# Patient Record
Sex: Male | Born: 1945
Health system: Southern US, Community
[De-identification: ages and names within clinical notes are randomized; demographics above are authoritative.]

## PROBLEM LIST (undated history)

## (undated) DIAGNOSIS — R0609 Other forms of dyspnea: Secondary | ICD-10-CM

## (undated) DIAGNOSIS — R011 Cardiac murmur, unspecified: Secondary | ICD-10-CM

## (undated) DIAGNOSIS — Z87442 Personal history of urinary calculi: Secondary | ICD-10-CM

## (undated) DIAGNOSIS — M1990 Unspecified osteoarthritis, unspecified site: Secondary | ICD-10-CM

## (undated) DIAGNOSIS — Z974 Presence of external hearing-aid: Secondary | ICD-10-CM

## (undated) DIAGNOSIS — R350 Frequency of micturition: Secondary | ICD-10-CM

## (undated) DIAGNOSIS — J849 Interstitial pulmonary disease, unspecified: Secondary | ICD-10-CM

## (undated) DIAGNOSIS — K5730 Diverticulosis of large intestine without perforation or abscess without bleeding: Secondary | ICD-10-CM

## (undated) DIAGNOSIS — Z9861 Coronary angioplasty status: Secondary | ICD-10-CM

## (undated) DIAGNOSIS — R3915 Urgency of urination: Secondary | ICD-10-CM

## (undated) DIAGNOSIS — K219 Gastro-esophageal reflux disease without esophagitis: Secondary | ICD-10-CM

## (undated) DIAGNOSIS — J984 Other disorders of lung: Secondary | ICD-10-CM

## (undated) DIAGNOSIS — I480 Paroxysmal atrial fibrillation: Secondary | ICD-10-CM

## (undated) DIAGNOSIS — D509 Iron deficiency anemia, unspecified: Secondary | ICD-10-CM

## (undated) DIAGNOSIS — Z9889 Other specified postprocedural states: Secondary | ICD-10-CM

## (undated) DIAGNOSIS — N201 Calculus of ureter: Secondary | ICD-10-CM

## (undated) DIAGNOSIS — G4733 Obstructive sleep apnea (adult) (pediatric): Secondary | ICD-10-CM

## (undated) DIAGNOSIS — Z8679 Personal history of other diseases of the circulatory system: Secondary | ICD-10-CM

## (undated) DIAGNOSIS — R112 Nausea with vomiting, unspecified: Secondary | ICD-10-CM

## (undated) DIAGNOSIS — Z86010 Personal history of colonic polyps: Secondary | ICD-10-CM

## (undated) DIAGNOSIS — I2510 Atherosclerotic heart disease of native coronary artery without angina pectoris: Secondary | ICD-10-CM

## (undated) DIAGNOSIS — E785 Hyperlipidemia, unspecified: Secondary | ICD-10-CM

## (undated) DIAGNOSIS — R0600 Dyspnea, unspecified: Secondary | ICD-10-CM

## (undated) DIAGNOSIS — Z955 Presence of coronary angioplasty implant and graft: Secondary | ICD-10-CM

## (undated) DIAGNOSIS — Z860101 Personal history of adenomatous and serrated colon polyps: Secondary | ICD-10-CM

## (undated) DIAGNOSIS — J302 Other seasonal allergic rhinitis: Secondary | ICD-10-CM

## (undated) DIAGNOSIS — I10 Essential (primary) hypertension: Secondary | ICD-10-CM

## (undated) DIAGNOSIS — Z87820 Personal history of traumatic brain injury: Secondary | ICD-10-CM

## (undated) DIAGNOSIS — L409 Psoriasis, unspecified: Secondary | ICD-10-CM

## (undated) DIAGNOSIS — K449 Diaphragmatic hernia without obstruction or gangrene: Secondary | ICD-10-CM

## (undated) DIAGNOSIS — H35379 Puckering of macula, unspecified eye: Secondary | ICD-10-CM

## (undated) DIAGNOSIS — H04123 Dry eye syndrome of bilateral lacrimal glands: Secondary | ICD-10-CM

## (undated) DIAGNOSIS — N200 Calculus of kidney: Secondary | ICD-10-CM

## (undated) DIAGNOSIS — I5032 Chronic diastolic (congestive) heart failure: Secondary | ICD-10-CM

## (undated) DIAGNOSIS — Z85828 Personal history of other malignant neoplasm of skin: Secondary | ICD-10-CM

## (undated) DIAGNOSIS — R413 Other amnesia: Secondary | ICD-10-CM

## (undated) DIAGNOSIS — E119 Type 2 diabetes mellitus without complications: Secondary | ICD-10-CM

## (undated) HISTORY — DX: Paroxysmal atrial fibrillation: I48.0

## (undated) HISTORY — DX: Coronary angioplasty status: Z98.61

## (undated) HISTORY — PX: CARPAL TUNNEL RELEASE: SHX101

## (undated) HISTORY — DX: Unspecified osteoarthritis, unspecified site: M19.90

## (undated) HISTORY — DX: Essential (primary) hypertension: I10

## (undated) HISTORY — PX: CARDIAC CATHETERIZATION: SHX172

## (undated) HISTORY — DX: Hyperlipidemia, unspecified: E78.5

## (undated) HISTORY — PX: NASAL SINUS SURGERY: SHX719

## (undated) HISTORY — DX: Atherosclerotic heart disease of native coronary artery without angina pectoris: I25.10

## (undated) HISTORY — DX: Psoriasis, unspecified: L40.9

## (undated) HISTORY — PX: CATARACT EXTRACTION W/ INTRAOCULAR LENS  IMPLANT, BILATERAL: SHX1307

## (undated) HISTORY — DX: Diaphragmatic hernia without obstruction or gangrene: K44.9

## (undated) HISTORY — PX: ROTATOR CUFF REPAIR: SHX139

## (undated) HISTORY — DX: Gastro-esophageal reflux disease without esophagitis: K21.9

## (undated) HISTORY — DX: Dyspnea, unspecified: R06.00

## (undated) HISTORY — PX: CARDIOVASCULAR STRESS TEST: SHX262

## (undated) HISTORY — DX: Cardiac murmur, unspecified: R01.1

## (undated) HISTORY — PX: OTHER SURGICAL HISTORY: SHX169

## (undated) HISTORY — PX: TONSILLECTOMY: SUR1361

## (undated) HISTORY — PX: PROXIMAL INTERPHALANGEAL FUSION (PIP): SHX6043

---
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
## 1967-04-09 DIAGNOSIS — L400 Psoriasis vulgaris: Secondary | ICD-10-CM

## 1967-04-09 HISTORY — DX: Psoriasis vulgaris: L40.0

## 1994-04-08 HISTORY — PX: APPENDECTOMY: SHX54

## 1997-04-08 DIAGNOSIS — K449 Diaphragmatic hernia without obstruction or gangrene: Secondary | ICD-10-CM

## 1997-04-08 HISTORY — DX: Diaphragmatic hernia without obstruction or gangrene: K44.9

## 2001-01-05 ENCOUNTER — Ambulatory Visit (HOSPITAL_BASED_OUTPATIENT_CLINIC_OR_DEPARTMENT_OTHER): Admission: RE | Admit: 2001-01-05 | Discharge: 2001-01-05 | Payer: Self-pay | Admitting: Orthopedic Surgery

## 2001-02-26 ENCOUNTER — Ambulatory Visit (HOSPITAL_COMMUNITY): Admission: RE | Admit: 2001-02-26 | Discharge: 2001-02-26 | Payer: Self-pay | Admitting: Orthopedic Surgery

## 2001-02-27 ENCOUNTER — Emergency Department (HOSPITAL_COMMUNITY): Admission: EM | Admit: 2001-02-27 | Discharge: 2001-02-27 | Payer: Self-pay | Admitting: Emergency Medicine

## 2001-02-27 ENCOUNTER — Encounter: Payer: Self-pay | Admitting: Emergency Medicine

## 2003-06-30 ENCOUNTER — Ambulatory Visit (HOSPITAL_COMMUNITY): Admission: RE | Admit: 2003-06-30 | Discharge: 2003-06-30 | Payer: Self-pay | Admitting: Internal Medicine

## 2003-06-30 HISTORY — PX: LEFT HEART CATH AND CORONARY ANGIOGRAPHY: CATH118249

## 2003-11-07 ENCOUNTER — Inpatient Hospital Stay (HOSPITAL_COMMUNITY): Admission: AD | Admit: 2003-11-07 | Discharge: 2003-11-10 | Payer: Self-pay | Admitting: Cardiology

## 2004-02-09 ENCOUNTER — Ambulatory Visit: Payer: Self-pay | Admitting: Cardiology

## 2004-02-15 ENCOUNTER — Ambulatory Visit: Payer: Self-pay | Admitting: Family Medicine

## 2004-02-24 ENCOUNTER — Ambulatory Visit: Payer: Self-pay | Admitting: Cardiology

## 2004-03-26 ENCOUNTER — Ambulatory Visit: Payer: Self-pay | Admitting: Cardiology

## 2004-04-06 ENCOUNTER — Ambulatory Visit: Payer: Self-pay | Admitting: Family Medicine

## 2004-04-23 ENCOUNTER — Ambulatory Visit: Payer: Self-pay | Admitting: *Deleted

## 2004-06-29 ENCOUNTER — Ambulatory Visit: Payer: Self-pay | Admitting: Cardiology

## 2004-07-27 ENCOUNTER — Ambulatory Visit: Payer: Self-pay | Admitting: Cardiology

## 2004-08-02 ENCOUNTER — Ambulatory Visit: Payer: Self-pay | Admitting: Cardiology

## 2004-08-06 HISTORY — PX: ATRIAL FIBRILLATION ABLATION: EP1191

## 2004-08-10 ENCOUNTER — Ambulatory Visit: Payer: Self-pay | Admitting: Cardiovascular Disease

## 2004-08-17 ENCOUNTER — Ambulatory Visit: Payer: Self-pay | Admitting: Cardiology

## 2004-08-20 ENCOUNTER — Ambulatory Visit: Payer: Self-pay | Admitting: Cardiology

## 2004-08-22 ENCOUNTER — Ambulatory Visit: Payer: Self-pay | Admitting: Cardiology

## 2004-08-29 ENCOUNTER — Ambulatory Visit: Payer: Self-pay

## 2004-08-29 ENCOUNTER — Ambulatory Visit: Payer: Self-pay | Admitting: *Deleted

## 2004-09-19 ENCOUNTER — Ambulatory Visit: Payer: Self-pay | Admitting: *Deleted

## 2004-10-17 ENCOUNTER — Ambulatory Visit: Payer: Self-pay | Admitting: Cardiology

## 2004-11-07 ENCOUNTER — Ambulatory Visit: Payer: Self-pay | Admitting: Cardiovascular Disease

## 2004-11-21 ENCOUNTER — Ambulatory Visit: Payer: Self-pay | Admitting: Cardiology

## 2004-11-26 ENCOUNTER — Ambulatory Visit: Payer: Self-pay | Admitting: Internal Medicine

## 2004-12-17 ENCOUNTER — Ambulatory Visit: Payer: Self-pay | Admitting: Internal Medicine

## 2005-03-04 ENCOUNTER — Ambulatory Visit: Payer: Self-pay | Admitting: Gastroenterology

## 2005-03-19 ENCOUNTER — Encounter (INDEPENDENT_AMBULATORY_CARE_PROVIDER_SITE_OTHER): Payer: Self-pay | Admitting: *Deleted

## 2005-03-19 ENCOUNTER — Ambulatory Visit: Payer: Self-pay | Admitting: Gastroenterology

## 2006-03-18 ENCOUNTER — Ambulatory Visit: Payer: Self-pay | Admitting: Internal Medicine

## 2006-04-22 ENCOUNTER — Encounter: Admission: RE | Admit: 2006-04-22 | Discharge: 2006-04-22 | Payer: Self-pay | Admitting: Internal Medicine

## 2006-04-22 ENCOUNTER — Ambulatory Visit: Payer: Self-pay | Admitting: Internal Medicine

## 2006-04-22 LAB — CONVERTED CEMR LAB
AST: 22 units/L (ref 0–37)
BUN: 14 mg/dL (ref 6–23)
Basophils Absolute: 0.1 10*3/uL (ref 0.0–0.1)
CO2: 27 meq/L (ref 19–32)
Calcium: 9.6 mg/dL (ref 8.4–10.5)
Chloride: 101 meq/L (ref 96–112)
Creatinine, Ser: 0.9 mg/dL (ref 0.4–1.5)
Eosinophils Relative: 3.3 % (ref 0.0–5.0)
HCT: 42 % (ref 39.0–52.0)
Hemoglobin: 14 g/dL (ref 13.0–17.0)
Hgb A1c MFr Bld: 5.8 % (ref 4.6–6.0)
Lymphocytes Relative: 19.7 % (ref 12.0–46.0)
MCHC: 33.4 g/dL (ref 30.0–36.0)
MCV: 83.9 fL (ref 78.0–100.0)
Monocytes Relative: 6.9 % (ref 3.0–11.0)
Neutro Abs: 4 10*3/uL (ref 1.4–7.7)
Neutrophils Relative %: 69.1 % (ref 43.0–77.0)
RDW: 13 % (ref 11.5–14.6)
TSH: 1.3 microintl units/mL (ref 0.35–5.50)

## 2006-11-25 ENCOUNTER — Ambulatory Visit: Payer: Self-pay | Admitting: Cardiology

## 2006-12-03 ENCOUNTER — Ambulatory Visit: Payer: Self-pay

## 2006-12-03 ENCOUNTER — Encounter: Payer: Self-pay | Admitting: Internal Medicine

## 2007-06-29 ENCOUNTER — Ambulatory Visit: Payer: Self-pay | Admitting: Family Medicine

## 2007-06-29 DIAGNOSIS — J1100 Influenza due to unidentified influenza virus with unspecified type of pneumonia: Secondary | ICD-10-CM

## 2007-06-29 LAB — CONVERTED CEMR LAB: Inflenza A Ag: POSITIVE

## 2007-06-30 ENCOUNTER — Ambulatory Visit: Payer: Self-pay | Admitting: Family Medicine

## 2007-07-06 ENCOUNTER — Ambulatory Visit: Payer: Self-pay | Admitting: Family Medicine

## 2007-07-06 DIAGNOSIS — R0989 Other specified symptoms and signs involving the circulatory and respiratory systems: Secondary | ICD-10-CM

## 2007-07-06 DIAGNOSIS — I480 Paroxysmal atrial fibrillation: Secondary | ICD-10-CM | POA: Insufficient documentation

## 2007-07-06 DIAGNOSIS — I4891 Unspecified atrial fibrillation: Secondary | ICD-10-CM

## 2007-07-06 DIAGNOSIS — R0609 Other forms of dyspnea: Secondary | ICD-10-CM

## 2007-07-10 ENCOUNTER — Encounter (INDEPENDENT_AMBULATORY_CARE_PROVIDER_SITE_OTHER): Payer: Self-pay | Admitting: *Deleted

## 2007-07-15 ENCOUNTER — Ambulatory Visit: Payer: Self-pay | Admitting: Internal Medicine

## 2007-07-15 DIAGNOSIS — J45909 Unspecified asthma, uncomplicated: Secondary | ICD-10-CM | POA: Insufficient documentation

## 2007-07-23 DIAGNOSIS — R0602 Shortness of breath: Secondary | ICD-10-CM | POA: Insufficient documentation

## 2007-07-24 ENCOUNTER — Ambulatory Visit: Payer: Self-pay | Admitting: Pulmonary Disease

## 2007-07-24 DIAGNOSIS — J309 Allergic rhinitis, unspecified: Secondary | ICD-10-CM

## 2007-07-27 LAB — CONVERTED CEMR LAB
Basophils Relative: 0.5 % (ref 0.0–1.0)
Eosinophils Relative: 1.8 % (ref 0.0–5.0)
HCT: 40.2 % (ref 39.0–52.0)
Monocytes Absolute: 0.5 10*3/uL (ref 0.1–1.0)
Monocytes Relative: 8.2 % (ref 3.0–12.0)
Neutrophils Relative %: 64.5 % (ref 43.0–77.0)
Platelets: 179 10*3/uL (ref 150–400)
RBC: 4.74 M/uL (ref 4.22–5.81)
WBC: 6.6 10*3/uL (ref 4.5–10.5)

## 2007-08-13 ENCOUNTER — Ambulatory Visit: Payer: Self-pay | Admitting: Pulmonary Disease

## 2007-08-21 ENCOUNTER — Telehealth (INDEPENDENT_AMBULATORY_CARE_PROVIDER_SITE_OTHER): Payer: Self-pay | Admitting: *Deleted

## 2007-08-24 ENCOUNTER — Telehealth (INDEPENDENT_AMBULATORY_CARE_PROVIDER_SITE_OTHER): Payer: Self-pay | Admitting: *Deleted

## 2007-09-03 ENCOUNTER — Ambulatory Visit: Payer: Self-pay | Admitting: Pulmonary Disease

## 2007-09-10 ENCOUNTER — Ambulatory Visit: Payer: Self-pay | Admitting: Internal Medicine

## 2007-09-10 ENCOUNTER — Emergency Department (HOSPITAL_COMMUNITY): Admission: EM | Admit: 2007-09-10 | Discharge: 2007-09-10 | Payer: Self-pay | Admitting: Emergency Medicine

## 2007-09-10 ENCOUNTER — Ambulatory Visit (HOSPITAL_COMMUNITY): Admission: RE | Admit: 2007-09-10 | Discharge: 2007-09-11 | Payer: Self-pay | Admitting: Urology

## 2007-09-14 ENCOUNTER — Encounter: Payer: Self-pay | Admitting: Internal Medicine

## 2007-10-02 ENCOUNTER — Ambulatory Visit: Payer: Self-pay | Admitting: Pulmonary Disease

## 2007-10-02 DIAGNOSIS — J8409 Other alveolar and parieto-alveolar conditions: Secondary | ICD-10-CM | POA: Insufficient documentation

## 2007-10-07 ENCOUNTER — Encounter: Payer: Self-pay | Admitting: Pulmonary Disease

## 2007-10-07 ENCOUNTER — Ambulatory Visit: Payer: Self-pay | Admitting: Cardiology

## 2007-10-07 DIAGNOSIS — G4733 Obstructive sleep apnea (adult) (pediatric): Secondary | ICD-10-CM

## 2007-10-19 ENCOUNTER — Encounter: Payer: Self-pay | Admitting: Internal Medicine

## 2007-11-24 ENCOUNTER — Ambulatory Visit (HOSPITAL_BASED_OUTPATIENT_CLINIC_OR_DEPARTMENT_OTHER): Admission: RE | Admit: 2007-11-24 | Discharge: 2007-11-24 | Payer: Self-pay | Admitting: Pulmonary Disease

## 2007-11-24 ENCOUNTER — Encounter: Payer: Self-pay | Admitting: Pulmonary Disease

## 2007-11-27 ENCOUNTER — Ambulatory Visit: Payer: Self-pay | Admitting: Pulmonary Disease

## 2007-12-01 ENCOUNTER — Encounter: Payer: Self-pay | Admitting: Pulmonary Disease

## 2007-12-16 ENCOUNTER — Ambulatory Visit: Payer: Self-pay | Admitting: Pulmonary Disease

## 2007-12-16 ENCOUNTER — Telehealth (INDEPENDENT_AMBULATORY_CARE_PROVIDER_SITE_OTHER): Payer: Self-pay | Admitting: *Deleted

## 2007-12-16 DIAGNOSIS — G4700 Insomnia, unspecified: Secondary | ICD-10-CM | POA: Insufficient documentation

## 2008-01-21 ENCOUNTER — Telehealth (INDEPENDENT_AMBULATORY_CARE_PROVIDER_SITE_OTHER): Payer: Self-pay | Admitting: *Deleted

## 2008-02-02 ENCOUNTER — Ambulatory Visit: Payer: Self-pay | Admitting: Internal Medicine

## 2008-02-02 DIAGNOSIS — Z87442 Personal history of urinary calculi: Secondary | ICD-10-CM

## 2008-02-03 ENCOUNTER — Telehealth (INDEPENDENT_AMBULATORY_CARE_PROVIDER_SITE_OTHER): Payer: Self-pay | Admitting: *Deleted

## 2008-02-03 ENCOUNTER — Ambulatory Visit: Payer: Self-pay | Admitting: Internal Medicine

## 2008-02-24 ENCOUNTER — Ambulatory Visit: Payer: Self-pay | Admitting: Internal Medicine

## 2008-03-18 ENCOUNTER — Ambulatory Visit: Payer: Self-pay | Admitting: Pulmonary Disease

## 2008-04-08 HISTORY — PX: CYSTOSCOPY W/ STONE MANIPULATION: SHX1427

## 2008-06-23 ENCOUNTER — Encounter: Payer: Self-pay | Admitting: Internal Medicine

## 2008-08-17 ENCOUNTER — Encounter: Payer: Self-pay | Admitting: Internal Medicine

## 2008-08-18 ENCOUNTER — Ambulatory Visit (HOSPITAL_COMMUNITY): Admission: RE | Admit: 2008-08-18 | Discharge: 2008-08-18 | Payer: Self-pay | Admitting: Urology

## 2008-08-18 ENCOUNTER — Encounter: Payer: Self-pay | Admitting: Urology

## 2009-01-12 ENCOUNTER — Ambulatory Visit: Payer: Self-pay | Admitting: Family Medicine

## 2009-02-21 ENCOUNTER — Ambulatory Visit: Payer: Self-pay | Admitting: Family

## 2009-03-28 ENCOUNTER — Ambulatory Visit: Payer: Self-pay | Admitting: Internal Medicine

## 2009-03-28 DIAGNOSIS — R079 Chest pain, unspecified: Secondary | ICD-10-CM

## 2009-03-28 DIAGNOSIS — N6019 Diffuse cystic mastopathy of unspecified breast: Secondary | ICD-10-CM

## 2009-05-02 ENCOUNTER — Ambulatory Visit: Payer: Self-pay | Admitting: Family

## 2009-05-02 DIAGNOSIS — J329 Chronic sinusitis, unspecified: Secondary | ICD-10-CM | POA: Insufficient documentation

## 2009-08-17 ENCOUNTER — Ambulatory Visit: Payer: Self-pay | Admitting: Internal Medicine

## 2009-08-17 ENCOUNTER — Inpatient Hospital Stay (HOSPITAL_COMMUNITY): Admission: EM | Admit: 2009-08-17 | Discharge: 2009-08-17 | Payer: Self-pay | Admitting: Emergency Medicine

## 2009-08-17 ENCOUNTER — Encounter (INDEPENDENT_AMBULATORY_CARE_PROVIDER_SITE_OTHER): Payer: Self-pay | Admitting: Internal Medicine

## 2009-08-17 ENCOUNTER — Encounter: Payer: Self-pay | Admitting: Internal Medicine

## 2009-08-24 ENCOUNTER — Ambulatory Visit: Payer: Self-pay | Admitting: Internal Medicine

## 2009-11-16 ENCOUNTER — Ambulatory Visit: Payer: Self-pay | Admitting: Internal Medicine

## 2009-11-16 DIAGNOSIS — J45901 Unspecified asthma with (acute) exacerbation: Secondary | ICD-10-CM

## 2010-03-20 ENCOUNTER — Encounter: Payer: Self-pay | Admitting: Gastroenterology

## 2010-03-21 ENCOUNTER — Ambulatory Visit: Payer: Self-pay | Admitting: Internal Medicine

## 2010-05-08 NOTE — Assessment & Plan Note (Signed)
Summary: ? Sinus- jr   Vital Signs:  Patient profile:   65 year old male Weight:      262 pounds Temp:     97.8 degrees F oral BP sitting:   160 / 90  Vitals Entered By: Kandice Hams (May 02, 2009 3:01 PM) CC: c/o sinus drainage,cough sore throat   Primary Care Provider:  Alwyn [name]  CC:  c/o sinus drainage and cough sore throat.  History of Present Illness: Kenneth [name] is a 65 year old male who presents with c/o sinus drainage which started 2 days ago.  Got progressively worse last night.  Now developing a sore throat.  Took echinacea and Nyquil gel tabslast night without improvement.  Notes that he has an upcoming trip planned to go to [place] on Thursday morning if he is feeling better.    Allergies: No Known Drug Allergies  Review of Systems       Denies fever,  nasal drainage is clear, chest congestion is brown- which he say is normal for him.  + cough.  + hoarseness  Physical Exam  General:  Well-developed,well-nourished,in no acute distress; alert,appropriate and cooperative throughout examination Head:  Normocephalic and atraumatic without obvious abnormalities. No apparent alopecia or balding. Eyes:  PERRLA Ears:  External ear exam shows no significant lesions or deformities.  Otoscopic examination reveals clear canals, tympanic membranes are intact bilaterally without bulging, retraction, inflammation or discharge. Hearing is grossly normal bilaterally. Mouth:  mild pharyngeal erythema.  No exudate Lungs:  Normal respiratory effort, chest expands symmetrically. Lungs are clear to auscultation, no crackles or wheezes. Heart:  Normal rate and regular rhythm. S1 and S2 normal without gallop, murmur, click, rub or other extra sounds.   Impression & Recommendations:  Problem # 1:  SINUSITIS (ICD-473.9) Will treat with amoxicillin. Pt instructed  to call if he develops fever over 101, increasing sinus pressure, pain with eye movement, increased facial tenderness of  swelling, or if you develop visual changes.  Complete Medication List: 1)  Prevacid 30 Mg Cpdr (Lansoprazole) .... Take one capsule daily as needed 2)  Tylenol  .... Take by mouth as needed 3)  Trazodone Hcl 50 Mg Tabs (Trazodone hcl) .Marland Kitchen.. 1-2 at bedtime 4)  Zyrtec Allergy 10 Mg Tabs (Cetirizine hcl) .Marland Kitchen.. 1 by mouth once daily 5)  Symbicort 160-4.5 Mcg/act Aero (Budesonide-formoterol fumarate) .... 2 puffs every 12 hrs 6)  Proventil Hfa 108 (90 Base) Mcg/act Aers (Albuterol sulfate) .Marland Kitchen.. 1-2 puffs q 4hrs prn 7)  Fluticasone Propionate 50 Mcg/act Susp (Fluticasone propionate) .... 2 sprays each nostril once daily 8)  Zyrtec Allergy 10 Mg Tabs (Cetirizine hcl) .Marland Kitchen.. 1 tab per day po 9)  Hydrocodone-acetaminophen 5-500 Mg Tabs (Hydrocodone-acetaminophen) .Marland Kitchen.. 1tab by mouth 3 times daily 10)  Acyclovir 800 Mg Tabs (Acyclovir) .Marland Kitchen.. 1 pill 5 x/day x 7 days 11)  Gabapentin 100 Mg Caps (Gabapentin) .Marland Kitchen.. 1 q 8 hrs as needed burning pain 12)  Amoxicillin 500 Mg Cap (Amoxicillin) .... Take 1 capsule by mouth three times a day x 10 days  Patient Instructions: 1)  Call if you develop fever over 101, increasing sinus pressure, pain with eye movement, increased facial tenderness of swelling, or if you develop visual changes. 2)  Take 650-1000mg  of Tylenol every 4-6 hours as needed for relief of pain or comfort of fever AVOID taking more than 4000mg   in a 24 hour period (can cause liver damage in higher doses). 3)  Continue to use a Neti pot as your are.  Prescriptions: AMOXICILLIN 500 MG CAP (AMOXICILLIN) Take 1 capsule by mouth three times a day X 10 days  #30 x 0   Entered and Authorized by:   Lemont Fillers FNP   Signed by:   Lemont Fillers FNP on 05/02/2009   Method used:   Electronically to        [facility]* (retail)       [address] [place], [place]  [address]       Ph: [phone]       Fax: [phone]   RxID:    [id number]   Prevention & Chronic Care Immunizations   Influenza vaccine: Fluvax 3+  (01/12/2009)    Tetanus booster: Not documented    Pneumococcal vaccine: Not documented    H. zoster vaccine: Not documented  Colorectal Screening   Hemoccult: Not documented    Colonoscopy: Not documented  Other Screening   PSA: 1.18  (04/22/2006)   Smoking status: quit  (07/24/2007)  Lipids   Total Cholesterol: Not documented   LDL: Not documented   LDL Direct: Not documented   HDL: Not documented   Triglycerides: Not documented  Hypertension   Last Blood Pressure: 160 / 90  (05/02/2009)   Serum creatinine: 0.9  (04/22/2006)   Serum potassium 4.3  (04/22/2006)  Self-Management Support :    Hypertension self-management support: Not documented

## 2010-05-08 NOTE — Assessment & Plan Note (Signed)
Summary: eph/jml   Referring Provider:  Loreen Freud Primary Provider:  Alwyn Ren  CC:  eph/afib.  Pt states he is feeling better.  Kenneth [name]  History of Present Illness: [name] is seen after a hiatus of many years because of recurrence of atrial fibrillation. He underwent ablation at [facility] 2006 and has had no recurrences until May when he presented to the emergency room in atrial fibrillation. He spontaneously converted to sinus rhythm and was discharged. His aspirin dose was increased as he is a CHADS VASC score of one.  intercurrently he has had an echo which demonstrated Left ventricle: Systolic function was normal. The estimated     ejection fraction was in the range of 60% to 65%. Left ventricular     diastolic function parameters were normal.      Nonobstructive per cardiac catheterization, 2005.    Current Medications (verified): 1)  Prevacid 30 Mg  Cpdr (Lansoprazole) .... Take One Capsule Daily As Needed 2)  Tylenol .... Take By Mouth As Needed 3)  Zyrtec Allergy 10 Mg Tabs (Cetirizine Hcl) .Kenneth [name].. 1 By Mouth Once Daily 4)  Bayer Aspirin 325 Mg Tabs (Aspirin) .... Once Daily 5)  Ambien 10 Mg Tabs (Zolpidem Tartrate) .... Use As Needed For Sleep 6)  Diltiazem Hcl Er Beads 180 Mg Xr24h-Cap (Diltiazem Hcl Er Beads) .... Take One Tablet Once Daily  Allergies (verified): No Known Drug Allergies  Past History:  Past Surgical History: Last updated: 08/23/2009 Rotator cuff repair Nov. 5, 2010 Atrial fibrillation ablation in [place] by Dr. Ronn Melena cardiac catheterization performed in March of 2005  Family History: Last updated: 08/24/2009  PGF-CVA,CAD m-CAD, CA breast and colon MG-MI  Social History: Last updated: 07/24/2007 Patient states former smoker.  pt is married. pt works in Air[employer].   Past Medical History:  A-Fib-427.31 HBP-401.1 hx rheumatodid fever 390.0 psoriasis 696.1 AR-472.9 Dyspepsia 536.8 Deafness Kidney stone stress card  08-23-2000 hypertriglyceridemia macular degeneration-L  Family History:  PGF-CVA,CAD m-CAD, CA breast and colon MG-MI  Review of Systems       full review of systems was negative apart from a history of present illness and past medical history.   Vital Signs:  Patient profile:   65 year old male Height:      75 inches Weight:      264 pounds BMI:     33.12 Pulse rate:   76 / minute Pulse rhythm:   regular BP sitting:   150 / 82  (left arm) Cuff size:   large  Vitals Entered By: Judithe Modest CMA (Aug 24, 2009 1:10 PM)  Physical Exam  General:  Well-developed,well-nourished older Caucasian male appearing his stated age ,in no acute distress; alert,appropriate and cooperative throughout examination Head:  HEENT normal Neck:  double without thyromegaly Chest Wall:  without CVA tenderness Lungs:  clear Heart:  regular S1 and S2 positive S4 no murmur Abdomen:  soft nontender without hepatomegaly;  active bowel sounds Msk:  Back normal, normal gait. Muscle strength and tone normal. Pulses:  intact distalpulses Extremities:   without clubbing cyanosis or edema Neurologic:  alert and oriented grossly normal motor and sensory function Skin:  without rashes Cervical Nodes:  no adenopathy Psych:  normal affect   EKG  Procedure date:  08/24/2009  Findings:      sinus rhythm at 76 Intervals 0.16/2009/0.40 Axis XLVIII otherwise his  Impression & Recommendations:  Problem # 1:  ATRIAL FIBRILLATION (ICD-427.31) the patient has paroxysmal atrial fibrillation now 5 years post ablation.  The likelihood of recurrence is high the frequency of that recurrence however is not clear. Currently he has a CHADS VASC score of one (and possibly 2 because of nonobstructive coronary disease) I think is reasonable for now to treat with aspirin.  Given the unspecified frequency of events, will plan to stop his diltiazem and he will use it on an as-needed basis. He will get back up in touch  with [place] if he has recurrent episodes of atrial fibrillation at which time we would reconsider repeat ablation His updated medication list for this problem includes:    Bayer Aspirin 325 Mg Tabs (Aspirin) ..... Once daily  Problem # 2:  OBSTRUCTIVE SLEEP APNEA (ICD-327.23) fairly history that it was of insufficient severity to justify therapy  Problem # 3:  HYPERTENSION (ICD-401.9) hypertension is is borderline;;  I would be inclined to add an ACE inhibitor for blood pressure control but rather I think we'll continue his diltiazem as it may help as accomplished both. His updated medication list for this problem includes:    Bayer Aspirin 325 Mg Tabs (Aspirin) ..... Once daily    Diltiazem Hcl Er Beads 180 Mg Xr24h-cap (Diltiazem hcl er beads) .Kenneth [name]... Take one tablet once daily  Other Orders: EKG w/ Interpretation (93000)

## 2010-05-08 NOTE — Assessment & Plan Note (Signed)
Summary: ASTHMA/KN   Vital Signs:  Patient profile:   65 year old male Weight:      270.6 pounds O2 Sat:      98 % Temp:     98.0 degrees F oral Pulse rate:   88 / minute Resp:     18 per minute BP sitting:   140 / 70  (left arm) Cuff size:   large  Vitals Entered By: Shonna Chock CMA (November 16, 2009 4:13 PM) CC: Asthma, Cough   Primary Care Provider:  Alwyn Ren  CC:  Asthma and Cough.  History of Present Illness: Cough      This is a 65 year old man who presents with cough as of last  night.  The patient reports  mainly non-productive cough except @ night & early am when he produces some  brown  plugs. He also has shortness of breath, wheezing, and malaise, but denies pleuritic chest pain, fever, and hemoptysis.  Associated symtpoms include cold/URI symptoms and nasal congestion.  The patient denies the following symptoms: sore throat and acid reflux symptoms.  The cough is worse with exercise and lying down. Partially effective prior treatments have included other asthma medication, Symbicort.  Risk factors include history of asthma and history of reflux.    Allergies (verified): No Known Drug Allergies  Review of Systems General:  Denies chills, fever, and sweats. ENT:  Frontal headache , facial pain & some purulence. CV:  Complains of difficulty breathing at night, difficulty breathing while lying down, and swelling of hands; denies bluish discoloration of lips or nails, chest pain or discomfort, and swelling of feet. Allergy:  Complains of sneezing; denies itching eyes.  Physical Exam  General:  well-nourished,in no acute distress; alert,appropriate and cooperative throughout examination Ears:  TMs scarred; aids bilarerally Nose:  External nasal examination shows no deformity or inflammation. Nasal mucosa are pink and moist without lesions or exudates. Septal deviation Mouth:  Oral mucosa and oropharynx without lesions or exudates.  Teeth in good repair. Lungs:  Normal  respiratory effort, chest expands symmetrically. Lungs : mild crackles  w/o wheezes. Paroxysmal cough Heart:  normal rate, regular rhythm, no murmur, no gallop, no rub, no JVD, and no HJR.  S4 Abdomen:  Bowel sounds positive,abdomen soft and non-tender without masses, organomegaly or hernias noted. Pulses:  R and L carotid,radial,dorsalis pedis and posterior tibial pulses are full and equal bilaterally Extremities:  No clubbing, cyanosis, edema. Neurologic:  alert & oriented X3.   Skin:  Scattered Psoriatic lesions Cervical Nodes:  No lymphadenopathy noted Axillary Nodes:  No palpable lymphadenopathy Psych:  memory intact for recent and remote, normally interactive, and good eye contact.     Impression & Recommendations:  Problem # 1:  ASTHMA NOS W/ACUTE EXACERBATION (ICD-493.92)  His updated medication list for this problem includes:    Prednisone 20 Mg Tabs (Prednisone) .Marland Kitchen... 1 two times a day with a meal    Symbicort 80-4.5 Mcg/act Aero (Budesonide-formoterol fumarate) .Marland Kitchen... 1-2 puffs every 12 hrs ; gargle & spit after use    Ventolin Hfa 108 (90 Base) Mcg/act Aers (Albuterol sulfate) .Marland Kitchen... 1-2 puffs every 4 hrs as needed for cough or shortness of breath    Singulair 10 Mg Tabs (Montelukast sodium) .Marland Kitchen... 1 once daily until cogh gone  Problem # 2:  BRONCHITIS-ACUTE (ICD-466.0)  His updated medication list for this problem includes:    Amoxicillin 500 Mg Caps (Amoxicillin) .Marland Kitchen... 1 three times a day    Symbicort 80-4.5 Mcg/act  Aero (Budesonide-formoterol fumarate) .Marland Kitchen... 1-2 puffs every 12 hrs ; gargle & spit after use    Ventolin Hfa 108 (90 Base) Mcg/act Aers (Albuterol sulfate) .Marland Kitchen... 1-2 puffs every 4 hrs as needed for cough or shortness of breath    Singulair 10 Mg Tabs (Montelukast sodium) .Marland Kitchen... 1 once daily until cogh gone  Problem # 3:  URI (ICD-465.9)  His updated medication list for this problem includes:    Zyrtec Allergy 10 Mg Tabs (Cetirizine hcl) .Marland Kitchen... 1 by mouth once  daily    Bayer Aspirin 325 Mg Tabs (Aspirin) ..... Once daily  Complete Medication List: 1)  Prevacid 30 Mg Cpdr (Lansoprazole) .... Take one capsule daily as needed 2)  Tylenol  .... Take by mouth as needed 3)  Zyrtec Allergy 10 Mg Tabs (Cetirizine hcl) .Marland Kitchen.. 1 by mouth once daily 4)  Bayer Aspirin 325 Mg Tabs (Aspirin) .... Once daily 5)  Amoxicillin 500 Mg Caps (Amoxicillin) .Marland Kitchen.. 1 three times a day 6)  Prednisone 20 Mg Tabs (Prednisone) .Marland Kitchen.. 1 two times a day with a meal 7)  Symbicort 80-4.5 Mcg/act Aero (Budesonide-formoterol fumarate) .Marland Kitchen.. 1-2 puffs every 12 hrs ; gargle & spit after use 8)  Ventolin Hfa 108 (90 Base) Mcg/act Aers (Albuterol sulfate) .Marland Kitchen.. 1-2 puffs every 4 hrs as needed for cough or shortness of breath 9)  Singulair 10 Mg Tabs (Montelukast sodium) .Marland Kitchen.. 1 once daily until cogh gone  Patient Instructions: 1)  Drink as much fluid as you can tolerate for the next few days. Meds as directed Prescriptions: SINGULAIR 10 MG TABS (MONTELUKAST SODIUM) 1 once daily until cogh gone  #14 x 0   Entered and Authorized by:   Marga Melnick MD   Signed by:   Marga Melnick MD on 11/18/2009   Method used:   Samples Given   RxID:   [id number] VENTOLIN HFA 108 (90 BASE) MCG/ACT AERS (ALBUTEROL SULFATE) 1-2 puffs every 4 hrs as needed for cough or shortness of breath  #1 x 2   Entered and Authorized by:   Marga Melnick MD   Signed by:   Marga Melnick MD on 11/16/2009   Method used:   Faxed to ...       [facility]* (retail)       [address] [place], [place]  [address]       Ph: [phone]       Fax: [phone]   RxID:   [id number] SYMBICORT 80-4.5 MCG/ACT AERO (BUDESONIDE-FORMOTEROL FUMARATE) 1-2 puffs every 12 hrs ; gargle & spit after use  #1 x 11   Entered and Authorized by:   Marga Melnick MD   Signed by:   Marga Melnick MD on 11/16/2009   Method used:   Faxed to ...       [facility]* (retail)       [address] [place], [place]  [address]       Ph: [phone]       Fax: [phone]   RxID:   [id number] PREDNISONE 20 MG TABS (PREDNISONE) 1 two times a day with a meal  #10 x 0   Entered and Authorized by:   Marga Melnick MD   Signed by:   Marga Melnick MD on 11/16/2009   Method used:   Faxed to .Marland KitchenMarland Kitchen  [facility]* (retail)       [address] [place], [place]  [address]       Ph: [phone]       Fax: [phone]   RxID:   [id number] AMOXICILLIN 500 MG CAPS (AMOXICILLIN) 1 three times a day  #30 x 0   Entered and Authorized by:   Marga Melnick MD   Signed by:   Marga Melnick MD on 11/16/2009   Method used:   Faxed to ...       [facility]* (retail)       [address] [place], [place]  [address]       Ph: [phone]       Fax: [phone]   RxID:   [id number]

## 2010-05-10 NOTE — Letter (Signed)
Summary: Colonoscopy Letter  [facility]  [address]   [place], [place] [address]   Phone: [phone]  Fax: [phone]      March 20, 2010 MRN: [id number]   [facility] [address] [place], [place]  [address]   Dear Mr. Budhu,   According to your medical record, it is time for you to schedule a Colonoscopy. The American Cancer Society recommends this procedure as a method to detect early colon cancer. Patients with a family history of colon cancer, or a personal history of colon polyps or inflammatory bowel disease are at increased risk.  This letter has been generated based on the recommendations made at the time of your procedure. If you feel that in your particular situation this may no longer apply, please contact our office.  Please call our office at [phone] to schedule this appointment or to update your records at your earliest convenience.  Thank you for cooperating with [place] to provide you with the very best care possible.   Sincerely,   Claudette Head, M.D.  [facility] [phone]

## 2010-05-10 NOTE — Assessment & Plan Note (Signed)
Summary: congested.cough/cbs   Vital Signs:  Patient profile:   65 year old male Weight:      268.2 pounds BMI:     33.64 Temp:     98.5 degrees F oral Pulse rate:   84 / minute Resp:     14 per minute BP sitting:   140 / 70  (left arm) Cuff size:   large  Vitals Entered By: Shonna Chock CMA (March 21, 2010 1:18 PM) CC: Cough and congestion x 4-5 days (discolored drainage), URI symptoms   Primary Care Provider:  Alwyn Ren  CC:  Cough and congestion x 4-5 days (discolored drainage) and URI symptoms.  History of Present Illness:      This is a 65 year old man who presents with RTI  symptoms X 4 days, onset as rhinitis.  The patient now reports nasal congestion, purulent nasal discharge, sore throat, productive cough, and earache.  Associated symptoms include dyspnea.  The patient denies fever and wheezing.  The patient also reports headache & bilateral facial pain and tooth pain.  The patient denies the following risk factors for Strep sinusitis: tender adenopathy.  Rx: Neti pot , Tylenol, Nyquil.  Current Medications (verified): 1)  Prevacid 30 Mg  Cpdr (Lansoprazole) .... Take One Capsule Daily As Needed 2)  Tylenol .... Take By Mouth As Needed 3)  Zyrtec Allergy 10 Mg Tabs (Cetirizine Hcl) .Marland Kitchen.. 1 By Mouth Once Daily 4)  Bayer Aspirin 325 Mg Tabs (Aspirin) .... Once Daily 5)  Symbicort 80-4.5 Mcg/act Aero (Budesonide-Formoterol Fumarate) .Marland Kitchen.. 1-2 Puffs Every 12 Hrs ; Gargle & Spit After Use  Allergies (verified): No Known Drug Allergies  Physical Exam  General:  well-nourished,in no acute distress; alert,appropriate and cooperative throughout examination Ears:  External ear exam shows no significant lesions or deformities.  Otoscopic examination reveals clear canals, tympanic membranes are intact bilaterally without bulging, retraction, inflammation or discharge. Hearing  aids  bilaterally.TMs dull Nose:  External nasal examination shows no deformity or inflammation. Nasal  mucosa are pink and moist without lesions or exudates. Mouth:  Oral mucosa and oropharynx without lesions or exudates.  Teeth in good repair. Very hoarse Lungs:  Normal respiratory effort, chest expands symmetrically. Lungs are essentially  clear to auscultation,  minimal  crackles  w/o  wheezes. Heart:  Normal rate and regular rhythm. S1 and S2 normal without gallop, murmur, click, rub or other extra sounds. Cervical Nodes:  No lymphadenopathy noted Axillary Nodes:  No palpable lymphadenopathy   Impression & Recommendations:  Problem # 1:  BRONCHITIS-ACUTE (ICD-466.0)  The following medications were removed from the medication list:    Amoxicillin 500 Mg Caps (Amoxicillin) .Marland Kitchen... 1 three times a day    Ventolin Hfa 108 (90 Base) Mcg/act Aers (Albuterol sulfate) .Marland Kitchen... 1-2 puffs every 4 hrs as needed for cough or shortness of breath    Singulair 10 Mg Tabs (Montelukast sodium) .Marland Kitchen... 1 once daily until cogh gone His updated medication list for this problem includes:    Symbicort 80-4.5 Mcg/act Aero (Budesonide-formoterol fumarate) .Marland Kitchen... 1-2 puffs every 12 hrs ; gargle & spit after use    Amoxicillin-pot Clavulanate 875-125 Mg Tabs (Amoxicillin-pot clavulanate) .Marland Kitchen... 1 every 12 hrs with a meal  Problem # 2:  SINUSITIS- ACUTE-NOS (ICD-461.9)  The following medications were removed from the medication list:    Amoxicillin 500 Mg Caps (Amoxicillin) .Marland Kitchen... 1 three times a day His updated medication list for this problem includes:    Amoxicillin-pot Clavulanate 875-125 Mg Tabs (Amoxicillin-pot clavulanate) .Marland KitchenMarland KitchenMarland KitchenMarland Kitchen  1 every 12 hrs with a meal  Complete Medication List: 1)  Prevacid 30 Mg Cpdr (Lansoprazole) .... Take one capsule daily as needed 2)  Tylenol  .... Take by mouth as needed 3)  Zyrtec Allergy 10 Mg Tabs (Cetirizine hcl) .Marland Kitchen.. 1 by mouth once daily 4)  Bayer Aspirin 325 Mg Tabs (Aspirin) .... Once daily 5)  Symbicort 80-4.5 Mcg/act Aero (Budesonide-formoterol fumarate) .Marland Kitchen.. 1-2 puffs  every 12 hrs ; gargle & spit after use 6)  Amoxicillin-pot Clavulanate 875-125 Mg Tabs (Amoxicillin-pot clavulanate) .Marland Kitchen.. 1 every 12 hrs with a meal  Patient Instructions: 1)  Restart Symbicort two times a day until well. 2)  Drink as much NON dairy  fluid as you can tolerate for the next few days. Prescriptions: AMOXICILLIN-POT CLAVULANATE 875-125 MG TABS (AMOXICILLIN-POT CLAVULANATE) 1 every 12 hrs WITH a meal  #20 x 0   Entered and Authorized by:   Marga Melnick MD   Signed by:   Marga Melnick MD on 03/21/2010   Method used:   Faxed to ...       [facility]* (retail)       [address] [place], [place]  [address]       Ph: [phone]       Fax: [phone]   RxID:   [id number]    Orders Added: 1)  Est. Patient Level III [[address]]

## 2010-05-31 ENCOUNTER — Encounter: Payer: Self-pay | Admitting: Internal Medicine

## 2010-05-31 ENCOUNTER — Ambulatory Visit (INDEPENDENT_AMBULATORY_CARE_PROVIDER_SITE_OTHER): Payer: 59 | Admitting: Internal Medicine

## 2010-05-31 DIAGNOSIS — J209 Acute bronchitis, unspecified: Secondary | ICD-10-CM

## 2010-05-31 DIAGNOSIS — J45909 Unspecified asthma, uncomplicated: Secondary | ICD-10-CM

## 2010-05-31 DIAGNOSIS — J328 Other chronic sinusitis: Secondary | ICD-10-CM

## 2010-06-05 NOTE — Assessment & Plan Note (Signed)
Summary: Sinuses   Vital Signs:  Patient profile:   65 year old male Weight:      274.8 pounds BMI:     34.47 Temp:     97.8 degrees F oral Pulse rate:   76 / minute Resp:     14 per minute BP sitting:   128 / 80  (left arm) Cuff size:   large  Vitals Entered By: Shonna Chock CMA (May 31, 2010 8:18 AM) CC: Sinuses, URI symptoms, COPD follow-up   Primary Care Provider:  Alwyn Ren  CC:  Sinuses, URI symptoms, and COPD follow-up.  History of Present Illness:    His symptoms never resolved after  generic Augmentin X 10 days; he now  reports purulent yellow  nasal discharge and productive cough with brown sputum  , but denies sore throat and earache.  Associated symptoms include dyspnea.  The patient denies fever and wheezing.  The patient also reports frontal  headache & bilateral facial pain.  The patient denies the following risk factors for Strep sinusitis: tooth pain and tender adenopathy.  He  reports heat intolerance, but denies chest tightness and nocturnal awakening.  Medication use includes quick relief med very  rarely  ("I don't want to become dependent")  and controller med intermittently, Symbicort < 2 X /week.He is on MTX 2 days /week.  CBC was checked 02/21 & was "OK" as per Dr Karlyn Agee.  Current Medications (verified): 1)  Prevacid 30 Mg  Cpdr (Lansoprazole) .... Take One Capsule Daily As Needed 2)  Zyrtec Allergy 10 Mg Tabs (Cetirizine Hcl) .Marland Kitchen.. 1 By Mouth Once Daily 3)  Symbicort 80-4.5 Mcg/act Aero (Budesonide-Formoterol Fumarate) .Marland Kitchen.. 1-2 Puffs Every 12 Hrs ; Gargle & Spit After Use  Allergies (verified): No Known Drug Allergies  Physical Exam  General:  well-nourished,in no acute distress; alert,appropriate and cooperative throughout examination Ears:  External ear exam shows no significant lesions or deformities.  Otoscopic examination reveals clear canals, tympanic membranes are intact bilaterally without bulging, retraction, inflammation or discharge.TMs  dull . Hearing  aids  bilaterally. Nose:  External nasal examination shows no deformity or inflammation. Nasal mucosa are pink and moist without lesions or exudates. Hyponasal speech. Mouth:  Oral mucosa and oropharynx without lesions or exudates.  Teeth in good repair. Hoarse Lungs:  Normal respiratory effort, chest expands symmetrically. Lungs : mild  rhonchi on L, no crackles or wheezes. Heart:  Normal rate and regular rhythm. S1 and S2 normal without gallop, murmur, click, rub . S4 with slurring Cervical Nodes:  No lymphadenopathy noted Axillary Nodes:  No palpable lymphadenopathy Psych:  memory intact for recent and remote, normally interactive, and good eye contact.     Impression & Recommendations:  Problem # 1:  ACUTE BRONCHITIS (ICD-466.0)  RAD coponent The following medications were removed from the medication list:    Amoxicillin-pot Clavulanate 875-125 Mg Tabs (Amoxicillin-pot clavulanate) .Marland Kitchen... 1 every 12 hrs with a meal His updated medication list for this problem includes:    Avelox 400 Mg Tabs (Moxifloxacin hcl) .Marland Kitchen... 1 once daily    Ventolin Hfa 108 (90 Base) Mcg/act Aers (Albuterol sulfate) .Marland Kitchen... 1 -2 puffs every 4 hrs as needed  Orders: T-2 View CXR (71020TC)  Problem # 2:  OTHER CHRONIC SINUSITIS (ICD-473.8)  The following medications were removed from the medication list:    Amoxicillin-pot Clavulanate 875-125 Mg Tabs (Amoxicillin-pot clavulanate) .Marland Kitchen... 1 every 12 hrs with a meal His updated medication list for this problem includes:    Avelox  400 Mg Tabs (Moxifloxacin hcl) .Marland Kitchen... 1 once daily  Orders: T-Sinuses Complete (70220TC)  Problem # 3:  ASTHMA (ICD-493.90) Pathophysiology of asthma; controller med concept discussed His updated medication list for this problem includes:    Ventolin Hfa 108 (90 Base) Mcg/act Aers (Albuterol sulfate) .Marland Kitchen... 1 -2 puffs every 4 hrs as needed  Complete Medication List: 1)  Prevacid 30 Mg Cpdr (Lansoprazole) .... Take one  capsule daily as needed 2)  Zyrtec Allergy 10 Mg Tabs (Cetirizine hcl) .Marland Kitchen.. 1 by mouth once daily 3)  Symbicort 160 -4.5 Mcg/act Aero  .Marland Kitchen.. 1-2 puffs every 12 hrs ; gargle & spit after use 4)  Avelox 400 Mg Tabs (Moxifloxacin hcl) .Marland Kitchen.. 1 once daily 5)  Ventolin Hfa 108 (90 Base) Mcg/act Aers (Albuterol sulfate) .Marland Kitchen.. 1 -2 puffs every 4 hrs as needed  Patient Instructions: 1)  Neti pot once daily two times a day as needed for head congestion. 2)  Drink as much NON dairy  fluid as you can tolerate for the next few days. Prescriptions: SYMBICORT 160 -4.5 MCG/ACT AERO 1-2 puffs every 12 hrs ; gargle & spit after use  #1 x 11   Entered and Authorized by:   Marga Melnick MD   Signed by:   Marga Melnick MD on 05/31/2010   Method used:   Print then Give to Patient   RxID:   [id number] VENTOLIN HFA 108 (90 BASE) MCG/ACT AERS (ALBUTEROL SULFATE) 1 -2 puffs every 4 hrs as needed  #1 x 2   Entered and Authorized by:   Marga Melnick MD   Signed by:   Marga Melnick MD on 05/31/2010   Method used:   Print then Give to Patient   RxID:   [id number] AVELOX 400 MG TABS (MOXIFLOXACIN HCL) 1 once daily  #7 x 0   Entered and Authorized by:   Marga Melnick MD   Signed by:   Marga Melnick MD on 05/31/2010   Method used:   Print then Give to Patient   RxID:   [id number]    Orders Added: 1)  Est. Patient Level III [53664] 2)  T-2 View CXR [71020TC] 3)  T-Sinuses Complete [70220TC]

## 2010-06-26 LAB — CBC
HCT: 42.1 % (ref 39.0–52.0)
MCHC: 35.1 g/dL (ref 30.0–36.0)
MCV: 83.7 fL (ref 78.0–100.0)
RBC: 5.03 MIL/uL (ref 4.22–5.81)
WBC: 7.5 10*3/uL (ref 4.0–10.5)

## 2010-06-26 LAB — POCT I-STAT, CHEM 8
Calcium, Ion: 1.11 mmol/L — ABNORMAL LOW (ref 1.12–1.32)
Creatinine, Ser: 0.9 mg/dL (ref 0.4–1.5)
Glucose, Bld: 112 mg/dL — ABNORMAL HIGH (ref 70–99)
Hemoglobin: 15 g/dL (ref 13.0–17.0)
TCO2: 27 mmol/L (ref 0–100)

## 2010-06-26 LAB — LIPID PANEL
Cholesterol: 246 mg/dL — ABNORMAL HIGH (ref 0–200)
LDL Cholesterol: UNDETERMINED mg/dL (ref 0–99)
Triglycerides: 866 mg/dL — ABNORMAL HIGH (ref <150)

## 2010-06-26 LAB — DIFFERENTIAL
Eosinophils Absolute: 0.4 10*3/uL (ref 0.0–0.7)
Eosinophils Relative: 5 % (ref 0–5)
Lymphs Abs: 2 10*3/uL (ref 0.7–4.0)
Monocytes Relative: 8 % (ref 3–12)

## 2010-06-26 LAB — TSH: TSH: 3.423 u[IU]/mL (ref 0.350–4.500)

## 2010-06-26 LAB — PROTIME-INR: INR: 0.94 (ref 0.00–1.49)

## 2010-06-26 LAB — HEPARIN LEVEL (UNFRACTIONATED): Heparin Unfractionated: 0.5 IU/mL (ref 0.30–0.70)

## 2010-08-21 NOTE — Op Note (Signed)
NAME:  Kenneth [name], Kenneth [name]               ACCOUNT NO.:  [id number]   MEDICAL RECORD NO.:  [id number]          PATIENT TYPE:  AMB   LOCATION:  DAY                          FACILITY:  [facility]   PHYSICIAN:  Excell Seltzer. Annabell Howells, M.D.    DATE OF BIRTH:  May 05, 1945   DATE OF PROCEDURE:  09/10/2007  DATE OF DISCHARGE:                               OPERATIVE REPORT   PROCEDURE:  1. Cystoscopy, bilateral retrograde pyelograms with interpretation.  2. Right ureteroscopic stone extraction.  3. Left ureteroscopic stone extraction with holmium laser tripsy.  4. Insertion of bilateral double-J stents.   PREOPERATIVE DIAGNOSIS:  Right distal and left midureteral stones.   POSTOPERATIVE DIAGNOSIS:  Right distal and left midureteral stones.   SURGEON:  Excell Seltzer. Annabell Howells, M.D.   ANESTHESIA:  General.   SPECIMEN:  Stone fragments.   DRAINS:  Bilateral 6-French x 26-cm double-J stents.   COMPLICATIONS:  None.   INDICATIONS:  Kenneth [name] is a 65 year old white male with bilateral ureteral  stents who has had progressive symptoms.  He is to undergo ureteroscopy  today.   FINDINGS AND PROCEDURE:  The patient was given Cipro.  He was taken to  the operating room where general anesthetic was induced.  He was placed  in lithotomy position.  His perineum and genitalia were prepped with  Betadine solution.  He was draped in the usual sterile fashion.   Cystoscopy was performed using a 22-French scope and 12-degree lens.  Examination revealed a normal urethra.  The external sphincter was  intact.  The prostatic urethra had mild obstruction with trilobar  hyperplasia with a small middle lobe.  Examination of the bladder  revealed a mild trabeculation.  No tumors or stones were noted; however,  the right ureteral orifice had a fair amount of bullous edema.  The left  ureteral orifice was unremarkable.   A 5-French open-end catheter was placed through the right ureteral  orifice, and contrast was instilled in retrograde  fashion.  There was a  filling defect in the distal ureter consistent with a stone with mild  hydronephrosis proximal.   A guidewire was then passed through the open-end catheter to the kidney.  The cystoscope and open-end catheter were removed, leaving the wire in  place.  A 12-French dilator was passed without difficulty by the stone.   The 6-French short ureteroscope was then passed alongside the wire.  The  stone was visualized and was felt to be small enough to retrieve with a  basket.  A nitinol basket was then used to remove the stone without  difficulty.  At this point, the ureteroscope and wire were removed.   The left ureteral orifice was then cannulated with 5-French open-end  catheter.  Contrast was instilled in retrograde fashion.  This revealed  an impacted stone at the mid- ureter at approximately the level of the  iliac vessels, consistent with the finding on CT that was done  preoperatively.   After completion of retrograde pyelogram, a Sensor guidewire was passed  by the stone.  It was quite tight.  I then passed a short 6  ureteroscope  alongside the wire but was unable to negotiate it to the level of the  stone because of angulation at the iliac vessels.  I did pass a standard  Teflon-coated guidewire through the ureteroscope by the stone.  The  ureteroscope was then removed, and a 6-French flexible ureteroscope was  then placed over the Teflon-coated guidewire to the level of the stone.  The stone was tightly impacted at the level of the iliac vessels, but I  was able to engage it with the 260 micron fiber at 0.5 joules and 5 Hz.  The stone fragmented.  I did increase power to 0.8 watts and was able to  more finely pulverize the stone.  Eventually, I was able to advance the  scope by the stone, and only small residual fragments remained. An  attempt was made to basket these fragments with the nitinol basket, but  they were too small to easily retrieve.  The  ureteroscope was then  removed.   The cystoscope was then reinserted over the Sensor guidewire, and a 6-  [place] 26-cm double-J stent with string was inserted to the left kidney  without difficulty under fluoroscopic guidance.  The wire was removed,  leaving good coil in the kidney, a good coil in the bladder.  A  guidewire was then passed back up to the right kidney, and a 6-French 26-  cm stent was placed without difficulty under fluoroscopic guidance.  The  wire was removed, leaving good coil in the kidney, a good coil in the  bladder.  The bladder was drained.  The cystoscope was removed, leaving  both stent strings exiting the urethra.  The position of both stents was  confirmed after removal of the cystoscope, and the strings were secured  to the patient's penis.  The stone fragments from the right stone were  placed in a specimen container and given to the patient's family.   The patient was taken down from lithotomy position.  His anesthetic was  reversed.  He was moved to the recovery room in stable condition.  There  were no complications.      Excell Seltzer. Annabell Howells, M.D.  Electronically Signed     JJW/MEDQ  D:  09/10/2007  T:  09/10/2007  Job:  [id number]

## 2010-08-21 NOTE — Consult Note (Signed)
NAME:  Kenneth [name], Kenneth [name] NO.:  [id number]   MEDICAL RECORD NO.:  [id number]          PATIENT TYPE:  OIB   LOCATION:  0098                         FACILITY:  [facility]   PHYSICIAN:  Hettie Holstein, D.O.    DATE OF BIRTH:  1946-02-24   DATE OF CONSULTATION:  09/10/2007  DATE OF DISCHARGE:                                 CONSULTATION   PRIMARY CARE PHYSICIAN:  Dr. Alwyn Ren.   PRIMARY PULMONOLOGIST:  Barbaraann Share, MD, FCCP.   REQUESTING PHYSICIAN:  Excell Seltzer. Annabell Howells, M.D.   REASON FOR CONSULTATION:  Pneumonia, question aspiration.   HISTORY OF PRESENT ILLNESS:  Kenneth [name] is a very pleasant 65 year old  male who had been diagnosed with bilateral ureteral stones, was  scheduled for elective cystoscopy and stone extraction for September 10, 2007;  however, his pain became intractable and he presented to the emergency  department, where he arrived this past evening.  In any event, he  remained in the emergency department until ureteroscopy could be  performed today.  We were called because he had some difficulties with  maintaining adequate saturations and increased sputum production in the  PACU.  In any event, his saturations were low, requiring placement of a  Venti mask that would slow response.  Upon further discussion with Mr.  [name], it was discerned that he also had a prior history of some  chronic lung issues that are non-oxygen-dependent.  Details are not  available at this time.  He has seen Dr. Marcelyn Bruins, where he has  undergone pulmonary function tests, which we will further request during  this hospitalization.  In any event, he underwent a postoperative chest  radiograph, which did reveal a left infrahilar opacity suspicious for  aspiration.  Upon further questioning, Kenneth [name] states that he has  been having productive sputum recently.  In any event, he is being  admitted for initiation of IV antibiotics and the continuation of his  medications as ordered by  Dr. Annabell Howells.   PAST MEDICAL HISTORY:  As noted above, history of atrial fibrillation.  He has had ablation and has seen Dr. Antoine Poche in the past as well.  He  is currently in sinus rhythm with a controlled rate.  History of  rheumatic heart disease as a child as well as prior history of  gastroesophageal reflux disease.  He denies prior history of abdominal  surgeries.  He retains his gallbladder, appendix.  He reports  colonoscopies that he states that have been normal in the past  additionally, and he has had cardiac catheterization with normal  coronaries and preserved LV function.   MEDICATIONS:  Dosages are not provided at this time.  He is on  oxycodone, Flomax, Tylenol, and just started on Pyridium postoperatively  200 mg p.o. t.i.d., Cipro 400 IV b.i.d., and Percocet 5/325 q.6h. p.r.n.   ALLERGIES:  He denies known drug allergies.   SOCIAL HISTORY:  Tobacco:  He did smoke 4 years ago but quit.  Drinks  only very seldom alcohol.  He states that he has been drinking recently  due to the  pain with reference to his painful renal stones.   PHYSICAL EXAMINATION:  VITAL SIGNS:  In the PACU, his vital signs were  stable and he was on a Venti mask, 98%.  Respirations were nonlabored.  He was afebrile.  HEENT:  Revealed his head to be normocephalic, atraumatic.  Extraocular  muscles were intact.  NECK:  Supple, nontender.  CARDIOVASCULAR:  Exam revealed normal S1 and S2.  LUNGS:  Clear.  His respirations were nonlabored.  There was no dullness  to percussion.  ABDOMEN:  Soft, nontender.  No rebound or guarding.  EXTREMITIES:  Lower extremities revealed no edema.  NEUROLOGIC:  Revealed him to be alert and oriented, in no acute  distress.   LABORATORY DATA:  Sodium 140, potassium 4, BUN 7, creatinine 1.0, and  glucose of 94.  Hemoglobin was 13.5.  Chest x-ray as described above.   ASSESSMENT:  1. Bilateral ureteral stones, status post bilateral extraction and      double J  stents.  2. Pneumonia, suspect community acquired.  3. Chronic lung disease, perhaps underlying OHS or OSA.  4. History of atrial fibrillation, status post ablation.  5. Obesity.  6. Deconditioned.  7. History of alcohol use.   PLAN:  We will recommend community-acquired coverage.  We will add  Rocephin.  He is already on ciprofloxacin.  We will obtain PFTs from Dr.  Shelle Iron and follow with you clinically.   Thank you for this consultation.      Hettie Holstein, D.O.  Electronically Signed     ESS/MEDQ  D:  09/10/2007  T:  09/10/2007  Job:  [id number]   cc:   Dr. Tanna Savoy, MD,FCCP  [address]  [place]  [place] [address]

## 2010-08-21 NOTE — Procedures (Signed)
NAME:  Kenneth [name], Kenneth [name] NO.:  [id number]   MEDICAL RECORD NO.:  [id number]          PATIENT TYPE:  OUT   LOCATION:  SLEEP CENTER                 FACILITY:  [facility]   PHYSICIAN:  Barbaraann Share, MD,FCCPDATE OF BIRTH:  1945-11-14   DATE OF STUDY:  11/24/2007                            NOCTURNAL POLYSOMNOGRAM   REFERRING PHYSICIAN:   LOCATION:  Sleep lab.   REFERRING PHYSICIAN:  Barbaraann Share, MD,FCCP.   INDICATION FOR STUDY:  Hypersomnia with sleep apnea.   EPWORTH SLEEPINESS SCORE:  1.   SLEEP ARCHITECTURE:  Patient had a total sleep time of 245 minutes with  no slow wave sleep and only small quantities of REM.  Sleep onset  latency was mildly prolonged at 40 minutes, and REM onset was prolonged  as well at 169 minutes.  Sleep efficiency was poor at 63%.   RESPIRATORY DATA:  The patient was found to have 17 obstructive  hypopneas given him and AHI of 4 events per hour.  He was also found to  have 16 respiratory-effort-related arousals, giving him an RDI of 8  events per hour.  The events were not positional, but they were  increased during REM.  There was mild snoring.   OXYGEN DATA:  Patient had O2 desaturation as low as 89% with the  patient's obstructive events.   CARDIAC DATA:  Occasional PACs but no clinically significant arrhythmias  noted.   MOVEMENT-PARASOMNIA:  The patient was found to have 28 leg jerks with no  arousals or awakenings.   IMPRESSIONS-RECOMMENDATIONS:  1. Mild obstructive sleep apnea/hypopnea syndrome with an AHI of 4      events per hour, and a Respiratory Disturbance Index of 8 events      per hour.  There was oxygen desaturation as low as 89%.  Treatment      for this degree of sleep apnea can include weight loss alone, if      applicable, upper airway surgery, oral appliance, and      also CPAP.  2. Occasional premature atrial contractions but no significant      arrhythmias were noted.      Barbaraann Share, MD,FCCP  Diplomate, American Board of Sleep  Medicine  Electronically Signed     KMC/MEDQ  D:  11/27/2007 18:35:43  T:  11/27/2007 20:55:48  Job:  [id number]

## 2010-08-21 NOTE — Consult Note (Signed)
Kenneth [name], Kenneth [name]               ACCOUNT NO.:  [id number]   MEDICAL RECORD NO.:  [id number]          PATIENT TYPE:   LOCATION:                                 FACILITY:   PHYSICIAN:  Kenneth [name]. Kenneth Sires, MD, FCCP   DATE OF BIRTH:   DATE OF CONSULTATION:  DATE OF DISCHARGE:                                 CONSULTATION   REASON FOR CONSULTATION:  Postop respiratory failure.   HISTORY:  This an exceptionally challenging 61-year white male, who  gives a variable history of dyspnea (it has either been going on 2 or 3  years, or 10-12 years depending on how the question is asked), who has a  history of rapid atrial fibrillation and previous respiratory distress  for this, but underwent ablation in 2005 and saw Dr. Shelle [name] with a 2-  year history of progressive dyspnea to the point where he was getting  short of breath just going from the car to the house with groceries.  Associated with minimal dry cough, but also this has been present for  years.  This had led to a cardiac evaluation by Dr. Jens [name], which  did not reveal a cardiac mechanism for dyspnea, with a BNP of only 15,  and a  negative Cardiolite performed August of 2008.  The patient had 1  episode where he worsened acutely, related to what he calls the flu,  treated as an outpatient, but says he returned to baseline prior to the  consult that was performed by Dr. Shelle [name] on April 17, which revealed  PFTs that showed borderline restrictive changes, but a disproportionate  reduction in expiratory reserve volume, typical of the effects of  obesity.  He did have somewhat of a sawtooth inspiratory pattern,  consistent with possible vocal cord dysfunction, but otherwise an  unremarkable workup.  He has not undergone any physiologic studies,  exercise studies to date.  The recommendation that he work on weight  loss and consider a high resolution CT scan of the chest to look for  occult interstitial lung disease.   He comes in now  acutely worse with abdominal and pelvic pain and was  felt to have bilateral ureteral stones and underwent stent placement.  This was uneventful, but in the PACU he desaturated, and chest x-ray  suggested possible pneumonia and we were asked to see him.   Note the patient denies any change in chronic dyspnea or cough, even  during the time he had abdominal pain and has not had symptoms to  suggest significant infection/sepsis perioperatively.   Specifically denies any pleuritic pain now, and has had no history of  any difficulty swallowing, although he is having slight difficulty with  upper airway irritation, and general anesthesia today.  He denies any  increasing cough, pleuritic or exertional chest pain, orthopnea, PND or  leg swelling.   PAST MEDICAL HISTORY:  1. Allergic rhinitis.  2. Hypertension.  3. Atrial fibrillation status post ablation therapy in 2005, per      patient.  4. Influenza with pneumonia, clinical diagnosis only.  5. Status post appendectomy.  6. Polypectomy.  7. Ablation therapy for atrial fib in May of 2006 in [place].   FAMILY HISTORY:  Negative for respiratory diseases or rheumatologic  disorders, asthma or atopy.   SOCIAL HISTORY:  He is a remote smoker.  He previously worked in [employer].  He quit smoking in 1999.  He has no asbestos exposure.   REVIEW OF SYSTEMS:  Taken in as much detail as possible postoperatively,  essentially negative except as outlined above.  He does have mild  psoriatic rash, but no flare-up of arthritis.   PHYSICAL EXAMINATION:  This is a somber, white male who initially  appears alert, but gives inconsistent history.  His wife says it is due  to his anesthesia.  (Note that the inconsistent history extends also to  Dr. Teddy [name] daytime evaluation, however).  He is afebrile, normal vital  signs.  HEENT:  Unremarkable.  Pharynx clear.  LUNG:  Fields reveal minimal crackles in the bases only.  He does not  cough on  inspiration.  HEART:  There is regular rhythm without murmur, gallop, or rub.  ABDOMEN:  Soft, obese, but benign.  EXTREMITIES:  Without calf tenderness, cyanosis, clubbing.   Hemoglobin saturation is adequate on 2 L nasal prongs.   Chest x-ray was portable, very poor inspiration with low lung volumes  and question of consolidation at the left base.   Most recent lab data in our records includes a CBC does not show  eosinophilia from July 23, 2005, and a normal TSH the same day.   IMPRESSION:  Acute hypoxemic respiratory failure postoperative, is  probably just the effects of anesthesia recumbency, the use of narcotics  for pain, superimposed on very mild interstitial lung disease with micro  and macro atelectasis.  To overcome this, it is going to be recommended  incentive spirometry, minimization of narcotics and ambulation in the  morning.  If he desaturates and/or has any acute changes on a PA and  lateral chest x-ray, I would consider a high resolution CT angiogram  with high resolution cuts as well, but would prefer to do this an  outpatient setting once the micro atelectasis-related surgery is totally  resolved and we can get an idea whether, in fact, he does have any  interstitial lung disease.   I agree with Dr. Shelle [name] that the majority of this problem is related to  obesity with deconditioning.  I wonder also about the component of upper  airways obstruction suggested by his incentive spirometer, as his  inspiratory flow  volume curve that suggests the possibility, which is typical of a  patient with sleep apnea, or mild vocal cord dysfunction, but this can  be worked up as an outpatient as long as he is back to baseline in the  morning.   I discussed this all with his wife in detail.  Also I discussed it with  Dr. Annabell [name].      Kenneth [name]. Kenneth Sires, MD, [facility]  Electronically Signed     MBW/MEDQ  D:  09/10/2007  T:  09/10/2007  Job:  [id number]   cc:   Kenneth [name]. Kenneth [name],  M.D.  Fax: [phone]   Kenneth Share, MD,FCCP  [address]  [place]  [place] [address]

## 2010-08-21 NOTE — Assessment & Plan Note (Signed)
HEALTHCARE                            CARDIOLOGY OFFICE NOTE   DAVYD, PODGORSKI                      MRN:          [id number]  DATE:11/25/2006                            DOB:          March 13, 1946    [name] is a pleasant 65 year old gentleman whom I have seen in the  past for atrial fibrillation.  He has had a previous atrial fibrillation  ablation in [place] by Dr. Ronn Melena.  Note, he has also had a  previous cardiac catheterization performed in March of 2005.  At that  time, he was found to have preserved LV function and no significant  coronary artery disease.  A previous echocardiogram in June of 2003  showed normal LV function.  He presents today as an add on.  He is  complaining of progressive dyspnea on exertion over the past 2 years.  This does not occur with routine activities around the house, but it  does with more moderate exertion.  There is no orthopnea, PND, pedal  edema, palpitations, pre-syncope, syncope, or exertional chest pain.  Note, he does travel a significant amount.  He is on no medications at  present.   PHYSICAL EXAM:  Blood pressure 154/86, pulse 85.  He weighs 255 pounds.  HEENT:  Normal.  NECK:  Supple with no bruits.  There is no jugular venous distension.  CHEST:  Clear to auscultation with normal expansion.  CARDIOVASCULAR:  Regular rate and rhythm with normal S1, S2.  No  murmurs, rubs, or gallops.  ABDOMEN:  Nontender, nondistended.  Positive bowel sounds.  No  hepatosplenomegaly.  No mass appreciated.  There is no abdominal bruit.  EXTREMITIES:  No edema and I can palpate no cords.   His electrocardiogram shows a sinus rhythm at a rate of 82.  There are  no significant ST changes.  I do have a chest x-ray from April 22, 2006 that showed a question of bronchitis but there was no pneumonia.   DIAGNOSES:  1. Dyspnea - the etiology of this is not clear to me.  We will plan to      proceed with  an adenosine Myoview both to quantify his left      ventricular function and also to exclude coronary artery disease.      I will also check a BNP as well as a D-dimer (the patient does have      a significant travel history, although I think pulmonary embolus is      less likely).  If these are unremarkable, then we may need to refer      him back to Dr. Alwyn Ren for further pulmonary evaluation, including      possible pulmonary function test.  2. History of atrial fibrillation - he is status post ablation and he      remains in sinus rhythm.  We do not need to pursue any other      therapy in this regard.  3. History of hypertension - his blood pressure is mildly elevated and      this will need to be  tracked.  We can add a medication in the      future if indicated.   We will Kenneth [name] him back in 6 weeks.     Madolyn Frieze Jens Som, MD, [facility]  Electronically Signed    BSC/MedQ  DD: 11/25/2006  DT: 11/25/2006  Job #: [id number]

## 2010-08-24 NOTE — Op Note (Signed)
[place]  Patient:    JAMAI, DOLCE Visit Number: [id number] MRN: [id number]          Service Type: DSU Location: [facility] Attending Physician:  Milly Jakob Proc. Date: 01/05/01 Admit Date:  01/05/2001                             Operative Report  NOTE:  He is a 65 year old gentleman for orthopedic surgery.  PREOPERATIVE DIAGNOSIS:  Nonhealing plantar wound with severe clawtoe dislocated metatarsophalangeal joint.  POSTOPERATIVE DIAGNOSIS:  Nonhealing plantar wound with severe clawtoe dislocated metatarsophalangeal joint.  OPERATIONS PERFORMED: 1. Partial exostectomy of the fifth metatarsal plantar surface. 2. Partial exostectomy of the proximal phalanx, plantar surface. 3. Proximal interphalangeal joint fusion of the toe with a pin technique. 4. Extensor tendon lengthening. 5. Revision of plantar wound with primary closure.  SURGEON:  Harvie Junior, M.D.  ASSISTANT:  Currie Paris. Thedore Mins.  ANESTHESIA:  General.  BRIEF HISTORY:  Mr. Sissel is a 65 year old gentleman with a three-year history of having some significant problems with his plantar warts and wound problems.  He ultimately has had multiple techniques including laser technique, freezing technique and other techniques ultimately that left him with a nonhealing plantar wound.  Because of continued problems with a nonhealing plantar wound the patient ultimately was evaluated and felt to have a significant clawtoe, which was pulling the fat pad distally exposing the prominent metatarsal head to an area of nonability to handle the stress, and this was what was causing him to drain.  We talked about treatment options.  We ultimately felt that he needed a PIP fusion and an extensor tendon lengthening to help get the fat pad back in place.  We simultaneously felt that excising some of the metatarsal head on the plantar surface and excising some of the proximal phalanx on the  plantar surface would certainly help this situation; and, he was brought to the operating room for these procedures.  We also had talked about the possibility that this was a process that was not completely understood and that the surgery could fail.  He was well aware of this prior to the surgery.  PROCEDURE:   Patient was brought to the operating room and after adequate anesthesia was obtained with general anesthetic the patient was place supine upon the table the left leg was then prepped and draped in the usual sterile fashion.  Following this an Esmarch exsanguination was started and a blood pressure tourniquet was inflated to 200 mmHg.  Following this a linear incision was made, curved initially over the metatarsophalangeal joint and then straight distally over the proximal interphalangeal joint.  The extensor brevis tendon was identified and divided over about a centimeter length.  The longus tendon was then identified and this was Z lengthened approximately 2 cm.  Attention was then turned to the phalanx where it was tried to be held in a reduced position; we could not at that point.  A dorsal capsulotomy was undertaken and the metatarsal head was exposed.  The significant undersurface prominence of the metatarsal head was resected at this time.  Attention was then turned distally where through the straight incision the proximal interphalangeal joint was identified and the saw was used to resect the cartilaginous portion of the proximal phalanx head and the base of the middle phalanx.  Once this was done a pin was advanced in a retrograde fashion out the  tip of the toe and then in a retrograde fashion back through the interphalangeal joint, and this was left in place.  Attention was then turned to the plantar wound.  The plantar wound was excised completely.  This was about a 2 cm width of wound centrally and then this would was copiously irrigated and then closed with  interrupted mattress sutures.  Palpating the wound prior to wound closure showed that there was still some prominence underneath the wound and this was mostly from a prominence of the proximal phalanx articular surface at the metatarsophalangeal joint.  This was then excised with a saw thereby removing the the prominence from the plantar surface.  Once this was undertaken the pin was advanced across the metatarsophalangeal joint to help hold this in place in a straight position and attention was then turned to the plantar wound, where it was closed under minimum tension.  Attention was then turned dorsally where irrigation was undertaken.  The Z lengthened tendon was repair end-to-end.  The skin was then closed with interrupted 4-0 nylon sutures.  A sterile and compressive dressing were applied and patient was taken to recovery room where she was noted to be in satisfactory condition.  Estimated blood loss for the procedure was nonsignificant. Attending Physician:  Milly Jakob DD:  01/05/01 TD:  01/05/01 Job: [id number]

## 2010-08-24 NOTE — Cardiovascular Report (Signed)
Kenneth [name], Kenneth [name] NO.:  [id number]   MEDICAL RECORD NO.:  [id number]                   PATIENT TYPE:  OIB   LOCATION:  2899                                 FACILITY:  [facility]   PHYSICIAN:  Arturo Morton. Riley Kill, M.D. [facility]         DATE OF BIRTH:  11-22-1945   DATE OF PROCEDURE:  06/30/2003  DATE OF DISCHARGE:  06/30/2003                              CARDIAC CATHETERIZATION   INDICATIONS:  [name] is a very pleasant gentleman who presents with  recurrent episodes of atrial fibrillation.  He has seen Dr. Graciela Husbands.  He has  had some atypical chest pain.  The current study was done to assess coronary  anatomy.  No right heart catheterization was ordered.   PROCEDURES:  1. Left heart catheterization.  2. Selective coronary arteriography.  3. Selective left ventriculography.   DESCRIPTION OF PROCEDURE:  The procedure was performed from the right  femoral artery using 6 French catheters.  He tolerated the procedure well  and there were no complications.  We were ultimately able to get a JL-4 into  the left coronary ostium and get better opacification of the left coronary  system.  The 4 French catheters were subsequently removed and hemostasis  achieved by direct manual compression.   HEMODYNAMIC DATA:  1. Central aorta:  119/69, mean 91.  2. Left ventricle:  115/10.  3. No gradient on pullback across the aortic valve.   ANGIOGRAPHIC DATA:  1. Ventriculography was done in the RAO projection.  There was a low     contrast injection.  Overall, systolic function appeared to be well     preserved.  We could not calculate an ejection fraction related to     contrast volume.  However, it did appear to be normal.  2. The right coronary artery is a fairly smooth appearing vessel with a very     large posterior descending branch that extends out towards the apex.     There is minor luminal irregularity in the mid PDA, but no high grade     areas of focal  stenosis.  3. The left main coronary artery on initial shots looked tapered, but with     full injection appeared to reflux nicely with full opacification. The     left main did not appear to have critical narrowing.  4. The left anterior descending artery coursed to the apex.  The LAD tapered     distally.  There was a major diagonal branch.  Other than minimal luminal     irregularity, no significant focal areas of stenosis were identified.  5. The circumflex was a fairly large vessel.  There was a small first     marginal branch, then a moderate second marginal branch.  These were free     of significant disease.  The AV circumflex and posterior lateral system     also appeared to be free of critical  disease.   CONCLUSION:  1. Well-preserved left ventricular function.  2. No critical coronary obstruction.  3. Hypercholesterolemia.  4. Recurrent atrial fibrillation.   DISPOSITION:  Followup with Dr. Sherryl Manges to decide on further  evaluation.  It should be noted that the echocardiogram suggested mild  pulmonary hypertension although the RV systolic pressure estimate was only  31.                                               Arturo Morton. Riley Kill, M.D. [facility]    TDS/MEDQ  D:  06/30/2003  T:  07/01/2003  Job:  [id number]

## 2010-08-24 NOTE — H&P (Signed)
NAMEGARDY, MONTANARI NO.:  [id number]   MEDICAL RECORD NO.:  [id number]                   PATIENT TYPE:  OIB   LOCATION:  2899                                 FACILITY:  [facility]   PHYSICIAN:  Rollene Rotunda, M.D.                DATE OF BIRTH:  May 08, 1945   DATE OF ADMISSION:  06/30/2003  DATE OF DISCHARGE:  06/30/2003                                HISTORY & PHYSICAL   PRIMARY CARE PHYSICIAN:  Angelena Sole, M.D.   CARDIOLOGIST:  Olga Millers, M.D.   REASON FOR ADMISSION:  Symptomatic atrial fibrillation.   HISTORY OF PRESENT ILLNESS:  The patient is a pleasant 65 year old gentleman  with a history of paroxysmal atrial fibrillation.  He has been quite  symptomatic in the past when he has gotten this.  He was on flecainide but  had breakthroughs on this.  He would often cardiovert himself by grabbing  hold of his electric fence.  However, the fence is not working.  He went  into this rhythm at 1 o'clock this morning.  He is quite short of breath  with it and feels fatigued.  He feels the palpitations.  He has not had any  presyncope or syncope.  He has not any chest pain.  He did see Dr. Graciela Husbands in  March.  He was taken off his flecainide at that point in time.  He was sent  for cardiac catheterization with the results described below.  Essentially  it demonstrated nonobstructive coronary disease.  The plan was to consider  Tikosyn treatment if he had recurrent symptoms.  He says he has been having  these infrequently, with the last episode happening a couple of days ago.   He has not been having any chest pressure, neck discomfort, arm discomfort,  activity-induced nausea or vomiting, or excessive diaphoresis.   PAST MEDICAL HISTORY:  1. Paroxysmal atrial fibrillation.  2. Nonobstructive coronary artery disease (LAD minimal luminal     irregularities, circumflex free of significant disease, right coronary     artery minor luminal irregularities).  3. Anxiety.  4. Psoriasis.  5. Recent steroid treatment for possible nasal polyps with drainage.  6. Questionable sleep apnea (historically has symptoms consistent with this     but has never had a study).  7. Hypertension.   ALLERGIES:  None.   MEDICATIONS:  1. Cardizem 180 mg daily.  2. Coumadin.  3. Cozaar 50 mg daily.   SOCIAL HISTORY:  The patient is married.  He has children and grandchildren.  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  He  does not smoke cigarettes but does smoke a cigar a day.  He rarely drinks  alcohol.   FAMILY HISTORY:  Noncontributory.   REVIEW OF SYSTEMS:  As stated in the HPI and otherwise negative for other  systems.   PHYSICAL EXAMINATION:  GENERAL:  The patient is in no distress.  VITAL SIGNS:  Blood pressure 132/82, heart rate 108 and irregular.  HEENT:  Eyelids unremarkable, pupils equal, round, and reactive to light.  Fundi within normal limits.  Oral mucosa unremarkable.  NECK:  No jugular venous distention.  Wave form within normal limits.  Carotid upstroke brisk and symmetric, no bruits, thyromegaly.  LYMPHATIC:  No cervical, axillary, or inguinal adenopathy.  CHEST:  Lungs clear to auscultation bilaterally.  Chest unremarkable.  BACK:  No costovertebral angle tenderness.  CARDIAC:  PMI not displaced or sustained.  S1 and S2 within normal limits.  No S3, S4, no murmurs.  ABDOMEN:  Mildly obese, positive bowel sounds, normal in frequency and  pitch.  No bruits, rebound, guarding, or midline pulsatile mass.  No mass,  hepatomegaly, splenomegaly.  SKIN:  Skin rash, __________.  EXTREMITIES:  2+ pulse  throughout, no edema, no cyanosis, no clubbing.  NEUROLOGIC:  Oriented to person, place, and time.  Cranial nerves II-XII  grossly intact.  Motor grossly intact.   EKG:  Atrial fibrillation, axis within normal limits, intervals within  normal limits, no acute ST-T wave changes.   ASSESSMENT AND PLAN:  1.  Atrial fibrillation.  The patient has paroxysmal atrial fibrillation that     is quite symptomatic.  He has failed one C treatment.  At this point the     plan is treatment with dofetilide.  He will be hospitalized for this.  We     will get the appropriate labs.  The risks and benefits of this approach     have been extensively described to the patient including proarrhythmia,     and he understands and agrees to proceed.  He has been adequately     anticoagulated but will have his INR checked again today.  2. Sleep apnea.  The patient will need a sleep study in the future.  3. Depression.  The patient was started on Celexa by Dr. Graciela Husbands but did not     get this filled.  He is now followed by Dr. Ruthine Dose for this.                                                Rollene Rotunda, M.D.    JH/MEDQ  D:  11/07/2003  T:  11/07/2003  Job:  [id number]   cc:   Angelena Sole, M.D. [facility]

## 2010-08-24 NOTE — Discharge Summary (Signed)
Kenneth [name], OGAN NO.:  [id number]   MEDICAL RECORD NO.:  [id number]                   PATIENT TYPE:  INP   LOCATION:  2003                                 FACILITY:  [facility]   PHYSICIAN:  Rollene Rotunda, M.D.                DATE OF BIRTH:  11/18/1945   DATE OF ADMISSION:  11/07/2003  DATE OF DISCHARGE:  11/10/2003                           DISCHARGE SUMMARY - REFERRING   PROCEDURE:  None.   REASON FOR ADMISSION:  Please refer to dictated admission note.   LABORATORY DATA:  INR 2.6 at discharge.  Potassium 4.6 at discharge.  Normal  CBC, renal function, and liver enzymes, TSH 1.6.   HOSPITAL COURSE:  The patient was admitted for management of symptomatic  atrial fibrillation with known history of paroxysmal atrial fibrillation on  chronic Coumadin, and followed by Dr. Sherryl Manges.  The therapeutic option  of treating with Tikosyn have been broached by Dr. Sherryl Manges earlier this  year and the patient now agreed to proceed with hospitalization and  treatment.  The patient was initially started on 500 mcg q.12h. which was  subsequently decreased to 250 q.12h the day prior to discharge.  Serial EKGs  showed stable QT interval with a discharge QTC of April 21.  The patient was  initially started on 500 mcg q.12h. but this was subsequently decreased to  250 q.12h. after review of QTC by Dr. Sherryl Manges revealing greater than  500 millisecond interval.  EKG on discharge revealed a final QTC level of  420 milliseconds.  The patient chemically cardioverted to normal sinus  rhythm on hospital day two where he remained at the time of discharge.  The  patient was discharged home in hemodynamically stable condition on hospital  day three, maintaining NSR with a discharge INR level of 2.6.   DISCHARGE MEDICATIONS:  Tikosyn 0.25 mg q.12h. (new), Cardizem 180 mg daily,  Cozaar 15 mg daily, Coumadin as directed.   DISCHARGE INSTRUCTIONS:  Follow up with Dr.  Sherryl Manges on September 1 at  2:45 p.m.  Follow up with Dr. Olga Millers as previously scheduled.   DISCHARGE DIAGNOSIS:  1. Symptomatic paroxysmal atrial fibrillation.     A. Admitted for Tikosyn load with subsequent conversion to normal sinus        rhythm.  2. Chronic Coumadin.  3. Nonobstructive coronary artery disease.      Gene Serpe, P.A. [facility]                      Rollene Rotunda, M.D.    GS/MEDQ  D:  11/10/2003  T:  11/10/2003  Job:  [id number]   cc:   Angelena Sole, M.D. [facility]

## 2011-01-03 LAB — BASIC METABOLIC PANEL
BUN: 7
Chloride: 106
GFR calc non Af Amer: 57 — ABNORMAL LOW
Glucose, Bld: 136 — ABNORMAL HIGH
Glucose, Bld: 94
Potassium: 3.8
Potassium: 4
Sodium: 139

## 2011-01-03 LAB — CBC
HCT: 35.8 — ABNORMAL LOW
Hemoglobin: 12.2 — ABNORMAL LOW
RDW: 13.8
WBC: 7.1

## 2011-01-03 LAB — HEMOGLOBIN AND HEMATOCRIT, BLOOD: Hemoglobin: 13.5

## 2011-02-05 ENCOUNTER — Telehealth: Payer: Self-pay | Admitting: *Deleted

## 2011-02-05 DIAGNOSIS — I4891 Unspecified atrial fibrillation: Secondary | ICD-10-CM

## 2011-02-05 MED ORDER — DILTIAZEM HCL ER COATED BEADS 180 MG PO CP24: 180.0000 mg | ORAL_CAPSULE | Freq: Every day | ORAL | Status: DC

## 2011-02-05 NOTE — Telephone Encounter (Signed)
Pt called stating that he felt as though he is back in At Fib from time to time.  He was told he could take Cardizem 180 mg as needed to control it but needs a new RX sent into [facility]  Rx sent.  Note sent to scheduler to schedule appt to f/u with Dr Jens Som.  Deliah Goody, RN aware as well.

## 2011-02-20 ENCOUNTER — Encounter: Payer: Self-pay | Admitting: Internal Medicine

## 2011-02-20 ENCOUNTER — Telehealth: Payer: Self-pay | Admitting: Internal Medicine

## 2011-02-20 NOTE — Telephone Encounter (Signed)
Co-signed, Jamesetta So came back and spoke with me and I informed her to offer patient to see another Dr. With openings this morning (after reviewing patient's need to be seen-states patient is sick), for Dr.Hopper was booked. Patient refused to see another doctor and stated he is ok with coming back at his scheduled time (8am tomorrow). Patient thought he was told his appointment was today.

## 2011-02-21 ENCOUNTER — Encounter: Payer: Self-pay | Admitting: Internal Medicine

## 2011-02-21 ENCOUNTER — Ambulatory Visit (HOSPITAL_BASED_OUTPATIENT_CLINIC_OR_DEPARTMENT_OTHER)
Admission: RE | Admit: 2011-02-21 | Discharge: 2011-02-21 | Disposition: A | Discharge status: Home / Self Care | Payer: Medicare Other | Source: Ambulatory Visit | Attending: Internal Medicine | Admitting: Internal Medicine

## 2011-02-21 ENCOUNTER — Ambulatory Visit (INDEPENDENT_AMBULATORY_CARE_PROVIDER_SITE_OTHER): Payer: Medicare Other | Admitting: Internal Medicine

## 2011-02-21 DIAGNOSIS — Z0000 Encounter for general adult medical examination without abnormal findings: Secondary | ICD-10-CM

## 2011-02-21 DIAGNOSIS — J40 Bronchitis, not specified as acute or chronic: Secondary | ICD-10-CM | POA: Insufficient documentation

## 2011-02-21 DIAGNOSIS — R059 Cough, unspecified: Secondary | ICD-10-CM | POA: Insufficient documentation

## 2011-02-21 DIAGNOSIS — R05 Cough: Secondary | ICD-10-CM | POA: Insufficient documentation

## 2011-02-21 DIAGNOSIS — J209 Acute bronchitis, unspecified: Secondary | ICD-10-CM

## 2011-02-21 DIAGNOSIS — I4891 Unspecified atrial fibrillation: Secondary | ICD-10-CM

## 2011-02-21 DIAGNOSIS — R079 Chest pain, unspecified: Secondary | ICD-10-CM

## 2011-02-21 DIAGNOSIS — R55 Syncope and collapse: Secondary | ICD-10-CM

## 2011-02-21 LAB — BASIC METABOLIC PANEL
BUN: 11 mg/dL (ref 6–23)
CO2: 27 mEq/L (ref 19–32)
Chloride: 104 mEq/L (ref 96–112)
Creatinine, Ser: 1 mg/dL (ref 0.4–1.5)
Glucose, Bld: 106 mg/dL — ABNORMAL HIGH (ref 70–99)

## 2011-02-21 LAB — CK TOTAL AND CKMB (NOT AT ARMC): Total CK: 244 U/L — ABNORMAL HIGH (ref 7–232)

## 2011-02-21 MED ORDER — OMEPRAZOLE 20 MG PO CPDR: 20.0000 mg | DELAYED_RELEASE_CAPSULE | Freq: Every day | ORAL | Status: DC

## 2011-02-21 MED ORDER — HYDROCODONE-HOMATROPINE 5-1.5 MG/5ML PO SYRP: 5.0000 mL | ORAL_SOLUTION | Freq: Four times a day (QID) | ORAL | Status: AC | PRN

## 2011-02-21 MED ORDER — CEFUROXIME AXETIL 500 MG PO TABS: 500.0000 mg | ORAL_TABLET | Freq: Two times a day (BID) | ORAL | Status: AC

## 2011-02-21 MED ORDER — BUDESONIDE-FORMOTEROL FUMARATE 160-4.5 MCG/ACT IN AERO: 2.0000 | INHALATION_SPRAY | Freq: Two times a day (BID) | RESPIRATORY_TRACT | Status: DC

## 2011-02-21 NOTE — Patient Instructions (Signed)
Plain Mucinex for thick secretions ;force NON dairy fluids. Use a Neti pot daily as needed for sinus congestion  

## 2011-02-21 NOTE — Progress Notes (Signed)
Subjective:    Patient ID: Kenneth [name], male    DOB: 08-13-1945, 65 y.o.   MRN: [id number]  HPI Respiratory tract infection Onset/symptoms:11/9 as dry cough Exposures (illness/environmental/extrinsic):no Progression of symptoms:head & chest congestion Treatments/response:Nyquil, Dayquil Present symptoms: Fever/chills/sweats:feels hot @ night Frontal headache:chronic frontal pressure Facial pain:no Nasal purulence:no Dental pain:no Lymphadenopathy:no Wheezing/shortness of breath:no excacerbation Cough/sputum/hemoptysis:purulence in am, yellow to green/ brown Pleuritic pain:no  CHEST PAIN: Location: epigastric  Quality: dull  Duration: "constant " for >  4 days  Onset : 1 week ago Radiation:intermittently to back  Better with: no treatment  Worse with: coughing Symptoms History of Trauma/lifting: no  Nausea/vomiting: only nausea Diaphoresis: no  Shortness of breath: no  Edema: no  PND: no  Dizziness: no  Palpitations: no  Syncope: yes, he experienced syncope after coughing in context  turning &  standing 11/13. He was checked by EMS did not go the hospital because of stable vital signs. He had been in a meeting but lost consciousness in the next room. Loss of consciousness was brief. He sustained sustained injury to the left eyebrow area with slight bleeding. He denies any seizure stigmata Indigestion: yes, on PPI with control   Red Flags Worse with exertion: no   The history is recorded as he presented. His major concern was a restaurant tract infection, not the chest pain or recent episode of syncope.             Review of Systems he has been taking methotrexate for psoriasis from his dermatologist. He is aware that this could compromise his immune status     Objective:   Physical Exam Gen.: Healthy and well-nourished in appearance. Alert, appropriate and cooperative throughout exam. Head: Normocephalic without obvious abnormalities  Eyes: No corneal or  conjunctival inflammation noted. Pupils equal round reactive to light and accommodation.  Extraocular motion intact.  Ears: External  ear exam reveals no significant lesions or deformities. Canals clear .TMs dull. Hearing aids bilaterally. Nose: External nasal exam reveals no deformity or inflammation. Nasal mucosa are dry. No lesions or exudates noted.  Mouth: Oral mucosa and oropharynx reveal no lesions or exudates. Teeth in good repair. Neck: No deformities, masses, or tenderness noted. Range of motion & . Thyroid normal. Lungs: Normal respiratory effort; chest expands symmetrically. Lungs are clear to auscultation without rales, wheezes, or increased work of breathing. Dry cough Heart: Normal rate and rhythm. Normal S1 and S2. No gallop, click, or rub. Grade 1 /6 systolic  murmur. Abdomen: Bowel sounds normal; abdomen soft and nontender. No masses, organomegaly or hernias noted.                                                                                 Musculoskeletal/extremities: No deformity or scoliosis noted of  the thoracic or lumbar spine. No clubbing, cyanosis, edema, or deformity noted. Tone & strength  normal.Joints normal. Nail health  good. Vascular: Carotid, radial artery, dorsalis pedis and  posterior tibial pulses are full and equal. No bruits present. Homan's negative Neurologic: Alert and oriented x3. Deep tendon reflexes symmetrical and normal. Gait, Romberg testing arenormal       Skin: Scattered psoriatic lesions particularly of  the thorax. Minor closed laceration left eyebrow area. No sign of active cellulitis Lymph: No cervical, axillary  lymphadenopathy present. Psych: Mood and affect are normal. Normally interactive                                                                                         Assessment & Plan:  #1 bronchitis with purulent secretions and subjective bronchospasm. No significant proximal spasm is present clinically at this time. No  definite rhinosinusitis.  #2 chest pain, epigastric, atypical. This has been present for several days. It may be related to the severe , paroxysmal  coughing.  #3 syncope most likely a vagal reaction with the standing and cough. He has a past history of atrophic fibrillation. EKG reveals normal sinus rhythm with occasional premature atrial contraction  #4 psoriasis with methotrexate therapy; this could impact his immune status  Plan: See orders and recommendations

## 2011-02-22 LAB — CBC WITH DIFFERENTIAL/PLATELET
Basophils Absolute: 0 10*3/uL (ref 0.0–0.1)
Basophils Relative: 0.4 % (ref 0.0–3.0)
Eosinophils Absolute: 0.2 10*3/uL (ref 0.0–0.7)
MCHC: 33.4 g/dL (ref 30.0–36.0)
MCV: 86.4 fl (ref 78.0–100.0)
Monocytes Absolute: 0.4 10*3/uL (ref 0.1–1.0)
Neutrophils Relative %: 71.8 % (ref 43.0–77.0)
Platelets: 184 10*3/uL (ref 150.0–400.0)
RDW: 17.8 % — ABNORMAL HIGH (ref 11.5–14.6)

## 2011-03-27 ENCOUNTER — Ambulatory Visit (INDEPENDENT_AMBULATORY_CARE_PROVIDER_SITE_OTHER): Payer: Medicare Other

## 2011-03-27 DIAGNOSIS — Z23 Encounter for immunization: Secondary | ICD-10-CM

## 2011-09-05 ENCOUNTER — Telehealth: Payer: Self-pay | Admitting: Cardiology

## 2011-09-05 NOTE — Telephone Encounter (Signed)
Left message for pt call back:  

## 2011-09-05 NOTE — Telephone Encounter (Signed)
Pt having BP low, dizzy, lightheadedness, pls call [phone]

## 2011-09-05 NOTE — Telephone Encounter (Signed)
Spoke with pt who reports yesterday he was lightheaded and dizzy off and on for most of the day.  Last evening when he got home he still felt poorly - took his HR and is was 160.  It was not irregular.  His BP was 116/56.  He took Cardizem which took several hours to work.  Today his HR is running about 88 bpm.  He denies dizziness, lightheadedness or irregular HB.  He states that he is just worn out.  He has had this to occur several times but randomly.  He would like to know how to proceed.  Will forward information to Dr Jens Som for review and orders.

## 2011-09-05 NOTE — Telephone Encounter (Signed)
Schedule fuov Woodson Macha  

## 2011-09-17 NOTE — Telephone Encounter (Signed)
Spoke with pt, follow up appt with dr Jens Som scheduled

## 2011-09-30 ENCOUNTER — Encounter: Payer: Self-pay | Admitting: Cardiology

## 2011-09-30 ENCOUNTER — Ambulatory Visit (INDEPENDENT_AMBULATORY_CARE_PROVIDER_SITE_OTHER): Payer: Medicare Other | Admitting: Cardiology

## 2011-09-30 VITALS — BP 120/80 | HR 72 | Ht 75.0 in | Wt 261.0 lb

## 2011-09-30 DIAGNOSIS — I4891 Unspecified atrial fibrillation: Secondary | ICD-10-CM

## 2011-09-30 DIAGNOSIS — R0602 Shortness of breath: Secondary | ICD-10-CM

## 2011-09-30 LAB — TSH: TSH: 1.74 u[IU]/mL (ref 0.35–5.50)

## 2011-09-30 MED ORDER — DILTIAZEM HCL 30 MG PO TABS: ORAL_TABLET | ORAL | Status: DC

## 2011-09-30 MED ORDER — ASPIRIN EC 81 MG PO TBEC: 81.0000 mg | DELAYED_RELEASE_TABLET | Freq: Every day | ORAL | Status: DC

## 2011-09-30 NOTE — Progress Notes (Signed)
   HPI: Pleasant male for fu of atrial fibrillation. He had a cardiac catheterization in March of 2005 that showed normal LV function and no significant coronary artery disease. Patient had atrial fibrillation ablation at [facility] 2006. Last Myoview in August of 2008 showed an ejection fraction of 58% and no ischemia. Last echocardiogram in may of 2011 showed normal LV function and trace aortic insufficiency. He has not been seen by cardiology since May of 2011. Over the past several months the patient has noted recurrent atrial fibrillation. He describes sudden onset of palpitations described as his heart racing. There is mild chest tightness but no dyspnea or presyncope. He typically goes to bed and when he awakes the atrial fibrillation is gone. He has mild dyspnea on exertion which is chronic. No orthopnea, PND, pedal edema or exertional chest pain.    Current Outpatient Prescriptions  Medication Sig Dispense Refill  . budesonide-formoterol (SYMBICORT) 160-4.5 MCG/ACT inhaler Inhale 2 puffs into the lungs 2 (two) times daily. Gargle & spit after use  1 Inhaler  11  . cetirizine (ZYRTEC) 10 MG tablet Take 10 mg by mouth daily.        . folic acid (FOLVITE) 400 MCG tablet Take 400 mcg by mouth daily.        . methotrexate (RHEUMATREX) 5 MG tablet Take 5 mg by mouth. 6 tab on Tuesday, 2 tab on Wed       . omeprazole (PRILOSEC) 20 MG capsule Take 40 mg by mouth daily.      Marland Kitchen DISCONTD: omeprazole (PRILOSEC) 20 MG capsule Take 1 capsule (20 mg total) by mouth daily.  90 capsule  1     Past Medical History  Diagnosis Date  . Atrial fib/flutter, transient   . HBP (high blood pressure)   . Psoriasis   . Dyspepsia   . Deafness   . Kidney stones   . Hyperlipidemia   . Macular degeneration   . Pneumonia 2009    in context of Flu    Past Surgical History  Procedure Date  . Rotator cuff repair   . A fib ablation     History   Social History  . Marital Status: Married    Spouse Name: N/A   Number of Children: N/A  . Years of Education: N/A   Occupational History  . Not on file.   Social History Main Topics  . Smoking status: Former Smoker    Quit date: 09/29/2001  . Smokeless tobacco: Not on file  . Alcohol Use: Not on file  . Drug Use: Not on file  . Sexually Active: Not on file   Other Topics Concern  . Not on file   Social History Narrative  . No narrative on file    ROS: no fevers or chills, productive cough, hemoptysis, dysphasia, odynophagia, melena, hematochezia, dysuria, hematuria, rash, seizure activity, orthopnea, PND, pedal edema, claudication. Remaining systems are negative.  Physical Exam: Well-developed well-nourished in no acute distress.  Skin is warm and dry.  HEENT is normal.  Neck is supple.  Chest is clear to auscultation with normal expansion.  Cardiovascular exam is regular rate and rhythm.  Abdominal exam nontender or distended. No masses palpated. Extremities show no edema. neuro grossly intact  ECG sinus rhythm at a rate of 72. Occasional PACs and sinus arrhythmia. No ST changes.

## 2011-09-30 NOTE — Patient Instructions (Addendum)
Your physician wants you to follow-up in: 6 MONTHS WITH DR Jens Som You will receive a reminder letter in the mail two months in advance. If you don't receive a letter, please call our office to schedule the follow-up appointment.   START ASPIRIN 81 MG ONCE DAILY WITH FOOD  TAKE CARDIZEM 30 MG AS NEEDED FOR ELEVATED HEART RATE, MAY REPEAT IN TWO HOURS IF HEART RATE STILL ELEVATED  Your physician has requested that you have an echocardiogram. Echocardiography is a painless test that uses sound waves to create images of your heart. It provides your doctor with information about the size and shape of your heart and how well your heart's chambers and valves are working. This procedure takes approximately one hour. There are no restrictions for this procedure.   Your physician recommends that you HAVE LAB WORK TODAY

## 2011-09-30 NOTE — Assessment & Plan Note (Signed)
Chronic and unchanged. No further evaluation.

## 2011-09-30 NOTE — Assessment & Plan Note (Addendum)
Patient is having recurrent atrial fibrillation. Long discussion today concerning anti-coagulation and therapy. His only embolic risk factor is age greater than 66. He denies history of diabetes, hypertension, prior CVA. No history of CHF. I recommended aspirin 81 mg daily. He reluctantly agreed. We will add Cardizem 30 mg by mouth when necessary bouts of atrial fibrillation. He may repeat this 2 hours later if his symptoms have not resolved. If they become more frequent then we may consider an antiarrhythmic versus ablation. At present he does not want to pursue these as his symptoms are relatively infrequent. Plan repeat echocardiogram and check TSH.

## 2011-10-02 ENCOUNTER — Ambulatory Visit (HOSPITAL_COMMUNITY): Payer: Medicare Other | Attending: Internal Medicine | Admitting: Radiology

## 2011-10-02 DIAGNOSIS — I517 Cardiomegaly: Secondary | ICD-10-CM | POA: Insufficient documentation

## 2011-10-02 DIAGNOSIS — R0609 Other forms of dyspnea: Secondary | ICD-10-CM | POA: Insufficient documentation

## 2011-10-02 DIAGNOSIS — I4891 Unspecified atrial fibrillation: Secondary | ICD-10-CM | POA: Insufficient documentation

## 2011-10-02 DIAGNOSIS — E669 Obesity, unspecified: Secondary | ICD-10-CM | POA: Insufficient documentation

## 2011-10-02 DIAGNOSIS — R0989 Other specified symptoms and signs involving the circulatory and respiratory systems: Secondary | ICD-10-CM | POA: Insufficient documentation

## 2011-10-02 NOTE — Progress Notes (Signed)
Echocardiogram performed.  

## 2011-10-22 ENCOUNTER — Inpatient Hospital Stay (HOSPITAL_COMMUNITY)
Admission: EM | Admit: 2011-10-22 | Discharge: 2011-10-23 | DRG: 310 | Disposition: A | Discharge status: Home / Self Care | Payer: Medicare Other | Attending: Cardiovascular Disease | Admitting: Cardiovascular Disease

## 2011-10-22 ENCOUNTER — Emergency Department (HOSPITAL_COMMUNITY): Payer: Medicare Other

## 2011-10-22 ENCOUNTER — Telehealth: Payer: Self-pay | Admitting: Cardiology

## 2011-10-22 ENCOUNTER — Encounter (HOSPITAL_COMMUNITY): Payer: Self-pay | Admitting: *Deleted

## 2011-10-22 DIAGNOSIS — Z7982 Long term (current) use of aspirin: Secondary | ICD-10-CM

## 2011-10-22 DIAGNOSIS — I4891 Unspecified atrial fibrillation: Secondary | ICD-10-CM

## 2011-10-22 DIAGNOSIS — I4892 Unspecified atrial flutter: Secondary | ICD-10-CM | POA: Diagnosis present

## 2011-10-22 DIAGNOSIS — I10 Essential (primary) hypertension: Secondary | ICD-10-CM | POA: Diagnosis present

## 2011-10-22 DIAGNOSIS — I480 Paroxysmal atrial fibrillation: Secondary | ICD-10-CM | POA: Diagnosis present

## 2011-10-22 DIAGNOSIS — Z8249 Family history of ischemic heart disease and other diseases of the circulatory system: Secondary | ICD-10-CM

## 2011-10-22 DIAGNOSIS — Z87891 Personal history of nicotine dependence: Secondary | ICD-10-CM

## 2011-10-22 LAB — CBC WITH DIFFERENTIAL/PLATELET
Basophils Absolute: 0.1 10*3/uL (ref 0.0–0.1)
Eosinophils Absolute: 0.2 10*3/uL (ref 0.0–0.7)
Eosinophils Relative: 3 % (ref 0–5)
MCH: 28 pg (ref 26.0–34.0)
MCHC: 33.7 g/dL (ref 30.0–36.0)
MCV: 83.1 fL (ref 78.0–100.0)
Platelets: 191 10*3/uL (ref 150–400)
RDW: 16 % — ABNORMAL HIGH (ref 11.5–15.5)
WBC: 5.5 10*3/uL (ref 4.0–10.5)

## 2011-10-22 LAB — URINALYSIS, ROUTINE W REFLEX MICROSCOPIC
Bilirubin Urine: NEGATIVE
Glucose, UA: NEGATIVE mg/dL
Ketones, ur: NEGATIVE mg/dL
Leukocytes, UA: NEGATIVE
Protein, ur: NEGATIVE mg/dL

## 2011-10-22 LAB — COMPREHENSIVE METABOLIC PANEL
ALT: 27 U/L (ref 0–53)
AST: 33 U/L (ref 0–37)
Calcium: 9.1 mg/dL (ref 8.4–10.5)
GFR calc Af Amer: >90 mL/min (ref >90)
Sodium: 140 mEq/L (ref 135–145)
Total Protein: 7.3 g/dL (ref 6.0–8.3)

## 2011-10-22 LAB — CARDIAC PANEL(CRET KIN+CKTOT+MB+TROPI)
CK, MB: 3.7 ng/mL (ref 0.3–4.0)
Relative Index: 2.3 (ref 0.0–2.5)
Troponin I: <0.3 ng/mL (ref <0.30)
Troponin I: <0.3 ng/mL (ref <0.30)

## 2011-10-22 LAB — PROTIME-INR: INR: 0.98 (ref 0.00–1.49)

## 2011-10-22 LAB — MRSA PCR SCREENING: MRSA by PCR: NEGATIVE

## 2011-10-22 MED ORDER — SODIUM CHLORIDE 0.9 % IV SOLN
Freq: Once | INTRAVENOUS | Status: AC
  Administered 2011-10-22: 11:00:00 via INTRAVENOUS

## 2011-10-22 MED ORDER — ASPIRIN EC 81 MG PO TBEC
81.0000 mg | DELAYED_RELEASE_TABLET | Freq: Every day | ORAL | Status: DC
  Administered 2011-10-23: 81 mg via ORAL
  Filled 2011-10-22: qty 1

## 2011-10-22 MED ORDER — PANTOPRAZOLE SODIUM 40 MG PO TBEC
40.0000 mg | DELAYED_RELEASE_TABLET | Freq: Every day | ORAL | Status: DC
  Administered 2011-10-23: 40 mg via ORAL
  Filled 2011-10-22: qty 1

## 2011-10-22 MED ORDER — FLECAINIDE ACETATE 100 MG PO TABS
300.0000 mg | ORAL_TABLET | Freq: Once | ORAL | Status: AC
  Administered 2011-10-22: 300 mg via ORAL
  Filled 2011-10-22: qty 3

## 2011-10-22 MED ORDER — ASPIRIN EC 325 MG PO TBEC: 325.0000 mg | DELAYED_RELEASE_TABLET | Freq: Every day | ORAL | Status: DC

## 2011-10-22 MED ORDER — METHOTREXATE 2.5 MG PO TABS
7.5000 mg | ORAL_TABLET | ORAL | Status: DC
  Administered 2011-10-22: 7.5 mg via ORAL
  Filled 2011-10-22: qty 3

## 2011-10-22 MED ORDER — ASPIRIN EC 81 MG PO TBEC: 81.0000 mg | DELAYED_RELEASE_TABLET | Freq: Every day | ORAL | Status: DC

## 2011-10-22 MED ORDER — FOLIC ACID 1 MG PO TABS
1.0000 mg | ORAL_TABLET | Freq: Every day | ORAL | Status: DC
  Administered 2011-10-23: 1 mg via ORAL
  Filled 2011-10-22: qty 1

## 2011-10-22 MED ORDER — SODIUM CHLORIDE 0.9 % IV SOLN: 250.0000 mL | INTRAVENOUS | Status: DC | PRN

## 2011-10-22 MED ORDER — METHOTREXATE 2.5 MG PO TABS
7.5000 mg | ORAL_TABLET | ORAL | Status: DC
  Administered 2011-10-23: 7.5 mg via ORAL
  Filled 2011-10-22: qty 3

## 2011-10-22 MED ORDER — METHOTREXATE 2.5 MG PO TABS
7.5000 mg | ORAL_TABLET | Freq: Two times a day (BID) | ORAL | Status: DC
  Filled 2011-10-22 (×2): qty 3

## 2011-10-22 MED ORDER — LORATADINE 10 MG PO TABS
10.0000 mg | ORAL_TABLET | Freq: Every day | ORAL | Status: DC
  Administered 2011-10-23: 10 mg via ORAL
  Filled 2011-10-22: qty 1

## 2011-10-22 MED ORDER — ASPIRIN EC 81 MG PO TBEC
81.0000 mg | DELAYED_RELEASE_TABLET | ORAL | Status: DC
  Filled 2011-10-22: qty 1

## 2011-10-22 MED ORDER — DILTIAZEM HCL 25 MG/5ML IV SOLN
20.0000 mg | Freq: Once | INTRAVENOUS | Status: AC
  Administered 2011-10-22: 20 mg via INTRAVENOUS

## 2011-10-22 MED ORDER — ONDANSETRON HCL 4 MG/2ML IJ SOLN: 4.0000 mg | Freq: Four times a day (QID) | INTRAMUSCULAR | Status: DC | PRN

## 2011-10-22 MED ORDER — DILTIAZEM HCL 100 MG IV SOLR
5.0000 mg/h | INTRAVENOUS | Status: DC
  Administered 2011-10-22: 15 mg/h via INTRAVENOUS
  Administered 2011-10-22: 10 mg/h via INTRAVENOUS
  Administered 2011-10-22: 5 mg/h via INTRAVENOUS
  Administered 2011-10-23: 10 mg/h via INTRAVENOUS

## 2011-10-22 MED ORDER — HEPARIN (PORCINE) IN NACL 100-0.45 UNIT/ML-% IJ SOLN
1500.0000 [IU]/h | INTRAMUSCULAR | Status: DC
  Administered 2011-10-22 – 2011-10-23 (×2): 1500 [IU]/h via INTRAVENOUS
  Filled 2011-10-22 (×4): qty 250

## 2011-10-22 MED ORDER — HEPARIN BOLUS VIA INFUSION: 4000.0000 [IU] | Freq: Once | INTRAVENOUS | Status: DC

## 2011-10-22 MED ORDER — ACETAMINOPHEN 325 MG PO TABS: 650.0000 mg | ORAL_TABLET | ORAL | Status: DC | PRN

## 2011-10-22 MED ORDER — HEPARIN BOLUS VIA INFUSION
5000.0000 [IU] | Freq: Once | INTRAVENOUS | Status: AC
  Administered 2011-10-22: 5000 [IU] via INTRAVENOUS

## 2011-10-22 MED ORDER — ZOLPIDEM TARTRATE 5 MG PO TABS: 5.0000 mg | ORAL_TABLET | Freq: Every evening | ORAL | Status: DC | PRN

## 2011-10-22 MED ORDER — FLECAINIDE ACETATE 100 MG PO TABS: 100.0000 mg | ORAL_TABLET | Freq: Two times a day (BID) | ORAL | Status: DC

## 2011-10-22 MED ORDER — ALPRAZOLAM 0.25 MG PO TABS: 0.2500 mg | ORAL_TABLET | Freq: Two times a day (BID) | ORAL | Status: DC | PRN

## 2011-10-22 MED ORDER — SODIUM CHLORIDE 0.9 % IJ SOLN: 3.0000 mL | Freq: Two times a day (BID) | INTRAMUSCULAR | Status: DC

## 2011-10-22 MED ORDER — FLUTICASONE PROPIONATE 50 MCG/ACT NA SUSP: 1.0000 | Freq: Every day | NASAL | Status: DC | PRN

## 2011-10-22 MED ORDER — NITROGLYCERIN 0.4 MG SL SUBL: 0.4000 mg | SUBLINGUAL_TABLET | SUBLINGUAL | Status: DC | PRN

## 2011-10-22 MED ORDER — SODIUM CHLORIDE 0.9 % IJ SOLN: 3.0000 mL | INTRAMUSCULAR | Status: DC | PRN

## 2011-10-22 NOTE — ED Notes (Signed)
Pt states can feel fluttering in chest has hx of afib and usually goes away this time  It has stayed and he has had some cp and some sob

## 2011-10-22 NOTE — Progress Notes (Signed)
ANTICOAGULATION CONSULT NOTE - Follow Up Consult  Pharmacy Consult for Heparin Indication: atrial fibrillation  No Known Allergies  Patient Measurements: Height: 6\' 3"  (190.5 cm) Weight: 250 lb (113.399 kg) IBW/kg (Calculated) : 84.5  Heparin Dosing Weight: 108 kg  Vital Signs: Temp: 97.4 F (36.3 C) (07/16 1807) Temp src: Oral (07/16 1807) BP: 119/76 mmHg (07/16 1807) Pulse Rate: 81  (07/16 1731)  Labs:  Basename 10/22/11 1702 10/22/11 1042 10/22/11 1041  HGB -- -- 13.4  HCT -- -- 39.8  PLT -- -- 191  APTT -- -- --  LABPROT -- -- 13.2  INR -- -- 0.98  HEPARINUNFRC 0.34 -- --  CREATININE -- -- 0.86  CKTOTAL -- 202 --  CKMB -- 4.7* --  TROPONINI -- <0.30 --    Estimated Creatinine Clearance: 114.8 ml/min (by C-G formula based on Cr of 0.86).  Assessment:   Heparin level is low therapeutic on 1500 units/hr, but may drop some by am, as bolus effect diminishes.  Goal of Therapy:  Heparin level 0.3-0.7 units/ml Monitor platelets by anticoagulation protocol: Yes   Plan:    Increase heparin to 1650 units/hr.   Next heparin level and CBC in am.  Dennie Fetters, [place] Pager: [phone] 10/22/2011,7:43 PM

## 2011-10-22 NOTE — ED Notes (Signed)
Dr cooper here to see pt states if 1 hour after flecanide and pt converts pt may go home

## 2011-10-22 NOTE — ED Notes (Signed)
Per dr Manus Gunning pt may take own metheltrexate, new IV started for Heparin drip left forearm pt aaox4 states feels well family at bedside

## 2011-10-22 NOTE — ED Notes (Signed)
Sitting up on stretcher with feet dangling;  ccm showing ST rate 128 without any ectopy; when pt raised feet back onto bed for an EKG to be done - pt then went back into Atrial Fib rate b/w 80-100

## 2011-10-22 NOTE — ED Notes (Signed)
Pt attempting at this time to provide an urine specimen; 2 sprite zeros and a cup of ice given to wife to drink

## 2011-10-22 NOTE — ED Notes (Signed)
Resting quietly on stretcher - no complaints at this time; ccm remains atrial fib rate 100 - 115; Cardizem drip increased to 10 mg/hr; discussed with EDP prior

## 2011-10-22 NOTE — ED Notes (Signed)
Attempted to call report - nurse unable to take at this time - will call back in 10 min

## 2011-10-22 NOTE — ED Notes (Signed)
Heart Healthy Diet ordered spoke to [facility]

## 2011-10-22 NOTE — H&P (Signed)
Patient ID:  Kenneth [name]  MRN: [id number]  DOB/AGE: 66-Jun-1947 66 y.o.  Admit date: 10/22/2011  Primary Physician Kenneth Melnick, MD  Primary Cardiologist [facility]  Reason for Consultation Palpitations  [name] is a 66 y.o. male with a history of atrial arrhythmias but no CAD. He has been having more frequent episodes of atrial fib lately. They happen during the day and can last several hours. He feels they will resolve if he goes to sleep. He feels a little tired and has some DOE but denies chest pain, SOB at rest, presyncope or syncope. He occasionally takes his PO cardizem but has not done that in over a month.  Last pm, he had sudden onset of tachypalpitations at about 10:30 pm. His heart rate was elevated but he does not know how much. He was a little dizzy with this and really felt his heart pounding. He developed some chest pain - soreness - after the palpitations had been going on a while. He took 30 mg cardizem at 11 PM and took another dose at approximately 2 AM. He did not sleep very much. This a.m. when his symptoms had not improved, he took his blood pressure and heart rate. His blood pressure was approximately 100 systolic and his heart rate was in the 150s. He contacted our office and came to the emergency room as instructed. In the emergency room, he received IV Cardizem with a bolus and drip. He also was started on IV heparin. His heart rate control has improved and his heart rate is generally in the 90s to 110s on Cardizem at 5 mg per hour. His chest pain has resolved.  Past Medical History   Diagnosis  Date   .  Atrial fib/flutter, transient    .  HBP (high blood pressure)    .  Psoriasis    .  Dyspepsia    .  Deafness    .  Kidney stones    .  Hyperlipidemia    .  Macular degeneration    .  Pneumonia  2009     in context of Flu    Past Surgical History   Procedure  Date   .  Rotator cuff repair    .  A fib ablation     No Known Allergies  I have reviewed the  patient's current medications   .  sodium chloride   Intravenous  Once   .  diltiazem  20 mg  Intravenous  Once   .  heparin  5,000 Units  Intravenous  Once   .  DISCONTD: heparin  4,000 Units  Intravenous  Once     .  diltiazem (CARDIZEM) infusion  5 mg/hr (10/22/11 1046)   .  heparin      Medication  Sig   aspirin EC 81 MG tablet  Take 1 tablet (81 mg total) by mouth daily.   cetirizine (ZYRTEC) 10 MG tablet  Take 10 mg by mouth daily.   diltiazem (CARDIZEM) 30 MG tablet  Take 30 mg by mouth daily as needed. Take for elevated heart rate may repeat in 2 hour if needed   fluticasone (FLONASE) 50 MCG/ACT nasal spray  Place 1 spray into the nose daily as needed. For congestion   folic acid (FOLVITE) 1 MG tablet  Take 1 mg by mouth daily.   methotrexate (RHEUMATREX) 2.5 MG tablet  Take 7.5 mg by mouth. Caution:Chemotherapy. Protect from light. Take 3 tablets twice daily on Tuesday, 3  tablets on Wed morning   omeprazole 40 MG capsule  Take 40 mg by mouth daily.    History    Social History   .  Marital Status:  Married     Spouse Name:  N/A     Number of Children:  N/A   .  Years of Education:  N/A    Occupational History   .  Not on file.    Social History Main Topics   .  Smoking status:  Former Smoker     Quit date:  09/29/2001   .  Smokeless tobacco:  Not on file   .  Alcohol Use:  No   .  Drug Use:  No   .  Sexually Active:  Not on file    Family History   Problem  Relation  Age of Onset   .  Coronary artery disease  Mother    .  Cancer  Mother       BREAST,COLON    .  Heart disease  Maternal Grandmother       MI    .  Stroke  Paternal Grandfather       CVA    .  Coronary artery disease  Paternal Grandfather     ROS: He has not tolerated a beta blocker in the past because of the fatigue he gave him. He feels that he is getting atrial fibrillation for at least a couple of hours every day over the past month. However, when he feels the palpitations he will  check his pulse and doesn't feel like it's going fast. During these episodes, he denies any symptoms but seems to feel a little tired and not be able to do quite as much according to his wife. He denies melena or any GI symptoms. He has no recent illnesses, fever or chills. Full 14 point review of systems complete and found to be negative unless listed above.  Physical Exam:  Blood pressure 125/62, pulse 151, temperature 97.6 F (36.4 C), temperature source Oral, resp. rate 20, height 6\' 3"  (1.905 m), weight 250 lb (113.399 kg), SpO2 100.00%.  General: Well developed, well nourished, male in no acute distress  Head: Eyes PERRLA, No xanthomas. Normocephalic and atraumatic, oropharynx without edema or exudate. Dentition - good  Lungs: Clear bilaterally  Heart: Heart irregular rate and rhythm with S1, S2 murmur. pulses are 2+ all 4 extrem.  Neck: No carotid bruits. No lymphadenopathy. JVD not elevated.  Abdomen: Bowel sounds present, abdomen soft and non-tender without masses or hernias noted.  Msk: No spine or cva tenderness. No weakness, no joint deformities or effusions.  Extremities: No clubbing or cyanosis. No edema.  Neuro: Alert and oriented X 3. No focal deficits noted.  Psych: Good affect, responds appropriately  Skin: No rashes noted. He has multiple psoriasis plaques but no open areas.  Labs:  Lab Results   Component  Value  Date    WBC  5.5  10/22/2011    HGB  13.4  10/22/2011    HCT  39.8  10/22/2011    MCV  83.1  10/22/2011    PLT  191  10/22/2011     Basename  10/22/11 1041   INR  0.98    Lab  10/22/11 1041   NA  140   K  4.1   CL  104   CO2  24   BUN  16   CREATININE  0.86   CALCIUM  9.1  PROT  7.3   BILITOT  0.3   ALKPHOS  85   ALT  27   AST  33   GLUCOSE  115*     Basename  10/22/11 1042   CKTOTAL  202   CKMB  4.7*   TROPONINI  <0.30    Pro B Natriuretic peptide (BNP)   Date/Time  Value  Range  Status   09/11/2007 4:40 AM  50.3   Final   11/25/2006 9:43 AM   15.0  0.0-100.0 pg/mL  Final    TSH   Date/Time  Value  Range  Status   09/30/2011 8:50 AM  1.74  0.35 - 5.50 uIU/mL  Final    Echo: 10/02/2011  Study Conclusions - Left ventricle: The cavity size was at the upper limits of normal. Wall thickness was increased in a pattern of mild LVH. Systolic function was normal. The estimated ejection fraction was in the range of 60% to 65%. Wall motion was normal; there were no regional wall motion abnormalities. Features are consistent with a pseudonormal left ventricular filling pattern, with concomitant abnormal relaxation and increased filling pressure (grade 2 diastolic dysfunction). - Aortic valve: Trivial regurgitation. - Left atrium: The atrium was mildly to moderately dilated. - Atrial septum: No defect or patent foramen ovale was identified. - Pulmonary arteries: PA peak pressure: 39mm Hg (S).  ECG: 22-Oct-2011 10:00:57  Sinus tachycardia  Otherwise normal ECG  Changed from 08-17-09 when ECG showed Atrial fibrillation  52mm/s 64mm/mV 100Hz  8.0.1 12SL 241 HD CID: [id number]  Referred by: Confirmed By: Emeline General MD  Vent. rate 155 BPM  PR interval 136 ms  QRS duration 94 ms  QT/QTc 274/440 ms  P-R-T axes * 60 49  Radiology: No results found.  ASSESSMENT AND PLAN: The patient was seen today by Dr. Excell Seltzer, the patient evaluated and the data reviewed.  Principal Problem:  *Atrial fibrillation - Mr. Landgrebe has been having episodes of atrial fibrillation daily for at least a month. He does not know how fast his heart rate is going but feels that he can tell when he is in it. He is very sure that the current episode started at 10:30 last night. Generally he is able to rest and sleep to get rid of his symptoms. Occasionally he needs a when necessary Cardizem but has not taken in at least a month. The when necessary Cardizem was no help last night. His condition is improved on the IV Cardizem he has received in the emergency room. Discussed the  situation with the patient and his family. Advised them he would be to be admitted to the hospital. Consideration can be given to med changes with possible antiarrhythmics and a change in his anticoagulation. He has had side effects in the past with beta blockers, including bradycardia and significant fatigue so we will try to avoid these. His CHADS score is 1, continue heparin for now, M.D. advise on full-strength aspirin versus other anticoagulation.  Active Problems:  HYPERTENSION - good control on current medication  Signed:  Theodore Demark  10/22/2011, 11:34 AM  Co-Sign MD  Patient seen, examined. Available data reviewed. Agree with findings, assessment, and plan as outlined by Theodore Demark, PA-C. I have independently examined and interviewed the patient. I have reviewed his outpatient cardiology visits and noninvasive data. He has normal LV function and a history of nonobstructive CAD at cath. He remains in atrial fibrillation, now rate-controlled on IV diltiazem. He is also on IV heparin. I think a  reasonable approach is to give him flecainide 300 mg once to try to chemically convert him to sinus, then to start flecainide 100 mg BID. Will have him follow-up with Dr Johney Frame, either in the hospital tomorrow morning if he remains in atrial fibrillation, or as an outpatient if he converts to NSR.  Tonny Bollman, M.D.  10/22/2011  2:14 PM

## 2011-10-22 NOTE — Consult Note (Signed)
CARDIOLOGY CONSULT NOTE   Patient ID: Kenneth [name] MRN: [id number] DOB/AGE: 66-Jan-1947 66 y.o.  Admit date: 10/22/2011  Primary Physician   Marga Melnick, MD Primary Cardiologist   [facility] Reason for Consultation   Palpitations  WUJ:WJXBJ Kenneth [name] is a 66 y.o. male with a history of atrial arrhythmias but no CAD.  He has been having more frequent episodes of atrial fib lately. They happen during the day and can last several hours. He feels they will resolve if he goes to sleep. He feels a little tired and has some DOE but denies chest pain, SOB at rest, presyncope or syncope. He occasionally takes his PO cardizem but has not done that in over a month.  Last pm, he had sudden onset of tachypalpitations at about 10:30 pm. His heart rate was elevated but he does not know how much. He was a little dizzy with this and really felt his heart pounding. He developed some chest pain - soreness - after the palpitations had been going on a while. He took 30 mg cardizem at 11 PM and took another dose at approximately 2 AM. He did not sleep very much. This a.m. when his symptoms had not improved, he took his blood pressure and heart rate. His blood pressure was approximately 100 systolic and his heart rate was in the 150s. He contacted our office and came to the emergency room as instructed. In the emergency room, he received IV Cardizem with a bolus and drip. He also was started on IV heparin. His heart rate control has improved and his heart rate is generally in the 90s to 110s on Cardizem at 5 mg per hour. His chest pain has resolved.   Past Medical History  Diagnosis Date  . Atrial fib/flutter, transient   . HBP (high blood pressure)   . Psoriasis   . Dyspepsia   . Deafness   . Kidney stones   . Hyperlipidemia   . Macular degeneration   . Pneumonia 2009    in context of Flu     Past Surgical History  Procedure Date  . Rotator cuff repair   . A fib ablation     No Known Allergies  I  have reviewed the patient's current medications    . sodium chloride   Intravenous Once  . diltiazem  20 mg Intravenous Once  . heparin  5,000 Units Intravenous Once  . DISCONTD: heparin  4,000 Units Intravenous Once      . diltiazem (CARDIZEM) infusion 5 mg/hr (10/22/11 1046)  . heparin      Medication Sig  aspirin EC 81 MG tablet Take 1 tablet (81 mg total) by mouth daily.  cetirizine (ZYRTEC) 10 MG tablet Take 10 mg by mouth daily.    diltiazem (CARDIZEM) 30 MG tablet Take 30 mg by mouth daily as needed. Take for elevated heart rate may repeat in 2 hour if needed  fluticasone (FLONASE) 50 MCG/ACT nasal spray Place 1 spray into the nose daily as needed. For congestion  folic acid (FOLVITE) 1 MG tablet Take 1 mg by mouth daily.  methotrexate (RHEUMATREX) 2.5 MG tablet Take 7.5 mg by mouth. Caution:Chemotherapy. Protect from light. Take 3 tablets twice daily on Tuesday, 3 tablets on Wed morning  omeprazole  40 MG capsule Take 40 mg by mouth daily.     History   Social History  . Marital Status: Married    Spouse Name: N/A    Number of Children: N/A  .  Years of Education: N/A   Occupational History  . Not on file.   Social History Main Topics  . Smoking status: Former Smoker    Quit date: 09/29/2001  . Smokeless tobacco: Not on file  . Alcohol Use: No  . Drug Use: No  . Sexually Active: Not on file   Family History  Problem Relation Age of Onset  . Coronary artery disease Mother   . Cancer Mother     BREAST,COLON  . Heart disease Maternal Grandmother     MI  . Stroke Paternal Grandfather     CVA  . Coronary artery disease Paternal Grandfather      ROS: He has not tolerated a beta blocker in the past because of the fatigue he gave him. He feels that he is getting atrial fibrillation for at least a couple of hours every day over the past month. However, when he feels the palpitations he will check his pulse and doesn't feel like it's going fast. During these  episodes, he denies any symptoms but seems to feel a little tired and not be able to do quite as much according to his wife. He denies melena or any GI symptoms. He has no recent illnesses, fever or chills.  Full 14 point review of systems complete and found to be negative unless listed above.  Physical Exam: Blood pressure 125/62, pulse 151, temperature 97.6 F (36.4 C), temperature source Oral, resp. rate 20, height 6\' 3"  (1.905 m), weight 250 lb (113.399 kg), SpO2 100.00%.  General: Well developed, well nourished, male in no acute distress Head: Eyes PERRLA, No xanthomas.   Normocephalic and atraumatic, oropharynx without edema or exudate. Dentition - good Lungs: Clear bilaterally  Heart: Heart irregular rate and rhythm with S1, S2  murmur. pulses are 2+ all 4 extrem.   Neck: No carotid bruits. No lymphadenopathy.  JVD not elevated. Abdomen: Bowel sounds present, abdomen soft and non-tender without masses or hernias noted. Msk:  No spine or cva tenderness. No weakness, no joint deformities or effusions. Extremities: No clubbing or cyanosis. No edema.  Neuro: Alert and oriented X 3. No focal deficits noted. Psych:  Good affect, responds appropriately Skin: No rashes noted. He has multiple psoriasis plaques but no open areas.  Labs:   Lab Results  Component Value Date   WBC 5.5 10/22/2011   HGB 13.4 10/22/2011   HCT 39.8 10/22/2011   MCV 83.1 10/22/2011   PLT 191 10/22/2011    Basename 10/22/11 1041  INR 0.98    Lab 10/22/11 1041  NA 140  K 4.1  CL 104  CO2 24  BUN 16  CREATININE 0.86  CALCIUM 9.1  PROT 7.3  BILITOT 0.3  ALKPHOS 85  ALT 27  AST 33  GLUCOSE 115*    Basename 10/22/11 1042  CKTOTAL 202  CKMB 4.7*  TROPONINI <0.30    Pro B Natriuretic peptide (BNP)  Date/Time Value Range Status  09/11/2007  4:40 AM 50.3   Final  11/25/2006  9:43 AM 15.0  0.0-100.0 pg/mL Final     TSH  Date/Time Value Range Status  09/30/2011  8:50 AM 1.74  0.35 - 5.50 uIU/mL Final    Echo: 10/02/2011 Study Conclusions - Left ventricle: The cavity size was at the upper limits of normal. Wall thickness was increased in a pattern of mild LVH. Systolic function was normal. The estimated ejection fraction was in the range of 60% to 65%. Wall motion was normal; there were no regional wall  motion abnormalities. Features are consistent with a pseudonormal left ventricular filling pattern, with concomitant abnormal relaxation and increased filling pressure (grade 2 diastolic dysfunction). - Aortic valve: Trivial regurgitation. - Left atrium: The atrium was mildly to moderately dilated. - Atrial septum: No defect or patent foramen ovale was identified. - Pulmonary arteries: PA peak pressure: 39mm Hg (S).  ECG: 22-Oct-2011 10:00:57  Sinus tachycardia Otherwise normal ECG Changed from 08-17-09 when ECG showed Atrial fibrillation 22mm/s 66mm/mV 100Hz  8.0.1 12SL 241 HD CID: 4 Referred by: Confirmed By: Emeline General MD Vent. rate 155 BPM PR interval 136 ms QRS duration 94 ms QT/QTc 274/440 ms P-Kenneth-T axes * 60 49  Radiology:  No results found.  ASSESSMENT AND PLAN:   The patient was seen today by Dr. Excell Seltzer, the patient evaluated and the data reviewed.   Principal Problem:  *Atrial fibrillation - Kenneth [name] has been having episodes of atrial fibrillation daily for at least a month. He does not know how fast his heart rate is going but feels that he can tell when he is in it. He is very sure that the current episode started at 10:30 last night. Generally he is able to rest and sleep to get rid of his symptoms. Occasionally he needs a when necessary Cardizem but has not taken in at least a month. The when necessary Cardizem was no help last night. His condition is improved on the IV Cardizem he has received in the emergency room. Discussed the situation with the patient and his family. Advised them he would be to be admitted to the hospital. Consideration can be given to med  changes with possible antiarrhythmics and a change in his anticoagulation. He has had side effects in the past with beta blockers, including bradycardia and significant fatigue so we will try to avoid these. His CHADS score is 1, continue heparin for now, M.D. advise on full-strength aspirin versus other anticoagulation.   Active Problems:  HYPERTENSION - good control on current medication   Signed: Theodore Demark 10/22/2011, 11:34 AM Co-Sign MD  Patient seen, examined. Available data reviewed. Agree with findings, assessment, and plan as outlined by Theodore Demark, PA-C. I have independently examined and interviewed the patient. I have reviewed his outpatient cardiology visits and noninvasive data. He has normal LV function and a history of nonobstructive CAD at cath. He remains in atrial fibrillation, now rate-controlled on IV diltiazem. He is also on IV heparin. I think a reasonable approach is to give him flecainide 300 mg once to try to chemically convert him to sinus, then to start flecainide 100 mg BID. Will have him follow-up with Dr Johney Frame, either in the hospital tomorrow morning if he remains in atrial fibrillation, or as an outpatient if he converts to NSR.  Tonny Bollman, M.D. 10/22/2011 2:14 PM

## 2011-10-22 NOTE — ED Notes (Signed)
[place] cards into see pt

## 2011-10-22 NOTE — ED Notes (Signed)
Hr in 150's and steadily rising, hx of atrial ablation,

## 2011-10-22 NOTE — ED Notes (Signed)
Spoke with Dr.Cooper regarding continued atrial fib despite Tambocor and Cardizem drip; states to proceed with admission - pt made aware of same

## 2011-10-22 NOTE — ED Provider Notes (Signed)
History     CSN: [id number]  Arrival date & time 10/22/11  4098   First MD Initiated Contact with Patient 10/22/11 1010      Chief Complaint  Patient presents with  . Irregular Heart Beat    (Consider location/radiation/quality/duration/timing/severity/associated sxs/prior treatment) HPI Comments: Patient has history of atrial fibrillation for which he takes Cardizem intermittently as needed. For presents with palpitations and racing heartbeat for the past 12 hours. He took Cardizem twice without relief. He is intermittent chest soreness. Denies any shortness of breath, nausea, vomiting or fever. He is on aspirin but not Coumadin.  The history is provided by the patient.    Past Medical History  Diagnosis Date  . Atrial fib/flutter, transient   . HBP (high blood pressure)   . Psoriasis   . Dyspepsia   . Deafness   . Kidney stones   . Hyperlipidemia   . Macular degeneration   . Pneumonia 2009    in context of Flu    Past Surgical History  Procedure Date  . Rotator cuff repair   . A fib ablation     Family History  Problem Relation Age of Onset  . Coronary artery disease Mother   . Cancer Mother     BREAST,COLON  . Heart disease Maternal Grandmother     MI  . Stroke Paternal Grandfather     CVA  . Coronary artery disease Paternal Grandfather     History  Substance Use Topics  . Smoking status: Former Smoker    Quit date: 09/29/2001  . Smokeless tobacco: Not on file  . Alcohol Use: No      Review of Systems  Constitutional: Negative for fever, activity change and appetite change.  HENT: Negative for congestion, rhinorrhea and trouble swallowing.   Eyes: Negative for visual disturbance.  Respiratory: Positive for chest tightness. Negative for cough and shortness of breath.   Cardiovascular: Positive for palpitations. Negative for chest pain.  Gastrointestinal: Negative for nausea, vomiting and abdominal pain.  Genitourinary: Negative for dysuria.    Musculoskeletal: Negative for back pain.  Skin: Negative for rash.  Neurological: Negative for dizziness and syncope.    Allergies  Review of patient's allergies indicates no known allergies.  Home Medications   Current Outpatient Rx  Name Route Sig Dispense Refill  . CETIRIZINE HCL 10 MG PO TABS Oral Take 10 mg by mouth daily.      Marland Kitchen DILTIAZEM HCL 30 MG PO TABS Oral Take 30 mg by mouth daily as needed. Take for elevated heart rate may repeat in 2 hour if needed    . FLUTICASONE PROPIONATE 50 MCG/ACT NA SUSP Nasal Place 1 spray into the nose daily as needed. For congestion    . FOLIC ACID 1 MG PO TABS Oral Take 1 mg by mouth daily.    Marland Kitchen METHOTREXATE 2.5 MG PO TABS Oral Take 7.5 mg by mouth See admin instructions. Caution:Chemotherapy. Protect from light. Take 3 tablets twice daily on Tuesday, 3 tablets on Wed morning    . OMEPRAZOLE 40 MG PO CPDR Oral Take 40 mg by mouth daily.    . ASPIRIN EC 325 MG PO TBEC Oral Take 1 tablet (325 mg total) by mouth daily. 90 tablet 3  . FLECAINIDE ACETATE 100 MG PO TABS Oral Take 1 tablet (100 mg total) by mouth 2 (two) times daily. 60 tablet 11    BP 115/61  Pulse 102  Temp 98.6 F (37 C) (Oral)  Resp 18  Ht 6\' 3"  (1.905 m)  Wt 250 lb (113.399 kg)  BMI 31.25 kg/m2  SpO2 99%  Physical Exam  Constitutional: He is oriented to person, place, and time. He appears well-developed and well-nourished. No distress.  HENT:  Head: Normocephalic.  Eyes: Conjunctivae and EOM are normal. Pupils are equal, round, and reactive to light.  Neck: Normal range of motion. Neck supple.  Cardiovascular: Normal rate and normal heart sounds.   No murmur heard.      Tachycardic irregular rhythm  Pulmonary/Chest: Effort normal and breath sounds normal. No respiratory distress.  Abdominal: Soft. There is no tenderness. There is no rebound and no guarding.  Musculoskeletal: Normal range of motion. He exhibits no edema and no tenderness.  Neurological: He is alert  and oriented to person, place, and time. No cranial nerve deficit.  Skin: Skin is warm.    ED Course  Procedures (including critical care time)  Labs Reviewed  CBC WITH DIFFERENTIAL - Abnormal; Notable for the following:    RDW 16.0 (*)     All other components within normal limits  COMPREHENSIVE METABOLIC PANEL - Abnormal; Notable for the following:    Glucose, Bld 115 (*)     GFR calc non Af Amer 88 (*)     All other components within normal limits  CARDIAC PANEL(CRET KIN+CKTOT+MB+TROPI) - Abnormal; Notable for the following:    CK, MB 4.7 (*)     All other components within normal limits  PROTIME-INR  URINALYSIS, ROUTINE W REFLEX MICROSCOPIC  HEPARIN LEVEL (UNFRACTIONATED)   Dg Chest Portable 1 View  10/22/2011  *RADIOLOGY REPORT*  Clinical Data: Arrhythmia.  Tachycardia.  PORTABLE CHEST - 1 VIEW  Comparison: 02/21/2011  Findings: Heart size and pulmonary vascularity are normal and the lungs are clear.  No acute osseous abnormality. There appears to have been previous resection of the distal left clavicle.  IMPRESSION: No acute abnormalities.  Original Report Authenticated By: Gwynn Burly, M.D.     No diagnosis found.    MDM  Palpitations with history of atrial fibrillation.  Chest tightness without pain.    IV cardizem for rate control, labs, CXR, d/w cardiology.  Atrial fibrillation and rapid ventricular response, stable BP. Patient given Cardizem bolus and drip. Heart rate decreased to 70-80 still in atrial fibrillation. Cardiology at bedside. They plan to give flecainide. They will monitor for conversion to sinus. If patient does not convert he will need admission for ablation.   Date: 10/22/2011  Rate: 155  Rhythm: atrial fibrillation  QRS Axis: normal  Intervals: normal  ST/T Wave abnormalities: normal  Conduction Disutrbances:none  Narrative Interpretation:   Old EKG Reviewed: changes noted  CRITICAL CARE Performed by: Glynn Octave   Total  critical care time: 30  Critical care time was exclusive of separately billable procedures and treating other patients.  Critical care was necessary to treat or prevent imminent or life-threatening deterioration.  Critical care was time spent personally by me on the following activities: development of treatment plan with patient and/or surrogate as well as nursing, discussions with consultants, evaluation of patient's response to treatment, examination of patient, obtaining history from patient or surrogate, ordering and performing treatments and interventions, ordering and review of laboratory studies, ordering and review of radiographic studies, pulse oximetry and re-evaluation of patient's condition.            Glynn Octave, MD 10/22/11 1536

## 2011-10-22 NOTE — Progress Notes (Signed)
ANTICOAGULATION CONSULT NOTE - Initial Consult  Pharmacy Consult for Heparin Indication: atrial fibrillation  No Known Allergies  Patient Measurements:   TBW 113.4 kg (stated) IBW 84.5 kg Heparin Dosing Weight: 108 kg  Vital Signs: Temp: 97.6 F (36.4 C) (07/16 0958) Temp src: Oral (07/16 0958) BP: 125/62 mmHg (07/16 0958) Pulse Rate: 151  (07/16 0958)  Labs: No results found for this basename: HGB:2,HCT:3,PLT:3,APTT:3,LABPROT:3,INR:3,HEPARINUNFRC:3,CREATININE:3,CKTOTAL:3,CKMB:3,TROPONINI:3 in the last 72 hours  The CrCl is unknown because both a height and weight (above a minimum accepted value) are required for this calculation.   Medical History: Past Medical History  Diagnosis Date  . Atrial fib/flutter, transient   . HBP (high blood pressure)   . Psoriasis   . Dyspepsia   . Deafness   . Kidney stones   . Hyperlipidemia   . Macular degeneration   . Pneumonia 2009    in context of Flu     Assessment: 66 yo male with rapid AFib in 150's to start on heparin infusion. Pt states that he is not on any blood thinners, only takes ASA 81 mg daily. Also on diltiazem 30 mg PO PRN at home. CBC ok.   Goal of Therapy:  Heparin level 0.3-0.7 units/ml Monitor platelets by anticoagulation protocol: Yes   Plan:  Give 5000 units bolus x 1 Start heparin infusion at 1500 units/hr Heparin level in 6h at 1730 Daily heparin level and CBC  Maudry Mayhew N 10/22/2011,10:47 AM

## 2011-10-22 NOTE — ED Notes (Signed)
Pt made aware of need of urine specimen, urinal handed to pt to use at his convenience; family at bedside

## 2011-10-22 NOTE — Telephone Encounter (Signed)
Patient's wife called stated patient's heart out of rhythm and racing.Stated last night at 11:00 pm heart racing he took cardizem 30 mg.Took another cardizem 30 mg at 2:00 am.Patient took another cardizem 30 mg at 8:30 am and heart rate 153 beats/min.States patient is weak and no energy.Advised to take patient to [facility] ER.Rosann Auerbach was called and told.

## 2011-10-22 NOTE — ED Notes (Addendum)
resting quietly on stretcher with family at bedside; ccm remains atrial fib rate b/w 80 - 90; denies any c/o pain or other discomfort; skin pink, warm, dry; pt aware of 2 hr hold to attempt to see if Tambocor converts his rhythm

## 2011-10-22 NOTE — ED Notes (Signed)
Pt resting no discomfort at this time

## 2011-10-22 NOTE — Telephone Encounter (Signed)
New msg Pt's wife said his heart is racing, his pulse rate is 153. He has taken cardizem. Please call

## 2011-10-23 ENCOUNTER — Encounter (HOSPITAL_COMMUNITY): Payer: Self-pay | Admitting: Internal Medicine

## 2011-10-23 ENCOUNTER — Encounter (HOSPITAL_COMMUNITY)
Admission: EM | Disposition: A | Discharge status: Home / Self Care | Payer: Self-pay | Source: Home / Self Care | Attending: Cardiovascular Disease

## 2011-10-23 DIAGNOSIS — I4892 Unspecified atrial flutter: Secondary | ICD-10-CM

## 2011-10-23 HISTORY — PX: CARDIOVERSION: SHX1299

## 2011-10-23 LAB — CARDIAC PANEL(CRET KIN+CKTOT+MB+TROPI)
CK, MB: 3.5 ng/mL (ref 0.3–4.0)
CK, MB: 3.7 ng/mL (ref 0.3–4.0)
Troponin I: <0.3 ng/mL (ref <0.30)

## 2011-10-23 LAB — HEPARIN LEVEL (UNFRACTIONATED): Heparin Unfractionated: 0.35 IU/mL (ref 0.30–0.70)

## 2011-10-23 LAB — CBC
HCT: 38.5 % — ABNORMAL LOW (ref 39.0–52.0)
Platelets: 211 10*3/uL (ref 150–400)
RDW: 16.4 % — ABNORMAL HIGH (ref 11.5–15.5)
WBC: 6.9 10*3/uL (ref 4.0–10.5)

## 2011-10-23 SURGERY — CARDIOVERSION
Anesthesia: LOCAL | Wound class: Clean

## 2011-10-23 MED ORDER — MIDAZOLAM HCL 5 MG/5ML IJ SOLN
INTRAMUSCULAR | Status: AC
  Filled 2011-10-23: qty 5

## 2011-10-23 MED ORDER — DILTIAZEM HCL 120 MG PO TABS: 120.0000 mg | ORAL_TABLET | Freq: Every day | ORAL | Status: DC

## 2011-10-23 MED ORDER — FENTANYL CITRATE 0.05 MG/ML IJ SOLN
INTRAMUSCULAR | Status: AC
  Filled 2011-10-23: qty 2

## 2011-10-23 MED ORDER — APIXABAN 5 MG PO TABS
5.0000 mg | ORAL_TABLET | Freq: Two times a day (BID) | ORAL | Status: DC
  Administered 2011-10-23: 5 mg via ORAL
  Filled 2011-10-23: qty 1

## 2011-10-23 MED ORDER — DEXTROSE-NACL 5-0.45 % IV SOLN
INTRAVENOUS | Status: DC
  Administered 2011-10-23: 14:00:00 via INTRAVENOUS

## 2011-10-23 MED ORDER — APIXABAN 5 MG PO TABS: 5.0000 mg | ORAL_TABLET | Freq: Two times a day (BID) | ORAL | Status: DC

## 2011-10-23 MED ORDER — FLECAINIDE ACETATE 100 MG PO TABS: 100.0000 mg | ORAL_TABLET | Freq: Two times a day (BID) | ORAL | Status: DC

## 2011-10-23 NOTE — Interval H&P Note (Signed)
History and Physical Interval Note:  10/23/2011 1:46 PM  [name]  has presented today for surgery, with the diagnosis of afib  The various methods of treatment have been discussed with the patient and family. After consideration of risks, benefits and other options for treatment, the patient has consented to  Procedure(s) (LRB): CARDIOVERSION (N/A) as a surgical intervention .  The patient's history has been reviewed, patient examined, no change in status, stable for surgery.  I have reviewed the patients' chart and labs.  Questions were answered to the patient's satisfaction.     Sherryl Manges

## 2011-10-23 NOTE — Progress Notes (Addendum)
ANTICOAGULATION CONSULT NOTE - Follow Up Consult  Pharmacy Consult for Heparin Indication: atrial fibrillation  No Known Allergies  Patient Measurements: Height: 6\' 3"  (190.5 cm) Weight: 260 lb 12.9 oz (118.3 kg) IBW/kg (Calculated) : 84.5  Heparin Dosing Weight: 108 kg  Vital Signs: Temp: 97.8 F (36.6 C) (07/17 0830) Temp src: Oral (07/17 0830) BP: 146/73 mmHg (07/17 0700) Pulse Rate: 86  (07/17 0700)  Labs:  Basename 10/23/11 0300 10/23/11 0238 10/22/11 2026 10/22/11 1702 10/22/11 1042 10/22/11 1041  HGB 12.9* -- -- -- -- 13.4  HCT 38.5* -- -- -- -- 39.8  PLT 211 -- -- -- -- 191  APTT -- -- -- -- -- --  LABPROT -- -- -- -- -- 13.2  INR -- -- -- -- -- 0.98  HEPARINUNFRC 0.35 -- -- 0.34 -- --  CREATININE -- -- -- -- -- 0.86  CKTOTAL -- 125 136 -- 202 --  CKMB -- 3.5 3.7 -- 4.7* --  TROPONINI -- <0.30 <0.30 -- <0.30 --    Estimated Creatinine Clearance: 117.1 ml/min (by C-G formula based on Cr of 0.86).  Assessment: 66 yo male on heparin for afib and heparin level at goal (HL=0.35).  CHADS score noted as 1.  Goal of Therapy:  Heparin level 0.3-0.7 units/ml Monitor platelets by anticoagulation protocol: Yes   Plan:   -Continue heparin to 1500 units/hr.  -Next heparin level and CBC in am. -Will follow patient course  Earnest Bailey D 10/23/2011 8:34 AM

## 2011-10-23 NOTE — Progress Notes (Addendum)
Have reviewed with pt the issues of anticoagulation with CHADSVASc score of2>>was on ASA and willswitch to apixoban. Also he has been given Rx for flecanide and will have him start 100 bid.  He is interested in for now deferring consideration of repeat catheter ablation but he has appt with JA Aug 22 for eval for afib.  I am glad to follow him for now if he doesn't want ablation   Will need av nodal blocking agent >>cardiazem 120 daily

## 2011-10-23 NOTE — Consult Note (Signed)
  Reason for Consult:Persistent atrial flutter  Referring Physician: Nakoa Ganus is an 66 y.o. male.   HPI: The patient is a pleasant 66 yo man with a h/o atrial fibrillation who underwent catheter ablation in 2006. He had done well until 6 months ago when he began to experience episodes of palpitations which would stop spontaneously. He was in his usual state of health until yesterday when he developed sob and presented with atrial flutter with an RVR. He was treated with 300 mg of flecainide and IV cardizem and his rate is now improved but he remains in atrial flutter, now with a controlled ventricular rate. He has not been on any systemic anticoagulation at home. He denies peripheral edema and he has remained active.  PMH: Past Medical History  Diagnosis Date  . Atrial fib/flutter, transient   . HBP (high blood pressure)   . Psoriasis   . Dyspepsia   . Deafness   . Kidney stones   . Hyperlipidemia   . Macular degeneration   . Pneumonia 2009    in context of Flu    PSHX: Past Surgical History  Procedure Date  . Rotator cuff repair   . A fib ablation     FAMHX: Family History  Problem Relation Age of Onset  . Coronary artery disease Mother   . Cancer Mother     BREAST,COLON  . Heart disease Maternal Grandmother     MI  . Stroke Paternal Grandfather     CVA  . Coronary artery disease Paternal Grandfather     Social History:  reports that he quit smoking about 10 years ago. He does not have any smokeless tobacco history on file. He reports that he does not drink alcohol or use illicit drugs.  Allergies: No Known Allergies  Medications: reviewed  Dg Chest Portable 1 View  10/22/2011  *RADIOLOGY REPORT*  Clinical Data: Arrhythmia.  Tachycardia.  PORTABLE CHEST - 1 VIEW  Comparison: 02/21/2011  Findings: Heart size and pulmonary vascularity are normal and the lungs are clear.  No acute osseous abnormality. There appears to have been previous resection of the  distal left clavicle.  IMPRESSION: No acute abnormalities.  Original Report Authenticated By: Gwynn Burly, M.D.    ROS  As stated in the HPI and negative for all other systems.  Physical Exam  Vitals:Blood pressure 146/73, pulse 86, temperature 97.8 F (36.6 C), temperature source Oral, resp. rate 20, height 6\' 3"  (1.905 m), weight 260 lb 12.9 oz (118.3 kg), SpO2 94.00%.  Well appearing middle aged man, NAD HEENT: Unremarkable Neck:  No JVD, no thyromegally Lungs:  Clear with no wheezes HEART:  IRegular rate rhythm, no murmurs, no rubs, no clicks Abd:  obese, positive bowel sounds, no organomegally, no rebound, no guarding Ext:  2 plus pulses, no edema, no cyanosis, no clubbing Skin:  No rashes no nodules Neuro:  CN II through XII intact, motor grossly intact  ECG - atrial flutter with a controlled VR  Assessment/Plan: 1. Persistent left atrial flutter 2. H/o atrial fib 3. HTN Rec: at this point I discussed the treatment options with the patient and his family. He has had breakfast at 0800. I have recommended proceeding with DCCV later today. He may require initiation of anti-arrhythmic drugs to prevent the recurrent atrial arrhythmias. His [place] score is one.   Sharlot Gowda TaylorMD 10/23/2011, 9:23 AM

## 2011-10-23 NOTE — Care Management Note (Signed)
    Page 1 of 1   10/23/2011     9:48:16 AM   CARE MANAGEMENT NOTE 10/23/2011  Patient:  Kenneth [name], Kenneth [name]   Account Number:  [id number]  Date Initiated:  10/23/2011  Documentation initiated by:  Junius Creamer  Subjective/Objective Assessment:   adm w at fib     Action/Plan:   lives w wife, pcp dr Chrissie Noa hopper   Anticipated DC Date:     Anticipated DC Plan:        DC Planning Services  CM consult      Choice offered to / List presented to:             Status of service:   Medicare Important Message given?   (If response is "NO", the following Medicare IM given date fields will be blank) Date Medicare IM given:   Date Additional Medicare IM given:    Discharge Disposition:    Per UR Regulation:  Reviewed for med. necessity/level of care/duration of stay  If discussed at Long Length of Stay Meetings, dates discussed:    Comments:  7/17 9:47a debbie Delrick Dehart rn,bsn [phone]

## 2011-10-23 NOTE — CV Procedure (Signed)
Preop Dx flutter Post op DX  NSR  Procedure  DC Cardioversion   Pt was sedatedwith versed 7mg  and fentanyl 125l  A synchronized shock 125 joules restored sinus Rhythm   Pt tolerated without difficulty

## 2011-10-23 NOTE — Progress Notes (Signed)
Patient being discharged with medication and discharge instructions. Teach Back Method used when teaching patient.

## 2011-10-23 NOTE — Discharge Summary (Signed)
Discharge Summary   Patient ID: Kenneth [name],  MRN: [id number], DOB/AGE: [birth year] 66 y.o.  Admit date: 10/22/2011 Discharge date: 10/23/2011  Primary Physician: Marga Melnick, MD Primary Cardiologist: Olga Millers, MD  Discharge Diagnoses Principal Problem:  *Atrial flutter Active Problems:  HYPERTENSION  Atrial fibrillation   Allergies No Known Allergies  Diagnostic Studies/Procedures  PORTABLE CHEST X-RAY- 10/22/11  Comparison: 02/21/2011  Findings: Heart size and pulmonary vascularity are normal and the  lungs are clear. No acute osseous abnormality. There appears to  have been previous resection of the distal left clavicle.  IMPRESSION:  No acute abnormalities.  DCCV- 10/23/11  Preop Dx flutter  Post op DX NSR   Pt was sedatedwith versed 7mg  and fentanyl 125l  A synchronized shock 125 joules restored sinus Rhythm   History of Present Illness  Kenneth [name] is a 66yo male with PMHx significant for the above problem list who was admitted to [facility] on 10/22/11 with paroxysmal atrial fibrillation/flutter + RVR.   The patient can typically tell when he is in atrial fibrillation by experiencing tachy-palpitations. He has a history of prior RFA in 2006. He had done well up until 6 months prior to admission when he began to experience intermittent, self-limiting episodes of tachy-palpitations. The night prior to admission, he experienced an episode of these with associated shortness of breath and took Cardizem PO PRN once and again several hours later without relief. He slept little, and symptoms continued into the morning. BP was found to be in the 100s, and HR 150s at home. He contacted the [employer] office and was instructed to report home. In the ED, he was found to be in atrial fibrillation with RVR. CXR as above revealed no acute abnormalities. Cardizem IV and gtt was started with rates improving to 90-110s. Heparin IV was started. The  patient was admitted to telemetry.   Hospital Course   He was started on a trial of flecainide to attempt a chemical cardioversion. Atrial fibrillation/flutter persisted, and EP was consulted the following morning. Dr. Ladona Ridgel saw him in consultation and options for short- and long-term management were discussed. He recommended DCCV later that day and the patient agreed to proceed. Cardio biomarkers had been cycled from the day prior, and returned WNL x 4.   The patient underwent DCCV performed by Dr. Graciela Husbands with successful conversion to NSR later that day. Anticoagulation was discussed with patient. Oral anticoagulation was deemed appropriate given the patient's CHADSVASc score of 2. Recommendations were made to prescribe apixaban for anticoagulation, continue flecainide and start on daily diltiazem PO. Additionally, the patient wished to discuss RFA options on follow-up. The patient tolerated the procedure well without complications. He was assessed by Dr. Graciela Husbands and found to be stable for discharge post-procedure. He will be discharged on the medications below. He will follow-up with Dr. Johney Frame in clinic in 4-6 weeks as previously scheduled and noted below. This information, including supplemental material for atrial fibrillation, has been clearly outlined in the discharge AVS.   Discharge Vitals:  Blood pressure 120/73, pulse 69, temperature 97.7 F (36.5 C), temperature source Oral, resp. rate 16, height 6\' 3"  (1.905 m), weight 118.3 kg (260 lb 12.9 oz), SpO2 99.00%.   Labs: Recent Labs  Basename 10/23/11 0300 10/22/11 1041   WBC 6.9 5.5   HGB 12.9* 13.4   HCT 38.5* 39.8   MCV 83.7 83.1   PLT 211 191   Lab 10/22/11 1041  NA 140  K 4.1  CL 104  CO2 24  BUN 16  CREATININE 0.86  CALCIUM 9.1  PROT 7.3  BILITOT 0.3  ALKPHOS 85  ALT 27  AST 33  AMYLASE --  LIPASE --  GLUCOSE 115*   Recent Labs  Basename 10/23/11 0854 10/23/11 0238 10/22/11 2026   CKTOTAL 141 125 136   CKMB 3.7  3.5 3.7   CKMBINDEX -- -- --   TROPONINI <0.30 <0.30 <0.30   Disposition:  Discharge Orders    Future Appointments: Provider: Department: Dept Phone: Center:   11/28/2011 2:30 PM Hillis Range, MD [facility] [phone] [facility]     Follow-up Information    Follow up with Hillis Range, MD. (August 22nd at 2:30 pm)    Contact information:   [address] [place], [address] [place] [address] [phone]         Discharge Medications:  Medication List  As of 10/23/2011  4:20 PM   START taking these medications         apixaban 5 MG Tabs tablet   Commonly known as: ELIQUIS   Take 1 tablet (5 mg total) by mouth 2 (two) times daily.      flecainide 100 MG tablet   Commonly known as: TAMBOCOR   Take 1 tablet (100 mg total) by mouth 2 (two) times daily.         CHANGE how you take these medications         diltiazem 120 MG tablet   Commonly known as: CARDIZEM   Take 1 tablet (120 mg total) by mouth daily.   What changed: - medication strength - dose - how often to take the med - reasons to take the med - doctor's instructions         CONTINUE taking these medications         cetirizine 10 MG tablet   Commonly known as: ZYRTEC      fluticasone 50 MCG/ACT nasal spray   Commonly known as: FLONASE      folic acid 1 MG tablet   Commonly known as: FOLVITE      methotrexate 2.5 MG tablet   Commonly known as: RHEUMATREX      omeprazole 40 MG capsule   Commonly known as: PRILOSEC         STOP taking these medications         aspirin EC 81 MG tablet          Where to get your medications    These are the prescriptions that you need to pick up. We sent them to a specific pharmacy, so you will need to go there to get them.   [facility] - [place], [place] - [address]    [address] [place] [address]    Phone: [phone]        apixaban 5 MG Tabs tablet   diltiazem 120 MG tablet   flecainide 100 MG tablet            Outstanding Labs/Studies: None  Duration of Discharge Encounter: Greater than 30 minutes including physician time.  Signed, R. Hurman Horn, PA-C 10/23/2011, 4:20 PM

## 2011-11-01 ENCOUNTER — Telehealth: Payer: Self-pay | Admitting: Cardiology

## 2011-11-01 NOTE — Telephone Encounter (Signed)
Pt on flecanide and having dizziness, can he get med changed? pls call [phone] , [facility]

## 2011-11-01 NOTE — Telephone Encounter (Signed)
F/u from previous call.  Patient returning call back to nurse  

## 2011-11-01 NOTE — Telephone Encounter (Signed)
Stay off flecanide and fu with Dr Johney Frame for discussion of other antiarrhythmics vs ablation Kenneth [name]

## 2011-11-01 NOTE — Telephone Encounter (Signed)
Spoke with pt wife, Aware of dr Ludwig Clarks recommendations. They are aware of appt with dr allred 11-28-11.

## 2011-11-01 NOTE — Telephone Encounter (Signed)
Spoke with pt, he is having dizziness since he was started on the flecainide. He is dizzy pretty much all the time. It does seem to get worse with lying down and turning his head. Pt reports he has had vertigo before and states this is different. He wants to change to a different antiarrhythmic. Okay given for pt to stop the flecainide, he understands he may go out of rhythm. Will forward for dr Jens Som review

## 2011-11-28 ENCOUNTER — Ambulatory Visit (INDEPENDENT_AMBULATORY_CARE_PROVIDER_SITE_OTHER): Payer: Medicare Other | Admitting: Internal Medicine

## 2011-11-28 ENCOUNTER — Encounter: Payer: Self-pay | Admitting: Internal Medicine

## 2011-11-28 VITALS — BP 136/80 | HR 71 | Resp 18 | Ht 75.0 in | Wt 259.1 lb

## 2011-11-28 DIAGNOSIS — I4891 Unspecified atrial fibrillation: Secondary | ICD-10-CM

## 2011-11-28 NOTE — Progress Notes (Signed)
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  He reports initially being diagnosed with atrial fibrillation more than 7 years ago.  He does not recall which antiarrhythmic drugs he tried at that time.  He states that he did not require cardioversion but interestingly found that when he was shocked [place] that he would convert to sinus rhythm.  He found himself "self cardioverting" with the electric fence intentionally on a frequent basis.  He was referred to Dr Delena Serve and underwent afib ablation at [facility] in 2006.  He had a very nice response to ablation and did well, without symptomatic recurrence until March/April of this year.  He reports that in late March, he went on vacation in [place].  He drank a significant amount of herbal tea.  He then began having recurrence of afib.  He is quite certain that this recurrence was due to the tea.  He was evaluated recently by Dr Graciela Husbands and underwent cardioversion.  He was placed on flecainide and apixaban.  He has maintained sinus rhythm since that time but reports symptoms of dizziness with flecainide.  This dizziness is most noticeable when turning his head and is the typical reaction described with flecainide.   Today, he denies symptoms of palpitations, chest pain, shortness of breath, orthopnea, PND, lower extremity edema,  presyncope, syncope, or neurologic sequela. The patient is tolerating medications without difficulties and is otherwise without complaint today.   Past Medical History  Diagnosis Date  . Paroxysmal atrial fibrillation   . Psoriasis   . Dyspepsia   . Deafness   . Kidney stones   . Hyperlipidemia   . Macular degeneration     pt reports that he actually has a "retina problem' not MD  . Pneumonia 2009    in context of Flu   Past Surgical  History  Procedure Date  . Rotator cuff repair   . Atrial fibrillation ablation 2006    Dr Delena Serve at [facility]    Current Outpatient Prescriptions  Medication Sig Dispense Refill  . cetirizine (ZYRTEC) 10 MG tablet Take 10 mg by mouth daily.        . fluticasone (FLONASE) 50 MCG/ACT nasal spray Place 1 spray into the nose daily as needed. For congestion      . folic acid (FOLVITE) 1 MG tablet Take 1 mg by mouth daily.      . methotrexate (RHEUMATREX) 2.5 MG tablet Take 7.5 mg by mouth See admin instructions. Caution:Chemotherapy. Protect from light. Take 3 tablets twice daily on Tuesday, 3 tablets on Wed morning      . omeprazole (PRILOSEC) 40 MG capsule Take 40 mg by mouth daily.        No Known Allergies  History   Social History  . Marital Status: Married    Spouse Name: N/A    Number of Children: N/A  . Years of Education: N/A   Occupational History  . Not on file.   Social History Main Topics  . Smoking status: Former Smoker    Quit date: 09/29/2001  . Smokeless tobacco: Not on file  . Alcohol Use: No  . Drug Use: No  . Sexually Active: Not on file   Other Topics Concern  . Not on file   Social History Narrative   Pt  lives in [place] with spouse.  Retired.  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    Family History  Problem Relation Age of Onset  . Coronary artery disease Mother   . Cancer Mother     BREAST,COLON  . Heart disease Maternal Grandmother     MI  . Stroke Paternal Grandfather     CVA  . Coronary artery disease Paternal Grandfather     ROS- All systems are reviewed and negative except as per the HPI above  Physical Exam: Filed Vitals:   11/28/11 1448  BP: 136/80  Pulse: 71  Resp: 18  Height: 6\' 3"  (1.905 m)  Weight: 259 lb 1.9 oz (117.536 kg)  SpO2: 97%    GEN- The patient is well appearing, alert and oriented x 3 today.   Head- normocephalic, atraumatic Eyes-  Sclera clear, conjunctiva pink Ears- hearing intact Oropharynx-  clear Neck- supple, no JVP Lymph- no cervical lymphadenopathy Lungs- Clear to ausculation bilaterally, normal work of breathing Heart- Regular rate and rhythm, no murmurs, rubs or gallops, PMI not laterally displaced GI- soft, NT, ND, + BS Extremities- no clubbing, cyanosis, or edema MS- no significant deformity or atrophy Skin- no rash or lesion Psych- euthymic mood, full affect Neuro- strength and sensation are intact  EKG today reveals sinus rhythm 71 bpm, PR 170, QRS 86, Qtc 449, otherwise normal ekg Echo 10/02/11- EF 60-65%, trivial MR, LA size 48mm  Assessment and Plan:

## 2011-11-28 NOTE — Patient Instructions (Signed)
Your physician recommends that you schedule a follow-up appointment in: 3 months with Dr Johney Frame   Your physician has recommended you make the following change in your medication:  1) Stop Flecainide  2) In 2 weeks stop Diltiazem 3) In 4 weeks stop Eliquis

## 2011-11-29 ENCOUNTER — Encounter: Payer: Medicare Other | Admitting: Cardiology

## 2011-11-30 ENCOUNTER — Encounter: Payer: Self-pay | Admitting: Internal Medicine

## 2011-11-30 NOTE — Assessment & Plan Note (Signed)
The patient has symptomatic atrial fibrillation.  He is s/p ablation by Dr Delena Serve in 2006 and has had an excellent response.  He presents with recurrent afib over the past few months and has recently required cardioversion.  He is convinced that his recurrence was precipitated by herbal tea.  Though he is maintaining sinus with flecainide, he reports dizziness with this medicine.  He is quite clear that he would like to stop flecainide.  Therapeutic strategies for afib including medicine and ablation were discussed in detail with the patient today. Risk, benefits, and alternatives to EP study and radiofrequency ablation for afib were also discussed in detail today.  At this time, he would like to stop flecainide and see if his afib returns.  (He thinks that as he no longer drinks Chinese tea that his afib may not return).  If he has further afib, then he would like to proceed with repeat ablation.  I have therefore instructed him to stop flecainide today.  In two weeks if he remains in sinus then he will stop diltiazem.  In 4 weeks if he remains in sinus then he will stop apixaban (CHADS2 score is 0, CHADSVASC score is 1).  If we decide to proceed with ablation then I would start pradaxa or xarelto as there if limited experience with apixaban and afib ablation.  He will return in 3 months for further dicussion.

## 2011-12-26 ENCOUNTER — Encounter: Payer: Self-pay | Admitting: Gastroenterology

## 2012-03-02 ENCOUNTER — Ambulatory Visit (INDEPENDENT_AMBULATORY_CARE_PROVIDER_SITE_OTHER): Payer: Medicare Other | Admitting: Internal Medicine

## 2012-03-02 ENCOUNTER — Encounter: Payer: Self-pay | Admitting: Internal Medicine

## 2012-03-02 VITALS — BP 132/70 | HR 80 | Ht 75.0 in | Wt 262.0 lb

## 2012-03-02 DIAGNOSIS — I4891 Unspecified atrial fibrillation: Secondary | ICD-10-CM

## 2012-03-02 NOTE — Assessment & Plan Note (Signed)
The patient has symptomatic atrial fibrillation.  He is s/p ablation by Dr Delena Serve in 2006 and has had an excellent response. He remains convinced that his recent afib recurrence was precipitated by herbal tea.  Upon stopping this, he has had no further symptoms of afib. He is clear that  He would like to avoid ablation at this time.  He will follow-up with Drs Jens Som and Graciela Husbands and I will see him as needed going forward.

## 2012-03-02 NOTE — Progress Notes (Signed)
PCP: Marga Melnick, MD Primary Cardiologist:  Jens Som Primary EP:  Kenneth [name] is a 66 y.o. male who presents today for routine electrophysiology followup.  Since last being seen in our clinic, the patient reports doing very well.  He is unaware of any further afib.  Today, he denies symptoms of palpitations, chest pain, shortness of breath,  lower extremity edema, dizziness, presyncope, or syncope.  The patient is otherwise without complaint today.   Past Medical History  Diagnosis Date  . Paroxysmal atrial fibrillation   . Psoriasis   . Dyspepsia   . Deafness   . Kidney stones   . Hyperlipidemia   . Macular degeneration     pt reports that he actually has a "retina problem' not MD  . Pneumonia 2009    in context of Flu   Past Surgical History  Procedure Date  . Rotator cuff repair   . Atrial fibrillation ablation 2006    Dr Delena Serve at [facility]    Current Outpatient Prescriptions  Medication Sig Dispense Refill  . cetirizine (ZYRTEC) 10 MG tablet Take 10 mg by mouth daily.        Marland Kitchen etanercept (ENBREL) 50 MG/ML injection Inject 50 mg into the skin 2 (two) times a week.      . fluticasone (FLONASE) 50 MCG/ACT nasal spray Place 1 spray into the nose daily as needed. For congestion      . folic acid (FOLVITE) 1 MG tablet Take 1 mg by mouth daily.      Marland Kitchen omeprazole (PRILOSEC) 40 MG capsule Take 40 mg by mouth daily.      Marland Kitchen zolpidem (AMBIEN) 10 MG tablet Take 10 mg by mouth at bedtime as needed.        Physical Exam: Filed Vitals:   03/02/12 0910  BP: 132/70  Pulse: 80  Height: 6\' 3"  (1.905 m)  Weight: 262 lb (118.842 kg)  SpO2: 97%    GEN- The patient is well appearing, alert and oriented x 3 today.   Head- normocephalic, atraumatic Eyes-  Sclera clear, conjunctiva pink Ears- hearing intact Oropharynx- clear Lungs- Clear to ausculation bilaterally, normal work of breathing Heart- Regular rate and rhythm, no murmurs, rubs or gallops, PMI not laterally  displaced GI- soft, NT, ND, + BS Extremities- no clubbing, cyanosis, or edema  ekg- declines ekg today (states he recently had one at the [place])  Assessment and Plan:

## 2012-03-02 NOTE — Patient Instructions (Signed)
Your physician recommends that you schedule a follow-up appointment as needed  

## 2012-04-28 ENCOUNTER — Emergency Department (HOSPITAL_COMMUNITY)
Admission: EM | Admit: 2012-04-28 | Discharge: 2012-04-28 | Disposition: A | Discharge status: Home / Self Care | Payer: Medicare Other | Attending: Cardiology | Admitting: Cardiology

## 2012-04-28 ENCOUNTER — Emergency Department (HOSPITAL_COMMUNITY): Payer: Medicare Other

## 2012-04-28 ENCOUNTER — Encounter (HOSPITAL_COMMUNITY): Payer: Self-pay | Admitting: Emergency Medicine

## 2012-04-28 ENCOUNTER — Telehealth: Payer: Self-pay | Admitting: Internal Medicine

## 2012-04-28 DIAGNOSIS — Z87442 Personal history of urinary calculi: Secondary | ICD-10-CM | POA: Insufficient documentation

## 2012-04-28 DIAGNOSIS — J159 Unspecified bacterial pneumonia: Secondary | ICD-10-CM | POA: Insufficient documentation

## 2012-04-28 DIAGNOSIS — I498 Other specified cardiac arrhythmias: Secondary | ICD-10-CM | POA: Insufficient documentation

## 2012-04-28 DIAGNOSIS — L408 Other psoriasis: Secondary | ICD-10-CM | POA: Insufficient documentation

## 2012-04-28 DIAGNOSIS — Z87891 Personal history of nicotine dependence: Secondary | ICD-10-CM | POA: Insufficient documentation

## 2012-04-28 DIAGNOSIS — I495 Sick sinus syndrome: Secondary | ICD-10-CM | POA: Insufficient documentation

## 2012-04-28 DIAGNOSIS — I2510 Atherosclerotic heart disease of native coronary artery without angina pectoris: Secondary | ICD-10-CM | POA: Insufficient documentation

## 2012-04-28 DIAGNOSIS — Z79899 Other long term (current) drug therapy: Secondary | ICD-10-CM | POA: Insufficient documentation

## 2012-04-28 DIAGNOSIS — E785 Hyperlipidemia, unspecified: Secondary | ICD-10-CM | POA: Insufficient documentation

## 2012-04-28 DIAGNOSIS — H9190 Unspecified hearing loss, unspecified ear: Secondary | ICD-10-CM | POA: Insufficient documentation

## 2012-04-28 DIAGNOSIS — I4891 Unspecified atrial fibrillation: Secondary | ICD-10-CM

## 2012-04-28 DIAGNOSIS — H3530 Unspecified macular degeneration: Secondary | ICD-10-CM | POA: Insufficient documentation

## 2012-04-28 DIAGNOSIS — I471 Supraventricular tachycardia: Secondary | ICD-10-CM

## 2012-04-28 DIAGNOSIS — K3189 Other diseases of stomach and duodenum: Secondary | ICD-10-CM | POA: Insufficient documentation

## 2012-04-28 LAB — BASIC METABOLIC PANEL
BUN: 17 mg/dL (ref 6–23)
Creatinine, Ser: 0.88 mg/dL (ref 0.50–1.35)
GFR calc Af Amer: >90 mL/min (ref >90)
GFR calc non Af Amer: 88 mL/min — ABNORMAL LOW (ref >90)
Glucose, Bld: 137 mg/dL — ABNORMAL HIGH (ref 70–99)

## 2012-04-28 LAB — CBC WITH DIFFERENTIAL/PLATELET
Basophils Relative: 1 % (ref 0–1)
Eosinophils Absolute: 0.4 10*3/uL (ref 0.0–0.7)
Eosinophils Relative: 6 % — ABNORMAL HIGH (ref 0–5)
HCT: 41.4 % (ref 39.0–52.0)
Hemoglobin: 14.4 g/dL (ref 13.0–17.0)
Lymphs Abs: 1.8 10*3/uL (ref 0.7–4.0)
MCH: 28.8 pg (ref 26.0–34.0)
MCHC: 34.8 g/dL (ref 30.0–36.0)
MCV: 82.8 fL (ref 78.0–100.0)
Monocytes Absolute: 0.6 10*3/uL (ref 0.1–1.0)
Monocytes Relative: 9 % (ref 3–12)
RBC: 5 MIL/uL (ref 4.22–5.81)

## 2012-04-28 MED ORDER — DILTIAZEM HCL ER COATED BEADS 120 MG PO CP24
120.0000 mg | ORAL_CAPSULE | Freq: Every day | ORAL | Status: DC
  Administered 2012-04-28: 120 mg via ORAL
  Filled 2012-04-28: qty 1

## 2012-04-28 MED ORDER — DILTIAZEM HCL 50 MG/10ML IV SOLN
10.0000 mg | Freq: Once | INTRAVENOUS | Status: AC
  Administered 2012-04-28: 10 mg via INTRAVENOUS

## 2012-04-28 MED ORDER — DILTIAZEM HCL ER COATED BEADS 120 MG PO CP24: 120.0000 mg | ORAL_CAPSULE | Freq: Every day | ORAL | Status: DC

## 2012-04-28 MED ORDER — DILTIAZEM HCL 100 MG IV SOLR
10.0000 mg/h | INTRAVENOUS | Status: DC
  Administered 2012-04-28: 10 mg/h via INTRAVENOUS
  Filled 2012-04-28: qty 100

## 2012-04-28 MED ORDER — METOPROLOL TARTRATE 25 MG PO TABS: 12.5000 mg | ORAL_TABLET | Freq: Two times a day (BID) | ORAL | Status: DC

## 2012-04-28 MED ORDER — METOPROLOL TARTRATE 1 MG/ML IV SOLN
5.0000 mg | Freq: Once | INTRAVENOUS | Status: DC
  Filled 2012-04-28: qty 5

## 2012-04-28 MED ORDER — RIVAROXABAN 20 MG PO TABS: 20.0000 mg | ORAL_TABLET | Freq: Every day | ORAL | Status: DC

## 2012-04-28 MED ORDER — RIVAROXABAN 20 MG PO TABS
20.0000 mg | ORAL_TABLET | Freq: Every day | ORAL | Status: DC
  Administered 2012-04-28: 20 mg via ORAL
  Filled 2012-04-28: qty 1

## 2012-04-28 MED ORDER — DILTIAZEM HCL 25 MG/5ML IV SOLN
10.0000 mg | Freq: Once | INTRAVENOUS | Status: AC
  Administered 2012-04-28: 10 mg via INTRAVENOUS
  Filled 2012-04-28: qty 5

## 2012-04-28 NOTE — Consult Note (Signed)
Patient ID: Kenneth [name] MRN: [id number], DOB/AGE: 1946/04/03   Admit date: 04/28/2012   Primary Physician: Marga Melnick, MD Primary Cardiologist: B. Crenshaw, MD/S. Graciela Husbands, MD  Pt. Profile:  67 y/o male with h/o PAF who presents with a 1 day h/o recurrent palpitations.  Problem List  Past Medical History  Diagnosis Date  . Paroxysmal atrial fibrillation     a. 11/2003 Tikosyn initiated - subsequently d/c'd;  b. 2006 RFCA for Afib @ [facility];  c. 09/2011 Echo: EF 60-65%, Gr 2 DD;  d. 10/2011 DCCV  . Psoriasis   . Dyspepsia   . Deafness   . Kidney stones   . Hyperlipidemia   . Macular degeneration     pt reports that he actually has a "retina problem' not MD  . Pneumonia 2009    in context of Flu  . Atypical chest pain     a. 06/2003 Cath:  nl cors;  b. 11/2006 MV: no ischemia, nl LV    Past Surgical History  Procedure Date  . Rotator cuff repair   . Atrial fibrillation ablation 2006    Dr Delena Serve at [facility]     Allergies  No Known Allergies  HPI  67 y/o male with the above problem list.  He is s/p RFCA of afib in 2006 and did well until the summer of 2013 when he had recurrent afib - identified by palpitations only - he is otherwise asymptomatic.  He underwent DCCV in 10/2011 and was placed on eliquis, dilt, and flecainide following.  When he saw Dr. Johney Frame back in August, he c/o dizziness on flecainide and this was d/c'd.  He was maintaining sinus rhythm and diltiazem and then eliquis were d/c'd in succession.  The option of repeat RFCA was discussed however pt wanted to see if he could maintain sinus first.  He did well until last night at approx 11 PM when he noted recurrent tachypalpitations.  He took a flecainide tablet w/o change in rhythm, but he was able to fall off to sleep.  When he awoke this AM, he noted persistent palpitations and took a second flecainide (100mg ).  Palpitations persisted.  He had a routine f/u appt with his PCP @ the [facility] and there, he was  noted to be tachycardic and ECG showed afib/flutter.  He was advised to present to [facility] ED but pt told his doctor that he would come back to [facility] and f/u with our office.  When he called our office he was told to go to the ED.  Here, he was placed on dilt gtt with reasonable rate control in the low 100's.  He is asymptomatic with the exception of knowing that his HR is up.  He wishes to go home and set up for outpt f/u.  Home Medications  Prior to Admission medications   Medication Sig Start Date End Date Taking? Authorizing Provider  aspirin EC 81 MG tablet Take 81 mg by mouth daily.   Yes Historical Provider, MD  cetirizine (ZYRTEC) 10 MG tablet Take 10 mg by mouth daily.     Yes Historical Provider, MD  folic acid (FOLVITE) 1 MG tablet Take 1 mg by mouth daily.   Yes Historical Provider, MD  Multiple Vitamin (MULTIVITAMIN) tablet Take 1 tablet by mouth daily.   Yes Historical Provider, MD  omeprazole (PRILOSEC) 40 MG capsule Take 40 mg by mouth daily.   Yes Historical Provider, MD  Pseudoeph-Doxylamine-DM-APAP (NYQUIL PO) Take 2 capsules by mouth  once.   Yes Historical Provider, MD  Pseudoephedrine-APAP-DM (DAYQUIL PO) Take 2 capsules by mouth once.   Yes Historical Provider, MD  etanercept (ENBREL) 50 MG/ML injection Inject 50 mg into the skin 2 (two) times a week.    Historical Provider, MD  fluticasone (FLONASE) 50 MCG/ACT nasal spray Place 1 spray into the nose daily as needed. For congestion    Historical Provider, MD  zolpidem (AMBIEN) 10 MG tablet Take 10 mg by mouth at bedtime as needed.    Historical Provider, MD   Family History  Family History  Problem Relation Age of Onset  . Coronary artery disease Mother   . Cancer Mother     BREAST,COLON  . Heart disease Maternal Grandmother     MI  . Stroke Paternal Grandfather     CVA  . Coronary artery disease Paternal Grandfather    Social History  History   Social History  . Marital Status: Married    Spouse Name: N/A     Number of Children: N/A  . Years of Education: N/A   Occupational History  . Not on file.   Social History Main Topics  . Smoking status: Former Smoker    Quit date: 09/29/2001  . Smokeless tobacco: Not on file  . Alcohol Use: No  . Drug Use: No  . Sexually Active: Not on file   Other Topics Concern  . Not on file   Social History Narrative   Pt lives in [place] with spouse.  Retired.  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    Review of Systems General:  No chills, fever, night sweats or weight changes.  Cardiovascular:  +++ palpitations.  No chest pain, dyspnea on exertion, edema, orthopnea, palpitations, paroxysmal nocturnal dyspnea. Dermatological: No rash, lesions/masses Respiratory: No cough, dyspnea Urologic: No hematuria, dysuria Abdominal:   No nausea, vomiting, diarrhea, bright red blood per rectum, melena, or hematemesis Neurologic:  No visual changes, wkns, changes in mental status. All other systems reviewed and are otherwise negative except as noted above.  Physical Exam  Blood pressure 137/79, pulse 119, temperature 97.8 F (36.6 C), resp. rate 24, SpO2 96.00%.  General: Pleasant, NAD Psych: Normal affect. Neuro: Alert and oriented X 3. Moves all extremities spontaneously. HEENT: Normal  Neck: Supple without bruits or JVD. Lungs:  Resp regular and unlabored, CTA. Heart: IR, IR, no s3, s4, or murmurs. Abdomen: Soft, non-tender, non-distended, BS + x 4.  Extremities: No clubbing, cyanosis or edema. DP/PT/Radials 2+ and equal bilaterally.  Labs   Basename 04/28/12 1208  CKTOTAL --  CKMB --  TROPONINI <0.30   Lab Results  Component Value Date   WBC 6.3 04/28/2012   HGB 14.4 04/28/2012   HCT 41.4 04/28/2012   MCV 82.8 04/28/2012   PLT 174 04/28/2012     Lab 04/28/12 1208  NA 139  K 4.0  CL 102  CO2 26  BUN 17  CREATININE 0.88  CALCIUM 9.6  PROT --  BILITOT --  ALKPHOS --  ALT --  AST --  GLUCOSE 137*   Radiology/Studies  Dg Chest  Port 1 View  04/28/2012  *RADIOLOGY REPORT*  Clinical Data: Tachycardia.  PORTABLE CHEST - 1 VIEW  IMPRESSION:  1.  Borderline cardiomegaly. 2.  Thoracic spondylosis.   Original Report Authenticated By: Gaylyn Rong, M.D.    ECG  Aflutter 145, st dep III.  ASSESSMENT AND PLAN  1.  PAF/Flutter:  Pt presents with recurrent afib/flutter.  Other than noting that his  rate his elevated, he is asymptomatic w/o chest pain or dyspnea.  He wishes to go home from the ED tonight.  His rate has been reasonably controlled on Dilt gtt @ 5mg /hr here.  He has diltiazem CD 120 mg at home along with eliquis 5mg .  After a lengthy discussion with the patient and his wife, we have advised to resume anticoagulation and oral diltiazem tonight.  I have reviewed Dr. Jenel Lucks last note and he would prefer pradaxa or xarelto if at some point ablation is to be undertaken.  Pt however still has eliquis at home and wishes to take it, however is willing to switch to xarelto.  Therfore, we will provide a dose of xarelto and dilt 120 now and d/c from ED.  We will arrange for early f/u in EP clinic this week so that we can move forward with rhythm mgmt.  Signed, Nicolasa Ducking, NP  04/28/2012, 7:08 PM  Attending Note:   The patient was seen and examined.  Agree with assessment and plan as noted above.  I had a long discussion with patient and wife.  He is upset because he has been here all day.    He has recurrent Atrial fibrillation - has had RF ablation many years ago.  He is currently stable on a low dose diltiazem drip . He definitely wants to go home.  We have called in script for xarelto 20 mg.  (Dr. Johney Frame prefers Xarelto instead of Eliquis prior to RF ablation).  We will start him on Diltiazem 120 mg CD ( which he has at home, and we refilled at [facility] on [address]) and Metoprolol 12.5 mg BID.    This should provide anticoagulation and adequate rate control.  He is to see Nehemiah Settle this week and she can arrange apt.  With EP.  Vesta Mixer, Montez Hageman., MD, [facility] 04/28/2012, 7:56 PM

## 2012-04-28 NOTE — ED Notes (Signed)
Pt denies pain. Pt denies dizziness/lightheadedness. Pt denies nausea. Pt mentating appropriately.

## 2012-04-28 NOTE — ED Notes (Signed)
Pt denies pain. Pt denies nausea. Pt denies lightheadedness/dizziness. Pt alert and mentating appropriately.

## 2012-04-28 NOTE — ED Notes (Signed)
[place] at bedside

## 2012-04-28 NOTE — ED Notes (Signed)
Spoke with pharmacy and verified medications would be sent to [facility] once verified

## 2012-04-28 NOTE — ED Notes (Signed)
Dr Cook at bedside

## 2012-04-28 NOTE — ED Notes (Signed)
Dr. Adriana Simas verified pt can eat.

## 2012-04-28 NOTE — ED Notes (Signed)
Laneta Simmers, RN at bedside discussing care with pt

## 2012-04-28 NOTE — ED Notes (Signed)
X-ray at bedside

## 2012-04-28 NOTE — ED Notes (Signed)
Pt states "I feel like I could go to work." Pt alert and mentating appropriately.

## 2012-04-28 NOTE — ED Notes (Signed)
Verified d/c with Ward Givens, PA from [facility].

## 2012-04-28 NOTE — ED Notes (Signed)
Notified Dr Adriana Simas of pts heart rate. Dr. Adriana Simas verified that diltiazem should remain at 10mg /h.

## 2012-04-28 NOTE — ED Notes (Addendum)
Verified discharge with Ward Givens, PA at [facility]. Pt given d/c instructions that Adolph Pollack complied for pt. Pt educated on follow up care with cardiology and verbalizes understanding of d/c instructions. Pt has no further questions upon d/c. Pt does not appear to be in acute distress upon d/c.

## 2012-04-28 NOTE — ED Provider Notes (Signed)
History     CSN: [id number]  Arrival date & time 04/28/12  1047   First MD Initiated Contact with Patient 04/28/12 1131      Chief Complaint  Patient presents with  . Tachycardia    (Consider location/radiation/quality/duration/timing/severity/associated sxs/prior treatment) HPI... level V caveat for urgent need for intervention.   Patient has a sense of rapid heart rate since about midnight last night. Originally he was seen by his [place] doctor in [place]. He then came to in the [facility] [place].  history of atrial fibrillation resulting in an ablation in 2006 by a physician in [place]. He takes Flecainide when necessary for episodic atrial fib. No chest pain, shortness of breath, nausea, vomiting, diaphoresis. Nothing makes symptoms better or worse   Past Medical History  Diagnosis Date  . Paroxysmal atrial fibrillation   . Psoriasis   . Dyspepsia   . Deafness   . Kidney stones   . Hyperlipidemia   . Macular degeneration     pt reports that he actually has a "retina problem' not MD  . Pneumonia 2009    in context of Flu  . Coronary artery disease     Past Surgical History  Procedure Date  . Rotator cuff repair   . Atrial fibrillation ablation 2006    Dr Delena Serve at [facility] History  Problem Relation Age of Onset  . Coronary artery disease Mother   . Cancer Mother     BREAST,COLON  . Heart disease Maternal Grandmother     MI  . Stroke Paternal Grandfather     CVA  . Coronary artery disease Paternal Grandfather     History  Substance Use Topics  . Smoking status: Former Smoker    Quit date: 09/29/2001  . Smokeless tobacco: Not on file  . Alcohol Use: No      Review of Systems  All other systems reviewed and are negative.    Allergies  Review of patient's allergies indicates no known allergies.  Home Medications   Current Outpatient Rx  Name  Route  Sig  Dispense  Refill  . ASPIRIN EC 81 MG PO TBEC   Oral   Take 81 mg by  mouth daily.         Marland Kitchen CETIRIZINE HCL 10 MG PO TABS   Oral   Take 10 mg by mouth daily.           Marland Kitchen FOLIC ACID 1 MG PO TABS   Oral   Take 1 mg by mouth daily.         Marland Kitchen ONE-DAILY MULTI VITAMINS PO TABS   Oral   Take 1 tablet by mouth daily.         Marland Kitchen OMEPRAZOLE 40 MG PO CPDR   Oral   Take 40 mg by mouth daily.         . NYQUIL PO   Oral   Take 2 capsules by mouth once.         . DAYQUIL PO   Oral   Take 2 capsules by mouth once.         Marland Kitchen ETANERCEPT 50 MG/ML [place] SOLN   Subcutaneous   Inject 50 mg into the skin 2 (two) times a week.         Marland Kitchen FLUTICASONE PROPIONATE 50 MCG/ACT NA SUSP   Nasal   Place 1 spray into the nose daily as needed. For congestion         .  ZOLPIDEM TARTRATE 10 MG PO TABS   Oral   Take 10 mg by mouth at bedtime as needed.           BP 130/70  Pulse 85  Temp 97.8 F (36.6 C)  Resp 15  SpO2 96%  Physical Exam  Nursing note and vitals reviewed. Constitutional: He is oriented to person, place, and time. He appears well-developed and well-nourished.  HENT:  Head: Normocephalic and atraumatic.  Eyes: Conjunctivae normal and EOM are normal. Pupils are equal, round, and reactive to light.  Neck: Normal range of motion. Neck supple.  Cardiovascular: Regular rhythm and normal heart sounds.        Tachycardic  Pulmonary/Chest: Effort normal and breath sounds normal.  Abdominal: Soft. Bowel sounds are normal.  Musculoskeletal: Normal range of motion.  Neurological: He is alert and oriented to person, place, and time.  Skin: Skin is warm and dry.  Psychiatric: He has a normal mood and affect.    ED Course  Procedures (including critical care time)  Labs Reviewed  CBC WITH DIFFERENTIAL - Abnormal; Notable for the following:    Eosinophils Relative 6 (*)     All other components within normal limits  BASIC METABOLIC PANEL - Abnormal; Notable for the following:    Glucose, Bld 137 (*)     GFR calc non Af Amer 88 (*)      All other components within normal limits  TROPONIN I   Dg Chest Port 1 View  04/28/2012  *RADIOLOGY REPORT*  Clinical Data: Tachycardia.  PORTABLE CHEST - 1 VIEW  Comparison: 10/22/2011  Findings: The patient is rotated to the right on today's exam, resulting in reduced diagnostic sensitivity and specificity. Thoracic spondylosis noted.  Borderline cardiomegaly noted.  The lungs appear clear.  No pleural effusion.  IMPRESSION:  1.  Borderline cardiomegaly. 2.  Thoracic spondylosis.   Original Report Authenticated By: Gaylyn Rong, M.D.     Date: 04/28/2012  Rate: 150  Rhythm: supraventricular tachycardia (SVT)  QRS Axis: normal  Intervals: normal  ST/T Wave abnormalities: normal  Conduction Disutrbances:none  Narrative Interpretation:   Old EKG Reviewed: changes noted   No diagnosis found.  CRITICAL CARE Performed by: Donnetta Hutching  ?  Total critical care time: 30  Critical care time was exclusive of separately billable procedures and treating other patients.  Critical care was necessary to treat or prevent imminent or life-threatening deterioration.  Critical care was time spent personally by me on the following activities: development of treatment plan with patient and/or surrogate as well as nursing, discussions with consultants, evaluation of patient's response to treatment, examination of patient, obtaining history from patient or surrogate, ordering and performing treatments and interventions, ordering and review of laboratory studies, ordering and review of radiographic studies, pulse oximetry and re-evaluation of patient's condition.  MDM  Patient still in SVT vs a fib despite Cardizem. Will add IV beta blocker. Consult cardiology. Admit.        Donnetta Hutching, MD 04/28/12 [phone]

## 2012-04-28 NOTE — ED Notes (Signed)
[facility] cardiology for d/c paperwork for pt

## 2012-04-28 NOTE — ED Notes (Signed)
Dr. Adriana Simas notified about pts status

## 2012-04-28 NOTE — ED Notes (Signed)
States felt his heart rate up last night [facility] so he went  And was  Found to have A Fib has hx of same took a flecanide x 2

## 2012-04-28 NOTE — Telephone Encounter (Signed)
Pt heart rate is very fast and he is in a-fib the [place] was did a EKG and told him to go to ED asap ans he is on the way to [facility] ED from [place] now

## 2012-04-28 NOTE — ED Notes (Signed)
Cardiology at bedside.

## 2012-04-28 NOTE — ED Notes (Signed)
Pt eating at bedside with wife. Pt denies pain. Pt denies nausea. Pt mentating appropriately. Pt does not appear to be in distress.

## 2012-04-28 NOTE — ED Notes (Signed)
Pt denies pain currently. Pt denies shortness of breath. Pt denies nausea/vomiting. Pt denies dizziness/lightheadedness. Pt mentating appropriately

## 2012-04-28 NOTE — ED Notes (Signed)
Spoke with [facility] and states they will be around to see pt in about 20-30 minutes

## 2012-04-28 NOTE — ED Notes (Signed)
Pt back in a.fib. Pt mentating appropriately. Pt denies pain.

## 2012-04-29 ENCOUNTER — Telehealth: Payer: Self-pay | Admitting: Internal Medicine

## 2012-04-29 NOTE — Telephone Encounter (Signed)
I spoke with the patient's wife. She states that the patient take his cardizem 120 mg and metoprolol 25 mg (he mistakenly took a whole tablet instead of a half) at 11:40 am. His BP/ HR at 12:00 pm was 122/73 & 137 bpm. At 1227 he was 116/69 & 135 bpm. I explained to the patient's wife that I had spoken with Dr. Jens Som. I advised if his rates stay elevated or he becomes symptomatic, he should report to the ER. Otherwise, if his rates start to slow, even if he doesn't convert, he is ok to keep his appointment with Dr. Graciela Husbands tomorrow afternoon. They are aware to d/c flecainide. I have advised that they recheck his bp/ hr in about 2 hours to give the medication more time. They will call back with further concerns.

## 2012-04-29 NOTE — Telephone Encounter (Signed)
(   initially spoke with the patient around 9:30 am) I spoke with the patient and his wife. The patient was in the ER last night for irregular heart beat. He reports flucuating from SVT to a-fib. He was instructed to follow up with Dr. Graciela Husbands this week. He is scheduled to see Dr. Graciela Husbands tomorrow at 4:45 pm. The patient is concerned this morning as his heart rate is around 149 bpm and irregular and his bp is 115/68. He has not taken any meds this morning as he was not quite sure what order to take these in. The patient's wife states he was started on diltiazem 120 mg daily, metoprolol tartate 12.5 mg BID, and flecainide. She states they were told not to take flecainide and diltiazem at the same time, but to wait 2 hours. I did not see flecainide in the patient's chart, but per the patient's wife, they have 100 mg at home. I initially instructed them to go ahead and take flecainide, then take diltiazem. The patient does not have his lopressor yet. They will call me back around 12 pm with the patient's heart rate. Sherri Rad, RN, BSN  I reviewed the patient's situation with Dr. Jens Som. He recommends that the patient d/c flecainide and stay on diltiazem and metoprolol. If he calls back and his rates are still elevated or he is symptomatic, he should report back to the ER. If his rates are slowing down and he feels ok, he is ok to wait until tomorrow to follow up with Dr. Larey Brick, RN, BSN

## 2012-04-29 NOTE — Telephone Encounter (Signed)
New problem:   Seen in er on yesterday at [facility]. Was advise to call the office this am.  appt is made for tomorrow  1/23 @ 4:45 pm.    C/O blood pressure  115/79 take this am. Heart rate 146. Heart is out of rhythm.

## 2012-04-29 NOTE — Telephone Encounter (Signed)
Pt rtn call to heather, said was to call at noon, pls call on cell at [phone]

## 2012-04-30 ENCOUNTER — Encounter: Payer: Self-pay | Admitting: Internal Medicine

## 2012-04-30 ENCOUNTER — Other Ambulatory Visit: Payer: Self-pay | Admitting: Internal Medicine

## 2012-04-30 ENCOUNTER — Ambulatory Visit (INDEPENDENT_AMBULATORY_CARE_PROVIDER_SITE_OTHER): Payer: Medicare Other | Admitting: Internal Medicine

## 2012-04-30 ENCOUNTER — Encounter: Payer: Self-pay | Admitting: *Deleted

## 2012-04-30 VITALS — BP 133/55 | HR 155 | Ht 75.0 in | Wt 256.6 lb

## 2012-04-30 DIAGNOSIS — I509 Heart failure, unspecified: Secondary | ICD-10-CM

## 2012-04-30 DIAGNOSIS — I5031 Acute diastolic (congestive) heart failure: Secondary | ICD-10-CM | POA: Insufficient documentation

## 2012-04-30 DIAGNOSIS — I4891 Unspecified atrial fibrillation: Secondary | ICD-10-CM

## 2012-04-30 NOTE — Progress Notes (Signed)
kf Patient Care Team: William F Hopper, MD as PCP - General   HPI  Kenneth [name] is a 67 y.o. male Seen in followup for atrial fibrillation. He is status post ablation with Dr. Wharton 2006. A single episode of recurrent atrial fibrillation was associated he thinks with herbal tea. He underwent cardioversion at that time. He took flecainide for a while but no longer; was stopped secondary to dizziness He has also stopped anticoagulants. CHADS-VASc score is 1 He was seen in the emergency room earlier this month with atrial fibrillation.  He was discharged from the emergency room on Rivaroxaban and diltiazem for rate control. He was asymptomatic  he comes in today there is some shortness of breath. His heart rate is quite high. There has been no edema. .  Past Medical History  Diagnosis Date  . Paroxysmal atrial fibrillation     a. 11/2003 Tikosyn initiated - subsequently d/c'd;  b. 2006 RFCA for Afib @ [facility];  c. 09/2011 Echo: EF 60-65%, Gr 2 DD;  d. 10/2011 DCCV  . Psoriasis   . Dyspepsia   . Deafness   . Kidney stones   . Hyperlipidemia   . Macular degeneration     pt reports that he actually has a "retina problem' not MD  . Pneumonia 2009    in context of Flu  . Atypical chest pain     a. 06/2003 Cath:  nl cors;  b. 11/2006 MV: no ischemia, nl LV    Past Surgical History  Procedure Date  . Rotator cuff repair   . Atrial fibrillation ablation 2006    Dr Wharton at [facility]    Current Outpatient Prescriptions  Medication Sig Dispense Refill  . cetirizine (ZYRTEC) 10 MG tablet Take 10 mg by mouth daily.        . diltiazem (CARDIZEM CD) 120 MG 24 hr capsule Take 1 capsule (120 mg total) by mouth daily.  30 capsule  6  . etanercept (ENBREL) 50 MG/ML injection Inject 50 mg into the skin 2 (two) times a week.      . fluticasone (FLONASE) 50 MCG/ACT nasal spray Place 1 spray into the nose daily as needed. For congestion      . folic acid (FOLVITE) 1 MG tablet Take 1 mg by mouth daily.        . metoprolol tartrate (LOPRESSOR) 25 MG tablet Take 0.5 tablets (12.5 mg total) by mouth 2 (two) times daily.  30 tablet  3  . Multiple Vitamin (MULTIVITAMIN) tablet Take 1 tablet by mouth daily.      . omeprazole (PRILOSEC) 40 MG capsule Take 40 mg by mouth daily.      . Rivaroxaban (XARELTO) 20 MG TABS Take 1 tablet (20 mg total) by mouth daily.  30 tablet  6  . zolpidem (AMBIEN) 10 MG tablet Take 10 mg by mouth at bedtime as needed.        No Known Allergies  Review of Systems negative except from HPI and PMH  Physical Exam BP 133/55  Pulse 155  Ht 6' 3" (1.905 m)  Wt 256 lb 9.6 oz (116.393 kg)  BMI 32.07 kg/m2 Well developed and well nourished in no acute distress HENT normal E scleral and icterus clear Neck Supple JVP a-9 tids brisk and full Clear to ausculation Irregularly irregular rate and rhythm, no murmurs gallops or rub Soft with active bowel sounds No clubbing cyanosis  trace Edema Alert and oriented, grossly normal motor and sensory function Skin   Warm and Dry   his demonstrates atrial fibrillation at 152   Assessment and  Plan  

## 2012-04-30 NOTE — Assessment & Plan Note (Signed)
The patient has recurrent atrial fibrillation that i and next Lopressor.s we will arrange quite symptomatic. We'll increase his rate control overnight by having him take an extra Cardizem We will arrange cardioversion tomorrow as he has been on anticoagulants since shortly after the tachycardia began. We will then refer him back to Dr. Johney Frame for consideration of pulmonary vein isolation repeat procedure.

## 2012-04-30 NOTE — Assessment & Plan Note (Signed)
We will try to get rate down for right now.

## 2012-05-01 ENCOUNTER — Ambulatory Visit (HOSPITAL_COMMUNITY)
Admission: RE | Admit: 2012-05-01 | Discharge: 2012-05-01 | Disposition: A | Discharge status: Home / Self Care | Payer: Medicare Other | Source: Ambulatory Visit | Attending: Internal Medicine | Admitting: Internal Medicine

## 2012-05-01 ENCOUNTER — Encounter (HOSPITAL_COMMUNITY)
Admission: RE | Disposition: A | Discharge status: Home / Self Care | Payer: Self-pay | Source: Ambulatory Visit | Attending: Internal Medicine

## 2012-05-01 DIAGNOSIS — I4891 Unspecified atrial fibrillation: Secondary | ICD-10-CM | POA: Insufficient documentation

## 2012-05-01 HISTORY — PX: CARDIOVERSION: SHX1299

## 2012-05-01 SURGERY — CARDIOVERSION
Anesthesia: LOCAL | Wound class: Clean

## 2012-05-01 MED ORDER — MIDAZOLAM HCL 2 MG/2ML IJ SOLN
INTRAMUSCULAR | Status: AC
  Filled 2012-05-01: qty 4

## 2012-05-01 MED ORDER — SODIUM CHLORIDE 0.9 % IV SOLN
INTRAVENOUS | Status: DC
  Administered 2012-05-01: 09:00:00 via INTRAVENOUS

## 2012-05-01 MED ORDER — FENTANYL CITRATE 0.05 MG/ML IJ SOLN
INTRAMUSCULAR | Status: AC
  Filled 2012-05-01: qty 2

## 2012-05-01 NOTE — Interval H&P Note (Signed)
History and Physical Interval Note:  05/01/2012 8:59 AM  Kenneth [name]  has presented today for surgery, with the diagnosis of Afib  The various methods of treatment have been discussed with the patient and family. After consideration of risks, benefits and other options for treatment, the patient has consented to  Procedure(s) (LRB) with comments: CARDIOVERSION (N/A) as a surgical intervention .  The patient's history has been reviewed, patient examined, no change in status, stable for surgery.  I have reviewed the patient's chart and labs.  Questions were answered to the patient's satisfaction.     Sherryl Manges

## 2012-05-01 NOTE — H&P (View-Only) (Signed)
kf Patient Care Team: Pecola Lawless, MD as PCP - General   HPI  Kenneth [name] is a 67 y.o. male Seen in followup for atrial fibrillation. He is status post ablation with Dr. Delena Serve 2006. A single episode of recurrent atrial fibrillation was associated he thinks with herbal tea. He underwent cardioversion at that time. He took flecainide for a while but no longer; was stopped secondary to dizziness He has also stopped anticoagulants. CHADS-VASc score is 1 He was seen in the emergency room earlier this month with atrial fibrillation.  He was discharged from the emergency room on Rivaroxaban and diltiazem for rate control. He was asymptomatic  he comes in today there is some shortness of breath. His heart rate is quite high. There has been no edema. .  Past Medical History  Diagnosis Date  . Paroxysmal atrial fibrillation     a. 11/2003 Tikosyn initiated - subsequently d/c'd;  b. 2006 RFCA for Afib @ [facility];  c. 09/2011 Echo: EF 60-65%, Gr 2 DD;  d. 10/2011 DCCV  . Psoriasis   . Dyspepsia   . Deafness   . Kidney stones   . Hyperlipidemia   . Macular degeneration     pt reports that he actually has a "retina problem' not MD  . Pneumonia 2009    in context of Flu  . Atypical chest pain     a. 06/2003 Cath:  nl cors;  b. 11/2006 MV: no ischemia, nl LV    Past Surgical History  Procedure Date  . Rotator cuff repair   . Atrial fibrillation ablation 2006    Dr Delena Serve at [facility]    Current Outpatient Prescriptions  Medication Sig Dispense Refill  . cetirizine (ZYRTEC) 10 MG tablet Take 10 mg by mouth daily.        Marland Kitchen diltiazem (CARDIZEM CD) 120 MG 24 hr capsule Take 1 capsule (120 mg total) by mouth daily.  30 capsule  6  . etanercept (ENBREL) 50 MG/ML injection Inject 50 mg into the skin 2 (two) times a week.      . fluticasone (FLONASE) 50 MCG/ACT nasal spray Place 1 spray into the nose daily as needed. For congestion      . folic acid (FOLVITE) 1 MG tablet Take 1 mg by mouth daily.        . metoprolol tartrate (LOPRESSOR) 25 MG tablet Take 0.5 tablets (12.5 mg total) by mouth 2 (two) times daily.  30 tablet  3  . Multiple Vitamin (MULTIVITAMIN) tablet Take 1 tablet by mouth daily.      Marland Kitchen omeprazole (PRILOSEC) 40 MG capsule Take 40 mg by mouth daily.      . Rivaroxaban (XARELTO) 20 MG TABS Take 1 tablet (20 mg total) by mouth daily.  30 tablet  6  . zolpidem (AMBIEN) 10 MG tablet Take 10 mg by mouth at bedtime as needed.        No Known Allergies  Review of Systems negative except from HPI and PMH  Physical Exam BP 133/55  Pulse 155  Ht 6\' 3"  (1.905 m)  Wt 256 lb 9.6 oz (116.393 kg)  BMI 32.07 kg/m2 Well developed and well nourished in no acute distress HENT normal E scleral and icterus clear Neck Supple JVP a-9 tids brisk and full Clear to ausculation Irregularly irregular rate and rhythm, no murmurs gallops or rub Soft with active bowel sounds No clubbing cyanosis  trace Edema Alert and oriented, grossly normal motor and sensory function Skin  Warm and Dry   his demonstrates atrial fibrillation at 152   Assessment and  Plan

## 2012-05-01 NOTE — CV Procedure (Signed)
Preop Dx afib Post op DX  NSR   Procedure  DC Cardioversion   Pt was sedated by anesthesia receiving 3 mg versed 50 mg fentalnyl   A synchronized shock 100 joules restored sinus Rhythm   Pt tolerated without difficulty

## 2012-05-20 ENCOUNTER — Telehealth: Payer: Self-pay | Admitting: Internal Medicine

## 2012-05-20 NOTE — Telephone Encounter (Signed)
New Problem    Pt medication is making him very tired, no energy to do anything, but is control his heart rate. Pt would like to know if an alternative is available. Would like to speak to nurse about seeing Dr. Johney Frame sooner.

## 2012-05-21 ENCOUNTER — Encounter: Payer: Medicare Other | Admitting: Internal Medicine

## 2012-06-10 ENCOUNTER — Encounter: Payer: Self-pay | Admitting: Internal Medicine

## 2012-06-10 ENCOUNTER — Ambulatory Visit (INDEPENDENT_AMBULATORY_CARE_PROVIDER_SITE_OTHER): Payer: Medicare Other | Admitting: Internal Medicine

## 2012-06-10 VITALS — BP 119/82 | HR 83 | Ht 75.0 in | Wt 259.0 lb

## 2012-06-10 NOTE — Patient Instructions (Addendum)
Your physician has recommended that you have an ablation. Catheter ablation is a medical procedure used to treat some cardiac arrhythmias (irregular heartbeats). During catheter ablation, a long, thin, flexible tube is put into a blood vessel in your groin (upper thigh), or neck. This tube is called an ablation catheter. It is then guided to your heart through the blood vessel. Radio frequency waves destroy small areas of heart tissue where abnormal heartbeats may cause an arrhythmia to start. Please see the instruction sheet given to you today.    Patient has many scheduling conflicts and is going to call me and let me know what dates work best for him  Patient stopped [employer]

## 2012-06-11 NOTE — Assessment & Plan Note (Signed)
The patient has symptomatic persistent atrial fibrillation.  He is appropriately anticoagulated with xarelto.  He is s/p ablation by Dr Delena Serve in 2006 and has had an excellent response.  As his afib continues to recur, he is now more willing to consider ablation.  Therapeutic strategies for afib including medicine and ablation were discussed in detail with the patient today. Risk, benefits, and alternatives to EP study and radiofrequency ablation for afib were also discussed in detail today. These risks include but are not limited to stroke, bleeding, vascular damage, tamponade, perforation, damage to the esophagus, lungs, and other structures, pulmonary vein stenosis, worsening renal function, and death. The patient understands these risk and wishes to proceed.  Due to travels and conflicts in his schedule, he thinks that it may be late April or early May before he is ready to proceed. Given prior ablation, we will order a cardiac CT prior to his ablation to assess his pulmonary vein anatomy.

## 2012-06-11 NOTE — Progress Notes (Signed)
PCP: Marga Melnick, MD Primary Cardiologist:  Jens Som Primary EP:  Leandro Berkowitz is a 67 y.o. male who presents today for routine electrophysiology followup.  Since last being seen in our clinic, the patient has developed recurrent afib.  He was recently cardioverted by Dr Graciela Husbands.  He has been able to maintain sinus rhythm since that time.  Today, he denies symptoms of palpitations, chest pain, shortness of breath,  lower extremity edema, dizziness, presyncope, or syncope.  The patient is otherwise without complaint today.   Past Medical History  Diagnosis Date  . Paroxysmal atrial fibrillation     a. 11/2003 Tikosyn initiated - subsequently d/c'd;  b. 2006 RFCA for Afib @ [facility];  c. 09/2011 Echo: EF 60-65%, Gr 2 DD;  d. 10/2011 DCCV  . Psoriasis   . Dyspepsia   . Deafness   . Kidney stones   . Hyperlipidemia   . Macular degeneration     pt reports that he actually has a "retina problem' not MD  . Pneumonia 2009    in context of Flu  . Atypical chest pain     a. 06/2003 Cath:  nl cors;  b. 11/2006 MV: no ischemia, nl LV   Past Surgical History  Procedure Laterality Date  . Rotator cuff repair    . Atrial fibrillation ablation  2006    Dr Delena Serve at [facility]    Current Outpatient Prescriptions  Medication Sig Dispense Refill  . cetirizine (ZYRTEC) 10 MG tablet Take 10 mg by mouth daily.        Marland Kitchen diltiazem (CARDIZEM CD) 120 MG 24 hr capsule Take 1 capsule (120 mg total) by mouth daily.  30 capsule  6  . ferrous sulfate 325 (65 FE) MG tablet Take 325 mg by mouth daily with breakfast.      . folic acid (FOLVITE) 1 MG tablet Take 1 mg by mouth daily.      . methotrexate (RHEUMATREX) 2.5 MG tablet       . metoprolol tartrate (LOPRESSOR) 25 MG tablet Take 0.5 tablets (12.5 mg total) by mouth 2 (two) times daily.  30 tablet  3  . Multiple Vitamin (MULTIVITAMIN) tablet Take 1 tablet by mouth daily.      Marland Kitchen omeprazole (PRILOSEC) 40 MG capsule Take 40 mg by mouth daily.      . Rivaroxaban  (XARELTO) 20 MG TABS Take 1 tablet (20 mg total) by mouth daily.  30 tablet  6  . zolpidem (AMBIEN) 10 MG tablet Take 10 mg by mouth at bedtime as needed.       No current facility-administered medications for this visit.    Physical Exam: Filed Vitals:   06/10/12 1526  BP: 119/82  Pulse: 83  Height: 6\' 3"  (1.905 m)  Weight: 259 lb (117.482 kg)    GEN- The patient is well appearing, alert and oriented x 3 today.   Head- normocephalic, atraumatic Eyes-  Sclera clear, conjunctiva pink Ears- hearing intact Oropharynx- clear Lungs- Clear to ausculation bilaterally, normal work of breathing Heart- Regular rate and rhythm, no murmurs, rubs or gallops, PMI not laterally displaced GI- soft, NT, ND, + BS Extremities- no clubbing, cyanosis, or edema  ekg today reveals sinus rhythm with PACs, nonspecific ST/T changes  Assessment and Plan:

## 2012-07-01 ENCOUNTER — Encounter: Payer: Self-pay | Admitting: Gastroenterology

## 2012-08-04 ENCOUNTER — Encounter: Payer: Self-pay | Admitting: *Deleted

## 2012-08-04 NOTE — Telephone Encounter (Signed)
This encounter was created in error - please disregard.

## 2013-03-25 ENCOUNTER — Encounter (HOSPITAL_COMMUNITY): Admission: EM | Disposition: A | Discharge status: Home / Self Care | Payer: Self-pay | Source: Home / Self Care

## 2013-03-25 ENCOUNTER — Emergency Department (HOSPITAL_COMMUNITY): Payer: Medicare Other | Admitting: Certified Registered Nurse Anesthetist

## 2013-03-25 ENCOUNTER — Emergency Department (HOSPITAL_COMMUNITY)
Admission: EM | Admit: 2013-03-25 | Discharge: 2013-03-25 | Disposition: A | Discharge status: Home / Self Care | Payer: Medicare Other | Attending: Emergency Medicine | Admitting: Emergency Medicine

## 2013-03-25 ENCOUNTER — Encounter (HOSPITAL_COMMUNITY): Payer: Medicare Other | Admitting: Certified Registered Nurse Anesthetist

## 2013-03-25 ENCOUNTER — Encounter (HOSPITAL_COMMUNITY): Payer: Self-pay | Admitting: Emergency Medicine

## 2013-03-25 DIAGNOSIS — I4891 Unspecified atrial fibrillation: Secondary | ICD-10-CM

## 2013-03-25 DIAGNOSIS — Z872 Personal history of diseases of the skin and subcutaneous tissue: Secondary | ICD-10-CM | POA: Insufficient documentation

## 2013-03-25 DIAGNOSIS — I5031 Acute diastolic (congestive) heart failure: Secondary | ICD-10-CM

## 2013-03-25 DIAGNOSIS — Z8701 Personal history of pneumonia (recurrent): Secondary | ICD-10-CM | POA: Insufficient documentation

## 2013-03-25 DIAGNOSIS — R0789 Other chest pain: Secondary | ICD-10-CM | POA: Insufficient documentation

## 2013-03-25 DIAGNOSIS — Z8669 Personal history of other diseases of the nervous system and sense organs: Secondary | ICD-10-CM | POA: Insufficient documentation

## 2013-03-25 DIAGNOSIS — Z87891 Personal history of nicotine dependence: Secondary | ICD-10-CM | POA: Insufficient documentation

## 2013-03-25 DIAGNOSIS — Z862 Personal history of diseases of the blood and blood-forming organs and certain disorders involving the immune mechanism: Secondary | ICD-10-CM | POA: Insufficient documentation

## 2013-03-25 DIAGNOSIS — R072 Precordial pain: Secondary | ICD-10-CM | POA: Diagnosis present

## 2013-03-25 DIAGNOSIS — R002 Palpitations: Secondary | ICD-10-CM | POA: Insufficient documentation

## 2013-03-25 DIAGNOSIS — Z79899 Other long term (current) drug therapy: Secondary | ICD-10-CM | POA: Insufficient documentation

## 2013-03-25 DIAGNOSIS — Z8719 Personal history of other diseases of the digestive system: Secondary | ICD-10-CM | POA: Insufficient documentation

## 2013-03-25 DIAGNOSIS — Z7901 Long term (current) use of anticoagulants: Secondary | ICD-10-CM

## 2013-03-25 DIAGNOSIS — I4892 Unspecified atrial flutter: Secondary | ICD-10-CM | POA: Insufficient documentation

## 2013-03-25 DIAGNOSIS — Z87442 Personal history of urinary calculi: Secondary | ICD-10-CM | POA: Insufficient documentation

## 2013-03-25 DIAGNOSIS — Z8639 Personal history of other endocrine, nutritional and metabolic disease: Secondary | ICD-10-CM | POA: Insufficient documentation

## 2013-03-25 LAB — CBC WITH DIFFERENTIAL/PLATELET
Hemoglobin: 15.8 g/dL (ref 13.0–17.0)
Lymphocytes Relative: 23 % (ref 12–46)
Lymphs Abs: 1.4 10*3/uL (ref 0.7–4.0)
MCHC: 35.3 g/dL (ref 30.0–36.0)
Monocytes Relative: 7 % (ref 3–12)
Neutro Abs: 4.3 10*3/uL (ref 1.7–7.7)
Neutrophils Relative %: 69 % (ref 43–77)
Platelets: 198 10*3/uL (ref 150–400)
RBC: 5.05 MIL/uL (ref 4.22–5.81)
WBC: 6.3 10*3/uL (ref 4.0–10.5)

## 2013-03-25 LAB — BASIC METABOLIC PANEL
BUN: 12 mg/dL (ref 6–23)
Calcium: 9.4 mg/dL (ref 8.4–10.5)
Creatinine, Ser: 0.86 mg/dL (ref 0.50–1.35)
GFR calc Af Amer: >90 mL/min (ref >90)
GFR calc non Af Amer: 88 mL/min — ABNORMAL LOW (ref >90)

## 2013-03-25 LAB — POCT I-STAT TROPONIN I: Troponin i, poc: 0 ng/mL (ref 0.00–0.08)

## 2013-03-25 SURGERY — CARDIOVERSION
Anesthesia: Monitor Anesthesia Care

## 2013-03-25 MED ORDER — DILTIAZEM HCL 25 MG/5ML IV SOLN
10.0000 mg | Freq: Once | INTRAVENOUS | Status: AC
  Administered 2013-03-25: 10 mg via INTRAVENOUS
  Filled 2013-03-25: qty 5

## 2013-03-25 MED ORDER — PROPOFOL 10 MG/ML IV BOLUS
INTRAVENOUS | Status: AC | PRN
  Administered 2013-03-25: 90 mg via INTRAVENOUS

## 2013-03-25 MED ORDER — FLECAINIDE ACETATE 100 MG PO TABS
300.0000 mg | ORAL_TABLET | Freq: Once | ORAL | Status: AC
  Administered 2013-03-25: 300 mg via ORAL
  Filled 2013-03-25: qty 3

## 2013-03-25 MED ORDER — DILTIAZEM HCL 100 MG IV SOLR
5.0000 mg/h | INTRAVENOUS | Status: DC
  Administered 2013-03-25 (×2): 5 mg/h via INTRAVENOUS
  Administered 2013-03-25: 10 mg/h via INTRAVENOUS
  Filled 2013-03-25: qty 100

## 2013-03-25 MED ORDER — FLECAINIDE ACETATE 100 MG PO TABS: 300.0000 mg | ORAL_TABLET | Freq: Once | ORAL | Status: DC | PRN

## 2013-03-25 MED ORDER — LIDOCAINE HCL (CARDIAC) 20 MG/ML IV SOLN
INTRAVENOUS | Status: AC | PRN
  Administered 2013-03-25: 60 mg via INTRAVENOUS

## 2013-03-25 NOTE — Consult Note (Signed)
    CARDIOLOGY CONSULT NOTE   Patient ID: Kenneth [name] MRN: [id number], DOB/AGE: 04/16/1945 67 y.o. Date of Encounter: 03/25/2013  Primary Physician: William Hopper, MD Primary Cardiologist: SK/JA  Chief Complaint:  Palpitations  HPI: Kenneth [name] is a 67 y.o. male with a history of atrial fibrillation, on Xarelto. He was ablated in 2006 but has had persistent, intermittent, symptomatic atrial fibrillation. He was seen in March 2014 and ablation was recommended. However, he was maintaining SR and did not want to have any other procedures. He has done well since then, until yesterday.  Yesterday, he had sudden onset of SSCP at about 3 pm. He was aware his heart was racing. He gets fatigue from that and will have DOE is not SOB now. The chest pain resolved. He took a single Flecainide tablet (possibly 100 mg), but did not convert.   Today, his heart rate was > 150 at times, so he came to the ER. In the ER, his heart rate is elevated, despite Cardizem gtt. He is not having chest pain or SOB.   Past Medical History  Diagnosis Date  . Paroxysmal atrial fibrillation     a. 11/2003 Tikosyn initiated - subsequently d/c'd;  b. 2006 RFCA for Afib @ [facility];  c. 09/2011 Echo: EF 60-65%, Gr 2 DD;  d. 10/2011 DCCV  . Psoriasis   . Dyspepsia   . Deafness   . Kidney stones   . Hyperlipidemia   . Macular degeneration     pt reports that he actually has a "retina problem' not MD  . Pneumonia 2009    in context of Flu  . Atypical chest pain     a. 06/2003 Cath:  nl cors;  b. 11/2006 MV: no ischemia, nl LV    Surgical History:  Past Surgical History  Procedure Laterality Date  . Rotator cuff repair    . Atrial fibrillation ablation  2006    Dr Wharton at [facility]     I have reviewed the patient's current medications. Prior to Admission medications   Medication Sig Start Date End Date Taking? Authorizing Provider  albuterol (PROVENTIL HFA;VENTOLIN HFA) 108 (90 BASE) MCG/ACT inhaler Inhale  into the lungs every 6 (six) hours as needed for wheezing or shortness of breath.   Yes Historical Provider, MD  budesonide-formoterol (SYMBICORT) 160-4.5 MCG/ACT inhaler Inhale 2 puffs into the lungs 2 (two) times daily as needed (for shortness of breath or wheezing).   Yes Historical Provider, MD  cetirizine (ZYRTEC) 10 MG tablet Take 10 mg by mouth daily.     Yes Historical Provider, MD  diltiazem (CARDIZEM CD) 120 MG 24 hr capsule Take 1 capsule (120 mg total) by mouth daily. 04/28/12  Yes Christopher R Berge, NP  etanercept (ENBREL) 25 MG injection Inject 25 mg into the skin every Saturday.   Yes Historical Provider, MD  ferrous sulfate 325 (65 FE) MG tablet Take 325 mg by mouth daily with breakfast.   Yes Historical Provider, MD  folic acid (FOLVITE) 1 MG tablet Take 1 mg by mouth daily.   Yes Historical Provider, MD  methotrexate (RHEUMATREX) 2.5 MG tablet Take 12.5 mg by mouth every Tuesday. Caution:Chemotherapy. Protect from light.   Yes Historical Provider, MD  metoprolol tartrate (LOPRESSOR) 25 MG tablet Take 0.5 tablets (12.5 mg total) by mouth 2 (two) times daily. 04/28/12  Yes Christopher R Berge, NP  Multiple Vitamin (MULTIVITAMIN) tablet Take 1 tablet by mouth daily.   Yes Historical Provider, MD    omeprazole (PRILOSEC) 40 MG capsule Take 40 mg by mouth daily.   Yes Historical Provider, MD  Rivaroxaban (XARELTO) 20 MG TABS tablet Take 20 mg by mouth daily. 04/28/12  Yes Christopher R Berge, NP  zolpidem (AMBIEN) 10 MG tablet Take 10 mg by mouth at bedtime as needed.   Yes Historical Provider, MD   Scheduled Meds: . flecainide  300 mg Oral Once   Continuous Infusions: . diltiazem (CARDIZEM) infusion 5 mg/hr (03/25/13 1057)   PRN Meds:.  Allergies: No Known Allergies  History   Social History  . Marital Status: Married    Spouse Name: N/A    Number of Children: N/A  . Years of Education: N/A   Occupational History  . Retired business owner    Social History Main Topics    . Smoking status: Former Smoker    Quit date: 09/29/2001  . Smokeless tobacco: Not on file  . Alcohol Use: No  . Drug Use: No  . Sexual Activity: Not on file   Other Topics Concern  . Not on file   Social History Narrative   Pt lives in [place] with spouse.     Retired.  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    Family History  Problem Relation Age of Onset  . Coronary artery disease Mother   . Cancer Mother     BREAST,COLON  . Heart disease Maternal Grandmother     MI  . Stroke Paternal Grandfather     CVA  . Coronary artery disease Paternal Grandfather    Family Status  Relation Status Death Age  . Mother Deceased     Review of Systems:   Full 14-point review of systems otherwise negative except as noted above.  Physical Exam: Blood pressure 105/79, pulse 71, temperature 97.5 F (36.4 C), temperature source Oral, resp. rate 23, SpO2 96.00%. General: Well developed, well nourished,male in no acute distress. Head: Normocephalic, atraumatic, sclera non-icteric, no xanthomas, nares are without discharge. Dentition: good Neck: No carotid bruits. JVD not elevated. No thyromegally Lungs: Good expansion bilaterally. without wheezes or rhonchi.  Heart: IRRegular rate and rhythm with S1 S2.  No S3 or S4.  No murmur, no rubs, or gallops appreciated. Abdomen: Soft, non-tender, non-distended with normoactive bowel sounds. No hepatomegaly. No rebound/guarding. No obvious abdominal masses. Msk:  Strength and tone appear normal for age. No joint deformities or effusions, no spine or costo-vertebral angle tenderness. Extremities: No clubbing or cyanosis. No edema.  Distal pedal pulses are 2+ in 4 extrem Neuro: Alert and oriented X 3. Moves all extremities spontaneously. No focal deficits noted. Psych:  Responds to questions appropriately with a normal affect. Skin: No rashes or lesions noted  Labs:   Lab Results  Component Value Date   WBC 6.3 03/25/2013   HGB 15.8  03/25/2013   HCT 44.7 03/25/2013   MCV 88.5 03/25/2013   PLT 198 03/25/2013     Recent Labs Lab 03/25/13 0915  NA 137  K 3.7  CL 103  CO2 24  BUN 12  CREATININE 0.86  CALCIUM 9.4  GLUCOSE 164*    Recent Labs  03/25/13 0929  TROPIPOC 0.00   Radiology/Studies: No results found.   Echo: 10/02/2011 Conclusions - Left ventricle: The cavity size was at the upper limits of normal. Wall thickness was increased in a pattern of mild LVH. Systolic function was normal. The estimated ejection fraction was in the range of 60% to 65%. Wall motion was normal; there were no regional   wall motion abnormalities. Features are consistent with a pseudonormal left ventricular filling pattern, with concomitant abnormal relaxation and increased filling pressure (grade 2 diastolic dysfunction).  - Left atrium: The atrium was mildly to moderately dilated.  ECG: 03/25/2013 Atrial flutter 101 bpm.    ASSESSMENT AND PLAN:  Principal Problem:   Atrial fibrillation with rapid ventricular response - duration < 24  Hours, no change with Flecainide yesterday, but dose probably sub-optimal and it may have been old. Will try with 300 mg x 1 now, f/u on response. If converts with this, may not have to be admitted.  Active Problems:   Substernal chest pain - recheck enzymes in a few hours.   Chronic anticoagulation - continue Xarelto   Signed, Rhonda Barrett, PA-C 03/25/2013 11:45 AM Beeper [phone]  Patient seen and examined.  Agree with findings as noted above by R Barrett.   I have amended reprot to reflect my findings.  First episode of afib since earlier this year.   Will give an additional dose of Flecanide and see if converts.   Check enzymes.    Paula Ross      

## 2013-03-25 NOTE — ED Notes (Signed)
Pt reports his heart racing starting last night.  Pt has had this occur in the past and has been told by his cardiologist to come to the ED when it starts.  Pt in NAD, A&O.

## 2013-03-25 NOTE — Transfer of Care (Signed)
Immediate Anesthesia Transfer of Care Note  Patient: Kenneth [name]  Procedure(s) Performed: Procedure(s): CARDIOVERSION (N/A)  Patient Location: PACU and Nursing Unit  Anesthesia Type:MAC  Level of Consciousness: awake, alert  and oriented  Airway & Oxygen Therapy: Patient Spontanous Breathing  Post-op Assessment: Report given to PACU RN, Post -op Vital signs reviewed and stable and Patient moving all extremities X 4  Post vital signs: Reviewed and stable  Complications: No apparent anesthesia complications

## 2013-03-25 NOTE — ED Notes (Addendum)
Shock delivered to pt.  NSR noted after shock.

## 2013-03-25 NOTE — ED Provider Notes (Signed)
CSN: [id number]     Arrival date & time 03/25/13  0844 History   First MD Initiated Contact with Patient 03/25/13 [phone]     Chief Complaint  Patient presents with  . Tachycardia    Patient is a 67 y.o. male presenting with palpitations. The history is provided by the patient and the spouse.  Palpitations Palpitations quality:  Fast Onset quality:  Sudden Duration:  18 hours Timing:  Constant Progression:  Worsening Chronicity:  Recurrent Relieved by:  Nothing Worsened by:  Nothing tried Associated symptoms: chest pain   Associated symptoms: no cough, no dizziness, no lower extremity edema, no shortness of breath, no syncope and no vomiting   pt reports he had onset of palpitations yesterday It has been constant He tried one dose of flecainide yesterday but this did not improve symptoms He reports taking his home meds this morning (cardizem/metoprolol) But no change since taking morning meds He reports mild CP that is associated with his palpitations No fever/cough/vomiting/diarrhea He had otherwise been well He has been taking xarelto  Past Medical History  Diagnosis Date  . Paroxysmal atrial fibrillation     a. 11/2003 Tikosyn initiated - subsequently d/c'd;  b. 2006 RFCA for Afib @ [facility];  c. 09/2011 Echo: EF 60-65%, Gr 2 DD;  d. 10/2011 DCCV  . Psoriasis   . Dyspepsia   . Deafness   . Kidney stones   . Hyperlipidemia   . Macular degeneration     pt reports that he actually has a "retina problem' not MD  . Pneumonia 2009    in context of Flu  . Atypical chest pain     a. 06/2003 Cath:  nl cors;  b. 11/2006 MV: no ischemia, nl LV   Past Surgical History  Procedure Laterality Date  . Rotator cuff repair    . Atrial fibrillation ablation  2006    Dr Delena Serve at [facility] History  Problem Relation Age of Onset  . Coronary artery disease Mother   . Cancer Mother     BREAST,COLON  . Heart disease Maternal Grandmother     MI  . Stroke Paternal Grandfather     CVA   . Coronary artery disease Paternal Grandfather    History  Substance Use Topics  . Smoking status: Former Smoker    Quit date: 09/29/2001  . Smokeless tobacco: Not on file  . Alcohol Use: No    Review of Systems  Constitutional: Negative for fever.  Respiratory: Negative for cough and shortness of breath.   Cardiovascular: Positive for chest pain and palpitations. Negative for syncope.  Gastrointestinal: Negative for vomiting and diarrhea.  Neurological: Negative for dizziness and syncope.  All other systems reviewed and are negative.    Allergies  Review of patient's allergies indicates no known allergies.  Home Medications   Current Outpatient Rx  Name  Route  Sig  Dispense  Refill  . cetirizine (ZYRTEC) 10 MG tablet   Oral   Take 10 mg by mouth daily.           Marland Kitchen diltiazem (CARDIZEM CD) 120 MG 24 hr capsule   Oral   Take 1 capsule (120 mg total) by mouth daily.   30 capsule   6   . ferrous sulfate 325 (65 FE) MG tablet   Oral   Take 325 mg by mouth daily with breakfast.         . folic acid (FOLVITE) 1 MG tablet   Oral  Take 1 mg by mouth daily.         . methotrexate (RHEUMATREX) 2.5 MG tablet               . metoprolol tartrate (LOPRESSOR) 25 MG tablet   Oral   Take 0.5 tablets (12.5 mg total) by mouth 2 (two) times daily.   30 tablet   3   . Multiple Vitamin (MULTIVITAMIN) tablet   Oral   Take 1 tablet by mouth daily.         Marland Kitchen omeprazole (PRILOSEC) 40 MG capsule   Oral   Take 40 mg by mouth daily.         . Rivaroxaban (XARELTO) 20 MG TABS   Oral   Take 1 tablet (20 mg total) by mouth daily.   30 tablet   6   . zolpidem (AMBIEN) 10 MG tablet   Oral   Take 10 mg by mouth at bedtime as needed.         BP 121/86  Pulse 149  Temp(Src) 97.5 F (36.4 C) (Oral)  Resp 25  SpO2 95% BP 105/73  Pulse 147  Temp(Src) 97.5 F (36.4 C) (Oral)  Resp 23  SpO2 95%  Physical Exam CONSTITUTIONAL: Well developed/well  nourished HEAD: Normocephalic/atraumatic EYES: EOMI/PERRL ENMT: Mucous membranes moist NECK: supple no meningeal signs SPINE:entire spine nontender CV: tachycardic, no murmurs/rubs/gallops noted LUNGS: Lungs are clear to auscultation bilaterally, no apparent distress ABDOMEN: soft, nontender, no rebound or guarding GU:no cva tenderness NEURO: Pt is awake/alert, moves all extremitiesx4 EXTREMITIES: pulses normal, full ROM, no LE edema noted SKIN: warm, color normal PSYCH: no abnormalities of mood noted  ED Course  Procedures  CRITICAL CARE Performed by: Joya Gaskins Total critical care time: 33 Critical care time was exclusive of separately billable procedures and treating other patients. Critical care was necessary to treat or prevent imminent or life-threatening deterioration. Critical care was time spent personally by me on the following activities: development of treatment plan with patient and/or surrogate as well as nursing, discussions with consultants, evaluation of patient's response to treatment, examination of patient, obtaining history from patient or surrogate, ordering and performing treatments and interventions, ordering and review of laboratory studies, ordering and review of radiographic studies, pulse oximetry and re-evaluation of patient's condition.  9:06 AM Pt with h/o afib, he has had ablation before and also has been cardioverted previously.  I suspect he is back in afib/flutter (HR consistently around 150) I have consulted cardiology  9:10 AM D/w dr Tenny Craw cardiology She requests I start cardizem 10mg  bolus and drip to see if rate control works if aflutter is suspect They will evaluate patient 10:41 AM Pt had some response to cardizem, but now tachtycardic, awaiting cardiology evaluation 11:14 AM Pt now on IV gtt of cardizem due to persistent tachycardia Awaiting cardiology evaluation Pt currently stable, but hr > 140 earlier  Labs Review Labs Reviewed   BASIC METABOLIC PANEL  CBC WITH DIFFERENTIAL   Imaging Review No results found.  EKG Interpretation    Date/Time:  Thursday March 25 2013 08:44:38 EST Ventricular Rate:  151 PR Interval:  144 QRS Duration: 110 QT Interval:  252 QTC Calculation: 399 R Axis:   83 Text Interpretation:  indeterminate, suspect atrial flutter Nonspecific T wave abnormality Abnormal ECG Confirmed by Bebe Shaggy  MD, Siegfried Vieth [phone]) on 03/25/2013 8:52:21 AM            EKG Interpretation    Date/Time:  Thursday March 25 2013 10:25:49  EST Ventricular Rate:  101 PR Interval:  144 QRS Duration: 76 QT Interval:  328 QTC Calculation: 425 R Axis:   65 Text Interpretation:  Atrial flutter with varied AV block, Confirmed by Bebe Shaggy  MD, Daulton Harbaugh [phone]) on 03/25/2013 10:40:50 AM             MDM  No diagnosis found. Nursing notes including past medical history and social history reviewed and considered in documentation Previous records reviewed and considered Labs/vital reviewed and considered     Joya Gaskins, MD 03/25/13 1116

## 2013-03-25 NOTE — CV Procedure (Signed)
     Cardioversion Report  Kenneth [name] [id number] 12/18/20144:21 PM Marga Melnick, MD  Procedure Performed:  1. DCCV  Operator: Verne Carrow, MD  Indication:    67 yo male with history of atrial fibrillation. Presented to ED with recurrent atrial fib.                             Procedure Details: The risks, benefits, complications, treatment options, and expected outcomes were discussed with the patient. The patient and/or family concurred with the proposed plan, giving informed consent. Anesthesia was present to provide sedation. Pads were placed on the anterior chest wall and on his upper back. Propofol used for sedation. 200J synchronized energy delivered x 1. Rhythm converted to sinus. There were no immediate complications.  Recs: D/C home today. Follow up in EP clinic.

## 2013-03-25 NOTE — ED Provider Notes (Signed)
Pt given flecainide by cardiology If no improvement he may need cardioversion Pt stable at this time but still tachycardic  Joya Gaskins, MD 03/25/13 1311

## 2013-03-25 NOTE — Progress Notes (Signed)
Spoke with PR. Will add PRN Flecainide 300 mg x 1 for PAF. No other med changes. F/u with Dr. Johney Frame. OK with cards to d/c once awake and safe to leave. Wife updated on plan.

## 2013-03-25 NOTE — ED Notes (Signed)
Pt alert and oriented x4, eating crackers and drinking ginger ale per MD request.

## 2013-03-25 NOTE — ED Provider Notes (Signed)
4:46 PM Patient converted by cardiology. They have deemed him stable for discharge since he is back in sinus rhythm and waking up. They have arranged f/u and will give Rx for flecanide.   Audree Camel, MD 03/25/13 [phone]

## 2013-03-25 NOTE — ED Provider Notes (Signed)
D/w cardiology they are now planning to cardiovert patient and they are arranging for anesthesia to perform sedation They plan to d/c home if cardioversion successful  Joya Gaskins, MD 03/25/13 [phone]

## 2013-03-25 NOTE — H&P (View-Only) (Signed)
CARDIOLOGY CONSULT NOTE   Patient ID: Kenneth [name] MRN: [id number], DOB/AGE: 1945-04-20 67 y.o. Date of Encounter: 03/25/2013  Primary Physician: Marga Melnick, MD Primary Cardiologist: SK/JA  Chief Complaint:  Palpitations  HPI: Kenneth [name] is a 67 y.o. male with a history of atrial fibrillation, on Xarelto. He was ablated in 2006 but has had persistent, intermittent, symptomatic atrial fibrillation. He was seen in March 2014 and ablation was recommended. However, he was maintaining SR and did not want to have any other procedures. He has done well since then, until yesterday.  Yesterday, he had sudden onset of SSCP at about 3 pm. He was aware his heart was racing. He gets fatigue from that and will have DOE is not SOB now. The chest pain resolved. He took a single Flecainide tablet (possibly 100 mg), but did not convert.   Today, his heart rate was > 150 at times, so he came to the ER. In the ER, his heart rate is elevated, despite Cardizem gtt. He is not having chest pain or SOB.   Past Medical History  Diagnosis Date  . Paroxysmal atrial fibrillation     a. 11/2003 Tikosyn initiated - subsequently d/c'd;  b. 2006 RFCA for Afib @ [facility];  c. 09/2011 Echo: EF 60-65%, Gr 2 DD;  d. 10/2011 DCCV  . Psoriasis   . Dyspepsia   . Deafness   . Kidney stones   . Hyperlipidemia   . Macular degeneration     pt reports that he actually has a "retina problem' not MD  . Pneumonia 2009    in context of Flu  . Atypical chest pain     a. 06/2003 Cath:  nl cors;  b. 11/2006 MV: no ischemia, nl LV    Surgical History:  Past Surgical History  Procedure Laterality Date  . Rotator cuff repair    . Atrial fibrillation ablation  2006    Dr Delena Serve at [facility]     I have reviewed the patient's current medications. Prior to Admission medications   Medication Sig Start Date End Date Taking? Authorizing Provider  albuterol (PROVENTIL HFA;VENTOLIN HFA) 108 (90 BASE) MCG/ACT inhaler Inhale  into the lungs every 6 (six) hours as needed for wheezing or shortness of breath.   Yes Historical Provider, MD  budesonide-formoterol (SYMBICORT) 160-4.5 MCG/ACT inhaler Inhale 2 puffs into the lungs 2 (two) times daily as needed (for shortness of breath or wheezing).   Yes Historical Provider, MD  cetirizine (ZYRTEC) 10 MG tablet Take 10 mg by mouth daily.     Yes Historical Provider, MD  diltiazem (CARDIZEM CD) 120 MG 24 hr capsule Take 1 capsule (120 mg total) by mouth daily. 04/28/12  Yes Ok Anis, NP  etanercept (ENBREL) 25 MG injection Inject 25 mg into the skin every Saturday.   Yes Historical Provider, MD  ferrous sulfate 325 (65 FE) MG tablet Take 325 mg by mouth daily with breakfast.   Yes Historical Provider, MD  folic acid (FOLVITE) 1 MG tablet Take 1 mg by mouth daily.   Yes Historical Provider, MD  methotrexate (RHEUMATREX) 2.5 MG tablet Take 12.5 mg by mouth every Tuesday. Caution:Chemotherapy. Protect from light.   Yes Historical Provider, MD  metoprolol tartrate (LOPRESSOR) 25 MG tablet Take 0.5 tablets (12.5 mg total) by mouth 2 (two) times daily. 04/28/12  Yes Ok Anis, NP  Multiple Vitamin (MULTIVITAMIN) tablet Take 1 tablet by mouth daily.   Yes Historical Provider, MD  omeprazole (PRILOSEC) 40 MG capsule Take 40 mg by mouth daily.   Yes Historical Provider, MD  Rivaroxaban (XARELTO) 20 MG TABS tablet Take 20 mg by mouth daily. 04/28/12  Yes Ok Anis, NP  zolpidem (AMBIEN) 10 MG tablet Take 10 mg by mouth at bedtime as needed.   Yes Historical Provider, MD   Scheduled Meds: . flecainide  300 mg Oral Once   Continuous Infusions: . diltiazem (CARDIZEM) infusion 5 mg/hr (03/25/13 1057)   PRN Meds:.  Allergies: No Known Allergies  History   Social History  . Marital Status: Married    Spouse Name: N/A    Number of Children: N/A  . Years of Education: N/A   Occupational History  . Retired Psychologist, sport and exercise    Social History Main Topics    . Smoking status: Former Smoker    Quit date: 09/29/2001  . Smokeless tobacco: Not on file  . Alcohol Use: No  . Drug Use: No  . Sexual Activity: Not on file   Other Topics Concern  . Not on file   Social History Narrative   Pt lives in [place] with spouse.     Retired.  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    Family History  Problem Relation Age of Onset  . Coronary artery disease Mother   . Cancer Mother     BREAST,COLON  . Heart disease Maternal Grandmother     MI  . Stroke Paternal Grandfather     CVA  . Coronary artery disease Paternal Grandfather    Family Status  Relation Status Death Age  . Mother Deceased     Review of Systems:   Full 14-point review of systems otherwise negative except as noted above.  Physical Exam: Blood pressure 105/79, pulse 71, temperature 97.5 F (36.4 C), temperature source Oral, resp. rate 23, SpO2 96.00%. General: Well developed, well nourished,male in no acute distress. Head: Normocephalic, atraumatic, sclera non-icteric, no xanthomas, nares are without discharge. Dentition: good Neck: No carotid bruits. JVD not elevated. No thyromegally Lungs: Good expansion bilaterally. without wheezes or rhonchi.  Heart: IRRegular rate and rhythm with S1 S2.  No S3 or S4.  No murmur, no rubs, or gallops appreciated. Abdomen: Soft, non-tender, non-distended with normoactive bowel sounds. No hepatomegaly. No rebound/guarding. No obvious abdominal masses. Msk:  Strength and tone appear normal for age. No joint deformities or effusions, no spine or costo-vertebral angle tenderness. Extremities: No clubbing or cyanosis. No edema.  Distal pedal pulses are 2+ in 4 extrem Neuro: Alert and oriented X 3. Moves all extremities spontaneously. No focal deficits noted. Psych:  Responds to questions appropriately with a normal affect. Skin: No rashes or lesions noted  Labs:   Lab Results  Component Value Date   WBC 6.3 03/25/2013   HGB 15.8  03/25/2013   HCT 44.7 03/25/2013   MCV 88.5 03/25/2013   PLT 198 03/25/2013     Recent Labs Lab 03/25/13 0915  NA 137  K 3.7  CL 103  CO2 24  BUN 12  CREATININE 0.86  CALCIUM 9.4  GLUCOSE 164*    Recent Labs  03/25/13 0929  TROPIPOC 0.00   Radiology/Studies: No results found.   Echo: 10/02/2011 Conclusions - Left ventricle: The cavity size was at the upper limits of normal. Wall thickness was increased in a pattern of mild LVH. Systolic function was normal. The estimated ejection fraction was in the range of 60% to 65%. Wall motion was normal; there were no regional  wall motion abnormalities. Features are consistent with a pseudonormal left ventricular filling pattern, with concomitant abnormal relaxation and increased filling pressure (grade 2 diastolic dysfunction).  - Left atrium: The atrium was mildly to moderately dilated.  ECG: 03/25/2013 Atrial flutter 101 bpm.    ASSESSMENT AND PLAN:  Principal Problem:   Atrial fibrillation with rapid ventricular response - duration < 24  Hours, no change with Flecainide yesterday, but dose probably sub-optimal and it may have been old. Will try with 300 mg x 1 now, f/u on response. If converts with this, may not have to be admitted.  Active Problems:   Substernal chest pain - recheck enzymes in a few hours.   Chronic anticoagulation - continue Xarelto   Melida Quitter, PA-C 03/25/2013 11:45 AM Beeper [phone]  Patient seen and examined.  Agree with findings as noted above by R Barrett.   I have amended reprot to reflect my findings.  First episode of afib since earlier this year.   Will give an additional dose of Flecanide and see if converts.   Check enzymes.    Dietrich Pates

## 2013-03-25 NOTE — Preoperative (Signed)
Beta Blockers   Reason not to administer Beta Blockers:metoprolo 12-18

## 2013-03-25 NOTE — Interval H&P Note (Signed)
History and Physical Interval Note:  03/25/2013 4:07 PM  Kenneth [name]  has presented today for surgery, with the diagnosis of aflutter  The various methods of treatment have been discussed with the patient and family. After consideration of risks, benefits and other options for treatment, the patient has consented to  Procedure(s): CARDIOVERSION (N/A) as a surgical intervention .  The patient's history has been reviewed, patient examined, no change in status, stable for surgery.  I have reviewed the patient's chart and labs.  Questions were answered to the patient's satisfaction.     Dietrich Pates

## 2013-03-25 NOTE — ED Notes (Addendum)
Anaesthesiologist, Dr Randa Evens, and cardiologist, Dr Clifton James at beside to perform procedure.

## 2013-04-07 ENCOUNTER — Encounter: Payer: Self-pay | Admitting: Internal Medicine

## 2013-04-08 DIAGNOSIS — I480 Paroxysmal atrial fibrillation: Secondary | ICD-10-CM

## 2013-04-08 HISTORY — DX: Paroxysmal atrial fibrillation: I48.0

## 2013-04-14 ENCOUNTER — Other Ambulatory Visit: Payer: Self-pay

## 2013-04-14 MED ORDER — RIVAROXABAN 20 MG PO TABS: 20.0000 mg | ORAL_TABLET | Freq: Every day | ORAL | Status: DC

## 2013-05-03 ENCOUNTER — Ambulatory Visit (INDEPENDENT_AMBULATORY_CARE_PROVIDER_SITE_OTHER): Payer: Medicare Other | Admitting: Internal Medicine

## 2013-05-03 ENCOUNTER — Encounter: Payer: Self-pay | Admitting: Internal Medicine

## 2013-05-03 VITALS — BP 132/84 | HR 75 | Ht 75.0 in | Wt 268.0 lb

## 2013-05-03 DIAGNOSIS — I4891 Unspecified atrial fibrillation: Secondary | ICD-10-CM

## 2013-05-03 DIAGNOSIS — I4892 Unspecified atrial flutter: Secondary | ICD-10-CM

## 2013-05-03 MED ORDER — BUDESONIDE-FORMOTEROL FUMARATE 160-4.5 MCG/ACT IN AERO: 2.0000 | INHALATION_SPRAY | RESPIRATORY_TRACT | Status: DC

## 2013-05-03 NOTE — Patient Instructions (Signed)
Your physician wants you to follow-up in: 6 months with Dr Rayann Heman with an Echo You will receive a reminder letter in the mail two months in advance. If you don't receive a letter, please call our office to schedule the follow-up appointment.    Your physician has requested that you have an echocardiogram. Echocardiography is a painless test that uses sound waves to create images of your heart. It provides your doctor with information about the size and shape of your heart and how well your heart's chambers and valves are working. This procedure takes approximately one hour. There are no restrictions for this procedure.---in 6 months prior to appointment

## 2013-05-05 ENCOUNTER — Encounter: Payer: Medicare Other | Admitting: Internal Medicine

## 2013-05-08 NOTE — Progress Notes (Signed)
PCP: Unice Cobble, MD Primary Cardiologist:  Stanford Breed Primary EP:  Jerold Yoss is a 68 y.o. male who presents today for routine electrophysiology followup.  Since last being seen in our clinic, the patient has developed recurrent atypical atrial flutter.  He was recently cardioverted by Dr Angelena Form.  He has been able to maintain sinus rhythm since that time.  Today, he denies symptoms of palpitations, chest pain, shortness of breath,  lower extremity edema, dizziness, presyncope, or syncope.  The patient is otherwise without complaint today.   Past Medical History  Diagnosis Date  . Paroxysmal atrial fibrillation     a. 11/2003 Tikosyn initiated - subsequently d/c'd;  b. 2006 RFCA for Afib @ [facility];  c. 09/2011 Echo: EF 60-65%, Gr 2 DD;  d. 10/2011 DCCV  . Psoriasis   . Dyspepsia   . Deafness   . Kidney stones   . Hyperlipidemia   . Macular degeneration     pt reports that he actually has a "retina problem' not MD  . Pneumonia 2009    in context of Flu  . Atypical chest pain     a. 06/2003 Cath:  nl cors;  b. 11/2006 MV: no ischemia, nl LV   Past Surgical History  Procedure Laterality Date  . Rotator cuff repair    . Atrial fibrillation ablation  2006    Dr Rolland Porter at [facility]    Current Outpatient Prescriptions  Medication Sig Dispense Refill  . albuterol (PROVENTIL HFA;VENTOLIN HFA) 108 (90 BASE) MCG/ACT inhaler Inhale into the lungs every 6 (six) hours as needed for wheezing or shortness of breath.      Marland Kitchen b complex vitamins capsule Take 1 capsule by mouth daily.      . cetirizine (ZYRTEC) 10 MG tablet Take 10 mg by mouth daily.        Marland Kitchen diltiazem (CARDIZEM CD) 120 MG 24 hr capsule Take 1 capsule (120 mg total) by mouth daily.  30 capsule  6  . etanercept (ENBREL) 25 MG injection Inject 50 mg into the skin every Saturday.       . ferrous sulfate 325 (65 FE) MG tablet Take 325 mg by mouth daily with breakfast.      . folic acid (FOLVITE) 1 MG tablet Take 1 mg by mouth daily.       . Magnesium 400 MG CAPS Take 450 mg twice a day      . methotrexate (RHEUMATREX) 2.5 MG tablet Take 5 mg by mouth every Tuesday. Caution:Chemotherapy. Protect from light.      . metoprolol tartrate (LOPRESSOR) 25 MG tablet Take 0.5 tablets (12.5 mg total) by mouth 2 (two) times daily.  30 tablet  3  . Multiple Vitamin (MULTIVITAMIN) tablet Take 1 tablet by mouth daily.      Marland Kitchen omeprazole (PRILOSEC) 20 MG capsule Take 20 mg by mouth 2 (two) times daily before a meal.      . Rivaroxaban (XARELTO) 20 MG TABS tablet Take 1 tablet (20 mg total) by mouth daily.  90 tablet  2  . zolpidem (AMBIEN) 10 MG tablet Take 10 mg by mouth at bedtime as needed.      . budesonide-formoterol (SYMBICORT) 160-4.5 MCG/ACT inhaler Inhale 2 puffs into the lungs as directed.  1 Inhaler  0   No current facility-administered medications for this visit.   Facility-Administered Medications Ordered in Other Visits  Medication Dose Route Frequency Provider Last Rate Last Dose  . lidocaine (cardiac) 100 mg/14ml (XYLOCAINE)  20 MG/ML injection 2%    Anesthesia Intra-op Durwin Glaze Flowers, CRNA   60 mg at 03/25/13 1617  . propofol (DIPRIVAN) 10 mg/mL bolus/IV push    Anesthesia Intra-op Durwin Glaze Flowers, CRNA   90 mg at 03/25/13 1617    Physical Exam: Filed Vitals:   05/03/13 1009  BP: 132/84  Pulse: 75  Height: 6\' 3"  (1.905 m)  Weight: 268 lb (121.564 kg)    GEN- The patient is well appearing, alert and oriented x 3 today.   Head- normocephalic, atraumatic Eyes-  Sclera clear, conjunctiva pink Ears- hearing intact Oropharynx- clear Lungs- Clear to ausculation bilaterally, normal work of breathing Heart- Regular rate and rhythm, no murmurs, rubs or gallops, PMI not laterally displaced GI- soft, NT, ND, + BS Extremities- no clubbing, cyanosis, or edema  ekg today reveals sinus rhythm with PACs, nonspecific ST/T changes  Assessment and Plan:  1. Afib/ atypical atrial flutter Maintaining sinus since his recent  cardioversion As episodes of afib/ atypical atrial flutter remain infrequent, he would prefer to continue his present strategy and defer ablation.  If his episodes increase in frequency then he may be more willing to consider ablation at that time. His CHADS2VASC score is 1.  He would prefer to continue xarelto at this time.  No changes are made today Return in 6 months, with an echo at that time.

## 2013-05-21 ENCOUNTER — Inpatient Hospital Stay (HOSPITAL_COMMUNITY)
Admission: EM | Admit: 2013-05-21 | Discharge: 2013-05-22 | DRG: 309 | Disposition: A | Discharge status: Home / Self Care | Payer: Medicare Other | Attending: Internal Medicine | Admitting: Internal Medicine

## 2013-05-21 ENCOUNTER — Emergency Department (HOSPITAL_COMMUNITY): Payer: Medicare Other

## 2013-05-21 ENCOUNTER — Encounter (HOSPITAL_COMMUNITY): Payer: Self-pay | Admitting: Emergency Medicine

## 2013-05-21 DIAGNOSIS — J4489 Other specified chronic obstructive pulmonary disease: Secondary | ICD-10-CM | POA: Diagnosis present

## 2013-05-21 DIAGNOSIS — G4733 Obstructive sleep apnea (adult) (pediatric): Secondary | ICD-10-CM

## 2013-05-21 DIAGNOSIS — J328 Other chronic sinusitis: Secondary | ICD-10-CM

## 2013-05-21 DIAGNOSIS — E785 Hyperlipidemia, unspecified: Secondary | ICD-10-CM | POA: Diagnosis present

## 2013-05-21 DIAGNOSIS — Z8249 Family history of ischemic heart disease and other diseases of the circulatory system: Secondary | ICD-10-CM

## 2013-05-21 DIAGNOSIS — Z87442 Personal history of urinary calculi: Secondary | ICD-10-CM

## 2013-05-21 DIAGNOSIS — J309 Allergic rhinitis, unspecified: Secondary | ICD-10-CM

## 2013-05-21 DIAGNOSIS — J8409 Other alveolar and parieto-alveolar conditions: Secondary | ICD-10-CM

## 2013-05-21 DIAGNOSIS — I4892 Unspecified atrial flutter: Principal | ICD-10-CM

## 2013-05-21 DIAGNOSIS — I5031 Acute diastolic (congestive) heart failure: Secondary | ICD-10-CM

## 2013-05-21 DIAGNOSIS — J449 Chronic obstructive pulmonary disease, unspecified: Secondary | ICD-10-CM | POA: Diagnosis present

## 2013-05-21 DIAGNOSIS — J45901 Unspecified asthma with (acute) exacerbation: Secondary | ICD-10-CM

## 2013-05-21 DIAGNOSIS — I509 Heart failure, unspecified: Secondary | ICD-10-CM | POA: Diagnosis present

## 2013-05-21 DIAGNOSIS — Z87891 Personal history of nicotine dependence: Secondary | ICD-10-CM

## 2013-05-21 DIAGNOSIS — R0602 Shortness of breath: Secondary | ICD-10-CM

## 2013-05-21 DIAGNOSIS — A088 Other specified intestinal infections: Secondary | ICD-10-CM | POA: Diagnosis present

## 2013-05-21 DIAGNOSIS — K529 Noninfective gastroenteritis and colitis, unspecified: Secondary | ICD-10-CM

## 2013-05-21 DIAGNOSIS — Z823 Family history of stroke: Secondary | ICD-10-CM

## 2013-05-21 DIAGNOSIS — E872 Acidosis, unspecified: Secondary | ICD-10-CM | POA: Diagnosis present

## 2013-05-21 DIAGNOSIS — I5032 Chronic diastolic (congestive) heart failure: Secondary | ICD-10-CM

## 2013-05-21 DIAGNOSIS — H9190 Unspecified hearing loss, unspecified ear: Secondary | ICD-10-CM | POA: Diagnosis present

## 2013-05-21 DIAGNOSIS — E860 Dehydration: Secondary | ICD-10-CM

## 2013-05-21 DIAGNOSIS — G4700 Insomnia, unspecified: Secondary | ICD-10-CM

## 2013-05-21 DIAGNOSIS — R197 Diarrhea, unspecified: Secondary | ICD-10-CM

## 2013-05-21 DIAGNOSIS — R072 Precordial pain: Secondary | ICD-10-CM

## 2013-05-21 DIAGNOSIS — I4891 Unspecified atrial fibrillation: Secondary | ICD-10-CM

## 2013-05-21 DIAGNOSIS — Z7901 Long term (current) use of anticoagulants: Secondary | ICD-10-CM

## 2013-05-21 LAB — COMPREHENSIVE METABOLIC PANEL
ALK PHOS: 84 U/L (ref 39–117)
ALT: 45 U/L (ref 0–53)
AST: 49 U/L — ABNORMAL HIGH (ref 0–37)
Albumin: 4.1 g/dL (ref 3.5–5.2)
BILIRUBIN TOTAL: 0.8 mg/dL (ref 0.3–1.2)
BUN: 14 mg/dL (ref 6–23)
CHLORIDE: 98 meq/L (ref 96–112)
CO2: 14 mEq/L — ABNORMAL LOW (ref 19–32)
Calcium: 9.2 mg/dL (ref 8.4–10.5)
Creatinine, Ser: 1.04 mg/dL (ref 0.50–1.35)
GFR, EST AFRICAN AMERICAN: 84 mL/min — AB (ref >90)
GFR, EST NON AFRICAN AMERICAN: 72 mL/min — AB (ref >90)
GLUCOSE: 149 mg/dL — AB (ref 70–99)
POTASSIUM: 3.6 meq/L — AB (ref 3.7–5.3)
Sodium: 133 mEq/L — ABNORMAL LOW (ref 137–147)
Total Protein: 8.1 g/dL (ref 6.0–8.3)

## 2013-05-21 LAB — CG4 I-STAT (LACTIC ACID): Lactic Acid, Venous: 3.03 mmol/L — ABNORMAL HIGH (ref 0.5–2.2)

## 2013-05-21 LAB — CBC
HEMATOCRIT: 47.2 % (ref 39.0–52.0)
HEMOGLOBIN: 17.1 g/dL — AB (ref 13.0–17.0)
MCH: 30.6 pg (ref 26.0–34.0)
MCHC: 36.2 g/dL — ABNORMAL HIGH (ref 30.0–36.0)
MCV: 84.4 fL (ref 78.0–100.0)
Platelets: 224 10*3/uL (ref 150–400)
RBC: 5.59 MIL/uL (ref 4.22–5.81)
RDW: 13.7 % (ref 11.5–15.5)
WBC: 8 10*3/uL (ref 4.0–10.5)

## 2013-05-21 LAB — POCT I-STAT TROPONIN I: TROPONIN I, POC: 0.03 ng/mL (ref 0.00–0.08)

## 2013-05-21 MED ORDER — DILTIAZEM HCL 100 MG IV SOLR
5.0000 mg/h | INTRAVENOUS | Status: DC
  Administered 2013-05-22: 5 mg/h via INTRAVENOUS

## 2013-05-21 MED ORDER — DILTIAZEM HCL 25 MG/5ML IV SOLN
20.0000 mg | Freq: Once | INTRAVENOUS | Status: AC
  Administered 2013-05-22: 20 mg via INTRAVENOUS
  Filled 2013-05-21: qty 5

## 2013-05-21 MED ORDER — DILTIAZEM HCL 25 MG/5ML IV SOLN
20.0000 mg | Freq: Once | INTRAVENOUS | Status: AC
  Administered 2013-05-21: 20 mg via INTRAVENOUS
  Filled 2013-05-21: qty 5

## 2013-05-21 MED ORDER — SODIUM CHLORIDE 0.9 % IV BOLUS (SEPSIS)
1000.0000 mL | Freq: Once | INTRAVENOUS | Status: AC
  Administered 2013-05-21: 1000 mL via INTRAVENOUS

## 2013-05-21 NOTE — ED Notes (Signed)
Portable DG at bedside.

## 2013-05-21 NOTE — ED Notes (Signed)
Notified MD of elevated Lactic Acid.

## 2013-05-21 NOTE — ED Notes (Signed)
Pt wheeled from triage, pt wife at bedside pt placed on cardiac, pulse ox and BP monitor. EKG given to dr. Maryan Rued. Dr. Maryan Rued at bedside at 2005. Pt c/o palpitations, diaphoresis and lightheadedness.

## 2013-05-21 NOTE — ED Notes (Signed)
Pt has had "food poisoning" x 3 days with nausea and multiple episodes of vomiting and diarrhea. Pt received nausea medicine and fluids and wife's PCP, today pt developed dull chest pain at appx 12:30 pm, pt attributed to reflux. Pain did not dissipate, pt begin feeling clammy and lightheaded in the past few hours.

## 2013-05-21 NOTE — ED Notes (Addendum)
Patient with history of nausea, vomiting and diarrhea since Tuesday.  Patient continues with nausea and diarrhea.  Patient is having RVR at 158.  Patient is having chest pain and shortness of breath.

## 2013-05-21 NOTE — ED Notes (Signed)
Pt sts feels like he needs to have BM- RN attempted to use bedpan with pt, pt sts uncomfortable and unable to have BM. Bedside commode placed in room and pt assisted to bedside commode and given call bell.

## 2013-05-21 NOTE — ED Provider Notes (Addendum)
CSN: [id number]     Arrival date & time 05/21/13  1953 History   First MD Initiated Contact with Patient 05/21/13 2004     Chief Complaint  Patient presents with  . Tachycardia  . Diarrhea     (Consider location/radiation/quality/duration/timing/severity/associated sxs/prior Treatment) Patient is a 68 y.o. male presenting with diarrhea and palpitations. The history is provided by the patient and the spouse.  Diarrhea Quality:  Watery (green and smelly) Severity:  Severe Onset quality:  Sudden Number of episodes:  Numerous >10 per day Duration:  4 days Timing:  Constant Progression:  Unchanged Relieved by:  Nothing Exacerbated by: foods. Ineffective treatments:  Anti-motility medications Associated symptoms: vomiting   Associated symptoms: no abdominal pain   Associated symptoms comment:  On tues started with nausea and vomiting but vomiting has subsided but nausea persists Risk factors: no recent antibiotic use, no suspicious food intake and no travel to endemic areas   Palpitations Palpitations quality:  Fast Onset quality:  Sudden Duration:  2 hours Timing:  Constant Progression:  Unchanged Chronicity:  Recurrent Context comment:  Noticed at lunch some SOB and then around 7pm noticed palpitations and at that time found himself to be in a.fib rvr Relieved by:  Nothing Worsened by:  Nothing tried Ineffective treatments: took flecanide about 1 hour pta. Associated symptoms: chest pressure, nausea, shortness of breath and vomiting   Associated symptoms: no back pain   Risk factors: hx of atrial fibrillation     Past Medical History  Diagnosis Date  . Paroxysmal atrial fibrillation     a. 11/2003 Tikosyn initiated - subsequently d/c'd;  b. 2006 RFCA for Afib @ [facility];  c. 09/2011 Echo: EF 60-65%, Gr 2 DD;  d. 10/2011 DCCV  . Psoriasis   . Dyspepsia   . Deafness   . Kidney stones   . Hyperlipidemia   . Macular degeneration     pt reports that he actually has a "retina  problem' not MD  . Pneumonia 2009    in context of Flu  . Atypical chest pain     a. 06/2003 Cath:  nl cors;  b. 11/2006 MV: no ischemia, nl LV   Past Surgical History  Procedure Laterality Date  . Rotator cuff repair    . Atrial fibrillation ablation  2006    Dr Rolland Porter at [facility] History  Problem Relation Age of Onset  . Coronary artery disease Mother   . Cancer Mother     BREAST,COLON  . Heart disease Maternal Grandmother     MI  . Stroke Paternal Grandfather     CVA  . Coronary artery disease Paternal Grandfather    History  Substance Use Topics  . Smoking status: Former Smoker    Quit date: 09/29/2001  . Smokeless tobacco: Not on file  . Alcohol Use: No    Review of Systems  Respiratory: Positive for shortness of breath.   Cardiovascular: Positive for palpitations.  Gastrointestinal: Positive for nausea, vomiting and diarrhea. Negative for abdominal pain.  Musculoskeletal: Negative for back pain.  All other systems reviewed and are negative.      Allergies  Decongestant and Pseudoephedrine  Home Medications   Current Outpatient Rx  Name  Route  Sig  Dispense  Refill  . b complex vitamins capsule   Oral   Take 1 capsule by mouth daily.         . budesonide-formoterol (SYMBICORT) 160-4.5 MCG/ACT inhaler   Inhalation   Inhale 2  puffs into the lungs as directed.   1 Inhaler   0   . cetirizine (ZYRTEC) 10 MG tablet   Oral   Take 10 mg by mouth daily.           Marland Kitchen diltiazem (CARDIZEM CD) 120 MG 24 hr capsule   Oral   Take 1 capsule (120 mg total) by mouth daily.   30 capsule   6   . etanercept (ENBREL) 25 MG injection   Subcutaneous   Inject 50 mg into the skin every Saturday.          . ferrous sulfate 325 (65 FE) MG tablet   Oral   Take 325 mg by mouth daily with breakfast.         . folic acid (FOLVITE) 1 MG tablet   Oral   Take 1 mg by mouth daily.         . methotrexate (RHEUMATREX) 2.5 MG tablet   Oral   Take 5 mg by  mouth every Tuesday. Caution:Chemotherapy. Protect from light.         . metoprolol tartrate (LOPRESSOR) 25 MG tablet   Oral   Take 0.5 tablets (12.5 mg total) by mouth 2 (two) times daily.   30 tablet   3   . omeprazole (PRILOSEC) 20 MG capsule   Oral   Take 20 mg by mouth 2 (two) times daily before a meal.         . Rivaroxaban (XARELTO) 20 MG TABS tablet   Oral   Take 1 tablet (20 mg total) by mouth daily.   90 tablet   2   . zolpidem (AMBIEN) 10 MG tablet   Oral   Take 10 mg by mouth at bedtime as needed.         Marland Kitchen albuterol (PROVENTIL HFA;VENTOLIN HFA) 108 (90 BASE) MCG/ACT inhaler   Inhalation   Inhale into the lungs every 6 (six) hours as needed for wheezing or shortness of breath.         . flecainide (TAMBOCOR) 100 MG tablet   Oral   Take 300 mg by mouth as needed (for SVT).          . Magnesium 400 MG CAPS   Oral   Take 400 mg by mouth 2 (two) times daily.          . Multiple Vitamin (MULTIVITAMIN) tablet   Oral   Take 1 tablet by mouth daily.          BP 135/79  Pulse 139  Temp(Src) 97.5 F (36.4 C) (Oral)  Resp 13  Ht 6\' 3"  (1.905 m)  SpO2 98% Physical Exam  Nursing note and vitals reviewed. Constitutional: He is oriented to person, place, and time. He appears well-developed and well-nourished. He appears distressed.  HENT:  Head: Normocephalic and atraumatic.  Mouth/Throat: Oropharynx is clear and moist. Mucous membranes are dry.  Eyes: Conjunctivae and EOM are normal. Pupils are equal, round, and reactive to light.  Neck: Normal range of motion. Neck supple.  Cardiovascular: Intact distal pulses.  An irregularly irregular rhythm present. Tachycardia present.   No murmur heard. Pulmonary/Chest: Effort normal and breath sounds normal. No respiratory distress. He has no wheezes. He has no rales.  Abdominal: Soft. He exhibits no distension. There is generalized tenderness. There is no rebound and no guarding.  Mild diffuse tenderness.   No focal tenderness  Musculoskeletal: Normal range of motion. He exhibits no edema and no tenderness.  Neurological: He  is alert and oriented to person, place, and time.  Skin: Skin is warm and dry. No rash noted. No erythema. There is pallor.  Psychiatric: He has a normal mood and affect. His behavior is normal.    ED Course  Procedures (including critical care time) Labs Review Labs Reviewed  CBC - Abnormal; Notable for the following:    Hemoglobin 17.1 (*)    MCHC 36.2 (*)    All other components within normal limits  COMPREHENSIVE METABOLIC PANEL - Abnormal; Notable for the following:    Sodium 133 (*)    Potassium 3.6 (*)    CO2 14 (*)    Glucose, Bld 149 (*)    AST 49 (*)    GFR calc non Af Amer 72 (*)    GFR calc Af Amer 84 (*)    All other components within normal limits  CG4 I-STAT (LACTIC ACID) - Abnormal; Notable for the following:    Lactic Acid, Venous 3.03 (*)    All other components within normal limits  POCT I-STAT TROPONIN I  I-STAT CG4 LACTIC ACID, ED   Imaging Review Dg Chest Port 1 View  05/21/2013   CLINICAL DATA:  Chest pain, shortness of breath.  EXAM: PORTABLE CHEST - 1 VIEW  COMPARISON:  October 22, 2011.  FINDINGS: The heart size and mediastinal contours are within normal limits. Both lungs are clear. No pleural effusion or pneumothorax is noted. The visualized skeletal structures are unremarkable.  IMPRESSION: No acute cardiopulmonary abnormality seen.   Electronically Signed   By: Sabino Dick M.D.   On: 05/21/2013 20:44    EKG Interpretation    Date/Time:  Friday May 21 2013 20:02:55 EST Ventricular Rate:  157 PR Interval:    QRS Duration: 79 QT Interval:  311 QTC Calculation: 503 R Axis:   43 Text Interpretation:  recurrent Atrial fibrillation with rapid V-rate ekg from 03/25/13 showed sinus rhythm Confirmed by Maryan Rued  MD, Blair Lundeen ([id number]) on 05/21/2013 8:28:52 PM            MDM   Final diagnoses:  Atrial fibrillation with RVR   Diarrhea  Dehydration   Pt with N/V/D since tues for the last 4 days.  Vomiting has resolved but diarrhea is still numerous times a day and not eating or drinking.  Around 12noon pt started having breathing problems and then at 7pm they noted he was in afib and wife gave him flecanide and xarelto.  Pt is in afib rvr currently at 150 but normal BP and O2 sats.  Pt is c/o of SOB and chest pressure.  Initial EKG showed no ST elevation. Since patient took flecainide approximately one hour prior to arrival we will hydrate and give the medication longer to work to ensure that he does not convert the fact. In the past he's had to have cardioversion. Will discuss with cardiology.  Secondly for nausea vomiting and diarrhea so the patient is severely dehydrated as he is not eating or drinking anything for multiple days and continues to have persistent diarrhea. He denies any antibiotic use in the last 2 months and saw his PCP on Wednesday and stool studies were done but he has not had the results from those yet. He has no focal abdominal tenderness concerning for acute abdominal process.  9:35 PM Chest x-ray is within normal limits. Troponin is negative. Lactate is elevated at 3.03 with hemoconcentration and a CBC but otherwise normal CBC and normal creatinine.  10:51 PM HR improved to  the 130s with still a.fib after fluids and no change with flecanide so pt given cardizem bolus and another fluid bolus.  11:16 PM After cardizem bolus improvement in rate to low 100's with flutter but no conversion. Pt given second bolus and started on a gtt.  Cards and medicine consulted.  CRITICAL CARE Performed by: Blanchie Dessert Total critical care time: 30 Critical care time was exclusive of separately billable procedures and treating other patients. Critical care was necessary to treat or prevent imminent or life-threatening deterioration. Critical care was time spent personally by me on the following  activities: development of treatment plan with patient and/or surrogate as well as nursing, discussions with consultants, evaluation of patient's response to treatment, examination of patient, obtaining history from patient or surrogate, ordering and performing treatments and interventions, ordering and review of laboratory studies, ordering and review of radiographic studies, pulse oximetry and re-evaluation of patient's condition.   Blanchie Dessert, MD 05/22/13 [id number]  Blanchie Dessert, MD 05/22/13 [phone]

## 2013-05-22 ENCOUNTER — Encounter (HOSPITAL_COMMUNITY): Payer: Self-pay | Admitting: *Deleted

## 2013-05-22 ENCOUNTER — Inpatient Hospital Stay (HOSPITAL_COMMUNITY): Payer: Medicare Other | Admitting: Certified Registered Nurse Anesthetist

## 2013-05-22 ENCOUNTER — Other Ambulatory Visit: Payer: Self-pay | Admitting: Internal Medicine

## 2013-05-22 ENCOUNTER — Encounter (HOSPITAL_COMMUNITY)
Admission: EM | Disposition: A | Discharge status: Home / Self Care | Payer: Self-pay | Source: Home / Self Care | Attending: Internal Medicine

## 2013-05-22 ENCOUNTER — Encounter (HOSPITAL_COMMUNITY): Payer: Medicare Other | Admitting: Certified Registered Nurse Anesthetist

## 2013-05-22 DIAGNOSIS — I4892 Unspecified atrial flutter: Principal | ICD-10-CM

## 2013-05-22 DIAGNOSIS — I5032 Chronic diastolic (congestive) heart failure: Secondary | ICD-10-CM

## 2013-05-22 DIAGNOSIS — Z7901 Long term (current) use of anticoagulants: Secondary | ICD-10-CM

## 2013-05-22 DIAGNOSIS — R072 Precordial pain: Secondary | ICD-10-CM

## 2013-05-22 DIAGNOSIS — K529 Noninfective gastroenteritis and colitis, unspecified: Secondary | ICD-10-CM | POA: Diagnosis present

## 2013-05-22 DIAGNOSIS — I509 Heart failure, unspecified: Secondary | ICD-10-CM

## 2013-05-22 DIAGNOSIS — K5289 Other specified noninfective gastroenteritis and colitis: Secondary | ICD-10-CM

## 2013-05-22 DIAGNOSIS — R197 Diarrhea, unspecified: Secondary | ICD-10-CM

## 2013-05-22 DIAGNOSIS — I4891 Unspecified atrial fibrillation: Secondary | ICD-10-CM | POA: Diagnosis present

## 2013-05-22 DIAGNOSIS — R0602 Shortness of breath: Secondary | ICD-10-CM

## 2013-05-22 DIAGNOSIS — E860 Dehydration: Secondary | ICD-10-CM

## 2013-05-22 HISTORY — PX: CARDIOVERSION: SHX1299

## 2013-05-22 HISTORY — DX: Chronic diastolic (congestive) heart failure: I50.32

## 2013-05-22 LAB — MRSA PCR SCREENING: MRSA by PCR: NEGATIVE

## 2013-05-22 LAB — BASIC METABOLIC PANEL
BUN: 11 mg/dL (ref 6–23)
CALCIUM: 8.5 mg/dL (ref 8.4–10.5)
CHLORIDE: 106 meq/L (ref 96–112)
CO2: 20 meq/L (ref 19–32)
CREATININE: 0.93 mg/dL (ref 0.50–1.35)
GFR calc non Af Amer: 85 mL/min — ABNORMAL LOW (ref >90)
Glucose, Bld: 113 mg/dL — ABNORMAL HIGH (ref 70–99)
Potassium: 3.7 mEq/L (ref 3.7–5.3)
Sodium: 140 mEq/L (ref 137–147)

## 2013-05-22 LAB — CBC
HCT: 41.4 % (ref 39.0–52.0)
Hemoglobin: 14.7 g/dL (ref 13.0–17.0)
MCH: 30.2 pg (ref 26.0–34.0)
MCHC: 35.5 g/dL (ref 30.0–36.0)
MCV: 85.2 fL (ref 78.0–100.0)
PLATELETS: 200 10*3/uL (ref 150–400)
RBC: 4.86 MIL/uL (ref 4.22–5.81)
RDW: 13.8 % (ref 11.5–15.5)
WBC: 7 10*3/uL (ref 4.0–10.5)

## 2013-05-22 LAB — TSH: TSH: 2.609 u[IU]/mL (ref 0.350–4.500)

## 2013-05-22 LAB — CLOSTRIDIUM DIFFICILE BY PCR: Toxigenic C. Difficile by PCR: NEGATIVE

## 2013-05-22 SURGERY — CARDIOVERSION
Anesthesia: General

## 2013-05-22 MED ORDER — ACETAMINOPHEN 650 MG RE SUPP: 650.0000 mg | Freq: Four times a day (QID) | RECTAL | Status: DC | PRN

## 2013-05-22 MED ORDER — SODIUM CHLORIDE 0.9 % IJ SOLN
3.0000 mL | INTRAMUSCULAR | Status: DC | PRN
Start: 2013-05-22 — End: 2013-05-22

## 2013-05-22 MED ORDER — SODIUM CHLORIDE 0.9 % IV SOLN: 250.0000 mL | INTRAVENOUS | Status: DC | PRN

## 2013-05-22 MED ORDER — SODIUM CHLORIDE 0.9 % IJ SOLN
3.0000 mL | Freq: Two times a day (BID) | INTRAMUSCULAR | Status: DC
Start: 2013-05-22 — End: 2013-05-22

## 2013-05-22 MED ORDER — FERROUS SULFATE 325 (65 FE) MG PO TABS
325.0000 mg | ORAL_TABLET | Freq: Every day | ORAL | Status: DC
  Administered 2013-05-22: 325 mg via ORAL
  Filled 2013-05-22 (×2): qty 1

## 2013-05-22 MED ORDER — DILTIAZEM HCL ER COATED BEADS 120 MG PO CP24
120.0000 mg | ORAL_CAPSULE | Freq: Every day | ORAL | Status: DC
  Administered 2013-05-22: 120 mg via ORAL
  Filled 2013-05-22: qty 1

## 2013-05-22 MED ORDER — FOLIC ACID 1 MG PO TABS
1.0000 mg | ORAL_TABLET | Freq: Every day | ORAL | Status: DC
  Administered 2013-05-22: 1 mg via ORAL
  Filled 2013-05-22: qty 1

## 2013-05-22 MED ORDER — POTASSIUM CHLORIDE CRYS ER 20 MEQ PO TBCR
40.0000 meq | EXTENDED_RELEASE_TABLET | Freq: Once | ORAL | Status: AC
  Administered 2013-05-22: 40 meq via ORAL
  Filled 2013-05-22: qty 2

## 2013-05-22 MED ORDER — OXYCODONE HCL 5 MG PO TABS: 5.0000 mg | ORAL_TABLET | ORAL | Status: DC | PRN

## 2013-05-22 MED ORDER — LIDOCAINE HCL (CARDIAC) 20 MG/ML IV SOLN
INTRAVENOUS | Status: DC | PRN
  Administered 2013-05-22: 20 mg via INTRAVENOUS

## 2013-05-22 MED ORDER — BUDESONIDE-FORMOTEROL FUMARATE 160-4.5 MCG/ACT IN AERO
2.0000 | INHALATION_SPRAY | Freq: Two times a day (BID) | RESPIRATORY_TRACT | Status: DC
  Filled 2013-05-22: qty 6

## 2013-05-22 MED ORDER — SODIUM CHLORIDE 0.9 % IV SOLN
INTRAVENOUS | Status: DC
  Administered 2013-05-22: 03:00:00 via INTRAVENOUS

## 2013-05-22 MED ORDER — LORATADINE 10 MG PO TABS
10.0000 mg | ORAL_TABLET | Freq: Every day | ORAL | Status: DC
  Administered 2013-05-22: 10 mg via ORAL
  Filled 2013-05-22: qty 1

## 2013-05-22 MED ORDER — PROPOFOL 10 MG/ML IV BOLUS
INTRAVENOUS | Status: DC | PRN
  Administered 2013-05-22: 70 mg via INTRAVENOUS

## 2013-05-22 MED ORDER — ONDANSETRON HCL 4 MG/2ML IJ SOLN: 4.0000 mg | Freq: Four times a day (QID) | INTRAMUSCULAR | Status: DC | PRN

## 2013-05-22 MED ORDER — ALBUTEROL SULFATE HFA 108 (90 BASE) MCG/ACT IN AERS: 2.0000 | INHALATION_SPRAY | Freq: Four times a day (QID) | RESPIRATORY_TRACT | Status: DC | PRN

## 2013-05-22 MED ORDER — ALUM & MAG HYDROXIDE-SIMETH 200-200-20 MG/5ML PO SUSP: 30.0000 mL | Freq: Four times a day (QID) | ORAL | Status: DC | PRN

## 2013-05-22 MED ORDER — ACETAMINOPHEN 325 MG PO TABS: 650.0000 mg | ORAL_TABLET | Freq: Four times a day (QID) | ORAL | Status: DC | PRN

## 2013-05-22 MED ORDER — B COMPLEX-C PO TABS
1.0000 | ORAL_TABLET | Freq: Every day | ORAL | Status: DC
Start: 2013-05-22 — End: 2013-05-22
  Administered 2013-05-22: 1 via ORAL
  Filled 2013-05-22: qty 1

## 2013-05-22 MED ORDER — SODIUM CHLORIDE 0.9 % IV SOLN: INTRAVENOUS | Status: DC

## 2013-05-22 MED ORDER — FLECAINIDE ACETATE 100 MG PO TABS
300.0000 mg | ORAL_TABLET | ORAL | Status: DC | PRN
  Filled 2013-05-22: qty 3

## 2013-05-22 MED ORDER — DILTIAZEM HCL 100 MG IV SOLR: 5.0000 mg/h | INTRAVENOUS | Status: DC

## 2013-05-22 MED ORDER — HYDROMORPHONE HCL PF 1 MG/ML IJ SOLN: 0.5000 mg | INTRAMUSCULAR | Status: DC | PRN

## 2013-05-22 MED ORDER — SODIUM CHLORIDE 0.9 % IV SOLN
INTRAVENOUS | Status: AC
  Administered 2013-05-22: 08:00:00 via INTRAVENOUS

## 2013-05-22 MED ORDER — METOPROLOL TARTRATE 12.5 MG HALF TABLET
12.5000 mg | ORAL_TABLET | Freq: Two times a day (BID) | ORAL | Status: DC
Start: 2013-05-22 — End: 2013-05-22
  Administered 2013-05-22 (×2): 12.5 mg via ORAL
  Filled 2013-05-22 (×3): qty 1

## 2013-05-22 MED ORDER — ALBUTEROL SULFATE (2.5 MG/3ML) 0.083% IN NEBU: 2.5000 mg | INHALATION_SOLUTION | Freq: Four times a day (QID) | RESPIRATORY_TRACT | Status: DC | PRN

## 2013-05-22 MED ORDER — PANTOPRAZOLE SODIUM 40 MG PO TBEC
40.0000 mg | DELAYED_RELEASE_TABLET | Freq: Every day | ORAL | Status: DC
  Administered 2013-05-22: 40 mg via ORAL
  Filled 2013-05-22: qty 1

## 2013-05-22 MED ORDER — HYDROMORPHONE HCL PF 1 MG/ML IJ SOLN
0.5000 mg | INTRAMUSCULAR | Status: DC | PRN
Start: 2013-05-22 — End: 2013-05-22

## 2013-05-22 MED ORDER — ENOXAPARIN SODIUM 30 MG/0.3ML ~~LOC~~ SOLN: 30.0000 mg | SUBCUTANEOUS | Status: DC

## 2013-05-22 MED ORDER — ONDANSETRON HCL 4 MG PO TABS: 4.0000 mg | ORAL_TABLET | Freq: Four times a day (QID) | ORAL | Status: DC | PRN

## 2013-05-22 MED ORDER — MAGNESIUM OXIDE 400 (241.3 MG) MG PO TABS
400.0000 mg | ORAL_TABLET | Freq: Two times a day (BID) | ORAL | Status: DC
Start: 2013-05-22 — End: 2013-05-22
  Administered 2013-05-22 (×2): 400 mg via ORAL
  Filled 2013-05-22 (×3): qty 1

## 2013-05-22 MED ORDER — METHOTREXATE 2.5 MG PO TABS: 5.0000 mg | ORAL_TABLET | ORAL | Status: DC

## 2013-05-22 MED ORDER — RIVAROXABAN 20 MG PO TABS
20.0000 mg | ORAL_TABLET | Freq: Every day | ORAL | Status: DC
Start: 2013-05-22 — End: 2013-05-22
  Filled 2013-05-22: qty 1

## 2013-05-22 MED ORDER — SODIUM CHLORIDE 0.9 % IV SOLN
INTRAVENOUS | Status: DC | PRN
  Administered 2013-05-22: 13:00:00 via INTRAVENOUS

## 2013-05-22 NOTE — Progress Notes (Signed)
SUBJECTIVE: The patient is doing well today.  At this time, he denies chest pain, shortness of breath, or any new concerns.  He is feeling much improved this morning.  Nausea has resolved.   He has had GI illness since Tuesday.  He received 2 L of saline at PCP's office on Wednesday with labs and stool specimens sent to [facility] (no results back yet).  On Wednesday, HR was 70, so he feels that he was in SR then.  On Friday, GI symptoms began to resolve, but he felt fatigued and washed out (same symptoms as normally occur with atrial arrhythmias) - he checked his blood pressure, heart rate was elevated.  He took Flecainide 300mg  PO x 1 to try to convert to sinus rhythm but subsequently developed chest pain and so came to [facility] for further evaluation.    Last echo 2013- EF 60-65%, no RWMA, grade 2 diastolic dysfunction, LA 48  CURRENT MEDICATIONS: . B-complex with vitamin C  1 tablet Oral Daily  . budesonide-formoterol  2 puff Inhalation BID  . diltiazem  120 mg Oral Daily  . ferrous sulfate  325 mg Oral Q breakfast  . folic acid  1 mg Oral Daily  . loratadine  10 mg Oral Daily  . magnesium oxide  400 mg Oral BID  . [START ON 05/25/2013] methotrexate  5 mg Oral Q Tue  . metoprolol tartrate  12.5 mg Oral BID  . pantoprazole  40 mg Oral Daily  . potassium chloride  40 mEq Oral Once  . Rivaroxaban  20 mg Oral Q supper      OBJECTIVE: Physical Exam: Filed Vitals:   05/22/13 0634 05/22/13 0700 05/22/13 0800 05/22/13 0900  BP:  94/62 117/77 134/65  Pulse: 71 70 48 52  Temp:   98.1 F (36.7 C)   TempSrc:   Oral   Resp: 19 18 19 24   Height:      Weight:      SpO2: 97% 98% 97% 94%    Intake/Output Summary (Last 24 hours) at 05/22/13 1152 Last data filed at 05/22/13 0600  Gross per 24 hour  Intake  541.5 ml  Output      0 ml  Net  541.5 ml    Telemetry reveals atypical atrial flutter with variable ventricular response, V rates 70-100  GEN- The patient is well appearing, alert and  oriented x 3 today.   Head- normocephalic, atraumatic Eyes-  Sclera clear, conjunctiva pink Ears- hearing intact Oropharynx- clear Neck- supple, no JVP Lymph- no cervical lymphadenopathy Lungs- Clear to ausculation bilaterally, normal work of breathing Heart- irregular rate and rhythm, no murmurs, rubs or gallops, PMI not laterally displaced GI- soft, NT, ND, + BS Extremities- no clubbing, cyanosis, or edema Skin- no rash or lesion Psych- euthymic mood, full affect Neuro- strength and sensation are intact  LABS: Basic Metabolic Panel:  Recent Labs  05/21/13 2016 05/22/13 0302  NA 133* 140  K 3.6* 3.7  CL 98 106  CO2 14* 20  GLUCOSE 149* 113*  BUN 14 11  CREATININE 1.04 0.93  CALCIUM 9.2 8.5   Liver Function Tests:  Recent Labs  05/21/13 2016  AST 49*  ALT 45  ALKPHOS 84  BILITOT 0.8  PROT 8.1  ALBUMIN 4.1   CBC:  Recent Labs  05/21/13 2016 05/22/13 0302  WBC 8.0 7.0  HGB 17.1* 14.7  HCT 47.2 41.4  MCV 84.4 85.2  PLT 224 200    RADIOLOGY: Dg Chest [facility]  1 View 05/21/2013   CLINICAL DATA:  Chest pain, shortness of breath.  EXAM: PORTABLE CHEST - 1 VIEW  COMPARISON:  October 22, 2011.  FINDINGS: The heart size and mediastinal contours are within normal limits. Both lungs are clear. No pleural effusion or pneumothorax is noted. The visualized skeletal structures are unremarkable.  IMPRESSION: No acute cardiopulmonary abnormality seen.   Electronically Signed   By: Sabino Dick M.D.   On: 05/21/2013 20:44    ASSESSMENT AND PLAN:  Principal Problem:   Diarrhea Active Problems:   Chronic anticoagulation   Acute gastroenteritis   Atrial fibrillation with RVR   SOB (shortness of breath)   Dehydration   Diastolic CHF, chronic  1. Atypical atrial flutter New onset and due to GI illness Treatment options including cardioversion were discussed at length.  He is chronically anticoagulated with xarelto.  Though he did miss 2 doses earlier in the week, his atrial  arrhythmia is < 48 hours in duration and he has resumed xarelto.  I therefore feel that it is reasonable to proceed with cardioversion. Will proceed with cardioversion later today.  Resume xarelto  2. GI illness Per primary team  Could like discharge later today from a cardiology standpoint

## 2013-05-22 NOTE — Anesthesia Preprocedure Evaluation (Addendum)
Anesthesia Evaluation  Patient identified by MRN, date of birth, ID band Patient awake    Reviewed: Allergy & Precautions, H&P , NPO status , Patient's Chart, lab work & pertinent test results  History of Anesthesia Complications Negative for: history of anesthetic complications  Airway Mallampati: II TM Distance: >3 FB Neck ROM: Full    Dental  (+) Dental Advisory Given, Teeth Intact, Caps   Pulmonary shortness of breath and with exertion, asthma , COPD COPD inhaler, former smoker (quit '03),  breath sounds clear to auscultation  Pulmonary exam normal       Cardiovascular hypertension, Pt. on medications and Pt. on home beta blockers + dysrhythmias (persistent Afib/flutter despite DCCV and ablation) Atrial Fibrillation Rhythm:Irregular Rate:Tachycardia  '13 ECHO: EF 32-99%, Grade 2 diastolic dysfunction, valves OK '05 cath: normal coronaries   Neuro/Psych negative neurological ROS     GI/Hepatic Neg liver ROS, GERD-  Medicated,  Endo/Other  Morbid obesity  Renal/GU negative Renal ROS     Musculoskeletal   Abdominal (+) + obese,   Peds  Hematology  (+) Blood dyscrasia (Xarelto), ,   Anesthesia Other Findings   Reproductive/Obstetrics                         Anesthesia Physical Anesthesia Plan  ASA: III  Anesthesia Plan: General   Post-op Pain Management:    Induction: Intravenous  Airway Management Planned: Mask  Additional Equipment:   Intra-op Plan:   Post-operative Plan:   Informed Consent: I have reviewed the patients History and Physical, chart, labs and discussed the procedure including the risks, benefits and alternatives for the proposed anesthesia with the patient or authorized representative who has indicated his/her understanding and acceptance.   Dental advisory given  Plan Discussed with: Surgeon and CRNA  Anesthesia Plan Comments: (Plan routine monitors, GA for  cardioversion)       Anesthesia Quick Evaluation

## 2013-05-22 NOTE — Progress Notes (Signed)
Doing well s/p cardioversion Would DC to home today Adequate hydration encouraged Compliance with xarelto encouraged  He should keep his scheduled follow-up with me.

## 2013-05-22 NOTE — H&P (Addendum)
[facility] History and Physical  Kenneth [name] [id number] DOB: [birth year] DOA: 05/21/2013  Referring physician:  EDP PCP: Unice Cobble, MD  Specialists:   Chief Complaint:  Diarrhea and Nausea and Vomiting  HPI: Kenneth [name] is a 69 y.o. male with multiple medical problems including Paroxysmal Atrial Fibrillation, COPD, who presents to the ED with complaints of diarrhea and nausea and vomiting for the past 4 days.  He reports passing 10-15 stools daily of a foul smelling watery stool.  He denies having any fevers or chills, and he also denies having any hematemesis, or melena passage or hematochezia.   He also reports having chest pain and SOB and palpitations and DOE for the past 2 days.      Review of Systems:  Constitutional: No Weight Loss, No Weight Gain, Night Sweats, Fevers, Chills, Fatigue, or +Generalized Weakness HEENT: No Headaches, Difficulty Swallowing,Tooth/Dental Problems,Sore Throat,  No Sneezing, Rhinitis, Ear Ache, Nasal Congestion, or Post Nasal Drip,  Cardio-vascular:  +Chest pain, Orthopnea, PND, Edema in lower extremities, Anasarca, Dizziness, Palpitations  Resp: No Dyspnea,+DOE, No Productive Cough, No Non-Productive Cough, No Hemoptysis, No Change in Color of Mucus,  No Wheezing.    GI: No Heartburn, Indigestion, Abdominal Pain, +Nausea, Vomiting, Diarrhea, +Loss of Appetite  GU: No Dysuria, Change in Color of Urine, No Urgency or Frequency.  No flank pain.  Musculoskeletal: No Joint Pain or Swelling.  No Decreased Range of Motion. No Back Pain.  Neurologic: No Syncope, No Seizures, Muscle Weakness, Paresthesia, Vision Disturbance or Loss, No Diplopia, No Vertigo, No Difficulty Walking,  Skin: No Rash or Lesions. Psych: No Change in Mood or Affect. No Depression or Anxiety. No Memory loss. No Confusion or Hallucinations   Past Medical History  Diagnosis Date  . Paroxysmal atrial fibrillation     a. 11/2003 Tikosyn initiated - subsequently d/c'd;   b. 2006 RFCA for Afib @ [facility];  c. 09/2011 Echo: EF 60-65%, Gr 2 DD;  d. 10/2011 DCCV  . Psoriasis   . Dyspepsia   . Deafness   . Kidney stones   . Hyperlipidemia   . Macular degeneration     pt reports that he actually has a "retina problem' not MD  . Pneumonia 2009    in context of Flu  . Atypical chest pain     a. 06/2003 Cath:  nl cors;  b. 11/2006 MV: no ischemia, nl LV  . Cataract   . Cancer     skin ca   . COPD (chronic obstructive pulmonary disease)       Past Surgical History  Procedure Laterality Date  . Rotator cuff repair    . Atrial fibrillation ablation  2006    Dr Rolland Porter at [facility]  . Eye surgery    . Appendectomy    . Tonsillectomy         Prior to Admission medications   Medication Sig Start Date End Date Taking? Authorizing Provider  b complex vitamins capsule Take 1 capsule by mouth daily.   Yes Historical Provider, MD  budesonide-formoterol (SYMBICORT) 160-4.5 MCG/ACT inhaler Inhale 2 puffs into the lungs as directed. 05/03/13  Yes [name], MD  cetirizine (ZYRTEC) 10 MG tablet Take 10 mg by mouth daily.     Yes Historical Provider, MD  diltiazem (CARDIZEM CD) 120 MG 24 hr capsule Take 1 capsule (120 mg total) by mouth daily. 04/28/12  Yes Rogelia Mire, NP  etanercept (ENBREL) 25 MG injection Inject 50 mg into the skin  every Saturday.    Yes Historical Provider, MD  ferrous sulfate 325 (65 FE) MG tablet Take 325 mg by mouth daily with breakfast.   Yes Historical Provider, MD  flecainide (TAMBOCOR) 100 MG tablet Take 300 mg by mouth as needed (for SVT).  03/25/13  Yes Historical Provider, MD  folic acid (FOLVITE) 1 MG tablet Take 1 mg by mouth daily.   Yes Historical Provider, MD  Magnesium 400 MG CAPS Take 400 mg by mouth 2 (two) times daily.    Yes Historical Provider, MD  methotrexate (RHEUMATREX) 2.5 MG tablet Take 5 mg by mouth every Tuesday. Caution:Chemotherapy. Protect from light.   Yes Historical Provider, MD  metoprolol tartrate (LOPRESSOR)  25 MG tablet Take 0.5 tablets (12.5 mg total) by mouth 2 (two) times daily. 04/28/12  Yes Rogelia Mire, NP  omeprazole (PRILOSEC) 20 MG capsule Take 20 mg by mouth 2 (two) times daily before a meal.   Yes Historical Provider, MD  Rivaroxaban (XARELTO) 20 MG TABS tablet Take 1 tablet (20 mg total) by mouth daily. 04/14/13  Yes [name], MD  zolpidem (AMBIEN) 10 MG tablet Take 10 mg by mouth at bedtime as needed.   Yes Historical Provider, MD  albuterol (PROVENTIL HFA;VENTOLIN HFA) 108 (90 BASE) MCG/ACT inhaler Inhale into the lungs every 6 (six) hours as needed for wheezing or shortness of breath.    Historical Provider, MD      Allergies  Allergen Reactions  . Decongestant [Pseudoephedrine Hcl Er] Palpitations and Other (See Comments)  . Pseudoephedrine Palpitations     Social History:  reports that he quit smoking about 11 years ago. His smoking use included Cigars. He quit smokeless tobacco use about 11 years ago. He reports that he does not drink alcohol or use illicit drugs.     Family History  Problem Relation Age of Onset  . Coronary artery disease Mother   . Cancer Mother     BREAST,COLON  . Heart disease Maternal Grandmother     MI  . Stroke Paternal Grandfather     CVA  . Coronary artery disease Paternal Grandfather        Physical Exam:  GEN:  Pleasant Elderly Obese  68 y.o. Caucasian male  examined  and in no acute distress; cooperative with exam Filed Vitals:   05/22/13 0300 05/22/13 0331 05/22/13 0400 05/22/13 0500  BP: 142/84  158/81 107/75  Pulse: 72  140 73  Temp:  98.3 F (36.8 C)    TempSrc:  Oral    Resp: 32  23 20  Height:      Weight:      SpO2: 98%  97% 97%   Blood pressure 107/75, pulse 73, temperature 98.3 F (36.8 C), temperature source Oral, resp. rate 20, height 6\' 3"  (1.905 m), weight 114.6 kg (252 lb 10.4 oz), SpO2 97.00%. PSYCH: He is alert and oriented x4; does not appear anxious does not appear depressed; affect is  normal HEENT: Normocephalic and Atraumatic, Mucous membranes pink; PERRLA; EOM intact; Fundi:  Benign;  No scleral icterus, Nares: Patent, Oropharynx: Clear, Fair Dentition, Neck:  FROM, no cervical lymphadenopathy nor thyromegaly or carotid bruit; no JVD; Breasts:: Not examined CHEST WALL: No tenderness CHEST: Normal respiration, clear to auscultation bilaterally HEART: Regular rate and rhythm; no murmurs rubs or gallops BACK: No kyphosis or scoliosis; no CVA tenderness ABDOMEN: Positive Bowel Sounds, Obese, soft non-tender; no masses, no organomegaly. Rectal Exam: Not done EXTREMITIES: No cyanosis, clubbing or edema; no ulcerations.  Genitalia: not examined PULSES: 2+ and symmetric SKIN: Normal hydration no rash or ulceration CNS:  A XO X 3,  Nonfocal  Vascular: pulses palpable throughout    Labs on Admission:  Basic Metabolic Panel:  Recent Labs Lab 05/21/13 2016 05/22/13 0302  NA 133* 140  K 3.6* 3.7  CL 98 106  CO2 14* 20  GLUCOSE 149* 113*  BUN 14 11  CREATININE 1.04 0.93  CALCIUM 9.2 8.5   Liver Function Tests:  Recent Labs Lab 05/21/13 2016  AST 49*  ALT 45  ALKPHOS 84  BILITOT 0.8  PROT 8.1  ALBUMIN 4.1   No results found for this basename: LIPASE, AMYLASE,  in the last 168 hours No results found for this basename: AMMONIA,  in the last 168 hours CBC:  Recent Labs Lab 05/21/13 2016 05/22/13 0302  WBC 8.0 7.0  HGB 17.1* 14.7  HCT 47.2 41.4  MCV 84.4 85.2  PLT 224 200   Cardiac Enzymes: No results found for this basename: CKTOTAL, CKMB, CKMBINDEX, TROPONINI,  in the last 168 hours  BNP (last 3 results) No results found for this basename: PROBNP,  in the last 8760 hours CBG: No results found for this basename: GLUCAP,  in the last 168 hours  Radiological Exams on Admission: Dg Chest Port 1 View  05/21/2013   CLINICAL DATA:  Chest pain, shortness of breath.  EXAM: PORTABLE CHEST - 1 VIEW  COMPARISON:  October 22, 2011.  FINDINGS: The heart size  and mediastinal contours are within normal limits. Both lungs are clear. No pleural effusion or pneumothorax is noted. The visualized skeletal structures are unremarkable.  IMPRESSION: No acute cardiopulmonary abnormality seen.   Electronically Signed   By: Sabino Dick M.D.   On: 05/21/2013 20:44      EKG: Independently reviewed. Atrial Fibrillation rate 108    Assessment/Plan:   68 y.o. male with  Principal Problem:   Diarrhea Active Problems:   Acute gastroenteritis   Dehydration   Chronic anticoagulation   Atrial fibrillation with RVR   SOB (shortness of breath)   Diastolic CHF, chronic   Chest Pain    Metabolic Acidosis   1.  Diarrhea/acute Gastroenteritis-   Stool sent for C.diff and Culture, and placed on Entgeric Precautions.   Gentle Rehydration, and Begin ABx rx if + for C.Diff Infection.      2.  Dehydration - due to #1,  IVFs for Rehydration.  Monitor for S/Sxs of Fluid OVerload due to CHF.    3.  Atrial fibrillation with RVR- placed on IV Cardizem drip, and continue Flecainide Rx and Xarelto Rx.    Seen By Cards Fellow in ED.     4.  Chest pain -  Monitor Cardiac Rhythm, and Cycle Troponins, pain most likely due to #3.    5.  Metabolic acidosis - due to #1.  Continue Gentle rehydration.     6.  Chronic Diastolic CHF-  Monitor for S/Sxs of Fluid overload.     7.   Metabolic Acidosis-   Lactate level = 3.  .  Should improve with gentle rehydration.     8.   Chronic Anti-coagulation-   Continue Xaerelto Rx.              Code Status:     FULL CODE Family Communication:    Wife at Bedside Disposition Plan:       Inpatient   Time spent:  [place] [employer] operational officer  [phone]  If 7PM-7AM, please contact night-coverage [email or web address] Password [facility] 05/22/2013, 5:47 AM

## 2013-05-22 NOTE — Progress Notes (Signed)
Patient Demographics  Kenneth [name], is a 68 y.o. male, DOB - 10-01-1945, XIP:[id number]  Admit date - 05/21/2013   Admitting Physician Theressa Millard, MD  Outpatient Primary MD for the patient is Unice Cobble, MD  LOS - 1   Chief Complaint  Patient presents with  . Tachycardia  . Diarrhea        Assessment & Plan    1. Diarrhea/acute Gastroenteritis- Stool sent for C.diff and Culture, and placed on Entgeric Precautions. Gentle Rehydration, and Begin ABx Rx if + for C.Diff Infection. Clinically much better advance diet.    2. Dehydration - due to #1, admitted with IV fluids, appears to be stable and at baseline, place on full liquid diet and stop IV fluids.   3. Atrial fibrillation with RVR- placed on IV Cardizem drip titrate of leave on oral Cardizem his rate is better, and continue Flecainide Rx and Xarelto Rx. Seen By Cards Fellow in ED. We will defer management of this problem to cardiology.    4. Chest pain - Monitor Cardiac Rhythm, and Cycle Troponins, pain most likely due to #3.     5. Metabolic acidosis - due to #1. Continue Gentle rehydration.     6. Chronic Diastolic CHF- Monitor for S/Sxs of Fluid overload. EF 60-65% with grade 2 chronic diastolic CHF on echogram done in June of 2013. Compensated from the standpoint.     7. Chronic Anti-coagulation- Continue Xaerelto Rx.        Code Status: full  Family Communication: wife  Disposition Plan: home   Procedures    Consults  Cards   Medications  Scheduled Meds: . B-complex with vitamin C  1 tablet Oral Daily  . budesonide-formoterol  2 puff Inhalation BID  . diltiazem  120 mg Oral Daily  . ferrous sulfate  325 mg Oral Q breakfast  . folic acid  1 mg Oral Daily  . loratadine  10 mg Oral Daily    . magnesium oxide  400 mg Oral BID  . [START ON 05/25/2013] methotrexate  5 mg Oral Q Tue  . metoprolol tartrate  12.5 mg Oral BID  . pantoprazole  40 mg Oral Daily  . potassium chloride  40 mEq Oral Once  . Rivaroxaban  20 mg Oral Q supper   Continuous Infusions: . diltiazem (CARDIZEM) infusion Stopped (05/22/13 0620)   PRN Meds:.acetaminophen, albuterol, alum & mag hydroxide-simeth, flecainide, HYDROmorphone (DILAUDID) injection, ondansetron (ZOFRAN) IV, oxyCODONE  DVT Prophylaxis  XARALTO  Lab Results  Component Value Date   PLT 200 05/22/2013    Antibiotics   Anti-infectives   None          Subjective:   Kenneth [name] today has, No headache, No chest pain, No abdominal pain - No Nausea, No new weakness tingling or numbness, No Cough - SOB.   Objective:   Filed Vitals:   05/22/13 0634 05/22/13 0700 05/22/13 0800 05/22/13 0900  BP:  94/62 117/77 134/65  Pulse: 71 70 48 52  Temp:   98.1 F (36.7 C)   TempSrc:   Oral   Resp: 19 18 19 24   Height:      Weight:      SpO2: 97% 98% 97% 94%    Wt  Readings from Last 3 Encounters:  05/22/13 114.6 kg (252 lb 10.4 oz)  05/03/13 121.564 kg (268 lb)  06/10/12 117.482 kg (259 lb)     Intake/Output Summary (Last 24 hours) at 05/22/13 0945 Last data filed at 05/22/13 0600  Gross per 24 hour  Intake  541.5 ml  Output      0 ml  Net  541.5 ml     Physical Exam  Awake Alert, Oriented X 3, No new F.N deficits, Normal affect [place].AT,PERRAL Supple Neck,No JVD, No cervical lymphadenopathy appriciated.  Symmetrical Chest wall movement, Good air movement bilaterally, CTAB RRR,No Gallops,Rubs or new Murmurs, No Parasternal Heave +ve B.Sounds, Abd Soft, Non tender, No organomegaly appriciated, No rebound - guarding or rigidity. No Cyanosis, Clubbing or edema, No new Rash or bruise     Data Review   Micro Results Recent Results (from the past 240 hour(s))  MRSA PCR SCREENING     Status: None   Collection Time     05/22/13  2:33 AM      Result Value Ref Range Status   MRSA by PCR NEGATIVE  NEGATIVE Final   Comment:            The GeneXpert MRSA Assay (FDA     approved for NASAL specimens     only), is one component of a     comprehensive MRSA colonization     surveillance program. It is not     intended to diagnose MRSA     infection nor to guide or     monitor treatment for     MRSA infections.    Radiology Reports Dg Chest Port 1 View  05/21/2013   CLINICAL DATA:  Chest pain, shortness of breath.  EXAM: PORTABLE CHEST - 1 VIEW  COMPARISON:  October 22, 2011.  FINDINGS: The heart size and mediastinal contours are within normal limits. Both lungs are clear. No pleural effusion or pneumothorax is noted. The visualized skeletal structures are unremarkable.  IMPRESSION: No acute cardiopulmonary abnormality seen.   Electronically Signed   By: Sabino Dick M.D.   On: 05/21/2013 20:44    CBC  Recent Labs Lab 05/21/13 2016 05/22/13 0302  WBC 8.0 7.0  HGB 17.1* 14.7  HCT 47.2 41.4  PLT 224 200  MCV 84.4 85.2  MCH 30.6 30.2  MCHC 36.2* 35.5  RDW 13.7 13.8    Chemistries   Recent Labs Lab 05/21/13 2016 05/22/13 0302  NA 133* 140  K 3.6* 3.7  CL 98 106  CO2 14* 20  GLUCOSE 149* 113*  BUN 14 11  CREATININE 1.04 0.93  CALCIUM 9.2 8.5  AST 49*  --   ALT 45  --   ALKPHOS 84  --   BILITOT 0.8  --    ------------------------------------------------------------------------------------------------------------------ estimated creatinine clearance is 105.2 ml/min (by C-G formula based on Cr of 0.93). ------------------------------------------------------------------------------------------------------------------ No results found for this basename: HGBA1C,  in the last 72 hours ------------------------------------------------------------------------------------------------------------------ No results found for this basename: CHOL, HDL, LDLCALC, TRIG, CHOLHDL, LDLDIRECT,  in the last 72  hours ------------------------------------------------------------------------------------------------------------------ No results found for this basename: TSH, T4TOTAL, FREET3, T3FREE, THYROIDAB,  in the last 72 hours ------------------------------------------------------------------------------------------------------------------ No results found for this basename: VITAMINB12, FOLATE, FERRITIN, TIBC, IRON, RETICCTPCT,  in the last 72 hours  Coagulation profile No results found for this basename: INR, PROTIME,  in the last 168 hours  No results found for this basename: DDIMER,  in the last 72 hours  Cardiac Enzymes No  results found for this basename: CK, CKMB, TROPONINI, MYOGLOBIN,  in the last 168 hours ------------------------------------------------------------------------------------------------------------------ No components found with this basename: POCBNP,      Time Spent in minutes  35   Lala Lund K M.D on 05/22/2013 at 9:45 AM  Between 7am to 7pm - Pager - [phone]  After 7pm go to [email or web address] - password [email or web address]  And look for the night coverage person covering for me after hours  [facility] Office  [phone]

## 2013-05-22 NOTE — Preoperative (Signed)
Beta Blockers   Reason not to administer Beta Blockers:Not Applicable, patient received Metoprolol at 0942 on 05/22/13.

## 2013-05-22 NOTE — ED Notes (Signed)
Jenkins, MD at bedside.  

## 2013-05-22 NOTE — Anesthesia Procedure Notes (Signed)
Date/Time: 05/22/2013 1:12 PM Performed by: Blair Heys E Pre-anesthesia Checklist: Patient identified, Emergency Drugs available, Suction available, Patient being monitored and Timeout performed Patient Re-evaluated:Patient Re-evaluated prior to inductionOxygen Delivery Method: Ambu bag Intubation Type: IV induction Ventilation: Mask ventilation without difficulty Dental Injury: Teeth and Oropharynx as per pre-operative assessment

## 2013-05-22 NOTE — Progress Notes (Signed)
Utilization Review Completed.  

## 2013-05-22 NOTE — Discharge Summary (Signed)
Kenneth [name], is a 68 y.o. male  DOB 1946/02/16  MRN [id number].  Admission date:  05/21/2013  Admitting Physician  Theressa Millard, MD  Discharge Date:  05/22/2013   Primary MD  Unice Cobble, MD  Recommendations for primary care physician for things to follow:   Monitor BMP and final stool culture results next visit.   Admission Diagnosis  Diarrhea [787.91] Dehydration [276.51] Atrial fibrillation with RVR [427.31]   Discharge Diagnosis  Diarrhea [787.91] Dehydration [276.51] Atrial fibrillation with RVR [427.31]   Gastroenteritis likely viral  Principal Problem:   Diarrhea Active Problems:   Chronic anticoagulation   Acute gastroenteritis   Atrial fibrillation with RVR   SOB (shortness of breath)   Dehydration   Diastolic CHF, chronic      Past Medical History  Diagnosis Date  . Paroxysmal atrial fibrillation     a. 11/2003 Tikosyn initiated - subsequently d/c'd;  b. 2006 RFCA for Afib @ [facility];  c. 09/2011 Echo: EF 60-65%, Gr 2 DD;  d. 10/2011 DCCV  . Psoriasis   . Dyspepsia   . Deafness   . Kidney stones   . Hyperlipidemia   . Macular degeneration     pt reports that he actually has a "retina problem' not MD  . Pneumonia 2009    in context of Flu  . Atypical chest pain     a. 06/2003 Cath:  nl cors;  b. 11/2006 MV: no ischemia, nl LV  . Cataract   . Cancer     skin ca   . COPD (chronic obstructive pulmonary disease)     Past Surgical History  Procedure Laterality Date  . Rotator cuff repair    . Atrial fibrillation ablation  2006    Dr Rolland Porter at [facility]  . Eye surgery    . Appendectomy    . Tonsillectomy       Discharge Condition: stable   Follow UP  Follow-up Information   Follow up with Unice Cobble, MD. Schedule an appointment as soon as possible for a visit in 3 days.   Specialty:   Internal Medicine   Contact information:   [address] [address] [phone]       Follow up with [name], MD. Schedule an appointment as soon as possible for a visit in 1 week.   Specialty:  Cardiology   Contact information:   [place] [address] [phone]         Discharge Instructions  and  Discharge Medications      Discharge Orders   Future Orders Complete By Expires   Diet - low sodium heart healthy  As directed    Discharge instructions  As directed    Comments:     Follow with Primary MD Unice Cobble, MD in 3 days, follow final stool culture results primary care physician next visit  Get CBC, CMP, checked 3 days by Primary MD and again as instructed by your Primary MD.  Activity: As tolerated with Full fall precautions use walker/cane & assistance as needed   Disposition Home    Diet: Heart Healthy   For Heart failure patients - Check your Weight same time everyday, if you gain over 2 pounds, or you develop in leg swelling, experience more shortness of breath or chest pain, call your Primary MD immediately. Follow Cardiac Low Salt Diet and 1.8 lit/day fluid restriction.   On your next visit with her primary care physician please Get Medicines reviewed and adjusted.  Please request your Prim.MD to go over all Hospital Tests and Procedure/Radiological results at the follow up, please get all Hospital records sent to your Prim MD by signing hospital release before you go home.   If you experience worsening of your admission symptoms, develop shortness of breath, life threatening emergency, suicidal or homicidal thoughts you must seek medical attention immediately by calling 911 or calling your MD immediately  if symptoms less severe.  You Must read complete instructions/literature along with all the possible adverse reactions/side effects for all the Medicines you take and that have been prescribed to you.  Take any new Medicines after you have completely understood and accpet all the possible adverse reactions/side effects.   Do not drive and provide baby sitting services if your were admitted for syncope or siezures until you have seen by Primary MD or a Neurologist and advised to do so again.  Do not drive when taking Pain medications.    Do not take more than prescribed Pain, Sleep and Anxiety Medications  Special Instructions: If you have smoked or chewed Tobacco  in the last 2 yrs please stop smoking, stop any regular Alcohol  and or any Recreational drug use.  Wear Seat belts while driving.   Please note  You were cared for by a hospitalist during your hospital stay. If you have any questions about your discharge medications or the care you received while you were in the hospital after you are discharged, you can call the unit and asked to speak with the hospitalist on call if the hospitalist that took care of you is not available. Once you are discharged, your primary care physician will handle any further medical issues. Please note that NO REFILLS for any discharge medications will be authorized once you are discharged, as it is imperative that you return to your primary care physician (or establish a relationship with a primary care physician if you do not have one) for your aftercare needs so that they can reassess your need for medications and monitor your lab values.   Increase activity slowly  As directed        Medication List         albuterol 108 (90 BASE) MCG/ACT inhaler  Commonly known as:  PROVENTIL HFA;VENTOLIN HFA  Inhale into the lungs every 6 (six) hours as needed for wheezing or shortness of breath.     b complex vitamins capsule  Take 1 capsule by mouth daily.     budesonide-formoterol 160-4.5 MCG/ACT inhaler  Commonly known as:  SYMBICORT  Inhale 2 puffs into the lungs as directed.     cetirizine 10 MG tablet  Commonly known as:  ZYRTEC  Take 10 mg by  mouth daily.     diltiazem 120 MG 24 hr capsule  Commonly known as:  CARDIZEM CD  Take 1 capsule (120 mg total) by mouth daily.     etanercept 25 MG injection  Commonly known as:  ENBREL  Inject  50 mg into the skin every Saturday.     ferrous sulfate 325 (65 FE) MG tablet  Take 325 mg by mouth daily with breakfast.     flecainide 100 MG tablet  Commonly known as:  TAMBOCOR  Take 300 mg by mouth as needed (for SVT).     folic acid 1 MG tablet  Commonly known as:  FOLVITE  Take 1 mg by mouth daily.     Magnesium 400 MG Caps  Take 400 mg by mouth 2 (two) times daily.     methotrexate 2.5 MG tablet  Commonly known as:  RHEUMATREX  Take 5 mg by mouth every Tuesday. Caution:Chemotherapy. Protect from light.     metoprolol tartrate 25 MG tablet  Commonly known as:  LOPRESSOR  Take 0.5 tablets (12.5 mg total) by mouth 2 (two) times daily.     omeprazole 20 MG capsule  Commonly known as:  PRILOSEC  Take 20 mg by mouth 2 (two) times daily before a meal.     Rivaroxaban 20 MG Tabs tablet  Commonly known as:  XARELTO  Take 1 tablet (20 mg total) by mouth daily.     zolpidem 10 MG tablet  Commonly known as:  AMBIEN  Take 10 mg by mouth at bedtime as needed.          Diet and Activity recommendation: See Discharge Instructions above   Consults obtained - cardiology   Major procedures and Radiology Reports - PLEASE review detailed and final reports for all details, in brief -   Cardioversion by Dr. Rayann Heman   Dg Chest Port 1 View  05/21/2013   CLINICAL DATA:  Chest pain, shortness of breath.  EXAM: PORTABLE CHEST - 1 VIEW  COMPARISON:  October 22, 2011.  FINDINGS: The heart size and mediastinal contours are within normal limits. Both lungs are clear. No pleural effusion or pneumothorax is noted. The visualized skeletal structures are unremarkable.  IMPRESSION: No acute cardiopulmonary abnormality seen.   Electronically Signed   By: Sabino Dick M.D.   On: 05/21/2013 20:44     Micro Results      Recent Results (from the past 240 hour(s))  CLOSTRIDIUM DIFFICILE BY PCR     Status: None   Collection Time    05/21/13 10:50 PM      Result Value Ref Range Status   C difficile by pcr NEGATIVE  NEGATIVE Final  MRSA PCR SCREENING     Status: None   Collection Time    05/22/13  2:33 AM      Result Value Ref Range Status   MRSA by PCR NEGATIVE  NEGATIVE Final   Comment:            The GeneXpert MRSA Assay (FDA     approved for NASAL specimens     only), is one component of a     comprehensive MRSA colonization     surveillance program. It is not     intended to diagnose MRSA     infection nor to guide or     monitor treatment for     MRSA infections.     History of present illness and  Hospital Course:     Kindly see H&P for history of present illness and admission details, please review complete Labs, Consult reports and Test reports for all details in brief MAVEN CHALMERS, is a 68 y.o. male, patient with history of   COPD, paroxysmal atrial fibrillation on xaralto and Cardizem, psoriasis,  hearing loss wears hearing aid, dyslipidemia, macular degeneration who presented to the hospital after having food in a [employer] and then developing nausea vomiting and diarrhea, this happened a few days ago, he presented to the hospital where he was found to be slightly dehydrated and in atrial fibrillation with rapid ventricular rate.   He was treated with IV Cardizem, IV fluids and bowel rest, he tested negative for C. difficile, his GI symptoms have completely resolved and he is tolerating diet without any problems, he was seen by Dr. Rayann Heman his primary cardiologist and he underwent cardioversion bedside without any problems, he's currently in sinus, he will resume all his home medications unchanged. Have requested him to follow with primary care physician in the next 3 days and get his vitals checked along with a repeat CBC BMP and the final stool culture  results. His gastroenteritis was most likely viral. We'll resume his Cardizem and xaralto unchanged.   Other chronic medical problems which include COPD, hearing loss, psoriasis, dyslipidemia, macular degeneration were all stable and no changes were made to his chronic home medications whatsoever.     Today   Subjective:   Cletis Clack today has no headache,no chest abdominal pain,no new weakness tingling or numbness, feels much better wants to go home today.   Objective:   Blood pressure 120/76, pulse 52, temperature 98.1 F (36.7 C), temperature source Oral, resp. rate 18, height 6\' 3"  (1.905 m), weight 114.6 kg (252 lb 10.4 oz), SpO2 99.00%.   Intake/Output Summary (Last 24 hours) at 05/22/13 1355 Last data filed at 05/22/13 1321  Gross per 24 hour  Intake  591.5 ml  Output      0 ml  Net  591.5 ml    Exam Awake Alert, Oriented *3, No new F.N deficits, Normal affect [place].AT,PERRAL Supple Neck,No JVD, No cervical lymphadenopathy appriciated.  Symmetrical Chest wall movement, Good air movement bilaterally, CTAB RRR,No Gallops,Rubs or new Murmurs, No Parasternal Heave +ve B.Sounds, Abd Soft, Non tender, No organomegaly appriciated, No rebound -guarding or rigidity. No Cyanosis, Clubbing or edema, No new Rash or bruise  Data Review   CBC w Diff: Lab Results  Component Value Date   WBC 7.0 05/22/2013   HGB 14.7 05/22/2013   HCT 41.4 05/22/2013   PLT 200 05/22/2013   LYMPHOPCT 23 03/25/2013   MONOPCT 7 03/25/2013   EOSPCT 1 03/25/2013   BASOPCT 1 03/25/2013    CMP: Lab Results  Component Value Date   NA 140 05/22/2013   K 3.7 05/22/2013   CL 106 05/22/2013   CO2 20 05/22/2013   BUN 11 05/22/2013   CREATININE 0.93 05/22/2013   PROT 8.1 05/21/2013   ALBUMIN 4.1 05/21/2013   BILITOT 0.8 05/21/2013   ALKPHOS 84 05/21/2013   AST 49* 05/21/2013   ALT 45 05/21/2013  .   Total Time in preparing paper work, data evaluation and todays exam - 35 minutes  Thurnell Lose M.D on  05/22/2013 at [place]  [phone]

## 2013-05-22 NOTE — CV Procedure (Signed)
EP procedure Note   Pre procedure Diagnosis:  Persistent Atrial flutter (atypical) Post procedure Diagnosis:  Same  Procedures:  Electrical cardioversion  Description:  Informed, written consent was obtained for cardioversion.  Adequate IV acces and airway support were assured.  The patient was adequately sedated with intravenous propofol as outlined in the anesthesia report.  The patient presented today in atrial fibrillation.  She was successfully cardioverted to sinus rhythm with a single synchronized biphasic 200J shock delivered with cardioversion electrodes placed in the anterior/posterior configuration.  She remains in sinus rhythm thereafter.  There were no early apparent complications.  Conclusions:  1.  Successful cardioversion of atypical atrial flutter to sinus rhythm 2.  No early apparent complications.   Alisi Lupien,MD 1:21 PM 05/22/2013

## 2013-05-22 NOTE — Discharge Instructions (Signed)
Follow with Primary MD Unice Cobble, MD in 3 days, follow final stool culture results primary care physician next visit  Get CBC, CMP, checked 3 days by Primary MD and again as instructed by your Primary MD.    Activity: As tolerated with Full fall precautions use walker/cane & assistance as needed   Disposition Home    Diet: Heart Healthy   For Heart failure patients - Check your Weight same time everyday, if you gain over 2 pounds, or you develop in leg swelling, experience more shortness of breath or chest pain, call your Primary MD immediately. Follow Cardiac Low Salt Diet and 1.8 lit/day fluid restriction.   On your next visit with her primary care physician please Get Medicines reviewed and adjusted.  Please request your Prim.MD to go over all Hospital Tests and Procedure/Radiological results at the follow up, please get all Hospital records sent to your Prim MD by signing hospital release before you go home.   If you experience worsening of your admission symptoms, develop shortness of breath, life threatening emergency, suicidal or homicidal thoughts you must seek medical attention immediately by calling 911 or calling your MD immediately  if symptoms less severe.  You Must read complete instructions/literature along with all the possible adverse reactions/side effects for all the Medicines you take and that have been prescribed to you. Take any new Medicines after you have completely understood and accpet all the possible adverse reactions/side effects.   Do not drive and provide baby sitting services if your were admitted for syncope or siezures until you have seen by Primary MD or a Neurologist and advised to do so again.  Do not drive when taking Pain medications.    Do not take more than prescribed Pain, Sleep and Anxiety Medications  Special Instructions: If you have smoked or chewed Tobacco  in the last 2 yrs please stop smoking, stop any regular Alcohol  and or any  Recreational drug use.  Wear Seat belts while driving.   Please note  You were cared for by a hospitalist during your hospital stay. If you have any questions about your discharge medications or the care you received while you were in the hospital after you are discharged, you can call the unit and asked to speak with the hospitalist on call if the hospitalist that took care of you is not available. Once you are discharged, your primary care physician will handle any further medical issues. Please note that NO REFILLS for any discharge medications will be authorized once you are discharged, as it is imperative that you return to your primary care physician (or establish a relationship with a primary care physician if you do not have one) for your aftercare needs so that they can reassess your need for medications and monitor your lab values.

## 2013-05-22 NOTE — Consult Note (Signed)
Cardiology Consultation Note  Patient ID: Kenneth [name], MRN: [id number], DOB/AGE: June 12, 1945 68 y.o. Admit date: 05/21/2013   Date of Consult: 05/22/2013 Primary Physician: Unice Cobble, MD Primary Cardiologist: Stanford Breed  Primary EP ; Caryl Comes   Chief Complaint: N/V/ Diarrhea  Reason for Consult: Afib/ flutter   HPI: 68 yr old male with hx of Paroxysmal Afib and recurrent atypical atrial flutter last DCCV in 03/2013 by Dr Verner Mould, lastseen in clinic by Dr Rayann Heman on 05/03/2013 in cardiology clinic when he was sinus rhythm now present to the hospital with N/ V / diarrhea and found to be in Aflutter   Pt states that over the past past 3 days he has been having several episodes of Nauseas , vomiting  And diarrhea . He has been unable to eat anything and had to also miss some of his home medications including xarelto. Today he felt sudden onset chest palpitations and his HR was 150 plus. He was unable to bring this down with flecainide dosing and therefore decided to come into the ER. overthere pt was found to be dehydrated with lactate 3.0 . EKG shows SVT ( likely atypical flutter with HR 134.  Pt denies any chest pain , SOB , orthopnea, PND , LE edema , Syncope ,claudication , focal weakness, Does have occasional epistaxis when he blow his nose.       Past Medical History  Diagnosis Date  . Paroxysmal atrial fibrillation     a. 11/2003 Tikosyn initiated - subsequently d/c'd;  b. 2006 RFCA for Afib @ [facility];  c. 09/2011 Echo: EF 60-65%, Gr 2 DD;  d. 10/2011 DCCV  . Psoriasis   . Dyspepsia   . Deafness   . Kidney stones   . Hyperlipidemia   . Macular degeneration     pt reports that he actually has a "retina problem' not MD  . Pneumonia 2009    in context of Flu  . Atypical chest pain     a. 06/2003 Cath:  nl cors;  b. 11/2006 MV: no ischemia, nl LV      Most Recent Cardiac Studies: EKG  Aflutter , prolonged Qt   Echo 09/2011 Left ventricle: The cavity size was at the upper limits  of normal. Wall thickness was increased in a pattern of mild LVH. Systolic function was normal. The estimated ejection fraction was in the range of 60% to 65%. Wall motion was normal; there were no regional wall motion abnormalities. Features are consistent with a pseudonormal left ventricular filling pattern, with concomitant abnormal relaxation and increased filling pressure (grade 2 diastolic dysfunction).     Surgical History:  Past Surgical History  Procedure Laterality Date  . Rotator cuff repair    . Atrial fibrillation ablation  2006    Dr Rolland Porter at [place]: Prior to Admission medications   Medication Sig Start Date End Date Taking? Authorizing Provider  b complex vitamins capsule Take 1 capsule by mouth daily.   Yes Historical Provider, MD  budesonide-formoterol (SYMBICORT) 160-4.5 MCG/ACT inhaler Inhale 2 puffs into the lungs as directed. 05/03/13  Yes [name], MD  cetirizine (ZYRTEC) 10 MG tablet Take 10 mg by mouth daily.     Yes Historical Provider, MD  diltiazem (CARDIZEM CD) 120 MG 24 hr capsule Take 1 capsule (120 mg total) by mouth daily. 04/28/12  Yes Rogelia Mire, NP  etanercept (ENBREL) 25 MG injection Inject 50 mg into the skin every Saturday.  Yes Historical Provider, MD  ferrous sulfate 325 (65 FE) MG tablet Take 325 mg by mouth daily with breakfast.   Yes Historical Provider, MD  flecainide (TAMBOCOR) 100 MG tablet Take 300 mg by mouth as needed (for SVT).  03/25/13  Yes Historical Provider, MD  folic acid (FOLVITE) 1 MG tablet Take 1 mg by mouth daily.   Yes Historical Provider, MD  Magnesium 400 MG CAPS Take 400 mg by mouth 2 (two) times daily.    Yes Historical Provider, MD  methotrexate (RHEUMATREX) 2.5 MG tablet Take 5 mg by mouth every Tuesday. Caution:Chemotherapy. Protect from light.   Yes Historical Provider, MD  metoprolol tartrate (LOPRESSOR) 25 MG tablet Take 0.5 tablets (12.5 mg total) by mouth 2 (two) times daily. 04/28/12   Yes Rogelia Mire, NP  omeprazole (PRILOSEC) 20 MG capsule Take 20 mg by mouth 2 (two) times daily before a meal.   Yes Historical Provider, MD  Rivaroxaban (XARELTO) 20 MG TABS tablet Take 1 tablet (20 mg total) by mouth daily. 04/14/13  Yes [name], MD  zolpidem (AMBIEN) 10 MG tablet Take 10 mg by mouth at bedtime as needed.   Yes Historical Provider, MD  albuterol (PROVENTIL HFA;VENTOLIN HFA) 108 (90 BASE) MCG/ACT inhaler Inhale into the lungs every 6 (six) hours as needed for wheezing or shortness of breath.    Historical Provider, MD    Inpatient Medications:    . diltiazem (CARDIZEM) infusion      Allergies:  Allergies  Allergen Reactions  . Decongestant [Pseudoephedrine Hcl Er] Palpitations and Other (See Comments)  . Pseudoephedrine Palpitations    History   Social History  . Marital Status: Married    Spouse Name: N/A    Number of Children: N/A  . Years of Education: N/A   Occupational History  . Retired Armed forces operational officer    Social History Main Topics  . Smoking status: Former Smoker    Quit date: 09/29/2001  . Smokeless tobacco: Not on file  . Alcohol Use: No  . Drug Use: No  . Sexual Activity: Not on file   Other Topics Concern  . Not on file   Social History Narrative   Pt lives in [place] with spouse.     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     Family History  Problem Relation Age of Onset  . Coronary artery disease Mother   . Cancer Mother     BREAST,COLON  . Heart disease Maternal Grandmother     MI  . Stroke Paternal Grandfather     CVA  . Coronary artery disease Paternal Grandfather      Review of Systems: General: negative for chills, fever, night sweats or weight changes.  Cardiovascular:per HPI  Dermatological: negative for rash Respiratory: negative for cough or wheezing Urologic: negative for hematuria Abdominal: nper HPI  Neurologic: negative for visual changes, syncope, or dizziness All other systems  reviewed and are otherwise negative except as noted above.  Labs: No results found for this basename: CKTOTAL, CKMB, TROPONINI,  in the last 72 hours Lab Results  Component Value Date   WBC 8.0 05/21/2013   HGB 17.1* 05/21/2013   HCT 47.2 05/21/2013   MCV 84.4 05/21/2013   PLT 224 05/21/2013    Recent Labs Lab 05/21/13 2016  NA 133*  K 3.6*  CL 98  CO2 14*  BUN 14  CREATININE 1.04  CALCIUM 9.2  PROT 8.1  BILITOT 0.8  ALKPHOS 84  ALT 45  AST 49*  GLUCOSE 149*   Lab Results  Component Value Date   CHOL  Value: 246        ATP III CLASSIFICATION:  <200     mg/dL   Desirable  200-239  mg/dL   Borderline High  >=240    mg/dL   High       * 08/17/2009   HDL 26* 08/17/2009   LDLCALC  Value: UNABLE TO CALCULATE IF TRIGLYCERIDE OVER 400 mg/dL        Total Cholesterol/HDL:CHD Risk Coronary Heart Disease Risk Table                     Men   Women  1/2 Average Risk   3.4   3.3  Average Risk       5.0   4.4  2 X Average Risk   9.6   7.1  3 X Average Risk  23.4   11.0        Use the calculated Patient Ratio above and the CHD Risk Table to determine the patient's CHD Risk.        ATP III CLASSIFICATION (LDL):  <100     mg/dL   Optimal  100-129  mg/dL   Near or Above                    Optimal  130-159  mg/dL   Borderline  160-189  mg/dL   High  >190     mg/dL   Very High 08/17/2009   TRIG 866* 08/17/2009   Lab Results  Component Value Date   DDIMER 0.47 02/21/2011    Radiology/Studies:  Dg Chest Port 1 View  05/21/2013   CLINICAL DATA:  Chest pain, shortness of breath.  EXAM: PORTABLE CHEST - 1 VIEW  COMPARISON:  October 22, 2011.  FINDINGS: The heart size and mediastinal contours are within normal limits. Both lungs are clear. No pleural effusion or pneumothorax is noted. The visualized skeletal structures are unremarkable.  IMPRESSION: No acute cardiopulmonary abnormality seen.   Electronically Signed   By: Sabino Dick M.D.   On: 05/21/2013 20:44    EKG: as above   Physical Exam: Blood  pressure 133/68, pulse 135, temperature 97.5 F (36.4 C), temperature source Oral, resp. rate 18, height 6\' 3"  (1.905 m), SpO2 98.00%. General: Well developed, well nourished, in no acute distress. Head: Normocephalic, atraumatic, sclera non-icteric, no xanthomas, nares are without discharge.  Neck: Negative for carotid bruits. JVD not elevated. Lungs: Clear bilaterally to auscultation without wheezes, rales, or rhonchi. Breathing is unlabored. Heart: RRR with S1 S2. No murmurs, rubs, or gallops appreciated. Abdomen: Soft, non-tender, non-distended with normoactive bowel sounds. No hepatomegaly. No rebound/guarding. No obvious abdominal masses. Msk:  Strength and tone appear normal for age. Extremities: No clubbing or cyanosis. No edema.  Distal pedal pulses are 2+ and equal bilaterally. Psych:  Responds to questions appropriately with a normal affect.     Assessment and Plan: Afib with RVR  Prolonged QT    Plan  -Likely trigger for arrythmia is underlying metabolic abnormalities .IV hydration and workup for that per primary team  - Keep K  >4 and Mag > 2, monitor QT  - Cont dilt gtt for now  - If Fib/flutter  remains despite resolution of metabolic abn we can consider repeat  TEE guided DCCV ( likely needs TEE since he missed a few doses Xarelto leading upto the hospitilization)   Signed, [place],  Xaviera Flaten, A M.D  05/22/2013, 12:41 AM

## 2013-05-22 NOTE — Anesthesia Postprocedure Evaluation (Signed)
  Anesthesia Post-op Note  Patient: Kenneth [name]  Procedure(s) Performed: Procedure(s): CARDIOVERSION (N/A)  Patient Location: ICU  Anesthesia Type:General  Level of Consciousness: awake, alert  and oriented  Airway and Oxygen Therapy: Patient Spontanous Breathing and Patient connected to nasal cannula oxygen  Post-op Pain: none  Post-op Assessment: Post-op Vital signs reviewed, Patient's Cardiovascular Status Stable, Respiratory Function Stable, Patent Airway and No signs of Nausea or vomiting  Post-op Vital Signs: Reviewed and stable  Complications: No apparent anesthesia complications

## 2013-05-22 NOTE — Transfer of Care (Signed)
Immediate Anesthesia Transfer of Care Note  Patient: Kenneth [name]  Procedure(s) Performed: Procedure(s): CARDIOVERSION (N/A)  Patient Location: ICU  Anesthesia Type:General  Level of Consciousness: awake, alert  and oriented  Airway & Oxygen Therapy: Patient Spontanous Breathing and Patient connected to nasal cannula oxygen  Post-op Assessment: Report given to PACU RN, Post -op Vital signs reviewed and stable and Patient moving all extremities X 4  Post vital signs: Reviewed and stable  Complications: No apparent anesthesia complications

## 2013-05-25 ENCOUNTER — Encounter (HOSPITAL_COMMUNITY): Payer: Self-pay | Admitting: Internal Medicine

## 2013-07-05 ENCOUNTER — Encounter: Payer: Self-pay | Admitting: Nurse Practitioner

## 2013-07-13 ENCOUNTER — Encounter: Payer: Self-pay | Admitting: *Deleted

## 2013-07-15 ENCOUNTER — Ambulatory Visit (INDEPENDENT_AMBULATORY_CARE_PROVIDER_SITE_OTHER): Payer: Medicare Other | Admitting: Nurse Practitioner

## 2013-07-15 ENCOUNTER — Other Ambulatory Visit (INDEPENDENT_AMBULATORY_CARE_PROVIDER_SITE_OTHER): Payer: Medicare Other

## 2013-07-15 ENCOUNTER — Encounter: Payer: Self-pay | Admitting: Nurse Practitioner

## 2013-07-15 ENCOUNTER — Telehealth: Payer: Self-pay | Admitting: *Deleted

## 2013-07-15 VITALS — BP 140/70 | HR 62 | Ht 75.0 in | Wt 260.0 lb

## 2013-07-15 DIAGNOSIS — Z7901 Long term (current) use of anticoagulants: Secondary | ICD-10-CM

## 2013-07-15 DIAGNOSIS — D509 Iron deficiency anemia, unspecified: Secondary | ICD-10-CM

## 2013-07-15 DIAGNOSIS — I4891 Unspecified atrial fibrillation: Secondary | ICD-10-CM

## 2013-07-15 DIAGNOSIS — Z860100 Personal history of colon polyps, unspecified: Secondary | ICD-10-CM | POA: Insufficient documentation

## 2013-07-15 LAB — FERRITIN: Ferritin: 39.6 ng/mL (ref 22.0–322.0)

## 2013-07-15 LAB — IRON AND TIBC
%SAT: 39 % (ref 20–55)
Iron: 191 ug/dL — ABNORMAL HIGH (ref 42–165)
TIBC: 485 ug/dL — AB (ref 215–435)
UIBC: 294 ug/dL (ref 125–400)

## 2013-07-15 MED ORDER — MOVIPREP 100 G PO SOLR: 1.0000 | Freq: Once | ORAL | Status: DC

## 2013-07-15 NOTE — Telephone Encounter (Signed)
07/15/2013   RE: ASCHER SCHROEPFER DOB: 02-Aug-1945 MRN: [id number]   Dear Dr. Rayann Heman,    We have scheduled the above patient for an endoscopic procedure. Our records show that he is on anticoagulation therapy.   Please advise as to how long the patient may come off his therapy of Xarelto prior to the procedure, which is scheduled for 09-07-2013.  Please fax back/ or route the completed form to [place], [place].   Sincerely,    Carlyle Dolly

## 2013-07-15 NOTE — Progress Notes (Signed)
Reviewed and agree with management plan. If anticoagulation cannot be safely held at this time please schedule ACBE or virtual colonoscopy while remaining on anticoagulation.   Pricilla Riffle. Fuller Plan, MD [facility]

## 2013-07-15 NOTE — Progress Notes (Signed)
HPI :  Patient is a 68 year old male known remotely to Dr. Fuller Plan for a history of adenomatous colon polyps December 2006. We sent him a colonoscopy recall letter December 2011, he unfortunately did not get it. He is now referred by PCP for anemia . No overt GI blood loss. No black stools. Patient is on chronic iron. Patient has no lower GI complaints. He does have a longstanding history of GERD. PCP recommended substitution of PPI for Zantac. Patient tried Zantac but had recurrent heartburn. He then tried one Zantac in am and a PPI at night but this has been of limited benefit and he wants to resume BID PPI. Patient had an upper endoscopy 1999 with findings of grade 1 esophagitis.   As far as anemia, patient has been on iron for over a year. Hgb has been between 14 and 15 since at least January 2014. MCV is normal at 85. I don't have labs from PCP but patient assures me he hasn't had any labs drawn since February hospitalization  Patient has a history of paroxysmal atrial fibrillation and COPD. He underwent cardioversion November 2014 . Patient was hospitalized with gastroenteritis in mid February. During that same admission he underwent repeat cardioversion. Patient takes Engineer, materials   Past Medical History  Diagnosis Date  . Paroxysmal atrial fibrillation     a. 11/2003 Tikosyn initiated - subsequently d/c'd;  b. 2006 RFCA for Afib @ [facility];  c. 09/2011 Echo: EF 60-65%, Gr 2 DD;  d. 10/2011 DCCV  . Psoriasis   . Deafness   . Kidney stones   . Hyperlipidemia   . Macular degeneration     pt reports that he actually has a "retina problem' not MD  . Pneumonia 2009    in context of Flu  . Atypical chest pain     a. 06/2003 Cath:  nl cors;  b. 11/2006 MV: no ischemia, nl LV  . Cataract   . Skin cancer     on leg  . COPD (chronic obstructive pulmonary disease)   . Diverticulosis of colon (without mention of hemorrhage) 1999, 2006  . Hiatal hernia 1999  . Esophagitis 1999  .  Adenomatous polyp of colon 2006  . Arthritis     Family History  Problem Relation Age of Onset  . Coronary artery disease Mother   . Breast cancer Mother   . Heart disease Maternal Grandmother     MI  . Stroke Paternal Grandfather     CVA  . Coronary artery disease Paternal Grandfather   . Colon cancer Mother    History  Substance Use Topics  . Smoking status: Former Smoker -- 10 years    Types: Cigars    Quit date: 09/29/2001  . Smokeless tobacco: Former Systems developer    Quit date: 09/29/2001  . Alcohol Use: No   Current Outpatient Prescriptions  Medication Sig Dispense Refill  . albuterol (PROVENTIL HFA;VENTOLIN HFA) 108 (90 BASE) MCG/ACT inhaler Inhale into the lungs every 6 (six) hours as needed for wheezing or shortness of breath.      Marland Kitchen b complex vitamins capsule Take 1 capsule by mouth daily.      . budesonide-formoterol (SYMBICORT) 160-4.5 MCG/ACT inhaler Inhale 2 puffs into the lungs as directed.  1 Inhaler  0  . cetirizine (ZYRTEC) 10 MG tablet Take 10 mg by mouth daily.        Marland Kitchen diltiazem (CARDIZEM CD) 120 MG 24 hr capsule Take 1 capsule (120 mg total)  by mouth daily.  30 capsule  6  . etanercept (ENBREL) 25 MG injection Inject 50 mg into the skin every Saturday.       . ferrous sulfate 325 (65 FE) MG tablet Take 325 mg by mouth daily with breakfast.      . flecainide (TAMBOCOR) 100 MG tablet Take by mouth as directed.       . folic acid (FOLVITE) 1 MG tablet Take 1 mg by mouth daily.      . Magnesium 400 MG CAPS Take 400 mg by mouth 2 (two) times daily.       . methotrexate (RHEUMATREX) 2.5 MG tablet Take 5 mg by mouth every Tuesday. Caution:Chemotherapy. Protect from light.      . metoprolol tartrate (LOPRESSOR) 25 MG tablet Take 0.5 tablets (12.5 mg total) by mouth 2 (two) times daily.  30 tablet  3  . naproxen sodium (ANAPROX) 220 MG tablet Take 220 mg by mouth 2 (two) times daily with a meal.      . omeprazole (PRILOSEC) 20 MG capsule Take 20 mg by mouth at bedtime.        . Probiotic Product (ALIGN) 4 MG CAPS Take 1 capsule by mouth daily.      . ranitidine (ZANTAC) 150 MG tablet Take 150 mg by mouth every morning.       . Rivaroxaban (XARELTO) 20 MG TABS tablet Take 1 tablet (20 mg total) by mouth daily.  90 tablet  2  . zolpidem (AMBIEN) 10 MG tablet Take 10 mg by mouth at bedtime as needed.       No current facility-administered medications for this visit.   Facility-Administered Medications Ordered in Other Visits  Medication Dose Route Frequency Provider Last Rate Last Dose  . lidocaine (cardiac) 100 mg/16ml (XYLOCAINE) 20 MG/ML injection 2%    Anesthesia Intra-op Rokoshi T Flowers, CRNA   60 mg at 03/25/13 1617  . propofol (DIPRIVAN) 10 mg/mL bolus/IV push    Anesthesia Intra-op Durwin Glaze Flowers, CRNA   90 mg at 03/25/13 1617   Allergies  Allergen Reactions  . Decongestant [Pseudoephedrine Hcl Er] Palpitations and Other (See Comments)  . Pseudoephedrine Palpitations   Review of Systems: Positive for allergies/sinus trouble , arthritis, hearing problems, heart rhythm changes, psoriasis, nosebleeds and shortness of breath.  All other systems reviewed and negative except where noted in HPI.   Physical Exam: BP 140/70  Pulse 62  Ht 6\' 3"  (1.905 m)  Wt 260 lb (117.935 kg)  BMI 32.50 kg/m2 Constitutional: Pleasant,well-developed, white male in no acute distress. HEENT: Normocephalic and atraumatic. Conjunctivae are normal. No scleral icterus. Neck supple.  Cardiovascular: Normal rate, regular rhythm.  Pulmonary/chest: Effort normal and breath sounds normal. No wheezing, rales or rhonchi. Abdominal: Soft, nondistended, nontender. Bowel sounds active throughout. There are no masses palpable. No hepatomegaly. Extremities: no edema Lymphadenopathy: No cervical adenopathy noted. Neurological: Alert and oriented to person place and time. Skin: Skin is warm and dry. No rashes noted. Psychiatric: Normal mood and affect. Behavior is  normal.   ASSESSMENT AND PLAN:  51. 68 year old male with a history of adenomatous colon polyps 2006, overdue for surveillance colonoscopy.The risks, benefits, and alternatives to colonoscopy with possible biopsy and possible polypectomy were discussed with the patient and he consents to proceed.   2. Atrial fibrillation, status post cardioversion in November and again in mid February. Patient is on Xarelto. We will contact cardiology about holding Xareltol for procedure. If cardiology is not comfortable with this then  the procedure will need to be postponed.  3. Chronic immunosuppression, on methotrexate and Embrel for psoriasis.  4. ? history of anemia. I do not have records from PCP in [place]. Patient has been on iron for one year. His hgb has been between 14-15 since January 2014 and MCV is normal. I advised patient to hold iron.  Will obtain iron studies today.  He will need followup CBC with PCP in a couple of months.  5. GERD. Asymptomatic on BID PPI. He recently tried changing to H2 blocker with limited benfit.

## 2013-07-15 NOTE — Telephone Encounter (Signed)
Hold xarelto 24-48 hours prior to the procedure and resume 24 hours afterwards.

## 2013-07-15 NOTE — Patient Instructions (Addendum)
You have been scheduled for a colonoscopy with propofol with Dr.Stark. Please follow written instructions given to you at your visit today.  Please pick up your prep kit at the pharmacy within the next 1-3 days. If you use inhalers (even only as needed), please bring them with you on the day of your procedure. Your physician has requested that you go to www.startemmi.com and enter the access code given to you at your visit today. This web site gives a general overview about your procedure. However, you should still follow specific instructions given to you by our office regarding your preparation for the procedure.  Please stop your Iron  Your physician has requested that you go to the basement for the following lab work before leaving today: Ferritin  TIBC   

## 2013-07-16 NOTE — Telephone Encounter (Signed)
I advised patient to hold Xarelto 24 hours prior to procedure Patient verbalized understanding

## 2013-08-11 IMAGING — CR DG CHEST 1V PORT
1 series · 1 of 1 positions shown · non-contrast
Comparison: 02/21/2011

CLINICAL DATA: Arrhythmia.  Tachycardia.

PORTABLE CHEST - 1 VIEW

[AP]
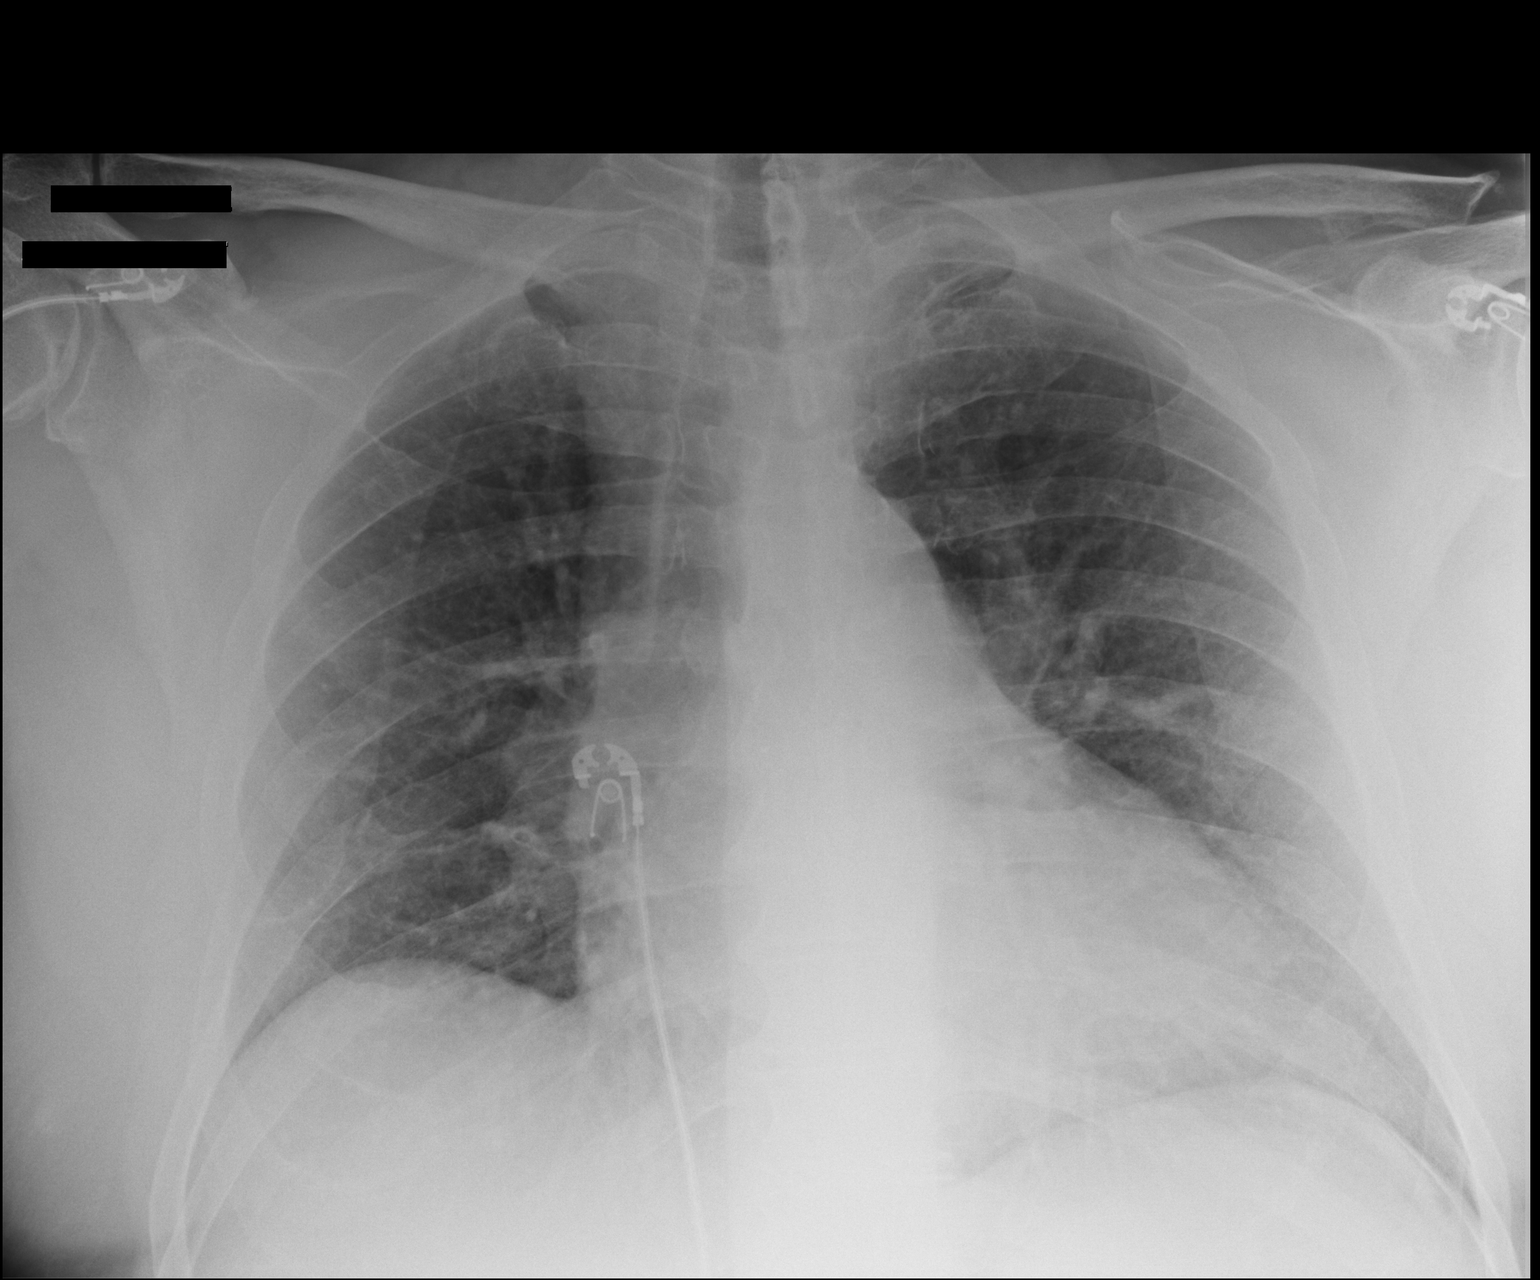

[1 of 1 positions shown; findings below may reference images not displayed]

FINDINGS: Heart size and pulmonary vascularity are normal and the
lungs are clear.  No acute osseous abnormality. There appears to
have been previous resection of the distal left clavicle.
IMPRESSION: No acute abnormalities.

## 2013-08-23 ENCOUNTER — Encounter: Payer: Self-pay | Admitting: Gastroenterology

## 2013-09-07 ENCOUNTER — Encounter: Payer: Self-pay | Admitting: Gastroenterology

## 2013-09-07 ENCOUNTER — Ambulatory Visit (AMBULATORY_SURGERY_CENTER): Payer: Medicare Other | Admitting: Gastroenterology

## 2013-09-07 VITALS — BP 130/47 | HR 71 | Temp 96.9°F | Resp 18 | Ht 75.0 in | Wt 260.0 lb

## 2013-09-07 DIAGNOSIS — D126 Benign neoplasm of colon, unspecified: Secondary | ICD-10-CM

## 2013-09-07 DIAGNOSIS — D649 Anemia, unspecified: Secondary | ICD-10-CM

## 2013-09-07 DIAGNOSIS — Z86010 Personal history of colonic polyps: Secondary | ICD-10-CM

## 2013-09-07 MED ORDER — SODIUM CHLORIDE 0.9 % IV SOLN: 500.0000 mL | INTRAVENOUS | Status: DC

## 2013-09-07 NOTE — Patient Instructions (Signed)

## 2013-09-07 NOTE — Op Note (Signed)
[place]  [address]. [place], [id number]   COLONOSCOPY PROCEDURE REPORT PATIENT: Kenneth [name], Kenneth [name]  MR#: [id number] BIRTHDATE: [birth year] , 68  yrs. old GENDER: Male ENDOSCOPIST: Ladene Artist, MD, [facility] REFERRED YP:PJKD Armour, M.D. PROCEDURE DATE:  09/07/2013 PROCEDURE:   Colonoscopy with snare polypectomy First Screening Colonoscopy - Avg.  risk and is 50 yrs.  old or older - No.  Prior Negative Screening - Now for repeat screening. N/A  History of Adenoma - Now for follow-up colonoscopy & has been > or = to 3 yrs.  Yes hx of adenoma.  Has been 3 or more years since last colonoscopy.  Polyps Removed Today? Yes. ASA CLASS:   Class III INDICATIONS:Patient's personal history of adenomatous colon polyps.  MEDICATIONS: MAC sedation, administered by CRNA and propofol (Diprivan) 240mg  IV DESCRIPTION OF PROCEDURE:   After the risks benefits and alternatives of the procedure were thoroughly explained, informed consent was obtained.  A digital rectal exam revealed no abnormalities of the rectum.   The LB [id number]  endoscope was introduced through the anus and advanced to the cecum, which was identified by both the appendix and ileocecal valve. No adverse events experienced.   The quality of the prep was good, using MoviPrep  The instrument was then slowly withdrawn as the colon was fully examined.  COLON FINDINGS: Two sessile polyps measuring 5-6 mm in size were found in the transverse colon and sigmoid colon.  A polypectomy was performed with a cold snare.  The resection was complete and the polyp tissue was completely retrieved.   Moderate diverticulosis was noted in the descending colon and sigmoid colon.   The colon was otherwise normal.  There was no diverticulosis, inflammation, polyps or cancers unless previously stated.  Retroflexed views revealed no abnormalities. The time to cecum=1 minutes 59 seconds. Withdrawal time=12 minutes 34 seconds.   The scope was withdrawn and the procedure completed. COMPLICATIONS: There were no complications. ENDOSCOPIC IMPRESSION: 1.   Two sessile polyps measuring 5-6 mm in the transverse and sigmoid colon; polypectomy performed with a cold snare 2.   Moderate diverticulosis in the descending colon and sigmoid colon 3.   The colon was otherwise normal RECOMMENDATIONS: 1.  Hold aspirin, aspirin products, and anti-inflammatory medication for 2 weeks. 2.  Resume Xarelto in 2 days 3.  High fiber diet with liberal fluid intake. 4.  Repeat Colonoscopy in 5 years. 5.  Await pathology  eSigned:  Ladene Artist, MD, [facility] 09/07/2013 9:44 AM   cc: Stephannie Peters, MD

## 2013-09-07 NOTE — Progress Notes (Signed)
A/ox3, pleased with MAC, report to RN 

## 2013-09-07 NOTE — Progress Notes (Signed)
Called to room to assist during endoscopic procedure.  Patient ID and intended procedure confirmed with present staff. Received instructions for my participation in the procedure from the performing physician.  

## 2013-09-08 ENCOUNTER — Telehealth: Payer: Self-pay | Admitting: *Deleted

## 2013-09-08 NOTE — Telephone Encounter (Signed)
  Follow up Call-  Call back number 09/07/2013  Post procedure Call Back phone  # wife- [phone]  Permission to leave phone message Yes    [facility]

## 2013-09-14 ENCOUNTER — Encounter: Payer: Self-pay | Admitting: Gastroenterology

## 2013-09-27 ENCOUNTER — Emergency Department (HOSPITAL_COMMUNITY): Payer: Medicare Other

## 2013-09-27 ENCOUNTER — Emergency Department (HOSPITAL_COMMUNITY)
Admission: EM | Admit: 2013-09-27 | Discharge: 2013-09-27 | Disposition: A | Discharge status: Home / Self Care | Payer: Medicare Other | Attending: Emergency Medicine | Admitting: Emergency Medicine

## 2013-09-27 ENCOUNTER — Encounter (HOSPITAL_COMMUNITY): Payer: Self-pay | Admitting: Emergency Medicine

## 2013-09-27 DIAGNOSIS — Z85828 Personal history of other malignant neoplasm of skin: Secondary | ICD-10-CM | POA: Insufficient documentation

## 2013-09-27 DIAGNOSIS — R0600 Dyspnea, unspecified: Secondary | ICD-10-CM

## 2013-09-27 DIAGNOSIS — H9190 Unspecified hearing loss, unspecified ear: Secondary | ICD-10-CM | POA: Insufficient documentation

## 2013-09-27 DIAGNOSIS — Z8719 Personal history of other diseases of the digestive system: Secondary | ICD-10-CM | POA: Insufficient documentation

## 2013-09-27 DIAGNOSIS — M129 Arthropathy, unspecified: Secondary | ICD-10-CM | POA: Insufficient documentation

## 2013-09-27 DIAGNOSIS — Z791 Long term (current) use of non-steroidal anti-inflammatories (NSAID): Secondary | ICD-10-CM | POA: Insufficient documentation

## 2013-09-27 DIAGNOSIS — Z8701 Personal history of pneumonia (recurrent): Secondary | ICD-10-CM | POA: Insufficient documentation

## 2013-09-27 DIAGNOSIS — Z7901 Long term (current) use of anticoagulants: Secondary | ICD-10-CM | POA: Insufficient documentation

## 2013-09-27 DIAGNOSIS — Z87891 Personal history of nicotine dependence: Secondary | ICD-10-CM | POA: Insufficient documentation

## 2013-09-27 DIAGNOSIS — E785 Hyperlipidemia, unspecified: Secondary | ICD-10-CM | POA: Insufficient documentation

## 2013-09-27 DIAGNOSIS — R0609 Other forms of dyspnea: Secondary | ICD-10-CM | POA: Insufficient documentation

## 2013-09-27 DIAGNOSIS — Z9889 Other specified postprocedural states: Secondary | ICD-10-CM | POA: Insufficient documentation

## 2013-09-27 DIAGNOSIS — I4891 Unspecified atrial fibrillation: Secondary | ICD-10-CM | POA: Insufficient documentation

## 2013-09-27 DIAGNOSIS — Z860100 Personal history of colon polyps, unspecified: Secondary | ICD-10-CM | POA: Insufficient documentation

## 2013-09-27 DIAGNOSIS — Z87442 Personal history of urinary calculi: Secondary | ICD-10-CM | POA: Insufficient documentation

## 2013-09-27 DIAGNOSIS — Z79899 Other long term (current) drug therapy: Secondary | ICD-10-CM | POA: Insufficient documentation

## 2013-09-27 DIAGNOSIS — R0989 Other specified symptoms and signs involving the circulatory and respiratory systems: Principal | ICD-10-CM | POA: Insufficient documentation

## 2013-09-27 DIAGNOSIS — J4489 Other specified chronic obstructive pulmonary disease: Secondary | ICD-10-CM | POA: Insufficient documentation

## 2013-09-27 DIAGNOSIS — J449 Chronic obstructive pulmonary disease, unspecified: Secondary | ICD-10-CM | POA: Insufficient documentation

## 2013-09-27 DIAGNOSIS — Z872 Personal history of diseases of the skin and subcutaneous tissue: Secondary | ICD-10-CM | POA: Insufficient documentation

## 2013-09-27 LAB — COMPREHENSIVE METABOLIC PANEL
ALT: 34 U/L (ref 0–53)
AST: 20 U/L (ref 0–37)
Albumin: 4.5 g/dL (ref 3.5–5.2)
Alkaline Phosphatase: 91 U/L (ref 39–117)
BILIRUBIN TOTAL: 0.4 mg/dL (ref 0.3–1.2)
BUN: 18 mg/dL (ref 6–23)
CHLORIDE: 102 meq/L (ref 96–112)
CO2: 23 mEq/L (ref 19–32)
Calcium: 9.8 mg/dL (ref 8.4–10.5)
Creatinine, Ser: 0.94 mg/dL (ref 0.50–1.35)
GFR calc Af Amer: >90 mL/min (ref >90)
GFR calc non Af Amer: 84 mL/min — ABNORMAL LOW (ref >90)
Glucose, Bld: 191 mg/dL — ABNORMAL HIGH (ref 70–99)
Potassium: 4.3 mEq/L (ref 3.7–5.3)
Sodium: 141 mEq/L (ref 137–147)
Total Protein: 7.6 g/dL (ref 6.0–8.3)

## 2013-09-27 LAB — CBC
HCT: 43 % (ref 39.0–52.0)
Hemoglobin: 14.4 g/dL (ref 13.0–17.0)
MCH: 29.8 pg (ref 26.0–34.0)
MCHC: 33.5 g/dL (ref 30.0–36.0)
MCV: 88.8 fL (ref 78.0–100.0)
Platelets: 227 10*3/uL (ref 150–400)
RBC: 4.84 MIL/uL (ref 4.22–5.81)
RDW: 14 % (ref 11.5–15.5)
WBC: 9 10*3/uL (ref 4.0–10.5)

## 2013-09-27 LAB — D-DIMER, QUANTITATIVE (NOT AT ARMC): D-Dimer, Quant: 0.3 ug/mL-FEU (ref 0.00–0.48)

## 2013-09-27 LAB — PRO B NATRIURETIC PEPTIDE: PRO B NATRI PEPTIDE: 76.7 pg/mL (ref 0–125)

## 2013-09-27 LAB — I-STAT TROPONIN, ED: TROPONIN I, POC: 0 ng/mL (ref 0.00–0.08)

## 2013-09-27 MED ORDER — PREDNISONE 20 MG PO TABS
60.0000 mg | ORAL_TABLET | ORAL | Status: AC
  Administered 2013-09-27: 60 mg via ORAL
  Filled 2013-09-27: qty 3

## 2013-09-27 MED ORDER — PREDNISONE 20 MG PO TABS
60.0000 mg | ORAL_TABLET | Freq: Every day | ORAL | Status: AC
Start: 2013-09-28 — End: 2013-10-01

## 2013-09-27 NOTE — ED Provider Notes (Signed)
CSN: [id number]     Arrival date & time 09/27/13  1506 History   First MD Initiated Contact with Patient 09/27/13 1653     Chief Complaint  Patient presents with  . Shortness of Breath     (Consider location/radiation/quality/duration/timing/severity/associated sxs/prior Treatment) HPI Patient presents with concerns of dyspnea. Symptoms began approximately 4 days ago. Symptoms are more present at night, though not only with supine positioning. There is no associated chest pain, abdominal pain, lightheadedness, syncope. Patient was sent here from the [place] clinic to 2 concerns of the dyspnea. Patient does not smoke,has no recent travel history, has no asymmetric, edema.  Patient takes Xarelto due to atrial fibrillation.  Past Medical History  Diagnosis Date  . Paroxysmal atrial fibrillation     a. 11/2003 Tikosyn initiated - subsequently d/c'd;  b. 2006 RFCA for Afib @ [facility];  c. 09/2011 Echo: EF 60-65%, Gr 2 DD;  d. 10/2011 DCCV  . Psoriasis   . Dyspepsia   . Deafness   . Kidney stones   . Hyperlipidemia   . Macular degeneration     pt reports that he actually has a "retina problem' not MD  . Pneumonia 2009    in context of Flu  . Atypical chest pain     a. 06/2003 Cath:  nl cors;  b. 11/2006 MV: no ischemia, nl LV  . Cataract   . COPD (chronic obstructive pulmonary disease)   . Diverticulosis of colon (without mention of hemorrhage) 1999, 2006  . Hiatal hernia 1999  . Esophagitis 1999  . Adenomatous polyp of colon 2006  . Arthritis   . Skin cancer     on leg, left arm   Past Surgical History  Procedure Laterality Date  . Rotator cuff repair    . Atrial fibrillation ablation  2006    Dr Rolland Porter at [facility]  . Eye surgery Bilateral     cataracts with lens implants  . Appendectomy  1996  . Tonsillectomy    . Cardioversion N/A 05/22/2013    Procedure: CARDIOVERSION;  Surgeon: [name], MD;  Location: [facility] OR;  Service: Cardiovascular;  Laterality: N/A;  . Nasal sinus surgery       x 2   Family History  Problem Relation Age of Onset  . Coronary artery disease Mother   . Breast cancer Mother   . Colon cancer Mother   . Heart disease Maternal Grandmother     MI  . Stroke Paternal Grandfather     CVA  . Coronary artery disease Paternal Grandfather   . Esophageal cancer Neg Hx   . Stomach cancer Neg Hx   . Rectal cancer Neg Hx    History  Substance Use Topics  . Smoking status: Former Smoker -- 10 years    Types: Cigars    Quit date: 09/29/2001  . Smokeless tobacco: Former Systems developer    Quit date: 09/29/2001  . Alcohol Use: No    Review of Systems  Constitutional:       Per HPI, otherwise negative  HENT:       Per HPI, otherwise negative  Respiratory:       Per HPI, otherwise negative  Cardiovascular:       Per HPI, otherwise negative  Gastrointestinal: Negative for vomiting.  Endocrine:       Negative aside from HPI  Genitourinary:       Neg aside from HPI   Musculoskeletal:       Per HPI, otherwise negative  Skin: Negative.   Neurological: Negative for syncope.      Allergies  Other and Pseudoephedrine  Home Medications   Prior to Admission medications   Medication Sig Start Date End Date Taking? Authorizing Provider  B Complex-C (SUPER B COMPLEX) TABS Take 1 tablet by mouth daily.   Yes Historical Provider, MD  cetirizine (ZYRTEC) 10 MG chewable tablet Chew 10 mg by mouth daily.   Yes Historical Provider, MD  diltiazem (CARDIZEM CD) 120 MG 24 hr capsule Take 1 capsule (120 mg total) by mouth daily. 04/28/12  Yes Rogelia Mire, NP  etanercept (ENBREL) 50 MG/ML injection Inject 50 mg into the skin every Friday.   Yes Historical Provider, MD  folic acid (FOLVITE) 1 MG tablet Take 1 mg by mouth daily.   Yes Historical Provider, MD  Magnesium 400 MG CAPS Take 400 mg by mouth 2 (two) times daily.    Yes Historical Provider, MD  methotrexate (RHEUMATREX) 2.5 MG tablet Take 12.5 mg by mouth every Tuesday. Caution:Chemotherapy. Protect  from light.   Yes Historical Provider, MD  metoprolol tartrate (LOPRESSOR) 25 MG tablet Take 0.5 tablets (12.5 mg total) by mouth 2 (two) times daily. 04/28/12  Yes Rogelia Mire, NP  naproxen sodium (ANAPROX) 220 MG tablet Take 220 mg by mouth 2 (two) times daily as needed (for pain).   Yes Historical Provider, MD  omeprazole (PRILOSEC) 20 MG capsule Take 20 mg by mouth daily.   Yes Historical Provider, MD  Probiotic Product (ALIGN) 4 MG CAPS Take 4 mg by mouth daily.   Yes Historical Provider, MD  Rivaroxaban (XARELTO) 20 MG TABS tablet Take 1 tablet (20 mg total) by mouth daily. 04/14/13  Yes [name], MD  zolpidem (AMBIEN) 10 MG tablet Take 10 mg by mouth at bedtime as needed for sleep.    Yes Historical Provider, MD  albuterol (PROVENTIL HFA;VENTOLIN HFA) 108 (90 BASE) MCG/ACT inhaler Inhale into the lungs every 6 (six) hours as needed for wheezing or shortness of breath.    Historical Provider, MD  budesonide-formoterol (SYMBICORT) 160-4.5 MCG/ACT inhaler Inhale 2 puffs into the lungs as directed. 05/03/13   [name], MD  flecainide (TAMBOCOR) 100 MG tablet Take 300 mg by mouth as directed. Take 300mg  if heart rate reached 140 beats per minute and monitor heart rate for 3 hours.  If not returned to normal-go to [facility] Emergency Room 03/25/13   Historical Provider, MD   BP 138/59  Pulse 73  Temp(Src) 97.6 F (36.4 C) (Oral)  SpO2 97% Physical Exam  Nursing note and vitals reviewed. Constitutional: He is oriented to person, place, and time. He appears well-developed. No distress.  HENT:  Head: Normocephalic and atraumatic.  Eyes: Conjunctivae and EOM are normal.  Cardiovascular: Normal rate, regular rhythm and intact distal pulses.   Pulmonary/Chest: Effort normal. No stridor. No respiratory distress. He has no wheezes.  Abdominal: He exhibits no distension. There is no tenderness.  Musculoskeletal: He exhibits no edema and no tenderness.  Neurological: He is alert and  oriented to person, place, and time.  Skin: Skin is warm and dry.  Psychiatric: He has a normal mood and affect.    ED Course  Procedures (including critical care time) Labs Review Labs Reviewed  COMPREHENSIVE METABOLIC PANEL - Abnormal; Notable for the following:    Glucose, Bld 191 (*)    GFR calc non Af Amer 84 (*)    All other components within normal limits  CBC  PRO B  NATRIURETIC PEPTIDE  D-DIMER, QUANTITATIVE  I-STAT TROPOININ, ED    Imaging Review No results found.   EKG Interpretation   Date/Time:  Monday September 27 2013 15:32:46 EDT Ventricular Rate:  83 PR Interval:  160 QRS Duration: 82 QT Interval:  372 QTC Calculation: 437 R Axis:   45 Text Interpretation:  Normal sinus rhythm Normal ECG Sinus rhythm Normal  ECG Confirmed by Carmin Muskrat  MD ([id number]) on 09/27/2013 5:26:42 PM     O2- 99%ra, nml  Cardiac: 75 reg, nml  8:15 PM Patient and wife aware of all results.  Discussed return precautions, follow up instructions. MDM   Final diagnoses:  Dyspnea    Patient presents with dyspnea, operation concerns of possible PE.  Patient has minimal risk profile, d-dimer was negative, and he is on Xarelto. His evaluation here is largely reassuring.  The dyspnea is likely due to bronchitis versus other inflammatory process.  Vital signs are stable, labs unremarkable, and he was discharged in stable condition to follow up with primary care. Prior to initiation of steroids I discussed this with him and his wife, given his rheumatologic history.   Carmin Muskrat, MD 09/27/13 2016

## 2013-09-27 NOTE — ED Notes (Signed)
Pt refused to ride in wheelchair to room

## 2013-09-27 NOTE — Discharge Instructions (Signed)
As discussed, your evaluation is largely reassuring.  It is important that you use your albuterol inhaler every 4 hours for the next 2 days in addition to the provided medication.  Please be sure to follow up with your primary care physician.

## 2013-09-27 NOTE — ED Notes (Signed)
Pt went to [place] [facility] in [place]-- has been getting more short of breath past 3-4 nights when laying back in chair, nonproductive cough, dr at [facility] wanted pt to follow up at an ER to R/O PE-- also called cardiologist, who told pt to come here. Pt has also been hoarse, started yesterday

## 2013-10-29 ENCOUNTER — Other Ambulatory Visit: Payer: Self-pay

## 2013-11-01 ENCOUNTER — Encounter: Payer: Self-pay | Admitting: Internal Medicine

## 2013-11-01 ENCOUNTER — Ambulatory Visit (INDEPENDENT_AMBULATORY_CARE_PROVIDER_SITE_OTHER): Payer: Medicare Other | Admitting: Internal Medicine

## 2013-11-01 VITALS — BP 143/82 | HR 69 | Ht 75.0 in | Wt 270.2 lb

## 2013-11-01 DIAGNOSIS — I480 Paroxysmal atrial fibrillation: Secondary | ICD-10-CM

## 2013-11-01 DIAGNOSIS — I509 Heart failure, unspecified: Secondary | ICD-10-CM

## 2013-11-01 DIAGNOSIS — I4892 Unspecified atrial flutter: Secondary | ICD-10-CM

## 2013-11-01 DIAGNOSIS — I4891 Unspecified atrial fibrillation: Secondary | ICD-10-CM

## 2013-11-01 DIAGNOSIS — I5032 Chronic diastolic (congestive) heart failure: Secondary | ICD-10-CM

## 2013-11-01 DIAGNOSIS — G4733 Obstructive sleep apnea (adult) (pediatric): Secondary | ICD-10-CM

## 2013-11-01 DIAGNOSIS — I484 Atypical atrial flutter: Secondary | ICD-10-CM

## 2013-11-01 DIAGNOSIS — I4819 Other persistent atrial fibrillation: Secondary | ICD-10-CM

## 2013-11-01 DIAGNOSIS — R0602 Shortness of breath: Secondary | ICD-10-CM

## 2013-11-01 MED ORDER — FLECAINIDE ACETATE 100 MG PO TABS: 300.0000 mg | ORAL_TABLET | ORAL | Status: DC

## 2013-11-01 NOTE — Progress Notes (Signed)
PCP: Garnett Farm, MD Primary Cardiologist:  Stanford Breed Primary EP:  Kenzel Ruesch is a 68 y.o. male who presents today for routine electrophysiology followup. He did have DCCV in February following a hospitalization for N/V and diarrhea and mild dehydration. Since last being seen in our clinic, the patient has had only one episode of afib with RVR, which converted with the patient taking 300 mg of flecainide as a one time dose.  Otherwise no afib complaints but is symptomatic with exertional dyspnea.  In January, he saw his PCP for routine exam, who was concerned with his symptoms. He advised for him to go to the ER for fear of PE.Marland Kitchen Chest xray showed low lung volumes with resultant pulmonary interstitial disease/copd. Also mild rt hemidiaphragmatic elevation. He was started on inhalers which he uses infrequently. CT of chest in 2009 also showed minimal ground glass densities in both lungs as well as some evidence for coronary artery disease. PFT's were done remotely, no report  found.   Today, he denies symptoms of palpitations, chest pain, shortness of breath,  lower extremity edema, dizziness, presyncope, or syncope.  The patient is otherwise without complaint today.   Past Medical History  Diagnosis Date  . Paroxysmal atrial fibrillation     a. 11/2003 Tikosyn initiated - subsequently d/c'd;  b. 2006 RFCA for Afib @ [facility];  c. 09/2011 Echo: EF 60-65%, Gr 2 DD;  d. 10/2011 DCCV  . Psoriasis   . Dyspepsia   . Deafness   . Kidney stones   . Hyperlipidemia   . Macular degeneration     pt reports that he actually has a "retina problem' not MD  . Pneumonia 2009    in context of Flu  . Atypical chest pain     a. 06/2003 Cath:  nl cors;  b. 11/2006 MV: no ischemia, nl LV  . Cataract   . COPD (chronic obstructive pulmonary disease)   . Diverticulosis of colon (without mention of hemorrhage) 1999, 2006  . Hiatal hernia 1999  . Esophagitis 1999  . Adenomatous polyp of colon 2006  .  Arthritis   . Skin cancer     on leg, left arm   Past Surgical History  Procedure Laterality Date  . Rotator cuff repair    . Atrial fibrillation ablation  2006    Dr Rolland Porter at [facility]  . Eye surgery Bilateral     cataracts with lens implants  . Appendectomy  1996  . Tonsillectomy    . Cardioversion N/A 05/22/2013    Procedure: CARDIOVERSION;  Surgeon: [name], MD;  Location: [facility] OR;  Service: Cardiovascular;  Laterality: N/A;  . Nasal sinus surgery      x 2    Current Outpatient Prescriptions  Medication Sig Dispense Refill  . albuterol (PROVENTIL HFA;VENTOLIN HFA) 108 (90 BASE) MCG/ACT inhaler Inhale into the lungs every 6 (six) hours as needed for wheezing or shortness of breath.      . B Complex-C (SUPER B COMPLEX) TABS Take 1 tablet by mouth daily.      . budesonide-formoterol (SYMBICORT) 160-4.5 MCG/ACT inhaler Inhale 2 puffs into the lungs as directed.  1 Inhaler  0  . cetirizine (ZYRTEC) 10 MG chewable tablet Chew 10 mg by mouth daily.      Marland Kitchen diltiazem (CARDIZEM CD) 120 MG 24 hr capsule Take 1 capsule (120 mg total) by mouth daily.  30 capsule  6  . etanercept (ENBREL) 50 MG/ML injection Inject 50 mg  into the skin every Friday.      . flecainide (TAMBOCOR) 100 MG tablet Take 3 tablets (300 mg total) by mouth as directed. Take 300mg  if heart rate reached 140 beats per minute and monitor heart rate for 3 hours.  If not returned to normal-go to [facility] Emergency Room  15 tablet  11  . folic acid (FOLVITE) 1 MG tablet Take 1 mg by mouth daily.      . Magnesium 400 MG CAPS Take 400 mg by mouth daily.       . methotrexate (RHEUMATREX) 2.5 MG tablet Take 12.5 mg by mouth every Tuesday. Caution:Chemotherapy. Protect from light.      . metoprolol tartrate (LOPRESSOR) 25 MG tablet Take 0.5 tablets (12.5 mg total) by mouth 2 (two) times daily.  30 tablet  3  . omeprazole (PRILOSEC) 20 MG capsule Take 20 mg by mouth 2 (two) times daily before a meal.       . Probiotic Product (ALIGN)  4 MG CAPS Take 4 mg by mouth daily.      . Rivaroxaban (XARELTO) 20 MG TABS tablet Take 1 tablet (20 mg total) by mouth daily.  90 tablet  2  . zolpidem (AMBIEN) 10 MG tablet Take 10 mg by mouth at bedtime as needed for sleep.        No current facility-administered medications for this visit.   Facility-Administered Medications Ordered in Other Visits  Medication Dose Route Frequency Provider Last Rate Last Dose  . lidocaine (cardiac) 100 mg/75ml (XYLOCAINE) 20 MG/ML injection 2%    Anesthesia Intra-op Rokoshi T Flowers, CRNA   60 mg at 03/25/13 1617  . propofol (DIPRIVAN) 10 mg/mL bolus/IV push    Anesthesia Intra-op Durwin Glaze Flowers, CRNA   90 mg at 03/25/13 1617    Physical Exam: Filed Vitals:   11/01/13 0955  BP: 143/82  Pulse: 69  Height: 6\' 3"  (1.905 m)  Weight: 122.562 kg (270 lb 3.2 oz)    GEN- The patient is well appearing, alert and oriented x 3 today.   Head- normocephalic, atraumatic Eyes-  Sclera clear, conjunctiva pink Ears- hearing intact Oropharynx- clear Lungs- Fine dry crackles in bases bilaterally, consistent with interstitial  disease. Heart- Regular rate and rhythm, no murmurs, rubs or gallops, PMI not laterally displaced GI- soft, NT, ND, + BS Extremities- no clubbing, cyanosis, or edema  ekg today reveals Sr with marked sinus arrythmia at 69 bpm, otherwise normal EKG.  Assessment and Plan:  1. Afib/ atypical atrial flutter  Maintaining NSR with minimal afib. Has flecainide  to use as a pill in the pocket.  As episodes of afib/ atypical atrial flutter remain infrequent, he would prefer to continue his present strategy and defer ablation.  If his episodes increase in frequency then he may be more willing to consider ablation at that time. His CHADS2VASC score is 2( HTN and age) and will continue xarelto at this time.  2.  Worsening dyspnea with exertion Known COPD, with past lung studies suggesting interstitial disease, as well as prior CT in '09 showing  evidence for CAD. Weight loss recommended. Encouraged to use inhalers. PFTs Echo Stress myoview, holding metoprolol. He would prefer to try to walk on a treadmill, though he has not been able to do so in the past. Consider sleep study in future if continues to be symptomatic  Return in 3 months, since patient prefers less follow-up rather than more. Aware he will be called back to office, or appropriate  referral made,  sooner if tests abnormal.   I have seen, examined the patient, and reviewed the above assessment and plan with Roderic Palau NP.  Changes to above are made where necessary.    Co Sign: [name], MD 11/02/2013 7:57 PM

## 2013-11-01 NOTE — Patient Instructions (Signed)
Your physician recommends that you schedule a follow-up appointment in: 3 months with Roderic Palau, NP in [place] clinic  Your physician has requested that you have an echocardiogram. Echocardiography is a painless test that uses sound waves to create images of your heart. It provides your doctor with information about the size and shape of your heart and how well your heart's chambers and valves are working. This procedure takes approximately one hour. There are no restrictions for this procedure.   Your physician has requested that you have en exercise stress myoview. For further information please visit [email or web address]. Please follow instruction sheet, as given.  Your physician has recommended that you have a pulmonary function test. Pulmonary Function Tests are a group of tests that measure how well air moves in and out of your lungs.

## 2013-11-03 ENCOUNTER — Other Ambulatory Visit: Payer: Self-pay | Admitting: *Deleted

## 2013-11-03 MED ORDER — FLECAINIDE ACETATE 100 MG PO TABS: 300.0000 mg | ORAL_TABLET | ORAL | Status: DC

## 2013-11-03 MED ORDER — FLECAINIDE ACETATE 100 MG PO TABS: ORAL_TABLET | ORAL | Status: DC

## 2013-11-10 ENCOUNTER — Encounter: Payer: Self-pay | Admitting: Cardiology

## 2013-11-15 ENCOUNTER — Ambulatory Visit (HOSPITAL_COMMUNITY): Payer: Medicare Other | Attending: Cardiology | Admitting: Radiology

## 2013-11-15 ENCOUNTER — Ambulatory Visit (HOSPITAL_BASED_OUTPATIENT_CLINIC_OR_DEPARTMENT_OTHER): Payer: Medicare Other | Admitting: Radiology

## 2013-11-15 VITALS — BP 134/73 | Ht 75.0 in | Wt 262.0 lb

## 2013-11-15 DIAGNOSIS — I10 Essential (primary) hypertension: Secondary | ICD-10-CM | POA: Diagnosis not present

## 2013-11-15 DIAGNOSIS — I4891 Unspecified atrial fibrillation: Secondary | ICD-10-CM | POA: Insufficient documentation

## 2013-11-15 DIAGNOSIS — I509 Heart failure, unspecified: Secondary | ICD-10-CM | POA: Diagnosis not present

## 2013-11-15 DIAGNOSIS — J4489 Other specified chronic obstructive pulmonary disease: Secondary | ICD-10-CM | POA: Insufficient documentation

## 2013-11-15 DIAGNOSIS — E785 Hyperlipidemia, unspecified: Secondary | ICD-10-CM | POA: Diagnosis not present

## 2013-11-15 DIAGNOSIS — J449 Chronic obstructive pulmonary disease, unspecified: Secondary | ICD-10-CM | POA: Insufficient documentation

## 2013-11-15 DIAGNOSIS — I5031 Acute diastolic (congestive) heart failure: Secondary | ICD-10-CM | POA: Insufficient documentation

## 2013-11-15 DIAGNOSIS — R0609 Other forms of dyspnea: Secondary | ICD-10-CM

## 2013-11-15 DIAGNOSIS — R0989 Other specified symptoms and signs involving the circulatory and respiratory systems: Secondary | ICD-10-CM

## 2013-11-15 DIAGNOSIS — R0602 Shortness of breath: Secondary | ICD-10-CM | POA: Diagnosis present

## 2013-11-15 DIAGNOSIS — I491 Atrial premature depolarization: Secondary | ICD-10-CM

## 2013-11-15 DIAGNOSIS — I480 Paroxysmal atrial fibrillation: Secondary | ICD-10-CM

## 2013-11-15 DIAGNOSIS — Z87891 Personal history of nicotine dependence: Secondary | ICD-10-CM | POA: Diagnosis not present

## 2013-11-15 HISTORY — PX: TRANSTHORACIC ECHOCARDIOGRAM: SHX275

## 2013-11-15 MED ORDER — TECHNETIUM TC 99M SESTAMIBI GENERIC - CARDIOLITE
11.0000 | Freq: Once | INTRAVENOUS | Status: AC | PRN
  Administered 2013-11-15: 11 via INTRAVENOUS

## 2013-11-15 MED ORDER — TECHNETIUM TC 99M SESTAMIBI GENERIC - CARDIOLITE
33.0000 | Freq: Once | INTRAVENOUS | Status: AC | PRN
  Administered 2013-11-15: 33 via INTRAVENOUS

## 2013-11-15 NOTE — Progress Notes (Signed)
Echocardiogram performed.  

## 2013-11-15 NOTE — Progress Notes (Signed)
[facility] NUCLEAR MED [address] [place], [place] [address] [phone]    Cardiology Nuclear Med Study  Kenneth [name] is a 68 y.o. male     MRN : [id number]     DOB: 01-27-1946  Procedure Date: 11/15/2013  Nuclear Med Background Indication for Stress Test:  Evaluation for Ischemia History:  COPD and No H/O CAD 2008 MPI: NL EF: 58% ECHO: EF: 60-65% 2013 AFIB RVR  DCCV in FEB Cardiac Risk Factors: History of Smoking, Hypertension and Lipids  Symptoms:  DOE and Palpitations   Nuclear Pre-Procedure Caffeine/Decaff Intake:  None NPO After: 2:00am   Lungs:  clear O2 Sat: 96% on room air. IV 0.9% NS with Angio Cath:  22g  IV Site: R Hand  IV Started by:  Matilde Haymaker, RN  Chest Size (in):  52" Cup Size: n/a  Height: 6\' 3"  (1.905 m)  Weight:  262 lb (118.842 kg)  BMI:  Body mass index is 32.75 kg/(m^2). Tech Comments:  No Lopressor x 24 hrs    Nuclear Med Study 1 or 2 day study: 1 day  Stress Test Type:  Stress  Reading MD: n/a  Order Authorizing Provider:  Trude Mcburney  Resting Radionuclide: Technetium 10m Sestamibi  Resting Radionuclide Dose: 11.0 mCi   Stress Radionuclide:  Technetium 51m Sestamibi  Stress Radionuclide Dose: 33.0 mCi           Stress Protocol Rest HR: 74 Stress HR: 139  Rest BP: 134/76 Stress BP: 217/71  Exercise Time (min): 5:00 METS: 7.0   Predicted Max HR: 152 bpm % Max HR: 91.45 bpm Rate Pressure Product: 30163   Dose of Adenosine (mg):  n/a Dose of Lexiscan: n/a mg  Dose of Atropine (mg): n/a Dose of Dobutamine: n/a mcg/kg/min (at max HR)  Stress Test Technologist: Perrin Maltese, EMT-P  Nuclear Technologist:  Vedia Pereyra, CNMT     Rest Procedure:  Myocardial perfusion imaging was performed at rest 45 minutes following the intravenous administration of Technetium 32m Sestamibi. Rest ECG: NSR - Normal EKG  Stress Procedure:  The patient exercised on the treadmill utilizing the Bruce Protocol for 5:00  minutes. The patient stopped due to fatigue, sob, and denied any chest pain.  Technetium 2m Sestamibi was injected at peak exercise and myocardial perfusion imaging was performed after a brief delay. Stress ECG: No significant ST segment change suggestive of ischemia.  QPS Raw Data Images:  Acquisition technically good; normal left ventricular size. Stress Images:  Normal homogeneous uptake in all areas of the myocardium. Rest Images:  Normal homogeneous uptake in all areas of the myocardium. Subtraction (SDS):  No evidence of ischemia. Transient Ischemic Dilatation (Normal <1.22):  1.12 Lung/Heart Ratio (Normal <0.45):  0.45  Quantitative Gated Spect Images QGS EDV:  92 ml QGS ESV:  41 ml  Impression Exercise Capacity:  Fair exercise capacity. BP Response:  Hypertensive blood pressure response. Clinical Symptoms:  There is dyspnea. ECG Impression:  No significant ST segment change suggestive of ischemia. Comparison with Prior Nuclear Study: No significant change from previous study  Overall Impression:  Normal stress nuclear study.  LV Ejection Fraction: 56%.  LV Wall Motion:  NL LV Function; NL Wall Motion  [name]

## 2013-12-08 ENCOUNTER — Telehealth: Payer: Self-pay | Admitting: Internal Medicine

## 2013-12-08 DIAGNOSIS — G4733 Obstructive sleep apnea (adult) (pediatric): Secondary | ICD-10-CM

## 2013-12-08 DIAGNOSIS — R0602 Shortness of breath: Secondary | ICD-10-CM

## 2013-12-08 NOTE — Telephone Encounter (Signed)
Pt's wife called because she states pt was scheduled for a PFT and he was not able to complete because of a bronchitis. He was seen by his PCP he prescribed antibiotics for 7 to 10 days, that Prednisone for 6 days , he still has the bronchitis. He coughs  badly and gets out of breath, he feels  like he is chocking. The cough gets worse in the evenings. Wife would like for pt to be referrer to a [facility] for an appointment. Dr Rayann Heman aware of wife and pt's request for a referral to Pulmonary medicine. MD agrees to referrer  pt with Dr [facility]. Ambulatory Referral with pulmonary was place in [facility]. Pt's wife is aware. PCC's notified.

## 2013-12-08 NOTE — Telephone Encounter (Signed)
New Message  Pt wife called states that the pt is not getting any better may need to see a pulmonary specialtist. Request a call back to discuss all options. please call.Marland Kitchen

## 2013-12-23 ENCOUNTER — Encounter: Payer: Self-pay | Admitting: Pulmonary Disease

## 2013-12-23 ENCOUNTER — Ambulatory Visit (INDEPENDENT_AMBULATORY_CARE_PROVIDER_SITE_OTHER): Payer: Medicare Other | Admitting: Pulmonary Disease

## 2013-12-23 VITALS — BP 140/81 | HR 81 | Temp 98.5°F | Ht 75.0 in | Wt 271.0 lb

## 2013-12-23 DIAGNOSIS — G4733 Obstructive sleep apnea (adult) (pediatric): Secondary | ICD-10-CM

## 2013-12-23 DIAGNOSIS — J8409 Other alveolar and parieto-alveolar conditions: Secondary | ICD-10-CM

## 2013-12-23 DIAGNOSIS — R0602 Shortness of breath: Secondary | ICD-10-CM

## 2013-12-23 NOTE — Patient Instructions (Signed)
Breathing test CT scan of chest Stay on symbicort -(rinse mouth after) & methotrexate for now Call us if you get a sinus infection

## 2013-12-23 NOTE — Progress Notes (Signed)
Subjective:    Patient ID: Kenneth [name], male    DOB: September 20, 1945, 68 y.o.   MRN: [id number]  HPI  [facility]P- [facility]  68 year old remote smoker presents for evaluation of dyspnea and repeated episodes of  Chest congestion. He has atrial fibrillation for 15 years and is maintained on Cardizem and metoprolol. He takes flecainide if his heart rate gets too high. He has psoriasis that is well-controlled on Enbrel and methotrexate weekly. He has been on methotrexate for 3years with good results and normal LFTs. He reports increased chest congestion for the past few weeks, he was treated with Z-Pak and prednisone with some relief. He reports a constant sinus drip and a dry cough. I note a CT angiogram from 09/2007 which shows bilateral groundglass opacities but a repeat CT in 10/1998 and shows resolution of these infiltrates. Myocardial perfusion imaging on 11/2011 was normal. Chest x-ray on 09/28/11 showed mild cardiomegaly and low lung volumes with interstitial prominence. He denies significant dyspnea and exertion but admits to a sedentary lifestyle. Is no history of edema, orthopnea or paroxysmal nocturnal dyspnea. He worked for a [employer] and is now retired, denies any symptoms of arthritis or skin rash He smokes cigars quit about 20 years ago  He reports a diagnosis of obstructive sleep apnea at the [place] years ago, but is not interested in pursuing this further   Past Medical History  Diagnosis Date  . Paroxysmal atrial fibrillation     a. 11/2003 Tikosyn initiated - subsequently d/c'd;  b. 2006 RFCA for Afib @ [facility];  c. 09/2011 Echo: EF 60-65%, Gr 2 DD;  d. 10/2011 DCCV  . Psoriasis   . Dyspepsia   . Deafness   . Kidney stones   . Hyperlipidemia   . Macular degeneration     pt reports that he actually has a "retina problem' not MD  . Pneumonia 2009    in context of Flu  . Atypical chest pain     a. 06/2003 Cath:  nl cors;  b. 11/2006 MV: no ischemia, nl LV  . Cataract   . COPD (chronic  obstructive pulmonary disease)   . Diverticulosis of colon (without mention of hemorrhage) 1999, 2006  . Hiatal hernia 1999  . Esophagitis 1999  . Adenomatous polyp of colon 2006  . Arthritis   . Skin cancer     on leg, left arm    Past Surgical History  Procedure Laterality Date  . Rotator cuff repair    . Atrial fibrillation ablation  2006    Dr Rolland Porter at [facility]  . Eye surgery Bilateral     cataracts with lens implants  . Appendectomy  1996  . Tonsillectomy    . Cardioversion N/A 05/22/2013    Procedure: CARDIOVERSION;  Surgeon: [name], MD;  Location: [facility] OR;  Service: Cardiovascular;  Laterality: N/A;  . Nasal sinus surgery      x 2    Allergies  Allergen Reactions  . Hydrocodone-Acetaminophen Shortness Of Breath  . Other Palpitations and Other (See Comments)    ALL DECONGESTANTS - CAUSE PALPITATIONS; THROWS HEART RHYTHM OUT OF BALANCE  . Oxycodone-Acetaminophen Shortness Of Breath  . Pseudoephedrine Palpitations  . Tramadol Shortness Of Breath    flushing    History   Social History  . Marital Status: Married    Spouse Name: N/A    Number of Children: 1  . Years of Education: N/A   Occupational History  . Retired Armed forces operational officer  Social History Main Topics  . Smoking status: Former Smoker -- 10 years    Types: Cigars    Quit date: 09/29/2001  . Smokeless tobacco: Former Systems developer    Quit date: 09/29/2001  . Alcohol Use: No  . Drug Use: No  . Sexual Activity: Not on file   Other Topics Concern  . Not on file   Social History Narrative   Pt lives in [place] with spouse.     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    Family History  Problem Relation Age of Onset  . Coronary artery disease Mother   . Breast cancer Mother   . Colon cancer Mother   . Heart disease Maternal Grandmother     MI  . Stroke Paternal Grandfather     CVA  . Coronary artery disease Paternal Grandfather   . Esophageal cancer Neg Hx   . Stomach cancer Neg  Hx   . Rectal cancer Neg Hx      Review of Systems  Constitutional: Negative for fever and unexpected weight change.  HENT: Positive for congestion, ear pain, sneezing and trouble swallowing. Negative for dental problem, nosebleeds, postnasal drip, rhinorrhea, sinus pressure and sore throat.   Eyes: Negative for redness and itching.  Respiratory: Positive for shortness of breath. Negative for cough, chest tightness and wheezing.   Cardiovascular: Positive for palpitations and leg swelling.  Gastrointestinal: Negative for nausea and vomiting.  Genitourinary: Negative for dysuria.  Musculoskeletal: Negative for joint swelling.  Skin: Negative for rash.  Neurological: Negative for headaches.  Hematological: Does not bruise/bleed easily.  Psychiatric/Behavioral: Negative for dysphoric mood. The patient is not nervous/anxious.        Objective:   Physical Exam  Gen. Pleasant, obese, in no distress, normal affect ENT - no lesions, no post nasal drip, class 2-3 airway Neck: No JVD, no thyromegaly, no carotid bruits Lungs: no use of accessory muscles, no dullness to percussion, left basal fine rales, no rhonchi  Cardiovascular: Rhythm regular, heart sounds  normal, no murmurs or gallops, no peripheral edema Abdomen: soft and non-tender, no hepatosplenomegaly, BS normal. Musculoskeletal: No deformities, no cyanosis or clubbing Neuro:  alert, non focal, no tremors       Assessment & Plan:

## 2013-12-24 NOTE — Assessment & Plan Note (Addendum)
His chest x-rays nondiagnostic, he does have basal crackles on exam. His history may be consistent with early interstitial lung disease. If so. This may be related to methotrexate. Her to proceed with a high-resolution CT scan without contrast. If this is normal, then would attribute her symptoms to reflux or postnasal drip. He could certainly be prone to repeated episodes of bronchitis due to immunosuppression and Enbrel  I doubt that he has significant obstructive lung disease. We'll obtain pulmonary function test and clarify need for Symbicort in the future

## 2013-12-24 NOTE — Assessment & Plan Note (Signed)
He does not desire further evaluation for this at this time

## 2013-12-28 ENCOUNTER — Ambulatory Visit (INDEPENDENT_AMBULATORY_CARE_PROVIDER_SITE_OTHER)
Admission: RE | Admit: 2013-12-28 | Discharge: 2013-12-28 | Disposition: A | Discharge status: Home / Self Care | Payer: Medicare Other | Source: Ambulatory Visit | Attending: Pulmonary Disease | Admitting: Pulmonary Disease

## 2013-12-28 DIAGNOSIS — R0602 Shortness of breath: Secondary | ICD-10-CM

## 2014-01-03 ENCOUNTER — Ambulatory Visit (INDEPENDENT_AMBULATORY_CARE_PROVIDER_SITE_OTHER): Payer: Medicare Other

## 2014-01-03 ENCOUNTER — Ambulatory Visit: Payer: Medicare Other

## 2014-01-03 ENCOUNTER — Encounter (INDEPENDENT_AMBULATORY_CARE_PROVIDER_SITE_OTHER): Payer: Medicare Other

## 2014-01-03 DIAGNOSIS — Z23 Encounter for immunization: Secondary | ICD-10-CM

## 2014-01-03 DIAGNOSIS — R0602 Shortness of breath: Secondary | ICD-10-CM

## 2014-01-05 ENCOUNTER — Telehealth: Payer: Self-pay | Admitting: Internal Medicine

## 2014-01-05 NOTE — Telephone Encounter (Signed)
Spoke with patient and let him know it looked as though someone had given him these results on 8/144.  He said he never got them.  I went over both test and told him he was due to see Roderic Palau, NP in 3 months which would be Oct.  He has an appointment with Dr Rayann Heman 10/26.  I will make sure with Lenna Sciara this is the correct schedule

## 2014-01-05 NOTE — Telephone Encounter (Signed)
Patient had NUC and ECHO in Aug and wanted to know if someone could give him a call regarding the results and possible ablation. Should he schedule a f/u appointment?

## 2014-01-12 ENCOUNTER — Encounter: Payer: Self-pay | Admitting: *Deleted

## 2014-01-12 ENCOUNTER — Encounter: Payer: Self-pay | Admitting: Internal Medicine

## 2014-01-12 ENCOUNTER — Ambulatory Visit (INDEPENDENT_AMBULATORY_CARE_PROVIDER_SITE_OTHER): Payer: Medicare Other | Admitting: Internal Medicine

## 2014-01-12 ENCOUNTER — Telehealth: Payer: Self-pay | Admitting: Internal Medicine

## 2014-01-12 VITALS — BP 132/80 | HR 141 | Ht 75.0 in | Wt 268.6 lb

## 2014-01-12 DIAGNOSIS — I480 Paroxysmal atrial fibrillation: Secondary | ICD-10-CM

## 2014-01-12 DIAGNOSIS — I484 Atypical atrial flutter: Secondary | ICD-10-CM

## 2014-01-12 DIAGNOSIS — R0602 Shortness of breath: Secondary | ICD-10-CM

## 2014-01-12 DIAGNOSIS — I4819 Other persistent atrial fibrillation: Secondary | ICD-10-CM

## 2014-01-12 DIAGNOSIS — I481 Persistent atrial fibrillation: Secondary | ICD-10-CM

## 2014-01-12 LAB — BASIC METABOLIC PANEL
BUN: 14 mg/dL (ref 6–23)
CALCIUM: 9.4 mg/dL (ref 8.4–10.5)
CO2: 24 meq/L (ref 19–32)
CREATININE: 1 mg/dL (ref 0.4–1.5)
Chloride: 104 mEq/L (ref 96–112)
GFR: 82.68 mL/min (ref >60.00)
Glucose, Bld: 128 mg/dL — ABNORMAL HIGH (ref 70–99)
Potassium: 3.9 mEq/L (ref 3.5–5.1)
Sodium: 137 mEq/L (ref 135–145)

## 2014-01-12 LAB — CBC WITH DIFFERENTIAL/PLATELET
BASOS PCT: 0.7 % (ref 0.0–3.0)
Basophils Absolute: 0.1 10*3/uL (ref 0.0–0.1)
Eosinophils Absolute: 0.2 10*3/uL (ref 0.0–0.7)
Eosinophils Relative: 3.3 % (ref 0.0–5.0)
HCT: 45.8 % (ref 39.0–52.0)
HEMOGLOBIN: 15.3 g/dL (ref 13.0–17.0)
LYMPHS PCT: 24.1 % (ref 12.0–46.0)
Lymphs Abs: 1.8 10*3/uL (ref 0.7–4.0)
MCHC: 33.4 g/dL (ref 30.0–36.0)
MCV: 88.8 fl (ref 78.0–100.0)
MONOS PCT: 9.2 % (ref 3.0–12.0)
Monocytes Absolute: 0.7 10*3/uL (ref 0.1–1.0)
NEUTROS PCT: 62.7 % (ref 43.0–77.0)
Neutro Abs: 4.8 10*3/uL (ref 1.4–7.7)
Platelets: 199 10*3/uL (ref 150.0–400.0)
RBC: 5.16 Mil/uL (ref 4.22–5.81)
RDW: 14.8 % (ref 11.5–15.5)
WBC: 7.7 10*3/uL (ref 4.0–10.5)

## 2014-01-12 MED ORDER — FLECAINIDE ACETATE 100 MG PO TABS: ORAL_TABLET | ORAL | Status: DC

## 2014-01-12 NOTE — Progress Notes (Signed)
PCP: Garnett Farm, MD Primary Cardiologist:  Xylon Croom is a 68 y.o. male who presents today for urgent evaluation reporting a rapid heart beat since 9pm last night without any relief taking pill in a pocket flecainide 300 mg. He reports 3 episodes in the last 4-6 weeks but SR would resume after as needed flecainide. He also finished a prednisone taper within the last week and as off Monday had BB d/ced due to possible effects on lungs and breathing. He had a myoview done qhich was normal in the recent past and a chest ct with pending pulmonary f/u. Recent echo showed NML EF 65-70%,mild LVH, left atrium mild to mod dilated 48 mm. He has taken xarelto consistently. EKG today shows atypical atrial flutter at 141 bpm.   Today, positive for symptoms of palpitations,  No chest pain, shortness of breath,  lower extremity edema, dizziness, presyncope, or syncope.  The patient is otherwise without complaint today.   Past Medical History  Diagnosis Date  . Paroxysmal atrial fibrillation     a. 11/2003 Tikosyn initiated - subsequently d/c'd;  b. 2006 RFCA for Afib @ [facility];  c. 09/2011 Echo: EF 60-65%, Gr 2 DD;  d. 10/2011 DCCV  . Psoriasis   . Dyspepsia   . Deafness   . Kidney stones   . Hyperlipidemia   . Macular degeneration     pt reports that he actually has a "retina problem' not MD  . Pneumonia 2009    in context of Flu  . Atypical chest pain     a. 06/2003 Cath:  nl cors;  b. 11/2006 MV: no ischemia, nl LV  . Cataract   . COPD (chronic obstructive pulmonary disease)   . Diverticulosis of colon (without mention of hemorrhage) 1999, 2006  . Hiatal hernia 1999  . Esophagitis 1999  . Adenomatous polyp of colon 2006  . Arthritis   . Skin cancer     on leg, left arm   Past Surgical History  Procedure Laterality Date  . Rotator cuff repair    . Atrial fibrillation ablation  2006    Dr Rolland Porter at [facility]  . Eye surgery Bilateral     cataracts with lens implants  . Appendectomy   1996  . Tonsillectomy    . Cardioversion N/A 05/22/2013    Procedure: CARDIOVERSION;  Surgeon: [name], MD;  Location: [facility] OR;  Service: Cardiovascular;  Laterality: N/A;  . Nasal sinus surgery      x 2    Current Outpatient Prescriptions  Medication Sig Dispense Refill  . albuterol (PROVENTIL HFA;VENTOLIN HFA) 108 (90 BASE) MCG/ACT inhaler Inhale into the lungs every 6 (six) hours as needed for wheezing or shortness of breath.      . ALPRAZolam (XANAX) 0.5 MG tablet Take 0.5 mg by mouth every 6 (six) hours as needed for anxiety.      . budesonide-formoterol (SYMBICORT) 160-4.5 MCG/ACT inhaler Inhale 2 puffs into the lungs as directed.  1 Inhaler  0  . cetirizine (ZYRTEC) 10 MG tablet Take 10 mg by mouth daily.      . chlorpheniramine (CHLOR-TRIMETON) 4 MG tablet Take 4 mg by mouth at bedtime and may repeat dose one time if needed.      . diltiazem (DILTIAZEM CD) 180 MG 24 hr capsule Take 180 mg by mouth daily.      Marland Kitchen etanercept (ENBREL) 50 MG/ML injection Inject 50 mg into the skin every Friday.      Marland Kitchen  flecainide (TAMBOCOR) 100 MG tablet Take 300mg  if heart rate reaches 140 beats per minute and monitor heart rate for 3 hours, if not returned to normal-go to [facility] ER  15 tablet  11  . folic acid (FOLVITE) 1 MG tablet Take 1 mg by mouth daily.      . Magnesium 400 MG CAPS Take 400 mg by mouth daily.       . methotrexate (RHEUMATREX) 2.5 MG tablet Take 12.5 mg by mouth every Tuesday. Caution:Chemotherapy. Protect from light.      Marland Kitchen omeprazole (PRILOSEC) 20 MG capsule Take 20 mg by mouth 2 (two) times daily before a meal.       . Probiotic Product (ALIGN) 4 MG CAPS Take 4 mg by mouth daily.      . Rivaroxaban (XARELTO) 20 MG TABS tablet Take 1 tablet (20 mg total) by mouth daily.  90 tablet  2  . zolpidem (AMBIEN) 10 MG tablet Take 10 mg by mouth at bedtime as needed for sleep.        No current facility-administered medications for this visit.   Facility-Administered Medications  Ordered in Other Visits  Medication Dose Route Frequency Provider Last Rate Last Dose  . lidocaine (cardiac) 100 mg/29ml (XYLOCAINE) 20 MG/ML injection 2%    Anesthesia Intra-op Rokoshi T Flowers, CRNA   60 mg at 03/25/13 1617  . propofol (DIPRIVAN) 10 mg/mL bolus/IV push    Anesthesia Intra-op Durwin Glaze Flowers, CRNA   90 mg at 03/25/13 1617    Physical Exam: Filed Vitals:   01/12/14 1036  BP: 132/80  Pulse: 141  Height: 6\' 3"  (1.905 m)  Weight: 268 lb 9.6 oz (121.836 kg)    GEN- The patient is well appearing, alert and oriented x 3 today.   Head- normocephalic, atraumatic Eyes-  Sclera clear, conjunctiva pink Ears- hearing intact Oropharynx- clear Lungs- Fine dry crackles in bases bilaterally, consistent with interstitial  disease. Heart- Rapid, regular rate and rhythm, no murmurs, rubs or gallops, PMI not laterally displaced GI- soft, NT, ND, + BS Extremities- no clubbing, cyanosis, or edema  See hpe for results echo, ekg and recent stres test. ekg reveals atypical atrial flutter with 2:1 AV conduction  Assessment and Plan:  1. Afib/ atypical atrial flutter Therapeutic strategies for recurrent afib and atypical atrial flutter including medicine and ablation were discussed in detail with the patient today. Risk, benefits, and alternatives to EP study and radiofrequency ablation were also discussed in detail today. These risks include but are not limited to stroke, bleeding, vascular damage, tamponade, perforation, damage to the esophagus, lungs, and other structures, pulmonary vein stenosis, radiation exposure worsening renal function, and death. The patient understands these risk and wishes to proceed.  We will therefore proceed with catheter ablation tomorrow without TEE since pt is adequately anticoagulated and has been in aflutter less than 24 hours at this point.Marland Kitchen He understands to take xarelto tonight.  2.  Worsening dyspnea with exertion Known COPD, with past lung studies  suggesting mild interstitial disease, Has f/u with pulmonologist tomorrow but will reschedule to a later date as we will proceed with urgent ablation.

## 2014-01-12 NOTE — Telephone Encounter (Signed)
New message     Pt says his heart is out of rhythum.  Heart rate was 161 last night.  Wife gave him 3 flecainide and it went down to 121.  This am still have tachycardia--heart rate is 141.  Should he go to ER, give 3 more flecainide or come to our office?

## 2014-01-12 NOTE — Telephone Encounter (Signed)
Seen in office today and set up for ablation tomorrow

## 2014-01-12 NOTE — Patient Instructions (Signed)
Your physician has recommended that you have an ablation. Catheter ablation is a medical procedure used to treat some cardiac arrhythmias (irregular heartbeats). During catheter ablation, a long, thin, flexible tube is put into a blood vessel in your groin (upper thigh), or neck. This tube is called an ablation catheter. It is then guided to your heart through the blood vessel. Radio frequency waves destroy small areas of heart tissue where abnormal heartbeats may cause an arrhythmia to start. Please see the instruction sheet given to you today.     Cardiac Ablation Cardiac ablation is a procedure to disable a small amount of heart tissue in very specific places. The heart has many electrical connections. Sometimes these connections are abnormal and can cause the heart to beat very fast or irregularly. By disabling some of the problem areas, heart rhythm can be improved or made normal. Ablation is done for people who:   Have Wolff-Parkinson-White syndrome.   Have other fast heart rhythms (tachycardia).   Have taken medicines for an abnormal heart rhythm (arrhythmia) that resulted in:   No success.   Side effects.   May have a high-risk heartbeat that could result in death.  LET [facility] CARE PROVIDER KNOW ABOUT:   Any allergies you have or any previous reactions you have had to X-ray dye, food (such as seafood), medicine, or tape.   All medicines you are taking, including vitamins, herbs, eye drops, creams, and over-the-counter medicines.   Previous problems you or members of your family have had with the use of anesthetics.   Any blood disorders you have.   Previous surgeries or procedures (such as a kidney transplant) you have had.   Medical conditions you have (such as kidney failure).  RISKS AND COMPLICATIONS Generally, cardiac ablation is a safe procedure. However, problems can occur and include:   Increased risk of cancer. Depending on how long it takes to do  the ablation, the dose of radiation can be high.  Bruising and bleeding where a thin, flexible tube (catheter) was inserted during the procedure.   Bleeding into the chest, especially into the sac that surrounds the heart (serious).  Need for a permanent pacemaker if the normal electrical system is damaged.   The procedure may not be fully effective, and this may not be recognized for months. Repeat ablation procedures are sometimes required. BEFORE THE PROCEDURE   Follow any instructions from your health care provider regarding eating and drinking before the procedure.   Take your medicines as directed at regular times with water, unless instructed otherwise by your health care provider. If you are taking diabetes medicine, including insulin, ask how you are to take it and if there are any special instructions you should follow. It is common to adjust insulin dosing the day of the ablation.  PROCEDURE  An ablation is usually performed in a catheterization laboratory with the guidance of fluoroscopy. Fluoroscopy is a type of X-ray that helps your health care provider see images of your heart during the procedure.   An ablation is a minimally invasive procedure. This means a small cut (incision) is made in either your neck or groin. Your health care provider will decide where to make the incision based on your medical history and physical exam.  An IV tube will be started before the procedure begins. You will be given an anesthetic or medicine to help you relax (sedative).  The skin on your neck or groin will be numbed. A needle will be inserted  into a large vein in your neck or groin and catheters will be threaded to your heart.  A special dye that shows up on fluoroscopy pictures may be injected through the catheter. The dye helps your health care provider see the area of the heart that needs treatment.  The catheter has electrodes on the tip. When the area of heart tissue that is  causing the arrhythmia is found, the catheter tip will send an electrical current to the area and "scar" the tissue. Three types of energy can be used to ablate the heart tissue:   Heat (radiofrequency energy).   Laser energy.   Extreme cold (cryoablation).   When the area of the heart has been ablated, the catheter will be taken out. Pressure will be held on the insertion site. This will help the insertion site clot and keep it from bleeding. A bandage will be placed on the insertion site.  AFTER THE PROCEDURE   After the procedure, you will be taken to a recovery area where your vital signs (blood pressure, heart rate, and breathing) will be monitored. The insertion site will also be monitored for bleeding.   You will need to lie still for 4-6 hours. This is to ensure you do not bleed from the catheter insertion site.  Document Released: 08/11/2008 Document Revised: 08/09/2013 Document Reviewed: 08/17/2012 [facility] Patient Information 2015 [place], [place]. This information is not intended to replace advice given to you by your health care provider. Make sure you discuss any questions you have with your health care provider.

## 2014-01-13 ENCOUNTER — Ambulatory Visit: Payer: Medicare Other | Admitting: Pulmonary Disease

## 2014-01-13 ENCOUNTER — Encounter (HOSPITAL_COMMUNITY): Payer: Medicare Other | Admitting: Anesthesiology

## 2014-01-13 ENCOUNTER — Encounter (HOSPITAL_COMMUNITY)
Admission: RE | Disposition: A | Discharge status: Home / Self Care | Payer: Self-pay | Source: Ambulatory Visit | Attending: Internal Medicine

## 2014-01-13 ENCOUNTER — Ambulatory Visit (HOSPITAL_COMMUNITY): Payer: Medicare Other | Admitting: Anesthesiology

## 2014-01-13 ENCOUNTER — Encounter (HOSPITAL_COMMUNITY): Payer: Self-pay

## 2014-01-13 ENCOUNTER — Ambulatory Visit (HOSPITAL_COMMUNITY)
Admission: RE | Admit: 2014-01-13 | Discharge: 2014-01-14 | Disposition: A | Discharge status: Home / Self Care | Payer: Medicare Other | Source: Ambulatory Visit | Attending: Internal Medicine | Admitting: Internal Medicine

## 2014-01-13 DIAGNOSIS — G4730 Sleep apnea, unspecified: Secondary | ICD-10-CM | POA: Insufficient documentation

## 2014-01-13 DIAGNOSIS — L409 Psoriasis, unspecified: Secondary | ICD-10-CM | POA: Diagnosis not present

## 2014-01-13 DIAGNOSIS — Z79899 Other long term (current) drug therapy: Secondary | ICD-10-CM | POA: Diagnosis not present

## 2014-01-13 DIAGNOSIS — I4892 Unspecified atrial flutter: Secondary | ICD-10-CM | POA: Diagnosis present

## 2014-01-13 DIAGNOSIS — K219 Gastro-esophageal reflux disease without esophagitis: Secondary | ICD-10-CM | POA: Insufficient documentation

## 2014-01-13 DIAGNOSIS — E785 Hyperlipidemia, unspecified: Secondary | ICD-10-CM | POA: Insufficient documentation

## 2014-01-13 DIAGNOSIS — Z6833 Body mass index (BMI) 33.0-33.9, adult: Secondary | ICD-10-CM | POA: Diagnosis not present

## 2014-01-13 DIAGNOSIS — I480 Paroxysmal atrial fibrillation: Secondary | ICD-10-CM

## 2014-01-13 DIAGNOSIS — I10 Essential (primary) hypertension: Secondary | ICD-10-CM | POA: Diagnosis not present

## 2014-01-13 DIAGNOSIS — R0789 Other chest pain: Secondary | ICD-10-CM | POA: Insufficient documentation

## 2014-01-13 DIAGNOSIS — Z87891 Personal history of nicotine dependence: Secondary | ICD-10-CM | POA: Insufficient documentation

## 2014-01-13 DIAGNOSIS — J449 Chronic obstructive pulmonary disease, unspecified: Secondary | ICD-10-CM | POA: Insufficient documentation

## 2014-01-13 DIAGNOSIS — I484 Atypical atrial flutter: Secondary | ICD-10-CM | POA: Diagnosis not present

## 2014-01-13 DIAGNOSIS — R931 Abnormal findings on diagnostic imaging of heart and coronary circulation: Secondary | ICD-10-CM

## 2014-01-13 DIAGNOSIS — Z7901 Long term (current) use of anticoagulants: Secondary | ICD-10-CM | POA: Diagnosis not present

## 2014-01-13 DIAGNOSIS — I4891 Unspecified atrial fibrillation: Secondary | ICD-10-CM | POA: Diagnosis present

## 2014-01-13 HISTORY — PX: ATRIAL FIBRILLATION ABLATION: SHX5456

## 2014-01-13 LAB — POCT ACTIVATED CLOTTING TIME
ACTIVATED CLOTTING TIME: 247 s
ACTIVATED CLOTTING TIME: 253 s
ACTIVATED CLOTTING TIME: 253 s
Activated Clotting Time: 214 seconds
Activated Clotting Time: 191 seconds
Activated Clotting Time: 168 seconds

## 2014-01-13 SURGERY — ATRIAL FIBRILLATION ABLATION
Anesthesia: Monitor Anesthesia Care

## 2014-01-13 MED ORDER — PROPOFOL INFUSION 10 MG/ML OPTIME
INTRAVENOUS | Status: DC | PRN
  Administered 2014-01-13 (×2): via INTRAVENOUS
  Administered 2014-01-13: 100 ug/kg/min via INTRAVENOUS
  Administered 2014-01-13 (×3): via INTRAVENOUS

## 2014-01-13 MED ORDER — SODIUM CHLORIDE 0.9 % IV SOLN: 250.0000 mL | INTRAVENOUS | Status: DC | PRN

## 2014-01-13 MED ORDER — MIDAZOLAM HCL 5 MG/5ML IJ SOLN
INTRAMUSCULAR | Status: DC | PRN
  Administered 2014-01-13: 2 mg via INTRAVENOUS

## 2014-01-13 MED ORDER — ZOLPIDEM TARTRATE 5 MG PO TABS: 10.0000 mg | ORAL_TABLET | Freq: Every evening | ORAL | Status: DC | PRN

## 2014-01-13 MED ORDER — MEPERIDINE HCL 25 MG/ML IJ SOLN: 6.2500 mg | INTRAMUSCULAR | Status: DC | PRN

## 2014-01-13 MED ORDER — SODIUM CHLORIDE 0.9 % IJ SOLN
3.0000 mL | Freq: Two times a day (BID) | INTRAMUSCULAR | Status: DC
  Administered 2014-01-13: 3 mL via INTRAVENOUS

## 2014-01-13 MED ORDER — LIDOCAINE HCL (CARDIAC) 20 MG/ML IV SOLN
INTRAVENOUS | Status: DC | PRN
  Administered 2014-01-13: 30 mg via INTRAVENOUS

## 2014-01-13 MED ORDER — HEPARIN SODIUM (PORCINE) 1000 UNIT/ML IJ SOLN
INTRAMUSCULAR | Status: DC | PRN
  Administered 2014-01-13: 2 mL via INTRAVENOUS
  Administered 2014-01-13: 3 mL via INTRAVENOUS
  Administered 2014-01-13: 12 mL via INTRAVENOUS

## 2014-01-13 MED ORDER — MIDAZOLAM HCL 2 MG/2ML IJ SOLN: 0.5000 mg | Freq: Once | INTRAMUSCULAR | Status: DC | PRN

## 2014-01-13 MED ORDER — ALBUTEROL SULFATE (2.5 MG/3ML) 0.083% IN NEBU: 3.0000 mL | INHALATION_SOLUTION | Freq: Four times a day (QID) | RESPIRATORY_TRACT | Status: DC | PRN

## 2014-01-13 MED ORDER — ONDANSETRON HCL 4 MG/2ML IJ SOLN
INTRAMUSCULAR | Status: DC | PRN
Start: 2014-01-13 — End: 2014-01-13
  Administered 2014-01-13: 4 mg via INTRAVENOUS

## 2014-01-13 MED ORDER — PROMETHAZINE HCL 25 MG/ML IJ SOLN: 6.2500 mg | INTRAMUSCULAR | Status: DC | PRN

## 2014-01-13 MED ORDER — FOLIC ACID 1 MG PO TABS
1.0000 mg | ORAL_TABLET | Freq: Every day | ORAL | Status: DC
  Administered 2014-01-13: 1 mg via ORAL
  Filled 2014-01-13 (×2): qty 1

## 2014-01-13 MED ORDER — BUPIVACAINE HCL (PF) 0.25 % IJ SOLN
INTRAMUSCULAR | Status: AC
  Filled 2014-01-13: qty 30

## 2014-01-13 MED ORDER — HEPARIN SODIUM (PORCINE) 1000 UNIT/ML IJ SOLN
INTRAMUSCULAR | Status: AC
  Filled 2014-01-13: qty 1

## 2014-01-13 MED ORDER — ONDANSETRON HCL 4 MG/2ML IJ SOLN: 4.0000 mg | Freq: Four times a day (QID) | INTRAMUSCULAR | Status: DC | PRN

## 2014-01-13 MED ORDER — ALPRAZOLAM 0.5 MG PO TABS: 0.5000 mg | ORAL_TABLET | Freq: Four times a day (QID) | ORAL | Status: DC | PRN

## 2014-01-13 MED ORDER — LORATADINE 10 MG PO TABS
10.0000 mg | ORAL_TABLET | Freq: Every day | ORAL | Status: DC
  Administered 2014-01-13: 10 mg via ORAL
  Filled 2014-01-13 (×2): qty 1

## 2014-01-13 MED ORDER — ACETAMINOPHEN 500 MG PO TABS: 500.0000 mg | ORAL_TABLET | Freq: Once | ORAL | Status: DC

## 2014-01-13 MED ORDER — RIVAROXABAN 20 MG PO TABS
20.0000 mg | ORAL_TABLET | Freq: Every day | ORAL | Status: DC
  Administered 2014-01-13: 20 mg via ORAL
  Filled 2014-01-13 (×2): qty 1

## 2014-01-13 MED ORDER — DILTIAZEM HCL ER COATED BEADS 180 MG PO CP24
180.0000 mg | ORAL_CAPSULE | Freq: Every day | ORAL | Status: DC
  Administered 2014-01-13: 180 mg via ORAL
  Filled 2014-01-13 (×2): qty 1

## 2014-01-13 MED ORDER — ZOLPIDEM TARTRATE 5 MG PO TABS: 5.0000 mg | ORAL_TABLET | Freq: Every evening | ORAL | Status: DC | PRN

## 2014-01-13 MED ORDER — PANTOPRAZOLE SODIUM 40 MG PO TBEC
40.0000 mg | DELAYED_RELEASE_TABLET | Freq: Every day | ORAL | Status: DC
  Administered 2014-01-13: 40 mg via ORAL
  Filled 2014-01-13: qty 1

## 2014-01-13 MED ORDER — SODIUM CHLORIDE 0.9 % IJ SOLN: 3.0000 mL | INTRAMUSCULAR | Status: DC | PRN

## 2014-01-13 MED ORDER — SODIUM CHLORIDE 0.9 % IV SOLN
2.0000 ug/min | INTRAVENOUS | Status: AC
  Administered 2014-01-13: 10 ug/min via INTRAVENOUS
  Filled 2014-01-13: qty 2

## 2014-01-13 MED ORDER — BUDESONIDE-FORMOTEROL FUMARATE 160-4.5 MCG/ACT IN AERO
2.0000 | INHALATION_SPRAY | Freq: Two times a day (BID) | RESPIRATORY_TRACT | Status: DC
  Filled 2014-01-13: qty 6

## 2014-01-13 MED ORDER — FENTANYL CITRATE 0.05 MG/ML IJ SOLN: 25.0000 ug | INTRAMUSCULAR | Status: DC | PRN

## 2014-01-13 MED ORDER — SODIUM CHLORIDE 0.9 % IV SOLN
INTRAVENOUS | Status: DC | PRN
  Administered 2014-01-13: 08:00:00 via INTRAVENOUS

## 2014-01-13 MED ORDER — FENTANYL CITRATE 0.05 MG/ML IJ SOLN
INTRAMUSCULAR | Status: DC | PRN
  Administered 2014-01-13 (×4): 25 ug via INTRAVENOUS
  Administered 2014-01-13: 50 ug via INTRAVENOUS
  Administered 2014-01-13: 25 ug via INTRAVENOUS

## 2014-01-13 NOTE — Discharge Summary (Signed)
ELECTROPHYSIOLOGY PROCEDURE DISCHARGE SUMMARY    Patient ID: Kenneth [name],  MRN: [id number], DOB/AGE: June 04, 1945 68 y.o.  Admit date: 01/13/2014 Discharge date: 01/14/2014  Primary Care Physician: Garnett Farm, MD Primary Cardiologist: Stanford Breed Electrophysiologist: Thomas Hoff, MD  Primary Discharge Diagnosis:  Paroxysmal atrial fibrillation and atypical atrial flutter status post ablation this admission  Secondary Discharge Diagnosis:  1.  Atrial fibrillation - status post PVI by Dr Rolland Porter at [facility] 2006 2.  Hyperlipidemia 3.  COPD 4.  Atypical chest pain - cath 2005 with normal coronaries  Procedures This Admission:  1.  Electrophysiology study and radiofrequency catheter ablation on 01-13-2014 by Dr [name].  This study demonstrated left atrial flutter upon presentation (CL 220 msec); rotational Angiography reveals a moderate sized left atrium with four separate pulmonary veins without evidence of pulmonary vein stenosis; successful ablation of left atrial flutter as above; return of conduction within the right superior and inferior pulmonary veins. The left superior and inferior pulmonary veins were quiescent from a prior ablation procedure; successful electrical re-isolation and anatomical encircling of the right superior and inferior pulmonary veins with radiofrequency current (WACA approach); cavo-tricuspid isthmus ablation previously performed and not therefore ablated today; no inducible arrhythmias following ablation both on and off of Isuprel. There were no early apparent complications.   Brief HPI: Kenneth [name] is a 68 y.o. male with a history of paroxysmal atrial fibrillation. He underwent previous ablation by Dr Rolland Porter in 2006.  Since then he has had intermittent breakthrough palpitations that are terminated with prn Flecainide.  Risks, benefits, and alternatives to catheter ablation of atrial fibrillation were reviewed with the patient who wished to  proceed.  The patient underwent TEE prior to the procedure which demonstrated normal LV function and no LAA thrombus.    Hospital Course:  The patient was admitted and underwent EPS/RFCA of atrial fibrillation and atypical atrial flutter with details as outlined above.  They were monitored on telemetry overnight which demonstrated sinus rhythm.  Groin was without complication on the day of discharge.  The patient was examined by Dr Rayann Heman and considered to be stable for discharge.  Wound care and restrictions were reviewed with the patient.  The patient will be seen back by Dr Rayann Heman in 12 weeks for post ablation follow up.  He had mild pleuritic chest pain prior to discharge.  Limited echo at that time revealed no effusion and normal LV function.  Discharge Vitals: Blood pressure 148/73, pulse 102, temperature 99.3 F (37.4 C), temperature source Oral, resp. rate 28, height 6\' 3"  (1.905 m), weight 268 lb 9 oz (121.819 kg), SpO2 93.00%.  Physical Exam: Filed Vitals:   01/13/14 2100 01/14/14 0017 01/14/14 0429 01/14/14 0756  BP: 123/67 107/87 121/66 148/73  Pulse: 92 84 91 102  Temp:  97.6 F (36.4 C) 98.5 F (36.9 C) 99.3 F (37.4 C)  TempSrc:  Oral Oral Oral  Resp: 21 20 21 28   Height:      Weight:      SpO2: 94% 95% 93% 93%    GEN- The patient is well appearing, alert and oriented x 3 today.   Head- normocephalic, atraumatic Eyes-  Sclera clear, conjunctiva pink Ears- hearing intact Oropharynx- clear Neck- supple, Lungs- Clear to ausculation bilaterally, normal work of breathing Heart- Regular rate and rhythm, no murmurs, rubs or gallops, PMI not laterally displaced GI- soft, NT, ND, + BS Extremities- no clubbing, cyanosis, or edema, groin is without hematoma/ bruit MS- no significant  deformity or atrophy Skin- no rash or lesion Psych- euthymic mood, full affect Neuro- strength and sensation are intact   Labs:   Lab Results  Component Value Date   WBC 7.7 01/12/2014   HGB  15.3 01/12/2014   HCT 45.8 01/12/2014   MCV 88.8 01/12/2014   PLT 199.0 01/12/2014     Recent Labs Lab 01/14/14 0300  NA 141  K 4.3  CL 102  CO2 23  BUN 12  CREATININE 1.01  CALCIUM 8.6  GLUCOSE 128*      Discharge Medications:    Medication List         acetaminophen 500 MG tablet  Commonly known as:  TYLENOL  Take 500 mg by mouth once.     albuterol 108 (90 BASE) MCG/ACT inhaler  Commonly known as:  PROVENTIL HFA;VENTOLIN HFA  Inhale 1 puff into the lungs every 6 (six) hours as needed for wheezing or shortness of breath.     ALIGN 4 MG Caps  Take 4 mg by mouth daily.     ALPRAZolam 0.5 MG tablet  Commonly known as:  XANAX  Take 0.5 mg by mouth every 6 (six) hours as needed for anxiety.     budesonide-formoterol 160-4.5 MCG/ACT inhaler  Commonly known as:  SYMBICORT  Inhale 2 puffs into the lungs daily as needed.     cetirizine 10 MG tablet  Commonly known as:  ZYRTEC  Take 10 mg by mouth daily.     CHLOR-TRIMETON 4 MG tablet  Generic drug:  chlorpheniramine  Take 4 mg by mouth daily as needed.     DILTIAZEM CD 180 MG 24 hr capsule  Generic drug:  diltiazem  Take 180 mg by mouth daily.     etanercept 50 MG/ML injection  Commonly known as:  ENBREL  Inject 50 mg into the skin every Friday.     flecainide 100 MG tablet  Commonly known as:  TAMBOCOR  Take 300 mg by mouth once. Take 300mg  if heart rate reaches 140 beats per minute and monitor heart rate for 3 hours, if not returned to normal-go to [facility]     folic acid 1 MG tablet  Commonly known as:  FOLVITE  Take 1 mg by mouth daily.     Magnesium 400 MG Caps  Take 400 mg by mouth 2 (two) times daily.     methotrexate 2.5 MG tablet  Commonly known as:  RHEUMATREX  Take 12.5 mg by mouth every Tuesday. Caution:Chemotherapy. Protect from light.     omeprazole 20 MG capsule  Commonly known as:  PRILOSEC  Take 20 mg by mouth 2 (two) times daily before a meal.     rivaroxaban 20 MG Tabs  tablet  Commonly known as:  XARELTO  Take 20 mg by mouth daily with supper.     zolpidem 10 MG tablet  Commonly known as:  AMBIEN  Take 10 mg by mouth at bedtime as needed for sleep.        Disposition:   Follow-up Information   Follow up with Roderic Palau, NP On 02/01/2014. (3:30)    Specialty:  Nurse Practitioner   Contact information:   [place] [address] [phone]       Follow up with [name], MD On 04/25/2014. (11:30)    Specialty:  Cardiology   Contact information:   [address] [place] [address] [phone]       Duration of Discharge Encounter:  Greater than 30 minutes including physician time.  Signed, [name] MD

## 2014-01-13 NOTE — Anesthesia Postprocedure Evaluation (Signed)
  Anesthesia Post-op Note  Patient: Kenneth [name]  Procedure(s) Performed: Procedure(s): ATRIAL FIBRILLATION ABLATION (N/A)  Patient Location: Cath Lab  Anesthesia Type:MAC  Level of Consciousness: awake, alert , oriented and patient cooperative  Airway and Oxygen Therapy: Patient Spontanous Breathing  Post-op Pain: none  Post-op Assessment: Post-op Vital signs reviewed, Patient's Cardiovascular Status Stable, Respiratory Function Stable, Patent Airway, No signs of Nausea or vomiting and Pain level controlled  Post-op Vital Signs: Reviewed and stable  Last Vitals:  Filed Vitals:   01/13/14 1200  BP: 129/81  Pulse: 86  Temp:   Resp: 14    Complications: No apparent anesthesia complications

## 2014-01-13 NOTE — Anesthesia Preprocedure Evaluation (Addendum)
Anesthesia Evaluation  Patient identified by MRN, date of birth, ID band Patient awake    Reviewed: Allergy & Precautions, H&P , NPO status , Patient's Chart, lab work & pertinent test results  History of Anesthesia Complications Negative for: history of anesthetic complications  Airway Mallampati: I TM Distance: >3 FB Neck ROM: Full    Dental  (+) Dental Advisory Given   Pulmonary sleep apnea (mild, no CPAP) , COPD COPD inhaler, former smoker,  breath sounds clear to auscultation        Cardiovascular hypertension, Pt. on medications - angina+ dysrhythmias Atrial Fibrillation Rhythm:Irregular Rate:Normal  09/2011 Echo: EF 20-35%, Gr 2 diastolic dysfunction '05 cath: normal coronaries 8/15 myoview: normal   Neuro/Psych negative neurological ROS     GI/Hepatic Neg liver ROS, GERD-  Medicated and Controlled,  Endo/Other  Morbid obesity  Renal/GU negative Renal ROS     Musculoskeletal   Abdominal (+) + obese,   Peds  Hematology   Anesthesia Other Findings   Reproductive/Obstetrics                         Anesthesia Physical Anesthesia Plan  ASA: III  Anesthesia Plan: MAC   Post-op Pain Management:    Induction: Intravenous  Airway Management Planned: Simple Face Mask and Natural Airway  Additional Equipment:   Intra-op Plan:   Post-operative Plan:   Informed Consent: I have reviewed the patients History and Physical, chart, labs and discussed the procedure including the risks, benefits and alternatives for the proposed anesthesia with the patient or authorized representative who has indicated his/her understanding and acceptance.   Dental advisory given  Plan Discussed with: CRNA and Surgeon  Anesthesia Plan Comments: (Plan routine monitors, MAC)        Anesthesia Quick Evaluation

## 2014-01-13 NOTE — Progress Notes (Signed)
Family in to see. 

## 2014-01-13 NOTE — Progress Notes (Signed)
Rt femoral venous sheaths X 3 removed by DCarter.

## 2014-01-13 NOTE — Progress Notes (Signed)
Rt groin redressed.  Bedrest started again at 1500.  No hematoma/rt groin. Manual pressure applied for rebleed for an additional 20 min.  Rt groin level 0.  IV in the lt hand is saline locked per MD order.

## 2014-01-13 NOTE — Transfer of Care (Signed)
Immediate Anesthesia Transfer of Care Note  Patient: Kenneth [name]  Procedure(s) Performed: Procedure(s): ATRIAL FIBRILLATION ABLATION (N/A)  Patient Location: PACU, Cath lab  Anesthesia Type:MAC  Level of Consciousness: awake, patient cooperative and responds to stimulation  Airway & Oxygen Therapy: Patient Spontanous Breathing  Post-op Assessment: Report given to PACU RN and Post -op Vital signs reviewed and stable  Post vital signs: Reviewed and stable  Complications: No apparent anesthesia complications

## 2014-01-13 NOTE — H&P (View-Only) (Signed)
PCP: Garnett Farm, MD Primary Cardiologist:  Kenneth [name] is a 68 y.o. male who presents today for urgent evaluation reporting a rapid heart beat since 9pm last night without any relief taking pill in a pocket flecainide 300 mg. He reports 3 episodes in the last 4-6 weeks but SR would resume after as needed flecainide. He also finished a prednisone taper within the last week and as off Monday had BB d/ced due to possible effects on lungs and breathing. He had a myoview done qhich was normal in the recent past and a chest ct with pending pulmonary f/u. Recent echo showed NML EF 65-70%,mild LVH, left atrium mild to mod dilated 48 mm. He has taken xarelto consistently. EKG today shows atypical atrial flutter at 141 bpm.   Today, positive for symptoms of palpitations,  No chest pain, shortness of breath,  lower extremity edema, dizziness, presyncope, or syncope.  The patient is otherwise without complaint today.   Past Medical History  Diagnosis Date  . Paroxysmal atrial fibrillation     a. 11/2003 Tikosyn initiated - subsequently d/c'd;  b. 2006 RFCA for Afib @ [facility];  c. 09/2011 Echo: EF 60-65%, Gr 2 DD;  d. 10/2011 DCCV  . Psoriasis   . Dyspepsia   . Deafness   . Kidney stones   . Hyperlipidemia   . Macular degeneration     pt reports that he actually has a "retina problem' not MD  . Pneumonia 2009    in context of Flu  . Atypical chest pain     a. 06/2003 Cath:  nl cors;  b. 11/2006 MV: no ischemia, nl LV  . Cataract   . COPD (chronic obstructive pulmonary disease)   . Diverticulosis of colon (without mention of hemorrhage) 1999, 2006  . Hiatal hernia 1999  . Esophagitis 1999  . Adenomatous polyp of colon 2006  . Arthritis   . Skin cancer     on leg, left arm   Past Surgical History  Procedure Laterality Date  . Rotator cuff repair    . Atrial fibrillation ablation  2006    Dr Rolland Porter at [facility]  . Eye surgery Bilateral     cataracts with lens implants  . Appendectomy   1996  . Tonsillectomy    . Cardioversion N/A 05/22/2013    Procedure: CARDIOVERSION;  Surgeon: [name], MD;  Location: [facility] OR;  Service: Cardiovascular;  Laterality: N/A;  . Nasal sinus surgery      x 2    Current Outpatient Prescriptions  Medication Sig Dispense Refill  . albuterol (PROVENTIL HFA;VENTOLIN HFA) 108 (90 BASE) MCG/ACT inhaler Inhale into the lungs every 6 (six) hours as needed for wheezing or shortness of breath.      . ALPRAZolam (XANAX) 0.5 MG tablet Take 0.5 mg by mouth every 6 (six) hours as needed for anxiety.      . budesonide-formoterol (SYMBICORT) 160-4.5 MCG/ACT inhaler Inhale 2 puffs into the lungs as directed.  1 Inhaler  0  . cetirizine (ZYRTEC) 10 MG tablet Take 10 mg by mouth daily.      . chlorpheniramine (CHLOR-TRIMETON) 4 MG tablet Take 4 mg by mouth at bedtime and may repeat dose one time if needed.      . diltiazem (DILTIAZEM CD) 180 MG 24 hr capsule Take 180 mg by mouth daily.      Marland Kitchen etanercept (ENBREL) 50 MG/ML injection Inject 50 mg into the skin every Friday.      Marland Kitchen  flecainide (TAMBOCOR) 100 MG tablet Take 300mg  if heart rate reaches 140 beats per minute and monitor heart rate for 3 hours, if not returned to normal-go to [facility] ER  15 tablet  11  . folic acid (FOLVITE) 1 MG tablet Take 1 mg by mouth daily.      . Magnesium 400 MG CAPS Take 400 mg by mouth daily.       . methotrexate (RHEUMATREX) 2.5 MG tablet Take 12.5 mg by mouth every Tuesday. Caution:Chemotherapy. Protect from light.      Marland Kitchen omeprazole (PRILOSEC) 20 MG capsule Take 20 mg by mouth 2 (two) times daily before a meal.       . Probiotic Product (ALIGN) 4 MG CAPS Take 4 mg by mouth daily.      . Rivaroxaban (XARELTO) 20 MG TABS tablet Take 1 tablet (20 mg total) by mouth daily.  90 tablet  2  . zolpidem (AMBIEN) 10 MG tablet Take 10 mg by mouth at bedtime as needed for sleep.        No current facility-administered medications for this visit.   Facility-Administered Medications  Ordered in Other Visits  Medication Dose Route Frequency Provider Last Rate Last Dose  . lidocaine (cardiac) 100 mg/74ml (XYLOCAINE) 20 MG/ML injection 2%    Anesthesia Intra-op Rokoshi T Flowers, CRNA   60 mg at 03/25/13 1617  . propofol (DIPRIVAN) 10 mg/mL bolus/IV push    Anesthesia Intra-op Durwin Glaze Flowers, CRNA   90 mg at 03/25/13 1617    Physical Exam: Filed Vitals:   01/12/14 1036  BP: 132/80  Pulse: 141  Height: 6\' 3"  (1.905 m)  Weight: 268 lb 9.6 oz (121.836 kg)    GEN- The patient is well appearing, alert and oriented x 3 today.   Head- normocephalic, atraumatic Eyes-  Sclera clear, conjunctiva pink Ears- hearing intact Oropharynx- clear Lungs- Fine dry crackles in bases bilaterally, consistent with interstitial  disease. Heart- Rapid, regular rate and rhythm, no murmurs, rubs or gallops, PMI not laterally displaced GI- soft, NT, ND, + BS Extremities- no clubbing, cyanosis, or edema  See hpe for results echo, ekg and recent stres test. ekg reveals atypical atrial flutter with 2:1 AV conduction  Assessment and Plan:  1. Afib/ atypical atrial flutter Therapeutic strategies for recurrent afib and atypical atrial flutter including medicine and ablation were discussed in detail with the patient today. Risk, benefits, and alternatives to EP study and radiofrequency ablation were also discussed in detail today. These risks include but are not limited to stroke, bleeding, vascular damage, tamponade, perforation, damage to the esophagus, lungs, and other structures, pulmonary vein stenosis, radiation exposure worsening renal function, and death. The patient understands these risk and wishes to proceed.  We will therefore proceed with catheter ablation tomorrow without TEE since pt is adequately anticoagulated and has been in aflutter less than 24 hours at this point.Marland Kitchen He understands to take xarelto tonight.  2.  Worsening dyspnea with exertion Known COPD, with past lung studies  suggesting mild interstitial disease, Has f/u with pulmonologist tomorrow but will reschedule to a later date as we will proceed with urgent ablation.

## 2014-01-13 NOTE — Progress Notes (Signed)
Site area: rt groin Site Prior to Removal:  Level 0 Pressure Applied For: 20 minutes, removed by DCarter Manual:   yes Patient Status During Pull:  stable Post Pull Site:  Level 0 Post Pull Instructions Given:  yes Post Pull Pulses Present: yes Dressing Applied:  yes Bedrest begins @ 7121 Comments: no complications

## 2014-01-13 NOTE — Interval H&P Note (Signed)
History and Physical Interval Note:  01/13/2014 7:33 AM  Kenneth [name]  has presented today for surgery, with the diagnosis of afib   The various methods of treatment have been discussed with the patient and family. After consideration of risks, benefits and other options for treatment, the patient has consented to  Procedure(s): ATRIAL FIBRILLATION ABLATION (N/A) as a surgical intervention .  The patient's history has been reviewed, patient examined, no change in status, stable for surgery.  I have reviewed the patient's chart and labs.  Questions were answered to the patient's satisfaction.     [name]

## 2014-01-13 NOTE — Op Note (Signed)
SURGEON:  [name], MD  PREPROCEDURE DIAGNOSES: 1. Paroxysmal atrial fibrillation. 2. Atypical atrial flutter  POSTPROCEDURE DIAGNOSES: 1. Paroxysmal  atrial fibrillation. 2. Left atrial flutter  PROCEDURES: 1. Comprehensive electrophysiologic study. 2. Coronary sinus pacing and recording. 3. Three-dimensional mapping of atrial fibrillation with additional mapping and ablation of a second discrete focus (left atrial flutter) 4. Ablation of atrial fibrillation with additional mapping and ablation of a second discrete focus (left atrial flutter) 5. Intracardiac echocardiography. 6. Transseptal puncture of an intact septum. 7. Rotational Angiography with processing at an independent workstation 8. Arrhythmia induction with pacing with isuprel infusion  INTRODUCTION:  Kenneth [name] is a 68 y.o. male with a history of paroxysmal atrial fibrillation and recurrent atypical appearing atrial flutter who now presents for EP study and radiofrequency ablation.  The patient reports initially being diagnosed with atrial fibrillation years ago after presenting with symptomatic palpitations and fatgiue.  He underwent atrial fibrillation ablation at [facility] in 2006.  He has done well since ablation.  Recently, he has developed recurrent episodes of atypical atrial flutter.  His most recent episode occurred yesterday and persisted despite medical therapy with flecainide.  He reports compliance with his Xarelto.  The patient therefore presents today for catheter ablation of atrial fibrillation and atrial flutter.  DESCRIPTION OF PROCEDURE:  Informed written consent was obtained, and the patient was brought to the electrophysiology lab in a fasting state.  The patient was adequately sedated with intravenous medications as outlined in the anesthesia report.  The patient's left and right groins were prepped and draped in the usual sterile fashion by the EP lab staff.  Using a percutaneous Seldinger technique, two  7-French and one 11-French hemostasis sheaths were placed into the right common femoral vein.    3 Dimensional Rotational Angiography: A 5 french pigtail catheter was introduced through the right common femoral vein and advanced into the inferior venocava.  3 demential rotational angiography was then performed by power injection of 100cc of nonionic contrast.  Reprocessing at an independent work station was then performed.   This demonstrated a moderate sized left atrium with 4 separate pulmonary veins which were also moderate in size.  There were no anomalous veins or significant abnormalities.  A 3 dimensional rendering of the left atrium was then merged using [employer] onto the [employer] system and registered with intracardiac echo (see below).  The pigtail catheter was then removed.  Catheter Placement:  A 7-French Biosense Webster Decapolar coronary sinus catheter was introduced through the right common femoral vein and advanced into the coronary sinus for recording and pacing from this location.  A quadrapolar catheter was introduced through the right common femoral vein and advanced into the right ventricle for recording and pacing.  This catheter was then pulled back to the His bundle location.    Initial Measurements: The patient presented to the electrophysiology lab in atypical atrial flutter.   The patients QRS duration was  104 msec and a QT interval of 327 msec.  The  HV interval measured 52 msec.   The atrial flutter cycle length was slightly proximal to distal with a cycle length of 220 msec.    Intracardiac Echocardiography: A 10-French Biosense Webster AcuNav intracardiac echocardiography catheter was introduced through the left common femoral vein and advanced into the right atrium. Intracardiac echocardiography was performed of the left atrium, and a three-dimensional anatomical rendering of the left atrium was performed using CARTO sound technology.  The patient was  noted  to have a moderate sized left atrium.  The interatrial septum was prominent but not aneurysmal. All 4 pulmonary veins were visualized and noted to have separate ostia.  The pulmonary veins were moderate in size.  The left atrial appendage was visualized in multiple veiws and did not reveal thrombus.   There was no evidence of pulmonary vein stenosis.   Transseptal Puncture: The middle right common femoral vein sheath was exchanged for an 8.5 [place] SL2 transseptal sheath and transseptal access was achieved in a standard fashion using a Brockenbrough needle under biplane fluoroscopy with intracardiac echocardiography confirmation of the transseptal puncture.  Once transseptal access had been achieved, heparin was administered intravenously and intra- arterially in order to maintain an ACT of greater than 300 seconds throughout the procedure.   3D Mapping and Ablation: A 3.39mm Biosense Webster SmartTouch Thermocool ablation catheter was advanced into the right atrium.  Entrainment mapping was performed from the far left atrium as well as the CTI.  The PPI was much greater than the TCL in both locations.  3 D activation mapping during atrial flutter was performed from the right atrium  The earlierst activation was recorded near the septal roof.  Only a small fraction of the TCL was mapped from the R atrium.  The patient was therefore felt to have left atrial flutter.  3D mapping was therefore performed within the left atrium using a Biosense Webster adjustable Nav circular catheter.  This demonstrated that the earilest activations arose near the carina of the right inferior and superior pulmonary veins.  There was prodigious conduction within both veins.  The left superior and inferior pulmonary veins were quiescent from the prior ablation procedure.  Three-dimensional electroanatomical mapping was performed using CARTO technology.  The patient had a small fossa ovale and therefore I could not achieve  double transeptal access.  The circular catheter was therefore removed and the SmartTouch catheter was placed through the transseptal sheeth into the L atrium.  Ablation was performed at the carina between the right superior and inferio pulmonary veins.  This lesion was then directed posterolaterally and towards the LA roof.  Tachycardia slowed and then terminated during ablation.  Extensive ablation was performed across this line.  The R superior and inferior pulmonary veins were then re-isolated using a WACA approach.     The ablation catheter was then pulled back into the right atrial and positioned along the cavo-tricuspid isthmus.  Mapping along the atrial side of the isthmus was performed.  This demonstrated ablation from a prior procedure with a stim to earliest atrial activation recorded birectionally acrosss the isthmus measuring 130 msec.    Measurements Following Ablation: Following ablation, Isuprel was infused up to 10 mcg/min with no inducible atrial fibrillation, atrial tachycardia, atrial flutter, or sustained PACs. In sinus rhythm with RR interval was 529 msec, with PR 172 msec, QRS 79 msec, and Qt 320 msec.  Following ablation the AH interval measured 80 msec with an HV interval of 59 msec. Ventricular pacing was performed, which revealed VA dissociation when pacing at 600 msec.  Rapid atrial pacing was performed, which revealed an AV Wenckebach cycle length of 350 msec.  Electroisolation was then again confirmed in all four pulmonary veins.  Pacing was performed within the right superior and inferior pulmonary veins to confirm exit block.  The L superior and inferior pulmonary veins were confirmed to be quiescent.  Intracardiac echocardiography was again performed, which revealed no pericardial effusion.  The procedure was therefore considered completed.  All catheters were removed, and the sheaths were aspirated and flushed.  The patient was transferred to the recovery area for sheath  removal per protocol.  A limited bedside transthoracic echocardiogram revealed no pericardial effusion. EBL<24ml.  There were no early apparent complications.  CONCLUSIONS: 1. Left atrial flutter upon presentation (CL 220 msec).   2. Rotational Angiography reveals a moderate sized left atrium with four separate pulmonary veins without evidence of pulmonary vein stenosis. 3. Successful ablation of left atrial flutter as above 4. Return of conduction within the right superior and inferior pulmonary veins.  The left superior and inferior pulmonary veins were quiescent from a prior ablation procedure. 5. Successful electrical re-isolation and anatomical encircling of the right superior and inferior pulmonary veins with radiofrequency current (WACA approach).    6. Cavo-tricuspid isthmus ablation previously performed and not therefore ablated today 7. No inducible arrhythmias following ablation both on and off of Isuprel 8. No early apparent complications.   Aarin Sparkman,MD 10:46 AM 01/13/2014

## 2014-01-14 DIAGNOSIS — E785 Hyperlipidemia, unspecified: Secondary | ICD-10-CM | POA: Diagnosis not present

## 2014-01-14 DIAGNOSIS — I480 Paroxysmal atrial fibrillation: Secondary | ICD-10-CM

## 2014-01-14 DIAGNOSIS — J449 Chronic obstructive pulmonary disease, unspecified: Secondary | ICD-10-CM | POA: Diagnosis not present

## 2014-01-14 DIAGNOSIS — I484 Atypical atrial flutter: Secondary | ICD-10-CM

## 2014-01-14 LAB — BASIC METABOLIC PANEL
Anion gap: 16 — ABNORMAL HIGH (ref 5–15)
BUN: 12 mg/dL (ref 6–23)
CO2: 23 mEq/L (ref 19–32)
CREATININE: 1.01 mg/dL (ref 0.50–1.35)
Calcium: 8.6 mg/dL (ref 8.4–10.5)
Chloride: 102 mEq/L (ref 96–112)
GFR calc Af Amer: 86 mL/min — ABNORMAL LOW (ref >90)
GFR calc non Af Amer: 74 mL/min — ABNORMAL LOW (ref >90)
GLUCOSE: 128 mg/dL — AB (ref 70–99)
Potassium: 4.3 mEq/L (ref 3.7–5.3)
Sodium: 141 mEq/L (ref 137–147)

## 2014-01-14 MED ORDER — ALUM & MAG HYDROXIDE-SIMETH 200-200-20 MG/5ML PO SUSP
30.0000 mL | ORAL | Status: DC | PRN
  Administered 2014-01-14: 30 mL via ORAL
  Filled 2014-01-14 (×2): qty 30

## 2014-01-14 NOTE — Progress Notes (Signed)
Discharge instructions explained to patient, patient and significant other asked pertinent questions which were answered to their satisfaction. Vital signs stable at time of discharge. Patient taken to car in wheelchair.   Elvina Sidle, Student Nurse - [facility]

## 2014-01-14 NOTE — Discharge Instructions (Signed)
No driving for 5 days. No lifting over 5 lbs for 1 week. No sexual activity for 1 week. You may return to work in 7 days. Keep procedure site clean & dry. If you notice increased pain, swelling, bleeding or pus, call/return!  You may shower, but no soaking baths/hot tubs/pools for 1 week.  ° ° °

## 2014-01-14 NOTE — Progress Notes (Signed)
Pt c/o midsternal chest pain, non-radiating, 2/10. BP 143/79, HR 92, O2 98%. Denies SOB and not diaphoretic. Pain gets worse with burping and deep breathing. EGK obtained and showed no acute changes. Mylanta given per PRN cardiology orders.

## 2014-01-14 NOTE — Progress Notes (Signed)
Utilization Review Completed.Donne Anon T10/12/2013

## 2014-01-27 ENCOUNTER — Encounter: Payer: Self-pay | Admitting: Pulmonary Disease

## 2014-01-27 ENCOUNTER — Other Ambulatory Visit: Payer: Self-pay | Admitting: Pulmonary Disease

## 2014-01-27 ENCOUNTER — Ambulatory Visit (INDEPENDENT_AMBULATORY_CARE_PROVIDER_SITE_OTHER): Payer: Medicare Other | Admitting: Pulmonary Disease

## 2014-01-27 VITALS — BP 131/77 | HR 88 | Temp 98.4°F | Ht 75.0 in | Wt 271.0 lb

## 2014-01-27 DIAGNOSIS — J849 Interstitial pulmonary disease, unspecified: Secondary | ICD-10-CM | POA: Insufficient documentation

## 2014-01-27 DIAGNOSIS — J45901 Unspecified asthma with (acute) exacerbation: Secondary | ICD-10-CM

## 2014-01-27 LAB — RHEUMATOID FACTOR: Rhuematoid fact SerPl-aCnc: 11 IU/mL (ref <=14)

## 2014-01-27 NOTE — Assessment & Plan Note (Signed)
STOP methotrexate Chk ANA, ESR, RA factor for completion Retry PFTs in future, for now ct symbicort If worse, may need to start steroid trial - but will reassess in 90mths

## 2014-01-27 NOTE — Patient Instructions (Signed)
You have interstitial lung disease - may be related to methotrexate Ambulatory satn Blood work today Call me with number for dermatologist Call me if sinus congstion or chest cold for antibiotic

## 2014-01-27 NOTE — Progress Notes (Signed)
   Subjective:    Patient ID: Kenneth [name], male    DOB: 04-Jun-1945, 68 y.o.   MRN: [id number]  HPI  68 year old remote smoker  for FU of ILD He presented with  dyspnea and repeated episodes of Chest congestion.  He has atrial fibrillation for 15 years and is maintained on Cardizem and metoprolol. He takes flecainide if his heart rate gets too high.  He has psoriasis that is well-controlled on Enbrel and methotrexate weekly. He has been on methotrexate for 3years with good results and normal LFTs.   He reports a constant sinus drip and a dry cough.  Significant tests/ events   CT angiogram 09/2007 which shows bilateral groundglass opacities but a repeat CT in 10/2007 and shows resolution of these infiltrates. Myocardial perfusion imaging on 11/2011 was normal. Chest x-ray on 09/28/11 showed mild cardiomegaly and low lung volumes with interstitial prominence.    He worked for a [employer] and is now retired, denies any symptoms of arthritis or skin rash  He smokes cigars quit about 20 years ago  He reports a diagnosis of obstructive sleep apnea at the [place] years ago, but is not interested in pursuing this further  01/27/2014  Chief Complaint  Patient presents with  . Follow-up    to discuss PFT and CT scan results.    HRCT - patchy areas of very mild ground-glass attenuation and subpleural reticulation. Could not perform PFTs Desatn to 94% on walking     Review of Systems neg for any significant sore throat, dysphagia, itching, sneezing, nasal congestion or excess/ purulent secretions, fever, chills, sweats, unintended wt loss, pleuritic or exertional cp, hempoptysis, orthopnea pnd or change in chronic leg swelling. Also denies presyncope, palpitations, heartburn, abdominal pain, nausea, vomiting, diarrhea or change in bowel or urinary habits, dysuria,hematuria, rash, arthralgias, visual complaints, headache, numbness weakness or ataxia.     Objective:   Physical Exam  Gen.  Pleasant, obese, in no distress ENT - no lesions, no post nasal drip Neck: No JVD, no thyromegaly, no carotid bruits Lungs: no use of accessory muscles, no dullness to percussion, bibasal rales, no rhonchi  Cardiovascular: Rhythm regular, heart sounds  normal, no murmurs or gallops, no peripheral edema Musculoskeletal: No deformities, no cyanosis or clubbing , no tremors       Assessment & Plan:

## 2014-01-28 ENCOUNTER — Encounter (HOSPITAL_COMMUNITY): Payer: Self-pay | Admitting: Nurse Practitioner

## 2014-01-28 ENCOUNTER — Ambulatory Visit (HOSPITAL_COMMUNITY)
Admission: RE | Admit: 2014-01-28 | Discharge: 2014-01-28 | Disposition: A | Discharge status: Home / Self Care | Payer: Medicare Other | Source: Ambulatory Visit | Attending: Nurse Practitioner | Admitting: Nurse Practitioner

## 2014-01-28 VITALS — BP 125/66 | HR 87 | Ht 75.0 in | Wt 268.2 lb

## 2014-01-28 DIAGNOSIS — J849 Interstitial pulmonary disease, unspecified: Secondary | ICD-10-CM | POA: Insufficient documentation

## 2014-01-28 DIAGNOSIS — I480 Paroxysmal atrial fibrillation: Secondary | ICD-10-CM | POA: Insufficient documentation

## 2014-01-28 DIAGNOSIS — R0600 Dyspnea, unspecified: Secondary | ICD-10-CM | POA: Diagnosis not present

## 2014-01-28 DIAGNOSIS — E785 Hyperlipidemia, unspecified: Secondary | ICD-10-CM | POA: Diagnosis not present

## 2014-01-28 DIAGNOSIS — I4892 Unspecified atrial flutter: Secondary | ICD-10-CM | POA: Diagnosis not present

## 2014-01-28 DIAGNOSIS — J449 Chronic obstructive pulmonary disease, unspecified: Secondary | ICD-10-CM | POA: Diagnosis not present

## 2014-01-28 DIAGNOSIS — Z7901 Long term (current) use of anticoagulants: Secondary | ICD-10-CM | POA: Insufficient documentation

## 2014-01-28 DIAGNOSIS — I4891 Unspecified atrial fibrillation: Secondary | ICD-10-CM

## 2014-01-28 LAB — ANA: Anti Nuclear Antibody(ANA): NEGATIVE

## 2014-01-28 LAB — SEDIMENTATION RATE: Sed Rate: 5 mm/hr (ref 0–16)

## 2014-01-28 NOTE — Patient Instructions (Signed)
Your physician recommends that you schedule a follow-up appointment in: AS SCHEDULED  

## 2014-01-28 NOTE — Progress Notes (Signed)
PCP: Garnett Farm, MD Primary Cardiologist:  Ishaaq Penna is a 68 y.o. male who presents today for EP F/u. PVI. He underwent EPS/RFCA of atrial fibrillation and atypical atrial flutter on 01/13/14.  He returns today with no s/o of recurrent tachyarrhythmia. However, sob is ongoing and pt states Dr. Elsworth Soho recently told him that he had significant scarring of the lungs. He is going to be seeing dermatology soon to see if he can d/c methotrexate which may be contributing to lung issues. He denies any issues with PVI, such as leg pain or chest pain psot procedure. He has spent all day at the [place] being evaluated for the above issues.   Today,    No chest pain or Palpitations,  Positive for shortness of breath,  no lower extremity edema, positive for mild dizziness, presyncope, or syncope.  The patient is otherwise without complaint today.   Past Medical History  Diagnosis Date  . Paroxysmal atrial fibrillation     a. 11/2003 Tikosyn initiated - subsequently d/c'd;  b. 2006 RFCA for Afib @ [facility];  c. 09/2011 Echo: EF 60-65%, Gr 2 DD;  d. 10/2011 DCCV  . Psoriasis   . Dyspepsia   . Deafness   . Kidney stones   . Hyperlipidemia   . Macular degeneration     pt reports that he actually has a "retina problem' not MD  . Pneumonia 2009    in context of Flu  . Atypical chest pain     a. 06/2003 Cath:  nl cors;  b. 11/2006 MV: no ischemia, nl LV  . Cataract   . COPD (chronic obstructive pulmonary disease)   . Diverticulosis of colon (without mention of hemorrhage) 1999, 2006  . Hiatal hernia 1999  . Esophagitis 1999  . Adenomatous polyp of colon 2006  . Arthritis   . Skin cancer     on leg, left arm   Past Surgical History  Procedure Laterality Date  . Rotator cuff repair    . Atrial fibrillation ablation  2006    Dr Rolland Porter at [facility]  . Eye surgery Bilateral     cataracts with lens implants  . Appendectomy  1996  . Tonsillectomy    . Cardioversion N/A 05/22/2013    Procedure:  CARDIOVERSION;  Surgeon: [name], MD;  Location: [facility] OR;  Service: Cardiovascular;  Laterality: N/A;  . Nasal sinus surgery      x 2    Current Outpatient Prescriptions  Medication Sig Dispense Refill  . acetaminophen (TYLENOL) 500 MG tablet Take 500 mg by mouth every 6 (six) hours as needed.       Marland Kitchen albuterol (PROVENTIL HFA;VENTOLIN HFA) 108 (90 BASE) MCG/ACT inhaler Inhale 1 puff into the lungs every 6 (six) hours as needed for wheezing or shortness of breath.       . budesonide-formoterol (SYMBICORT) 160-4.5 MCG/ACT inhaler Inhale 2 puffs into the lungs daily as needed.      . cetirizine (ZYRTEC) 10 MG tablet Take 10 mg by mouth daily.      . chlorpheniramine (CHLOR-TRIMETON) 4 MG tablet Take 4 mg by mouth daily as needed.       . diltiazem (DILTIAZEM CD) 180 MG 24 hr capsule Take 180 mg by mouth daily.      Marland Kitchen etanercept (ENBREL) 50 MG/ML injection Inject 50 mg into the skin every Friday.      . flecainide (TAMBOCOR) 100 MG tablet Take 300 mg by mouth once. Take  300mg  if heart rate reaches 140 beats per minute and monitor heart rate for 3 hours, if not returned to normal-go to [facility] ER      . folic acid (FOLVITE) 1 MG tablet Take 1 mg by mouth daily.      . Magnesium 400 MG CAPS Take 400 mg by mouth 2 (two) times daily.       . methotrexate (RHEUMATREX) 2.5 MG tablet Take 12.5 mg by mouth every Tuesday. Caution:Chemotherapy. Protect from light.      Marland Kitchen omeprazole (PRILOSEC) 20 MG capsule Take 20 mg by mouth 2 (two) times daily before a meal.       . Probiotic Product (ALIGN) 4 MG CAPS Take 4 mg by mouth daily.      . rivaroxaban (XARELTO) 20 MG TABS tablet Take 20 mg by mouth daily with supper.       . zolpidem (AMBIEN) 10 MG tablet Take 10 mg by mouth at bedtime as needed for sleep.        No current facility-administered medications for this encounter.   Facility-Administered Medications Ordered in Other Encounters  Medication Dose Route Frequency Provider Last Rate Last Dose    . lidocaine (cardiac) 100 mg/45ml (XYLOCAINE) 20 MG/ML injection 2%    Anesthesia Intra-op Rokoshi T Flowers, CRNA   60 mg at 03/25/13 1617  . propofol (DIPRIVAN) 10 mg/mL bolus/IV push    Anesthesia Intra-op Durwin Glaze Flowers, CRNA   90 mg at 03/25/13 1617    Physical Exam: Filed Vitals:   01/28/14 1514  BP: 125/66  Pulse: 87  Height: 6\' 3"  (1.905 m)  Weight: 268 lb 3.2 oz (121.655 kg)  SpO2: 98%    GEN- The patient is well appearing, alert and oriented x 3 today.   Head- normocephalic, atraumatic Eyes-  Sclera clear, conjunctiva pink Ears- hearing intact Oropharynx- clear Lungs- Fine dry crackles in bases bilaterally, consistent with interstitial  disease. Heart- Rapid, regular rate and rhythm, no murmurs, rubs or gallops, PMI not laterally displaced GI- soft, NT, ND, + BS Extremities- no clubbing, cyanosis, or edema  EKG- Normal sinus rhythm at 84 bpm, normal EKG. Dr. Bari Mantis note reviewed.  Assessment and Plan:  1. Afib/ atypical atrial flutter S/p ablation with pt doing well from a heart standpoint. Continue xarelto. Pt understands not to miss a dose.  2.  Worsening dyspnea with exertion / ILD Continue f/u with Dr. Elsworth Soho and dermatology to stop methotrexate. Handicap permit filled out for pt today.  F/u Dr. Rayann Heman as scheduled 04/25/14.

## 2014-01-31 ENCOUNTER — Ambulatory Visit: Payer: Medicare Other | Admitting: Internal Medicine

## 2014-02-01 ENCOUNTER — Encounter (HOSPITAL_COMMUNITY): Payer: Medicare Other | Admitting: Nurse Practitioner

## 2014-02-11 ENCOUNTER — Encounter: Payer: Self-pay | Admitting: Internal Medicine

## 2014-03-10 ENCOUNTER — Other Ambulatory Visit: Payer: Self-pay | Admitting: Internal Medicine

## 2014-03-17 ENCOUNTER — Encounter (HOSPITAL_COMMUNITY): Payer: Self-pay | Admitting: Internal Medicine

## 2014-04-21 ENCOUNTER — Encounter (HOSPITAL_COMMUNITY): Payer: Self-pay | Admitting: Internal Medicine

## 2014-04-25 ENCOUNTER — Encounter: Payer: Medicare Other | Admitting: Internal Medicine

## 2014-04-29 ENCOUNTER — Encounter: Payer: Medicare Other | Admitting: Internal Medicine

## 2014-05-02 ENCOUNTER — Telehealth: Payer: Self-pay | Admitting: *Deleted

## 2014-05-02 NOTE — Telephone Encounter (Signed)
Patient has not taken the xarelto in 4-5 days, and has an appointment for this Thursday and would like to know if he should re-start it or just continue off as he anticipates Dr Rayann Heman discontinuing it. Please advise. Thanks, MI

## 2014-05-03 NOTE — Telephone Encounter (Signed)
Called and with lmom for patient that I can not tell him it is okay to stop the medication but that as he has been off for 5 days what else is there to do other than wait until his follow up Thurs 05/05/13.  I did say it is not advisable to stop medications without consulting with the doctor first.

## 2014-05-05 ENCOUNTER — Ambulatory Visit (INDEPENDENT_AMBULATORY_CARE_PROVIDER_SITE_OTHER): Payer: Medicare Other | Admitting: Internal Medicine

## 2014-05-05 ENCOUNTER — Encounter: Payer: Self-pay | Admitting: Internal Medicine

## 2014-05-05 VITALS — BP 140/78 | HR 84 | Ht 75.0 in | Wt 269.8 lb

## 2014-05-05 DIAGNOSIS — I481 Persistent atrial fibrillation: Secondary | ICD-10-CM

## 2014-05-05 DIAGNOSIS — I480 Paroxysmal atrial fibrillation: Secondary | ICD-10-CM

## 2014-05-05 DIAGNOSIS — I484 Atypical atrial flutter: Secondary | ICD-10-CM

## 2014-05-05 DIAGNOSIS — I4819 Other persistent atrial fibrillation: Secondary | ICD-10-CM

## 2014-05-05 NOTE — Progress Notes (Signed)
PCP: Garnett Farm, MD  Kenneth [name] is a 69 y.o. male who presents today for routine electrophysiology followup.  Since his recent ablation, the patient reports doing very well.  He denies procedure related complications.  He has had no symptoms of afib/ atrial flutter.   His primary concern is with psoriasis and its treatment which is not well controlled since stopping mtx due to concerns of lung disease.  His SOB is stable.  He remains active.  Today, he denies symptoms of palpitations, chest pain,  lower extremity edema, dizziness, presyncope, or syncope.  The patient is otherwise without complaint today.   Past Medical History  Diagnosis Date  . Paroxysmal atrial fibrillation     a. 11/2003 Tikosyn initiated - subsequently d/c'd;  b. 2006 RFCA for Afib @ [facility];  c. 09/2011 Echo: EF 60-65%, Gr 2 DD;  d. 10/2011 DCCV  . Psoriasis   . Dyspepsia   . Deafness   . Kidney stones   . Hyperlipidemia   . Macular degeneration     pt reports that he actually has a "retina problem' not MD  . Pneumonia 2009    in context of Flu  . Atypical chest pain     a. 06/2003 Cath:  nl cors;  b. 11/2006 MV: no ischemia, nl LV  . Cataract   . COPD (chronic obstructive pulmonary disease)   . Diverticulosis of colon (without mention of hemorrhage) 1999, 2006  . Hiatal hernia 1999  . Esophagitis 1999  . Adenomatous polyp of colon 2006  . Arthritis   . Skin cancer     on leg, left arm   Past Surgical History  Procedure Laterality Date  . Rotator cuff repair    . Atrial fibrillation ablation  2006    Dr Rolland Porter at [facility]  . Eye surgery Bilateral     cataracts with lens implants  . Appendectomy  1996  . Tonsillectomy    . Cardioversion N/A 05/22/2013    Procedure: CARDIOVERSION;  Surgeon: [name], MD;  Location: [facility] OR;  Service: Cardiovascular;  Laterality: N/A;  . Nasal sinus surgery      x 2  . Cardioversion N/A 10/23/2011    Procedure: CARDIOVERSION;  Surgeon: Deboraha Sprang, MD;  Location: [facility] CATH  LAB;  Service: Cardiovascular;  Laterality: N/A;  . Cardioversion N/A 05/01/2012    Procedure: CARDIOVERSION;  Surgeon: Deboraha Sprang, MD;  Location: [facility] CATH LAB;  Service: Cardiovascular;  Laterality: N/A;  . Atrial fibrillation ablation N/A 01/13/2014    repeat PVI, also left atrial ablation performed with successfull ablation of LA flutter by Dr Rayann Heman    ROS- all systems are reviewed and negatives except as per HPI above  Current Outpatient Prescriptions  Medication Sig Dispense Refill  . Adalimumab 40 MG/0.8ML PNKT Inject 40 mg into the skin every 14 (fourteen) days. Fridays    . albuterol (PROVENTIL HFA;VENTOLIN HFA) 108 (90 BASE) MCG/ACT inhaler Inhale 1 puff into the lungs every 6 (six) hours as needed for wheezing or shortness of breath.     . budesonide-formoterol (SYMBICORT) 160-4.5 MCG/ACT inhaler Inhale 2 puffs into the lungs daily as needed (shortness of breath or wheezing).     . cetirizine (ZYRTEC) 10 MG tablet Take 10 mg by mouth daily.    . chlorpheniramine (CHLOR-TRIMETON) 4 MG tablet Take 4 mg by mouth daily as needed for allergies or rhinitis.     . flecainide (TAMBOCOR) 100 MG tablet Take 300 mg by mouth  once. Take 300mg  if heart rate reaches 140 beats per minute and monitor heart rate for 3 hours, if not returned to normal-go to [facility] ER    . Magnesium 400 MG CAPS Take 400 mg by mouth 2 (two) times daily.     . naproxen sodium (ANAPROX) 220 MG tablet Take 220 mg by mouth 2 (two) times daily as needed (pain).    Marland Kitchen omeprazole (PRILOSEC) 20 MG capsule Take 20 mg by mouth 2 (two) times daily before a meal.     . Probiotic Product (ALIGN) 4 MG CAPS Take 4 mg by mouth daily.    Marland Kitchen zolpidem (AMBIEN) 10 MG tablet Take 10 mg by mouth at bedtime as needed for sleep.      No current facility-administered medications for this visit.   Facility-Administered Medications Ordered in Other Visits  Medication Dose Route Frequency Provider Last Rate Last Dose  . lidocaine (cardiac)  100 mg/56ml (XYLOCAINE) 20 MG/ML injection 2%    Anesthesia Intra-op Rokoshi T Flowers, CRNA   60 mg at 03/25/13 1617  . propofol (DIPRIVAN) 10 mg/mL bolus/IV push    Anesthesia Intra-op Durwin Glaze Flowers, CRNA   90 mg at 03/25/13 1617    Physical Exam: Filed Vitals:   05/05/14 1358  BP: 140/78  Pulse: 84  Height: 6\' 3"  (1.905 m)  Weight: 269 lb 12.8 oz (122.38 kg)    GEN- The patient is well appearing, alert and oriented x 3 today.   Head- normocephalic, atraumatic Eyes-  Sclera clear, conjunctiva pink Ears- hearing intact Oropharynx- clear Lungs- few dry basilar rales normal work of breathing Heart- Regular rate and rhythm, no murmurs, rubs or gallops, PMI not laterally displaced GI- soft, NT, ND, + BS Extremities- no clubbing, cyanosis, or edema  ekg today reveals sinus rhythm 88 bpm, otherwise normal ekg  Assessment and Plan:  1. Afib/ atypical flutter Doing well s/p ablation wtihout recurrence He has prn flecainde that he can take Stop diltiazem today.  He can resume if he has further atrial arrhythmias chads2vasc score is 1.  He is clear that he wishes to stop xarelto today.  Per guidelines, this is a reasonable option.  Return to see Kenneth Palau NP in 3 months I will see in 6 months

## 2014-05-05 NOTE — Patient Instructions (Signed)
Your physician recommends that you schedule a follow-up appointment in: 3 months with Roderic Palau, NP and 6 months with Dr. Rayann Heman   Your physician has recommended you make the following change in your medication:  1) Stop Xarelto 2) Stop Diltiazem

## 2014-06-21 ENCOUNTER — Ambulatory Visit (INDEPENDENT_AMBULATORY_CARE_PROVIDER_SITE_OTHER): Payer: Medicare Other | Admitting: Pulmonary Disease

## 2014-06-21 ENCOUNTER — Encounter: Payer: Self-pay | Admitting: Pulmonary Disease

## 2014-06-21 ENCOUNTER — Ambulatory Visit (INDEPENDENT_AMBULATORY_CARE_PROVIDER_SITE_OTHER)
Admission: RE | Admit: 2014-06-21 | Discharge: 2014-06-21 | Disposition: A | Discharge status: Home / Self Care | Payer: Medicare Other | Source: Ambulatory Visit | Attending: Pulmonary Disease | Admitting: Pulmonary Disease

## 2014-06-21 DIAGNOSIS — J849 Interstitial pulmonary disease, unspecified: Secondary | ICD-10-CM

## 2014-06-21 MED ORDER — PREDNISONE 10 MG PO TABS: ORAL_TABLET | ORAL | Status: DC

## 2014-06-21 MED ORDER — ALBUTEROL SULFATE HFA 108 (90 BASE) MCG/ACT IN AERS
1.0000 | INHALATION_SPRAY | Freq: Four times a day (QID) | RESPIRATORY_TRACT | Status: DC | PRN
Start: 2014-06-21 — End: 2015-12-20

## 2014-06-21 MED ORDER — BUDESONIDE-FORMOTEROL FUMARATE 160-4.5 MCG/ACT IN AERO: 2.0000 | INHALATION_SPRAY | Freq: Two times a day (BID) | RESPIRATORY_TRACT | Status: DC

## 2014-06-21 NOTE — Patient Instructions (Addendum)
CXR today Lung function is reduced Repeat CT scan in June 2016  Refills on symbicort & albuterol MDI Prednisone 10 mg daily x 2 weeks , then increase to 20 mg Pulmonary rehab referral

## 2014-06-21 NOTE — Progress Notes (Signed)
   Subjective:    Patient ID: Kenneth [name], male    DOB: Dec 11, 1945, 69 y.o.   MRN: [id number]  HPI  69 year old remote smoker with psoriasis  for FU of ILD He presented with  dyspnea and repeated episodes of Chest congestion.  He has atrial fibrillation for 15 years s/p atrial ablation 01/2014 . He takes flecainide if his heart rate gets too high.  Hewas on Enbrel and methotrexate weekly for 3 years.  He worked for a [employer] and is now retired, he works on Insurance underwriter with a Advice worker He smokes cigars quit about 20 years ago  He reports a diagnosis of obstructive sleep apnea at the [place] years ago, but is not interested in pursuing this further   06/21/2014  Chief Complaint  Patient presents with  . Follow-up    Reports breathing is getting worse, wheezing, dry cough. Getting worse with weather.     Off methotrexate & enbrel - on humira  After atrial ablation- dyspnea improved , worse again last 2 weeks- wife feels this may be related to allergies Takes CPM for allergies  He reports a constant sinus drip and a dry cough.  Significant tests/ events   CT angiogram 09/2007 which shows bilateral groundglass opacities but a repeat CT in 10/2007 and shows resolution of these infiltrates. Myocardial perfusion imaging on 11/2011 was normal.  01/27/2014 HRCT - patchy areas of very mild ground-glass attenuation and subpleural reticulation. Could not perform PFTs, Desatn to 94% on walking  Spirometry 06/21/14 -moderate restriction FVC 50%, ratio normal  Review of Systems neg for any significant sore throat, dysphagia, itching, sneezing, nasal congestion or excess/ purulent secretions, fever, chills, sweats, unintended wt loss, pleuritic or exertional cp, hempoptysis, orthopnea pnd or change in chronic leg swelling. Also denies presyncope, palpitations, heartburn, abdominal pain, nausea, vomiting, diarrhea or change in bowel or urinary habits, dysuria,hematuria, rash, arthralgias, visual  complaints, headache, numbness weakness or ataxia.      Objective:   Physical Exam  Gen. Pleasant, obese, in no distress, normal affect ENT - no lesions, no post nasal drip, class 2-3 airway Neck: No JVD, no thyromegaly, no carotid bruits Lungs: no use of accessory muscles, no dullness to percussion, bibasal rales, no rhonchi  Cardiovascular: Rhythm regular, heart sounds  normal, no murmurs or gallops, no peripheral edema Abdomen: soft and non-tender, no hepatosplenomegaly, BS normal. Musculoskeletal: No deformities, no cyanosis or clubbing Neuro:  alert, non focal, no tremors       Assessment & Plan:

## 2014-06-21 NOTE — Assessment & Plan Note (Addendum)
CXR today Lung function shows moderate to severe restriction Repeat CT scan in June 2016  Refills on symbicort & albuterol MDI It has been a few months since methotrexate was stopped -but has ILD has not reversed . I discussed biopsy versus empiric trial of steroids-he would like to defer biopsy for now. we will trial-Prednisone 10 mg daily x 2 weeks , then increase to 20 mg Pulmonary rehab referral

## 2014-06-27 ENCOUNTER — Telehealth (HOSPITAL_COMMUNITY): Payer: Self-pay

## 2014-06-27 NOTE — Telephone Encounter (Signed)
I have called and left a message with Mouhamed to inquire about participation in Pulmonary Rehab.

## 2014-07-08 ENCOUNTER — Telehealth (HOSPITAL_COMMUNITY): Payer: Self-pay

## 2014-07-08 NOTE — Telephone Encounter (Signed)
I have called and left a message with Joaquim to inquire about participation in Pulmonary Rehab per Dr. Bari Mantis referral. Will send letter in mail and follow up.

## 2014-07-15 ENCOUNTER — Encounter (HOSPITAL_COMMUNITY): Payer: Self-pay | Admitting: *Deleted

## 2014-07-18 ENCOUNTER — Ambulatory Visit (HOSPITAL_COMMUNITY)
Admission: RE | Admit: 2014-07-18 | Discharge: 2014-07-18 | Disposition: A | Discharge status: Home / Self Care | Payer: Medicare Other | Source: Ambulatory Visit | Attending: Nurse Practitioner | Admitting: Nurse Practitioner

## 2014-07-18 ENCOUNTER — Encounter (HOSPITAL_COMMUNITY)
Admission: RE | Admit: 2014-07-18 | Discharge: 2014-07-18 | Disposition: A | Discharge status: Home / Self Care | Payer: Medicare Other | Source: Ambulatory Visit | Attending: Pulmonary Disease | Admitting: Pulmonary Disease

## 2014-07-18 ENCOUNTER — Encounter (HOSPITAL_COMMUNITY): Payer: Self-pay | Admitting: Nurse Practitioner

## 2014-07-18 VITALS — BP 152/82 | HR 88 | Ht 75.0 in | Wt 275.2 lb

## 2014-07-18 VITALS — BP 140/76 | HR 70

## 2014-07-18 DIAGNOSIS — J849 Interstitial pulmonary disease, unspecified: Secondary | ICD-10-CM | POA: Diagnosis not present

## 2014-07-18 DIAGNOSIS — I4892 Unspecified atrial flutter: Secondary | ICD-10-CM | POA: Insufficient documentation

## 2014-07-18 DIAGNOSIS — Z7901 Long term (current) use of anticoagulants: Secondary | ICD-10-CM | POA: Diagnosis not present

## 2014-07-18 DIAGNOSIS — J8410 Pulmonary fibrosis, unspecified: Secondary | ICD-10-CM

## 2014-07-18 DIAGNOSIS — I4891 Unspecified atrial fibrillation: Secondary | ICD-10-CM | POA: Diagnosis present

## 2014-07-18 DIAGNOSIS — I481 Persistent atrial fibrillation: Secondary | ICD-10-CM | POA: Diagnosis not present

## 2014-07-18 DIAGNOSIS — I4819 Other persistent atrial fibrillation: Secondary | ICD-10-CM

## 2014-07-18 NOTE — Progress Notes (Signed)
Kenneth [name] 69 y.o. male Pulmonary Rehab Orientation Note Patient arrived today in Cardiac and Pulmonary Rehab for orientation to Pulmonary Rehab. He walked from the parking garage. He does not carry portable oxygen. Per pt, he uses oxygen never. Color good, skin warm and dry. Patient is oriented to time and place. Patient's medical history and medications reviewed. Heart rate is normal, breath sounds clear to auscultation, no wheezes, rales, or rhonchi, diminished. Grip strength equal, strong. Distal pulses were 3+ bilateral posterior pulses present bilaterally. Patient reports *he does take medications as prescribed. Patient states he follows a Regular diet. The patient reports no specific efforts to gain or lose weight. He has noticed over the last 2 years that he has gained weight without changing his eating and activity.  He is on prednisone on a daily basis and feels this is the reason for the weight gain.  Patient's weight will be monitored closely. Demonstration and practice of PLB using pulse oximeter. Patient able to return demonstration satisfactorily. We discussed his new diagnosis of pulmonary fibrosis and that [name] has a support group that meets 4 times per year.  Safety and hand hygiene in the exercise area reviewed with patient. Patient voices understanding of the information reviewed. Department expectations discussed with patient and achievable goals were set. The patient shows enthusiasm about attending the program and we look forward to working with this nice gentleman. The patient is scheduled for a 6 min walk test on Tuesday, July 19, 2014 @ 4 pm and to begin exercise on Thursday, July 23, 2014 in the 1:30 pm class. [phone]

## 2014-07-18 NOTE — Progress Notes (Signed)
Patient ID: Kenneth [name], male   DOB: 02/14/1946, 69 y.o.   MRN: [id number]   PCP: Garnett Farm, MD  Pulmonologist: Dr. Autumn Patty is a 69 y.o. male who presents today in afib clinic s/p afib/flutter ablation 10/15. He reports doing well from a heart standpoint without any awareness of irregular heartbeat. He is off daily antiarrythmic and blood thinner.  He continues to have issues with ILD. Two weeks ago prednisone was increased from 10 mg to 20 mg daily. He states he feels like his breathing is better, but fears he is not tolerating the higher dose of prednisone. He has gained 13 lbs in this period of time and has experienced polyuria. His BP today is elevated at 160/80 and he does not carry previous dx of HTN. He remains active.    Today, he denies symptoms of palpitations, chest pain,  lower extremity edema, dizziness, presyncope, or syncope.  The patient is otherwise without complaint today as for above.  Past Medical History  Diagnosis Date  . Paroxysmal atrial fibrillation     a. 11/2003 Tikosyn initiated - subsequently d/c'd;  b. 2006 RFCA for Afib @ [facility];  c. 09/2011 Echo: EF 60-65%, Gr 2 DD;  d. 10/2011 DCCV  . Psoriasis   . Dyspepsia   . Deafness   . Kidney stones   . Hyperlipidemia   . Macular degeneration     pt reports that he actually has a "retina problem' not MD  . Pneumonia 2009    in context of Flu  . Atypical chest pain     a. 06/2003 Cath:  nl cors;  b. 11/2006 MV: no ischemia, nl LV  . Cataract   . COPD (chronic obstructive pulmonary disease)   . Diverticulosis of colon (without mention of hemorrhage) 1999, 2006  . Hiatal hernia 1999  . Esophagitis 1999  . Adenomatous polyp of colon 2006  . Arthritis   . Skin cancer     on leg, left arm   Past Surgical History  Procedure Laterality Date  . Rotator cuff repair    . Atrial fibrillation ablation  2006    Dr Rolland Porter at [facility]  . Eye surgery Bilateral     cataracts with lens implants  .  Appendectomy  1996  . Tonsillectomy    . Cardioversion N/A 05/22/2013    Procedure: CARDIOVERSION;  Surgeon: [name], MD;  Location: [facility] OR;  Service: Cardiovascular;  Laterality: N/A;  . Nasal sinus surgery      x 2  . Cardioversion N/A 10/23/2011    Procedure: CARDIOVERSION;  Surgeon: Deboraha Sprang, MD;  Location: [facility] CATH LAB;  Service: Cardiovascular;  Laterality: N/A;  . Cardioversion N/A 05/01/2012    Procedure: CARDIOVERSION;  Surgeon: Deboraha Sprang, MD;  Location: [facility] CATH LAB;  Service: Cardiovascular;  Laterality: N/A;  . Atrial fibrillation ablation N/A 01/13/2014    repeat PVI, also left atrial ablation performed with successfull ablation of LA flutter by Dr Rayann Heman    ROS- all systems are reviewed and negatives except as per HPI above  Current Outpatient Prescriptions  Medication Sig Dispense Refill  . Adalimumab 40 MG/0.8ML PNKT Inject 40 mg into the skin every 14 (fourteen) days. Fridays    . albuterol (PROVENTIL HFA;VENTOLIN HFA) 108 (90 BASE) MCG/ACT inhaler Inhale 1 puff into the lungs every 6 (six) hours as needed for wheezing or shortness of breath. 1 Inhaler 3  . atorvastatin (LIPITOR) 40  MG tablet Take 20 mg by mouth daily.    . budesonide-formoterol (SYMBICORT) 160-4.5 MCG/ACT inhaler Inhale 2 puffs into the lungs 2 (two) times daily. 1 Inhaler 6  . cetirizine (ZYRTEC) 10 MG tablet Take 10 mg by mouth daily.    . flecainide (TAMBOCOR) 100 MG tablet Take 300 mg by mouth as needed. Take 300mg  if heart rate reaches 140 beats per minute and monitor heart rate for 3 hours, if not returned to normal-go to [facility] ER    . Magnesium 400 MG CAPS Take 400 mg by mouth daily.     . naproxen sodium (ANAPROX) 220 MG tablet Take 220 mg by mouth 2 (two) times daily as needed (pain).    Marland Kitchen omeprazole (PRILOSEC) 20 MG capsule Take 20 mg by mouth 2 (two) times daily before a meal.     . predniSONE (DELTASONE) 10 MG tablet Take 10 mg daily x 2 weeks then increase to 20 mg daily 90  tablet 1  . Probiotic Product (ALIGN) 4 MG CAPS Take 4 mg by mouth daily.    Marland Kitchen zolpidem (AMBIEN) 10 MG tablet Take 10 mg by mouth at bedtime as needed for sleep.     . chlorpheniramine (CHLOR-TRIMETON) 4 MG tablet Take 4 mg by mouth daily as needed for allergies or rhinitis.      No current facility-administered medications for this encounter.   Facility-Administered Medications Ordered in Other Encounters  Medication Dose Route Frequency Provider Last Rate Last Dose  . lidocaine (cardiac) 100 mg/74ml (XYLOCAINE) 20 MG/ML injection 2%    Anesthesia Intra-op Rokoshi T Flowers, CRNA   60 mg at 03/25/13 1617  . propofol (DIPRIVAN) 10 mg/mL bolus/IV push    Anesthesia Intra-op Durwin Glaze Flowers, CRNA   90 mg at 03/25/13 1617    Physical Exam: Filed Vitals:   07/18/14 1318  BP: 152/82  Pulse: 88  Height: 6\' 3"  (1.905 m)  Weight: 275 lb 3.2 oz (124.83 kg)    GEN- The patient is well appearing, alert and oriented x 3 today.   Head- normocephalic, atraumatic Eyes-  Sclera clear, conjunctiva pink Ears- hearing intact Oropharynx- clear Lungs- No basilar crackles heard today.  Normal work of breathing Heart- Regular rate and rhythm, no murmurs, rubs or gallops, PMI not laterally displaced. GI- soft, NT, ND, + BS Extremities- no clubbing, cyanosis, or edema  ekg today reveals sinus rhythm 88 bpm.  Assessment and Plan:  1. Afib/ atypical flutter Doing well s/p ablation wtihout recurrence He has prn flecainde that he can take. chads2vasc score is 1. Off xarelto per pt wishes.  Per guidelines, this is a reasonable option.  2. ILD Pt does not feel like he is tolerating higher dose of prednisone due to 13 lb weight gain in 2 weeks, polyuria, elevated BP, although breathing is improved. I will send my note for Dr. Bari Mantis review and pt advised to call the office as well, to report symptoms. He wanted to reduce dose back to 10 mg himself, but advised to wait for Dr. Bari Mantis recommendation.  F/u  Dr. Rayann Heman in 3 months. Dr. Elsworth Soho later in April as scheduled.

## 2014-07-18 NOTE — Patient Instructions (Signed)
Follow up with Dr. Rayann Heman in 3 months  Be sure to let Dr. Elsworth Soho know your concerns regarding prednisone

## 2014-07-19 ENCOUNTER — Ambulatory Visit (HOSPITAL_COMMUNITY): Payer: Medicare Other

## 2014-07-20 NOTE — Addendum Note (Signed)
Encounter addended by: Asencion Gowda, CCT on: 07/20/2014  9:08 AM<BR>     Documentation filed: Charges VN

## 2014-07-21 ENCOUNTER — Ambulatory Visit (HOSPITAL_COMMUNITY): Payer: Medicare Other

## 2014-07-21 ENCOUNTER — Telehealth (INDEPENDENT_AMBULATORY_CARE_PROVIDER_SITE_OTHER): Payer: Medicare Other | Admitting: Pulmonary Disease

## 2014-07-21 DIAGNOSIS — R7309 Other abnormal glucose: Secondary | ICD-10-CM

## 2014-07-21 DIAGNOSIS — T380X5A Adverse effect of glucocorticoids and synthetic analogues, initial encounter: Secondary | ICD-10-CM

## 2014-07-21 NOTE — Telephone Encounter (Signed)
lmomtcb x1 

## 2014-07-21 NOTE — Telephone Encounter (Signed)
Spoke with patient, patient will be coming by the office tomorrow to have blood sugar checked.  Patient will ask for me so I can check his sugars.  Patient aware that he should be fasting when he comes by.  Patient notified to drop prednisone and advised to keep appointment with Dr. Elsworth Soho on 4/29

## 2014-07-21 NOTE — Telephone Encounter (Signed)
Roderic Palau, NP sent a message to Dr. Elsworth Soho advising him that patient has been having trouble on the prednisone.  Dr. Elsworth Soho recommends that patient drop down to 10mg  and either schedule an appointment with his PCP to have his blood sugars checked or schedule appointment at our office with RA or TP.  Dr. Elsworth Soho wants this appointment scheduled ASAP.  Left message with patient's wife advising her to have patient call us back and left message on patient's cell phone to have patient call back.  Awaiting patient's call.

## 2014-07-21 NOTE — Telephone Encounter (Signed)
See TP on 4/21 Drop pred to 10 mg

## 2014-07-21 NOTE — Telephone Encounter (Signed)
LMTCB for pt on cell number ([phone]) Called and spoke to pt's wife. Pt's PCP is at the [place] and they are unsure of how soon they can get an appt. RA does not have office next week (week of 4/18) and TP has openings on 4/21, however pt already has an appt with RA on 4/29.   RA please advise if you would like pt to keep appt with you or see TP on 4/21.

## 2014-07-21 NOTE — Telephone Encounter (Signed)
[phone], pt cb

## 2014-07-22 LAB — POCT CBG (FASTING - GLUCOSE)-MANUAL ENTRY: Glucose Fasting, POC: 98 mg/dL (ref 70–99)

## 2014-07-22 NOTE — Telephone Encounter (Signed)
Checked patient's fasting glucose this morning, it was 98.  Patient says that he checked his sugar last night, 5 hours after eating and it was 188.  Patient is also concerned because he has lost 15lbs within the past week.

## 2014-07-22 NOTE — Telephone Encounter (Signed)
Lmtcb. [employer], ok to tell patient that sugars okay, just stay on 10mg  prednisone until OV on 4/29.  Will discuss weight loss at OV, please keep log of weight.

## 2014-07-22 NOTE — Telephone Encounter (Signed)
Patient notified.  Nothing further needed. 

## 2014-07-22 NOTE — Telephone Encounter (Signed)
-  sugars ok -stay on 10 mg pred until OV on 4/29

## 2014-07-26 ENCOUNTER — Ambulatory Visit (HOSPITAL_COMMUNITY): Payer: Medicare Other

## 2014-07-28 ENCOUNTER — Ambulatory Visit (HOSPITAL_COMMUNITY): Payer: Medicare Other

## 2014-08-02 ENCOUNTER — Encounter (HOSPITAL_COMMUNITY): Payer: Medicare Other

## 2014-08-02 ENCOUNTER — Ambulatory Visit (HOSPITAL_COMMUNITY): Payer: Medicare Other

## 2014-08-04 ENCOUNTER — Ambulatory Visit (HOSPITAL_COMMUNITY): Payer: Medicare Other

## 2014-08-04 ENCOUNTER — Encounter (HOSPITAL_COMMUNITY): Payer: Medicare Other

## 2014-08-04 ENCOUNTER — Telehealth (HOSPITAL_COMMUNITY): Payer: Self-pay | Admitting: *Deleted

## 2014-08-05 ENCOUNTER — Ambulatory Visit (INDEPENDENT_AMBULATORY_CARE_PROVIDER_SITE_OTHER): Payer: Medicare Other | Admitting: Pulmonary Disease

## 2014-08-05 ENCOUNTER — Encounter: Payer: Self-pay | Admitting: Pulmonary Disease

## 2014-08-05 VITALS — BP 140/100 | HR 85 | Temp 98.0°F | Ht 75.0 in | Wt 271.6 lb

## 2014-08-05 DIAGNOSIS — J849 Interstitial pulmonary disease, unspecified: Secondary | ICD-10-CM | POA: Diagnosis not present

## 2014-08-05 MED ORDER — PREDNISONE 5 MG PO TABS: ORAL_TABLET | ORAL | Status: DC

## 2014-08-05 NOTE — Patient Instructions (Signed)
Stay on 10 mg prednisone in May & June After June 15 th, drop to 5 mg prednisone Check your sugar once every couple of weeks & call us if high

## 2014-08-05 NOTE — Assessment & Plan Note (Signed)
He has improved with prednisone - wold like to avoid biopsy - will persist x 3 months , then taper slowly Stay on 10 mg prednisone in May & June After June 15 th, drop to 5 mg prednisone Check your sugar once every couple of weeks & call us if high

## 2014-08-05 NOTE — Progress Notes (Signed)
   Subjective:    Patient ID: Kenneth [name], male    DOB: 06/11/1945, 69 y.o.   MRN: [id number]  HPI  69 year old remote smoker with psoriasis  for FU of ILD He presented with  dyspnea and repeated episodes of Chest congestion.  He has atrial fibrillation for 15 years s/p atrial ablation 01/2014 . He takes flecainide if his heart rate gets too high.  He was on Enbrel and methotrexate weekly for 3 years -now switched to Humira.  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 Follow-up    Patient says that prednisone made him nauseated. He has gained his weight back after he stopped using the Prednisone.  Patient went to Pulmonary Rehab and got signed in with them, but has not been back yet.  Patient did not do 6 minute walk that was recommended by Rehab.  Heart rate has increased lately. doing well on current medications.   Lost some wt - was taking pred at night  Caused insomnia - now down to 10 mg in am- tolerating better , CBG ok Started pulm rehab Wife having surgery today -hence bP high  HR creeping high Off methotrexate & enbrel - on humira  Takes CPM for allergies  He reports a constant sinus drip and a dry cough. Dyspnea is actually much improved- can walk longer distances  Significant tests/ events   CT angiogram 09/2007 which shows bilateral groundglass opacities but a repeat CT in 10/2007 and shows resolution of these infiltrates. Myocardial perfusion imaging on 11/2011 was normal.  01/27/2014 HRCT - patchy areas of very mild ground-glass attenuation and subpleural reticulation. Could not perform PFTs, Desatn to 94% on walking  Spirometry 06/21/14 -moderate restriction FVC 50%, ratio normal   Review of Systems neg  for any significant sore throat, dysphagia, itching, sneezing, nasal congestion or excess/ purulent secretions, fever, chills, sweats, unintended wt loss, pleuritic or exertional cp, hempoptysis, orthopnea pnd or change in chronic leg swelling. Also denies presyncope, palpitations, heartburn, abdominal pain, nausea, vomiting, diarrhea or change in bowel or urinary habits, dysuria,hematuria, rash, arthralgias, visual complaints, headache, numbness weakness or ataxia.     Objective:   Physical Exam  Gen. Pleasant, obese, in no distress, normal affect ENT - no lesions, no post nasal drip, class 2-3 airway Neck: No JVD, no thyromegaly, no carotid bruits Lungs: no use of accessory muscles, no dullness to percussion, decreased without rales or rhonchi  Cardiovascular: Rhythm regular, heart sounds  normal, no murmurs or gallops, no peripheral edema Abdomen: soft and non-tender, no hepatosplenomegaly, BS normal. Musculoskeletal: No deformities, no cyanosis or clubbing Neuro:  alert, non focal, no tremors        Assessment & Plan:

## 2014-08-08 ENCOUNTER — Telehealth (HOSPITAL_COMMUNITY): Payer: Self-pay | Admitting: *Deleted

## 2014-08-09 ENCOUNTER — Ambulatory Visit (HOSPITAL_COMMUNITY): Payer: Medicare Other

## 2014-08-09 ENCOUNTER — Encounter (HOSPITAL_COMMUNITY)
Admission: RE | Admit: 2014-08-09 | Discharge: 2014-08-09 | Disposition: A | Discharge status: Home / Self Care | Payer: Medicare Other | Source: Ambulatory Visit | Attending: Pulmonary Disease | Admitting: Pulmonary Disease

## 2014-08-09 DIAGNOSIS — J8410 Pulmonary fibrosis, unspecified: Secondary | ICD-10-CM | POA: Diagnosis not present

## 2014-08-09 DIAGNOSIS — J849 Interstitial pulmonary disease, unspecified: Secondary | ICD-10-CM | POA: Insufficient documentation

## 2014-08-11 ENCOUNTER — Telehealth (HOSPITAL_COMMUNITY): Payer: Self-pay | Admitting: *Deleted

## 2014-08-11 ENCOUNTER — Ambulatory Visit (HOSPITAL_COMMUNITY): Payer: Medicare Other

## 2014-08-11 ENCOUNTER — Encounter (HOSPITAL_COMMUNITY): Payer: Medicare Other

## 2014-08-11 NOTE — Telephone Encounter (Signed)
Pt called in stating that at pulmonary rehab yesterday he noticed his SBP over 200 and HR was 115 with walking test and his resting HR was in 90s.  States this is elevated for him - he then went on to say he recently started back on prednisone and wondered if this might have contributed.  I told him that prednisone can cause an increase in afib occurrence and cause elevated HR.  He is supposed to decrease his dose of prednisone over the next week.  I told him to call back if HR/BP continue to be elevated after decreasing doses of prednisone and we could possibly adjust his medications.  He also was concerned about having muscle pain in his left upper chest "above his heart".  Stated its a 3 on the pain scale but no other symptoms are noted, it does not seem to be caused by any certain activity, and the pain goes away on its own.  Told pt he had normal stress test last year and did not sound cardiac related; pt states it is possibly a pulled muscle.  He will continue to monitor this and call back if further issues or it becomes worse.

## 2014-08-16 ENCOUNTER — Encounter (HOSPITAL_COMMUNITY)
Admission: RE | Admit: 2014-08-16 | Discharge: 2014-08-16 | Disposition: A | Discharge status: Home / Self Care | Payer: Medicare Other | Source: Ambulatory Visit | Attending: Pulmonary Disease | Admitting: Pulmonary Disease

## 2014-08-16 ENCOUNTER — Ambulatory Visit (HOSPITAL_COMMUNITY): Payer: Medicare Other

## 2014-08-16 DIAGNOSIS — J8410 Pulmonary fibrosis, unspecified: Secondary | ICD-10-CM | POA: Diagnosis not present

## 2014-08-16 NOTE — Progress Notes (Signed)
Today, Brazen exercised at [employer]. Service time was from 1030 to 1230.  The patient exercised by performing aerobic, strengthening, and stretching exercises. Oxygen saturation, heart rate, blood pressure, rate of perceived exertion, and shortness of breath were all monitored before, during, and after exercise. Siler presented with no problems at today's exercise session. Logon also attended an education session on anatomy and physiology of the respiratory system and intimacy.  The patient did not have an increase in workload intensity during today's exercise session.  Pre-exercise vitals: . Weight kg: 123.3 . Liters of O2: ra . SpO2: 96 . HR: 95 . BP: 152/80 . CBG: na  Exercise vitals: . Highest heartrate:  114 . Lowest oxygen saturation: 96 . Highest blood pressure: 180/70 . Liters of 02: ra  Post-exercise vitals: . SpO2: 96 . HR: 98 . BP: 122/70 . Liters of O2: ra . CBG: na  Dr. Brand Males, Medical Director Dr. Algis Liming is immediately available during today's Pulmonary Rehab session for MARCELIS WISSNER on 08/16/2014 at 1030 class time.

## 2014-08-18 ENCOUNTER — Encounter (HOSPITAL_COMMUNITY): Payer: Medicare Other

## 2014-08-18 ENCOUNTER — Ambulatory Visit (HOSPITAL_COMMUNITY): Payer: Medicare Other

## 2014-08-23 ENCOUNTER — Ambulatory Visit (HOSPITAL_COMMUNITY): Payer: Medicare Other

## 2014-08-23 ENCOUNTER — Telehealth (HOSPITAL_COMMUNITY): Payer: Self-pay | Admitting: *Deleted

## 2014-08-23 ENCOUNTER — Encounter (HOSPITAL_COMMUNITY)
Admission: RE | Admit: 2014-08-23 | Discharge: 2014-08-23 | Disposition: A | Discharge status: Home / Self Care | Payer: Medicare Other | Source: Ambulatory Visit | Attending: Pulmonary Disease | Admitting: Pulmonary Disease

## 2014-08-23 DIAGNOSIS — J8410 Pulmonary fibrosis, unspecified: Secondary | ICD-10-CM | POA: Diagnosis not present

## 2014-08-23 NOTE — Progress Notes (Addendum)
During today's pulmonary rehab exercise session Kenneth [name]'s BP elevated to 180/80 while exercising. He denied all complaints associated with hypertension. See exercise  progress note in epic. He has had episodes of hypertension during each of his encounters here in pulmonary rehab. Unfortunately with his SBP in the 180s we are unable to increase his workload on the equipment which is a significant part of the program. He has notified [name], RN with the afib clinic on a previous ocation but was also on prednisone at that time. She instructed him to call back if BP remained elevated during exercise. Today I notified her of his exercise vitals and she stated she would see if his medication could be adjusted and would notify patient with further instructions.

## 2014-08-23 NOTE — Telephone Encounter (Addendum)
Had a call from pulmonary rehab regarding patient blood pressure continuing to be elevated during activity and they are unable to increase his rehab due to his SBP.  Called and left message for patient to call back to discuss.  Discussed with Kenneth Palau, NP and patient --- will restart his cardizem cd 120mg  once daily. Recheck him next week - appointment made. Patient was agreeable to this plan

## 2014-08-23 NOTE — Progress Notes (Signed)
Today, Kenneth [name] exercised at [employer]. Service time was from 10:30am to 12:25pm.  The patient exercised by performing aerobic, strengthening, and stretching exercises. Oxygen saturation, heart rate, blood pressure, rate of perceived exertion, and shortness of breath were all monitored before, during, and after exercise. [name] presented with no problems at today's exercise session.  The patient did not have an increase in workload intensity during today's exercise session.  Pre-exercise vitals: . Weight kg: 124.1 . Liters of O2: ra . SpO2: 95 . HR: 89 . BP: 140/86 . CBG: na  Exercise vitals: . Highest heartrate:  119 . Lowest oxygen saturation: 93 . Highest blood pressure: 180/80 . Liters of 02: ra  Post-exercise vitals: . SpO2: 97 . HR: 96 . BP: 134/70 . Liters of O2: ra . CBG: na  Dr. Brand Males, Medical Director Dr. Wendee Beavers is immediately available during today's Pulmonary Rehab session for Kenneth [name] on 08/23/14 at 10:30am class time.

## 2014-08-24 MED ORDER — DILTIAZEM HCL ER COATED BEADS 120 MG PO CP24: 120.0000 mg | ORAL_CAPSULE | Freq: Every day | ORAL | Status: DC

## 2014-08-25 ENCOUNTER — Ambulatory Visit (HOSPITAL_COMMUNITY)
Admission: RE | Admit: 2014-08-25 | Discharge: 2014-08-25 | Disposition: A | Discharge status: Home / Self Care | Payer: Medicare Other | Source: Ambulatory Visit | Attending: Nurse Practitioner | Admitting: Nurse Practitioner

## 2014-08-25 ENCOUNTER — Encounter (HOSPITAL_COMMUNITY): Payer: Self-pay | Admitting: Nurse Practitioner

## 2014-08-25 ENCOUNTER — Encounter (HOSPITAL_COMMUNITY)
Admission: RE | Admit: 2014-08-25 | Discharge: 2014-08-25 | Disposition: A | Discharge status: Home / Self Care | Payer: Medicare Other | Source: Ambulatory Visit | Attending: Pulmonary Disease | Admitting: Pulmonary Disease

## 2014-08-25 ENCOUNTER — Ambulatory Visit (HOSPITAL_COMMUNITY): Payer: Medicare Other

## 2014-08-25 ENCOUNTER — Telehealth (HOSPITAL_COMMUNITY): Payer: Self-pay | Admitting: *Deleted

## 2014-08-25 VITALS — BP 160/86 | HR 95 | Ht 75.0 in | Wt 271.0 lb

## 2014-08-25 DIAGNOSIS — J8410 Pulmonary fibrosis, unspecified: Secondary | ICD-10-CM | POA: Diagnosis not present

## 2014-08-25 DIAGNOSIS — I159 Secondary hypertension, unspecified: Secondary | ICD-10-CM

## 2014-08-25 NOTE — Progress Notes (Signed)
Today, Kenneth [name] exercised at [employer]. Service time was from 10:30am to 12:20pm.  The patient exercised by performing aerobic, strengthening, and stretching exercises. Oxygen saturation, heart rate, blood pressure, rate of perceived exertion, and shortness of breath were all monitored before, during, and after exercise. Kenneth [name] presented with no problems at today's exercise session.  The patient did not have an increase in workload intensity during today's exercise session.  Pre-exercise vitals: . Weight kg: 123.1 . Liters of O2: ra . SpO2: 98 . HR: 81 . BP: 144/70 . CBG: na  Exercise vitals: . Highest heartrate:  95 . Lowest oxygen saturation: 95 . Highest blood pressure: 166/84 . Liters of 02: ra  Post-exercise vitals: . SpO2: 97 . HR: 84 . BP: 144/78 . Liters of O2: ra . CBG: na  Dr. Brand Males, Medical Director Dr. Ree Kida is immediately available during today's Pulmonary Rehab session for Kenneth [name] on 08/25/14 at 10:30am class time.

## 2014-08-25 NOTE — Telephone Encounter (Addendum)
pts wife had called and left message on voicemail regarding concerns that her husbands bp was 130/60 and hr 90 upon starting new cardizem medication.  Called back and left message reassuring pt/wife that this is an acceptable blood pressure/heart rate and he would tolerate medication ok with these readings.  Told to call back if further questions.   Patient arrived as walk- in at clinic -- see donna carroll office note

## 2014-08-25 NOTE — Progress Notes (Signed)
Patient ID: Kenneth [name], male   DOB: 03/15/1946, 69 y.o.   MRN: [id number]       PCP: Garnett Farm, MD  Pulmonologist: Dr. Autumn Patty is a 69 y.o. male who presents today in afib clinic s/p afib/flutter ablation 10/15. He reports doing well from a heart standpoint without any awareness of irregular heartbeat. He is off daily antiarrythmic and blood thinner. He does have interstitial lung disease and is on daily prednisone and attending pulmonary rehab.  Received a call from pulmonary rehab, reporting pt's BP and HR had been elevated before and after exercise for several of the exercise sessions(160-180 sys) and was requesting further eval and possible medication adjustment. He had been on cardizem in the past( for afib rate control) and tolerated and so he was placed on cardizem 120 mg qd, which he started taking yesterday. He asked to be seen today because his BP last night was 130 sys and he thought this was too low. Despite a BP of 160 mg sys in the office today, pt is not convinced that he needs BP med and he thought rule of thumb was 100 plus your age. Weight is stable at 271 and he has not noticed any fluid accumulation. He thinks prednisone is contributing to BP issues but it has definitely helped his breathing.Feels washed today and thinks it is from the cardizem started yesterday. Home BP cuff was compared to office readings and systolic reading found to be lower x 10 points.    Today, he denies symptoms of palpitations, chest pain,  lower extremity edema, slightly dizziness today, no presyncope, or syncope.  The patient is otherwise without complaint today as for above.  Past Medical History  Diagnosis Date  . Paroxysmal atrial fibrillation     a. 11/2003 Tikosyn initiated - subsequently d/c'd;  b. 2006 RFCA for Afib @ [facility];  c. 09/2011 Echo: EF 60-65%, Gr 2 DD;  d. 10/2011 DCCV  . Psoriasis   . Dyspepsia   . Deafness   . Kidney stones   . Hyperlipidemia   . Macular  degeneration     pt reports that he actually has a "retina problem' not MD  . Pneumonia 2009    in context of Flu  . Atypical chest pain     a. 06/2003 Cath:  nl cors;  b. 11/2006 MV: no ischemia, nl LV  . Cataract   . COPD (chronic obstructive pulmonary disease)   . Diverticulosis of colon (without mention of hemorrhage) 1999, 2006  . Hiatal hernia 1999  . Esophagitis 1999  . Adenomatous polyp of colon 2006  . Arthritis   . Skin cancer     on leg, left arm   Past Surgical History  Procedure Laterality Date  . Rotator cuff repair    . Atrial fibrillation ablation  2006    Dr Rolland Porter at [facility]  . Eye surgery Bilateral     cataracts with lens implants  . Appendectomy  1996  . Tonsillectomy    . Cardioversion N/A 05/22/2013    Procedure: CARDIOVERSION;  Surgeon: [name], MD;  Location: [facility] OR;  Service: Cardiovascular;  Laterality: N/A;  . Nasal sinus surgery      x 2  . Cardioversion N/A 10/23/2011    Procedure: CARDIOVERSION;  Surgeon: Deboraha Sprang, MD;  Location: [facility] CATH LAB;  Service: Cardiovascular;  Laterality: N/A;  . Cardioversion N/A 05/01/2012    Procedure: CARDIOVERSION;  Surgeon: Deboraha Sprang, MD;  Location: [facility] CATH LAB;  Service: Cardiovascular;  Laterality: N/A;  . Atrial fibrillation ablation N/A 01/13/2014    repeat PVI, also left atrial ablation performed with successfull ablation of LA flutter by Dr Rayann Heman    ROS- all systems are reviewed and negatives except as per HPI above  Current Outpatient Prescriptions  Medication Sig Dispense Refill  . Adalimumab 40 MG/0.8ML PNKT Inject 40 mg into the skin every 14 (fourteen) days. Fridays    . albuterol (PROVENTIL HFA;VENTOLIN HFA) 108 (90 BASE) MCG/ACT inhaler Inhale 1 puff into the lungs every 6 (six) hours as needed for wheezing or shortness of breath. 1 Inhaler 3  . atorvastatin (LIPITOR) 40 MG tablet Take 20 mg by mouth daily.    . budesonide-formoterol (SYMBICORT) 160-4.5 MCG/ACT inhaler Inhale 2 puffs into  the lungs 2 (two) times daily. 1 Inhaler 6  . cetirizine (ZYRTEC) 10 MG tablet Take 10 mg by mouth daily.    . chlorpheniramine (CHLOR-TRIMETON) 4 MG tablet Take 4 mg by mouth daily as needed for allergies or rhinitis.     Marland Kitchen diltiazem (CARDIZEM CD) 120 MG 24 hr capsule Take 1 capsule (120 mg total) by mouth daily. 30 capsule 3  . flecainide (TAMBOCOR) 100 MG tablet Take 300 mg by mouth as needed. Take 300mg  if heart rate reaches 140 beats per minute and monitor heart rate for 3 hours, if not returned to normal-go to [facility] ER    . Magnesium 400 MG CAPS Take 400 mg by mouth daily.     . naproxen sodium (ANAPROX) 220 MG tablet Take 220 mg by mouth 2 (two) times daily as needed (pain).    Marland Kitchen omeprazole (PRILOSEC) 20 MG capsule Take 20 mg by mouth 2 (two) times daily before a meal.     . predniSONE (DELTASONE) 5 MG tablet Take as directed by physician 90 tablet 2  . Probiotic Product (ALIGN) 4 MG CAPS Take 4 mg by mouth daily.    Marland Kitchen zolpidem (AMBIEN) 10 MG tablet Take 10 mg by mouth at bedtime as needed for sleep.      No current facility-administered medications for this encounter.   Facility-Administered Medications Ordered in Other Encounters  Medication Dose Route Frequency Provider Last Rate Last Dose  . lidocaine (cardiac) 100 mg/63ml (XYLOCAINE) 20 MG/ML injection 2%    Anesthesia Intra-op Rokoshi T Flowers, CRNA   60 mg at 03/25/13 1617  . propofol (DIPRIVAN) 10 mg/mL bolus/IV push    Anesthesia Intra-op Durwin Glaze Flowers, CRNA   90 mg at 03/25/13 1617    Physical Exam: Filed Vitals:   08/25/14 0946  BP: 160/86  Pulse: 95  Height: 6\' 3"  (1.905 m)  Weight: 271 lb (122.925 kg)    GEN- The patient is well appearing, alert and oriented x 3 today.   Head- normocephalic, atraumatic Eyes-  Sclera clear, conjunctiva pink Ears- hearing intact Oropharynx- clear Lungs- No basilar crackles heard today.  Normal work of breathing Heart- Regular rate and rhythm, no murmurs, rubs or gallops,  PMI not laterally displaced. GI- soft, NT, ND, + BS Extremities- no clubbing, cyanosis, or edema   Assessment and Plan:  1. Afib/ atypical flutter Doing well s/p ablation wtihout recurrence He has prn flecainde that he can take. chads2vasc score is 1. Off xarelto per pt wishes.  Per guidelines, this is a reasonable option.  2. ILD Continue prednisone at 5 mg a day. Pulmonary rehab as scheduled.  3. HTN Encouraged to continue Cardizem and check BP  readings at home. He is going to buy a new cuff since found to be inaccurate in the office. He will call the office next week with BP readings. If still does not feel like he is tolerating Cardizem, will change antihypertensive. Avoid salt.    F/u Dr. Rayann Heman as scheduled Dr. Elsworth Soho later in April as scheduled.

## 2014-08-30 ENCOUNTER — Ambulatory Visit (HOSPITAL_COMMUNITY): Payer: Medicare Other | Admitting: Nurse Practitioner

## 2014-08-30 ENCOUNTER — Encounter (HOSPITAL_COMMUNITY): Payer: Medicare Other

## 2014-08-30 ENCOUNTER — Ambulatory Visit (HOSPITAL_COMMUNITY): Payer: Medicare Other

## 2014-09-01 ENCOUNTER — Ambulatory Visit (HOSPITAL_COMMUNITY): Payer: Medicare Other

## 2014-09-01 ENCOUNTER — Encounter (HOSPITAL_COMMUNITY): Payer: Medicare Other

## 2014-09-06 ENCOUNTER — Ambulatory Visit (HOSPITAL_COMMUNITY): Payer: Medicare Other

## 2014-09-06 ENCOUNTER — Encounter (HOSPITAL_COMMUNITY): Payer: Medicare Other

## 2014-09-08 ENCOUNTER — Ambulatory Visit (HOSPITAL_COMMUNITY): Payer: Medicare Other

## 2014-09-08 ENCOUNTER — Encounter (HOSPITAL_COMMUNITY): Admission: RE | Admit: 2014-09-08 | Payer: Medicare Other | Source: Ambulatory Visit

## 2014-09-08 ENCOUNTER — Telehealth (HOSPITAL_COMMUNITY): Payer: Self-pay

## 2014-09-13 ENCOUNTER — Ambulatory Visit (HOSPITAL_COMMUNITY): Payer: Medicare Other

## 2014-09-13 ENCOUNTER — Telehealth (HOSPITAL_COMMUNITY): Payer: Self-pay | Admitting: *Deleted

## 2014-09-13 ENCOUNTER — Encounter (HOSPITAL_COMMUNITY)
Admission: RE | Admit: 2014-09-13 | Discharge: 2014-09-13 | Disposition: A | Discharge status: Home / Self Care | Payer: Medicare Other | Source: Ambulatory Visit | Attending: Pulmonary Disease | Admitting: Pulmonary Disease

## 2014-09-13 DIAGNOSIS — J8410 Pulmonary fibrosis, unspecified: Secondary | ICD-10-CM | POA: Insufficient documentation

## 2014-09-13 DIAGNOSIS — J849 Interstitial pulmonary disease, unspecified: Secondary | ICD-10-CM | POA: Diagnosis not present

## 2014-09-13 NOTE — Progress Notes (Signed)
Kenneth [name] was hypertensive again today, he has been as high as 200/62.  He has exercised with [place] 4 different sessions and each time he has been 180/70-200/62.  We decrease his workload or tell him to slow down and his blood pressure does decrease approximately 10- 20 mmHg, but we are unable to increase his workloads for him to get the full benefits of the program.  I called and left a message with Roderic Palau, nurse practitioner at the A. Fib clinic, patient is a patient of Dr. Rayann Heman.  Given instructions to contact patient with further instructions.

## 2014-09-13 NOTE — Progress Notes (Addendum)
Today, Kenneth [name] exercised at [employer]. Service time was from 10:30am to 12:10pm.  The patient exercised by performing aerobic, strengthening, and stretching exercises. Oxygen saturation, heart rate, blood pressure, rate of perceived exertion, and shortness of breath were all monitored before, during, and after exercise. Kenneth [name] was hypertensive at today's exercise session.  Dr. Jackalyn Lombard office was called to address the hypertension.    The patient did not have an increase in workload intensity during today's exercise session.  Pre-exercise vitals: . Weight kg: 124.5 . Liters of O2: ra . SpO2: 97 . HR: 84 . BP: 140/60 . CBG: na  Exercise vitals: . Highest heartrate:  108 . Lowest oxygen saturation: 92 . Highest blood pressure: 200/62 . Liters of 02: ra  Post-exercise vitals: . SpO2: 95 . HR: 88 . BP: 120/66 . Liters of O2: ra . CBG: na  Dr. Brand Males, Medical Director [name] is immediately available during today's Pulmonary Rehab session for Kenneth [name] on 09/13/14 at 10:30am class time.

## 2014-09-13 NOTE — Telephone Encounter (Signed)
Called and talked with patient regarding issues with BP at pulmonary rehab. Pt very frustrated with the whole concept of pulmonary rehab stating "they hold me back.Marland KitchenMarland KitchenI can do a lot more than they allow me to do because I feel fine." Reports BP runs 140-160s/60-70 at home every day and the readings at pulmonary rehab must not be correct or affected by the prednisone he is on.  Patient states he will be seeing his primary physician on Tuesday of next week and he will discuss any further titration of his cardizem for BP control.  Encouraged patient to talk with Dr. Elsworth Soho regarding pulmonary rehab frustrations because patient questions effectiveness of the rehab if they do not let him push himself.  Patient was agreeable to this plan. He will call Afib clinic as needed but will follow up with his primary physician regarding BP.

## 2014-09-15 ENCOUNTER — Encounter (HOSPITAL_COMMUNITY): Payer: Medicare Other

## 2014-09-15 ENCOUNTER — Ambulatory Visit (HOSPITAL_COMMUNITY): Payer: Medicare Other

## 2014-09-19 ENCOUNTER — Telehealth (HOSPITAL_COMMUNITY): Payer: Self-pay | Admitting: *Deleted

## 2014-09-20 ENCOUNTER — Encounter (HOSPITAL_COMMUNITY): Admission: RE | Admit: 2014-09-20 | Payer: Medicare Other | Source: Ambulatory Visit

## 2014-09-20 ENCOUNTER — Ambulatory Visit (HOSPITAL_COMMUNITY): Payer: Medicare Other

## 2014-09-22 ENCOUNTER — Encounter (HOSPITAL_COMMUNITY): Admission: RE | Admit: 2014-09-22 | Payer: Medicare Other | Source: Ambulatory Visit

## 2014-09-22 ENCOUNTER — Ambulatory Visit (HOSPITAL_COMMUNITY): Payer: Medicare Other

## 2014-09-27 ENCOUNTER — Ambulatory Visit (HOSPITAL_COMMUNITY): Payer: Medicare Other

## 2014-09-27 ENCOUNTER — Encounter (HOSPITAL_COMMUNITY): Payer: Medicare Other

## 2014-09-27 ENCOUNTER — Telehealth (HOSPITAL_COMMUNITY): Payer: Self-pay

## 2014-09-27 NOTE — Telephone Encounter (Signed)
Called and spoke with Peggy about his lack of attendance to pulmonary rehab. Tracey stated that he is having to travel to [place] several weeks at a time. He also stated he has just been diagnosed with diabetes and is still trying to control his htn. It is a mutual agreement that Olusegun will be discharged from pulmonary rehab until he is able to fully commit to the exercise and education program. Encouraged Aryon to continue to exercise as much as possible.

## 2014-09-29 ENCOUNTER — Ambulatory Visit (HOSPITAL_COMMUNITY): Payer: Medicare Other

## 2014-09-29 ENCOUNTER — Encounter (HOSPITAL_COMMUNITY): Payer: Medicare Other

## 2014-10-04 ENCOUNTER — Encounter (HOSPITAL_COMMUNITY): Payer: Medicare Other

## 2014-10-04 ENCOUNTER — Ambulatory Visit (HOSPITAL_COMMUNITY): Payer: Medicare Other

## 2014-10-06 ENCOUNTER — Ambulatory Visit (HOSPITAL_COMMUNITY): Payer: Medicare Other

## 2014-10-06 ENCOUNTER — Encounter (HOSPITAL_COMMUNITY): Payer: Medicare Other

## 2014-10-06 NOTE — Telephone Encounter (Signed)
Pulmonary Rehab Discharge Note: Kenneth [name] has been discharged from pulmonary rehab because of his failure to commit fully to the program at this time. I spoke with Ordean on 6/21 about his lack of attendance. Brantley stated he was still attempting to regulate his hypertension and has been diagnoses with diabetes. The biggest barrier to his participation is his weekly travel to [place] for work. Davyon was encouraged to continue to exercise when time and travel allows. He did not meet his personal goal of loosing weight and improving his breathing. He did not have a follow up PFT during his initial visits to determine if his lung function had improved. This was Earnest's second personal goal.

## 2014-10-11 ENCOUNTER — Encounter (HOSPITAL_COMMUNITY): Payer: Medicare Other

## 2014-10-11 ENCOUNTER — Ambulatory Visit (HOSPITAL_COMMUNITY): Payer: Medicare Other

## 2014-10-13 ENCOUNTER — Encounter (HOSPITAL_COMMUNITY): Payer: Medicare Other

## 2014-10-13 ENCOUNTER — Ambulatory Visit (HOSPITAL_COMMUNITY): Payer: Medicare Other

## 2014-10-18 ENCOUNTER — Encounter (HOSPITAL_COMMUNITY): Payer: Medicare Other

## 2014-10-18 ENCOUNTER — Ambulatory Visit (HOSPITAL_COMMUNITY): Payer: Medicare Other

## 2014-10-20 ENCOUNTER — Encounter (HOSPITAL_COMMUNITY): Payer: Medicare Other

## 2014-10-20 ENCOUNTER — Ambulatory Visit (HOSPITAL_COMMUNITY): Payer: Medicare Other

## 2014-10-25 ENCOUNTER — Ambulatory Visit (HOSPITAL_COMMUNITY): Payer: Medicare Other

## 2014-10-25 ENCOUNTER — Encounter (HOSPITAL_COMMUNITY): Payer: Medicare Other

## 2014-10-27 ENCOUNTER — Ambulatory Visit (HOSPITAL_COMMUNITY): Payer: Medicare Other

## 2014-10-27 ENCOUNTER — Encounter (HOSPITAL_COMMUNITY): Payer: Medicare Other

## 2014-11-01 ENCOUNTER — Ambulatory Visit (HOSPITAL_COMMUNITY): Payer: Medicare Other

## 2014-11-01 ENCOUNTER — Encounter (HOSPITAL_COMMUNITY): Payer: Medicare Other

## 2014-11-03 ENCOUNTER — Encounter (HOSPITAL_COMMUNITY): Payer: Medicare Other

## 2014-11-03 ENCOUNTER — Ambulatory Visit (HOSPITAL_COMMUNITY): Payer: Medicare Other

## 2014-11-07 ENCOUNTER — Ambulatory Visit (INDEPENDENT_AMBULATORY_CARE_PROVIDER_SITE_OTHER): Payer: Medicare Other | Admitting: Internal Medicine

## 2014-11-07 ENCOUNTER — Encounter: Payer: Self-pay | Admitting: Internal Medicine

## 2014-11-07 VITALS — BP 138/80 | HR 85 | Ht 75.0 in | Wt 257.8 lb

## 2014-11-07 DIAGNOSIS — I481 Persistent atrial fibrillation: Secondary | ICD-10-CM | POA: Diagnosis not present

## 2014-11-07 DIAGNOSIS — I4819 Other persistent atrial fibrillation: Secondary | ICD-10-CM

## 2014-11-07 NOTE — Patient Instructions (Signed)
Medication Instructions:  No changes were made to your medications today  Labwork: N/A  Testing/Procedures: N/A  Follow-Up: Your physician wants you to follow-up in: 3 months with Roderic Palau, NP in the AFIB Clinic  Any Other Special Instructions Will Be Listed Below (If Applicable). N/A

## 2014-11-07 NOTE — Progress Notes (Signed)
PCP: Garnett Farm, MD  Kenneth [name] is a 69 y.o. male who presents today for routine electrophysiology followup.  Since his last visit, the patient reports doing very well. He has had no symptoms of afib/ atrial flutter.   He remains active.  Today, he denies symptoms of palpitations, chest pain,  lower extremity edema, dizziness, presyncope, or syncope.  The patient is otherwise without complaint today.   Past Medical History  Diagnosis Date  . Paroxysmal atrial fibrillation     a. 11/2003 Tikosyn initiated - subsequently d/c'd;  b. 2006 RFCA for Afib @ [facility];  c. 09/2011 Echo: EF 60-65%, Gr 2 DD;  d. 10/2011 DCCV  . Psoriasis   . Dyspepsia   . Deafness   . Kidney stones   . Hyperlipidemia   . Macular degeneration     pt reports that he actually has a "retina problem' not MD  . Pneumonia 2009    in context of Flu  . Atypical chest pain     a. 06/2003 Cath:  nl cors;  b. 11/2006 MV: no ischemia, nl LV  . Cataract   . COPD (chronic obstructive pulmonary disease)   . Diverticulosis of colon (without mention of hemorrhage) 1999, 2006  . Hiatal hernia 1999  . Esophagitis 1999  . Adenomatous polyp of colon 2006  . Arthritis   . Skin cancer     on leg, left arm   Past Surgical History  Procedure Laterality Date  . Rotator cuff repair    . Atrial fibrillation ablation  2006    Dr Rolland Porter at [facility]  . Eye surgery Bilateral     cataracts with lens implants  . Appendectomy  1996  . Tonsillectomy    . Cardioversion N/A 05/22/2013    Procedure: CARDIOVERSION;  Surgeon: [name], MD;  Location: [facility] OR;  Service: Cardiovascular;  Laterality: N/A;  . Nasal sinus surgery      x 2  . Cardioversion N/A 10/23/2011    Procedure: CARDIOVERSION;  Surgeon: Deboraha Sprang, MD;  Location: [facility] CATH LAB;  Service: Cardiovascular;  Laterality: N/A;  . Cardioversion N/A 05/01/2012    Procedure: CARDIOVERSION;  Surgeon: Deboraha Sprang, MD;  Location: [facility] CATH LAB;  Service: Cardiovascular;  Laterality:  N/A;  . Atrial fibrillation ablation N/A 01/13/2014    repeat PVI, also left atrial ablation performed with successfull ablation of LA flutter by Dr Rayann Heman    ROS- all systems are reviewed and negatives except as per HPI above  Current Outpatient Prescriptions  Medication Sig Dispense Refill  . Adalimumab 40 MG/0.8ML PNKT Inject 40 mg into the skin every 14 (fourteen) days. Fridays    . albuterol (PROVENTIL HFA;VENTOLIN HFA) 108 (90 BASE) MCG/ACT inhaler Inhale 1 puff into the lungs every 6 (six) hours as needed for wheezing or shortness of breath. 1 Inhaler 3  . atorvastatin (LIPITOR) 40 MG tablet Take 20 mg by mouth daily.    . budesonide-formoterol (SYMBICORT) 160-4.5 MCG/ACT inhaler Inhale 2 puffs into the lungs 2 (two) times daily. 1 Inhaler 6  . cetirizine (ZYRTEC) 10 MG tablet Take 10 mg by mouth daily.    . chlorpheniramine (CHLOR-TRIMETON) 4 MG tablet Take 4 mg by mouth daily as needed for allergies or rhinitis.     Marland Kitchen diltiazem (CARDIZEM CD) 120 MG 24 hr capsule Take 1 capsule (120 mg total) by mouth daily. 30 capsule 3  . flecainide (TAMBOCOR) 100 MG tablet Take 300 mg by mouth as needed. Take  300mg  if heart rate reaches 140 beats per minute and monitor heart rate for 3 hours, if not returned to normal-go to [facility] ER    . Magnesium 400 MG CAPS Take 400 mg by mouth daily.     . naproxen sodium (ANAPROX) 220 MG tablet Take 220 mg by mouth 2 (two) times daily as needed (pain).    Marland Kitchen omeprazole (PRILOSEC) 20 MG capsule Take 20 mg by mouth 2 (two) times daily before a meal.     . predniSONE (DELTASONE) 5 MG tablet Take as directed by physician 90 tablet 2  . Probiotic Product (ALIGN) 4 MG CAPS Take 4 mg by mouth daily.    Marland Kitchen zolpidem (AMBIEN) 10 MG tablet Take 10 mg by mouth at bedtime as needed for sleep.      No current facility-administered medications for this visit.   Facility-Administered Medications Ordered in Other Visits  Medication Dose Route Frequency Provider Last Rate  Last Dose  . lidocaine (cardiac) 100 mg/71ml (XYLOCAINE) 20 MG/ML injection 2%    Anesthesia Intra-op Rokoshi T Flowers, CRNA   60 mg at 03/25/13 1617  . propofol (DIPRIVAN) 10 mg/mL bolus/IV push    Anesthesia Intra-op Durwin Glaze Flowers, CRNA   90 mg at 03/25/13 1617    Physical Exam: Filed Vitals:   11/07/14 1119  BP: 138/80  Pulse: 85  Height: 6\' 3"  (1.905 m)  Weight: 116.937 kg (257 lb 12.8 oz)    GEN- The patient is well appearing, alert and oriented x 3 today.   Head- normocephalic, atraumatic Eyes-  Sclera clear, conjunctiva pink Ears- hearing intact Oropharynx- clear Lungs- few dry basilar rales normal work of breathing Heart- Regular rate and rhythm, no murmurs, rubs or gallops, PMI not laterally displaced GI- soft, NT, ND, + BS Extremities- no clubbing, cyanosis, or edema  ekg today reveals sinus rhythm    Assessment and Plan:  1. Afib/ atypical flutter Doing well s/p ablation wtihout recurrence He has prn flecainde that he can take chads2vasc score is 1.  He is therefore not on anticoagulation  Return to see Roderic Palau NP every 3 months in the AF clinic and I will see when needed

## 2014-11-08 ENCOUNTER — Ambulatory Visit (HOSPITAL_COMMUNITY): Payer: Medicare Other

## 2014-11-08 ENCOUNTER — Encounter (HOSPITAL_COMMUNITY): Payer: Medicare Other

## 2014-11-10 ENCOUNTER — Encounter (HOSPITAL_COMMUNITY): Payer: Medicare Other

## 2014-11-10 ENCOUNTER — Ambulatory Visit (HOSPITAL_COMMUNITY): Payer: Medicare Other

## 2014-11-15 ENCOUNTER — Encounter (HOSPITAL_COMMUNITY): Payer: Medicare Other

## 2014-11-15 ENCOUNTER — Ambulatory Visit (HOSPITAL_COMMUNITY): Payer: Medicare Other

## 2014-11-17 ENCOUNTER — Ambulatory Visit (HOSPITAL_COMMUNITY): Payer: Medicare Other

## 2014-11-17 ENCOUNTER — Encounter (HOSPITAL_COMMUNITY): Payer: Medicare Other

## 2014-11-22 ENCOUNTER — Encounter (HOSPITAL_COMMUNITY): Payer: Medicare Other

## 2014-11-24 ENCOUNTER — Encounter (HOSPITAL_COMMUNITY): Payer: Medicare Other

## 2014-11-29 ENCOUNTER — Encounter (HOSPITAL_COMMUNITY): Payer: Medicare Other

## 2014-12-01 ENCOUNTER — Encounter (HOSPITAL_COMMUNITY): Payer: Medicare Other

## 2014-12-06 ENCOUNTER — Encounter (HOSPITAL_COMMUNITY): Payer: Medicare Other

## 2014-12-06 ENCOUNTER — Ambulatory Visit (INDEPENDENT_AMBULATORY_CARE_PROVIDER_SITE_OTHER)
Admission: RE | Admit: 2014-12-06 | Discharge: 2014-12-06 | Disposition: A | Discharge status: Home / Self Care | Payer: Medicare Other | Source: Ambulatory Visit | Attending: Adult Health | Admitting: Adult Health

## 2014-12-06 ENCOUNTER — Ambulatory Visit (INDEPENDENT_AMBULATORY_CARE_PROVIDER_SITE_OTHER): Payer: Medicare Other | Admitting: Adult Health

## 2014-12-06 ENCOUNTER — Encounter: Payer: Self-pay | Admitting: Adult Health

## 2014-12-06 ENCOUNTER — Telehealth: Payer: Self-pay | Admitting: Pulmonary Disease

## 2014-12-06 VITALS — BP 134/62 | HR 84 | Temp 97.6°F | Ht 75.0 in | Wt 260.0 lb

## 2014-12-06 DIAGNOSIS — I5032 Chronic diastolic (congestive) heart failure: Secondary | ICD-10-CM

## 2014-12-06 DIAGNOSIS — J849 Interstitial pulmonary disease, unspecified: Secondary | ICD-10-CM | POA: Diagnosis not present

## 2014-12-06 DIAGNOSIS — J4521 Mild intermittent asthma with (acute) exacerbation: Secondary | ICD-10-CM | POA: Diagnosis not present

## 2014-12-06 NOTE — Patient Instructions (Signed)
Restart Symbicort 2 puffs Twice daily  , rinse after use.  Chest xray today.  Increase prednisone 10mg  daily .  Follow up Dr. Elsworth Soho  In 6 weeks and As needed

## 2014-12-06 NOTE — Progress Notes (Signed)
   Subjective:    Patient ID: RANARD HARTE, male    DOB: 24-Jun-1945, 69 y.o.   MRN: [id number]  HPI  69 year old remote smoker (cigars)  with psoriasis  for FU of ILD He presented with  dyspnea and repeated episodes of Chest congestion.  He has atrial fibrillation for 15 years s/p atrial ablation 01/2014 . He takes flecainide if his heart rate gets too high.  He was on Enbrel and methotrexate weekly for 3 years -now switched to Humira.  He worked for a [employer] and is now retired, he works on Insurance underwriter with a Advice worker He smokes cigars quit about 20 years ago  He reports a diagnosis of obstructive sleep apnea at the [place] years ago, but is not interested in pursuing this further  Significant tests/ events  CT angiogram 09/2007 which shows bilateral groundglass opacities but a repeat CT in 10/2007 and shows resolution of these infiltrates. Myocardial perfusion imaging on 11/2011 was normal.  01/27/2014 HRCT - patchy areas of very mild ground-glass attenuation and subpleural reticulation. Could not perform PFTs, Desatn to 94% on walking Spirometry 06/21/14 -moderate restriction FVC 50%, ratio normal CT chest HRCT 12/28/13  Some patchy areas of very mild ground-glass attenuation and subpleural reticulation  12/06/2014 Acute OV  : ILD with underlying Psorasis  Pt complains of  increased SOB, wheezing and dry cough since prednisone dosage was decreased to 5 mg in June 2016.  Does get winded with activity . No dyspnea at rest  Denies chest pain, orthopnea or fever.  Seen 4 months ago , prednisone was decreased. Now on prednisone 5mg  daily   No increased edema. Wt trending down, has been dieting .  Quit  pulmonary rehab . , could not keep up with appointments.  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  Mows grass on riding mower.  Not taking symbicort. Does not feel he needed it.  On Humira for psorasis . No flare .  No desats in office today with walk test on room  air.   Review of Systems neg for any significant sore throat, dysphagia, itching, sneezing, nasal congestion or excess/ purulent secretions, fever, chills, sweats, unintended wt loss, pleuritic or exertional cp, hempoptysis, orthopnea pnd or change in chronic leg swelling. Also denies presyncope, palpitations, heartburn, abdominal pain, nausea, vomiting, diarrhea or change in bowel or urinary habits, dysuria,hematuria, rash, arthralgias, visual complaints, headache, numbness weakness or ataxia.     Objective:   Physical Exam  Gen. Pleasant, obese, in no distress, normal affect ENT - no lesions, no post nasal drip, class 2-3 airway Neck: No JVD, no thyromegaly, no carotid bruits Lungs: no use of accessory muscles, no dullness to percussion, decreased without rales or rhonchi  Cardiovascular: Rhythm regular, heart sounds  normal, no murmurs or gallops, no peripheral edema Abdomen: soft and non-tender, no hepatosplenomegaly, BS normal. Musculoskeletal: No deformities, no cyanosis or clubbing Neuro:  alert, non focal, no tremors        Assessment & Plan:

## 2014-12-06 NOTE — Telephone Encounter (Signed)
Pt has been scheduled with TP today at 4pm. Nothing further was needed.

## 2014-12-06 NOTE — Assessment & Plan Note (Signed)
?   Flare with lower prednisone dose.  Check CXR today .   Plan  Restart Symbicort 2 puffs Twice daily  , rinse after use.  Chest xray today.  Increase prednisone 10mg  daily .  Follow up Dr. Elsworth Soho  In 6 weeks and As needed

## 2014-12-06 NOTE — Assessment & Plan Note (Signed)
Appears to be compensated  Cont on current regimen .

## 2014-12-06 NOTE — Assessment & Plan Note (Signed)
?   Asthma in past , previous smoker -cigars ? Flare off Symbicort   Plan  Restart Symbicort 2 puffs Twice daily  , rinse after use.  Chest xray today.  Increase prednisone 10mg  daily .  Follow up Dr. Elsworth Soho  In 6 weeks and As needed

## 2014-12-08 ENCOUNTER — Encounter (HOSPITAL_COMMUNITY): Payer: Medicare Other

## 2014-12-08 ENCOUNTER — Telehealth: Payer: Self-pay | Admitting: Pulmonary Disease

## 2014-12-08 NOTE — Telephone Encounter (Signed)
Returned pt's call and reviewed TP's results and recs.   Notes Recorded by Melvenia Needles, NP on 12/07/2014 at 10:04 PM Chronic changes with no sign of pna or acute process to explain symtpoms  Continue with ov recs  Please contact office for sooner follow up if symptoms do not improve or worsen or seek emergency care   Pt voiced understanding and had no further questions.  Nothing further needed.

## 2014-12-08 NOTE — Progress Notes (Signed)
Reviewed & agree with plan  

## 2014-12-08 NOTE — Progress Notes (Signed)
Quick Note:  Returned pt's call. Reviewed results and recs. Pt voiced understanding and had no further questions. ______ 

## 2014-12-08 NOTE — Progress Notes (Signed)
Quick Note:  LVM for pt to return call ______ 

## 2014-12-13 ENCOUNTER — Encounter (HOSPITAL_COMMUNITY): Payer: Medicare Other

## 2014-12-15 ENCOUNTER — Encounter (HOSPITAL_COMMUNITY): Payer: Medicare Other

## 2014-12-20 ENCOUNTER — Encounter (HOSPITAL_COMMUNITY): Payer: Medicare Other

## 2014-12-22 ENCOUNTER — Encounter (HOSPITAL_COMMUNITY): Payer: Medicare Other

## 2014-12-27 ENCOUNTER — Encounter (HOSPITAL_COMMUNITY): Payer: Medicare Other

## 2014-12-29 ENCOUNTER — Encounter (HOSPITAL_COMMUNITY): Payer: Medicare Other

## 2015-01-02 ENCOUNTER — Other Ambulatory Visit (HOSPITAL_COMMUNITY): Payer: Self-pay | Admitting: Nurse Practitioner

## 2015-01-03 ENCOUNTER — Encounter (HOSPITAL_COMMUNITY): Payer: Medicare Other

## 2015-01-05 ENCOUNTER — Encounter (HOSPITAL_COMMUNITY): Payer: Medicare Other

## 2015-01-10 ENCOUNTER — Encounter (HOSPITAL_COMMUNITY): Payer: Medicare Other

## 2015-01-12 ENCOUNTER — Encounter (HOSPITAL_COMMUNITY): Payer: Medicare Other

## 2015-01-17 ENCOUNTER — Encounter (HOSPITAL_COMMUNITY): Payer: Medicare Other

## 2015-01-19 ENCOUNTER — Encounter (HOSPITAL_COMMUNITY): Payer: Medicare Other

## 2015-01-23 ENCOUNTER — Encounter: Payer: Self-pay | Admitting: Pulmonary Disease

## 2015-01-23 ENCOUNTER — Ambulatory Visit (INDEPENDENT_AMBULATORY_CARE_PROVIDER_SITE_OTHER): Payer: Medicare Other | Admitting: Pulmonary Disease

## 2015-01-23 VITALS — BP 138/88 | HR 67 | Ht 75.0 in | Wt 257.2 lb

## 2015-01-23 DIAGNOSIS — J849 Interstitial pulmonary disease, unspecified: Secondary | ICD-10-CM

## 2015-01-23 DIAGNOSIS — G4733 Obstructive sleep apnea (adult) (pediatric): Secondary | ICD-10-CM

## 2015-01-23 NOTE — Progress Notes (Signed)
   Subjective:    Patient ID: Kenneth [name], male    DOB: Feb 25, 1946, 69 y.o.   MRN: [id number]  HPI 69 year old remote smoker (cigars)  with psoriasis  for FU of steroid responsive ILD  He presented with  dyspnea and repeated episodes of Chest congestion.  He has atrial fibrillation for 15 years s/p atrial ablation 01/2014 . He takes flecainide if his heart rate gets too high.  He was on Enbrel and methotrexate weekly for 3 years -now switched to Humira.  He worked for a [employer] and is now retired, he works on Insurance underwriter with a Advice worker He smokes cigars quit about 20 years ago  He reports a diagnosis of obstructive sleep apnea at the [place] years ago, but is not interested in pursuing this further    01/23/2015  Chief Complaint  Patient presents with  . Follow-up    ILD. Pt states that his breathing is doing well. Pt states that he is taking medications as directed and denies any regular albuterol HFA use.   Was on 10mg  pred since 07/2014 -dropped to 5 mg in 09/2014 & had flare up by OV 12/06/2014 >> pred increased to 10 mg Feels better than when symptoms started in 2015 , can do more, mows a lot of acreage Takes Symbicort once daily No increased edema. Wt trending down, has been dieting .  Quit  pulmonary rehab . , could not keep up with appointments.  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  On Humira for psorasis . No flare .    Significant tests/ events CT angiogram 09/2007 - bilateral groundglass opacities but a repeat CT in 10/2007 showed resolution of these infiltrates.  Myocardial perfusion imaging on 11/2011 was normal.   CT chest HRCT 12/2013  Some patchy areas of very mild ground-glass attenuation and subpleural reticulation 01/2014 HRCT - patchy areas of very mild ground-glass attenuation and subpleural reticulation. Could not perform PFTs, Desatn to 94% on walking  Spirometry 06/21/14 -moderate restriction FVC 50%, ratio normal  ANA ,  RA neg  Review of Systems neg for any significant sore throat, dysphagia, itching, sneezing, nasal congestion or excess/ purulent secretions, fever, chills, sweats, unintended wt loss, pleuritic or exertional cp, hempoptysis, orthopnea pnd or change in chronic leg swelling. Also denies presyncope, palpitations, heartburn, abdominal pain, nausea, vomiting, diarrhea or change in bowel or urinary habits, dysuria,hematuria, rash, arthralgias, visual complaints, headache, numbness weakness or ataxia.     Objective:   Physical Exam  Gen. Pleasant, obese, in no distress ENT - no lesions, no post nasal drip Neck: No JVD, no thyromegaly, no carotid bruits Lungs: no use of accessory muscles, no dullness to percussion, fine crackles right base , left clear , no rhonchi  Cardiovascular: Rhythm regular, heart sounds  normal, no murmurs or gallops, no peripheral edema Musculoskeletal: No deformities, no cyanosis or clubbing , no tremors       Assessment & Plan:

## 2015-01-23 NOTE — Patient Instructions (Signed)
CT scan & breathing test Based on this, we may drop your dose of prednisone Until then stay on 5 mg daily

## 2015-01-24 NOTE — Assessment & Plan Note (Signed)
His ILD seems to be steroid responsive Stay on 5 mg of prednisone for now. He seems to be back to his baseline after the mild flareup in 11/2014 We will repeat PFTs and a high resolution CT chest. If infiltrates and lung function are improved, steroids can be tapered slowly to off He is up-to-date with flu shot

## 2015-01-24 NOTE — Assessment & Plan Note (Signed)
Not interested in therapy

## 2015-02-01 ENCOUNTER — Ambulatory Visit (INDEPENDENT_AMBULATORY_CARE_PROVIDER_SITE_OTHER)
Admission: RE | Admit: 2015-02-01 | Discharge: 2015-02-01 | Disposition: A | Discharge status: Home / Self Care | Payer: Medicare Other | Source: Ambulatory Visit | Attending: Pulmonary Disease | Admitting: Pulmonary Disease

## 2015-02-01 DIAGNOSIS — J849 Interstitial pulmonary disease, unspecified: Secondary | ICD-10-CM

## 2015-02-03 ENCOUNTER — Telehealth: Payer: Self-pay | Admitting: Pulmonary Disease

## 2015-02-03 NOTE — Telephone Encounter (Signed)
Patient notified of CT results.  Patient wants to know what is causing the inflammation in his lungs and he wants to know how much longer he needs to continue on 5mg  of Prednisone?   Patient aware that RA is out of office until next week and okay with waiting on his response.  RA - please advise.

## 2015-02-06 NOTE — Telephone Encounter (Signed)
Stay on 5mg  x 3 months , then OV - if doing OK, will taper slowly

## 2015-02-06 NOTE — Telephone Encounter (Signed)
Spoke with pt's wife. She is aware the pt needs to keep taking 5mg  of prednisone. Has upcoming appointment in January. Nothing further was needed.

## 2015-02-06 NOTE — Telephone Encounter (Signed)
Left message for patient to return call.

## 2015-02-06 NOTE — Telephone Encounter (Signed)
[phone], pt cb

## 2015-02-06 NOTE — Telephone Encounter (Signed)
Patient returned call, may be reached at [phone]

## 2015-02-07 ENCOUNTER — Ambulatory Visit (HOSPITAL_COMMUNITY)
Admission: RE | Admit: 2015-02-07 | Discharge: 2015-02-07 | Disposition: A | Discharge status: Home / Self Care | Payer: Medicare Other | Source: Ambulatory Visit | Attending: Nurse Practitioner | Admitting: Nurse Practitioner

## 2015-02-07 ENCOUNTER — Encounter (HOSPITAL_COMMUNITY): Payer: Self-pay | Admitting: Nurse Practitioner

## 2015-02-07 VITALS — BP 148/74 | HR 81 | Ht 75.0 in | Wt 259.0 lb

## 2015-02-07 DIAGNOSIS — J849 Interstitial pulmonary disease, unspecified: Secondary | ICD-10-CM | POA: Diagnosis not present

## 2015-02-07 DIAGNOSIS — I4892 Unspecified atrial flutter: Secondary | ICD-10-CM | POA: Insufficient documentation

## 2015-02-07 DIAGNOSIS — I10 Essential (primary) hypertension: Secondary | ICD-10-CM | POA: Insufficient documentation

## 2015-02-07 NOTE — Progress Notes (Signed)
Patient ID: Kenneth [name], male   DOB: 1946-02-04, 69 y.o.   MRN: [id number] Patient ID: Kenneth [name], male   DOB: 10-Oct-1945, 69 y.o.   MRN: [id number]       PCP: Garnett Farm, MD  Pulmonologist: Dr. Autumn Patty is a 69 y.o. male who presents today in afib clinic s/p afib/flutter ablation 10/15. He reports doing well from a heart standpoint without any awareness of irregular heartbeat. He is off daily antiarrythmic and blood thinner. He does have interstitial lung disease and is on daily prednisone. He has finished pulmonary rehab. He reports that his lung status is improved and he is feeling better than he has in some time. He is maintaining SR, no awareness of afib/flutter. 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   Today, he denies symptoms of palpitations, chest pain,  lower extremity edema, slightly dizziness today, no presyncope, or syncope.  The patient is otherwise without complaint today as for above.  Past Medical History  Diagnosis Date  . Paroxysmal atrial fibrillation ([place])     a. 11/2003 Tikosyn initiated - subsequently d/c'd;  b. 2006 RFCA for Afib @ [facility];  c. 09/2011 Echo: EF 60-65%, Gr 2 DD;  d. 10/2011 DCCV  . Psoriasis   . Dyspepsia   . Deafness   . Kidney stones   . Hyperlipidemia   . Macular degeneration     pt reports that he actually has a "retina problem' not MD  . Pneumonia 2009    in context of Flu  . Atypical chest pain     a. 06/2003 Cath:  nl cors;  b. 11/2006 MV: no ischemia, nl LV  . Cataract   . COPD (chronic obstructive pulmonary disease) ([place])   . Diverticulosis of colon (without mention of hemorrhage) 1999, 2006  . Hiatal hernia 1999  . Esophagitis 1999  . Adenomatous polyp of colon 2006  . Arthritis   . Skin cancer     on leg, left arm   Past Surgical History  Procedure Laterality Date  . Rotator cuff repair    . Atrial fibrillation ablation  2006    Dr Rolland Porter [facility]  . Eye surgery Bilateral    cataracts with lens implants  . Appendectomy  1996  . Tonsillectomy    . Cardioversion N/A 05/22/2013    Procedure: CARDIOVERSION;  Surgeon: [name], MD;  Location: [facility] OR;  Service: Cardiovascular;  Laterality: N/A;  . Nasal sinus surgery      x 2  . Cardioversion N/A 10/23/2011    Procedure: CARDIOVERSION;  Surgeon: Deboraha Sprang, MD;  Location: [facility] CATH LAB;  Service: Cardiovascular;  Laterality: N/A;  . Cardioversion N/A 05/01/2012    Procedure: CARDIOVERSION;  Surgeon: Deboraha Sprang, MD;  Location: [facility] CATH LAB;  Service: Cardiovascular;  Laterality: N/A;  . Atrial fibrillation ablation N/A 01/13/2014    repeat PVI, also left atrial ablation performed with successfull ablation of LA flutter by Dr Rayann Heman    ROS- all systems are reviewed and negatives except as per HPI above  Current Outpatient Prescriptions  Medication Sig Dispense Refill  . Adalimumab 40 MG/0.8ML PNKT Inject 40 mg into the skin every 14 (fourteen) days. Fridays    . albuterol (PROVENTIL HFA;VENTOLIN HFA) 108 (90 BASE) MCG/ACT inhaler Inhale 1 puff into the lungs every 6 (six) hours as needed for wheezing or shortness of breath. 1 Inhaler 3  . atorvastatin (LIPITOR) 40 MG  tablet Take 20 mg by mouth daily.    . budesonide-formoterol (SYMBICORT) 160-4.5 MCG/ACT inhaler Inhale 2 puffs into the lungs 2 (two) times daily. 1 Inhaler 6  . CARTIA XT 120 MG 24 hr capsule TAKE ONE CAPSULE BY MOUTH ONCE DAILY 30 capsule 6  . cetirizine (ZYRTEC) 10 MG tablet Take 10 mg by mouth daily.    . chlorpheniramine (CHLOR-TRIMETON) 4 MG tablet Take 4 mg by mouth daily as needed for allergies or rhinitis.     . flecainide (TAMBOCOR) 100 MG tablet Take 300 mg by mouth as needed. Take 300mg  if heart rate reaches 140 beats per minute and monitor heart rate for 3 hours, if not returned to normal-go to [facility]    . Magnesium 400 MG CAPS Take 400 mg by mouth daily.     . meloxicam (MOBIC) 7.5 MG tablet Take 7.5 mg by mouth at bedtime.      . naproxen sodium (ANAPROX) 220 MG tablet Take 220 mg by mouth 2 (two) times daily as needed (pain).    Marland Kitchen omeprazole (PRILOSEC) 20 MG capsule Take 20 mg by mouth 2 (two) times daily before a meal.     . predniSONE (DELTASONE) 5 MG tablet Take as directed by physician 90 tablet 2  . Probiotic Product (ALIGN) 4 MG CAPS Take 4 mg by mouth daily.    Marland Kitchen zolpidem (AMBIEN) 10 MG tablet Take 10 mg by mouth at bedtime as needed for sleep.      No current facility-administered medications for this encounter.   Facility-Administered Medications Ordered in Other Encounters  Medication Dose Route Frequency Provider Last Rate Last Dose  . lidocaine (cardiac) 100 mg/26ml (XYLOCAINE) 20 MG/ML injection 2%    Anesthesia Intra-op Rokoshi T Flowers, CRNA   60 mg at 03/25/13 1617  . propofol (DIPRIVAN) 10 mg/mL bolus/IV push    Anesthesia Intra-op Durwin Glaze Flowers, CRNA   90 mg at 03/25/13 1617    Physical Exam: Filed Vitals:   02/07/15 1047  BP: 148/74  Pulse: 81  Height: 6\' 3"  (1.905 m)  Weight: 259 lb (117.482 kg)    GEN- The patient is well appearing, alert and oriented x 3 today.   Head- normocephalic, atraumatic Eyes-  Sclera clear, conjunctiva pink Ears- hearing intact Oropharynx- clear Lungs- No basilar crackles heard today.  Normal work of breathing Heart- Regular rate and rhythm, no murmurs, rubs or gallops, PMI not laterally displaced. GI- soft, NT, ND, + BS Extremities- no clubbing, cyanosis, or edema  EKG- NSR at 81 bpm, pr int 174 ms, qrs int 86 ms, qtc 427 ms Epic records reviewed   Assessment and Plan:  1. Afib/ atypical flutter Doing well s/p ablation wtihout recurrence He has prn flecainde that he can take. chads2vasc score is 1. Off xarelto per pt wishes.  Per guidelines, this is a reasonable option.  2. ILD Continue prednisone per pulmonologist  3. HTN Stable   F/u in  afib clinic in 6 months   [name], [facility] [address] [place], [place] [address] [phone]

## 2015-03-11 IMAGING — CR DG CHEST 1V PORT
2 series · 2 of 2 positions shown · non-contrast
Comparison: October 22, 2011.

CLINICAL DATA: Chest pain, shortness of breath.

EXAM:
PORTABLE CHEST - 1 VIEW

[AP (1 of 2)]
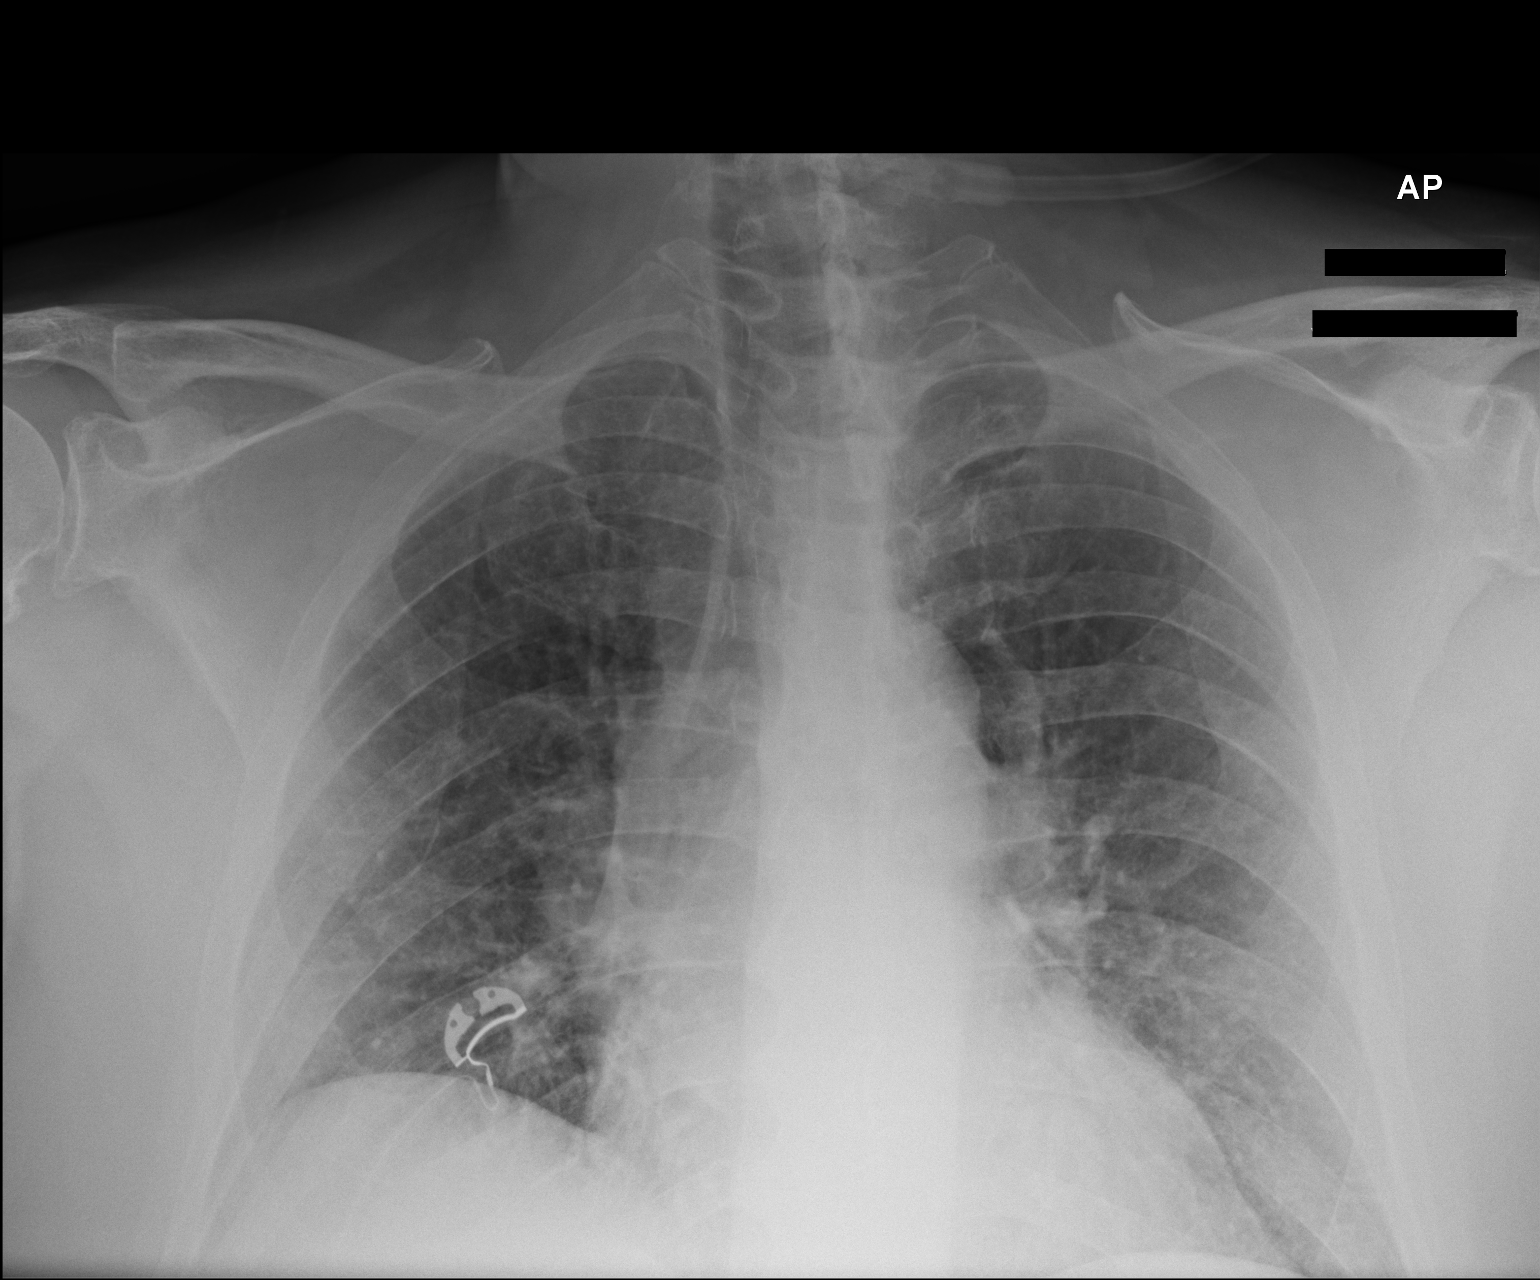

[AP (2 of 2)]
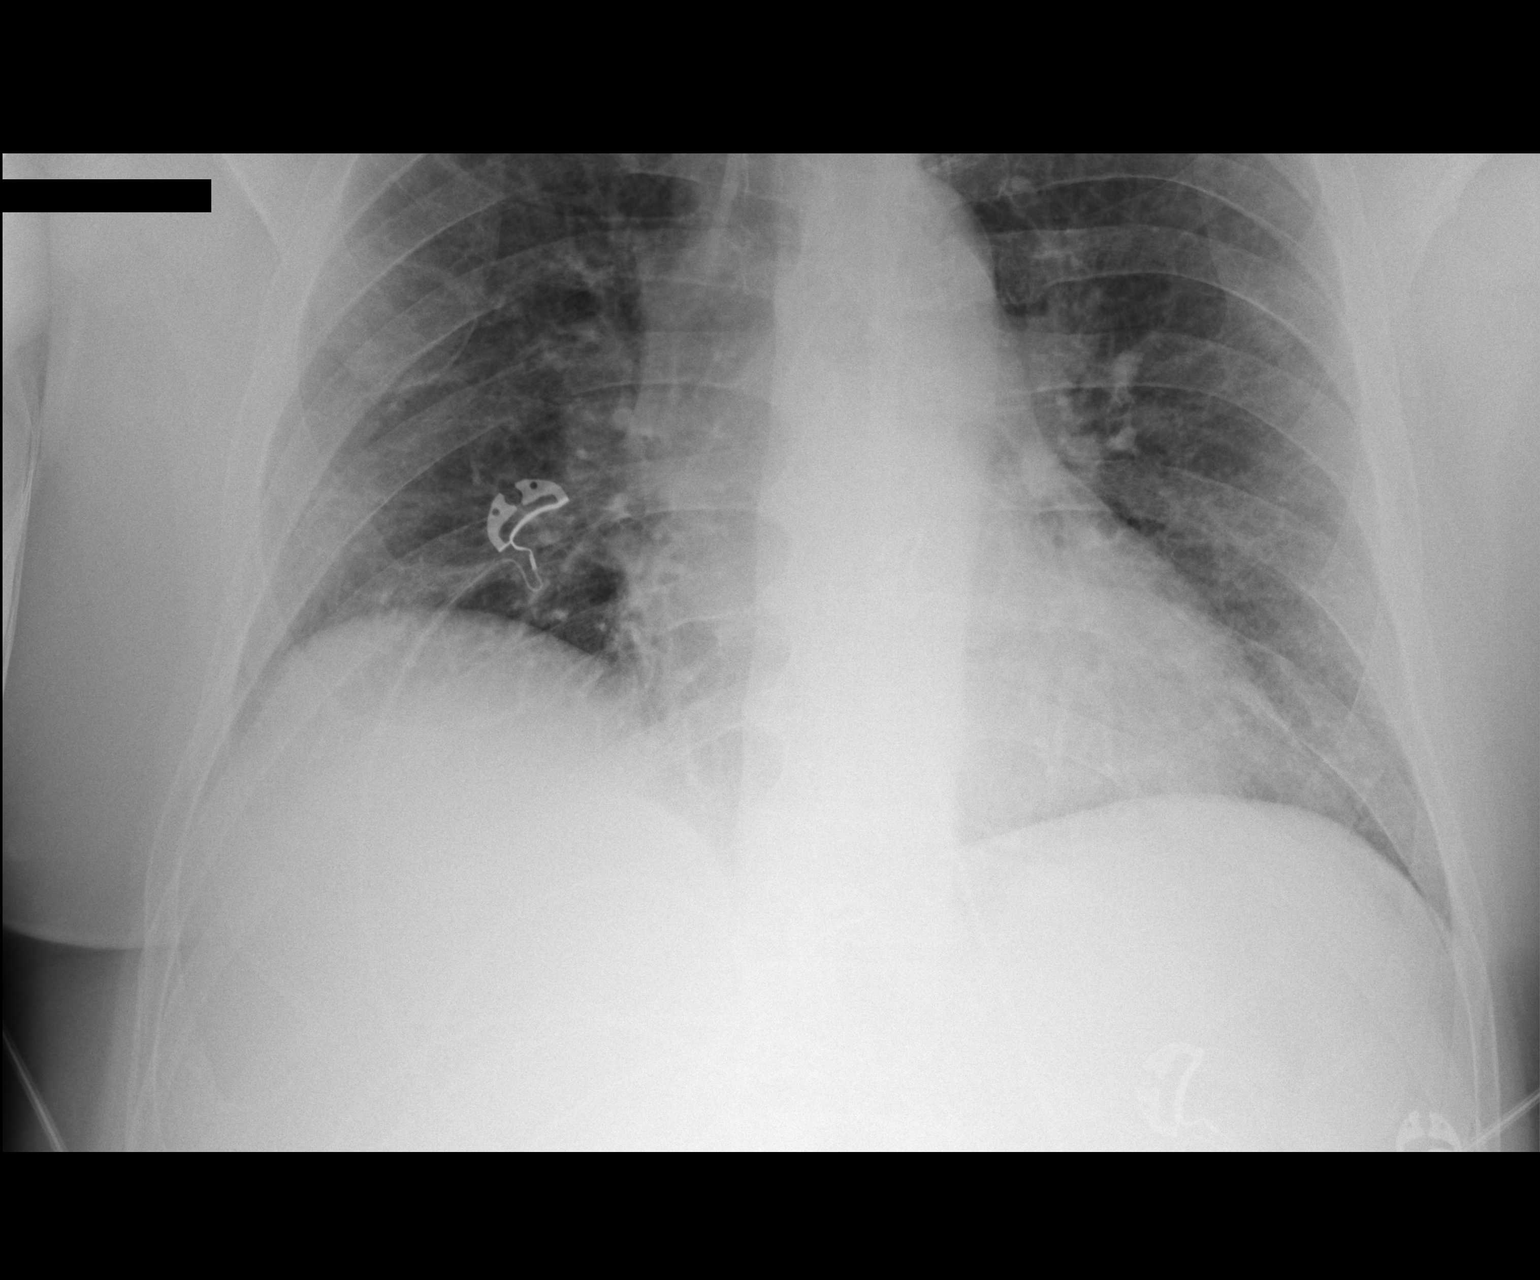

[2 of 2 positions shown; findings below may reference images not displayed]

FINDINGS: The heart size and mediastinal contours are within normal limits.
Both lungs are clear. No pleural effusion or pneumothorax is noted.
The visualized skeletal structures are unremarkable.
IMPRESSION: No acute cardiopulmonary abnormality seen.

## 2015-04-25 ENCOUNTER — Ambulatory Visit (INDEPENDENT_AMBULATORY_CARE_PROVIDER_SITE_OTHER): Payer: Medicare Other | Admitting: Pulmonary Disease

## 2015-04-25 ENCOUNTER — Encounter: Payer: Self-pay | Admitting: Pulmonary Disease

## 2015-04-25 VITALS — BP 142/72 | HR 77 | Ht 75.0 in | Wt 261.0 lb

## 2015-04-25 DIAGNOSIS — J849 Interstitial pulmonary disease, unspecified: Secondary | ICD-10-CM

## 2015-04-25 DIAGNOSIS — G4733 Obstructive sleep apnea (adult) (pediatric): Secondary | ICD-10-CM | POA: Diagnosis not present

## 2015-04-25 LAB — PULMONARY FUNCTION TEST
DL/VA % PRED: 106 %
DL/VA: 5.12 ml/min/mmHg/L
DLCO unc % pred: 67 %
DLCO unc: 25.18 ml/min/mmHg
FEF 25-75 POST: 3.23 L/s
FEF 25-75 Pre: 3.09 L/sec
FEF2575-%Change-Post: 4 %
FEF2575-%PRED-POST: 114 %
FEF2575-%Pred-Pre: 109 %
FEV1-%CHANGE-POST: 1 %
FEV1-%PRED-PRE: 68 %
FEV1-%Pred-Post: 69 %
FEV1-PRE: 2.54 L
FEV1-Post: 2.58 L
FEV1FVC-%Change-Post: 0 %
FEV1FVC-%PRED-PRE: 111 %
FEV6-%Change-Post: 0 %
FEV6-%Pred-Post: 64 %
FEV6-%Pred-Pre: 64 %
FEV6-POST: 3.09 L
FEV6-PRE: 3.07 L
FEV6FVC-%Change-Post: 0 %
FEV6FVC-%PRED-POST: 104 %
FEV6FVC-%PRED-PRE: 105 %
FVC-%Change-Post: 1 %
FVC-%PRED-POST: 61 %
FVC-%PRED-PRE: 60 %
FVC-POST: 3.1 L
FVC-PRE: 3.07 L
POST FEV1/FVC RATIO: 83 %
PRE FEV6/FVC RATIO: 100 %
Post FEV6/FVC ratio: 100 %
Pre FEV1/FVC ratio: 83 %
RV % pred: 27 %
RV: 0.73 L
TLC % pred: 68 %
TLC: 5.26 L

## 2015-04-25 NOTE — Patient Instructions (Signed)
CT scan & lung function are stable Attempt to taper prednisone - Drop to 5 mg  M/W/F once chest cold is better  On March 1st, drop to 5mg  M/ F Can stop in April

## 2015-04-25 NOTE — Progress Notes (Signed)
PFT done today. 

## 2015-04-25 NOTE — Progress Notes (Signed)
   Subjective:    Patient ID: Kenneth [name], male    DOB: 06/15/1945, 70 y.o.   MRN: [id number]  HPI  70 year old remote smoker (cigars)  with psoriasis  for FU of steroid responsive ILD On [employer] for public [employer].  He presented with  dyspnea and repeated episodes of Chest congestion.  He has atrial fibrillation for 15 years s/p atrial ablation 01/2014 . He takes flecainide if his heart rate gets too high.  He was on Enbrel and methotrexate weekly for 3 years -now switched to Humira.  He worked for a [employer] and is now retired, he works on Insurance underwriter with a Advice worker He smokes cigars quit about 20 years ago  He reports a diagnosis of obstructive sleep apnea at the [place] years ago, but is not interested in pursuing this further  04/25/2015  Chief Complaint  Patient presents with  . Follow-up    PFT done today. Pt c/o dry cough, sinus congestion, SOB and wheeze for about 10 days. Pt denies CP/tightness. Pt states that he has been using Symbicort and Albuteroil HFA when needed. Pt also c/o occasional heart palpitations.    Wife was sick with cough - he may have got it Clear phlegm Breathing remains poor with physical activity - cannot mow etc  Was on 10mg  pred since 07/2014 -dropped to 5 mg in 09/2014 & had flare up by OV 12/06/2014 >> pred increased to 10 mg Feels better than when symptoms started in 2015 , can do more, mows a lot of acreage Off Symbicort No increased edema. Wt trending down, has been dieting .  Quit  pulmonary rehab . , could not keep up with appointments.    On Humira for psorasis . No flare .   We reviewed CT scan and PFTs today  Significant tests/ events CT angiogram 09/2007 - bilateral groundglass opacities but a repeat CT in 10/2007 showed resolution of these infiltrates.  Myocardial perfusion imaging on 11/2011 was normal.   CT chest HRCT 12/2013  Some patchy areas of very mild ground-glass attenuation and subpleural reticulation 01/2014  HRCT - patchy areas of very mild ground-glass attenuation and subpleural reticulation. Could not perform PFTs, Desatn to 94% on walking  Spirometry 06/21/14 -moderate restriction FVC 50%, ratio normal  ANA , RA neg  HRCT 01/2015 >> No appreciable interval change in faint patchy ground-glass opacity throughout both lungs,Given the degree of air trapping,  subacute/chronic hypersensitivity pneumonitis is favored PFT 04/2015 >> ratio nml, FVC 61%, TLC 68%, DLCO 67%  Review of Systems neg for any significant sore throat, dysphagia, itching, sneezing, nasal congestion or excess/ purulent secretions, fever, chills, sweats, unintended wt loss, pleuritic or exertional cp, hempoptysis, orthopnea pnd or change in chronic leg swelling. Also denies presyncope, palpitations, heartburn, abdominal pain, nausea, vomiting, diarrhea or change in bowel or urinary habits, dysuria,hematuria, rash, arthralgias, visual complaints, headache, numbness weakness or ataxia.     Objective:   Physical Exam  Gen. Pleasant, obese, in no distress ENT - no lesions, no post nasal drip Neck: No JVD, no thyromegaly, no carotid bruits Lungs: no use of accessory muscles, no dullness to percussion, decreased without rales or rhonchi  Cardiovascular: Rhythm regular, heart sounds  normal, no murmurs or gallops, no peripheral edema Musculoskeletal: No deformities, no cyanosis or clubbing , no tremors       Assessment & Plan:

## 2015-04-25 NOTE — Assessment & Plan Note (Signed)
Does not want therapy

## 2015-04-25 NOTE — Assessment & Plan Note (Addendum)
CT scan & lung function are stable Attempt to taper prednisone - Drop to 5 mg  M/W/F once chest cold is better  On March 1st, drop to 5mg  M/ F Can stop in April

## 2015-05-02 ENCOUNTER — Telehealth: Payer: Self-pay | Admitting: Pulmonary Disease

## 2015-05-02 MED ORDER — AZITHROMYCIN 250 MG PO TABS: ORAL_TABLET | ORAL | Status: DC

## 2015-05-02 NOTE — Telephone Encounter (Signed)
Spoke with pt's wife, aware of recs.  rx sent to preferred pharmacy.  Nothing further needed.  

## 2015-05-02 NOTE — Telephone Encounter (Signed)
Spoke with pt's wife. States that the pt is now sick from coming into our office on 04/25/15. Reports cough, chest tightness, wheezing, SOB and fever. Cough is producing yellow, brown mucus. Has not tried any OTC medications. Would to have something sent in if possible.  MW - please advise. Thanks.

## 2015-05-02 NOTE — Telephone Encounter (Signed)
Zpak/ ov by end of week if not improving

## 2015-05-23 ENCOUNTER — Other Ambulatory Visit: Payer: Self-pay | Admitting: Pulmonary Disease

## 2015-06-12 DIAGNOSIS — H35373 Puckering of macula, bilateral: Secondary | ICD-10-CM | POA: Diagnosis not present

## 2015-06-12 DIAGNOSIS — H5203 Hypermetropia, bilateral: Secondary | ICD-10-CM | POA: Diagnosis not present

## 2015-06-12 DIAGNOSIS — H43393 Other vitreous opacities, bilateral: Secondary | ICD-10-CM | POA: Diagnosis not present

## 2015-06-12 DIAGNOSIS — H16223 Keratoconjunctivitis sicca, not specified as Sjogren's, bilateral: Secondary | ICD-10-CM | POA: Diagnosis not present

## 2015-06-13 ENCOUNTER — Telehealth (HOSPITAL_COMMUNITY): Payer: Self-pay | Admitting: *Deleted

## 2015-06-13 NOTE — Telephone Encounter (Signed)
Pt called in stating he went out of rhythm last night and is just "weak as water" today. States he has since went back into sinus BP 153/81 Hr 90. Concerned whether he needs to be checked or not. Reassured patient that vitals sound good and it is normal to feel fatigued after an extended episode of afib (states lasted most of the night). If symptoms continue to call back tomorrow and will bring him in for EKG. Also went over reasons to go to ER (chest pain, extreme SOB). Encouraged patient to try and rest today. Patient and wife agree with plan.

## 2015-06-29 ENCOUNTER — Telehealth: Payer: Self-pay | Admitting: Pulmonary Disease

## 2015-06-29 NOTE — Telephone Encounter (Signed)
Spoke with pt's wife. States that he would like to be seen for increased coughing. Reports dry cough, wheezing, chest tightness and SOB. Offered appointment tomorrow with TP tomorrow at 10am. They accepted, they know to seek medical care if his symptoms get worse. Nothing further was needed.

## 2015-06-30 ENCOUNTER — Ambulatory Visit: Payer: Medicare Other | Admitting: Adult Health

## 2015-06-30 ENCOUNTER — Emergency Department (HOSPITAL_COMMUNITY): Payer: Medicare Other

## 2015-06-30 ENCOUNTER — Emergency Department (HOSPITAL_COMMUNITY)
Admission: EM | Admit: 2015-06-30 | Discharge: 2015-06-30 | Disposition: A | Discharge status: Home / Self Care | Payer: Medicare Other | Attending: Emergency Medicine | Admitting: Emergency Medicine

## 2015-06-30 ENCOUNTER — Encounter (HOSPITAL_COMMUNITY): Payer: Self-pay

## 2015-06-30 DIAGNOSIS — M1990 Unspecified osteoarthritis, unspecified site: Secondary | ICD-10-CM | POA: Insufficient documentation

## 2015-06-30 DIAGNOSIS — Z87442 Personal history of urinary calculi: Secondary | ICD-10-CM | POA: Diagnosis not present

## 2015-06-30 DIAGNOSIS — Z8701 Personal history of pneumonia (recurrent): Secondary | ICD-10-CM | POA: Insufficient documentation

## 2015-06-30 DIAGNOSIS — R110 Nausea: Secondary | ICD-10-CM | POA: Insufficient documentation

## 2015-06-30 DIAGNOSIS — J849 Interstitial pulmonary disease, unspecified: Secondary | ICD-10-CM | POA: Diagnosis not present

## 2015-06-30 DIAGNOSIS — Z8639 Personal history of other endocrine, nutritional and metabolic disease: Secondary | ICD-10-CM | POA: Insufficient documentation

## 2015-06-30 DIAGNOSIS — Z86010 Personal history of colonic polyps: Secondary | ICD-10-CM | POA: Diagnosis not present

## 2015-06-30 DIAGNOSIS — H269 Unspecified cataract: Secondary | ICD-10-CM | POA: Diagnosis not present

## 2015-06-30 DIAGNOSIS — Z85828 Personal history of other malignant neoplasm of skin: Secondary | ICD-10-CM | POA: Insufficient documentation

## 2015-06-30 DIAGNOSIS — Z872 Personal history of diseases of the skin and subcutaneous tissue: Secondary | ICD-10-CM | POA: Insufficient documentation

## 2015-06-30 DIAGNOSIS — Z87891 Personal history of nicotine dependence: Secondary | ICD-10-CM | POA: Diagnosis not present

## 2015-06-30 DIAGNOSIS — Z8719 Personal history of other diseases of the digestive system: Secondary | ICD-10-CM | POA: Insufficient documentation

## 2015-06-30 DIAGNOSIS — R55 Syncope and collapse: Secondary | ICD-10-CM | POA: Insufficient documentation

## 2015-06-30 DIAGNOSIS — J069 Acute upper respiratory infection, unspecified: Secondary | ICD-10-CM | POA: Insufficient documentation

## 2015-06-30 DIAGNOSIS — I480 Paroxysmal atrial fibrillation: Secondary | ICD-10-CM | POA: Diagnosis not present

## 2015-06-30 DIAGNOSIS — J441 Chronic obstructive pulmonary disease with (acute) exacerbation: Secondary | ICD-10-CM | POA: Diagnosis not present

## 2015-06-30 DIAGNOSIS — Z79899 Other long term (current) drug therapy: Secondary | ICD-10-CM | POA: Insufficient documentation

## 2015-06-30 DIAGNOSIS — R079 Chest pain, unspecified: Secondary | ICD-10-CM | POA: Diagnosis not present

## 2015-06-30 DIAGNOSIS — R0602 Shortness of breath: Secondary | ICD-10-CM | POA: Diagnosis not present

## 2015-06-30 DIAGNOSIS — J439 Emphysema, unspecified: Secondary | ICD-10-CM | POA: Diagnosis not present

## 2015-06-30 LAB — CBC
HEMATOCRIT: 40.6 % (ref 39.0–52.0)
Hemoglobin: 13.5 g/dL (ref 13.0–17.0)
MCH: 27.2 pg (ref 26.0–34.0)
MCHC: 33.3 g/dL (ref 30.0–36.0)
MCV: 81.9 fL (ref 78.0–100.0)
Platelets: 174 10*3/uL (ref 150–400)
RBC: 4.96 MIL/uL (ref 4.22–5.81)
RDW: 14.1 % (ref 11.5–15.5)
WBC: 5.4 10*3/uL (ref 4.0–10.5)

## 2015-06-30 LAB — BASIC METABOLIC PANEL
Anion gap: 10 (ref 5–15)
BUN: 10 mg/dL (ref 6–20)
CALCIUM: 9.3 mg/dL (ref 8.9–10.3)
CO2: 26 mmol/L (ref 22–32)
CREATININE: 0.93 mg/dL (ref 0.61–1.24)
Chloride: 105 mmol/L (ref 101–111)
GFR calc Af Amer: >60 mL/min (ref >60)
GLUCOSE: 147 mg/dL — AB (ref 65–99)
Potassium: 4 mmol/L (ref 3.5–5.1)
Sodium: 141 mmol/L (ref 135–145)

## 2015-06-30 LAB — I-STAT TROPONIN, ED
Troponin i, poc: 0.01 ng/mL (ref 0.00–0.08)
Troponin i, poc: 0 ng/mL (ref 0.00–0.08)

## 2015-06-30 MED ORDER — ONDANSETRON HCL 4 MG/2ML IJ SOLN
4.0000 mg | Freq: Once | INTRAMUSCULAR | Status: AC
  Administered 2015-06-30: 4 mg via INTRAVENOUS
  Filled 2015-06-30: qty 2

## 2015-06-30 MED ORDER — NITROGLYCERIN 0.4 MG SL SUBL
0.4000 mg | SUBLINGUAL_TABLET | SUBLINGUAL | Status: DC | PRN
  Filled 2015-06-30: qty 1

## 2015-06-30 MED ORDER — IOHEXOL 350 MG/ML SOLN
100.0000 mL | Freq: Once | INTRAVENOUS | Status: AC | PRN
  Administered 2015-06-30: 100 mL via INTRAVENOUS

## 2015-06-30 MED ORDER — MORPHINE SULFATE (PF) 4 MG/ML IV SOLN
4.0000 mg | Freq: Once | INTRAVENOUS | Status: AC
  Administered 2015-06-30: 4 mg via INTRAVENOUS
  Filled 2015-06-30: qty 1

## 2015-06-30 NOTE — ED Notes (Signed)
A family member of another pt approached desk and said that they needed help with another pt in the corner. Went over to pt and he was pale with eyes rolling back in his head. I went and got Bobby from triage and he went and assessed pt. Pt then became nauseas and diaphoretic. Pt was placed in a wheelchair and taken back to a room.

## 2015-06-30 NOTE — ED Notes (Signed)
Patient transported to CT 

## 2015-06-30 NOTE — ED Notes (Signed)
Patient returned from CT

## 2015-06-30 NOTE — ED Provider Notes (Signed)
CSN: [id number]     Arrival date & time 06/30/15  0224 History   First MD Initiated Contact with Patient 06/30/15 0335     Chief Complaint  Patient presents with  . Chest Pain     (Consider location/radiation/quality/duration/timing/severity/associated sxs/prior Treatment) HPI Comments: Sharp pain right side radiating upwards Left side radiating upwards ot neck Cough since Sunday   Patient is a 70 y.o. male presenting with chest pain.  Chest Pain Pain location:  L chest and R chest Pain quality: sharp   Pain radiates to:  Neck Pain radiates to the back: no   Pain severity:  Moderate Onset quality:  Sudden Duration:  3 hours Timing:  Constant Progression:  Unchanged Chronicity:  New Ineffective treatments:  None tried Associated symptoms: cough, nausea (with syncopal episode not cp), shortness of breath and syncope   Associated symptoms: no abdominal pain, no back pain, no diaphoresis (with syncopal episode, none with chest pain), no fever, no headache, no numbness, no palpitations, not vomiting and no weakness   Risk factors: diabetes mellitus ([facility] told him he had DM but he doesnt think he does) and high cholesterol (reports [facility] dr told him he has high cholesterol but he doesnt think so)   Risk factors: no coronary artery disease, no hypertension, no prior DVT/PE, no smoking and no surgery   Risk factors comment:  Hx afib and ablation   Past Medical History  Diagnosis Date  . Paroxysmal atrial fibrillation ([place])     a. 11/2003 Tikosyn initiated - subsequently d/c'd;  b. 2006 RFCA for Afib @ [facility];  c. 09/2011 Echo: EF 60-65%, Gr 2 DD;  d. 10/2011 DCCV  . Psoriasis   . Dyspepsia   . Deafness   . Kidney stones   . Hyperlipidemia   . Macular degeneration     pt reports that he actually has a "retina problem' not MD  . Pneumonia 2009    in context of Flu  . Atypical chest pain     a. 06/2003 Cath:  nl cors;  b. 11/2006 MV: no ischemia, nl LV  . Cataract   . COPD  (chronic obstructive pulmonary disease) ([place])   . Diverticulosis of colon (without mention of hemorrhage) 1999, 2006  . Hiatal hernia 1999  . Esophagitis 1999  . Adenomatous polyp of colon 2006  . Arthritis   . Skin cancer     on leg, left arm   Past Surgical History  Procedure Laterality Date  . Rotator cuff repair    . Atrial fibrillation ablation  2006    Dr Rolland Porter at [facility]  . Eye surgery Bilateral     cataracts with lens implants  . Appendectomy  1996  . Tonsillectomy    . Cardioversion N/A 05/22/2013    Procedure: CARDIOVERSION;  Surgeon: [name], MD;  Location: [facility] OR;  Service: Cardiovascular;  Laterality: N/A;  . Nasal sinus surgery      x 2  . Cardioversion N/A 10/23/2011    Procedure: CARDIOVERSION;  Surgeon: Deboraha Sprang, MD;  Location: [facility] CATH LAB;  Service: Cardiovascular;  Laterality: N/A;  . Cardioversion N/A 05/01/2012    Procedure: CARDIOVERSION;  Surgeon: Deboraha Sprang, MD;  Location: [facility] CATH LAB;  Service: Cardiovascular;  Laterality: N/A;  . Atrial fibrillation ablation N/A 01/13/2014    repeat PVI, also left atrial ablation performed with successfull ablation of LA flutter by Dr Rayann Heman   Family History  Problem Relation Age of Onset  .  Coronary artery disease Mother   . Breast cancer Mother   . Colon cancer Mother   . Heart disease Maternal Grandmother     MI  . Stroke Paternal Grandfather     CVA  . Coronary artery disease Paternal Grandfather   . Esophageal cancer Neg Hx   . Stomach cancer Neg Hx   . Rectal cancer Neg Hx    Social History  Substance Use Topics  . Smoking status: Former Smoker -- 10 years    Types: Cigars    Quit date: 09/29/2001  . Smokeless tobacco: Former Systems developer    Quit date: 09/29/2001  . Alcohol Use: No    Review of Systems  Constitutional: Negative for fever and diaphoresis (with syncopal episode, none with chest pain).  HENT: Positive for congestion and rhinorrhea. Negative for sore throat.   Eyes: Negative for  visual disturbance.  Respiratory: Positive for cough and shortness of breath.   Cardiovascular: Positive for chest pain and syncope. Negative for palpitations.  Gastrointestinal: Positive for nausea (with syncopal episode not cp). Negative for vomiting and abdominal pain.  Genitourinary: Negative for difficulty urinating.  Musculoskeletal: Negative for back pain and neck stiffness.  Skin: Negative for rash.  Neurological: Negative for syncope, weakness, numbness and headaches.      Allergies  Hydrocodone-acetaminophen; Other; Oxycodone-acetaminophen; Pseudoephedrine; and Tramadol  Home Medications   Prior to Admission medications   Medication Sig Start Date End Date Taking? Authorizing Provider  Adalimumab 40 MG/0.8ML PNKT Inject 40 mg into the skin every 14 (fourteen) days. Fridays   Yes Historical Provider, MD  CARTIA XT 120 MG 24 hr capsule TAKE ONE CAPSULE BY MOUTH ONCE DAILY 01/02/15  Yes Sherran Needs, NP  cetirizine (ZYRTEC) 10 MG tablet Take 10 mg by mouth daily.   Yes Historical Provider, MD  flecainide (TAMBOCOR) 100 MG tablet Take 300 mg by mouth as needed. Take 300mg  if heart rate reaches 140 beats per minute and monitor heart rate for 3 hours, if not returned to normal-go to [facility] 01/12/14  Yes [name], MD  Magnesium 400 MG CAPS Take 400 mg by mouth daily.    Yes Historical Provider, MD  omeprazole (PRILOSEC) 20 MG capsule Take 20 mg by mouth 2 (two) times daily before a meal.    Yes Historical Provider, MD  predniSONE (DELTASONE) 5 MG tablet Take 5 mg by mouth See admin instructions. On Mondays and Fridays   Yes Historical Provider, MD  Probiotic Product (ALIGN) 4 MG CAPS Take 4 mg by mouth daily.   Yes Historical Provider, MD  zolpidem (AMBIEN) 10 MG tablet Take 10 mg by mouth at bedtime as needed for sleep.    Yes Historical Provider, MD  albuterol (PROVENTIL HFA;VENTOLIN HFA) 108 (90 BASE) MCG/ACT inhaler Inhale 1 puff into the lungs every 6 (six) hours as  needed for wheezing or shortness of breath. Patient not taking: Reported on 06/30/2015 06/21/14   Rigoberto Noel, MD  azithromycin (ZITHROMAX) 250 MG tablet Take 2 tabs today, then 1 daily until gone. Patient not taking: Reported on 06/30/2015 05/02/15   Tanda Rockers, MD  predniSONE (DELTASONE) 5 MG tablet TAKE AS DIRECTED BY  PHYSICIAN Patient not taking: Reported on 06/30/2015 05/24/15   Rigoberto Noel, MD   BP 114/82 mmHg  Pulse 82  Temp(Src) 98.9 F (37.2 C) (Oral)  Resp 18  Ht 6\' 3"  (1.905 m)  Wt 259 lb (117.482 kg)  BMI 32.37 kg/m2  SpO2 93% Physical  Exam  Constitutional: He is oriented to person, place, and time. He appears well-developed and well-nourished. No distress.  HENT:  Head: Normocephalic and atraumatic.  Eyes: Conjunctivae and EOM are normal.  Neck: Normal range of motion.  Cardiovascular: Normal rate, regular rhythm, normal heart sounds and intact distal pulses.  Exam reveals no gallop and no friction rub.   No murmur heard. Pulmonary/Chest: Effort normal and breath sounds normal. No respiratory distress. He has no wheezes. He has no rales.  Abdominal: Soft. He exhibits no distension. There is no tenderness. There is no guarding.  Musculoskeletal: He exhibits no edema.  Neurological: He is alert and oriented to person, place, and time.  Skin: Skin is warm and dry. He is not diaphoretic.  Nursing note and vitals reviewed.   ED Course  Procedures (including critical care time) Labs Review Labs Reviewed  BASIC METABOLIC PANEL - Abnormal; Notable for the following:    Glucose, Bld 147 (*)    All other components within normal limits  CBC  I-STAT TROPOININ, ED  I-STAT TROPOININ, ED  Randolm Idol, ED    Imaging Review Dg Chest 2 View  06/30/2015  CLINICAL DATA:  70 year old male with chest pain EXAM: CHEST  2 VIEW COMPARISON:  CT dated 02/01/2015 FINDINGS: There is mild emphysematous changes of the lungs with apparent areas of air trapping. Areas of increased  interstitial prominence at the lung bases may be related to underlying interstitial lung disease. Developing pneumonia is not excluded. There is no focal consolidation. No pleural effusion or pneumothorax. Top-normal cardiac silhouette. No acute osseous pathology identified. IMPRESSION: Mild emphysema with areas of air trapping. Bibasilar linear and reticular densities may be related to underlying lung disease. Developing pneumonia is less likely but not excluded. Electronically Signed   By: Anner Crete M.D.   On: 06/30/2015 03:28   Ct Angio Chest Pe W/cm &/or Wo Cm  06/30/2015  CLINICAL DATA:  70 year old male with right-sided chest pain and shortness of breath. EXAM: CT ANGIOGRAPHY CHEST WITH CONTRAST TECHNIQUE: Multidetector CT imaging of the chest was performed using the standard protocol during bolus administration of intravenous contrast. Multiplanar CT image reconstructions and MIPs were obtained to evaluate the vascular anatomy. CONTRAST:  100 cc Omnipaque 350 COMPARISON:  chest radiograph dated 06/30/2015 and CT dated10/26/2016 FINDINGS: Evaluation of this exam is limited due to respiratory motion artifact. There is diffuse interstitial prominence and ground-glass opacity throughout the lungs with mosaic appearance compatible with small airways versus small vessel disease. There is no focal consolidation, pleural effusion, or pneumothorax. The central airways are patent. There is mild atherosclerotic calcification of the thoracic aorta. No aneurysmal dilatation or evidence of dissection. Evaluation of the pulmonary arteries is limited due to streak artifact caused by patient's arms and respiratory motion artifact. No central pulmonary artery embolus identified. Top-normal bilateral hilar lymph nodes noted. There is no mediastinal adenopathy. The esophagus and the thyroid gland appear grossly unremarkable. There is no axillary adenopathy. The chest wall soft tissues appear unremarkable. There is  degenerative changes of the spine. No acute fracture. The visualized upper abdomen appear unremarkable. Review of the MIP images confirms the above findings. IMPRESSION: No CT evidence of central pulmonary artery embolism or thoracic aortic dissection. Diffuse ground-glass density with areas of air trapping. No focal consolidation. Electronically Signed   By: Anner Crete M.D.   On: 06/30/2015 06:32   I have personally reviewed and evaluated these images and lab results as part of my medical decision-making.  EKG Interpretation   Date/Time:  Friday June 30 2015 02:30:43 EDT Ventricular Rate:  91 PR Interval:  134 QRS Duration: 84 QT Interval:  364 QTC Calculation: 447 R Axis:   62 Text Interpretation:  Normal sinus rhythm Normal ECG No significant change  since last tracing Confirmed by [facility] MD, Kendan Cornforth ([id number]) on 06/30/2015  7:40:20 AM      MDM   Final diagnoses:  ILD (interstitial lung disease) ([place])  Chest pain, unspecified chest pain type  URI (upper respiratory infection)  Syncope, unspecified syncope type   70 year-old male with history of atrial fibrillation/flutter, possible hyperlipidemia/DM, ILD, presents with concern of chest pain. Differential diagnosis for chest pain includes pulmonary embolus, dissection, pneumothorax, pneumonia, ACS, myocarditis, pericarditis.  EKG was done and evaluate by me and showed no acute ST changes and no signs of pericarditis. Chest x-ray was done and evaluated by me and radiology and showed no sign of pneumonia or pneumothorax. CT PE study shows no PE, no dissection, no pneumonia.  Patient is low risk HEART score and had delta troponins which were both negative. Given this evaluation, history and physical have low suspicion for pulmonary embolus, pneumonia, ACS, myocarditis, pericarditis, dissection.  Discussed possible admission with patient for chest pain/syncope observation however he declines and will follow up closely with his  cardiologist and pulmonologist. Regarding syncope, no sig anemia or electrolyte abnormalities. No seizure like activity.  EKG evaluated by me and shows sinus rhythm with no sign of prolonged QTc, no brugada, no sign of HOCM, no ST abnormalities.  Pt with preceding lightheadedness an nausea and may represent vasovagal episode. Pt also with URI which may contribute to these symptoms and dehydration.  Patient reports symptomatic improvement of dyspnea with O2 but does not have hypoxia.  Recommend cardiology follow up within next week and follow up with pulmonologist regarding ILD. Patient discharged in stable condition with understanding of reasons to return.     Gareth Morgan, MD 06/30/15 1740

## 2015-06-30 NOTE — ED Notes (Signed)
Pt removed from [place] and sats remain 95%.

## 2015-06-30 NOTE — ED Notes (Addendum)
Pt here with c/o right sided chest "sharp" pain that starts in his lower right chest and moves up vertically to upper right chest, and he also reports the same pain in left chest, associated with SOB and diarrhea. Pain onset this past evening around 9pm. He reports getting over a URI recently.

## 2015-07-01 ENCOUNTER — Encounter: Payer: Self-pay | Admitting: Nurse Practitioner

## 2015-07-03 ENCOUNTER — Encounter: Payer: Self-pay | Admitting: Adult Health

## 2015-07-03 ENCOUNTER — Ambulatory Visit (INDEPENDENT_AMBULATORY_CARE_PROVIDER_SITE_OTHER): Payer: Medicare Other | Admitting: Adult Health

## 2015-07-03 VITALS — BP 146/82 | HR 90 | Temp 97.8°F | Ht 75.0 in | Wt 251.0 lb

## 2015-07-03 DIAGNOSIS — J209 Acute bronchitis, unspecified: Secondary | ICD-10-CM

## 2015-07-03 DIAGNOSIS — J40 Bronchitis, not specified as acute or chronic: Secondary | ICD-10-CM | POA: Insufficient documentation

## 2015-07-03 MED ORDER — AMOXICILLIN-POT CLAVULANATE 875-125 MG PO TABS: 1.0000 | ORAL_TABLET | Freq: Two times a day (BID) | ORAL | Status: AC

## 2015-07-03 NOTE — Assessment & Plan Note (Signed)
Flare   Plan  Augmentin 875mg  Twice daily  For 7 days .  Mucinex DM Twice daily  As needed  Cough/congestion  Increase Prednisone 10mg  daily for 3 days then 5mg  daily for 3 days then back to 5mg  Monday and Friday .  Push fluids .  Try to advance diet as tolerated.  Follow up with Dr. Elsworth Soho  As planned in 2 weeks and As needed   Please contact office for sooner follow up if symptoms do not improve or worsen or seek emergency care

## 2015-07-03 NOTE — Patient Instructions (Signed)
Augmentin 875mg  Twice daily  For 7 days .  Mucinex DM Twice daily  As needed  Cough/congestion  Increase Prednisone 10mg  daily for 3 days then 5mg  daily for 3 days then back to 5mg  Monday and Friday .  Push fluids .  Try to advance diet as tolerated.  Follow up with Dr. Elsworth Soho  As planned in 2 weeks and As needed   Please contact office for sooner follow up if symptoms do not improve or worsen or seek emergency care

## 2015-07-03 NOTE — Progress Notes (Signed)
Subjective:    Patient ID: Kenneth [name], male    DOB: 04-Feb-1946, 70 y.o.   MRN: [id number]  HPI  70 year old remote smoker (cigars)  with psoriasis  for FU of ILD He presented with  dyspnea and repeated episodes of Chest congestion.  He has atrial fibrillation for 15 years s/p atrial ablation 01/2014 . He takes flecainide if his heart rate gets too high.  He was on Enbrel and methotrexate weekly for 3 years -now switched to Humira.  He worked for a [employer] and is now retired, he works on Insurance underwriter with a Advice worker He smokes cigars quit about 20 years ago  He reports a diagnosis of obstructive sleep apnea at the [place] years ago, but is not interested in pursuing this further  Significant tests/ events  CT angiogram 09/2007 which shows bilateral groundglass opacities but a repeat CT in 10/2007 and shows resolution of these infiltrates. Myocardial perfusion imaging on 11/2011 was normal.  01/27/2014 HRCT - patchy areas of very mild ground-glass attenuation and subpleural reticulation. Could not perform PFTs, Desatn to 94% on walking Spirometry 06/21/14 -moderate restriction FVC 50%, ratio normal CT chest HRCT 12/28/13  Some patchy areas of very mild ground-glass attenuation and subpleural reticulation CT CHest HRCT 01/2015 no sing change in ILD   07/03/2015 Acute OV  : ILD with underlying Psorasis  Pt complains of 1 week of prod cough with a small amount of yellow mucus, sinus drainage/congestion, chest tightness/congestion, possible fever and chills at times, SOB and wheezing at times.  Went to ER on 3/24 for chest pain, sob went , CT chest neg for PE., neg tropoinin .  Says he passed out in the ER. Labs were unremarkable. nml CBC  Did not start on abx. Still has cough and congestion.  Wife is sick with URI on abx.  Currently on prednisone 5mg  M and F .  Appetite is down.   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  On Humira for psorasis .   Past  Medical History  Diagnosis Date  . Paroxysmal atrial fibrillation ([place])     a. 11/2003 Tikosyn initiated - subsequently d/c'd;  b. 2006 RFCA for Afib @ [facility];  c. 09/2011 Echo: EF 60-65%, Gr 2 DD;  d. 10/2011 DCCV  . Psoriasis   . Dyspepsia   . Deafness   . Kidney stones   . Hyperlipidemia   . Macular degeneration     pt reports that he actually has a "retina problem' not MD  . Atypical chest pain     a. 06/2003 Cath:  nl cors;  b. 11/2006 MV: no ischemia, nl LV  . Cataract   . COPD (chronic obstructive pulmonary disease) ([place])   . Diverticulosis of colon (without mention of hemorrhage) 1999, 2006  . Hiatal hernia 1999  . Esophagitis 1999  . Adenomatous polyp of colon 2006  . Arthritis   . Skin cancer     on leg, left arm  . Hypertension    Current Outpatient Prescriptions on File Prior to Visit  Medication Sig Dispense Refill  . Adalimumab 40 MG/0.8ML PNKT Inject 40 mg into the skin every 14 (fourteen) days. Fridays    . albuterol (PROVENTIL HFA;VENTOLIN HFA) 108 (90 BASE) MCG/ACT inhaler Inhale 1 puff into the lungs every 6 (six) hours as needed for wheezing or shortness of breath. 1 Inhaler 3  . CARTIA XT 120 MG 24 hr capsule TAKE ONE CAPSULE BY  MOUTH ONCE DAILY 30 capsule 6  . cetirizine (ZYRTEC) 10 MG tablet Take 10 mg by mouth daily.    . flecainide (TAMBOCOR) 100 MG tablet Take 300 mg by mouth as needed. Take 300mg  if heart rate reaches 140 beats per minute and monitor heart rate for 3 hours, if not returned to normal-go to [facility]    . Magnesium 400 MG CAPS Take 400 mg by mouth daily.     Marland Kitchen omeprazole (PRILOSEC) 20 MG capsule Take 20 mg by mouth 2 (two) times daily before a meal.     . predniSONE (DELTASONE) 5 MG tablet Take 5 mg by mouth See admin instructions. On Mondays and Fridays    . Probiotic Product (ALIGN) 4 MG CAPS Take 4 mg by mouth daily.    Marland Kitchen zolpidem (AMBIEN) 10 MG tablet Take 10 mg by mouth at bedtime as needed for sleep.      Current Facility-Administered  Medications on File Prior to Visit  Medication Dose Route Frequency Provider Last Rate Last Dose  . lidocaine (cardiac) 100 mg/22ml (XYLOCAINE) 20 MG/ML injection 2%    Anesthesia Intra-op Rokoshi T Flowers, CRNA   60 mg at 03/25/13 1617  . propofol (DIPRIVAN) 10 mg/mL bolus/IV push    Anesthesia Intra-op Durwin Glaze Flowers, CRNA   90 mg at 03/25/13 1617    Review of Systems neg for any significant sore throat, dysphagia, itching, sneezing, nasal congestion or excess/ purulent secretions, fever, chills, sweats, unintended wt loss, pleuritic or exertional cp, hempoptysis, orthopnea pnd or change in chronic leg swelling. Also denies presyncope, palpitations, heartburn, abdominal pain, nausea, vomiting, diarrhea or change in bowel or urinary habits, dysuria,hematuria, rash, arthralgias, visual complaints, headache, numbness weakness or ataxia.     Objective:   Physical Exam Filed Vitals:   07/03/15 0919  BP: 146/82  Pulse: 90  Temp: 97.8 F (36.6 C)  TempSrc: Oral  Height: 6\' 3"  (1.905 m)  Weight: 251 lb (113.853 kg)  SpO2: 95%   Gen. Pleasant, obese, in no distress, normal affect ENT - no lesions, no post nasal drip, class 2-3 airway Neck: No JVD, no thyromegaly, no carotid bruits Lungs: no use of accessory muscles, no dullness to percussion, few trace rhonchi  Cardiovascular: Rhythm regular, heart sounds  normal, no murmurs or gallops, no peripheral edema Abdomen: soft and non-tender, no hepatosplenomegaly, BS normal. Musculoskeletal: No deformities, no cyanosis or clubbing Neuro:  alert, non focal, no tremors  06/30/15  No CT evidence of central pulmonary artery embolism or thoracic aortic dissection.  Diffuse ground-glass density with areas of air trapping. No focal consolidation.   Tammy Parrett NP-C  [facility]  07/03/2015      Assessment & Plan:

## 2015-07-04 MED ORDER — IOHEXOL 350 MG/ML SOLN
100.0000 mL | Freq: Once | INTRAVENOUS | Status: AC | PRN
  Administered 2015-06-30: 100 mL via INTRAVENOUS

## 2015-07-04 NOTE — Progress Notes (Signed)
Reviewed & agree with plan  

## 2015-07-05 ENCOUNTER — Encounter (HOSPITAL_COMMUNITY): Payer: Self-pay | Admitting: *Deleted

## 2015-07-07 ENCOUNTER — Telehealth: Payer: Self-pay | Admitting: Adult Health

## 2015-07-07 NOTE — Telephone Encounter (Signed)
We do not have any appointments available. Dr. Elsworth Soho, please advise.

## 2015-07-07 NOTE — Telephone Encounter (Signed)
[phone] pt wife cb

## 2015-07-07 NOTE — Telephone Encounter (Signed)
Called spoke with pt. He is aware of TP recs below.  He saw TP on 3/27 and was told to f/u in 2 weeks with RA. Sharyn Lull, please advise were pt can be worked in at. thanks

## 2015-07-07 NOTE — Telephone Encounter (Signed)
Patient states that he did good, staying in the house since Monday, started doing better and now going backwards.  Very fatigued, no energy at all, coughing a lot non-productive.  Patient is taking Prednisone 5mg  daily. Taking antibiotics.   Pharmacy:  [facility]

## 2015-07-07 NOTE — Telephone Encounter (Signed)
Patients wife calling because patient did not write down the usage of prednisone and she wanted to make sure that he was getting the proper dose. Advised her of Tammy's recommendations. Advised her that I will call her back to let her know when Dr. Elsworth Soho wants them to come in. Awaiting message back from Dr. Elsworth Soho to add patient to his schedule in 2 weeks.

## 2015-07-07 NOTE — Telephone Encounter (Signed)
Go up on the prednisone 20mg  daily for 5 days then 15mg  daily for 5 days then 10mg  daily and hold until seen back in here in 6 weeks for scheduled ov . Marland Kitchen Not sure why he is not feeling better.  Recent CT chest was neg for PE. Troponin neg . On 06/30/15 .  Needs ov early next week if not better .  Please contact office for sooner follow up if symptoms do not improve or worsen or seek emergency care  If worse will need to go to ER

## 2015-07-12 NOTE — Telephone Encounter (Signed)
Sharyn Lull, have you heard back on where we can work pt in?

## 2015-07-14 ENCOUNTER — Telehealth: Payer: Self-pay | Admitting: Pulmonary Disease

## 2015-07-14 NOTE — Telephone Encounter (Signed)
Patient scheduled to see Dr. Elsworth Soho 08/23/15.   Per TP's last OV note, she wanted patient to come back to see Dr. Elsworth Soho in 2 weeks. Dr. Elsworth Soho, please advise.

## 2015-07-14 NOTE — Telephone Encounter (Signed)
Called and left message on patient's voicemail advising him to call back to schedule appointment. Awaiting call back from patient to schedule appointment.

## 2015-07-14 NOTE — Telephone Encounter (Signed)
I can see him [place] 4/13 Or on 4/11 at 1:45 PM

## 2015-07-14 NOTE — Telephone Encounter (Signed)
See TE 07/07/15

## 2015-07-14 NOTE — Telephone Encounter (Signed)
Patient scheduled to see Dr. Elsworth Soho on 07/18/2015 @ 1:45pm Patient aware of appointment. Nothing further needed.

## 2015-07-18 ENCOUNTER — Ambulatory Visit (INDEPENDENT_AMBULATORY_CARE_PROVIDER_SITE_OTHER): Payer: Medicare Other | Admitting: Pulmonary Disease

## 2015-07-18 ENCOUNTER — Encounter: Payer: Self-pay | Admitting: Pulmonary Disease

## 2015-07-18 VITALS — BP 142/82 | HR 83 | Ht 75.0 in | Wt 250.4 lb

## 2015-07-18 DIAGNOSIS — J849 Interstitial pulmonary disease, unspecified: Secondary | ICD-10-CM | POA: Diagnosis not present

## 2015-07-18 DIAGNOSIS — J309 Allergic rhinitis, unspecified: Secondary | ICD-10-CM | POA: Diagnosis not present

## 2015-07-18 MED ORDER — PREDNISONE 5 MG PO TABS: 5.0000 mg | ORAL_TABLET | Freq: Every day | ORAL | Status: DC

## 2015-07-18 NOTE — Progress Notes (Signed)
Subjective:    Patient ID: Kenneth [name], male    DOB: 1945-05-11, 70 y.o.   MRN: [id number]  HPI  70 year old remote smoker (cigars)  with psoriasis  for FU of Steroid responsive ILD, favor NSIP He presented with  dyspnea and repeated episodes of Chest congestion.  He has atrial fibrillation for 15 years s/p atrial ablation 01/2014 . He takes flecainide if his heart rate gets too high.  He was on Enbrel and methotrexate weekly for 3 years -switched to Humira.  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  - on 10mg  pred since 07/2014   07/18/2015  Chief Complaint  Patient presents with  . Follow-up    Patient feeling much better.  Finished antibiotics, still on Prednisone.  Discuss ED visit.    Went to ER on 3/24 for chest pain, sob went , CT chest neg for PE., neg tropoinin .  Says he passed out in the ER. Labs were unremarkable. nml CBC   Seems like his wife was sick around the same time with a URI  He had tapered down to 5 mg 2 days a week but then required a prednisone boost starting at 20 mg and is now down again to 10 mg. He has difficulty tolerating prednisone  Wife requests a plan for his breathing exacerbations-just like he is a plan for atrial fibrillation  Significant tests/ events  CT angiogram 09/2007 which shows bilateral groundglass opacities but a repeat CT in 10/2007 and shows resolution of these infiltrates. Myocardial perfusion imaging on 11/2011 was normal.  01/27/2014 HRCT - patchy areas of very mild ground-glass attenuation and subpleural reticulation.  CT chest HRCT 12/28/13  Some patchy areas of very mild ground-glass attenuation and subpleural reticulation CT CHest HRCT 01/2015 no  sig change in ILD   Ct angio 06/2015 GGO  PFT 04/2015 >> ratio nml, FVC 61%, TLC 68%, DLCO 67%  Past Medical History  Diagnosis Date  . Paroxysmal atrial fibrillation ([place])     a. 11/2003 Tikosyn initiated - subsequently d/c'd;  b. 2006 RFCA for Afib @ [facility];  c. 09/2011 Echo: EF 60-65%, Gr 2 DD;  d. 10/2011 DCCV  . Psoriasis   . Dyspepsia   . Deafness   . Kidney stones   . Hyperlipidemia   . Macular degeneration     pt reports that he actually has a "retina problem' not MD  . Atypical chest pain     a. 06/2003 Cath:  nl cors;  b. 11/2006 MV: no ischemia, nl LV  . Cataract   . COPD (chronic obstructive pulmonary disease) ([place])   . Diverticulosis of colon (without mention of hemorrhage) 1999, 2006  . Hiatal hernia 1999  . Esophagitis 1999  . Adenomatous polyp of colon 2006  . Arthritis   . Skin cancer     on leg, left arm  . Hypertension         Review of Systems Patient denies significant dyspnea,cough, hemoptysis,  chest pain, palpitations, pedal edema, orthopnea, paroxysmal nocturnal dyspnea, lightheadedness, nausea, vomiting, abdominal or  leg pains      Objective:   Physical Exam  Gen. Pleasant, obese, in no  distress ENT - no lesions, no post nasal drip Neck: No JVD, no thyromegaly, no carotid bruits Lungs: no use of accessory muscles, no dullness to percussion, decreased with fine  Rales rt base  Cardiovascular: Rhythm regular, heart sounds  normal, no murmurs or gallops, no peripheral edema Musculoskeletal: No deformities, no cyanosis or clubbing , no tremors       Assessment & Plan:

## 2015-07-18 NOTE — Patient Instructions (Signed)
Take 5 mg prednisone daily  Call if worse - Plan - increase pred to 20 mg for worse sob or wheezing Calcium supplements daily  If yellow/green phlegm, call for an antibiotic

## 2015-07-18 NOTE — Assessment & Plan Note (Signed)
Take 5 mg prednisone daily  Call if worse - Plan - increase pred to 20 mg for worse sob or wheezing Calcium supplements daily - discussed other side effects  If yellow/green phlegm, call for an antibiotic  Plan to keep him on low dose & attempt taper again in 3-6 m

## 2015-07-18 NOTE — Assessment & Plan Note (Signed)
Ct zyrtec

## 2015-08-18 ENCOUNTER — Other Ambulatory Visit (HOSPITAL_COMMUNITY): Payer: Self-pay | Admitting: Nurse Practitioner

## 2015-08-23 ENCOUNTER — Ambulatory Visit: Payer: Medicare Other | Admitting: Pulmonary Disease

## 2015-10-11 ENCOUNTER — Other Ambulatory Visit (HOSPITAL_COMMUNITY): Payer: Self-pay | Admitting: Nurse Practitioner

## 2015-10-17 ENCOUNTER — Ambulatory Visit (INDEPENDENT_AMBULATORY_CARE_PROVIDER_SITE_OTHER): Payer: Medicare Other | Admitting: Adult Health

## 2015-10-17 ENCOUNTER — Encounter: Payer: Self-pay | Admitting: Adult Health

## 2015-10-17 VITALS — BP 148/72 | HR 85 | Temp 97.8°F | Ht 75.0 in | Wt 267.0 lb

## 2015-10-17 DIAGNOSIS — J849 Interstitial pulmonary disease, unspecified: Secondary | ICD-10-CM

## 2015-10-17 MED ORDER — PREDNISONE 5 MG PO TABS: 5.0000 mg | ORAL_TABLET | Freq: Every day | ORAL | Status: DC

## 2015-10-17 NOTE — Patient Instructions (Addendum)
Check on dates for Pneumovax and Prevnar 13 vaccine .  Follow up with Dr. Elsworth Soho in 4 months and as needed

## 2015-10-17 NOTE — Progress Notes (Signed)
Subjective:    Patient ID: Kenneth [name], male    DOB: 1945/04/09, 70 y.o.   MRN: [id number]  HPI 22 -year-old remote smoker (cigars)  with psoriasis  With  ILD He presented with  dyspnea and repeated episodes of Chest congestion.  He has atrial fibrillation for 15 years s/p atrial ablation 01/2014 . He takes flecainide if his heart rate gets too high.  He was on Enbrel and methotrexate weekly for 3 years -now switched to Humira.  He worked for a [employer] and is now retired, he works on Insurance underwriter with a Advice worker He smokes cigars quit about 20 years ago  He reports a diagnosis of obstructive sleep apnea at the [place] years ago, but is not interested in pursuing this further  Significant tests/ events  CT angiogram 09/2007 which shows bilateral groundglass opacities but a repeat CT in 10/2007 and shows resolution of these infiltrates. Myocardial perfusion imaging on 11/2011 was normal.  01/27/2014 HRCT - patchy areas of very mild ground-glass attenuation and subpleural reticulation. Could not perform PFTs, Desatn to 94% on walking Spirometry 06/21/14 -moderate restriction FVC 50%, ratio normal CT chest HRCT 12/28/13  Some patchy areas of very mild ground-glass attenuation and subpleural reticulation CT CHest HRCT 01/2015 no sing change in ILD   10/17/2015 Follow up   : ILD with underlying Psorasis  Pt returns for a 3 month follow up. Pt states breathing is doing well and has no new complaints at this time. Pt taking 5mg  pred at this time. PVX and Prevnar -says they are utd. But unsure of dates -[facility]  Feels the best in while.  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  On Humira for psorasis .  Makes specialty pens.  Remains active , more than usual. Has done a lot of yard work . Very pleased at what he has been able to do.  No chest pain, orthopnea, edema or fever.    Past Medical History  Diagnosis Date  . Paroxysmal atrial fibrillation ([place])      a. 11/2003 Tikosyn initiated - subsequently d/c'd;  b. 2006 RFCA for Afib @ [facility];  c. 09/2011 Echo: EF 60-65%, Gr 2 DD;  d. 10/2011 DCCV  . Psoriasis   . Dyspepsia   . Deafness   . Kidney stones   . Hyperlipidemia   . Macular degeneration     pt reports that he actually has a "retina problem' not MD  . Atypical chest pain     a. 06/2003 Cath:  nl cors;  b. 11/2006 MV: no ischemia, nl LV  . Cataract   . COPD (chronic obstructive pulmonary disease) ([place])   . Diverticulosis of colon (without mention of hemorrhage) 1999, 2006  . Hiatal hernia 1999  . Esophagitis 1999  . Adenomatous polyp of colon 2006  . Arthritis   . Skin cancer     on leg, left arm  . Hypertension    Current Outpatient Prescriptions on File Prior to Visit  Medication Sig Dispense Refill  . Adalimumab 40 MG/0.8ML PNKT Inject 40 mg into the skin every 14 (fourteen) days. Fridays    . albuterol (PROVENTIL HFA;VENTOLIN HFA) 108 (90 BASE) MCG/ACT inhaler Inhale 1 puff into the lungs every 6 (six) hours as needed for wheezing or shortness of breath. 1 Inhaler 3  . CARTIA XT 120 MG 24 hr capsule TAKE ONE CAPSULE BY MOUTH ONCE DAILY 30 capsule 0  .  cetirizine (ZYRTEC) 10 MG tablet Take 10 mg by mouth daily.    . flecainide (TAMBOCOR) 100 MG tablet Take 300 mg by mouth as needed. Take 300mg  if heart rate reaches 140 beats per minute and monitor heart rate for 3 hours, if not returned to normal-go to [facility]    . Magnesium 400 MG CAPS Take 400 mg by mouth daily.     Marland Kitchen omeprazole (PRILOSEC) 20 MG capsule Take 20 mg by mouth 2 (two) times daily before a meal.     . predniSONE (DELTASONE) 5 MG tablet Take 5 mg by mouth See admin instructions. On Mondays and Fridays    . predniSONE (DELTASONE) 5 MG tablet Take 1 tablet (5 mg total) by mouth daily with breakfast. 60 tablet 1  . Probiotic Product (ALIGN) 4 MG CAPS Take 4 mg by mouth daily.    Marland Kitchen zolpidem (AMBIEN) 10 MG tablet Take 10 mg by mouth at bedtime as needed for sleep.       Current Facility-Administered Medications on File Prior to Visit  Medication Dose Route Frequency Provider Last Rate Last Dose  . lidocaine (cardiac) 100 mg/68ml (XYLOCAINE) 20 MG/ML injection 2%    Anesthesia Intra-op Rokoshi T Flowers, CRNA   60 mg at 03/25/13 1617  . propofol (DIPRIVAN) 10 mg/mL bolus/IV push    Anesthesia Intra-op Durwin Glaze Flowers, CRNA   90 mg at 03/25/13 1617    Review of Systems neg for any significant sore throat, dysphagia, itching, sneezing, nasal congestion or excess/ purulent secretions, fever, chills, sweats, unintended wt loss, pleuritic or exertional cp, hempoptysis, orthopnea pnd or change in chronic leg swelling. Also denies presyncope, palpitations, heartburn, abdominal pain, nausea, vomiting, diarrhea or change in bowel or urinary habits, dysuria,hematuria, rash, arthralgias, visual complaints, headache, numbness weakness or ataxia.     Objective:   Physical Exam Filed Vitals:   10/17/15 1138  BP: 148/72  Pulse: 85  Temp: 97.8 F (36.6 C)  TempSrc: Oral  Height: 6\' 3"  (1.905 m)  Weight: 267 lb (121.11 kg)  SpO2: 96%   Gen. Pleasant, obese, in no distress, normal affect ENT - no lesions, no post nasal drip, class 2-3 airway Neck: No JVD, no thyromegaly, no carotid bruits Lungs: no use of accessory muscles, no dullness to percussion, BB crackles  Cardiovascular: Rhythm regular, heart sounds  normal, no murmurs or gallops, no peripheral edema Abdomen: soft and non-tender, no hepatosplenomegaly, BS normal. Musculoskeletal: No deformities, no cyanosis or clubbing Neuro:  alert, non focal, no tremors  06/30/15  No CT evidence of central pulmonary artery embolism or thoracic aortic dissection.  Diffuse ground-glass density with areas of air trapping. No focal consolidation.   Jerri Glauser NP-C  [facility]  10/17/2015      Assessment & Plan:

## 2015-10-17 NOTE — Assessment & Plan Note (Signed)
Doing very well. No recent flare  Keep on pred 5mg  daily (along with Humira )   Plan  Check on dates for Pneumovax and Prevnar 13 vaccine .  Follow up with Dr. Elsworth Soho in 4 months and as needed

## 2015-10-17 NOTE — Addendum Note (Signed)
Addended by: Osa Craver on: 10/17/2015 12:38 PM   Modules accepted: Orders

## 2015-11-15 ENCOUNTER — Other Ambulatory Visit (HOSPITAL_COMMUNITY): Payer: Self-pay | Admitting: Nurse Practitioner

## 2015-11-15 DIAGNOSIS — S9002XA Contusion of left ankle, initial encounter: Secondary | ICD-10-CM | POA: Diagnosis not present

## 2015-11-15 DIAGNOSIS — Z6832 Body mass index (BMI) 32.0-32.9, adult: Secondary | ICD-10-CM | POA: Diagnosis not present

## 2015-12-16 ENCOUNTER — Other Ambulatory Visit (HOSPITAL_COMMUNITY): Payer: Self-pay | Admitting: Nurse Practitioner

## 2015-12-18 ENCOUNTER — Other Ambulatory Visit (HOSPITAL_COMMUNITY): Payer: Self-pay | Admitting: *Deleted

## 2015-12-18 DIAGNOSIS — Z23 Encounter for immunization: Secondary | ICD-10-CM | POA: Diagnosis not present

## 2015-12-18 MED ORDER — DILTIAZEM HCL ER COATED BEADS 120 MG PO CP24: 120.0000 mg | ORAL_CAPSULE | Freq: Every day | ORAL | 0 refills | Status: DC

## 2015-12-20 ENCOUNTER — Ambulatory Visit (HOSPITAL_COMMUNITY)
Admission: RE | Admit: 2015-12-20 | Discharge: 2015-12-20 | Disposition: A | Discharge status: Home / Self Care | Payer: Medicare Other | Source: Ambulatory Visit | Attending: Nurse Practitioner | Admitting: Nurse Practitioner

## 2015-12-20 ENCOUNTER — Encounter (HOSPITAL_COMMUNITY): Payer: Self-pay | Admitting: Nurse Practitioner

## 2015-12-20 VITALS — BP 160/78 | HR 90 | Ht 75.0 in | Wt 270.6 lb

## 2015-12-20 DIAGNOSIS — I480 Paroxysmal atrial fibrillation: Secondary | ICD-10-CM | POA: Diagnosis not present

## 2015-12-20 DIAGNOSIS — I4891 Unspecified atrial fibrillation: Secondary | ICD-10-CM | POA: Diagnosis present

## 2015-12-20 MED ORDER — DILTIAZEM HCL ER COATED BEADS 120 MG PO CP24: 120.0000 mg | ORAL_CAPSULE | Freq: Every day | ORAL | 9 refills | Status: DC

## 2015-12-20 MED ORDER — FLECAINIDE ACETATE 100 MG PO TABS: 300.0000 mg | ORAL_TABLET | ORAL | 1 refills | Status: DC | PRN

## 2015-12-20 NOTE — Progress Notes (Signed)
Patient ID: Kenneth [name], male   DOB: Aug 13, 1945, 70 y.o.   MRN: [id number]       PCP: Garnett Farm, MD  Pulmonologist: Dr. Autumn Patty is a 70 y.o. male who presents today in [facility] s/p afib/flutter ablation 10/15. He reports doing well from a heart standpoint without any awareness of sustatined irregular heartbeat. He is off daily antiarrythmic and blood thinner. Has prn flecainide to use. He does have interstitial lung disease and is on daily prednisone.  Had a flair recently and had to increase prednisone to 10 mg daily. Unfortunately, he has not been able to reduce back to 5 mg without being symptomatic. He is having close f/u with Dr. Elsworth Soho. 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    Today, he denies symptoms of palpitations, chest pain,  lower extremity edema, slightly dizziness today, no presyncope, or syncope.  The patient is otherwise without complaint today as for above.  Past Medical History:  Diagnosis Date  . Adenomatous polyp of colon 2006  . Arthritis   . Atypical chest pain    a. 06/2003 Cath:  nl cors;  b. 11/2006 MV: no ischemia, nl LV  . Cataract   . COPD (chronic obstructive pulmonary disease) ([place])   . Deafness   . Diverticulosis of colon (without mention of hemorrhage) 1999, 2006  . Dyspepsia   . Esophagitis 1999  . Hiatal hernia 1999  . Hyperlipidemia   . Hypertension   . Kidney stones   . Macular degeneration    pt reports that he actually has a "retina problem' not MD  . Paroxysmal atrial fibrillation ([place])    a. 11/2003 Tikosyn initiated - subsequently d/c'd;  b. 2006 RFCA for Afib @ [facility];  c. 09/2011 Echo: EF 60-65%, Gr 2 DD;  d. 10/2011 DCCV  . Psoriasis   . Skin cancer    on leg, left arm   Past Surgical History:  Procedure Laterality Date  . APPENDECTOMY  1996  . atrial fibrillation ablation  2006   Dr Rolland Porter at [facility]  . ATRIAL FIBRILLATION ABLATION N/A 01/13/2014   repeat PVI, also left atrial ablation performed with  successfull ablation of LA flutter by Dr Rayann Heman  . CARDIOVERSION N/A 05/22/2013   Procedure: CARDIOVERSION;  Surgeon: [name], MD;  Location: [facility];  Service: Cardiovascular;  Laterality: N/A;  . CARDIOVERSION N/A 10/23/2011   Procedure: CARDIOVERSION;  Surgeon: Deboraha Sprang, MD;  Location: [facility] CATH LAB;  Service: Cardiovascular;  Laterality: N/A;  . CARDIOVERSION N/A 05/01/2012   Procedure: CARDIOVERSION;  Surgeon: Deboraha Sprang, MD;  Location: [facility] CATH LAB;  Service: Cardiovascular;  Laterality: N/A;  . EYE SURGERY Bilateral    cataracts with lens implants  . NASAL SINUS SURGERY     x 2  . ROTATOR CUFF REPAIR    . TONSILLECTOMY      ROS- all systems are reviewed and negatives except as per HPI above  Current Outpatient Prescriptions  Medication Sig Dispense Refill  . Adalimumab 40 MG/0.8ML PNKT Inject 40 mg into the skin every 14 (fourteen) days. Fridays    . cetirizine (ZYRTEC) 10 MG tablet Take 10 mg by mouth daily.    Marland Kitchen diltiazem (CARTIA XT) 120 MG 24 hr capsule Take 1 capsule (120 mg total) by mouth daily. 30 capsule 9  . flecainide (TAMBOCOR) 100 MG tablet Take 3 tablets (300 mg total) by mouth as needed. Take 300mg  if heart rate reaches 140 beats  per minute and monitor heart rate for 3 hours, if not returned to normal-go to [facility] ER 6 tablet 1  . Magnesium 400 MG CAPS Take 400 mg by mouth daily.     Marland Kitchen omeprazole (PRILOSEC) 20 MG capsule Take 20 mg by mouth 2 (two) times daily before a meal.     . predniSONE (DELTASONE) 10 MG tablet Take 10 mg by mouth daily with breakfast.    . Probiotic Product (ALIGN) 4 MG CAPS Take 4 mg by mouth daily.     No current facility-administered medications for this encounter.    Facility-Administered Medications Ordered in Other Encounters  Medication Dose Route Frequency Provider Last Rate Last Dose  . lidocaine (cardiac) 100 mg/54ml (XYLOCAINE) 20 MG/ML injection 2%    Anesthesia Intra-op Rokoshi T Flowers, CRNA   60 mg at 03/25/13 1617    . propofol (DIPRIVAN) 10 mg/mL bolus/IV push    Anesthesia Intra-op Rokoshi T Flowers, CRNA   90 mg at 03/25/13 1617    Physical Exam: Vitals:   12/20/15 0846  BP: (!) 160/78  Pulse: 90  Weight: 270 lb 9.6 oz (122.7 kg)  Height: 6\' 3"  (1.905 m)  BP rechecked at 148/80  GEN- The patient is well appearing, alert and oriented x 3 today.   Head- normocephalic, atraumatic Eyes-  Sclera clear, conjunctiva pink Ears- hearing intact Oropharynx- clear Lungs-   Normal work of breathing Heart- Regular rate and rhythm, no murmurs, rubs or gallops, PMI not laterally displaced. GI- soft, NT, ND, + BS Extremities- no clubbing, cyanosis, or edema  EKG- NSR at 81 bpm, pr int 174 ms, qrs int 86 ms, qtc 427 ms Epic records reviewed   Assessment and Plan:  1. Afib/ atypical flutter Doing well s/p ablation wtihout recurrence Continue cardizem He has prn flecainde that he can take. chads2vasc score is 1. Off xarelto per pt wishes.  Per guidelines, this is a reasonable option.  2. ILD Continue prednisone per pulmonologist  3. HTN Initially elevated, rechecked and improved at 148/80   F/u in  afib clinic in 9 months   Butch Penny C. Amador Braddy, [facility] [address] [place], [place] [address] [phone]

## 2015-12-21 ENCOUNTER — Telehealth: Payer: Self-pay | Admitting: Gastroenterology

## 2015-12-21 NOTE — Telephone Encounter (Signed)
Left message for patient to call back  

## 2015-12-22 NOTE — Telephone Encounter (Signed)
Patient reports he was told by his primary care at the [facility] that he is anemic and needs a colonoscopy. He is advised that we will need the records from the [place] and he will need an office visit to evaluate.  He is scheduled for 01/24/16.  He will bring the labs with him to the appt

## 2016-01-15 ENCOUNTER — Other Ambulatory Visit: Payer: Self-pay | Admitting: Pulmonary Disease

## 2016-01-24 ENCOUNTER — Encounter: Payer: Self-pay | Admitting: Gastroenterology

## 2016-01-24 ENCOUNTER — Ambulatory Visit (INDEPENDENT_AMBULATORY_CARE_PROVIDER_SITE_OTHER): Payer: Medicare Other | Admitting: Gastroenterology

## 2016-01-24 VITALS — BP 138/78 | HR 84 | Ht 75.0 in | Wt 267.2 lb

## 2016-01-24 DIAGNOSIS — Z86010 Personal history of colonic polyps: Secondary | ICD-10-CM | POA: Diagnosis not present

## 2016-01-24 DIAGNOSIS — D509 Iron deficiency anemia, unspecified: Secondary | ICD-10-CM | POA: Diagnosis not present

## 2016-01-24 NOTE — Progress Notes (Signed)
    History of Present Illness: This is a 70 year old male here for further evaluation of iron deficiency anemia. He is accompanied by his wife. He had a physical exam and blood work performed by his PCP at the [facility] outpatient facility. Hemoglobin=12.8, iron saturation=13.2%, iron=59, TIBC=448. Ferritin low normal at 43. He underwent colonoscopy as below. He has no gastrointestinal complaints. Denies weight loss, abdominal pain, constipation, diarrhea, change in stool caliber, melena, hematochezia, nausea, vomiting, dysphagia, reflux symptoms, chest pain.   Colonoscopy 09/2013 2 small polyps, diverticulosis   Current Medications, Allergies, Past Medical History, Past Surgical History, Family History and Social History were reviewed in [employer] record.  Physical Exam: General: Well developed, well nourished, no acute distress Head: Normocephalic and atraumatic Eyes:  sclerae anicteric, EOMI Ears: Normal auditory acuity Mouth: No deformity or lesions Lungs: Clear throughout to auscultation Heart: Regular rate and rhythm; no murmurs, rubs or bruits Abdomen: Soft, non tender and non distended. No masses, hepatosplenomegaly or hernias noted. Normal Bowel sounds Musculoskeletal: Symmetrical with no gross deformities  Pulses:  Normal pulses noted Extremities: No clubbing, cyanosis, edema or deformities noted Neurological: Alert oriented x 4, grossly nonfocal Psychological:  Alert and cooperative. Normal mood and affect  Assessment and Recommendations:  1. Iron deficiency anemia. Rule out occult UGI tract losses and celiac disease. Colonoscopy does not need to be repeated at this time. Begin iron twice daily with meals. Schedule EGD. The risks (including bleeding, perforation, infection, missed lesions, medication reactions and possible hospitalization or surgery if complications occur), benefits, and alternatives to endoscopy with possible biopsy and possible  dilation were discussed with the patient and they consent to proceed.   2. Personal history of adenomatous colon polyps. Surveillance colonoscopy is due in 09/2018.   3. Interstitial lung disease maintained on low-dose prednisone.  4. Psoriasis maintained on adalimumab.  5. History of atrial fibrillation status post ablation.

## 2016-01-24 NOTE — Patient Instructions (Signed)
You need to start iron twice daily with meals.   You have been scheduled for an endoscopy. Please follow written instructions given to you at your visit today. If you use inhalers (even only as needed), please bring them with you on the day of your procedure.  Normal BMI (Body Mass Index- based on height and weight) is between 23 and 30. Your BMI today is Body mass index is 33.4 kg/m. Marland Kitchen Please consider follow up  regarding your BMI with your Primary Care Provider.  Thank you for choosing me and [facility].  Pricilla Riffle. Dagoberto Ligas., MD., [facility] Regal

## 2016-02-06 DIAGNOSIS — H5203 Hypermetropia, bilateral: Secondary | ICD-10-CM | POA: Diagnosis not present

## 2016-02-06 DIAGNOSIS — H524 Presbyopia: Secondary | ICD-10-CM | POA: Diagnosis not present

## 2016-02-06 DIAGNOSIS — H52203 Unspecified astigmatism, bilateral: Secondary | ICD-10-CM | POA: Diagnosis not present

## 2016-02-06 DIAGNOSIS — H16223 Keratoconjunctivitis sicca, not specified as Sjogren's, bilateral: Secondary | ICD-10-CM | POA: Diagnosis not present

## 2016-02-06 DIAGNOSIS — H35373 Puckering of macula, bilateral: Secondary | ICD-10-CM | POA: Diagnosis not present

## 2016-02-19 ENCOUNTER — Ambulatory Visit (INDEPENDENT_AMBULATORY_CARE_PROVIDER_SITE_OTHER): Payer: Medicare Other | Admitting: Pulmonary Disease

## 2016-02-19 ENCOUNTER — Encounter: Payer: Self-pay | Admitting: Pulmonary Disease

## 2016-02-19 DIAGNOSIS — J849 Interstitial pulmonary disease, unspecified: Secondary | ICD-10-CM | POA: Diagnosis not present

## 2016-02-19 NOTE — Progress Notes (Signed)
   Subjective:    Patient ID: Kenneth [name], male    DOB: February 09, 1946, 70 y.o.   MRN: [id number]  HPI  70 year old remote smoker (cigars)  with psoriasis  for FU of Steroid responsive ILD, favor NSIP   He has atrial fibrillation for 15 years s/p atrial ablation 01/2014 . He takes flecainide if his heart rate gets too high.  He was on Enbrel and methotrexate weekly for 3 years -switched to Humira.  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  - on 10mg  pred since 07/2014   02/19/2016  Chief Complaint  Patient presents with  . Follow-up    Pt. states his breathing is not doing too well, Pt. c/o still feeling SOB, some coughing, has some trouble catching his breath, Noticed its taken a long time to get hi breathing back at rest   He feels that his breathing is slowly getting worse He feels himself huffing and puffing after walking short distances He did increase his prednisone to 10 mg over the summer and has stuck with this does  He had an ER visit in 06/2015 when CT chest showed some groundglass opacities-this was likely related to a URI that he contracted from his wife and has not recurred again  We had discussed a plan should his breathing get worse of increasing his prednisone-since he travels around a whole lot  Significant tests/ events  CT angiogram 09/2007 which shows bilateral groundglass opacities but a repeat CT in 10/2007 and shows resolution of these infiltrates. Myocardial perfusion imaging on 11/2011 was normal.  01/27/2014 HRCT - patchy areas of very mild ground-glass attenuation and subpleural reticulation.  CT chest HRCT 12/28/13  Some patchy areas of very mild ground-glass attenuation and  subpleural reticulation CT CHest HRCT 01/2015 no sig change in ILD   Ct angio 06/2015 GGO  PFT 04/2015 >> ratio nml, FVC 61%, TLC 68%, DLCO 67%     Review of Systems neg for any significant sore throat, dysphagia, itching, sneezing, nasal congestion or excess/ purulent secretions, fever, chills, sweats, unintended wt loss, pleuritic or exertional cp, hempoptysis, orthopnea pnd or change in chronic leg swelling. Also denies presyncope, palpitations, heartburn, abdominal pain, nausea, vomiting, diarrhea or change in bowel or urinary habits, dysuria,hematuria, rash, arthralgias, visual complaints, headache, numbness weakness or ataxia.     Objective:   Physical Exam   Gen. Pleasant, obese, in no distress ENT - no lesions, no post nasal drip Neck: No JVD, no thyromegaly, no carotid bruits Lungs: no use of accessory muscles, no dullness to percussion, decreased without rales or rhonchi  Cardiovascular: Rhythm regular, heart sounds  normal, no murmurs or gallops, no peripheral edema Musculoskeletal: No deformities, no cyanosis or clubbing , no tremors        Assessment & Plan:

## 2016-02-19 NOTE — Patient Instructions (Signed)
Schedule for PFTs Schedule high-resolution CT chest in January

## 2016-02-19 NOTE — Assessment & Plan Note (Signed)
He seems to be subjectively worse We will try to objectively confirmed with the repeating a high resolution CT chest and PFTs If he does seem worse, then we may have to put him on steroid sparing agent such as Imuran He is already on Humira shots for psoriasis and is concerned about immune suppressants quite appropriately.  He will stay on 10 mg of prednisone for now-we discussed our plan to increase to 20 mg should his breathing get worse in winter. He will also stay on calcium supplements

## 2016-02-19 NOTE — Addendum Note (Signed)
Addended by: Rigoberto Noel on: 02/19/2016 09:28 AM   Modules accepted: Orders

## 2016-02-26 ENCOUNTER — Encounter: Payer: Self-pay | Admitting: Gastroenterology

## 2016-02-26 ENCOUNTER — Ambulatory Visit (AMBULATORY_SURGERY_CENTER): Payer: Medicare Other | Admitting: Gastroenterology

## 2016-02-26 VITALS — BP 152/83 | HR 80 | Temp 98.6°F | Resp 25 | Ht 75.0 in | Wt 267.0 lb

## 2016-02-26 DIAGNOSIS — D509 Iron deficiency anemia, unspecified: Secondary | ICD-10-CM | POA: Diagnosis not present

## 2016-02-26 DIAGNOSIS — G4733 Obstructive sleep apnea (adult) (pediatric): Secondary | ICD-10-CM | POA: Diagnosis not present

## 2016-02-26 DIAGNOSIS — K317 Polyp of stomach and duodenum: Secondary | ICD-10-CM

## 2016-02-26 DIAGNOSIS — I4891 Unspecified atrial fibrillation: Secondary | ICD-10-CM | POA: Diagnosis not present

## 2016-02-26 DIAGNOSIS — K219 Gastro-esophageal reflux disease without esophagitis: Secondary | ICD-10-CM | POA: Diagnosis not present

## 2016-02-26 DIAGNOSIS — I509 Heart failure, unspecified: Secondary | ICD-10-CM | POA: Diagnosis not present

## 2016-02-26 DIAGNOSIS — I4892 Unspecified atrial flutter: Secondary | ICD-10-CM | POA: Diagnosis not present

## 2016-02-26 LAB — GLUCOSE, CAPILLARY
GLUCOSE-CAPILLARY: 136 mg/dL — AB (ref 65–99)
GLUCOSE-CAPILLARY: 112 mg/dL — AB (ref 65–99)

## 2016-02-26 MED ORDER — SODIUM CHLORIDE 0.9 % IV SOLN: 500.0000 mL | INTRAVENOUS | Status: DC

## 2016-02-26 NOTE — Patient Instructions (Signed)
Impression/Recommendations:  YOU HAD AN ENDOSCOPIC PROCEDURE TODAY AT [facility]:   Refer to the procedure report that was given to you for any specific questions about what was found during the examination.  If the procedure report does not answer your questions, please call your gastroenterologist to clarify.  If you requested that your care partner not be given the details of your procedure findings, then the procedure report has been included in a sealed envelope for you to review at your convenience later.  YOU SHOULD EXPECT: Some feelings of bloating in the abdomen. Passage of more gas than usual.  Walking can help get rid of the air that was put into your GI tract during the procedure and reduce the bloating. If you had a lower endoscopy (such as a colonoscopy or flexible sigmoidoscopy) you may notice spotting of blood in your stool or on the toilet paper. If you underwent a bowel prep for your procedure, you may not have a normal bowel movement for a few days.  Please Note:  You might notice some irritation and congestion in your nose or some drainage.  This is from the oxygen used during your procedure.  There is no need for concern and it should clear up in a day or so.  SYMPTOMS TO REPORT IMMEDIATELY:   Following upper endoscopy (EGD)  Vomiting of blood or coffee ground material  New chest pain or pain under the shoulder blades  Painful or persistently difficult swallowing  New shortness of breath  Fever of 100F or higher  Black, tarry-looking stools  For urgent or emergent issues, a gastroenterologist can be reached at any hour by calling [phone].   DIET:  We do recommend a small meal at first, but then you may proceed to your regular diet.  Drink plenty of fluids but you should avoid alcoholic beverages for 24 hours.  ACTIVITY:  You should plan to take it easy for the rest of today and you should NOT DRIVE or use heavy machinery until tomorrow (because  of the sedation medicines used during the test).    FOLLOW UP: Our staff will call the number listed on your records the next business day following your procedure to check on you and address any questions or concerns that you may have regarding the information given to you following your procedure. If we do not reach you, we will leave a message.  However, if you are feeling well and you are not experiencing any problems, there is no need to return our call.  We will assume that you have returned to your regular daily activities without incident.  If any biopsies were taken you will be contacted by phone or by letter within the next 1-3 weeks.  Please call us at [phone] if you have not heard about the biopsies in 3 weeks.    SIGNATURES/CONFIDENTIALITY: You and/or your care partner have signed paperwork which will be entered into your electronic medical record.  These signatures attest to the fact that that the information above on your After Visit Summary has been reviewed and is understood.  Full responsibility of the confidentiality of this discharge information lies with you and/or your care-partner.

## 2016-02-26 NOTE — Op Note (Signed)
[facility] Patient Name: Kenneth [name] Procedure Date: 02/26/2016 10:02 AM MRN: [id number] Endoscopist: Ladene Artist , MD Age: 70 Referring MD:  Date of Birth: 1946/02/03 Gender: Male Account #: [id number] Procedure:                Upper GI endoscopy Indications:              Iron deficiency anemia Medicines:                Monitored Anesthesia Care Procedure:                Pre-Anesthesia Assessment:                           - Prior to the procedure, a History and Physical                            was performed, and patient medications and                            allergies were reviewed. The patient's tolerance of                            previous anesthesia was also reviewed. The risks                            and benefits of the procedure and the sedation                            options and risks were discussed with the patient.                            All questions were answered, and informed consent                            was obtained. Prior Anticoagulants: The patient has                            taken no previous anticoagulant or antiplatelet                            agents. ASA Grade Assessment: III - A patient with                            severe systemic disease. After reviewing the risks                            and benefits, the patient was deemed in                            satisfactory condition to undergo the procedure.                           After obtaining informed consent, the endoscope was  passed under direct vision. Throughout the                            procedure, the patient's blood pressure, pulse, and                            oxygen saturations were monitored continuously. The                            Model GIF-HQ190 [id number]) scope was introduced                            through the mouth, and advanced to the second part                            of duodenum. The upper GI  endoscopy was                            accomplished without difficulty. The patient                            tolerated the procedure well. Scope In: Scope Out: Findings:                 The examined esophagus was normal.                           Multiple 3 to 5 mm sessile polyps with no bleeding                            and no stigmata of recent bleeding were found in                            the gastric fundus and in the gastric body.                            Biopsies were taken with a cold forceps for                            histology.                           The exam of the stomach was otherwise normal.                           The duodenal bulb and second portion of the                            duodenum were normal. Biopsies were taken with a                            cold forceps for histology. Complications:            No immediate complications. Estimated Blood Loss:     Estimated blood loss was minimal. Impression:               -  Normal esophagus.                           - Multiple gastric polyps. Biopsied.                           - Normal duodenal bulb and second portion of the                            duodenum. Biopsied. Recommendation:           - Patient has a contact number available for                            emergencies. The signs and symptoms of potential                            delayed complications were discussed with the                            patient. Return to normal activities tomorrow.                            Written discharge instructions were provided to the                            patient.                           - Resume previous diet.                           - Continue present medications.                           - Await pathology results.                           - Consider capsule endoscopy if celiac disease is                            excluded and Fe deficiency does not easily correct. Ladene Artist,  MD 02/26/2016 10:16:33 AM This report has been signed electronically.

## 2016-02-26 NOTE — Progress Notes (Signed)
Report to PACU, RN, vss, BBS= Clear.  

## 2016-02-26 NOTE — Progress Notes (Signed)
Called to room to assist during endoscopic procedure.  Patient ID and intended procedure confirmed with present staff. Received instructions for my participation in the procedure from the performing physician.  

## 2016-02-27 ENCOUNTER — Telehealth: Payer: Self-pay

## 2016-02-27 NOTE — Telephone Encounter (Signed)
  Follow up Call-  Call back number 02/26/2016 09/07/2013  Post procedure Call Back phone  # [phone] wife- [phone]  Permission to leave phone message Yes Yes  Some recent data might be hidden    Patient was called for follow up after his procedure on 02/26/2016. No answer at the number given for follow up phone call. A message was left on the answering machine.

## 2016-02-27 NOTE — Telephone Encounter (Signed)
  Follow up Call-  Call back number 02/26/2016 09/07/2013  Post procedure Call Back phone  # [phone] wife- [phone]  Permission to leave phone message Yes Yes  Some recent data might be hidden    Patient was called for follow up after his procedure on 02/26/2016. No answer at the number given for follow up phone call. A message was left on the answering machine.

## 2016-03-07 ENCOUNTER — Encounter: Payer: Self-pay | Admitting: Gastroenterology

## 2016-03-18 ENCOUNTER — Telehealth: Payer: Self-pay | Admitting: Pulmonary Disease

## 2016-03-18 MED ORDER — DOXYCYCLINE HYCLATE 100 MG PO TABS: 100.0000 mg | ORAL_TABLET | Freq: Two times a day (BID) | ORAL | 0 refills | Status: DC

## 2016-03-18 NOTE — Telephone Encounter (Signed)
Called and spoke to pt. Pt c/o prod cough with yellow mucus, increase in SOB, wheezing, head congestion, fatigue x 2 days. Pt denies f/c/s and CP/tightness. Pt states he is taking chlortrimeton and is taking pred 15mg  daily, without relief.  Will send to DOD as RA is unavailable. Pt is requesting abx.   Dr. Ashok Cordia please advise. Thanks.

## 2016-03-18 NOTE — Telephone Encounter (Signed)
I spoke with the pt and notified of recs per RA  He verbalized understanding  Nothing further needed  Rx was sent to pharm

## 2016-03-18 NOTE — Telephone Encounter (Signed)
Please send in a prescription for Doxycycline 100mg  po bid x 7 days. Instruct him to take with a full glass of water and remain upright for 1 hour afterward. He should not take it with any dairy products either. If he is not improving after 48-72 hours he should call back.

## 2016-04-22 ENCOUNTER — Ambulatory Visit (INDEPENDENT_AMBULATORY_CARE_PROVIDER_SITE_OTHER)
Admission: RE | Admit: 2016-04-22 | Discharge: 2016-04-22 | Disposition: A | Discharge status: Home / Self Care | Payer: Medicare Other | Source: Ambulatory Visit | Attending: Pulmonary Disease | Admitting: Pulmonary Disease

## 2016-04-22 DIAGNOSIS — J849 Interstitial pulmonary disease, unspecified: Secondary | ICD-10-CM

## 2016-04-22 DIAGNOSIS — R0602 Shortness of breath: Secondary | ICD-10-CM | POA: Diagnosis not present

## 2016-04-24 ENCOUNTER — Ambulatory Visit: Payer: Medicare Other | Admitting: Pulmonary Disease

## 2016-04-26 ENCOUNTER — Other Ambulatory Visit: Payer: Self-pay | Admitting: Pulmonary Disease

## 2016-04-26 ENCOUNTER — Other Ambulatory Visit: Payer: Self-pay | Admitting: Cardiovascular Disease

## 2016-05-02 ENCOUNTER — Ambulatory Visit: Payer: Medicare Other | Admitting: Pulmonary Disease

## 2016-06-03 ENCOUNTER — Telehealth (HOSPITAL_COMMUNITY): Payer: Self-pay | Admitting: *Deleted

## 2016-06-03 NOTE — Telephone Encounter (Signed)
Patient called in stating he has been in afib since Saturday. HR is in the 90s -100s. Feeling fatigued and increased shortness of breath. Instructed pt to take extra cardizem cd now and will bring in for ekg tomorrow. Pt is currently off DOAC for chadvasc of 1. Pt agreeable to plan.

## 2016-06-04 ENCOUNTER — Ambulatory Visit (HOSPITAL_COMMUNITY)
Admission: RE | Admit: 2016-06-04 | Discharge: 2016-06-04 | Disposition: A | Discharge status: Home / Self Care | Payer: Medicare Other | Source: Ambulatory Visit | Attending: Nurse Practitioner | Admitting: Nurse Practitioner

## 2016-06-04 ENCOUNTER — Encounter (HOSPITAL_COMMUNITY): Payer: Self-pay | Admitting: Nurse Practitioner

## 2016-06-04 VITALS — BP 162/82 | HR 88 | Ht 75.0 in | Wt 270.0 lb

## 2016-06-04 DIAGNOSIS — Z87442 Personal history of urinary calculi: Secondary | ICD-10-CM | POA: Diagnosis not present

## 2016-06-04 DIAGNOSIS — I4892 Unspecified atrial flutter: Secondary | ICD-10-CM | POA: Diagnosis not present

## 2016-06-04 DIAGNOSIS — H3530 Unspecified macular degeneration: Secondary | ICD-10-CM | POA: Insufficient documentation

## 2016-06-04 DIAGNOSIS — E119 Type 2 diabetes mellitus without complications: Secondary | ICD-10-CM | POA: Insufficient documentation

## 2016-06-04 DIAGNOSIS — Z7952 Long term (current) use of systemic steroids: Secondary | ICD-10-CM | POA: Insufficient documentation

## 2016-06-04 DIAGNOSIS — J849 Interstitial pulmonary disease, unspecified: Secondary | ICD-10-CM | POA: Diagnosis not present

## 2016-06-04 DIAGNOSIS — R002 Palpitations: Secondary | ICD-10-CM | POA: Diagnosis not present

## 2016-06-04 DIAGNOSIS — I4891 Unspecified atrial fibrillation: Secondary | ICD-10-CM | POA: Diagnosis not present

## 2016-06-04 DIAGNOSIS — J449 Chronic obstructive pulmonary disease, unspecified: Secondary | ICD-10-CM | POA: Diagnosis not present

## 2016-06-04 DIAGNOSIS — Z85828 Personal history of other malignant neoplasm of skin: Secondary | ICD-10-CM | POA: Diagnosis not present

## 2016-06-04 DIAGNOSIS — R5383 Other fatigue: Secondary | ICD-10-CM | POA: Diagnosis not present

## 2016-06-04 DIAGNOSIS — I10 Essential (primary) hypertension: Secondary | ICD-10-CM | POA: Diagnosis not present

## 2016-06-04 DIAGNOSIS — K219 Gastro-esophageal reflux disease without esophagitis: Secondary | ICD-10-CM | POA: Insufficient documentation

## 2016-06-04 DIAGNOSIS — G4730 Sleep apnea, unspecified: Secondary | ICD-10-CM | POA: Insufficient documentation

## 2016-06-04 DIAGNOSIS — E785 Hyperlipidemia, unspecified: Secondary | ICD-10-CM | POA: Insufficient documentation

## 2016-06-04 DIAGNOSIS — H9190 Unspecified hearing loss, unspecified ear: Secondary | ICD-10-CM | POA: Insufficient documentation

## 2016-06-04 DIAGNOSIS — Z79899 Other long term (current) drug therapy: Secondary | ICD-10-CM | POA: Diagnosis not present

## 2016-06-04 NOTE — Progress Notes (Signed)
Patient ID: TRINE ABELLA, male   DOB: 29-Jul-1945, 71 y.o.   MRN: [id number]       PCP: Garnett Farm, MD  Pulmonologist: Dr. Autumn Patty is a 71 y.o. male who presents today in [facility] s/p afib/flutter ablation 10/15. He reports doing well from a heart standpoint without any awareness of sustatined irregular heartbeat. He is off daily antiarrythmic and blood thinner. Has prn flecainide to use. He does have interstitial lung disease and is on daily prednisone.  Had a flair recently and had to increase prednisone to 10 mg daily. Unfortunately, he has not been able to reduce back to 5 mg without being symptomatic. He is having close f/u with Dr. Elsworth Soho. 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   He asked to be seen today after feeling a few extra beats and thought he may be in a afib/flutter. He states that he has been very winded with his activities and has noted increased fatigue. 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 He was very winded when he got to clinic today but he got lost in the hospital and had to walk a distance. His EKG showed SR at 88 bpm. His description of irregular heart beat does sound like he is having PC's vrs afib or flutter. He has not had to use flecainide since his ablation, per Dr. Jackalyn Lombard note 11/2014, he was off anticoagulation anticoagulation and is not on anticoagulation with chadsvasc score of 2. He continues to see Dr. Elsworth Soho for ILD.   Today, he denies symptoms of palpitations, chest pain,  lower extremity edema, slightly dizziness today, no presyncope, or syncope.  The patient is otherwise without complaint today as for above.  Past Medical History:  Diagnosis Date  . Adenomatous polyp of colon 2006  . Allergy   . Anemia   . Arthritis   . Atypical chest pain    a. 06/2003 Cath:  nl cors;  b. 11/2006 MV: no ischemia, nl LV  . Cataract   . COPD (chronic obstructive  pulmonary disease) ([place])   . Deafness   . Diabetes mellitus without complication ([place])   . Diverticulosis of colon (without mention of hemorrhage) 1999, 2006  . Dyspepsia   . Esophagitis 1999  . GERD (gastroesophageal reflux disease)   . Heart murmur   . Hiatal hernia 1999  . Hyperlipidemia   . Hypertension   . Kidney stones   . Macular degeneration    pt reports that he actually has a "retina problem' not MD  . Paroxysmal atrial fibrillation ([place])    a. 11/2003 Tikosyn initiated - subsequently d/c'd;  b. 2006 RFCA for Afib @ [facility];  c. 09/2011 Echo: EF 60-65%, Gr 2 DD;  d. 10/2011 DCCV  . Psoriasis   . Psoriasis 1967  . Skin cancer    on leg, left arm  . Sleep apnea    patient has had the sleep test, but states they said it didn't need treatment   Past Surgical History:  Procedure Laterality Date  . APPENDECTOMY  1996  . atrial fibrillation ablation  2006   Dr Rolland Porter [facility]  . ATRIAL FIBRILLATION ABLATION N/A 01/13/2014   repeat PVI, also left atrial ablation performed with successfull ablation of LA flutter by Dr Rayann Heman  . CARDIOVERSION N/A 05/22/2013   Procedure: CARDIOVERSION;  Surgeon: [name], MD;  Location: [place] [facility];  Service:  Cardiovascular;  Laterality: N/A;  . CARDIOVERSION N/A 10/23/2011   Procedure: CARDIOVERSION;  Surgeon: Deboraha Sprang, MD;  Location: [facility] CATH LAB;  Service: Cardiovascular;  Laterality: N/A;  . CARDIOVERSION N/A 05/01/2012   Procedure: CARDIOVERSION;  Surgeon: Deboraha Sprang, MD;  Location: [facility] C[facility] LAB;  Service: Cardiovascular;  Laterality: N/A;  . COLONOSCOPY    . ESOPHAGUS SURGERY    . EYE SURGERY Bilateral    cataracts with lens implants  . KIDNEY STONE SURGERY  2010   Patient states he has had several kidney stones up until aroune 2010, none since  . NASAL SINUS SURGERY     x 2  . ROTATOR CUFF REPAIR    . TONSILLECTOMY      ROS- all systems are reviewed and negatives except as per HPI above  Current Outpatient Prescriptions    Medication Sig Dispense Refill  . Adalimumab 40 MG/0.8ML PNKT Inject 40 mg into the skin every 14 (fourteen) days. Fridays    . cetirizine (ZYRTEC) 10 MG tablet Take 10 mg by mouth daily.    Marland Kitchen diltiazem (CARTIA XT) 120 MG 24 hr capsule Take 1 capsule (120 mg total) by mouth daily. 30 capsule 9  . Magnesium 400 MG CAPS Take 400 mg by mouth daily.     . naproxen sodium (ANAPROX) 220 MG tablet Take by mouth.    Marland Kitchen omeprazole (PRILOSEC) 20 MG capsule Take 20 mg by mouth 2 (two) times daily before a meal.     . predniSONE (DELTASONE) 10 MG tablet Take 10 mg by mouth daily with breakfast.    . predniSONE (DELTASONE) 5 MG tablet TAKE ONE TABLET BY MOUTH ONCE DAILY WITH BREAKFAST 60 tablet 1  . Probiotic Product (ALIGN) 4 MG CAPS Take 4 mg by mouth daily.    . flecainide (TAMBOCOR) 100 MG tablet Take 3 tablets (300 mg total) by mouth as needed. Take 300mg  if heart rate reaches 140 beats per minute and monitor heart rate for 3 hours, if not returned to normal-go to [facility] ER (Patient not taking: Reported on 06/04/2016) 6 tablet 1  . meloxicam (MOBIC) 15 MG tablet TAKE ONE TABLET BY MOUTH ONCE DAILY     Current Facility-Administered Medications  Medication Dose Route Frequency Provider Last Rate Last Dose  . 0.9 %  sodium chloride infusion  500 mL Intravenous Continuous Ladene Artist, MD       Facility-Administered Medications Ordered in Other Encounters  Medication Dose Route Frequency Provider Last Rate Last Dose  . lidocaine (cardiac) 100 mg/37ml (XYLOCAINE) 20 MG/ML injection 2%    Anesthesia Intra-op Durwin Glaze Flowers, CRNA   60 mg at 03/25/13 1617  . propofol (DIPRIVAN) 10 mg/mL bolus/IV push    Anesthesia Intra-op Durwin Glaze Flowers, CRNA   90 mg at 03/25/13 1617    Physical Exam: Vitals:   06/04/16 1418  BP: (!) 162/82  Pulse: 88  Weight: 270 lb (122.5 kg)  Height: 6\' 3"  (1.905 m)  BP rechecked at 148/80  GEN- The patient is well appearing, alert and oriented x 3 today.   Head-  normocephalic, atraumatic Eyes-  Sclera clear, conjunctiva pink Ears- hearing intact Oropharynx- clear Lungs-   Normal work of breathing Heart- Regular rate and rhythm, no murmurs, rubs or gallops, PMI not laterally displaced. GI- soft, NT, ND, + BS Extremities- no clubbing, cyanosis, or edema  EKG- NSR at 88 bpm, pr int 162 ms, qrs int 86 ms, qtc 421 ms Epic records reviewed  Assessment and Plan:  1. Afib/ atypical flutter Doing well s/p ablation wtihout recurrence I feel he had PC's by his description over the last few days, he is in SR Continue cardizem chads2vasc score is 2. Off xarelto per pt wishes.   2. ILD Continue f/u with  Pulmonologist I feel his c/o of exertional dyspnea and fatigue may be from this issue  3. HTN Always elevated in the clinic, but pt states it is normal at home   F/u in  afib clinic as needed F/u with Dr. Elsworth Soho as scheduled 3/6   Geroge Baseman. Zavian Slowey, [facility] [address] [place],  [address] [phone]

## 2016-06-06 DIAGNOSIS — Z9861 Coronary angioplasty status: Secondary | ICD-10-CM

## 2016-06-06 DIAGNOSIS — I2510 Atherosclerotic heart disease of native coronary artery without angina pectoris: Secondary | ICD-10-CM

## 2016-06-06 HISTORY — DX: Atherosclerotic heart disease of native coronary artery without angina pectoris: I25.10

## 2016-06-06 HISTORY — DX: Coronary angioplasty status: Z98.61

## 2016-06-11 ENCOUNTER — Ambulatory Visit (INDEPENDENT_AMBULATORY_CARE_PROVIDER_SITE_OTHER): Payer: Medicare Other | Admitting: Pulmonary Disease

## 2016-06-11 ENCOUNTER — Encounter: Payer: Self-pay | Admitting: Pulmonary Disease

## 2016-06-11 DIAGNOSIS — J849 Interstitial pulmonary disease, unspecified: Secondary | ICD-10-CM

## 2016-06-11 DIAGNOSIS — I5032 Chronic diastolic (congestive) heart failure: Secondary | ICD-10-CM | POA: Diagnosis not present

## 2016-06-11 DIAGNOSIS — G4733 Obstructive sleep apnea (adult) (pediatric): Secondary | ICD-10-CM | POA: Diagnosis not present

## 2016-06-11 NOTE — Assessment & Plan Note (Signed)
Home sleep study will be scheduled. Pretest probability is high

## 2016-06-11 NOTE — Addendum Note (Signed)
Addended by: Valerie Salts on: 06/11/2016 02:26 PM   Modules accepted: Orders

## 2016-06-11 NOTE — Patient Instructions (Signed)
Check echocardiogram CT scan showed calcium deposits and valves  Stay on 5 mg prednisone every day  Home sleep study will be scheduled

## 2016-06-11 NOTE — Assessment & Plan Note (Signed)
Check echocardiogram CT scan showed calcium deposits and valves Concern for worsening mitral stenosis and pulmonary hypertension

## 2016-06-11 NOTE — Progress Notes (Signed)
   Subjective:    Patient ID: Kenneth [name], male    DOB: 1945/07/18, 71 y.o.   MRN: [id number]  HPI  71 year old remote smoker (cigars) with psoriasis for FU of Steroid responsive ILD, favor NSIP   He has atrial fibrillation for 15 years s/p atrial ablation 01/2014 . He takes flecainide if his heart rate gets too high.  He was on Enbrel and methotrexate weekly for 3 years -switched to Humira.  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  - on 10mg  pred since 07/2014   06/11/2016  Chief Complaint  Patient presents with  . Follow-up    2 month follow up. Pt was unable to get PFT done today due to cough.    He feels that breathing is stable. Coughing is manageable in the daytime He does not get dyspneic walking short distances and can walk around a store. He decreased his prednisone to 5 mg about 2 weeks ago  He reports loud snoring, excessive daytime fatigue but denies daytime naps. He is very busy and has several meeting scheduled daily. He continues to be active and does woodwork. He cannot lie down flat and sleeps in a recliner  Could not perform PFTs today   Significant tests/ events CT angiogram 09/2007 which shows bilateral groundglass opacities but a repeat CT in 10/2007 and shows resolution of these infiltrates. Myocardial perfusion imaging on 11/2011 was normal.  01/27/2014 HRCT - patchy areas of very mild ground-glass attenuation and subpleural reticulation.  CHest HRCT 04/2016 >> slight worse , NSIP pattern    PFT 04/2015 >> ratio nml, FVC 61%, TLC 68%, DLCO 67%    Review of Systems neg for any significant sore throat, dysphagia, itching, sneezing, nasal congestion or excess/  purulent secretions, fever, chills, sweats, unintended wt loss, pleuritic or exertional cp, hempoptysis, orthopnea pnd or change in chronic leg swelling. Also denies presyncope, palpitations, heartburn, abdominal pain, nausea, vomiting, diarrhea or change in bowel or urinary habits, dysuria,hematuria, rash, arthralgias, visual complaints, headache, numbness weakness or ataxia.     Objective:   Physical Exam  Gen. Pleasant, obese, in no distress ENT - no lesions, no post nasal drip Neck: No JVD, no thyromegaly, no carotid bruits Lungs: no use of accessory muscles, no dullness to percussion, decreased without rales or rhonchi  Cardiovascular: Rhythm regular, heart sounds  normal, no murmurs or gallops, no peripheral edema Musculoskeletal: No deformities, no cyanosis or clubbing , no tremors        Assessment & Plan:

## 2016-06-11 NOTE — Assessment & Plan Note (Signed)
Stay on 5 mg prednisone every day If higher doses required then would consider immunosuppressant

## 2016-06-13 ENCOUNTER — Telehealth (HOSPITAL_COMMUNITY): Payer: Self-pay | Admitting: *Deleted

## 2016-06-13 DIAGNOSIS — R0609 Other forms of dyspnea: Principal | ICD-10-CM

## 2016-06-13 DIAGNOSIS — I480 Paroxysmal atrial fibrillation: Secondary | ICD-10-CM

## 2016-06-13 NOTE — Telephone Encounter (Signed)
Patient called in stating he is continuing to have increased "afib" and/or palpitations to the point he is unable to sleep some nights. He recently saw his pulmonologist who was concerned in regards to mild calcifications on CT scan of aorta. Discussed with Kenneth Palau NP --- recommends stress test, update echo and place a 30 day event monitor with follow up after all 3 test result with Dr. Rayann Heman. Kenneth [name] notified of recommendations.

## 2016-06-14 ENCOUNTER — Telehealth: Payer: Self-pay | Admitting: Pulmonary Disease

## 2016-06-14 MED ORDER — OSELTAMIVIR PHOSPHATE 75 MG PO CAPS: 75.0000 mg | ORAL_CAPSULE | Freq: Two times a day (BID) | ORAL | 0 refills | Status: DC

## 2016-06-14 NOTE — Telephone Encounter (Signed)
Fine to keep tamiflu on hand and start it right away if symptoms as long as had flu shot , if did not get flu shot then go ahead and use it

## 2016-06-14 NOTE — Telephone Encounter (Signed)
Spoke with pt wife and informed her of MWs recc. Pts wife states he has had his flu shot and is aware of MWs instructions. The medication was sent into pts pharmacy of choice. Nothing further is needed at this time.

## 2016-06-14 NOTE — Telephone Encounter (Signed)
Spoke with pt's wife. States that pt's sister recently passed away from the flu. Pt has been exposed to his sister earlier this week. He does not have any symptoms at this time. His wife would like to start preventative medicine due to his lung condition.  MW - please advise as RA is working 3pm [employer] and the pt's wife wants a response this morning. Thanks.

## 2016-06-25 ENCOUNTER — Ambulatory Visit (HOSPITAL_COMMUNITY)
Admission: RE | Admit: 2016-06-25 | Discharge: 2016-06-25 | Disposition: A | Discharge status: Home / Self Care | Payer: Medicare Other | Source: Ambulatory Visit | Attending: Nurse Practitioner | Admitting: Nurse Practitioner

## 2016-06-25 ENCOUNTER — Encounter (HOSPITAL_COMMUNITY): Payer: Self-pay | Admitting: Nurse Practitioner

## 2016-06-25 VITALS — BP 144/70 | HR 79 | Ht 75.0 in | Wt 267.6 lb

## 2016-06-25 DIAGNOSIS — R0602 Shortness of breath: Secondary | ICD-10-CM | POA: Insufficient documentation

## 2016-06-25 DIAGNOSIS — E119 Type 2 diabetes mellitus without complications: Secondary | ICD-10-CM | POA: Insufficient documentation

## 2016-06-25 DIAGNOSIS — Z85828 Personal history of other malignant neoplasm of skin: Secondary | ICD-10-CM | POA: Diagnosis not present

## 2016-06-25 DIAGNOSIS — Z79899 Other long term (current) drug therapy: Secondary | ICD-10-CM | POA: Insufficient documentation

## 2016-06-25 DIAGNOSIS — R5383 Other fatigue: Secondary | ICD-10-CM | POA: Diagnosis not present

## 2016-06-25 DIAGNOSIS — I4891 Unspecified atrial fibrillation: Secondary | ICD-10-CM | POA: Insufficient documentation

## 2016-06-25 DIAGNOSIS — K219 Gastro-esophageal reflux disease without esophagitis: Secondary | ICD-10-CM | POA: Diagnosis not present

## 2016-06-25 DIAGNOSIS — G4710 Hypersomnia, unspecified: Secondary | ICD-10-CM | POA: Diagnosis not present

## 2016-06-25 DIAGNOSIS — Z86010 Personal history of colonic polyps: Secondary | ICD-10-CM | POA: Diagnosis not present

## 2016-06-25 DIAGNOSIS — Z87442 Personal history of urinary calculi: Secondary | ICD-10-CM | POA: Insufficient documentation

## 2016-06-25 DIAGNOSIS — E785 Hyperlipidemia, unspecified: Secondary | ICD-10-CM | POA: Diagnosis not present

## 2016-06-25 DIAGNOSIS — H9190 Unspecified hearing loss, unspecified ear: Secondary | ICD-10-CM | POA: Insufficient documentation

## 2016-06-25 DIAGNOSIS — R0609 Other forms of dyspnea: Secondary | ICD-10-CM | POA: Diagnosis not present

## 2016-06-25 DIAGNOSIS — H3530 Unspecified macular degeneration: Secondary | ICD-10-CM | POA: Diagnosis not present

## 2016-06-25 DIAGNOSIS — I4892 Unspecified atrial flutter: Secondary | ICD-10-CM | POA: Diagnosis not present

## 2016-06-25 DIAGNOSIS — I10 Essential (primary) hypertension: Secondary | ICD-10-CM | POA: Diagnosis not present

## 2016-06-25 DIAGNOSIS — J449 Chronic obstructive pulmonary disease, unspecified: Secondary | ICD-10-CM | POA: Diagnosis not present

## 2016-06-25 DIAGNOSIS — M1990 Unspecified osteoarthritis, unspecified site: Secondary | ICD-10-CM | POA: Diagnosis not present

## 2016-06-25 DIAGNOSIS — L409 Psoriasis, unspecified: Secondary | ICD-10-CM | POA: Diagnosis not present

## 2016-06-25 DIAGNOSIS — J849 Interstitial pulmonary disease, unspecified: Secondary | ICD-10-CM | POA: Insufficient documentation

## 2016-06-25 LAB — BASIC METABOLIC PANEL
Anion gap: 9 (ref 5–15)
BUN: 11 mg/dL (ref 6–20)
CALCIUM: 9 mg/dL (ref 8.9–10.3)
CO2: 23 mmol/L (ref 22–32)
Chloride: 107 mmol/L (ref 101–111)
Creatinine, Ser: 1 mg/dL (ref 0.61–1.24)
GLUCOSE: 196 mg/dL — AB (ref 65–99)
Potassium: 4.4 mmol/L (ref 3.5–5.1)
SODIUM: 139 mmol/L (ref 135–145)

## 2016-06-25 LAB — CBC
HCT: 40.9 % (ref 39.0–52.0)
Hemoglobin: 13.7 g/dL (ref 13.0–17.0)
MCH: 28.2 pg (ref 26.0–34.0)
MCHC: 33.5 g/dL (ref 30.0–36.0)
MCV: 84.3 fL (ref 78.0–100.0)
PLATELETS: 198 10*3/uL (ref 150–400)
RBC: 4.85 MIL/uL (ref 4.22–5.81)
RDW: 13.4 % (ref 11.5–15.5)
WBC: 6.4 10*3/uL (ref 4.0–10.5)

## 2016-06-25 LAB — TSH: TSH: 1.018 u[IU]/mL (ref 0.350–4.500)

## 2016-06-25 NOTE — Patient Instructions (Addendum)
Heart Cath scheduled for Friday, March 23rd  - Arrive at the [facility] and go to admitting at 5:30am  -Do not eat or drink anything after midnight the night prior to your procedure.  - Take all your medication with a sip of water prior to arrival.  - You will not be able to drive home after your procedure.   Follow up determined by results of cath.

## 2016-06-25 NOTE — Progress Notes (Signed)
Patient ID: Kenneth [name], male   DOB: 12-13-1945, 71 y.o.   MRN: [id number]       PCP: No primary care provider on file.  Pulmonologist: Dr. Autumn Patty is a 71 y.o. male who presents today in [place] clinic s/p afib/flutter ablation 10/15. He reports doing well from a heart standpoint without any awareness of sustatined irregular heartbeat. He is off daily antiarrythmic and blood thinner. Has prn flecainide to use. He does have interstitial lung disease and is on daily prednisone.  Had a flair recently and had to increase prednisone to 10 mg daily. Unfortunately, he has not been able to reduce back to 5 mg without being symptomatic. He is having close f/u with Dr. Elsworth Soho. 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   He asked to be seen 2/27,  after feeling a few extra beats and thought he may be in a afib/flutter. He states that he has been very winded with his activities and has noted increased fatigue. 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 He was very winded when he got to clinic today but he got lost in the hospital and had to walk a distance. His EKG showed SR at 88 bpm. His description of irregular heart beat does sound like he is having PC's vrs afib or flutter. He has not had to use flecainide since his ablation, per Dr. Jackalyn Lombard note 11/2014, he was off anticoagulation with chadsvasc score of 2. He continues to see Dr. Elsworth Soho for ILD. He was reassured that his heart rhythm was SR and I thought he was having prematuire contractions. He was set up for an stress myoview, echo and monitor which is pending 3/27.  F/u in afib clinic 3/20. He feels "terrible." States he is not sleeping, very fatigued and short of breath with exertion that he feels is progressive over the last few months. He feels that he is out of rhythm but EKG shows SR with PAC/sinus arrhythmia. His sister just died unexpectedly from the flu  which is heavy on his mind.. He has to go on business to [place] over the next few days and he wants to know if he will live to come back.  He had f/u with Dr. Elsworth Soho and his lung status was thought to be stable with known ILD. However, he had OSA in the past thru the [place] that he never treated and Dr. Elsworth Soho wants to order another test. He states that he will fall asleep and then wake up short of breath.  He sleeps very poorly and is tired on awakening.  He had a chest Ct in January that showed [place] that had progressed slightly in comparison to 2016. It also showed CAD involving the LAD, Circumflex and Rt coronary arteries as well as aortic atherosclerosis. He denies exertional chest pain.    Today, he denies symptoms of palpitations,no presyncope, or syncope. Positive for fatigue, progressive dyspnea. exertional dyspnea   The patient is otherwise without complaint today as for above.  Past Medical History:  Diagnosis Date  . Adenomatous polyp of colon 2006  . Allergy   . Anemia   . Arthritis   . Atypical chest pain    a. 06/2003 Cath:  nl cors;  b. 11/2006 MV: no ischemia, nl LV  . Cataract   . COPD (chronic obstructive pulmonary disease) ([place])   . Deafness   . Diabetes mellitus  without complication ([place])   . Diverticulosis of colon (without mention of hemorrhage) 1999, 2006  . Dyspepsia   . Esophagitis 1999  . GERD (gastroesophageal reflux disease)   . Heart murmur   . Hiatal hernia 1999  . Hyperlipidemia   . Hypertension   . Kidney stones   . Macular degeneration    pt reports that he actually has a "retina problem' not MD  . Paroxysmal atrial fibrillation ([place])    a. 11/2003 Tikosyn initiated - subsequently d/c'd;  b. 2006 RFCA for Afib @ [facility];  c. 09/2011 Echo: EF 60-65%, Gr 2 DD;  d. 10/2011 DCCV  . Psoriasis   . Psoriasis 1967  . Skin cancer    on leg, left arm  . Sleep apnea    patient has had the sleep test, but states they said it didn't need treatment   Past Surgical History:    Procedure Laterality Date  . APPENDECTOMY  1996  . atrial fibrillation ablation  2006   Dr Rolland Porter at [facility]  . ATRIAL FIBRILLATION ABLATION N/A 01/13/2014   repeat PVI, also left atrial ablation performed with successfull ablation of LA flutter by Dr Rayann Heman  . CARDIOVERSION N/A 05/22/2013   Procedure: CARDIOVERSION;  Surgeon: [name], MD;  Location: [facility];  Service: Cardiovascular;  Laterality: N/A;  . CARDIOVERSION N/A 10/23/2011   Procedure: CARDIOVERSION;  Surgeon: Deboraha Sprang, MD;  Location: [facility] CATH LAB;  Service: Cardiovascular;  Laterality: N/A;  . CARDIOVERSION N/A 05/01/2012   Procedure: CARDIOVERSION;  Surgeon: Deboraha Sprang, MD;  Location: [facility] CATH LAB;  Service: Cardiovascular;  Laterality: N/A;  . COLONOSCOPY    . ESOPHAGUS SURGERY    . EYE SURGERY Bilateral    cataracts with lens implants  . KIDNEY STONE SURGERY  2010   Patient states he has had several kidney stones up until aroune 2010, none since  . NASAL SINUS SURGERY     x 2  . ROTATOR CUFF REPAIR    . TONSILLECTOMY      ROS- all systems are reviewed and negatives except as per HPI above  Current Outpatient Prescriptions  Medication Sig Dispense Refill  . Adalimumab 40 MG/0.8ML PNKT Inject 40 mg into the skin every 14 (fourteen) days. Fridays    . cetirizine (ZYRTEC) 10 MG tablet Take 10 mg by mouth daily.    Marland Kitchen diltiazem (CARTIA XT) 120 MG 24 hr capsule Take 1 capsule (120 mg total) by mouth daily. 30 capsule 9  . Magnesium 400 MG CAPS Take 400 mg by mouth daily.     . meloxicam (MOBIC) 15 MG tablet TAKE ONE TABLET BY MOUTH ONCE DAILY    . naproxen sodium (ANAPROX) 220 MG tablet Take by mouth.    Marland Kitchen omeprazole (PRILOSEC) 20 MG capsule Take 20 mg by mouth 2 (two) times daily before a meal.     . predniSONE (DELTASONE) 5 MG tablet TAKE ONE TABLET BY MOUTH ONCE DAILY WITH BREAKFAST 60 tablet 1  . Probiotic Product (ALIGN) 4 MG CAPS Take 4 mg by mouth daily.    . flecainide (TAMBOCOR) 100 MG tablet Take 3  tablets (300 mg total) by mouth as needed. Take 300mg  if heart rate reaches 140 beats per minute and monitor heart rate for 3 hours, if not returned to normal-go to [facility] ER (Patient not taking: Reported on 06/25/2016) 6 tablet 1  . predniSONE (DELTASONE) 10 MG tablet Take 10 mg by mouth daily with breakfast.  Current Facility-Administered Medications  Medication Dose Route Frequency Provider Last Rate Last Dose  . 0.9 %  sodium chloride infusion  500 mL Intravenous Continuous Ladene Artist, MD       Facility-Administered Medications Ordered in Other Encounters  Medication Dose Route Frequency Provider Last Rate Last Dose  . lidocaine (cardiac) 100 mg/58ml (XYLOCAINE) 20 MG/ML injection 2%    Anesthesia Intra-op Durwin Glaze Flowers, CRNA   60 mg at 03/25/13 1617  . propofol (DIPRIVAN) 10 mg/mL bolus/IV push    Anesthesia Intra-op Durwin Glaze Flowers, CRNA   90 mg at 03/25/13 1617    Physical Exam: Vitals:   06/25/16 1133  BP: (!) 144/70  Pulse: 79  Weight: 267 lb 9.6 oz (121.4 kg)  Height: 6\' 3"  (1.905 m)  BP rechecked at 148/80  GEN- The patient is well appearing, alert and oriented x 3 today.   Head- normocephalic, atraumatic Eyes-  Sclera clear, conjunctiva pink Ears- hearing intact Oropharynx- clear Lungs-   Normal work of breathing Heart- Regular rate and rhythm, no murmurs, rubs or gallops, PMI not laterally displaced. GI- soft, NT, ND, + BS Extremities- no clubbing, cyanosis, or edema  EKG- NSR with PAC, 79 bpm,pr int 182 ms, Qrs int 84 ms, qtc 440 ms Epic records reviewed   Assessment and Plan:  1. Afib/ atypical flutter Doing well s/p ablation wtihout recurrence I feel he had PC's by his description over the last few days, he is in SR Continue cardizem chads2vasc score is 2. Off xarelto per pt wishes Monitor is pending  2. ILD Continue f/u with  pulmonologist  3. HTN Good today but usually elevated, pt states improved at home  4.. Fatigue, shortness of  breath, progressive per pt He has been chronically short of breath for several years but he feels that dyspnea has worsened over the last few months and pulmonology feels that lung status is stable He does have suggested triple vessel CAD by Chest CT and dyspnea/fatigue could be anginal equivalents  Will cancel stress myoview and schedule for RHC/LHC pending 3/23. He will continue with plans for echo and monitor 3/27 Encouraged to continue with plans for sleep study with Dr. Elsworth Soho with poor sleep, daytime somnolence  Bmet/cbc today He was encouraged not to go to [facility] for business but stay local, pt deferred my advice  F/u in  afib clinic as needed F/u with Dr. Elsworth Soho as scheduled  Dr.Allred 5/14   Butch Penny C. Eliska Hamil, [facility] [address] [place], [place] [address] [phone]

## 2016-06-27 DIAGNOSIS — R0609 Other forms of dyspnea: Secondary | ICD-10-CM

## 2016-06-27 DIAGNOSIS — I25119 Atherosclerotic heart disease of native coronary artery with unspecified angina pectoris: Secondary | ICD-10-CM | POA: Diagnosis present

## 2016-06-28 ENCOUNTER — Encounter (HOSPITAL_COMMUNITY): Payer: Self-pay | Admitting: General Practice

## 2016-06-28 ENCOUNTER — Encounter (HOSPITAL_COMMUNITY)
Admission: RE | Disposition: A | Discharge status: Home / Self Care | Payer: Self-pay | Source: Ambulatory Visit | Attending: Cardiology

## 2016-06-28 ENCOUNTER — Ambulatory Visit (HOSPITAL_COMMUNITY)
Admission: RE | Admit: 2016-06-28 | Discharge: 2016-06-29 | Disposition: A | Discharge status: Home / Self Care | Payer: Medicare Other | Source: Ambulatory Visit | Attending: Cardiology | Admitting: Cardiology

## 2016-06-28 DIAGNOSIS — I2729 Other secondary pulmonary hypertension: Secondary | ICD-10-CM | POA: Diagnosis not present

## 2016-06-28 DIAGNOSIS — K219 Gastro-esophageal reflux disease without esophagitis: Secondary | ICD-10-CM | POA: Diagnosis not present

## 2016-06-28 DIAGNOSIS — I5032 Chronic diastolic (congestive) heart failure: Secondary | ICD-10-CM | POA: Diagnosis not present

## 2016-06-28 DIAGNOSIS — K449 Diaphragmatic hernia without obstruction or gangrene: Secondary | ICD-10-CM | POA: Diagnosis not present

## 2016-06-28 DIAGNOSIS — Z9861 Coronary angioplasty status: Secondary | ICD-10-CM | POA: Diagnosis present

## 2016-06-28 DIAGNOSIS — D649 Anemia, unspecified: Secondary | ICD-10-CM | POA: Insufficient documentation

## 2016-06-28 DIAGNOSIS — E119 Type 2 diabetes mellitus without complications: Secondary | ICD-10-CM | POA: Insufficient documentation

## 2016-06-28 DIAGNOSIS — Z7982 Long term (current) use of aspirin: Secondary | ICD-10-CM | POA: Diagnosis not present

## 2016-06-28 DIAGNOSIS — G4733 Obstructive sleep apnea (adult) (pediatric): Secondary | ICD-10-CM | POA: Diagnosis not present

## 2016-06-28 DIAGNOSIS — Z7901 Long term (current) use of anticoagulants: Secondary | ICD-10-CM

## 2016-06-28 DIAGNOSIS — Z7902 Long term (current) use of antithrombotics/antiplatelets: Secondary | ICD-10-CM | POA: Diagnosis not present

## 2016-06-28 DIAGNOSIS — I484 Atypical atrial flutter: Secondary | ICD-10-CM | POA: Diagnosis not present

## 2016-06-28 DIAGNOSIS — H9190 Unspecified hearing loss, unspecified ear: Secondary | ICD-10-CM | POA: Diagnosis not present

## 2016-06-28 DIAGNOSIS — I110 Hypertensive heart disease with heart failure: Secondary | ICD-10-CM | POA: Diagnosis not present

## 2016-06-28 DIAGNOSIS — M1990 Unspecified osteoarthritis, unspecified site: Secondary | ICD-10-CM | POA: Insufficient documentation

## 2016-06-28 DIAGNOSIS — J849 Interstitial pulmonary disease, unspecified: Secondary | ICD-10-CM | POA: Diagnosis not present

## 2016-06-28 DIAGNOSIS — H3530 Unspecified macular degeneration: Secondary | ICD-10-CM | POA: Diagnosis not present

## 2016-06-28 DIAGNOSIS — I480 Paroxysmal atrial fibrillation: Secondary | ICD-10-CM | POA: Insufficient documentation

## 2016-06-28 DIAGNOSIS — I2584 Coronary atherosclerosis due to calcified coronary lesion: Secondary | ICD-10-CM | POA: Insufficient documentation

## 2016-06-28 DIAGNOSIS — I2510 Atherosclerotic heart disease of native coronary artery without angina pectoris: Secondary | ICD-10-CM

## 2016-06-28 DIAGNOSIS — I25119 Atherosclerotic heart disease of native coronary artery with unspecified angina pectoris: Secondary | ICD-10-CM | POA: Diagnosis present

## 2016-06-28 DIAGNOSIS — R0609 Other forms of dyspnea: Secondary | ICD-10-CM | POA: Diagnosis present

## 2016-06-28 DIAGNOSIS — J449 Chronic obstructive pulmonary disease, unspecified: Secondary | ICD-10-CM | POA: Insufficient documentation

## 2016-06-28 DIAGNOSIS — E785 Hyperlipidemia, unspecified: Secondary | ICD-10-CM | POA: Diagnosis not present

## 2016-06-28 DIAGNOSIS — Z7952 Long term (current) use of systemic steroids: Secondary | ICD-10-CM | POA: Diagnosis not present

## 2016-06-28 HISTORY — DX: Personal history of urinary calculi: Z87.442

## 2016-06-28 HISTORY — PX: RIGHT/LEFT HEART CATH AND CORONARY ANGIOGRAPHY: CATH118266

## 2016-06-28 HISTORY — PX: CORONARY STENT INTERVENTION: CATH118234

## 2016-06-28 HISTORY — PX: INTRAVASCULAR PRESSURE WIRE/FFR STUDY: CATH118243

## 2016-06-28 HISTORY — DX: Puckering of macula, unspecified eye: H35.379

## 2016-06-28 HISTORY — DX: Other seasonal allergic rhinitis: J30.2

## 2016-06-28 LAB — POCT I-STAT 3, VENOUS BLOOD GAS (G3P V)
Acid-base deficit: 1 mmol/L (ref 0.0–2.0)
Bicarbonate: 23.6 mmol/L (ref 20.0–28.0)
O2 SAT: 73 %
PCO2 VEN: 39.8 mmHg — AB (ref 44.0–60.0)
TCO2: 25 mmol/L (ref 0–100)
pH, Ven: 7.38 (ref 7.250–7.430)
pO2, Ven: 39 mmHg (ref 32.0–45.0)

## 2016-06-28 LAB — GLUCOSE, CAPILLARY
GLUCOSE-CAPILLARY: 176 mg/dL — AB (ref 65–99)
Glucose-Capillary: 152 mg/dL — ABNORMAL HIGH (ref 65–99)
Glucose-Capillary: 191 mg/dL — ABNORMAL HIGH (ref 65–99)

## 2016-06-28 LAB — POCT I-STAT 3, ART BLOOD GAS (G3+)
ACID-BASE DEFICIT: 2 mmol/L (ref 0.0–2.0)
Bicarbonate: 21.7 mmol/L (ref 20.0–28.0)
O2 SAT: 99 %
TCO2: 23 mmol/L (ref 0–100)
pCO2 arterial: 33 mmHg (ref 32.0–48.0)
pH, Arterial: 7.426 (ref 7.350–7.450)
pO2, Arterial: 118 mmHg — ABNORMAL HIGH (ref 83.0–108.0)

## 2016-06-28 LAB — PROTIME-INR
INR: 1.16
PROTHROMBIN TIME: 14.9 s (ref 11.4–15.2)

## 2016-06-28 LAB — POCT ACTIVATED CLOTTING TIME: Activated Clotting Time: 571 seconds

## 2016-06-28 SURGERY — RIGHT/LEFT HEART CATH AND CORONARY ANGIOGRAPHY
Anesthesia: LOCAL

## 2016-06-28 MED ORDER — IOPAMIDOL (ISOVUE-370) INJECTION 76%
INTRAVENOUS | Status: AC
  Filled 2016-06-28: qty 125

## 2016-06-28 MED ORDER — MIDAZOLAM HCL 2 MG/2ML IJ SOLN
INTRAMUSCULAR | Status: DC | PRN
  Administered 2016-06-28: 1 mg via INTRAVENOUS

## 2016-06-28 MED ORDER — CLOPIDOGREL BISULFATE 300 MG PO TABS
ORAL_TABLET | ORAL | Status: AC
  Filled 2016-06-28: qty 2

## 2016-06-28 MED ORDER — SODIUM CHLORIDE 0.9 % WEIGHT BASED INFUSION
3.0000 mL/kg/h | INTRAVENOUS | Status: DC
  Administered 2016-06-28: 3 mL/kg/h via INTRAVENOUS

## 2016-06-28 MED ORDER — SODIUM CHLORIDE 0.9 % WEIGHT BASED INFUSION
1.0000 mL/kg/h | INTRAVENOUS | Status: AC
  Administered 2016-06-28: 1 mL/kg/h via INTRAVENOUS

## 2016-06-28 MED ORDER — ASPIRIN 81 MG PO CHEW
81.0000 mg | CHEWABLE_TABLET | Freq: Every day | ORAL | Status: DC
  Administered 2016-06-29: 81 mg via ORAL
  Filled 2016-06-28: qty 1

## 2016-06-28 MED ORDER — CLOPIDOGREL BISULFATE 300 MG PO TABS
ORAL_TABLET | ORAL | Status: DC | PRN
  Administered 2016-06-28: 600 mg via ORAL

## 2016-06-28 MED ORDER — BIOTIN 1000 MCG PO TABS: 1000.0000 ug | ORAL_TABLET | Freq: Every day | ORAL | Status: DC

## 2016-06-28 MED ORDER — IOPAMIDOL (ISOVUE-370) INJECTION 76%
INTRAVENOUS | Status: AC
  Filled 2016-06-28: qty 100

## 2016-06-28 MED ORDER — ONDANSETRON HCL 4 MG/2ML IJ SOLN: 4.0000 mg | Freq: Four times a day (QID) | INTRAMUSCULAR | Status: DC | PRN

## 2016-06-28 MED ORDER — SODIUM CHLORIDE 0.9% FLUSH: 3.0000 mL | INTRAVENOUS | Status: DC | PRN

## 2016-06-28 MED ORDER — POLYETHYL GLYCOL-PROPYL GLYCOL 0.4-0.3 % OP GEL: 1.0000 "application " | Freq: Every day | OPHTHALMIC | Status: DC

## 2016-06-28 MED ORDER — SODIUM CHLORIDE 0.9 % IV SOLN: 250.0000 mL | INTRAVENOUS | Status: DC | PRN

## 2016-06-28 MED ORDER — HEPARIN SODIUM (PORCINE) 1000 UNIT/ML IJ SOLN
INTRAMUSCULAR | Status: DC | PRN
  Administered 2016-06-28: 6000 [IU] via INTRAVENOUS

## 2016-06-28 MED ORDER — SODIUM CHLORIDE 0.9% FLUSH: 3.0000 mL | Freq: Two times a day (BID) | INTRAVENOUS | Status: DC

## 2016-06-28 MED ORDER — LIDOCAINE HCL (PF) 1 % IJ SOLN
INTRAMUSCULAR | Status: AC
  Filled 2016-06-28: qty 30

## 2016-06-28 MED ORDER — SODIUM CHLORIDE 0.9% FLUSH
3.0000 mL | Freq: Two times a day (BID) | INTRAVENOUS | Status: DC
  Administered 2016-06-28 (×2): 3 mL via INTRAVENOUS

## 2016-06-28 MED ORDER — LIDOCAINE HCL (PF) 1 % IJ SOLN
INTRAMUSCULAR | Status: DC | PRN
  Administered 2016-06-28: 18 mL via SUBCUTANEOUS
  Administered 2016-06-28 (×2): 2 mL via SUBCUTANEOUS

## 2016-06-28 MED ORDER — BIVALIRUDIN 250 MG IV SOLR
INTRAVENOUS | Status: AC
  Filled 2016-06-28: qty 250

## 2016-06-28 MED ORDER — CLOPIDOGREL BISULFATE 75 MG PO TABS
75.0000 mg | ORAL_TABLET | Freq: Every day | ORAL | Status: DC
  Administered 2016-06-29: 08:00:00 75 mg via ORAL
  Filled 2016-06-28: qty 1

## 2016-06-28 MED ORDER — LABETALOL HCL 5 MG/ML IV SOLN: 10.0000 mg | INTRAVENOUS | Status: AC | PRN

## 2016-06-28 MED ORDER — SODIUM CHLORIDE 0.9 % IV SOLN
INTRAVENOUS | Status: DC | PRN
  Administered 2016-06-28: 10 mL/h via INTRAVENOUS

## 2016-06-28 MED ORDER — IOPAMIDOL (ISOVUE-370) INJECTION 76%
INTRAVENOUS | Status: AC
  Filled 2016-06-28: qty 50

## 2016-06-28 MED ORDER — ASPIRIN 81 MG PO CHEW: 81.0000 mg | CHEWABLE_TABLET | ORAL | Status: DC

## 2016-06-28 MED ORDER — VERAPAMIL HCL 2.5 MG/ML IV SOLN
INTRAVENOUS | Status: AC
  Filled 2016-06-28: qty 2

## 2016-06-28 MED ORDER — IOPAMIDOL (ISOVUE-370) INJECTION 76%
INTRAVENOUS | Status: DC | PRN
  Administered 2016-06-28: 330 mL via INTRA_ARTERIAL

## 2016-06-28 MED ORDER — PREDNISONE 5 MG PO TABS
5.0000 mg | ORAL_TABLET | Freq: Every day | ORAL | Status: DC
  Administered 2016-06-29: 08:00:00 5 mg via ORAL
  Filled 2016-06-28 (×2): qty 1

## 2016-06-28 MED ORDER — ASPIRIN 81 MG PO CHEW
CHEWABLE_TABLET | ORAL | Status: AC
  Administered 2016-06-28: 81 mg
  Filled 2016-06-28: qty 1

## 2016-06-28 MED ORDER — PHILLIPS COLON HEALTH PO CAPS: 1.0000 | ORAL_CAPSULE | Freq: Every day | ORAL | Status: DC

## 2016-06-28 MED ORDER — HEPARIN (PORCINE) IN NACL 2-0.9 UNIT/ML-% IJ SOLN
INTRAMUSCULAR | Status: DC | PRN
  Administered 2016-06-28: 2000 mL

## 2016-06-28 MED ORDER — LORATADINE 10 MG PO TABS
10.0000 mg | ORAL_TABLET | Freq: Every day | ORAL | Status: DC
  Administered 2016-06-29: 09:00:00 10 mg via ORAL
  Filled 2016-06-28: qty 1

## 2016-06-28 MED ORDER — PANTOPRAZOLE SODIUM 40 MG PO TBEC
40.0000 mg | DELAYED_RELEASE_TABLET | Freq: Every day | ORAL | Status: DC
  Administered 2016-06-29: 40 mg via ORAL
  Filled 2016-06-28 (×2): qty 1

## 2016-06-28 MED ORDER — BIVALIRUDIN 250 MG IV SOLR
INTRAVENOUS | Status: DC | PRN
  Administered 2016-06-28 (×2): 1.75 mg/kg/h via INTRAVENOUS

## 2016-06-28 MED ORDER — ANGIOPLASTY BOOK
Freq: Once | Status: AC
  Administered 2016-06-28
  Filled 2016-06-28: qty 1

## 2016-06-28 MED ORDER — BIVALIRUDIN BOLUS VIA INFUSION - CUPID
INTRAVENOUS | Status: DC | PRN
  Administered 2016-06-28: 90.525 mg via INTRAVENOUS

## 2016-06-28 MED ORDER — ADENOSINE 12 MG/4ML IV SOLN
INTRAVENOUS | Status: AC
  Filled 2016-06-28: qty 16

## 2016-06-28 MED ORDER — HEPARIN (PORCINE) IN NACL 2-0.9 UNIT/ML-% IJ SOLN
INTRAMUSCULAR | Status: AC
  Filled 2016-06-28: qty 1000

## 2016-06-28 MED ORDER — ZOLPIDEM TARTRATE 5 MG PO TABS: 5.0000 mg | ORAL_TABLET | Freq: Every evening | ORAL | Status: DC | PRN

## 2016-06-28 MED ORDER — HYDRALAZINE HCL 20 MG/ML IJ SOLN: 5.0000 mg | INTRAMUSCULAR | Status: AC | PRN

## 2016-06-28 MED ORDER — MIDAZOLAM HCL 2 MG/2ML IJ SOLN
INTRAMUSCULAR | Status: AC
  Filled 2016-06-28: qty 2

## 2016-06-28 MED ORDER — FENTANYL CITRATE (PF) 100 MCG/2ML IJ SOLN
INTRAMUSCULAR | Status: AC
Start: 2016-06-28 — End: 2016-06-28
  Filled 2016-06-28: qty 2

## 2016-06-28 MED ORDER — POLYVINYL ALCOHOL 1.4 % OP SOLN: Freq: Every day | OPHTHALMIC | Status: DC | PRN

## 2016-06-28 MED ORDER — INSULIN ASPART 100 UNIT/ML ~~LOC~~ SOLN
0.0000 [IU] | Freq: Three times a day (TID) | SUBCUTANEOUS | Status: DC
  Administered 2016-06-29: 2 [IU] via SUBCUTANEOUS

## 2016-06-28 MED ORDER — FENTANYL CITRATE (PF) 100 MCG/2ML IJ SOLN
INTRAMUSCULAR | Status: DC | PRN
  Administered 2016-06-28: 25 ug via INTRAVENOUS

## 2016-06-28 MED ORDER — INSULIN ASPART 100 UNIT/ML ~~LOC~~ SOLN: 0.0000 [IU] | Freq: Every day | SUBCUTANEOUS | Status: DC

## 2016-06-28 MED ORDER — SODIUM CHLORIDE 0.9 % WEIGHT BASED INFUSION
1.0000 mL/kg/h | INTRAVENOUS | Status: DC
Start: 2016-06-29 — End: 2016-06-28

## 2016-06-28 MED ORDER — ADENOSINE (DIAGNOSTIC) 140MCG/KG/MIN
INTRAVENOUS | Status: DC | PRN
  Administered 2016-06-28: 140 ug/kg/min via INTRAVENOUS

## 2016-06-28 MED ORDER — VERAPAMIL HCL 2.5 MG/ML IV SOLN
INTRAVENOUS | Status: DC | PRN
  Administered 2016-06-28: 10 mL via INTRA_ARTERIAL

## 2016-06-28 MED ORDER — DILTIAZEM HCL ER COATED BEADS 120 MG PO CP24
120.0000 mg | ORAL_CAPSULE | Freq: Every evening | ORAL | Status: DC
  Administered 2016-06-28: 120 mg via ORAL
  Filled 2016-06-28 (×2): qty 1

## 2016-06-28 MED ORDER — HEPARIN SODIUM (PORCINE) 1000 UNIT/ML IJ SOLN
INTRAMUSCULAR | Status: AC
  Filled 2016-06-28: qty 1

## 2016-06-28 SURGICAL SUPPLY — 29 items
BALLN MOZEC 2.0X12 (BALLOONS) ×2
BALLN MOZEC 2.50X14 (BALLOONS) ×2
BALLN ~~LOC~~ MOZEC 3.0X18 (BALLOONS) ×2
BALLOON MOZEC 2.0X12 (BALLOONS) IMPLANT
BALLOON MOZEC 2.50X14 (BALLOONS) IMPLANT
BALLOON ~~LOC~~ MOZEC 3.0X18 (BALLOONS) IMPLANT
CATH BALLN WEDGE 5F 110CM (CATHETERS) ×1 IMPLANT
CATH EXPO 5FR ANG PIGTAIL 145 (CATHETERS) ×1 IMPLANT
CATH INFINITI JR4 5F (CATHETERS) ×1 IMPLANT
CATH MICROCATH NAVVUS (MICROCATHETER) IMPLANT
CATH OPTITORQUE TIG 4.0 5F (CATHETERS) ×1 IMPLANT
CATH SWAN GANZ 7F STRAIGHT (CATHETERS) ×1 IMPLANT
CATH VISTA GUIDE 6FR XBLAD3.5 (CATHETERS) ×1 IMPLANT
DEVICE RAD COMP TR BAND LRG (VASCULAR PRODUCTS) ×1 IMPLANT
GLIDESHEATH SLEND A-KIT 6F 22G (SHEATH) ×1 IMPLANT
GUIDEWIRE .025 260CM (WIRE) ×1 IMPLANT
GUIDEWIRE INQWIRE 1.5J.035X260 (WIRE) IMPLANT
INQWIRE 1.5J .035X260CM (WIRE) ×2
KIT ENCORE 26 ADVANTAGE (KITS) ×1 IMPLANT
KIT HEART LEFT (KITS) ×2 IMPLANT
MICROCATHETER NAVVUS (MICROCATHETER) ×2
PACK CARDIAC CATHETERIZATION (CUSTOM PROCEDURE TRAY) ×2 IMPLANT
SHEATH FAST CATH BRACH 5F 5CM (SHEATH) ×1 IMPLANT
SHEATH PINNACLE 7F 10CM (SHEATH) ×1 IMPLANT
STENT SYNERGY DES 2.75X20 (Permanent Stent) ×1 IMPLANT
SYR MEDRAD MARK V 150ML (SYRINGE) ×2 IMPLANT
TRANSDUCER W/STOPCOCK (MISCELLANEOUS) ×2 IMPLANT
TUBING CIL FLEX 10 FLL-RA (TUBING) ×2 IMPLANT
WIRE MARVEL STR TIP 190CM (WIRE) ×1 IMPLANT

## 2016-06-28 NOTE — Care Management Note (Addendum)
Case Management Note  Patient Details  Name: Kenneth [name] MRN: [id number] Date of Birth: 1945-04-29  Subjective/Objective:   s/p coronary stent intervention, will be on plavix , from home with wife, has medication coverage and transportation and has PCP, Dr, Zigmund Daniel at [facility].  Fax number is [phone], NCM will need to fax dc summary to PCP at discharge.   NCM will cont to follow for dc needs.               Action/Plan:   Expected Discharge Date:                  Expected Discharge Plan:  Home/Self Care  In-House Referral:     Discharge planning Services  CM Consult  Post Acute Care Choice:    Choice offered to:     DME Arranged:    DME Agency:     HH Arranged:    HH Agency:     Status of Service:  Completed, signed off  If discussed at [employer] of Stay Meetings, dates discussed:    Additional Comments:  Zenon Mayo, RN 06/28/2016, 11:44 AM

## 2016-06-28 NOTE — Progress Notes (Signed)
Site area: right groin (VENOUS SHEATH)  Site Prior to Removal:  Level 1  Pressure Applied For 12 MINUTES    Minutes Beginning at 1245  Manual:   Yes.    Patient Status During Pull:  AAO X 4  Post Pull Groin Site:  Level 1 ( SMALL BRUISE, SOFT) Post Pull Instructions Given:  Yes.    Post Pull Pulses Present:  Yes.    Dressing Applied:  Yes.    Comments:  TOLERATED PROCEDURE WELL

## 2016-06-28 NOTE — Brief Op Note (Signed)
   BRIEF R&LHC-PCI NOTE  06/28/2016  10:31 AM  PATIENT:  Kenneth [name]  71 y.o. male  PRE-OPERATIVE DIAGNOSIS:  Sob on exertion - coronary calcification  POST-OPERATIVE DIAGNOSIS:   Mild Secondary Pulmonary HTN  Severe 2 Vessel disease - 95% mRCA s/p PCI with DES; ~70% prox LAD with FFR 0.75  Codominant Cx with distal ~60% lesion prior to bifurcation into distal LPL branches  PROCEDURE:  Procedure(s): Right/Left Heart Cath and Coronary Angiography (N/A) Intravascular Pressure Wire/FFR Study (N/A) Coronary Stent Intervention (N/A)  SURGEON:  Surgeon(s) and Role:    * Leonie Man, MD - Primary  EBL:  Total I/O In: -  Out: 825 [Urine:825]  < 50 mL   CATH PROCEDURE:   Right brachial IV exchanged for a, unable to advance a 5 Fr Right heart catheter, therefore, brachial access was aborted for Femoral  R Femoral venous sheath 45f inserted - Seldinger technique (~2-3 cm hematoma noted post procedure)  R Radial 6Fr Glide Sheath - Seldinger technique, micropuncture kit  5 Fr TIG 4.0 - LCA Angio --> JR4 -RCA Angio --> Angled Pigtail --> LV Hemodynamics & LV GRam  PCI RCA: 6 FR JR4 Guide - Marvel Wire -> 2.0 x 12 then 2.5 x 14 balloons for pre-dilation --> Synergy DES 2.75 mm x 20 mm (3.0 mm)  FFR LAD: 6 Fr XB LAD 2.5 Guide - Marvel Wire - Acist FFR Catheter --> Adenosine x ~1 min (FFR from 0.91 to 0..75) Physiologically significant --> Plan Staged PCI  Guide catheter removed out of body over wire  Sheath removed - TR Ban Applied 1025 h ~ 15 Atm  MEDICATIONS USED:  SQ Lidocaine; 1 mg Versed, 25 mg fentanyl; adenosine infusion,  DICTATION: .Note written in EPIC  PLAN OF CARE: Overnight monitoring post cath. He will need close monitoring of the right groin hematoma. Likely discharge tomorrow. We'll start scheduled PCI LAD 1st week of April  PATIENT DISPOSITION:  PACU - hemodynamically stable.    DGlenetta Hew MD

## 2016-06-28 NOTE — Interval H&P Note (Signed)
History and Physical Interval Note:  06/28/2016 7:19 AM  Kenneth [name]  has presented today for surgery, with the diagnosis of progressive sob as possible angina equivalent with coronary calcification of 3 main coronary arteries on CT.   The various methods of treatment have been discussed with the patient and family. After consideration of risks, benefits and other options for treatment, the patient has consented to  Procedure(s): Right/Left Heart Cath and Coronary Angiography (N/A) with possible Percutaneous Coronary Intervention as a surgical intervention .  The patient's history has been reviewed, patient examined, no change in status, stable for surgery.  I have reviewed the patient's chart and labs.  Questions were answered to the patient's satisfaction.    Cath Lab Visit (complete for each Cath Lab visit)  Clinical Evaluation Leading to the Procedure:   ACS: No.  Non-ACS:    Anginal Classification: CCS III - as exertional dyspnea  Anti-ischemic medical therapy: Minimal Therapy (1 class of medications)  Non-Invasive Test Results: No non-invasive testing performed  Prior CABG: No previous CABG    [name]

## 2016-06-28 NOTE — Progress Notes (Signed)
RN from floor called because the patients CBG is elevated at 170. He does not have sliding scale insulin ordered. Last A1C is 6.5 ordered to recheck. Sliding scale ordered.

## 2016-06-28 NOTE — H&P (View-Only) (Signed)
Patient ID: Kenneth [name], male   DOB: 1945-06-16, 71 y.o.   MRN: [id number]       PCP: No primary care provider on file.  Pulmonologist: Dr. Autumn Patty is a 71 y.o. male who presents today in [place] clinic s/p afib/flutter ablation 10/15. He reports doing well from a heart standpoint without any awareness of sustatined irregular heartbeat. He is off daily antiarrythmic and blood thinner. Has prn flecainide to use. He does have interstitial lung disease and is on daily prednisone.  Had a flair recently and had to increase prednisone to 10 mg daily. Unfortunately, he has not been able to reduce back to 5 mg without being symptomatic. He is having close f/u with Dr. Elsworth Soho. 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   He asked to be seen 2/27,  after feeling a few extra beats and thought he may be in a afib/flutter. He states that he has been very winded with his activities and has noted increased fatigue. 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 He was very winded when he got to clinic today but he got lost in the hospital and had to walk a distance. His EKG showed SR at 88 bpm. His description of irregular heart beat does sound like he is having PC's vrs afib or flutter. He has not had to use flecainide since his ablation, per Dr. Jackalyn Lombard note 11/2014, he was off anticoagulation with chadsvasc score of 2. He continues to see Dr. Elsworth Soho for ILD. He was reassured that his heart rhythm was SR and I thought he was having prematuire contractions. He was set up for an stress myoview, echo and monitor which is pending 3/27.  F/u in afib clinic 3/20. He feels "terrible." States he is not sleeping, very fatigued and short of breath with exertion that he feels is progressive over the last few months. He feels that he is out of rhythm but EKG shows SR with PAC/sinus arrhythmia. His sister just died unexpectedly from the flu  which is heavy on his mind.. He has to go on business to [place] over the next few days and he wants to know if he will live to come back.  He had f/u with Dr. Elsworth Soho and his lung status was thought to be stable with known ILD. However, he had OSA in the past thru the [place] that he never treated and Dr. Elsworth Soho wants to order another test. He states that he will fall asleep and then wake up short of breath.  He sleeps very poorly and is tired on awakening.  He had a chest Ct in January that showed [place] that had progressed slightly in comparison to 2016. It also showed CAD involving the LAD, Circumflex and Rt coronary arteries as well as aortic atherosclerosis. He denies exertional chest pain.    Today, he denies symptoms of palpitations,no presyncope, or syncope. Positive for fatigue, progressive dyspnea. exertional dyspnea   The patient is otherwise without complaint today as for above.  Past Medical History:  Diagnosis Date  . Adenomatous polyp of colon 2006  . Allergy   . Anemia   . Arthritis   . Atypical chest pain    a. 06/2003 Cath:  nl cors;  b. 11/2006 MV: no ischemia, nl LV  . Cataract   . COPD (chronic obstructive pulmonary disease) ([place])   . Deafness   . Diabetes mellitus  without complication ([place])   . Diverticulosis of colon (without mention of hemorrhage) 1999, 2006  . Dyspepsia   . Esophagitis 1999  . GERD (gastroesophageal reflux disease)   . Heart murmur   . Hiatal hernia 1999  . Hyperlipidemia   . Hypertension   . Kidney stones   . Macular degeneration    pt reports that he actually has a "retina problem' not MD  . Paroxysmal atrial fibrillation ([place])    a. 11/2003 Tikosyn initiated - subsequently d/c'd;  b. 2006 RFCA for Afib @ [facility];  c. 09/2011 Echo: EF 60-65%, Gr 2 DD;  d. 10/2011 DCCV  . Psoriasis   . Psoriasis 1967  . Skin cancer    on leg, left arm  . Sleep apnea    patient has had the sleep test, but states they said it didn't need treatment   Past Surgical History:    Procedure Laterality Date  . APPENDECTOMY  1996  . atrial fibrillation ablation  2006   Dr Rolland Porter at [facility]  . ATRIAL FIBRILLATION ABLATION N/A 01/13/2014   repeat PVI, also left atrial ablation performed with successfull ablation of LA flutter by Dr Rayann Heman  . CARDIOVERSION N/A 05/22/2013   Procedure: CARDIOVERSION;  Surgeon: [name], MD;  Location: [place];  Service: Cardiovascular;  Laterality: N/A;  . CARDIOVERSION N/A 10/23/2011   Procedure: CARDIOVERSION;  Surgeon: Deboraha Sprang, MD;  Location: [facility] CATH LAB;  Service: Cardiovascular;  Laterality: N/A;  . CARDIOVERSION N/A 05/01/2012   Procedure: CARDIOVERSION;  Surgeon: Deboraha Sprang, MD;  Location: [facility] CATH LAB;  Service: Cardiovascular;  Laterality: N/A;  . COLONOSCOPY    . ESOPHAGUS SURGERY    . EYE SURGERY Bilateral    cataracts with lens implants  . KIDNEY STONE SURGERY  2010   Patient states he has had several kidney stones up until aroune 2010, none since  . NASAL SINUS SURGERY     x 2  . ROTATOR CUFF REPAIR    . TONSILLECTOMY      ROS- all systems are reviewed and negatives except as per HPI above  Current Outpatient Prescriptions  Medication Sig Dispense Refill  . Adalimumab 40 MG/0.8ML PNKT Inject 40 mg into the skin every 14 (fourteen) days. Fridays    . cetirizine (ZYRTEC) 10 MG tablet Take 10 mg by mouth daily.    Marland Kitchen diltiazem (CARTIA XT) 120 MG 24 hr capsule Take 1 capsule (120 mg total) by mouth daily. 30 capsule 9  . Magnesium 400 MG CAPS Take 400 mg by mouth daily.     . meloxicam (MOBIC) 15 MG tablet TAKE ONE TABLET BY MOUTH ONCE DAILY    . naproxen sodium (ANAPROX) 220 MG tablet Take by mouth.    Marland Kitchen omeprazole (PRILOSEC) 20 MG capsule Take 20 mg by mouth 2 (two) times daily before a meal.     . predniSONE (DELTASONE) 5 MG tablet TAKE ONE TABLET BY MOUTH ONCE DAILY WITH BREAKFAST 60 tablet 1  . Probiotic Product (ALIGN) 4 MG CAPS Take 4 mg by mouth daily.    . flecainide (TAMBOCOR) 100 MG tablet Take 3  tablets (300 mg total) by mouth as needed. Take 300mg  if heart rate reaches 140 beats per minute and monitor heart rate for 3 hours, if not returned to normal-go to [facility] ER (Patient not taking: Reported on 06/25/2016) 6 tablet 1  . predniSONE (DELTASONE) 10 MG tablet Take 10 mg by mouth daily with breakfast.  Current Facility-Administered Medications  Medication Dose Route Frequency Provider Last Rate Last Dose  . 0.9 %  sodium chloride infusion  500 mL Intravenous Continuous Ladene Artist, MD       Facility-Administered Medications Ordered in Other Encounters  Medication Dose Route Frequency Provider Last Rate Last Dose  . lidocaine (cardiac) 100 mg/57ml (XYLOCAINE) 20 MG/ML injection 2%    Anesthesia Intra-op Durwin Glaze Flowers, CRNA   60 mg at 03/25/13 1617  . propofol (DIPRIVAN) 10 mg/mL bolus/IV push    Anesthesia Intra-op Durwin Glaze Flowers, CRNA   90 mg at 03/25/13 1617    Physical Exam: Vitals:   06/25/16 1133  BP: (!) 144/70  Pulse: 79  Weight: 267 lb 9.6 oz (121.4 kg)  Height: 6\' 3"  (1.905 m)  BP rechecked at 148/80  GEN- The patient is well appearing, alert and oriented x 3 today.   Head- normocephalic, atraumatic Eyes-  Sclera clear, conjunctiva pink Ears- hearing intact Oropharynx- clear Lungs-   Normal work of breathing Heart- Regular rate and rhythm, no murmurs, rubs or gallops, PMI not laterally displaced. GI- soft, NT, ND, + BS Extremities- no clubbing, cyanosis, or edema  EKG- NSR with PAC, 79 bpm,pr int 182 ms, Qrs int 84 ms, qtc 440 ms Epic records reviewed   Assessment and Plan:  1. Afib/ atypical flutter Doing well s/p ablation wtihout recurrence I feel he had PC's by his description over the last few days, he is in SR Continue cardizem chads2vasc score is 2. Off xarelto per pt wishes Monitor is pending  2. ILD Continue f/u with  pulmonologist  3. HTN Good today but usually elevated, pt states improved at home  4.. Fatigue, shortness of  breath, progressive per pt He has been chronically short of breath for several years but he feels that dyspnea has worsened over the last few months and pulmonology feels that lung status is stable He does have suggested triple vessel CAD by Chest CT and dyspnea/fatigue could be anginal equivalents  Will cancel stress myoview and schedule for RHC/LHC pending 3/23. He will continue with plans for echo and monitor 3/27 Encouraged to continue with plans for sleep study with Dr. Elsworth Soho with poor sleep, daytime somnolence  Bmet/cbc today He was encouraged not to go to [facility] for business but stay local, pt deferred my advice  F/u in  afib clinic as needed F/u with Dr. Elsworth Soho as scheduled  Dr.Allred 5/14   Kenneth [name], [facility] [address] [place], [place] [address] [phone]

## 2016-06-28 NOTE — Progress Notes (Signed)
TR BAND REMOVAL  LOCATION:    right radial  DEFLATED PER PROTOCOL:    Yes.    TIME BAND OFF / DRESSING APPLIED:    1415   SITE UPON ARRIVAL:    Level 0  SITE AFTER BAND REMOVAL:    Level 0  CIRCULATION SENSATION AND MOVEMENT:    Within Normal Limits   Yes.    COMMENTS:   TOLERATED PROCEDURE WELL

## 2016-06-29 DIAGNOSIS — I484 Atypical atrial flutter: Secondary | ICD-10-CM | POA: Diagnosis not present

## 2016-06-29 DIAGNOSIS — J449 Chronic obstructive pulmonary disease, unspecified: Secondary | ICD-10-CM | POA: Diagnosis not present

## 2016-06-29 DIAGNOSIS — R0609 Other forms of dyspnea: Secondary | ICD-10-CM | POA: Diagnosis not present

## 2016-06-29 DIAGNOSIS — Z7901 Long term (current) use of anticoagulants: Secondary | ICD-10-CM

## 2016-06-29 DIAGNOSIS — I2729 Other secondary pulmonary hypertension: Secondary | ICD-10-CM | POA: Diagnosis not present

## 2016-06-29 DIAGNOSIS — G4733 Obstructive sleep apnea (adult) (pediatric): Secondary | ICD-10-CM | POA: Diagnosis not present

## 2016-06-29 DIAGNOSIS — I5032 Chronic diastolic (congestive) heart failure: Secondary | ICD-10-CM | POA: Diagnosis not present

## 2016-06-29 DIAGNOSIS — Z9861 Coronary angioplasty status: Secondary | ICD-10-CM

## 2016-06-29 DIAGNOSIS — E785 Hyperlipidemia, unspecified: Secondary | ICD-10-CM | POA: Diagnosis not present

## 2016-06-29 DIAGNOSIS — J849 Interstitial pulmonary disease, unspecified: Secondary | ICD-10-CM | POA: Diagnosis not present

## 2016-06-29 DIAGNOSIS — I2584 Coronary atherosclerosis due to calcified coronary lesion: Secondary | ICD-10-CM | POA: Diagnosis not present

## 2016-06-29 DIAGNOSIS — M1990 Unspecified osteoarthritis, unspecified site: Secondary | ICD-10-CM | POA: Diagnosis not present

## 2016-06-29 DIAGNOSIS — I110 Hypertensive heart disease with heart failure: Secondary | ICD-10-CM | POA: Diagnosis not present

## 2016-06-29 DIAGNOSIS — I2510 Atherosclerotic heart disease of native coronary artery without angina pectoris: Secondary | ICD-10-CM | POA: Diagnosis not present

## 2016-06-29 DIAGNOSIS — E119 Type 2 diabetes mellitus without complications: Secondary | ICD-10-CM | POA: Diagnosis not present

## 2016-06-29 LAB — BASIC METABOLIC PANEL
Anion gap: 8 (ref 5–15)
BUN: 11 mg/dL (ref 6–20)
CALCIUM: 8.5 mg/dL — AB (ref 8.9–10.3)
CO2: 23 mmol/L (ref 22–32)
CREATININE: 0.82 mg/dL (ref 0.61–1.24)
Chloride: 108 mmol/L (ref 101–111)
GFR calc non Af Amer: >60 mL/min (ref >60)
Glucose, Bld: 152 mg/dL — ABNORMAL HIGH (ref 65–99)
Potassium: 3.9 mmol/L (ref 3.5–5.1)
Sodium: 139 mmol/L (ref 135–145)

## 2016-06-29 LAB — CBC
HEMATOCRIT: 37.3 % — AB (ref 39.0–52.0)
HEMOGLOBIN: 12.2 g/dL — AB (ref 13.0–17.0)
MCH: 27.6 pg (ref 26.0–34.0)
MCHC: 32.7 g/dL (ref 30.0–36.0)
MCV: 84.4 fL (ref 78.0–100.0)
PLATELETS: 186 10*3/uL (ref 150–400)
RBC: 4.42 MIL/uL (ref 4.22–5.81)
RDW: 13.7 % (ref 11.5–15.5)
WBC: 7.2 10*3/uL (ref 4.0–10.5)

## 2016-06-29 LAB — GLUCOSE, CAPILLARY: Glucose-Capillary: 135 mg/dL — ABNORMAL HIGH (ref 65–99)

## 2016-06-29 MED ORDER — ASPIRIN 81 MG PO CHEW: 81.0000 mg | CHEWABLE_TABLET | Freq: Every day | ORAL | 6 refills | Status: DC

## 2016-06-29 MED ORDER — NITROGLYCERIN 0.4 MG SL SUBL: 0.4000 mg | SUBLINGUAL_TABLET | SUBLINGUAL | 12 refills | Status: DC | PRN

## 2016-06-29 MED ORDER — PANTOPRAZOLE SODIUM 40 MG PO TBEC: 40.0000 mg | DELAYED_RELEASE_TABLET | Freq: Every day | ORAL | 6 refills | Status: DC

## 2016-06-29 MED ORDER — CARVEDILOL 3.125 MG PO TABS
3.1250 mg | ORAL_TABLET | Freq: Two times a day (BID) | ORAL | Status: DC
  Administered 2016-06-29: 3.125 mg via ORAL
  Filled 2016-06-29: qty 1

## 2016-06-29 MED ORDER — CLOPIDOGREL BISULFATE 75 MG PO TABS: 75.0000 mg | ORAL_TABLET | Freq: Every day | ORAL | 6 refills | Status: DC

## 2016-06-29 MED ORDER — CARVEDILOL 3.125 MG PO TABS: 3.1250 mg | ORAL_TABLET | Freq: Two times a day (BID) | ORAL | 6 refills | Status: DC

## 2016-06-29 NOTE — Progress Notes (Signed)
Pt and wife given all discharge instructions and both verbalized understanding. Pt was given first dose of coreg prior to discharge. Pt and wife understands that prescriptions are to be picked up at [facility] today as well as the ntg sl tabs. No chest pain at discharge and pt was in SR. All belongings with pt.

## 2016-06-29 NOTE — Discharge Summary (Signed)
Discharge Summary    Patient ID: ALIF PETRAK,  MRN: [id number], DOB/AGE: March 14, 1946 71 y.o.  Admit date: 06/28/2016 Discharge date: 06/29/2016  Primary Care Provider: [facility] Primary Cardiologist: Dr. Stanford Breed (last seen 2013) EP: Dr. Rayann Heman  Discharge Diagnoses    Active Problems:   Chronic anticoagulation   Diastolic CHF, chronic (HCC)   Atypical atrial flutter (HCC)   ILD (interstitial lung disease) ([place])   Coronary artery calcification seen on CAT scan   Exertional dyspnea   CAD S/P percutaneous coronary angioplasty   Percutaneous transluminal coronary angioplasty (PTCA) within last 14 to 24 months   Allergies Allergies  Allergen Reactions  . Hydrocodone-Acetaminophen Shortness Of Breath  . Other Palpitations and Other (See Comments)    ALL DECONGESTANTS - CAUSE PALPITATIONS; THROWS HEART RHYTHM OUT OF BALANCE  . Oxycodone-Acetaminophen Shortness Of Breath  . Pseudoephedrine Palpitations  . Tramadol Shortness Of Breath and Rash    flushing    Diagnostic Studies/Procedures    Coronary Stent Intervention  Intravascular Pressure Wire/FFR Study  Right/Left Heart Cath and Coronary Angiography  Conclusion     Mid RCA lesion, 99 %stenosed.  A STENT SYNERGY DES 2.75X20 drug eluting stent was successfully placed.  Post intervention, there is a 0% residual stenosis.  Mid LAD lesion, 65 %stenosed. FFR performed => final FFR 0.75 (physiologically significant). Plan staged PCI  3rd Mrg lesion, 55 %stenosed.  Hemodynamic findings consistent with mild pulmonary hypertension. LV end diastolic pressure is moderately elevated.  The left ventricular systolic function is normal.  The left ventricular ejection fraction is 55-65% by visual estimate.   Mild secondary pulmonary hypertension  Severe 2 Vessel disease - 95% mRCA s/p PCI with DES; ~70% prox LAD with FFR 0.75  Codominant Cx with distal ~60% lesion prior to bifurcation into distal LPL  branches  Plan: Overnight monitoring post cath. He will need close monitoring of the right groin hematoma. Likely discharge tomorrow. We'll start scheduled PCI LAD 1st week of April (~April 3rd)- would plan same day d/c  Continue Plavix. Add Statin for d/c. Will likely need to d/c Flecainide given CAD Dx. - Defer to Afib Clinic; continue Diltiazem. Defer full anticoagulation to Afib Clinic.    History of Present Illness     71 y.o. male HLD, afib/atypical flutter s/p ablation in 01/2014 presented for scheduled cath.   He is off daily antiarrythmic and blood thinner. Has prn flecainide to use. He does have interstitial lung disease and is on daily prednisone. He is having close f/u with Dr. Elsworth Soho.   He asked to be seen 2/27,  after feeling a few extra beats and thought he may be in a afib/flutter. He states that he has been very winded with his activities and has noted increased fatigue. 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 He was very winded when he got to clinic today but he got lost in the hospital and had to walk a distance. His EKG showed SR at 88 bpm. His description of irregular heart beat does sound like he is having PC's vrs afib or flutter. He has not had to use flecainide since his ablation, per Dr. Jackalyn Lombard note 11/2014, he was off anticoagulation with chadsvasc score of 2.   F/u in afib clinic 3/20. He feels "terrible." States he is not sleeping, very fatigued and short of breath with exertion that he feels is progressive ov.er the  last few months. He feels that he is out of rhythm but EKG shows SR with PAC/sinus arrhythmia. He had a chest Ct in January that showed [place] that had progressed slightly in comparison to 2016. It also showed CAD involving the LAD, Circumflex and Rt coronary arteries as well as aortic atherosclerosis. He denies exertional chest pain. Scheduled for cath. Echo and monitor  3/27.   Hospital Course     Consultants: None  1. CAD  -Cath showed Severe 2 Vessel disease - 95% mRCA s/p PCI with DES; ~70% prox LAD with FFR 0.75 (plan for staged PCI on April 3rd). Codominant Cx with distal ~60% lesion prior to bifurcation into distal LPL branches. - Walking without chest pain or dyspnea. Continue ASA and plavix.  - [facility] however hasn't started yet. He will start taking (unknown dose) and will bring lipid panel labs for review during visit.   2. Afib/atypical flutter - Sinus rhythm with PACs. Intermittent SVT vs afib for very short runs. Continue Cardizem. Rate stable. Need to discontinue Flecainide given CAD. Defer full anticoagulation to Afib Clinic.  3. HTN - Elevated here. Started low dose BB.    Discharge Vitals Blood pressure (!) 141/63, pulse 78, temperature 97.8 F (36.6 C), temperature source Oral, resp. rate 18, height 6\' 3"  (1.905 m), weight 264 lb 4.8 oz (119.9 kg), SpO2 96 %.  Filed Weights   06/28/16 0551 06/29/16 0226  Weight: 266 lb (120.7 kg) 264 lb 4.8 oz (119.9 kg)    Labs & Radiologic Studies     CBC  Recent Labs  06/29/16 0605  WBC 7.2  HGB 12.2*  HCT 37.3*  MCV 84.4  PLT 951   Basic Metabolic Panel  Recent Labs  06/29/16 0605  NA 139  K 3.9  CL 108  CO2 23  GLUCOSE 152*  BUN 11  CREATININE 0.82  CALCIUM 8.5*   Liver Function Tests No results for input(s): AST, ALT, ALKPHOS, BILITOT, PROT, ALBUMIN in the last 72 hours. No results for input(s): LIPASE, AMYLASE in the last 72 hours. Cardiac Enzymes No results for input(s): CKTOTAL, CKMB, CKMBINDEX, TROPONINI in the last 72 hours. BNP Invalid input(s): POCBNP D-Dimer No results for input(s): DDIMER in the last 72 hours. Hemoglobin A1C No results for input(s): HGBA1C in the last 72 hours. Fasting Lipid Panel No results for input(s): CHOL, HDL, LDLCALC, TRIG, CHOLHDL, LDLDIRECT in the last 72 hours. Thyroid Function Tests No results for  input(s): TSH, T4TOTAL, T3FREE, THYROIDAB in the last 72 hours.  Invalid input(s): FREET3  No results found.  Disposition   Pt is being discharged home today in good condition.  Follow-up Plans & Appointments    Follow-up Information    [facility]. Schedule an appointment as soon as possible for a visit in 7 day(s).   Specialty:  Cardiology Contact information: [address] [id number] [place] [phone]         Discharge Instructions    Amb Referral to Cardiac Rehabilitation    Complete by:  As directed    Diagnosis:  Coronary Stents   Diet - low sodium heart healthy    Complete by:  As directed    Discharge instructions    Complete by:  As directed    No driving for 48 hours. No lifting over 5 lbs until next cath. No sexual activity for 1 week. Keep procedure site clean & dry. If you notice increased pain, swelling, bleeding  or pus, call/return!  You may shower, but no soaking baths/hot tubs/pools for 1 week.   Some studies suggest Prilosec/Omeprazole interacts with Plavix. We changed your Prilosec/Omeprazole to Protonix for less chance of interaction.   Increase activity slowly    Complete by:  As directed       Discharge Medications   Current Discharge Medication List    START taking these medications   Details  aspirin 81 MG chewable tablet Chew 1 tablet (81 mg total) by mouth daily. Qty: 30 tablet, Refills: 6    carvedilol (COREG) 3.125 MG tablet Take 1 tablet (3.125 mg total) by mouth 2 (two) times daily with a meal. Qty: 60 tablet, Refills: 6    clopidogrel (PLAVIX) 75 MG tablet Take 1 tablet (75 mg total) by mouth daily with breakfast. Qty: 30 tablet, Refills: 6    pantoprazole (PROTONIX) 40 MG tablet Take 1 tablet (40 mg total) by mouth daily. Qty: 30 tablet, Refills: 6      CONTINUE these medications which have NOT CHANGED   Details  acetaminophen (TYLENOL) 500 MG tablet Take  1,000 mg by mouth every 6 (six) hours as needed (for pain.).    Adalimumab 40 MG/0.8ML PNKT Inject 40 mg into the skin every 14 (fourteen) days. Fridays    Biotin 1000 MCG tablet Take 1,000 mcg by mouth at bedtime.    calcipotriene (DOVONOX) 0.005 % cream Apply 1 application topically 2 (two) times daily as needed (for psoriasis.).    cetirizine (ZYRTEC) 10 MG tablet Take 10 mg by mouth daily.    clobetasol cream (TEMOVATE) 5.46 % Apply 1 application topically 2 (two) times daily as needed (for psoriasis).    diltiazem (CARTIA XT) 120 MG 24 hr capsule Take 1 capsule (120 mg total) by mouth daily. Qty: 30 capsule, Refills: 9    Flurandrenolide (CORDRAN) 4 MCG/SQCM TAPE Apply 1 each topically at bedtime. Applied to areas affected by psoriasis.    Krill Oil 500 MG CAPS Take 500 mg by mouth at bedtime.    meloxicam (MOBIC) 15 MG tablet Take 15 mg by mouth daily as needed for pain.    !! Polyethyl Glycol-Propyl Glycol (SYSTANE OP) Apply 1 drop to eye daily.    !! Polyethyl Glycol-Propyl Glycol (SYSTANE) 0.4-0.3 % GEL ophthalmic gel Place 1 application into both eyes at bedtime.    predniSONE (DELTASONE) 5 MG tablet TAKE ONE TABLET BY MOUTH ONCE DAILY WITH BREAKFAST Qty: 60 tablet, Refills: 1    Probiotic Product ([place]) CAPS Take 1 capsule by mouth at bedtime.    zolpidem (AMBIEN) 10 MG tablet Take 5-10 mg by mouth at bedtime as needed for sleep.     !! - Potential duplicate medications found. Please discuss with provider.    STOP taking these medications     omeprazole (PRILOSEC) 20 MG capsule      flecainide (TAMBOCOR) 100 MG tablet            Outstanding Labs/Studies   Pending cath  Duration of Discharge Encounter   Greater than 30 minutes including physician time.  Signed, Efraim Vanallen PA-C 06/29/2016, 9:14 AM

## 2016-06-29 NOTE — Progress Notes (Signed)
Progress Note  Patient Name: Kenneth [name] Date of Encounter: 06/29/2016  Primary Cardiologist: Dr. Stanford Breed (last seen 2013) EP: Dr. Rayann Heman  Subjective   No further shortness of breath. No dyspnea or chest pain with walking.   Inpatient Medications    Scheduled Meds: . aspirin  81 mg Oral Daily  . clopidogrel  75 mg Oral Q breakfast  . diltiazem  120 mg Oral QPM  . insulin aspart  0-15 Units Subcutaneous TID WC  . insulin aspart  0-5 Units Subcutaneous QHS  . loratadine  10 mg Oral Daily  . pantoprazole  40 mg Oral Daily  . predniSONE  5 mg Oral Q breakfast  . sodium chloride flush  3 mL Intravenous Q12H   Continuous Infusions:  PRN Meds: sodium chloride, ondansetron (ZOFRAN) IV, polyvinyl alcohol, sodium chloride flush, zolpidem   Vital Signs    Vitals:   06/28/16 1600 06/28/16 1911 06/28/16 2300 06/29/16 0226  BP: 116/87 (!) 128/94  (!) 146/59  Pulse: 85 86  81  Resp: (!) 21 (!) 26 19 19   Temp: 97.7 F (36.5 C) 97.5 F (36.4 C)  97.7 F (36.5 C)  TempSrc: Oral Oral  Oral  SpO2: 96% 98%  95%  Weight:    264 lb 4.8 oz (119.9 kg)  Height:        Intake/Output Summary (Last 24 hours) at 06/29/16 0729 Last data filed at 06/29/16 6283  Gross per 24 hour  Intake          1416.83 ml  Output             3000 ml  Net         -1583.17 ml   Filed Weights   06/28/16 0551 06/29/16 0226  Weight: 266 lb (120.7 kg) 264 lb 4.8 oz (119.9 kg)    Telemetry    NSR with PACs (intermittent short runs of SVT 4-7 beats)  - Personally Reviewed  ECG    Sinus rhythm with PACs - Personally Reviewed  Physical Exam   GEN: No acute distress.   Neck: No JVD Cardiac: RRR, no murmurs, rubs, or gallops. R radial cath site without hematoma.  R groin cath site has ecchymosis.  Respiratory: Clear to auscultation bilaterally. GI: Soft, nontender, non-distended  MS: No edema; No deformity. Neuro:  Nonfocal  Psych: Normal affect   Labs    Chemistry Recent Labs Lab  06/25/16 1233 06/29/16 0605  NA 139 139  K 4.4 3.9  CL 107 108  CO2 23 23  GLUCOSE 196* 152*  BUN 11 11  CREATININE 1.00 0.82  CALCIUM 9.0 8.5*  GFRNONAA >60 >60  GFRAA >60 >60  ANIONGAP 9 8     Hematology Recent Labs Lab 06/25/16 1233 06/29/16 0605  WBC 6.4 7.2  RBC 4.85 4.42  HGB 13.7 12.2*  HCT 40.9 37.3*  MCV 84.3 84.4  MCH 28.2 27.6  MCHC 33.5 32.7  RDW 13.4 13.7  PLT 198 186    Cardiac EnzymesNo results for input(s): TROPONINI in the last 168 hours. No results for input(s): TROPIPOC in the last 168 hours.   BNPNo results for input(s): BNP, PROBNP in the last 168 hours.   DDimer No results for input(s): DDIMER in the last 168 hours.   Radiology    No results found.  Cardiac Studies   Coronary Stent Intervention  Intravascular Pressure Wire/FFR Study  Right/Left Heart Cath and Coronary Angiography  Conclusion     Mid RCA lesion, 99 %  stenosed.  A STENT SYNERGY DES 2.75X20 drug eluting stent was successfully placed.  Post intervention, there is a 0% residual stenosis.  Mid LAD lesion, 65 %stenosed. FFR performed => final FFR 0.75 (physiologically significant). Plan staged PCI  3rd Mrg lesion, 55 %stenosed.  Hemodynamic findings consistent with mild pulmonary hypertension. LV end diastolic pressure is moderately elevated.  The left ventricular systolic function is normal.  The left ventricular ejection fraction is 55-65% by visual estimate.    Mild secondary pulmonary hypertension  Severe 2 Vessel disease - 95% mRCA s/p PCI with DES; ~70% prox LAD with FFR 0.75  Codominant Cx with distal ~60% lesion prior to bifurcation into distal LPL branches  Plan: Overnight monitoring post cath. He will need close monitoring of the right groin hematoma. Likely discharge tomorrow. We'll start scheduled PCI LAD 1st week of April (~April 3rd)- would plan same day d/c  Continue Plavix. Add Statin for d/c. Will likely need to d/c Flecainide given CAD  Dx. - Defer to Afib Clinic; continue Diltiazem. Defer full anticoagulation to Afib Clinic.      Patient Profile     71 y.o. male HLD, afib/atypical flutter s/p ablation in 01/2014 presented for scheduled cath.   He is off daily antiarrythmic and blood thinner. Has prn flecainide to use. He does have interstitial lung disease and is on daily prednisone.  He is having close f/u with Dr. Elsworth Soho.   Assessment & Plan    1. CAD  -Cath showed Severe 2 Vessel disease - 95% mRCA s/p PCI with DES; ~70% prox LAD with FFR 0.75 (plan for staged PCI on April 3rd). Codominant Cx with distal ~60% lesion prior to bifurcation into distal LPL branches. - Walking without chest pain or dyspnea. Continue ASA and plavix.  - [facility] however hasn't started yet. He will start taking (unknown dose) and will bring lipid panel labs for review during visit.   2. Afib/atypical flutter - Sinus rhythm with PACs. Intermittent SVT vs afib for very short runs. Continue Cardizem. Rate stable. Need to discontinue Flecainide given CAD. Defer full anticoagulation to Afib Clinic.  3. HTN - Elevated here. He will keep long and bring to clinic for review. Not interested in medication currently.  Signed, Leanor Kail, PA  06/29/2016, 7:29 AM     The patient was seen, examined and discussed with Bhagat,Bhavinkumar PA-C and I agree with the above.   71 year old male with known parox a-fib/a-flutter, interstitial lung disease admitted with UA, cath showed 2-VD, s/p 95% mRCA s/p PCI with DES; ~70% prox LAD with FFR 0.75 (plan for staged PCI on April 3rd). Codominant Cx with distal ~60% lesion prior to bifurcation into distal LPL branches. He is asymptomatic this am, right radial insertion site looks well, great peripheral pulse. He is hypertensive with HR in 90', we will add a low dose carvedilol 3.125 mg po BID. Crea normal. Follow up in the a-fib clinic in 1 week.   Ena Dawley, MD 06/29/2016

## 2016-06-29 NOTE — Progress Notes (Signed)
CARDIAC REHAB PHASE I   PRE:  Rate/Rhythm: 90 sinus rhythm  BP:  Supine:   Sitting: 142/64  Standing:    SaO2: 96% ra   MODE:  Ambulation: 750  ft   POST:  Rate/Rhythem: 99 sinus rhythm  BP:  Supine:    Sitting: 175/65  Standing:    SaO2: 99% ra   Pt ambulated in hallway without difficulty.  steady gait.  Pt returned to chair, family and PA at bedside.  PA informed of post ambulation BP.  Education completed including risk factor modification, low fat-low cholesterol diet, exercise, and medication compliance.  Pt oriented to outpatient cardiac rehab.  Referral will be sent to [facility]. Pt has reservations about [facility] participation having not enjoyed pulmonary rehab in the past and due to his work schedule  in [place].  Pt encouraged to consideration participation for limited time, perhaps twice weekly on education days.   Understanding verbalized   [employer]

## 2016-06-30 LAB — HEMOGLOBIN A1C
HEMOGLOBIN A1C: 7.5 % — AB (ref 4.8–5.6)
Mean Plasma Glucose: 169 mg/dL

## 2016-07-01 ENCOUNTER — Other Ambulatory Visit: Payer: Self-pay | Admitting: *Deleted

## 2016-07-01 ENCOUNTER — Telehealth: Payer: Self-pay | Admitting: Cardiology

## 2016-07-01 ENCOUNTER — Telehealth (HOSPITAL_COMMUNITY): Payer: Self-pay

## 2016-07-01 ENCOUNTER — Encounter (HOSPITAL_COMMUNITY): Payer: Self-pay | Admitting: Cardiology

## 2016-07-01 DIAGNOSIS — I2510 Atherosclerotic heart disease of native coronary artery without angina pectoris: Secondary | ICD-10-CM

## 2016-07-01 DIAGNOSIS — Z01818 Encounter for other preprocedural examination: Secondary | ICD-10-CM

## 2016-07-01 DIAGNOSIS — Z9861 Coronary angioplasty status: Principal | ICD-10-CM

## 2016-07-01 NOTE — Telephone Encounter (Signed)
Patient has Medicare A/B. Insurance benefits active and verified. Passport/reference [phone]. Patient has [place], insurance benefits active and verified. BCBS supplement follows medicare guidelines. Passport/reference [phone]. Information given to [facility] for review.

## 2016-07-01 NOTE — Telephone Encounter (Signed)
New message    Pt wife is calling about scheduling a second stent with Dr. Ellyn Hack. She said they were told they could do a 530am appt and that's what they would like. She is requesting a call back about scheduling this.

## 2016-07-01 NOTE — Telephone Encounter (Signed)
SPOKE TO DR HARDING , PER VERBAL OREDER   SCHEDULE  PCI OF LAD FOR THE WEEK OF July 08 2016 - FIRST CASE.  LAB  ( CBC,CMP  PER DR HARDING) ORDER AT LEAST THE DAY BEFORE  PROCEDURE.   SPOKE TO PATIENT AND WIFE .  PCI-LAD SCHEDULE FOR  July 10 2016 AT 8:30 AM . PATIENT TO BE AT HOSPITAL  AT 6:30 AM . LABS ARE TO BE DONE ON April 2 ,2018 AT [facility] ( CMP , CBC ). PATIENT RECEIVED  INSTRUCTIONS IS AWARE OF  [facility].    PATIENT AND WIFE  VERBALIZED UNDERSTANDING FOR THE PATIENT   NOT TO EAT OR DRINK AFTER MIDNIGHT OF APRIL 3RD LEADING INTO THE 4TH.  TAKE ASPIRIN AND PLAVIX THE MORNING OF THE  PROCEDURE.

## 2016-07-02 ENCOUNTER — Other Ambulatory Visit (HOSPITAL_COMMUNITY): Payer: Medicare Other

## 2016-07-02 ENCOUNTER — Encounter (HOSPITAL_COMMUNITY): Payer: Medicare Other

## 2016-07-04 ENCOUNTER — Encounter (HOSPITAL_COMMUNITY): Payer: Self-pay | Admitting: Nurse Practitioner

## 2016-07-04 ENCOUNTER — Ambulatory Visit (HOSPITAL_COMMUNITY)
Admission: RE | Admit: 2016-07-04 | Discharge: 2016-07-04 | Disposition: A | Discharge status: Home / Self Care | Payer: Medicare Other | Source: Ambulatory Visit | Attending: Nurse Practitioner | Admitting: Nurse Practitioner

## 2016-07-04 VITALS — BP 146/82 | HR 77 | Wt 262.5 lb

## 2016-07-04 DIAGNOSIS — Z955 Presence of coronary angioplasty implant and graft: Secondary | ICD-10-CM | POA: Diagnosis not present

## 2016-07-04 DIAGNOSIS — Z9861 Coronary angioplasty status: Secondary | ICD-10-CM | POA: Diagnosis not present

## 2016-07-04 DIAGNOSIS — E119 Type 2 diabetes mellitus without complications: Secondary | ICD-10-CM | POA: Diagnosis not present

## 2016-07-04 DIAGNOSIS — I4891 Unspecified atrial fibrillation: Secondary | ICD-10-CM | POA: Diagnosis not present

## 2016-07-04 DIAGNOSIS — J849 Interstitial pulmonary disease, unspecified: Secondary | ICD-10-CM | POA: Diagnosis not present

## 2016-07-04 DIAGNOSIS — I484 Atypical atrial flutter: Secondary | ICD-10-CM | POA: Insufficient documentation

## 2016-07-04 DIAGNOSIS — E785 Hyperlipidemia, unspecified: Secondary | ICD-10-CM | POA: Insufficient documentation

## 2016-07-04 DIAGNOSIS — Z7952 Long term (current) use of systemic steroids: Secondary | ICD-10-CM | POA: Diagnosis not present

## 2016-07-04 DIAGNOSIS — Z7982 Long term (current) use of aspirin: Secondary | ICD-10-CM | POA: Diagnosis not present

## 2016-07-04 DIAGNOSIS — I2510 Atherosclerotic heart disease of native coronary artery without angina pectoris: Secondary | ICD-10-CM | POA: Insufficient documentation

## 2016-07-04 DIAGNOSIS — Z7902 Long term (current) use of antithrombotics/antiplatelets: Secondary | ICD-10-CM | POA: Diagnosis not present

## 2016-07-04 DIAGNOSIS — K219 Gastro-esophageal reflux disease without esophagitis: Secondary | ICD-10-CM | POA: Diagnosis not present

## 2016-07-04 DIAGNOSIS — I10 Essential (primary) hypertension: Secondary | ICD-10-CM | POA: Insufficient documentation

## 2016-07-04 DIAGNOSIS — L409 Psoriasis, unspecified: Secondary | ICD-10-CM | POA: Insufficient documentation

## 2016-07-04 DIAGNOSIS — Z79899 Other long term (current) drug therapy: Secondary | ICD-10-CM | POA: Insufficient documentation

## 2016-07-04 DIAGNOSIS — Z7984 Long term (current) use of oral hypoglycemic drugs: Secondary | ICD-10-CM | POA: Diagnosis not present

## 2016-07-04 DIAGNOSIS — Z85828 Personal history of other malignant neoplasm of skin: Secondary | ICD-10-CM | POA: Insufficient documentation

## 2016-07-04 DIAGNOSIS — G4730 Sleep apnea, unspecified: Secondary | ICD-10-CM | POA: Insufficient documentation

## 2016-07-04 DIAGNOSIS — Z87442 Personal history of urinary calculi: Secondary | ICD-10-CM | POA: Insufficient documentation

## 2016-07-04 MED ORDER — METFORMIN HCL ER (MOD) 500 MG PO TB24: 500.0000 mg | ORAL_TABLET | Freq: Every day | ORAL | 3 refills | Status: DC

## 2016-07-04 NOTE — Progress Notes (Signed)
262lbs 8oz

## 2016-07-04 NOTE — Progress Notes (Signed)
Patient ID: Kenneth [name], male   DOB: 30-Dec-1945, 71 y.o.   MRN: [id number]       PCP: [facility]  Pulmonologist: Dr. Elsworth Soho Cardiologist: Dr. Westley Foots is a 71 y.o. male who presents today in [place] clinic s/p afib/flutter ablation 10/15. He reports doing well from a heart standpoint without any awareness of sustatined irregular heartbeat. He is off daily antiarrythmic and blood thinner. Has prn flecainide to use. He does have interstitial lung disease and is on daily prednisone.  Had a flair recently and had to increase prednisone to 10 mg daily. Unfortunately, he has not been able to reduce back to 5 mg without being symptomatic. He is having close f/u with Dr. Elsworth Soho. 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   He asked to be seen 2/27,  after feeling a few extra beats and thought he may be in a afib/flutter. He states that he has been very winded with his activities and has noted increased fatigue. 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 He was very winded when he got to clinic today but he got lost in the hospital and had to walk a distance. His EKG showed SR at 88 bpm. His description of irregular heart beat does sound like he is having PC's vrs afib or flutter. He has not had to use flecainide since his ablation, per Dr. Jackalyn Lombard note 11/2014, he was off anticoagulation with chadsvasc score of 2. He continues to see Dr. Elsworth Soho for ILD. He was reassured that his heart rhythm was SR and I thought he was having prematuire contractions. He was set up for an stress myoview, echo and monitor which is pending 3/27.  F/u in afib clinic 3/20. He feels "terrible." States he is not sleeping, very fatigued and short of breath with exertion that he feels is progressive over the last few months. He feels that he is out of rhythm but EKG shows SR with PAC/sinus arrhythmia. His sister just died unexpectedly  from the flu which is heavy on his mind.. He has to go on business to [place] over the next few days and he wants to know if he will live to come back.  He had f/u with Dr. Elsworth Soho and his lung status was thought to be stable with known ILD. However, he had OSA in the past thru the [place] that he never treated and Dr. Elsworth Soho wants to order another test. He states that he will fall asleep and then wake up short of breath.  He sleeps very poorly and is tired on awakening.  He had a chest Ct in January that showed [place] that had progressed slightly in comparison to 2016. It also showed CAD involving the LAD, Circumflex and Rt coronary arteries as well as aortic atherosclerosis. He denies exertional chest pain.   F/u from cardiac catherization performed 3/23 for progressive shortness of breath and 3v CAD seen on chest CT. He did receive a DES of RCA and is staged to have a stent placed of the LAD 4/4. He is breathing much better. He was asked to return here to decide if he needs to be on triple therapy(chadsvas score is now 4) and brief runs noted on monitor of SVT vrs afib. I discussed with Dr. Rayann Heman and he said if no sustained afib/flutter was seen/documented that he would hold off for now due to risk of bleeding  with triple therapy. It was discussed with pt being noncompliant with meds for  BP,DM HLD. He has restarted pravastatin and metformin  and is pending an appointment with PCP at [facility] soon. He was stressed that these issues need to be well managed to prevent progression of his CAD.   Today, he denies symptoms of palpitations,no presyncope, or syncope. Positive for fatigue, progressive dyspnea. exertional dyspnea   The patient is otherwise without complaint today as for above.  Past Medical History:  Diagnosis Date  . Adenomatous polyp of colon 2006  . Anemia   . Arthritis    "qwhere" (06/28/2016)  . Atypical chest pain    a. 06/2003 Cath:  nl cors;  b. 11/2006 MV: no ischemia, nl LV  . Coronary artery disease     . Diabetes mellitus without complication ([place])    "suppose to be on Metformin but doesn't take it" (06/28/2016)  . Diverticulosis of colon (without mention of hemorrhage) 1999, 2006  . Dyspepsia   . Epiretinal membrane    "epiretinal attachment" (06/28/2016)  . Esophagitis 1999  . GERD (gastroesophageal reflux disease)   . Heart murmur   . Hiatal hernia 1999  . History of kidney stones   . HOH (hard of hearing)    "wears aids"   . Hyperlipidemia   . Hypertension   . 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 Paroxysmal atrial fibrillation ([place])    a. 11/2003 Tikosyn initiated - subsequently d/c'd;  b. 2006 RFCA for Afib @ [facility];  c. 09/2011 Echo: EF 60-65%, Gr 2 DD;  d. 10/2011 DCCV  . Pneumonia 1990s X 1  . Psoriasis 1967  . Rheumatic fever ~ 1958  . Seasonal allergies   . Skin cancer    on leg, left arm  . Sleep apnea    patient has had the sleep test, but states they said it didn't need treatment; "will repeat test at home" (06/28/2016)   Past Surgical History:  Procedure Laterality Date  . APPENDECTOMY  1996  . ATRIAL FIBRILLATION ABLATION N/A 01/13/2014   repeat PVI, also left atrial ablation performed with successfull ablation of LA flutter by Dr Rayann Heman  . ATRIAL FIBRILLATION ABLATION  2006   Dr Rolland Porter at [facility]  . CARDIAC CATHETERIZATION  2005  . CARDIOVERSION N/A 05/22/2013   Procedure: CARDIOVERSION;  Surgeon: [name], MD;  Location: [facility];  Service: Cardiovascular;  Laterality: N/A;  . CARDIOVERSION N/A 10/23/2011   Procedure: CARDIOVERSION;  Surgeon: Deboraha Sprang, MD;  Location: [facility] CATH LAB;  Service: Cardiovascular;  Laterality: N/A;  . CARDIOVERSION N/A 05/01/2012   Procedure: CARDIOVERSION;  Surgeon: Deboraha Sprang, MD;  Location: [facility] CATH LAB;  Service: Cardiovascular;  Laterality: N/A;  . CARPAL TUNNEL RELEASE Right 1980s?  Marland Kitchen CATARACT EXTRACTION W/ INTRAOCULAR LENS  IMPLANT, BILATERAL Bilateral   . COLONOSCOPY    . CORONARY  ANGIOPLASTY WITH STENT PLACEMENT  06/28/2016  . CORONARY STENT INTERVENTION N/A 06/28/2016   Procedure: Coronary Stent Intervention;  Surgeon: Leonie Man, MD;  Location: [facility] CV LAB;  Service: Cardiovascular;  Laterality: N/A;  . CYSTOSCOPY W/ STONE MANIPULATION  2010   Patient states he has had several kidney stones up until aroune 2010, none since (06/28/2016)  . ESOPHAGOGASTRODUODENOSCOPY    . INTRAVASCULAR PRESSURE WIRE/FFR STUDY N/A 06/28/2016   Procedure: Intravascular Pressure Wire/FFR Study;  Surgeon: Leonie Man, MD;  Location: [facility] CV LAB;  Service:  Cardiovascular;  Laterality: N/A;  . NASAL SINUS SURGERY     x 2  . RIGHT/LEFT HEART CATH AND CORONARY ANGIOGRAPHY N/A 06/28/2016   Procedure: Right/Left Heart Cath and Coronary Angiography;  Surgeon: Leonie Man, MD;  Location: [facility] CV LAB;  Service: Cardiovascular;  Laterality: N/A;  . SHOULDER OPEN ROTATOR CUFF REPAIR Bilateral   . TONSILLECTOMY      ROS- all systems are reviewed and negatives except as per HPI above  Current Outpatient Prescriptions  Medication Sig Dispense Refill  . acetaminophen (TYLENOL) 500 MG tablet Take 1,000 mg by mouth every 6 (six) hours as needed (for pain.).    . Adalimumab 40 MG/0.8ML PNKT Inject 40 mg into the skin every 14 (fourteen) days. Fridays    . aspirin 81 MG chewable tablet Chew 1 tablet (81 mg total) by mouth daily. 30 tablet 6  . Biotin 1000 MCG tablet Take 1,000 mcg by mouth at bedtime.    . calcipotriene (DOVONOX) 0.005 % cream Apply 1 application topically 2 (two) times daily as needed (for psoriasis.).    Marland Kitchen carvedilol (COREG) 3.125 MG tablet Take 1 tablet (3.125 mg total) by mouth 2 (two) times daily with a meal. 60 tablet 6  . cetirizine (ZYRTEC) 10 MG tablet Take 10 mg by mouth daily.    . clobetasol cream (TEMOVATE) 4.31 % Apply 1 application topically 2 (two) times daily as needed (for psoriasis).    . clopidogrel (PLAVIX) 75 MG tablet Take 1 tablet  (75 mg total) by mouth daily with breakfast. 30 tablet 6  . diltiazem (CARTIA XT) 120 MG 24 hr capsule Take 1 capsule (120 mg total) by mouth daily. (Patient taking differently: Take 120 mg by mouth every evening. ) 30 capsule 9  . Flurandrenolide (CORDRAN) 4 MCG/SQCM TAPE Apply 1 each topically at bedtime. Applied to areas affected by psoriasis.    Javier Docker Oil 500 MG CAPS Take 500 mg by mouth at bedtime.    . meloxicam (MOBIC) 15 MG tablet Take 15 mg by mouth daily as needed for pain.    . pantoprazole (PROTONIX) 40 MG tablet Take 1 tablet (40 mg total) by mouth daily. 30 tablet 6  . Polyethyl Glycol-Propyl Glycol (SYSTANE OP) Apply 1 drop to eye daily.    Vladimir Faster Glycol-Propyl Glycol (SYSTANE) 0.4-0.3 % GEL ophthalmic gel Place 1 application into both eyes at bedtime.    . predniSONE (DELTASONE) 5 MG tablet TAKE ONE TABLET BY MOUTH ONCE DAILY WITH BREAKFAST 60 tablet 1  . Probiotic Product ([place]) CAPS Take 1 capsule by mouth at bedtime.    Marland Kitchen zolpidem (AMBIEN) 10 MG tablet Take 5-10 mg by mouth at bedtime as needed for sleep.    . metFORMIN (GLUMETZA) 500 MG (MOD) 24 hr tablet Take 1 tablet (500 mg total) by mouth daily with breakfast. 30 tablet 3  . nitroGLYCERIN (NITROSTAT) 0.4 MG SL tablet Place 1 tablet (0.4 mg total) under the tongue every 5 (five) minutes as needed for chest pain. (Patient not taking: Reported on 07/04/2016) 25 tablet 12   Current Facility-Administered Medications  Medication Dose Route Frequency Provider Last Rate Last Dose  . 0.9 %  sodium chloride infusion  500 mL Intravenous Continuous Ladene Artist, MD       Facility-Administered Medications Ordered in Other Encounters  Medication Dose Route Frequency Provider Last Rate Last Dose  . lidocaine (cardiac) 100 mg/54ml (XYLOCAINE) 20 MG/ML injection 2%    Anesthesia Intra-op Rokoshi T  Flowers, CRNA   60 mg at 03/25/13 1617  . propofol (DIPRIVAN) 10 mg/mL bolus/IV push    Anesthesia Intra-op Durwin Glaze  Flowers, CRNA   90 mg at 03/25/13 1617    Physical Exam: Vitals:   07/04/16 1031  BP: (!) 146/82  Pulse: 77  SpO2: 95%  Weight: 262 lb 8 oz (119.1 kg)  BP rechecked at 148/80  GEN- The patient is well appearing, alert and oriented x 3 today.   Head- normocephalic, atraumatic Eyes-  Sclera clear, conjunctiva pink Ears- hearing intact Oropharynx- clear Lungs-   Normal work of breathing Heart- Regular rate and rhythm, no murmurs, rubs or gallops, PMI not laterally displaced. GI- soft, NT, ND, + BS Extremities- no clubbing, cyanosis, or edema  EKG- NSR, normal EKG, 78 bpm,pr int 174 ms, Qrs int  7ms, qtc 421 ms Epic records reviewed Cardiac catherization-3/23Conclusion     Mid RCA lesion, 99 %stenosed.  A STENT SYNERGY DES 2.75X20 drug eluting stent was successfully placed.  Post intervention, there is a 0% residual stenosis.  Mid LAD lesion, 65 %stenosed. FFR performed => final FFR 0.75 (physiologically significant). Plan staged PCI  3rd Mrg lesion, 55 %stenosed.  Hemodynamic findings consistent with mild pulmonary hypertension. LV end diastolic pressure is moderately elevated.  The left ventricular systolic function is normal.  The left ventricular ejection fraction is 55-65% by visual estimate.    Mild secondary pulmonary hypertension  Severe 2 Vessel disease - 95% mRCA s/p PCI with DES; ~70% prox LAD with FFR 0.75  Codominant Cx with distal ~60% lesion prior to bifurcation into distal LPL branches  Plan: Overnight monitoring post cath. He will need close monitoring of the right groin hematoma. Likely discharge tomorrow. We'll start scheduled PCI LAD 1st week of April (~April 3rd)- would plan same day d/c  Continue Plavix. Add Statin for d/c. Will likely need to d/c Flecainide given CAD Dx. - Defer to Afib Clinic; continue Diltiazem. Defer full anticoagulation to       Assessment and Plan:  1. Afib/ atypical flutter Doing well s/p ablation wtihout  recurrence Few short bursts of SVT vrs afib noted in hospital Pt was not taking DOAC in past per his wishes For now hold off on triple therapy with adding back in [place], but if afib/flutter becomes prevalent will need to restart Continue cardizem chads2vasc score is 4  2. ILD Continue f/u with  pulmonologist  3. HTN Acceptable but not optimal F/u with PCP is pending  4. DM Pt has stopped metformin some time ago He has restarted   5. Hyperlipidemia Has restarted pravastatin Has not taken in some time Will need lipid profile in near future  6. CAD New DES stent to RCA Staged LAD stent pending 4/4 Continue asa/plavix Asked to not take meloxicam No longer able to take flecainide, with new dx of CAD, however he has not taken since last ablation   f/u with Dr. Ellyn Hack as scheduled  F/u with Dr. Rayann Heman as scheduled  F/u with Dr. Elsworth Soho as scheduled   Geroge Baseman. [place], [facility] [address] [place], [place] [address] [phone]

## 2016-07-08 ENCOUNTER — Other Ambulatory Visit (HOSPITAL_COMMUNITY): Payer: Self-pay | Admitting: *Deleted

## 2016-07-08 DIAGNOSIS — Z01818 Encounter for other preprocedural examination: Secondary | ICD-10-CM | POA: Diagnosis not present

## 2016-07-08 DIAGNOSIS — I2510 Atherosclerotic heart disease of native coronary artery without angina pectoris: Secondary | ICD-10-CM | POA: Diagnosis not present

## 2016-07-08 DIAGNOSIS — Z9861 Coronary angioplasty status: Secondary | ICD-10-CM | POA: Diagnosis not present

## 2016-07-08 LAB — CBC
Hematocrit: 38.3 % (ref 37.5–51.0)
Hemoglobin: 13.1 g/dL (ref 13.0–17.7)
MCH: 28.6 pg (ref 26.6–33.0)
MCHC: 34.2 g/dL (ref 31.5–35.7)
MCV: 84 fL (ref 79–97)
Platelets: 261 x10E3/uL (ref 150–379)
RBC: 4.58 x10E6/uL (ref 4.14–5.80)
RDW: 15.7 % — ABNORMAL HIGH (ref 12.3–15.4)
WBC: 8.9 x10E3/uL (ref 3.4–10.8)

## 2016-07-08 LAB — COMPREHENSIVE METABOLIC PANEL WITH GFR
ALT: 60 IU/L — ABNORMAL HIGH (ref 0–44)
AST: 38 IU/L (ref 0–40)
Albumin/Globulin Ratio: 1.8 (ref 1.2–2.2)
Albumin: 4.7 g/dL (ref 3.5–4.8)
Alkaline Phosphatase: 82 IU/L (ref 39–117)
BUN/Creatinine Ratio: 19 (ref 10–24)
BUN: 20 mg/dL (ref 8–27)
Bilirubin Total: 0.5 mg/dL (ref 0.0–1.2)
CO2: 29 mmol/L (ref 18–29)
Calcium: 9.7 mg/dL (ref 8.6–10.2)
Chloride: 99 mmol/L (ref 96–106)
Creatinine, Ser: 1.07 mg/dL (ref 0.76–1.27)
GFR calc Af Amer: 81 mL/min/1.73 (ref >59)
GFR calc non Af Amer: 70 mL/min/1.73 (ref >59)
Globulin, Total: 2.6 g/dL (ref 1.5–4.5)
Glucose: 129 mg/dL — ABNORMAL HIGH (ref 65–99)
Potassium: 4.8 mmol/L (ref 3.5–5.2)
Sodium: 135 mmol/L (ref 134–144)
Total Protein: 7.3 g/dL (ref 6.0–8.5)

## 2016-07-10 ENCOUNTER — Encounter (HOSPITAL_COMMUNITY): Payer: Self-pay | Admitting: Cardiology

## 2016-07-10 ENCOUNTER — Ambulatory Visit (HOSPITAL_COMMUNITY)
Admission: RE | Disposition: A | Discharge status: Home / Self Care | Payer: Self-pay | Source: Ambulatory Visit | Attending: Cardiology

## 2016-07-10 ENCOUNTER — Ambulatory Visit (HOSPITAL_COMMUNITY)
Admission: RE | Admit: 2016-07-10 | Discharge: 2016-07-10 | Disposition: A | Discharge status: Home / Self Care | Payer: Medicare Other | Source: Ambulatory Visit | Attending: Cardiology | Admitting: Cardiology

## 2016-07-10 DIAGNOSIS — I272 Pulmonary hypertension, unspecified: Secondary | ICD-10-CM | POA: Diagnosis not present

## 2016-07-10 DIAGNOSIS — Z7902 Long term (current) use of antithrombotics/antiplatelets: Secondary | ICD-10-CM | POA: Insufficient documentation

## 2016-07-10 DIAGNOSIS — I110 Hypertensive heart disease with heart failure: Secondary | ICD-10-CM | POA: Insufficient documentation

## 2016-07-10 DIAGNOSIS — Z7982 Long term (current) use of aspirin: Secondary | ICD-10-CM | POA: Insufficient documentation

## 2016-07-10 DIAGNOSIS — Z7952 Long term (current) use of systemic steroids: Secondary | ICD-10-CM | POA: Insufficient documentation

## 2016-07-10 DIAGNOSIS — Z955 Presence of coronary angioplasty implant and graft: Secondary | ICD-10-CM | POA: Diagnosis not present

## 2016-07-10 DIAGNOSIS — I484 Atypical atrial flutter: Secondary | ICD-10-CM | POA: Insufficient documentation

## 2016-07-10 DIAGNOSIS — R0609 Other forms of dyspnea: Secondary | ICD-10-CM

## 2016-07-10 DIAGNOSIS — E119 Type 2 diabetes mellitus without complications: Secondary | ICD-10-CM | POA: Insufficient documentation

## 2016-07-10 DIAGNOSIS — Z9861 Coronary angioplasty status: Secondary | ICD-10-CM

## 2016-07-10 DIAGNOSIS — G4733 Obstructive sleep apnea (adult) (pediatric): Secondary | ICD-10-CM | POA: Insufficient documentation

## 2016-07-10 DIAGNOSIS — E785 Hyperlipidemia, unspecified: Secondary | ICD-10-CM | POA: Insufficient documentation

## 2016-07-10 DIAGNOSIS — M1990 Unspecified osteoarthritis, unspecified site: Secondary | ICD-10-CM | POA: Insufficient documentation

## 2016-07-10 DIAGNOSIS — I5032 Chronic diastolic (congestive) heart failure: Secondary | ICD-10-CM | POA: Diagnosis not present

## 2016-07-10 DIAGNOSIS — I471 Supraventricular tachycardia: Secondary | ICD-10-CM | POA: Insufficient documentation

## 2016-07-10 DIAGNOSIS — Z7984 Long term (current) use of oral hypoglycemic drugs: Secondary | ICD-10-CM | POA: Insufficient documentation

## 2016-07-10 DIAGNOSIS — I25118 Atherosclerotic heart disease of native coronary artery with other forms of angina pectoris: Secondary | ICD-10-CM | POA: Diagnosis not present

## 2016-07-10 DIAGNOSIS — K219 Gastro-esophageal reflux disease without esophagitis: Secondary | ICD-10-CM | POA: Insufficient documentation

## 2016-07-10 DIAGNOSIS — D649 Anemia, unspecified: Secondary | ICD-10-CM | POA: Insufficient documentation

## 2016-07-10 DIAGNOSIS — Z01818 Encounter for other preprocedural examination: Secondary | ICD-10-CM

## 2016-07-10 DIAGNOSIS — I2510 Atherosclerotic heart disease of native coronary artery without angina pectoris: Secondary | ICD-10-CM | POA: Diagnosis present

## 2016-07-10 DIAGNOSIS — I25119 Atherosclerotic heart disease of native coronary artery with unspecified angina pectoris: Secondary | ICD-10-CM | POA: Diagnosis not present

## 2016-07-10 DIAGNOSIS — J849 Interstitial pulmonary disease, unspecified: Secondary | ICD-10-CM | POA: Insufficient documentation

## 2016-07-10 HISTORY — PX: CORONARY STENT INTERVENTION: CATH118234

## 2016-07-10 LAB — POCT ACTIVATED CLOTTING TIME
ACTIVATED CLOTTING TIME: 219 s
Activated Clotting Time: 241 seconds
Activated Clotting Time: 274 seconds

## 2016-07-10 LAB — GLUCOSE, CAPILLARY
Glucose-Capillary: 111 mg/dL — ABNORMAL HIGH (ref 65–99)
Glucose-Capillary: 131 mg/dL — ABNORMAL HIGH (ref 65–99)

## 2016-07-10 SURGERY — CORONARY STENT INTERVENTION
Anesthesia: LOCAL

## 2016-07-10 MED ORDER — ONDANSETRON HCL 4 MG/2ML IJ SOLN: 4.0000 mg | Freq: Four times a day (QID) | INTRAMUSCULAR | Status: DC | PRN

## 2016-07-10 MED ORDER — LIDOCAINE HCL (PF) 1 % IJ SOLN
INTRAMUSCULAR | Status: AC
  Filled 2016-07-10: qty 30

## 2016-07-10 MED ORDER — FENTANYL CITRATE (PF) 100 MCG/2ML IJ SOLN
INTRAMUSCULAR | Status: AC
  Filled 2016-07-10: qty 2

## 2016-07-10 MED ORDER — ASPIRIN 81 MG PO CHEW
CHEWABLE_TABLET | ORAL | Status: AC
  Administered 2016-07-10: 81 mg
  Filled 2016-07-10: qty 1

## 2016-07-10 MED ORDER — SODIUM CHLORIDE 0.9 % IV SOLN: 250.0000 mL | INTRAVENOUS | Status: DC | PRN

## 2016-07-10 MED ORDER — MIDAZOLAM HCL 2 MG/2ML IJ SOLN
INTRAMUSCULAR | Status: AC
  Filled 2016-07-10: qty 2

## 2016-07-10 MED ORDER — ANGIOPLASTY BOOK
Freq: Once | Status: DC
  Filled 2016-07-10: qty 1

## 2016-07-10 MED ORDER — NITROGLYCERIN 1 MG/10 ML FOR IR/CATH LAB
INTRA_ARTERIAL | Status: AC
  Filled 2016-07-10: qty 10

## 2016-07-10 MED ORDER — VERAPAMIL HCL 2.5 MG/ML IV SOLN
INTRAVENOUS | Status: DC | PRN
  Administered 2016-07-10: 10 mL via INTRA_ARTERIAL

## 2016-07-10 MED ORDER — HEPARIN (PORCINE) IN NACL 2-0.9 UNIT/ML-% IJ SOLN
INTRAMUSCULAR | Status: DC | PRN
  Administered 2016-07-10: 1000 mL

## 2016-07-10 MED ORDER — HEPARIN (PORCINE) IN NACL 2-0.9 UNIT/ML-% IJ SOLN
INTRAMUSCULAR | Status: AC
  Filled 2016-07-10: qty 1000

## 2016-07-10 MED ORDER — MIDAZOLAM HCL 2 MG/2ML IJ SOLN
INTRAMUSCULAR | Status: DC | PRN
  Administered 2016-07-10: 2 mg via INTRAVENOUS

## 2016-07-10 MED ORDER — VERAPAMIL HCL 2.5 MG/ML IV SOLN
INTRAVENOUS | Status: AC
  Filled 2016-07-10: qty 2

## 2016-07-10 MED ORDER — FENTANYL CITRATE (PF) 100 MCG/2ML IJ SOLN
INTRAMUSCULAR | Status: DC | PRN
  Administered 2016-07-10: 25 ug via INTRAVENOUS

## 2016-07-10 MED ORDER — HEPARIN SODIUM (PORCINE) 1000 UNIT/ML IJ SOLN
INTRAMUSCULAR | Status: DC | PRN
  Administered 2016-07-10: 8000 [IU] via INTRAVENOUS
  Administered 2016-07-10 (×2): 2000 [IU] via INTRAVENOUS

## 2016-07-10 MED ORDER — IOPAMIDOL (ISOVUE-370) INJECTION 76%
INTRAVENOUS | Status: AC
  Filled 2016-07-10: qty 200

## 2016-07-10 MED ORDER — LIDOCAINE HCL (PF) 1 % IJ SOLN
INTRAMUSCULAR | Status: DC | PRN
  Administered 2016-07-10: 2 mL via INTRADERMAL

## 2016-07-10 MED ORDER — SODIUM CHLORIDE 0.9% FLUSH: 3.0000 mL | INTRAVENOUS | Status: DC | PRN

## 2016-07-10 MED ORDER — HEPARIN SODIUM (PORCINE) 1000 UNIT/ML IJ SOLN
INTRAMUSCULAR | Status: AC
  Filled 2016-07-10: qty 1

## 2016-07-10 MED ORDER — SODIUM CHLORIDE 0.9 % WEIGHT BASED INFUSION: 1.0000 mL/kg/h | INTRAVENOUS | Status: AC

## 2016-07-10 MED ORDER — SODIUM CHLORIDE 0.9 % IV SOLN
INTRAVENOUS | Status: DC
  Administered 2016-07-10: 07:00:00 via INTRAVENOUS

## 2016-07-10 MED ORDER — IOPAMIDOL (ISOVUE-370) INJECTION 76%
INTRAVENOUS | Status: AC
  Filled 2016-07-10: qty 100

## 2016-07-10 MED ORDER — SODIUM CHLORIDE 0.9% FLUSH: 3.0000 mL | Freq: Two times a day (BID) | INTRAVENOUS | Status: DC

## 2016-07-10 MED ORDER — IOPAMIDOL (ISOVUE-370) INJECTION 76%
INTRAVENOUS | Status: DC | PRN
  Administered 2016-07-10: 125 mL via INTRA_ARTERIAL

## 2016-07-10 SURGICAL SUPPLY — 16 items
BALLN MOZEC 2.50X20 (BALLOONS) ×2
BALLN ~~LOC~~ MOZEC 3.5X15 (BALLOONS) ×2
BALLOON MOZEC 2.50X20 (BALLOONS) IMPLANT
BALLOON ~~LOC~~ MOZEC 3.5X15 (BALLOONS) IMPLANT
CATH VISTA GUIDE 6FR XBLAD3.5 (CATHETERS) ×1 IMPLANT
DEVICE RAD COMP TR BAND LRG (VASCULAR PRODUCTS) ×1 IMPLANT
GLIDESHEATH SLEND A-KIT 6F 22G (SHEATH) ×1 IMPLANT
GUIDEWIRE INQWIRE 1.5J.035X260 (WIRE) IMPLANT
INQWIRE 1.5J .035X260CM (WIRE) ×2
KIT ENCORE 26 ADVANTAGE (KITS) ×3 IMPLANT
KIT HEART LEFT (KITS) ×2 IMPLANT
PACK CARDIAC CATHETERIZATION (CUSTOM PROCEDURE TRAY) ×2 IMPLANT
STENT SYNERGY DES 3.5X20 (Permanent Stent) ×1 IMPLANT
TRANSDUCER W/STOPCOCK (MISCELLANEOUS) ×2 IMPLANT
TUBING CIL FLEX 10 FLL-RA (TUBING) ×2 IMPLANT
WIRE MARVEL STR TIP 190CM (WIRE) ×1 IMPLANT

## 2016-07-10 NOTE — Discharge Summary (Signed)
Discharge Summary    Patient ID: Kenneth [name],  MRN: [id number], DOB/AGE: 12-17-1945 71 y.o.  Admit date: 07/10/2016 Discharge date: 07/10/2016  Primary Care Provider: [facility] Primary Cardiologist: Trey Sailors  Discharge Diagnoses    Principal Problem:   Abnormal fractional flow reserve on cardiac catheterization: LAD Active Problems:   Diastolic CHF, chronic (HCC)   Atypical atrial flutter (HCC)   Coronary artery disease involving native coronary artery with angina pectoris (HCC)   Exertional dyspnea   CAD S/P percutaneous coronary angioplasty   Percutaneous transluminal coronary angioplasty (PTCA) within last 14 to 24 months   Allergies Allergies  Allergen Reactions  . Hydrocodone-Acetaminophen Shortness Of Breath  . Other Palpitations and Other (See Comments)    ALL DECONGESTANTS - CAUSE PALPITATIONS; THROWS HEART RHYTHM OUT OF BALANCE  . Oxycodone-Acetaminophen Shortness Of Breath  . Pseudoephedrine Palpitations  . Tramadol Shortness Of Breath and Rash    flushing    Diagnostic Studies/Procedures    LHC: 07/10/16  Conclusion     Mid LAD (just distal to SP1) lesion, 65-70 %stenosed. FFR 0.75  A STENT SYNERGY DES 3.5X20 drug eluting stent was successfully placed.  Post intervention, there is a 0% residual stenosis.  3rd Mrg lesion, 55 %stenosed.   Successful PCI of LAD lesion.  Plan: Return to Short Stay for TR Band Removal & post-cath Hydration.  If stable, plan is for Same Day Discharge Later on this afternoon.  Continue ASA/Plavix for now  Consider reducing AV Nodal agent b/c concern for fatigue - defer to Roderic Palau, NP from [facility]   ____________   History of Present Illness     71 y.o.maleHLD, afib/atypical flutter s/p ablation in 01/2014 who was recently admitted 06/28/16 for cardiac cath.  Cath showed severe 2 vessel disease 95% mRCA s/p PCI with DES; ~70% prox LAD with FFR 0.75 (plan for staged PCI on April  3rd). Codominant Cx with distal ~60% lesion prior to bifurcation into distal LPL branches. He did well post cath and continued on ASA/plavix.   He presented back on 07/10/16 for outpatient staged PCI.    Hospital Course       Underwent LHC with Dr. Ellyn Hack with successful PCI of the LAD with synergy stent. He will be continued on ASA/plavix. Noted to consider reducing AV nodal agent due to fatigue but will be deferred to Roderic Palau in the AF clinic.   He was seen in short stay. No reports of chest pain or dyspnea. Able to ambulate without issues. Right radial cath site stable without bleeding or hematoma. Questions answered prior to discharge. Follow up arranged.  _____________  Discharge Vitals Blood pressure (!) 149/87, pulse 72, temperature 97.9 F (36.6 C), temperature source Oral, resp. rate (!) 26, height 6\' 3"  (1.905 m), weight 254 lb (115.2 kg), SpO2 98 %.  Filed Weights   07/10/16 0627  Weight: 254 lb (115.2 kg)    Labs & Radiologic Studies    CBC  Recent Labs  07/08/16 1604  WBC 8.9  HCT 38.3  MCV 84  PLT 177   Basic Metabolic Panel  Recent Labs  07/08/16 1604  NA 135  K 4.8  CL 99  CO2 29  GLUCOSE 129*  BUN 20  CREATININE 1.07  CALCIUM 9.7   Liver Function Tests  Recent Labs  07/08/16 1604  AST 38  ALT 60*  ALKPHOS 82  BILITOT 0.5  PROT 7.3  ALBUMIN 4.7   No results  for input(s): LIPASE, AMYLASE in the last 72 hours. Cardiac Enzymes No results for input(s): CKTOTAL, CKMB, CKMBINDEX, TROPONINI in the last 72 hours. BNP Invalid input(s): POCBNP D-Dimer No results for input(s): DDIMER in the last 72 hours. Hemoglobin A1C No results for input(s): HGBA1C in the last 72 hours. Fasting Lipid Panel No results for input(s): CHOL, HDL, LDLCALC, TRIG, CHOLHDL, LDLDIRECT in the last 72 hours. Thyroid Function Tests No results for input(s): TSH, T4TOTAL, T3FREE, THYROIDAB in the last 72 hours.  Invalid input(s): FREET3 _____________  No  results found. Disposition   Pt is being discharged home today in good condition.  Follow-up Plans & Appointments    Follow-up Information    Almyra Deforest, [place] Follow up on 07/19/2016.   Specialties:  Cardiology, Radiology Why:  at 10:30 for your follow up appt. Please arrive by 10am.  Contact information: [address] [place] [address] [phone]          Discharge Instructions    Call MD for:  redness, tenderness, or signs of infection (pain, swelling, redness, odor or green/yellow discharge around incision site)    Complete by:  As directed    Diet - low sodium heart healthy    Complete by:  As directed    Discharge instructions    Complete by:  As directed    Radial Site Care Refer to this sheet in the next few weeks. These instructions provide you with information on caring for yourself after your procedure. Your caregiver may also give you more specific instructions. Your treatment has been planned according to current medical practices, but problems sometimes occur. Call your caregiver if you have any problems or questions after your procedure. HOME CARE INSTRUCTIONS You may shower the day after the procedure.Remove the bandage (dressing) and gently wash the site with plain soap and water.Gently pat the site dry.  Do not apply powder or lotion to the site.  Do not submerge the affected site in water for 3 to 5 days.  Inspect the site at least twice daily.  Do not flex or bend the affected arm for 24 hours.  No lifting over 5 pounds (2.3 kg) for 5 days after your procedure.  Do not drive home if you are discharged the same day of the procedure. Have someone else drive you.  You may drive 24 hours after the procedure unless otherwise instructed by your caregiver.  What to expect: Any bruising will usually fade within 1 to 2 weeks.  Blood that collects in the tissue (hematoma) may be painful to the touch. It should usually decrease in size and tenderness  within 1 to 2 weeks.  SEEK IMMEDIATE MEDICAL CARE IF: You have unusual pain at the radial site.  You have redness, warmth, swelling, or pain at the radial site.  You have drainage (other than a small amount of blood on the dressing).  You have chills.  You have a fever or persistent symptoms for more than 72 hours.  You have a fever and your symptoms suddenly get worse.  Your arm becomes pale, cool, tingly, or numb.  You have heavy bleeding from the site. Hold pressure on the site.   Increase activity slowly    Complete by:  As directed       Discharge Medications   Current Discharge Medication List    CONTINUE these medications which have NOT CHANGED   Details  acetaminophen (TYLENOL) 500 MG tablet Take 1,000 mg by mouth every 6 (  six) hours as needed (for pain.).    Adalimumab 40 MG/0.8ML PNKT Inject 40 mg into the skin every 14 (fourteen) days. Fridays    aspirin 81 MG chewable tablet Chew 1 tablet (81 mg total) by mouth daily. Qty: 30 tablet, Refills: 6    Biotin 1000 MCG tablet Take 1,000 mcg by mouth at bedtime.    calcipotriene (DOVONOX) 0.005 % cream Apply 1 application topically 2 (two) times daily as needed (for psoriasis.).    carvedilol (COREG) 3.125 MG tablet Take 1 tablet (3.125 mg total) by mouth 2 (two) times daily with a meal. Qty: 60 tablet, Refills: 6    cetirizine (ZYRTEC) 10 MG tablet Take 10 mg by mouth daily.    clobetasol cream (TEMOVATE) 2.95 % Apply 1 application topically 2 (two) times daily as needed (for psoriasis).    clopidogrel (PLAVIX) 75 MG tablet Take 1 tablet (75 mg total) by mouth daily with breakfast. Qty: 30 tablet, Refills: 6    diltiazem (CARTIA XT) 120 MG 24 hr capsule Take 1 capsule (120 mg total) by mouth daily. Qty: 30 capsule, Refills: 9    Flurandrenolide (CORDRAN) 4 MCG/SQCM TAPE Apply 1 each topically at bedtime. Applied to areas affected by psoriasis.    Krill Oil 500 MG CAPS Take 500 mg by mouth at bedtime.      magnesium oxide (MAG-OX) 400 MG tablet Take 400 mg by mouth daily.    metFORMIN (GLUMETZA) 500 MG (MOD) 24 hr tablet Take 1 tablet (500 mg total) by mouth daily with breakfast. Qty: 30 tablet, Refills: 3    pantoprazole (PROTONIX) 40 MG tablet Take 1 tablet (40 mg total) by mouth daily. Qty: 30 tablet, Refills: 6    Polyethyl Glycol-Propyl Glycol (SYSTANE) 0.4-0.3 % GEL ophthalmic gel Place 1 application into both eyes at bedtime.    pravastatin (PRAVACHOL) 40 MG tablet Take 40 mg by mouth at bedtime.    predniSONE (DELTASONE) 5 MG tablet TAKE ONE TABLET BY MOUTH ONCE DAILY WITH BREAKFAST Qty: 60 tablet, Refills: 1    Probiotic Product ([place]) CAPS Take 1 capsule by mouth at bedtime.    zolpidem (AMBIEN) 10 MG tablet Take 5-10 mg by mouth at bedtime as needed for sleep.    nitroGLYCERIN (NITROSTAT) 0.4 MG SL tablet Place 1 tablet (0.4 mg total) under the tongue every 5 (five) minutes as needed for chest pain. Qty: 25 tablet, Refills: 12      STOP taking these medications     meloxicam (MOBIC) 15 MG tablet           Outstanding Labs/Studies   None  Duration of Discharge Encounter   Greater than 30 minutes including physician time.  Signed, Reino Bellis NP-C 07/10/2016, 3:54 PM

## 2016-07-10 NOTE — Discharge Instructions (Signed)
Radial Site Care °Refer to this sheet in the next few weeks. These instructions provide you with information about caring for yourself after your procedure. Your health care provider may also give you more specific instructions. Your treatment has been planned according to current medical practices, but problems sometimes occur. Call your health care provider if you have any problems or questions after your procedure. °What can I expect after the procedure? °After your procedure, it is typical to have the following: °· Bruising at the radial site that usually fades within 1-2 weeks. °· Blood collecting in the tissue (hematoma) that may be painful to the touch. It should usually decrease in size and tenderness within 1-2 weeks. °Follow these instructions at home: °· Take medicines only as directed by your health care provider. °· You may shower 24-48 hours after the procedure or as directed by your health care provider. Remove the bandage (dressing) and gently wash the site with plain soap and water. Pat the area dry with a clean towel. Do not rub the site, because this may cause bleeding. °· Do not take baths, swim, or use a hot tub until your health care provider approves. °· Check your insertion site every day for redness, swelling, or drainage. °· Do not apply powder or lotion to the site. °· Do not flex or bend the affected arm for 24 hours or as directed by your health care provider. °· Do not push or pull heavy objects with the affected arm for 24 hours or as directed by your health care provider. °· Do not lift over 10 lb (4.5 kg) for 5 days after your procedure or as directed by your health care provider. °· Ask your health care provider when it is okay to: °¨ Return to work or school. °¨ Resume usual physical activities or sports. °¨ Resume sexual activity. °· Do not drive home if you are discharged the same day as the procedure. Have someone else drive you. °· You may drive 24 hours after the procedure  unless otherwise instructed by your health care provider. °· Do not operate machinery or power tools for 24 hours after the procedure. °· If your procedure was done as an outpatient procedure, which means that you went home the same day as your procedure, a responsible adult should be with you for the first 24 hours after you arrive home. °· Keep all follow-up visits as directed by your health care provider. This is important. °Contact a health care provider if: °· You have a fever. °· You have chills. °· You have increased bleeding from the radial site. Hold pressure on the site. °Get help right away if: °· You have unusual pain at the radial site. °· You have redness, warmth, or swelling at the radial site. °· You have drainage (other than a small amount of blood on the dressing) from the radial site. °· The radial site is bleeding, and the bleeding does not stop after 30 minutes of holding steady pressure on the site. °· Your arm or hand becomes pale, cool, tingly, or numb. °This information is not intended to replace advice given to you by your health care provider. Make sure you discuss any questions you have with your health care provider. °Document Released: 04/27/2010 Document Revised: 08/31/2015 Document Reviewed: 10/11/2013 °Elsevier Interactive Patient Education © 2017 [employer] ° °

## 2016-07-10 NOTE — Interval H&P Note (Signed)
History and Physical Interval Note:  07/10/2016 7:54 AM  Kenneth [name]  has presented today for surgery, with the diagnosis of CAD WITH ABNORMAL FFR  The various methods of treatment have been discussed with the patient and family. After consideration of risks, benefits and other options for treatment, the patient has consented to  Procedure(s): Coronary Stent Intervention (N/A) as a surgical intervention .  The patient's history has been reviewed, patient examined, no change in status, stable for surgery.  I have reviewed the patient's chart and labs.  Questions were answered to the patient's satisfaction.    Cath Lab Visit (complete for each Cath Lab visit)  Clinical Evaluation Leading to the Procedure:   ACS: No.  Non-ACS:    Anginal Classification: CCS II  Anti-ischemic medical therapy: Maximal Therapy (2 or more classes of medications)  Non-Invasive Test Results: High-risk stress test findings: cardiac mortality >3%/year -  Abnormal FFR of LAD  Prior CABG: No previous CABG   [name]

## 2016-07-10 NOTE — H&P (View-Only) (Signed)
Patient ID: BRAVE DACK, male   DOB: 10-31-1945, 71 y.o.   MRN: [id number]       PCP: [facility]  Pulmonologist: Dr. Elsworth Soho Cardiologist: Dr. Westley Foots is a 71 y.o. male who presents today in [facility] s/p afib/flutter ablation 10/15. He reports doing well from a heart standpoint without any awareness of sustatined irregular heartbeat. He is off daily antiarrythmic and blood thinner. Has prn flecainide to use. He does have interstitial lung disease and is on daily prednisone.  Had a flair recently and had to increase prednisone to 10 mg daily. Unfortunately, he has not been able to reduce back to 5 mg without being symptomatic. He is having close f/u with Dr. Elsworth Soho. 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   He asked to be seen 2/27,  after feeling a few extra beats and thought he may be in a afib/flutter. He states that he has been very winded with his activities and has noted increased fatigue. 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 He was very winded when he got to clinic today but he got lost in the hospital and had to walk a distance. His EKG showed SR at 88 bpm. His description of irregular heart beat does sound like he is having PC's vrs afib or flutter. He has not had to use flecainide since his ablation, per Dr. Jackalyn Lombard note 11/2014, he was off anticoagulation with chadsvasc score of 2. He continues to see Dr. Elsworth Soho for ILD. He was reassured that his heart rhythm was SR and I thought he was having prematuire contractions. He was set up for an stress myoview, echo and monitor which is pending 3/27.  F/u in afib clinic 3/20. He feels "terrible." States he is not sleeping, very fatigued and short of breath with exertion that he feels is progressive over the last few months. He feels that he is out of rhythm but EKG shows SR with PAC/sinus arrhythmia. His sister just died unexpectedly  from the flu which is heavy on his mind.. He has to go on business to [place] over the next few days and he wants to know if he will live to come back.  He had f/u with Dr. Elsworth Soho and his lung status was thought to be stable with known ILD. However, he had OSA in the past thru the [place] that he never treated and Dr. Elsworth Soho wants to order another test. He states that he will fall asleep and then wake up short of breath.  He sleeps very poorly and is tired on awakening.  He had a chest Ct in January that showed [place] that had progressed slightly in comparison to 2016. It also showed CAD involving the LAD, Circumflex and Rt coronary arteries as well as aortic atherosclerosis. He denies exertional chest pain.   F/u from cardiac catherization performed 3/23 for progressive shortness of breath and 3v CAD seen on chest CT. He did receive a DES of RCA and is staged to have a stent placed of the LAD 4/4. He is breathing much better. He was asked to return here to decide if he needs to be on triple therapy(chadsvas score is now 4) and brief runs noted on monitor of SVT vrs afib. I discussed with Dr. Rayann Heman and he said if no sustained afib/flutter was seen/documented that he would hold off for now due to risk of bleeding  with triple therapy. It was discussed with pt being noncompliant with meds for  BP,DM HLD. He has restarted pravastatin and metformin  and is pending an appointment with PCP at [facility] soon. He was stressed that these issues need to be well managed to prevent progression of his CAD.   Today, he denies symptoms of palpitations,no presyncope, or syncope. Positive for fatigue, progressive dyspnea. exertional dyspnea   The patient is otherwise without complaint today as for above.  Past Medical History:  Diagnosis Date  . Adenomatous polyp of colon 2006  . Anemia   . Arthritis    "qwhere" (06/28/2016)  . Atypical chest pain    a. 06/2003 Cath:  nl cors;  b. 11/2006 MV: no ischemia, nl LV  . Coronary artery disease     . Diabetes mellitus without complication ([place])    "suppose to be on Metformin but doesn't take it" (06/28/2016)  . Diverticulosis of colon (without mention of hemorrhage) 1999, 2006  . Dyspepsia   . Epiretinal membrane    "epiretinal attachment" (06/28/2016)  . Esophagitis 1999  . GERD (gastroesophageal reflux disease)   . Heart murmur   . Hiatal hernia 1999  . History of kidney stones   . HOH (hard of hearing)    "wears aids"   . Hyperlipidemia   . Hypertension   . 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 Paroxysmal atrial fibrillation ([place])    a. 11/2003 Tikosyn initiated - subsequently d/c'd;  b. 2006 RFCA for Afib @ [facility];  c. 09/2011 Echo: EF 60-65%, Gr 2 DD;  d. 10/2011 DCCV  . Pneumonia 1990s X 1  . Psoriasis 1967  . Rheumatic fever ~ 1958  . Seasonal allergies   . Skin cancer    on leg, left arm  . Sleep apnea    patient has had the sleep test, but states they said it didn't need treatment; "will repeat test at home" (06/28/2016)   Past Surgical History:  Procedure Laterality Date  . APPENDECTOMY  1996  . ATRIAL FIBRILLATION ABLATION N/A 01/13/2014   repeat PVI, also left atrial ablation performed with successfull ablation of LA flutter by Dr Rayann Heman  . ATRIAL FIBRILLATION ABLATION  2006   Dr Rolland Porter [facility]  . CARDIAC CATHETERIZATION  2005  . CARDIOVERSION N/A 05/22/2013   Procedure: CARDIOVERSION;  Surgeon: [name], MD;  Location: [facility];  Service: Cardiovascular;  Laterality: N/A;  . CARDIOVERSION N/A 10/23/2011   Procedure: CARDIOVERSION;  Surgeon: Deboraha Sprang, MD;  Location: [facility] C[facility] LAB;  Service: Cardiovascular;  Laterality: N/A;  . CARDIOVERSION N/A 05/01/2012   Procedure: CARDIOVERSION;  Surgeon: Deboraha Sprang, MD;  Location: [facility] CATH LAB;  Service: Cardiovascular;  Laterality: N/A;  . CARPAL TUNNEL RELEASE Right 1980s?  Marland Kitchen CATARACT EXTRACTION W/ INTRAOCULAR LENS  IMPLANT, BILATERAL Bilateral   . COLONOSCOPY    . CORONARY  ANGIOPLASTY WITH STENT PLACEMENT  06/28/2016  . CORONARY STENT INTERVENTION N/A 06/28/2016   Procedure: Coronary Stent Intervention;  Surgeon: Leonie Man, MD;  Location: [facility] CV LAB;  Service: Cardiovascular;  Laterality: N/A;  . CYSTOSCOPY W/ STONE MANIPULATION  2010   Patient states he has had several kidney stones up until aroune 2010, none since (06/28/2016)  . ESOPHAGOGASTRODUODENOSCOPY    . INTRAVASCULAR PRESSURE WIRE/FFR STUDY N/A 06/28/2016   Procedure: Intravascular Pressure Wire/FFR Study;  Surgeon: Leonie Man, MD;  Location: [place] [facility] CV LAB;  Service:  Cardiovascular;  Laterality: N/A;  . NASAL SINUS SURGERY     x 2  . RIGHT/LEFT HEART CATH AND CORONARY ANGIOGRAPHY N/A 06/28/2016   Procedure: Right/Left Heart Cath and Coronary Angiography;  Surgeon: Leonie Man, MD;  Location: [place] CV LAB;  Service: Cardiovascular;  Laterality: N/A;  . SHOULDER OPEN ROTATOR CUFF REPAIR Bilateral   . TONSILLECTOMY      ROS- all systems are reviewed and negatives except as per HPI above  Current Outpatient Prescriptions  Medication Sig Dispense Refill  . acetaminophen (TYLENOL) 500 MG tablet Take 1,000 mg by mouth every 6 (six) hours as needed (for pain.).    . Adalimumab 40 MG/0.8ML PNKT Inject 40 mg into the skin every 14 (fourteen) days. Fridays    . aspirin 81 MG chewable tablet Chew 1 tablet (81 mg total) by mouth daily. 30 tablet 6  . Biotin 1000 MCG tablet Take 1,000 mcg by mouth at bedtime.    . calcipotriene (DOVONOX) 0.005 % cream Apply 1 application topically 2 (two) times daily as needed (for psoriasis.).    Marland Kitchen carvedilol (COREG) 3.125 MG tablet Take 1 tablet (3.125 mg total) by mouth 2 (two) times daily with a meal. 60 tablet 6  . cetirizine (ZYRTEC) 10 MG tablet Take 10 mg by mouth daily.    . clobetasol cream (TEMOVATE) 5.63 % Apply 1 application topically 2 (two) times daily as needed (for psoriasis).    . clopidogrel (PLAVIX) 75 MG tablet Take 1 tablet  (75 mg total) by mouth daily with breakfast. 30 tablet 6  . diltiazem (CARTIA XT) 120 MG 24 hr capsule Take 1 capsule (120 mg total) by mouth daily. (Patient taking differently: Take 120 mg by mouth every evening. ) 30 capsule 9  . Flurandrenolide (CORDRAN) 4 MCG/SQCM TAPE Apply 1 each topically at bedtime. Applied to areas affected by psoriasis.    Javier Docker Oil 500 MG CAPS Take 500 mg by mouth at bedtime.    . meloxicam (MOBIC) 15 MG tablet Take 15 mg by mouth daily as needed for pain.    . pantoprazole (PROTONIX) 40 MG tablet Take 1 tablet (40 mg total) by mouth daily. 30 tablet 6  . Polyethyl Glycol-Propyl Glycol (SYSTANE OP) Apply 1 drop to eye daily.    Vladimir Faster Glycol-Propyl Glycol (SYSTANE) 0.4-0.3 % GEL ophthalmic gel Place 1 application into both eyes at bedtime.    . predniSONE (DELTASONE) 5 MG tablet TAKE ONE TABLET BY MOUTH ONCE DAILY WITH BREAKFAST 60 tablet 1  . Probiotic Product ([place]) CAPS Take 1 capsule by mouth at bedtime.    Marland Kitchen zolpidem (AMBIEN) 10 MG tablet Take 5-10 mg by mouth at bedtime as needed for sleep.    . metFORMIN (GLUMETZA) 500 MG (MOD) 24 hr tablet Take 1 tablet (500 mg total) by mouth daily with breakfast. 30 tablet 3  . nitroGLYCERIN (NITROSTAT) 0.4 MG SL tablet Place 1 tablet (0.4 mg total) under the tongue every 5 (five) minutes as needed for chest pain. (Patient not taking: Reported on 07/04/2016) 25 tablet 12   Current Facility-Administered Medications  Medication Dose Route Frequency Provider Last Rate Last Dose  . 0.9 %  sodium chloride infusion  500 mL Intravenous Continuous Ladene Artist, MD       Facility-Administered Medications Ordered in Other Encounters  Medication Dose Route Frequency Provider Last Rate Last Dose  . lidocaine (cardiac) 100 mg/22ml (XYLOCAINE) 20 MG/ML injection 2%    Anesthesia Intra-op Rokoshi T  Flowers, CRNA   60 mg at 03/25/13 1617  . propofol (DIPRIVAN) 10 mg/mL bolus/IV push    Anesthesia Intra-op Durwin Glaze  Flowers, CRNA   90 mg at 03/25/13 1617    Physical Exam: Vitals:   07/04/16 1031  BP: (!) 146/82  Pulse: 77  SpO2: 95%  Weight: 262 lb 8 oz (119.1 kg)  BP rechecked at 148/80  GEN- The patient is well appearing, alert and oriented x 3 today.   Head- normocephalic, atraumatic Eyes-  Sclera clear, conjunctiva pink Ears- hearing intact Oropharynx- clear Lungs-   Normal work of breathing Heart- Regular rate and rhythm, no murmurs, rubs or gallops, PMI not laterally displaced. GI- soft, NT, ND, + BS Extremities- no clubbing, cyanosis, or edema  EKG- NSR, normal EKG, 78 bpm,pr int 174 ms, Qrs int  45ms, qtc 421 ms Epic records reviewed Cardiac catherization-3/23Conclusion     Mid RCA lesion, 99 %stenosed.  A STENT SYNERGY DES 2.75X20 drug eluting stent was successfully placed.  Post intervention, there is a 0% residual stenosis.  Mid LAD lesion, 65 %stenosed. FFR performed => final FFR 0.75 (physiologically significant). Plan staged PCI  3rd Mrg lesion, 55 %stenosed.  Hemodynamic findings consistent with mild pulmonary hypertension. LV end diastolic pressure is moderately elevated.  The left ventricular systolic function is normal.  The left ventricular ejection fraction is 55-65% by visual estimate.    Mild secondary pulmonary hypertension  Severe 2 Vessel disease - 95% mRCA s/p PCI with DES; ~70% prox LAD with FFR 0.75  Codominant Cx with distal ~60% lesion prior to bifurcation into distal LPL branches  Plan: Overnight monitoring post cath. He will need close monitoring of the right groin hematoma. Likely discharge tomorrow. We'll start scheduled PCI LAD 1st week of April (~April 3rd)- would plan same day d/c  Continue Plavix. Add Statin for d/c. Will likely need to d/c Flecainide given CAD Dx. - Defer to Afib Clinic; continue Diltiazem. Defer full anticoagulation to       Assessment and Plan:  1. Afib/ atypical flutter Doing well s/p ablation wtihout  recurrence Few short bursts of SVT vrs afib noted in hospital Pt was not taking DOAC in past per his wishes For now hold off on triple therapy with adding back in [place], but if afib/flutter becomes prevalent will need to restart Continue cardizem chads2vasc score is 4  2. ILD Continue f/u with  pulmonologist  3. HTN Acceptable but not optimal F/u with PCP is pending  4. DM Pt has stopped metformin some time ago He has restarted   5. Hyperlipidemia Has restarted pravastatin Has not taken in some time Will need lipid profile in near future  6. CAD New DES stent to RCA Staged LAD stent pending 4/4 Continue asa/plavix Asked to not take meloxicam No longer able to take flecainide, with new dx of CAD, however he has not taken since last ablation   f/u with Dr. Ellyn Hack as scheduled  F/u with Dr. Rayann Heman as scheduled  F/u with Dr. Elsworth Soho as scheduled   Geroge Baseman. Zacharey Jensen, [facility] [address] [place], [place] [address] [phone]

## 2016-07-10 NOTE — Progress Notes (Addendum)
CARDIAC REHAB PHASE I   PRE:  Rate/Rhythm: 75 SR    BP: sitting 158/59    SaO2: 99 RA  MODE:  Ambulation: 600 ft   POST:  Rate/Rhythm: 88 SR    BP: sitting 143/77     SaO2:   Pt eager to get up, hurting sitting in recliner. Tolerated well. Reviewed ed (we just saw last week). I answered his questions regarding CRPII, ex, and diet. His wife sts he is in denial regarding being a DM. His A1C was 7.5 recently. We discussed carb counting and increasing exercise. He understands the importance of Plavix. He is already referred to [facility] [phone]   [place], ACSM 07/10/2016 2:14 PM

## 2016-07-11 ENCOUNTER — Telehealth: Payer: Self-pay | Admitting: Pulmonary Disease

## 2016-07-11 ENCOUNTER — Encounter (HOSPITAL_COMMUNITY): Payer: Self-pay | Admitting: Cardiology

## 2016-07-11 MED FILL — Nitroglycerin IV Soln 100 MCG/ML in D5W: INTRA_ARTERIAL | Qty: 10 | Status: AC

## 2016-07-11 NOTE — Telephone Encounter (Signed)
I just spoke with Kenneth [name] about scheduling the HST that Dr. Elsworth Soho ordered and he and his wife think that he no longer needs to do the HST. He has had to stents placed and he is breathing better and sleeping better with no snoring per the wife.  Dr. Elsworth Soho the wife wanted to thank you for saving her husband's life by ordering the CT of the chest and sending him to his Cardiologist.

## 2016-07-11 NOTE — Addendum Note (Signed)
Addended by: Valerie Salts on: 07/11/2016 03:28 PM   Modules accepted: Orders

## 2016-07-11 NOTE — Telephone Encounter (Signed)
Spoke with Kenneth [name] about HST. She verbalized that they wanted to cancel the HST. Will cancel the HST. Nothing else is needed at the moment.

## 2016-07-11 NOTE — Telephone Encounter (Signed)
I reviewed Reports OK to cancel HST He can keep regular appointment

## 2016-07-19 ENCOUNTER — Encounter: Payer: Self-pay | Admitting: Physician Assistant

## 2016-07-19 ENCOUNTER — Ambulatory Visit (INDEPENDENT_AMBULATORY_CARE_PROVIDER_SITE_OTHER): Payer: Medicare Other | Admitting: Physician Assistant

## 2016-07-19 VITALS — BP 143/72 | HR 68 | Ht 75.0 in | Wt 263.6 lb

## 2016-07-19 DIAGNOSIS — I10 Essential (primary) hypertension: Secondary | ICD-10-CM

## 2016-07-19 DIAGNOSIS — E785 Hyperlipidemia, unspecified: Secondary | ICD-10-CM

## 2016-07-19 DIAGNOSIS — I2510 Atherosclerotic heart disease of native coronary artery without angina pectoris: Secondary | ICD-10-CM | POA: Diagnosis not present

## 2016-07-19 DIAGNOSIS — I480 Paroxysmal atrial fibrillation: Secondary | ICD-10-CM

## 2016-07-19 DIAGNOSIS — J849 Interstitial pulmonary disease, unspecified: Secondary | ICD-10-CM

## 2016-07-19 DIAGNOSIS — Z9861 Coronary angioplasty status: Secondary | ICD-10-CM

## 2016-07-19 DIAGNOSIS — E119 Type 2 diabetes mellitus without complications: Secondary | ICD-10-CM | POA: Diagnosis not present

## 2016-07-19 NOTE — Patient Instructions (Signed)
Medication Instructions:  Your physician recommends that you continue on your current medications as directed. Please refer to the Current Medication list given to you today.  If you need a refill on your cardiac medications before your next appointment, please call your pharmacy.  Labwork: FLP AND LFT AT PCP'S OFFICE IN 4 WEEKS PLEASE FAX TO [phone]  Follow-Up: Your physician wants you to follow-up in: 2 MONTHS WITH DR HARDING.  Thank you for choosing [facility]!!    HAO MENG, PA-C [place], LPN

## 2016-07-19 NOTE — Progress Notes (Signed)
Cardiology Office Note    Date:  07/20/2016   ID:  Kenneth [name], DOB Feb 24, 1946, MRN [id number]  PCP:  [facility]  Cardiologist:  Plan to set up with Dr. Ellyn Hack (Remotely Dr. Stanford Breed in 09/2011, has been followed by EP in recently years) Atrial fibrillation clinic: Dr. Rayann Heman, Dr. Caryl Comes and Roderic Palau NP Pulmonologist: Dr. Elsworth Soho  Chief Complaint  Patient presents with  . Follow-up    seen for Dr. Ellyn Hack, post PCI    History of Present Illness:  Kenneth [name] is a 71 y.o. male with PMH of DM II, HTN, HLD, interstitial lung disease on steroid and PAF s/p ablation in 2006 and again in 2015. He had a remote cardiac catheterization in March 2005-showed normal LV function and no significant coronary artery disease. He had atrial fibrillation ablation at [facility] in 2006. Myoview obtained in August 2008 showed EF 58% and no ischemia. The last time she was seen by Dr. Stanford Breed was on 09/30/2011, since then he has been largely followed by Dr. Caryl Comes, Dr. Rayann Heman and in the atrial fibrillation clinic.  Last echocardiogram obtained on 11/15/2013  showed EF 65-70%, no regional wall abnormality, mild TR, PA peak pressure 35 mmHg, dilated ascending aorta 41 mm. She underwent atrial fibrillation and atrial flutter ablation in October 2015.  Recently, patient has been having more shortness of breath and fatigue. CT scan of the chest showed triple-vessel coronary artery disease. Patient underwent left and right heart catheterization on 06/28/2016 which showed 99% mid RCA lesion treated with 2.75 x 20 mm DES, 65% mid LAD lesion with final FFR 0.75 which was significant, 55% third OM lesion, EF 55-65%. Given the coronary artery disease, it was recommended for the patient to discontinue flecainide. He returned on 07/10/2016 and underwent staged PCI of mid LAD lesion treated with 3.5 x 20 mm DES. His DOAC was also stopped to avoid excessive bleeding risk on DOAC. If atrial fibrillation or atrial flutter  become more prevalent, then we will restarted on DOAC  Patient present to cardiology office visit today on 07/19/2016, he never had any chest pain, his primary anginal symptom is shortness of breath. Since he has interstitial lung disease, this makes it difficult to diagnose. Otherwise his shortness of breath has significantly improved after recent cardiac catheterization and PCI. We have reviewed his cath film. We also discussed the need to be compliant with dual antiplatelet therapy. He only recently started on Pravachol 40 mg daily a week ago, he will need to obtain a fasting lipid panel and LFTs in 4 weeks when he sees his primary care physician at [facility]. He will need to fax the records to [place]. Otherwise despite the fact that his blood pressure is mildly elevated today, his blood pressure was actually normal during the last office visit. I would not recommend any adjustment of the medication.   Past Medical History:  Diagnosis Date  . Adenomatous polyp of colon 2006  . Anemia   . Arthritis    "qwhere" (06/28/2016)  . Atypical chest pain    a. 06/2003 Cath:  nl cors;  b. 11/2006 MV: no ischemia, nl LV  . Coronary artery disease    06/28/16 PCI with DES--> RCA 07/10/16 PCI with DES--> LAD  . Diabetes mellitus without complication ([place])    "suppose to be on Metformin but doesn't take it" (06/28/2016)  . Diverticulosis of colon (without mention of hemorrhage) 1999, 2006  . Dyspepsia   . Epiretinal membrane    "  epiretinal attachment" (06/28/2016)  . Esophagitis 1999  . GERD (gastroesophageal reflux disease)   . Heart murmur   . Hiatal hernia 1999  . History of kidney stones   . HOH (hard of hearing)    "wears aids"   . Hyperlipidemia   . Hypertension   . 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 Paroxysmal atrial fibrillation ([place])    a. 11/2003 Tikosyn initiated - subsequently d/c'd;  b. 2006 RFCA for Afib @ [facility];  c. 09/2011 Echo: EF 60-65%, Gr 2 DD;  d.  10/2011 DCCV  . Pneumonia 1990s X 1  . Psoriasis 1967  . Rheumatic fever ~ 1958  . Seasonal allergies   . Skin cancer    on leg, left arm  . Sleep apnea    patient has had the sleep test, but states they said it didn't need treatment; "will repeat test at home" (06/28/2016)    Past Surgical History:  Procedure Laterality Date  . APPENDECTOMY  1996  . ATRIAL FIBRILLATION ABLATION N/A 01/13/2014   repeat PVI, also left atrial ablation performed with successfull ablation of LA flutter by Dr Rayann Heman  . ATRIAL FIBRILLATION ABLATION  2006   Dr Rolland Porter at [facility]  . CARDIAC CATHETERIZATION  2005  . CARDIOVERSION N/A 05/22/2013   Procedure: CARDIOVERSION;  Surgeon: [name], MD;  Location: [place];  Service: Cardiovascular;  Laterality: N/A;  . CARDIOVERSION N/A 10/23/2011   Procedure: CARDIOVERSION;  Surgeon: Deboraha Sprang, MD;  Location: [facility] CATH LAB;  Service: Cardiovascular;  Laterality: N/A;  . CARDIOVERSION N/A 05/01/2012   Procedure: CARDIOVERSION;  Surgeon: Deboraha Sprang, MD;  Location: [facility] CATH LAB;  Service: Cardiovascular;  Laterality: N/A;  . CARPAL TUNNEL RELEASE Right 1980s?  Marland Kitchen CATARACT EXTRACTION W/ INTRAOCULAR LENS  IMPLANT, BILATERAL Bilateral   . COLONOSCOPY    . CORONARY ANGIOPLASTY WITH STENT PLACEMENT  06/28/2016  . CORONARY STENT INTERVENTION N/A 06/28/2016   Procedure: Coronary Stent Intervention;  Surgeon: Leonie Man, MD;  Location: [place] CV LAB;  Service: Cardiovascular;  Laterality: N/A;  . CORONARY STENT INTERVENTION N/A 07/10/2016   Procedure: Coronary Stent Intervention;  Surgeon: Leonie Man, MD;  Location: [place] CV LAB;  Service: Cardiovascular;  Laterality: N/A;  . CYSTOSCOPY W/ STONE MANIPULATION  2010   Patient states he has had several kidney stones up until aroune 2010, none since (06/28/2016)  . ESOPHAGOGASTRODUODENOSCOPY    . INTRAVASCULAR PRESSURE WIRE/FFR STUDY N/A 06/28/2016   Procedure: Intravascular Pressure Wire/FFR Study;  Surgeon:  Leonie Man, MD;  Location: [place] CV LAB;  Service: Cardiovascular;  Laterality: N/A;  . NASAL SINUS SURGERY     x 2  . RIGHT/LEFT HEART CATH AND CORONARY ANGIOGRAPHY N/A 06/28/2016   Procedure: Right/Left Heart Cath and Coronary Angiography;  Surgeon: Leonie Man, MD;  Location: [place] CV LAB;  Service: Cardiovascular;  Laterality: N/A;  . SHOULDER OPEN ROTATOR CUFF REPAIR Bilateral   . TONSILLECTOMY      Current Medications: Outpatient Medications Prior to Visit  Medication Sig Dispense Refill  . acetaminophen (TYLENOL) 500 MG tablet Take 1,000 mg by mouth every 6 (six) hours as needed (for pain.).    . Adalimumab 40 MG/0.8ML PNKT Inject 40 mg into the skin every 14 (fourteen) days. Fridays    . aspirin 81 MG chewable tablet Chew 1 tablet (81 mg total) by mouth daily. 30 tablet 6  . Biotin 1000  MCG tablet Take 1,000 mcg by mouth at bedtime.    . calcipotriene (DOVONOX) 0.005 % cream Apply 1 application topically 2 (two) times daily as needed (for psoriasis.).    Marland Kitchen carvedilol (COREG) 3.125 MG tablet Take 1 tablet (3.125 mg total) by mouth 2 (two) times daily with a meal. 60 tablet 6  . cetirizine (ZYRTEC) 10 MG tablet Take 10 mg by mouth daily.    . clobetasol cream (TEMOVATE) 1.19 % Apply 1 application topically 2 (two) times daily as needed (for psoriasis).    . clopidogrel (PLAVIX) 75 MG tablet Take 1 tablet (75 mg total) by mouth daily with breakfast. 30 tablet 6  . diltiazem (CARTIA XT) 120 MG 24 hr capsule Take 1 capsule (120 mg total) by mouth daily. (Patient taking differently: Take 120 mg by mouth every evening. ) 30 capsule 9  . Flurandrenolide (CORDRAN) 4 MCG/SQCM TAPE Apply 1 each topically at bedtime. Applied to areas affected by psoriasis.    Javier Docker Oil 500 MG CAPS Take 500 mg by mouth at bedtime.    . magnesium oxide (MAG-OX) 400 MG tablet Take 400 mg by mouth daily.    . metFORMIN (GLUMETZA) 500 MG (MOD) 24 hr tablet Take 1 tablet (500 mg total) by mouth  daily with breakfast. 30 tablet 3  . nitroGLYCERIN (NITROSTAT) 0.4 MG SL tablet Place 1 tablet (0.4 mg total) under the tongue every 5 (five) minutes as needed for chest pain. 25 tablet 12  . pantoprazole (PROTONIX) 40 MG tablet Take 1 tablet (40 mg total) by mouth daily. 30 tablet 6  . Polyethyl Glycol-Propyl Glycol (SYSTANE) 0.4-0.3 % GEL ophthalmic gel Place 1 application into both eyes at bedtime.    . pravastatin (PRAVACHOL) 40 MG tablet Take 40 mg by mouth at bedtime.    . predniSONE (DELTASONE) 5 MG tablet TAKE ONE TABLET BY MOUTH ONCE DAILY WITH BREAKFAST 60 tablet 1  . Probiotic Product ([place]) CAPS Take 1 capsule by mouth at bedtime.    Marland Kitchen zolpidem (AMBIEN) 10 MG tablet Take 5-10 mg by mouth at bedtime as needed for sleep.     Facility-Administered Medications Prior to Visit  Medication Dose Route Frequency Provider Last Rate Last Dose  . lidocaine (cardiac) 100 mg/48ml (XYLOCAINE) 20 MG/ML injection 2%    Anesthesia Intra-op Rokoshi T Flowers, CRNA   60 mg at 03/25/13 1617  . propofol (DIPRIVAN) 10 mg/mL bolus/IV push    Anesthesia Intra-op Durwin Glaze Flowers, CRNA   90 mg at 03/25/13 1617     Allergies:   Hydrocodone-acetaminophen; Other; Oxycodone-acetaminophen; Pseudoephedrine; and Tramadol   Social History   Social History  . Marital status: Married    Spouse name: N/A  . Number of children: 1  . Years of education: N/A   Occupational History  . Retired Armed forces operational officer    Social History Main Topics  . Smoking status: Former Smoker    Years: 10.00    Types: Cigars    Quit date: 09/29/2001  . Smokeless tobacco: Former Systems developer    Types: Snuff, Chew    Quit date: 09/29/2001  . Alcohol use No  . Drug use: No  . Sexual activity: Not Asked   Other Topics Concern  . None   Social History Narrative   Pt lives in [place] with spouse.     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     Family History:  The patient's family history includes Breast  cancer in his  mother; Colon cancer in his mother; Coronary artery disease in his mother and paternal grandfather; Heart disease in his maternal grandmother; Stroke in his paternal grandfather.   ROS:   Please see the history of present illness.    ROS All other systems reviewed and are negative.   PHYSICAL EXAM:   VS:  BP (!) 143/72   Pulse 68   Ht 6\' 3"  (1.905 m)   Wt 263 lb 9.6 oz (119.6 kg)   BMI 32.95 kg/m    GEN: Well nourished, well developed, in no acute distress  HEENT: normal  Neck: no JVD, carotid bruits, or masses Cardiac: RRR; no murmurs, rubs, or gallops,no edema  Respiratory:  clear to auscultation bilaterally, normal work of breathing GI: soft, nontender, nondistended, + BS MS: no deformity or atrophy  Skin: warm and dry, no rash Neuro:  Alert and Oriented x 3, Strength and sensation are intact Psych: euthymic mood, full affect  Wt Readings from Last 3 Encounters:  07/19/16 263 lb 9.6 oz (119.6 kg)  07/10/16 254 lb (115.2 kg)  07/04/16 262 lb 8 oz (119.1 kg)      Studies/Labs Reviewed:   EKG:  EKG is not ordered today.    Recent Labs: 06/25/2016: TSH 1.018 06/29/2016: Hemoglobin 12.2 07/08/2016: ALT 60; BUN 20; Creatinine, Ser 1.07; Platelets 261; Potassium 4.8; Sodium 135   Lipid Panel    Component Value Date/Time   CHOL (H) 08/17/2009 0444    246        ATP III CLASSIFICATION:  <200     mg/dL   Desirable  200-239  mg/dL   Borderline High  >=240    mg/dL   High          TRIG 866 (H) 08/17/2009 0444   HDL 26 (L) 08/17/2009 0444   CHOLHDL 9.5 08/17/2009 0444   VLDL UNABLE TO CALCULATE IF TRIGLYCERIDE OVER 400 mg/dL 08/17/2009 0444   LDLCALC  08/17/2009 0444    UNABLE TO CALCULATE IF TRIGLYCERIDE OVER 400 mg/dL        Total Cholesterol/HDL:CHD Risk Coronary Heart Disease Risk Table                     Men   Women  1/2 Average Risk   3.4   3.3  Average Risk       5.0   4.4  2 X Average Risk   9.6   7.1  3 X Average Risk  23.4   11.0        Use  the calculated Patient Ratio above and the CHD Risk Table to determine the patient's CHD Risk.        ATP III CLASSIFICATION (LDL):  <100     mg/dL   Optimal  100-129  mg/dL   Near or Above                    Optimal  130-159  mg/dL   Borderline  160-189  mg/dL   High  >190     mg/dL   Very High    Additional studies/ records that were reviewed today include:    Echo 11/15/2013 LV EF: 65% -  70%  ------------------------------------------------------------------- Indications:   Atrial fibrillation - 427.31. Shortness of breath 786.05.  ------------------------------------------------------------------- History:  PMH: Acquired from the patient and from the patient&'s chart. Dyspnea. Atrial fibrillation. Congestive heart failure, with an ejection fraction of 65%by echocardiography. The dysfunction is primarily diastolic. Chronic obstructive pulmonary disease.  Risk factors: Obese. Dyslipidemia.  ------------------------------------------------------------------- Study Conclusions  - Left ventricle: The cavity size was normal. There was mild concentric hypertrophy. Systolic function was vigorous. The estimated ejection fraction was in the range of 65% to 70%. Wall motion was normal; there were no regional wall motion abnormalities. The study was not technically sufficient to allow evaluation of LV diastolic dysfunction due to atrial fibrillation. - Aortic valve: Trileaflet; mildly thickened, mildly calcified leaflets. Cusp separation was mildly reduced. Sclerosis without stenosis. There was no regurgitation. - Ascending aorta: The ascending aorta was mildly dilated maesuring 41 mm. - Mitral valve: Calcified annulus. Mildly thickened leaflets . - Left atrium: The atrium was mildly to moderately dilated. - Right ventricle: Systolic function was normal. - Right atrium: The atrium was normal in size. - Tricuspid valve: There was mild  regurgitation. - Pulmonic valve: There was no regurgitation. - Pulmonary arteries: PA peak pressure: 35 mm Hg (S). - Systemic veins: Not visualized. - Pericardium, extracardiac: There was no pericardial effusion.  Impressions:  - Normal biventricular size and systolic function. Mild LVH. IVC was not visualized. PASP is at least upper normal 35 mmHg.   Cath 06/28/2016 Conclusion     Mid RCA lesion, 99 %stenosed.  A STENT SYNERGY DES 2.75X20 drug eluting stent was successfully placed.  Post intervention, there is a 0% residual stenosis.  Mid LAD lesion, 65 %stenosed. FFR performed => final FFR 0.75 (physiologically significant). Plan staged PCI  3rd Mrg lesion, 55 %stenosed.  Hemodynamic findings consistent with mild pulmonary hypertension. LV end diastolic pressure is moderately elevated.  The left ventricular systolic function is normal.  The left ventricular ejection fraction is 55-65% by visual estimate.    Mild secondary pulmonary hypertension  Severe 2 Vessel disease - 95% mRCA s/p PCI with DES; ~70% prox LAD with FFR 0.75  Codominant Cx with distal ~60% lesion prior to bifurcation into distal LPL branches  Plan: Overnight monitoring post cath. He will need close monitoring of the right groin hematoma. Likely discharge tomorrow. We'll start scheduled PCI LAD 1st week of April (~April 3rd)- would plan same day d/c  Continue Plavix. Add Statin for d/c. Will likely need to d/c Flecainide given CAD Dx. - Defer to Afib Clinic; continue Diltiazem. Defer full anticoagulation to Afib Clinic.    Cath 07/10/2016 Conclusion     Mid LAD (just distal to SP1) lesion, 65-70 %stenosed. FFR 0.75  A STENT SYNERGY DES 3.5X20 drug eluting stent was successfully placed.  Post intervention, there is a 0% residual stenosis.  3rd Mrg lesion, 55 %stenosed.   Successful PCI of LAD lesion.  Plan: Return to Short Stay for TR Band Removal & post-cath Hydration.  If  stable, plan is for Same Day Discharge Later on this afternoon.  Continue ASA/Plavix for now  Consider reducing AV Nodal agent b/c concern for fatigue - defer to Roderic Palau, NP from [place]:    1. Coronary artery disease involving native coronary artery of native heart without angina pectoris   2. Interstitial lung disease ([place])   3. Essential hypertension   4. Hyperlipidemia, unspecified hyperlipidemia type   5. Controlled type 2 diabetes mellitus without complication, without long-term current use of insulin ([place])   6. PAF (paroxysmal atrial fibrillation) ([facility])      PLAN:  In order of problems listed above:  1. CAD s/p DES to mid RCA and mid LAD: He never had chest pain, his main symptom was dyspnea on  exertion. Since he has underlying interstitial lung disease, this makes his diagnosis very difficult. Since the recent PCI, his dyspnea on exertion has improved significantly.   2. Interstitial lung disease: Followed by Dr. Elsworth Soho, has bilateral fine crackles at baseline  3. HTN: Blood pressure mildly elevated today, however his blood pressure was normal on the last office visit. I will hold off on adjusting medications at this time   4. HLD: On Pravachol, he will need a fasting lipid panel and LFTs in 4 weeks during his visit with his primary care physician  5. DM II: On metformin, managed by primary care physician  6. PAF s/p ablation in 2008 and 2015: DOAC stopped in light of the need for antiplatelet therapy. Since his last ablation in 2015, his symptom has significantly improved. Unless he has more frequent atrial fibrillation, we will hold off on reinitiating DOAC. He is also off flecainide given his structural heart disease diagnosed recently via cath.    Medication Adjustments/Labs and Tests Ordered: Current medicines are reviewed at length with the patient today.  Concerns regarding medicines are outlined above.  Medication changes, Labs and Tests  ordered today are listed in the Patient Instructions below. Patient Instructions  Medication Instructions:  Your physician recommends that you continue on your current medications as directed. Please refer to the Current Medication list given to you today.  If you need a refill on your cardiac medications before your next appointment, please call your pharmacy.  Labwork: FLP AND LFT AT PCP'S OFFICE IN 4 WEEKS PLEASE FAX TO [phone]  Follow-Up: Your physician wants you to follow-up in: 2 MONTHS WITH DR HARDING.  Thank you for choosing [facility] at [facility]!!    Trish Mancinelli, PA-C [place], LPN     Hilbert Corrigan, [place]  07/20/2016 9:32 AM    [facility] [place], [place], [place]  [address] Phone: [phone]; Fax: [phone]

## 2016-07-20 ENCOUNTER — Encounter: Payer: Self-pay | Admitting: Physician Assistant

## 2016-07-22 ENCOUNTER — Telehealth (HOSPITAL_COMMUNITY): Payer: Self-pay

## 2016-07-22 NOTE — Telephone Encounter (Signed)
Patient returned call and schedule for orientation and cardiac rehab program.

## 2016-07-22 NOTE — Telephone Encounter (Signed)
I called and left message on patient voicemail to call about scheduling and participating in the cardiac rehab program. I left my contact information on patient voicemail.

## 2016-07-23 DIAGNOSIS — H35373 Puckering of macula, bilateral: Secondary | ICD-10-CM | POA: Diagnosis not present

## 2016-07-23 DIAGNOSIS — H52203 Unspecified astigmatism, bilateral: Secondary | ICD-10-CM | POA: Diagnosis not present

## 2016-07-23 DIAGNOSIS — H524 Presbyopia: Secondary | ICD-10-CM | POA: Diagnosis not present

## 2016-07-23 DIAGNOSIS — E119 Type 2 diabetes mellitus without complications: Secondary | ICD-10-CM | POA: Diagnosis not present

## 2016-07-23 DIAGNOSIS — Z961 Presence of intraocular lens: Secondary | ICD-10-CM | POA: Diagnosis not present

## 2016-07-23 DIAGNOSIS — H43393 Other vitreous opacities, bilateral: Secondary | ICD-10-CM | POA: Diagnosis not present

## 2016-07-23 DIAGNOSIS — H16223 Keratoconjunctivitis sicca, not specified as Sjogren's, bilateral: Secondary | ICD-10-CM | POA: Diagnosis not present

## 2016-07-29 NOTE — Progress Notes (Signed)
Unable to do PFT.

## 2016-07-30 ENCOUNTER — Encounter: Payer: Self-pay | Admitting: Internal Medicine

## 2016-08-14 ENCOUNTER — Other Ambulatory Visit: Payer: Self-pay | Admitting: Pulmonary Disease

## 2016-08-19 ENCOUNTER — Encounter: Payer: Self-pay | Admitting: Internal Medicine

## 2016-08-19 ENCOUNTER — Ambulatory Visit (INDEPENDENT_AMBULATORY_CARE_PROVIDER_SITE_OTHER): Payer: Medicare Other | Admitting: Internal Medicine

## 2016-08-19 VITALS — BP 126/78 | HR 72 | Ht 75.0 in | Wt 258.0 lb

## 2016-08-19 DIAGNOSIS — I480 Paroxysmal atrial fibrillation: Secondary | ICD-10-CM | POA: Diagnosis not present

## 2016-08-19 DIAGNOSIS — Z9861 Coronary angioplasty status: Secondary | ICD-10-CM

## 2016-08-19 DIAGNOSIS — I2510 Atherosclerotic heart disease of native coronary artery without angina pectoris: Secondary | ICD-10-CM | POA: Diagnosis not present

## 2016-08-19 NOTE — Progress Notes (Signed)
Electrophysiology Office Note   Date:  08/19/2016   ID:  Kenneth [name], DOB [birth year], MRN [id number]  PCP:  [facility]  Cardiologist:  Dr Berton Mount Primary Electrophysiologist: [name], MD    Chief Complaint  Patient presents with  . Atrial Fibrillation     History of Present Illness: Kenneth [name] is a 71 y.o. male who presents today for electrophysiology evaluation.   He recently presented to afib clinic with worsening SOB and fatigue.  He underwent cath and was found to have multivessel CAD.  He is s/p PCI of RCA and LAD.  He now feels "great" but does "overdo it" occasionally.  He has cardiac rehab scheduled to begin tomorrow.  I have been very clear that I think that this is important for him. Denies procedure related complications.  Today, he denies symptoms of palpitations, chest pain, shortness of breath, orthopnea, PND, lower extremity edema, claudication, dizziness, presyncope, syncope, bleeding, or neurologic sequela. The patient is tolerating medications without difficulties and is otherwise without complaint today.    Past Medical History:  Diagnosis Date  . Adenomatous polyp of colon 2006  . Anemia   . Arthritis    "qwhere" (06/28/2016)  . Coronary artery disease    06/28/16 PCI with DES--> RCA 07/10/16 PCI with DES--> LAD  . Diabetes mellitus without complication ([place])    "suppose to be on Metformin but doesn't take it" (06/28/2016)  . Diverticulosis of colon (without mention of hemorrhage) 1999, 2006  . Dyspepsia   . Epiretinal membrane    "epiretinal attachment" (06/28/2016)  . Esophagitis 1999  . GERD (gastroesophageal reflux disease)   . Heart murmur   . Hiatal hernia 1999  . History of kidney stones   . HOH (hard of hearing)    "wears aids"   . Hyperlipidemia   . Hypertension   . 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 Paroxysmal atrial fibrillation ([place])    a. 11/2003 Tikosyn initiated -  subsequently d/c'd;  b. 2006 RFCA for Afib @ [facility];  c. 09/2011 Echo: EF 60-65%, Gr 2 DD;  d. 10/2011 DCCV  . Pneumonia 1990s X 1  . Psoriasis 1967  . Rheumatic fever ~ 1958  . Seasonal allergies   . Skin cancer    on leg, left arm  . Sleep apnea    patient has had the sleep test, but states they said it didn't need treatment; "will repeat test at home" (06/28/2016)   Past Surgical History:  Procedure Laterality Date  . APPENDECTOMY  1996  . ATRIAL FIBRILLATION ABLATION N/A 01/13/2014   repeat PVI, also left atrial ablation performed with successfull ablation of LA flutter by Dr Rayann Heman  . ATRIAL FIBRILLATION ABLATION  2006   Dr Rolland Porter at [facility]  . CARDIAC CATHETERIZATION  2005  . CARDIOVERSION N/A 05/22/2013   Procedure: CARDIOVERSION;  Surgeon: [name], MD;  Location: [facility];  Service: Cardiovascular;  Laterality: N/A;  . CARDIOVERSION N/A 10/23/2011   Procedure: CARDIOVERSION;  Surgeon: Deboraha Sprang, MD;  Location: [facility] CATH LAB;  Service: Cardiovascular;  Laterality: N/A;  . CARDIOVERSION N/A 05/01/2012   Procedure: CARDIOVERSION;  Surgeon: Deboraha Sprang, MD;  Location: [facility] CATH LAB;  Service: Cardiovascular;  Laterality: N/A;  . CARPAL TUNNEL RELEASE Right 1980s?  Marland Kitchen CATARACT EXTRACTION W/ INTRAOCULAR LENS  IMPLANT, BILATERAL Bilateral   . COLONOSCOPY    . CORONARY ANGIOPLASTY WITH STENT PLACEMENT  06/28/2016  . CORONARY STENT INTERVENTION N/A 06/28/2016   Procedure: Coronary Stent Intervention;  Surgeon: Leonie Man, MD;  Location: [place] CV LAB;  Service: Cardiovascular;  Laterality: N/A;  . CORONARY STENT INTERVENTION N/A 07/10/2016   Procedure: Coronary Stent Intervention;  Surgeon: Leonie Man, MD;  Location: [place] CV LAB;  Service: Cardiovascular;  Laterality: N/A;  . CYSTOSCOPY W/ STONE MANIPULATION  2010   Patient states he has had several kidney stones up until aroune 2010, none since (06/28/2016)  . ESOPHAGOGASTRODUODENOSCOPY    . INTRAVASCULAR PRESSURE  WIRE/FFR STUDY N/A 06/28/2016   Procedure: Intravascular Pressure Wire/FFR Study;  Surgeon: Leonie Man, MD;  Location: [place] CV LAB;  Service: Cardiovascular;  Laterality: N/A;  . NASAL SINUS SURGERY     x 2  . RIGHT/LEFT HEART CATH AND CORONARY ANGIOGRAPHY N/A 06/28/2016   Procedure: Right/Left Heart Cath and Coronary Angiography;  Surgeon: Leonie Man, MD;  Location: [place] [facility] LAB;  Service: Cardiovascular;  Laterality: N/A;  . SHOULDER OPEN ROTATOR CUFF REPAIR Bilateral   . TONSILLECTOMY       Current Outpatient Prescriptions  Medication Sig Dispense Refill  . acetaminophen (TYLENOL) 500 MG tablet Take 1,000 mg by mouth every 6 (six) hours as needed (for pain.).    . Adalimumab 40 MG/0.8ML PNKT Inject 40 mg into the skin every 14 (fourteen) days. Fridays    . aspirin 81 MG chewable tablet Chew 1 tablet (81 mg total) by mouth daily. 30 tablet 6  . Biotin 1000 MCG tablet Take 1,000 mcg by mouth at bedtime.    . calcipotriene (DOVONOX) 0.005 % cream Apply 1 application topically 2 (two) times daily as needed (for psoriasis.).    Marland Kitchen carvedilol (COREG) 3.125 MG tablet Take 1 tablet (3.125 mg total) by mouth 2 (two) times daily with a meal. 60 tablet 6  . cetirizine (ZYRTEC) 10 MG tablet Take 10 mg by mouth daily.    . clobetasol cream (TEMOVATE) 5.97 % Apply 1 application topically 2 (two) times daily as needed (for psoriasis).    . clopidogrel (PLAVIX) 75 MG tablet Take 1 tablet (75 mg total) by mouth daily with breakfast. 30 tablet 6  . diltiazem (CARTIA XT) 120 MG 24 hr capsule Take 1 capsule (120 mg total) by mouth daily. 30 capsule 9  . Flurandrenolide (CORDRAN) 4 MCG/SQCM TAPE Apply 1 each topically at bedtime. Applied to areas affected by psoriasis.    Javier Docker Oil 500 MG CAPS Take 500 mg by mouth at bedtime.    . magnesium oxide (MAG-OX) 400 MG tablet Take 400 mg by mouth daily.    . metFORMIN (GLUMETZA) 500 MG (MOD) 24 hr tablet Take 1 tablet (500 mg total) by mouth  daily with breakfast. 30 tablet 3  . nitroGLYCERIN (NITROSTAT) 0.4 MG SL tablet Place 1 tablet (0.4 mg total) under the tongue every 5 (five) minutes as needed for chest pain. 25 tablet 12  . pantoprazole (PROTONIX) 40 MG tablet Take 1 tablet (40 mg total) by mouth daily. 30 tablet 6  . Polyethyl Glycol-Propyl Glycol (SYSTANE) 0.4-0.3 % GEL ophthalmic gel Place 1 application into both eyes at bedtime.    . pravastatin (PRAVACHOL) 40 MG tablet Take 40 mg by mouth at bedtime.    . predniSONE (DELTASONE) 5 MG tablet TAKE ONE TABLET BY MOUTH ONCE DAILY WITH BREAKFAST 60 tablet 1  . Probiotic Product ([place]) CAPS Take 1 capsule by mouth at bedtime.    Marland Kitchen  zolpidem (AMBIEN) 10 MG tablet Take 5-10 mg by mouth at bedtime as needed for sleep.     No current facility-administered medications for this visit.    Facility-Administered Medications Ordered in Other Visits  Medication Dose Route Frequency Provider Last Rate Last Dose  . lidocaine (cardiac) 100 mg/35ml (XYLOCAINE) 20 MG/ML injection 2%    Anesthesia Intra-op Flowers, Rokoshi T, CRNA   60 mg at 03/25/13 1617  . propofol (DIPRIVAN) 10 mg/mL bolus/IV push    Anesthesia Intra-op Flowers, Rokoshi T, CRNA   90 mg at 03/25/13 1617    Allergies:   Hydrocodone-acetaminophen; Other; Oxycodone-acetaminophen; Pseudoephedrine; and Tramadol   Social History:  The patient  reports that he quit smoking about 14 years ago. His smoking use included Cigars. He quit after 10.00 years of use. He quit smokeless tobacco use about 14 years ago. His smokeless tobacco use included Snuff and Chew. He reports that he does not drink alcohol or use drugs.   Family History:  The patient's  family history includes Breast cancer in his mother; Colon cancer in his mother; Coronary artery disease in his mother and paternal grandfather; Heart disease in his maternal grandmother; Stroke in his paternal grandfather.    ROS:  Please see the history of present illness.    All other systems are personally reviewed and negative.    PHYSICAL EXAM: VS:  BP 126/78   Pulse 72   Ht 6\' 3"  (1.905 m)   Wt 258 lb (117 kg)   SpO2 97%   BMI 32.25 kg/m  , BMI Body mass index is 32.25 kg/m. GEN: Well nourished, well developed, in no acute distress  HEENT: normal  Neck: no JVD, carotid bruits, or masses Cardiac: RRR; no murmurs, rubs, or gallops,no edema  Respiratory:  clear to auscultation bilaterally, normal work of breathing GI: soft, nontender, nondistended, + BS MS: no deformity or atrophy  Skin: warm and dry  Neuro:  Strength and sensation are intact Psych: euthymic mood, full affect  EKG:  EKG is ordered today. The ekg tracing ordered today is personally reviewed and shows sinus rhythm with PACs, otherwise normal ekg   Recent Labs: 06/25/2016: TSH 1.018 06/29/2016: Hemoglobin 12.2 07/08/2016: ALT 60; BUN 20; Creatinine, Ser 1.07; Platelets 261; Potassium 4.8; Sodium 135  personally reviewed   Lipid Panel     Component Value Date/Time   CHOL (H) 08/17/2009 0444    246        ATP III CLASSIFICATION:  <200     mg/dL   Desirable  200-239  mg/dL   Borderline High  >=240    mg/dL   High          TRIG 866 (H) 08/17/2009 0444   HDL 26 (L) 08/17/2009 0444   CHOLHDL 9.5 08/17/2009 0444   VLDL UNABLE TO CALCULATE IF TRIGLYCERIDE OVER 400 mg/dL 08/17/2009 0444   LDLCALC  08/17/2009 0444    UNABLE TO CALCULATE IF TRIGLYCERIDE OVER 400 mg/dL        Total Cholesterol/HDL:CHD Risk Coronary Heart Disease Risk Table                     Men   Women  1/2 Average Risk   3.4   3.3  Average Risk       5.0   4.4  2 X Average Risk   9.6   7.1  3 X Average Risk  23.4   11.0        Use  the calculated Patient Ratio above and the CHD Risk Table to determine the patient's CHD Risk.        ATP III CLASSIFICATION (LDL):  <100     mg/dL   Optimal  100-129  mg/dL   Near or Above                    Optimal  130-159  mg/dL   Borderline  160-189  mg/dL   High  >190      mg/dL   Very High   personally reviewed   Wt Readings from Last 3 Encounters:  08/19/16 258 lb (117 kg)  07/19/16 263 lb 9.6 oz (119.6 kg)  07/10/16 254 lb (115.2 kg)      Other studies personally reviewed: Additional studies/ records that were reviewed today include: Cath, AF clinic notes  Review of the above records today demonstrates: as above   ASSESSMENT AND PLAN:  1.  CAD Recently diagnosed Doing well s/p PCI Will need aggressive lifestyle modification and lipid lowering treatment Will follow-up with Dr Glennon Mac for echo  2. Afib/ atypical atrial flutter No symptoms of arrhythmia currently chads2vasc score is at least 3 I have advised NOAC therapy however he is reluctant at this time. Can be started by Dr Ellyn Hack once his antiplately regimen can be reduced Overdue for echo  Follow-up with Dr Ellyn Hack in 3 months I will see again in 6 months  Current medicines are reviewed at length with the patient today.   The patient does not have concerns regarding his medicines.  The following changes were made today:  none  Labs/ tests ordered today include:  Orders Placed This Encounter  Procedures  . EKG 12-Lead     Signed, [name], MD  08/19/2016 9:39 AM     [facility] [address] [place] [address] [phone] (office) [phone] (fax)

## 2016-08-19 NOTE — Patient Instructions (Signed)
Medication Instructions:  Your physician recommends that you continue on your current medications as directed. Please refer to the Current Medication list given to you today.   Follow-Up: Your physician recommends that you schedule a follow-up appointment in: 3 months with Dr. Ellyn Hack.  Your physician wants you to follow-up in: 6 months with Dr. Rayann Heman.  You will receive a reminder letter in the mail two months in advance. If you don't receive a letter, please call our office to schedule the follow-up appointment.    Any Other Special Instructions Will Be Listed Below (If Applicable).     If you need a refill on your cardiac medications before your next appointment, please call your pharmacy.

## 2016-08-20 ENCOUNTER — Encounter (HOSPITAL_COMMUNITY): Payer: Self-pay

## 2016-08-20 ENCOUNTER — Encounter (HOSPITAL_COMMUNITY)
Admission: RE | Admit: 2016-08-20 | Discharge: 2016-08-20 | Disposition: A | Discharge status: Home / Self Care | Payer: Medicare Other | Source: Ambulatory Visit | Attending: Cardiology | Admitting: Cardiology

## 2016-08-20 VITALS — BP 142/80 | HR 70 | Ht 73.75 in | Wt 260.1 lb

## 2016-08-20 DIAGNOSIS — Z955 Presence of coronary angioplasty implant and graft: Secondary | ICD-10-CM | POA: Diagnosis present

## 2016-08-20 DIAGNOSIS — Z9861 Coronary angioplasty status: Secondary | ICD-10-CM | POA: Insufficient documentation

## 2016-08-20 NOTE — Progress Notes (Signed)
Cardiac Individual Treatment Plan  Patient Details  Name: Kenneth [name] MRN: [id number] Date of Birth: 23-Oct-1945 Referring Provider:     CARDIAC REHAB PHASE II ORIENTATION from 08/20/2016 in [facility]  Referring Provider  [name] MD, and [name] MD      Initial Encounter Date:    CARDIAC REHAB PHASE II ORIENTATION from 08/20/2016 in [facility]  Date  08/20/16  Referring Provider  [name] MD, and [name] MD      Visit Diagnosis: 06/28/16 and 07/10/16 Stented coronary artery  Patient's Home Medications on Admission:  Current Outpatient Prescriptions:  .  acetaminophen (TYLENOL) 500 MG tablet, Take 1,000 mg by mouth every 6 (six) hours as needed (for pain.)., Disp: , Rfl:  .  aspirin 81 MG chewable tablet, Chew 1 tablet (81 mg total) by mouth daily., Disp: 30 tablet, Rfl: 6 .  Biotin 1000 MCG tablet, Take 1,000 mcg by mouth at bedtime., Disp: , Rfl:  .  calcipotriene (DOVONOX) 0.005 % cream, Apply 1 application topically 2 (two) times daily as needed (for psoriasis.)., Disp: , Rfl:  .  carvedilol (COREG) 3.125 MG tablet, Take 1 tablet (3.125 mg total) by mouth 2 (two) times daily with a meal., Disp: 60 tablet, Rfl: 6 .  cetirizine (ZYRTEC) 10 MG tablet, Take 10 mg by mouth daily., Disp: , Rfl:  .  clobetasol cream (TEMOVATE) 8.29 %, Apply 1 application topically 2 (two) times daily as needed (for psoriasis)., Disp: , Rfl:  .  clopidogrel (PLAVIX) 75 MG tablet, Take 1 tablet (75 mg total) by mouth daily with breakfast., Disp: 30 tablet, Rfl: 6 .  diltiazem (CARTIA XT) 120 MG 24 hr capsule, Take 1 capsule (120 mg total) by mouth daily., Disp: 30 capsule, Rfl: 9 .  Flurandrenolide (CORDRAN) 4 MCG/SQCM TAPE, Apply 1 each topically at bedtime. Applied to areas affected by psoriasis., Disp: , Rfl:  .  Krill Oil 500 MG CAPS, Take 500 mg by mouth at bedtime., Disp: , Rfl:  .  magnesium oxide (MAG-OX) 400 MG  tablet, Take 400 mg by mouth daily., Disp: , Rfl:  .  metFORMIN (GLUMETZA) 500 MG (MOD) 24 hr tablet, Take 1 tablet (500 mg total) by mouth daily with breakfast., Disp: 30 tablet, Rfl: 3 .  nitroGLYCERIN (NITROSTAT) 0.4 MG SL tablet, Place 1 tablet (0.4 mg total) under the tongue every 5 (five) minutes as needed for chest pain., Disp: 25 tablet, Rfl: 12 .  pantoprazole (PROTONIX) 40 MG tablet, Take 1 tablet (40 mg total) by mouth daily., Disp: 30 tablet, Rfl: 6 .  Polyethyl Glycol-Propyl Glycol (SYSTANE) 0.4-0.3 % GEL ophthalmic gel, Place 1 application into both eyes at bedtime., Disp: , Rfl:  .  pravastatin (PRAVACHOL) 40 MG tablet, Take 40 mg by mouth at bedtime., Disp: , Rfl:  .  predniSONE (DELTASONE) 5 MG tablet, TAKE ONE TABLET BY MOUTH ONCE DAILY WITH BREAKFAST, Disp: 60 tablet, Rfl: 1 .  Probiotic Product ([place]) CAPS, Take 1 capsule by mouth at bedtime., Disp: , Rfl:  .  Ustekinumab 45 MG/0.5ML SOLN, Inject 45 mg into the skin every 3 (three) months. Taking every month Right now for 1 more dose, Disp: , Rfl:  .  zolpidem (AMBIEN) 10 MG tablet, Take 5-10 mg by mouth at bedtime as needed for sleep., Disp: , Rfl:  No current facility-administered medications for this encounter.   Facility-Administered Medications Ordered in Other Encounters:  .  lidocaine (cardiac)  100 mg/10ml (XYLOCAINE) 20 MG/ML injection 2%, , , Anesthesia Intra-op, Flowers, Rokoshi T, CRNA, 60 mg at 03/25/13 1617 .  propofol (DIPRIVAN) 10 mg/mL bolus/IV push, , , Anesthesia Intra-op, Flowers, Rokoshi T, CRNA, 90 mg at 03/25/13 1617  Past Medical History: Past Medical History:  Diagnosis Date  . Adenomatous polyp of colon 2006  . Anemia   . Arthritis    "qwhere" (06/28/2016)  . Coronary artery disease    06/28/16 PCI with DES--> RCA 07/10/16 PCI with DES--> LAD  . Diabetes mellitus without complication ([place])    "suppose to be on Metformin but doesn't take it" (06/28/2016)  . Diverticulosis of colon  (without mention of hemorrhage) 1999, 2006  . Dyspepsia   . Epiretinal membrane    "epiretinal attachment" (06/28/2016)  . Esophagitis 1999  . GERD (gastroesophageal reflux disease)   . Heart murmur   . Hiatal hernia 1999  . History of kidney stones   . HOH (hard of hearing)    "wears aids"   . Hyperlipidemia   . Hypertension   . 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 Paroxysmal atrial fibrillation ([place])    a. 11/2003 Tikosyn initiated - subsequently d/c'd;  b. 2006 RFCA for Afib @ [facility];  c. 09/2011 Echo: EF 60-65%, Gr 2 DD;  d. 10/2011 DCCV  . Pneumonia 1990s X 1  . Psoriasis 1967  . Rheumatic fever ~ 1958  . Seasonal allergies   . Skin cancer    on leg, left arm  . Sleep apnea    patient has had the sleep test, but states they said it didn't need treatment; "will repeat test at home" (06/28/2016)    Tobacco Use: History  Smoking Status  . Former Smoker  . Years: 10.00  . Types: Cigars  . Quit date: 09/29/2001  Smokeless Tobacco  . Former Systems developer  . Types: Snuff, Chew  . Quit date: 09/29/2001    Labs: Recent Review Flowsheet Data    Labs for ITP Cardiac and Pulmonary Rehab Latest Ref Rng & Units 08/17/2009 02/21/2011 06/28/2016 06/28/2016 06/29/2016   Cholestrol 0 - 200 mg/dL 246 ATP III CLASSIFICATION: <200     mg/dL   Desirable 200-239  mg/dL   Borderline High >=240    mg/dL   High(H) - - - -   LDLCALC 0 - 99 mg/dL UNABLE TO CALCULATE IF TRIGLYCERIDE OVER 400 mg/dL Total Cholesterol/HDL:CHD Risk Coronary Heart Disease Risk Table Men   Women 1/2 Average Risk   3.4   3.3 Average Risk       5.0   4.4 2 X Average Risk   9.6   7.1 3 X Average Risk  23.4   11.0 Use the calculated Patient Ratio above and the CHD Risk Table to determine the patient's CHD Risk. ATP III CLASSIFICATION (LDL): <100     mg/dL   Optimal 100-129  mg/dL   Near or Above Optimal 130-159  mg/dL   Borderline 160-189  mg/dL   High >190     mg/dL   Very High - - - -    HDL >39 mg/dL 26(L) - - - -   Trlycerides <150 mg/dL 866(H) - - - -   Hemoglobin A1c 4.8 - 5.6 % - 6.5 - - 7.5(H)   PHART 7.350 - 7.450 - - 7.426 - -   PCO2ART 32.0 - 48.0 mmHg - - 33.0 - -   HCO3 20.0 - 28.0  mmol/L - - 21.7 23.6 -   TCO2 0 - 100 mmol/L 27 - 23 25 -   ACIDBASEDEF 0.0 - 2.0 mmol/L - - 2.0 1.0 -   O2SAT % - - 99.0 73.0 -      Capillary Blood Glucose: Lab Results  Component Value Date   GLUCAP 131 (H) 07/10/2016   GLUCAP 111 (H) 07/10/2016   GLUCAP 135 (H) 06/29/2016   GLUCAP 191 (H) 06/28/2016   GLUCAP 176 (H) 06/28/2016     Exercise Target Goals: Date: 08/20/16  Exercise Program Goal: Individual exercise prescription set with THRR, safety & activity barriers. Participant demonstrates ability to understand and report RPE using BORG scale, to self-measure pulse accurately, and to acknowledge the importance of the exercise prescription.  Exercise Prescription Goal: Starting with aerobic activity 30 plus minutes a day, 3 days per week for initial exercise prescription. Provide home exercise prescription and guidelines that participant acknowledges understanding prior to discharge.  Activity Barriers & Risk Stratification:     Activity Barriers & Cardiac Risk Stratification - 08/20/16 0820      Activity Barriers & Cardiac Risk Stratification   Activity Barriers Deconditioning;Shortness of Breath;Muscular Weakness   Cardiac Risk Stratification High      6 Minute Walk:     6 Minute Walk    Row Name 08/20/16 1034         6 Minute Walk   Phase Initial     Distance 1371 feet     Walk Time 6 minutes     # of Rest Breaks 0     MPH 2.59     METS 3.02     RPE 11     VO2 Peak 10.56     Symptoms No     Resting HR 70 bpm     Resting BP 142/80     Max Ex. HR 103 bpm     Max Ex. BP 176/76     2 Minute Post BP 130/80        Oxygen Initial Assessment:   Oxygen Re-Evaluation:   Oxygen Discharge (Final Oxygen Re-Evaluation):   Initial Exercise  Prescription:     Initial Exercise Prescription - 08/20/16 1100      Date of Initial Exercise RX and Referring Provider   Date 08/20/16   Referring Provider [name] MD, and [name] MD     Treadmill   MPH 2.3   Grade 1   Minutes 10   METs 3.08     Bike   Level 0.8   Minutes 10   METs 1.79     NuStep   Level 3   SPM 70   Minutes 10   METs 2     Prescription Details   Frequency (times per week) 3   Duration Progress to 30 minutes of continuous aerobic without signs/symptoms of physical distress     Intensity   THRR 40-80% of Max Heartrate 60-119   Ratings of Perceived Exertion 11-13   Perceived Dyspnea 0-4     Progression   Progression Continue to progress workloads to maintain intensity without signs/symptoms of physical distress.     Resistance Training   Training Prescription Yes   Weight 3lbs   Reps 10-15      Perform Capillary Blood Glucose checks as needed.  Exercise Prescription Changes:   Exercise Comments:   Exercise Goals and Review:     Exercise Goals    Row Name 08/20/16 [phone]  Exercise Goals   Increase Physical Activity Yes       Intervention Provide advice, education, support and counseling about physical activity/exercise needs.;Develop an individualized exercise prescription for aerobic and resistive training based on initial evaluation findings, risk stratification, comorbidities and participant's personal goals.       Expected Outcomes Achievement of increased cardiorespiratory fitness and enhanced flexibility, muscular endurance and strength shown through measurements of functional capacity and personal statement of participant.       Increase Strength and Stamina Yes       Intervention Provide advice, education, support and counseling about physical activity/exercise needs.;Develop an individualized exercise prescription for aerobic and resistive training based on initial evaluation findings, risk  stratification, comorbidities and participant's personal goals.       Expected Outcomes Achievement of increased cardiorespiratory fitness and enhanced flexibility, muscular endurance and strength shown through measurements of functional capacity and personal statement of participant.          Exercise Goals Re-Evaluation :    Discharge Exercise Prescription (Final Exercise Prescription Changes):   Nutrition:  Target Goals: Understanding of nutrition guidelines, daily intake of sodium 1500mg , cholesterol 200mg , calories 30% from fat and 7% or less from saturated fats, daily to have 5 or more servings of fruits and vegetables.  Biometrics:     Pre Biometrics - 08/20/16 1034      Pre Biometrics   Height 6' 1.75" (1.873 m)   Weight 260 lb 2.3 oz (118 kg)   Waist Circumference 46 inches   Hip Circumference 44 inches   Waist to Hip Ratio 1.05 %   BMI (Calculated) 33.7   Triceps Skinfold 25 mm   % Body Fat 39.1 %   Grip Strength 47 kg   Flexibility 13 in   Single Leg Stand 8.24 seconds       Nutrition Therapy Plan and Nutrition Goals:   Nutrition Discharge: Nutrition Scores:   Nutrition Goals Re-Evaluation:   Nutrition Goals Re-Evaluation:   Nutrition Goals Discharge (Final Nutrition Goals Re-Evaluation):   Psychosocial: Target Goals: Acknowledge presence or absence of significant depression and/or stress, maximize coping skills, provide positive support system. Participant is able to verbalize types and ability to use techniques and skills needed for reducing stress and depression.  Initial Review & Psychosocial Screening:   Quality of Life Scores:   PHQ-9: Recent Review Flowsheet Data    Depression screen [facility] 2/9 07/18/2014   Decreased Interest 0   Down, Depressed, Hopeless 0   PHQ - 2 Score 0     Interpretation of Total Score  Total Score Depression Severity:  1-4 = Minimal depression, 5-9 = Mild depression, 10-14 = Moderate depression, 15-19 =  Moderately severe depression, 20-27 = Severe depression   Psychosocial Evaluation and Intervention:   Psychosocial Re-Evaluation:   Psychosocial Discharge (Final Psychosocial Re-Evaluation):   Vocational Rehabilitation: Provide vocational rehab assistance to qualifying candidates.   Vocational Rehab Evaluation & Intervention:   Education: Education Goals: Education classes will be provided on a weekly basis, covering required topics. Participant will state understanding/return demonstration of topics presented.  Learning Barriers/Preferences:     Learning Barriers/Preferences - 08/20/16 0819      Learning Barriers/Preferences   Learning Barriers Hearing;Sight  cataract surgery   Learning Preferences Written Material      Education Topics: Count Your Pulse:  -Group instruction provided by verbal instruction, demonstration, patient participation and written materials to support subject.  Instructors address importance of being able to find your pulse and how  to count your pulse when at home without a heart monitor.  Patients get hands on experience counting their pulse with staff help and individually.   Heart Attack, Angina, and Risk Factor Modification:  -Group instruction provided by verbal instruction, video, and written materials to support subject.  Instructors address signs and symptoms of angina and heart attacks.    Also discuss risk factors for heart disease and how to make changes to improve heart health risk factors.   Functional Fitness:  -Group instruction provided by verbal instruction, demonstration, patient participation, and written materials to support subject.  Instructors address safety measures for doing things around the house.  Discuss how to get up and down off the floor, how to pick things up properly, how to safely get out of a chair without assistance, and balance training.   Meditation and Mindfulness:  -Group instruction provided by verbal  instruction, patient participation, and written materials to support subject.  Instructor addresses importance of mindfulness and meditation practice to help reduce stress and improve awareness.  Instructor also leads participants through a meditation exercise.    Stretching for Flexibility and Mobility:  -Group instruction provided by verbal instruction, patient participation, and written materials to support subject.  Instructors lead participants through series of stretches that are designed to increase flexibility thus improving mobility.  These stretches are additional exercise for major muscle groups that are typically performed during regular warm up and cool down.   Hands Only CPR Anytime:  -Group instruction provided by verbal instruction, video, patient participation and written materials to support subject.  Instructors co-teach with AHA video for hands only CPR.  Participants get hands on experience with mannequins.   Nutrition I class: Heart Healthy Eating:  -Group instruction provided by PowerPoint slides, verbal discussion, and written materials to support subject matter. The instructor gives an explanation and review of the Therapeutic Lifestyle Changes diet recommendations, which includes a discussion on lipid goals, dietary fat, sodium, fiber, plant stanol/sterol esters, sugar, and the components of a well-balanced, healthy diet.   Nutrition II class: Lifestyle Skills:  -Group instruction provided by PowerPoint slides, verbal discussion, and written materials to support subject matter. The instructor gives an explanation and review of label reading, grocery shopping for heart health, heart healthy recipe modifications, and ways to make healthier choices when eating out.   Diabetes Question & Answer:  -Group instruction provided by PowerPoint slides, verbal discussion, and written materials to support subject matter. The instructor gives an explanation and review of diabetes  co-morbidities, pre- and post-prandial blood glucose goals, pre-exercise blood glucose goals, signs, symptoms, and treatment of hypoglycemia and hyperglycemia, and foot care basics.   Diabetes Blitz:  -Group instruction provided by PowerPoint slides, verbal discussion, and written materials to support subject matter. The instructor gives an explanation and review of the physiology behind type 1 and type 2 diabetes, diabetes medications and rational behind using different medications, pre- and post-prandial blood glucose recommendations and Hemoglobin A1c goals, diabetes diet, and exercise including blood glucose guidelines for exercising safely.    Portion Distortion:  -Group instruction provided by PowerPoint slides, verbal discussion, written materials, and food models to support subject matter. The instructor gives an explanation of serving size versus portion size, changes in portions sizes over the last 20 years, and what consists of a serving from each food group.   Stress Management:  -Group instruction provided by verbal instruction, video, and written materials to support subject matter.  Instructors review role of stress in  heart disease and how to cope with stress positively.     Exercising on Your Own:  -Group instruction provided by verbal instruction, power point, and written materials to support subject.  Instructors discuss benefits of exercise, components of exercise, frequency and intensity of exercise, and end points for exercise.  Also discuss use of nitroglycerin and activating EMS.  Review options of places to exercise outside of rehab.  Review guidelines for sex with heart disease.   Cardiac Drugs I:  -Group instruction provided by verbal instruction and written materials to support subject.  Instructor reviews cardiac drug classes: antiplatelets, anticoagulants, beta blockers, and statins.  Instructor discusses reasons, side effects, and lifestyle considerations for each  drug class.   Cardiac Drugs II:  -Group instruction provided by verbal instruction and written materials to support subject.  Instructor reviews cardiac drug classes: angiotensin converting enzyme inhibitors (ACE-I), angiotensin II receptor blockers (ARBs), nitrates, and calcium channel blockers.  Instructor discusses reasons, side effects, and lifestyle considerations for each drug class.   Anatomy and Physiology of the Circulatory System:  -Group instruction provided by verbal instruction, video, and written materials to support subject.  Reviews functional anatomy of heart, how it relates to various diagnoses, and what role the heart plays in the overall system.   Knowledge Questionnaire Score:   Core Components/Risk Factors/Patient Goals at Admission:     Personal Goals and Risk Factors at Admission - 08/20/16 1114      Core Components/Risk Factors/Patient Goals on Admission    Weight Management Yes;Obesity;Weight Loss;Weight Maintenance   Intervention Weight Management: Develop a combined nutrition and exercise program designed to reach desired caloric intake, while maintaining appropriate intake of nutrient and fiber, sodium and fats, and appropriate energy expenditure required for the weight goal.;Weight Management: Provide education and appropriate resources to help participant work on and attain dietary goals.;Weight Management/Obesity: Establish reasonable short term and long term weight goals.;Obesity: Provide education and appropriate resources to help participant work on and attain dietary goals.   Expected Outcomes Short Term: Continue to assess and modify interventions until short term weight is achieved;Long Term: Adherence to nutrition and physical activity/exercise program aimed toward attainment of established weight goal;Weight Maintenance: Understanding of the daily nutrition guidelines, which includes 25-35% calories from fat, 7% or less cal from saturated fats, less than  200mg  cholesterol, less than 1.5gm of sodium, & 5 or more servings of fruits and vegetables daily;Weight Loss: Understanding of general recommendations for a balanced deficit meal plan, which promotes 1-2 lb weight loss per week and includes a negative energy balance of [phone] kcal/d;Understanding recommendations for meals to include 15-35% energy as protein, 25-35% energy from fat, 35-60% energy from carbohydrates, less than 200mg  of dietary cholesterol, 20-35 gm of total fiber daily;Understanding of distribution of calorie intake throughout the day with the consumption of 4-5 meals/snacks   Diabetes Yes   Intervention Provide education about signs/symptoms and action to take for hypo/hyperglycemia.;Provide education about proper nutrition, including hydration, and aerobic/resistive exercise prescription along with prescribed medications to achieve blood glucose in normal ranges: Fasting glucose 65-99 mg/dL   Expected Outcomes Short Term: Participant verbalizes understanding of the signs/symptoms and immediate care of hyper/hypoglycemia, proper foot care and importance of medication, aerobic/resistive exercise and nutrition plan for blood glucose control.;Long Term: Attainment of HbA1C < 7%.   Hypertension Yes   Intervention Provide education on lifestyle modifcations including regular physical activity/exercise, weight management, moderate sodium restriction and increased consumption of fresh fruit, vegetables, and low fat dairy, alcohol  moderation, and smoking cessation.;Monitor prescription use compliance.   Expected Outcomes Short Term: Continued assessment and intervention until BP is < 140/34mm HG in hypertensive participants. < 130/75mm HG in hypertensive participants with diabetes, heart failure or chronic kidney disease.;Long Term: Maintenance of blood pressure at goal levels.   Lipids Yes   Intervention Provide education and support for participant on nutrition & aerobic/resistive exercise along  with prescribed medications to achieve LDL 70mg , HDL >40mg .   Expected Outcomes Short Term: Participant states understanding of desired cholesterol values and is compliant with medications prescribed. Participant is following exercise prescription and nutrition guidelines.;Long Term: Cholesterol controlled with medications as prescribed, with individualized exercise RX and with personalized nutrition plan. Value goals: LDL < 70mg , HDL > 40 mg.      Core Components/Risk Factors/Patient Goals Review:    Core Components/Risk Factors/Patient Goals at Discharge (Final Review):    ITP Comments:     ITP Comments    Row Name 08/20/16 0802           ITP Comments Dr. Fransico Him, Medical Director           Comments: Shawna  attended orientation from 0800 to 1000 to review rules and guidelines for program. Completed 6 minute walk test, Intitial ITP, and exercise prescription.  VSS. Telemetry-Sinus Rhythm.  Asymptomatic.[name], RN,BSN 08/20/2016 2:44 PM

## 2016-08-20 NOTE — Progress Notes (Signed)
Cardiac Rehab Medication Review by a Pharmacist  Does the patient  feel that his/her medications are working for him/her?  yes  Has the patient been experiencing any side effects to the medications prescribed?  no  Does the patient measure his/her own blood pressure or blood glucose at home?  yes   Does the patient have any problems obtaining medications due to transportation or finances?   no  Understanding of regimen: good Understanding of indications: good Potential of compliance: good    Pharmacist comments: Kenneth [name] is a pleasant 71 yo male who presents to cardiac rehab without assistance. He regularly checks his blood pressure and blood glucose at home with his wife. We discussed his change in psoriasis medications, the need for his metformin and potential triple anticoagulant therapy for afib.     Ihor Austin, PharmD PGY1 Pharmacy Resident Pager: [phone] 08/20/2016 8:47 AM

## 2016-08-26 ENCOUNTER — Encounter (HOSPITAL_COMMUNITY)
Admission: RE | Admit: 2016-08-26 | Discharge: 2016-08-26 | Disposition: A | Discharge status: Home / Self Care | Payer: Medicare Other | Source: Ambulatory Visit | Attending: Cardiology | Admitting: Cardiology

## 2016-08-26 DIAGNOSIS — Z955 Presence of coronary angioplasty implant and graft: Secondary | ICD-10-CM

## 2016-08-26 DIAGNOSIS — Z9861 Coronary angioplasty status: Secondary | ICD-10-CM | POA: Diagnosis not present

## 2016-08-26 LAB — GLUCOSE, CAPILLARY: GLUCOSE-CAPILLARY: 139 mg/dL — AB (ref 65–99)

## 2016-08-26 NOTE — Progress Notes (Signed)
Daily Session Note  Patient Details  Name: Kenneth [name] MRN: [id number] Date of Birth: 1945/08/08 Referring Provider:     CARDIAC REHAB PHASE II ORIENTATION from 08/20/2016 in [place]  Referring Provider  [name] MD, and [name] MD      Encounter Date: 08/26/2016  Check In:     Session Check In - 08/26/16 1508      Check-In   Location [facility]   Staff Present Su Hilt, MS, ACSM RCEP, Exercise Physiologist;Ashley Armstrong, MS, Exercise Physiologist;Thai Burgueno, RN, BSN   Supervising physician immediately available to respond to emergencies [facility] immediately available   Physician(s) Dr. Broadus John   Medication changes reported     No   Fall or balance concerns reported    No   Tobacco Cessation No Change   Warm-up and Cool-down Performed as group-led instruction   Resistance Training Performed Yes   VAD Patient? No     Pain Assessment   Currently in Pain? No/denies   Multiple Pain Sites No      Capillary Blood Glucose: Results for orders placed or performed during the hospital encounter of 08/26/16 (from the past 24 hour(s))  Glucose, capillary     Status: Abnormal   Collection Time: 08/26/16  3:48 PM  Result Value Ref Range   Glucose-Capillary 139 (H) 65 - 99 mg/dL      History  Smoking Status  . Former Smoker  . Years: 10.00  . Types: Cigars  . Quit date: 09/29/2001  Smokeless Tobacco  . Former Systems developer  . Types: Snuff, Chew  . Quit date: 09/29/2001    Goals Met:  Exercise tolerated well  Goals Unmet:  Not Applicable  Comments: [name] started cardiac rehab today.  Pt tolerated light exercise without difficulty. VSS, telemetry-Sinus Rhythm, asymptomatic.  Medication list reconciled. Pt denies barriers to medicaiton compliance.  PSYCHOSOCIAL ASSESSMENT:  PHQ-0. Pt exhibits positive coping skills, hopeful outlook with supportive family. No psychosocial needs identified at this  time, no psychosocial interventions necessary.    Pt enjoys antiques and woodworking.   Pt oriented to exercise equipment and routine.    Understanding verbalized. [name], RN,BSN 08/26/2016 4:38 PM   Dr. Fransico Him is Medical Director for Cardiac Rehab at [facility].

## 2016-08-27 NOTE — Progress Notes (Signed)
Cardiac Individual Treatment Plan  Patient Details  Name: Kenneth [name] MRN: [id number] Date of Birth: 20-May-1945 Referring Provider:     CARDIAC REHAB PHASE II ORIENTATION from 08/20/2016 in [place]  Referring Provider  [name] MD, and [name] MD      Initial Encounter Date:    CARDIAC REHAB PHASE II ORIENTATION from 08/20/2016 in [place]  Date  08/20/16  Referring Provider  [name] MD, and [name] MD      Visit Diagnosis: 06/28/16 and 07/10/16 Stented coronary artery  Patient's Home Medications on Admission:  Current Outpatient Prescriptions:  .  acetaminophen (TYLENOL) 500 MG tablet, Take 1,000 mg by mouth every 6 (six) hours as needed (for pain.)., Disp: , Rfl:  .  aspirin 81 MG chewable tablet, Chew 1 tablet (81 mg total) by mouth daily., Disp: 30 tablet, Rfl: 6 .  Biotin 1000 MCG tablet, Take 1,000 mcg by mouth at bedtime., Disp: , Rfl:  .  carvedilol (COREG) 3.125 MG tablet, Take 1 tablet (3.125 mg total) by mouth 2 (two) times daily with a meal., Disp: 60 tablet, Rfl: 6 .  cetirizine (ZYRTEC) 10 MG tablet, Take 10 mg by mouth daily., Disp: , Rfl:  .  clobetasol cream (TEMOVATE) 3.26 %, Apply 1 application topically 2 (two) times daily as needed (for psoriasis)., Disp: , Rfl:  .  clopidogrel (PLAVIX) 75 MG tablet, Take 1 tablet (75 mg total) by mouth daily with breakfast., Disp: 30 tablet, Rfl: 6 .  diltiazem (CARTIA XT) 120 MG 24 hr capsule, Take 1 capsule (120 mg total) by mouth daily., Disp: 30 capsule, Rfl: 9 .  Flurandrenolide (CORDRAN) 4 MCG/SQCM TAPE, Apply 1 each topically at bedtime. Applied to areas affected by psoriasis., Disp: , Rfl:  .  Krill Oil 500 MG CAPS, Take 500 mg by mouth at bedtime., Disp: , Rfl:  .  magnesium oxide (MAG-OX) 400 MG tablet, Take 400 mg by mouth daily., Disp: , Rfl:  .  metFORMIN (GLUMETZA) 500 MG (MOD) 24 hr tablet, Take 1 tablet (500 mg total) by  mouth daily with breakfast., Disp: 30 tablet, Rfl: 3 .  nitroGLYCERIN (NITROSTAT) 0.4 MG SL tablet, Place 1 tablet (0.4 mg total) under the tongue every 5 (five) minutes as needed for chest pain., Disp: 25 tablet, Rfl: 12 .  pantoprazole (PROTONIX) 40 MG tablet, Take 1 tablet (40 mg total) by mouth daily., Disp: 30 tablet, Rfl: 6 .  Polyethyl Glycol-Propyl Glycol (SYSTANE) 0.4-0.3 % GEL ophthalmic gel, Place 1 application into both eyes at bedtime., Disp: , Rfl:  .  pravastatin (PRAVACHOL) 40 MG tablet, Take 40 mg by mouth at bedtime., Disp: , Rfl:  .  predniSONE (DELTASONE) 5 MG tablet, TAKE ONE TABLET BY MOUTH ONCE DAILY WITH BREAKFAST, Disp: 60 tablet, Rfl: 1 .  Probiotic Product ([place]) CAPS, Take 1 capsule by mouth at bedtime., Disp: , Rfl:  .  zolpidem (AMBIEN) 10 MG tablet, Take 5-10 mg by mouth at bedtime as needed for sleep., Disp: , Rfl:  .  calcipotriene (DOVONOX) 0.005 % cream, Apply 1 application topically 2 (two) times daily as needed (for psoriasis.)., Disp: , Rfl:  .  Ustekinumab 45 MG/0.5ML SOLN, Inject 45 mg into the skin every 3 (three) months. Taking every month Right now for 1 more dose, Disp: , Rfl:  No current facility-administered medications for this encounter.   Facility-Administered Medications Ordered in Other Encounters:  .  lidocaine (cardiac)  100 mg/53ml (XYLOCAINE) 20 MG/ML injection 2%, , , Anesthesia Intra-op, Flowers, Rokoshi T, CRNA, 60 mg at 03/25/13 1617 .  propofol (DIPRIVAN) 10 mg/mL bolus/IV push, , , Anesthesia Intra-op, Flowers, Rokoshi T, CRNA, 90 mg at 03/25/13 1617  Past Medical History: Past Medical History:  Diagnosis Date  . Adenomatous polyp of colon 2006  . Anemia   . Arthritis    "qwhere" (06/28/2016)  . Coronary artery disease    06/28/16 PCI with DES--> RCA 07/10/16 PCI with DES--> LAD  . Diabetes mellitus without complication ([place])    "suppose to be on Metformin but doesn't take it" (06/28/2016)  . Diverticulosis of colon  (without mention of hemorrhage) 1999, 2006  . Dyspepsia   . Epiretinal membrane    "epiretinal attachment" (06/28/2016)  . Esophagitis 1999  . GERD (gastroesophageal reflux disease)   . Heart murmur   . Hiatal hernia 1999  . History of kidney stones   . HOH (hard of hearing)    "wears aids"   . Hyperlipidemia   . Hypertension   . 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 Paroxysmal atrial fibrillation ([place])    a. 11/2003 Tikosyn initiated - subsequently d/c'd;  b. 2006 RFCA for Afib @ [facility];  c. 09/2011 Echo: EF 60-65%, Gr 2 DD;  d. 10/2011 DCCV  . Pneumonia 1990s X 1  . Psoriasis 1967  . Rheumatic fever ~ 1958  . Seasonal allergies   . Skin cancer    on leg, left arm  . Sleep apnea    patient has had the sleep test, but states they said it didn't need treatment; "will repeat test at home" (06/28/2016)    Tobacco Use: History  Smoking Status  . Former Smoker  . Years: 10.00  . Types: Cigars  . Quit date: 09/29/2001  Smokeless Tobacco  . Former Systems developer  . Types: Snuff, Chew  . Quit date: 09/29/2001    Labs: Recent Review Flowsheet Data    Labs for ITP Cardiac and Pulmonary Rehab Latest Ref Rng & Units 08/17/2009 02/21/2011 06/28/2016 06/28/2016 06/29/2016   Cholestrol 0 - 200 mg/dL 246 ATP III CLASSIFICATION: <200     mg/dL   Desirable 200-239  mg/dL   Borderline High >=240    mg/dL   High(H) - - - -   LDLCALC 0 - 99 mg/dL UNABLE TO CALCULATE IF TRIGLYCERIDE OVER 400 mg/dL Total Cholesterol/HDL:CHD Risk Coronary Heart Disease Risk Table Men   Women 1/2 Average Risk   3.4   3.3 Average Risk       5.0   4.4 2 X Average Risk   9.6   7.1 3 X Average Risk  23.4   11.0 Use the calculated Patient Ratio above and the CHD Risk Table to determine the patient's CHD Risk. ATP III CLASSIFICATION (LDL): <100     mg/dL   Optimal 100-129  mg/dL   Near or Above Optimal 130-159  mg/dL   Borderline 160-189  mg/dL   High >190     mg/dL   Very High - - - -    HDL >39 mg/dL 26(L) - - - -   Trlycerides <150 mg/dL 866(H) - - - -   Hemoglobin A1c 4.8 - 5.6 % - 6.5 - - 7.5(H)   PHART 7.350 - 7.450 - - 7.426 - -   PCO2ART 32.0 - 48.0 mmHg - - 33.0 - -   HCO3 20.0 - 28.0  mmol/L - - 21.7 23.6 -   TCO2 0 - 100 mmol/L 27 - 23 25 -   ACIDBASEDEF 0.0 - 2.0 mmol/L - - 2.0 1.0 -   O2SAT % - - 99.0 73.0 -      Capillary Blood Glucose: Lab Results  Component Value Date   GLUCAP 139 (H) 08/26/2016   GLUCAP 131 (H) 07/10/2016   GLUCAP 111 (H) 07/10/2016   GLUCAP 135 (H) 06/29/2016   GLUCAP 191 (H) 06/28/2016     Exercise Target Goals:    Exercise Program Goal: Individual exercise prescription set with THRR, safety & activity barriers. Participant demonstrates ability to understand and report RPE using BORG scale, to self-measure pulse accurately, and to acknowledge the importance of the exercise prescription.  Exercise Prescription Goal: Starting with aerobic activity 30 plus minutes a day, 3 days per week for initial exercise prescription. Provide home exercise prescription and guidelines that participant acknowledges understanding prior to discharge.  Activity Barriers & Risk Stratification:     Activity Barriers & Cardiac Risk Stratification - 08/20/16 0820      Activity Barriers & Cardiac Risk Stratification   Activity Barriers Deconditioning;Shortness of Breath;Muscular Weakness   Cardiac Risk Stratification High      6 Minute Walk:     6 Minute Walk    Row Name 08/20/16 1034         6 Minute Walk   Phase Initial     Distance 1371 feet     Walk Time 6 minutes     # of Rest Breaks 0     MPH 2.59     METS 3.02     RPE 11     VO2 Peak 10.56     Symptoms No     Resting HR 70 bpm     Resting BP 142/80     Max Ex. HR 103 bpm     Max Ex. BP 176/76     2 Minute Post BP 130/80        Oxygen Initial Assessment:   Oxygen Re-Evaluation:   Oxygen Discharge (Final Oxygen Re-Evaluation):   Initial Exercise  Prescription:     Initial Exercise Prescription - 08/20/16 1100      Date of Initial Exercise RX and Referring Provider   Date 08/20/16   Referring Provider [name] MD, and [name] MD     Treadmill   MPH 2.3   Grade 1   Minutes 10   METs 3.08     Bike   Level 0.8   Minutes 10   METs 1.79     NuStep   Level 3   SPM 70   Minutes 10   METs 2     Prescription Details   Frequency (times per week) 3   Duration Progress to 30 minutes of continuous aerobic without signs/symptoms of physical distress     Intensity   THRR 40-80% of Max Heartrate 60-119   Ratings of Perceived Exertion 11-13   Perceived Dyspnea 0-4     Progression   Progression Continue to progress workloads to maintain intensity without signs/symptoms of physical distress.     Resistance Training   Training Prescription Yes   Weight 3lbs   Reps 10-15      Perform Capillary Blood Glucose checks as needed.  Exercise Prescription Changes:   Exercise Comments:   Exercise Goals and Review:     Exercise Goals    Row Name 08/20/16 [phone]  Exercise Goals   Increase Physical Activity Yes       Intervention Provide advice, education, support and counseling about physical activity/exercise needs.;Develop an individualized exercise prescription for aerobic and resistive training based on initial evaluation findings, risk stratification, comorbidities and participant's personal goals.       Expected Outcomes Achievement of increased cardiorespiratory fitness and enhanced flexibility, muscular endurance and strength shown through measurements of functional capacity and personal statement of participant.       Increase Strength and Stamina Yes       Intervention Provide advice, education, support and counseling about physical activity/exercise needs.;Develop an individualized exercise prescription for aerobic and resistive training based on initial evaluation findings, risk  stratification, comorbidities and participant's personal goals.       Expected Outcomes Achievement of increased cardiorespiratory fitness and enhanced flexibility, muscular endurance and strength shown through measurements of functional capacity and personal statement of participant.          Exercise Goals Re-Evaluation :    Discharge Exercise Prescription (Final Exercise Prescription Changes):   Nutrition:  Target Goals: Understanding of nutrition guidelines, daily intake of sodium 1500mg , cholesterol 200mg , calories 30% from fat and 7% or less from saturated fats, daily to have 5 or more servings of fruits and vegetables.  Biometrics:     Pre Biometrics - 08/20/16 1034      Pre Biometrics   Height 6' 1.75" (1.873 m)   Weight 260 lb 2.3 oz (118 kg)   Waist Circumference 46 inches   Hip Circumference 44 inches   Waist to Hip Ratio 1.05 %   BMI (Calculated) 33.7   Triceps Skinfold 25 mm   % Body Fat 39.1 %   Grip Strength 47 kg   Flexibility 13 in   Single Leg Stand 8.24 seconds       Nutrition Therapy Plan and Nutrition Goals:   Nutrition Discharge: Nutrition Scores:   Nutrition Goals Re-Evaluation:   Nutrition Goals Re-Evaluation:   Nutrition Goals Discharge (Final Nutrition Goals Re-Evaluation):   Psychosocial: Target Goals: Acknowledge presence or absence of significant depression and/or stress, maximize coping skills, provide positive support system. Participant is able to verbalize types and ability to use techniques and skills needed for reducing stress and depression.  Initial Review & Psychosocial Screening:     Initial Psych Review & Screening - 08/26/16 1628      Family Dynamics   Good Support System? Yes     Barriers   Psychosocial barriers to participate in program There are no identifiable barriers or psychosocial needs.     Screening Interventions   Interventions Encouraged to exercise      Quality of Life  Scores:   PHQ-9: Recent Review Flowsheet Data    Depression screen [facility] 2/9 08/26/2016 07/18/2014   Decreased Interest 0 0   Down, Depressed, Hopeless 0 0   PHQ - 2 Score 0 0     Interpretation of Total Score  Total Score Depression Severity:  1-4 = Minimal depression, 5-9 = Mild depression, 10-14 = Moderate depression, 15-19 = Moderately severe depression, 20-27 = Severe depression   Psychosocial Evaluation and Intervention:   Psychosocial Re-Evaluation:   Psychosocial Discharge (Final Psychosocial Re-Evaluation):   Vocational Rehabilitation: Provide vocational rehab assistance to qualifying candidates.   Vocational Rehab Evaluation & Intervention:     Vocational Rehab - 08/26/16 1629      Initial Vocational Rehab Evaluation & Intervention   Assessment shows need for Vocational Rehabilitation No  Tilmon is  retired and does not need voational rehab at this time      Education: Education Goals: Education classes will be provided on a weekly basis, covering required topics. Participant will state understanding/return demonstration of topics presented.  Learning Barriers/Preferences:     Learning Barriers/Preferences - 08/20/16 0819      Learning Barriers/Preferences   Learning Barriers Hearing;Sight  cataract surgery   Learning Preferences Written Material      Education Topics: Count Your Pulse:  -Group instruction provided by verbal instruction, demonstration, patient participation and written materials to support subject.  Instructors address importance of being able to find your pulse and how to count your pulse when at home without a heart monitor.  Patients get hands on experience counting their pulse with staff help and individually.   Heart Attack, Angina, and Risk Factor Modification:  -Group instruction provided by verbal instruction, video, and written materials to support subject.  Instructors address signs and symptoms of angina and heart attacks.     Also discuss risk factors for heart disease and how to make changes to improve heart health risk factors.   Functional Fitness:  -Group instruction provided by verbal instruction, demonstration, patient participation, and written materials to support subject.  Instructors address safety measures for doing things around the house.  Discuss how to get up and down off the floor, how to pick things up properly, how to safely get out of a chair without assistance, and balance training.   Meditation and Mindfulness:  -Group instruction provided by verbal instruction, patient participation, and written materials to support subject.  Instructor addresses importance of mindfulness and meditation practice to help reduce stress and improve awareness.  Instructor also leads participants through a meditation exercise.    Stretching for Flexibility and Mobility:  -Group instruction provided by verbal instruction, patient participation, and written materials to support subject.  Instructors lead participants through series of stretches that are designed to increase flexibility thus improving mobility.  These stretches are additional exercise for major muscle groups that are typically performed during regular warm up and cool down.   Hands Only CPR:  -Group verbal, video, and participation provides a basic overview of AHA guidelines for community CPR. Role-play of emergencies allow participants the opportunity to practice calling for help and chest compression technique with discussion of AED use.   Hypertension: -Group verbal and written instruction that provides a basic overview of hypertension including the most recent diagnostic guidelines, risk factor reduction with self-care instructions and medication management.    Nutrition I class: Heart Healthy Eating:  -Group instruction provided by PowerPoint slides, verbal discussion, and written materials to support subject matter. The instructor gives an  explanation and review of the Therapeutic Lifestyle Changes diet recommendations, which includes a discussion on lipid goals, dietary fat, sodium, fiber, plant stanol/sterol esters, sugar, and the components of a well-balanced, healthy diet.   Nutrition II class: Lifestyle Skills:  -Group instruction provided by PowerPoint slides, verbal discussion, and written materials to support subject matter. The instructor gives an explanation and review of label reading, grocery shopping for heart health, heart healthy recipe modifications, and ways to make healthier choices when eating out.   Diabetes Question & Answer:  -Group instruction provided by PowerPoint slides, verbal discussion, and written materials to support subject matter. The instructor gives an explanation and review of diabetes co-morbidities, pre- and post-prandial blood glucose goals, pre-exercise blood glucose goals, signs, symptoms, and treatment of hypoglycemia and hyperglycemia, and foot care basics.  Diabetes Blitz:  -Group instruction provided by PowerPoint slides, verbal discussion, and written materials to support subject matter. The instructor gives an explanation and review of the physiology behind type 1 and type 2 diabetes, diabetes medications and rational behind using different medications, pre- and post-prandial blood glucose recommendations and Hemoglobin A1c goals, diabetes diet, and exercise including blood glucose guidelines for exercising safely.    Portion Distortion:  -Group instruction provided by PowerPoint slides, verbal discussion, written materials, and food models to support subject matter. The instructor gives an explanation of serving size versus portion size, changes in portions sizes over the last 20 years, and what consists of a serving from each food group.   Stress Management:  -Group instruction provided by verbal instruction, video, and written materials to support subject matter.  Instructors  review role of stress in heart disease and how to cope with stress positively.     Exercising on Your Own:  -Group instruction provided by verbal instruction, power point, and written materials to support subject.  Instructors discuss benefits of exercise, components of exercise, frequency and intensity of exercise, and end points for exercise.  Also discuss use of nitroglycerin and activating EMS.  Review options of places to exercise outside of rehab.  Review guidelines for sex with heart disease.   Cardiac Drugs I:  -Group instruction provided by verbal instruction and written materials to support subject.  Instructor reviews cardiac drug classes: antiplatelets, anticoagulants, beta blockers, and statins.  Instructor discusses reasons, side effects, and lifestyle considerations for each drug class.   Cardiac Drugs II:  -Group instruction provided by verbal instruction and written materials to support subject.  Instructor reviews cardiac drug classes: angiotensin converting enzyme inhibitors (ACE-I), angiotensin II receptor blockers (ARBs), nitrates, and calcium channel blockers.  Instructor discusses reasons, side effects, and lifestyle considerations for each drug class.   Anatomy and Physiology of the Circulatory System:  Group verbal and written instruction and models provide basic cardiac anatomy and physiology, with the coronary electrical and arterial systems. Review of: AMI, Angina, Valve disease, Heart Failure, Peripheral Artery Disease, Cardiac Arrhythmia, Pacemakers, and the ICD.   Other Education:  -Group or individual verbal, written, or video instructions that support the educational goals of the cardiac rehab program.   Knowledge Questionnaire Score:   Core Components/Risk Factors/Patient Goals at Admission:     Personal Goals and Risk Factors at Admission - 08/20/16 1114      Core Components/Risk Factors/Patient Goals on Admission    Weight Management  Yes;Obesity;Weight Loss;Weight Maintenance   Intervention Weight Management: Develop a combined nutrition and exercise program designed to reach desired caloric intake, while maintaining appropriate intake of nutrient and fiber, sodium and fats, and appropriate energy expenditure required for the weight goal.;Weight Management: Provide education and appropriate resources to help participant work on and attain dietary goals.;Weight Management/Obesity: Establish reasonable short term and long term weight goals.;Obesity: Provide education and appropriate resources to help participant work on and attain dietary goals.   Expected Outcomes Short Term: Continue to assess and modify interventions until short term weight is achieved;Long Term: Adherence to nutrition and physical activity/exercise program aimed toward attainment of established weight goal;Weight Maintenance: Understanding of the daily nutrition guidelines, which includes 25-35% calories from fat, 7% or less cal from saturated fats, less than 200mg  cholesterol, less than 1.5gm of sodium, & 5 or more servings of fruits and vegetables daily;Weight Loss: Understanding of general recommendations for a balanced deficit meal plan, which promotes 1-2 lb weight loss  per week and includes a negative energy balance of [phone] kcal/d;Understanding recommendations for meals to include 15-35% energy as protein, 25-35% energy from fat, 35-60% energy from carbohydrates, less than 200mg  of dietary cholesterol, 20-35 gm of total fiber daily;Understanding of distribution of calorie intake throughout the day with the consumption of 4-5 meals/snacks   Diabetes Yes   Intervention Provide education about signs/symptoms and action to take for hypo/hyperglycemia.;Provide education about proper nutrition, including hydration, and aerobic/resistive exercise prescription along with prescribed medications to achieve blood glucose in normal ranges: Fasting glucose 65-99 mg/dL    Expected Outcomes Short Term: Participant verbalizes understanding of the signs/symptoms and immediate care of hyper/hypoglycemia, proper foot care and importance of medication, aerobic/resistive exercise and nutrition plan for blood glucose control.;Long Term: Attainment of HbA1C < 7%.   Hypertension Yes   Intervention Provide education on lifestyle modifcations including regular physical activity/exercise, weight management, moderate sodium restriction and increased consumption of fresh fruit, vegetables, and low fat dairy, alcohol moderation, and smoking cessation.;Monitor prescription use compliance.   Expected Outcomes Short Term: Continued assessment and intervention until BP is < 140/87mm HG in hypertensive participants. < 130/51mm HG in hypertensive participants with diabetes, heart failure or chronic kidney disease.;Long Term: Maintenance of blood pressure at goal levels.   Lipids Yes   Intervention Provide education and support for participant on nutrition & aerobic/resistive exercise along with prescribed medications to achieve LDL 70mg , HDL >40mg .   Expected Outcomes Short Term: Participant states understanding of desired cholesterol values and is compliant with medications prescribed. Participant is following exercise prescription and nutrition guidelines.;Long Term: Cholesterol controlled with medications as prescribed, with individualized exercise RX and with personalized nutrition plan. Value goals: LDL < 70mg , HDL > 40 mg.      Core Components/Risk Factors/Patient Goals Review:    Core Components/Risk Factors/Patient Goals at Discharge (Final Review):    ITP Comments:     ITP Comments    Row Name 08/20/16 0802           ITP Comments Dr. Fransico Him, Medical Director           Comments: Rande is off to a good start to exercise and has completed 2 sessions.[name], RN,BSN 08/27/2016 2:41 PM

## 2016-08-28 ENCOUNTER — Encounter (HOSPITAL_COMMUNITY): Payer: Medicare Other

## 2016-08-30 ENCOUNTER — Encounter (HOSPITAL_COMMUNITY)
Admission: RE | Admit: 2016-08-30 | Discharge: 2016-08-30 | Disposition: A | Discharge status: Home / Self Care | Payer: Medicare Other | Source: Ambulatory Visit | Attending: Cardiology | Admitting: Cardiology

## 2016-08-30 DIAGNOSIS — Z9861 Coronary angioplasty status: Secondary | ICD-10-CM | POA: Diagnosis not present

## 2016-08-30 DIAGNOSIS — Z955 Presence of coronary angioplasty implant and graft: Secondary | ICD-10-CM

## 2016-08-30 LAB — GLUCOSE, CAPILLARY
GLUCOSE-CAPILLARY: 142 mg/dL — AB (ref 65–99)
Glucose-Capillary: 121 mg/dL — ABNORMAL HIGH (ref 65–99)

## 2016-09-04 ENCOUNTER — Encounter (HOSPITAL_COMMUNITY): Admission: RE | Admit: 2016-09-04 | Payer: Medicare Other | Source: Ambulatory Visit

## 2016-09-06 ENCOUNTER — Encounter (HOSPITAL_COMMUNITY)
Admission: RE | Admit: 2016-09-06 | Discharge: 2016-09-06 | Disposition: A | Discharge status: Home / Self Care | Payer: Medicare Other | Source: Ambulatory Visit | Attending: Cardiology | Admitting: Cardiology

## 2016-09-06 DIAGNOSIS — Z9861 Coronary angioplasty status: Secondary | ICD-10-CM | POA: Insufficient documentation

## 2016-09-06 DIAGNOSIS — Z955 Presence of coronary angioplasty implant and graft: Secondary | ICD-10-CM | POA: Diagnosis present

## 2016-09-06 LAB — GLUCOSE, CAPILLARY: Glucose-Capillary: 129 mg/dL — ABNORMAL HIGH (ref 65–99)

## 2016-09-09 ENCOUNTER — Encounter (HOSPITAL_COMMUNITY)
Admission: RE | Admit: 2016-09-09 | Discharge: 2016-09-09 | Disposition: A | Discharge status: Home / Self Care | Payer: Medicare Other | Source: Ambulatory Visit | Attending: Cardiology | Admitting: Cardiology

## 2016-09-09 DIAGNOSIS — Z955 Presence of coronary angioplasty implant and graft: Secondary | ICD-10-CM

## 2016-09-09 DIAGNOSIS — Z9861 Coronary angioplasty status: Secondary | ICD-10-CM | POA: Diagnosis not present

## 2016-09-09 LAB — GLUCOSE, CAPILLARY: Glucose-Capillary: 144 mg/dL — ABNORMAL HIGH (ref 65–99)

## 2016-09-11 ENCOUNTER — Encounter (HOSPITAL_COMMUNITY)
Admission: RE | Admit: 2016-09-11 | Discharge: 2016-09-11 | Disposition: A | Discharge status: Home / Self Care | Payer: Medicare Other | Source: Ambulatory Visit | Attending: Cardiology | Admitting: Cardiology

## 2016-09-11 VITALS — Ht 73.75 in | Wt 263.0 lb

## 2016-09-11 DIAGNOSIS — Z955 Presence of coronary angioplasty implant and graft: Secondary | ICD-10-CM

## 2016-09-11 DIAGNOSIS — Z9861 Coronary angioplasty status: Secondary | ICD-10-CM | POA: Diagnosis not present

## 2016-09-12 ENCOUNTER — Other Ambulatory Visit (HOSPITAL_COMMUNITY): Payer: Medicare Other

## 2016-09-13 ENCOUNTER — Encounter (HOSPITAL_COMMUNITY): Payer: Medicare Other

## 2016-09-16 ENCOUNTER — Encounter (HOSPITAL_COMMUNITY): Payer: Medicare Other

## 2016-09-17 ENCOUNTER — Ambulatory Visit (INDEPENDENT_AMBULATORY_CARE_PROVIDER_SITE_OTHER): Payer: Medicare Other | Admitting: Cardiology

## 2016-09-17 ENCOUNTER — Encounter: Payer: Self-pay | Admitting: Cardiology

## 2016-09-17 VITALS — BP 126/72 | HR 75 | Ht 75.0 in | Wt 262.4 lb

## 2016-09-17 DIAGNOSIS — Z9861 Coronary angioplasty status: Secondary | ICD-10-CM | POA: Diagnosis not present

## 2016-09-17 DIAGNOSIS — I358 Other nonrheumatic aortic valve disorders: Secondary | ICD-10-CM | POA: Diagnosis not present

## 2016-09-17 DIAGNOSIS — I2510 Atherosclerotic heart disease of native coronary artery without angina pectoris: Secondary | ICD-10-CM

## 2016-09-17 DIAGNOSIS — I484 Atypical atrial flutter: Secondary | ICD-10-CM | POA: Diagnosis not present

## 2016-09-17 DIAGNOSIS — I25119 Atherosclerotic heart disease of native coronary artery with unspecified angina pectoris: Secondary | ICD-10-CM

## 2016-09-17 DIAGNOSIS — I5032 Chronic diastolic (congestive) heart failure: Secondary | ICD-10-CM | POA: Diagnosis not present

## 2016-09-17 DIAGNOSIS — R0609 Other forms of dyspnea: Secondary | ICD-10-CM | POA: Diagnosis not present

## 2016-09-17 DIAGNOSIS — I209 Angina pectoris, unspecified: Secondary | ICD-10-CM

## 2016-09-17 MED ORDER — VALSARTAN 40 MG PO TABS: 40.0000 mg | ORAL_TABLET | Freq: Every day | ORAL | 3 refills | Status: DC

## 2016-09-17 NOTE — Patient Instructions (Signed)
Medication changes  -- STOP Diltiazem now  ---START - on Monday-- Valsartan 40 mg one tablet daily    NO OTHER CHANGES      Your physician recommends that you schedule a follow-up appointment in 3-4 MONTHS WITH DR HARDING.

## 2016-09-17 NOTE — Progress Notes (Signed)
PCP: [facility] Note: Chief Complaint  Patient presents with  . Follow-up    pt denied chest pain, pt c/o SOB  . Coronary Artery Disease    mRCA & mLAD PCI    HPI: ANA LIAW is a 71 y.o. male with a PMH below who presents today for follow-up status post PCI of LAD and RCA. Rhydian has long been followed by electrophysiology for his history of recurrent atrial fibrillation status post redo ablation. He was having progressively worsening dyspnea symptoms and was referred for cardiac catheterization for an abnormal Myoview. On A found a severe lesion in the mid RCA as well as a lesion in the LAD. I treated the most significant lesion in the RCA and perform FFR on the LAD followed by staged PCI on the LAD back in March-April 2018.   DAVAUN QUINTELA was seen on April 13 by Almyra Deforest, PA for initial post PCI follow-up  He was seen by Dr. Rayann Heman on May 14: Indicated that he was feeling quite well just getting little short of breath if he "overdoes it. He was excited to start cardiac rehabilitation and has been doing so since. Denied any palpitations or chest pain.  Recent Hospitalizations: none  Studies Personally Reviewed - (if available, images/films reviewed: From Epic Chart or Care Everywhere)   Right/Left Heart Cath and Coronary Angiography w/Coronary Stent Intervention & Intravascular Pressure Wire/FFR Study (06/28/2016)  - Staged Coronary Stent Intervention (4/4/2018_   Mid RCA lesion, 99 %stenosed.  A STENT SYNERGY DES 2.75X20 drug eluting stent was successfully placed. Post intervention, there is a 0% residual stenosis.  Mid LAD lesion, 65 %stenosed. FFR performed => final FFR 0.75 (physiologically significant).  Staged PCI   Mid LAD (just distal to SP1) lesion, 65-70 %stenosed. FFR 0.75  A STENT SYNERGY DES 3.5X20 drug eluting stent was successfully placed. Post intervention, there is a 0% residual stenosis. 3rd Mrg lesion, 55 %stenosed.  Hemodynamic  findings consistent with mild pulmonary hypertension. LV end diastolic pressure is moderately elevated.  The left ventricular systolic function is normal. The left ventricular ejection fraction is 55-65% by visual estimate.   Diagnostic Diagram 06/28/2016    Post-Intervention Diagram 07/10/2016 PCI mRCA Synergy 2.75 x 20    PCI mLAD Synergy DES 3.5 x 20       Interval History: Beckett presents today just a little bit frustrated because he continues to have his exertional dyspnea. It deathly has improved, and he definitely is able to do much more than he was before the PCI, but he still has dyspnea. He is doing cardiac rehabilitation, but states that he does more in the morning before going to cardiac rehabilitation in the [place] is a cardiac rehabilitation. He is trying to change his diet and his increase his exercise. He does feel like his lost some weight, but not as much as he would expect. His energy is just not coming back now. He has no PND orthopnea or edema. No recurrent symptoms of A. fib No syncope/near syncope or TIAs amaurosis fugax. He is now on aspirin and Plavix and not on a DOAC. No melena, hematochezia, hematuria, or epstaxis. No claudication.  ROS: A comprehensive was performed. Review of Systems  Constitutional: Negative for malaise/fatigue (Energy is better, he just doesn't have the exercise tolerance that he would like to have.).  HENT: Negative for nosebleeds.   Respiratory: Positive for shortness of breath (Pretty much at baseline in for himself now. But notably  improved since PCI).   Cardiovascular:       Per history of present illness  Gastrointestinal: Negative for heartburn (He takes Protonix).  Genitourinary: Negative for dysuria and urgency.  Musculoskeletal: Negative for back pain, falls and joint pain.  Skin: Negative.   Neurological: Negative for dizziness.  Endo/Heme/Allergies: Does not bruise/bleed easily.  Psychiatric/Behavioral: Negative for depression and  memory loss. The patient does not have insomnia.   All other systems reviewed and are negative.   I have reviewed and (if needed) personally updated the patient's problem list, medications, allergies, past medical and surgical history, social and family history.   Past Medical History:  Diagnosis Date  . Adenomatous polyp of colon 2006  . Anemia   . Arthritis    "qwhere" (06/28/2016)  . CAD S/P percutaneous coronary angioplasty 06/2016   06/28/16 PCI with Synergy DES 2.75 mm 20 mm-->m RCA; 07/10/16 PCI with Synergy DES 3.5 mm x 20 mm-->mLAD  . Diabetes mellitus without complication ([place])    "suppose to be on Metformin but doesn't take it" (06/28/2016)  . Diverticulosis of colon (without mention of hemorrhage) 1999, 2006  . Dyspepsia   . Epiretinal membrane    "epiretinal attachment" (06/28/2016)  . Esophagitis 1999  . GERD (gastroesophageal reflux disease)   . Heart murmur   . Hiatal hernia 1999  . History of kidney stones   . HOH (hard of hearing)    "wears aids"   . Hyperlipidemia   . Hypertension   . 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 Paroxysmal atrial fibrillation ([place])    a. 11/2003 Tikosyn initiated - subsequently d/c'd;  b. 2006 RFCA for Afib @ [facility];  c. 09/2011 Echo: EF 60-65%, Gr 2 DD;  d. 10/2011 DCCV  . Pneumonia 1990s X 1  . Psoriasis 1967  . Rheumatic fever ~ 1958  . Seasonal allergies   . Skin cancer    on leg, left arm  . Sleep apnea    patient has had the sleep test, but states they said it didn't need treatment; "will repeat test at home" (06/28/2016)    Past Surgical History:  Procedure Laterality Date  . APPENDECTOMY  1996  . ATRIAL FIBRILLATION ABLATION N/A 01/13/2014   repeat PVI, also left atrial ablation performed with successfull ablation of LA flutter by Dr Rayann Heman  . ATRIAL FIBRILLATION ABLATION  2006   Dr Rolland Porter at [facility]  . CARDIAC CATHETERIZATION  2005  . CARDIOVERSION N/A 05/22/2013   Procedure: CARDIOVERSION;   Surgeon: [name], MD;  Location: [facility];  Service: Cardiovascular;  Laterality: N/A;  . CARDIOVERSION N/A 10/23/2011   Procedure: CARDIOVERSION;  Surgeon: Deboraha Sprang, MD;  Location[facility] C[facility] LAB;  Service: Cardiovascular;  Laterality: N/A;  . CARDIOVERSION N/A 05/01/2012   Procedure: CARDIOVERSION;  Surgeon: Deboraha Sprang, MD;  Location: [facility] CATH LAB;  Service: Cardiovascular;  Laterality: N/A;  . CARPAL TUNNEL RELEASE Right 1980s?  Marland Kitchen CATARACT EXTRACTION W/ INTRAOCULAR LENS  IMPLANT, BILATERAL Bilateral   . COLONOSCOPY    . CORONARY ANGIOPLASTY WITH STENT PLACEMENT  06/28/2016  . CORONARY STENT INTERVENTION N/A 06/28/2016   Procedure: Coronary Stent Intervention;  Surgeon: Leonie Man, MD;  Location: [facility] CV LAB;  Service: Cardiovascular;  Laterality: mRCA PCI -Synergy DES 2.75 m x 20 mm (3.1 mm)  . CORONARY STENT INTERVENTION N/A 07/10/2016   Procedure: Coronary Stent Intervention;  Surgeon: Leonie Man, MD;  Location:  [facility] INVASIVE CV LAB;  Service: Cardiovascular: mLAD PCI Synergy DES 3.5 mm x 20 mm)  . CYSTOSCOPY W/ STONE MANIPULATION  2010   Patient states he has had several kidney stones up until aroune 2010, none since (06/28/2016)  . ESOPHAGOGASTRODUODENOSCOPY    . INTRAVASCULAR PRESSURE WIRE/FFR STUDY N/A 06/28/2016   Procedure: Intravascular Pressure Wire/FFR Study;  Surgeon: Leonie Man, MD;  Location: [facility] CV LAB;  Service: Cardiovascular: FFR of mLAD ~65% lesion = 0.75 post --> Staged PCI  . NASAL SINUS SURGERY     x 2  . RIGHT/LEFT HEART CATH AND CORONARY ANGIOGRAPHY N/A 06/28/2016   Procedure: Right/Left Heart Cath and Coronary Angiography;  Surgeon: Leonie Man, MD;  Location: [facility] CV LAB;  Service: Cardiovascular: mRCA 99% (TIMI 2), mLAD ~65% (FFR 0.75), OM3 55%. mild Pulm HTN. Mod LVEDP elevation. EF 55-65%.  . SHOULDER OPEN ROTATOR CUFF REPAIR Bilateral   . TONSILLECTOMY      Current Meds  Medication Sig  . acetaminophen (TYLENOL) 500  MG tablet Take 1,000 mg by mouth every 6 (six) hours as needed (for pain.).  Marland Kitchen aspirin 81 MG chewable tablet Chew 1 tablet (81 mg total) by mouth daily.  . Biotin 1000 MCG tablet Take 1,000 mcg by mouth at bedtime.  . calcipotriene (DOVONOX) 0.005 % cream Apply 1 application topically 2 (two) times daily as needed (for psoriasis.).  Marland Kitchen carvedilol (COREG) 3.125 MG tablet Take 1 tablet (3.125 mg total) by mouth 2 (two) times daily with a meal.  . cetirizine (ZYRTEC) 10 MG tablet Take 10 mg by mouth daily.  . clobetasol cream (TEMOVATE) 8.67 % Apply 1 application topically 2 (two) times daily as needed (for psoriasis).  . clopidogrel (PLAVIX) 75 MG tablet Take 1 tablet (75 mg total) by mouth daily with breakfast.  . Flurandrenolide (CORDRAN) 4 MCG/SQCM TAPE Apply 1 each topically at bedtime. Applied to areas affected by psoriasis.  Javier Docker Oil 500 MG CAPS Take 500 mg by mouth at bedtime.  . magnesium oxide (MAG-OX) 400 MG tablet Take 400 mg by mouth daily.  . metFORMIN (GLUMETZA) 500 MG (MOD) 24 hr tablet Take 1 tablet (500 mg total) by mouth daily with breakfast.  . nitroGLYCERIN (NITROSTAT) 0.4 MG SL tablet Place 1 tablet (0.4 mg total) under the tongue every 5 (five) minutes as needed for chest pain.  . pantoprazole (PROTONIX) 40 MG tablet Take 1 tablet (40 mg total) by mouth daily.  Vladimir Faster Glycol-Propyl Glycol (SYSTANE) 0.4-0.3 % GEL ophthalmic gel Place 1 application into both eyes at bedtime.  . pravastatin (PRAVACHOL) 40 MG tablet Take 40 mg by mouth at bedtime.  . predniSONE (DELTASONE) 5 MG tablet TAKE ONE TABLET BY MOUTH ONCE DAILY WITH BREAKFAST  . Probiotic Product ([place]) CAPS Take 1 capsule by mouth at bedtime.  Marland Kitchen Ustekinumab 45 MG/0.5ML SOLN Inject 45 mg into the skin every 3 (three) months. Taking every month Right now for 1 more dose  . zolpidem (AMBIEN) 10 MG tablet Take 5-10 mg by mouth at bedtime as needed for sleep.  . [DISCONTINUED] diltiazem (CARTIA XT) 120  MG 24 hr capsule Take 1 capsule (120 mg total) by mouth daily.    Allergies  Allergen Reactions  . Hydrocodone-Acetaminophen Shortness Of Breath    In combination with decongestants   . Other Palpitations and Other (See Comments)    ALL DECONGESTANTS - CAUSE PALPITATIONS; THROWS HEART RHYTHM OUT OF BALANCE  . Oxycodone-Acetaminophen Shortness Of Breath  .  Pseudoephedrine Palpitations  . Tramadol Shortness Of Breath and Rash    flushing    Social History   Social History  . Marital status: Married    Spouse name: N/A  . Number of children: 1  . Years of education: N/A   Occupational History  . Retired Armed forces operational officer    Social History Main Topics  . Smoking status: Former Smoker    Years: 10.00    Types: Cigars    Quit date: 09/29/2001  . Smokeless tobacco: Former Systems developer    Types: Snuff, Chew    Quit date: 09/29/2001  . Alcohol use No  . Drug use: No  . Sexual activity: Not Asked   Other Topics Concern  . None   Social History Narrative   Pt lives in [place] with spouse.     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    family history includes Breast cancer in his mother; Colon cancer in his mother; Coronary artery disease in his mother and paternal grandfather; Heart disease in his maternal grandmother; Stroke in his paternal grandfather.  Wt Readings from Last 3 Encounters:  09/17/16 262 lb 6.4 oz (119 kg)  08/20/16 260 lb 2.3 oz (118 kg)  08/19/16 258 lb (117 kg)  06/2016 - 267 LB (but @ home - ~13 lb wgt loss) - just hard with prednisone  PHYSICAL EXAM BP 126/72   Pulse 75   Ht 6\' 3"  (1.905 m)   Wt 262 lb 6.4 oz (119 kg)   BMI 32.80 kg/m  General appearance: alert, cooperative, appears stated age, no distress. mildly obese. Well-nourished and well-groomed HEENT: [place]/AT, EOMI, MMM, anicteric sclera Neck: no adenopathy, no carotid bruit and no JVD Lungs: Mostly clear to auscultation bilaterally with mild interstitial breath sounds heard  throughout, normal percussion bilaterally and non-labored Heart: regular rate and rhythm, S1 &S2 normal,. No rubs or gallops. ~2/6 early peaking c-d SEM @ [facility]. Abdomen: soft, non-tender; bowel sounds normal; no masses,  no organomegaly; no HJR Extremities: extremities normal, atraumatic, no cyanosis, and edema - trivial  Pulses: 2+ and symmetric;  Skin: mobility and turgor normal, no evidence of bleeding or bruising, no lesions noted, no periungual infections and temperature normal or  Neurologic: Mental status: Alert & oriented x 3, thought content appropriate; non-focal exam.  Pleasant mood & affect.    Adult ECG Report n/a  Other studies Reviewed: Additional studies/ records that were reviewed today include:  Recent Labs:   Lab Results  Component Value Date   CREATININE 1.07 07/08/2016   BUN 20 07/08/2016   NA 135 07/08/2016   K 4.8 07/08/2016   CL 99 07/08/2016   CO2 29 07/08/2016    ASSESSMENT / PLAN: Problem List Items Addressed This Visit    Aortic valve sclerosis (Chronic)   Relevant Medications   valsartan (DIOVAN) 40 MG tablet   Atypical atrial flutter (HCC) (Chronic)   Relevant Medications   valsartan (DIOVAN) 40 MG tablet   Other Relevant Orders   EKG 12-Lead (Completed)   CAD S/P percutaneous coronary angioplasty    On aspirin and Plavix. No bleeding. On beta blocker and statin.      Relevant Medications   valsartan (DIOVAN) 40 MG tablet   Other Relevant Orders   EKG 12-Lead (Completed)   Coronary artery disease involving native coronary artery with angina pectoris ([place]) - Primary (Chronic)    Status post PCI to the RCA and LAD staged fashion. He is not having any anginal symptoms.  He still has some exertional dyspnea, but it has improved. We just told for a little bit better result for symptom relief. He is on aspirin plus Plavix for stents. He is on low-dose carvedilol as well as diltiazem. He is also on pravastatin.      Relevant Medications    valsartan (DIOVAN) 40 MG tablet   Other Relevant Orders   EKG 12-Lead (Completed)   Diastolic CHF, chronic (HCC) (Chronic)    Relatively normal right heart cath numbers. He did have an elevated LVEDP on cath, therefore we do need to ensure adequate afterload reduction. I have a heart time believing this is the main etiology for his dyspnea however. Plan: Since he is not had any breakthrough A. fib, we will discontinue diltiazem gradually weaning off, and then start valsartan for more blood pressure/afterload control. Next step would be to consider increasing carvedilol. Seems euvolemic. Not on diuretic at this time. No real PND and orthopnea so makes this an unlikely etiology for any resting dyspnea. He still could have exertional dyspnea Pulmonary hypertension was more related to secondary to venous congestion.      Relevant Medications   valsartan (DIOVAN) 40 MG tablet   Other Relevant Orders   EKG 12-Lead (Completed)   Exertional dyspnea (Chronic)    Some improvement with PCI, but he still has exertional dyspnea which is probably related to ILD. There may be a mild pulmonary venous congestion input to his dyspnea, but unlikely to be the main reason. I'm increasing his afterload reduction by switching out diltiazem for valsartan. Monitor for symptoms, we could potentially titrate up further either ARB or beta blocker.      Relevant Orders   EKG 12-Lead (Completed)   Percutaneous transluminal coronary angioplasty (PTCA) within last 14 to 24 months      Current medicines are reviewed at length with the patient today. (+/- concerns) n/a The following changes have been made: See below.  Patient Instructions  Medication changes  -- STOP Diltiazem now  ---START - on Monday-- Valsartan 40 mg one tablet daily    NO OTHER CHANGES      Your physician recommends that you schedule a follow-up appointment in 3-4 MONTHS WITH DR Yoana Staib.    Studies Ordered:   Orders Placed This  Encounter  Procedures  . EKG 12-Lead      [name], M.D., M.S. Interventional Cardiologist   Pager # [phone] Phone # [phone] [address] [place], [place] [address]

## 2016-09-18 ENCOUNTER — Encounter (HOSPITAL_COMMUNITY): Payer: Medicare Other

## 2016-09-20 ENCOUNTER — Encounter: Payer: Self-pay | Admitting: Cardiology

## 2016-09-20 ENCOUNTER — Encounter (HOSPITAL_COMMUNITY): Payer: Medicare Other

## 2016-09-20 NOTE — Assessment & Plan Note (Signed)
Some improvement with PCI, but he still has exertional dyspnea which is probably related to ILD. There may be a mild pulmonary venous congestion input to his dyspnea, but unlikely to be the main reason. I'm increasing his afterload reduction by switching out diltiazem for valsartan. Monitor for symptoms, we could potentially titrate up further either ARB or beta blocker.

## 2016-09-20 NOTE — Assessment & Plan Note (Signed)
On aspirin and Plavix. No bleeding. On beta blocker and statin.

## 2016-09-20 NOTE — Assessment & Plan Note (Signed)
Relatively normal right heart cath numbers. He did have an elevated LVEDP on cath, therefore we do need to ensure adequate afterload reduction. I have a heart time believing this is the main etiology for his dyspnea however. Plan: Since he is not had any breakthrough A. fib, we will discontinue diltiazem gradually weaning off, and then start valsartan for more blood pressure/afterload control. Next step would be to consider increasing carvedilol. Seems euvolemic. Not on diuretic at this time. No real PND and orthopnea so makes this an unlikely etiology for any resting dyspnea. He still could have exertional dyspnea Pulmonary hypertension was more related to secondary to venous congestion.

## 2016-09-20 NOTE — Assessment & Plan Note (Signed)
Status post PCI to the RCA and LAD staged fashion. He is not having any anginal symptoms. He still has some exertional dyspnea, but it has improved. We just told for a little bit better result for symptom relief. He is on aspirin plus Plavix for stents. He is on low-dose carvedilol as well as diltiazem. He is also on pravastatin.

## 2016-09-23 ENCOUNTER — Telehealth: Payer: Self-pay | Admitting: Physician Assistant

## 2016-09-23 ENCOUNTER — Encounter (HOSPITAL_COMMUNITY)
Admission: RE | Admit: 2016-09-23 | Discharge: 2016-09-23 | Disposition: A | Discharge status: Home / Self Care | Payer: Medicare Other | Source: Ambulatory Visit | Attending: Cardiology | Admitting: Cardiology

## 2016-09-23 DIAGNOSIS — Z955 Presence of coronary angioplasty implant and graft: Secondary | ICD-10-CM

## 2016-09-23 DIAGNOSIS — Z9861 Coronary angioplasty status: Secondary | ICD-10-CM | POA: Diagnosis not present

## 2016-09-23 LAB — GLUCOSE, CAPILLARY: GLUCOSE-CAPILLARY: 126 mg/dL — AB (ref 65–99)

## 2016-09-23 NOTE — Telephone Encounter (Signed)
Paged with cardiac rehab and spoke with Kenneth [name] as patient was having significant amount of fatigue since his diltiazem was switched to valsartan last week. Although he says he did have fatigue in March prior to PCI, he does not think this is similar to his angina. He also mentions his SBP dropped as low as 100s at home. His BP is stable today in 120s. EKG showed no ischemia. I recommended stop valsartan and followup in 1 week in cardiology office for reassessment. He is aware that he should seek urgent medical attention if his symptom continue to deteriorate despite holding the valsartan.   Hilbert Corrigan PA Pager: [phone]

## 2016-09-23 NOTE — Progress Notes (Signed)
Incomplete Session Note  Patient Details  Name: Kenneth [name] MRN: [id number] Date of Birth: [birth year] Referring Provider:     CARDIAC REHAB PHASE II ORIENTATION from 08/20/2016 in [facility]  Referring Provider  [name] MD, and [name] MD      [name] did not complete his rehab session.  [name] reports that he has not felt good since his diltiazem was discontinued and he was switched to valsartan. [name] says he has been getting some low blood pressure readings at home. Sitting blood pressure 128/78. Heart rate 77. Standing blood pressure 126/67. Heart rate 83. CBG 126. Wiatt says he does not feel well and does not feel like exercising today. Telemetry rhythm Sinus. [name] PAC called and notified. Hal talked with [name] over the phone and told him to hold off on taking his Valsartan starting tonight. Verbal order received from [facility] PAC to obtain a 12 lead ECG. Hal told [name] to follow up to follow up with a physician extender this week and to go to the emergency department if his symptoms get worse. Hal reviewed [name]'s 12 lead ECG ECG. Dr Allison Quarry office  Will call [name] with a follow up appointment. [name] left cardiac rehab without complaints.Will continue to monitor the patient throughout  the program. [name], RN,BSN 09/23/2016 4:07 PM

## 2016-09-25 ENCOUNTER — Encounter (HOSPITAL_COMMUNITY): Payer: Medicare Other

## 2016-09-25 NOTE — Progress Notes (Signed)
Cardiac Individual Treatment Plan  Patient Details  Name: Kenneth [name] MRN: [id number] Date of Birth: [birth year] Referring Provider:     CARDIAC REHAB PHASE II ORIENTATION from 08/20/2016 in [place] [facility]  Referring Provider  [name] MD, and [name] MD      Initial Encounter Date:    CARDIAC REHAB PHASE II ORIENTATION from 08/20/2016 in [place]  Date  08/20/16  Referring Provider  [name] MD, and [name] MD      Visit Diagnosis: 06/28/16 and 07/10/16 Stented coronary artery  Patient's Home Medications on Admission:  Current Outpatient Prescriptions:  .  acetaminophen (TYLENOL) 500 MG tablet, Take 1,000 mg by mouth every 6 (six) hours as needed (for pain.)., Disp: , Rfl:  .  aspirin 81 MG chewable tablet, Chew 1 tablet (81 mg total) by mouth daily., Disp: 30 tablet, Rfl: 6 .  Biotin 1000 MCG tablet, Take 1,000 mcg by mouth at bedtime., Disp: , Rfl:  .  calcipotriene (DOVONOX) 0.005 % cream, Apply 1 application topically 2 (two) times daily as needed (for psoriasis.)., Disp: , Rfl:  .  carvedilol (COREG) 3.125 MG tablet, Take 1 tablet (3.125 mg total) by mouth 2 (two) times daily with a meal., Disp: 60 tablet, Rfl: 6 .  cetirizine (ZYRTEC) 10 MG tablet, Take 10 mg by mouth daily., Disp: , Rfl:  .  clobetasol cream (TEMOVATE) 1.79 %, Apply 1 application topically 2 (two) times daily as needed (for psoriasis)., Disp: , Rfl:  .  clopidogrel (PLAVIX) 75 MG tablet, Take 1 tablet (75 mg total) by mouth daily with breakfast., Disp: 30 tablet, Rfl: 6 .  Flurandrenolide (CORDRAN) 4 MCG/SQCM TAPE, Apply 1 each topically at bedtime. Applied to areas affected by psoriasis., Disp: , Rfl:  .  Krill Oil 500 MG CAPS, Take 500 mg by mouth at bedtime., Disp: , Rfl:  .  magnesium oxide (MAG-OX) 400 MG tablet, Take 400 mg by mouth daily., Disp: , Rfl:  .  metFORMIN (GLUMETZA) 500 MG (MOD) 24 hr tablet, Take 1 tablet (500  mg total) by mouth daily with breakfast., Disp: 30 tablet, Rfl: 3 .  nitroGLYCERIN (NITROSTAT) 0.4 MG SL tablet, Place 1 tablet (0.4 mg total) under the tongue every 5 (five) minutes as needed for chest pain., Disp: 25 tablet, Rfl: 12 .  pantoprazole (PROTONIX) 40 MG tablet, Take 1 tablet (40 mg total) by mouth daily., Disp: 30 tablet, Rfl: 6 .  Polyethyl Glycol-Propyl Glycol (SYSTANE) 0.4-0.3 % GEL ophthalmic gel, Place 1 application into both eyes at bedtime., Disp: , Rfl:  .  pravastatin (PRAVACHOL) 40 MG tablet, Take 40 mg by mouth at bedtime., Disp: , Rfl:  .  predniSONE (DELTASONE) 5 MG tablet, TAKE ONE TABLET BY MOUTH ONCE DAILY WITH BREAKFAST, Disp: 60 tablet, Rfl: 1 .  Probiotic Product ([place]) CAPS, Take 1 capsule by mouth at bedtime., Disp: , Rfl:  .  Ustekinumab 45 MG/0.5ML SOLN, Inject 45 mg into the skin every 3 (three) months. Taking every month Right now for 1 more dose, Disp: , Rfl:  .  valsartan (DIOVAN) 40 MG tablet, Take 1 tablet (40 mg total) by mouth daily., Disp: 90 tablet, Rfl: 3 .  zolpidem (AMBIEN) 10 MG tablet, Take 5-10 mg by mouth at bedtime as needed for sleep., Disp: , Rfl:  No current facility-administered medications for this encounter.   Facility-Administered Medications Ordered in Other Encounters:  .  lidocaine (cardiac) 100 mg/86ml (XYLOCAINE)  20 MG/ML injection 2%, , , Anesthesia Intra-op, Flowers, Rokoshi T, CRNA, 60 mg at 03/25/13 1617 .  propofol (DIPRIVAN) 10 mg/mL bolus/IV push, , , Anesthesia Intra-op, Flowers, Rokoshi T, CRNA, 90 mg at 03/25/13 1617  Past Medical History: Past Medical History:  Diagnosis Date  . Adenomatous polyp of colon 2006  . Anemia   . Arthritis    "qwhere" (06/28/2016)  . CAD S/P percutaneous coronary angioplasty 06/2016   06/28/16 PCI with Synergy DES 2.75 mm 20 mm-->m RCA; 07/10/16 PCI with Synergy DES 3.5 mm x 20 mm-->mLAD  . Diabetes mellitus without complication ([place])    "suppose to be on Metformin but  doesn't take it" (06/28/2016)  . Diverticulosis of colon (without mention of hemorrhage) 1999, 2006  . Dyspepsia   . Epiretinal membrane    "epiretinal attachment" (06/28/2016)  . Esophagitis 1999  . GERD (gastroesophageal reflux disease)   . Heart murmur   . Hiatal hernia 1999  . History of kidney stones   . HOH (hard of hearing)    "wears aids"   . Hyperlipidemia   . Hypertension   . 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 Paroxysmal atrial fibrillation ([place])    a. 11/2003 Tikosyn initiated - subsequently d/c'd;  b. 2006 RFCA for Afib @ [facility];  c. 09/2011 Echo: EF 60-65%, Gr 2 DD;  d. 10/2011 DCCV  . Pneumonia 1990s X 1  . Psoriasis 1967  . Rheumatic fever ~ 1958  . Seasonal allergies   . Skin cancer    on leg, left arm  . Sleep apnea    patient has had the sleep test, but states they said it didn't need treatment; "will repeat test at home" (06/28/2016)    Tobacco Use: History  Smoking Status  . Former Smoker  . Years: 10.00  . Types: Cigars  . Quit date: 09/29/2001  Smokeless Tobacco  . Former Systems developer  . Types: Snuff, Chew  . Quit date: 09/29/2001    Labs: Recent Review Flowsheet Data    Labs for ITP Cardiac and Pulmonary Rehab Latest Ref Rng & Units 08/17/2009 02/21/2011 06/28/2016 06/28/2016 06/29/2016   Cholestrol 0 - 200 mg/dL 246 ATP III CLASSIFICATION: <200     mg/dL   Desirable 200-239  mg/dL   Borderline High >=240    mg/dL   High(H) - - - -   LDLCALC 0 - 99 mg/dL UNABLE TO CALCULATE IF TRIGLYCERIDE OVER 400 mg/dL Total Cholesterol/HDL:CHD Risk Coronary Heart Disease Risk Table Men   Women 1/2 Average Risk   3.4   3.3 Average Risk       5.0   4.4 2 X Average Risk   9.6   7.1 3 X Average Risk  23.4   11.0 Use the calculated Patient Ratio above and the CHD Risk Table to determine the patient's CHD Risk. ATP III CLASSIFICATION (LDL): <100     mg/dL   Optimal 100-129  mg/dL   Near or Above Optimal 130-159  mg/dL    Borderline 160-189  mg/dL   High >190     mg/dL   Very High - - - -   HDL >39 mg/dL 26(L) - - - -   Trlycerides <150 mg/dL 866(H) - - - -   Hemoglobin A1c 4.8 - 5.6 % - 6.5 - - 7.5(H)   PHART 7.350 - 7.450 - - 7.426 - -   PCO2ART 32.0 - 48.0 mmHg - -  33.0 - -   HCO3 20.0 - 28.0 mmol/L - - 21.7 23.6 -   TCO2 0 - 100 mmol/L 27 - 23 25 -   ACIDBASEDEF 0.0 - 2.0 mmol/L - - 2.0 1.0 -   O2SAT % - - 99.0 73.0 -      Capillary Blood Glucose: Lab Results  Component Value Date   GLUCAP 126 (H) 09/23/2016   GLUCAP 144 (H) 09/09/2016   GLUCAP 129 (H) 09/06/2016   GLUCAP 121 (H) 08/30/2016   GLUCAP 142 (H) 08/30/2016     Exercise Target Goals:    Exercise Program Goal: Individual exercise prescription set with THRR, safety & activity barriers. Participant demonstrates ability to understand and report RPE using BORG scale, to self-measure pulse accurately, and to acknowledge the importance of the exercise prescription.  Exercise Prescription Goal: Starting with aerobic activity 30 plus minutes a day, 3 days per week for initial exercise prescription. Provide home exercise prescription and guidelines that participant acknowledges understanding prior to discharge.  Activity Barriers & Risk Stratification:     Activity Barriers & Cardiac Risk Stratification - 08/20/16 0820      Activity Barriers & Cardiac Risk Stratification   Activity Barriers Deconditioning;Shortness of Breath;Muscular Weakness   Cardiac Risk Stratification High      6 Minute Walk:     6 Minute Walk    Row Name 08/20/16 1034         6 Minute Walk   Phase Initial     Distance 1371 feet     Walk Time 6 minutes     # of Rest Breaks 0     MPH 2.59     METS 3.02     RPE 11     VO2 Peak 10.56     Symptoms No     Resting HR 70 bpm     Resting BP 142/80     Max Ex. HR 103 bpm     Max Ex. BP 176/76     2 Minute Post BP 130/80        Oxygen Initial Assessment:   Oxygen Re-Evaluation:   Oxygen  Discharge (Final Oxygen Re-Evaluation):   Initial Exercise Prescription:     Initial Exercise Prescription - 08/20/16 1100      Date of Initial Exercise RX and Referring Provider   Date 08/20/16   Referring Provider [name] MD, and [name] MD     Treadmill   MPH 2.3   Grade 1   Minutes 10   METs 3.08     Bike   Level 0.8   Minutes 10   METs 1.79     NuStep   Level 3   SPM 70   Minutes 10   METs 2     Prescription Details   Frequency (times per week) 3   Duration Progress to 30 minutes of continuous aerobic without signs/symptoms of physical distress     Intensity   THRR 40-80% of Max Heartrate 60-119   Ratings of Perceived Exertion 11-13   Perceived Dyspnea 0-4     Progression   Progression Continue to progress workloads to maintain intensity without signs/symptoms of physical distress.     Resistance Training   Training Prescription Yes   Weight 3lbs   Reps 10-15      Perform Capillary Blood Glucose checks as needed.  Exercise Prescription Changes:     Exercise Prescription Changes    Row Name 09/25/16 1400  Response to Exercise   Blood Pressure (Admit) 144/68       Blood Pressure (Exercise) 134/64       Blood Pressure (Exit) 138/72       Heart Rate (Admit) 82 bpm       Heart Rate (Exercise) 118 bpm       Heart Rate (Exit) 92 bpm       Rating of Perceived Exertion (Exercise) 12       Duration Progress to 45 minutes of aerobic exercise without signs/symptoms of physical distress       Intensity THRR unchanged         Progression   Progression Continue to progress workloads to maintain intensity without signs/symptoms of physical distress.       Average METs 2.6         Resistance Training   Training Prescription Yes       Weight 3lbs       Reps 10-15         Bike   Level 1       Minutes 10       METs 2.58         NuStep   Level 4       SPM 70       Minutes 10       METs 2.2         Track   Laps 9        Minutes 10       METs 2.57         Home Exercise Plan   Plans to continue exercise at Home (comment)       Frequency Add 3 additional days to program exercise sessions.          Exercise Comments:     Exercise Comments    Row Name 09/25/16 1423           Exercise Comments Pt is doing well with exercise.           Exercise Goals and Review:     Exercise Goals    Row Name 08/20/16 0820             Exercise Goals   Increase Physical Activity Yes       Intervention Provide advice, education, support and counseling about physical activity/exercise needs.;Develop an individualized exercise prescription for aerobic and resistive training based on initial evaluation findings, risk stratification, comorbidities and participant's personal goals.       Expected Outcomes Achievement of increased cardiorespiratory fitness and enhanced flexibility, muscular endurance and strength shown through measurements of functional capacity and personal statement of participant.       Increase Strength and Stamina Yes       Intervention Provide advice, education, support and counseling about physical activity/exercise needs.;Develop an individualized exercise prescription for aerobic and resistive training based on initial evaluation findings, risk stratification, comorbidities and participant's personal goals.       Expected Outcomes Achievement of increased cardiorespiratory fitness and enhanced flexibility, muscular endurance and strength shown through measurements of functional capacity and personal statement of participant.          Exercise Goals Re-Evaluation :     Exercise Goals Re-Evaluation    Row Name 09/25/16 1421             Exercise Goal Re-Evaluation   Exercise Goals Review Increase Physical Activity;Increase Strenth and Stamina       Comments pt is doing okay with  exercise, his attendance is sporadic and he c/o not feeling well during his last visit so he wasn't able to  exercise       Expected Outcomes Encouraged pt to get some rest and come back to CR when he is feeling better.  continue with exercise Rx and increase workloads as tolerated           Discharge Exercise Prescription (Final Exercise Prescription Changes):     Exercise Prescription Changes - 09/25/16 1400      Response to Exercise   Blood Pressure (Admit) 144/68   Blood Pressure (Exercise) 134/64   Blood Pressure (Exit) 138/72   Heart Rate (Admit) 82 bpm   Heart Rate (Exercise) 118 bpm   Heart Rate (Exit) 92 bpm   Rating of Perceived Exertion (Exercise) 12   Duration Progress to 45 minutes of aerobic exercise without signs/symptoms of physical distress   Intensity THRR unchanged     Progression   Progression Continue to progress workloads to maintain intensity without signs/symptoms of physical distress.   Average METs 2.6     Resistance Training   Training Prescription Yes   Weight 3lbs   Reps 10-15     Bike   Level 1   Minutes 10   METs 2.58     NuStep   Level 4   SPM 70   Minutes 10   METs 2.2     Track   Laps 9   Minutes 10   METs 2.57     Home Exercise Plan   Plans to continue exercise at Home (comment)   Frequency Add 3 additional days to program exercise sessions.      Nutrition:  Target Goals: Understanding of nutrition guidelines, daily intake of sodium 1500mg , cholesterol 200mg , calories 30% from fat and 7% or less from saturated fats, daily to have 5 or more servings of fruits and vegetables.  Biometrics:     Pre Biometrics - 08/20/16 1034      Pre Biometrics   Height 6' 1.75" (1.873 m)   Weight 260 lb 2.3 oz (118 kg)   Waist Circumference 46 inches   Hip Circumference 44 inches   Waist to Hip Ratio 1.05 %   BMI (Calculated) 33.7   Triceps Skinfold 25 mm   % Body Fat 39.1 %   Grip Strength 47 kg   Flexibility 13 in   Single Leg Stand 8.24 seconds       Nutrition Therapy Plan and Nutrition Goals:   Nutrition Discharge:  Nutrition Scores:   Nutrition Goals Re-Evaluation:   Nutrition Goals Re-Evaluation:   Nutrition Goals Discharge (Final Nutrition Goals Re-Evaluation):   Psychosocial: Target Goals: Acknowledge presence or absence of significant depression and/or stress, maximize coping skills, provide positive support system. Participant is able to verbalize types and ability to use techniques and skills needed for reducing stress and depression.  Initial Review & Psychosocial Screening:     Initial Psych Review & Screening - 08/26/16 1628      Family Dynamics   Good Support System? Yes     Barriers   Psychosocial barriers to participate in program There are no identifiable barriers or psychosocial needs.     Screening Interventions   Interventions Encouraged to exercise      Quality of Life Scores:   PHQ-9: Recent Review Flowsheet Data    Depression screen [facility] 2/9 08/26/2016 07/18/2014   Decreased Interest 0 0   Down, Depressed, Hopeless 0 0   PHQ -  2 Score 0 0     Interpretation of Total Score  Total Score Depression Severity:  1-4 = Minimal depression, 5-9 = Mild depression, 10-14 = Moderate depression, 15-19 = Moderately severe depression, 20-27 = Severe depression   Psychosocial Evaluation and Intervention:   Psychosocial Re-Evaluation:     Psychosocial Re-Evaluation    Row Name 09/25/16 1816             Psychosocial Re-Evaluation   Current issues with None Identified       Interventions Encouraged to attend Cardiac Rehabilitation for the exercise       Continue Psychosocial Services  No Follow up required          Psychosocial Discharge (Final Psychosocial Re-Evaluation):     Psychosocial Re-Evaluation - 09/25/16 1816      Psychosocial Re-Evaluation   Current issues with None Identified   Interventions Encouraged to attend Cardiac Rehabilitation for the exercise   Continue Psychosocial Services  No Follow up required      Vocational  Rehabilitation: Provide vocational rehab assistance to qualifying candidates.   Vocational Rehab Evaluation & Intervention:     Vocational Rehab - 08/26/16 1629      Initial Vocational Rehab Evaluation & Intervention   Assessment shows need for Vocational Rehabilitation No  Lonie is retired and does not need voational rehab at this time      Education: Education Goals: Education classes will be provided on a weekly basis, covering required topics. Participant will state understanding/return demonstration of topics presented.  Learning Barriers/Preferences:     Learning Barriers/Preferences - 08/20/16 0819      Learning Barriers/Preferences   Learning Barriers Hearing;Sight  cataract surgery   Learning Preferences Written Material      Education Topics: Count Your Pulse:  -Group instruction provided by verbal instruction, demonstration, patient participation and written materials to support subject.  Instructors address importance of being able to find your pulse and how to count your pulse when at home without a heart monitor.  Patients get hands on experience counting their pulse with staff help and individually.   CARDIAC REHAB PHASE II EXERCISE from 09/06/2016 in [place]  Date  09/06/16  Instruction Review Code  2- meets goals/outcomes      Heart Attack, Angina, and Risk Factor Modification:  -Group instruction provided by verbal instruction, video, and written materials to support subject.  Instructors address signs and symptoms of angina and heart attacks.    Also discuss risk factors for heart disease and how to make changes to improve heart health risk factors.   Functional Fitness:  -Group instruction provided by verbal instruction, demonstration, patient participation, and written materials to support subject.  Instructors address safety measures for doing things around the house.  Discuss how to get up and down off the floor, how  to pick things up properly, how to safely get out of a chair without assistance, and balance training.   Meditation and Mindfulness:  -Group instruction provided by verbal instruction, patient participation, and written materials to support subject.  Instructor addresses importance of mindfulness and meditation practice to help reduce stress and improve awareness.  Instructor also leads participants through a meditation exercise.    Stretching for Flexibility and Mobility:  -Group instruction provided by verbal instruction, patient participation, and written materials to support subject.  Instructors lead participants through series of stretches that are designed to increase flexibility thus improving mobility.  These stretches are additional exercise for major  muscle groups that are typically performed during regular warm up and cool down.   CARDIAC REHAB PHASE II EXERCISE from 09/06/2016 in [place]  Date  08/30/16  Educator  Dorna Bloom, [place] ACSM RCEP  Instruction Review Code  1- partially meets, needs review/practice      Hands Only CPR:  -Group verbal, video, and participation provides a basic overview of AHA guidelines for community CPR. Role-play of emergencies allow participants the opportunity to practice calling for help and chest compression technique with discussion of AED use.   Hypertension: -Group verbal and written instruction that provides a basic overview of hypertension including the most recent diagnostic guidelines, risk factor reduction with self-care instructions and medication management.    Nutrition I class: Heart Healthy Eating:  -Group instruction provided by PowerPoint slides, verbal discussion, and written materials to support subject matter. The instructor gives an explanation and review of the Therapeutic Lifestyle Changes diet recommendations, which includes a discussion on lipid goals, dietary fat, sodium, fiber, plant  stanol/sterol esters, sugar, and the components of a well-balanced, healthy diet.   Nutrition II class: Lifestyle Skills:  -Group instruction provided by PowerPoint slides, verbal discussion, and written materials to support subject matter. The instructor gives an explanation and review of label reading, grocery shopping for heart health, heart healthy recipe modifications, and ways to make healthier choices when eating out.   Diabetes Question & Answer:  -Group instruction provided by PowerPoint slides, verbal discussion, and written materials to support subject matter. The instructor gives an explanation and review of diabetes co-morbidities, pre- and post-prandial blood glucose goals, pre-exercise blood glucose goals, signs, symptoms, and treatment of hypoglycemia and hyperglycemia, and foot care basics.   Diabetes Blitz:  -Group instruction provided by PowerPoint slides, verbal discussion, and written materials to support subject matter. The instructor gives an explanation and review of the physiology behind type 1 and type 2 diabetes, diabetes medications and rational behind using different medications, pre- and post-prandial blood glucose recommendations and Hemoglobin A1c goals, diabetes diet, and exercise including blood glucose guidelines for exercising safely.    Portion Distortion:  -Group instruction provided by PowerPoint slides, verbal discussion, written materials, and food models to support subject matter. The instructor gives an explanation of serving size versus portion size, changes in portions sizes over the last 20 years, and what consists of a serving from each food group.   Stress Management:  -Group instruction provided by verbal instruction, video, and written materials to support subject matter.  Instructors review role of stress in heart disease and how to cope with stress positively.     Exercising on Your Own:  -Group instruction provided by verbal instruction,  power point, and written materials to support subject.  Instructors discuss benefits of exercise, components of exercise, frequency and intensity of exercise, and end points for exercise.  Also discuss use of nitroglycerin and activating EMS.  Review options of places to exercise outside of rehab.  Review guidelines for sex with heart disease.   Cardiac Drugs I:  -Group instruction provided by verbal instruction and written materials to support subject.  Instructor reviews cardiac drug classes: antiplatelets, anticoagulants, beta blockers, and statins.  Instructor discusses reasons, side effects, and lifestyle considerations for each drug class.   Cardiac Drugs II:  -Group instruction provided by verbal instruction and written materials to support subject.  Instructor reviews cardiac drug classes: angiotensin converting enzyme inhibitors (ACE-I), angiotensin II receptor blockers (ARBs), nitrates, and calcium channel blockers.  Instructor discusses reasons, side effects, and lifestyle considerations for each drug class.   Anatomy and Physiology of the Circulatory System:  Group verbal and written instruction and models provide basic cardiac anatomy and physiology, with the coronary electrical and arterial systems. Review of: AMI, Angina, Valve disease, Heart Failure, Peripheral Artery Disease, Cardiac Arrhythmia, Pacemakers, and the ICD.   Other Education:  -Group or individual verbal, written, or video instructions that support the educational goals of the cardiac rehab program.   Knowledge Questionnaire Score:   Core Components/Risk Factors/Patient Goals at Admission:     Personal Goals and Risk Factors at Admission - 08/20/16 1114      Core Components/Risk Factors/Patient Goals on Admission    Weight Management Yes;Obesity;Weight Loss;Weight Maintenance   Intervention Weight Management: Develop a combined nutrition and exercise program designed to reach desired caloric intake, while  maintaining appropriate intake of nutrient and fiber, sodium and fats, and appropriate energy expenditure required for the weight goal.;Weight Management: Provide education and appropriate resources to help participant work on and attain dietary goals.;Weight Management/Obesity: Establish reasonable short term and long term weight goals.;Obesity: Provide education and appropriate resources to help participant work on and attain dietary goals.   Expected Outcomes Short Term: Continue to assess and modify interventions until short term weight is achieved;Long Term: Adherence to nutrition and physical activity/exercise program aimed toward attainment of established weight goal;Weight Maintenance: Understanding of the daily nutrition guidelines, which includes 25-35% calories from fat, 7% or less cal from saturated fats, less than 200mg  cholesterol, less than 1.5gm of sodium, & 5 or more servings of fruits and vegetables daily;Weight Loss: Understanding of general recommendations for a balanced deficit meal plan, which promotes 1-2 lb weight loss per week and includes a negative energy balance of [phone] kcal/d;Understanding recommendations for meals to include 15-35% energy as protein, 25-35% energy from fat, 35-60% energy from carbohydrates, less than 200mg  of dietary cholesterol, 20-35 gm of total fiber daily;Understanding of distribution of calorie intake throughout the day with the consumption of 4-5 meals/snacks   Diabetes Yes   Intervention Provide education about signs/symptoms and action to take for hypo/hyperglycemia.;Provide education about proper nutrition, including hydration, and aerobic/resistive exercise prescription along with prescribed medications to achieve blood glucose in normal ranges: Fasting glucose 65-99 mg/dL   Expected Outcomes Short Term: Participant verbalizes understanding of the signs/symptoms and immediate care of hyper/hypoglycemia, proper foot care and importance of medication,  aerobic/resistive exercise and nutrition plan for blood glucose control.;Long Term: Attainment of HbA1C < 7%.   Hypertension Yes   Intervention Provide education on lifestyle modifcations including regular physical activity/exercise, weight management, moderate sodium restriction and increased consumption of fresh fruit, vegetables, and low fat dairy, alcohol moderation, and smoking cessation.;Monitor prescription use compliance.   Expected Outcomes Short Term: Continued assessment and intervention until BP is < 140/54mm HG in hypertensive participants. < 130/74mm HG in hypertensive participants with diabetes, heart failure or chronic kidney disease.;Long Term: Maintenance of blood pressure at goal levels.   Lipids Yes   Intervention Provide education and support for participant on nutrition & aerobic/resistive exercise along with prescribed medications to achieve LDL 70mg , HDL >40mg .   Expected Outcomes Short Term: Participant states understanding of desired cholesterol values and is compliant with medications prescribed. Participant is following exercise prescription and nutrition guidelines.;Long Term: Cholesterol controlled with medications as prescribed, with individualized exercise RX and with personalized nutrition plan. Value goals: LDL < 70mg , HDL > 40 mg.      Core Components/Risk Factors/Patient  Goals Review:    Core Components/Risk Factors/Patient Goals at Discharge (Final Review):    ITP Comments:     ITP Comments    Row Name 08/20/16 0802           ITP Comments Dr. Fransico Him, Medical Director           Comments: Karandeep is making expected progress toward personal goals after completing 6 sessions. Recommend continued exercise and life style modification education including  stress management and relaxation techniques to decrease cardiac risk profile. Rodrickus's attendance has been sporadic. Will have Owynn hold off on returning to cardiac rehab until he has been evaluated by  the physician extender at Dr  Allison Quarry office.[name], RN,BSN 09/25/2016 6:20 PM

## 2016-09-27 ENCOUNTER — Encounter (HOSPITAL_COMMUNITY): Payer: Medicare Other

## 2016-09-30 ENCOUNTER — Encounter (HOSPITAL_COMMUNITY): Payer: Medicare Other

## 2016-10-02 ENCOUNTER — Ambulatory Visit: Payer: Medicare Other | Admitting: Cardiology

## 2016-10-02 ENCOUNTER — Ambulatory Visit (INDEPENDENT_AMBULATORY_CARE_PROVIDER_SITE_OTHER): Payer: Medicare Other | Admitting: Cardiology

## 2016-10-02 ENCOUNTER — Encounter (HOSPITAL_COMMUNITY): Payer: Medicare Other

## 2016-10-02 ENCOUNTER — Other Ambulatory Visit (HOSPITAL_COMMUNITY): Payer: Medicare Other

## 2016-10-02 VITALS — BP 122/68 | HR 85 | Ht 75.0 in | Wt 264.0 lb

## 2016-10-02 DIAGNOSIS — I2510 Atherosclerotic heart disease of native coronary artery without angina pectoris: Secondary | ICD-10-CM

## 2016-10-02 DIAGNOSIS — I5032 Chronic diastolic (congestive) heart failure: Secondary | ICD-10-CM

## 2016-10-02 DIAGNOSIS — R0609 Other forms of dyspnea: Secondary | ICD-10-CM

## 2016-10-02 DIAGNOSIS — J849 Interstitial pulmonary disease, unspecified: Secondary | ICD-10-CM | POA: Diagnosis not present

## 2016-10-02 DIAGNOSIS — I209 Angina pectoris, unspecified: Secondary | ICD-10-CM | POA: Diagnosis not present

## 2016-10-02 DIAGNOSIS — I25119 Atherosclerotic heart disease of native coronary artery with unspecified angina pectoris: Secondary | ICD-10-CM | POA: Diagnosis not present

## 2016-10-02 DIAGNOSIS — E860 Dehydration: Secondary | ICD-10-CM | POA: Diagnosis not present

## 2016-10-02 DIAGNOSIS — Z9861 Coronary angioplasty status: Secondary | ICD-10-CM

## 2016-10-02 MED ORDER — DILTIAZEM HCL ER COATED BEADS 120 MG PO CP24: 120.0000 mg | ORAL_CAPSULE | Freq: Every day | ORAL | 3 refills | Status: DC

## 2016-10-02 NOTE — Progress Notes (Signed)
PCP: [facility] Note: Chief Complaint  Patient presents with  . Follow-up    pt c/o fatigue and no energy, pt denied chest pain  . Coronary Artery Disease  . Atrial Flutter    s/p ablation    HPI: Kenneth [name] is a 71 y.o. male with a PMH below who presents today for Reevaluation due to extreme fatigue. I just saw him a couple weeks ago for follow-up of his CAD with PCI to LAD and RCA. Unfortunately, after initially feeling better, he still has noted fatigue. Based on elevated diastolic pressures and diastolic dysfunction on his ischemic evaluations, I added losartan in lieu of diltiazem last visit.  Apparently at cardiac rehabilitation last week, he noted worsening fatigue. He was told by Almyra Deforest, PA to stop the losartan altogether because of hypotension - he actually has continued to take it because he was not sure what he should do.  Interval History: High presents today still just really fatigue he just is very upset and concerned because he just has no energy. He initially felt pretty good after his stents, but may have been discussed was full thinking. He feels tired and fatigued with no energy. He is not able to do any of his chores. He has not had any chest tightness or pressure with rest or exertion, just feels a little bit short of breath. No PND, orthopnea or edema.  He does note some mild positional dizziness is probably related to his blood pressure, has not had any syncope and near syncope. He denies any PND, orthopnea or edema. No palpitations or rapid irregular heartbeats.   No lightheadedness, dizziness, weakness or syncope/near syncope. No TIA/amaurosis fugax symptoms. No melena, hematochezia, hematuria, or epstaxis. No claudication.  ROS: A comprehensive was performed. Review of Systems  Constitutional: Positive for malaise/fatigue. Negative for diaphoresis.  HENT: Negative for nosebleeds.   Respiratory: Negative for cough.     Cardiovascular: Negative for leg swelling.  Genitourinary: Negative for hematuria.  Musculoskeletal: Negative for joint pain.  Neurological: Positive for dizziness (Positional).  Psychiatric/Behavioral: The patient does not have insomnia.        Starting to get frustrated. Maybe a little depressed  All other systems reviewed and are negative.    I have reviewed and (if needed) personally updated the patient's problem list, medications, allergies, past medical and surgical history, social and family history.   Past Medical History:  Diagnosis Date  . Adenomatous polyp of colon 2006  . Anemia   . Arthritis    "qwhere" (06/28/2016)  . CAD S/P percutaneous coronary angioplasty 06/2016   06/28/16 PCI with Synergy DES 2.75 mm 20 mm-->m RCA; 07/10/16 PCI with Synergy DES 3.5 mm x 20 mm-->mLAD  . Diabetes mellitus without complication ([place])    "suppose to be on Metformin but doesn't take it" (06/28/2016)  . Diverticulosis of colon (without mention of hemorrhage) 1999, 2006  . Dyspepsia   . Epiretinal membrane    "epiretinal attachment" (06/28/2016)  . Esophagitis 1999  . GERD (gastroesophageal reflux disease)   . Heart murmur   . Hiatal hernia 1999  . History of kidney stones   . HOH (hard of hearing)    "wears aids"   . Hyperlipidemia   . Hypertension   . 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 Paroxysmal atrial fibrillation ([place])    a. 11/2003 Tikosyn initiated - subsequently d/c'd;  b. 2006 RFCA  for Afib @ [facility];  c. 09/2011 Echo: EF 60-65%, Gr 2 DD;  d. 10/2011 DCCV  . Pneumonia 1990s X 1  . Psoriasis 1967  . Rheumatic fever ~ 1958  . Seasonal allergies   . Skin cancer    on leg, left arm  . Sleep apnea    patient has had the sleep test, but states they said it didn't need treatment; "will repeat test at home" (06/28/2016)    Past Surgical History:  Procedure Laterality Date  . APPENDECTOMY  1996  . ATRIAL FIBRILLATION ABLATION N/A 01/13/2014    repeat PVI, also left atrial ablation performed with successfull ablation of LA flutter by Dr Rayann Heman  . ATRIAL FIBRILLATION ABLATION  2006   Dr Rolland Porter at [facility]  . CARDIAC CATHETERIZATION  2005  . CARDIOVERSION N/A 05/22/2013   Procedure: CARDIOVERSION;  Surgeon: [name], MD;  Location: [facility];  Service: Cardiovascular;  Laterality: N/A;  . CARDIOVERSION N/A 10/23/2011   Procedure: CARDIOVERSION;  Surgeon: Deboraha Sprang, MD;  Location: [facility] CATH LAB;  Service: Cardiovascular;  Laterality: N/A;  . CARDIOVERSION N/A 05/01/2012   Procedure: CARDIOVERSION;  Surgeon: Deboraha Sprang, MD;  Location: [facility] CATH LAB;  Service: Cardiovascular;  Laterality: N/A;  . CARPAL TUNNEL RELEASE Right 1980s?  Marland Kitchen CATARACT EXTRACTION W/ INTRAOCULAR LENS  IMPLANT, BILATERAL Bilateral   . COLONOSCOPY    . CORONARY STENT INTERVENTION N/A 06/28/2016   Procedure: Coronary Stent Intervention;  Surgeon: Leonie Man, MD;  Location: [facility] CV LAB;  Service: Cardiovascular;  Laterality: mRCA PCI -Synergy DES 2.75 m x 20 mm (3.1 mm)  . CORONARY STENT INTERVENTION N/A 07/10/2016   Procedure: Coronary Stent Intervention;  Surgeon: Leonie Man, MD;  Location: [facility] CV LAB;  Service: Cardiovascular: mLAD PCI Synergy DES 3.5 mm x 20 mm)  . CYSTOSCOPY W/ STONE MANIPULATION  2010   Patient states he has had several kidney stones up until aroune 2010, none since (06/28/2016)  . ESOPHAGOGASTRODUODENOSCOPY    . INTRAVASCULAR PRESSURE WIRE/FFR STUDY N/A 06/28/2016   Procedure: Intravascular Pressure Wire/FFR Study;  Surgeon: Leonie Man, MD;  Location: [facility] CV LAB;  Service: Cardiovascular: FFR of mLAD ~65% lesion = 0.75 post --> Staged PCI  . NASAL SINUS SURGERY     x 2  . RIGHT/LEFT HEART CATH AND CORONARY ANGIOGRAPHY N/A 06/28/2016   Procedure: Right/Left Heart Cath and Coronary Angiography;  Surgeon: Leonie Man, MD;  Location: [facility] CV LAB;  Service: Cardiovascular: mRCA 99% (TIMI 2), mLAD ~65% (FFR  0.75), OM3 55%. mild Pulm HTN. Mod LVEDP elevation. EF 55-65%.  . SHOULDER OPEN ROTATOR CUFF REPAIR Bilateral   . TONSILLECTOMY     Diagnostic Diagram 06/28/2016                                             Post-Intervention Diagram 07/10/2016 PCI mRCA Synergy 2.75 x 20                                              PCI mLAD Synergy DES 3.5 x 20                Current Meds  Medication Sig  . acetaminophen (TYLENOL) 500 MG tablet  Take 1,000 mg by mouth every 6 (six) hours as needed (for pain.).  Marland Kitchen aspirin 81 MG chewable tablet Chew 1 tablet (81 mg total) by mouth daily.  . Biotin 1000 MCG tablet Take 1,000 mcg by mouth at bedtime.  . calcipotriene (DOVONOX) 0.005 % cream Apply 1 application topically 2 (two) times daily as needed (for psoriasis.).  Marland Kitchen Certolizumab Pegol (CIMZIA [place]) Inject 200 mg/kg into the skin every 21 ( twenty-one) days.  . cetirizine (ZYRTEC) 10 MG tablet Take 10 mg by mouth daily.  . clobetasol cream (TEMOVATE) 5.18 % Apply 1 application topically 2 (two) times daily as needed (for psoriasis).  . clopidogrel (PLAVIX) 75 MG tablet Take 1 tablet (75 mg total) by mouth daily with breakfast.  . Flurandrenolide (CORDRAN) 4 MCG/SQCM TAPE Apply 1 each topically at bedtime. Applied to areas affected by psoriasis.  Javier Docker Oil 500 MG CAPS Take 500 mg by mouth at bedtime.  . magnesium oxide (MAG-OX) 400 MG tablet Take 400 mg by mouth daily.  . metFORMIN (GLUCOPHAGE) 500 MG tablet Take by mouth 2 (two) times daily with a meal.  . nitroGLYCERIN (NITROSTAT) 0.4 MG SL tablet Place 1 tablet (0.4 mg total) under the tongue every 5 (five) minutes as needed for chest pain.  . pantoprazole (PROTONIX) 40 MG tablet Take 1 tablet (40 mg total) by mouth daily.  Vladimir Faster Glycol-Propyl Glycol (SYSTANE) 0.4-0.3 % GEL ophthalmic gel Place 1 application into both eyes at bedtime.  . pravastatin (PRAVACHOL) 40 MG tablet Take 40 mg by mouth at bedtime.  . predniSONE (DELTASONE) 5 MG tablet TAKE ONE  TABLET BY MOUTH ONCE DAILY WITH BREAKFAST  . Probiotic Product ([place]) CAPS Take 1 capsule by mouth at bedtime.  Marland Kitchen Ustekinumab 45 MG/0.5ML SOLN Inject 45 mg into the skin every 3 (three) months. Taking every month Right now for 1 more dose  . valsartan (DIOVAN) 40 MG tablet Take 1 tablet (40 mg total) by mouth daily.  Marland Kitchen zolpidem (AMBIEN) 10 MG tablet Take 5-10 mg by mouth at bedtime as needed for sleep.  . [DISCONTINUED] carvedilol (COREG) 3.125 MG tablet Take 1 tablet (3.125 mg total) by mouth 2 (two) times daily with a meal.    Allergies  Allergen Reactions  . Hydrocodone-Acetaminophen Shortness Of Breath    In combination with decongestants   . Other Palpitations and Other (See Comments)    ALL DECONGESTANTS - CAUSE PALPITATIONS; THROWS HEART RHYTHM OUT OF BALANCE  . Oxycodone-Acetaminophen Shortness Of Breath  . Pseudoephedrine Palpitations  . Tramadol Shortness Of Breath and Rash    flushing    Social History   Social History  . Marital status: Married    Spouse name: N/A  . Number of children: 1  . Years of education: N/A   Occupational History  . Retired Armed forces operational officer    Social History Main Topics  . Smoking status: Former Smoker    Years: 10.00    Types: Cigars    Quit date: 09/29/2001  . Smokeless tobacco: Former Systems developer    Types: Snuff, Chew    Quit date: 09/29/2001  . Alcohol use No  . Drug use: No  . Sexual activity: Not Asked   Other Topics Concern  . None   Social History Narrative   Pt lives in [place] with spouse.     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    family history includes Breast cancer in his mother; Colon cancer in his mother; Coronary  artery disease in his mother and paternal grandfather; Heart disease in his maternal grandmother; Stroke in his paternal grandfather.  Wt Readings from Last 3 Encounters:  10/02/16 264 lb (119.7 kg)  09/17/16 262 lb 6.4 oz (119 kg)  08/20/16 260 lb 2.3 oz (118 kg)     PHYSICAL EXAM BP 122/68   Pulse 85   Ht 6\' 3"  (1.905 m)   Wt 264 lb (119.7 kg)   BMI 33.00 kg/m  General appearance: alert, cooperative, appears stated age, no distress. mildly obese HEENT: [place]/AT, EOMI, MMM, anicteric sclera Neck: no adenopathy, no carotid bruit and no JVD Lungs: clear to auscultation bilaterally, normal percussion bilaterally and non-labored Heart: RRR, normal S1 & S2. No R/G. ~2/6 early peaking c-d SEM @ [facility] Abdomen: soft, non-tender; bowel sounds normal; no masses,  no organomegaly; no HJR Extremities: extremities normal, atraumatic, no cyanosis, and edema - trivial Pulses: 2+ and symmetric; Skin:  - mild bruising Neurologic: Mental status: Alert & oriented x 3, thought content appropriate; non-focal exam.  Pleasant mood & affect.   Adult ECG Report n/a  Other studies Reviewed: Additional studies/ records that were reviewed today include:  Recent Labs:  n/a  ASSESSMENT / PLAN: Problem List Items Addressed This Visit    CAD S/P percutaneous coronary angioplasty (Chronic)    Recent PCI on good medications without significant, Plavix, beta blocker and statin however with persistent symptoms now shortly after PCI, there is a low threshold to consider relook cath.      Relevant Medications   diltiazem (CARDIZEM CD) 120 MG 24 hr capsule   Coronary artery disease involving native coronary artery with angina pectoris (HCC) (Chronic)    Status post PCI to per CRC of any LAD in a staged fashion. It seems like that angina type symptoms has improved, but he still has exertional dyspnea and fatigue. I think if he doesn't get better with the changes were make we need to strongly consider relook catheterization to ensure that his stents are patent. Continues to be on aspirin, Plavix and accommodation carvedilol with statin. We have stopped his diltiazem so he therefore is now on ARB.  - Think for now just tease things off we will have him simply stop Diovan and then  eventually stop carvedilol with plans to just restart diltiazem alone. I cannot tell for sure when the carvedilol was actually started. He seems to think that a lot of his symptoms began when his medications were adjusted. I will try to get him back to where he was. We will then follow-up shortly in order to determine if his symptoms improve. If not we would consider relook catheterization.      Relevant Medications   diltiazem (CARDIZEM CD) 120 MG 24 hr capsule   Dehydration - Primary    He's had a history of dehydration, and it may be partly the answer here. I wanted a new state [place] hydrated. He is not on diuretic all-purpose. I think that may be partially why he is having some hypotension episodes.      Diastolic CHF, chronic (HCC) (Chronic)    He had elevated LVEDP in the Cath Lab and the intention was afterload reduction with ARB, however he seems to be not doing well with the ARB. We will simply stop that along with the beta blocker and use diltiazem instead to see if this helps alleviate some of fatigue. Start until he is not any breakthrough of A. fib. Therefore rate control is not overly important.  He doesn't have signs of PND, orthopnea or edema. He may does have dyspnea related to diastolic dysfunction.      Relevant Medications   diltiazem (CARDIZEM CD) 120 MG 24 hr capsule   Exertional dyspnea (Chronic)    Difficult to tell what is now re-exacerbation of symptoms. Making a few medication adjustments at this point, but low threshold for considering reevaluation in the Cath Lab.      ILD (interstitial lung disease) ([place]) (Chronic)    He remains on prednisone for his lung condition management of pulmonary medicine.  Cannot exclude that his fatigue and dyspnea is related to this as opposed to coronary disease. I just want to simply exclude coronary disease going forward         Current medicines are reviewed at length with the patient today. (+/- concerns) no The  following changes have been made: N/A  Patient Instructions  MEDICATION  STOP  LOSARTAN TAKE CARVEDILOL  ONCE A DAY UNTIL  NEXT Tuesday THEN STOP.  RESTART TOMORROW DILTIAZEM CD 120 MG ONE CAPSULE DAILY     Your physician recommends that you schedule a follow-up appointment in [place].    Studies Ordered:   No orders of the defined types were placed in this encounter.     [name], M.D., M.S. Interventional Cardiologist   Pager # [phone] Phone # [phone] [address]. [place], [place] [address]

## 2016-10-02 NOTE — Patient Instructions (Signed)
MEDICATION  STOP  LOSARTAN TAKE CARVEDILOL  ONCE A DAY UNTIL  NEXT Tuesday THEN STOP.  RESTART TOMORROW DILTIAZEM CD 120 MG ONE CAPSULE DAILY     Your physician recommends that you schedule a follow-up appointment in [place].

## 2016-10-04 ENCOUNTER — Encounter: Payer: Self-pay | Admitting: Cardiology

## 2016-10-04 ENCOUNTER — Encounter (HOSPITAL_COMMUNITY): Payer: Medicare Other

## 2016-10-04 ENCOUNTER — Telehealth (HOSPITAL_COMMUNITY): Payer: Self-pay | Admitting: *Deleted

## 2016-10-04 NOTE — Assessment & Plan Note (Signed)
He remains on prednisone for his lung condition management of pulmonary medicine.  Cannot exclude that his fatigue and dyspnea is related to this as opposed to coronary disease. I just want to simply exclude coronary disease going forward

## 2016-10-04 NOTE — Assessment & Plan Note (Signed)
Recent PCI on good medications without significant, Plavix, beta blocker and statin however with persistent symptoms now shortly after PCI, there is a low threshold to consider relook cath.

## 2016-10-04 NOTE — Assessment & Plan Note (Signed)
He had elevated LVEDP in the Cath Lab and the intention was afterload reduction with ARB, however he seems to be not doing well with the ARB. We will simply stop that along with the beta blocker and use diltiazem instead to see if this helps alleviate some of fatigue. Start until he is not any breakthrough of A. fib. Therefore rate control is not overly important.  He doesn't have signs of PND, orthopnea or edema. He may does have dyspnea related to diastolic dysfunction.

## 2016-10-04 NOTE — Assessment & Plan Note (Signed)
Difficult to tell what is now re-exacerbation of symptoms. Making a few medication adjustments at this point, but low threshold for considering reevaluation in the Cath Lab.

## 2016-10-04 NOTE — Assessment & Plan Note (Signed)
He's had a history of dehydration, and it may be partly the answer here. I wanted a new state [place] hydrated. He is not on diuretic all-purpose. I think that may be partially why he is having some hypotension episodes.

## 2016-10-04 NOTE — Assessment & Plan Note (Signed)
Status post PCI to per CRC of any LAD in a staged fashion. It seems like that angina type symptoms has improved, but he still has exertional dyspnea and fatigue. I think if he doesn't get better with the changes were make we need to strongly consider relook catheterization to ensure that his stents are patent. Continues to be on aspirin, Plavix and accommodation carvedilol with statin. We have stopped his diltiazem so he therefore is now on ARB.  - Think for now just tease things off we will have him simply stop Diovan and then eventually stop carvedilol with plans to just restart diltiazem alone. I cannot tell for sure when the carvedilol was actually started. He seems to think that a lot of his symptoms began when his medications were adjusted. I will try to get him back to where he was. We will then follow-up shortly in order to determine if his symptoms improve. If not we would consider relook catheterization.

## 2016-10-07 ENCOUNTER — Encounter (HOSPITAL_COMMUNITY): Payer: Medicare Other

## 2016-10-11 ENCOUNTER — Encounter (HOSPITAL_COMMUNITY): Payer: Medicare Other

## 2016-10-14 ENCOUNTER — Encounter (HOSPITAL_COMMUNITY): Payer: Medicare Other

## 2016-10-16 ENCOUNTER — Telehealth: Payer: Self-pay | Admitting: Cardiology

## 2016-10-16 ENCOUNTER — Encounter (HOSPITAL_COMMUNITY): Payer: Medicare Other

## 2016-10-16 NOTE — Telephone Encounter (Signed)
New Message     Pt had 2 stents put in one on 3/23 and 4/3 pt has developed  Pt c/o Shortness Of Breath: STAT if SOB developed within the last 24 hours or pt is noticeably SOB on the phone  1. Are you currently SOB (can you hear that pt is SOB on the phone)?  No speaking with wife   2. How long have you been experiencing SOB? Since April   3. Are you SOB when sitting or when up moving around? Sob with exertion    4. Are you currently experiencing any other symptoms? Fatigue and dizziness

## 2016-10-16 NOTE — Telephone Encounter (Signed)
He is hard to figure out.  Not sure what the next move is.   Maybe we do need a monitor & maybe a ST.  [name], MD

## 2016-10-16 NOTE — Telephone Encounter (Signed)
Spoke to wife regarding symptoms, she also gave me background info:  Notes stents done in March and April. Initially patient Felt better after surgery, improvement of energy, less short of breath. She indicates that since then though, pt has had progressive SOB and very low energy. He was seen on 6/27 and Dr. Ellyn Hack reviewed, concerned d/t change in meds done in hospital, med regimen was adjusted to try to get patient back to his baseline.  Pt has been compliant w med changes, including Stopping losartan,  tapered discontinuation of carvedilol, & starting cardizem CD.  He's been having mild dizziness for unspecified period of time (wife notes pt bad about sharing symptoms with her, but she thinks for at least several days).  Reports "very noticeable dizziness today".  Voiced exhaustion, pt fell asleep at service station waiting room while getting car worked on today.  No chest pain. Denies new SOB.   BP 143/59  HR unknown -- but patient "can always tell" if he's in A Fib/flutter and voiced no symptoms of this today.  I recommended APP visit this week (their f/u w Dr. Ellyn Hack is not until August 21st). Discussed case w Isaac Laud, who is OK to see patient as add-on tomorrow.  I gave appt time to which wife agreed - she will bring patient in to office tomorrow to discuss.

## 2016-10-17 ENCOUNTER — Encounter: Payer: Self-pay | Admitting: Physician Assistant

## 2016-10-17 ENCOUNTER — Ambulatory Visit (INDEPENDENT_AMBULATORY_CARE_PROVIDER_SITE_OTHER): Payer: Medicare Other | Admitting: Physician Assistant

## 2016-10-17 ENCOUNTER — Telehealth: Payer: Self-pay | Admitting: Physician Assistant

## 2016-10-17 VITALS — BP 136/69 | HR 86 | Ht 75.0 in | Wt 259.0 lb

## 2016-10-17 DIAGNOSIS — I2510 Atherosclerotic heart disease of native coronary artery without angina pectoris: Secondary | ICD-10-CM

## 2016-10-17 DIAGNOSIS — R0602 Shortness of breath: Secondary | ICD-10-CM

## 2016-10-17 DIAGNOSIS — E785 Hyperlipidemia, unspecified: Secondary | ICD-10-CM | POA: Diagnosis not present

## 2016-10-17 DIAGNOSIS — Z9189 Other specified personal risk factors, not elsewhere classified: Secondary | ICD-10-CM | POA: Diagnosis not present

## 2016-10-17 DIAGNOSIS — I25119 Atherosclerotic heart disease of native coronary artery with unspecified angina pectoris: Secondary | ICD-10-CM

## 2016-10-17 DIAGNOSIS — R531 Weakness: Secondary | ICD-10-CM

## 2016-10-17 DIAGNOSIS — E119 Type 2 diabetes mellitus without complications: Secondary | ICD-10-CM

## 2016-10-17 DIAGNOSIS — I209 Angina pectoris, unspecified: Secondary | ICD-10-CM

## 2016-10-17 DIAGNOSIS — I480 Paroxysmal atrial fibrillation: Secondary | ICD-10-CM | POA: Diagnosis not present

## 2016-10-17 DIAGNOSIS — Z9861 Coronary angioplasty status: Secondary | ICD-10-CM

## 2016-10-17 DIAGNOSIS — I10 Essential (primary) hypertension: Secondary | ICD-10-CM | POA: Diagnosis not present

## 2016-10-17 LAB — COMPREHENSIVE METABOLIC PANEL
ALBUMIN: 4.6 g/dL (ref 3.5–4.8)
ALT: 25 IU/L (ref 0–44)
AST: 27 IU/L (ref 0–40)
Albumin/Globulin Ratio: 1.6 (ref 1.2–2.2)
Alkaline Phosphatase: 70 IU/L (ref 39–117)
BUN / CREAT RATIO: 18 (ref 10–24)
BUN: 16 mg/dL (ref 8–27)
Bilirubin Total: 0.3 mg/dL (ref 0.0–1.2)
CALCIUM: 9.4 mg/dL (ref 8.6–10.2)
CHLORIDE: 100 mmol/L (ref 96–106)
CO2: 23 mmol/L (ref 20–29)
CREATININE: 0.87 mg/dL (ref 0.76–1.27)
GFR calc non Af Amer: 87 mL/min/{1.73_m2} (ref >59)
GFR, EST AFRICAN AMERICAN: 100 mL/min/{1.73_m2} (ref >59)
GLUCOSE: 178 mg/dL — AB (ref 65–99)
Globulin, Total: 2.8 g/dL (ref 1.5–4.5)
Potassium: 4.5 mmol/L (ref 3.5–5.2)
Sodium: 139 mmol/L (ref 134–144)
TOTAL PROTEIN: 7.4 g/dL (ref 6.0–8.5)

## 2016-10-17 LAB — CBC
HEMATOCRIT: 32.1 % — AB (ref 37.5–51.0)
HEMOGLOBIN: 9.7 g/dL — AB (ref 13.0–17.7)
MCH: 22.9 pg — ABNORMAL LOW (ref 26.6–33.0)
MCHC: 30.2 g/dL — AB (ref 31.5–35.7)
MCV: 76 fL — AB (ref 79–97)
Platelets: 272 10*3/uL (ref 150–379)
RBC: 4.23 x10E6/uL (ref 4.14–5.80)
RDW: 14.7 % (ref 12.3–15.4)
WBC: 6.3 10*3/uL (ref 3.4–10.8)

## 2016-10-17 NOTE — Telephone Encounter (Signed)
Added to med list

## 2016-10-17 NOTE — Progress Notes (Signed)
Cardiology Office Note    Date:  10/18/2016   ID:  ANNA LIVERS, DOB [birth year], MRN [id number]  PCP:  [facility]  Cardiologist: Dr. Ellyn Hack (Remotely Dr. Stanford Breed in 09/2011, has been followed by EP in recently years) Atrial fibrillation clinic: Dr. Rayann Heman, Dr. Caryl Comes and Roderic Palau NP Pulmonologist: Dr. Elsworth Soho   Chief Complaint  Patient presents with  . Follow-up    having some shortness of breath    History of Present Illness:  Kenneth [name] is a 71 y.o. male with PMH of DM II, HTN, HLD, interstitial lung disease on steroid and PAF s/p ablation in 2006 and again in 2015. He had a remote cardiac catheterization in March 2005 showed normal LV function and no significant coronary artery disease. He had atrial fibrillation ablation at [facility] in 2006. Myoview obtained in August 2008 showed EF 58% and no ischemia. The last time she was seen by Dr. Stanford Breed was on 09/30/2011, since then he has been largely followed by Dr. Caryl Comes, Dr. Rayann Heman and in the atrial fibrillation clinic.  Last echocardiogram obtained on 11/15/2013  showed EF 65-70%, no regional wall abnormality, mild TR, PA peak pressure 35 mmHg, dilated ascending aorta 41 mm. She underwent atrial fibrillation and atrial flutter ablation in October 2015.  Recently, patient has been having more shortness of breath and fatigue. CT scan of the chest showed triple-vessel coronary artery disease. Patient underwent left and right heart catheterization on 06/28/2016 which showed 99% mid RCA lesion treated with 2.75 x 20 mm DES, 65% mid LAD lesion with final FFR 0.75 which was significant, 55% third OM lesion, EF 55-65%. Given the coronary artery disease, it was recommended for the patient to discontinue flecainide. He returned on 07/10/2016 and underwent staged PCI of mid LAD lesion treated with 3.5 x 20 mm DES. His DOAC was also stopped to avoid excessive bleeding risk on DOAC. If atrial fibrillation or atrial flutter become more  prevalent, then we will restarted on DOAC  I first saw the patient in April, he was not started on anticoagulation due to lack of recurrence of atrial fibrillation. Although Dr. Rayann Heman did recommend NOAC on follow-up 08/19/2016, he was very hesitant at the time. Dr. Rayann Heman recommended start of systemic anticoagulation by Dr. Ellyn Hack once antiplatelet regimen can be reduced. He was seen by Dr. Ellyn Hack on 09/17/2016, his diltiazem was stopped and valsartan 40 mg was started. About 6 days later, I received a call from cardiac rehabilitation notifying me that his blood pressure has been dropping to the low 100s at home and he became very dizzy with associated fatigue. I recommended discontinuation of valsartan, however based on office note on 10/02/2016, he was still taking valasartan even at that time, he was instructed to stop. Furthermore, his carvedilol was switched to diltiazem. He called back yesterday complaining of further fatigue and shortness of breath. He was placed on my schedule as at home to see.  According to the patient, he never had chest pain even prior to his previous cardiac catheterization. However recently he started having worsening shortness of breath and fatigue, he says this is the worst he has ever felt even compared to prior to the cardiac catheterization. He denies any bleeding. TSH earlier this year was normal. We will obtain CBC to rule out anemia, CMP to rule out underlying electrolyte imbalance and arrange outpatient stress test. It is very difficult to say what is causing his symptom, we will rule out angina equivalent.  Past Medical History:  Diagnosis Date  . Adenomatous polyp of colon 2006  . Anemia   . Arthritis    "qwhere" (06/28/2016)  . CAD S/P percutaneous coronary angioplasty 06/2016   06/28/16 PCI with Synergy DES 2.75 mm 20 mm-->m RCA; 07/10/16 PCI with Synergy DES 3.5 mm x 20 mm-->mLAD  . Diabetes mellitus without complication ([place])    "suppose to be on Metformin  but doesn't take it" (06/28/2016)  . Diverticulosis of colon (without mention of hemorrhage) 1999, 2006  . Dyspepsia   . Epiretinal membrane    "epiretinal attachment" (06/28/2016)  . Esophagitis 1999  . GERD (gastroesophageal reflux disease)   . Heart murmur   . Hiatal hernia 1999  . History of kidney stones   . HOH (hard of hearing)    "wears aids"   . Hyperlipidemia   . Hypertension   . 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 Paroxysmal atrial fibrillation ([place])    a. 11/2003 Tikosyn initiated - subsequently d/c'd;  b. 2006 RFCA for Afib @ [facility];  c. 09/2011 Echo: EF 60-65%, Gr 2 DD;  d. 10/2011 DCCV  . Pneumonia 1990s X 1  . Psoriasis 1967  . Rheumatic fever ~ 1958  . Seasonal allergies   . Skin cancer    on leg, left arm  . Sleep apnea    patient has had the sleep test, but states they said it didn't need treatment; "will repeat test at home" (06/28/2016)    Past Surgical History:  Procedure Laterality Date  . APPENDECTOMY  1996  . ATRIAL FIBRILLATION ABLATION N/A 01/13/2014   repeat PVI, also left atrial ablation performed with successfull ablation of LA flutter by Dr Rayann Heman  . ATRIAL FIBRILLATION ABLATION  2006   Dr Rolland Porter at [facility]  . CARDIAC CATHETERIZATION  2005  . CARDIOVERSION N/A 05/22/2013   Procedure: CARDIOVERSION;  Surgeon: [name], MD;  Location: [facility];  Service: Cardiovascular;  Laterality: N/A;  . CARDIOVERSION N/A 10/23/2011   Procedure: CARDIOVERSION;  Surgeon: Deboraha Sprang, MD;  [facility] C[facility] LAB;  Service: Cardiovascular;  Laterality: N/A;  . CARDIOVERSION N/A 05/01/2012   Procedure: CARDIOVERSION;  Surgeon: Deboraha Sprang, MD;  Location: [facility] C[facility] LAB;  Service: Cardiovascular;  Laterality: N/A;  . CARPAL TUNNEL RELEASE Right 1980s?  Marland Kitchen CATARACT EXTRACTION W/ INTRAOCULAR LENS  IMPLANT, BILATERAL Bilateral   . COLONOSCOPY    . CORONARY STENT INTERVENTION N/A 06/28/2016   Procedure: Coronary Stent Intervention;   Surgeon: Leonie Man, MD;  Location: [place] CV LAB;  Service: Cardiovascular;  Laterality: mRCA PCI -Synergy DES 2.75 m x 20 mm (3.1 mm)  . CORONARY STENT INTERVENTION N/A 07/10/2016   Procedure: Coronary Stent Intervention;  Surgeon: Leonie Man, MD;  Location: [place] CV LAB;  Service: Cardiovascular: mLAD PCI Synergy DES 3.5 mm x 20 mm)  . CYSTOSCOPY W/ STONE MANIPULATION  2010   Patient states he has had several kidney stones up until aroune 2010, none since (06/28/2016)  . ESOPHAGOGASTRODUODENOSCOPY    . INTRAVASCULAR PRESSURE WIRE/FFR STUDY N/A 06/28/2016   Procedure: Intravascular Pressure Wire/FFR Study;  Surgeon: Leonie Man, MD;  Location: [place] CV LAB;  Service: Cardiovascular: FFR of mLAD ~65% lesion = 0.75 post --> Staged PCI  . NASAL SINUS SURGERY     x 2  . RIGHT/LEFT HEART CATH AND CORONARY ANGIOGRAPHY N/A 06/28/2016   Procedure: Right/Left Heart Cath and Coronary  Angiography;  Surgeon: Leonie Man, MD;  Location: [place] CV LAB;  Service: Cardiovascular: mRCA 99% (TIMI 2), mLAD ~65% (FFR 0.75), OM3 55%. mild Pulm HTN. Mod LVEDP elevation. EF 55-65%.  . SHOULDER OPEN ROTATOR CUFF REPAIR Bilateral   . TONSILLECTOMY      Current Medications: Outpatient Medications Prior to Visit  Medication Sig Dispense Refill  . acetaminophen (TYLENOL) 500 MG tablet Take 1,000 mg by mouth every 6 (six) hours as needed (for pain.).    Marland Kitchen aspirin 81 MG chewable tablet Chew 1 tablet (81 mg total) by mouth daily. 30 tablet 6  . Biotin 1000 MCG tablet Take 1,000 mcg by mouth at bedtime.    . calcipotriene (DOVONOX) 0.005 % cream Apply 1 application topically 2 (two) times daily as needed (for psoriasis.).    Marland Kitchen Certolizumab Pegol (CIMZIA [place]) Inject 200 mg/kg into the skin every 21 ( twenty-one) days.    . cetirizine (ZYRTEC) 10 MG tablet Take 10 mg by mouth daily.    . clobetasol cream (TEMOVATE) 0.93 % Apply 1 application topically 2 (two) times daily as needed (for  psoriasis).    . clopidogrel (PLAVIX) 75 MG tablet Take 1 tablet (75 mg total) by mouth daily with breakfast. 30 tablet 6  . diltiazem (CARDIZEM CD) 120 MG 24 hr capsule Take 1 capsule (120 mg total) by mouth daily. 90 capsule 3  . Flurandrenolide (CORDRAN) 4 MCG/SQCM TAPE Apply 1 each topically at bedtime. Applied to areas affected by psoriasis.    Javier Docker Oil 500 MG CAPS Take 500 mg by mouth at bedtime.    . metFORMIN (GLUCOPHAGE) 500 MG tablet Take by mouth 2 (two) times daily with a meal.    . nitroGLYCERIN (NITROSTAT) 0.4 MG SL tablet Place 1 tablet (0.4 mg total) under the tongue every 5 (five) minutes as needed for chest pain. 25 tablet 12  . pantoprazole (PROTONIX) 40 MG tablet Take 1 tablet (40 mg total) by mouth daily. 30 tablet 6  . Polyethyl Glycol-Propyl Glycol (SYSTANE) 0.4-0.3 % GEL ophthalmic gel Place 1 application into both eyes at bedtime.    . pravastatin (PRAVACHOL) 40 MG tablet Take 40 mg by mouth at bedtime.    . predniSONE (DELTASONE) 5 MG tablet TAKE ONE TABLET BY MOUTH ONCE DAILY WITH BREAKFAST 60 tablet 1  . Probiotic Product ([place]) CAPS Take 1 capsule by mouth at bedtime.    Marland Kitchen zolpidem (AMBIEN) 10 MG tablet Take 5-10 mg by mouth at bedtime as needed for sleep.    . magnesium oxide (MAG-OX) 400 MG tablet Take 400 mg by mouth daily.    Marland Kitchen Ustekinumab 45 MG/0.5ML SOLN Inject 45 mg into the skin every 3 (three) months. Taking every month Right now for 1 more dose    . valsartan (DIOVAN) 40 MG tablet Take 1 tablet (40 mg total) by mouth daily. (Patient not taking: Reported on 10/17/2016) 90 tablet 3   Facility-Administered Medications Prior to Visit  Medication Dose Route Frequency Provider Last Rate Last Dose  . lidocaine (cardiac) 100 mg/48ml (XYLOCAINE) 20 MG/ML injection 2%    Anesthesia Intra-op Flowers, Rokoshi T, CRNA   60 mg at 03/25/13 1617  . propofol (DIPRIVAN) 10 mg/mL bolus/IV push    Anesthesia Intra-op Flowers, Rokoshi T, CRNA   90 mg at  03/25/13 1617     Allergies:   Hydrocodone-acetaminophen; Other; Oxycodone-acetaminophen; Pseudoephedrine; and Tramadol   Social History   Social History  . Marital status: Married  Spouse name: N/A  . Number of children: 1  . Years of education: N/A   Occupational History  . Retired Armed forces operational officer    Social History Main Topics  . Smoking status: Former Smoker    Years: 10.00    Types: Cigars    Quit date: 09/29/2001  . Smokeless tobacco: Former Systems developer    Types: Snuff, Chew    Quit date: 09/29/2001  . Alcohol use No  . Drug use: No  . Sexual activity: Not Asked   Other Topics Concern  . None   Social History Narrative   Pt lives in [place] with spouse.     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     Family History:  The patient's family history includes Breast cancer in his mother; Colon cancer in his mother; Coronary artery disease in his mother and paternal grandfather; Heart disease in his maternal grandmother; Stroke in his paternal grandfather.   ROS:   Please see the history of present illness.    ROS All other systems reviewed and are negative.   PHYSICAL EXAM:   VS:  BP 136/69   Pulse 86   Ht 6\' 3"  (1.905 m)   Wt 259 lb (117.5 kg)   BMI 32.37 kg/m    GEN: Well nourished, well developed, in no acute distress  HEENT: normal  Neck: no JVD, carotid bruits, or masses Cardiac: RRR; no murmurs, rubs, or gallops,no edema  Respiratory:  clear to auscultation bilaterally, normal work of breathing GI: soft, nontender, nondistended, + BS MS: no deformity or atrophy  Skin: warm and dry, no rash Neuro:  Alert and Oriented x 3, Strength and sensation are intact Psych: euthymic mood, full affect  Wt Readings from Last 3 Encounters:  10/17/16 259 lb (117.5 kg)  10/02/16 264 lb (119.7 kg)  09/17/16 262 lb 6.4 oz (119 kg)      Studies/Labs Reviewed:   EKG:  EKG is ordered today.  The ekg ordered today demonstrates Normal sinus rhythm without  significant ST-T wave changes.  Recent Labs: 06/25/2016: TSH 1.018 10/17/2016: ALT 25; BUN 16; Creatinine, Ser 0.87; Hemoglobin 9.7; Platelets 272; Potassium 4.5; Sodium 139   Lipid Panel    Component Value Date/Time   CHOL (H) 08/17/2009 0444    246        ATP III CLASSIFICATION:  <200     mg/dL   Desirable  200-239  mg/dL   Borderline High  >=240    mg/dL   High          TRIG 866 (H) 08/17/2009 0444   HDL 26 (L) 08/17/2009 0444   CHOLHDL 9.5 08/17/2009 0444   VLDL UNABLE TO CALCULATE IF TRIGLYCERIDE OVER 400 mg/dL 08/17/2009 0444   LDLCALC  08/17/2009 0444    UNABLE TO CALCULATE IF TRIGLYCERIDE OVER 400 mg/dL        Total Cholesterol/HDL:CHD Risk Coronary Heart Disease Risk Table                     Men   Women  1/2 Average Risk   3.4   3.3  Average Risk       5.0   4.4  2 X Average Risk   9.6   7.1  3 X Average Risk  23.4   11.0        Use the calculated Patient Ratio above and the CHD Risk Table to determine the patient's CHD Risk.  ATP III CLASSIFICATION (LDL):  <100     mg/dL   Optimal  100-129  mg/dL   Near or Above                    Optimal  130-159  mg/dL   Borderline  160-189  mg/dL   High  >190     mg/dL   Very High    Additional studies/ records that were reviewed today include:   Echo 11/15/2013 LV EF: 65% -  70%  ------------------------------------------------------------------- Indications:   Atrial fibrillation - 427.31. Shortness of breath 786.05.  ------------------------------------------------------------------- History:  PMH: Acquired from the patient and from the patient&'s chart. Dyspnea. Atrial fibrillation. Congestive heart failure, with an ejection fraction of 65%by echocardiography. The dysfunction is primarily diastolic. Chronic obstructive pulmonary disease. Risk factors: Obese. Dyslipidemia.  ------------------------------------------------------------------- Study Conclusions  - Left ventricle: The cavity  size was normal. There was mild concentric hypertrophy. Systolic function was vigorous. The estimated ejection fraction was in the range of 65% to 70%. Wall motion was normal; there were no regional wall motion abnormalities. The study was not technically sufficient to allow evaluation of LV diastolic dysfunction due to atrial fibrillation. - Aortic valve: Trileaflet; mildly thickened, mildly calcified leaflets. Cusp separation was mildly reduced. Sclerosis without stenosis. There was no regurgitation. - Ascending aorta: The ascending aorta was mildly dilated maesuring 41 mm. - Mitral valve: Calcified annulus. Mildly thickened leaflets . - Left atrium: The atrium was mildly to moderately dilated. - Right ventricle: Systolic function was normal. - Right atrium: The atrium was normal in size. - Tricuspid valve: There was mild regurgitation. - Pulmonic valve: There was no regurgitation. - Pulmonary arteries: PA peak pressure: 35 mm Hg (S). - Systemic veins: Not visualized. - Pericardium, extracardiac: There was no pericardial effusion.  Impressions:  - Normal biventricular size and systolic function. Mild LVH. IVC was not visualized. PASP is at least upper normal 35 mmHg.   Cath 06/28/2016 Conclusion     Mid RCA lesion, 99 %stenosed.  A STENT SYNERGY DES 2.75X20 drug eluting stent was successfully placed.  Post intervention, there is a 0% residual stenosis.  Mid LAD lesion, 65 %stenosed. FFR performed => final FFR 0.75 (physiologically significant). Plan staged PCI  3rd Mrg lesion, 55 %stenosed.  Hemodynamic findings consistent with mild pulmonary hypertension. LV end diastolic pressure is moderately elevated.  The left ventricular systolic function is normal.  The left ventricular ejection fraction is 55-65% by visual estimate.   Mild secondary pulmonary hypertension  Severe 2 Vessel disease - 95% mRCA s/p PCI with DES; ~70% prox LAD with  FFR 0.75  Codominant Cx with distal ~60% lesion prior to bifurcation into distal LPL branches  Plan: Overnight monitoring post cath. He will need close monitoring of the right groin hematoma. Likely discharge tomorrow. We'll start scheduled PCI LAD 1st week of April (~April 3rd)- would plan same day d/c  Continue Plavix. Add Statin for d/c. Will likely need to d/c Flecainide given CAD Dx. - Defer to Afib Clinic; continue Diltiazem. Defer full anticoagulation to Afib Clinic.    Cath 07/10/2016 Conclusion     Mid LAD (just distal to SP1) lesion, 65-70 %stenosed. FFR 0.75  A STENT SYNERGY DES 3.5X20 drug eluting stent was successfully placed.  Post intervention, there is a 0% residual stenosis.  3rd Mrg lesion, 55 %stenosed.  Successful PCI of LAD lesion.  Plan: Return to Short Stay for TR Band Removal & post-cath Hydration.  If stable, plan  is for Same Day Discharge Later on this afternoon.  Continue ASA/Plavix for now  Consider reducing AV Nodal agent b/c concern for fatigue - defer to Roderic Palau, NP from [place]:    1. Shortness of breath   2. Weakness   3. At risk for electrolyte imbalance   4. At risk for impaired function of liver   5. At risk for anemia   6. Coronary artery disease involving native coronary artery of native heart with angina pectoris ([place])   7. Essential hypertension   8. Hyperlipidemia, unspecified hyperlipidemia type   9. Controlled type 2 diabetes mellitus without complication, without long-term current use of insulin ([place])   10. PAF (paroxysmal atrial fibrillation) ([facility])      PLAN:  In order of problems listed above:  1. Fatigue: Along with worsening shortness of breath. This is reminiscent of what he felt prior to the cardiac catheterization., He denies any recent significant palpitation, he has very good cardiac awareness of atrial fibrillation. I don't think we need a event monitor at this time.  Suspicion for recurrent atrial fibrillation relatively low. I will obtain CBC to rule out anemia, CMP to rule out electrolyte imbalance and treadmill Myoview to rule out ischemia. He says he had a history of anemia, however recently stopped his iron therapy. If hemoglobin is low, he will need to restart on his iron therapy.  2. CAD: s/p DES to mid RCA and the mid LAD earlier this year. His main symptom at that time was dyspnea on exertion.  3. Hypertension: Blood pressure stable.   4. Hyperlipidemia: On Pravachol  5. DM 2 on metformin: Managed by primary care provider.  6. PAF status post ablation in 2008 and 2015: If he has any recurrence of atrial fibrillation, he will need to reinitiate systemic anticoagulation therapy. His previous flecainide has been discontinued after he was found to have coronary artery disease.    Medication Adjustments/Labs and Tests Ordered: Current medicines are reviewed at length with the patient today.  Concerns regarding medicines are outlined above.  Medication changes, Labs and Tests ordered today are listed in the Patient Instructions below. Patient Instructions  Medication Instructions:   No changes  Labwork:   CMET, CBC today (non-fasting test)  Testing/Procedures:  Your physician has requested that you have an exercise stress myoview. For further information please visit [email or web address]. Please follow instruction sheet, as given.   Follow-Up:  With Dr. Ellyn Hack as scheduled, following testing  If you need a refill on your cardiac medications before your next appointment, please call your pharmacy.      Hilbert Corrigan, [place]  10/18/2016 12:03 PM    [address], [place], [place]  [address] Phone: [phone]; Fax: [phone]

## 2016-10-17 NOTE — Patient Instructions (Signed)
Medication Instructions:   No changes  Labwork:   CMET, CBC today (non-fasting test)  Testing/Procedures:  Your physician has requested that you have an exercise stress myoview. For further information please visit [email or web address]. Please follow instruction sheet, as given.   Follow-Up:  With Dr. Ellyn Hack as scheduled, following testing  If you need a refill on your cardiac medications before your next appointment, please call your pharmacy.

## 2016-10-17 NOTE — Telephone Encounter (Signed)
New message      Please add medicine : Gabatentin 300mg  to his list of medication (1 capsule at bedtime)

## 2016-10-17 NOTE — Progress Notes (Signed)
3 units drop in hemoglobin which may explain some of his symptom of fatigue and DOE. This is not transfusion level yet. He recently stopped his iron supplement, he needs to restart it. The way he described his DOE in office sound a lot worse than what I would expect from 3 units drop in hemoglobin. I would favor proceed with myoview anyway. Otherwise, kidney function and electrolyte stable

## 2016-10-18 ENCOUNTER — Encounter (HOSPITAL_COMMUNITY): Payer: Medicare Other

## 2016-10-18 ENCOUNTER — Telehealth (HOSPITAL_COMMUNITY): Payer: Self-pay

## 2016-10-18 ENCOUNTER — Encounter: Payer: Self-pay | Admitting: Physician Assistant

## 2016-10-18 NOTE — Telephone Encounter (Signed)
Encounter complete. 

## 2016-10-21 ENCOUNTER — Encounter (HOSPITAL_COMMUNITY)
Admission: RE | Admit: 2016-10-21 | Discharge: 2016-10-21 | Disposition: A | Discharge status: Home / Self Care | Payer: Medicare Other | Source: Ambulatory Visit | Attending: Cardiology | Admitting: Cardiology

## 2016-10-21 DIAGNOSIS — Z9861 Coronary angioplasty status: Secondary | ICD-10-CM | POA: Insufficient documentation

## 2016-10-21 NOTE — Telephone Encounter (Signed)
Pt seen in office --- closing note.

## 2016-10-22 ENCOUNTER — Ambulatory Visit (HOSPITAL_COMMUNITY)
Admission: RE | Admit: 2016-10-22 | Discharge: 2016-10-22 | Disposition: A | Discharge status: Home / Self Care | Payer: Medicare Other | Source: Ambulatory Visit | Attending: Cardiovascular Disease | Admitting: Cardiovascular Disease

## 2016-10-22 DIAGNOSIS — R0602 Shortness of breath: Secondary | ICD-10-CM | POA: Diagnosis not present

## 2016-10-22 DIAGNOSIS — R531 Weakness: Secondary | ICD-10-CM | POA: Diagnosis not present

## 2016-10-22 LAB — MYOCARDIAL PERFUSION IMAGING
CHL CUP NUCLEAR SDS: 2
CHL CUP NUCLEAR SRS: 2
CHL CUP NUCLEAR SSS: 4
CHL CUP RESTING HR STRESS: 71 {beats}/min
LV dias vol: 143 mL (ref 62–150)
LV sys vol: 61 mL
NUC STRESS TID: 1.02
Peak HR: 81 {beats}/min

## 2016-10-22 MED ORDER — TECHNETIUM TC 99M TETROFOSMIN IV KIT
9.9000 | PACK | Freq: Once | INTRAVENOUS | Status: AC | PRN
  Administered 2016-10-22: 9.9 via INTRAVENOUS
  Filled 2016-10-22: qty 10

## 2016-10-22 MED ORDER — TECHNETIUM TC 99M TETROFOSMIN IV KIT
31.0000 | PACK | Freq: Once | INTRAVENOUS | Status: AC | PRN
  Administered 2016-10-22: 31 via INTRAVENOUS
  Filled 2016-10-22: qty 31

## 2016-10-22 MED ORDER — REGADENOSON 0.4 MG/5ML IV SOLN
0.4000 mg | Freq: Once | INTRAVENOUS | Status: AC
  Administered 2016-10-22: 0.4 mg via INTRAVENOUS

## 2016-10-22 NOTE — Progress Notes (Signed)
Cardiac Individual Treatment Plan  Patient Details  Name: Kenneth [name] MRN: [id number] Date of Birth: 12/13/1945 Referring Provider:     CARDIAC REHAB PHASE II ORIENTATION from 08/20/2016 in [place]  Referring Provider  [name] MD, and [name] MD      Initial Encounter Date:    CARDIAC REHAB PHASE II ORIENTATION from 08/20/2016 in [place]  Date  08/20/16  Referring Provider  [name] MD, and [name] MD      Visit Diagnosis: No diagnosis found.  Patient's Home Medications on Admission:  Current Outpatient Prescriptions:  .  acetaminophen (TYLENOL) 500 MG tablet, Take 1,000 mg by mouth every 6 (six) hours as needed (for pain.)., Disp: , Rfl:  .  aspirin 81 MG chewable tablet, Chew 1 tablet (81 mg total) by mouth daily., Disp: 30 tablet, Rfl: 6 .  Biotin 1000 MCG tablet, Take 1,000 mcg by mouth at bedtime., Disp: , Rfl:  .  calcipotriene (DOVONOX) 0.005 % cream, Apply 1 application topically 2 (two) times daily as needed (for psoriasis.)., Disp: , Rfl:  .  Certolizumab Pegol (CIMZIA [place]), Inject 200 mg/kg into the skin every 21 ( twenty-one) days., Disp: , Rfl:  .  cetirizine (ZYRTEC) 10 MG tablet, Take 10 mg by mouth daily., Disp: , Rfl:  .  clobetasol cream (TEMOVATE) 4.09 %, Apply 1 application topically 2 (two) times daily as needed (for psoriasis)., Disp: , Rfl:  .  clopidogrel (PLAVIX) 75 MG tablet, Take 1 tablet (75 mg total) by mouth daily with breakfast., Disp: 30 tablet, Rfl: 6 .  diltiazem (CARDIZEM CD) 120 MG 24 hr capsule, Take 1 capsule (120 mg total) by mouth daily., Disp: 90 capsule, Rfl: 3 .  Flurandrenolide (CORDRAN) 4 MCG/SQCM TAPE, Apply 1 each topically at bedtime. Applied to areas affected by psoriasis., Disp: , Rfl:  .  gabapentin (NEURONTIN) 300 MG capsule, Take 300 mg by mouth at bedtime., Disp: , Rfl:  .  Krill Oil 500 MG CAPS, Take 500 mg by mouth at bedtime., Disp: ,  Rfl:  .  metFORMIN (GLUCOPHAGE) 500 MG tablet, Take by mouth 2 (two) times daily with a meal., Disp: , Rfl:  .  nitroGLYCERIN (NITROSTAT) 0.4 MG SL tablet, Place 1 tablet (0.4 mg total) under the tongue every 5 (five) minutes as needed for chest pain., Disp: 25 tablet, Rfl: 12 .  pantoprazole (PROTONIX) 40 MG tablet, Take 1 tablet (40 mg total) by mouth daily., Disp: 30 tablet, Rfl: 6 .  Polyethyl Glycol-Propyl Glycol (SYSTANE) 0.4-0.3 % GEL ophthalmic gel, Place 1 application into both eyes at bedtime., Disp: , Rfl:  .  pravastatin (PRAVACHOL) 40 MG tablet, Take 40 mg by mouth at bedtime., Disp: , Rfl:  .  predniSONE (DELTASONE) 5 MG tablet, TAKE ONE TABLET BY MOUTH ONCE DAILY WITH BREAKFAST, Disp: 60 tablet, Rfl: 1 .  Probiotic Product ([place]) CAPS, Take 1 capsule by mouth at bedtime., Disp: , Rfl:  .  zolpidem (AMBIEN) 10 MG tablet, Take 5-10 mg by mouth at bedtime as needed for sleep., Disp: , Rfl:  No current facility-administered medications for this encounter.   Facility-Administered Medications Ordered in Other Encounters:  .  lidocaine (cardiac) 100 mg/84ml (XYLOCAINE) 20 MG/ML injection 2%, , , Anesthesia Intra-op, Flowers, Rokoshi T, CRNA, 60 mg at 03/25/13 1617 .  propofol (DIPRIVAN) 10 mg/mL bolus/IV push, , , Anesthesia Intra-op, Flowers, Rokoshi T, CRNA, 90 mg at 03/25/13 1617  Past  Medical History: Past Medical History:  Diagnosis Date  . Adenomatous polyp of colon 2006  . Anemia   . Arthritis    "qwhere" (06/28/2016)  . CAD S/P percutaneous coronary angioplasty 06/2016   06/28/16 PCI with Synergy DES 2.75 mm 20 mm-->m RCA; 07/10/16 PCI with Synergy DES 3.5 mm x 20 mm-->mLAD  . Diabetes mellitus without complication ([place])    "suppose to be on Metformin but doesn't take it" (06/28/2016)  . Diverticulosis of colon (without mention of hemorrhage) 1999, 2006  . Dyspepsia   . Epiretinal membrane    "epiretinal attachment" (06/28/2016)  . Esophagitis 1999  . GERD  (gastroesophageal reflux disease)   . Heart murmur   . Hiatal hernia 1999  . History of kidney stones   . HOH (hard of hearing)    "wears aids"   . Hyperlipidemia   . Hypertension   . 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 Paroxysmal atrial fibrillation ([place])    a. 11/2003 Tikosyn initiated - subsequently d/c'd;  b. 2006 RFCA for Afib @ [facility];  c. 09/2011 Echo: EF 60-65%, Gr 2 DD;  d. 10/2011 DCCV  . Pneumonia 1990s X 1  . Psoriasis 1967  . Rheumatic fever ~ 1958  . Seasonal allergies   . Skin cancer    on leg, left arm  . Sleep apnea    patient has had the sleep test, but states they said it didn't need treatment; "will repeat test at home" (06/28/2016)    Tobacco Use: History  Smoking Status  . Former Smoker  . Years: 10.00  . Types: Cigars  . Quit date: 09/29/2001  Smokeless Tobacco  . Former Systems developer  . Types: Snuff, Chew  . Quit date: 09/29/2001    Labs: Recent Review Flowsheet Data    Labs for ITP Cardiac and Pulmonary Rehab Latest Ref Rng & Units 08/17/2009 02/21/2011 06/28/2016 06/28/2016 06/29/2016   Cholestrol 0 - 200 mg/dL 246 ATP III CLASSIFICATION: <200     mg/dL   Desirable 200-239  mg/dL   Borderline High >=240    mg/dL   High(H) - - - -   LDLCALC 0 - 99 mg/dL UNABLE TO CALCULATE IF TRIGLYCERIDE OVER 400 mg/dL Total Cholesterol/HDL:CHD Risk Coronary Heart Disease Risk Table Men   Women 1/2 Average Risk   3.4   3.3 Average Risk       5.0   4.4 2 X Average Risk   9.6   7.1 3 X Average Risk  23.4   11.0 Use the calculated Patient Ratio above and the CHD Risk Table to determine the patient's CHD Risk. ATP III CLASSIFICATION (LDL): <100     mg/dL   Optimal 100-129  mg/dL   Near or Above Optimal 130-159  mg/dL   Borderline 160-189  mg/dL   High >190     mg/dL   Very High - - - -   HDL >39 mg/dL 26(L) - - - -   Trlycerides <150 mg/dL 866(H) - - - -   Hemoglobin A1c 4.8 - 5.6 % - 6.5 - - 7.5(H)   PHART 7.350 - 7.450 - - 7.426  - -   PCO2ART 32.0 - 48.0 mmHg - - 33.0 - -   HCO3 20.0 - 28.0 mmol/L - - 21.7 23.6 -   TCO2 0 - 100 mmol/L 27 - 23 25 -   ACIDBASEDEF 0.0 - 2.0 mmol/L - - 2.0 1.0 -  O2SAT % - - 99.0 73.0 -      Capillary Blood Glucose: Lab Results  Component Value Date   GLUCAP 126 (H) 09/23/2016   GLUCAP 144 (H) 09/09/2016   GLUCAP 129 (H) 09/06/2016   GLUCAP 121 (H) 08/30/2016   GLUCAP 142 (H) 08/30/2016     Exercise Target Goals:    Exercise Program Goal: Individual exercise prescription set with THRR, safety & activity barriers. Participant demonstrates ability to understand and report RPE using BORG scale, to self-measure pulse accurately, and to acknowledge the importance of the exercise prescription.  Exercise Prescription Goal: Starting with aerobic activity 30 plus minutes a day, 3 days per week for initial exercise prescription. Provide home exercise prescription and guidelines that participant acknowledges understanding prior to discharge.  Activity Barriers & Risk Stratification:     Activity Barriers & Cardiac Risk Stratification - 08/20/16 0820      Activity Barriers & Cardiac Risk Stratification   Activity Barriers Deconditioning;Shortness of Breath;Muscular Weakness   Cardiac Risk Stratification High      6 Minute Walk:     6 Minute Walk    Row Name 08/20/16 1034         6 Minute Walk   Phase Initial     Distance 1371 feet     Walk Time 6 minutes     # of Rest Breaks 0     MPH 2.59     METS 3.02     RPE 11     VO2 Peak 10.56     Symptoms No     Resting HR 70 bpm     Resting BP 142/80     Max Ex. HR 103 bpm     Max Ex. BP 176/76     2 Minute Post BP 130/80        Oxygen Initial Assessment:   Oxygen Re-Evaluation:   Oxygen Discharge (Final Oxygen Re-Evaluation):   Initial Exercise Prescription:     Initial Exercise Prescription - 08/20/16 1100      Date of Initial Exercise RX and Referring Provider   Date 08/20/16   Referring Provider  [name] MD, and [name] MD     Treadmill   MPH 2.3   Grade 1   Minutes 10   METs 3.08     Bike   Level 0.8   Minutes 10   METs 1.79     NuStep   Level 3   SPM 70   Minutes 10   METs 2     Prescription Details   Frequency (times per week) 3   Duration Progress to 30 minutes of continuous aerobic without signs/symptoms of physical distress     Intensity   THRR 40-80% of Max Heartrate 60-119   Ratings of Perceived Exertion 11-13   Perceived Dyspnea 0-4     Progression   Progression Continue to progress workloads to maintain intensity without signs/symptoms of physical distress.     Resistance Training   Training Prescription Yes   Weight 3lbs   Reps 10-15      Perform Capillary Blood Glucose checks as needed.  Exercise Prescription Changes:     Exercise Prescription Changes    Row Name 09/25/16 1400             Response to Exercise   Blood Pressure (Admit) 144/68       Blood Pressure (Exercise) 134/64       Blood Pressure (Exit) 138/72  Heart Rate (Admit) 82 bpm       Heart Rate (Exercise) 118 bpm       Heart Rate (Exit) 92 bpm       Rating of Perceived Exertion (Exercise) 12       Duration Progress to 45 minutes of aerobic exercise without signs/symptoms of physical distress       Intensity THRR unchanged         Progression   Progression Continue to progress workloads to maintain intensity without signs/symptoms of physical distress.       Average METs 2.6         Resistance Training   Training Prescription Yes       Weight 3lbs       Reps 10-15         Bike   Level 1       Minutes 10       METs 2.58         NuStep   Level 4       SPM 70       Minutes 10       METs 2.2         Track   Laps 9       Minutes 10       METs 2.57         Home Exercise Plan   Plans to continue exercise at Home (comment)       Frequency Add 3 additional days to program exercise sessions.          Exercise Comments:     Exercise  Comments    Row Name 09/25/16 1423 10/21/16 1201         Exercise Comments Pt is doing well with exercise.  Pt has been absent from CR for several weeks         Exercise Goals and Review:     Exercise Goals    Row Name 08/20/16 0820             Exercise Goals   Increase Physical Activity Yes       Intervention Provide advice, education, support and counseling about physical activity/exercise needs.;Develop an individualized exercise prescription for aerobic and resistive training based on initial evaluation findings, risk stratification, comorbidities and participant's personal goals.       Expected Outcomes Achievement of increased cardiorespiratory fitness and enhanced flexibility, muscular endurance and strength shown through measurements of functional capacity and personal statement of participant.       Increase Strength and Stamina Yes       Intervention Provide advice, education, support and counseling about physical activity/exercise needs.;Develop an individualized exercise prescription for aerobic and resistive training based on initial evaluation findings, risk stratification, comorbidities and participant's personal goals.       Expected Outcomes Achievement of increased cardiorespiratory fitness and enhanced flexibility, muscular endurance and strength shown through measurements of functional capacity and personal statement of participant.          Exercise Goals Re-Evaluation :     Exercise Goals Re-Evaluation    Row Name 09/25/16 1421             Exercise Goal Re-Evaluation   Exercise Goals Review Increase Physical Activity;Increase Strenth and Stamina       Comments pt is doing okay with exercise, his attendance is sporadic and he c/o not feeling well during his last visit so he wasn't able to exercise  Expected Outcomes Encouraged pt to get some rest and come back to CR when he is feeling better.  continue with exercise Rx and increase workloads as  tolerated           Discharge Exercise Prescription (Final Exercise Prescription Changes):     Exercise Prescription Changes - 09/25/16 1400      Response to Exercise   Blood Pressure (Admit) 144/68   Blood Pressure (Exercise) 134/64   Blood Pressure (Exit) 138/72   Heart Rate (Admit) 82 bpm   Heart Rate (Exercise) 118 bpm   Heart Rate (Exit) 92 bpm   Rating of Perceived Exertion (Exercise) 12   Duration Progress to 45 minutes of aerobic exercise without signs/symptoms of physical distress   Intensity THRR unchanged     Progression   Progression Continue to progress workloads to maintain intensity without signs/symptoms of physical distress.   Average METs 2.6     Resistance Training   Training Prescription Yes   Weight 3lbs   Reps 10-15     Bike   Level 1   Minutes 10   METs 2.58     NuStep   Level 4   SPM 70   Minutes 10   METs 2.2     Track   Laps 9   Minutes 10   METs 2.57     Home Exercise Plan   Plans to continue exercise at Home (comment)   Frequency Add 3 additional days to program exercise sessions.      Nutrition:  Target Goals: Understanding of nutrition guidelines, daily intake of sodium 1500mg , cholesterol 200mg , calories 30% from fat and 7% or less from saturated fats, daily to have 5 or more servings of fruits and vegetables.  Biometrics:     Pre Biometrics - 08/20/16 1034      Pre Biometrics   Height 6' 1.75" (1.873 m)   Weight 260 lb 2.3 oz (118 kg)   Waist Circumference 46 inches   Hip Circumference 44 inches   Waist to Hip Ratio 1.05 %   BMI (Calculated) 33.7   Triceps Skinfold 25 mm   % Body Fat 39.1 %   Grip Strength 47 kg   Flexibility 13 in   Single Leg Stand 8.24 seconds       Nutrition Therapy Plan and Nutrition Goals:   Nutrition Discharge: Nutrition Scores:   Nutrition Goals Re-Evaluation:   Nutrition Goals Re-Evaluation:   Nutrition Goals Discharge (Final Nutrition Goals  Re-Evaluation):   Psychosocial: Target Goals: Acknowledge presence or absence of significant depression and/or stress, maximize coping skills, provide positive support system. Participant is able to verbalize types and ability to use techniques and skills needed for reducing stress and depression.  Initial Review & Psychosocial Screening:     Initial Psych Review & Screening - 08/26/16 1628      Family Dynamics   Good Support System? Yes     Barriers   Psychosocial barriers to participate in program There are no identifiable barriers or psychosocial needs.     Screening Interventions   Interventions Encouraged to exercise      Quality of Life Scores:   PHQ-9: Recent Review Flowsheet Data    Depression screen [facility] 2/9 08/26/2016 07/18/2014   Decreased Interest 0 0   Down, Depressed, Hopeless 0 0   PHQ - 2 Score 0 0     Interpretation of Total Score  Total Score Depression Severity:  1-4 = Minimal depression, 5-9 = Mild depression, 10-14 =  Moderate depression, 15-19 = Moderately severe depression, 20-27 = Severe depression   Psychosocial Evaluation and Intervention:   Psychosocial Re-Evaluation:     Psychosocial Re-Evaluation    [place] Name 09/25/16 1816             Psychosocial Re-Evaluation   Current issues with None Identified       Interventions Encouraged to attend Cardiac Rehabilitation for the exercise       Continue Psychosocial Services  No Follow up required          Psychosocial Discharge (Final Psychosocial Re-Evaluation):     Psychosocial Re-Evaluation - 09/25/16 1816      Psychosocial Re-Evaluation   Current issues with None Identified   Interventions Encouraged to attend Cardiac Rehabilitation for the exercise   Continue Psychosocial Services  No Follow up required      Vocational Rehabilitation: Provide vocational rehab assistance to qualifying candidates.   Vocational Rehab Evaluation & Intervention:     Vocational Rehab - 08/26/16  1629      Initial Vocational Rehab Evaluation & Intervention   Assessment shows need for Vocational Rehabilitation No  Kinte is retired and does not need voational rehab at this time      Education: Education Goals: Education classes will be provided on a weekly basis, covering required topics. Participant will state understanding/return demonstration of topics presented.  Learning Barriers/Preferences:     Learning Barriers/Preferences - 08/20/16 0819      Learning Barriers/Preferences   Learning Barriers Hearing;Sight  cataract surgery   Learning Preferences Written Material      Education Topics: Count Your Pulse:  -Group instruction provided by verbal instruction, demonstration, patient participation and written materials to support subject.  Instructors address importance of being able to find your pulse and how to count your pulse when at home without a heart monitor.  Patients get hands on experience counting their pulse with staff help and individually.   CARDIAC REHAB PHASE II EXERCISE from 09/06/2016 in [place]  Date  09/06/16  Instruction Review Code  2- meets goals/outcomes      Heart Attack, Angina, and Risk Factor Modification:  -Group instruction provided by verbal instruction, video, and written materials to support subject.  Instructors address signs and symptoms of angina and heart attacks.    Also discuss risk factors for heart disease and how to make changes to improve heart health risk factors.   Functional Fitness:  -Group instruction provided by verbal instruction, demonstration, patient participation, and written materials to support subject.  Instructors address safety measures for doing things around the house.  Discuss how to get up and down off the floor, how to pick things up properly, how to safely get out of a chair without assistance, and balance training.   Meditation and Mindfulness:  -Group instruction  provided by verbal instruction, patient participation, and written materials to support subject.  Instructor addresses importance of mindfulness and meditation practice to help reduce stress and improve awareness.  Instructor also leads participants through a meditation exercise.    Stretching for Flexibility and Mobility:  -Group instruction provided by verbal instruction, patient participation, and written materials to support subject.  Instructors lead participants through series of stretches that are designed to increase flexibility thus improving mobility.  These stretches are additional exercise for major muscle groups that are typically performed during regular warm up and cool down.   CARDIAC REHAB PHASE II EXERCISE from 09/06/2016 in [place]  REHAB  Date  08/30/16  Educator  Dorna Bloom, [place] ACSM RCEP  Instruction Review Code  1- partially meets, needs review/practice      Hands Only CPR:  -Group verbal, video, and participation provides a basic overview of AHA guidelines for community CPR. Role-play of emergencies allow participants the opportunity to practice calling for help and chest compression technique with discussion of AED use.   Hypertension: -Group verbal and written instruction that provides a basic overview of hypertension including the most recent diagnostic guidelines, risk factor reduction with self-care instructions and medication management.    Nutrition I class: Heart Healthy Eating:  -Group instruction provided by PowerPoint slides, verbal discussion, and written materials to support subject matter. The instructor gives an explanation and review of the Therapeutic Lifestyle Changes diet recommendations, which includes a discussion on lipid goals, dietary fat, sodium, fiber, plant stanol/sterol esters, sugar, and the components of a well-balanced, healthy diet.   Nutrition II class: Lifestyle Skills:  -Group instruction provided by  PowerPoint slides, verbal discussion, and written materials to support subject matter. The instructor gives an explanation and review of label reading, grocery shopping for heart health, heart healthy recipe modifications, and ways to make healthier choices when eating out.   Diabetes Question & Answer:  -Group instruction provided by PowerPoint slides, verbal discussion, and written materials to support subject matter. The instructor gives an explanation and review of diabetes co-morbidities, pre- and post-prandial blood glucose goals, pre-exercise blood glucose goals, signs, symptoms, and treatment of hypoglycemia and hyperglycemia, and foot care basics.   Diabetes Blitz:  -Group instruction provided by PowerPoint slides, verbal discussion, and written materials to support subject matter. The instructor gives an explanation and review of the physiology behind type 1 and type 2 diabetes, diabetes medications and rational behind using different medications, pre- and post-prandial blood glucose recommendations and Hemoglobin A1c goals, diabetes diet, and exercise including blood glucose guidelines for exercising safely.    Portion Distortion:  -Group instruction provided by PowerPoint slides, verbal discussion, written materials, and food models to support subject matter. The instructor gives an explanation of serving size versus portion size, changes in portions sizes over the last 20 years, and what consists of a serving from each food group.   Stress Management:  -Group instruction provided by verbal instruction, video, and written materials to support subject matter.  Instructors review role of stress in heart disease and how to cope with stress positively.     Exercising on Your Own:  -Group instruction provided by verbal instruction, power point, and written materials to support subject.  Instructors discuss benefits of exercise, components of exercise, frequency and intensity of exercise,  and end points for exercise.  Also discuss use of nitroglycerin and activating EMS.  Review options of places to exercise outside of rehab.  Review guidelines for sex with heart disease.   Cardiac Drugs I:  -Group instruction provided by verbal instruction and written materials to support subject.  Instructor reviews cardiac drug classes: antiplatelets, anticoagulants, beta blockers, and statins.  Instructor discusses reasons, side effects, and lifestyle considerations for each drug class.   Cardiac Drugs II:  -Group instruction provided by verbal instruction and written materials to support subject.  Instructor reviews cardiac drug classes: angiotensin converting enzyme inhibitors (ACE-I), angiotensin II receptor blockers (ARBs), nitrates, and calcium channel blockers.  Instructor discusses reasons, side effects, and lifestyle considerations for each drug class.   Anatomy and Physiology of the Circulatory System:  Group verbal and written instruction  and models provide basic cardiac anatomy and physiology, with the coronary electrical and arterial systems. Review of: AMI, Angina, Valve disease, Heart Failure, Peripheral Artery Disease, Cardiac Arrhythmia, Pacemakers, and the ICD.   Other Education:  -Group or individual verbal, written, or video instructions that support the educational goals of the cardiac rehab program.   Knowledge Questionnaire Score:   Core Components/Risk Factors/Patient Goals at Admission:     Personal Goals and Risk Factors at Admission - 08/20/16 1114      Core Components/Risk Factors/Patient Goals on Admission    Weight Management Yes;Obesity;Weight Loss;Weight Maintenance   Intervention Weight Management: Develop a combined nutrition and exercise program designed to reach desired caloric intake, while maintaining appropriate intake of nutrient and fiber, sodium and fats, and appropriate energy expenditure required for the weight goal.;Weight Management:  Provide education and appropriate resources to help participant work on and attain dietary goals.;Weight Management/Obesity: Establish reasonable short term and long term weight goals.;Obesity: Provide education and appropriate resources to help participant work on and attain dietary goals.   Expected Outcomes Short Term: Continue to assess and modify interventions until short term weight is achieved;Long Term: Adherence to nutrition and physical activity/exercise program aimed toward attainment of established weight goal;Weight Maintenance: Understanding of the daily nutrition guidelines, which includes 25-35% calories from fat, 7% or less cal from saturated fats, less than 200mg  cholesterol, less than 1.5gm of sodium, & 5 or more servings of fruits and vegetables daily;Weight Loss: Understanding of general recommendations for a balanced deficit meal plan, which promotes 1-2 lb weight loss per week and includes a negative energy balance of [phone] kcal/d;Understanding recommendations for meals to include 15-35% energy as protein, 25-35% energy from fat, 35-60% energy from carbohydrates, less than 200mg  of dietary cholesterol, 20-35 gm of total fiber daily;Understanding of distribution of calorie intake throughout the day with the consumption of 4-5 meals/snacks   Diabetes Yes   Intervention Provide education about signs/symptoms and action to take for hypo/hyperglycemia.;Provide education about proper nutrition, including hydration, and aerobic/resistive exercise prescription along with prescribed medications to achieve blood glucose in normal ranges: Fasting glucose 65-99 mg/dL   Expected Outcomes Short Term: Participant verbalizes understanding of the signs/symptoms and immediate care of hyper/hypoglycemia, proper foot care and importance of medication, aerobic/resistive exercise and nutrition plan for blood glucose control.;Long Term: Attainment of HbA1C < 7%.   Hypertension Yes   Intervention Provide  education on lifestyle modifcations including regular physical activity/exercise, weight management, moderate sodium restriction and increased consumption of fresh fruit, vegetables, and low fat dairy, alcohol moderation, and smoking cessation.;Monitor prescription use compliance.   Expected Outcomes Short Term: Continued assessment and intervention until BP is < 140/45mm HG in hypertensive participants. < 130/25mm HG in hypertensive participants with diabetes, heart failure or chronic kidney disease.;Long Term: Maintenance of blood pressure at goal levels.   Lipids Yes   Intervention Provide education and support for participant on nutrition & aerobic/resistive exercise along with prescribed medications to achieve LDL 70mg , HDL >40mg .   Expected Outcomes Short Term: Participant states understanding of desired cholesterol values and is compliant with medications prescribed. Participant is following exercise prescription and nutrition guidelines.;Long Term: Cholesterol controlled with medications as prescribed, with individualized exercise RX and with personalized nutrition plan. Value goals: LDL < 70mg , HDL > 40 mg.      Core Components/Risk Factors/Patient Goals Review:    Core Components/Risk Factors/Patient Goals at Discharge (Final Review):    ITP Comments:     ITP Comments  [place] Name 08/20/16 0802           ITP Comments Dr. Fransico Him, Medical Director           Comments: [facility]'s cardiac rehab has been on hold pending further cardiac workup and clearance to return to exercise from Dr Ellyn Hack.[name], RN,BSN 10/22/2016 4:03 PM

## 2016-10-22 NOTE — Progress Notes (Signed)
Normal stress test, normal pumping function. Will focus on treating anemia for now. Continue iron supplement for 6 weeks before recheck CBC. I tried to contact the patient myself, but got answer machine.

## 2016-10-23 ENCOUNTER — Encounter (HOSPITAL_COMMUNITY)
Admission: RE | Admit: 2016-10-23 | Discharge: 2016-10-23 | Disposition: A | Discharge status: Home / Self Care | Payer: Medicare Other | Source: Ambulatory Visit | Attending: Cardiology | Admitting: Cardiology

## 2016-10-25 ENCOUNTER — Telehealth: Payer: Self-pay | Admitting: Physician Assistant

## 2016-10-25 ENCOUNTER — Encounter (HOSPITAL_COMMUNITY): Payer: Medicare Other

## 2016-10-25 NOTE — Telephone Encounter (Signed)
Returned call to patient's wife myoview results given.Advised to continue iron supplement.Repeat cbc in 6 weeks as planned.

## 2016-10-25 NOTE — Telephone Encounter (Signed)
New message ° ° ° °Pt is returning call about results. °

## 2016-10-28 ENCOUNTER — Encounter (HOSPITAL_COMMUNITY): Payer: Medicare Other

## 2016-10-30 ENCOUNTER — Encounter (HOSPITAL_COMMUNITY): Payer: Medicare Other

## 2016-11-01 ENCOUNTER — Encounter (HOSPITAL_COMMUNITY): Payer: Medicare Other

## 2016-11-04 ENCOUNTER — Encounter (HOSPITAL_COMMUNITY): Admission: RE | Admit: 2016-11-04 | Payer: Medicare Other | Source: Ambulatory Visit

## 2016-11-06 ENCOUNTER — Encounter (HOSPITAL_COMMUNITY): Payer: Medicare Other

## 2016-11-08 ENCOUNTER — Encounter (HOSPITAL_COMMUNITY): Payer: Medicare Other

## 2016-11-11 ENCOUNTER — Encounter (HOSPITAL_COMMUNITY): Payer: Medicare Other

## 2016-11-11 ENCOUNTER — Ambulatory Visit (INDEPENDENT_AMBULATORY_CARE_PROVIDER_SITE_OTHER): Payer: Medicare Other | Admitting: Cardiology

## 2016-11-11 ENCOUNTER — Encounter: Payer: Self-pay | Admitting: Cardiology

## 2016-11-11 VITALS — BP 128/66 | HR 90 | Ht 75.0 in | Wt 259.0 lb

## 2016-11-11 DIAGNOSIS — E1169 Type 2 diabetes mellitus with other specified complication: Secondary | ICD-10-CM | POA: Insufficient documentation

## 2016-11-11 DIAGNOSIS — I2510 Atherosclerotic heart disease of native coronary artery without angina pectoris: Secondary | ICD-10-CM | POA: Diagnosis not present

## 2016-11-11 DIAGNOSIS — D509 Iron deficiency anemia, unspecified: Secondary | ICD-10-CM | POA: Insufficient documentation

## 2016-11-11 DIAGNOSIS — I5032 Chronic diastolic (congestive) heart failure: Secondary | ICD-10-CM

## 2016-11-11 DIAGNOSIS — D500 Iron deficiency anemia secondary to blood loss (chronic): Secondary | ICD-10-CM

## 2016-11-11 DIAGNOSIS — I25119 Atherosclerotic heart disease of native coronary artery with unspecified angina pectoris: Secondary | ICD-10-CM | POA: Diagnosis not present

## 2016-11-11 DIAGNOSIS — R0609 Other forms of dyspnea: Secondary | ICD-10-CM

## 2016-11-11 DIAGNOSIS — Z7901 Long term (current) use of anticoagulants: Secondary | ICD-10-CM

## 2016-11-11 DIAGNOSIS — I209 Angina pectoris, unspecified: Secondary | ICD-10-CM

## 2016-11-11 DIAGNOSIS — Z9861 Coronary angioplasty status: Secondary | ICD-10-CM

## 2016-11-11 DIAGNOSIS — J849 Interstitial pulmonary disease, unspecified: Secondary | ICD-10-CM

## 2016-11-11 DIAGNOSIS — E785 Hyperlipidemia, unspecified: Secondary | ICD-10-CM | POA: Diagnosis not present

## 2016-11-11 DIAGNOSIS — I484 Atypical atrial flutter: Secondary | ICD-10-CM

## 2016-11-11 NOTE — Progress Notes (Signed)
PCP: [facility] Note: Chief Complaint  Patient presents with  . Follow-up    myoview follow up    HPI: Kenneth [name] is a 71 y.o. male with a PMH below who presents today for 1 month follow-up to review the results of a Myoview stress test. He has recently diagnosed and treated 2 vessel disease with two-vessel PCI, but has never really felt better after PCI. . I last saw him on June 27 and he was still noticing fatigue and lack of energy as well as some positional dizziness. - I stopped his carvedilol and tried to start low-dose diltiazem. Also stopped losartan.  [name] was last seen on 10/17/2016 by Almyra Deforest, PA - he started noticing more worsening shortness of breath and fatigue similar to his pre-cardiac catheterization results. He was noted to be mildly anemic started on iron supplementation. He is also been seen by his PCP who is concerned about possible adrenal insufficiency with him being on long-term steroids.  Recent Hospitalizations: None  Studies Personally Reviewed - (if available, images/films reviewed: From Epic Chart or Care Everywhere)  Myoview Stress Test 10/22/2016: [place] study.  LOW RISK. EF 55-65%. No ischemia or infarction  Interval History: [name] returns today still not having any symptoms of chest tightness pressure with rest or exertion, but he just simply continues to note fatigue and exertional dyspnea. He really notes that if he is like walking upstairs. For instance, he was in [place] at the outer amphitheater, and was extremely short of breath, barely making it up out of the theater and had to take a golf cart the rest the leg the car after he got the top. He however he says that is long as he paces himself and is not trying to overdo it he does okay. He put together entire greenhouse for his wife and did lots of other activities without too much trouble. He says that he is getting better. His energy level is improving. He is  happy to see the results of stress test were normal. He denies any rapid irregular heartbeats or palpitations to suggest recurrence of his atrial flutter.  No PND, orthopnea or edema. No lightheadedness, dizziness or syncope/near syncope, TIAs/amaurosis fugax.  No melena, hematochezia, hematuria, or epstaxis. -His wife has noted diffuse spots of blood on his underwear in the front, but is not limited a lot. He denies any hematuria. No dysuria No claudication.  ROS: A comprehensive was performed. Review of Systems  Constitutional: Positive for malaise/fatigue (His energy level is getting better). Negative for fever.  HENT: Negative for congestion, nosebleeds and sinus pain.   Eyes: Negative for blurred vision and double vision.  Respiratory: Positive for shortness of breath (Per history of present illness).   Cardiovascular: Positive for leg swelling (Trivial).  Gastrointestinal: Negative for abdominal pain, blood in stool, diarrhea, heartburn and melena.  Genitourinary: Negative for dysuria, frequency, hematuria and urgency.  Musculoskeletal: Positive for joint pain (From psoriatic arthritis) and myalgias.  Skin: Positive for rash (Psoriasis).  Neurological: Negative for loss of consciousness.  Endo/Heme/Allergies: Does not bruise/bleed easily.  Psychiatric/Behavioral: Negative for depression and memory loss. The patient does not have insomnia.    I have reviewed and (if needed) personally updated the patient's problem list, medications, allergies, past medical and surgical history, social and family history.   Past Medical History:  Diagnosis Date  . Adenomatous polyp of colon 2006  . Anemia   . Arthritis    "qwhere" (  06/28/2016)  . CAD S/P percutaneous coronary angioplasty 06/2016   06/28/16 PCI with Synergy DES 2.75 mm 20 mm-->m RCA; 07/10/16 PCI with Synergy DES 3.5 mm x 20 mm-->mLAD  . Diabetes mellitus without complication ([place])    "suppose to be on Metformin but doesn't take it"  (06/28/2016)  . Diverticulosis of colon (without mention of hemorrhage) 1999, 2006  . Dyspepsia   . Epiretinal membrane    "epiretinal attachment" (06/28/2016)  . Esophagitis 1999  . GERD (gastroesophageal reflux disease)   . Heart murmur   . Hiatal hernia 1999  . History of kidney stones   . HOH (hard of hearing)    "wears aids"   . Hyperlipidemia   . Hypertension   . 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 Paroxysmal atrial fibrillation ([place])    a. 11/2003 Tikosyn initiated - subsequently d/c'd;  b. 2006 RFCA for Afib @ [facility];  c. 09/2011 Echo: EF 60-65%, Gr 2 DD;  d. 10/2011 DCCV  . Pneumonia 1990s X 1  . Psoriasis 1967  . Rheumatic fever ~ 1958  . Seasonal allergies   . Skin cancer    on leg, left arm  . Sleep apnea    patient has had the sleep test, but states they said it didn't need treatment; "will repeat test at home" (06/28/2016)    Past Surgical History:  Procedure Laterality Date  . APPENDECTOMY  1996  . ATRIAL FIBRILLATION ABLATION N/A 01/13/2014   repeat PVI, also left atrial ablation performed with successfull ablation of LA flutter by Dr Rayann Heman  . ATRIAL FIBRILLATION ABLATION  2006   Dr Rolland Porter at [facility]  . CARDIAC CATHETERIZATION  2005  . CARDIOVERSION N/A 05/22/2013   Procedure: CARDIOVERSION;  Surgeon: [name], MD;  Location: [place];  Service: Cardiovascular;  Laterality: N/A;  . CARDIOVERSION N/A 10/23/2011   Procedure: CARDIOVERSION;  Surgeon: Deboraha Sprang, MD;  Location: [facility] CATH LAB;  Service: Cardiovascular;  Laterality: N/A;  . CARDIOVERSION N/A 05/01/2012   Procedure: CARDIOVERSION;  Surgeon: Deboraha Sprang, MD;  Location: [facility] CATH LAB;  Service: Cardiovascular;  Laterality: N/A;  . CARPAL TUNNEL RELEASE Right 1980s?  Marland Kitchen CATARACT EXTRACTION W/ INTRAOCULAR LENS  IMPLANT, BILATERAL Bilateral   . COLONOSCOPY    . CORONARY STENT INTERVENTION N/A 06/28/2016   Procedure: Coronary Stent Intervention;  Surgeon: Leonie Man,  MD;  Location: [place] CV LAB;  Service: Cardiovascular;  Laterality: mRCA PCI -Synergy DES 2.75 m x 20 mm (3.1 mm)  . CORONARY STENT INTERVENTION N/A 07/10/2016   Procedure: Coronary Stent Intervention;  Surgeon: Leonie Man, MD;  Location: [place] CV LAB;  Service: Cardiovascular: mLAD PCI Synergy DES 3.5 mm x 20 mm)  . CYSTOSCOPY W/ STONE MANIPULATION  2010   Patient states he has had several kidney stones up until aroune 2010, none since (06/28/2016)  . ESOPHAGOGASTRODUODENOSCOPY    . INTRAVASCULAR PRESSURE WIRE/FFR STUDY N/A 06/28/2016   Procedure: Intravascular Pressure Wire/FFR Study;  Surgeon: Leonie Man, MD;  Location: [place] CV LAB;  Service: Cardiovascular: FFR of mLAD ~65% lesion = 0.75 post --> Staged PCI  . NASAL SINUS SURGERY     x 2  . RIGHT/LEFT HEART CATH AND CORONARY ANGIOGRAPHY N/A 06/28/2016   Procedure: Right/Left Heart Cath and Coronary Angiography;  Surgeon: Leonie Man, MD;  Location: [place] CV LAB;  Service: Cardiovascular: mRCA 99% (TIMI 2), mLAD ~65% (FFR 0.75),  OM3 55%. mild Pulm HTN. Mod LVEDP elevation. EF 55-65%.  . SHOULDER OPEN ROTATOR CUFF REPAIR Bilateral   . TONSILLECTOMY      Diagnostic Diagram 3/23/2018Post-Intervention Diagram 07/10/2016 PCI mRCA Synergy 2.75 x 20PCI mLAD Synergy DES 3.5 x 20   Current Meds  Medication Sig  . acetaminophen (TYLENOL) 500 MG tablet Take 1,000 mg by mouth every 6 (six) hours as needed (for pain.).  Marland Kitchen aspirin 81 MG chewable tablet Chew 1 tablet (81 mg total) by mouth daily.  . Biotin 1000 MCG tablet Take 1,000 mcg by mouth at bedtime.  . calcipotriene (DOVONOX) 0.005 % cream Apply 1 application topically 2 (two) times daily as needed (for psoriasis.).  Marland Kitchen Certolizumab Pegol (CIMZIA [place]) Inject 200 mg/kg into the skin every 14 (fourteen) days.   . cetirizine (ZYRTEC) 10 MG tablet Take 10 mg by mouth  daily.  . clobetasol cream (TEMOVATE) 6.30 % Apply 1 application topically 2 (two) times daily as needed (for psoriasis).  . clopidogrel (PLAVIX) 75 MG tablet Take 1 tablet (75 mg total) by mouth daily with breakfast.  . diltiazem (CARDIZEM CD) 120 MG 24 hr capsule Take 1 capsule (120 mg total) by mouth daily.  Marland Kitchen Flurandrenolide (CORDRAN) 4 MCG/SQCM TAPE Apply 1 each topically at bedtime. Applied to areas affected by psoriasis.  Marland Kitchen gabapentin (NEURONTIN) 300 MG capsule Take 300 mg by mouth at bedtime.  Javier Docker Oil 500 MG CAPS Take 500 mg by mouth at bedtime.  . metFORMIN (GLUCOPHAGE) 500 MG tablet Take by mouth 2 (two) times daily with a meal.  . nitroGLYCERIN (NITROSTAT) 0.4 MG SL tablet Place 1 tablet (0.4 mg total) under the tongue every 5 (five) minutes as needed for chest pain.  . pantoprazole (PROTONIX) 40 MG tablet Take 1 tablet (40 mg total) by mouth daily.  Vladimir Faster Glycol-Propyl Glycol (SYSTANE) 0.4-0.3 % GEL ophthalmic gel Place 1 application into both eyes at bedtime.  . pravastatin (PRAVACHOL) 40 MG tablet Take 40 mg by mouth at bedtime.  . predniSONE (DELTASONE) 5 MG tablet TAKE ONE TABLET BY MOUTH ONCE DAILY WITH BREAKFAST  . Probiotic Product ([employer]) CAPS Take 1 capsule by mouth at bedtime.  Marland Kitchen zolpidem (AMBIEN) 10 MG tablet Take 5-10 mg by mouth at bedtime as needed for sleep.    Allergies  Allergen Reactions  . Hydrocodone-Acetaminophen Shortness Of Breath    In combination with decongestants   . Other Palpitations and Other (See Comments)    ALL DECONGESTANTS - CAUSE PALPITATIONS; THROWS HEART RHYTHM OUT OF BALANCE  . Oxycodone-Acetaminophen Shortness Of Breath  . Pseudoephedrine Palpitations  . Tramadol Shortness Of Breath and Rash    flushing    Social History   Social History  . Marital status: Married    Spouse name: N/A  . Number of children: 1  . Years of education: N/A   Occupational History  . Retired Armed forces operational officer    Social History  Main Topics  . Smoking status: Former Smoker    Years: 10.00    Types: Cigars    Quit date: 09/29/2001  . Smokeless tobacco: Former Systems developer    Types: Snuff, Chew    Quit date: 09/29/2001  . Alcohol use No  . Drug use: No  . Sexual activity: Not Asked   Other Topics Concern  . None   Social History Narrative   Pt lives in [place] with spouse.     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  family history includes Breast cancer in his mother; Colon cancer in his mother; Coronary artery disease in his mother and paternal grandfather; Heart disease in his maternal grandmother; Stroke in his paternal grandfather.  Wt Readings from Last 3 Encounters:  11/11/16 259 lb (117.5 kg)  10/22/16 259 lb (117.5 kg)  10/17/16 259 lb (117.5 kg)    PHYSICAL EXAM BP 128/66   Pulse 90   Ht 6\' 3"  (1.905 m)   Wt 259 lb (117.5 kg)   BMI 32.37 kg/m  Physical Exam  Constitutional: He is oriented to person, place, and time. He appears well-developed and well-nourished. No distress.  Neck: Neck supple. No hepatojugular reflux and no JVD present. Carotid bruit is not present.  Cardiovascular: Normal rate, regular rhythm and normal pulses.   Extrasystoles are present. Exam reveals distant heart sounds. Exam reveals no gallop and no midsystolic click.   No murmur heard. Pulmonary/Chest: Effort normal and breath sounds normal. No respiratory distress. He has no wheezes. He has no rales.  Abdominal: Soft. Bowel sounds are normal. He exhibits no distension. There is no rebound.  Musculoskeletal: Normal range of motion. He exhibits edema (Trivial). He exhibits no deformity.  Neurological: He is alert and oriented to person, place, and time.  Skin: Skin is warm and dry. No erythema.  Psychiatric: His behavior is normal. Judgment and thought content normal.  He just seems a little bit deflated and dejected and his mood today     Adult ECG Report n/a  Other studies Reviewed: Additional  studies/ records that were reviewed today include:  Recent Labs:   CBC Latest Ref Rng & Units 10/17/2016 07/08/2016 06/29/2016  WBC 3.4 - 10.8 x10E3/uL 6.3 8.9 7.2  Hemoglobin 13.0 - 17.7 g/dL 9.7(L) 13.1 12.2(L)  Hematocrit 37.5 - 51.0 % 32.1(L) 38.3 37.3(L)  Platelets 150 - 379 x10E3/uL 272 261 186    ASSESSMENT / PLAN: Problem List Items Addressed This Visit    Atypical atrial flutter (HCC) (Chronic)    He had atrial flutter ablation distant history. He is on diltiazem now for work for rate control. No longer on full and regulation as he is on aspirin and Plavix.      CAD S/P percutaneous coronary angioplasty - Primary (Chronic)    Recent PCI on aspirin and Plavix. Inverted back disc calcium channel blocker from beta blocker because of fatigue. Negative Myoview would suggest stents. It would also suggest a non-ischemic etiology for his symptoms.      Chronic anticoagulation (Chronic)   Relevant Orders   CBC   Coronary artery disease involving native coronary artery with angina pectoris (HCC) (Chronic)    No further angina status post PCI to the LAD and RCA. He just not bouncing back as quickly as out hope he would. Is on aspirin and Plavix. We stopped beta blocker because of fatigue and started low-dose diltiazem which she had been on for A. fib flutter in the past.  He is also on pravastatin.      Diastolic CHF, chronic (HCC) (Chronic)    History elevated LVEDP in the Cath Lab but normal function. No PND or orthopnea to suggest is relatively euvolemic. Monitor blood pressures may need to add Astelin reduction with ARB.  He is on metformin for diabetes, would consider the possibility of Jardiance fr DM & DHF benefit.      Relevant Orders   CBC   Comprehensive metabolic panel   Exertional dyspnea (Chronic)    Negative Myoview would argue against  ischemic etiology. He does have anemia and is starting to feel better having started iron supplementation. We will recheck CBC in a  month along with his baseline lipid labs including chemistries.      Relevant Orders   CBC   Comprehensive metabolic panel   Hyperlipidemia with target low density lipoprotein (LDL) cholesterol less than 70 mg/dL   Relevant Orders   Lipid panel   Comprehensive metabolic panel   ILD (interstitial lung disease) (HCC) (Chronic)    I truly think that this may be part of the reason for his exertional dyspnea and the fact that he is so deconditioned now. I tried to talk to doing cardiac rehabilitation, he says that he basically does more than the usually doing cardiac rehabilitation. He will look into it.      Iron deficiency anemia    On iron supplementation. We will recheck CBC in about a month.      Relevant Orders   CBC      Current medicines are reviewed at length with the patient today. (+/- concerns) still low E, but getting slowly better The following changes have been made: n/a  Patient Instructions  NO CHANGE WITH MEDICATIONS     LABS- SEPT 2018--- CBC,LIPIDS, CMP NOTHING TO EAT OR DRINK THE MORNING OF THE TEST NO APPOINTMENT NEEDED- [facility] IN THE OFFICE HOURS 8:00 AM - 4:30 PM     Your physician wants you to follow-up in JAN 2019 [place]. You will receive a reminder letter in the mail two months in advance. If you don't receive a letter, please call our office to schedule the follow-up appointment.   If you need a refill on your cardiac medications before your next appointment, please call your pharmacy.      Studies Ordered:   Orders Placed This Encounter  Procedures  . CBC  . Lipid panel  . Comprehensive metabolic panel      [name], M.D., M.S. Interventional Cardiologist   Pager # [phone] Phone # [phone] [address] [place], [place] [address]

## 2016-11-11 NOTE — Patient Instructions (Signed)
NO CHANGE WITH MEDICATIONS     LABS- SEPT 2018--- CBC,LIPIDS, CMP NOTHING TO EAT OR DRINK THE MORNING OF THE TEST NO APPOINTMENT NEEDED- [facility] IN THE OFFICE HOURS 8:00 AM - 4:30 PM     Your physician wants you to follow-up in JAN 2019 [place]. You will receive a reminder letter in the mail two months in advance. If you don't receive a letter, please call our office to schedule the follow-up appointment.   If you need a refill on your cardiac medications before your next appointment, please call your pharmacy.

## 2016-11-12 ENCOUNTER — Telehealth (HOSPITAL_COMMUNITY): Payer: Self-pay | Admitting: *Deleted

## 2016-11-12 NOTE — Assessment & Plan Note (Signed)
He had atrial flutter ablation distant history. He is on diltiazem now for work for rate control. No longer on full and regulation as he is on aspirin and Plavix.

## 2016-11-12 NOTE — Assessment & Plan Note (Signed)
No further angina status post PCI to the LAD and RCA. He just not bouncing back as quickly as out hope he would. Is on aspirin and Plavix. We stopped beta blocker because of fatigue and started low-dose diltiazem which she had been on for A. fib flutter in the past.  He is also on pravastatin.

## 2016-11-12 NOTE — Assessment & Plan Note (Signed)
History elevated LVEDP in the Cath Lab but normal function. No PND or orthopnea to suggest is relatively euvolemic. Monitor blood pressures may need to add Astelin reduction with ARB.  He is on metformin for diabetes, would consider the possibility of Jardiance fr DM & DHF benefit.

## 2016-11-12 NOTE — Assessment & Plan Note (Signed)
On iron supplementation. We will recheck CBC in about a month.

## 2016-11-12 NOTE — Assessment & Plan Note (Signed)
I truly think that this may be part of the reason for his exertional dyspnea and the fact that he is so deconditioned now. I tried to talk to doing cardiac rehabilitation, he says that he basically does more than the usually doing cardiac rehabilitation. He will look into it.

## 2016-11-12 NOTE — Assessment & Plan Note (Signed)
Recent PCI on aspirin and Plavix. Inverted back disc calcium channel blocker from beta blocker because of fatigue. Negative Myoview would suggest stents. It would also suggest a non-ischemic etiology for his symptoms.

## 2016-11-12 NOTE — Assessment & Plan Note (Signed)
Negative Myoview would argue against ischemic etiology. He does have anemia and is starting to feel better having started iron supplementation. We will recheck CBC in a month along with his baseline lipid labs including chemistries.

## 2016-11-13 ENCOUNTER — Encounter (HOSPITAL_COMMUNITY): Payer: Medicare Other

## 2016-11-15 ENCOUNTER — Encounter (HOSPITAL_COMMUNITY): Payer: Medicare Other

## 2016-11-18 ENCOUNTER — Encounter (HOSPITAL_COMMUNITY): Payer: Medicare Other

## 2016-11-20 ENCOUNTER — Encounter (HOSPITAL_COMMUNITY): Payer: Medicare Other

## 2016-11-21 ENCOUNTER — Telehealth (HOSPITAL_COMMUNITY): Payer: Self-pay | Admitting: *Deleted

## 2016-11-21 ENCOUNTER — Encounter (HOSPITAL_COMMUNITY): Payer: Self-pay | Admitting: *Deleted

## 2016-11-21 DIAGNOSIS — Z955 Presence of coronary angioplasty implant and graft: Secondary | ICD-10-CM

## 2016-11-21 NOTE — Progress Notes (Signed)
Cardiac Individual Treatment Plan  Patient Details  Name: Kenneth [name] MRN: [id number] Date of Birth: Oct 15, 1945 Referring Provider:     CARDIAC REHAB PHASE II ORIENTATION from 08/20/2016 in [place]  Referring Provider  [name] MD, and [name] MD      Initial Encounter Date:    CARDIAC REHAB PHASE II ORIENTATION from 08/20/2016 in [place]  Date  08/20/16  Referring Provider  [name] MD, and [name] MD      Visit Diagnosis: 06/28/16 and 07/10/16 Stented coronary artery  Patient's Home Medications on Admission:  Current Outpatient Prescriptions:  .  acetaminophen (TYLENOL) 500 MG tablet, Take 1,000 mg by mouth every 6 (six) hours as needed (for pain.)., Disp: , Rfl:  .  aspirin 81 MG chewable tablet, Chew 1 tablet (81 mg total) by mouth daily., Disp: 30 tablet, Rfl: 6 .  Biotin 1000 MCG tablet, Take 1,000 mcg by mouth at bedtime., Disp: , Rfl:  .  calcipotriene (DOVONOX) 0.005 % cream, Apply 1 application topically 2 (two) times daily as needed (for psoriasis.)., Disp: , Rfl:  .  Certolizumab Pegol (CIMZIA [place]), Inject 200 mg/kg into the skin every 14 (fourteen) days. , Disp: , Rfl:  .  cetirizine (ZYRTEC) 10 MG tablet, Take 10 mg by mouth daily., Disp: , Rfl:  .  clobetasol cream (TEMOVATE) 5.36 %, Apply 1 application topically 2 (two) times daily as needed (for psoriasis)., Disp: , Rfl:  .  clopidogrel (PLAVIX) 75 MG tablet, Take 1 tablet (75 mg total) by mouth daily with breakfast., Disp: 30 tablet, Rfl: 6 .  diltiazem (CARDIZEM CD) 120 MG 24 hr capsule, Take 1 capsule (120 mg total) by mouth daily., Disp: 90 capsule, Rfl: 3 .  Flurandrenolide (CORDRAN) 4 MCG/SQCM TAPE, Apply 1 each topically at bedtime. Applied to areas affected by psoriasis., Disp: , Rfl:  .  gabapentin (NEURONTIN) 300 MG capsule, Take 300 mg by mouth at bedtime., Disp: , Rfl:  .  Krill Oil 500 MG CAPS, Take 500 mg by mouth  at bedtime., Disp: , Rfl:  .  metFORMIN (GLUCOPHAGE) 500 MG tablet, Take by mouth 2 (two) times daily with a meal., Disp: , Rfl:  .  nitroGLYCERIN (NITROSTAT) 0.4 MG SL tablet, Place 1 tablet (0.4 mg total) under the tongue every 5 (five) minutes as needed for chest pain., Disp: 25 tablet, Rfl: 12 .  pantoprazole (PROTONIX) 40 MG tablet, Take 1 tablet (40 mg total) by mouth daily., Disp: 30 tablet, Rfl: 6 .  Polyethyl Glycol-Propyl Glycol (SYSTANE) 0.4-0.3 % GEL ophthalmic gel, Place 1 application into both eyes at bedtime., Disp: , Rfl:  .  pravastatin (PRAVACHOL) 40 MG tablet, Take 40 mg by mouth at bedtime., Disp: , Rfl:  .  predniSONE (DELTASONE) 5 MG tablet, TAKE ONE TABLET BY MOUTH ONCE DAILY WITH BREAKFAST, Disp: 60 tablet, Rfl: 1 .  Probiotic Product ([place]) CAPS, Take 1 capsule by mouth at bedtime., Disp: , Rfl:  .  zolpidem (AMBIEN) 10 MG tablet, Take 5-10 mg by mouth at bedtime as needed for sleep., Disp: , Rfl:  No current facility-administered medications for this visit.   Facility-Administered Medications Ordered in Other Visits:  .  lidocaine (cardiac) 100 mg/27ml (XYLOCAINE) 20 MG/ML injection 2%, , , Anesthesia Intra-op, Flowers, Rokoshi T, CRNA, 60 mg at 03/25/13 1617 .  propofol (DIPRIVAN) 10 mg/mL bolus/IV push, , , Anesthesia Intra-op, Flowers, Rokoshi T, CRNA, 90 mg at 03/25/13  61  Past Medical History: Past Medical History:  Diagnosis Date  . Adenomatous polyp of colon 2006  . Anemia   . Arthritis    "qwhere" (06/28/2016)  . CAD S/P percutaneous coronary angioplasty 06/2016   06/28/16 PCI with Synergy DES 2.75 mm 20 mm-->m RCA; 07/10/16 PCI with Synergy DES 3.5 mm x 20 mm-->mLAD  . Diabetes mellitus without complication ([place])    "suppose to be on Metformin but doesn't take it" (06/28/2016)  . Diverticulosis of colon (without mention of hemorrhage) 1999, 2006  . Dyspepsia   . Epiretinal membrane    "epiretinal attachment" (06/28/2016)  . Esophagitis 1999   . GERD (gastroesophageal reflux disease)   . Heart murmur   . Hiatal hernia 1999  . History of kidney stones   . HOH (hard of hearing)    "wears aids"   . Hyperlipidemia   . Hypertension   . 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 Paroxysmal atrial fibrillation ([place])    a. 11/2003 Tikosyn initiated - subsequently d/c'd;  b. 2006 RFCA for Afib @ [facility];  c. 09/2011 Echo: EF 60-65%, Gr 2 DD;  d. 10/2011 DCCV  . Pneumonia 1990s X 1  . Psoriasis 1967  . Rheumatic fever ~ 1958  . Seasonal allergies   . Skin cancer    on leg, left arm  . Sleep apnea    patient has had the sleep test, but states they said it didn't need treatment; "will repeat test at home" (06/28/2016)    Tobacco Use: History  Smoking Status  . Former Smoker  . Years: 10.00  . Types: Cigars  . Quit date: 09/29/2001  Smokeless Tobacco  . Former Systems developer  . Types: Snuff, Chew  . Quit date: 09/29/2001    Labs: Recent Review Flowsheet Data    Labs for ITP Cardiac and Pulmonary Rehab Latest Ref Rng & Units 08/17/2009 02/21/2011 06/28/2016 06/28/2016 06/29/2016   Cholestrol 0 - 200 mg/dL 246 ATP III CLASSIFICATION: <200     mg/dL   Desirable 200-239  mg/dL   Borderline High >=240    mg/dL   High(H) - - - -   LDLCALC 0 - 99 mg/dL UNABLE TO CALCULATE IF TRIGLYCERIDE OVER 400 mg/dL Total Cholesterol/HDL:CHD Risk Coronary Heart Disease Risk Table Men   Women 1/2 Average Risk   3.4   3.3 Average Risk       5.0   4.4 2 X Average Risk   9.6   7.1 3 X Average Risk  23.4   11.0 Use the calculated Patient Ratio above and the CHD Risk Table to determine the patient's CHD Risk. ATP III CLASSIFICATION (LDL): <100     mg/dL   Optimal 100-129  mg/dL   Near or Above Optimal 130-159  mg/dL   Borderline 160-189  mg/dL   High >190     mg/dL   Very High - - - -   HDL >39 mg/dL 26(L) - - - -   Trlycerides <150 mg/dL 866(H) - - - -   Hemoglobin A1c 4.8 - 5.6 % - 6.5 - - 7.5(H)   PHART 7.350 - 7.450 -  - 7.426 - -   PCO2ART 32.0 - 48.0 mmHg - - 33.0 - -   HCO3 20.0 - 28.0 mmol/L - - 21.7 23.6 -   TCO2 0 - 100 mmol/L 27 - 23 25 -   ACIDBASEDEF 0.0 - 2.0 mmol/L - - 2.0  1.0 -   O2SAT % - - 99.0 73.0 -      Capillary Blood Glucose: Lab Results  Component Value Date   GLUCAP 126 (H) 09/23/2016   GLUCAP 144 (H) 09/09/2016   GLUCAP 129 (H) 09/06/2016   GLUCAP 121 (H) 08/30/2016   GLUCAP 142 (H) 08/30/2016     Exercise Target Goals:    Exercise Program Goal: Individual exercise prescription set with THRR, safety & activity barriers. Participant demonstrates ability to understand and report RPE using BORG scale, to self-measure pulse accurately, and to acknowledge the importance of the exercise prescription.  Exercise Prescription Goal: Starting with aerobic activity 30 plus minutes a day, 3 days per week for initial exercise prescription. Provide home exercise prescription and guidelines that participant acknowledges understanding prior to discharge.  Activity Barriers & Risk Stratification:   6 Minute Walk:   Oxygen Initial Assessment:   Oxygen Re-Evaluation:   Oxygen Discharge (Final Oxygen Re-Evaluation):   Initial Exercise Prescription:   Perform Capillary Blood Glucose checks as needed.  Exercise Prescription Changes:      Exercise Prescription Changes    Row Name 09/25/16 1400             Response to Exercise   Blood Pressure (Admit) 144/68       Blood Pressure (Exercise) 134/64       Blood Pressure (Exit) 138/72       Heart Rate (Admit) 82 bpm       Heart Rate (Exercise) 118 bpm       Heart Rate (Exit) 92 bpm       Rating of Perceived Exertion (Exercise) 12       Duration Progress to 45 minutes of aerobic exercise without signs/symptoms of physical distress       Intensity THRR unchanged         Progression   Progression Continue to progress workloads to maintain intensity without signs/symptoms of physical distress.       Average METs 2.6          Resistance Training   Training Prescription Yes       Weight 3lbs       Reps 10-15         Bike   Level 1       Minutes 10       METs 2.58         NuStep   Level 4       SPM 70       Minutes 10       METs 2.2         Track   Laps 9       Minutes 10       METs 2.57         Home Exercise Plan   Plans to continue exercise at Home (comment)       Frequency Add 3 additional days to program exercise sessions.          Exercise Comments:      Exercise Comments    Row Name 09/25/16 1423 10/21/16 1201         Exercise Comments Pt is doing well with exercise.  Pt has been absent from CR for several weeks         Exercise Goals and Review:   Exercise Goals Re-Evaluation :     Exercise Goals Re-Evaluation    Row Name 09/25/16 1421  Exercise Goal Re-Evaluation   Exercise Goals Review Increase Physical Activity;Increase Strenth and Stamina       Comments pt is doing okay with exercise, his attendance is sporadic and he c/o not feeling well during his last visit so he wasn't able to exercise       Expected Outcomes Encouraged pt to get some rest and come back to CR when he is feeling better.  continue with exercise Rx and increase workloads as tolerated           Discharge Exercise Prescription (Final Exercise Prescription Changes):     Exercise Prescription Changes - 09/25/16 1400      Response to Exercise   Blood Pressure (Admit) 144/68   Blood Pressure (Exercise) 134/64   Blood Pressure (Exit) 138/72   Heart Rate (Admit) 82 bpm   Heart Rate (Exercise) 118 bpm   Heart Rate (Exit) 92 bpm   Rating of Perceived Exertion (Exercise) 12   Duration Progress to 45 minutes of aerobic exercise without signs/symptoms of physical distress   Intensity THRR unchanged     Progression   Progression Continue to progress workloads to maintain intensity without signs/symptoms of physical distress.   Average METs 2.6     Resistance Training   Training  Prescription Yes   Weight 3lbs   Reps 10-15     Bike   Level 1   Minutes 10   METs 2.58     NuStep   Level 4   SPM 70   Minutes 10   METs 2.2     Track   Laps 9   Minutes 10   METs 2.57     Home Exercise Plan   Plans to continue exercise at Home (comment)   Frequency Add 3 additional days to program exercise sessions.      Nutrition:  Target Goals: Understanding of nutrition guidelines, daily intake of sodium 1500mg , cholesterol 200mg , calories 30% from fat and 7% or less from saturated fats, daily to have 5 or more servings of fruits and vegetables.  Biometrics:    Nutrition Therapy Plan and Nutrition Goals:   Nutrition Discharge: Nutrition Scores:   Nutrition Goals Re-Evaluation:   Nutrition Goals Re-Evaluation:   Nutrition Goals Discharge (Final Nutrition Goals Re-Evaluation):   Psychosocial: Target Goals: Acknowledge presence or absence of significant depression and/or stress, maximize coping skills, provide positive support system. Participant is able to verbalize types and ability to use techniques and skills needed for reducing stress and depression.  Initial Review & Psychosocial Screening:   Quality of Life Scores:   PHQ-9: Recent Review Flowsheet Data    Depression screen [facility] 2/9 08/26/2016 07/18/2014   Decreased Interest 0 0   Down, Depressed, Hopeless 0 0   PHQ - 2 Score 0 0     Interpretation of Total Score  Total Score Depression Severity:  1-4 = Minimal depression, 5-9 = Mild depression, 10-14 = Moderate depression, 15-19 = Moderately severe depression, 20-27 = Severe depression   Psychosocial Evaluation and Intervention:   Psychosocial Re-Evaluation:     Psychosocial Re-Evaluation    [place] Name 09/25/16 1816             Psychosocial Re-Evaluation   Current issues with None Identified       Interventions Encouraged to attend Cardiac Rehabilitation for the exercise       Continue Psychosocial Services  No Follow up  required          Psychosocial Discharge (Final Psychosocial Re-Evaluation):  Psychosocial Re-Evaluation - 09/25/16 1816      Psychosocial Re-Evaluation   Current issues with None Identified   Interventions Encouraged to attend Cardiac Rehabilitation for the exercise   Continue Psychosocial Services  No Follow up required      Vocational Rehabilitation: Provide vocational rehab assistance to qualifying candidates.   Vocational Rehab Evaluation & Intervention:   Education: Education Goals: Education classes will be provided on a weekly basis, covering required topics. Participant will state understanding/return demonstration of topics presented.  Learning Barriers/Preferences:   Education Topics: Count Your Pulse:  -Group instruction provided by verbal instruction, demonstration, patient participation and written materials to support subject.  Instructors address importance of being able to find your pulse and how to count your pulse when at home without a heart monitor.  Patients get hands on experience counting their pulse with staff help and individually.   CARDIAC REHAB PHASE II EXERCISE from 09/06/2016 in [place]  Date  09/06/16  Instruction Review Code  2- meets goals/outcomes      Heart Attack, Angina, and Risk Factor Modification:  -Group instruction provided by verbal instruction, video, and written materials to support subject.  Instructors address signs and symptoms of angina and heart attacks.    Also discuss risk factors for heart disease and how to make changes to improve heart health risk factors.   Functional Fitness:  -Group instruction provided by verbal instruction, demonstration, patient participation, and written materials to support subject.  Instructors address safety measures for doing things around the house.  Discuss how to get up and down off the floor, how to pick things up properly, how to safely get out of a  chair without assistance, and balance training.   Meditation and Mindfulness:  -Group instruction provided by verbal instruction, patient participation, and written materials to support subject.  Instructor addresses importance of mindfulness and meditation practice to help reduce stress and improve awareness.  Instructor also leads participants through a meditation exercise.    Stretching for Flexibility and Mobility:  -Group instruction provided by verbal instruction, patient participation, and written materials to support subject.  Instructors lead participants through series of stretches that are designed to increase flexibility thus improving mobility.  These stretches are additional exercise for major muscle groups that are typically performed during regular warm up and cool down.   CARDIAC REHAB PHASE II EXERCISE from 09/06/2016 in [place]  Date  08/30/16  Educator  Dorna Bloom, [place] ACSM RCEP  Instruction Review Code  1- partially meets, needs review/practice      Hands Only CPR:  -Group verbal, video, and participation provides a basic overview of AHA guidelines for community CPR. Role-play of emergencies allow participants the opportunity to practice calling for help and chest compression technique with discussion of AED use.   Hypertension: -Group verbal and written instruction that provides a basic overview of hypertension including the most recent diagnostic guidelines, risk factor reduction with self-care instructions and medication management.    Nutrition I class: Heart Healthy Eating:  -Group instruction provided by PowerPoint slides, verbal discussion, and written materials to support subject matter. The instructor gives an explanation and review of the Therapeutic Lifestyle Changes diet recommendations, which includes a discussion on lipid goals, dietary fat, sodium, fiber, plant stanol/sterol esters, sugar, and the components of a  well-balanced, healthy diet.   Nutrition II class: Lifestyle Skills:  -Group instruction provided by PowerPoint slides, verbal discussion, and written materials to  support subject matter. The instructor gives an explanation and review of label reading, grocery shopping for heart health, heart healthy recipe modifications, and ways to make healthier choices when eating out.   Diabetes Question & Answer:  -Group instruction provided by PowerPoint slides, verbal discussion, and written materials to support subject matter. The instructor gives an explanation and review of diabetes co-morbidities, pre- and post-prandial blood glucose goals, pre-exercise blood glucose goals, signs, symptoms, and treatment of hypoglycemia and hyperglycemia, and foot care basics.   Diabetes Blitz:  -Group instruction provided by PowerPoint slides, verbal discussion, and written materials to support subject matter. The instructor gives an explanation and review of the physiology behind type 1 and type 2 diabetes, diabetes medications and rational behind using different medications, pre- and post-prandial blood glucose recommendations and Hemoglobin A1c goals, diabetes diet, and exercise including blood glucose guidelines for exercising safely.    Portion Distortion:  -Group instruction provided by PowerPoint slides, verbal discussion, written materials, and food models to support subject matter. The instructor gives an explanation of serving size versus portion size, changes in portions sizes over the last 20 years, and what consists of a serving from each food group.   Stress Management:  -Group instruction provided by verbal instruction, video, and written materials to support subject matter.  Instructors review role of stress in heart disease and how to cope with stress positively.     Exercising on Your Own:  -Group instruction provided by verbal instruction, power point, and written materials to support subject.   Instructors discuss benefits of exercise, components of exercise, frequency and intensity of exercise, and end points for exercise.  Also discuss use of nitroglycerin and activating EMS.  Review options of places to exercise outside of rehab.  Review guidelines for sex with heart disease.   Cardiac Drugs I:  -Group instruction provided by verbal instruction and written materials to support subject.  Instructor reviews cardiac drug classes: antiplatelets, anticoagulants, beta blockers, and statins.  Instructor discusses reasons, side effects, and lifestyle considerations for each drug class.   Cardiac Drugs II:  -Group instruction provided by verbal instruction and written materials to support subject.  Instructor reviews cardiac drug classes: angiotensin converting enzyme inhibitors (ACE-I), angiotensin II receptor blockers (ARBs), nitrates, and calcium channel blockers.  Instructor discusses reasons, side effects, and lifestyle considerations for each drug class.   Anatomy and Physiology of the Circulatory System:  Group verbal and written instruction and models provide basic cardiac anatomy and physiology, with the coronary electrical and arterial systems. Review of: AMI, Angina, Valve disease, Heart Failure, Peripheral Artery Disease, Cardiac Arrhythmia, Pacemakers, and the ICD.   Other Education:  -Group or individual verbal, written, or video instructions that support the educational goals of the cardiac rehab program.   Knowledge Questionnaire Score:   Core Components/Risk Factors/Patient Goals at Admission:   Core Components/Risk Factors/Patient Goals Review:    Core Components/Risk Factors/Patient Goals at Discharge (Final Review):    ITP Comments:   Comments:Arlen has been cleared to return to exercise per Dr Ellyn Hack. I have left a message for Mr Schopf to call me back. Mr Habibi has not attended an exercise seesion for several weeks.[name], RN,BSN 11/21/2016 3:38  PM

## 2016-11-22 ENCOUNTER — Encounter (HOSPITAL_COMMUNITY): Payer: Medicare Other

## 2016-11-22 DIAGNOSIS — Z9861 Coronary angioplasty status: Secondary | ICD-10-CM | POA: Insufficient documentation

## 2016-11-25 ENCOUNTER — Encounter (HOSPITAL_COMMUNITY): Payer: Medicare Other

## 2016-11-26 ENCOUNTER — Ambulatory Visit: Payer: Medicare Other | Admitting: Cardiology

## 2016-11-26 NOTE — Progress Notes (Signed)
Discharge Summary  Patient Details  Name: Kenneth [name] MRN: [id number] Date of Birth: 1945/09/25 Referring Provider:     CARDIAC REHAB PHASE II ORIENTATION from 08/20/2016 in [place]  Referring Provider  [name] MD, and [name] MD       Number of Visits: 6  Reason for Discharge:  Early Exit:  Personal and non attendance  Smoking History:  History  Smoking Status  . Former Smoker  . Years: 10.00  . Types: Cigars  . Quit date: 09/29/2001  Smokeless Tobacco  . Former Systems developer  . Types: Snuff, Chew  . Quit date: 09/29/2001    Diagnosis:  06/28/16 and 07/10/16 Stented coronary artery  ADL UCSD:   Initial Exercise Prescription:     Initial Exercise Prescription - 08/20/16 1100      Date of Initial Exercise RX and Referring Provider   Date 08/20/16   Referring Provider [name] MD, and [name] MD     Treadmill   MPH 2.3   Grade 1   Minutes 10   METs 3.08     Bike   Level 0.8   Minutes 10   METs 1.79     NuStep   Level 3   SPM 70   Minutes 10   METs 2     Prescription Details   Frequency (times per week) 3   Duration Progress to 30 minutes of continuous aerobic without signs/symptoms of physical distress     Intensity   THRR 40-80% of Max Heartrate 60-119   Ratings of Perceived Exertion 11-13   Perceived Dyspnea 0-4     Progression   Progression Continue to progress workloads to maintain intensity without signs/symptoms of physical distress.     Resistance Training   Training Prescription Yes   Weight 3lbs   Reps 10-15      Discharge Exercise Prescription (Final Exercise Prescription Changes):     Exercise Prescription Changes - 09/25/16 1400      Response to Exercise   Blood Pressure (Admit) 144/68   Blood Pressure (Exercise) 134/64   Blood Pressure (Exit) 138/72   Heart Rate (Admit) 82 bpm   Heart Rate (Exercise) 118 bpm   Heart Rate (Exit) 92 bpm   Rating of Perceived Exertion  (Exercise) 12   Duration Progress to 45 minutes of aerobic exercise without signs/symptoms of physical distress   Intensity THRR unchanged     Progression   Progression Continue to progress workloads to maintain intensity without signs/symptoms of physical distress.   Average METs 2.6     Resistance Training   Training Prescription Yes   Weight 3lbs   Reps 10-15     Bike   Level 1   Minutes 10   METs 2.58     NuStep   Level 4   SPM 70   Minutes 10   METs 2.2     Track   Laps 9   Minutes 10   METs 2.57     Home Exercise Plan   Plans to continue exercise at Home (comment)   Frequency Add 3 additional days to program exercise sessions.      Functional Capacity:     6 Minute Walk    Row Name 08/20/16 1034         6 Minute Walk   Phase Initial     Distance 1371 feet     Walk Time 6 minutes     # of Rest Breaks  0     MPH 2.59     METS 3.02     RPE 11     VO2 Peak 10.56     Symptoms No     Resting HR 70 bpm     Resting BP 142/80     Max Ex. HR 103 bpm     Max Ex. BP 176/76     2 Minute Post BP 130/80        Psychological, QOL, Others - Outcomes: PHQ 2/9: Depression screen [facility] 2/9 08/26/2016 07/18/2014  Decreased Interest 0 0  Down, Depressed, Hopeless 0 0  PHQ - 2 Score 0 0  Some recent data might be hidden    Quality of Life:   Personal Goals: Goals established at orientation with interventions provided to work toward goal.     Personal Goals and Risk Factors at Admission - 08/20/16 1114      Core Components/Risk Factors/Patient Goals on Admission    Weight Management Yes;Obesity;Weight Loss;Weight Maintenance   Intervention Weight Management: Develop a combined nutrition and exercise program designed to reach desired caloric intake, while maintaining appropriate intake of nutrient and fiber, sodium and fats, and appropriate energy expenditure required for the weight goal.;Weight Management: Provide education and appropriate resources to help  participant work on and attain dietary goals.;Weight Management/Obesity: Establish reasonable short term and long term weight goals.;Obesity: Provide education and appropriate resources to help participant work on and attain dietary goals.   Expected Outcomes Short Term: Continue to assess and modify interventions until short term weight is achieved;Long Term: Adherence to nutrition and physical activity/exercise program aimed toward attainment of established weight goal;Weight Maintenance: Understanding of the daily nutrition guidelines, which includes 25-35% calories from fat, 7% or less cal from saturated fats, less than 200mg  cholesterol, less than 1.5gm of sodium, & 5 or more servings of fruits and vegetables daily;Weight Loss: Understanding of general recommendations for a balanced deficit meal plan, which promotes 1-2 lb weight loss per week and includes a negative energy balance of [phone] kcal/d;Understanding recommendations for meals to include 15-35% energy as protein, 25-35% energy from fat, 35-60% energy from carbohydrates, less than 200mg  of dietary cholesterol, 20-35 gm of total fiber daily;Understanding of distribution of calorie intake throughout the day with the consumption of 4-5 meals/snacks   Diabetes Yes   Intervention Provide education about signs/symptoms and action to take for hypo/hyperglycemia.;Provide education about proper nutrition, including hydration, and aerobic/resistive exercise prescription along with prescribed medications to achieve blood glucose in normal ranges: Fasting glucose 65-99 mg/dL   Expected Outcomes Short Term: Participant verbalizes understanding of the signs/symptoms and immediate care of hyper/hypoglycemia, proper foot care and importance of medication, aerobic/resistive exercise and nutrition plan for blood glucose control.;Long Term: Attainment of HbA1C < 7%.   Hypertension Yes   Intervention Provide education on lifestyle modifcations including regular  physical activity/exercise, weight management, moderate sodium restriction and increased consumption of fresh fruit, vegetables, and low fat dairy, alcohol moderation, and smoking cessation.;Monitor prescription use compliance.   Expected Outcomes Short Term: Continued assessment and intervention until BP is < 140/17mm HG in hypertensive participants. < 130/34mm HG in hypertensive participants with diabetes, heart failure or chronic kidney disease.;Long Term: Maintenance of blood pressure at goal levels.   Lipids Yes   Intervention Provide education and support for participant on nutrition & aerobic/resistive exercise along with prescribed medications to achieve LDL 70mg , HDL >40mg .   Expected Outcomes Short Term: Participant states understanding of desired cholesterol values and is compliant  with medications prescribed. Participant is following exercise prescription and nutrition guidelines.;Long Term: Cholesterol controlled with medications as prescribed, with individualized exercise RX and with personalized nutrition plan. Value goals: LDL < 70mg , HDL > 40 mg.       Personal Goals Discharge:   Nutrition & Weight - Outcomes:     Pre Biometrics - 08/20/16 1034      Pre Biometrics   Height 6' 1.75" (1.873 m)   Weight 260 lb 2.3 oz (118 kg)   Waist Circumference 46 inches   Hip Circumference 44 inches   Waist to Hip Ratio 1.05 %   BMI (Calculated) 33.7   Triceps Skinfold 25 mm   % Body Fat 39.1 %   Grip Strength 47 kg   Flexibility 13 in   Single Leg Stand 8.24 seconds         Post Biometrics - 09/11/16 1546       Post  Biometrics   Height 6' 1.75" (1.873 m)   Weight 263 lb 0.1 oz (119.3 kg)   BMI (Calculated) 34.01      Nutrition:   Nutrition Discharge:   Education Questionnaire Score:     Knowledge Questionnaire Score - 08/20/16 1228      Knowledge Questionnaire Score   Pre Score 20/24      Fritz Pickerel attended 6 classes between 08/20/2016-09/11/2016. Danniel's  attendance was poor. After receiving clearance from Dr Ellyn Hack, Mr Hodsdon did not return to exercise. Philmore says he will exercise on his own.[name], RN,BSN 12/17/2016 10:16 AM

## 2016-11-27 ENCOUNTER — Encounter (HOSPITAL_COMMUNITY)
Admission: RE | Admit: 2016-11-27 | Discharge: 2016-11-27 | Disposition: A | Discharge status: Home / Self Care | Payer: Medicare Other | Source: Ambulatory Visit | Attending: Cardiology | Admitting: Cardiology

## 2016-11-27 DIAGNOSIS — Z955 Presence of coronary angioplasty implant and graft: Secondary | ICD-10-CM

## 2016-12-05 ENCOUNTER — Other Ambulatory Visit: Payer: Self-pay | Admitting: Physician Assistant

## 2016-12-05 NOTE — Telephone Encounter (Signed)
This is Dr. Harding's pt. °

## 2016-12-10 NOTE — Addendum Note (Signed)
Encounter addended by: Sol Passer on: 12/10/2016 12:32 PM<BR>    Actions taken: Flowsheet data copied forward, Visit Navigator Flowsheet section accepted

## 2016-12-13 ENCOUNTER — Other Ambulatory Visit: Payer: Self-pay | Admitting: Pulmonary Disease

## 2016-12-23 DIAGNOSIS — I5032 Chronic diastolic (congestive) heart failure: Secondary | ICD-10-CM | POA: Diagnosis not present

## 2016-12-23 DIAGNOSIS — Z7901 Long term (current) use of anticoagulants: Secondary | ICD-10-CM | POA: Diagnosis not present

## 2016-12-23 DIAGNOSIS — R0609 Other forms of dyspnea: Secondary | ICD-10-CM | POA: Diagnosis not present

## 2016-12-23 DIAGNOSIS — D500 Iron deficiency anemia secondary to blood loss (chronic): Secondary | ICD-10-CM | POA: Diagnosis not present

## 2016-12-23 DIAGNOSIS — E785 Hyperlipidemia, unspecified: Secondary | ICD-10-CM | POA: Diagnosis not present

## 2016-12-23 LAB — CBC
Hematocrit: 33.6 % — ABNORMAL LOW (ref 37.5–51.0)
Hemoglobin: 10.5 g/dL — ABNORMAL LOW (ref 13.0–17.7)
MCH: 22.8 pg — ABNORMAL LOW (ref 26.6–33.0)
MCHC: 31.3 g/dL — ABNORMAL LOW (ref 31.5–35.7)
MCV: 73 fL — ABNORMAL LOW (ref 79–97)
Platelets: 251 x10E3/uL (ref 150–379)
RBC: 4.61 x10E6/uL (ref 4.14–5.80)
RDW: 17.9 % — ABNORMAL HIGH (ref 12.3–15.4)
WBC: 5.8 x10E3/uL (ref 3.4–10.8)

## 2016-12-23 LAB — COMPREHENSIVE METABOLIC PANEL WITH GFR
ALT: 25 IU/L (ref 0–44)
AST: 23 IU/L (ref 0–40)
Albumin/Globulin Ratio: 1.9 (ref 1.2–2.2)
Albumin: 4.7 g/dL (ref 3.5–4.8)
Alkaline Phosphatase: 73 IU/L (ref 39–117)
BUN/Creatinine Ratio: 12 (ref 10–24)
BUN: 11 mg/dL (ref 8–27)
Bilirubin Total: 0.4 mg/dL (ref 0.0–1.2)
CO2: 23 mmol/L (ref 20–29)
Calcium: 10.5 mg/dL — ABNORMAL HIGH (ref 8.6–10.2)
Chloride: 103 mmol/L (ref 96–106)
Creatinine, Ser: 0.92 mg/dL (ref 0.76–1.27)
GFR calc Af Amer: 96 mL/min/1.73
GFR calc non Af Amer: 83 mL/min/1.73
Globulin, Total: 2.5 g/dL (ref 1.5–4.5)
Glucose: 115 mg/dL — ABNORMAL HIGH (ref 65–99)
Potassium: 4.3 mmol/L (ref 3.5–5.2)
Sodium: 141 mmol/L (ref 134–144)
Total Protein: 7.2 g/dL (ref 6.0–8.5)

## 2016-12-23 LAB — LIPID PANEL
Chol/HDL Ratio: 3.3 ratio (ref 0.0–5.0)
Cholesterol, Total: 177 mg/dL (ref 100–199)
HDL: 53 mg/dL
LDL Calculated: 90 mg/dL (ref 0–99)
Triglycerides: 170 mg/dL — ABNORMAL HIGH (ref 0–149)
VLDL Cholesterol Cal: 34 mg/dL (ref 5–40)

## 2016-12-27 DIAGNOSIS — Z23 Encounter for immunization: Secondary | ICD-10-CM | POA: Diagnosis not present

## 2016-12-27 NOTE — Progress Notes (Signed)
CBC was better. Hemoglobin levels are up from months ago. Still in the "anemic range", but better. Cholesterol panel looks better, but still not at goal.  I would like to see can switch from pravastatin 40 mg - change to rosuvastatin 40 mg.  [name], MD  pls fwd to PCP: [facility]

## 2017-01-03 ENCOUNTER — Ambulatory Visit: Payer: Medicare Other | Admitting: Cardiology

## 2017-01-07 ENCOUNTER — Other Ambulatory Visit: Payer: Self-pay | Admitting: Physician Assistant

## 2017-01-17 DIAGNOSIS — Z23 Encounter for immunization: Secondary | ICD-10-CM | POA: Diagnosis not present

## 2017-01-22 DIAGNOSIS — H04123 Dry eye syndrome of bilateral lacrimal glands: Secondary | ICD-10-CM | POA: Diagnosis not present

## 2017-01-22 DIAGNOSIS — H16223 Keratoconjunctivitis sicca, not specified as Sjogren's, bilateral: Secondary | ICD-10-CM | POA: Diagnosis not present

## 2017-01-22 DIAGNOSIS — H52203 Unspecified astigmatism, bilateral: Secondary | ICD-10-CM | POA: Diagnosis not present

## 2017-01-22 DIAGNOSIS — H35373 Puckering of macula, bilateral: Secondary | ICD-10-CM | POA: Diagnosis not present

## 2017-01-22 DIAGNOSIS — H524 Presbyopia: Secondary | ICD-10-CM | POA: Diagnosis not present

## 2017-01-28 ENCOUNTER — Other Ambulatory Visit: Payer: Self-pay | Admitting: Physician Assistant

## 2017-02-24 ENCOUNTER — Other Ambulatory Visit: Payer: Self-pay | Admitting: Pulmonary Disease

## 2017-05-09 ENCOUNTER — Other Ambulatory Visit: Payer: Self-pay | Admitting: Pulmonary Disease

## 2017-05-09 ENCOUNTER — Telehealth: Payer: Self-pay | Admitting: Pulmonary Disease

## 2017-05-09 MED ORDER — AZITHROMYCIN 250 MG PO TABS: 250.0000 mg | ORAL_TABLET | Freq: Every day | ORAL | 0 refills | Status: DC

## 2017-05-09 MED ORDER — PREDNISONE 10 MG PO TABS: ORAL_TABLET | ORAL | 0 refills | Status: DC

## 2017-05-09 NOTE — Telephone Encounter (Signed)
Spoke with Kenneth [name], pt has been coughing a lot and not very productive which started Thursday or Frisday of last week.Marland Kitchen He is unable to sleep and they are scared since his sister died of an illness with Sarcoidosis. He is getting worse and may need an OV but RA has no openings. She denies he has a fever, sore throat, but does have  body aches. RA please advise.    [employer] on [address]

## 2017-05-09 NOTE — Telephone Encounter (Signed)
Looked at our providers schedules and Dr. Melvyn Novas has some openings on Monday.  Dr. Elsworth Soho, you do not have any openings and wanted to know if you were okay with pt seeing Dr. Melvyn Novas or if you preferred him to have an appt with you.  Please advise.  Thanks!

## 2017-05-09 NOTE — Telephone Encounter (Signed)
Please offer him an appointment with Dr. Melvyn Novas on Monday. Otherwise I can see him on Wednesday as a double book

## 2017-05-09 NOTE — Telephone Encounter (Signed)
Please arrange office visit ASAP If no openings, then okay to double book me on Wednesday. Meantime causing a Z-Pak and Prednisone 10 mg tabs  Take 2 tabs daily with food x 5ds, then 1 tab daily with food x 5ds then STOP  Please confirm that he is not having myalgias or fevers to suggest flu

## 2017-05-09 NOTE — Telephone Encounter (Signed)
Called and spoke with Judeen Hammans, pt's wife telling them [facility] had an opening on Monday we could get pt in for a visit at but Judeen Hammans stated pt had a cardiology appt that day and wanted to see RA.  RA okayed [place] to DB pt at 9:00 on 05/14/17.  Sent an Rx of pred and zpak to pt's pharmacy.  Asked Judeen Hammans if pt has had a fever or any body aches and if he has coughed up any mucus.  Judeen Hammans stated pt has not run a fever but has had some body aches but is unsure if it is from the coughing or if it is actual body aches.  Judeen Hammans also stated that pt did cough up some yellowish-green mucus today.  Asked RA if he wanted to change the Rx we sent in on pt due to these symptoms and RA stated to stay with the original Rx's stated.  Nothing further needed at this current time.

## 2017-05-12 ENCOUNTER — Encounter: Payer: Self-pay | Admitting: Physician Assistant

## 2017-05-12 ENCOUNTER — Ambulatory Visit (INDEPENDENT_AMBULATORY_CARE_PROVIDER_SITE_OTHER): Payer: Medicare Other | Admitting: Physician Assistant

## 2017-05-12 VITALS — BP 142/78 | HR 80 | Ht 75.0 in | Wt 252.0 lb

## 2017-05-12 DIAGNOSIS — I10 Essential (primary) hypertension: Secondary | ICD-10-CM | POA: Diagnosis not present

## 2017-05-12 DIAGNOSIS — Z9861 Coronary angioplasty status: Secondary | ICD-10-CM | POA: Diagnosis not present

## 2017-05-12 DIAGNOSIS — E1159 Type 2 diabetes mellitus with other circulatory complications: Secondary | ICD-10-CM | POA: Insufficient documentation

## 2017-05-12 DIAGNOSIS — I5032 Chronic diastolic (congestive) heart failure: Secondary | ICD-10-CM

## 2017-05-12 DIAGNOSIS — I484 Atypical atrial flutter: Secondary | ICD-10-CM | POA: Diagnosis not present

## 2017-05-12 DIAGNOSIS — E785 Hyperlipidemia, unspecified: Secondary | ICD-10-CM

## 2017-05-12 DIAGNOSIS — I152 Hypertension secondary to endocrine disorders: Secondary | ICD-10-CM | POA: Insufficient documentation

## 2017-05-12 DIAGNOSIS — G4733 Obstructive sleep apnea (adult) (pediatric): Secondary | ICD-10-CM

## 2017-05-12 DIAGNOSIS — I2510 Atherosclerotic heart disease of native coronary artery without angina pectoris: Secondary | ICD-10-CM | POA: Diagnosis not present

## 2017-05-12 DIAGNOSIS — E119 Type 2 diabetes mellitus without complications: Secondary | ICD-10-CM | POA: Insufficient documentation

## 2017-05-12 DIAGNOSIS — J849 Interstitial pulmonary disease, unspecified: Secondary | ICD-10-CM

## 2017-05-12 MED ORDER — DILTIAZEM HCL ER COATED BEADS 180 MG PO CP24: 180.0000 mg | ORAL_CAPSULE | Freq: Every day | ORAL | 6 refills | Status: DC

## 2017-05-12 NOTE — Progress Notes (Signed)
Cardiology Office Note:    Date:  05/12/2017   ID:  Kenneth [name], DOB Nov 24, 1945, MRN [id number]  PCP:  [facility]  Cardiologist:  [name], MD   Referring MD: [facility]   Chief Complaint  Patient presents with  . 5 mo f/u    pt c/o of cold Sx, has been on Zpack for 4 days, would like to know if he can take cough med--has been coughing a lot at night; no other Sx.    History of Present Illness:    Kenneth [name] is a 72 y.o. male with a hx of atypical atrial flutter s/p ablation and on diltiazem, no anticoagulation on ASA and plavix, CAD s/p PCI 06/2016 to LAD and RCA and negative myoview for reversible ischemia 04/9507, chronic diastolic heart failure with normal function in cath lab, interstitial lung disease, HLD, HTN, iron deficiency anemia, and DM.   He was seen by Almyra Deforest PA-C and Dr. Ellyn Hack with symptoms of fatigue and DOE. He had a myoview negative for ischemia, suggesting his symptoms were not likely related to ischemia. Dr. Ellyn Hack noted his symptoms are related to interstitial lung disease. Beta blocker was changed to low-dose diltiazem for fatigue.   Labs 12/2016 with improving anemia, lipid panel improved but not at goal. Dr. Ellyn Hack suggested changing from pravastatin to rosuvastatin. He has not participated in cardiac rehab and was discharged for nonattendance.  He recently contacted Dr. Bari Mantis office for increased coughing productive of yellowish-green mucus. He was prescribed Z-pack and prednisone taper. Per phone notes, he has been having body aches  He presents today for follow up. He has been sick recently and still coughs during exam. He states the cough is now nonproductive. He states he thought he would feel better after the ABX, but does not and is not sleeping well at night due to cough. He is scheduled to see pulmonology this week.   He denies chest pain, lower extremity swelling, and bleeding problems. His pressure is elevated  today to 142/78. He states his pressure runs in the 140s at home. We discussed HTN goals less than 140/90 given his diabetes. Will increase cardizem for better pressure control. He states he is very active and will likely run for public office in 3267.  Will check lipids today for LDL goal less than 70.  He states he has palpitations and says this is when his heart is "going in and out of rhythm." He used to touch an electric fence to treat his palpitations. I asked him not to do this anymore. He is asymptomatic during the palpitations and they last only about a second for approximately 4 beats.   Past Medical History:  Diagnosis Date  . Adenomatous polyp of colon 2006  . Anemia   . Arthritis    "qwhere" (06/28/2016)  . CAD S/P percutaneous coronary angioplasty 06/2016   06/28/16 PCI with Synergy DES 2.75 mm 20 mm-->m RCA; 07/10/16 PCI with Synergy DES 3.5 mm x 20 mm-->mLAD  . Diabetes mellitus without complication ([place])    "suppose to be on Metformin but doesn't take it" (06/28/2016)  . Diverticulosis of colon (without mention of hemorrhage) 1999, 2006  . Dyspepsia   . Epiretinal membrane    "epiretinal attachment" (06/28/2016)  . Esophagitis 1999  . GERD (gastroesophageal reflux disease)   . Heart murmur   . Hiatal hernia 1999  . History of kidney stones   . HOH (hard of hearing)    "wears  aids"   . Hyperlipidemia   . Hypertension   . 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 Paroxysmal atrial fibrillation ([place])    a. 11/2003 Tikosyn initiated - subsequently d/c'd;  b. 2006 RFCA for Afib @ [facility];  c. 09/2011 Echo: EF 60-65%, Gr 2 DD;  d. 10/2011 DCCV  . Pneumonia 1990s X 1  . Psoriasis 1967  . Rheumatic fever ~ 1958  . Seasonal allergies   . Skin cancer    on leg, left arm  . Sleep apnea    patient has had the sleep test, but states they said it didn't need treatment; "will repeat test at home" (06/28/2016)    Past Surgical History:  Procedure  Laterality Date  . APPENDECTOMY  1996  . ATRIAL FIBRILLATION ABLATION N/A 01/13/2014   repeat PVI, also left atrial ablation performed with successfull ablation of LA flutter by Dr Rayann Heman  . ATRIAL FIBRILLATION ABLATION  2006   Dr Rolland Porter at [facility]  . CARDIAC CATHETERIZATION  2005  . CARDIOVERSION N/A 05/22/2013   Procedure: CARDIOVERSION;  Surgeon: [name], MD;  Location: [facility];  Service: Cardiovascular;  Laterality: N/A;  . CARDIOVERSION N/A 10/23/2011   Procedure: CARDIOVERSION;  Surgeon: Deboraha Sprang, MD;  Location: [facility] CATH LAB;  Service: Cardiovascular;  Laterality: N/A;  . CARDIOVERSION N/A 05/01/2012   Procedure: CARDIOVERSION;  Surgeon: Deboraha Sprang, MD;  Location: [facility] CATH LAB;  Service: Cardiovascular;  Laterality: N/A;  . CARPAL TUNNEL RELEASE Right 1980s?  Marland Kitchen CATARACT EXTRACTION W/ INTRAOCULAR LENS  IMPLANT, BILATERAL Bilateral   . COLONOSCOPY    . CORONARY STENT INTERVENTION N/A 06/28/2016   Procedure: Coronary Stent Intervention;  Surgeon: Leonie Man, MD;  Location: [facility] CV LAB;  Service: Cardiovascular;  Laterality: mRCA PCI -Synergy DES 2.75 m x 20 mm (3.1 mm)  . CORONARY STENT INTERVENTION N/A 07/10/2016   Procedure: Coronary Stent Intervention;  Surgeon: Leonie Man, MD;  Location: [place] CV LAB;  Service: Cardiovascular: mLAD PCI Synergy DES 3.5 mm x 20 mm)  . CYSTOSCOPY W/ STONE MANIPULATION  2010   Patient states he has had several kidney stones up until aroune 2010, none since (06/28/2016)  . ESOPHAGOGASTRODUODENOSCOPY    . INTRAVASCULAR PRESSURE WIRE/FFR STUDY N/A 06/28/2016   Procedure: Intravascular Pressure Wire/FFR Study;  Surgeon: Leonie Man, MD;  Location: [facility] CV LAB;  Service: Cardiovascular: FFR of mLAD ~65% lesion = 0.75 post --> Staged PCI  . NASAL SINUS SURGERY     x 2  . RIGHT/LEFT HEART CATH AND CORONARY ANGIOGRAPHY N/A 06/28/2016   Procedure: Right/Left Heart Cath and Coronary Angiography;  Surgeon: Leonie Man, MD;   Location: [facility] CV LAB;  Service: Cardiovascular: mRCA 99% (TIMI 2), mLAD ~65% (FFR 0.75), OM3 55%. mild Pulm HTN. Mod LVEDP elevation. EF 55-65%.  . SHOULDER OPEN ROTATOR CUFF REPAIR Bilateral   . TONSILLECTOMY      Current Medications: Current Meds  Medication Sig  . acetaminophen (TYLENOL) 500 MG tablet Take 1,000 mg by mouth every 6 (six) hours as needed (for pain.).  Marland Kitchen aspirin 81 MG chewable tablet CHEW AND SWALLOW 1 TABLET BY MOUTH ONCE DAILY  . azithromycin (ZITHROMAX) 250 MG tablet Take 1 tablet (250 mg total) by mouth daily.  . Biotin 1000 MCG tablet Take 1,000 mcg by mouth at bedtime.  . calcipotriene (DOVONOX) 0.005 % cream Apply 1 application topically 2 (two) times daily  as needed (for psoriasis.).  Marland Kitchen Certolizumab Pegol (CIMZIA Craig) Inject 200 mg/kg into the skin every 14 (fourteen) days.   . cetirizine (ZYRTEC) 10 MG tablet Take 10 mg by mouth daily.  . clobetasol cream (TEMOVATE) 1.61 % Apply 1 application topically 2 (two) times daily as needed (for psoriasis).  . clopidogrel (PLAVIX) 75 MG tablet TAKE 1 TABLET BY MOUTH ONCE DAILY WITH BREAKFAST  . Flurandrenolide (CORDRAN) 4 MCG/SQCM TAPE Apply 1 each topically at bedtime. Applied to areas affected by psoriasis.  Marland Kitchen gabapentin (NEURONTIN) 300 MG capsule Take 300 mg by mouth at bedtime.  Javier Docker Oil 500 MG CAPS Take 500 mg by mouth at bedtime.  . metFORMIN (GLUCOPHAGE) 500 MG tablet Take by mouth 2 (two) times daily with a meal.  . nitroGLYCERIN (NITROSTAT) 0.4 MG SL tablet Place 1 tablet (0.4 mg total) under the tongue every 5 (five) minutes as needed for chest pain.  . pantoprazole (PROTONIX) 40 MG tablet TAKE 1 TABLET BY MOUTH ONCE DAILY  . Polyethyl Glycol-Propyl Glycol (SYSTANE) 0.4-0.3 % GEL ophthalmic gel Place 1 application into both eyes at bedtime.  . pravastatin (PRAVACHOL) 40 MG tablet Take 40 mg by mouth at bedtime.  . predniSONE (DELTASONE) 10 MG tablet Take 2tabs daily x5days, then 1tab daily x5days, then  stop  . predniSONE (DELTASONE) 5 MG tablet TAKE 1 TABLET BY MOUTH ONCE DAILY WITH BREAKFAST  . Probiotic Product (Brooks) CAPS Take 1 capsule by mouth at bedtime.  Marland Kitchen zolpidem (AMBIEN) 10 MG tablet Take 5-10 mg by mouth at bedtime as needed for sleep.     Allergies:   Hydrocodone-acetaminophen; Other; Oxycodone-acetaminophen; Pseudoephedrine; and Tramadol   Social History   Socioeconomic History  . Marital status: Married    Spouse name: None  . Number of children: 1  . Years of education: None  . Highest education level: None  Social Needs  . Financial resource strain: None  . Food insecurity - worry: None  . Food insecurity - inability: None  . Transportation needs - medical: None  . Transportation needs - non-medical: None  Occupational History  . Occupation: Retired Armed forces operational officer  Tobacco Use  . Smoking status: Former Smoker    Years: 10.00    Types: Cigars    Last attempt to quit: 09/29/2001    Years since quitting: 15.6  . Smokeless tobacco: Former Systems developer    Types: Snuff, Sarina Ser    Quit date: 09/29/2001  Substance and Sexual Activity  . Alcohol use: No  . Drug use: No  . Sexual activity: None  Other Topics Concern  . None  Social History Narrative   Pt lives in [place] with spouse.     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     Family History: The patient's family history includes Breast cancer in his mother; Colon cancer in his mother; Coronary artery disease in his mother and paternal grandfather; Heart disease in his maternal grandmother; Stroke in his paternal grandfather. There is no history of Esophageal cancer, Stomach cancer, or Rectal cancer.  ROS:   Please see the history of present illness.    All other systems reviewed and are negative.  EKGs/Labs/Other Studies Reviewed:    The following studies were reviewed today:  Heart cath 06/28/16:  Mid RCA lesion, 99 %stenosed.  A STENT SYNERGY DES 2.75X20 drug eluting stent  was successfully placed.  Post intervention, there is a 0% residual stenosis.  Mid LAD lesion, 65 %stenosed. FFR performed =>  final FFR 0.75 (physiologically significant). Plan staged PCI  3rd Mrg lesion, 55 %stenosed.  Hemodynamic findings consistent with mild pulmonary hypertension. LV end diastolic pressure is moderately elevated.  The left ventricular systolic function is normal.  The left ventricular ejection fraction is 55-65% by visual estimate.    Mild secondary pulmonary hypertension  Severe 2 Vessel disease - 95% mRCA s/p PCI with DES; ~70% prox LAD with FFR 0.75  Codominant Cx with distal ~60% lesion prior to bifurcation into distal LPL branches  Plan: Overnight monitoring post cath. He will need close monitoring of the right groin hematoma. Likely discharge tomorrow. We'll start scheduled PCI LAD 1st week of April (~April 3rd)- would plan same day d/c  Continue Plavix. Add Statin for d/c.  EKG:  EKG is ordered today.  The ekg ordered today demonstrates sinus rhythm.  Recent Labs: 06/25/2016: TSH 1.018 12/23/2016: ALT 25; BUN 11; Creatinine, Ser 0.92; Hemoglobin 10.5; Platelets 251; Potassium 4.3; Sodium 141  Recent Lipid Panel    Component Value Date/Time   CHOL 177 12/23/2016 0845   TRIG 170 (H) 12/23/2016 0845   HDL 53 12/23/2016 0845   CHOLHDL 3.3 12/23/2016 0845   CHOLHDL 9.5 08/17/2009 0444   VLDL UNABLE TO CALCULATE IF TRIGLYCERIDE OVER 400 mg/dL 08/17/2009 0444   LDLCALC 90 12/23/2016 0845    Physical Exam:    VS:  BP (!) 142/78 (BP Location: Right Arm, Patient Position: Sitting, Cuff Size: Large)   Pulse 80   Ht 6\' 3"  (1.905 m)   Wt 252 lb (114.3 kg)   BMI 31.50 kg/m     Wt Readings from Last 3 Encounters:  05/12/17 252 lb (114.3 kg)  11/11/16 259 lb (117.5 kg)  10/22/16 259 lb (117.5 kg)     GEN:  Well nourished, well developed in no acute distress HEENT: Normal NECK: No JVD; No carotid bruits LYMPHATICS: No lymphadenopathy CARDIAC:  RRR, no murmurs, rubs, gallops RESPIRATORY:  Clear to auscultation in upper lobes, scattered crackles in right base, respirations unlabored, nonproductive cough  ABDOMEN: Soft, non-tender, non-distended MUSCULOSKELETAL:  No edema; No deformity  SKIN: Warm and dry NEUROLOGIC:  Alert and oriented x 3 PSYCHIATRIC:  Normal affect   ASSESSMENT and PLAN:   1. CAD s/p PCI to RCA and LAD 06/2016; negative myoview 10/2016 He denies chest pain and shortness of breath. Continue ASA and plavix. No issues with bleeding. Continue diltiazem. Will check CBC today.  2. Atrial flutter/fib EKG today with sinus rhythm. Will increase diltiazem to 180 mg daily for pressure. Pt c/o palpitations and assumes his heart is going in and out of rhythm, but these last only about "4 beats." The palpitations he is feeling are likely premature complexes. Not on anticoagulation in the setting of ASA and plavix.    3. HLD 12/23/2016: Cholesterol, Total 177; HDL 53; LDL Calculated 90; Triglycerides 170  LDL goal is less than 70. Will recheck lipid panel and LFTs today. If not at goal, will switch to crestor 40 mg.   4. Sleep apnea Pt is to see Dr. Elsworth Soho for repeat sleep study.  5. Chronic diastolic heart failure Pt is euvolemic on exam. Pt is not on diuretic regimen at home. He complies with a low sodium diet. No changes.   6. Interstitial lung disease Follow up with pulmonology.  7.HTN Systolic pressures have been in the 140s. Given his DM, will increase diltiazem to 180 mg daily. Continue logging pressure at home.   8. DM Consider jardiance. Will defer  to PCP.     Medication Adjustments/Labs and Tests Ordered: Current medicines are reviewed at length with the patient today.  Concerns regarding medicines are outlined above.  Orders Placed This Encounter  Procedures  . CBC  . Comprehensive Metabolic Panel (CMET)  . Lipid panel  . EKG 12-Lead   Meds ordered this encounter  Medications  . diltiazem (CARDIZEM  CD) 180 MG 24 hr capsule    Sig: Take 1 capsule (180 mg total) by mouth daily.    Dispense:  30 capsule    Refill:  6    Signed, Ledora Bottcher, [place]  05/12/2017 11:22 AM    [place]

## 2017-05-12 NOTE — Patient Instructions (Addendum)
Medication Instructions:   INCREASE diltiazem to 180mg  daily  Labwork: Your physician recommends that you return for lab work in: TODAY-CBC, CMET, LIPID  Testing/Procedures: None   Follow-Up: Your physician wants you to follow-up in: 6 months with Dr Ellyn Hack. You will receive a reminder letter in the mail two months in advance. If you don't receive a letter, please call our office to schedule the follow-up appointment.  Any Other Special Instructions Will Be Listed Below (If Applicable). If you need a refill on your cardiac medications before your next appointment, please call your pharmacy.

## 2017-05-13 LAB — CBC
HEMATOCRIT: 37.3 % — AB (ref 37.5–51.0)
Hemoglobin: 12.5 g/dL — ABNORMAL LOW (ref 13.0–17.7)
MCH: 27.8 pg (ref 26.6–33.0)
MCHC: 33.5 g/dL (ref 31.5–35.7)
MCV: 83 fL (ref 79–97)
PLATELETS: 242 10*3/uL (ref 150–379)
RBC: 4.5 x10E6/uL (ref 4.14–5.80)
RDW: 15.9 % — AB (ref 12.3–15.4)
WBC: 9.3 10*3/uL (ref 3.4–10.8)

## 2017-05-13 LAB — LIPID PANEL
CHOL/HDL RATIO: 3.6 ratio (ref 0.0–5.0)
Cholesterol, Total: 174 mg/dL (ref 100–199)
HDL: 49 mg/dL (ref >39)
LDL CALC: 86 mg/dL (ref 0–99)
TRIGLYCERIDES: 197 mg/dL — AB (ref 0–149)
VLDL Cholesterol Cal: 39 mg/dL (ref 5–40)

## 2017-05-13 LAB — COMPREHENSIVE METABOLIC PANEL
ALT: 27 IU/L (ref 0–44)
AST: 25 IU/L (ref 0–40)
Albumin/Globulin Ratio: 1.5 (ref 1.2–2.2)
Albumin: 4.3 g/dL (ref 3.5–4.8)
Alkaline Phosphatase: 78 IU/L (ref 39–117)
BUN/Creatinine Ratio: 17 (ref 10–24)
BUN: 14 mg/dL (ref 8–27)
Bilirubin Total: 0.3 mg/dL (ref 0.0–1.2)
CALCIUM: 9.3 mg/dL (ref 8.6–10.2)
CO2: 23 mmol/L (ref 20–29)
CREATININE: 0.82 mg/dL (ref 0.76–1.27)
Chloride: 102 mmol/L (ref 96–106)
GFR calc Af Amer: 103 mL/min/{1.73_m2} (ref >59)
GFR, EST NON AFRICAN AMERICAN: 89 mL/min/{1.73_m2} (ref >59)
GLUCOSE: 125 mg/dL — AB (ref 65–99)
Globulin, Total: 2.8 g/dL (ref 1.5–4.5)
Potassium: 4.6 mmol/L (ref 3.5–5.2)
Sodium: 143 mmol/L (ref 134–144)
Total Protein: 7.1 g/dL (ref 6.0–8.5)

## 2017-05-14 ENCOUNTER — Ambulatory Visit (INDEPENDENT_AMBULATORY_CARE_PROVIDER_SITE_OTHER): Payer: Medicare Other | Admitting: Pulmonary Disease

## 2017-05-14 ENCOUNTER — Encounter: Payer: Self-pay | Admitting: Pulmonary Disease

## 2017-05-14 ENCOUNTER — Ambulatory Visit (INDEPENDENT_AMBULATORY_CARE_PROVIDER_SITE_OTHER)
Admission: RE | Admit: 2017-05-14 | Discharge: 2017-05-14 | Disposition: A | Discharge status: Home / Self Care | Payer: Medicare Other | Source: Ambulatory Visit | Attending: Pulmonary Disease | Admitting: Pulmonary Disease

## 2017-05-14 VITALS — BP 138/78 | HR 84 | Ht 75.0 in | Wt 251.0 lb

## 2017-05-14 DIAGNOSIS — R05 Cough: Secondary | ICD-10-CM | POA: Diagnosis not present

## 2017-05-14 DIAGNOSIS — J849 Interstitial pulmonary disease, unspecified: Secondary | ICD-10-CM

## 2017-05-14 DIAGNOSIS — G4733 Obstructive sleep apnea (adult) (pediatric): Secondary | ICD-10-CM

## 2017-05-14 DIAGNOSIS — I2510 Atherosclerotic heart disease of native coronary artery without angina pectoris: Secondary | ICD-10-CM

## 2017-05-14 DIAGNOSIS — Z9861 Coronary angioplasty status: Secondary | ICD-10-CM

## 2017-05-14 MED ORDER — PREDNISONE 10 MG PO TABS: ORAL_TABLET | ORAL | 0 refills | Status: DC

## 2017-05-14 MED ORDER — HYDROCODONE-HOMATROPINE 5-1.5 MG/5ML PO SYRP: 5.0000 mL | ORAL_SOLUTION | Freq: Four times a day (QID) | ORAL | 0 refills | Status: DC | PRN

## 2017-05-14 NOTE — Assessment & Plan Note (Signed)
Chest x-ray today-based on this we will decide about HRCT We will treat as flare of NSIP -improved already in lab he did not desaturate on walking   Increase prednisone to 10 mg 2 weeks and then go back down to 5 mg. Hycodan cough syrup 5 mL bedtime as needed #120 ml  Please send me a report from rheumatologist so that we can decide about long-term prednisone or discuss alternatives

## 2017-05-14 NOTE — Assessment & Plan Note (Signed)
Sleep study was ordered in the past but he has not want to pursue this.  He has lost some weight and feels very significantly improved as is his snoring

## 2017-05-14 NOTE — Progress Notes (Signed)
Subjective:    Patient ID: Kenneth [name], male    DOB: 1945-04-09, 72 y.o.   MRN: [id number]  HPI  72 yo remote smoker (cigars) with psoriasis for FU of Steroid responsive ILD, favor NSIP  He has atrial fibrillation for 15 years s/p atrial ablation 01/2014 . He takes flecainide if his heart rate gets too high.  He was on Enbrel and methotrexate weekly for 3 years -switched to Humira.  He worked for a [employer] and is now retired, he works on Insurance underwriter with a Advice worker He smokes cigars quit about 20 years ago   On Humira for psorasis .  - on 10mg  pred since 07/2014 , he is not clear if he has been diagnosed with rheumatoid arthritis are not, hence a referral to [facility]ology at the [facility]. He was lost to follow-up for about a year, stayed on 5 mg of prednisone occasionally going up to 10 mg and he felt that his breathing was worse. He now reports worsening cough that came on insidiously about a week ago, no preceding URI, fevers or sick contacts.  Breathing is about the same.  Coughing was really bad and we called in prednisone 20 mg and Z-Pak, after taking this he feels about 50% improved  He has changed to cosentyx  injections for his arthritis from his dermatologist  He did not desaturate on walking today Prior CAT scan showed calcified coronaries and he underwent angiogram and stents were placed in his LAD and other vessel  Significant tests/ events CT angiogram 09/2007 which shows bilateral groundglass opacities but a repeat CT in 10/2007 and shows resolution of these infiltrates. Myocardial perfusion imaging on 11/2011 was normal.  01/27/2014 HRCT - patchy areas of very mild ground-glass attenuation and subpleural reticulation.  CHest HRCT 04/2016 >> slight worse , NSIP pattern   PFT 04/2015 >> ratio nml, FVC 61%, TLC 68%, DLCO 67%   Past Medical History:  Diagnosis Date  . Adenomatous polyp of colon 2006  . Anemia   . Arthritis    "qwhere" (06/28/2016)  . CAD S/P  percutaneous coronary angioplasty 06/2016   06/28/16 PCI with Synergy DES 2.75 mm 20 mm-->m RCA; 07/10/16 PCI with Synergy DES 3.5 mm x 20 mm-->mLAD  . Diabetes mellitus without complication ([place])    "suppose to be on Metformin but doesn't take it" (06/28/2016)  . Diverticulosis of colon (without mention of hemorrhage) 1999, 2006  . Dyspepsia   . Epiretinal membrane    "epiretinal attachment" (06/28/2016)  . Esophagitis 1999  . GERD (gastroesophageal reflux disease)   . Heart murmur   . Hiatal hernia 1999  . History of kidney stones   . HOH (hard of hearing)    "wears aids"   . Hyperlipidemia   . Hypertension   . 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 Paroxysmal atrial fibrillation ([place])    a. 11/2003 Tikosyn initiated - subsequently d/c'd;  b. 2006 RFCA for Afib @ [facility];  c. 09/2011 Echo: EF 60-65%, Gr 2 DD;  d. 10/2011 DCCV  . Pneumonia 1990s X 1  . Psoriasis 1967  . Rheumatic fever ~ 1958  . Seasonal allergies   . Skin cancer    on leg, left arm  . Sleep apnea    patient has had the sleep test, but states they said it didn't need treatment; "will repeat test at home" (06/28/2016)     Review of Systems  neg for any significant sore throat, dysphagia, itching, sneezing, nasal congestion or excess/ purulent secretions, fever, chills, sweats, unintended wt loss, pleuritic or exertional cp, hempoptysis, orthopnea pnd or change in chronic leg swelling. Also denies presyncope, palpitations, heartburn, abdominal pain, nausea, vomiting, diarrhea or change in bowel or urinary habits, dysuria,hematuria, rash, arthralgias, visual complaints, headache, numbness weakness or ataxia.     Objective:   Physical Exam   Gen. Pleasant, obese, in no distress ENT - no lesions, no post nasal drip Neck: No JVD, no thyromegaly, no carotid bruits Lungs: no use of accessory muscles, no dullness to percussion,rt basal  rales no rhonchi  Cardiovascular: Rhythm regular, heart  sounds  normal, no murmurs or gallops, no peripheral edema Musculoskeletal: No deformities, no cyanosis or clubbing , no tremors         Assessment & Plan:

## 2017-05-14 NOTE — Patient Instructions (Signed)
Chest x-ray today-based on this we will decide about doing a scan  Increase prednisone to 10 mg 2 weeks and then go back down to 5 mg. Hycodan cough syrup 5 mL bedtime as needed #120 ml  Please send me a report from rheumatologist so that we can decide about long-term prednisone

## 2017-05-16 ENCOUNTER — Telehealth: Payer: Self-pay | Admitting: Pulmonary Disease

## 2017-05-16 ENCOUNTER — Telehealth: Payer: Self-pay | Admitting: Cardiology

## 2017-05-16 ENCOUNTER — Other Ambulatory Visit: Payer: Self-pay | Admitting: Pulmonary Disease

## 2017-05-16 DIAGNOSIS — I10 Essential (primary) hypertension: Secondary | ICD-10-CM

## 2017-05-16 DIAGNOSIS — J849 Interstitial pulmonary disease, unspecified: Secondary | ICD-10-CM

## 2017-05-16 DIAGNOSIS — E785 Hyperlipidemia, unspecified: Secondary | ICD-10-CM

## 2017-05-16 NOTE — Telephone Encounter (Signed)
Mr.Cheeks is returning your call .Marland Kitchen Thanks

## 2017-05-16 NOTE — Telephone Encounter (Signed)
Returned the call to the patient for lab results. He stated that he is hesitant to switch to a new medication because he was not fasting for these labs. Message will be routed to the provider:  Notes recorded by Ledora Bottcher, PA on 05/13/2017 at 11:59 AM EST LDL is still not at goal. We will switch your pravachol to 20 mg crestor. Will need repeat fasting lipid panel and LFTs in 6-8 weeks.

## 2017-05-19 NOTE — Telephone Encounter (Signed)
Pt aware of cxr results/recs.  Nothing further needed.

## 2017-05-20 ENCOUNTER — Ambulatory Visit: Payer: Medicare Other | Admitting: Pulmonary Disease

## 2017-05-20 MED ORDER — ROSUVASTATIN CALCIUM 20 MG PO TABS: 20.0000 mg | ORAL_TABLET | Freq: Every day | ORAL | 11 refills | Status: DC

## 2017-05-20 NOTE — Telephone Encounter (Signed)
This is the second lipid panel in which is LDL and triglycerides are not at goal. I would still recommend switching to crestor. We will check repeat lipids and LFTs in 6-8 weeks.

## 2017-05-20 NOTE — Addendum Note (Signed)
Addended by: Ricci Barker on: 05/20/2017 01:43 PM   Modules accepted: Orders

## 2017-05-20 NOTE — Telephone Encounter (Signed)
Patient has been made aware discontinue the pravastatin and to start rosuvastatin 20 mg daily. He will come back in 2 months for repeat fasting labs

## 2017-05-26 ENCOUNTER — Ambulatory Visit (INDEPENDENT_AMBULATORY_CARE_PROVIDER_SITE_OTHER)
Admission: RE | Admit: 2017-05-26 | Discharge: 2017-05-26 | Disposition: A | Discharge status: Home / Self Care | Payer: Medicare Other | Source: Ambulatory Visit | Attending: Pulmonary Disease | Admitting: Pulmonary Disease

## 2017-05-26 DIAGNOSIS — J849 Interstitial pulmonary disease, unspecified: Secondary | ICD-10-CM

## 2017-05-26 DIAGNOSIS — J8410 Pulmonary fibrosis, unspecified: Secondary | ICD-10-CM | POA: Diagnosis not present

## 2017-05-30 ENCOUNTER — Telehealth: Payer: Self-pay | Admitting: Pulmonary Disease

## 2017-05-30 NOTE — Telephone Encounter (Signed)
rec'd from [facility] forwarded 12 pages to Dr. Rigoberto Noel

## 2017-06-12 ENCOUNTER — Other Ambulatory Visit: Payer: Self-pay | Admitting: Pulmonary Disease

## 2017-06-12 ENCOUNTER — Ambulatory Visit (INDEPENDENT_AMBULATORY_CARE_PROVIDER_SITE_OTHER): Payer: Medicare Other | Admitting: Pulmonary Disease

## 2017-06-12 ENCOUNTER — Encounter: Payer: Self-pay | Admitting: Pulmonary Disease

## 2017-06-12 DIAGNOSIS — J849 Interstitial pulmonary disease, unspecified: Secondary | ICD-10-CM

## 2017-06-12 DIAGNOSIS — I2510 Atherosclerotic heart disease of native coronary artery without angina pectoris: Secondary | ICD-10-CM

## 2017-06-12 DIAGNOSIS — Z9861 Coronary angioplasty status: Secondary | ICD-10-CM

## 2017-06-12 MED ORDER — AZITHROMYCIN 250 MG PO TABS: ORAL_TABLET | ORAL | 0 refills | Status: DC

## 2017-06-12 NOTE — Patient Instructions (Signed)
Stay on 5mg  prednisone Rx for Zpak sent to pharmacy

## 2017-06-12 NOTE — Progress Notes (Signed)
Subjective:    Patient ID: Kenneth [name], male    DOB: 1946/02/13, 72 y.o.   MRN: [id number]  HPI  72 yo remote smoker (cigars) with psoriasis for FU of Steroid responsive ILD, dates back to 2015, favor NSIP  He has atrial fibrillation for 15 years s/p atrial ablation 01/2014 . He takes flecainide if his heart rate gets too high.  He was on Enbrel and methotrexate weekly for 3 years , was then on Humira. He is now on cosentyx  injections  from his dermatologist He worked for a [employer] and is now retired, he works on Insurance underwriter with a Advice worker He smokes cigars quit about 20 years ago   He has been on low-dose prednisone since 07/2014.  He had a flareup in early 2019 and we increased his prednisone slightly.  He is now back down to 5 mg and seems to be back to his baseline. We reviewed HRCT results He is unable to perform PFTs    Significant tests/ events CT angiogram 09/2007 which shows bilateral groundglass opacities but a repeat CT in 10/2007 and shows resolution of these infiltrates.  01/27/2014 HRCT - patchy areas of very mild ground-glass attenuation and subpleural reticulation. CHest HRCT 04/2016 >> slight worse , NSIP pattern HRCT 05/2017 unchanged NSIP, benign nodules   PFT 04/2015 >> ratio nml, FVC 61%, TLC 68%, DLCO 67%  Past Medical History:  Diagnosis Date  . Adenomatous polyp of colon 2006  . Anemia   . Arthritis    "qwhere" (06/28/2016)  . CAD S/P percutaneous coronary angioplasty 06/2016   06/28/16 PCI with Synergy DES 2.75 mm 20 mm-->m RCA; 07/10/16 PCI with Synergy DES 3.5 mm x 20 mm-->mLAD  . Diabetes mellitus without complication ([place])    "suppose to be on Metformin but doesn't take it" (06/28/2016)  . Diverticulosis of colon (without mention of hemorrhage) 1999, 2006  . Dyspepsia   . Epiretinal membrane    "epiretinal attachment" (06/28/2016)  . Esophagitis 1999  . GERD (gastroesophageal reflux disease)   . Heart murmur   . Hiatal hernia 1999    . History of kidney stones   . HOH (hard of hearing)    "wears aids"   . Hyperlipidemia   . Hypertension   . 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 Paroxysmal atrial fibrillation ([place])    a. 11/2003 Tikosyn initiated - subsequently d/c'd;  b. 2006 RFCA for Afib @ [facility];  c. 09/2011 Echo: EF 60-65%, Gr 2 DD;  d. 10/2011 DCCV  . Pneumonia 1990s X 1  . Psoriasis 1967  . Rheumatic fever ~ 1958  . Seasonal allergies   . Skin cancer    on leg, left arm  . Sleep apnea    patient has had the sleep test, but states they said it didn't need treatment; "will repeat test at home" (06/28/2016)     Review of Systems neg for any significant sore throat, dysphagia, itching, sneezing, nasal congestion or excess/ purulent secretions, fever, chills, sweats, unintended wt loss, pleuritic or exertional cp, hempoptysis, orthopnea pnd or change in chronic leg swelling. Also denies presyncope, palpitations, heartburn, abdominal pain, nausea, vomiting, diarrhea or change in bowel or urinary habits, dysuria,hematuria, rash, arthralgias, visual complaints, headache, numbness weakness or ataxia.     Objective:   Physical Exam   Gen. Pleasant, obese, in no distress ENT - no lesions, no post nasal drip Neck: No JVD,  no thyromegaly, no carotid bruits Lungs: no use of accessory muscles, no dullness to percussion, bibasal rales no rhonchi  Cardiovascular: Rhythm regular, heart sounds  normal, no murmurs or gallops, no peripheral edema Musculoskeletal: No deformities, no cyanosis or clubbing , no tremors         Assessment & Plan:

## 2017-06-16 ENCOUNTER — Ambulatory Visit: Payer: Medicare Other | Admitting: Pulmonary Disease

## 2017-06-16 NOTE — Assessment & Plan Note (Signed)
He is back to his baseline dose of 5 mg. Imaging appears unchanged without any worsening pneumonitis.  He is unable to perform PFTs but does not seem to desaturate on exertion. We discussed immunosuppressants as alternative to chronic steroids and discussed side effects of steroids.  He prefers to stay on this dose at this time  We discussed plan for acute worsening in the future-including filling in prescription for Z-Pak and increasing prednisone to 20 mg before they call me

## 2017-07-08 ENCOUNTER — Telehealth: Payer: Self-pay

## 2017-07-08 ENCOUNTER — Telehealth: Payer: Self-pay | Admitting: Cardiology

## 2017-07-08 NOTE — Telephone Encounter (Signed)
   [facility] Pre-operative Risk Assessment    Request for surgical clearance:  1. What type of surgery is being performed? Bilateral Lower Lid Blepharoplasty   2. When is this surgery scheduled? 07/24/17  3. What type of clearance is required (medical clearance vs. Pharmacy clearance to hold med vs. Both)? Both  4. Are there any medications that need to be held prior to surgery and how long? Requesting can pt stop Plavix and Aspirin 2 weeks prior to surgery.  5. Practice name and name of physician performing surgery?  [facility]  6. What is your office phone and fax number? [phone]           Fax: [phone]  7. Anesthesia type (None, local, MAC, general) ?  MAC   Meryl Crutch 07/08/2017, 4:56 PM  _________________________________________________________________   (provider comments below)

## 2017-07-08 NOTE — Telephone Encounter (Signed)
Did not need this encounter °

## 2017-07-11 NOTE — Telephone Encounter (Addendum)
   Primary Cardiologist: [name], MD  Chart reviewed as part of pre-operative protocol coverage. Patient was contacted 07/11/2017 in reference to pre-operative risk assessment for pending surgery as outlined below.  Kenneth [name] was last seen on 05/12/2017 by Fabian Sharp, PA-C.  Since that day, Kenneth [name] has done well.  He denies chest pain or shortness of breath.  Cardiac symptoms at all.  Dr. Ellyn Hack to address if it is okay to hold the aspirin and Plavix.  His last drug-eluting stent was placed on 07/10/2016.  Dr. Ellyn Hack states that it is okay to hold the Plavix for 5-7 days  Therefore, based on ACC/AHA guidelines, the patient would be at acceptable risk for the planned procedure without further cardiovascular testing.   I will route this recommendation to the requesting party via Epic fax function and remove from pre-op pool.  Please call with questions.  Rosaria Ferries, PA-C 07/11/2017, 3:37 PM

## 2017-07-23 DIAGNOSIS — H04123 Dry eye syndrome of bilateral lacrimal glands: Secondary | ICD-10-CM | POA: Diagnosis not present

## 2017-07-23 DIAGNOSIS — H52203 Unspecified astigmatism, bilateral: Secondary | ICD-10-CM | POA: Diagnosis not present

## 2017-07-23 DIAGNOSIS — H524 Presbyopia: Secondary | ICD-10-CM | POA: Diagnosis not present

## 2017-07-23 DIAGNOSIS — E119 Type 2 diabetes mellitus without complications: Secondary | ICD-10-CM | POA: Diagnosis not present

## 2017-07-23 DIAGNOSIS — Z961 Presence of intraocular lens: Secondary | ICD-10-CM | POA: Diagnosis not present

## 2017-07-23 DIAGNOSIS — Z7984 Long term (current) use of oral hypoglycemic drugs: Secondary | ICD-10-CM | POA: Diagnosis not present

## 2017-07-23 DIAGNOSIS — H35342 Macular cyst, hole, or pseudohole, left eye: Secondary | ICD-10-CM | POA: Diagnosis not present

## 2017-07-23 DIAGNOSIS — H02832 Dermatochalasis of right lower eyelid: Secondary | ICD-10-CM | POA: Diagnosis not present

## 2017-07-23 DIAGNOSIS — H02835 Dermatochalasis of left lower eyelid: Secondary | ICD-10-CM | POA: Diagnosis not present

## 2017-07-23 DIAGNOSIS — H43393 Other vitreous opacities, bilateral: Secondary | ICD-10-CM | POA: Diagnosis not present

## 2017-07-23 DIAGNOSIS — H35373 Puckering of macula, bilateral: Secondary | ICD-10-CM | POA: Diagnosis not present

## 2017-08-23 ENCOUNTER — Other Ambulatory Visit: Payer: Self-pay

## 2017-08-23 ENCOUNTER — Emergency Department (HOSPITAL_COMMUNITY): Payer: Medicare Other

## 2017-08-23 ENCOUNTER — Encounter (HOSPITAL_COMMUNITY): Payer: Self-pay

## 2017-08-23 ENCOUNTER — Emergency Department (HOSPITAL_COMMUNITY)
Admission: EM | Admit: 2017-08-23 | Discharge: 2017-08-23 | Disposition: A | Discharge status: Home / Self Care | Payer: Medicare Other | Attending: Emergency Medicine | Admitting: Emergency Medicine

## 2017-08-23 DIAGNOSIS — Y998 Other external cause status: Secondary | ICD-10-CM | POA: Insufficient documentation

## 2017-08-23 DIAGNOSIS — W208XXA Other cause of strike by thrown, projected or falling object, initial encounter: Secondary | ICD-10-CM | POA: Insufficient documentation

## 2017-08-23 DIAGNOSIS — E119 Type 2 diabetes mellitus without complications: Secondary | ICD-10-CM | POA: Diagnosis not present

## 2017-08-23 DIAGNOSIS — Z87891 Personal history of nicotine dependence: Secondary | ICD-10-CM | POA: Insufficient documentation

## 2017-08-23 DIAGNOSIS — Y939 Activity, unspecified: Secondary | ICD-10-CM | POA: Diagnosis not present

## 2017-08-23 DIAGNOSIS — Z87820 Personal history of traumatic brain injury: Secondary | ICD-10-CM

## 2017-08-23 DIAGNOSIS — I480 Paroxysmal atrial fibrillation: Secondary | ICD-10-CM | POA: Diagnosis not present

## 2017-08-23 DIAGNOSIS — S0003XA Contusion of scalp, initial encounter: Secondary | ICD-10-CM | POA: Diagnosis not present

## 2017-08-23 DIAGNOSIS — Z7982 Long term (current) use of aspirin: Secondary | ICD-10-CM | POA: Diagnosis not present

## 2017-08-23 DIAGNOSIS — I2510 Atherosclerotic heart disease of native coronary artery without angina pectoris: Secondary | ICD-10-CM | POA: Insufficient documentation

## 2017-08-23 DIAGNOSIS — I5032 Chronic diastolic (congestive) heart failure: Secondary | ICD-10-CM | POA: Insufficient documentation

## 2017-08-23 DIAGNOSIS — Y9289 Other specified places as the place of occurrence of the external cause: Secondary | ICD-10-CM | POA: Diagnosis not present

## 2017-08-23 DIAGNOSIS — E785 Hyperlipidemia, unspecified: Secondary | ICD-10-CM | POA: Diagnosis not present

## 2017-08-23 DIAGNOSIS — S069X9A Unspecified intracranial injury with loss of consciousness of unspecified duration, initial encounter: Secondary | ICD-10-CM | POA: Diagnosis not present

## 2017-08-23 DIAGNOSIS — S060X0A Concussion without loss of consciousness, initial encounter: Secondary | ICD-10-CM | POA: Insufficient documentation

## 2017-08-23 DIAGNOSIS — Z7984 Long term (current) use of oral hypoglycemic drugs: Secondary | ICD-10-CM | POA: Insufficient documentation

## 2017-08-23 DIAGNOSIS — Z79899 Other long term (current) drug therapy: Secondary | ICD-10-CM | POA: Diagnosis not present

## 2017-08-23 DIAGNOSIS — S0990XA Unspecified injury of head, initial encounter: Secondary | ICD-10-CM | POA: Diagnosis present

## 2017-08-23 DIAGNOSIS — I110 Hypertensive heart disease with heart failure: Secondary | ICD-10-CM | POA: Diagnosis not present

## 2017-08-23 HISTORY — DX: Personal history of traumatic brain injury: Z87.820

## 2017-08-23 LAB — BASIC METABOLIC PANEL
ANION GAP: 11 (ref 5–15)
BUN: 14 mg/dL (ref 6–20)
CHLORIDE: 103 mmol/L (ref 101–111)
CO2: 25 mmol/L (ref 22–32)
Calcium: 9.6 mg/dL (ref 8.9–10.3)
Creatinine, Ser: 1.21 mg/dL (ref 0.61–1.24)
GFR calc non Af Amer: 58 mL/min — ABNORMAL LOW (ref >60)
Glucose, Bld: 135 mg/dL — ABNORMAL HIGH (ref 65–99)
POTASSIUM: 4.2 mmol/L (ref 3.5–5.1)
SODIUM: 139 mmol/L (ref 135–145)

## 2017-08-23 LAB — I-STAT TROPONIN, ED: Troponin i, poc: 0 ng/mL (ref 0.00–0.08)

## 2017-08-23 LAB — CBC
HCT: 41.4 % (ref 39.0–52.0)
HEMOGLOBIN: 13.5 g/dL (ref 13.0–17.0)
MCH: 28.2 pg (ref 26.0–34.0)
MCHC: 32.6 g/dL (ref 30.0–36.0)
MCV: 86.6 fL (ref 78.0–100.0)
Platelets: 225 10*3/uL (ref 150–400)
RBC: 4.78 MIL/uL (ref 4.22–5.81)
RDW: 13.3 % (ref 11.5–15.5)
WBC: 7.5 10*3/uL (ref 4.0–10.5)

## 2017-08-23 NOTE — ED Provider Notes (Signed)
[facility] EMERGENCY DEPARTMENT Provider Note   CSN: [id number] Arrival date & time: 08/23/17  1245     History   Chief Complaint Chief Complaint  Patient presents with  . Head Injury    HPI Kenneth [name] is a 72 y.o. male.  Patient status post head injury prior to arrival.  Patient had a steel bar on the backside of tractor that slipped and hit him in the head on the right side.  He was dazed but no loss of consciousness.  Patient is on blood thinners.  But it appears that it may be Plavix.  Patient initially without a headache.  But in the last hour and a half has developed a headache.  No other injuries no neck pain.  No visual changes.  No nausea or vomiting.     Past Medical History:  Diagnosis Date  . Adenomatous polyp of colon 2006  . Anemia   . Arthritis    "qwhere" (06/28/2016)  . CAD S/P percutaneous coronary angioplasty 06/2016   06/28/16 PCI with Synergy DES 2.75 mm 20 mm-->m RCA; 07/10/16 PCI with Synergy DES 3.5 mm x 20 mm-->mLAD  . Diabetes mellitus without complication ([place])    "suppose to be on Metformin but doesn't take it" (06/28/2016)  . Diverticulosis of colon (without mention of hemorrhage) 1999, 2006  . Dyspepsia   . Epiretinal membrane    "epiretinal attachment" (06/28/2016)  . Esophagitis 1999  . GERD (gastroesophageal reflux disease)   . Heart murmur   . Hiatal hernia 1999  . History of kidney stones   . HOH (hard of hearing)    "wears aids"   . Hyperlipidemia   . Hypertension   . 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 Paroxysmal atrial fibrillation ([place])    a. 11/2003 Tikosyn initiated - subsequently d/c'd;  b. 2006 RFCA for Afib @ [facility];  c. 09/2011 Echo: EF 60-65%, Gr 2 DD;  d. 10/2011 DCCV  . Pneumonia 1990s X 1  . Psoriasis 1967  . Rheumatic fever ~ 1958  . Seasonal allergies   . Skin cancer    on leg, left arm  . Sleep apnea    patient has had the sleep test, but states they said it  didn't need treatment; "will repeat test at home" (06/28/2016)    Patient Active Problem List   Diagnosis Date Noted  . Controlled type 2 diabetes mellitus without complication, without long-term current use of insulin ([place]) 05/12/2017  . Essential hypertension 05/12/2017  . Iron deficiency anemia 11/11/2016  . Hyperlipidemia with target low density lipoprotein (LDL) cholesterol less than 70 mg/dL 11/11/2016  . Aortic valve sclerosis 09/17/2016  . Abnormal fractional flow reserve on cardiac catheterization: LAD 07/10/2016  . Percutaneous transluminal coronary angioplasty (PTCA) within last 14 to 24 months   . CAD S/P percutaneous coronary angioplasty 06/28/2016  . Coronary artery disease involving native coronary artery with angina pectoris ([place]) 06/27/2016  . Exertional dyspnea 06/27/2016  . ILD (interstitial lung disease) ([place]) 01/27/2014  . Atypical atrial flutter ([place]) 01/13/2014  . Hx of colonic polyp 07/15/2013  . Dehydration 05/22/2013  . Diarrhea 05/22/2013  . Diastolic CHF, chronic ([place]) 05/22/2013  . Chronic anticoagulation 03/25/2013  . Atrial flutter ([place]) 10/23/2011  . OTHER CHRONIC SINUSITIS 05/31/2010  . NEPHROLITHIASIS, HX OF 02/02/2008  . PERSISTENT DISORDER INITIATING/MAINTAINING SLEEP 12/16/2007  . Obstructive sleep apnea 10/07/2007  . UNSPEC ALVEOLAR&PARIETOALVEOLAR PNEUMONOPATHY 10/02/2007  .  Allergic rhinitis 07/24/2007  . Atrial fibrillation ([place]) 07/06/2007    Past Surgical History:  Procedure Laterality Date  . APPENDECTOMY  1996  . ATRIAL FIBRILLATION ABLATION N/A 01/13/2014   repeat PVI, also left atrial ablation performed with successfull ablation of LA flutter by Dr Rayann Heman  . ATRIAL FIBRILLATION ABLATION  2006   Dr Rolland Porter at [facility]  . CARDIAC CATHETERIZATION  2005  . CARDIOVERSION N/A 05/22/2013   Procedure: CARDIOVERSION;  Surgeon: [name], MD;  Location: [facility];  Service: Cardiovascular;  Laterality: N/A;  . CARDIOVERSION N/A 10/23/2011    Procedure: CARDIOVERSION;  Surgeon: Deboraha Sprang, MD;  Location: [facility] CATH LAB;  Service: Cardiovascular;  Laterality: N/A;  . CARDIOVERSION N/A 05/01/2012   Procedure: CARDIOVERSION;  Surgeon: Deboraha Sprang, MD;  Location: [facility] CATH LAB;  Service: Cardiovascular;  Laterality: N/A;  . CARPAL TUNNEL RELEASE Right 1980s?  Marland Kitchen CATARACT EXTRACTION W/ INTRAOCULAR LENS  IMPLANT, BILATERAL Bilateral   . COLONOSCOPY    . CORONARY STENT INTERVENTION N/A 06/28/2016   Procedure: Coronary Stent Intervention;  Surgeon: Leonie Man, MD;  Location: [facility] CV LAB;  Service: Cardiovascular;  Laterality: mRCA PCI -Synergy DES 2.75 m x 20 mm (3.1 mm)  . CORONARY STENT INTERVENTION N/A 07/10/2016   Procedure: Coronary Stent Intervention;  Surgeon: Leonie Man, MD;  Location: [facility] CV LAB;  Service: Cardiovascular: mLAD PCI Synergy DES 3.5 mm x 20 mm)  . CYSTOSCOPY W/ STONE MANIPULATION  2010   Patient states he has had several kidney stones up until aroune 2010, none since (06/28/2016)  . ESOPHAGOGASTRODUODENOSCOPY    . INTRAVASCULAR PRESSURE WIRE/FFR STUDY N/A 06/28/2016   Procedure: Intravascular Pressure Wire/FFR Study;  Surgeon: Leonie Man, MD;  Location: [facility] CV LAB;  Service: Cardiovascular: FFR of mLAD ~65% lesion = 0.75 post --> Staged PCI  . NASAL SINUS SURGERY     x 2  . RIGHT/LEFT HEART CATH AND CORONARY ANGIOGRAPHY N/A 06/28/2016   Procedure: Right/Left Heart Cath and Coronary Angiography;  Surgeon: Leonie Man, MD;  Location: [facility] CV LAB;  Service: Cardiovascular: mRCA 99% (TIMI 2), mLAD ~65% (FFR 0.75), OM3 55%. mild Pulm HTN. Mod LVEDP elevation. EF 55-65%.  . SHOULDER OPEN ROTATOR CUFF REPAIR Bilateral   . TONSILLECTOMY          Home Medications    Prior to Admission medications   Medication Sig Start Date End Date Taking? Authorizing Provider  acetaminophen (TYLENOL) 500 MG tablet Take 1,000 mg by mouth every 6 (six) hours as needed (for pain.).    [provider]  aspirin 81 MG chewable tablet CHEW AND SWALLOW 1 TABLET BY MOUTH ONCE DAILY 12/05/16   Leonie Man, MD  azithromycin (ZITHROMAX) 250 MG tablet Take 2 tablets on first day, then 1 tablet daily until finished. 06/12/17   Rigoberto Noel, MD  Biotin 1000 MCG tablet Take 1,000 mcg by mouth at bedtime.    [provider]  calcipotriene (DOVONOX) 0.005 % cream Apply 1 application topically 2 (two) times daily as needed (for psoriasis.).    [provider]  cetirizine (ZYRTEC) 10 MG tablet Take 10 mg by mouth daily.    [provider]  clobetasol cream (TEMOVATE) 2.13 % Apply 1 application topically 2 (two) times daily as needed (for psoriasis).    [provider]  clopidogrel (PLAVIX) 75 MG tablet TAKE 1 TABLET BY MOUTH ONCE DAILY WITH BREAKFAST 12/05/16   Leonie Man,  MD  diltiazem (CARDIZEM CD) 180 MG 24 hr capsule Take 1 capsule (180 mg total) by mouth daily. 05/12/17   Duke, Tami Lin, PA  Flurandrenolide [facility]) 4 MCG/SQCM TAPE Apply 1 each topically at bedtime. Applied to areas affected by psoriasis.    [provider]  gabapentin (NEURONTIN) 300 MG capsule Take 300 mg by mouth at bedtime.    [provider]  HYDROcodone-homatropine (HYCODAN) 5-1.5 MG/5ML syrup Take 5 mLs by mouth every 6 (six) hours as needed for cough. 05/14/17   Rigoberto Noel, MD  metFORMIN (GLUCOPHAGE) 500 MG tablet Take by mouth 2 (two) times daily with a meal.    [provider]  nitroGLYCERIN (NITROSTAT) 0.4 MG SL tablet Place 1 tablet (0.4 mg total) under the tongue every 5 (five) minutes as needed for chest pain. 06/29/16   Bhagat, Crista Luria, PA  pantoprazole (PROTONIX) 40 MG tablet TAKE 1 TABLET BY MOUTH ONCE DAILY Patient taking differently: TAKE 1 TABLET BY MOUTH ONCE DAILY PRN 01/29/17   Leonie Man, MD  Polyethyl Glycol-Propyl Glycol (SYSTANE) 0.4-0.3 % GEL ophthalmic gel Place 1 application into both eyes at bedtime.     [provider]  predniSONE (DELTASONE) 10 MG tablet TAKE 1 TABLET BY MOUTH ONCE DAILY FOR 2 WEEKS THEN  GO  BACK  TO  5  MG  DAILY 06/13/17   Rigoberto Noel, MD  predniSONE (DELTASONE) 5 MG tablet TAKE 1 TABLET BY MOUTH ONCE DAILY WITH BREAKFAST 05/09/17   Rigoberto Noel, MD  rosuvastatin (CRESTOR) 20 MG tablet Take 1 tablet (20 mg total) by mouth daily. 05/20/17 08/18/17  Ledora Bottcher, PA  zolpidem (AMBIEN) 10 MG tablet Take 5-10 mg by mouth at bedtime as needed for sleep.    [provider]    Family History Family History  Problem Relation Age of Onset  . Coronary artery disease Mother   . Breast cancer Mother   . Colon cancer Mother   . Heart disease Maternal Grandmother        MI  . Stroke Paternal Grandfather        CVA  . Coronary artery disease Paternal Grandfather   . Esophageal cancer Neg Hx   . Stomach cancer Neg Hx   . Rectal cancer Neg Hx     Social History Social History   Tobacco Use  . Smoking status: Former Smoker    Years: 10.00    Types: Cigars    Last attempt to quit: 09/29/2001    Years since quitting: 15.9  . Smokeless tobacco: Former Systems developer    Types: Snuff, Sarina Ser    Quit date: 09/29/2001  Substance Use Topics  . Alcohol use: No  . Drug use: No     Allergies   Hydrocodone-acetaminophen; Other; Oxycodone-acetaminophen; Pseudoephedrine; and Tramadol   Review of Systems Review of Systems  Constitutional: Negative for fever.  HENT: Negative for congestion.   Eyes: Negative for visual disturbance.  Respiratory: Negative for shortness of breath.   Cardiovascular: Negative for chest pain.  Gastrointestinal: Negative for abdominal pain, nausea and vomiting.  Genitourinary: Negative for dysuria.  Musculoskeletal: Negative for back pain and neck pain.  Skin: Positive for wound.  Neurological: Positive for headaches.  Hematological: Bruises/bleeds easily.  Psychiatric/Behavioral: Negative for confusion.     Physical Exam Updated  Vital Signs BP (!) 159/89 (BP Location: Left Arm)   Pulse 79   Temp 98.2 F (36.8 C) (Oral)   Resp 17   Ht 1.905  m (6\' 3" )   Wt 113.4 kg (250 lb)   SpO2 99%   BMI 31.25 kg/m   Physical Exam  Constitutional: He is oriented to person, place, and time. He appears well-developed and well-nourished. No distress.  HENT:  Head: Normocephalic.  Right forehead scalp area with an abrasion no active bleeding hematoma measuring about 4 cm.  Eyes: Pupils are equal, round, and reactive to light. Conjunctivae and EOM are normal.  Neck: Neck supple.  No tenderness to palpation to the posterior cervical spine.  Cardiovascular: Normal rate, regular rhythm and normal heart sounds.  Pulmonary/Chest: Effort normal and breath sounds normal. No respiratory distress.  Abdominal: Soft. Bowel sounds are normal. There is no tenderness.  Musculoskeletal: Normal range of motion. He exhibits no tenderness or deformity.  Neurological: He is alert and oriented to person, place, and time. No cranial nerve deficit or sensory deficit. He exhibits normal muscle tone. Coordination normal.  Skin: Skin is warm. No pallor.  Nursing note and vitals reviewed.    ED Treatments / Results  Labs (all labs ordered are listed, but only abnormal results are displayed) Labs Reviewed  BASIC METABOLIC PANEL - Abnormal; Notable for the following components:      Result Value   Glucose, Bld 135 (*)    GFR calc non Af Amer 58 (*)    All other components within normal limits  CBC  I-STAT TROPONIN, ED    EKG None  Radiology Ct Head Wo Contrast  Result Date: 08/23/2017 CLINICAL DATA:  Impact injury no loss of consciousness. Anti coagulation medication EXAM: CT HEAD WITHOUT CONTRAST TECHNIQUE: Contiguous axial images were obtained from the base of the skull through the vertex without intravenous contrast. COMPARISON:  None. FINDINGS: Brain: No intracranial hemorrhage. No parenchymal contusion. No midline shift or mass effect.  Basilar cisterns are patent. No skull base fracture. No fluid in the paranasal sinuses or mastoid air cells. Orbits are normal. Vascular: No hyperdense vessel or unexpected calcification. Skull: No skull fracture Sinuses/Orbits: No orbital injury.  Sinuses clear Other: Scalp hematoma over the RIGHT frontotemporal bone measures 10 mm in depth (image 23/3). No associated skull fracture. IMPRESSION: 1. RIGHT frontal scalp hematoma.  No skull fracture. 2. No intracranial trauma. Electronically Signed   By: Suzy Bouchard M.D.   On: 08/23/2017 15:37    Procedures Procedures (including critical care time)  Medications Ordered in ED Medications - No data to display   Initial Impression / Assessment and Plan / ED Course  I have reviewed the triage vital signs and the nursing notes.  Pertinent labs & imaging results that were available during my care of the patient were reviewed by me and considered in my medical decision making (see chart for details).     Patient status post head injury probably has a mild concussion.  Postconcussive information provided.  The patient prefers only to take Tylenol.  CT head without any skull fracture or any intracranial injury.  Patient nontoxic no acute distress.  Patient stable for discharge home.  Final Clinical Impressions(s) / ED Diagnoses   Final diagnoses:  Injury of head, initial encounter  Contusion of scalp, initial encounter  Concussion without loss of consciousness, initial encounter    ED Discharge Orders    None       Fredia Sorrow, MD 08/23/17 [phone]

## 2017-08-23 NOTE — ED Triage Notes (Signed)
Pt states that he was hit in the head with an iron bar today while working on his tractor. Pt is on blood thinners. Pt denies LOC states he was out on his land, got in his cart and drove over to his son-in-laws. Pt A&OX4.

## 2017-08-23 NOTE — Discharge Instructions (Addendum)
Tylenol as needed for the headache.  Clinically it does appear that you have a component of concussion.  Follow-up with your doctors in clinic if not feeling better in 7 days.  Return for any new or worse symptoms.

## 2017-09-22 DIAGNOSIS — R310 Gross hematuria: Secondary | ICD-10-CM | POA: Diagnosis not present

## 2017-09-22 DIAGNOSIS — Z125 Encounter for screening for malignant neoplasm of prostate: Secondary | ICD-10-CM | POA: Diagnosis not present

## 2017-09-23 ENCOUNTER — Other Ambulatory Visit: Payer: Self-pay | Admitting: Cardiology

## 2017-09-23 ENCOUNTER — Other Ambulatory Visit: Payer: Self-pay | Admitting: Pulmonary Disease

## 2017-09-25 DIAGNOSIS — R310 Gross hematuria: Secondary | ICD-10-CM | POA: Diagnosis not present

## 2017-09-25 DIAGNOSIS — N132 Hydronephrosis with renal and ureteral calculous obstruction: Secondary | ICD-10-CM | POA: Diagnosis not present

## 2017-10-06 ENCOUNTER — Telehealth: Payer: Self-pay | Admitting: Cardiology

## 2017-10-06 NOTE — Telephone Encounter (Signed)
New Message:    Pt passing blood in his urine,sometimes it is real heavy. Pt is on Plavix and Aspirin.

## 2017-10-06 NOTE — Telephone Encounter (Signed)
Spoke with pt wife, the patient has had blood in his urine since June that maybe related to a kidney stone. He is on both aspirin and plavix. The wife is asking if he would be able to stop the aspirin to see if that would help with the bleeding. Will forward to dr harding to review and advise.

## 2017-10-06 NOTE — Telephone Encounter (Signed)
Yes.  Okay to stop aspirin if bleeding does not stop, would be okay to stop Plavix for 3 to 5 days until it stops.  Wait a day or 2 without bleeding and then restart without aspirin.  But would recommend getting this checked by urology.  [name], MD

## 2017-10-07 NOTE — Telephone Encounter (Signed)
Spoke to patient. Verbalized understanding of instructions. Patient states he has an appointment with urologist on Wednesday of this week.

## 2017-10-10 ENCOUNTER — Other Ambulatory Visit: Payer: Self-pay | Admitting: Urology

## 2017-10-10 ENCOUNTER — Telehealth: Payer: Self-pay | Admitting: Cardiology

## 2017-10-10 DIAGNOSIS — N202 Calculus of kidney with calculus of ureter: Secondary | ICD-10-CM | POA: Diagnosis not present

## 2017-10-10 DIAGNOSIS — R3915 Urgency of urination: Secondary | ICD-10-CM | POA: Diagnosis not present

## 2017-10-10 DIAGNOSIS — R310 Gross hematuria: Secondary | ICD-10-CM | POA: Diagnosis not present

## 2017-10-10 NOTE — Telephone Encounter (Signed)
Will ask Dr Ellyn Hack to comment on Plavix then contact the patient.  Kerin Ransom PA-C 10/10/2017 4:00 PM

## 2017-10-10 NOTE — Telephone Encounter (Signed)
° °  [facility] Pre-operative Risk Assessment    Request for surgical clearance:  1. What type of surgery is being performed? Kidneystone   2. When is this surgery scheduled? 7.11.19  3. What type of clearance is required (medical clearance vs. Pharmacy clearance to hold med vs. Both)? Pharmacy  4. Are there any medications that need to be held prior to surgery and how long?Plavix  5days  5. Practice name and name of physician performing surgery? [facility]/Dr Jeffie Pollock   6. What is your office phone number [phone] ext [phone]   7.   What is your office fax number  [phone]   8.   Anesthesia type (None, local, MAC, general) ? General   Kenneth [name] 10/10/2017, 3:44 PM  _________________________________________________________________   (provider comments below)

## 2017-10-13 NOTE — Telephone Encounter (Signed)
   Primary Cardiologist: [name], MD  Chart reviewed and patient interviewed as part of pre-operative protocol coverage. Given past medical history and time since last visit, based on ACC/AHA guidelines, MELODY SAVIDGE would be at acceptable risk for the planned procedure without further cardiovascular testing.   OK to hold Plavix 5 days prior to surgery.  I will route this recommendation to the requesting party via Epic fax function and remove from pre-op pool.  Please call with questions.  Kerin Ransom, PA-C 10/13/2017, 1:40 PM

## 2017-10-14 ENCOUNTER — Encounter (HOSPITAL_BASED_OUTPATIENT_CLINIC_OR_DEPARTMENT_OTHER): Payer: Self-pay | Admitting: *Deleted

## 2017-10-14 ENCOUNTER — Other Ambulatory Visit: Payer: Self-pay

## 2017-10-14 NOTE — Progress Notes (Signed)
Spoke w/ pt and wife via phone for pre-op interview.  Npo after mn.  Arrive at 0530.  Needs istat.  Current ekg and chest CT in chart and epic.  Will take zyrtec, prednisone, protonix, and gabapentin am dos w/ sips of water.

## 2017-10-15 NOTE — H&P (Signed)
CC: I have blood in my urine.  HPI: Kenneth [name] is a 72 year-old male established patient who is here for blood in the urine.  He did see the blood in his urine. He first noticed the symptoms approximately 09/17/2017. He has seen blood clots.   He does not have a burning sensation when he urinates. He is not currently having trouble urinating.   He is having pain. He has recently had unwanted weight loss.   His last U/S or CT Scan was approximately 09/07/2007.   Jhase returns today in f/u. His CT showed a right proximal sotne and small bilateral renal stones. No other GU findings were noted. His culture and cytology were negative. His UA today has persistent hematuria.   A year ago he was noted to have low Iron and he was given daily iron which has taken care of the Iron. He has had gross hematuria for about a year according to his wife's assessment of the toilet water in the morning. He saw obvious blood on 6/12. he went to the [place] and they did blood work and a culture. That culture was negative and the blood work was ok. He then came in and saw Jimmey Ralph who ordered the CT. He continues to have some hematuria intermittently. His on plavix and ASA. He has had some left low back pain but the stone on CT was in the right proximal ureter about 31mm in size. He had cardiac stents in 3/18 and 4/18. He hasn't held the plavix but he stopped the ASA.   Weight loss over 12 months ~ 25 lbs. Has hx of kidney stones with last stone ~ 10 yrs ago     CC: AUA Questions Scoring.  HPI:     AUA Symptom Score: 50% of the time he has the sensation of not emptying his bladder completely when finished urinating. 50% of the time he has to urinate again fewer than two hours after he has finished urinating. He does not have to stop and start again several times when he urinates. 50% of the time he finds it difficult to postpone urination. 50% of the time he has a weak urinary stream. He never has to push or strain  to begin urination. He has to get up to urinate 3 times from the time he goes to bed until the time he gets up in the morning.   Calculated AUA Symptom Score: 15    ALLERGIES: Decongestant TABS - Other Reaction, Tachycardia    MEDICATIONS: Metformin Hcl 500 mg tablet 1 tablet PO Daily  Tamsulosin Hcl 0.4 mg capsule 1 capsule PO Daily  Acetaminophen 500 mg tablet 1 tablet PO Daily  Acitretin 10 mg capsule  Aspir 81 1 PO Daily  Biotin 1,000 mcg tablet,chewable 1 tablet PO Daily  Calcipotriene 0.005 % ointment 1 PO Daily  Cetirizine Hcl 10 mg tablet 1 tablet PO Daily  Clobetasol Propionate 0.05 % cream 1 PO Daily  Clopidogrel 75 mg tablet 1 tablet PO Daily  Cordran 4 mcg/cm2 tape, medicated 1 PO Daily  Diltiazem 24Hr Cd 180 mg capsule, ext release 24 hr 1 capsule PO Daily  Ferrous Sulfate 325 mg (65 mg iron) tablet 1 tablet PO Daily  Gabapentin 300 mg capsule 1 capsule PO Daily  Meloxicam 15 mg tablet 1 tablet PO Daily  Hardin Negus' Colon Health 1 PO Daily  Prednisone 5 mg tablet 1 tablet PO Daily  Rosuvastatin Calcium 20 mg tablet 1 tablet PO Daily  Sildenafil  Citrate 100 mg tablet 1 tablet PO Daily  Zolpidem Tartrate 10 mg tablet 1 tablet PO Daily     GU PSH: Cysto Uretero Lithotripsy - 2009 Cystoscopy Insert Stent - 2009 Locm 300-399Mg /Ml Iodine,1Ml - 09/25/2017 Ureteroscopic stone removal - 2009      PSH Notes: Cardiac Catheter His Ablation     NON-GU PSH: Appendectomy - 2009 Cardiac Stent Placement - about 09/06/2016 Cataract surgery Sinus Surgery..    GU PMH: Gross hematuria (Acute), Will proceed with hematuria workup with CT and cysto. Will check PSA today. Culture urine and send for cytology. Will empirically begin Cephalexin 500 mg 1 po BID X 7 days. - 09/22/2017 BPH w/LUTS, Benign prostatic hyperplasia with urinary obstruction - 2014 ED due to arterial insufficiency, Erectile dysfunction due to arterial insufficiency - 2014 Flank Pain, Generalized abdominal pain -  2014 History of urolithiasis, Nephrolithiasis - 2014 Other microscopic hematuria, Microscopic hematuria - 2014 Renal calculus, Kidney stone on right side - 2014 Testicular atrophy, Testicular atrophy - 2014 Ureteral calculus, Distal Ureteral Stone On The Right - 2014, Mid Ureteral Stone On The Left, - 2014, Distal Ureteral Stone On The Right, - 2014, Calculus of distal right ureter, - 2014, Calculus of ureter, - 2014 Urinary Urgency, Urinary urgency - 2014      PMH Notes: heart murmur   NON-GU PMH: Asthma, Asthma - 2014 Cardiac murmur, unspecified, Murmurs - 2014 Unspecified atrial fibrillation, Atrial Fibrillation - 2014 Arrhythmia Arthritis Atrial Fibrillation Coronary Artery Disease Diabetes Type 2 Encounter for general adult medical examination without abnormal findings, Encounter for preventive health examination Hypercholesterolemia Hypertension    FAMILY HISTORY: Breast Cancer - Mother Colon Cancer - Mother Dementia - Mother, Father Kidney Stones - Mother Prostate Cancer - Brother    Notes: Mother deceased at age 27 from dementia  Father deceased at 73 from dementia   SOCIAL HISTORY: Marital Status: Married Preferred Language: English; Ethnicity: Not Hispanic Or Latino; Race: White Current Smoking Status: Patient has never smoked.   Tobacco Use Assessment Completed: Used Tobacco in last 30 days? Does not use smokeless tobacco. Drinks 1 drink per year.  Does not use drugs. Drinks 1 caffeinated drink per day. Patient's occupation is/was Retired.    REVIEW OF SYSTEMS:    GU Review Male:   Patient denies frequent urination, hard to postpone urination, burning/ pain with urination, get up at night to urinate, leakage of urine, stream starts and stops, trouble starting your stream, have to strain to urinate , erection problems, and penile pain.  Gastrointestinal (Upper):   Patient denies nausea, vomiting, and indigestion/ heartburn.  Gastrointestinal (Lower):   He has  some suprapubic soreness. Patient denies diarrhea and constipation.  Constitutional:   Patient denies fever, night sweats, weight loss, and fatigue.  Skin:   Patient denies skin rash/ lesion and itching.  Eyes:   Patient denies blurred vision and double vision.  Ears/ Nose/ Throat:   Patient denies sore throat and sinus problems.  Hematologic/Lymphatic:   Patient denies swollen glands and easy bruising.  Cardiovascular:   Patient denies leg swelling and chest pains.  Respiratory:   Patient denies cough and shortness of breath.  Endocrine:   Patient denies excessive thirst.  Musculoskeletal:   Patient reports back pain. Patient denies joint pain.  Neurological:   Patient denies headaches and dizziness.  Psychologic:   Patient denies depression and anxiety.   VITAL SIGNS:      10/10/2017 01:43 PM  BP 160/75 mmHg  Pulse 83 /min  Temperature 97.5 F / 36.3 C   MULTI-SYSTEM PHYSICAL EXAMINATION:    Constitutional: Obese. No physical deformities. Normally developed. Good grooming.   Neck: Neck symmetrical, not swollen. Normal tracheal position.   Respiratory: No labored breathing, no use of accessory muscles. Normal breath sounds.  Cardiovascular: Regular rate and rhythm. No murmur, no gallop.   Lymphatic: No enlargement, no tenderness of axillae, supraclavicular, neck lymph nodes.  Skin: No paleness, no jaundice, no cyanosis. No lesion, no ulcer, no rash.  Neurologic / Psychiatric: Oriented to time, oriented to place, oriented to person. No depression, no anxiety, no agitation.  Gastrointestinal: Abdominal tenderness that is mild in the lower abdomen, obese. No mass, no rigidity.   Musculoskeletal: Normal gait and station of head and neck.     PAST DATA REVIEWED:  Source Of History:  Patient  Urine Test Review:   Urinalysis  X-Ray Review: KUB: Reviewed Films. Discussed With Patient.  C.T. Hematuria: Reviewed Films. Reviewed Report. Discussed With Patient. 2mm right proximal stone without  obstruction and small bilateral renal stones with the largest 75mm in the RUP. He has coronary and aortic calcific atherosclerosis.Marland Kitchen     09/22/17 08/29/09  PSA  Total PSA 1.95 ng/mL 1.69     10/20/07  Hormones  Testosterone, Total 2.33     PROCEDURES:         KUB - 74018  A single view of the abdomen is obtained. KUB doesn't clearly show the right proximal stone but there is a faint shadow over the L3 transverse process on the right. No other significant bone, gas or soft tissue findings are noted. He does have some pelvic vascular calcifications.                 PVR Ultrasound - 33825  Scanned Volume: 0 cc         Urinalysis w/Scope Dipstick Dipstick Cont'd Micro  Color: Yellow Bilirubin: Neg WBC/hpf: 0 - 5/hpf  Appearance: Clear Ketones: Neg RBC/hpf: 20 - 40/hpf  Specific Gravity: 1.025 Blood: 3+ Bacteria: NS (Not Seen)  pH: 6.0 Protein: Trace Cystals: NS (Not Seen)  Glucose: Neg Urobilinogen: 0.2 Casts: NS (Not Seen)    Nitrites: Neg Trichomonas: Not Present    Leukocyte Esterase: Neg Mucous: Not Present      Epithelial Cells: 0 - 5/hpf      Yeast: NS (Not Seen)      Sperm: Not Present    ASSESSMENT:      ICD-10 Details  1 GU:   Gross hematuria - R31.0 He continues to have hemturia that is probably secondary to the right proximal ureteral stone and antiplatelet therapy. He will hold the plavix and continue aspirin until the bleeding stops. I will get him set up for cystoscopy with right ureteroscopy with stone extraction. I have reviewed the risks of ureteroscopy including bleeding, infection, ureteral injury, need for a stent or secondary procedures, thrombotic events and anesthetic complications.   2   Urinary Urgency - R39.15 The urgency is likely from the hematuria. I didn't see a stone close the bladder today  3   Renal calculus - N20.0 Stable  4   Ureteral calculus - N20.1 Stable   PLAN:           Orders X-Rays: KUB          Schedule Return Visit/Planned  Activity: Next Available Appointment - Schedule Surgery

## 2017-10-16 ENCOUNTER — Ambulatory Visit (HOSPITAL_BASED_OUTPATIENT_CLINIC_OR_DEPARTMENT_OTHER)
Admission: RE | Admit: 2017-10-16 | Discharge: 2017-10-16 | Disposition: A | Discharge status: Home / Self Care | Payer: Medicare Other | Source: Ambulatory Visit | Attending: Urology | Admitting: Urology

## 2017-10-16 ENCOUNTER — Ambulatory Visit (HOSPITAL_BASED_OUTPATIENT_CLINIC_OR_DEPARTMENT_OTHER): Payer: Medicare Other | Admitting: Anesthesiology

## 2017-10-16 ENCOUNTER — Encounter (HOSPITAL_BASED_OUTPATIENT_CLINIC_OR_DEPARTMENT_OTHER): Payer: Self-pay

## 2017-10-16 ENCOUNTER — Encounter (HOSPITAL_BASED_OUTPATIENT_CLINIC_OR_DEPARTMENT_OTHER)
Admission: RE | Disposition: A | Discharge status: Home / Self Care | Payer: Self-pay | Source: Ambulatory Visit | Attending: Urology

## 2017-10-16 DIAGNOSIS — I25810 Atherosclerosis of coronary artery bypass graft(s) without angina pectoris: Secondary | ICD-10-CM | POA: Diagnosis not present

## 2017-10-16 DIAGNOSIS — I2510 Atherosclerotic heart disease of native coronary artery without angina pectoris: Secondary | ICD-10-CM | POA: Insufficient documentation

## 2017-10-16 DIAGNOSIS — I4891 Unspecified atrial fibrillation: Secondary | ICD-10-CM | POA: Diagnosis not present

## 2017-10-16 DIAGNOSIS — Z79899 Other long term (current) drug therapy: Secondary | ICD-10-CM | POA: Insufficient documentation

## 2017-10-16 DIAGNOSIS — E7800 Pure hypercholesterolemia, unspecified: Secondary | ICD-10-CM | POA: Insufficient documentation

## 2017-10-16 DIAGNOSIS — N202 Calculus of kidney with calculus of ureter: Secondary | ICD-10-CM | POA: Insufficient documentation

## 2017-10-16 DIAGNOSIS — Z7982 Long term (current) use of aspirin: Secondary | ICD-10-CM | POA: Insufficient documentation

## 2017-10-16 DIAGNOSIS — I10 Essential (primary) hypertension: Secondary | ICD-10-CM | POA: Diagnosis not present

## 2017-10-16 DIAGNOSIS — N201 Calculus of ureter: Secondary | ICD-10-CM | POA: Diagnosis not present

## 2017-10-16 DIAGNOSIS — E119 Type 2 diabetes mellitus without complications: Secondary | ICD-10-CM | POA: Diagnosis not present

## 2017-10-16 DIAGNOSIS — Z87891 Personal history of nicotine dependence: Secondary | ICD-10-CM | POA: Insufficient documentation

## 2017-10-16 DIAGNOSIS — N200 Calculus of kidney: Secondary | ICD-10-CM | POA: Diagnosis not present

## 2017-10-16 DIAGNOSIS — G4733 Obstructive sleep apnea (adult) (pediatric): Secondary | ICD-10-CM | POA: Diagnosis not present

## 2017-10-16 DIAGNOSIS — Z7984 Long term (current) use of oral hypoglycemic drugs: Secondary | ICD-10-CM | POA: Insufficient documentation

## 2017-10-16 HISTORY — DX: Frequency of micturition: R35.0

## 2017-10-16 HISTORY — DX: Personal history of other diseases of the circulatory system: Z86.79

## 2017-10-16 HISTORY — DX: Calculus of ureter: N20.1

## 2017-10-16 HISTORY — PX: CYSTOSCOPY WITH RETROGRADE PYELOGRAM, URETEROSCOPY AND STENT PLACEMENT: SHX5789

## 2017-10-16 HISTORY — DX: Presence of coronary angioplasty implant and graft: Z95.5

## 2017-10-16 HISTORY — DX: Personal history of colon polyps: Z86.010

## 2017-10-16 HISTORY — DX: Personal history of adenomatous and serrated colon polyps: Z86.0101

## 2017-10-16 HISTORY — DX: Iron deficiency anemia, unspecified: D50.9

## 2017-10-16 HISTORY — DX: Interstitial pulmonary disease, unspecified: J84.9

## 2017-10-16 HISTORY — DX: Presence of external hearing-aid: Z97.4

## 2017-10-16 HISTORY — DX: Dyspnea, unspecified: R06.00

## 2017-10-16 HISTORY — DX: Personal history of traumatic brain injury: Z87.820

## 2017-10-16 HISTORY — DX: Other forms of dyspnea: R06.09

## 2017-10-16 HISTORY — DX: Urgency of urination: R39.15

## 2017-10-16 HISTORY — DX: Type 2 diabetes mellitus without complications: E11.9

## 2017-10-16 HISTORY — DX: Diverticulosis of large intestine without perforation or abscess without bleeding: K57.30

## 2017-10-16 HISTORY — DX: Personal history of other malignant neoplasm of skin: Z85.828

## 2017-10-16 LAB — GLUCOSE, CAPILLARY: GLUCOSE-CAPILLARY: 141 mg/dL — AB (ref 70–99)

## 2017-10-16 LAB — POCT I-STAT 4, (NA,K, GLUC, HGB,HCT)
Glucose, Bld: 137 mg/dL — ABNORMAL HIGH (ref 70–99)
HEMATOCRIT: 36 % — AB (ref 39.0–52.0)
HEMOGLOBIN: 12.2 g/dL — AB (ref 13.0–17.0)
Potassium: 3.7 mmol/L (ref 3.5–5.1)
Sodium: 141 mmol/L (ref 135–145)

## 2017-10-16 SURGERY — CYSTOURETEROSCOPY, WITH RETROGRADE PYELOGRAM AND STENT INSERTION
Anesthesia: General | Laterality: Right

## 2017-10-16 MED ORDER — FENTANYL CITRATE (PF) 100 MCG/2ML IJ SOLN
25.0000 ug | INTRAMUSCULAR | Status: DC | PRN
  Filled 2017-10-16: qty 1

## 2017-10-16 MED ORDER — PHENAZOPYRIDINE HCL 200 MG PO TABS
200.0000 mg | ORAL_TABLET | Freq: Three times a day (TID) | ORAL | Status: DC
  Administered 2017-10-16: 200 mg via ORAL
  Filled 2017-10-16: qty 1

## 2017-10-16 MED ORDER — ONDANSETRON HCL 4 MG/2ML IJ SOLN
INTRAMUSCULAR | Status: DC | PRN
  Administered 2017-10-16: 4 mg via INTRAVENOUS

## 2017-10-16 MED ORDER — MIDAZOLAM HCL 2 MG/2ML IJ SOLN
INTRAMUSCULAR | Status: AC
  Filled 2017-10-16: qty 2

## 2017-10-16 MED ORDER — CEFAZOLIN SODIUM-DEXTROSE 2-4 GM/100ML-% IV SOLN
2.0000 g | INTRAVENOUS | Status: AC
  Administered 2017-10-16: 2 g via INTRAVENOUS
  Filled 2017-10-16: qty 100

## 2017-10-16 MED ORDER — KETOROLAC TROMETHAMINE 30 MG/ML IJ SOLN
15.0000 mg | Freq: Once | INTRAMUSCULAR | Status: DC | PRN
  Filled 2017-10-16: qty 1

## 2017-10-16 MED ORDER — IOHEXOL 300 MG/ML  SOLN
INTRAMUSCULAR | Status: DC | PRN
  Administered 2017-10-16: 10 mL via URETHRAL

## 2017-10-16 MED ORDER — LIDOCAINE 2% (20 MG/ML) 5 ML SYRINGE
INTRAMUSCULAR | Status: AC
Start: 2017-10-16 — End: ?
  Filled 2017-10-16: qty 5

## 2017-10-16 MED ORDER — ONDANSETRON HCL 4 MG/2ML IJ SOLN
INTRAMUSCULAR | Status: AC
  Filled 2017-10-16: qty 2

## 2017-10-16 MED ORDER — DEXAMETHASONE SODIUM PHOSPHATE 10 MG/ML IJ SOLN
INTRAMUSCULAR | Status: AC
  Filled 2017-10-16: qty 1

## 2017-10-16 MED ORDER — DEXAMETHASONE SODIUM PHOSPHATE 10 MG/ML IJ SOLN
INTRAMUSCULAR | Status: DC | PRN
  Administered 2017-10-16: 10 mg via INTRAVENOUS

## 2017-10-16 MED ORDER — FENTANYL CITRATE (PF) 100 MCG/2ML IJ SOLN
INTRAMUSCULAR | Status: DC | PRN
  Administered 2017-10-16: 50 ug via INTRAVENOUS

## 2017-10-16 MED ORDER — LACTATED RINGERS IV SOLN
INTRAVENOUS | Status: DC
  Administered 2017-10-16: 06:00:00 via INTRAVENOUS
  Filled 2017-10-16: qty 1000

## 2017-10-16 MED ORDER — HYDROMORPHONE HCL 2 MG PO TABS
2.0000 mg | ORAL_TABLET | ORAL | Status: DC | PRN
  Filled 2017-10-16: qty 1

## 2017-10-16 MED ORDER — SODIUM CHLORIDE 0.9 % IR SOLN
Status: DC | PRN
  Administered 2017-10-16: 3000 mL via INTRAVESICAL

## 2017-10-16 MED ORDER — ACETAMINOPHEN 500 MG PO TABS
1000.0000 mg | ORAL_TABLET | Freq: Four times a day (QID) | ORAL | Status: DC | PRN
  Administered 2017-10-16: 1000 mg via ORAL
  Filled 2017-10-16: qty 2

## 2017-10-16 MED ORDER — PHENAZOPYRIDINE HCL 100 MG PO TABS
ORAL_TABLET | ORAL | Status: AC
  Filled 2017-10-16: qty 2

## 2017-10-16 MED ORDER — PROMETHAZINE HCL 25 MG/ML IJ SOLN
6.2500 mg | INTRAMUSCULAR | Status: DC | PRN
  Filled 2017-10-16: qty 1

## 2017-10-16 MED ORDER — HYDROMORPHONE HCL 2 MG PO TABS: 2.0000 mg | ORAL_TABLET | Freq: Two times a day (BID) | ORAL | 0 refills | Status: DC | PRN

## 2017-10-16 MED ORDER — FENTANYL CITRATE (PF) 100 MCG/2ML IJ SOLN
INTRAMUSCULAR | Status: AC
  Filled 2017-10-16: qty 2

## 2017-10-16 MED ORDER — CEFAZOLIN SODIUM-DEXTROSE 2-4 GM/100ML-% IV SOLN
INTRAVENOUS | Status: AC
  Filled 2017-10-16: qty 100

## 2017-10-16 MED ORDER — ACETAMINOPHEN 500 MG PO TABS
ORAL_TABLET | ORAL | Status: AC
  Filled 2017-10-16: qty 2

## 2017-10-16 MED ORDER — PROPOFOL 10 MG/ML IV BOLUS
INTRAVENOUS | Status: AC
  Filled 2017-10-16: qty 20

## 2017-10-16 MED ORDER — MIDAZOLAM HCL 5 MG/5ML IJ SOLN
INTRAMUSCULAR | Status: DC | PRN
  Administered 2017-10-16: 2 mg via INTRAVENOUS

## 2017-10-16 MED ORDER — PROPOFOL 10 MG/ML IV BOLUS
INTRAVENOUS | Status: DC | PRN
  Administered 2017-10-16: 150 mg via INTRAVENOUS

## 2017-10-16 MED ORDER — LIDOCAINE 2% (20 MG/ML) 5 ML SYRINGE
INTRAMUSCULAR | Status: DC | PRN
  Administered 2017-10-16: 40 mg via INTRAVENOUS

## 2017-10-16 SURGICAL SUPPLY — 27 items
BAG DRAIN URO-CYSTO SKYTR STRL (DRAIN) ×3 IMPLANT
BAG DRN UROCATH (DRAIN) ×1
BASKET STONE 1.7 NGAGE (UROLOGICAL SUPPLIES) ×2 IMPLANT
BASKET ZERO TIP NITINOL 2.4FR (BASKET) IMPLANT
BSKT STON RTRVL ZERO TP 2.4FR (BASKET)
CATH URET 5FR 28IN CONE TIP (BALLOONS)
CATH URET 5FR 28IN OPEN ENDED (CATHETERS) IMPLANT
CATH URET 5FR 70CM CONE TIP (BALLOONS) IMPLANT
CLOTH BEACON ORANGE TIMEOUT ST (SAFETY) ×3 IMPLANT
ELECT REM PT RETURN 9FT ADLT (ELECTROSURGICAL)
ELECTRODE REM PT RTRN 9FT ADLT (ELECTROSURGICAL) IMPLANT
FIBER LASER FLEXIVA 365 (UROLOGICAL SUPPLIES) IMPLANT
FIBER LASER TRAC TIP (UROLOGICAL SUPPLIES) IMPLANT
GLOVE SURG SS PI 8.0 STRL IVOR (GLOVE) ×3 IMPLANT
GOWN STRL REUS W/TWL XL LVL3 (GOWN DISPOSABLE) ×3 IMPLANT
GUIDEWIRE 0.038 PTFE COATED (WIRE) IMPLANT
GUIDEWIRE ANG ZIPWIRE 038X150 (WIRE) IMPLANT
GUIDEWIRE STR DUAL SENSOR (WIRE) ×3 IMPLANT
INFUSOR MANOMETER BAG 3000ML (MISCELLANEOUS) ×3 IMPLANT
IV NS IRRIG 3000ML ARTHROMATIC (IV SOLUTION) ×3 IMPLANT
KIT TURNOVER CYSTO (KITS) ×3 IMPLANT
MANIFOLD NEPTUNE II (INSTRUMENTS) ×3 IMPLANT
NS IRRIG 500ML POUR BTL (IV SOLUTION) ×3 IMPLANT
PACK CYSTO (CUSTOM PROCEDURE TRAY) ×3 IMPLANT
TUBE CONNECTING 12'X1/4 (SUCTIONS)
TUBE CONNECTING 12X1/4 (SUCTIONS) IMPLANT
TUBING UROLOGY SET (TUBING) IMPLANT

## 2017-10-16 NOTE — Anesthesia Preprocedure Evaluation (Signed)
Anesthesia Evaluation  Patient identified by MRN, date of birth, ID band Patient awake    Reviewed: Allergy & Precautions, NPO status , Patient's Chart, lab work & pertinent test results  Airway Mallampati: II  TM Distance: >3 FB Neck ROM: Full    Dental no notable dental hx.    Pulmonary neg pulmonary ROS, former smoker,    Pulmonary exam normal breath sounds clear to auscultation       Cardiovascular hypertension, + CAD, + Cardiac Stents and +CHF  Normal cardiovascular exam+ dysrhythmias Atrial Fibrillation  Rhythm:Regular Rate:Normal     Neuro/Psych negative neurological ROS  negative psych ROS   GI/Hepatic Neg liver ROS, GERD  ,  Endo/Other  diabetes  Renal/GU negative Renal ROS  negative genitourinary   Musculoskeletal negative musculoskeletal ROS (+)   Abdominal   Peds negative pediatric ROS (+)  Hematology negative hematology ROS (+)   Anesthesia Other Findings   Reproductive/Obstetrics negative OB ROS                             Anesthesia Physical Anesthesia Plan  ASA: III  Anesthesia Plan: General   Post-op Pain Management:    Induction: Intravenous  PONV Risk Score and Plan: 2 and Ondansetron, Treatment may vary due to age or medical condition and Dexamethasone  Airway Management Planned: LMA  Additional Equipment:   Intra-op Plan:   Post-operative Plan: Extubation in OR  Informed Consent: I have reviewed the patients History and Physical, chart, labs and discussed the procedure including the risks, benefits and alternatives for the proposed anesthesia with the patient or authorized representative who has indicated his/her understanding and acceptance.   Dental advisory given  Plan Discussed with: CRNA and Surgeon  Anesthesia Plan Comments:         Anesthesia Quick Evaluation

## 2017-10-16 NOTE — Discharge Instructions (Addendum)
CYSTOSCOPY HOME CARE INSTRUCTIONS ° °Activity: °Rest for the remainder of the day.  Do not drive or operate equipment today.  You may resume normal activities in one to two days as instructed by your physician.  ° °Meals: °Drink plenty of liquids and eat light foods such as gelatin or soup this evening.  You may return to a normal meal plan tomorrow. ° °Return to Work: °You may return to work in one to two days or as instructed by your physician. ° °Special Instructions / Symptoms: °Call your physician if any of these symptoms occur: ° ° -persistent or heavy bleeding ° -bleeding which continues after first few urination ° -large blood clots that are difficult to pass ° -urine stream diminishes or stops completely ° -fever equal to or higher than 101 degrees Farenheit. ° -cloudy urine with a strong, foul odor ° -severe pain ° You may feel some burning pain when you urinate.  This should disappear with time.  Applying moist heat to the lower abdomen or a hot tub bath may help relieve the pain.  ° ° ° ° °Post Anesthesia Home Care Instructions ° °Activity: °Get plenty of rest for the remainder of the day. A responsible individual must stay with you for 24 hours following the procedure.  °For the next 24 hours, DO NOT: °-Drive a car °-Operate machinery °-Drink alcoholic beverages °-Take any medication unless instructed by your physician °-Make any legal decisions or sign important papers. ° °Meals: °Start with liquid foods such as gelatin or soup. Progress to regular foods as tolerated. Avoid greasy, spicy, heavy foods. If nausea and/or vomiting occur, drink only clear liquids until the nausea and/or vomiting subsides. Call your physician if vomiting continues. ° °Special Instructions/Symptoms: °Your throat may feel dry or sore from the anesthesia or the breathing tube placed in your throat during surgery. If this causes discomfort, gargle with warm salt water. The discomfort should disappear within 24 hours. ° °If you  had a scopolamine patch placed behind your ear for the management of post- operative nausea and/or vomiting: ° °1. The medication in the patch is effective for 72 hours, after which it should be removed.  Wrap patch in a tissue and discard in the trash. Wash hands thoroughly with soap and water. °2. You may remove the patch earlier than 72 hours if you experience unpleasant side effects which may include dry mouth, dizziness or visual disturbances. °3. Avoid touching the patch. Wash your hands with soap and water after contact with the patch. °   ° °

## 2017-10-16 NOTE — Addendum Note (Signed)
Addendum  created 10/16/17 0951 by Verlin Grills, CRNA   Charge Capture section accepted

## 2017-10-16 NOTE — Addendum Note (Signed)
Addendum  created 10/16/17 0919 by Justice Rocher, CRNA   Charge Capture section accepted

## 2017-10-16 NOTE — Transfer of Care (Signed)
Immediate Anesthesia Transfer of Care Note  Patient: Kenneth [name]  Procedure(s) Performed: CYSTOSCOPY WITH RIGHT RETROGRADE RIGHT URETEROSCOPY AND STONE BASKET EXTRACTION (Right )  Patient Location: PACU  Anesthesia Type:General  Level of Consciousness: drowsy and patient cooperative  Airway & Oxygen Therapy: Patient Spontanous Breathing and Patient connected to nasal cannula oxygen  Post-op Assessment: Report given to RN and Post -op Vital signs reviewed and stable  Post vital signs: Reviewed and stable  Last Vitals:  Vitals Value Taken Time  BP 149/74 10/16/2017  8:11 AM  Temp 36.4 C 10/16/2017  8:10 AM  Pulse 71 10/16/2017  8:14 AM  Resp 17 10/16/2017  8:14 AM  SpO2 96 % 10/16/2017  8:14 AM  Vitals shown include unvalidated device data.  Last Pain:  Vitals:   10/16/17 0810  TempSrc:   PainSc: Asleep      Patients Stated Pain Goal: 5 (46/28/63 8177)  Complications: No apparent anesthesia complications

## 2017-10-16 NOTE — Anesthesia Procedure Notes (Signed)
Procedure Name: LMA Insertion Date/Time: 10/16/2017 7:32 AM Performed by: Verlin Grills, CRNA Pre-anesthesia Checklist: Patient identified, Emergency Drugs available, Suction available and Patient being monitored Patient Re-evaluated:Patient Re-evaluated prior to induction Oxygen Delivery Method: Circle system utilized Preoxygenation: Pre-oxygenation with 100% oxygen Induction Type: IV induction Ventilation: Mask ventilation without difficulty LMA: LMA inserted LMA Size: 4.0 Number of attempts: 1 Placement Confirmation: positive ETCO2 and breath sounds checked- equal and bilateral Dental Injury: Teeth and Oropharynx as per pre-operative assessment

## 2017-10-16 NOTE — Interval H&P Note (Signed)
History and Physical Interval Note:  10/16/2017 7:18 AM  Kenneth [name]  has presented today for surgery, with the diagnosis of RIGHT URETERAL STONE  The various methods of treatment have been discussed with the patient and family. After consideration of risks, benefits and other options for treatment, the patient has consented to  Procedure(s): CYSTOSCOPY WITH RIGHT RETROGRADE RIGHT URETEROSCOPY WIHT LASER AND STENT PLACEMENT (Right) HOLMIUM LASER APPLICATION (Right) as a surgical intervention .  The patient's history has been reviewed, patient examined, no change in status, stable for surgery.  I have reviewed the patient's chart and labs.  Questions were answered to the patient's satisfaction.     Irine Seal

## 2017-10-16 NOTE — Anesthesia Postprocedure Evaluation (Signed)
Anesthesia Post Note  Patient: Kenneth [name]  Procedure(s) Performed: CYSTOSCOPY WITH RIGHT RETROGRADE RIGHT URETEROSCOPY AND STONE BASKET EXTRACTION (Right )     Patient location during evaluation: PACU Anesthesia Type: General Level of consciousness: awake and alert Pain management: pain level controlled Vital Signs Assessment: post-procedure vital signs reviewed and stable Respiratory status: spontaneous breathing, nonlabored ventilation, respiratory function stable and patient connected to nasal cannula oxygen Cardiovascular status: blood pressure returned to baseline and stable Postop Assessment: no apparent nausea or vomiting Anesthetic complications: no    Last Vitals:  Vitals:   10/16/17 0830 10/16/17 0845  BP:  (!) 165/72  Pulse: 69 63  Resp: 19 17  Temp:    SpO2: 99% 100%    Last Pain:  Vitals:   10/16/17 0850  TempSrc:   PainSc: 0-No pain                 Estuardo Frisbee S

## 2017-10-16 NOTE — Op Note (Signed)
Procedure: 1.  Cystoscopy with right retrograde pyelogram and interpretation. 2.  Right ureteroscopic stone extraction from right mid ureter and right kidney.  Preop diagnosis: Hematuria with right ureteral stone and right renal stone.  Postop diagnosis: Same.  Surgeon: Dr. Irine Seal.  Anesthesia: General.  Specimen: Stones.  Drain: None.  EBL: None.  Complications: None.  Indications: [name] is a 72 year old white male who initially presented with hematuria.  He was found on CT to have a proximal ureteral stone on the right and also a right lower pole stone.  After reviewing the options he is elected to undergo ureteroscopy.  Procedure: He was taken operating room where he was given 2 g of Ancef.  General anesthetic was induced.  He was placed in lithotomy position and fitted with PAS hose.  His perineum and genitalia were prepped with Betadine solution and draped in usual sterile fashion.  Cystoscopy was performed using a 23 [place] scope and 30 degree lens.  Examination revealed a normal urethra, and intact external sphincter and a 2 cm prostatic urethra with mild bilobar hyperplasia but no significant obstruction.  The bladder wall had mild trabeculation without mucosal lesions.  There was some stone grit in the base of the bladder.  Ureteral orifices were unremarkable effluxing clear urine.  The right ureteral orifice was cannulated with a 5 French opening catheter and contrast was instilled.  Right retrograde pyelogram revealed a small filling defect in the lower mid ureter consistent with the stone there was minimal obstruction proximal stone.  The 6.5 French semirigid ureteroscope was passed per urethra and was able to easily access the right ureteral orifice.  I was able to advance it just below the stone but not beyond.  A sensor guidewire was passed through the scope beyond the stone and this aided passage to the level of the stone.  I then inserted the ureteroscope  alongside the wire and can get up to and above the stone but could not visualize it sufficiently to engage it with a basket.  At this point I changed to the 8 [place] digital flexible scope and advanced that alongside the wire without difficulty.  I was able to get that to the level of the stone which was removed with an engage basket.  I then advanced the ureteroscope to the kidney and retrieve the lower pole stone which was adherent to Randall plaque.  A final pass of the ureteroscope was made to retrieve a small residual fragment.  Inspection of the ureter after removal of the stones revealed no significant mucosal injury and it was felt that a stent was not required.  Ureteroscope was removed.  The cystoscope was reinserted over the sensor wire and the wire was removed.  The bladder was drained and then refilled.  There appeared to be no active bleeding and it was felt once again that a stent was not indicated.  He was then taken down from lithotomy position, his anesthetic was reversed and he was moved to recovery in stable condition.  There were no complications.  The stone fragments were given to the patient's family to bring to the office for analysis.

## 2017-10-17 ENCOUNTER — Encounter (HOSPITAL_BASED_OUTPATIENT_CLINIC_OR_DEPARTMENT_OTHER): Payer: Self-pay | Admitting: Urology

## 2017-10-24 DIAGNOSIS — R310 Gross hematuria: Secondary | ICD-10-CM | POA: Diagnosis not present

## 2017-10-24 DIAGNOSIS — N200 Calculus of kidney: Secondary | ICD-10-CM | POA: Diagnosis not present

## 2017-10-27 ENCOUNTER — Inpatient Hospital Stay (HOSPITAL_COMMUNITY)
Admission: RE | Admit: 2017-10-27 | Discharge: 2017-10-29 | DRG: 661 | Disposition: A | Discharge status: Home / Self Care | Payer: Medicare Other | Source: Other Acute Inpatient Hospital | Attending: Urology | Admitting: Urology

## 2017-10-27 ENCOUNTER — Ambulatory Visit (HOSPITAL_COMMUNITY): Payer: Medicare Other | Admitting: Anesthesiology

## 2017-10-27 ENCOUNTER — Encounter (HOSPITAL_COMMUNITY)
Admission: RE | Disposition: A | Discharge status: Home / Self Care | Payer: Self-pay | Source: Other Acute Inpatient Hospital | Attending: Urology

## 2017-10-27 ENCOUNTER — Other Ambulatory Visit: Payer: Self-pay | Admitting: Urology

## 2017-10-27 ENCOUNTER — Encounter (HOSPITAL_COMMUNITY): Payer: Self-pay | Admitting: *Deleted

## 2017-10-27 ENCOUNTER — Ambulatory Visit (HOSPITAL_COMMUNITY): Payer: Medicare Other

## 2017-10-27 DIAGNOSIS — R5082 Postprocedural fever: Secondary | ICD-10-CM | POA: Diagnosis not present

## 2017-10-27 DIAGNOSIS — R310 Gross hematuria: Secondary | ICD-10-CM | POA: Diagnosis present

## 2017-10-27 DIAGNOSIS — K449 Diaphragmatic hernia without obstruction or gangrene: Secondary | ICD-10-CM | POA: Diagnosis present

## 2017-10-27 DIAGNOSIS — I480 Paroxysmal atrial fibrillation: Secondary | ICD-10-CM | POA: Diagnosis present

## 2017-10-27 DIAGNOSIS — D72829 Elevated white blood cell count, unspecified: Secondary | ICD-10-CM | POA: Diagnosis not present

## 2017-10-27 DIAGNOSIS — N3289 Other specified disorders of bladder: Secondary | ICD-10-CM | POA: Diagnosis present

## 2017-10-27 DIAGNOSIS — I2510 Atherosclerotic heart disease of native coronary artery without angina pectoris: Secondary | ICD-10-CM | POA: Diagnosis not present

## 2017-10-27 DIAGNOSIS — Z87442 Personal history of urinary calculi: Secondary | ICD-10-CM

## 2017-10-27 DIAGNOSIS — E1165 Type 2 diabetes mellitus with hyperglycemia: Secondary | ICD-10-CM | POA: Diagnosis not present

## 2017-10-27 DIAGNOSIS — Z7902 Long term (current) use of antithrombotics/antiplatelets: Secondary | ICD-10-CM

## 2017-10-27 DIAGNOSIS — E785 Hyperlipidemia, unspecified: Secondary | ICD-10-CM | POA: Diagnosis not present

## 2017-10-27 DIAGNOSIS — E7800 Pure hypercholesterolemia, unspecified: Secondary | ICD-10-CM | POA: Diagnosis present

## 2017-10-27 DIAGNOSIS — Z79899 Other long term (current) drug therapy: Secondary | ICD-10-CM

## 2017-10-27 DIAGNOSIS — K219 Gastro-esophageal reflux disease without esophagitis: Secondary | ICD-10-CM | POA: Diagnosis not present

## 2017-10-27 DIAGNOSIS — N201 Calculus of ureter: Secondary | ICD-10-CM | POA: Diagnosis present

## 2017-10-27 DIAGNOSIS — Z955 Presence of coronary angioplasty implant and graft: Secondary | ICD-10-CM

## 2017-10-27 DIAGNOSIS — R011 Cardiac murmur, unspecified: Secondary | ICD-10-CM | POA: Diagnosis present

## 2017-10-27 DIAGNOSIS — I4891 Unspecified atrial fibrillation: Secondary | ICD-10-CM | POA: Diagnosis not present

## 2017-10-27 DIAGNOSIS — R1084 Generalized abdominal pain: Secondary | ICD-10-CM | POA: Diagnosis not present

## 2017-10-27 DIAGNOSIS — I10 Essential (primary) hypertension: Secondary | ICD-10-CM | POA: Diagnosis not present

## 2017-10-27 DIAGNOSIS — N200 Calculus of kidney: Secondary | ICD-10-CM | POA: Diagnosis not present

## 2017-10-27 DIAGNOSIS — N202 Calculus of kidney with calculus of ureter: Secondary | ICD-10-CM | POA: Diagnosis not present

## 2017-10-27 DIAGNOSIS — Z7982 Long term (current) use of aspirin: Secondary | ICD-10-CM

## 2017-10-27 DIAGNOSIS — N132 Hydronephrosis with renal and ureteral calculous obstruction: Secondary | ICD-10-CM | POA: Diagnosis not present

## 2017-10-27 HISTORY — PX: CYSTOSCOPY/RETROGRADE/URETEROSCOPY/STONE EXTRACTION WITH BASKET: SHX5317

## 2017-10-27 HISTORY — PX: HOLMIUM LASER APPLICATION: SHX5852

## 2017-10-27 LAB — BASIC METABOLIC PANEL
Anion gap: 13 (ref 5–15)
BUN: 13 mg/dL (ref 8–23)
CALCIUM: 9.3 mg/dL (ref 8.9–10.3)
CO2: 25 mmol/L (ref 22–32)
CREATININE: 0.92 mg/dL (ref 0.61–1.24)
Chloride: 99 mmol/L (ref 98–111)
GFR calc Af Amer: >60 mL/min (ref >60)
GLUCOSE: 167 mg/dL — AB (ref 70–99)
Potassium: 3.9 mmol/L (ref 3.5–5.1)
Sodium: 137 mmol/L (ref 135–145)

## 2017-10-27 LAB — LACTIC ACID, PLASMA
Lactic Acid, Venous: 2.1 mmol/L (ref 0.5–1.9)
Lactic Acid, Venous: 1.4 mmol/L (ref 0.5–1.9)

## 2017-10-27 LAB — CBC
HCT: 38.6 % — ABNORMAL LOW (ref 39.0–52.0)
HEMOGLOBIN: 13 g/dL (ref 13.0–17.0)
MCH: 29.3 pg (ref 26.0–34.0)
MCHC: 33.7 g/dL (ref 30.0–36.0)
MCV: 87.1 fL (ref 78.0–100.0)
PLATELETS: 184 10*3/uL (ref 150–400)
RBC: 4.43 MIL/uL (ref 4.22–5.81)
RDW: 13.5 % (ref 11.5–15.5)
WBC: 16.1 10*3/uL — ABNORMAL HIGH (ref 4.0–10.5)

## 2017-10-27 LAB — GLUCOSE, CAPILLARY
GLUCOSE-CAPILLARY: 172 mg/dL — AB (ref 70–99)
Glucose-Capillary: 153 mg/dL — ABNORMAL HIGH (ref 70–99)

## 2017-10-27 SURGERY — CYSTOSCOPY, WITH CALCULUS REMOVAL USING BASKET
Anesthesia: General | Site: Ureter | Laterality: Right

## 2017-10-27 MED ORDER — BISACODYL 10 MG RE SUPP: 10.0000 mg | Freq: Every day | RECTAL | Status: DC | PRN

## 2017-10-27 MED ORDER — HYDROMORPHONE HCL 1 MG/ML IJ SOLN: 0.5000 mg | INTRAMUSCULAR | Status: DC | PRN

## 2017-10-27 MED ORDER — ONDANSETRON HCL 4 MG/2ML IJ SOLN: 4.0000 mg | INTRAMUSCULAR | Status: DC | PRN

## 2017-10-27 MED ORDER — ZOLPIDEM TARTRATE 5 MG PO TABS: 5.0000 mg | ORAL_TABLET | Freq: Every evening | ORAL | Status: DC | PRN

## 2017-10-27 MED ORDER — FENTANYL CITRATE (PF) 100 MCG/2ML IJ SOLN
INTRAMUSCULAR | Status: AC
  Filled 2017-10-27: qty 2

## 2017-10-27 MED ORDER — LIDOCAINE 2% (20 MG/ML) 5 ML SYRINGE
INTRAMUSCULAR | Status: DC | PRN
  Administered 2017-10-27 (×2): 50 mg via INTRAVENOUS

## 2017-10-27 MED ORDER — BELLADONNA ALKALOIDS-OPIUM 16.2-60 MG RE SUPP
RECTAL | Status: AC
  Filled 2017-10-27: qty 1

## 2017-10-27 MED ORDER — SENNOSIDES-DOCUSATE SODIUM 8.6-50 MG PO TABS: 1.0000 | ORAL_TABLET | Freq: Every evening | ORAL | Status: DC | PRN

## 2017-10-27 MED ORDER — KCL IN DEXTROSE-NACL 20-5-0.45 MEQ/L-%-% IV SOLN
INTRAVENOUS | Status: DC
  Administered 2017-10-27: 21:00:00 via INTRAVENOUS
  Filled 2017-10-27 (×2): qty 1000

## 2017-10-27 MED ORDER — ONDANSETRON HCL 4 MG/2ML IJ SOLN
INTRAMUSCULAR | Status: AC
  Filled 2017-10-27: qty 2

## 2017-10-27 MED ORDER — BELLADONNA ALKALOIDS-OPIUM 16.2-60 MG RE SUPP
1.0000 | Freq: Once | RECTAL | Status: AC
  Administered 2017-10-27: 1 via RECTAL

## 2017-10-27 MED ORDER — FENTANYL CITRATE (PF) 100 MCG/2ML IJ SOLN
INTRAMUSCULAR | Status: DC | PRN
  Administered 2017-10-27 (×2): 50 ug via INTRAVENOUS

## 2017-10-27 MED ORDER — DIPHENHYDRAMINE HCL 12.5 MG/5ML PO ELIX: 12.5000 mg | ORAL_SOLUTION | Freq: Four times a day (QID) | ORAL | Status: DC | PRN

## 2017-10-27 MED ORDER — GENTAMICIN SULFATE 40 MG/ML IJ SOLN
480.0000 mg | INTRAVENOUS | Status: DC
  Administered 2017-10-27 – 2017-10-28 (×2): 480 mg via INTRAVENOUS
  Filled 2017-10-27 (×5): qty 12

## 2017-10-27 MED ORDER — 0.9 % SODIUM CHLORIDE (POUR BTL) OPTIME
TOPICAL | Status: DC | PRN
  Administered 2017-10-27: 1000 mL

## 2017-10-27 MED ORDER — FLEET ENEMA 7-19 GM/118ML RE ENEM: 1.0000 | ENEMA | Freq: Once | RECTAL | Status: DC | PRN

## 2017-10-27 MED ORDER — SODIUM CHLORIDE 0.9 % IR SOLN
Status: DC | PRN
  Administered 2017-10-27: 4000 mL via INTRAVESICAL

## 2017-10-27 MED ORDER — CEFAZOLIN SODIUM-DEXTROSE 2-4 GM/100ML-% IV SOLN: 2.0000 g | INTRAVENOUS | Status: DC

## 2017-10-27 MED ORDER — SODIUM CHLORIDE 0.9 % IV SOLN
1.0000 g | INTRAVENOUS | Status: DC
  Administered 2017-10-28 – 2017-10-29 (×2): 1 g via INTRAVENOUS
  Filled 2017-10-27 (×2): qty 1

## 2017-10-27 MED ORDER — ONDANSETRON HCL 4 MG/2ML IJ SOLN: 4.0000 mg | Freq: Once | INTRAMUSCULAR | Status: DC | PRN

## 2017-10-27 MED ORDER — ACETAMINOPHEN 10 MG/ML IV SOLN
INTRAVENOUS | Status: DC | PRN
  Administered 2017-10-27: 1000 mg via INTRAVENOUS

## 2017-10-27 MED ORDER — DIPHENHYDRAMINE HCL 50 MG/ML IJ SOLN: 12.5000 mg | Freq: Four times a day (QID) | INTRAMUSCULAR | Status: DC | PRN

## 2017-10-27 MED ORDER — CEFAZOLIN SODIUM-DEXTROSE 2-4 GM/100ML-% IV SOLN
2.0000 g | INTRAVENOUS | Status: AC
  Administered 2017-10-27: 2 g via INTRAVENOUS
  Filled 2017-10-27: qty 100

## 2017-10-27 MED ORDER — ACETAMINOPHEN 10 MG/ML IV SOLN
INTRAVENOUS | Status: AC
  Filled 2017-10-27: qty 100

## 2017-10-27 MED ORDER — FENTANYL CITRATE (PF) 100 MCG/2ML IJ SOLN: 50.0000 ug | Freq: Once | INTRAMUSCULAR | Status: DC

## 2017-10-27 MED ORDER — PROPOFOL 10 MG/ML IV BOLUS
INTRAVENOUS | Status: AC
  Filled 2017-10-27: qty 20

## 2017-10-27 MED ORDER — DEXAMETHASONE SODIUM PHOSPHATE 10 MG/ML IJ SOLN
INTRAMUSCULAR | Status: AC
  Filled 2017-10-27: qty 1

## 2017-10-27 MED ORDER — LACTATED RINGERS IV SOLN
INTRAVENOUS | Status: DC
  Administered 2017-10-27: 15:00:00 via INTRAVENOUS

## 2017-10-27 MED ORDER — LIDOCAINE HCL URETHRAL/MUCOSAL 2 % EX GEL
CUTANEOUS | Status: AC
  Filled 2017-10-27: qty 5

## 2017-10-27 MED ORDER — HYOSCYAMINE SULFATE 0.125 MG SL SUBL
0.1250 mg | SUBLINGUAL_TABLET | SUBLINGUAL | Status: DC | PRN
  Administered 2017-10-28 – 2017-10-29 (×4): 0.125 mg via SUBLINGUAL
  Filled 2017-10-27 (×8): qty 1

## 2017-10-27 MED ORDER — ONDANSETRON HCL 4 MG/2ML IJ SOLN
INTRAMUSCULAR | Status: DC | PRN
  Administered 2017-10-27: 4 mg via INTRAVENOUS

## 2017-10-27 MED ORDER — ACETAMINOPHEN 325 MG PO TABS
650.0000 mg | ORAL_TABLET | ORAL | Status: DC | PRN
  Administered 2017-10-28 (×2): 650 mg via ORAL
  Filled 2017-10-27 (×2): qty 2

## 2017-10-27 MED ORDER — DEXAMETHASONE SODIUM PHOSPHATE 10 MG/ML IJ SOLN
INTRAMUSCULAR | Status: DC | PRN
  Administered 2017-10-27: 10 mg via INTRAVENOUS

## 2017-10-27 MED ORDER — SODIUM CHLORIDE 0.9 % IV SOLN
INTRAVENOUS | Status: DC | PRN
  Administered 2017-10-27: 10 mL

## 2017-10-27 MED ORDER — FENTANYL CITRATE (PF) 100 MCG/2ML IJ SOLN: 25.0000 ug | INTRAMUSCULAR | Status: DC | PRN

## 2017-10-27 MED ORDER — MIDAZOLAM HCL 2 MG/2ML IJ SOLN
INTRAMUSCULAR | Status: AC
  Filled 2017-10-27: qty 2

## 2017-10-27 MED ORDER — LIDOCAINE 2% (20 MG/ML) 5 ML SYRINGE
INTRAMUSCULAR | Status: AC
  Filled 2017-10-27: qty 5

## 2017-10-27 MED ORDER — PROPOFOL 10 MG/ML IV BOLUS
INTRAVENOUS | Status: DC | PRN
  Administered 2017-10-27: 125 mg via INTRAVENOUS
  Administered 2017-10-27: 25 mg via INTRAVENOUS
  Administered 2017-10-27: 50 mg via INTRAVENOUS

## 2017-10-27 MED ORDER — FENTANYL CITRATE (PF) 100 MCG/2ML IJ SOLN
INTRAMUSCULAR | Status: AC
  Administered 2017-10-27: 50 ug
  Filled 2017-10-27: qty 2

## 2017-10-27 SURGICAL SUPPLY — 25 items
BAG URO CATCHER STRL LF (MISCELLANEOUS) ×4 IMPLANT
BASKET STONE NCOMPASS (UROLOGICAL SUPPLIES) IMPLANT
CATH URET 5FR 28IN OPEN ENDED (CATHETERS) IMPLANT
CATH URET DUAL LUMEN 6-10FR 50 (CATHETERS) ×4 IMPLANT
CLOTH BEACON ORANGE TIMEOUT ST (SAFETY) ×4 IMPLANT
COVER FOOTSWITCH UNIV (MISCELLANEOUS) IMPLANT
COVER SURGICAL LIGHT HANDLE (MISCELLANEOUS) ×4 IMPLANT
EXTRACTOR STONE NITINOL NGAGE (UROLOGICAL SUPPLIES) ×6 IMPLANT
FIBER LASER FLEXIVA 1000 (UROLOGICAL SUPPLIES) IMPLANT
FIBER LASER FLEXIVA 365 (UROLOGICAL SUPPLIES) IMPLANT
FIBER LASER FLEXIVA 550 (UROLOGICAL SUPPLIES) IMPLANT
FIBER LASER TRAC TIP (UROLOGICAL SUPPLIES) ×2 IMPLANT
GLOVE SURG SS PI 8.0 STRL IVOR (GLOVE) IMPLANT
GOWN STRL REUS W/TWL XL LVL3 (GOWN DISPOSABLE) ×4 IMPLANT
GUIDEWIRE STR DUAL SENSOR (WIRE) ×4 IMPLANT
IV NS 1000ML (IV SOLUTION) ×4
IV NS 1000ML BAXH (IV SOLUTION) ×2 IMPLANT
IV NS IRRIG 3000ML ARTHROMATIC (IV SOLUTION) ×4 IMPLANT
MANIFOLD NEPTUNE II (INSTRUMENTS) ×4 IMPLANT
PACK CYSTO (CUSTOM PROCEDURE TRAY) ×4 IMPLANT
SHEATH URETERAL 12FRX35CM (MISCELLANEOUS) ×4 IMPLANT
STENT URET 6FRX26 CONTOUR (STENTS) ×2 IMPLANT
TUBING CONNECTING 10 (TUBING) ×3 IMPLANT
TUBING CONNECTING 10' (TUBING) ×1
TUBING UROLOGY SET (TUBING) ×4 IMPLANT

## 2017-10-27 NOTE — Op Note (Signed)
Procedure: 1.  Cystoscopy with right retrograde pyelogram and interpretation. 2.  Right ureteroscopic stone extraction with holmium laser application and insertion of right double-J stent.  Preop diagnosis: Obstructing right mid ureteral stones and right renal stones.  Postop diagnosis: Same.  Surgeon: Dr. Irine Seal.  Anesthesia: General.  Specimen: Stones.  Drains: 6 French by 26 cm right contour double-J stent with tether.  EBL: Minimal.  Complications: None.  Indications: Kenneth [name] is a 72 year old white male who had a right  ureteroscopic stone extraction on 7/11 were both a proximal ureteral and renal stones were removed.  He presented to the office today with approximately 12-hour period of flank pain and urinary urgency and a repeat CT scan demonstrated that he had 2 new stones in the right lower mid ureter with fever stones in the kidney.  He was he was very uncomfortable despite pain medications and elected to undergo repeat ureteroscopy.  Procedure: He did receive Rocephin in the office earlier today and was given Ancef.  He was taken operating room where general anesthetic was induced and he was placed in lithotomy position.  He was fitted with PAS hose.  His perineum and genitalia were prepped with Betadine solution he was draped in usual sterile fashion.  Cystoscopy was performed with a 23 [place] scope and 30 degree lens.  Examination revealed a normal urethra.  The prostatic urethra had bilobar hyperplasia with minimal obstruction.  Examination of bladder revealed mild trabeculation.  Left ureteral orifice was normal.  The right ureteral orifice had some postoperative erythema and edema.  No stones or tumors were seen in the bladder.  The urine was somewhat bloody.  After initial cystoscopy a 5 French open and catheter was passed into the distal ureter on the right and contrast was instilled.  The retrograde ureterogram demonstrated a normal caliber ureter up to a filling defect  in the lower mid ureter consistent with the stone seen on CT.  There was proximal hydronephrosis.  A sensor guidewire was then passed by the stones to the kidney and the cystoscope was removed.  The 6.5 French dual-lumen semirigid ureteroscope was then passed alongside the wire and advanced to the level of the stones.  The first time was grasped with an engage basket but was too large to remove intact.  The stone was then engaged with a 200 m laser fiber with holmium laser set on 0.5 W and 10 Hz.  The stone was partially fragmented and the large fragment the remaining engaged in the basket was removed.  I then retrieved to additional stones, 1 of which was a fragment from the first stone and then a second stone were removed with the basket.  The semirigid ureteroscope was removed and a 35 cm 12/14 digital access sheath was passed over the wire and advanced into the proximal ureter.  The wire and inner core were removed and the dual-lumen digital flexible ureteroscope was advanced through the sheath to the kidney with fluoroscopy being used to aid positioning within the kidney.  An additional stone was identified in the mid pole calyx and was removed with the engage basket and was approximately 3 mm in size.  The remaining calyces were inspected and several Randall's plaques were identified but no other intraluminal stones.  The sensor wire was then passed through the scope to the kidney and the scope was backed out under visual guidance inspecting the ureter upon removal.  No significant ureteral injury was identified.  Once the scope was removed  along with the sheath the cystoscope was reinserted over the wire.  A 6 French 26 cm contour double-J stent with tether was passed to the kidney under fluoroscopic guidance.  Wire was removed leaving a good coil in the kidney and a good coil in the bladder.  The bladder was drained and the cystoscope was removed leaving the stent string exiting the urethra.  The  string was secured to the patient's penis.  The anesthetic was reversed, he was taken down from lithotomy position and moved to recovery in stable condition.  There were no complications.

## 2017-10-27 NOTE — Anesthesia Procedure Notes (Signed)
Procedure Name: LMA Insertion Performed by: Pansie Guggisberg D, CRNA Pre-anesthesia Checklist: Patient identified, Emergency Drugs available, Suction available and Patient being monitored Patient Re-evaluated:Patient Re-evaluated prior to induction Oxygen Delivery Method: Circle system utilized Preoxygenation: Pre-oxygenation with 100% oxygen Induction Type: IV induction Ventilation: Mask ventilation without difficulty LMA: LMA inserted LMA Size: 5.0 Tube type: Oral Number of attempts: 1 Placement Confirmation: positive ETCO2 and breath sounds checked- equal and bilateral Tube secured with: Tape Dental Injury: Teeth and Oropharynx as per pre-operative assessment

## 2017-10-27 NOTE — Anesthesia Preprocedure Evaluation (Addendum)
Anesthesia Evaluation  Patient identified by MRN, date of birth, ID band Patient awake    Reviewed: Allergy & Precautions, NPO status , Patient's Chart, lab work & pertinent test results  Airway Mallampati: II  TM Distance: >3 FB Neck ROM: Full    Dental  (+) Teeth Intact, Dental Advisory Given   Pulmonary shortness of breath, sleep apnea , former smoker,  ILD   Pulmonary exam normal breath sounds clear to auscultation       Cardiovascular hypertension, Pt. on medications + angina + CAD, + Cardiac Stents and +CHF  Normal cardiovascular exam+ dysrhythmias  Rhythm:Regular Rate:Normal     Neuro/Psych negative neurological ROS  negative psych ROS   GI/Hepatic Neg liver ROS, hiatal hernia, GERD  Medicated,  Endo/Other  diabetes, Type 2, Oral Hypoglycemic AgentsObesity   Renal/GU negative Renal ROS     Musculoskeletal  (+) Arthritis ,   Abdominal   Peds  Hematology  (+) Blood dyscrasia (PLavix), anemia ,   Anesthesia Other Findings Day of surgery medications reviewed with the patient.  Reproductive/Obstetrics                            Anesthesia Physical Anesthesia Plan  ASA: III  Anesthesia Plan: General   Post-op Pain Management:    Induction: Intravenous  PONV Risk Score and Plan: 2 and Dexamethasone and Ondansetron  Airway Management Planned: LMA  Additional Equipment:   Intra-op Plan:   Post-operative Plan: Extubation in OR  Informed Consent: I have reviewed the patients History and Physical, chart, labs and discussed the procedure including the risks, benefits and alternatives for the proposed anesthesia with the patient or authorized representative who has indicated his/her understanding and acceptance.   Dental advisory given  Plan Discussed with: CRNA  Anesthesia Plan Comments:         Anesthesia Quick Evaluation

## 2017-10-27 NOTE — Progress Notes (Signed)
He has a temp to 99.9 in PACU.     I am going to admit him for observation and broaden his antibiotic coverage with Gentamycin and send blood cultures and a lactate.  He has a culture pending in the office and got Rocephin earlier today.

## 2017-10-27 NOTE — H&P (Signed)
CC: I have had kidney stone surgery.  HPI: Kenneth [name] is a 72 year-old male established patient who is here for renal calculi after a surgical intervention.  He underwent right URS for a right proximal ureteral calculi as well as a lower pole stone on the same side. The RLP calculi was found to be adherent to a Randall's plaque but easily removed. The ureteral calculi was also removed as well. A stent was not left in place due to minimal irritation of the urothelium. He presents today for follow-up. His last stone event was approximately 10 years ago. He presented initially with gross hematuria and urinary urgency. CT imaging revealed the right sided stones prompting the above mentioned procedure.   Patient has had continued lower back and abdominal pain described bilaterally with ongoing increased urgency, weak dribbling stream, and intermittent gross hematuria. He states only having to take 3 pain pills over the last week and none in several days. He denies fevers. He reports going back to work the day after his procedure and traveling to [place] going back and forth to meetings all week. Symptoms have been improving over the past 24 hours and he feels the best this morning that he has felt in the past several days. Denies fevers. He has resumed his anticoagulation therapy.    10/27/17: He returns today after having development of increased urinary frequency and urgency with painful urination and weak dribbling stream. Complaining of generalized pain and body aches but specifically mentions left lower back and flank pain. Denies fevers but states feeling warm. Symptoms also associated with nausea/vomiting. He appears uncomfortable today with mild to moderate degree of severity.   The stone was on the right side. He had Ureteroscopy for treatment of his renal calculi. Patient denies Stent, ESWL, and PCNL. This procedure was done 10/16/2017. He did not pass a stone since the last office visit. This is  not his first kidney stone. He does not have a stent in place.   He is currently having flank pain, back pain, nausea, vomiting, fever, and chills. He denies having groin pain.   He does have dysuria. He does have urgency. He does have frequency.     ALLERGIES: Decongestant TABS - Other Reaction, Tachycardia    MEDICATIONS: Metformin Hcl 500 mg tablet 1 tablet PO Daily  Acetaminophen 500 mg tablet 1 tablet PO Daily  Acitretin 10 mg capsule  Aspir 81 1 PO Daily  Biotin 1,000 mcg tablet,chewable 1 tablet PO Daily  Calcipotriene 0.005 % ointment 1 PO Daily  Cetirizine Hcl 10 mg tablet 1 tablet PO Daily  Clobetasol Propionate 0.05 % cream 1 PO Daily  Clopidogrel 75 mg tablet 1 tablet PO Daily  Cordran 4 mcg/cm2 tape, medicated 1 PO Daily  Diltiazem 24Hr Er (Cd) 180 mg capsule, ext release 24 hr 1 capsule PO Daily  Ferrous Sulfate 325 mg (65 mg iron) tablet 1 tablet PO Daily  Gabapentin 300 mg capsule 1 capsule PO Daily  Meloxicam 15 mg tablet 1 tablet PO Daily  Hardin Negus' Colon Health 1 PO Daily  Prednisone 5 mg tablet 1 tablet PO Daily  Rosuvastatin Calcium 20 mg tablet 1 tablet PO Daily  Sildenafil Citrate 100 mg tablet 1 tablet PO Daily  Zolpidem Tartrate 10 mg tablet 1 tablet PO Daily     GU PSH: Cysto Uretero Lithotripsy - 2009 Cystoscopy Insert Stent - 2009 Locm 300-399Mg /Ml Iodine,1Ml - 09/25/2017 Ureteroscopic stone removal - 2009      PSH Notes: Cardiac Catheter  His Ablation     NON-GU PSH: Appendectomy - 2009 Cardiac Stent Placement - about 09/06/2016 Cataract surgery Sinus Surgery..    GU PMH: Gross hematuria, He continues to have hemturia that is probably secondary to the right proximal ureteral stone and antiplatelet therapy. He will hold the plavix and continue aspirin until the bleeding stops. I will get him set up for cystoscopy with right ureteroscopy with stone extraction. I have reviewed the risks of ureteroscopy including bleeding, infection, ureteral injury,  need for a stent or secondary procedures, thrombotic events and anesthetic complications. - 10/10/2017, (Acute), Will proceed with hematuria workup with CT and cysto. Will check PSA today. Culture urine and send for cytology. Will empirically begin Cephalexin 500 mg 1 po BID X 7 days. , - 09/22/2017 Renal calculus (Stable) - 10/10/2017, Kidney stone on right side, - 2014 Ureteral calculus (Stable) - 10/10/2017, Calculus of distal right ureter, - 2014, Mid Ureteral Stone On The Left, - 2014, Distal Ureteral Stone On The Right, - 2014, Distal Ureteral Stone On The Right, - 2014, Calculus of ureter, - 2014 BPH w/LUTS, Benign prostatic hyperplasia with urinary obstruction - 2014 ED due to arterial insufficiency, Erectile dysfunction due to arterial insufficiency - 2014 Flank Pain, Generalized abdominal pain - 2014 History of urolithiasis, Nephrolithiasis - 2014 Other microscopic hematuria, Microscopic hematuria - 2014 Testicular atrophy, Testicular atrophy - 2014 Urinary Urgency, Urinary urgency - 2014      PMH Notes: heart murmur   NON-GU PMH: Asthma, Asthma - 2014 Cardiac murmur, unspecified, Murmurs - 2014 Unspecified atrial fibrillation, Atrial Fibrillation - 2014 Arrhythmia Arthritis Atrial Fibrillation Coronary Artery Disease Diabetes Type 2 Encounter for general adult medical examination without abnormal findings, Encounter for preventive health examination Hypercholesterolemia Hypertension    FAMILY HISTORY: Breast Cancer - Mother Colon Cancer - Mother Dementia - Mother, Father Kidney Stones - Mother Prostate Cancer - Brother    Notes: Mother deceased at age 37 from dementia  Father deceased at 19 from dementia   SOCIAL HISTORY: Marital Status: Married Preferred Language: English; Ethnicity: Not Hispanic Or Latino; Race: White Current Smoking Status: Patient has never smoked.   Tobacco Use Assessment Completed: Used Tobacco in last 30 days? Does not use smokeless tobacco. Drinks  1 drink per year.  Does not use drugs. Drinks 1 caffeinated drink per day. Patient's occupation is/was Retired.    REVIEW OF SYSTEMS:    GU Review Male:   Patient reports burning/ pain with urination, trouble starting your stream, and have to strain to urinate . Patient denies frequent urination, hard to postpone urination, get up at night to urinate, leakage of urine, stream starts and stops, erection problems, and penile pain.  Gastrointestinal (Upper):   Patient reports nausea and vomiting. Patient denies indigestion/ heartburn.  Gastrointestinal (Lower):   Patient denies diarrhea and constipation.  Constitutional:   Patient reports fatigue. Patient denies fever, night sweats, and weight loss.  Skin:   Patient denies skin rash/ lesion and itching.  Eyes:   Patient denies blurred vision and double vision.  Ears/ Nose/ Throat:   Patient denies sore throat and sinus problems.  Hematologic/Lymphatic:   Patient denies swollen glands and easy bruising.  Cardiovascular:   Patient reports chest pains. Patient denies leg swelling.  Respiratory:   Patient reports shortness of breath. Patient denies cough.  Endocrine:   Patient denies excessive thirst.  Musculoskeletal:   Patient reports back pain. Patient denies joint pain.  Neurological:   Patient reports headaches and dizziness.  Psychologic:   Patient denies depression and anxiety.   VITAL SIGNS:      10/27/2017 11:02 AM  Weight 243 lb / 110.22 kg  Height 75 in / 190.5 cm  BP 146/75 mmHg  Pulse 83 /min  Temperature 97.9 F / 36.6 C  BMI 30.4 kg/m   MULTI-SYSTEM PHYSICAL EXAMINATION:    Constitutional: Obese. No physical deformities. Normally developed. Good grooming. He appears uncomfortable.  Respiratory: No labored breathing, no use of accessory muscles.   Cardiovascular: Normal temperature, normal extremity pulses, no swelling, no varicosities.  Skin: No paleness, no jaundice, no cyanosis. No lesion, no ulcer, no rash.  Neurologic  / Psychiatric: Oriented to time, oriented to place, oriented to person. No depression, no anxiety, no agitation.  Gastrointestinal: Obese abdomen. No mass, no tenderness, no rigidity. No CVAT, no flank or suprapubic tenderness.  Musculoskeletal: Normal gait and station of head and neck.     PAST DATA REVIEWED:  Source Of History:  Patient, Family/Caregiver  Records Review:   Previous Hospital Records, Previous Patient Records  Urine Test Review:   Urinalysis, Urine Culture  X-Ray Review: C.T. Stone Protocol: Reviewed Films. Discussed With Patient.     09/22/17 08/29/09  PSA  Total PSA 1.95 ng/mL 1.69     10/20/07  Hormones  Testosterone, Total 2.33     10/27/17  Urinalysis  Urine Appearance Cloudy   Urine Color Amber   Urine Glucose Neg mg/dL  Urine Bilirubin Neg mg/dL  Urine Ketones Trace mg/dL  Urine Specific Gravity 1.015   Urine Blood 3+ ery/uL  Urine pH 7.5   Urine Protein 1+ mg/dL  Urine Urobilinogen 0.2 mg/dL  Urine Nitrites Neg   Urine Leukocyte Esterase 1+ leu/uL  Urine WBC/hpf 0 - 5/hpf   Urine RBC/hpf 20 - 40/hpf   Urine Epithelial Cells NS (Not Seen)   Urine Bacteria Mod (26-50/hpf)   Urine Mucous Not Present   Urine Yeast NS (Not Seen)   Urine Trichomonas Not Present   Urine Cystals NS (Not Seen)   Urine Casts NS (Not Seen)   Urine Sperm Not Present    PROCEDURES:         C.T. Urogram - [id number]              PVR Ultrasound - 91505  Scanned Volume: 34 cc         Ceftriaxone 1g - 96372, [id number] Qty: 1 Adm. By: Parks Ranger  Unit: gram Lot No [id number]  Route: IM Exp. Date 10/07/2018  Freq: None Mfgr.:   Site: Left Buttock         Phenergan 25mg  - [id number], [id number] Qty: 25 Adm. By: Geoffery Spruce  Unit: mg Lot No [id number]  Route: IM Exp. Date 01/07/2019  Freq: None Mfgr.:   Site: Right Buttock   ASSESSMENT:      ICD-10 Details  1 GU:   Flank Pain - R10.84   2   Renal calculus - N20.0    PLAN:           Orders Labs Urine Culture  X-Rays: C.T.  Stone Protocol Without Contrast          Schedule Return Visit/Planned Activity: ASAP - Schedule Surgery  Procedure: 10/27/2017 at [facility] - [place] 25mg  (Phenergan Per 50 Mg) - [id number], [phone]  Procedure: 10/27/2017 at [facility] - [phone] - Ceftriaxone 1g (Injection, Ceftriaxone Sodium, Per 250 Mg) - [id number], [phone]  Document Letter(s):  Created for Patient: Clinical Summary         Notes:   Rocephin administered for UTi prophylaxis, concerns for infection based on UA. He has obstruction from a right ureteral calculi. I spoke with the patient's urologist and reviewed CT imaging with him. He will be taken for ureteroscopy evening with goal of right-sided stone clearance. have reviewed the risks of ureteroscopy including bleeding, infection, ureteral injury, need for a stent or secondary procedures, thrombotic events and anesthetic complications. He and his wife expressed understanding. Urine also sent for repeat urine c/s.

## 2017-10-27 NOTE — Transfer of Care (Signed)
Immediate Anesthesia Transfer of Care Note  Patient: Kenneth [name]  Procedure(s) Performed: CYSTOSCOPY/RETROGRADE/URETEROSCOPY/STONE EXTRACTION WITH BASKET/URETERAL STENT PLACEMENT and laser (Right Ureter) POSSIBLE HOLMIUM LASER APPLICATION (Right )  Patient Location: PACU  Anesthesia Type:General  Level of Consciousness: awake, alert  and oriented  Airway & Oxygen Therapy: Patient Spontanous Breathing and Patient connected to face mask oxygen  Post-op Assessment: Report given to RN and Post -op Vital signs reviewed and stable  Post vital signs: Reviewed and stable  Last Vitals:  Vitals Value Taken Time  BP 157/94 10/27/2017  5:57 PM  Temp    Pulse 94 10/27/2017  5:59 PM  Resp 23 10/27/2017  5:59 PM  SpO2 100 % 10/27/2017  5:59 PM  Vitals shown include unvalidated device data.  Last Pain:  Vitals:   10/27/17 1600  TempSrc:   PainSc: 5       Patients Stated Pain Goal: 4 (76/81/15 7262)  Complications: No apparent anesthesia complications

## 2017-10-27 NOTE — Anesthesia Postprocedure Evaluation (Signed)
Anesthesia Post Note  Patient: Kenneth [name]  Procedure(s) Performed: CYSTOSCOPY/RETROGRADE/URETEROSCOPY/STONE EXTRACTION WITH BASKET/URETERAL STENT PLACEMENT and laser (Right Ureter) POSSIBLE HOLMIUM LASER APPLICATION (Right )     Patient location during evaluation: PACU Anesthesia Type: General Level of consciousness: awake and alert, awake and oriented Pain management: pain level controlled Vital Signs Assessment: post-procedure vital signs reviewed and stable Respiratory status: spontaneous breathing, nonlabored ventilation and respiratory function stable Cardiovascular status: blood pressure returned to baseline and stable Postop Assessment: no apparent nausea or vomiting Anesthetic complications: no    Last Vitals:  Vitals:   10/27/17 1830 10/27/17 1845  BP: (!) 151/71 (!) 148/65  Pulse: 88 90  Resp: (!) 29 13  Temp:  37.6 C  SpO2: 94% 94%    Last Pain:  Vitals:   10/27/17 1830  TempSrc:   PainSc: 0-No pain                 Catalina Gravel

## 2017-10-27 NOTE — Progress Notes (Signed)
Pharmacy Antibiotic Note  Kenneth [name] is a 72 y.o. male admitted on 10/27/2017 with UTI.   He is s/p cystoscopy with rt ureteroscopic stone extraction & double J-stent placement.  Pharmacy has been consulted for gentamicin dosing. Rocephin per MD.  10/27/2017:  Tm 99.55F  WBC 16.1  Scr 0.92, NCrCl~68ml/min  Plan:  Gentamicin 480mg  IV q24h (~5mg /kg)  Monitor renal function and cx data   Check 10hr gentamicin level  Height: 6\' 3"  (190.5 cm) Weight: 243 lb (110.2 kg) IBW/kg (Calculated) : 84.5  Temp (24hrs), Avg:99.1 F (37.3 C), Min:98.2 F (36.8 C), Max:99.9 F (37.7 C)  Recent Labs  Lab 10/27/17 1512  WBC 16.1*  CREATININE 0.92    Estimated Creatinine Clearance: 97.3 mL/min (by C-G formula based on SCr of 0.92 mg/dL).    Allergies  Allergen Reactions  . Hydrocodone Shortness Of Breath    In combination with decongestants  . Other Palpitations and Other (See Comments)    ALL DECONGESTANTS - CAUSE PALPITATIONS; THROWS HEART RHYTHM OUT OF BALANCE  . Oxycodone Shortness Of Breath  . Pseudoephedrine Palpitations  . Tramadol Shortness Of Breath and Rash    flushing    Antimicrobials this admission: 7/22 Ancef x1 pre-op 7/22 Rocephin >> 7/22 Gentamicin >>   Dose adjustments this admission:  Microbiology results: 7/22 BCx: (drawn after abx given) *Ucx pending from [facility] office visit  Thank you for allowing pharmacy to be a part of this patient's care.  Biagio Borg 10/27/2017 6:13 PM

## 2017-10-27 NOTE — Discharge Instructions (Signed)
Ureteral Stent Implantation, Care After Refer to this sheet in the next few weeks. These instructions provide you with information about caring for yourself after your procedure. Your health care provider may also give you more specific instructions. Your treatment has been planned according to current medical practices, but problems sometimes occur. Call your health care provider if you have any problems or questions after your procedure. What can I expect after the procedure? After the procedure, it is common to have:  Nausea.  Mild pain when you urinate. You may feel this pain in your lower back or lower abdomen. Pain should stop within a few minutes after you urinate. This may last for up to 1 week.  A small amount of blood in your urine for several days.  Follow these instructions at home:  Medicines  Take over-the-counter and prescription medicines only as told by your health care provider.  If you were prescribed an antibiotic medicine, take it as told by your health care provider. Do not stop taking the antibiotic even if you start to feel better.  Do not drive for 24 hours if you received a sedative.  Do not drive or operate heavy machinery while taking prescription pain medicines. Activity  Return to your normal activities as told by your health care provider. Ask your health care provider what activities are safe for you.  Do not lift anything that is heavier than 10 lb (4.5 kg). Follow this limit for 1 week after your procedure, or for as long as told by your health care provider. General instructions  Watch for any blood in your urine. Call your health care provider if the amount of blood in your urine increases.  If you have a catheter: ? Follow instructions from your health care provider about taking care of your catheter and collection bag. ? Do not take baths, swim, or use a hot tub until your health care provider approves.  Drink enough fluid to keep your urine  clear or pale yellow.  Keep all follow-up visits as told by your health care provider. This is important. Contact a health care provider if:  You have pain that gets worse or does not get better with medicine, especially pain when you urinate.  You have difficulty urinating.  You feel nauseous or you vomit repeatedly during a period of more than 2 days after the procedure. Get help right away if:  Your urine is dark red or has blood clots in it.  You are leaking urine (have incontinence).  The end of the stent comes out of your urethra.  You cannot urinate.  You have sudden, sharp, or severe pain in your abdomen or lower back.  You have a fever.  You may remove the stent on Friday morning by pulling the attached string.  If you don't feel comfortable doing that, please let the office know and we will get you in to have it removed.  This information is not intended to replace advice given to you by your health care provider. Make sure you discuss any questions you have with your health care provider. Document Released: 11/25/2012 Document Revised: 08/31/2015 Document Reviewed: 10/07/2014 Elsevier Interactive Patient Education  [employer].

## 2017-10-28 ENCOUNTER — Other Ambulatory Visit: Payer: Self-pay

## 2017-10-28 ENCOUNTER — Encounter (HOSPITAL_COMMUNITY): Payer: Self-pay | Admitting: Urology

## 2017-10-28 DIAGNOSIS — N3289 Other specified disorders of bladder: Secondary | ICD-10-CM | POA: Diagnosis not present

## 2017-10-28 DIAGNOSIS — R011 Cardiac murmur, unspecified: Secondary | ICD-10-CM | POA: Diagnosis not present

## 2017-10-28 DIAGNOSIS — Z7982 Long term (current) use of aspirin: Secondary | ICD-10-CM | POA: Diagnosis not present

## 2017-10-28 DIAGNOSIS — I480 Paroxysmal atrial fibrillation: Secondary | ICD-10-CM | POA: Diagnosis not present

## 2017-10-28 DIAGNOSIS — Z955 Presence of coronary angioplasty implant and graft: Secondary | ICD-10-CM | POA: Diagnosis not present

## 2017-10-28 DIAGNOSIS — E1165 Type 2 diabetes mellitus with hyperglycemia: Secondary | ICD-10-CM | POA: Diagnosis not present

## 2017-10-28 DIAGNOSIS — Z7902 Long term (current) use of antithrombotics/antiplatelets: Secondary | ICD-10-CM | POA: Diagnosis not present

## 2017-10-28 DIAGNOSIS — R5082 Postprocedural fever: Secondary | ICD-10-CM | POA: Diagnosis not present

## 2017-10-28 DIAGNOSIS — E7800 Pure hypercholesterolemia, unspecified: Secondary | ICD-10-CM | POA: Diagnosis present

## 2017-10-28 DIAGNOSIS — Z79899 Other long term (current) drug therapy: Secondary | ICD-10-CM | POA: Diagnosis not present

## 2017-10-28 DIAGNOSIS — Z87442 Personal history of urinary calculi: Secondary | ICD-10-CM | POA: Diagnosis not present

## 2017-10-28 DIAGNOSIS — D72829 Elevated white blood cell count, unspecified: Secondary | ICD-10-CM | POA: Diagnosis not present

## 2017-10-28 DIAGNOSIS — K449 Diaphragmatic hernia without obstruction or gangrene: Secondary | ICD-10-CM | POA: Diagnosis not present

## 2017-10-28 DIAGNOSIS — N132 Hydronephrosis with renal and ureteral calculous obstruction: Secondary | ICD-10-CM | POA: Diagnosis not present

## 2017-10-28 DIAGNOSIS — R310 Gross hematuria: Secondary | ICD-10-CM | POA: Diagnosis not present

## 2017-10-28 DIAGNOSIS — K219 Gastro-esophageal reflux disease without esophagitis: Secondary | ICD-10-CM | POA: Diagnosis not present

## 2017-10-28 DIAGNOSIS — I2510 Atherosclerotic heart disease of native coronary artery without angina pectoris: Secondary | ICD-10-CM | POA: Diagnosis not present

## 2017-10-28 DIAGNOSIS — N201 Calculus of ureter: Secondary | ICD-10-CM | POA: Diagnosis not present

## 2017-10-28 DIAGNOSIS — I10 Essential (primary) hypertension: Secondary | ICD-10-CM | POA: Diagnosis not present

## 2017-10-28 LAB — BASIC METABOLIC PANEL
Anion gap: 9 (ref 5–15)
BUN: 16 mg/dL (ref 8–23)
CHLORIDE: 101 mmol/L (ref 98–111)
CO2: 25 mmol/L (ref 22–32)
Calcium: 8.7 mg/dL — ABNORMAL LOW (ref 8.9–10.3)
Creatinine, Ser: 1.01 mg/dL (ref 0.61–1.24)
GFR calc non Af Amer: >60 mL/min (ref >60)
Glucose, Bld: 320 mg/dL — ABNORMAL HIGH (ref 70–99)
Potassium: 4.5 mmol/L (ref 3.5–5.1)
SODIUM: 135 mmol/L (ref 135–145)

## 2017-10-28 LAB — GENTAMICIN LEVEL, RANDOM: GENTAMICIN RM: 2.5 ug/mL

## 2017-10-28 LAB — CBC
HEMATOCRIT: 37 % — AB (ref 39.0–52.0)
Hemoglobin: 12.2 g/dL — ABNORMAL LOW (ref 13.0–17.0)
MCH: 29 pg (ref 26.0–34.0)
MCHC: 33 g/dL (ref 30.0–36.0)
MCV: 88.1 fL (ref 78.0–100.0)
Platelets: 171 10*3/uL (ref 150–400)
RBC: 4.2 MIL/uL — AB (ref 4.22–5.81)
RDW: 13.7 % (ref 11.5–15.5)
WBC: 25.2 10*3/uL — AB (ref 4.0–10.5)

## 2017-10-28 LAB — GLUCOSE, CAPILLARY
GLUCOSE-CAPILLARY: 162 mg/dL — AB (ref 70–99)
GLUCOSE-CAPILLARY: 154 mg/dL — AB (ref 70–99)
GLUCOSE-CAPILLARY: 179 mg/dL — AB (ref 70–99)
Glucose-Capillary: 261 mg/dL — ABNORMAL HIGH (ref 70–99)

## 2017-10-28 MED ORDER — PREDNISONE 5 MG PO TABS
5.0000 mg | ORAL_TABLET | Freq: Every day | ORAL | Status: DC
  Administered 2017-10-28 – 2017-10-29 (×2): 5 mg via ORAL
  Filled 2017-10-28 (×2): qty 1

## 2017-10-28 MED ORDER — CALCIPOTRIENE 0.005 % EX CREA: 1.0000 "application " | TOPICAL_CREAM | Freq: Two times a day (BID) | CUTANEOUS | Status: DC | PRN

## 2017-10-28 MED ORDER — LORATADINE 10 MG PO TABS
10.0000 mg | ORAL_TABLET | Freq: Every day | ORAL | Status: DC
  Administered 2017-10-28 – 2017-10-29 (×2): 10 mg via ORAL
  Filled 2017-10-28 (×2): qty 1

## 2017-10-28 MED ORDER — PANTOPRAZOLE SODIUM 40 MG PO TBEC
40.0000 mg | DELAYED_RELEASE_TABLET | Freq: Every day | ORAL | Status: DC
  Administered 2017-10-28 – 2017-10-29 (×2): 40 mg via ORAL
  Filled 2017-10-28 (×2): qty 1

## 2017-10-28 MED ORDER — GABAPENTIN 300 MG PO CAPS
300.0000 mg | ORAL_CAPSULE | Freq: Two times a day (BID) | ORAL | Status: DC
  Administered 2017-10-28 – 2017-10-29 (×2): 300 mg via ORAL
  Filled 2017-10-28 (×2): qty 1

## 2017-10-28 MED ORDER — FLURANDRENOLIDE 4 MCG/SQCM EX TAPE
1.0000 | MEDICATED_TAPE | Freq: Every day | CUTANEOUS | Status: DC
  Administered 2017-10-28: 1 via TOPICAL

## 2017-10-28 MED ORDER — BIOTIN 1000 MCG PO TABS: 1000.0000 ug | ORAL_TABLET | Freq: Every day | ORAL | Status: DC

## 2017-10-28 MED ORDER — MELOXICAM 15 MG PO TABS
15.0000 mg | ORAL_TABLET | Freq: Every day | ORAL | Status: DC | PRN
  Filled 2017-10-28: qty 1

## 2017-10-28 MED ORDER — METFORMIN HCL 500 MG PO TABS
500.0000 mg | ORAL_TABLET | Freq: Two times a day (BID) | ORAL | Status: DC
  Administered 2017-10-28 – 2017-10-29 (×2): 500 mg via ORAL
  Filled 2017-10-28 (×2): qty 1

## 2017-10-28 MED ORDER — DILTIAZEM HCL ER COATED BEADS 180 MG PO CP24
180.0000 mg | ORAL_CAPSULE | Freq: Every evening | ORAL | Status: DC
  Administered 2017-10-28: 180 mg via ORAL
  Filled 2017-10-28: qty 1

## 2017-10-28 MED ORDER — ARTIFICIAL TEARS OPHTHALMIC OINT
TOPICAL_OINTMENT | Freq: Every day | OPHTHALMIC | Status: DC | PRN
Start: 2017-10-28 — End: 2017-10-29
  Filled 2017-10-28: qty 3.5

## 2017-10-28 MED ORDER — ACITRETIN 10 MG PO CAPS: 10.0000 mg | ORAL_CAPSULE | Freq: Every day | ORAL | Status: DC

## 2017-10-28 MED ORDER — POLYETHYL GLYCOL-PROPYL GLYCOL 0.4-0.3 % OP GEL: 1.0000 "application " | Freq: Every day | OPHTHALMIC | Status: DC | PRN

## 2017-10-28 MED ORDER — ROSUVASTATIN CALCIUM 20 MG PO TABS
20.0000 mg | ORAL_TABLET | Freq: Every evening | ORAL | Status: DC
  Administered 2017-10-28: 20 mg via ORAL
  Filled 2017-10-28: qty 1

## 2017-10-28 MED ORDER — FERROUS SULFATE 325 (65 FE) MG PO TABS
325.0000 mg | ORAL_TABLET | Freq: Every day | ORAL | Status: DC
  Administered 2017-10-28 – 2017-10-29 (×2): 325 mg via ORAL
  Filled 2017-10-28 (×2): qty 1

## 2017-10-28 MED ORDER — NITROGLYCERIN 0.4 MG SL SUBL: 0.4000 mg | SUBLINGUAL_TABLET | SUBLINGUAL | Status: DC | PRN

## 2017-10-28 MED ORDER — POTASSIUM CHLORIDE IN NACL 20-0.45 MEQ/L-% IV SOLN
INTRAVENOUS | Status: DC
  Administered 2017-10-28 – 2017-10-29 (×3): via INTRAVENOUS
  Filled 2017-10-28 (×3): qty 1000

## 2017-10-28 MED ORDER — CLOPIDOGREL BISULFATE 75 MG PO TABS
75.0000 mg | ORAL_TABLET | Freq: Every day | ORAL | Status: DC
  Administered 2017-10-28 – 2017-10-29 (×2): 75 mg via ORAL
  Filled 2017-10-28 (×2): qty 1

## 2017-10-28 MED ORDER — ASPIRIN 81 MG PO CHEW
81.0000 mg | CHEWABLE_TABLET | Freq: Every day | ORAL | Status: DC
  Administered 2017-10-28 – 2017-10-29 (×2): 81 mg via ORAL
  Filled 2017-10-28 (×2): qty 1

## 2017-10-28 MED ORDER — CLOBETASOL PROPIONATE 0.05 % EX CREA
1.0000 "application " | TOPICAL_CREAM | Freq: Two times a day (BID) | CUTANEOUS | Status: DC | PRN
  Filled 2017-10-28: qty 15

## 2017-10-28 MED ORDER — INSULIN ASPART 100 UNIT/ML ~~LOC~~ SOLN
0.0000 [IU] | Freq: Three times a day (TID) | SUBCUTANEOUS | Status: DC
  Administered 2017-10-28 (×2): 3 [IU] via SUBCUTANEOUS
  Administered 2017-10-28: 8 [IU] via SUBCUTANEOUS
  Administered 2017-10-29: 3 [IU] via SUBCUTANEOUS

## 2017-10-28 NOTE — Progress Notes (Signed)
Pharmacy Antibiotic Note  Kenneth [name] is a 72 y.o. male admitted on 10/27/2017 with UTI.   He is s/p cystoscopy with rt ureteroscopic stone extraction & double J-stent placement.  Pharmacy has been consulted for gentamicin dosing. Rocephin per MD.   10/28/2017:  Afebrile  WBC 25.2  Scr 1.01, NCrCl~92ml/min  Plan:  10 Hour gentamicin level is 2.5 mg/L, which is in target range.   Continue Gentamicin 480mg  IV q24h (~5mg /kg)  Monitor renal function and cx data    Height: 6\' 3"  (190.5 cm) Weight: 243 lb (110.2 kg) IBW/kg (Calculated) : 84.5  Temp (24hrs), Avg:98.8 F (37.1 C), Min:97.6 F (36.4 C), Max:99.9 F (37.7 C)  Recent Labs  Lab 10/27/17 1512 10/27/17 1922 10/27/17 2107 10/28/17 0448 10/28/17 0831  WBC 16.1*  --   --  25.2*  --   CREATININE 0.92  --   --  1.01  --   LATICACIDVEN  --  2.1* 1.4  --   --   GENTRANDOM  --   --   --   --  2.5    Estimated Creatinine Clearance: 88.6 mL/min (by C-G formula based on SCr of 1.01 mg/dL).    Allergies  Allergen Reactions  . Hydrocodone Shortness Of Breath    In combination with decongestants  . Other Palpitations and Other (See Comments)    ALL DECONGESTANTS - CAUSE PALPITATIONS; THROWS HEART RHYTHM OUT OF BALANCE  . Oxycodone Shortness Of Breath  . Pseudoephedrine Palpitations  . Tramadol Shortness Of Breath and Rash    flushing    Antimicrobials this admission: 7/22 Ancef x1 pre-op 7/22 Rocephin >> 7/22 Gentamicin >>   Dose adjustments this admission:  Microbiology results: 7/22 BCx: (drawn after abx given) *Ucx pending from [place] u[facility] office visit  Thank you for allowing pharmacy to be a part of this patient's care.   Royetta Asal, PharmD, BCPS Pager [phone] 10/28/2017 2:45 PM

## 2017-10-28 NOTE — Progress Notes (Signed)
1 Day Post-Op Subjective: Kenneth [name] is POD #1 from a right ureteroscopic stone extraction with stenting.  He had a low grade fever post op and was admitted for IV Abx.  His lactate was 2.1 but declined to 1.4 on repeat.  His WBC Count is up to 25 from 16 overnight but he is afebrile.  reports no pain and minimal irritative voiding symptoms.  He feels the best he has in a few weeks.  Urine culture is pending in the office.   Objective: Vital signs in last 24 hours: Temp:  [97.8 F (36.6 C)-99.9 F (37.7 C)] 97.8 F (36.6 C) (07/23 0447) Pulse Rate:  [78-112] 78 (07/23 0447) Resp:  [13-34] 20 (07/23 0447) BP: (113-186)/(58-105) 113/58 (07/23 0447) SpO2:  [88 %-100 %] 96 % (07/23 0447) Weight:  [110.2 kg (243 lb)] 110.2 kg (243 lb) (07/22 1500)  Intake/Output from previous day: 07/22 0701 - 07/23 0700 In: 1533.7 [I.V.:1421.7; IV Piggyback:112] Out: 890 [Urine:890] Intake/Output this shift: No intake/output data recorded.  Physical Exam:  General:alert and no distress GI: Soft, non-tender  Resp: clear to auscultation bilaterally Cardio: regular rate and rhythm, S1, S2 normal, no murmur, click, rub or gallop Extremities: no edema, redness or tenderness in the calves or thighs  Lab Results: Recent Labs    10/27/17 1512 10/28/17 0448  HGB 13.0 12.2*  HCT 38.6* 37.0*   BMET Recent Labs    10/27/17 1512 10/28/17 0448  NA 137 135  K 3.9 4.5  CL 99 101  CO2 25 25  GLUCOSE 167* 320*  BUN 13 16  CREATININE 0.92 1.01  CALCIUM 9.3 8.7*   No results for input(s): LABPT, INR in the last 72 hours. No results for input(s): LABURIN in the last 72 hours. Results for orders placed or performed during the hospital encounter of 05/21/13  Clostridium Difficile by PCR     Status: None   Collection Time: 05/21/13 10:50 PM  Result Value Ref Range Status   Toxigenic C. Difficile by PCR NEGATIVE NEGATIVE Final  MRSA PCR Screening     Status: None   Collection Time: 05/22/13  2:33 AM   Result Value Ref Range Status   MRSA by PCR NEGATIVE NEGATIVE Final    Comment:        The GeneXpert MRSA Assay (FDA approved for NASAL specimens only), is one component of a comprehensive MRSA colonization surveillance program. It is not intended to diagnose MRSA infection nor to guide or monitor treatment for MRSA infections.    Studies/Results: Dg C-arm 1-60 Min-no Report  Result Date: 10/27/2017 Fluoroscopy was utilized by the requesting physician.  No radiographic interpretation.   Labs reviewed.  Assessment/Plan: Right ureteral stone with obstruction and possible UTI.   He is improving but I will continue IV Abx pending the urine culture in the office.  He is tolerating the stent well.   Hyperglycemia.  I will change to Mod Carb diet and take dextrose out of IV.     LOS: 0 days   Irine Seal 10/28/2017, 7:22 AM

## 2017-10-28 NOTE — Progress Notes (Signed)
PHARMACIST - PHYSICIAN ORDER COMMUNICATION  CONCERNING: P&T Medication Policy on Herbal Medications  DESCRIPTION:  This patient's order for:  Biotin  has been noted.  This product(s) is classified as an "herbal" or natural product. Due to a lack of definitive safety studies or FDA approval, nonstandard manufacturing practices, plus the potential risk of unknown drug-drug interactions while on inpatient medications, the Pharmacy and Therapeutics Committee does not permit the use of "herbal" or natural products of this type within [place].   ACTION TAKEN: The pharmacy department is unable to verify this order at this time and your patient has been informed of this safety policy. Please reevaluate patient's clinical condition at discharge and address if the herbal or natural product(s) should be resumed at that time.  Noella Kipnis, PharmD 

## 2017-10-29 DIAGNOSIS — R5082 Postprocedural fever: Secondary | ICD-10-CM | POA: Diagnosis not present

## 2017-10-29 LAB — CBC WITH DIFFERENTIAL/PLATELET
BASOS ABS: 0 10*3/uL (ref 0.0–0.1)
BASOS PCT: 0 %
EOS ABS: 0 10*3/uL (ref 0.0–0.7)
EOS PCT: 0 %
HCT: 37.4 % — ABNORMAL LOW (ref 39.0–52.0)
HEMOGLOBIN: 12.6 g/dL — AB (ref 13.0–17.0)
Lymphocytes Relative: 5 %
Lymphs Abs: 1.1 10*3/uL (ref 0.7–4.0)
MCH: 29.6 pg (ref 26.0–34.0)
MCHC: 33.7 g/dL (ref 30.0–36.0)
MCV: 88 fL (ref 78.0–100.0)
Monocytes Absolute: 0.8 10*3/uL (ref 0.1–1.0)
Monocytes Relative: 4 %
NEUTROS PCT: 91 %
Neutro Abs: 18.7 10*3/uL — ABNORMAL HIGH (ref 1.7–7.7)
PLATELETS: 202 10*3/uL (ref 150–400)
RBC: 4.25 MIL/uL (ref 4.22–5.81)
RDW: 13.6 % (ref 11.5–15.5)
WBC: 20.5 10*3/uL — AB (ref 4.0–10.5)

## 2017-10-29 LAB — BASIC METABOLIC PANEL
Anion gap: 8 (ref 5–15)
BUN: 23 mg/dL (ref 8–23)
CALCIUM: 9.2 mg/dL (ref 8.9–10.3)
CO2: 25 mmol/L (ref 22–32)
CREATININE: 0.91 mg/dL (ref 0.61–1.24)
Chloride: 105 mmol/L (ref 98–111)
Glucose, Bld: 201 mg/dL — ABNORMAL HIGH (ref 70–99)
Potassium: 4.2 mmol/L (ref 3.5–5.1)
Sodium: 138 mmol/L (ref 135–145)

## 2017-10-29 LAB — GLUCOSE, CAPILLARY: Glucose-Capillary: 191 mg/dL — ABNORMAL HIGH (ref 70–99)

## 2017-10-29 MED ORDER — HYOSCYAMINE SULFATE SL 0.125 MG SL SUBL: 0.1250 mg | SUBLINGUAL_TABLET | Freq: Four times a day (QID) | SUBLINGUAL | 1 refills | Status: DC | PRN

## 2017-10-29 MED ORDER — CEFUROXIME AXETIL 250 MG PO TABS: 250.0000 mg | ORAL_TABLET | Freq: Two times a day (BID) | ORAL | 0 refills | Status: AC

## 2017-10-29 NOTE — Progress Notes (Signed)
Patient is stable for discharge. Discharge instructions and medications have been reviewed with the patient and all questions answered. AVS and prescriptions given to patient.  10/29/2017 12:23 PM Dalissa Lovin, [place]

## 2017-10-29 NOTE — Discharge Summary (Signed)
Physician Discharge Summary  Patient ID: Kenneth [name] MRN: [id number] DOB/AGE: 72-Jun-1947 72 y.o.  Admit date: 10/27/2017 Discharge date: 10/29/2017  Admission Diagnoses:  Right ureteral stone  Discharge Diagnoses:  Principal Problem:   Right ureteral stone Active Problems:   Postprocedural fever   Past Medical History:  Diagnosis Date  . Arthritis   . CAD S/P percutaneous coronary angioplasty cardiologist-  dr Ellyn Hack    06/28/16 --PCI with Synergy DES 2.75 mm 20 mm-->m RCA; 07/10/16 PCI with Synergy DES 3.5 mm x 20 mm-->mLAD  . Diverticulosis of colon   . Dyspnea on exertion   . Epiretinal membrane    "epiretinal attachment" (06/28/2016) 10-14-2017 per pt oringinally in both , now only unilateral   . Frequency of urination   . GERD (gastroesophageal reflux disease)   . Heart murmur   . Hiatal hernia   . History of adenomatous polyp of colon   . History of concussion 08/23/2017   w/o loc--- per pt no residual  . History of kidney stones   . History of rheumatic fever 1958  . History of skin cancer    excision leg;  froze the left arm--- unsure BCC or SCC   . Hyperlipidemia   . Hypertension   . ILD (interstitial lung disease) ([place])    pulmologist-  dr Elsworth Soho--  secondary to methotrexate use --- last PFTs 04-25-2015  mild restriction  . Iron deficiency anemia   . Paroxysmal atrial fibrillation [facility]) cardiologist-  dr harding/  EP-- dr allred   a. 11/2003 Tikosyn initiated - subsequently d/c'd;  b. 2006 [place] for Afib @ [facility];  c. 09/2011 Echo: EF 60-65%, Gr 2 DD;  d. DCCV 10/2011 , 04/2012, 12/ 2014, & 02/ 2015;  e. RFCA 01-13-2014  . Psoriasis    rheumatologist and dermologist at [facility] --  dx 1969  . Right ureteral stone   . S/P drug eluting coronary stent placement    06-28-2016  x1 to mRCA;  07-10-2016 x1 to mLAD  . Seasonal allergies   . Type 2 diabetes mellitus ([place])   . Urgency of urination   . Wears hearing aid in both ears     Surgeries:  Procedure(s): CYSTOSCOPY/RETROGRADE/URETEROSCOPY/STONE EXTRACTION WITH BASKET/URETERAL STENT PLACEMENT and laser POSSIBLE HOLMIUM LASER APPLICATION on 7/74/1287   Consultants (if any):   Discharged Condition: Improved  Hospital Course: PAXTEN APPELT is an 72 y.o. male who was admitted 10/27/2017 with a diagnosis of Right ureteral stone and went to the operating room on 10/27/2017 and underwent the above named procedures.   Postoperatively he had a low grade fever and was noted to have leukocytosis and it was felt that admission was indicated for IV antibiotics.  He had a negative culture in the office on 7/19 but a repeat from 7/22 is pending as are his blood cultures.  An initial lactate was 2.1 but fell quickly to normal but his WBC jumped to 25k but is down to 20K on IV rocephin and gentamycin.  He has had some bladder spasms that are controlled with levsin and is otherwise without complaints.   He will be discharged to on oral antibiotics but adjustments will be made based on the cultures.   He was given perioperative antibiotics:  Anti-infectives (From admission, onward)   Start     Dose/Rate Route Frequency Ordered Stop   10/29/17 0000  cefUROXime (CEFTIN) 250 MG tablet     250 mg Oral 2 times daily 10/29/17 0646 11/08/17 2359  10/28/17 1200  cefTRIAXone (ROCEPHIN) 1 g in sodium chloride 0.9 % 100 mL IVPB     1 g 200 mL/hr over 30 Minutes Intravenous Every 24 hours 10/27/17 1809     10/27/17 2000  gentamicin (GARAMYCIN) 480 mg in dextrose 5 % 100 mL IVPB     480 mg 112 mL/hr over 60 Minutes Intravenous Every 24 hours 10/27/17 1823     10/27/17 1537  ceFAZolin (ANCEF) IVPB 2g/100 mL premix  Status:  Discontinued     2 g 200 mL/hr over 30 Minutes Intravenous 30 min pre-op 10/27/17 1537 10/27/17 1538   10/27/17 1429  ceFAZolin (ANCEF) IVPB 2g/100 mL premix     2 g 200 mL/hr over 30 Minutes Intravenous 30 min pre-op 10/27/17 1429 10/27/17 1706    .  He was given sequential  compression devices and early ambulation DVT prophylaxis.  He benefited maximally from the hospital stay and there were no complications.    Recent vital signs:  Vitals:   10/28/17 2216 10/29/17 0639  BP: (!) 151/78 135/75  Pulse: 68 68  Resp: 16 (!) 24  Temp: 97.6 F (36.4 C) 98.6 F (37 C)  SpO2: 100% 100%    Recent laboratory studies:  Lab Results  Component Value Date   HGB 12.6 (L) 10/29/2017   HGB 12.2 (L) 10/28/2017   HGB 13.0 10/27/2017   Lab Results  Component Value Date   WBC 20.5 (H) 10/29/2017   PLT 202 10/29/2017   Lab Results  Component Value Date   INR 1.16 06/28/2016   Lab Results  Component Value Date   NA 138 10/29/2017   K 4.2 10/29/2017   CL 105 10/29/2017   CO2 25 10/29/2017   BUN 23 10/29/2017   CREATININE 0.91 10/29/2017   GLUCOSE 201 (H) 10/29/2017    Discharge Medications:   Allergies as of 10/29/2017      Reactions   Hydrocodone Shortness Of Breath   In combination with decongestants   Other Palpitations, Other (See Comments)   ALL DECONGESTANTS - CAUSE PALPITATIONS; THROWS HEART RHYTHM OUT OF BALANCE   Oxycodone Shortness Of Breath   Pseudoephedrine Palpitations   Tramadol Shortness Of Breath, Rash   flushing      Medication List    TAKE these medications   acetaminophen 500 MG tablet Commonly known as:  TYLENOL Take 1,000 mg by mouth every 6 (six) hours as needed (for pain.).   acitretin 10 MG capsule Commonly known as:  SORIATANE Take 10 mg by mouth daily after breakfast.   aspirin 81 MG chewable tablet CHEW AND SWALLOW 1 TABLET BY MOUTH ONCE DAILY   Biotin 1000 MCG tablet Take 1,000 mcg by mouth at bedtime.   calcipotriene 0.005 % cream Commonly known as:  DOVONOX Apply 1 application topically 2 (two) times daily as needed (for psoriasis.).   cefUROXime 250 MG tablet Commonly known as:  CEFTIN Take 1 tablet (250 mg total) by mouth 2 (two) times daily for 10 days.   cetirizine 10 MG tablet Commonly known as:   ZYRTEC Take 10 mg by mouth every morning.   clobetasol cream 0.05 % Commonly known as:  TEMOVATE Apply 1 application topically 2 (two) times daily as needed (for psoriasis).   clopidogrel 75 MG tablet Commonly known as:  PLAVIX TAKE 1 TABLET BY MOUTH ONCE DAILY WITH BREAKFAST   CORDRAN 4 MCG/SQCM Tape Generic drug:  Flurandrenolide Apply 1 each topically at bedtime. Applied to areas affected by psoriasis.  COSENTYX 300 DOSE [place] Inject into the skin every 30 (thirty) days.   diltiazem 180 MG 24 hr capsule Commonly known as:  CARDIZEM CD Take 1 capsule (180 mg total) by mouth daily. What changed:  when to take this   ferrous sulfate 325 (65 FE) MG EC tablet Take 325 mg by mouth daily with breakfast.   gabapentin 300 MG capsule Commonly known as:  NEURONTIN Take 300 mg by mouth 2 (two) times daily.   HYDROmorphone 2 MG tablet Commonly known as:  DILAUDID Take 1 tablet (2 mg total) by mouth every 12 (twelve) hours as needed for severe pain.   Hyoscyamine Sulfate SL 0.125 MG Subl Commonly known as:  LEVSIN/SL Place 0.125 mg under the tongue every 6 (six) hours as needed (bladder spasm).   meloxicam 15 MG tablet Commonly known as:  MOBIC Take 15 mg by mouth daily as needed for pain.   metFORMIN 500 MG tablet Commonly known as:  GLUCOPHAGE Take by mouth 2 (two) times daily with a meal.   nitroGLYCERIN 0.4 MG SL tablet Commonly known as:  NITROSTAT Place 1 tablet (0.4 mg total) under the tongue every 5 (five) minutes as needed for chest pain.   pantoprazole 40 MG tablet Commonly known as:  PROTONIX TAKE 1 TABLET BY MOUTH ONCE DAILY What changed:    how much to take  how to take this  when to take this   [place] Take 1 tablet by mouth daily.   predniSONE 5 MG tablet Commonly known as:  DELTASONE TAKE 1 TABLET BY MOUTH ONCE DAILY WITH BREAKFAST   rosuvastatin 20 MG tablet Commonly known as:  CRESTOR Take 1 tablet (20 mg total) by mouth  daily. What changed:  when to take this   SYSTANE 0.4-0.3 % Gel ophthalmic gel Generic drug:  Polyethyl Glycol-Propyl Glycol Place 1 application into both eyes daily as needed.   zolpidem 10 MG tablet Commonly known as:  AMBIEN Take 5-10 mg by mouth at bedtime as needed for sleep.       Diagnostic Studies: Dg C-arm 1-60 Min-no Report  Result Date: 10/27/2017 Fluoroscopy was utilized by the requesting physician.  No radiographic interpretation.    Disposition: Discharge disposition: 01-Home or Self Care       Discharge Instructions    Discontinue IV   Complete by:  As directed       Follow-up Information    Irine Seal, MD.   Specialty:  Urology Why:  If a follow up appointment was not made, please call the office to schedule f/u with me or a nurse practitioner in 2-3 weeks.    Contact information: [address] [place] [address] [phone]            Signed: Irine Seal 10/29/2017, 6:51 AM

## 2017-11-02 LAB — CULTURE, BLOOD (ROUTINE X 2)
Culture: NO GROWTH
Culture: NO GROWTH
Special Requests: ADEQUATE
Special Requests: ADEQUATE

## 2017-11-10 ENCOUNTER — Encounter: Payer: Self-pay | Admitting: Pulmonary Disease

## 2017-11-10 ENCOUNTER — Ambulatory Visit (INDEPENDENT_AMBULATORY_CARE_PROVIDER_SITE_OTHER): Payer: Medicare Other | Admitting: Pulmonary Disease

## 2017-11-10 DIAGNOSIS — I2510 Atherosclerotic heart disease of native coronary artery without angina pectoris: Secondary | ICD-10-CM | POA: Diagnosis not present

## 2017-11-10 DIAGNOSIS — J849 Interstitial pulmonary disease, unspecified: Secondary | ICD-10-CM

## 2017-11-10 DIAGNOSIS — Z9861 Coronary angioplasty status: Secondary | ICD-10-CM | POA: Diagnosis not present

## 2017-11-10 MED ORDER — PREDNISONE 5 MG PO TABS: ORAL_TABLET | ORAL | 1 refills | Status: DC

## 2017-11-10 NOTE — Addendum Note (Signed)
Addended by: Valerie Salts on: 11/10/2017 03:22 PM   Modules accepted: Orders

## 2017-11-10 NOTE — Assessment & Plan Note (Signed)
72yo M w/ Interstitial Lung Disease 2/2 methotrexate vs hypersensitivity pneumonitis - Currently stable w/o flare ups for 4 months on prednisone 5mg  daily - Alternative therapies for immunosuppression were discussed last visit but patient prefers his current prednisone regimen - Will monitor for long term side effects of prednisone - C/w current regimen - F/u in 4 months

## 2017-11-10 NOTE — Progress Notes (Addendum)
Kenneth [name]    [id number]    May 14, 1945  Primary Care Physician:[facility]  Referring Physician: [facility] [address] [facility] [place], [place] [address]  Chief complaint:  Interstitial Lung Disease  HPI: Mr.Haug is a 72 yo F w/ PMH of Psoriasis, remote smoker, A.fib s/p ablation and CAD s/p 2 stents presenting to the clinic for F/U of interstitial lung disease from 2015.   He has a diagnosis of steroid responsive interstitial lung disease on prednisone 5mg  since 2016. He had a flare up in 05/2017 where his prednisone dose was increased to 10mg  but during his last visit he was titrated down to 5mg  and has been doing well since. He has had no flare ups since his last visit and he states he has started walking for exercise with his wife recently and is able to tolerate walking long distances without rest.  Of note, he had an ED visit on 08/2017 for head injury while repair of his tractor that healed without complications. He also had a recent admissions in 10/16/17 & 10/27/17 for nephrolithiasis s/p surgical extractions. He states he still has 3 stones left and will f/u with his urologist for treatment in a month. Patient denies any complications during his surgical procedures. Denies any F/N/V/D/C. Denies any chest pain/ shortness of breath / cough/ hemoptysis.  Tests: PFT in 1/ 2017 show FVC of 61% TLC of 68%, DLCO of 67%  Outpatient Encounter Medications as of 11/10/2017  Medication Sig  . acetaminophen (TYLENOL) 500 MG tablet Take 1,000 mg by mouth every 6 (six) hours as needed (for pain.).  Marland Kitchen acitretin (SORIATANE) 10 MG capsule Take 10 mg by mouth daily after breakfast.  . aspirin 81 MG chewable tablet CHEW AND SWALLOW 1 TABLET BY MOUTH ONCE DAILY  . Biotin 1000 MCG tablet Take 1,000 mcg by mouth at bedtime.  . calcipotriene (DOVONOX) 0.005 % cream Apply 1 application topically 2 (two) times daily as needed (for psoriasis.).  Marland Kitchen  cetirizine (ZYRTEC) 10 MG tablet Take 10 mg by mouth every morning.   . clobetasol cream (TEMOVATE) 0.27 % Apply 1 application topically 2 (two) times daily as needed (for psoriasis).  . clopidogrel (PLAVIX) 75 MG tablet TAKE 1 TABLET BY MOUTH ONCE DAILY WITH BREAKFAST  . diltiazem (CARDIZEM CD) 180 MG 24 hr capsule Take 1 capsule (180 mg total) by mouth daily. (Patient taking differently: Take 180 mg by mouth every evening. )  . ferrous sulfate 325 (65 FE) MG EC tablet Take 325 mg by mouth daily with breakfast.  . Flurandrenolide (CORDRAN) 4 MCG/SQCM TAPE Apply 1 each topically at bedtime. Applied to areas affected by psoriasis.  Marland Kitchen gabapentin (NEURONTIN) 300 MG capsule Take 300 mg by mouth 2 (two) times daily.   . meloxicam (MOBIC) 15 MG tablet Take 15 mg by mouth daily as needed for pain.  . metFORMIN (GLUCOPHAGE) 500 MG tablet Take by mouth 2 (two) times daily with a meal.   . nitroGLYCERIN (NITROSTAT) 0.4 MG SL tablet Place 1 tablet (0.4 mg total) under the tongue every 5 (five) minutes as needed for chest pain.  . pantoprazole (PROTONIX) 40 MG tablet TAKE 1 TABLET BY MOUTH ONCE DAILY (Patient taking differently: TAKE 1 TABLET BY MOUTH ONCE DAILY PRN)  . Polyethyl Glycol-Propyl Glycol (SYSTANE) 0.4-0.3 % GEL ophthalmic gel Place 1 application into both eyes daily as needed.   . predniSONE (DELTASONE) 5 MG tablet TAKE 1 TABLET BY MOUTH  ONCE DAILY WITH BREAKFAST  . Probiotic Product (PHILLIPS COLON HEALTH PO) Take 1 tablet by mouth daily.  . Secukinumab (COSENTYX 300 DOSE [place]) Inject into the skin every 30 (thirty) days.  Marland Kitchen zolpidem (AMBIEN) 10 MG tablet Take 5-10 mg by mouth at bedtime as needed for sleep.  . [EXPIRED] cefUROXime (CEFTIN) 250 MG tablet Take 1 tablet (250 mg total) by mouth 2 (two) times daily for 10 days.  . rosuvastatin (CRESTOR) 20 MG tablet Take 1 tablet (20 mg total) by mouth daily. (Patient taking differently: Take 20 mg by mouth every evening. )  . [DISCONTINUED]  HYDROmorphone (DILAUDID) 2 MG tablet Take 1 tablet (2 mg total) by mouth every 12 (twelve) hours as needed for severe pain.  . [DISCONTINUED] Hyoscyamine Sulfate SL (LEVSIN/SL) 0.125 MG SUBL Place 0.125 mg under the tongue every 6 (six) hours as needed (bladder spasm).   Facility-Administered Encounter Medications as of 11/10/2017  Medication  . lidocaine (cardiac) 100 mg/69ml (XYLOCAINE) 20 MG/ML injection 2%  . propofol (DIPRIVAN) 10 mg/mL bolus/IV push    Allergies as of 11/10/2017 - Review Complete 11/10/2017  Allergen Reaction Noted  . Hydrocodone Shortness Of Breath 10/14/2017  . Other Palpitations and Other (See Comments) 09/27/2013  . Oxycodone Shortness Of Breath 10/14/2017  . Pseudoephedrine Palpitations 05/21/2013  . Tramadol Shortness Of Breath and Rash 11/01/2013    Past Medical History:  Diagnosis Date  . Arthritis   . CAD S/P percutaneous coronary angioplasty cardiologist-  dr Ellyn Hack    06/28/16 --PCI with Synergy DES 2.75 mm 20 mm-->m RCA; 07/10/16 PCI with Synergy DES 3.5 mm x 20 mm-->mLAD  . Diverticulosis of colon   . Dyspnea on exertion   . Epiretinal membrane    "epiretinal attachment" (06/28/2016) 10-14-2017 per pt oringinally in both , now only unilateral   . Frequency of urination   . GERD (gastroesophageal reflux disease)   . Heart murmur   . Hiatal hernia   . History of adenomatous polyp of colon   . History of concussion 08/23/2017   w/o loc--- per pt no residual  . History of kidney stones   . History of rheumatic fever 1958  . History of skin cancer    excision leg;  froze the left arm--- unsure BCC or SCC   . Hyperlipidemia   . Hypertension   . ILD (interstitial lung disease) ([place])    pulmologist-  dr Elsworth Soho--  secondary to methotrexate use --- last PFTs 04-25-2015  mild restriction  . Iron deficiency anemia   . Paroxysmal atrial fibrillation [facility]) cardiologist-  dr harding/  EP-- dr allred   a. 11/2003 Tikosyn initiated - subsequently d/c'd;  b. 2006  [place] for Afib @ [place];  c. 09/2011 Echo: EF 60-65%, Gr 2 DD;  d. DCCV 10/2011 , 04/2012, 12/ 2014, & 02/ 2015;  e. RFCA 01-13-2014  . Psoriasis    rheumatologist and dermologist at [facility] --  dx 1969  . Right ureteral stone   . S/P drug eluting coronary stent placement    06-28-2016  x1 to mRCA;  07-10-2016 x1 to mLAD  . Seasonal allergies   . Type 2 diabetes mellitus ([place])   . Urgency of urination   . Wears hearing aid in both ears     Past Surgical History:  Procedure Laterality Date  . APPENDECTOMY  1996  . ATRIAL FIBRILLATION ABLATION N/A 01/13/2014   repeat PVI, also left atrial ablation performed with successfull ablation of LA flutter by Dr Rayann Heman  .  ATRIAL FIBRILLATION ABLATION  08/2004   Dr Rolland Porter at [facility] [place])  . CARDIAC CATHETERIZATION  06-30-2003  dr Lia Foyer   no significant coronary obstruction, well preserved LVF, recurrent afib  . CARDIOVASCULAR STRESS TEST  10-22-2016    dr harding   Low risk nuclear study w/ no ischemia/  normal LV function and wall motion ,  nuclear stress ef 57%  . CARDIOVERSION N/A 05/22/2013   Procedure: CARDIOVERSION;  Surgeon: [name], MD;  Location: [place];  Service: Cardiovascular;  Laterality: N/A;  . CARDIOVERSION N/A 10/23/2011   Procedure: CARDIOVERSION;  Surgeon: Deboraha Sprang, MD;  Location: [facility] CATH LAB;  Service: Cardiovascular;  Laterality: N/A;  . CARDIOVERSION N/A 05/01/2012   Procedure: CARDIOVERSION;  Surgeon: Deboraha Sprang, MD;  Location: [facility] CATH LAB;  Service: Cardiovascular;  Laterality: N/A;  . CARPAL TUNNEL RELEASE Right 1980s  . CATARACT EXTRACTION W/ INTRAOCULAR LENS  IMPLANT, BILATERAL Bilateral   . CATARACT EXTRACTION W/ INTRAOCULAR LENS  IMPLANT, BILATERAL  2012  approx.  . CORONARY STENT INTERVENTION N/A 06/28/2016   Procedure: Coronary Stent Intervention;  Surgeon: Leonie Man, MD;  Location: [place] CV LAB;  Service: Cardiovascular;  Laterality: mRCA PCI -Synergy DES 2.75 m x 20 mm (3.1  mm)  . CORONARY STENT INTERVENTION N/A 07/10/2016   Procedure: Coronary Stent Intervention;  Surgeon: Leonie Man, MD;  Location: [place] CV LAB;  Service: Cardiovascular: mLAD PCI Synergy DES 3.5 mm x 20 mm)  . CYSTOSCOPY W/ STONE MANIPULATION  2010   Patient states he has had several kidney stones up until aroune 2010, none since (06/28/2016)  . CYSTOSCOPY WITH RETROGRADE PYELOGRAM, URETEROSCOPY AND STENT PLACEMENT Right 10/16/2017   Procedure: CYSTOSCOPY WITH RIGHT RETROGRADE RIGHT URETEROSCOPY AND STONE BASKET EXTRACTION;  Surgeon: Irine Seal, MD;  Location[facility];  Service: Urology;  Laterality: Right;  . CYSTOSCOPY/RETROGRADE/URETEROSCOPY/STONE EXTRACTION WITH BASKET Right 10/27/2017   Procedure: CYSTOSCOPY/RETROGRADE/URETEROSCOPY/STONE EXTRACTION WITH BASKET/URETERAL STENT PLACEMENT and laser;  Surgeon: Irine Seal, MD;  Location: [facility];  Service: Urology;  Laterality: Right;  . HOLMIUM LASER APPLICATION Right 11/30/537   Procedure: POSSIBLE HOLMIUM LASER APPLICATION;  Surgeon: Irine Seal, MD;  Location: [facility];  Service: Urology;  Laterality: Right;  . INTRAVASCULAR PRESSURE WIRE/FFR STUDY N/A 06/28/2016   Procedure: Intravascular Pressure Wire/FFR Study;  Surgeon: Leonie Man, MD;  Location: [place] CV LAB;  Service: Cardiovascular: FFR of mLAD ~65% lesion = 0.75 post --> Staged PCI  . NASAL SINUS SURGERY     x 2  . PROXIMAL INTERPHALANGEAL FUSION (PIP) Left 01-05-2001    dr graves   correction clawtoe and extensor tendon lengthening  . RIGHT/LEFT HEART CATH AND CORONARY ANGIOGRAPHY N/A 06/28/2016   Procedure: Right/Left Heart Cath and Coronary Angiography;  Surgeon: Leonie Man, MD;  Location: [place] CV LAB;  Service: Cardiovascular: mRCA 99% (TIMI 2), mLAD ~65% (FFR 0.75), OM3 55%. mild Pulm HTN. Mod LVEDP elevation. EF 55-65%.  Marland Kitchen ROTATOR CUFF REPAIR Bilateral last one 2014  . TONSILLECTOMY  age 60  . TRANSTHORACIC ECHOCARDIOGRAM  11/15/2013     ef 65-70%,  due to Afib unable to evaluate LVDF/  AV sclerosis without stenosis or regurg./  MV calcification without stenosis or regurg/  ascending aorta mild dilated, 74mm) ,  mild to moderate LAE/  mild Tr/  RVSP 102mmHg  . URETEROSCOPIC LASER LITHOTRIPSY STONE EXTRACTIONS/  STENT PLACEMENT Bilateral 09-10-2007   dr Jeffie Pollock  [facility]   per  pt has had several prior ureteroscopic stone extraction since age 22 , last one 09-10-2007    Family History  Problem Relation Age of Onset  . Coronary artery disease Mother   . Breast cancer Mother   . Colon cancer Mother   . Heart disease Maternal Grandmother        MI  . Stroke Paternal Grandfather        CVA  . Coronary artery disease Paternal Grandfather   . Esophageal cancer Neg Hx   . Stomach cancer Neg Hx   . Rectal cancer Neg Hx     Social History   Socioeconomic History  . Marital status: Married    Spouse name: Not on file  . Number of children: 1  . Years of education: Not on file  . Highest education level: Not on file  Occupational History  . Occupation: Retired Armed forces operational officer  Social Needs  . Financial resource strain: Not on file  . Food insecurity:    Worry: Not on file    Inability: Not on file  . Transportation needs:    Medical: Not on file    Non-medical: Not on file  Tobacco Use  . Smoking status: Former Smoker    Years: 10.00    Types: Cigars    Last attempt to quit: 09/29/1997    Years since quitting: 20.1  . Smokeless tobacco: Former Systems developer    Types: Snuff, Chew  Substance and Sexual Activity  . Alcohol use: No  . Drug use: No  . Sexual activity: Not on file  Lifestyle  . Physical activity:    Days per week: Not on file    Minutes per session: Not on file  . Stress: Not on file  Relationships  . Social connections:    Talks on phone: Not on file    Gets together: Not on file    Attends religious service: Not on file    Active member of club or organization: Not on file    Attends meetings of clubs or  organizations: Not on file    Relationship status: Not on file  . Intimate partner violence:    Fear of current or ex partner: Not on file    Emotionally abused: Not on file    Physically abused: Not on file    Forced sexual activity: Not on file  Other Topics Concern  . Not on file  Social History Narrative   Pt lives in [place] with spouse.     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    Review of systems: Review of Systems  Constitutional: Negative for fever and chills.  HENT: Negative.   Eyes: Negative for blurred vision.  Respiratory: as per HPI  Cardiovascular: Negative for chest pain and palpitations.  Gastrointestinal: Negative for vomiting, diarrhea, blood per rectum. Genitourinary: Negative for dysuria, urgency, frequency and hematuria.  Musculoskeletal: Negative for myalgias, back pain and joint pain.  Skin: Negative for itching and rash.  Neurological: Negative for dizziness, tremors, focal weakness, seizures and loss of consciousness.  Endo/Heme/Allergies: Negative for environmental allergies.  Psychiatric/Behavioral: Negative for depression, suicidal ideas and hallucinations.  All other systems reviewed and are negative.  Physical Exam: Blood pressure 116/72, pulse 88, height 6\' 3"  (1.905 m), weight 244 lb (110.7 kg), SpO2 98 %. Gen:      Obese, No acute distress HEENT:  EOMI, sclera anicteric Neck:     No masses; no thyromegaly Lungs:    Clear to auscultation  bilaterally, normal respiratory effort, no rales or wheeze. CV:         Regular rate and rhythm; no murmurs Ext:    No edema; adequate peripheral perfusion Skin:      Warm and dry; no rash Neuro: alert and oriented x 3 Psych: normal mood and affec  Assessment:  Refer to Encounters tab for problem based charting  Plan/Recommendations: Refer to Encounters tab for problem based charting  Gilberto Better, PGY1 [place] 11/10/2017, 10:36 AM  CC: [facility]  Independently examined pt, evaluated data & formulated above care plan with resident   Knute has steroid responsive ILD and being favored NSIP.  There is a history of being on methotrexate in the remote past. He has responded well to steroids and has been maintained on low-dose about 5 mg of prednisone.  We have discussed immunosuppressants in the past and he has preferred not to go on this. He has done well in the interim, he denies cough and shortness of breath is at baseline. He has had issues with renal calculi and has required procedures which were uneventful.  He also had a black eye due to injury. CT chest from 05/2017 was reviewed which shows stable disease and given his problems with PFTs in the past we will defer this unless absolutely necessary. He continues to struggle with psoriasis and has not clearly been given a diagnosis of collagen vascular disease  Rakesh V. Elsworth Soho MD

## 2017-11-10 NOTE — Patient Instructions (Signed)
Lungs appear stable

## 2017-11-18 ENCOUNTER — Encounter: Payer: Self-pay | Admitting: Pulmonary Disease

## 2017-11-18 NOTE — Progress Notes (Addendum)
   Interstitial Lung Disease Multidisciplinary Conference   Kenneth [name]    MRN [id number]    DOB 1945-11-18  Primary Care Physician:[facility]  Referring Physician: Dr. Kara Mead, MD  Date of conference: 11/18/2017  Participating Pulmonary: Dr. Kara Mead, Dr. Brand Males, Dr. Marshell Garfinkel Pathology: Dr. Jaquita Folds  Radiology: Dr. Salvatore Marvel Others:  Brief History: 72 year old with diffuse interstitial lung disease responsive to steroids.  History of psoriatic arthritis-was on methotrexate, Enbrel  CT scan: Central and peripheral groundglass opacities with reticulation.  No basal gradient, traction bronchiectasis or honeycombing.  There is evidence of moderate trapping and airway thickening. No progression  Pathology: None available  Labs: ANA, rheumatoid factor negative in 2015  PFTs 2017-mild restriction, diffusion defect.  No obstruction.  Impression/Reccs: Diffuse interstitial lung disease, indeterminate for UIP Possible hypersensitivity pneumonitis  Can consider bronchoscopy with BAL, transbronchial or surgical lung biopsy Repeat extended connective tissue disease serologies and check hypersensitivity panel.  References: Diagnosis of Idiopathic Pulmonary Fibrosis. An Official ATS/ERS/JRS/ALAT Clinical Practice Guideline. Raghu G et al, Roy. 2018 Sep 1;198(5):e44-[id number].   IPF Suspected   Histopath ology Pattern      UIP  Probable UIP  Indeterminate for  UIP  Alternative  diagnosis    UIP  IPF  IPF  IPF  Non-IPF dx   HRCT   Probabe UIP  IPF  IPF  IPF (Likely)**  Non-IPF dx  Pattern  Indeterminate for UIP  IPF  IPF (Likely)**  Indeterminate  for IPF  Non-IPF dx    Alternative diagnosis  IPF (Likely)**/ non-IPF dx  Non-IPF dx  Non-IPF dx  Non-IPF dx     Idiopathic pulmonary fibrosis diagnosis based upon HRCT and Biopsy paterns.  ** IPF is the likely diagnosis when any of  following features are present:  . Moderate-to-severe traction bronchiectasis/bronchiolectasis (defined as mild traction bronchiectasis/bronchiolectasis in four or more lobes including the lingual as a lobe, or moderate to severe traction bronchiectasis in two or more lobes) in a man over age 49 years or in a woman over age 53 years . Extensive (>30%) reticulation on HRCT and an age >70 years  . Increased neutrophils and/or absence of lymphocytosis in BAL fluid  . Multidisciplinary discussion reaches a confident diagnosis of IPF.   Indeterminate for IPF  . Without an adequate biopsy is unlikely to be IPF  . With an adequate biopsy may be reclassified to a more specific diagnosis after multidisciplinary discussion and/or additional consultation.   dx = diagnosis; HRCT = high-resolution computed tomography; IPF = idiopathic pulmonary fibrosis; UIP = usual interstitial pneumonia.

## 2017-12-04 DIAGNOSIS — N3000 Acute cystitis without hematuria: Secondary | ICD-10-CM | POA: Diagnosis not present

## 2017-12-04 DIAGNOSIS — N200 Calculus of kidney: Secondary | ICD-10-CM | POA: Diagnosis not present

## 2017-12-11 ENCOUNTER — Other Ambulatory Visit: Payer: Self-pay

## 2017-12-11 MED ORDER — DILTIAZEM HCL ER COATED BEADS 180 MG PO CP24: 180.0000 mg | ORAL_CAPSULE | Freq: Every evening | ORAL | 2 refills | Status: DC

## 2017-12-12 ENCOUNTER — Telehealth: Payer: Self-pay | Admitting: Cardiology

## 2017-12-12 NOTE — Telephone Encounter (Signed)
Returned call to patient/wife - received verbal consent to speak with wife. He c/o palps/AF/SOB Patient sees Dr. Ellyn Hack (gen cards), Dr. Lawrence Marseilles NP (AF)   Patient had a UTI and was prescribed cefpodoxime which has side effects of getting heart out of rhythm/palpitations per wife. He took the med for 7 days. He stopped the medication early (last night) d/t side effects of palpitations, trouble breathing, chest tightness. He was walking yesterday and had to stop after 1/2 mile of walking - he had chest tightness - he did not take NTG as this is for emergences. He is out of rhythm and has no energy when he is out of rhythm.   BP this morning was 141/63 and HR 81 BP last night was in the 150s/mid 50s HR 89  Patient's wife wished for call to be sent to Dr. Jackalyn Lombard office.   Reviewed APP schedule at [facility] for openings and there are none. No AF clinic appts are available today. Both patient's MDs are not in the office. Notified wife will send a message to DOD to review and advise. Tentatively scheduled patient for 1st opening with Dr. Ellyn Hack on 9/12.

## 2017-12-12 NOTE — Telephone Encounter (Signed)
Talked with patient's wife - states developed AF last PM - is having exertional chest pain and shortness of breath since then. Is off DOAC since stents. HR is controlled in the 80s. Discussed with Roderic Palau NP recommends since within 48 hour window to go to ER for cardioversion for relief of symptoms. Patient refuses this plan as he does not want to go to ER. Other option is see if AF converts on its on with continued treatment of UTI - explained if waits and AF doesn't break on its on will have to load on DOAC before can have cardioversion as an outpatient after the 48 hour window. Patient decided he will come in next week for appt - will call and cancel if returns to [place] on his own. Per Butch Penny he should continue his antibiotics as ordered.

## 2017-12-12 NOTE — Telephone Encounter (Signed)
° °  Patient's spouse calling to report afib and some SOB when walking on 12/11/17      1) How long have you had palpitations/irregular HR/ Afib? Are you having the symptoms now? 2 days  Are you currently experiencing lightheadedness, SOB or CP? Dizziness, SOB 2) Do you have a history of afib (atrial fibrillation) or irregular heart rhythm?  YES  3) Have you checked your BP or HR? (document readings if available): 141/63 HR81  4) Are you experiencing any other symptoms? NO

## 2017-12-16 ENCOUNTER — Ambulatory Visit (HOSPITAL_COMMUNITY)
Admission: RE | Admit: 2017-12-16 | Discharge: 2017-12-16 | Disposition: A | Discharge status: Home / Self Care | Payer: Medicare Other | Source: Ambulatory Visit | Attending: Nurse Practitioner | Admitting: Nurse Practitioner

## 2017-12-16 ENCOUNTER — Encounter (HOSPITAL_COMMUNITY): Payer: Self-pay | Admitting: Nurse Practitioner

## 2017-12-16 VITALS — BP 130/82 | HR 92 | Ht 75.0 in | Wt 250.0 lb

## 2017-12-16 DIAGNOSIS — I4892 Unspecified atrial flutter: Secondary | ICD-10-CM | POA: Diagnosis not present

## 2017-12-16 DIAGNOSIS — Z7984 Long term (current) use of oral hypoglycemic drugs: Secondary | ICD-10-CM | POA: Insufficient documentation

## 2017-12-16 DIAGNOSIS — I480 Paroxysmal atrial fibrillation: Secondary | ICD-10-CM | POA: Diagnosis not present

## 2017-12-16 DIAGNOSIS — K449 Diaphragmatic hernia without obstruction or gangrene: Secondary | ICD-10-CM | POA: Insufficient documentation

## 2017-12-16 DIAGNOSIS — Z7902 Long term (current) use of antithrombotics/antiplatelets: Secondary | ICD-10-CM | POA: Diagnosis not present

## 2017-12-16 DIAGNOSIS — R002 Palpitations: Secondary | ICD-10-CM

## 2017-12-16 DIAGNOSIS — J849 Interstitial pulmonary disease, unspecified: Secondary | ICD-10-CM | POA: Insufficient documentation

## 2017-12-16 DIAGNOSIS — Z7982 Long term (current) use of aspirin: Secondary | ICD-10-CM | POA: Insufficient documentation

## 2017-12-16 DIAGNOSIS — I2510 Atherosclerotic heart disease of native coronary artery without angina pectoris: Secondary | ICD-10-CM | POA: Diagnosis not present

## 2017-12-16 DIAGNOSIS — E785 Hyperlipidemia, unspecified: Secondary | ICD-10-CM | POA: Insufficient documentation

## 2017-12-16 DIAGNOSIS — E119 Type 2 diabetes mellitus without complications: Secondary | ICD-10-CM | POA: Diagnosis not present

## 2017-12-16 DIAGNOSIS — K219 Gastro-esophageal reflux disease without esophagitis: Secondary | ICD-10-CM | POA: Diagnosis not present

## 2017-12-16 DIAGNOSIS — Z79899 Other long term (current) drug therapy: Secondary | ICD-10-CM | POA: Diagnosis not present

## 2017-12-16 DIAGNOSIS — Z955 Presence of coronary angioplasty implant and graft: Secondary | ICD-10-CM | POA: Diagnosis not present

## 2017-12-16 DIAGNOSIS — I10 Essential (primary) hypertension: Secondary | ICD-10-CM | POA: Diagnosis not present

## 2017-12-16 NOTE — Progress Notes (Signed)
Patient ID: Kenneth [name], male   DOB: 10-28-1945, 72 y.o.   MRN: [id number]       PCP: [facility]  Pulmonologist: Dr. Elsworth Soho Cardiologist: Dr. Ellyn Hack EP: Dr.Allred   Kenneth [name] is a 72 y.o. male who presents today in afib clinic, s/p afib/flutter ablation 10/15 by Dr. Rayann Heman,. Afib ablation 2006 at [facility], h/o of prior tikosyn/flecainde use at that time. He does have interstitial lung disease and is on daily prednisone.  He is having close f/u with Dr. Elsworth Soho. 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 He received 2 stents  spring of 2018, continues on asa/plavix.  He has had 2 procedures recently for kidney stones and on antibiotics recently as well UTI. During antibiotic use he was aware of palpitations and read on drug insert that drug could cause palpitations so he stopped drug and called the office. He was advised to continue antibiotic and ap[pointment made here. Today, he states that he is still having palpitations but they are better. His ekg shows SR with sinus arrythmia.   He was seen f/u  from cardiac catherization spring of 2018, performed 3/23 for progressive shortness of breath and 3v CAD seen on chest CT. He did receive a DES of RCA and is staged to have a stent placed of the LAD 4/4. He is breathing much better. He was asked to return here to decide if he needed to be on triple therapy(chadsvas score  4) and brief runs noted on monitor of SVT vrs afib. I discussed with Dr. Rayann Heman and he said if no sustained afib/flutter was seen/documented that he would hold off for now due to risk of bleeding with triple therapy.    Today, he denies symptoms of palpitations,no presyncope, or syncope. Positive for fatigue, progressive dyspnea. exertional dyspnea   The patient is otherwise without complaint today as for above.  Past Medical History:  Diagnosis Date  . Arthritis   . CAD S/P percutaneous coronary angioplasty cardiologist-  dr Ellyn Hack    06/28/16 --PCI with Synergy DES  2.75 mm 20 mm-->m RCA; 07/10/16 PCI with Synergy DES 3.5 mm x 20 mm-->mLAD  . Diverticulosis of colon   . Dyspnea on exertion   . Epiretinal membrane    "epiretinal attachment" (06/28/2016) 10-14-2017 per pt oringinally in both , now only unilateral   . Frequency of urination   . GERD (gastroesophageal reflux disease)   . Heart murmur   . Hiatal hernia   . History of adenomatous polyp of colon   . History of concussion 08/23/2017   w/o loc--- per pt no residual  . History of kidney stones   . History of rheumatic fever 1958  . History of skin cancer    excision leg;  froze the left arm--- unsure BCC or SCC   . Hyperlipidemia   . Hypertension   . ILD (interstitial lung disease) ([place])    pulmologist-  dr Elsworth Soho--  secondary to methotrexate use --- last PFTs 04-25-2015  mild restriction  . Iron deficiency anemia   . Paroxysmal atrial fibrillation [facility]) cardiologist-  dr harding/  EP-- dr allred   a. 11/2003 Tikosyn initiated - subsequently d/c'd;  b. 2006 [place] for Afib @ [place];  c. 09/2011 Echo: EF 60-65%, Gr 2 DD;  d. DCCV 10/2011 , 04/2012, 12/ 2014, & 02/ 2015;  e. RFCA 01-13-2014  . Psoriasis    rheumatologist and dermologist at [facility] --  dx 1969  . Right ureteral stone   .  S/P drug eluting coronary stent placement    06-28-2016  x1 to mRCA;  07-10-2016 x1 to mLAD  . Seasonal allergies   . Type 2 diabetes mellitus ([place])   . Urgency of urination   . Wears hearing aid in both ears    Past Surgical History:  Procedure Laterality Date  . APPENDECTOMY  1996  . ATRIAL FIBRILLATION ABLATION N/A 01/13/2014   repeat PVI, also left atrial ablation performed with successfull ablation of LA flutter by Dr Rayann Heman  . ATRIAL FIBRILLATION ABLATION  08/2004   Dr Rolland Porter at [facility] [place])  . CARDIAC CATHETERIZATION  06-30-2003  dr Lia Foyer   no significant coronary obstruction, well preserved LVF, recurrent afib  . CARDIOVASCULAR STRESS TEST  10-22-2016    dr harding   Low risk  nuclear study w/ no ischemia/  normal LV function and wall motion ,  nuclear stress ef 57%  . CARDIOVERSION N/A 05/22/2013   Procedure: CARDIOVERSION;  Surgeon: [name], MD;  Location: [place];  Service: Cardiovascular;  Laterality: N/A;  . CARDIOVERSION N/A 10/23/2011   Procedure: CARDIOVERSION;  Surgeon: Deboraha Sprang, MD;  Location: [facility] CATH LAB;  Service: Cardiovascular;  Laterality: N/A;  . CARDIOVERSION N/A 05/01/2012   Procedure: CARDIOVERSION;  Surgeon: Deboraha Sprang, MD;  Location[facility] CATH LAB;  Service: Cardiovascular;  Laterality: N/A;  . CARPAL TUNNEL RELEASE Right 1980s  . CATARACT EXTRACTION W/ INTRAOCULAR LENS  IMPLANT, BILATERAL Bilateral   . CATARACT EXTRACTION W/ INTRAOCULAR LENS  IMPLANT, BILATERAL  2012  approx.  . CORONARY STENT INTERVENTION N/A 06/28/2016   Procedure: Coronary Stent Intervention;  Surgeon: Leonie Man, MD;  Location: [place] CV LAB;  Service: Cardiovascular;  Laterality: mRCA PCI -Synergy DES 2.75 m x 20 mm (3.1 mm)  . CORONARY STENT INTERVENTION N/A 07/10/2016   Procedure: Coronary Stent Intervention;  Surgeon: Leonie Man, MD;  Location: [place] CV LAB;  Service: Cardiovascular: mLAD PCI Synergy DES 3.5 mm x 20 mm)  . CYSTOSCOPY W/ STONE MANIPULATION  2010   Patient states he has had several kidney stones up until aroune 2010, none since (06/28/2016)  . CYSTOSCOPY WITH RETROGRADE PYELOGRAM, URETEROSCOPY AND STENT PLACEMENT Right 10/16/2017   Procedure: CYSTOSCOPY WITH RIGHT RETROGRADE RIGHT URETEROSCOPY AND STONE BASKET EXTRACTION;  Surgeon: Irine Seal, MD;  Location: [facility];  Service: Urology;  Laterality: Right;  . CYSTOSCOPY/RETROGRADE/URETEROSCOPY/STONE EXTRACTION WITH BASKET Right 10/27/2017   Procedure: CYSTOSCOPY/RETROGRADE/URETEROSCOPY/STONE EXTRACTION WITH BASKET/URETERAL STENT PLACEMENT and laser;  Surgeon: Irine Seal, MD;  Location: [facility];  Service: Urology;  Laterality: Right;  . HOLMIUM LASER APPLICATION Right  1/61/0960   Procedure: POSSIBLE HOLMIUM LASER APPLICATION;  Surgeon: Irine Seal, MD;  Location: [facility];  Service: Urology;  Laterality: Right;  . INTRAVASCULAR PRESSURE WIRE/FFR STUDY N/A 06/28/2016   Procedure: Intravascular Pressure Wire/FFR Study;  Surgeon: Leonie Man, MD;  Location: [place] CV LAB;  Service: Cardiovascular: FFR of mLAD ~65% lesion = 0.75 post --> Staged PCI  . NASAL SINUS SURGERY     x 2  . PROXIMAL INTERPHALANGEAL FUSION (PIP) Left 01-05-2001    dr graves   correction clawtoe and extensor tendon lengthening  . RIGHT/LEFT HEART CATH AND CORONARY ANGIOGRAPHY N/A 06/28/2016   Procedure: Right/Left Heart Cath and Coronary Angiography;  Surgeon: Leonie Man, MD;  Location: [place] CV LAB;  Service: Cardiovascular: mRCA 99% (TIMI 2), mLAD ~65% (FFR 0.75), OM3 55%. mild Pulm HTN. Mod LVEDP elevation. EF  55-65%.  Marland Kitchen ROTATOR CUFF REPAIR Bilateral last one 2014  . TONSILLECTOMY  age 63  . TRANSTHORACIC ECHOCARDIOGRAM  11/15/2013   ef 65-70%,  due to Afib unable to evaluate LVDF/  AV sclerosis without stenosis or regurg./  MV calcification without stenosis or regurg/  ascending aorta mild dilated, 5mm) ,  mild to moderate LAE/  mild Tr/  RVSP 63mmHg  . URETEROSCOPIC LASER LITHOTRIPSY STONE EXTRACTIONS/  STENT PLACEMENT Bilateral 09-10-2007   dr Jeffie Pollock  [facility]   per pt has had several prior ureteroscopic stone extraction since age 60 , last one 09-10-2007    ROS- all systems are reviewed and negatives except as per HPI above  Current Outpatient Medications  Medication Sig Dispense Refill  . acetaminophen (TYLENOL) 500 MG tablet Take 1,000 mg by mouth every 6 (six) hours as needed (for pain.).    Marland Kitchen acitretin (SORIATANE) 10 MG capsule Take 10 mg by mouth daily after breakfast.    . aspirin 81 MG chewable tablet CHEW AND SWALLOW 1 TABLET BY MOUTH ONCE DAILY 36 tablet 5  . Biotin 1000 MCG tablet Take 1,000 mcg by mouth at bedtime.    . calcipotriene (DOVONOX) 0.005 %  cream Apply 1 application topically 2 (two) times daily as needed (for psoriasis.).    Marland Kitchen cetirizine (ZYRTEC) 10 MG tablet Take 10 mg by mouth every morning.     . clobetasol cream (TEMOVATE) 2.45 % Apply 1 application topically 2 (two) times daily as needed (for psoriasis).    . clopidogrel (PLAVIX) 75 MG tablet TAKE 1 TABLET BY MOUTH ONCE DAILY WITH BREAKFAST 90 tablet 2  . diltiazem (CARDIZEM CD) 180 MG 24 hr capsule Take 1 capsule (180 mg total) by mouth every evening. 90 capsule 2  . ferrous sulfate 325 (65 FE) MG EC tablet Take 325 mg by mouth daily with breakfast.    . Flurandrenolide (CORDRAN) 4 MCG/SQCM TAPE Apply 1 each topically at bedtime. Applied to areas affected by psoriasis.    Marland Kitchen gabapentin (NEURONTIN) 300 MG capsule Take 300 mg by mouth 2 (two) times daily.     . meloxicam (MOBIC) 15 MG tablet Take 15 mg by mouth daily as needed for pain.    . metFORMIN (GLUCOPHAGE) 500 MG tablet Take by mouth 2 (two) times daily with a meal.     . nitroGLYCERIN (NITROSTAT) 0.4 MG SL tablet Place 1 tablet (0.4 mg total) under the tongue every 5 (five) minutes as needed for chest pain. 25 tablet 12  . pantoprazole (PROTONIX) 40 MG tablet TAKE 1 TABLET BY MOUTH ONCE DAILY (Patient taking differently: TAKE 1 TABLET BY MOUTH ONCE DAILY PRN) 30 tablet 4  . Polyethyl Glycol-Propyl Glycol (SYSTANE) 0.4-0.3 % GEL ophthalmic gel Place 1 application into both eyes daily as needed.     . predniSONE (DELTASONE) 5 MG tablet TAKE 1 TABLET BY MOUTH ONCE DAILY WITH BREAKFAST 90 tablet 1  . Probiotic Product (PHILLIPS COLON HEALTH PO) Take 1 tablet by mouth daily.    . rosuvastatin (CRESTOR) 20 MG tablet Take 1 tablet (20 mg total) by mouth daily. (Patient taking differently: Take 20 mg by mouth every evening. ) 30 tablet 11  . Secukinumab (COSENTYX 300 DOSE [place]) Inject into the skin every 30 (thirty) days.    Marland Kitchen zolpidem (AMBIEN) 10 MG tablet Take 5-10 mg by mouth at bedtime as needed for sleep.     No current  facility-administered medications for this encounter.    Facility-Administered Medications Ordered in Other  Encounters  Medication Dose Route Frequency Provider Last Rate Last Dose  . lidocaine (cardiac) 100 mg/31ml (XYLOCAINE) 20 MG/ML injection 2%    Anesthesia Intra-op Flowers, Rokoshi T, CRNA   60 mg at 03/25/13 1617  . propofol (DIPRIVAN) 10 mg/mL bolus/IV push    Anesthesia Intra-op Flowers, Rokoshi T, CRNA   90 mg at 03/25/13 1617    Physical Exam: Vitals:   12/16/17 1523  BP: 130/82  Pulse: 92  SpO2: 97%  Weight: 113.4 kg  Height: 6\' 3"  (1.905 m)  BP rechecked at 148/80  GEN- The patient is well appearing, alert and oriented x 3 today.   Head- normocephalic, atraumatic Eyes-  Sclera clear, conjunctiva pink Ears- hearing intact Oropharynx- clear Lungs-   Normal work of breathing Heart- Regular rate and rhythm, no murmurs, rubs or gallops, PMI not laterally displaced. GI- soft, NT, ND, + BS Extremities- no clubbing, cyanosis, or edema  EKG- NSR, normal EKG, 78 bpm,pr int 174 ms, Qrs int  44ms, qtc 421 ms Epic records reviewed Cardiac catherization-3/23Conclusion     Mid RCA lesion, 99 %stenosed.  A STENT SYNERGY DES 2.75X20 drug eluting stent was successfully placed.  Post intervention, there is a 0% residual stenosis.  Mid LAD lesion, 65 %stenosed. FFR performed => final FFR 0.75 (physiologically significant). Plan staged PCI  3rd Mrg lesion, 55 %stenosed.  Hemodynamic findings consistent with mild pulmonary hypertension. LV end diastolic pressure is moderately elevated.  The left ventricular systolic function is normal.  The left ventricular ejection fraction is 55-65% by visual estimate.    Mild secondary pulmonary hypertension  Severe 2 Vessel disease - 95% mRCA s/p PCI with DES; ~70% prox LAD with FFR 0.75  Codominant Cx with distal ~60% lesion prior to bifurcation into distal LPL branches  Plan: Overnight monitoring post cath. He will need close  monitoring of the right groin hematoma. Likely discharge tomorrow. We'll start scheduled PCI LAD 1st week of April (~April 3rd)- would plan same day d/c  Continue Plavix. Add Statin for d/c. Will likely need to d/c Flecainide given CAD Dx. - Defer to Afib Clinic; continue Diltiazem. Defer full anticoagulation to       Assessment and Plan:  1. Afib/ atypical flutter S/p ablation x 2 last one in 2015 Recent palpitations in the setting of stone extractions with basket and UTI Now improved but more  aware of irregular heart beat In SR today with Sinus Aarrythmia Zio patch x 2 weeks to determine if any sustained afib/flutter which would dictate use of anticoagulation to lower stroke risk He is currently on dual  therapy for prior stents Pt was not taking DOAC in past per his wishes Continue cardizem Chads2vasc score is 4  2. ILD Continue f/u with  pulmonologist  3. HTN Stable   4. DM No changes  5. CAD DES stent to RCA and LAD in 06/2016/07/2016 No current ischemic symptoms Continue asa/plavix   F/u  with Dr. Ellyn Hack as scheduled Will call pt with monitor results and if any needed med changes   Butch Penny C. Damek Ende, [facility] [address] [place], [place] [address] [phone]

## 2017-12-18 ENCOUNTER — Encounter: Payer: Self-pay | Admitting: Cardiology

## 2017-12-18 ENCOUNTER — Ambulatory Visit (INDEPENDENT_AMBULATORY_CARE_PROVIDER_SITE_OTHER): Payer: Medicare Other | Admitting: Cardiology

## 2017-12-18 VITALS — BP 138/82 | HR 83 | Ht 75.0 in | Wt 248.4 lb

## 2017-12-18 DIAGNOSIS — I2510 Atherosclerotic heart disease of native coronary artery without angina pectoris: Secondary | ICD-10-CM | POA: Diagnosis not present

## 2017-12-18 DIAGNOSIS — G4733 Obstructive sleep apnea (adult) (pediatric): Secondary | ICD-10-CM

## 2017-12-18 DIAGNOSIS — I4819 Other persistent atrial fibrillation: Secondary | ICD-10-CM

## 2017-12-18 DIAGNOSIS — J849 Interstitial pulmonary disease, unspecified: Secondary | ICD-10-CM | POA: Diagnosis not present

## 2017-12-18 DIAGNOSIS — I10 Essential (primary) hypertension: Secondary | ICD-10-CM

## 2017-12-18 DIAGNOSIS — Z9861 Coronary angioplasty status: Secondary | ICD-10-CM | POA: Diagnosis not present

## 2017-12-18 DIAGNOSIS — I481 Persistent atrial fibrillation: Secondary | ICD-10-CM

## 2017-12-18 DIAGNOSIS — I5032 Chronic diastolic (congestive) heart failure: Secondary | ICD-10-CM | POA: Diagnosis not present

## 2017-12-18 DIAGNOSIS — E785 Hyperlipidemia, unspecified: Secondary | ICD-10-CM

## 2017-12-18 DIAGNOSIS — I25119 Atherosclerotic heart disease of native coronary artery with unspecified angina pectoris: Secondary | ICD-10-CM | POA: Diagnosis not present

## 2017-12-18 DIAGNOSIS — R0609 Other forms of dyspnea: Secondary | ICD-10-CM | POA: Diagnosis not present

## 2017-12-18 NOTE — Progress Notes (Signed)
PCP: [facility], [facility] Note: Chief Complaint  Patient presents with  . Follow-up  . Shortness of Breath    all the time  . Coronary Artery Disease  . Atrial Fibrillation    s/p ablation    HPI: Kenneth [name] is a 72 y.o. male with a PMH below (Notably  CAD & h/o Afib s/p ablation & ILD)  who presents today for Annual f/u.  [name] was last seen by me back in Aug 2018 after Negative Myoview in July.  f/u lipid panel --> changed to rosuvastatin. No recurrent angina - just fatigue & DOE along with baseno sustained Afib/Flutter confirmed, not on DOAC.line dyspnea. Not "bopuncoing back" like we hoped.    -- Just seen in Afib Clinic for palpitations - notably worse while on Abx for UTI - better since stopping --> Monitor ordered (EKG showed S. Arrhythmia) ; Chads2vasc score is 4 - but with   Recent Hospitalizations: Significant issues with nephrolithiasis & UTI  10/16/2017: Cystoscopy (for nephrolithiasis)  7/22 - R ureteral stone (UTI)   Studies Personally Reviewed - (if available, images/films reviewed: From Epic Chart or Care Everywhere)  None  Interval History: [name] returns today for annual f/u a bit fatigued due to his on-going struggles with kidney stones - procedures & Abx.  Up until the initial set-back, he & his wife were walking up to 1 mile a day without notable DOE - but now only able to go about 1/2 mile before haing to stop due to DOE & fatigue.  (has been relatively inactive for a few months).  He denies any chest pain or pressure with exertion.  Notes that his palpitations have really improved since stopping Abx.  No syncope or near syncope, TIA/amaurosis fugax.  No PND or orthopnea with minimal LE edema.  No claudication. + easy bruising.  ROS: A comprehensive was performed. Review of Systems  Constitutional: Positive for malaise/fatigue (less energy than usual; exercise intolerance) and weight loss (intentional). Negative for chills and fever.    HENT: Negative for congestion and nosebleeds.   Respiratory: Positive for shortness of breath (at rest == ILD). Negative for cough and wheezing.   Gastrointestinal: Negative for blood in stool and constipation.  Genitourinary: Positive for hematuria (with nephrolithiasis - but not currently).  Musculoskeletal: Negative for myalgias.  Neurological: Positive for headaches (off & on). Negative for dizziness.  Endo/Heme/Allergies: Bruises/bleeds easily.   I have reviewed and (if needed) personally updated the patient's problem list, medications, allergies, past medical and surgical history, social and family history.   Past Medical History:  Diagnosis Date  . Arthritis   . CAD S/P percutaneous coronary angioplasty cardiologist-  dr Ellyn Hack    06/28/16 --PCI with Synergy DES 2.75 mm 20 mm-->m RCA; 07/10/16 PCI with Synergy DES 3.5 mm x 20 mm-->mLAD  . Diverticulosis of colon   . Dyspnea on exertion   . Epiretinal membrane    "epiretinal attachment" (06/28/2016) 10-14-2017 per pt oringinally in both , now only unilateral   . Frequency of urination   . GERD (gastroesophageal reflux disease)   . Heart murmur   . Hiatal hernia   . History of adenomatous polyp of colon   . History of concussion 08/23/2017   w/o loc--- per pt no residual  . History of kidney stones   . History of rheumatic fever 1958  . History of skin cancer    excision leg;  froze the left arm--- unsure BCC or SCC   .  Hyperlipidemia   . Hypertension   . ILD (interstitial lung disease) ([place])    pulmologist-  dr Elsworth Soho--  secondary to methotrexate use --- last PFTs 04-25-2015  mild restriction  . Iron deficiency anemia   . Paroxysmal atrial fibrillation [facility]) cardiologist-  dr Serenna Deroy/  EP-- dr allred   a. 11/2003 Tikosyn initiated - subsequently d/c'd;  b. [place]ib @ [facility];  c. 09/2011 Echo: EF 60-65%, Gr 2 DD;  d. DCCV 10/2011 , 04/2012, 12/ 2014, & 02/ 2015;  e. RFCA 01-13-2014  . Psoriasis    rheumatologist and  dermologist at [facility] --  dx 1969  . Right ureteral stone   . S/P drug eluting coronary stent placement    06-28-2016  x1 to mRCA;  07-10-2016 x1 to mLAD  . Seasonal allergies   . Type 2 diabetes mellitus ([place])   . Urgency of urination   . Wears hearing aid in both ears     Past Surgical History:  Procedure Laterality Date  . APPENDECTOMY  1996  . ATRIAL FIBRILLATION ABLATION N/A 01/13/2014   repeat PVI, also left atrial ablation performed with successfull ablation of LA flutter by Dr Rayann Heman  . ATRIAL FIBRILLATION ABLATION  08/2004   Dr Rolland Porter at [facility] [place])  . CARDIAC CATHETERIZATION  06-30-2003  dr Lia Foyer   no significant coronary obstruction, well preserved LVF, recurrent afib  . CARDIOVASCULAR STRESS TEST  10-22-2016    dr Hawraa Stambaugh   Low risk nuclear study w/ no ischemia/  normal LV function and wall motion ,  nuclear stress ef 57%  . CARDIOVERSION N/A 05/22/2013   Procedure: CARDIOVERSION;  Surgeon: [name], MD;  Location: [facility];  Service: Cardiovascular;  Laterality: N/A;  . CARDIOVERSION N/A 10/23/2011   Procedure: CARDIOVERSION;  Surgeon: Deboraha Sprang, MD;  Location: [facility] CATH LAB;  Service: Cardiovascular;  Laterality: N/A;  . CARDIOVERSION N/A 05/01/2012   Procedure: CARDIOVERSION;  Surgeon: Deboraha Sprang, MD;  Location: [facility] CATH LAB;  Service: Cardiovascular;  Laterality: N/A;  . CARPAL TUNNEL RELEASE Right 1980s  . CATARACT EXTRACTION W/ INTRAOCULAR LENS  IMPLANT, BILATERAL Bilateral   . CATARACT EXTRACTION W/ INTRAOCULAR LENS  IMPLANT, BILATERAL  2012  approx.  . CORONARY STENT INTERVENTION N/A 06/28/2016   Procedure: Coronary Stent Intervention;  Surgeon: Leonie Man, MD;  Location: [facility] CV LAB;  Service: Cardiovascular;  Laterality: mRCA PCI -Synergy DES 2.75 m x 20 mm (3.1 mm)  . CORONARY STENT INTERVENTION N/A 07/10/2016   Procedure: Coronary Stent Intervention;  Surgeon: Leonie Man, MD;  Location: [facility] CV LAB;  Service:  Cardiovascular: mLAD PCI Synergy DES 3.5 mm x 20 mm)  . CYSTOSCOPY W/ STONE MANIPULATION  2010   Patient states he has had several kidney stones up until aroune 2010, none since (06/28/2016)  . CYSTOSCOPY WITH RETROGRADE PYELOGRAM, URETEROSCOPY AND STENT PLACEMENT Right 10/16/2017   Procedure: CYSTOSCOPY WITH RIGHT RETROGRADE RIGHT URETEROSCOPY AND STONE BASKET EXTRACTION;  Surgeon: Irine Seal, MD;  Location: [facility];  Service: Urology;  Laterality: Right;  . CYSTOSCOPY/RETROGRADE/URETEROSCOPY/STONE EXTRACTION WITH BASKET Right 10/27/2017   Procedure: CYSTOSCOPY/RETROGRADE/URETEROSCOPY/STONE EXTRACTION WITH BASKET/URETERAL STENT PLACEMENT and laser;  Surgeon: Irine Seal, MD;  Location: [facility];  Service: Urology;  Laterality: Right;  . HOLMIUM LASER APPLICATION Right 0/96/0454   Procedure: POSSIBLE HOLMIUM LASER APPLICATION;  Surgeon: Irine Seal, MD;  Location: [facility];  Service: Urology;  Laterality: Right;  . INTRAVASCULAR PRESSURE WIRE/FFR STUDY  N/A 06/28/2016   Procedure: Intravascular Pressure Wire/FFR Study;  Surgeon: Leonie Man, MD;  Location: [facility] CV LAB;  Service: Cardiovascular: FFR of mLAD ~65% lesion = 0.75 post --> Staged PCI  . NASAL SINUS SURGERY     x 2  . PROXIMAL INTERPHALANGEAL FUSION (PIP) Left 01-05-2001    dr graves   correction clawtoe and extensor tendon lengthening  . RIGHT/LEFT HEART CATH AND CORONARY ANGIOGRAPHY N/A 06/28/2016   Procedure: Right/Left Heart Cath and Coronary Angiography;  Surgeon: Leonie Man, MD;  Location: [facility] CV LAB;  Service: Cardiovascular: mRCA 99% (TIMI 2), mLAD ~65% (FFR 0.75), OM3 55%. mild Pulm HTN. Mod LVEDP elevation. EF 55-65%.  Marland Kitchen ROTATOR CUFF REPAIR Bilateral last one 2014  . TONSILLECTOMY  age 43  . TRANSTHORACIC ECHOCARDIOGRAM  11/15/2013   ef 65-70%,  due to Afib unable to evaluate LVDF/  AV sclerosis without stenosis or regurg./  MV calcification without stenosis or regurg/  ascending aorta mild  dilated, 30mm) ,  mild to moderate LAE/  mild Tr/  RVSP 68mmHg  . URETEROSCOPIC LASER LITHOTRIPSY STONE EXTRACTIONS/  STENT PLACEMENT Bilateral 09-10-2007   dr Jeffie Pollock  [facility]   per pt has had several prior ureteroscopic stone extraction since age 63 , last one 09-10-2007    Current Meds  Medication Sig  . acetaminophen (TYLENOL) 500 MG tablet Take 1,000 mg by mouth every 6 (six) hours as needed (for pain.).  Marland Kitchen acitretin (SORIATANE) 10 MG capsule Take 10 mg by mouth daily after breakfast.  . aspirin 81 MG chewable tablet CHEW AND SWALLOW 1 TABLET BY MOUTH ONCE DAILY  . Biotin 1000 MCG tablet Take 1,000 mcg by mouth at bedtime.  . calcipotriene (DOVONOX) 0.005 % cream Apply 1 application topically 2 (two) times daily as needed (for psoriasis.).  Marland Kitchen cetirizine (ZYRTEC) 10 MG tablet Take 10 mg by mouth every morning.   . clobetasol cream (TEMOVATE) 6.06 % Apply 1 application topically 2 (two) times daily as needed (for psoriasis).  . clopidogrel (PLAVIX) 75 MG tablet TAKE 1 TABLET BY MOUTH ONCE DAILY WITH BREAKFAST  . diltiazem (CARDIZEM CD) 180 MG 24 hr capsule Take 1 capsule (180 mg total) by mouth every evening.  . ferrous sulfate 325 (65 FE) MG EC tablet Take 325 mg by mouth daily with breakfast.  . Flurandrenolide (CORDRAN) 4 MCG/SQCM TAPE Apply 1 each topically at bedtime. Applied to areas affected by psoriasis.  Marland Kitchen gabapentin (NEURONTIN) 300 MG capsule Take 300 mg by mouth 2 (two) times daily.   . meloxicam (MOBIC) 15 MG tablet Take 15 mg by mouth daily as needed for pain.  . metFORMIN (GLUCOPHAGE) 500 MG tablet Take by mouth 2 (two) times daily with a meal.   . nitroGLYCERIN (NITROSTAT) 0.4 MG SL tablet Place 1 tablet (0.4 mg total) under the tongue every 5 (five) minutes as needed for chest pain.  . pantoprazole (PROTONIX) 40 MG tablet TAKE 1 TABLET BY MOUTH ONCE DAILY (Patient taking differently: TAKE 1 TABLET BY MOUTH ONCE DAILY PRN)  . Polyethyl Glycol-Propyl Glycol (SYSTANE) 0.4-0.3 % GEL  ophthalmic gel Place 1 application into both eyes daily as needed.   . predniSONE (DELTASONE) 5 MG tablet TAKE 1 TABLET BY MOUTH ONCE DAILY WITH BREAKFAST  . Probiotic Product (PHILLIPS COLON HEALTH PO) Take 1 tablet by mouth daily.  . Secukinumab (COSENTYX 300 DOSE [place]) Inject into the skin every 30 (thirty) days.  Marland Kitchen zolpidem (AMBIEN) 10 MG tablet Take 5-10 mg by  mouth at bedtime as needed for sleep.    Allergies  Allergen Reactions  . Hydrocodone Shortness Of Breath    In combination with decongestants  . Other Palpitations and Other (See Comments)    ALL DECONGESTANTS - CAUSE PALPITATIONS; THROWS HEART RHYTHM OUT OF BALANCE  . Oxycodone Shortness Of Breath  . Pseudoephedrine Palpitations  . Tramadol Shortness Of Breath and Rash    flushing    Social History   Tobacco Use  . Smoking status: Former Smoker    Years: 10.00    Types: Cigars    Last attempt to quit: 09/29/1997    Years since quitting: 20.2  . Smokeless tobacco: Former Systems developer    Types: Snuff, Chew  Substance Use Topics  . Alcohol use: No  . Drug use: No   Social History   Social History Narrative   Pt lives in [place] with spouse.     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    family history includes Breast cancer in his mother; Colon cancer in his mother; Coronary artery disease in his mother and paternal grandfather; Heart disease in his maternal grandmother; Stroke in his paternal grandfather.  Wt Readings from Last 3 Encounters:  12/18/17 248 lb 6.4 oz (112.7 kg)  12/16/17 250 lb (113.4 kg)  11/10/17 244 lb (110.7 kg)  11/2016 - 259 lb  PHYSICAL EXAM BP 138/82   Pulse 83   Ht 6\' 3"  (1.905 m)   Wt 248 lb 6.4 oz (112.7 kg)   BMI 31.05 kg/m  Physical Exam  Constitutional: He is oriented to person, place, and time. He appears well-developed and well-nourished. No distress.  Healthy appearing. Well groomed  HENT:  Head: Normocephalic and atraumatic.  Neck: Normal range of motion. Neck  supple. No hepatojugular reflux and no JVD present. Carotid bruit is not present.  Cardiovascular: Normal rate, regular rhythm, normal heart sounds, intact distal pulses and normal pulses.  Occasional extrasystoles are present. PMI is not displaced. Exam reveals no gallop and no friction rub.  No murmur heard. Pulmonary/Chest: Effort normal. No respiratory distress. He has no wheezes. He has no rales (Bibasilar crackles - R>L (from ILD)). He exhibits no tenderness.  Abdominal: Soft. Bowel sounds are normal.  Musculoskeletal: Normal range of motion. He exhibits no edema.  Neurological: He is alert and oriented to person, place, and time.  Psychiatric: He has a normal mood and affect. His behavior is normal. Judgment and thought content normal.  Vitals reviewed.   Adult ECG Report  Rate: 83 ;  Rhythm: normal sinus rhythm; normal axis, intervals & durations  Narrative Interpretation: normal EKg  Other studies Reviewed: Additional studies/ records that were reviewed today include:  Recent Labs:   Lab Results  Component Value Date   CHOL 174 05/12/2017   HDL 49 05/12/2017   LDLCALC 86 05/12/2017   TRIG 197 (H) 05/12/2017   CHOLHDL 3.6 05/12/2017   Lab Results  Component Value Date   WBC 20.5 (H) 10/29/2017   HGB 12.6 (L) 10/29/2017   HCT 37.4 (L) 10/29/2017   MCV 88.0 10/29/2017   PLT 202 10/29/2017     ASSESSMENT / PLAN: Problem List Items Addressed This Visit    Atrial fibrillation (HCC) (Chronic)    S/p Ablation - no documented recurrence --> currently on monitor. Despite CHA2DS2Vasc = 4, no DOAC unless documented recurrence.      Relevant Orders   EKG 12-Lead (Completed)   CAD S/P percutaneous coronary angioplasty (Chronic)    >  1 yr from PCI - remains on clopidogrel.  OK to stop ASA. On Diltiazem over Beta Blocker due to fatigue. Negative Myoview post PCI. No active angina.  With new drop in exercise tolerance /DOE - suspect that this is related to deconditioning  related to recent health issues. However, if this does not progressively improve, low threshold for recheck Myoview to exclude ischemia       Coronary artery disease involving native coronary artery with angina pectoris ([place]) - Primary (Chronic)    Had been doing well up until his recent events with UTI & Nephrolithiasis -- a bit deconditioned - but concerning with Chest Tightness episode & DOE @ 1/2 mile.   Monitor as he gets back into execise -- low threshold for ST vs. Cath.  Stable meds CCB over Beta blocker b/c fatigue.      Relevant Orders   EKG 12-Lead (Completed)   Diastolic CHF, chronic (HCC) (Chronic)    Elevated LVEDP on Cath with normal LVEF.  No PND or orthopnea (mild edema related to venous stasis) Euvolemic on exam  -- Not on diuretic  Consider converting DM Rx to [facility] for DHF benefit      Relevant Orders   EKG 12-Lead (Completed)   Essential hypertension (Chronic)    Borderline BP today on diltiazem alone.   Low threshold for starting ARB for improved control & afterload reduction. (choose ARB> ACE-I to avoid pulmonary side effects).      Exertional dyspnea (Chronic)    Multifactorial -- I suspect that his recent decline in functional status is related to ongoing illness with nephrolithiasis, UTI, etc. Complicated by ILD & some Diastolic dysfunction.  Low threshold to recheck Myoview to exclude ischemia.       Relevant Orders   EKG 12-Lead (Completed)   Hyperlipidemia with target low density lipoprotein (LDL) cholesterol less than 70 mg/dL (Chronic)    Not @ goal LDL as of Feb on rosuvastatin -- Due for f/u labs soon - may need to increase dose to 40 mg. If no labs available @ return visit - will check FLP.      ILD (interstitial lung disease) (HCC) (Chronic)   Obstructive sleep apnea    Monitored by Dr. Elsworth Soho (Pulm).         I spent a total of 67minutes with the patient and chart review. >  50% of the time was spent in direct patient  consultation.   Current medicines are reviewed at length with the patient today.  (+/- concerns) n/a The following changes have been made:  none  Patient Instructions  NO MEDICATION CHANGES  CONTACT OFFICE IF  YOU CONTINUE TO FREQUENTLY HAVE CHEST DISCOMFORT BEFORE  NEXT APPOINTMENT.  Your physician recommends that you schedule a follow-up appointment in Feb 12, 2018 AT 9:40 AM     If you need a refill on your cardiac medications before your next appointment, please call your pharmacy.      Studies Ordered:   Orders Placed This Encounter  Procedures  . EKG 12-Lead      [name], M.D., M.S. Interventional Cardiologist   Pager # [phone] Phone # [phone] [address] [place], [place] [address]   Thank you for choosing Heartcare at [facility]!!

## 2017-12-18 NOTE — Patient Instructions (Addendum)
NO MEDICATION CHANGES  CONTACT OFFICE IF  YOU CONTINUE TO FREQUENTLY HAVE CHEST DISCOMFORT BEFORE  NEXT APPOINTMENT.  Your physician recommends that you schedule a follow-up appointment in Feb 12, 2018 AT 9:40 AM     If you need a refill on your cardiac medications before your next appointment, please call your pharmacy.

## 2017-12-18 NOTE — Assessment & Plan Note (Signed)
Had been doing well up until his recent events with UTI & Nephrolithiasis -- a bit deconditioned - but concerning with Chest Tightness episode & DOE @ 1/2 mile.   Monitor as he gets back into execise -- low threshold for ST vs. Cath.  Stable meds CCB over Beta blocker b/c fatigue.

## 2017-12-20 ENCOUNTER — Encounter: Payer: Self-pay | Admitting: Cardiology

## 2017-12-20 NOTE — Assessment & Plan Note (Signed)
Borderline BP today on diltiazem alone.   Low threshold for starting ARB for improved control & afterload reduction. (choose ARB> ACE-I to avoid pulmonary side effects).

## 2017-12-20 NOTE — Assessment & Plan Note (Signed)
Not @ goal LDL as of Feb on rosuvastatin -- Due for f/u labs soon - may need to increase dose to 40 mg. If no labs available @ return visit - will check FLP.

## 2017-12-20 NOTE — Assessment & Plan Note (Signed)
Elevated LVEDP on Cath with normal LVEF.  No PND or orthopnea (mild edema related to venous stasis) Euvolemic on exam  -- Not on diuretic  Consider converting DM Rx to [facility] for DHF benefit

## 2017-12-20 NOTE — Assessment & Plan Note (Signed)
>   1 yr from PCI - remains on clopidogrel.  OK to stop ASA. On Diltiazem over Beta Blocker due to fatigue. Negative Myoview post PCI. No active angina.  With new drop in exercise tolerance /DOE - suspect that this is related to deconditioning related to recent health issues. However, if this does not progressively improve, low threshold for recheck Myoview to exclude ischemia

## 2017-12-20 NOTE — Assessment & Plan Note (Signed)
Monitored by Dr. Elsworth Soho (Pulm).

## 2017-12-20 NOTE — Assessment & Plan Note (Signed)
S/p Ablation - no documented recurrence --> currently on monitor. Despite CHA2DS2Vasc = 4, no DOAC unless documented recurrence.

## 2017-12-20 NOTE — Assessment & Plan Note (Signed)
Multifactorial -- I suspect that his recent decline in functional status is related to ongoing illness with nephrolithiasis, UTI, etc. Complicated by ILD & some Diastolic dysfunction.  Low threshold to recheck Myoview to exclude ischemia.

## 2018-01-02 DIAGNOSIS — R002 Palpitations: Secondary | ICD-10-CM | POA: Diagnosis not present

## 2018-01-03 DIAGNOSIS — Z23 Encounter for immunization: Secondary | ICD-10-CM | POA: Diagnosis not present

## 2018-01-20 ENCOUNTER — Encounter (HOSPITAL_COMMUNITY): Payer: Self-pay | Admitting: *Deleted

## 2018-02-04 DIAGNOSIS — R3121 Asymptomatic microscopic hematuria: Secondary | ICD-10-CM | POA: Diagnosis not present

## 2018-02-04 DIAGNOSIS — N200 Calculus of kidney: Secondary | ICD-10-CM | POA: Diagnosis not present

## 2018-02-09 ENCOUNTER — Ambulatory Visit (INDEPENDENT_AMBULATORY_CARE_PROVIDER_SITE_OTHER): Payer: Medicare Other | Admitting: Pulmonary Disease

## 2018-02-09 ENCOUNTER — Encounter: Payer: Self-pay | Admitting: Pulmonary Disease

## 2018-02-09 VITALS — BP 122/58 | HR 80 | Temp 98.4°F | Ht 75.0 in | Wt 253.4 lb

## 2018-02-09 DIAGNOSIS — J849 Interstitial pulmonary disease, unspecified: Secondary | ICD-10-CM

## 2018-02-09 MED ORDER — PREDNISONE 10 MG PO TABS: ORAL_TABLET | ORAL | 0 refills | Status: DC

## 2018-02-09 MED ORDER — AZITHROMYCIN 250 MG PO TABS: ORAL_TABLET | ORAL | 0 refills | Status: DC

## 2018-02-09 NOTE — Patient Instructions (Addendum)
Prednisone 10mg  tablet  >>>Take 2 tablets (20 mg total) daily for the next 7 days, Take 1 tablet (10mg ) daily for 7 days, then resume 5mg  daily  >>> Take with food in the morning  Prescription for Antibiotic for travel:  Azithromycin 250mg  tablet  >>>Take 2 tablets (500mg  total) today, and then 1 tablet (250mg ) for the next four days  >>>take with food  >>>can also take probiotic and / or yogurt while on antibiotic   Contact our office in 2 weeks to let us know how you are doing    November/2019 we will be moving! We will no longer be at our [place] location.  Be on the look out for a post card/mailer to let you know we have officially moved.  Our new address and phone number will be:  [address] [place], [place] [address] Telephone number: [phone]  It is flu season:   >>>Remember to be washing your hands regularly, using hand sanitizer, be careful to use around herself with has contact with people who are sick will increase her chances of getting sick yourself. >>> Best ways to protect herself from the flu: Receive the yearly flu vaccine, practice good hand hygiene washing with soap and also using hand sanitizer when available, eat a nutritious meals, get adequate rest, hydrate appropriately   Please contact the office if your symptoms worsen or you have concerns that you are not improving.   Thank you for choosing [facility] for your healthcare, and for allowing [place] to partner with you on your healthcare journey. I am thankful to be able to provide care to you today.   Wyn Quaker FNP-C

## 2018-02-09 NOTE — Progress Notes (Signed)
@Patient  ID: Kenneth [name], male    DOB: [birth year], 72 y.o.   MRN: [id number]  Chief Complaint  Patient presents with  . Acute Visit    Cough    Referring provider: [facility]  HPI:  72 year old male remote smoker (cigars) followed in our office for ILD which favors NSIP.  PMH: Psoriasis, A. fib Smoker/ Smoking History: Remote Smoker - Cigars  Maintenance:  5mg  prednisone Pt of: Elsworth Soho   02/09/2018  - Visit   71 year old male patient following up with our office today for an acute visit.  Patient reports that 2 days ago he had increased cough.  This cough has been nonproductive.  Patient reports that last time he started to develop the symptoms symptoms worsened and he required an increase in his prednisone.  Patient reports additional symptoms such as increased shortness of breath, fatigue, occasional dizziness.  Patient denies other symptoms such as fever, chills, wheezing.  Patient is traveling to [place] later on this month for 2 weeks and wants to ensure that he is doing well and stable for that trip.  Patient also reporting occasional bouts of dizziness with position such as when he stands too quickly or bends over.     Tests:  05/27/2017-CT chest high-res- compatible with nonspecific interstitial pneumonia, several new small 2 to 4 mm pulmonary nodules >>>noncontrast chest CT could be considered in 12 months patient is high risk, dilated pulmonary trunk concerning for PAH  11/15/2013-echocardiogram-LV ejection fraction 65 to 70%, mild concentric hypertrophy, PAP pressure 35  FENO:  No results found for: NITRICOXIDE  PFT: PFT Results Latest Ref Rng & Units 04/25/2015  FVC-Pre L 3.07  FVC-Predicted Pre % 60  FVC-Post L 3.10  FVC-Predicted Post % 61  Pre FEV1/FVC % % 83  Post FEV1/FCV % % 83  FEV1-Pre L 2.54  FEV1-Predicted Pre % 68  DLCO UNC% % 67  DLCO COR %Predicted % 106  TLC L 5.26  TLC % Predicted % 68  RV % Predicted % 27    Imaging: No  results found.  Chart Review:    Specialty Problems      Pulmonary Problems   Allergic rhinitis    Qualifier: Diagnosis of  By: Doy Mince LPN, Megan        UNSPEC ALVEOLAR&PARIETOALVEOLAR PNEUMONOPATHY    Noted in 09/2007-resolved       Obstructive sleep apnea           OTHER CHRONIC SINUSITIS    Qualifier: Diagnosis of  By: Linna Darner MD, Gwyndolyn Saxon        ILD (interstitial lung disease) ([place])    Favor NSIP ?related to methotrexate vs hypersensitivity pneumonitis Appears to be somewhat steroid responsive      Exertional dyspnea      Allergies  Allergen Reactions  . Hydrocodone Shortness Of Breath    In combination with decongestants  . Other Palpitations and Other (See Comments)    ALL DECONGESTANTS - CAUSE PALPITATIONS; THROWS HEART RHYTHM OUT OF BALANCE  . Oxycodone Shortness Of Breath  . Pseudoephedrine Palpitations  . Tramadol Shortness Of Breath and Rash    flushing    Immunization History  Administered Date(s) Administered  . H1N1 03/18/2008  . Influenza Split 03/27/2011  . Influenza Whole 02/24/2008, 01/12/2009  . Influenza, High Dose Seasonal PF 01/15/2016, 12/30/2016, 01/26/2018  . Influenza,inj,Quad PF,6+ Mos 01/03/2014  . Influenza-Unspecified 01/23/2015  . Pneumococcal-Unspecified 01/15/2016    Past Medical History:  Diagnosis Date  . Arthritis   .  CAD S/P percutaneous coronary angioplasty cardiologist-  dr Ellyn Hack    06/28/16 --PCI with Synergy DES 2.75 mm 20 mm-->m RCA; 07/10/16 PCI with Synergy DES 3.5 mm x 20 mm-->mLAD  . Diverticulosis of colon   . Dyspnea on exertion   . Epiretinal membrane    "epiretinal attachment" (06/28/2016) 10-14-2017 per pt oringinally in both , now only unilateral   . Frequency of urination   . GERD (gastroesophageal reflux disease)   . Heart murmur   . Hiatal hernia   . History of adenomatous polyp of colon   . History of concussion 08/23/2017   w/o loc--- per pt no residual  . History of kidney stones   .  History of rheumatic fever 1958  . History of skin cancer    excision leg;  froze the left arm--- unsure BCC or SCC   . Hyperlipidemia   . Hypertension   . ILD (interstitial lung disease) ([place])    pulmologist-  dr Elsworth Soho--  secondary to methotrexate use --- last PFTs 04-25-2015  mild restriction  . Iron deficiency anemia   . Paroxysmal atrial fibrillation [facility]) cardiologist-  dr harding/  EP-- dr allred   a. 11/2003 Tikosyn initiated - subsequently d/c'd;  b. 2006 [place] for Afib @ [facility];  c. 09/2011 Echo: EF 60-65%, Gr 2 DD;  d. DCCV 10/2011 , 04/2012, 12/ 2014, & 02/ 2015;  e. RFCA 01-13-2014  . Psoriasis    rheumatologist and dermologist at [facility] --  dx 1969  . Right ureteral stone   . S/P drug eluting coronary stent placement    06-28-2016  x1 to mRCA;  07-10-2016 x1 to mLAD  . Seasonal allergies   . Type 2 diabetes mellitus ([place])   . Urgency of urination   . Wears hearing aid in both ears     Tobacco History: Social History   Tobacco Use  Smoking Status Former Smoker  . Years: 10.00  . Types: Cigars  . Last attempt to quit: 09/29/1997  . Years since quitting: 20.3  Smokeless Tobacco Former Systems developer  . Types: Snuff, Chew   Counseling given: Yes  Continue to not smoke   Outpatient Encounter Medications as of 02/09/2018  Medication Sig  . acetaminophen (TYLENOL) 500 MG tablet Take 1,000 mg by mouth every 6 (six) hours as needed (for pain.).  Marland Kitchen acitretin (SORIATANE) 10 MG capsule Take 10 mg by mouth daily after breakfast.  . aspirin 81 MG chewable tablet CHEW AND SWALLOW 1 TABLET BY MOUTH ONCE DAILY  . Biotin 1000 MCG tablet Take 1,000 mcg by mouth at bedtime.  . calcipotriene (DOVONOX) 0.005 % cream Apply 1 application topically 2 (two) times daily as needed (for psoriasis.).  Marland Kitchen cetirizine (ZYRTEC) 10 MG tablet Take 10 mg by mouth every morning.   . clobetasol cream (TEMOVATE) 7.16 % Apply 1 application topically 2 (two) times daily as needed (for psoriasis).  .  clopidogrel (PLAVIX) 75 MG tablet TAKE 1 TABLET BY MOUTH ONCE DAILY WITH BREAKFAST  . diltiazem (CARDIZEM CD) 180 MG 24 hr capsule Take 1 capsule (180 mg total) by mouth every evening.  . ferrous sulfate 325 (65 FE) MG EC tablet Take 325 mg by mouth daily with breakfast.  . Flurandrenolide (CORDRAN) 4 MCG/SQCM TAPE Apply 1 each topically at bedtime. Applied to areas affected by psoriasis.  Marland Kitchen gabapentin (NEURONTIN) 300 MG capsule Take 300 mg by mouth 2 (two) times daily.   . metFORMIN (GLUCOPHAGE) 500 MG tablet Take by mouth 2 (  two) times daily with a meal.   . nitroGLYCERIN (NITROSTAT) 0.4 MG SL tablet Place 1 tablet (0.4 mg total) under the tongue every 5 (five) minutes as needed for chest pain.  . pantoprazole (PROTONIX) 40 MG tablet TAKE 1 TABLET BY MOUTH ONCE DAILY (Patient taking differently: TAKE 1 TABLET BY MOUTH ONCE DAILY PRN)  . Polyethyl Glycol-Propyl Glycol (SYSTANE) 0.4-0.3 % GEL ophthalmic gel Place 1 application into both eyes daily as needed.   . predniSONE (DELTASONE) 5 MG tablet TAKE 1 TABLET BY MOUTH ONCE DAILY WITH BREAKFAST  . Probiotic Product (PHILLIPS COLON HEALTH PO) Take 1 tablet by mouth daily.  . Secukinumab (COSENTYX 300 DOSE [place]) Inject into the skin every 30 (thirty) days.  Marland Kitchen azithromycin (ZITHROMAX) 250 MG tablet 500mg  (two tablets) today, then 250mg  (1 tablet) for the next 4 days  . predniSONE (DELTASONE) 10 MG tablet Take 2 tablets (20mg  total) daily for the next 7days, then 1 tablet (10mg  total) for 7 days, then resume 5mg  dose  . rosuvastatin (CRESTOR) 20 MG tablet Take 1 tablet (20 mg total) by mouth daily. (Patient taking differently: Take 20 mg by mouth every evening. )  . [DISCONTINUED] meloxicam (MOBIC) 15 MG tablet Take 15 mg by mouth daily as needed for pain.  . [DISCONTINUED] zolpidem (AMBIEN) 10 MG tablet Take 5-10 mg by mouth at bedtime as needed for sleep.   Facility-Administered Encounter Medications as of 02/09/2018  Medication  . lidocaine (cardiac)  100 mg/35ml (XYLOCAINE) 20 MG/ML injection 2%  . propofol (DIPRIVAN) 10 mg/mL bolus/IV push     Review of Systems  Review of Systems  Constitutional: Positive for fatigue. Negative for activity change, chills, fever and unexpected weight change.  HENT: Positive for sinus pressure. Negative for congestion, postnasal drip, rhinorrhea, sinus pain, sneezing and sore throat.   Eyes: Negative.   Respiratory: Positive for cough (dry cough, worsens when laying flat ), chest tightness and shortness of breath. Negative for wheezing.   Cardiovascular: Negative for chest pain and palpitations.  Gastrointestinal: Negative for constipation, diarrhea, nausea and vomiting.  Endocrine: Negative.   Musculoskeletal: Negative.   Skin: Negative.   Neurological: Positive for dizziness. Negative for headaches.  Psychiatric/Behavioral: Negative.  Negative for dysphoric mood. The patient is not nervous/anxious.   All other systems reviewed and are negative.    Physical Exam  BP (!) 122/58 (BP Location: Left Arm, Cuff Size: Normal)   Pulse 80   Temp 98.4 F (36.9 C) (Oral)   Ht 6\' 3"  (1.905 m)   Wt 253 lb 6.4 oz (114.9 kg)   SpO2 97%   BMI 31.67 kg/m   Wt Readings from Last 5 Encounters:  02/09/18 253 lb 6.4 oz (114.9 kg)  12/18/17 248 lb 6.4 oz (112.7 kg)  12/16/17 250 lb (113.4 kg)  11/10/17 244 lb (110.7 kg)  10/27/17 243 lb (110.2 kg)     Physical Exam  Constitutional: He is oriented to person, place, and time and well-developed, well-nourished, and in no distress. No distress.  HENT:  Head: Normocephalic and atraumatic.  Right Ear: Hearing, external ear and ear canal normal.  Left Ear: Hearing, external ear and ear canal normal.  Nose: Nose normal. Right sinus exhibits no maxillary sinus tenderness and no frontal sinus tenderness. Left sinus exhibits no maxillary sinus tenderness and no frontal sinus tenderness.  Mouth/Throat: Uvula is midline and oropharynx is clear and moist. No  oropharyngeal exudate.  +TMs with effusion without infection bilaterally.  Eyes: Pupils are equal, round,  and reactive to light.  Neck: Normal range of motion. Neck supple. No JVD present.  Cardiovascular: Normal rate, regular rhythm and normal heart sounds.  Pulmonary/Chest: Effort normal. No accessory muscle usage. No respiratory distress. He has no decreased breath sounds. He has no wheezes. He has no rhonchi. He has rales (BB crackles ).  Abdominal: There is no tenderness.  Musculoskeletal: Normal range of motion.  Lymphadenopathy:    He has no cervical adenopathy.  Neurological: He is alert and oriented to person, place, and time. Gait normal.  Skin: Skin is warm and dry. He is not diaphoretic. No erythema.  Psychiatric: Memory and judgment normal. His mood appears anxious. He has a flat affect.  Nursing note and vitals reviewed.     Lab Results:  CBC    Component Value Date/Time   WBC 20.5 (H) 10/29/2017 0503   RBC 4.25 10/29/2017 0503   HGB 12.6 (L) 10/29/2017 0503   HGB 12.5 (L) 05/12/2017 1130   HCT 37.4 (L) 10/29/2017 0503   HCT 37.3 (L) 05/12/2017 1130   PLT 202 10/29/2017 0503   PLT 242 05/12/2017 1130   MCV 88.0 10/29/2017 0503   MCV 83 05/12/2017 1130   MCH 29.6 10/29/2017 0503   MCHC 33.7 10/29/2017 0503   RDW 13.6 10/29/2017 0503   RDW 15.9 (H) 05/12/2017 1130   LYMPHSABS 1.1 10/29/2017 0503   MONOABS 0.8 10/29/2017 0503   EOSABS 0.0 10/29/2017 0503   BASOSABS 0.0 10/29/2017 0503    BMET    Component Value Date/Time   NA 138 10/29/2017 0503   NA 143 05/12/2017 1130   K 4.2 10/29/2017 0503   CL 105 10/29/2017 0503   CO2 25 10/29/2017 0503   GLUCOSE 201 (H) 10/29/2017 0503   BUN 23 10/29/2017 0503   BUN 14 05/12/2017 1130   CREATININE 0.91 10/29/2017 0503   CALCIUM 9.2 10/29/2017 0503   GFRNONAA >60 10/29/2017 0503   GFRAA >60 10/29/2017 0503    BNP No results found for: BNP  ProBNP    Component Value Date/Time   PROBNP 76.7 09/27/2013  1556      Assessment & Plan:   Pleasant 72 year old male patient seen office today.  We will give patient short course of prednisone.  Will increase to 20 mg temporarily and tapered back down to his 5 mg chronic dose.  Patient to contact our office in 2 weeks let us know how he is doing.  If symptoms are resolving he is doing well could consider prescribing as needed prednisone for his trip if he needs it or has a flare.  Antibiotic provided today for as needed use if he is traveling.  Patient knows to contact our office if he has to start using the antibiotic.  ILD (interstitial lung disease) ([facility]) Prednisone 10mg  tablet  >>>Take 2 tablets (20 mg total) daily for the next 7 days, Take 1 tablet (10mg ) daily for 7 days, then resume 5mg  daily  >>> Take with food in the morning >>>this replaces your 5mg  chronic use  Prescription for Antibiotic for travel:  Azithromycin 250mg  tablet  >>>Take 2 tablets (500mg  total) today, and then 1 tablet (250mg ) for the next four days  >>>take with food  >>>can also take probiotic and / or yogurt while on antibiotic  >>>contact our office if you have to use this   Contact our office in 2 weeks to let us know how you are doing     This appointment was 34 minutes along  with over 50% of the time in direct face-to-face patient care, assessment, plan of care follow-up.  Lauraine Rinne, NP 02/09/2018

## 2018-02-09 NOTE — Assessment & Plan Note (Signed)
Prednisone 10mg  tablet  >>>Take 2 tablets (20 mg total) daily for the next 7 days, Take 1 tablet (10mg ) daily for 7 days, then resume 5mg  daily  >>> Take with food in the morning >>>this replaces your 5mg  chronic use  Prescription for Antibiotic for travel:  Azithromycin 250mg  tablet  >>>Take 2 tablets (500mg  total) today, and then 1 tablet (250mg ) for the next four days  >>>take with food  >>>can also take probiotic and / or yogurt while on antibiotic  >>>contact our office if you have to use this   Contact our office in 2 weeks to let us know how you are doing

## 2018-02-10 DIAGNOSIS — E119 Type 2 diabetes mellitus without complications: Secondary | ICD-10-CM | POA: Diagnosis not present

## 2018-02-10 DIAGNOSIS — Z7984 Long term (current) use of oral hypoglycemic drugs: Secondary | ICD-10-CM | POA: Diagnosis not present

## 2018-02-10 DIAGNOSIS — H0100A Unspecified blepharitis right eye, upper and lower eyelids: Secondary | ICD-10-CM | POA: Diagnosis not present

## 2018-02-10 DIAGNOSIS — H43813 Vitreous degeneration, bilateral: Secondary | ICD-10-CM | POA: Diagnosis not present

## 2018-02-10 DIAGNOSIS — H5203 Hypermetropia, bilateral: Secondary | ICD-10-CM | POA: Diagnosis not present

## 2018-02-10 DIAGNOSIS — H35413 Lattice degeneration of retina, bilateral: Secondary | ICD-10-CM | POA: Diagnosis not present

## 2018-02-10 DIAGNOSIS — H52203 Unspecified astigmatism, bilateral: Secondary | ICD-10-CM | POA: Diagnosis not present

## 2018-02-10 DIAGNOSIS — H11823 Conjunctivochalasis, bilateral: Secondary | ICD-10-CM | POA: Diagnosis not present

## 2018-02-10 DIAGNOSIS — H35373 Puckering of macula, bilateral: Secondary | ICD-10-CM | POA: Diagnosis not present

## 2018-02-10 DIAGNOSIS — Z961 Presence of intraocular lens: Secondary | ICD-10-CM | POA: Diagnosis not present

## 2018-02-10 DIAGNOSIS — H35342 Macular cyst, hole, or pseudohole, left eye: Secondary | ICD-10-CM | POA: Diagnosis not present

## 2018-02-10 DIAGNOSIS — H0100B Unspecified blepharitis left eye, upper and lower eyelids: Secondary | ICD-10-CM | POA: Diagnosis not present

## 2018-02-12 ENCOUNTER — Encounter: Payer: Self-pay | Admitting: Cardiology

## 2018-02-12 ENCOUNTER — Ambulatory Visit (INDEPENDENT_AMBULATORY_CARE_PROVIDER_SITE_OTHER): Payer: Medicare Other | Admitting: Cardiology

## 2018-02-12 VITALS — BP 134/66 | HR 80 | Ht 75.0 in | Wt 254.0 lb

## 2018-02-12 DIAGNOSIS — I484 Atypical atrial flutter: Secondary | ICD-10-CM | POA: Diagnosis not present

## 2018-02-12 DIAGNOSIS — I10 Essential (primary) hypertension: Secondary | ICD-10-CM

## 2018-02-12 DIAGNOSIS — I5032 Chronic diastolic (congestive) heart failure: Secondary | ICD-10-CM

## 2018-02-12 DIAGNOSIS — I4891 Unspecified atrial fibrillation: Secondary | ICD-10-CM | POA: Diagnosis not present

## 2018-02-12 DIAGNOSIS — E785 Hyperlipidemia, unspecified: Secondary | ICD-10-CM | POA: Diagnosis not present

## 2018-02-12 DIAGNOSIS — I25119 Atherosclerotic heart disease of native coronary artery with unspecified angina pectoris: Secondary | ICD-10-CM | POA: Diagnosis not present

## 2018-02-12 DIAGNOSIS — Z9861 Coronary angioplasty status: Secondary | ICD-10-CM

## 2018-02-12 DIAGNOSIS — I471 Supraventricular tachycardia: Secondary | ICD-10-CM

## 2018-02-12 DIAGNOSIS — R0609 Other forms of dyspnea: Secondary | ICD-10-CM

## 2018-02-12 DIAGNOSIS — I2510 Atherosclerotic heart disease of native coronary artery without angina pectoris: Secondary | ICD-10-CM

## 2018-02-12 DIAGNOSIS — I4719 Other supraventricular tachycardia: Secondary | ICD-10-CM | POA: Insufficient documentation

## 2018-02-12 MED ORDER — DILTIAZEM HCL ER COATED BEADS 240 MG PO CP24: 240.0000 mg | ORAL_CAPSULE | Freq: Every day | ORAL | 3 refills | Status: DC

## 2018-02-12 NOTE — Patient Instructions (Signed)
Medication Instructions:  STOP DILTIAZEM 180 MG  --START TAKING DILTIAZEM 240 MG DAILY  If you need a refill on your cardiac medications before your next appointment, please call your pharmacy.   Lab work: NOT NEEDED If you have labs (blood work) drawn today and your tests are completely normal, you will receive your results only by: Marland Kitchen MyChart Message (if you have MyChart) OR . A paper copy in the mail If you have any lab test that is abnormal or we need to change your treatment, we will call you to review the results.  Testing/Procedures: NOT NEEDED  Follow-Up: At [facility], you and your health needs are our priority.  As part of our continuing mission to provide you with exceptional heart care, we have created designated Provider Care Teams.  These Care Teams include your primary Cardiologist (physician) and Advanced Practice Providers (APPs -  Physician Assistants and Nurse Practitioners) who all work together to provide you with the care you need, when you need it. You will need a follow up appointment in 10 months.  Please call our office 2 months in advance to schedule this appointment.  You may see [name], MD or one of the following Advanced Practice Providers on your designated Care Team:   Rosaria Ferries, PA-C . Jory Sims, DNP, ANP  Any Other Special Instructions Will Be Listed Below (If Applicable).

## 2018-02-12 NOTE — Progress Notes (Signed)
PCP: [facility], [facility] Note: Chief Complaint  Patient presents with  . Follow-up    Overall feels much better  . Coronary Artery Disease  . Tachycardia    Atrial tachycardia    HPI: Kenneth [name] is a 72 y.o. male with a PMH below (Notably  CAD & h/o Afib s/p ablation & ILD)  who presents today for close ~1 month f/u.  Kenneth [name] was last seen by me back in Aug 2018 after Negative Myoview in July.  f/u lipid panel --> changed to rosuvastatin. No recurrent angina - just fatigue & DOE along with baseno sustained Afib/Flutter confirmed, not on DOAC.line dyspnea. Not "bopuncoing back" like we hoped.    -- Just seen in Afib Clinic for palpitations - notably worse while on Abx for UTI - better since stopping --> Monitor ordered (EKG showed S. Arrhythmia) ; Chads2vasc score is 4 - but with no recurrent A. fib, is no longer on full anticoagulation.  Recent Hospitalizations: Significant issues with nephrolithiasis & UTI  10/16/2017: Cystoscopy (for nephrolithiasis)  7/22 - R ureteral stone (UTI)   Studies Personally Reviewed - (if available, images/films reviewed: From Epic Chart or Care Everywhere)  None  Interval History: Ethanjames returns today for close follow-up noting that since his urologic procedures are complete he feels much better.  He says he is back walking again up to about a mile a day without exertional dyspnea.  No resting or exertional chest pain or pressure.  A lot of his fatigue and everything that he had last visit was most likely related to his urologic issues. Palpitations have notably improved.  No PND, orthopnea with minimal edema.  No claudication.  Still bruises easily, but no significant bleeding.  No melena, hematochezia or hematuria.  No epistaxis.  Overall doing better.  ROS: A comprehensive was performed. Review of Systems  Constitutional: Positive for malaise/fatigue (exercise intolerance is getting better) and weight loss (intentional).  Negative for chills and fever.  HENT: Negative for congestion and nosebleeds.   Respiratory: Positive for shortness of breath (at rest == ILD.  Stable). Negative for cough and wheezing.   Gastrointestinal: Negative for blood in stool and constipation.  Genitourinary: Negative for hematuria (Resolved).  Musculoskeletal: Negative for myalgias.  Neurological: Positive for headaches (off & on). Negative for dizziness.  Endo/Heme/Allergies: Bruises/bleeds easily.   I have reviewed and (if needed) personally updated the patient's problem list, medications, allergies, past medical and surgical history, social and family history.   Past Medical History:  Diagnosis Date  . Arthritis   . CAD S/P percutaneous coronary angioplasty cardiologist-  dr Ellyn Hack    06/28/16 --PCI with Synergy DES 2.75 mm 20 mm-->m RCA; 07/10/16 PCI with Synergy DES 3.5 mm x 20 mm-->mLAD  . Diverticulosis of colon   . Dyspnea on exertion   . Epiretinal membrane    "epiretinal attachment" (06/28/2016) 10-14-2017 per pt oringinally in both , now only unilateral   . Frequency of urination   . GERD (gastroesophageal reflux disease)   . Heart murmur   . Hiatal hernia   . History of adenomatous polyp of colon   . History of concussion 08/23/2017   w/o loc--- per pt no residual  . History of kidney stones   . History of rheumatic fever 1958  . History of skin cancer    excision leg;  froze the left arm--- unsure BCC or SCC   . Hyperlipidemia   . Hypertension   . ILD (  interstitial lung disease) ([place])    pulmologist-  dr Elsworth Soho--  secondary to methotrexate use --- last PFTs 04-25-2015  mild restriction  . Iron deficiency anemia   . Paroxysmal atrial fibrillation [facility]) cardiologist-  dr harding/  EP-- dr allred   a. 11/2003 Tikosyn initiated - subsequently d/c'd;  b. 2006 [place] for Afib @ [facility];  c. 09/2011 Echo: EF 60-65%, Gr 2 DD;  d. DCCV 10/2011 , 04/2012, 12/ 2014, & 02/ 2015;  e. RFCA 01-13-2014  . Psoriasis    rheumatologist  and dermologist at [facility] --  dx 1969  . Right ureteral stone   . S/P drug eluting coronary stent placement    06-28-2016  x1 to mRCA;  07-10-2016 x1 to mLAD  . Seasonal allergies   . Type 2 diabetes mellitus ([place])   . Urgency of urination   . Wears hearing aid in both ears     Past Surgical History:  Procedure Laterality Date  . APPENDECTOMY  1996  . ATRIAL FIBRILLATION ABLATION N/A 01/13/2014   repeat PVI, also left atrial ablation performed with successfull ablation of LA flutter by Dr Rayann Heman  . ATRIAL FIBRILLATION ABLATION  08/2004   Dr Rolland Porter at [facility] [place])  . CARDIAC CATHETERIZATION  06-30-2003  dr Lia Foyer   no significant coronary obstruction, well preserved LVF, recurrent afib  . CARDIOVASCULAR STRESS TEST  10-22-2016    dr harding   Low risk nuclear study w/ no ischemia/  normal LV function and wall motion ,  nuclear stress ef 57%  . CARDIOVERSION N/A 05/22/2013   Procedure: CARDIOVERSION;  Surgeon: [name], MD;  Location: [facility];  Service: Cardiovascular;  Laterality: N/A;  . CARDIOVERSION N/A 10/23/2011   Procedure: CARDIOVERSION;  Surgeon: Deboraha Sprang, MD;  Location[facility] C[facility] LAB;  Service: Cardiovascular;  Laterality: N/A;  . CARDIOVERSION N/A 05/01/2012   Procedure: CARDIOVERSION;  Surgeon: Deboraha Sprang, MD;  Location: [facility] CATH LAB;  Service: Cardiovascular;  Laterality: N/A;  . CARPAL TUNNEL RELEASE Right 1980s  . CATARACT EXTRACTION W/ INTRAOCULAR LENS  IMPLANT, BILATERAL Bilateral   . CATARACT EXTRACTION W/ INTRAOCULAR LENS  IMPLANT, BILATERAL  2012  approx.  . CORONARY STENT INTERVENTION N/A 06/28/2016   Procedure: Coronary Stent Intervention;  Surgeon: Leonie Man, MD;  Location: [place] CV LAB;  Service: Cardiovascular;  Laterality: mRCA PCI -Synergy DES 2.75 m x 20 mm (3.1 mm)  . CORONARY STENT INTERVENTION N/A 07/10/2016   Procedure: Coronary Stent Intervention;  Surgeon: Leonie Man, MD;  Location: [facility] CV LAB;  Service:  Cardiovascular: mLAD PCI Synergy DES 3.5 mm x 20 mm)  . CYSTOSCOPY W/ STONE MANIPULATION  2010   Patient states he has had several kidney stones up until aroune 2010, none since (06/28/2016)  . CYSTOSCOPY WITH RETROGRADE PYELOGRAM, URETEROSCOPY AND STENT PLACEMENT Right 10/16/2017   Procedure: CYSTOSCOPY WITH RIGHT RETROGRADE RIGHT URETEROSCOPY AND STONE BASKET EXTRACTION;  Surgeon: Irine Seal, MD;  [facility];  Service: Urology;  Laterality: Right;  . CYSTOSCOPY/RETROGRADE/URETEROSCOPY/STONE EXTRACTION WITH BASKET Right 10/27/2017   Procedure: CYSTOSCOPY/RETROGRADE/URETEROSCOPY/STONE EXTRACTION WITH BASKET/URETERAL STENT PLACEMENT and laser;  Surgeon: Irine Seal, MD;  Location: [facility];  Service: Urology;  Laterality: Right;  . HOLMIUM LASER APPLICATION Right 09/02/7822   Procedure: POSSIBLE HOLMIUM LASER APPLICATION;  Surgeon: Irine Seal, MD;  Location: [facility];  Service: Urology;  Laterality: Right;  . INTRAVASCULAR PRESSURE WIRE/FFR STUDY N/A 06/28/2016   Procedure: Intravascular Pressure Wire/FFR Study;  Surgeon: Leonie Man, MD;  Location: [place] CV LAB;  Service: Cardiovascular: FFR of mLAD ~65% lesion = 0.75 post --> Staged PCI  . NASAL SINUS SURGERY     x 2  . PROXIMAL INTERPHALANGEAL FUSION (PIP) Left 01-05-2001    dr graves   correction clawtoe and extensor tendon lengthening  . RIGHT/LEFT HEART CATH AND CORONARY ANGIOGRAPHY N/A 06/28/2016   Procedure: Right/Left Heart Cath and Coronary Angiography;  Surgeon: Leonie Man, MD;  Location: [place] CV LAB;  Service: Cardiovascular: mRCA 99% (TIMI 2), mLAD ~65% (FFR 0.75), OM3 55%. mild Pulm HTN. Mod LVEDP elevation. EF 55-65%.  Marland Kitchen ROTATOR CUFF REPAIR Bilateral last one 2014  . TONSILLECTOMY  age 59  . TRANSTHORACIC ECHOCARDIOGRAM  11/15/2013   ef 65-70%,  due to Afib unable to evaluate LVDF/  AV sclerosis without stenosis or regurg./  MV calcification without stenosis or regurg/  ascending aorta mild  dilated, 12mm) ,  mild to moderate LAE/  mild Tr/  RVSP 24mmHg  . URETEROSCOPIC LASER LITHOTRIPSY STONE EXTRACTIONS/  STENT PLACEMENT Bilateral 09-10-2007   dr Jeffie Pollock  [facility]   per pt has had several prior ureteroscopic stone extraction since age 49 , last one 09-10-2007    Current Meds  Medication Sig  . acetaminophen (TYLENOL) 500 MG tablet Take 1,000 mg by mouth every 6 (six) hours as needed (for pain.).  Marland Kitchen aspirin 81 MG chewable tablet CHEW AND SWALLOW 1 TABLET BY MOUTH ONCE DAILY  . Biotin 1000 MCG tablet Take 1,000 mcg by mouth at bedtime.  . calcipotriene (DOVONOX) 0.005 % cream Apply 1 application topically 2 (two) times daily as needed (for psoriasis.).  Marland Kitchen cetirizine (ZYRTEC) 10 MG tablet Take 10 mg by mouth every morning.   . clobetasol cream (TEMOVATE) 2.45 % Apply 1 application topically 2 (two) times daily as needed (for psoriasis).  . clopidogrel (PLAVIX) 75 MG tablet TAKE 1 TABLET BY MOUTH ONCE DAILY WITH BREAKFAST  . ferrous sulfate 325 (65 FE) MG EC tablet Take 325 mg by mouth daily with breakfast.  . Flurandrenolide (CORDRAN) 4 MCG/SQCM TAPE Apply 1 each topically at bedtime. Applied to areas affected by psoriasis.  Marland Kitchen gabapentin (NEURONTIN) 300 MG capsule Take 300 mg by mouth 2 (two) times daily.   . metFORMIN (GLUCOPHAGE) 500 MG tablet Take by mouth 2 (two) times daily with a meal.   . nitroGLYCERIN (NITROSTAT) 0.4 MG SL tablet Place 1 tablet (0.4 mg total) under the tongue every 5 (five) minutes as needed for chest pain.  . pantoprazole (PROTONIX) 40 MG tablet TAKE 1 TABLET BY MOUTH ONCE DAILY (Patient taking differently: TAKE 1 TABLET BY MOUTH ONCE DAILY PRN)  . Polyethyl Glycol-Propyl Glycol (SYSTANE) 0.4-0.3 % GEL ophthalmic gel Place 1 application into both eyes daily as needed.   . predniSONE (DELTASONE) 10 MG tablet Take 2 tablets (20mg  total) daily for the next 7days, then 1 tablet (10mg  total) for 7 days, then resume 5mg  dose  . predniSONE (DELTASONE) 5 MG tablet TAKE 1  TABLET BY MOUTH ONCE DAILY WITH BREAKFAST  . Probiotic Product (PHILLIPS COLON HEALTH PO) Take 1 tablet by mouth daily.  . rosuvastatin (CRESTOR) 20 MG tablet Take 1 tablet (20 mg total) by mouth daily. (Patient taking differently: Take 20 mg by mouth every evening. )  . [DISCONTINUED] diltiazem (CARDIZEM CD) 180 MG 24 hr capsule Take 1 capsule (180 mg total) by mouth every evening.    Allergies  Allergen Reactions  . Hydrocodone Shortness  Of Breath    In combination with decongestants  . Other Palpitations and Other (See Comments)    ALL DECONGESTANTS - CAUSE PALPITATIONS; THROWS HEART RHYTHM OUT OF BALANCE  . Oxycodone Shortness Of Breath  . Pseudoephedrine Palpitations  . Tramadol Shortness Of Breath and Rash    flushing    Social History   Tobacco Use  . Smoking status: Former Smoker    Years: 10.00    Types: Cigars    Last attempt to quit: 09/29/1997    Years since quitting: 20.3  . Smokeless tobacco: Former Systems developer    Types: Snuff, Chew  Substance Use Topics  . Alcohol use: No  . Drug use: No   Social History   Social History Narrative   Pt lives in [place] with spouse.     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    family history includes Breast cancer in his mother; Colon cancer in his mother; Coronary artery disease in his mother and paternal grandfather; Heart disease in his maternal grandmother; Stroke in his paternal grandfather.  Wt Readings from Last 3 Encounters:  02/12/18 254 lb (115.2 kg)  02/09/18 253 lb 6.4 oz (114.9 kg)  12/18/17 248 lb 6.4 oz (112.7 kg)  11/2016 - 259 lb  PHYSICAL EXAM BP 134/66   Pulse 80   Ht 6\' 3"  (1.905 m)   Wt 254 lb (115.2 kg)   BMI 31.75 kg/m  Physical Exam  Constitutional: He is oriented to person, place, and time. He appears well-developed and well-nourished. No distress.  Healthy appearing. Well groomed  HENT:  Head: Normocephalic and atraumatic.  Neck: Normal range of motion. Neck supple. No  hepatojugular reflux and no JVD present. Carotid bruit is not present.  Cardiovascular: Normal rate, regular rhythm, normal heart sounds, intact distal pulses and normal pulses.  Occasional extrasystoles are present. PMI is not displaced. Exam reveals no gallop and no friction rub.  No murmur heard. Pulmonary/Chest: Effort normal. No respiratory distress. He has no wheezes. He has no rales (Bibasilar crackles - R>L (from ILD)). He exhibits no tenderness.  Abdominal: Soft. Bowel sounds are normal. He exhibits no distension. There is no tenderness. There is no rebound.  No HSM  Musculoskeletal: Normal range of motion. He exhibits no edema.  Neurological: He is alert and oriented to person, place, and time.  Psychiatric: He has a normal mood and affect. His behavior is normal. Judgment and thought content normal.  Vitals reviewed.   Adult ECG Report  Rate: 83 ;  Rhythm: normal sinus rhythm; normal axis, intervals & durations  Narrative Interpretation: normal EKg  Other studies Reviewed: Additional studies/ records that were reviewed today include:  Recent Labs:   Lab Results  Component Value Date   CHOL 174 05/12/2017   HDL 49 05/12/2017   LDLCALC 86 05/12/2017   TRIG 197 (H) 05/12/2017   CHOLHDL 3.6 05/12/2017   Lab Results  Component Value Date   WBC 20.5 (H) 10/29/2017   HGB 12.6 (L) 10/29/2017   HCT 37.4 (L) 10/29/2017   MCV 88.0 10/29/2017   PLT 202 10/29/2017    ASSESSMENT / PLAN: Problem List Items Addressed This Visit    Atrial fibrillation ([place]) (Chronic)    Status post ablation.  Not on full anti-coagulation.  On calcium channel blocker.      Relevant Medications   diltiazem (CARDIZEM CD) 240 MG 24 hr capsule   Atrial tachycardia (HCC)   Relevant Medications   diltiazem (CARDIZEM CD) 240  MG 24 hr capsule   Other Relevant Orders   EKG 12-Lead (Completed)   Atypical atrial flutter (HCC) (Chronic)    Distant history of ablation.  No longer on anticoagulation with  no recurrence.  Monitor showed atrial tachycardia not flutter or fibrillation.      Relevant Medications   diltiazem (CARDIZEM CD) 240 MG 24 hr capsule   CAD S/P percutaneous coronary angioplasty - Primary (Chronic)    Is on aspirin and Plavix.  It is okay to stop aspirin, but he chooses to continue. Most recent Myoview was negative post PCI.  Not longer having any angina symptoms. Okay to hold Plavix for procedures.      Relevant Medications   diltiazem (CARDIZEM CD) 240 MG 24 hr capsule   Other Relevant Orders   EKG 12-Lead (Completed)   Coronary artery disease involving native coronary artery with angina pectoris (HCC) (Chronic)    No further angina symptoms.  Well-controlled with diltiazem is also used for rate control.  (Use diltiazem instead of beta-blocker because of fatigue on beta-blockers).  Chest tightness seems to have resolved.  Remains on aspirin and Plavix along with statin.      Relevant Medications   diltiazem (CARDIZEM CD) 240 MG 24 hr capsule   Other Relevant Orders   EKG 12-Lead (Completed)   Diastolic CHF, chronic (HCC) (Chronic)    Elevated LVEDP on cath and echocardiogram.  Normal EF.  No heart failure symptoms of PND orthopnea with mild lower extremity edema.  I suspect the edema is probably more related to diltiazem and interstitial lung disease.  No diuretic requirement.      Relevant Medications   diltiazem (CARDIZEM CD) 240 MG 24 hr capsule   Essential hypertension (Chronic)    Blood pressure better controlled today with diltiazem.  May need to consider ARB if pressures increase.  (Would choose ARB over ACE inhibitor in order to avoid pulmonary side effects)      Relevant Medications   diltiazem (CARDIZEM CD) 240 MG 24 hr capsule   Exertional dyspnea (Chronic)    Back to baseline.  I suspect this is related to his interstitial lung disease.  Mild component of diastolic dysfunction.  We will hold off on further ischemic evaluation as symptoms have  improved.      Relevant Orders   EKG 12-Lead (Completed)   Hyperlipidemia with target low density lipoprotein (LDL) cholesterol less than 70 mg/dL (Chronic)    In February, his labs are not quite at goal.  PCP is to recheck labs soon. We will follow-up labs.  May need to consider increasing to 40 mg      Relevant Medications   diltiazem (CARDIZEM CD) 240 MG 24 hr capsule      I spent a total of 7minutes with the patient and chart review. >  50% of the time was spent in direct patient consultation.   Current medicines are reviewed at length with the patient today.  (+/- concerns) n/a The following changes have been made:  none  Patient Instructions  Medication Instructions:  STOP DILTIAZEM 180 MG  --START TAKING DILTIAZEM 240 MG DAILY  If you need a refill on your cardiac medications before your next appointment, please call your pharmacy.   Lab work: NOT NEEDED If you have labs (blood work) drawn today and your tests are completely normal, you will receive your results only by: Marland Kitchen MyChart Message (if you have MyChart) OR . A paper copy in the mail If you have  any lab test that is abnormal or we need to change your treatment, we will call you to review the results.  Testing/Procedures: NOT NEEDED  Follow-Up: At [facility], you and your health needs are our priority.  As part of our continuing mission to provide you with exceptional heart care, we have created designated Provider Care Teams.  These Care Teams include your primary Cardiologist (physician) and Advanced Practice Providers (APPs -  Physician Assistants and Nurse Practitioners) who all work together to provide you with the care you need, when you need it. You will need a follow up appointment in 10 months.  Please call our office 2 months in advance to schedule this appointment.  You may see [name], MD or one of the following Advanced Practice Providers on your designated Care Team:   Rosaria Ferries,  PA-C . Jory Sims, DNP, ANP  Any Other Special Instructions Will Be Listed Below (If Applicable).    Studies Ordered:   Orders Placed This Encounter  Procedures  . EKG 12-Lead      [name], M.D., M.S. Interventional Cardiologist   Pager # [phone] Phone # [phone] [address] [place], [place] [address]   Thank you for choosing Heartcare at [facility]!!

## 2018-02-14 ENCOUNTER — Encounter: Payer: Self-pay | Admitting: Cardiology

## 2018-02-14 NOTE — Assessment & Plan Note (Signed)
Status post ablation.  Not on full anti-coagulation.  On calcium channel blocker.

## 2018-02-14 NOTE — Assessment & Plan Note (Signed)
Is on aspirin and Plavix.  It is okay to stop aspirin, but he chooses to continue. Most recent Myoview was negative post PCI.  Not longer having any angina symptoms. Okay to hold Plavix for procedures.

## 2018-02-14 NOTE — Assessment & Plan Note (Signed)
In February, his labs are not quite at goal.  PCP is to recheck labs soon. We will follow-up labs.  May need to consider increasing to 40 mg

## 2018-02-14 NOTE — Assessment & Plan Note (Signed)
Blood pressure better controlled today with diltiazem.  May need to consider ARB if pressures increase.  (Would choose ARB over ACE inhibitor in order to avoid pulmonary side effects)

## 2018-02-14 NOTE — Assessment & Plan Note (Signed)
Back to baseline.  I suspect this is related to his interstitial lung disease.  Mild component of diastolic dysfunction.  We will hold off on further ischemic evaluation as symptoms have improved.

## 2018-02-14 NOTE — Assessment & Plan Note (Signed)
Distant history of ablation.  No longer on anticoagulation with no recurrence.  Monitor showed atrial tachycardia not flutter or fibrillation.

## 2018-02-14 NOTE — Assessment & Plan Note (Signed)
No further angina symptoms.  Well-controlled with diltiazem is also used for rate control.  (Use diltiazem instead of beta-blocker because of fatigue on beta-blockers).  Chest tightness seems to have resolved.  Remains on aspirin and Plavix along with statin.

## 2018-02-14 NOTE — Assessment & Plan Note (Signed)
Elevated LVEDP on cath and echocardiogram.  Normal EF.  No heart failure symptoms of PND orthopnea with mild lower extremity edema.  I suspect the edema is probably more related to diltiazem and interstitial lung disease.  No diuretic requirement.

## 2018-02-17 ENCOUNTER — Other Ambulatory Visit: Payer: Self-pay | Admitting: Cardiology

## 2018-02-17 ENCOUNTER — Encounter: Payer: Self-pay | Admitting: Pulmonary Disease

## 2018-02-17 ENCOUNTER — Ambulatory Visit (INDEPENDENT_AMBULATORY_CARE_PROVIDER_SITE_OTHER): Payer: Medicare Other | Admitting: Pulmonary Disease

## 2018-02-17 ENCOUNTER — Ambulatory Visit (INDEPENDENT_AMBULATORY_CARE_PROVIDER_SITE_OTHER)
Admission: RE | Admit: 2018-02-17 | Discharge: 2018-02-17 | Disposition: A | Discharge status: Home / Self Care | Payer: Medicare Other | Source: Ambulatory Visit | Attending: Pulmonary Disease | Admitting: Pulmonary Disease

## 2018-02-17 VITALS — BP 136/80 | HR 67 | Ht 75.0 in | Wt 254.4 lb

## 2018-02-17 DIAGNOSIS — I4891 Unspecified atrial fibrillation: Secondary | ICD-10-CM | POA: Diagnosis not present

## 2018-02-17 DIAGNOSIS — R05 Cough: Secondary | ICD-10-CM | POA: Diagnosis not present

## 2018-02-17 DIAGNOSIS — J40 Bronchitis, not specified as acute or chronic: Secondary | ICD-10-CM | POA: Diagnosis not present

## 2018-02-17 DIAGNOSIS — J849 Interstitial pulmonary disease, unspecified: Secondary | ICD-10-CM

## 2018-02-17 MED ORDER — DOXYCYCLINE HYCLATE 100 MG PO TABS
100.0000 mg | ORAL_TABLET | Freq: Two times a day (BID) | ORAL | 0 refills | Status: DC
Start: 2018-02-17 — End: 2018-06-05

## 2018-02-17 NOTE — Assessment & Plan Note (Signed)
Chest Xray today   Doxycycline >>> 1 100 mg tablet every 12 hours for 7 days >>>take with food  >>>wear sunscreen   Continue Prednisone

## 2018-02-17 NOTE — Patient Instructions (Addendum)
Chest Xray today   Doxycycline >>> 1 100 mg tablet every 12 hours for 7 days >>>take with food  >>>wear sunscreen   Continue Prednisone   Follow up with Cardiology regarding symptoms        November/2019 we will be moving! We will no longer be at our [place] location.  Be on the look out for a post card/mailer to let you know we have officially moved.  Our new address and phone number will be:  [address] [place], [place] [address] Telephone number: [phone]  It is flu season:   >>>Remember to be washing your hands regularly, using hand sanitizer, be careful to use around herself with has contact with people who are sick will increase her chances of getting sick yourself. >>> Best ways to protect herself from the flu: Receive the yearly flu vaccine, practice good hand hygiene washing with soap and also using hand sanitizer when available, eat a nutritious meals, get adequate rest, hydrate appropriately   Please contact the office if your symptoms worsen or you have concerns that you are not improving.   Thank you for choosing [facility] for your healthcare, and for allowing [place] to partner with you on your healthcare journey. I am thankful to be able to provide care to you today.   Wyn Quaker FNP-C

## 2018-02-17 NOTE — Progress Notes (Signed)
@Patient  ID: Kenneth [name], male    DOB: 02-20-1946, 72 y.o.   MRN: [id number]  Chief Complaint  Patient presents with  . Follow-up    Short of breath / cough    Referring provider: [facility], [facility]  HPI:  72 year old male remote smoker (cigars) followed in our office for ILD which favors NSIP.  PMH: Psoriasis, A. fib Smoker/ Smoking History: Remote Smoker - Cigars  Maintenance:  5mg  prednisone Pt of: Optician, dispensing Pulmonary Encounters:   02/09/2018  - Visit - BM 72 year old male patient following up with our office today for an acute visit.  Patient reports that 2 days ago he had increased cough.  This cough has been nonproductive.  Patient reports that last time he started to develop the symptoms symptoms worsened and he required an increase in his prednisone.  Patient reports additional symptoms such as increased shortness of breath, fatigue, occasional dizziness.  Patient denies other symptoms such as fever, chills, wheezing. Patient is traveling to [place] later on this month for 2 weeks and wants to ensure that he is doing well and stable for that trip.  Patient also reporting occasional bouts of dizziness with position such as when he stands too quickly or bends over. Plan: pred taper, zpak for travel    02/17/2018  - Visit   72 year old male presenting today for follow-up visit.  Patient was recently seen earlier this month we given increased prednisone dose.  Patient reports that since being last seen the cough persists and now has become productive with yellow mucus.  Patient also endorses increased shortness of breath as well as increased fatigue.  Patient feels that he is not able to mobilize sputum.  Cough is worse at night.  Patient also completed follow-up with cardiology on 02/12/2018.  His Cardizem dose was increased from 180 mg at 240 mg daily.  Patient reports he is felt more fatigued since then.  Patient reports he has not followed up with cardiology  regarding his change in symptoms after medication changes.  Patient spouse is here with him today.  They are concerned about the symptoms as they are planning on traveling internationally to [place] at the end of this month.     Tests:  05/27/2017-CT chest high-res- compatible with nonspecific interstitial pneumonia, several new small 2 to 4 mm pulmonary nodules >>>noncontrast chest CT could be considered in 12 months patient is high risk, dilated pulmonary trunk concerning for PAH  11/15/2013-echocardiogram-LV ejection fraction 65 to 70%, mild concentric hypertrophy, PAP pressure 35  FENO:  No results found for: NITRICOXIDE  PFT: PFT Results Latest Ref Rng & Units 04/25/2015  FVC-Pre L 3.07  FVC-Predicted Pre % 60  FVC-Post L 3.10  FVC-Predicted Post % 61  Pre FEV1/FVC % % 83  Post FEV1/FCV % % 83  FEV1-Pre L 2.54  FEV1-Predicted Pre % 68  FEV1-Post L 2.58  DLCO UNC% % 67  DLCO COR %Predicted % 106  TLC L 5.26  TLC % Predicted % 68  RV % Predicted % 27    Imaging: Dg Chest 2 View  Result Date: 02/17/2018 CLINICAL DATA:  Productive cough for the past 2 weeks. History of previous episodes of pneumonia. Interstitial lung disease, atrial fibrillation, CHF, coronary artery disease, former smoker. EXAM: CHEST - 2 VIEW COMPARISON:  PA and lateral chest x-ray of May 14, 2017 FINDINGS: The lungs are well-expanded. The interstitial markings are coarse and slightly more conspicuous today. There is no alveolar infiltrate or  pleural effusion. The heart is top-normal in size. The pulmonary vascularity is slightly more conspicuous. There is calcification in the wall of the aortic arch. There is no pleural effusion. The bony thorax exhibits no acute abnormality. IMPRESSION: Chronic interstitial lung disease. Possible superimposed low-grade CHF. No alveolar pneumonia. Thoracic aortic atherosclerosis. Electronically Signed   By: David  Martinique M.D.   On: 02/17/2018 16:17    Chart Review:     Specialty Problems      Pulmonary Problems   Allergic rhinitis    Qualifier: Diagnosis of  By: Doy Mince LPN, Megan        UNSPEC ALVEOLAR&PARIETOALVEOLAR PNEUMONOPATHY    Noted in 09/2007-resolved       Obstructive sleep apnea           OTHER CHRONIC SINUSITIS    Qualifier: Diagnosis of  By: Linna Darner MD, Gwyndolyn Saxon        ILD (interstitial lung disease) ([place])    Favor NSIP ?related to methotrexate vs hypersensitivity pneumonitis Appears to be somewhat steroid responsive      Bronchitis   Exertional dyspnea      Allergies  Allergen Reactions  . Hydrocodone Shortness Of Breath    In combination with decongestants  . Other Palpitations and Other (See Comments)    ALL DECONGESTANTS - CAUSE PALPITATIONS; THROWS HEART RHYTHM OUT OF BALANCE  . Oxycodone Shortness Of Breath  . Pseudoephedrine Palpitations  . Tramadol Shortness Of Breath and Rash    flushing    Immunization History  Administered Date(s) Administered  . H1N1 03/18/2008  . Influenza Split 03/27/2011  . Influenza Whole 02/24/2008, 01/12/2009  . Influenza, High Dose Seasonal PF 01/15/2016, 12/30/2016, 01/26/2018  . Influenza,inj,Quad PF,6+ Mos 01/03/2014  . Influenza-Unspecified 01/23/2015  . Pneumococcal-Unspecified 01/15/2016    Past Medical History:  Diagnosis Date  . Arthritis   . CAD S/P percutaneous coronary angioplasty cardiologist-  dr Ellyn Hack    06/28/16 --PCI with Synergy DES 2.75 mm 20 mm-->m RCA; 07/10/16 PCI with Synergy DES 3.5 mm x 20 mm-->mLAD  . Diverticulosis of colon   . Dyspnea on exertion   . Epiretinal membrane    "epiretinal attachment" (06/28/2016) 10-14-2017 per pt oringinally in both , now only unilateral   . Frequency of urination   . GERD (gastroesophageal reflux disease)   . Heart murmur   . Hiatal hernia   . History of adenomatous polyp of colon   . History of concussion 08/23/2017   w/o loc--- per pt no residual  . History of kidney stones   . History of  rheumatic fever 1958  . History of skin cancer    excision leg;  froze the left arm--- unsure BCC or SCC   . Hyperlipidemia   . Hypertension   . ILD (interstitial lung disease) ([place])    pulmologist-  dr Elsworth Soho--  secondary to methotrexate use --- last PFTs 04-25-2015  mild restriction  . Iron deficiency anemia   . Paroxysmal atrial fibrillation [facility]) cardiologist-  dr harding/  EP-- dr allred   a. 11/2003 Tikosyn initiated - subsequently d/c'd;  b. 2006 Gramling for Afib @ [facility];  c. 09/2011 Echo: EF 60-65%, Gr 2 DD;  d. DCCV 10/2011 , 04/2012, 12/ 2014, & 02/ 2015;  e. RFCA 01-13-2014  . Psoriasis    rheumatologist and dermologist at [facility] --  dx 1969  . Right ureteral stone   . S/P drug eluting coronary stent placement    06-28-2016  x1 to mRCA;  07-10-2016 x1 to  mLAD  . Seasonal allergies   . Type 2 diabetes mellitus ([place])   . Urgency of urination   . Wears hearing aid in both ears     Tobacco History: Social History   Tobacco Use  Smoking Status Former Smoker  . Years: 10.00  . Types: Cigars  . Last attempt to quit: 09/29/1997  . Years since quitting: 20.4  Smokeless Tobacco Former Systems developer  . Types: Snuff, Chew   Counseling given: Not Answered  Continue to not smoke  Outpatient Encounter Medications as of 02/17/2018  Medication Sig  . acetaminophen (TYLENOL) 500 MG tablet Take 1,000 mg by mouth every 6 (six) hours as needed (for pain.).  Marland Kitchen aspirin 81 MG chewable tablet CHEW AND SWALLOW 1 TABLET BY MOUTH ONCE DAILY  . Biotin 1000 MCG tablet Take 1,000 mcg by mouth at bedtime.  . calcipotriene (DOVONOX) 0.005 % cream Apply 1 application topically 2 (two) times daily as needed (for psoriasis.).  Marland Kitchen cetirizine (ZYRTEC) 10 MG tablet Take 10 mg by mouth every morning.   . clobetasol cream (TEMOVATE) 4.70 % Apply 1 application topically 2 (two) times daily as needed (for psoriasis).  . clopidogrel (PLAVIX) 75 MG tablet TAKE 1 TABLET BY MOUTH ONCE DAILY WITH BREAKFAST  .  diltiazem (CARDIZEM CD) 240 MG 24 hr capsule Take 1 capsule (240 mg total) by mouth daily.  . ferrous sulfate 325 (65 FE) MG EC tablet Take 325 mg by mouth daily with breakfast.  . Flurandrenolide (CORDRAN) 4 MCG/SQCM TAPE Apply 1 each topically at bedtime. Applied to areas affected by psoriasis.  Marland Kitchen gabapentin (NEURONTIN) 300 MG capsule Take 300 mg by mouth 2 (two) times daily.   . metFORMIN (GLUCOPHAGE) 500 MG tablet Take by mouth 2 (two) times daily with a meal.   . nitroGLYCERIN (NITROSTAT) 0.4 MG SL tablet Place 1 tablet (0.4 mg total) under the tongue every 5 (five) minutes as needed for chest pain.  Vladimir Faster Glycol-Propyl Glycol (SYSTANE) 0.4-0.3 % GEL ophthalmic gel Place 1 application into both eyes daily as needed.   . predniSONE (DELTASONE) 10 MG tablet Take 2 tablets (20mg  total) daily for the next 7days, then 1 tablet (10mg  total) for 7 days, then resume 5mg  dose  . predniSONE (DELTASONE) 5 MG tablet TAKE 1 TABLET BY MOUTH ONCE DAILY WITH BREAKFAST  . Probiotic Product (PHILLIPS COLON HEALTH PO) Take 1 tablet by mouth daily.  . [DISCONTINUED] pantoprazole (PROTONIX) 40 MG tablet TAKE 1 TABLET BY MOUTH ONCE DAILY (Patient taking differently: TAKE 1 TABLET BY MOUTH ONCE DAILY PRN)  . doxycycline (VIBRA-TABS) 100 MG tablet Take 1 tablet (100 mg total) by mouth 2 (two) times daily.  . rosuvastatin (CRESTOR) 20 MG tablet Take 1 tablet (20 mg total) by mouth daily. (Patient taking differently: Take 20 mg by mouth every evening. )   Facility-Administered Encounter Medications as of 02/17/2018  Medication  . lidocaine (cardiac) 100 mg/47ml (XYLOCAINE) 20 MG/ML injection 2%  . propofol (DIPRIVAN) 10 mg/mL bolus/IV push     Review of Systems  Review of Systems  Constitutional: Positive for fatigue. Negative for activity change, chills, fever and unexpected weight change.  HENT: Positive for congestion and postnasal drip. Negative for rhinorrhea, sinus pressure, sinus pain, sneezing and  sore throat.   Eyes: Negative.   Respiratory: Positive for cough (Productive cough with yellow mucus) and shortness of breath. Negative for wheezing.   Cardiovascular: Negative for chest pain and palpitations.  Gastrointestinal: Negative for constipation, diarrhea, nausea and vomiting.  Endocrine: Negative.   Musculoskeletal: Negative.   Skin: Negative.   Neurological: Negative for dizziness and headaches.  Psychiatric/Behavioral: Negative.  Negative for dysphoric mood. The patient is not nervous/anxious.   All other systems reviewed and are negative.    Physical Exam  BP 136/80 (BP Location: Left Arm, Cuff Size: Normal)   Pulse 67   Ht 6\' 3"  (1.905 m)   Wt 254 lb 6.4 oz (115.4 kg)   SpO2 97%   BMI 31.80 kg/m   Wt Readings from Last 5 Encounters:  02/17/18 254 lb 6.4 oz (115.4 kg)  02/12/18 254 lb (115.2 kg)  02/09/18 253 lb 6.4 oz (114.9 kg)  12/18/17 248 lb 6.4 oz (112.7 kg)  12/16/17 250 lb (113.4 kg)     Physical Exam  Constitutional: He is oriented to person, place, and time and well-developed, well-nourished, and in no distress. No distress.  HENT:  Head: Normocephalic and atraumatic.  Right Ear: Hearing, tympanic membrane, external ear and ear canal normal.  Left Ear: Hearing, tympanic membrane, external ear and ear canal normal.  Nose: Nose normal. Right sinus exhibits no maxillary sinus tenderness and no frontal sinus tenderness. Left sinus exhibits no maxillary sinus tenderness and no frontal sinus tenderness.  Mouth/Throat: Uvula is midline and oropharynx is clear and moist. No oropharyngeal exudate.  Eyes: Pupils are equal, round, and reactive to light.  Neck: Normal range of motion. Neck supple. No JVD present.  Cardiovascular: Normal rate, regular rhythm and normal heart sounds.  Pulmonary/Chest: Effort normal and breath sounds normal. No accessory muscle usage. No respiratory distress. He has no decreased breath sounds. He has no wheezes. He has no rhonchi.   + Bibasilar crackles  Musculoskeletal: Normal range of motion. He exhibits no edema.  Lymphadenopathy:    He has no cervical adenopathy.  Neurological: He is alert and oriented to person, place, and time. Gait normal.  Skin: Skin is warm and dry. He is not diaphoretic. No erythema.  Psychiatric: Mood, memory and judgment normal. He has a flat affect.  Nursing note and vitals reviewed.     Lab Results:  CBC    Component Value Date/Time   WBC 20.5 (H) 10/29/2017 0503   RBC 4.25 10/29/2017 0503   HGB 12.6 (L) 10/29/2017 0503   HGB 12.5 (L) 05/12/2017 1130   HCT 37.4 (L) 10/29/2017 0503   HCT 37.3 (L) 05/12/2017 1130   PLT 202 10/29/2017 0503   PLT 242 05/12/2017 1130   MCV 88.0 10/29/2017 0503   MCV 83 05/12/2017 1130   MCH 29.6 10/29/2017 0503   MCHC 33.7 10/29/2017 0503   RDW 13.6 10/29/2017 0503   RDW 15.9 (H) 05/12/2017 1130   LYMPHSABS 1.1 10/29/2017 0503   MONOABS 0.8 10/29/2017 0503   EOSABS 0.0 10/29/2017 0503   BASOSABS 0.0 10/29/2017 0503    BMET    Component Value Date/Time   NA 138 10/29/2017 0503   NA 143 05/12/2017 1130   K 4.2 10/29/2017 0503   CL 105 10/29/2017 0503   CO2 25 10/29/2017 0503   GLUCOSE 201 (H) 10/29/2017 0503   BUN 23 10/29/2017 0503   BUN 14 05/12/2017 1130   CREATININE 0.91 10/29/2017 0503   CALCIUM 9.2 10/29/2017 0503   GFRNONAA >60 10/29/2017 0503   GFRAA >60 10/29/2017 0503    BNP No results found for: BNP  ProBNP    Component Value Date/Time   PROBNP 76.7 09/27/2013 1556      Assessment & Plan:  72 year old male patient complaining follow-up visit today.  Will get chest x-ray as well as start patient on doxycycline.  We will treat as an acute bronchitis.  Suspect that symptoms are also complicated by increase in Cardizem.  Patient will follow-up with cardiology to discuss the symptoms.  Patient to follow-up if symptoms are worsening or in 3 months  Atrial fibrillation [facility]) Follow-up with cardiology regarding  symptoms changing after Cardizem increase  Bronchitis Chest Xray today   Doxycycline >>> 1 100 mg tablet every 12 hours for 7 days >>>take with food  >>>wear sunscreen   Continue Prednisone    ILD (interstitial lung disease) ([facility]) Chest Xray today   Doxycycline >>> 1 100 mg tablet every 12 hours for 7 days >>>take with food  >>>wear sunscreen   Continue Prednisone     This appointment was 28 minutes long with over 50% of that time direct face-to-face patient care, assessment, plan of care follow-up.  Lauraine Rinne, NP 02/17/2018

## 2018-02-17 NOTE — Assessment & Plan Note (Signed)
Follow-up with cardiology regarding symptoms changing after Cardizem increase

## 2018-02-17 NOTE — Assessment & Plan Note (Addendum)
Chest Xray today   Doxycycline >>> 1 100 mg tablet every 12 hours for 7 days >>>take with food  >>>wear sunscreen   Continue Prednisone

## 2018-02-18 NOTE — Progress Notes (Signed)
Chest x-ray results of come back showing no pneumonia.  No plan of care changes today. Continue antibiotic at this time.   Wyn Quaker FNP

## 2018-03-02 ENCOUNTER — Telehealth: Payer: Self-pay | Admitting: Cardiology

## 2018-03-02 NOTE — Telephone Encounter (Signed)
WILL FORWARD TO DR [facility] FOR REVIEW./CY

## 2018-03-02 NOTE — Telephone Encounter (Signed)
New Message   Pt c/o medication issue:  1. Name of Medication: diltiazem (CARDIZEM CD) 240 MG 24 hr capsule  2. How are you currently taking this medication (dosage and times per day)?   3. Are you having a reaction (difficulty breathing--STAT)? fatigue  4. What is your medication issue? Patients wife is calling because since being on the 240mg  he has been fatigued. They will be traveling out of the country come Friday and they want to know can he go back to the 180. Please call to discuss

## 2018-03-03 MED ORDER — DILTIAZEM HCL ER COATED BEADS 180 MG PO CP24: 180.0000 mg | ORAL_CAPSULE | Freq: Every day | ORAL | 3 refills | Status: DC

## 2018-03-03 NOTE — Telephone Encounter (Signed)
Spoke to patient ,  Patient aware can reduce to 180 mg diltiazem  Daily. Patient states [place]  [place]. Patient states he will pick up medication today. Patient states he  Start the 180 mg 2 days ago and he is feeling last tired today.

## 2018-03-03 NOTE — Telephone Encounter (Signed)
OK - just go back to 180 mg tab  Change Rx to Cardizem CD 180 mg PO Daily. Disp 90 Tab 3 Rifills

## 2018-03-24 ENCOUNTER — Other Ambulatory Visit: Payer: Self-pay | Admitting: Cardiology

## 2018-05-11 DIAGNOSIS — R52 Pain, unspecified: Secondary | ICD-10-CM | POA: Diagnosis not present

## 2018-05-11 DIAGNOSIS — M7061 Trochanteric bursitis, right hip: Secondary | ICD-10-CM | POA: Diagnosis not present

## 2018-06-05 ENCOUNTER — Emergency Department (HOSPITAL_COMMUNITY): Payer: Medicare Other

## 2018-06-05 ENCOUNTER — Encounter (HOSPITAL_COMMUNITY): Payer: Self-pay

## 2018-06-05 ENCOUNTER — Emergency Department (HOSPITAL_COMMUNITY)
Admission: EM | Admit: 2018-06-05 | Discharge: 2018-06-06 | Disposition: A | Discharge status: Home / Self Care | Payer: Medicare Other | Attending: Emergency Medicine | Admitting: Emergency Medicine

## 2018-06-05 DIAGNOSIS — I10 Essential (primary) hypertension: Secondary | ICD-10-CM | POA: Insufficient documentation

## 2018-06-05 DIAGNOSIS — E860 Dehydration: Secondary | ICD-10-CM | POA: Insufficient documentation

## 2018-06-05 DIAGNOSIS — Z79899 Other long term (current) drug therapy: Secondary | ICD-10-CM | POA: Insufficient documentation

## 2018-06-05 DIAGNOSIS — R079 Chest pain, unspecified: Secondary | ICD-10-CM | POA: Diagnosis not present

## 2018-06-05 DIAGNOSIS — J209 Acute bronchitis, unspecified: Secondary | ICD-10-CM | POA: Insufficient documentation

## 2018-06-05 DIAGNOSIS — Z87891 Personal history of nicotine dependence: Secondary | ICD-10-CM | POA: Diagnosis not present

## 2018-06-05 DIAGNOSIS — R5383 Other fatigue: Secondary | ICD-10-CM | POA: Diagnosis not present

## 2018-06-05 DIAGNOSIS — R627 Adult failure to thrive: Secondary | ICD-10-CM | POA: Diagnosis not present

## 2018-06-05 DIAGNOSIS — Z7982 Long term (current) use of aspirin: Secondary | ICD-10-CM | POA: Diagnosis not present

## 2018-06-05 DIAGNOSIS — J40 Bronchitis, not specified as acute or chronic: Secondary | ICD-10-CM

## 2018-06-05 LAB — URINALYSIS, ROUTINE W REFLEX MICROSCOPIC
Bilirubin Urine: NEGATIVE
Glucose, UA: NEGATIVE mg/dL
HGB URINE DIPSTICK: NEGATIVE
Ketones, ur: 20 mg/dL — AB
Leukocytes,Ua: NEGATIVE
Nitrite: NEGATIVE
Protein, ur: NEGATIVE mg/dL
Specific Gravity, Urine: 1.021 (ref 1.005–1.030)
pH: 5 (ref 5.0–8.0)

## 2018-06-05 LAB — CBC
HEMATOCRIT: 37 % — AB (ref 39.0–52.0)
Hemoglobin: 11.9 g/dL — ABNORMAL LOW (ref 13.0–17.0)
MCH: 27.5 pg (ref 26.0–34.0)
MCHC: 32.2 g/dL (ref 30.0–36.0)
MCV: 85.6 fL (ref 80.0–100.0)
Platelets: 213 10*3/uL (ref 150–400)
RBC: 4.32 MIL/uL (ref 4.22–5.81)
RDW: 13.4 % (ref 11.5–15.5)
WBC: 5.5 10*3/uL (ref 4.0–10.5)
nRBC: 0 % (ref 0.0–0.2)

## 2018-06-05 LAB — CBG MONITORING, ED: Glucose-Capillary: 131 mg/dL — ABNORMAL HIGH (ref 70–99)

## 2018-06-05 LAB — BASIC METABOLIC PANEL
Anion gap: 13 (ref 5–15)
BUN: 10 mg/dL (ref 8–23)
CO2: 22 mmol/L (ref 22–32)
Calcium: 8.8 mg/dL — ABNORMAL LOW (ref 8.9–10.3)
Chloride: 99 mmol/L (ref 98–111)
Creatinine, Ser: 0.99 mg/dL (ref 0.61–1.24)
GFR calc Af Amer: >60 mL/min (ref >60)
GFR calc non Af Amer: >60 mL/min (ref >60)
GLUCOSE: 140 mg/dL — AB (ref 70–99)
Potassium: 3.3 mmol/L — ABNORMAL LOW (ref 3.5–5.1)
Sodium: 134 mmol/L — ABNORMAL LOW (ref 135–145)

## 2018-06-05 LAB — TROPONIN I: Troponin I: <0.03 ng/mL (ref <0.03)

## 2018-06-05 MED ORDER — DOXYCYCLINE HYCLATE 100 MG PO CAPS: 100.0000 mg | ORAL_CAPSULE | Freq: Two times a day (BID) | ORAL | 0 refills | Status: AC

## 2018-06-05 MED ORDER — POTASSIUM CHLORIDE CRYS ER 20 MEQ PO TBCR
40.0000 meq | EXTENDED_RELEASE_TABLET | Freq: Once | ORAL | Status: AC
  Administered 2018-06-05: 40 meq via ORAL
  Filled 2018-06-05: qty 2

## 2018-06-05 MED ORDER — ALBUTEROL SULFATE HFA 108 (90 BASE) MCG/ACT IN AERS
2.0000 | INHALATION_SPRAY | Freq: Once | RESPIRATORY_TRACT | Status: AC
  Administered 2018-06-05: 2 via RESPIRATORY_TRACT
  Filled 2018-06-05: qty 6.7

## 2018-06-05 MED ORDER — SODIUM CHLORIDE 0.9 % IV BOLUS
1000.0000 mL | Freq: Once | INTRAVENOUS | Status: AC
  Administered 2018-06-05: 1000 mL via INTRAVENOUS

## 2018-06-05 NOTE — ED Triage Notes (Signed)
Pt endorses fatigue, not eating/drinking, dehydrated, has shingles x 3 weeks. Confused at times. VSS

## 2018-06-05 NOTE — ED Notes (Signed)
Pt feeling tired and not eating for over a week  No appetite

## 2018-06-05 NOTE — ED Provider Notes (Signed)
[facility] EMERGENCY DEPARTMENT Provider Note   CSN: [id number] Arrival date & time: 06/05/18  1800    History   Chief Complaint Chief Complaint  Patient presents with  . Fatigue    HPI Kenneth [name] is a 73 y.o. male.      Illness  Location:  Patient has been taking care of his wife in the hospital and has had failure to thrive with decreased p.o. intake since taking care of her. Severity:  Mild Onset quality:  Gradual Duration:  3 days Timing:  Constant Progression:  Unchanged Chronicity:  New Context:  Decreased p.o. intake over the last few days due to being in the hospital with his wife to take care of her. Relieved by:  Nothing Worsened by:  Nothing Associated symptoms: fatigue   Associated symptoms: no abdominal pain, no chest pain, no congestion, no cough, no ear pain, no fever, no rash, no shortness of breath, no sore throat, no vomiting and no wheezing     Past Medical History:  Diagnosis Date  . Arthritis   . CAD S/P percutaneous coronary angioplasty cardiologist-  dr Ellyn Hack    06/28/16 --PCI with Synergy DES 2.75 mm 20 mm-->m RCA; 07/10/16 PCI with Synergy DES 3.5 mm x 20 mm-->mLAD  . Diverticulosis of colon   . Dyspnea on exertion   . Epiretinal membrane    "epiretinal attachment" (06/28/2016) 10-14-2017 per pt oringinally in both , now only unilateral   . Frequency of urination   . GERD (gastroesophageal reflux disease)   . Heart murmur   . Hiatal hernia   . History of adenomatous polyp of colon   . History of concussion 08/23/2017   w/o loc--- per pt no residual  . History of kidney stones   . History of rheumatic fever 1958  . History of skin cancer    excision leg;  froze the left arm--- unsure BCC or SCC   . Hyperlipidemia   . Hypertension   . ILD (interstitial lung disease) ([place])    pulmologist-  dr Elsworth Soho--  secondary to methotrexate use --- last PFTs 04-25-2015  mild restriction  . Iron deficiency anemia   . Paroxysmal  atrial fibrillation [facility]) cardiologist-  dr harding/  EP-- dr allred   a. 11/2003 Tikosyn initiated - subsequently d/c'd;  b. 2006 [place] for Afib @ [place];  c. 09/2011 Echo: EF 60-65%, Gr 2 DD;  d. DCCV 10/2011 , 04/2012, 12/ 2014, & 02/ 2015;  e. RFCA 01-13-2014  . Psoriasis    rheumatologist and dermologist at [facility] --  dx 1969  . Right ureteral stone   . S/P drug eluting coronary stent placement    06-28-2016  x1 to mRCA;  07-10-2016 x1 to mLAD  . Seasonal allergies   . Type 2 diabetes mellitus ([place])   . Urgency of urination   . Wears hearing aid in both ears     Patient Active Problem List   Diagnosis Date Noted  . Atrial tachycardia ([place]) 02/12/2018  . Postprocedural fever 10/29/2017  . Right ureteral stone 10/27/2017  . Controlled type 2 diabetes mellitus without complication, without long-term current use of insulin ([place]) 05/12/2017  . Essential hypertension 05/12/2017  . Iron deficiency anemia 11/11/2016  . Hyperlipidemia with target low density lipoprotein (LDL) cholesterol less than 70 mg/dL 11/11/2016  . CAD S/P percutaneous coronary angioplasty 06/28/2016  . Coronary artery disease involving native coronary artery with angina pectoris ([place]) 06/27/2016  . Exertional dyspnea 06/27/2016  .  Bronchitis 07/03/2015  . ILD (interstitial lung disease) ([place]) 01/27/2014  . Atypical atrial flutter ([place]) 01/13/2014  . Hx of colonic polyp 07/15/2013  . Diastolic CHF, chronic ([place]) 05/22/2013  . Chronic anticoagulation 03/25/2013  . Atrial flutter ([place]) 10/23/2011  . OTHER CHRONIC SINUSITIS 05/31/2010  . NEPHROLITHIASIS, HX OF 02/02/2008  . PERSISTENT DISORDER INITIATING/MAINTAINING SLEEP 12/16/2007  . Obstructive sleep apnea 10/07/2007  . UNSPEC ALVEOLAR&PARIETOALVEOLAR PNEUMONOPATHY 10/02/2007  . Allergic rhinitis 07/24/2007  . Atrial fibrillation ([place]) 07/06/2007    Past Surgical History:  Procedure Laterality Date  . APPENDECTOMY  1996  . ATRIAL FIBRILLATION ABLATION  N/A 01/13/2014   repeat PVI, also left atrial ablation performed with successfull ablation of LA flutter by Dr Rayann Heman  . ATRIAL FIBRILLATION ABLATION  08/2004   Dr Rolland Porter at [facility] [place])  . CARDIAC CATHETERIZATION  06-30-2003  dr Lia Foyer   no significant coronary obstruction, well preserved LVF, recurrent afib  . CARDIOVASCULAR STRESS TEST  10-22-2016    dr harding   Low risk nuclear study w/ no ischemia/  normal LV function and wall motion ,  nuclear stress ef 57%  . CARDIOVERSION N/A 05/22/2013   Procedure: CARDIOVERSION;  Surgeon: [name], MD;  Location: [place];  Service: Cardiovascular;  Laterality: N/A;  . CARDIOVERSION N/A 10/23/2011   Procedure: CARDIOVERSION;  Surgeon: Deboraha Sprang, MD;  Location: [facility] CATH LAB;  Service: Cardiovascular;  Laterality: N/A;  . CARDIOVERSION N/A 05/01/2012   Procedure: CARDIOVERSION;  Surgeon: Deboraha Sprang, MD;  Location: [facility] CATH LAB;  Service: Cardiovascular;  Laterality: N/A;  . CARPAL TUNNEL RELEASE Right 1980s  . CATARACT EXTRACTION W/ INTRAOCULAR LENS  IMPLANT, BILATERAL Bilateral   . CATARACT EXTRACTION W/ INTRAOCULAR LENS  IMPLANT, BILATERAL  2012  approx.  . CORONARY STENT INTERVENTION N/A 06/28/2016   Procedure: Coronary Stent Intervention;  Surgeon: Leonie Man, MD;  Location: [place] CV LAB;  Service: Cardiovascular;  Laterality: mRCA PCI -Synergy DES 2.75 m x 20 mm (3.1 mm)  . CORONARY STENT INTERVENTION N/A 07/10/2016   Procedure: Coronary Stent Intervention;  Surgeon: Leonie Man, MD;  Location: [place] CV LAB;  Service: Cardiovascular: mLAD PCI Synergy DES 3.5 mm x 20 mm)  . CYSTOSCOPY W/ STONE MANIPULATION  2010   Patient states he has had several kidney stones up until aroune 2010, none since (06/28/2016)  . CYSTOSCOPY WITH RETROGRADE PYELOGRAM, URETEROSCOPY AND STENT PLACEMENT Right 10/16/2017   Procedure: CYSTOSCOPY WITH RIGHT RETROGRADE RIGHT URETEROSCOPY AND STONE BASKET EXTRACTION;  Surgeon: Irine Seal, MD;   Location: [facility];  Service: Urology;  Laterality: Right;  . CYSTOSCOPY/RETROGRADE/URETEROSCOPY/STONE EXTRACTION WITH BASKET Right 10/27/2017   Procedure: CYSTOSCOPY/RETROGRADE/URETEROSCOPY/STONE EXTRACTION WITH BASKET/URETERAL STENT PLACEMENT and laser;  Surgeon: Irine Seal, MD;  Location: [facility];  Service: Urology;  Laterality: Right;  . HOLMIUM LASER APPLICATION Right 08/08/7739   Procedure: POSSIBLE HOLMIUM LASER APPLICATION;  Surgeon: Irine Seal, MD;  Location: [facility];  Service: Urology;  Laterality: Right;  . INTRAVASCULAR PRESSURE WIRE/FFR STUDY N/A 06/28/2016   Procedure: Intravascular Pressure Wire/FFR Study;  Surgeon: Leonie Man, MD;  Location: [place] CV LAB;  Service: Cardiovascular: FFR of mLAD ~65% lesion = 0.75 post --> Staged PCI  . NASAL SINUS SURGERY     x 2  . PROXIMAL INTERPHALANGEAL FUSION (PIP) Left 01-05-2001    dr graves   correction clawtoe and extensor tendon lengthening  . RIGHT/LEFT HEART CATH AND CORONARY ANGIOGRAPHY N/A 06/28/2016   Procedure:  Right/Left Heart Cath and Coronary Angiography;  Surgeon: Leonie Man, MD;  Location: [place] CV LAB;  Service: Cardiovascular: mRCA 99% (TIMI 2), mLAD ~65% (FFR 0.75), OM3 55%. mild Pulm HTN. Mod LVEDP elevation. EF 55-65%.  Marland Kitchen ROTATOR CUFF REPAIR Bilateral last one 2014  . TONSILLECTOMY  age 89  . TRANSTHORACIC ECHOCARDIOGRAM  11/15/2013   ef 65-70%,  due to Afib unable to evaluate LVDF/  AV sclerosis without stenosis or regurg./  MV calcification without stenosis or regurg/  ascending aorta mild dilated, 73mm) ,  mild to moderate LAE/  mild Tr/  RVSP 47mmHg  . URETEROSCOPIC LASER LITHOTRIPSY STONE EXTRACTIONS/  STENT PLACEMENT Bilateral 09-10-2007   dr Jeffie Pollock  [facility]   per pt has had several prior ureteroscopic stone extraction since age 17 , last one 09-10-2007        Home Medications    Prior to Admission medications   Medication Sig Start Date End Date Taking? Authorizing Provider    acetaminophen (TYLENOL) 500 MG tablet Take 1,000 mg by mouth every 6 (six) hours as needed (for pain.).   Yes [provider]  Adalimumab (HUMIRA) 40 MG/0.4ML PSKT Inject 40 mg into the skin every 14 (fourteen) days.   Yes [provider]  aspirin 81 MG chewable tablet CHEW AND SWALLOW 1 TABLET BY MOUTH ONCE DAILY Patient taking differently: Chew 81 mg by mouth daily with breakfast.  03/25/18  Yes Leonie Man, MD  Biotin 1000 MCG tablet Take 1,000 mcg by mouth at bedtime.   Yes [provider]  CALCIUM PO Take 1,200 mg by mouth daily.   Yes [provider]  cetirizine (ZYRTEC) 10 MG tablet Take 10 mg by mouth daily with breakfast.    Yes [provider]  clobetasol cream (TEMOVATE) 3.21 % Apply 1 application topically 2 (two) times daily as needed (for psoriasis).   Yes [provider]  clopidogrel (PLAVIX) 75 MG tablet TAKE 1 TABLET BY MOUTH ONCE DAILY WITH BREAKFAST Patient taking differently: Take 75 mg by mouth daily with breakfast.  09/23/17  Yes Leonie Man, MD  diltiazem (CARDIZEM CD) 180 MG 24 hr capsule Take 1 capsule (180 mg total) by mouth daily. Patient taking differently: Take 180 mg by mouth at bedtime.  03/03/18 08/04/18 Yes Leonie Man, MD  ferrous sulfate 325 (65 FE) MG EC tablet Take 325 mg by mouth See admin instructions. Take one tablet (325 mg) by mouth twice weekly - on Wednesday and Saturday   Yes [provider]  Flurandrenolide (CORDRAN) 4 MCG/SQCM TAPE Apply 1 each topically See admin instructions. Wrap sores caused by psoriasis as needed until wounds heal.   Yes [provider]  gabapentin (NEURONTIN) 300 MG capsule Take 300-600 mg by mouth See admin instructions. Take one capsule (300 mg) by mouth every morning, take two capsules (600 mg) at night   Yes [provider]  metFORMIN (GLUCOPHAGE) 500 MG tablet Take 500 mg by mouth 2 (two) times daily with a meal.    Yes [provider]  nitroGLYCERIN (NITROSTAT) 0.4 MG SL tablet Place 1 tablet (0.4 mg total) under the tongue every 5 (five) minutes as needed for chest pain. 06/29/16  Yes Bhagat, Bhavinkumar, PA  pantoprazole (PROTONIX) 40 MG tablet TAKE 1 TABLET BY MOUTH ONCE DAILY Patient taking differently: Take 40 mg by mouth daily before breakfast.  02/17/18  Yes Leonie Man, MD  Polyethyl Glycol-Propyl Glycol (SYSTANE OP) Place 1 drop into both  eyes daily.   Yes [provider]  Polyethyl Glycol-Propyl Glycol (SYSTANE) 0.4-0.3 % GEL ophthalmic gel Place 1 application into both eyes at bedtime.    Yes [provider]  predniSONE (DELTASONE) 5 MG tablet TAKE 1 TABLET BY MOUTH ONCE DAILY WITH BREAKFAST Patient taking differently: Take 5 mg by mouth daily with breakfast.  11/10/17  Yes Rigoberto Noel, MD  rosuvastatin (CRESTOR) 20 MG tablet Take 1 tablet (20 mg total) by mouth daily. Patient taking differently: Take 20 mg by mouth at bedtime.  05/20/17 07/04/18 Yes [place], Tami Lin, PA  zolpidem (AMBIEN) 10 MG tablet Take 10 mg by mouth at bedtime as needed for sleep.   Yes [provider]  doxycycline (VIBRAMYCIN) 100 MG capsule Take 1 capsule (100 mg total) by mouth 2 (two) times daily for 7 days. 06/05/18 06/12/18  Orson Aloe, MD    Family History Family History  Problem Relation Age of Onset  . Coronary artery disease Mother   . Breast cancer Mother   . Colon cancer Mother   . Heart disease Maternal Grandmother        MI  . Stroke Paternal Grandfather        CVA  . Coronary artery disease Paternal Grandfather   . Esophageal cancer Neg Hx   . Stomach cancer Neg Hx   . Rectal cancer Neg Hx     Social History Social History   Tobacco Use  . Smoking status: Former Smoker    Years: 10.00    Types: Cigars    Last attempt to quit: 09/29/1997    Years since quitting: 20.6  . Smokeless tobacco: Former Systems developer    Types: Snuff, Chew  Substance Use Topics  . Alcohol use:  No  . Drug use: No     Allergies   Hydrocodone; Other; Oxycodone; Pseudoephedrine; and Tramadol   Review of Systems Review of Systems  Constitutional: Positive for fatigue. Negative for chills and fever.  HENT: Negative for congestion, ear pain and sore throat.   Eyes: Negative for pain and visual disturbance.  Respiratory: Negative for cough, shortness of breath and wheezing.   Cardiovascular: Negative for chest pain and palpitations.  Gastrointestinal: Negative for abdominal pain and vomiting.  Genitourinary: Negative for dysuria and hematuria.  Musculoskeletal: Negative for arthralgias and back pain.  Skin: Negative for color change and rash.  Neurological: Negative for seizures and syncope.  All other systems reviewed and are negative.    Physical Exam Updated Vital Signs BP (!) 126/57   Pulse 84   Temp 98.2 F (36.8 C) (Oral)   Resp (!) 25   SpO2 99%   Physical Exam Vitals signs and nursing note reviewed.  Constitutional:      Appearance: He is well-developed.     Comments: Patient resting comfortably, no acute distress.  HENT:     Head: Normocephalic and atraumatic.     Mouth/Throat:     Mouth: Mucous membranes are dry.  Eyes:     Conjunctiva/sclera: Conjunctivae normal.  Neck:     Musculoskeletal: Neck supple.  Cardiovascular:     Rate and Rhythm: Normal rate and regular rhythm.     Heart sounds: No murmur.  Pulmonary:     Effort: Pulmonary effort is normal. No respiratory distress.     Breath sounds: Normal breath sounds.  Abdominal:     General: There is no distension.     Palpations: Abdomen is soft. There is no mass.  Tenderness: There is no abdominal tenderness.  Musculoskeletal: Normal range of motion.  Skin:    General: Skin is warm and dry.     Capillary Refill: Capillary refill takes less than 2 seconds.  Neurological:     General: No focal deficit present.     Mental Status: He is alert and oriented to person, place, and time. Mental  status is at baseline.  Psychiatric:        Mood and Affect: Mood normal.      ED Treatments / Results  Labs (all labs ordered are listed, but only abnormal results are displayed) Labs Reviewed  BASIC METABOLIC PANEL - Abnormal; Notable for the following components:      Result Value   Sodium 134 (*)    Potassium 3.3 (*)    Glucose, Bld 140 (*)    Calcium 8.8 (*)    All other components within normal limits  CBC - Abnormal; Notable for the following components:   Hemoglobin 11.9 (*)    HCT 37.0 (*)    All other components within normal limits  URINALYSIS, ROUTINE W REFLEX MICROSCOPIC - Abnormal; Notable for the following components:   Ketones, ur 20 (*)    All other components within normal limits  CBG MONITORING, ED - Abnormal; Notable for the following components:   Glucose-Capillary 131 (*)    All other components within normal limits  TROPONIN I    EKG None  Radiology Dg Chest 2 View  Result Date: 06/05/2018 CLINICAL DATA:  Chest pain EXAM: CHEST - 2 VIEW COMPARISON:  02/17/2018 FINDINGS: Mild peribronchial thickening and interstitial prominence, stable. Heart is upper limits normal in size. No effusions or acute bony abnormality. IMPRESSION: Mild peribronchial thickening and interstitial prominence is similar prior study and could reflect chronic bronchitis or chronic interstitial lung disease. Electronically Signed   By: Rolm Baptise M.D.   On: 06/05/2018 18:56    Procedures Procedures (including critical care time)  Medications Ordered in ED Medications  sodium chloride 0.9 % bolus 1,000 mL (0 mLs Intravenous Stopped 06/05/18 2340)  potassium chloride SA (K-DUR,KLOR-CON) CR tablet 40 mEq (40 mEq Oral Given 06/05/18 2345)  albuterol (PROVENTIL HFA;VENTOLIN HFA) 108 (90 Base) MCG/ACT inhaler 2 puff (2 puffs Inhalation Given 06/05/18 2346)     Initial Impression / Assessment and Plan / ED Course  I have reviewed the triage vital signs and the nursing  notes.  Pertinent labs & imaging results that were available during my care of the patient were reviewed by me and considered in my medical decision making (see chart for details).        73 year old male with significant past medical history listed above, reviewed, old notes reviewed as well including cardiology notations and previous echocardiogram.  Patient has been taken care of his wife in the hospital because of this he has had decreased p.o. intake and food which is caused him to be fatigued.  Family works here Art gallery manager who thought that the patient was likely dehydrated.  Patient denies any infectious-like symptoms, no chest pain, no shortness of breath.  Physical exam consistent with no infectious-like symptoms, resolving shingles on the left side, dry mucous membranes.  Laboratory studies appropriate, reviewed we will give potassium replacement here in the emergency department.  Will give IV fluids and reassess.  Patient reassessment resting comfortably, no acute distress, stable vital signs.  Based on cough, chest x-ray will give Doxy for home and albuterol here today, as well due to  mild wheeze on exam on reassessment. EKG shows no signs of ischemia, negative troponin.  Discussed with appropriate hydration and taking care of self; family in agreement with this plan.  Patient is going to increase hydration.  Discharged in stable edition was able to signs.  The above care was discussed and agreed upon by my attending physician.  Final Clinical Impressions(s) / ED Diagnoses   Final diagnoses:  Other fatigue  Bronchitis  Failure to thrive in adult  Dehydration    ED Discharge Orders         Ordered    doxycycline (VIBRAMYCIN) 100 MG capsule  2 times daily     06/05/18 2342           Orson Aloe, MD 06/06/18 1222    Merrily Pew, MD 06/16/18 [phone]

## 2018-06-08 DIAGNOSIS — I10 Essential (primary) hypertension: Secondary | ICD-10-CM | POA: Diagnosis not present

## 2018-06-08 DIAGNOSIS — B028 Zoster with other complications: Secondary | ICD-10-CM | POA: Diagnosis not present

## 2018-06-08 DIAGNOSIS — R5383 Other fatigue: Secondary | ICD-10-CM | POA: Diagnosis not present

## 2018-06-08 DIAGNOSIS — E1142 Type 2 diabetes mellitus with diabetic polyneuropathy: Secondary | ICD-10-CM | POA: Diagnosis not present

## 2018-06-08 DIAGNOSIS — I4891 Unspecified atrial fibrillation: Secondary | ICD-10-CM | POA: Diagnosis not present

## 2018-06-08 DIAGNOSIS — I517 Cardiomegaly: Secondary | ICD-10-CM | POA: Diagnosis not present

## 2018-06-08 DIAGNOSIS — Z885 Allergy status to narcotic agent status: Secondary | ICD-10-CM | POA: Diagnosis not present

## 2018-06-08 DIAGNOSIS — Z87891 Personal history of nicotine dependence: Secondary | ICD-10-CM | POA: Diagnosis not present

## 2018-06-08 DIAGNOSIS — Z7982 Long term (current) use of aspirin: Secondary | ICD-10-CM | POA: Diagnosis not present

## 2018-06-08 DIAGNOSIS — Z888 Allergy status to other drugs, medicaments and biological substances status: Secondary | ICD-10-CM | POA: Diagnosis not present

## 2018-06-08 DIAGNOSIS — E785 Hyperlipidemia, unspecified: Secondary | ICD-10-CM | POA: Diagnosis not present

## 2018-06-08 DIAGNOSIS — Z791 Long term (current) use of non-steroidal anti-inflammatories (NSAID): Secondary | ICD-10-CM | POA: Diagnosis not present

## 2018-06-08 DIAGNOSIS — I499 Cardiac arrhythmia, unspecified: Secondary | ICD-10-CM | POA: Diagnosis not present

## 2018-06-08 DIAGNOSIS — R531 Weakness: Secondary | ICD-10-CM | POA: Diagnosis not present

## 2018-06-08 DIAGNOSIS — R918 Other nonspecific abnormal finding of lung field: Secondary | ICD-10-CM | POA: Diagnosis not present

## 2018-06-08 DIAGNOSIS — Z7984 Long term (current) use of oral hypoglycemic drugs: Secondary | ICD-10-CM | POA: Diagnosis not present

## 2018-06-08 DIAGNOSIS — Z79899 Other long term (current) drug therapy: Secondary | ICD-10-CM | POA: Diagnosis not present

## 2018-07-06 ENCOUNTER — Other Ambulatory Visit: Payer: Self-pay

## 2018-07-06 MED ORDER — ROSUVASTATIN CALCIUM 20 MG PO TABS: 20.0000 mg | ORAL_TABLET | Freq: Every day | ORAL | 10 refills | Status: DC

## 2018-07-13 DIAGNOSIS — R1032 Left lower quadrant pain: Secondary | ICD-10-CM | POA: Diagnosis not present

## 2018-07-13 DIAGNOSIS — N5201 Erectile dysfunction due to arterial insufficiency: Secondary | ICD-10-CM | POA: Diagnosis not present

## 2018-07-13 DIAGNOSIS — R3915 Urgency of urination: Secondary | ICD-10-CM | POA: Diagnosis not present

## 2018-07-13 DIAGNOSIS — N401 Enlarged prostate with lower urinary tract symptoms: Secondary | ICD-10-CM | POA: Diagnosis not present

## 2018-08-03 ENCOUNTER — Telehealth: Payer: Self-pay | Admitting: Cardiology

## 2018-08-03 ENCOUNTER — Telehealth (HOSPITAL_COMMUNITY): Payer: Self-pay | Admitting: *Deleted

## 2018-08-03 DIAGNOSIS — D649 Anemia, unspecified: Secondary | ICD-10-CM

## 2018-08-03 DIAGNOSIS — T148XXA Other injury of unspecified body region, initial encounter: Secondary | ICD-10-CM

## 2018-08-03 NOTE — Telephone Encounter (Signed)
Spoke with patients wife and she is concerned about the increased bruising/bleeding and taking Plavix and ASA.  Also patient has not had any follow up lab since he was in the hospital. He does take iron 3 x a week I\and she does not understand why is Hgb is lower. [facility] unless emergent. Will forward to Dr Ellyn Hack for review

## 2018-08-03 NOTE — Telephone Encounter (Signed)
Patient called in stating since working in the yard more the last several weeks he has noticed increased bruising and with any cut it bleeds "a lot." pt on plavix and aspirin daily - educated pt that this is normal characteristics of a pt on dual therapy. Pt wanting to possibly stop one of these medications - instructed pt he would need to talk with Dr. Ellyn Hack regarding this. Pt verbalized understanding to continue medications as prescribed and discuss further with Dr. Ellyn Hack.

## 2018-08-03 NOTE — Telephone Encounter (Signed)
Recommendations reviewed with Kenneth [name] and patient will come for labs this week.

## 2018-08-03 NOTE — Telephone Encounter (Signed)
New Message:    Pt is on Aspirin and Plavix. Pt have started bleeding and bruising so bad lately. She wants to talk to you about maybe reducing his Aspirin.

## 2018-08-03 NOTE — Telephone Encounter (Signed)
Pts wife called to report that the pt has been having increased bruising on his arms and has noticed with just a knick o fhis skin he bleeds very easily and has to apply a lot of pressure to make it stop.. pt was in the hosp 05/2018 with shingles and his labs were abnormal... low HGB, HCT and K... she says pt has been very tired.. She is going to try and call his PMD.. Dr. Landry Mellow with the [facility] in [place] and see if pt can be seen with possible repeat labs and will call us back if he can have them done there and forward results to [place]... and if not.. to let us know and can check talk with Dr. Ellyn Hack... Will forward to him for his review.

## 2018-08-03 NOTE — Telephone Encounter (Signed)
Stop ASA. We can recheck CBC just to make sure it is stable -- probably level went down with hydration in hospital.  Need to re-evaluate.  Kenneth [name] c

## 2018-08-03 NOTE — Telephone Encounter (Signed)
Patient's spouse is calling back stating she called patient's PCP's office at the [place], they told her they are not seeing patients unless it's an emergency.  She would like a call back.

## 2018-08-06 DIAGNOSIS — T148XXA Other injury of unspecified body region, initial encounter: Secondary | ICD-10-CM | POA: Diagnosis not present

## 2018-08-06 DIAGNOSIS — D649 Anemia, unspecified: Secondary | ICD-10-CM | POA: Diagnosis not present

## 2018-08-06 LAB — CBC WITH DIFFERENTIAL/PLATELET
Basophils Absolute: 0 10*3/uL (ref 0.0–0.2)
Basos: 1 %
EOS (ABSOLUTE): 0 10*3/uL (ref 0.0–0.4)
Eos: 1 %
Hematocrit: 29.2 % — ABNORMAL LOW (ref 37.5–51.0)
Hemoglobin: 8.8 g/dL — ABNORMAL LOW (ref 13.0–17.7)
Immature Grans (Abs): 0 10*3/uL (ref 0.0–0.1)
Immature Granulocytes: 1 %
Lymphocytes Absolute: 1.3 10*3/uL (ref 0.7–3.1)
Lymphs: 23 %
MCH: 25 pg — ABNORMAL LOW (ref 26.6–33.0)
MCHC: 30.1 g/dL — ABNORMAL LOW (ref 31.5–35.7)
MCV: 83 fL (ref 79–97)
Monocytes Absolute: 0.6 10*3/uL (ref 0.1–0.9)
Monocytes: 10 %
Neutrophils Absolute: 3.6 10*3/uL (ref 1.4–7.0)
Neutrophils: 64 %
Platelets: 250 10*3/uL (ref 150–450)
RBC: 3.52 x10E6/uL — ABNORMAL LOW (ref 4.14–5.80)
RDW: 14.5 % (ref 11.6–15.4)
WBC: 5.5 10*3/uL (ref 3.4–10.8)

## 2018-08-07 ENCOUNTER — Other Ambulatory Visit: Payer: Self-pay | Admitting: Pulmonary Disease

## 2018-08-07 ENCOUNTER — Telehealth: Payer: Self-pay | Admitting: *Deleted

## 2018-08-07 ENCOUNTER — Telehealth: Payer: Self-pay | Admitting: Cardiology

## 2018-08-07 DIAGNOSIS — T148XXA Other injury of unspecified body region, initial encounter: Secondary | ICD-10-CM

## 2018-08-07 DIAGNOSIS — D649 Anemia, unspecified: Secondary | ICD-10-CM

## 2018-08-07 DIAGNOSIS — Z7901 Long term (current) use of anticoagulants: Secondary | ICD-10-CM

## 2018-08-07 MED ORDER — ASPIRIN EC 81 MG PO TBEC: 81.0000 mg | DELAYED_RELEASE_TABLET | Freq: Every day | ORAL | 3 refills | Status: DC

## 2018-08-07 NOTE — Telephone Encounter (Signed)
Notes recorded by Leonie Man, MD on 08/07/2018 at 3:41 PM EDT So it looks like you had more than just some nicks and bruises that have caused some bleeding. Her hemoglobin levels gone down from roughly 12-8.8. Not yet at a level that we would think about doing a transfusion, but we should probably recheck it probably by next week.  Plan for now will be to stop Plavix, then after 1 week go back to taking just baby aspirin without Plavix. Increase iron supplement from twice a week to every day. We will recheck a CBC with Iron(anemia) panel & UA next week.  I would also like for him to talk with his [place] PCP about potentially needing GI evaluation if there is no evidence of hematuria.  [name], MD      Spoke to wife , the above instruction given with results - wife verbalized understanding. Wife states she wil contact [place]- given them the information and will  Obtain fax #  - so- office can send  Lab results . Patient will come in Thursday may 7 for labs , medication changes

## 2018-08-07 NOTE — Telephone Encounter (Signed)
Lab results reviewed -- Hgb drop .  Recommendations: D/c plavix --> after 1 week restart ASA 81 mg only. Increase FeSO4 to daily from 2 d/week. Labs in 1 week - CBC, UA, Fe/Anemia panel.  Needs to talk with PCP re: ? GI evaluation.   [name], MD

## 2018-08-10 ENCOUNTER — Telehealth: Payer: Self-pay | Admitting: Cardiology

## 2018-08-10 DIAGNOSIS — R9431 Abnormal electrocardiogram [ECG] [EKG]: Secondary | ICD-10-CM | POA: Diagnosis not present

## 2018-08-10 DIAGNOSIS — K5730 Diverticulosis of large intestine without perforation or abscess without bleeding: Secondary | ICD-10-CM | POA: Diagnosis not present

## 2018-08-10 DIAGNOSIS — D649 Anemia, unspecified: Secondary | ICD-10-CM | POA: Diagnosis not present

## 2018-08-10 DIAGNOSIS — I10 Essential (primary) hypertension: Secondary | ICD-10-CM | POA: Diagnosis not present

## 2018-08-10 DIAGNOSIS — R590 Localized enlarged lymph nodes: Secondary | ICD-10-CM | POA: Diagnosis not present

## 2018-08-10 DIAGNOSIS — Z87891 Personal history of nicotine dependence: Secondary | ICD-10-CM | POA: Diagnosis not present

## 2018-08-10 DIAGNOSIS — Z7982 Long term (current) use of aspirin: Secondary | ICD-10-CM | POA: Diagnosis not present

## 2018-08-10 DIAGNOSIS — E1140 Type 2 diabetes mellitus with diabetic neuropathy, unspecified: Secondary | ICD-10-CM | POA: Diagnosis not present

## 2018-08-10 DIAGNOSIS — N200 Calculus of kidney: Secondary | ICD-10-CM | POA: Diagnosis not present

## 2018-08-10 DIAGNOSIS — K219 Gastro-esophageal reflux disease without esophagitis: Secondary | ICD-10-CM | POA: Diagnosis not present

## 2018-08-10 DIAGNOSIS — E785 Hyperlipidemia, unspecified: Secondary | ICD-10-CM | POA: Diagnosis not present

## 2018-08-10 DIAGNOSIS — Z79899 Other long term (current) drug therapy: Secondary | ICD-10-CM | POA: Diagnosis not present

## 2018-08-10 DIAGNOSIS — K654 Sclerosing mesenteritis: Secondary | ICD-10-CM | POA: Diagnosis not present

## 2018-08-10 DIAGNOSIS — Z7902 Long term (current) use of antithrombotics/antiplatelets: Secondary | ICD-10-CM | POA: Diagnosis not present

## 2018-08-10 DIAGNOSIS — R110 Nausea: Secondary | ICD-10-CM | POA: Diagnosis not present

## 2018-08-10 NOTE — Telephone Encounter (Signed)
FORWARD TO DR Ellyn Hack FOR INPUT

## 2018-08-10 NOTE — Telephone Encounter (Signed)
FAXED INFORMATION - CBC REPORT TO  THE GIVEN FAX NUMBER.  LEFT MESSAGE TO WIFE - INFORMATION WAS FAXED.

## 2018-08-10 NOTE — Telephone Encounter (Signed)
Mrs Kenneth [name] is calling to give Kenneth [name] the fax number to fax the lab results to the [place]  Fax -[phone] Attn: Juliann Pulse

## 2018-08-10 NOTE — Telephone Encounter (Signed)
  Wife is calling because she would like to see if Dr Ellyn Hack will refer Kenneth [name] to Dr Jacinto Halim since his hemoglobin is 8.9. She thinks maybe he needs a colonoscopy.

## 2018-08-11 NOTE — Telephone Encounter (Signed)
CALLED AN SPOKE TO  WIFE . SHE STATES THAT WAS NOT NECESSARY -  SHE TOOK PATIENT TO [facility] - THAN RAN SOME TEST -   RECOMMEND PATIENT SEEING GI .  PATIENT HAS AN APPOINTMENT WITH DR Alvester Chou - [place] WILL LET [place] KNOW  THE THE OUTCOME.

## 2018-08-11 NOTE — Telephone Encounter (Signed)
I am fine with referral.  [name], MD

## 2018-08-11 NOTE — Telephone Encounter (Signed)
OK - as long as he is being seen by a GI MD.  [name], MD

## 2018-08-13 DIAGNOSIS — D508 Other iron deficiency anemias: Secondary | ICD-10-CM | POA: Diagnosis not present

## 2018-08-13 DIAGNOSIS — R935 Abnormal findings on diagnostic imaging of other abdominal regions, including retroperitoneum: Secondary | ICD-10-CM | POA: Diagnosis not present

## 2018-08-13 DIAGNOSIS — R195 Other fecal abnormalities: Secondary | ICD-10-CM | POA: Diagnosis not present

## 2018-08-17 ENCOUNTER — Telehealth: Payer: Self-pay | Admitting: Cardiology

## 2018-08-17 DIAGNOSIS — D122 Benign neoplasm of ascending colon: Secondary | ICD-10-CM | POA: Diagnosis not present

## 2018-08-17 DIAGNOSIS — K209 Esophagitis, unspecified: Secondary | ICD-10-CM | POA: Diagnosis not present

## 2018-08-17 DIAGNOSIS — D509 Iron deficiency anemia, unspecified: Secondary | ICD-10-CM | POA: Diagnosis not present

## 2018-08-17 DIAGNOSIS — K635 Polyp of colon: Secondary | ICD-10-CM | POA: Diagnosis not present

## 2018-08-17 DIAGNOSIS — R195 Other fecal abnormalities: Secondary | ICD-10-CM | POA: Diagnosis not present

## 2018-08-17 DIAGNOSIS — K3189 Other diseases of stomach and duodenum: Secondary | ICD-10-CM | POA: Diagnosis not present

## 2018-08-17 NOTE — Telephone Encounter (Signed)
New message   patient wife calling concering some medication and blood patient work. Patient is asking to speak with you. Please call back.

## 2018-08-17 NOTE — Telephone Encounter (Signed)
LATE ENTRY --Spoke to wife she states - patient had an endoscopy and colonoscopy today with Dr Alvester Chou at [place].   It showed one polyp which was removed- it will be biopsy.  Upper endoscopy good , unable to see @ 20 ft of small intestines. Mrs Baze states gi doctors question whether to continue with Plavix or not.  Patient has been off medication for @ 10 DAYS.  RN informed wife  Will give information to Dr Ellyn Hack. She verbalized understanding.

## 2018-08-17 NOTE — Telephone Encounter (Signed)
RN REVIEWED WITH DR Ellyn Hack --  SPOKE WITH PATIENT. INFORMED PATIENT PER DR HARDING  MAY CONTINUE TAKING ASPRIN 81 MG DAILY. DO NOT RESTART PLAVIX.   RN DISCONTINUE PLAVIX ON MED LIST.  PATIENT STATES UNDERSTANDING.

## 2018-08-24 DIAGNOSIS — H52203 Unspecified astigmatism, bilateral: Secondary | ICD-10-CM | POA: Diagnosis not present

## 2018-08-24 DIAGNOSIS — H5203 Hypermetropia, bilateral: Secondary | ICD-10-CM | POA: Diagnosis not present

## 2018-08-24 DIAGNOSIS — H35373 Puckering of macula, bilateral: Secondary | ICD-10-CM | POA: Diagnosis not present

## 2018-08-24 DIAGNOSIS — H524 Presbyopia: Secondary | ICD-10-CM | POA: Diagnosis not present

## 2018-08-24 DIAGNOSIS — H35342 Macular cyst, hole, or pseudohole, left eye: Secondary | ICD-10-CM | POA: Diagnosis not present

## 2018-09-01 ENCOUNTER — Telehealth: Payer: Self-pay | Admitting: Cardiology

## 2018-09-01 NOTE — Telephone Encounter (Signed)
Pt wife advised labs have already been ordered on the pt but she says he will have his CBC and urinalysis done at the [facility] 09/02/18 and will have them faxed to Dr. Ellyn Hack.

## 2018-09-01 NOTE — Telephone Encounter (Signed)
New Message    Patient would like to come in to get lab work done since they stopped Plavix.  Please call patient to see if it could be arranged.

## 2018-09-03 ENCOUNTER — Telehealth: Payer: Self-pay | Admitting: *Deleted

## 2018-09-03 NOTE — Telephone Encounter (Signed)
Called patient to schedule recall colonoscopy. He states he just had this done for anemai,bleeding. Per care everywhere the patient had EGD& COLON on 08/17/2018 with [facility]. Explained that he needs an office visit with Dr.Stark at this time. Patient did not want to make this appointment at this time because he is taking care of his wife (who has been dx w/cancer). Patient will call us back when he is ready to schedule the office visit.

## 2018-09-23 ENCOUNTER — Telehealth: Payer: Self-pay | Admitting: Cardiology

## 2018-09-23 NOTE — Telephone Encounter (Signed)
Patient's wife called she would like to speak to nurse about Kenneth [name]'s hemoglobin again.

## 2018-09-23 NOTE — Telephone Encounter (Signed)
Spoke to wife - she had question whether  Patient need to restart plavix  - since hgb has come back up 11.8 and  Question if Dr Ellyn Hack thought if  patient should proceed with having a  ( procedure in which he swallows a pill/camera  that would look at patient's small intestines) RN  Informed patient 's wife  - that Dr Ellyn Hack was aware of  Request  In May -  information was given to patient to stop Plavix and continue with aspirin only. Recommend proceeding with procedure the  GI is requesting.  Regular follow up appt  Schedule for Sept 14 -11:40 - as office visit - wife is aware it may change to a virtual  WIFE VERBALIZED UNDERSTANDING

## 2018-09-23 NOTE — Telephone Encounter (Signed)
Will froward to Dr Allison Quarry  RN.

## 2018-11-12 DIAGNOSIS — M7061 Trochanteric bursitis, right hip: Secondary | ICD-10-CM | POA: Diagnosis not present

## 2018-11-12 DIAGNOSIS — M76891 Other specified enthesopathies of right lower limb, excluding foot: Secondary | ICD-10-CM | POA: Diagnosis not present

## 2018-11-27 DIAGNOSIS — R300 Dysuria: Secondary | ICD-10-CM | POA: Diagnosis not present

## 2018-11-27 DIAGNOSIS — N200 Calculus of kidney: Secondary | ICD-10-CM | POA: Diagnosis not present

## 2018-11-27 DIAGNOSIS — N5201 Erectile dysfunction due to arterial insufficiency: Secondary | ICD-10-CM | POA: Diagnosis not present

## 2018-11-30 DIAGNOSIS — Z23 Encounter for immunization: Secondary | ICD-10-CM | POA: Diagnosis not present

## 2018-12-01 ENCOUNTER — Other Ambulatory Visit: Payer: Self-pay | Admitting: Pulmonary Disease

## 2018-12-15 DIAGNOSIS — Z87442 Personal history of urinary calculi: Secondary | ICD-10-CM | POA: Diagnosis not present

## 2018-12-15 DIAGNOSIS — R300 Dysuria: Secondary | ICD-10-CM | POA: Diagnosis not present

## 2018-12-21 ENCOUNTER — Encounter: Payer: Self-pay | Admitting: Cardiology

## 2018-12-21 ENCOUNTER — Ambulatory Visit (INDEPENDENT_AMBULATORY_CARE_PROVIDER_SITE_OTHER): Payer: Medicare Other | Admitting: Cardiology

## 2018-12-21 ENCOUNTER — Other Ambulatory Visit: Payer: Self-pay

## 2018-12-21 VITALS — BP 143/82 | HR 74 | Temp 97.9°F | Ht 75.0 in | Wt 235.0 lb

## 2018-12-21 DIAGNOSIS — E1169 Type 2 diabetes mellitus with other specified complication: Secondary | ICD-10-CM

## 2018-12-21 DIAGNOSIS — J849 Interstitial pulmonary disease, unspecified: Secondary | ICD-10-CM

## 2018-12-21 DIAGNOSIS — R0609 Other forms of dyspnea: Secondary | ICD-10-CM | POA: Diagnosis not present

## 2018-12-21 DIAGNOSIS — I5032 Chronic diastolic (congestive) heart failure: Secondary | ICD-10-CM | POA: Diagnosis not present

## 2018-12-21 DIAGNOSIS — Z9861 Coronary angioplasty status: Secondary | ICD-10-CM

## 2018-12-21 DIAGNOSIS — E785 Hyperlipidemia, unspecified: Secondary | ICD-10-CM

## 2018-12-21 DIAGNOSIS — E669 Obesity, unspecified: Secondary | ICD-10-CM | POA: Diagnosis not present

## 2018-12-21 DIAGNOSIS — I484 Atypical atrial flutter: Secondary | ICD-10-CM | POA: Diagnosis not present

## 2018-12-21 DIAGNOSIS — I10 Essential (primary) hypertension: Secondary | ICD-10-CM

## 2018-12-21 DIAGNOSIS — I25119 Atherosclerotic heart disease of native coronary artery with unspecified angina pectoris: Secondary | ICD-10-CM

## 2018-12-21 DIAGNOSIS — I4891 Unspecified atrial fibrillation: Secondary | ICD-10-CM | POA: Diagnosis not present

## 2018-12-21 DIAGNOSIS — I2510 Atherosclerotic heart disease of native coronary artery without angina pectoris: Secondary | ICD-10-CM

## 2018-12-21 DIAGNOSIS — I9789 Other postprocedural complications and disorders of the circulatory system, not elsewhere classified: Secondary | ICD-10-CM | POA: Diagnosis not present

## 2018-12-21 NOTE — Patient Instructions (Signed)
Medication Instructions:  Continue with current medications   If you need a refill on your cardiac medications before your next appointment, please call your pharmacy.   Lab work: Not needed   Testing/Procedures:  Not needed   Follow-Up: At [facility], you and your health needs are our priority.  As part of our continuing mission to provide you with exceptional heart care, we have created designated Provider Care Teams.  These Care Teams include your primary Cardiologist (physician) and Advanced Practice Providers (APPs -  Physician Assistants and Nurse Practitioners) who all work together to provide you with the care you need, when you need it. . You will need a follow up appointment in 12 months.  Please call our office 2 months in advance to schedule this appointment.  You may see [name], MD   Any Other Special Instructions Will Be Listed Below (If Applicable).

## 2018-12-21 NOTE — Progress Notes (Signed)
PCP: [facility], [place] Note: Chief Complaint  Patient presents with  . Follow-up    Doing relatively well  . Coronary Artery Disease    No angina  . Shortness of Breath    Has ILD and HFpEF    HPI: Kenneth [name] is a 73 y.o. male with a PMH of CAD-PCI & (brief peri-op Afib w/p Afib ablation)  who presents today for close ~1 year f/u.  --> Monitor ordered (EKG showed S. Arrhythmia) ; Chads2vasc score is 4 - but with no recurrent A. fib, is no longer on full anticoagulation.   Kenneth [name] was last seen by me back in Nov 2019 --> he noted that he is feeling better after lots of urologic procedures were completed.  Was back walking about a mile a day without any more than usual exertional dyspnea.  No chest pain or pressure.  Recent Hospitalizations: Significant issues with nephrolithiasis & UTI  Seen at [facility] ER 08/10/2018--noted fatigue and weakness and bruising.  Hemoglobin was 8.9.  Told to see GI -- Plavix Held (converted to aspirin)  Studies Personally Reviewed - (if available, images/films reviewed: From Epic Chart or Care Everywhere)  None since event monitor in September 2019.  Interval History: Kenneth [name] returns today for annual f/u --it really took a lot for him to even talk about his cardiac issues today.  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  He recounted several interesting missions that have caused him to have nightmares and make it very hard for him to watch TV.  When these episodes happen he has pretty significant palpitations and almost has terror episodes.  If the heart rate goes fast that he may notice some discomfort in his chest.  But does get short of breath.  Otherwise, he is doing okay he is not really having any sensation of palpitations without these episodes.  Not having a sensation of A. fib.  He is really worn out now and emotionally tired  because he is also now having to care for his wife who was diagnosed with uterine cancer.  He says his bruising is much better since we stopped Plavix.  His blood counts have been up and down still.  (Most recent recorded hemoglobin was 9.0 from May 4).   So he really is limited by his dyspnea from pulmonary fibrosis, but does still walk about a mile a day if not more and has not had any chest pain or pressure.  The dyspnea that he feels is not any different than it has been for a long time.  Fatigue has more to do not getting sleep.  If he gets a good night sleep he feels fine.  He denies any PND orthopnea but does have some mild swelling.  Bothers him more though as he is got some neuropathic pain in his feet more on the right than the left.  No claudication. Other than the fast heart rates when he has nightmares, he denies any irregular heartbeats palpitations.  No syncope/near syncope.  No TIA or amaurosis fugax symptoms.  He did have some polyps removed etc., but has not noticed any melena, hematochezia or hematuria.  No epistaxis.  ROS: A comprehensive was performed. Review of Systems  Constitutional: Positive for malaise/fatigue (Mostly limited by his lung disease making him dyspneic.) and weight loss (intentional.  Now a lot of it is is  having to do with the fact that he is the one doing a lot of preparing their meals.). Negative for chills and fever.  HENT: Negative for congestion and nosebleeds.   Respiratory: Positive for shortness of breath (at rest == ILD.  Stable). Negative for cough and wheezing.   Cardiovascular: Negative for claudication and leg swelling.  Gastrointestinal: Negative for blood in stool and constipation.  Genitourinary: Negative for dysuria and hematuria (Resolved).  Musculoskeletal: Positive for joint pain. Negative for falls and myalgias.  Neurological: Positive for headaches (off & on). Negative for dizziness and focal weakness.  Endo/Heme/Allergies:  Bruises/bleeds easily.  Psychiatric/Behavioral: Positive for depression. Negative for memory loss. The patient is nervous/anxious and has insomnia.        He does seem to be somewhat down.  He has signs of anhedonia.  Poor sleep.  Not eating like he used to.  And not sleeping well.  He is much more vocal about troubling issues that he has not talked to me about since have known him.   I have reviewed and (if needed) personally updated the patient's problem list, medications, allergies, past medical and surgical history, social and family history.   Past Medical History:  Diagnosis Date  . Arthritis   . CAD S/P percutaneous coronary angioplasty cardiologist-  dr Ellyn Hack    06/28/16 --PCI with Synergy DES 2.75 mm 20 mm-->m RCA; 07/10/16 PCI with Synergy DES 3.5 mm x 20 mm-->mLAD  . Diverticulosis of colon   . Dyspnea on exertion   . Epiretinal membrane    "epiretinal attachment" (06/28/2016) 10-14-2017 per pt oringinally in both , now only unilateral   . Frequency of urination   . GERD (gastroesophageal reflux disease)   . Heart murmur   . Hiatal hernia   . History of adenomatous polyp of colon   . History of concussion 08/23/2017   w/o loc--- per pt no residual  . History of kidney stones   . History of rheumatic fever 1958  . History of skin cancer    excision leg;  froze the left arm--- unsure BCC or SCC   . Hyperlipidemia   . Hypertension   . ILD (interstitial lung disease) ([place])    pulmologist-  dr Elsworth Soho--  secondary to methotrexate use --- last PFTs 04-25-2015  mild restriction  . Iron deficiency anemia   . Paroxysmal atrial fibrillation [facility]) cardiologist-  dr Homero Hyson/  EP-- dr allred   a. 11/2003 Tikosyn initiated - subsequently d/c'd;  b. 2006 [place] for Afib @ [place];  c. 09/2011 Echo: EF 60-65%, Gr 2 DD;  d. DCCV 10/2011 , 04/2012, 12/ 2014, & 02/ 2015;  e. RFCA 01-13-2014  . Psoriasis    rheumatologist and dermologist at [facility] --  dx 1969  . Right ureteral stone   . S/P  drug eluting coronary stent placement    06-28-2016  x1 to mRCA;  07-10-2016 x1 to mLAD  . Seasonal allergies   . Type 2 diabetes mellitus ([place])   . Urgency of urination   . Wears hearing aid in both ears     Past Surgical History:  Procedure Laterality Date  . APPENDECTOMY  1996  . ATRIAL FIBRILLATION ABLATION N/A 01/13/2014   repeat PVI, also left atrial ablation performed with successfull ablation of LA flutter by Dr Rayann Heman  . ATRIAL FIBRILLATION ABLATION  08/2004   Dr Rolland Porter at [facility] [place])  . CARDIAC CATHETERIZATION  06-30-2003  dr Lia Foyer   no significant coronary  obstruction, well preserved LVF, recurrent afib  . CARDIOVASCULAR STRESS TEST  10-22-2016    dr Ramsie Ostrander   Low risk nuclear study w/ no ischemia/  normal LV function and wall motion ,  nuclear stress ef 57%  . CARDIOVERSION N/A 05/22/2013   Procedure: CARDIOVERSION;  Surgeon: [name], MD;  Location: [facility];  Service: Cardiovascular;  Laterality: N/A;  . CARDIOVERSION N/A 10/23/2011   Procedure: CARDIOVERSION;  Surgeon: Deboraha Sprang, MD;  Location: [facility] CATH LAB;  Service: Cardiovascular;  Laterality: N/A;  . CARDIOVERSION N/A 05/01/2012   Procedure: CARDIOVERSION;  Surgeon: Deboraha Sprang, MD;  Location: [facility] CATH LAB;  Service: Cardiovascular;  Laterality: N/A;  . CARPAL TUNNEL RELEASE Right 1980s  . CATARACT EXTRACTION W/ INTRAOCULAR LENS  IMPLANT, BILATERAL Bilateral   . CATARACT EXTRACTION W/ INTRAOCULAR LENS  IMPLANT, BILATERAL  2012  approx.  . CORONARY STENT INTERVENTION N/A 06/28/2016   Procedure: Coronary Stent Intervention;  Surgeon: Leonie Man, MD;  Location: [facility] CV LAB;  Service: Cardiovascular;  Laterality: mRCA PCI -Synergy DES 2.75 m x 20 mm (3.1 mm)  . CORONARY STENT INTERVENTION N/A 07/10/2016   Procedure: Coronary Stent Intervention;  Surgeon: Leonie Man, MD;  Location: [facility] CV LAB;  Service: Cardiovascular: mLAD PCI Synergy DES 3.5 mm x 20 mm)  . CYSTOSCOPY W/ STONE  MANIPULATION  2010   Patient states he has had several kidney stones up until aroune 2010, none since (06/28/2016)  . CYSTOSCOPY WITH RETROGRADE PYELOGRAM, URETEROSCOPY AND STENT PLACEMENT Right 10/16/2017   Procedure: CYSTOSCOPY WITH RIGHT RETROGRADE RIGHT URETEROSCOPY AND STONE BASKET EXTRACTION;  Surgeon: Irine Seal, MD;  Location: [facility];  Service: Urology;  Laterality: Right;  . CYSTOSCOPY/RETROGRADE/URETEROSCOPY/STONE EXTRACTION WITH BASKET Right 10/27/2017   Procedure: CYSTOSCOPY/RETROGRADE/URETEROSCOPY/STONE EXTRACTION WITH BASKET/URETERAL STENT PLACEMENT and laser;  Surgeon: Irine Seal, MD;  Location: [facility];  Service: Urology;  Laterality: Right;  . HOLMIUM LASER APPLICATION Right [id number]   Procedure: POSSIBLE HOLMIUM LASER APPLICATION;  Surgeon: Irine Seal, MD;  Location: [facility];  Service: Urology;  Laterality: Right;  . INTRAVASCULAR PRESSURE WIRE/FFR STUDY N/A 06/28/2016   Procedure: Intravascular Pressure Wire/FFR Study;  Surgeon: Leonie Man, MD;  Location: [facility] CV LAB;  Service: Cardiovascular: FFR of mLAD ~65% lesion = 0.75 post --> Staged PCI  . NASAL SINUS SURGERY     x 2  . PROXIMAL INTERPHALANGEAL FUSION (PIP) Left 01-05-2001    dr graves   correction clawtoe and extensor tendon lengthening  . RIGHT/LEFT HEART CATH AND CORONARY ANGIOGRAPHY N/A 06/28/2016   Procedure: Right/Left Heart Cath and Coronary Angiography;  Surgeon: Leonie Man, MD;  Location: [facility] CV LAB;  Service: Cardiovascular: mRCA 99% (TIMI 2), mLAD ~65% (FFR 0.75), OM3 55%. mild Pulm HTN. Mod LVEDP elevation. EF 55-65%.  Marland Kitchen ROTATOR CUFF REPAIR Bilateral last one 2014  . TONSILLECTOMY  age 68  . TRANSTHORACIC ECHOCARDIOGRAM  11/15/2013   ef 65-70%,  due to Afib unable to evaluate LVDF/  AV sclerosis without stenosis or regurg./  MV calcification without stenosis or regurg/  ascending aorta mild dilated, 14mm) ,  mild to moderate LAE/  mild Tr/  RVSP 56mmHg  .  URETEROSCOPIC LASER LITHOTRIPSY STONE EXTRACTIONS/  STENT PLACEMENT Bilateral 09-10-2007   dr Jeffie Pollock  [facility]   per pt has had several prior ureteroscopic stone extraction since age 59 , last one 09-10-2007    Current Meds  Medication Sig  . acetaminophen (TYLENOL) 500 MG tablet  Take 1,000 mg by mouth every 6 (six) hours as needed (for pain.).  . Adalimumab (HUMIRA) 40 MG/0.4ML PSKT Inject 40 mg into the skin every 14 (fourteen) days.  Marland Kitchen aspirin EC 81 MG tablet Take 1 tablet (81 mg total) by mouth daily. Do not start until MAY 8,2020  . Biotin 1000 MCG tablet Take 1,000 mcg by mouth at bedtime.  Marland Kitchen CALCIUM PO Take 1,200 mg by mouth daily.  . cetirizine (ZYRTEC) 10 MG tablet Take 10 mg by mouth daily with breakfast.   . clobetasol cream (TEMOVATE) [id number] % Apply 1 application topically 2 (two) times daily as needed (for psoriasis).  Marland Kitchen diltiazem (CARDIZEM CD) 180 MG 24 hr capsule Take 1 capsule (180 mg total) by mouth daily. (Patient taking differently: Take 180 mg by mouth at bedtime. )  . ferrous sulfate 325 (65 FE) MG EC tablet Take 325 mg by mouth See admin instructions. Take one tablet (325 mg) by mouth  DAILY STARTING 08/07/18  . Flurandrenolide (CORDRAN) 4 MCG/SQCM TAPE Apply 1 each topically See admin instructions. Wrap sores caused by psoriasis as needed until wounds heal.  . gabapentin (NEURONTIN) 300 MG capsule Take 300-600 mg by mouth See admin instructions. Take one capsule (300 mg) by mouth every morning, take two capsules (600 mg) at night  . metFORMIN (GLUCOPHAGE) 500 MG tablet Take 500 mg by mouth 2 (two) times daily with a meal.   . nitroGLYCERIN (NITROSTAT) 0.4 MG SL tablet Place 1 tablet (0.4 mg total) under the tongue every 5 (five) minutes as needed for chest pain.  . pantoprazole (PROTONIX) 40 MG tablet TAKE 1 TABLET BY MOUTH ONCE DAILY (Patient taking differently: Take 40 mg by mouth daily before breakfast. )  . Polyethyl Glycol-Propyl Glycol (SYSTANE OP) Place 1 drop into both eyes  daily.  Vladimir Faster Glycol-Propyl Glycol (SYSTANE) 0.4-0.3 % GEL ophthalmic gel Place 1 application into both eyes at bedtime.   . predniSONE (DELTASONE) 5 MG tablet Take 1 tablet by mouth once daily with breakfast  . zolpidem (AMBIEN) 10 MG tablet Take 10 mg by mouth at bedtime as needed for sleep.    Allergies  Allergen Reactions  . Hydrocodone Shortness Of Breath    In combination with decongestants  . Other Palpitations and Other (See Comments)    ALL DECONGESTANTS - CAUSE PALPITATIONS; THROWS HEART RHYTHM OUT OF BALANCE  . Oxycodone Shortness Of Breath  . Pseudoephedrine Palpitations  . Tramadol Shortness Of Breath and Rash    flushing  . Gemfibrozil     Other reaction(s): Dizziness    Social History   Tobacco Use  . Smoking status: Former Smoker    Years: 10.00    Types: Cigars    Quit date: 09/29/1997    Years since quitting: 21.2  . Smokeless tobacco: Former Systems developer    Types: Snuff, Chew  Substance Use Topics  . Alcohol use: No  . Drug use: No   Social History   Social History Narrative   Pt lives in [place] with spouse.     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    family history includes Breast cancer in his mother; Colon cancer in his mother; Coronary artery disease in his mother and paternal grandfather; Heart disease in his maternal grandmother; Stroke in his paternal grandfather.  Wt Readings from Last 3 Encounters:  12/21/18 235 lb (106.6 kg)  02/17/18 254 lb 6.4 oz (115.4 kg)  02/12/18 254 lb (115.2 kg)  11/2016 - 259 lb  PHYSICAL EXAM BP (!) 143/82   Pulse 74   Temp 97.9 F (36.6 C)   Ht 6\' 3"  (1.905 m)   Wt 235 lb (106.6 kg)   SpO2 98%   BMI 29.37 kg/m  Physical Exam  Constitutional: He is oriented to person, place, and time. He appears well-developed and well-nourished. No distress.  Healthy appearing. Well groomed  HENT:  Head: Normocephalic and atraumatic.  Neck: Normal range of motion. Neck supple. No hepatojugular  reflux and no JVD present. Carotid bruit is not present.  Cardiovascular: Normal rate, regular rhythm, normal heart sounds, intact distal pulses and normal pulses.  Occasional extrasystoles are present. PMI is not displaced. Exam reveals no gallop and no friction rub.  No murmur heard. Pulmonary/Chest: Effort normal. No respiratory distress. He has no wheezes. He has no rales (Bibasilar crackles - R>L (from ILD)). He exhibits no tenderness.  Abdominal: Soft. Bowel sounds are normal. He exhibits no distension. There is no abdominal tenderness. There is no rebound.  No HSM  Musculoskeletal: Normal range of motion.        General: No edema.  Neurological: He is alert and oriented to person, place, and time.  Skin: Skin is warm and dry.  Psychiatric: His behavior is normal. Judgment and thought content normal.  He has a, flat somber affect.  Seems little bit down and depressed mood today.  Vitals reviewed.   Adult ECG Report  Rate: 83 ;  Rhythm: normal sinus rhythm; normal axis, intervals & durations  Narrative Interpretation: normal EKg  Other studies Reviewed: Additional studies/ records that were reviewed today include:  Recent Labs:   Lab Results  Component Value Date   CHOL 174 05/12/2017   HDL 49 05/12/2017   LDLCALC 86 05/12/2017   TRIG 197 (H) 05/12/2017   CHOLHDL 3.6 05/12/2017   Lab Results  Component Value Date   WBC 5.5 08/06/2018   HGB 8.8 (L) 08/06/2018   HCT 29.2 (L) 08/06/2018   MCV 83 08/06/2018   PLT 250 08/06/2018    ASSESSMENT / PLAN: Problem List Items Addressed This Visit    ILD (interstitial lung disease) ([place]) (Chronic)   Relevant Orders   EKG 12-Lead (Completed)   Coronary artery disease involving native coronary artery with angina pectoris ([place]) - Primary (Chronic)    Stable.  Walking without any angina or heart failure.  We are using diltiazem in lieu of beta-blocker.  With preserved EF this is acceptable. Diltiazem is also being used for rate  control and does provide antianginal benefit along with pulmonary vasodilation.  Is now only on aspirin and not Plavix.  Is not on statin because of fatigue and myalgia issues in the past.  In the past, blood pressures not been elevated.  If pressures do seem to increase, may want to use ARB (other than losartan).  Would avoid ACE inhibitor with pulmonary issues.      Relevant Orders   EKG 12-Lead (Completed)   Exertional dyspnea (Chronic)    It sounds like his dyspnea is pretty much at baseline.  Likely combination between interstitial lung disease and some diastolic dysfunction.  Worried to have exacerbation of symptoms, I think we would then add an ARB and consider diuretic.      Relevant Orders   EKG 12-Lead (Completed)   Diastolic CHF, chronic (HCC) (Chronic)    He had elevated LVEDP on cath and echo.  Not really having any orthopnea or heart failure symptoms.  He does have some mild  edema.  If blood pressures were to increase consistently, would probably consider adding ARB plus or minus chlorthalidone.      Atypical atrial flutter (HCC) (Chronic)    Distant history of flutter ablation in 2006.      CAD S/P percutaneous coronary angioplasty (Chronic)    Negative Myoview in 2018.  No anginal symptoms.  No longer on Plavix.  Remains on aspirin alone.  This was because of bleeding issues.      Hyperlipidemia with target low density lipoprotein (LDL) cholesterol less than 70 mg/dL (Chronic)   Essential hypertension (Chronic)    He tells me that his blood pressures are usually lower than the are here today when he is at home.  He has been little bit stressed out with caring for his wife.  He is been a follow his blood pressures.  If they do go higher, I probably would consider an ARB plus or minus thiazide diuretic.      Relevant Orders   EKG 12-Lead (Completed)   Postoperative atrial fibrillation ([place])    He had a brief episode of A. fib postoperatively back in November  2019 but has not had any symptoms of A. fib since.  We decided, especially in light of his GI bleed issues to only use aspirin for anticoagulation.  He is on diltiazem for rate control and anti-angina benefit.      Obesity (BMI 30-39.9)    His weight today was stated he is below the "obesity threshold".  He has lost a lot of weight in part because of exercise, mostly because of altered diet.  Just not eating as much.  I would hope that this would affect his blood pressure, glycemic control and lipids favorably.      Dyslipidemia associated with type 2 diabetes mellitus ([place])    He has had labs checked in the interval since I have seen him and still see the results.  LDL was 86 last year in February.  I have not seen any labs since then.  He tells me that he will make sure his PCP sends labs.  I do not see anything from his recent labs in care everywhere.  He is not on a statin because of intolerance.  Was pretty close a year and a half ago.  Would like to see what follow-up labs look like.  Reluctant to use statins because of his fatigue.         I spent a total of 35 minutes with the patient and chart review. >  50% of the time was spent in direct patient consultation.  A good portion of the visit was spent basically just letting him embellish some of his stories to get them out.  He felt better getting the stories off his chest.  Felt notably more relaxed once we talked about some issues.  Is strongly considering going back to psychiatry.  Current medicines are reviewed at length with the patient today.  (+/- concerns) n/a The following changes have been made:  none  Patient Instructions  Medication Instructions:  Continue with current medications   If you need a refill on your cardiac medications before your next appointment, please call your pharmacy.   Lab work: Not needed   Testing/Procedures:  Not needed   Follow-Up: At [facility], you and your health needs are  our priority.  As part of our continuing mission to provide you with exceptional heart care, we have created designated Provider Care Teams.  These  Care Teams include your primary Cardiologist (physician) and Advanced Practice Providers (APPs -  Physician Assistants and Nurse Practitioners) who all work together to provide you with the care you need, when you need it. . You will need a follow up appointment in 12 months.  Please call our office 2 months in advance to schedule this appointment.  You may see [name], MD   Any Other Special Instructions Will Be Listed Below (If Applicable).  Studies Ordered:   Orders Placed This Encounter  Procedures  . EKG 12-Lead      [name], M.D., M.S. Interventional Cardiologist   Pager # [phone] Phone # [phone] [address] [place], [place] [address]   Thank you for choosing Heartcare at [facility]!!

## 2018-12-25 ENCOUNTER — Telehealth: Payer: Self-pay | Admitting: *Deleted

## 2018-12-25 NOTE — Telephone Encounter (Signed)
MAILED  DISABILITY PLACARD TO PATIENT

## 2018-12-29 ENCOUNTER — Encounter: Payer: Self-pay | Admitting: Cardiology

## 2018-12-29 DIAGNOSIS — E663 Overweight: Secondary | ICD-10-CM | POA: Insufficient documentation

## 2018-12-29 DIAGNOSIS — E785 Hyperlipidemia, unspecified: Secondary | ICD-10-CM | POA: Insufficient documentation

## 2018-12-29 DIAGNOSIS — E1169 Type 2 diabetes mellitus with other specified complication: Secondary | ICD-10-CM | POA: Insufficient documentation

## 2018-12-29 NOTE — Assessment & Plan Note (Addendum)
Stable.  Walking without any angina or heart failure.  We are using diltiazem in lieu of beta-blocker.  With preserved EF this is acceptable. Diltiazem is also being used for rate control and does provide antianginal benefit along with pulmonary vasodilation.  Is now only on aspirin and not Plavix.  Is not on statin because of fatigue and myalgia issues in the past.  In the past, blood pressures not been elevated.  If pressures do seem to increase, may want to use ARB (other than losartan).  Would avoid ACE inhibitor with pulmonary issues.

## 2018-12-29 NOTE — Assessment & Plan Note (Signed)
His weight today was stated he is below the "obesity threshold".  He has lost a lot of weight in part because of exercise, mostly because of altered diet.  Just not eating as much.  I would hope that this would affect his blood pressure, glycemic control and lipids favorably.

## 2018-12-29 NOTE — Assessment & Plan Note (Signed)
He had a brief episode of A. fib postoperatively back in November 2019 but has not had any symptoms of A. fib since.  We decided, especially in light of his GI bleed issues to only use aspirin for anticoagulation.  He is on diltiazem for rate control and anti-angina benefit.

## 2018-12-29 NOTE — Assessment & Plan Note (Signed)
Distant history of flutter ablation in 2006.

## 2018-12-29 NOTE — Assessment & Plan Note (Signed)
He has had labs checked in the interval since I have seen him and still see the results.  LDL was 86 last year in February.  I have not seen any labs since then.  He tells me that he will make sure his PCP sends labs.  I do not see anything from his recent labs in care everywhere.  He is not on a statin because of intolerance.  Was pretty close a year and a half ago.  Would like to see what follow-up labs look like.  Reluctant to use statins because of his fatigue.

## 2018-12-29 NOTE — Assessment & Plan Note (Signed)
He tells me that his blood pressures are usually lower than the are here today when he is at home.  He has been little bit stressed out with caring for his wife.  He is been a follow his blood pressures.  If they do go higher, I probably would consider an ARB plus or minus thiazide diuretic.

## 2018-12-29 NOTE — Assessment & Plan Note (Signed)
It sounds like his dyspnea is pretty much at baseline.  Likely combination between interstitial lung disease and some diastolic dysfunction.  Worried to have exacerbation of symptoms, I think we would then add an ARB and consider diuretic.

## 2018-12-29 NOTE — Assessment & Plan Note (Signed)
He had elevated LVEDP on cath and echo.  Not really having any orthopnea or heart failure symptoms.  He does have some mild edema.  If blood pressures were to increase consistently, would probably consider adding ARB plus or minus chlorthalidone.

## 2018-12-29 NOTE — Assessment & Plan Note (Addendum)
Negative Myoview in 2018.  No anginal symptoms.  No longer on Plavix.  Remains on aspirin alone.  This was because of bleeding issues.

## 2018-12-31 NOTE — Progress Notes (Signed)
@Patient  ID: [name], male    DOB: 26-Feb-1946, 73 y.o.   MRN: [id number]  Chief Complaint  Patient presents with  . Follow-up    ILD yearly follow up     Referring provider: [facility]  HPI:  73 year old male remote smoker (cigars) followed in our office for ILD which favors NSIP.  PMH: Psoriasis, A. fib Smoker/ Smoking History: Remote Smoker - Cigars  Maintenance:  5mg  prednisone Pt of: Dr. Elsworth Soho  01/01/2019  - Visit   73 year old male remote smoker (cigars) is followed in our office for ILD which favors NSIP.  Patient is maintained on 5 mg of prednisone daily.  He is followed by Dr. Elsworth Soho.  Patient is presenting today for his one-year follow-up for management.  Patient has no acute concerns.  He is up-to-date with his flu vaccine.  He would like a refill of the prednisone which he is maintained on.  Patient reports that he has been doing well.  His wife has been undergoing chemotherapy treatments that she recently finished last week.  He has been his wife's primary caregiver.  Patient is also been working aggressively on weight loss.  Patient did have an episode of shingles.  Patient is maintained on Humira for his psoriasis.  He did briefly stop this during his episode of shingles.  Patient has been offered other immunosuppressants for his interstitial lung disease but he is declined these and prefers to be maintained on low-dose prednisone.  Patient has had issues with pulmonary function testing the past.  We will hold off on repeating pulmonary function testing unless absolutely clinically needed.  Patient does carry diagnosis of obstructive sleep apnea he is not maintained on CPAP.  Patient reports that it was mild OSA.  We are monitoring this clinically.  Patient reports no issues with daytime sleepiness or fatigue.     Tests:   05/27/2017-CT chest high-res- compatible with nonspecific interstitial pneumonia, several new small 2 to 4 mm pulmonary nodules  >>>noncontrast chest CT could be considered in 12 months patient is high risk, dilated pulmonary trunk concerning for PAH  11/15/2013-echocardiogram-LV ejection fraction 65 to 70%, mild concentric hypertrophy, PAP pressure 35   FENO:  No results found for: NITRICOXIDE  PFT: PFT Results Latest Ref Rng & Units 04/25/2015  FVC-Pre L 3.07  FVC-Predicted Pre % 60  FVC-Post L 3.10  FVC-Predicted Post % 61  Pre FEV1/FVC % % 83  Post FEV1/FCV % % 83  FEV1-Pre L 2.54  FEV1-Predicted Pre % 68  FEV1-Post L 2.58  DLCO UNC% % 67  DLCO COR %Predicted % 106  TLC L 5.26  TLC % Predicted % 68  RV % Predicted % 27    WALK:  SIX MIN WALK 05/14/2017 06/11/2016 12/06/2014 06/21/2014 01/27/2014  Supplimental Oxygen during Test? (L/min) No No No No No  Tech Comments: - Pt. was able to walk all 3 laps at a fast pace. Denied any SOB or chest pains during walk.  Pt's lowest ambulatory sat was 97 on RA. Pt walked at a fast pace.  - -    Imaging: No results found.  Lab Results:  CBC    Component Value Date/Time   WBC 5.5 08/06/2018 1108   WBC 5.5 06/05/2018 1815   RBC 3.52 (L) 08/06/2018 1108   RBC 4.32 06/05/2018 1815   HGB 8.8 (L) 08/06/2018 1108   HCT 29.2 (L) 08/06/2018 1108   PLT 250 08/06/2018 1108   MCV 83 08/06/2018 1108  MCH 25.0 (L) 08/06/2018 1108   MCH 27.5 06/05/2018 1815   MCHC 30.1 (L) 08/06/2018 1108   MCHC 32.2 06/05/2018 1815   RDW 14.5 08/06/2018 1108   LYMPHSABS 1.3 08/06/2018 1108   MONOABS 0.8 10/29/2017 0503   EOSABS 0.0 08/06/2018 1108   BASOSABS 0.0 08/06/2018 1108    BMET    Component Value Date/Time   NA 134 (L) 06/05/2018 1815   NA 143 05/12/2017 1130   K 3.3 (L) 06/05/2018 1815   CL 99 06/05/2018 1815   CO2 22 06/05/2018 1815   GLUCOSE 140 (H) 06/05/2018 1815   BUN 10 06/05/2018 1815   BUN 14 05/12/2017 1130   CREATININE 0.99 06/05/2018 1815   CALCIUM 8.8 (L) 06/05/2018 1815   GFRNONAA >60 06/05/2018 1815   GFRAA >60 06/05/2018 1815    BNP No  results found for: BNP  ProBNP    Component Value Date/Time   PROBNP 76.7 09/27/2013 1556    Specialty Problems      Pulmonary Problems   Allergic rhinitis    Qualifier: Diagnosis of  By: Doy Mince LPN, Megan        UNSPEC ALVEOLAR&PARIETOALVEOLAR PNEUMONOPATHY    Noted in 09/2007-resolved       Obstructive sleep apnea    Pt reports it was mild osa, no cpap        OTHER CHRONIC SINUSITIS    Qualifier: Diagnosis of  By: Linna Darner MD, Gwyndolyn Saxon        ILD (interstitial lung disease) ([place])    Favor NSIP ?related to methotrexate vs hypersensitivity pneumonitis Appears to be somewhat steroid responsive      Bronchitis   Exertional dyspnea      Allergies  Allergen Reactions  . Hydrocodone Shortness Of Breath    In combination with decongestants  . Other Palpitations and Other (See Comments)    ALL DECONGESTANTS - CAUSE PALPITATIONS; THROWS HEART RHYTHM OUT OF BALANCE  . Oxycodone Shortness Of Breath  . Pseudoephedrine Palpitations  . Tramadol Shortness Of Breath and Rash    flushing  . Gemfibrozil     Other reaction(s): Dizziness    Immunization History  Administered Date(s) Administered  . Fluad Quad(high Dose 65+) 11/30/2018  . H1N1 03/18/2008  . Influenza Split 03/27/2011  . Influenza Whole 02/24/2008, 01/12/2009  . Influenza, High Dose Seasonal PF 01/15/2016, 12/30/2016, 01/26/2018  . Influenza,inj,Quad PF,6+ Mos 01/03/2014  . Influenza-Unspecified 01/23/2015  . Pneumococcal Polysaccharide-23 11/30/2018  . Pneumococcal-Unspecified 01/15/2016    Past Medical History:  Diagnosis Date  . Arthritis   . CAD S/P percutaneous coronary angioplasty cardiologist-  dr Ellyn Hack    06/28/16 --PCI with Synergy DES 2.75 mm 20 mm-->m RCA; 07/10/16 PCI with Synergy DES 3.5 mm x 20 mm-->mLAD  . Diverticulosis of colon   . Dyspnea on exertion   . Epiretinal membrane    "epiretinal attachment" (06/28/2016) 10-14-2017 per pt oringinally in both , now only unilateral   .  Frequency of urination   . GERD (gastroesophageal reflux disease)   . Heart murmur   . Hiatal hernia   . History of adenomatous polyp of colon   . History of concussion 08/23/2017   w/o loc--- per pt no residual  . History of kidney stones   . History of rheumatic fever 1958  . History of skin cancer    excision leg;  froze the left arm--- unsure BCC or SCC   . Hyperlipidemia   . Hypertension   . ILD (interstitial lung disease) ([place])  pulmologist-  dr Elsworth Soho--  secondary to methotrexate use --- last PFTs 04-25-2015  mild restriction  . Iron deficiency anemia   . Paroxysmal atrial fibrillation [facility]) cardiologist-  dr harding/  EP-- dr allred   a. 11/2003 Tikosyn initiated - subsequently d/c'd;  b. 2006 Loco for Afib @ [facility];  c. 09/2011 Echo: EF 60-65%, Gr 2 DD;  d. DCCV 10/2011 , 04/2012, 12/ 2014, & 02/ 2015;  e. RFCA 01-13-2014  . Psoriasis    rheumatologist and dermologist at [facility] --  dx 1969  . Right ureteral stone   . S/P drug eluting coronary stent placement    06-28-2016  x1 to mRCA;  07-10-2016 x1 to mLAD  . Seasonal allergies   . Type 2 diabetes mellitus ([place])   . Urgency of urination   . Wears hearing aid in both ears     Tobacco History: Social History   Tobacco Use  Smoking Status Former Smoker  . Years: 10.00  . Types: Cigars  . Quit date: 09/29/1997  . Years since quitting: 21.2  Smokeless Tobacco Former Systems developer  . Types: Snuff, Chew   Counseling given: Yes   Continue to not smoke  Outpatient Encounter Medications as of 01/01/2019  Medication Sig  . acetaminophen (TYLENOL) 500 MG tablet Take 1,000 mg by mouth every 6 (six) hours as needed (for pain.).  . Adalimumab (HUMIRA) 40 MG/0.4ML PSKT Inject 40 mg into the skin every 14 (fourteen) days.  . Biotin 1000 MCG tablet Take 1,000 mcg by mouth at bedtime.  Marland Kitchen CALCIUM PO Take 1,200 mg by mouth daily.  . cetirizine (ZYRTEC) 10 MG tablet Take 10 mg by mouth daily with breakfast.   . clobetasol cream  (TEMOVATE) [id number] % Apply 1 application topically 2 (two) times daily as needed (for psoriasis).  Marland Kitchen diltiazem (CARDIZEM CD) 180 MG 24 hr capsule Take 1 capsule (180 mg total) by mouth daily. (Patient taking differently: Take 180 mg by mouth at bedtime. )  . ferrous sulfate 325 (65 FE) MG EC tablet Take 325 mg by mouth See admin instructions. Take one tablet (325 mg) by mouth  DAILY STARTING 08/07/18  . Flurandrenolide (CORDRAN) 4 MCG/SQCM TAPE Apply 1 each topically See admin instructions. Wrap sores caused by psoriasis as needed until wounds heal.  . gabapentin (NEURONTIN) 300 MG capsule Take 300-600 mg by mouth See admin instructions. Take one capsule (300 mg) by mouth every morning, take two capsules (600 mg) at night  . metFORMIN (GLUCOPHAGE) 500 MG tablet Take 500 mg by mouth 2 (two) times daily with a meal.   . nitroGLYCERIN (NITROSTAT) 0.4 MG SL tablet Place 1 tablet (0.4 mg total) under the tongue every 5 (five) minutes as needed for chest pain.  . pantoprazole (PROTONIX) 40 MG tablet TAKE 1 TABLET BY MOUTH ONCE DAILY (Patient taking differently: Take 40 mg by mouth daily before breakfast. )  . Polyethyl Glycol-Propyl Glycol (SYSTANE OP) Place 1 drop into both eyes daily.  Vladimir Faster Glycol-Propyl Glycol (SYSTANE) 0.4-0.3 % GEL ophthalmic gel Place 1 application into both eyes at bedtime.   . predniSONE (DELTASONE) 5 MG tablet Take 1 tablet (5 mg) daily in the morning  . rosuvastatin (CRESTOR) 20 MG tablet Take 1 tablet (20 mg total) by mouth daily.  Marland Kitchen zolpidem (AMBIEN) 10 MG tablet Take 10 mg by mouth at bedtime as needed for sleep.  . [DISCONTINUED] predniSONE (DELTASONE) 5 MG tablet Take 1 tablet by mouth once daily with breakfast  . aspirin  EC 81 MG tablet Take 1 tablet (81 mg total) by mouth daily. Do not start until MAY 8,2020 (Patient not taking: Reported on 01/01/2019)   Facility-Administered Encounter Medications as of 01/01/2019  Medication  . lidocaine (cardiac) 100 mg/13ml (XYLOCAINE)  20 MG/ML injection 2%  . propofol (DIPRIVAN) 10 mg/mL bolus/IV push     Review of Systems  Review of Systems  Constitutional: Negative for activity change, chills, fatigue, fever and unexpected weight change.  HENT: Negative for postnasal drip, rhinorrhea, sinus pressure, sinus pain and sore throat.   Eyes: Negative.   Respiratory: Negative for cough, shortness of breath and wheezing.   Cardiovascular: Negative for chest pain and palpitations.  Gastrointestinal: Negative for constipation, diarrhea, nausea and vomiting.  Endocrine: Negative.   Genitourinary: Negative.   Musculoskeletal: Negative.   Skin: Negative.   Neurological: Negative for dizziness and headaches.  Psychiatric/Behavioral: Negative.  Negative for dysphoric mood. The patient is not nervous/anxious.   All other systems reviewed and are negative.    Physical Exam  BP (!) 144/72 (BP Location: Left Arm, Cuff Size: Normal)   Pulse 76   Temp (!) 97.1 F (36.2 C) (Temporal)   Ht 6\' 3"  (1.905 m)   Wt 235 lb 9.6 oz (106.9 kg)   SpO2 96%   BMI 29.45 kg/m   Wt Readings from Last 5 Encounters:  01/01/19 235 lb 9.6 oz (106.9 kg)  12/21/18 235 lb (106.6 kg)  02/17/18 254 lb 6.4 oz (115.4 kg)  02/12/18 254 lb (115.2 kg)  02/09/18 253 lb 6.4 oz (114.9 kg)    BMI Readings from Last 5 Encounters:  01/01/19 29.45 kg/m  12/21/18 29.37 kg/m  02/17/18 31.80 kg/m  02/12/18 31.75 kg/m  02/09/18 31.67 kg/m     Physical Exam Vitals signs and nursing note reviewed.  Constitutional:      General: He is not in acute distress.    Appearance: Normal appearance. He is obese.  HENT:     Head: Normocephalic and atraumatic.     Right Ear: Hearing and external ear normal.     Left Ear: Hearing and external ear normal.     Ears:     Comments: Hearing aids bilaterally    Nose: Nose normal. No mucosal edema or rhinorrhea.     Right Turbinates: Not enlarged.     Left Turbinates: Not enlarged.     Mouth/Throat:      Mouth: Mucous membranes are moist.     Dentition: Abnormal dentition. Does not have dentures. Dental caries present.     Pharynx: Oropharynx is clear. No oropharyngeal exudate.  Eyes:     Pupils: Pupils are equal, round, and reactive to light.  Neck:     Musculoskeletal: Normal range of motion.  Cardiovascular:     Rate and Rhythm: Normal rate and regular rhythm.     Pulses: Normal pulses.     Heart sounds: Normal heart sounds. No murmur.  Pulmonary:     Effort: Pulmonary effort is normal.     Breath sounds: Rales (Bibasilar crackles) present. No decreased breath sounds or wheezing.  Abdominal:     General: Bowel sounds are normal. There is no distension.     Palpations: Abdomen is soft.     Tenderness: There is no abdominal tenderness.  Musculoskeletal:     Right lower leg: No edema.     Left lower leg: No edema.  Lymphadenopathy:     Cervical: No cervical adenopathy.  Skin:    General: Skin  is warm and dry.     Capillary Refill: Capillary refill takes less than 2 seconds.     Findings: No erythema or rash.  Neurological:     General: No focal deficit present.     Mental Status: He is alert and oriented to person, place, and time.     Motor: No weakness.     Coordination: Coordination normal.     Gait: Gait is intact. Gait normal.  Psychiatric:        Mood and Affect: Mood normal.        Behavior: Behavior normal. Behavior is cooperative.        Thought Content: Thought content normal.        Judgment: Judgment normal.       Assessment & Plan:   Allergic rhinitis Plan: Continue Zyrtec  ILD (interstitial lung disease) ([place]) Plan: Continue prednisone 5 mg daily We will have patient follow-up in 6 months where we can decide if we would like to repeat CT imaging to compare to 2019 CT imaging We will continue to do for pulmonary function testing as patient is asymptomatic and feels to be clinically stable   Obstructive sleep apnea Never used CPAP   Plan: We  will continue to monitor clinically Great job working on [employer] loss  Overweight (BMI 25.0-29.9) Plan: Continue to work on maintaining healthy weight I congratulated the patient on his hard work on losing weight over the last year    Return in about 6 months (around 07/01/2019), or if symptoms worsen or fail to improve, for Follow up with Dr. Elsworth Soho.   Lauraine Rinne, NP 01/01/2019   This appointment was 26 minutes long with over 50% of the time in direct face-to-face patient care, assessment, plan of care, and follow-up.

## 2019-01-01 ENCOUNTER — Encounter: Payer: Self-pay | Admitting: Pulmonary Disease

## 2019-01-01 ENCOUNTER — Other Ambulatory Visit: Payer: Self-pay

## 2019-01-01 ENCOUNTER — Ambulatory Visit (INDEPENDENT_AMBULATORY_CARE_PROVIDER_SITE_OTHER): Payer: Medicare Other | Admitting: Pulmonary Disease

## 2019-01-01 VITALS — BP 144/72 | HR 76 | Temp 97.1°F | Ht 75.0 in | Wt 235.6 lb

## 2019-01-01 DIAGNOSIS — J309 Allergic rhinitis, unspecified: Secondary | ICD-10-CM

## 2019-01-01 DIAGNOSIS — G4733 Obstructive sleep apnea (adult) (pediatric): Secondary | ICD-10-CM | POA: Diagnosis not present

## 2019-01-01 DIAGNOSIS — J849 Interstitial pulmonary disease, unspecified: Secondary | ICD-10-CM | POA: Diagnosis not present

## 2019-01-01 DIAGNOSIS — E663 Overweight: Secondary | ICD-10-CM | POA: Diagnosis not present

## 2019-01-01 MED ORDER — PREDNISONE 5 MG PO TABS: ORAL_TABLET | ORAL | 4 refills | Status: DC

## 2019-01-01 NOTE — Assessment & Plan Note (Addendum)
Plan: Continue prednisone 5 mg daily We will have patient follow-up in 6 months where we can decide if we would like to repeat CT imaging to compare to 2019 CT imaging We will continue to do for pulmonary function testing as patient is asymptomatic and feels to be clinically stable, as patient has had issues completing pulmonary function testing in the past.

## 2019-01-01 NOTE — Patient Instructions (Addendum)
You were seen today by Lauraine Rinne, NP  for:   1. ILD (interstitial lung disease) ([facility])  - predniSONE (DELTASONE) 5 MG tablet; Take 1 tablet (5 mg) daily in the morning  Dispense: 90 tablet; Refill: 4  Can increase prednisone to 10 mg daily if needed having worsened shortness of breath.  If you are having to maintain this dose or go above 10 mg daily then please contact our office  We will bring you back in office in 6 months where we can decide whether or not work and a repeat CT imaging to compare to 2019 CT chest  2. Obstructive sleep apnea  We will continue to monitor you clinically Great job on working on your weight  3. Overweight (BMI 25.0-29.9)  Awesome job with your weight loss exhalation Elta [place] keep up the great work  4. Allergic rhinitis, unspecified seasonality, unspecified trigger  Continue Zyrtec daily   We recommend today:  No orders of the defined types were placed in this encounter.  No orders of the defined types were placed in this encounter.  Meds ordered this encounter  Medications  . predniSONE (DELTASONE) 5 MG tablet    Sig: Take 1 tablet (5 mg) daily in the morning    Dispense:  90 tablet    Refill:  4    Must have ov for further refill    Follow Up:    Return in about 6 months (around 07/01/2019), or if symptoms worsen or fail to improve, for Follow up with Dr. Elsworth Soho.   Please do your part to reduce the spread of COVID-19:      Reduce your risk of any infection  and COVID19 by using the similar precautions used for avoiding the common cold or flu:  Marland Kitchen Wash your hands often with soap and warm water for at least 20 seconds.  If soap and water are not readily available, use an alcohol-based hand sanitizer with at least 60% alcohol.  . If coughing or sneezing, cover your mouth and nose by coughing or sneezing into the elbow areas of your shirt or coat, into a tissue or into your sleeve (not your hands). Langley Gauss A MASK when in public  . Avoid  shaking hands with others and consider head nods or verbal greetings only. . Avoid touching your eyes, nose, or mouth with unwashed hands.  . Avoid close contact with people who are sick. . Avoid places or events with large numbers of people in one location, like concerts or sporting events. . If you have some symptoms but not all symptoms, continue to monitor at home and seek medical attention if your symptoms worsen. . If you are having a medical emergency, call 911.   [place] / e-Visit: [email or web address]         [facility]: [facility]: [id number]                   [facility]: [id number]     It is flu season:   >>> Best ways to protect herself from the flu: Receive the yearly flu vaccine, practice good hand hygiene washing with soap and also using hand sanitizer when available, eat a nutritious meals, get adequate rest, hydrate appropriately   Please contact the office if your symptoms worsen or you have concerns that you are not improving.   Thank you for choosing [facility] for your  healthcare, and for allowing [place] to partner with you on your healthcare journey. I am thankful to be able to provide care to you today.   Wyn Quaker FNP-C

## 2019-01-01 NOTE — Assessment & Plan Note (Signed)
Plan: Continue to work on maintaining healthy weight I congratulated the patient on his hard work on losing weight over the last year

## 2019-01-01 NOTE — Assessment & Plan Note (Signed)
Plan: Continue Zyrtec 

## 2019-01-01 NOTE — Assessment & Plan Note (Signed)
Never used CPAP   Plan: We will continue to monitor clinically Great job working on [employer] loss

## 2019-01-04 DIAGNOSIS — M47816 Spondylosis without myelopathy or radiculopathy, lumbar region: Secondary | ICD-10-CM | POA: Diagnosis not present

## 2019-01-04 DIAGNOSIS — M5136 Other intervertebral disc degeneration, lumbar region: Secondary | ICD-10-CM | POA: Diagnosis not present

## 2019-01-04 DIAGNOSIS — M48061 Spinal stenosis, lumbar region without neurogenic claudication: Secondary | ICD-10-CM | POA: Diagnosis not present

## 2019-01-04 DIAGNOSIS — M4310 Spondylolisthesis, site unspecified: Secondary | ICD-10-CM | POA: Diagnosis not present

## 2019-01-04 DIAGNOSIS — G894 Chronic pain syndrome: Secondary | ICD-10-CM | POA: Diagnosis not present

## 2019-01-04 DIAGNOSIS — M4300 Spondylolysis, site unspecified: Secondary | ICD-10-CM | POA: Diagnosis not present

## 2019-01-04 DIAGNOSIS — M461 Sacroiliitis, not elsewhere classified: Secondary | ICD-10-CM | POA: Diagnosis not present

## 2019-01-04 DIAGNOSIS — M545 Low back pain: Secondary | ICD-10-CM | POA: Diagnosis not present

## 2019-01-18 DIAGNOSIS — M4726 Other spondylosis with radiculopathy, lumbar region: Secondary | ICD-10-CM | POA: Diagnosis not present

## 2019-01-18 DIAGNOSIS — M5136 Other intervertebral disc degeneration, lumbar region: Secondary | ICD-10-CM | POA: Diagnosis not present

## 2019-01-20 DIAGNOSIS — M5136 Other intervertebral disc degeneration, lumbar region: Secondary | ICD-10-CM | POA: Diagnosis not present

## 2019-01-20 DIAGNOSIS — M47816 Spondylosis without myelopathy or radiculopathy, lumbar region: Secondary | ICD-10-CM | POA: Diagnosis not present

## 2019-01-20 DIAGNOSIS — G894 Chronic pain syndrome: Secondary | ICD-10-CM | POA: Diagnosis not present

## 2019-01-20 DIAGNOSIS — M461 Sacroiliitis, not elsewhere classified: Secondary | ICD-10-CM | POA: Diagnosis not present

## 2019-02-26 DIAGNOSIS — H35033 Hypertensive retinopathy, bilateral: Secondary | ICD-10-CM | POA: Diagnosis not present

## 2019-02-26 DIAGNOSIS — H35373 Puckering of macula, bilateral: Secondary | ICD-10-CM | POA: Diagnosis not present

## 2019-02-26 DIAGNOSIS — H11823 Conjunctivochalasis, bilateral: Secondary | ICD-10-CM | POA: Diagnosis not present

## 2019-02-26 DIAGNOSIS — H524 Presbyopia: Secondary | ICD-10-CM | POA: Diagnosis not present

## 2019-02-26 DIAGNOSIS — E119 Type 2 diabetes mellitus without complications: Secondary | ICD-10-CM | POA: Diagnosis not present

## 2019-02-26 DIAGNOSIS — H52203 Unspecified astigmatism, bilateral: Secondary | ICD-10-CM | POA: Diagnosis not present

## 2019-02-26 DIAGNOSIS — H43811 Vitreous degeneration, right eye: Secondary | ICD-10-CM | POA: Diagnosis not present

## 2019-02-26 DIAGNOSIS — H04123 Dry eye syndrome of bilateral lacrimal glands: Secondary | ICD-10-CM | POA: Diagnosis not present

## 2019-02-26 DIAGNOSIS — H5203 Hypermetropia, bilateral: Secondary | ICD-10-CM | POA: Diagnosis not present

## 2019-02-26 DIAGNOSIS — H35342 Macular cyst, hole, or pseudohole, left eye: Secondary | ICD-10-CM | POA: Diagnosis not present

## 2019-02-26 DIAGNOSIS — Z7984 Long term (current) use of oral hypoglycemic drugs: Secondary | ICD-10-CM | POA: Diagnosis not present

## 2019-02-26 DIAGNOSIS — H43392 Other vitreous opacities, left eye: Secondary | ICD-10-CM | POA: Diagnosis not present

## 2019-03-15 DIAGNOSIS — R29898 Other symptoms and signs involving the musculoskeletal system: Secondary | ICD-10-CM | POA: Diagnosis not present

## 2019-03-15 DIAGNOSIS — M47816 Spondylosis without myelopathy or radiculopathy, lumbar region: Secondary | ICD-10-CM | POA: Diagnosis not present

## 2019-03-29 ENCOUNTER — Other Ambulatory Visit: Payer: Self-pay

## 2019-03-29 MED ORDER — ROSUVASTATIN CALCIUM 20 MG PO TABS: 20.0000 mg | ORAL_TABLET | Freq: Every day | ORAL | 10 refills | Status: DC

## 2019-04-16 DIAGNOSIS — N200 Calculus of kidney: Secondary | ICD-10-CM | POA: Diagnosis not present

## 2019-04-16 DIAGNOSIS — R300 Dysuria: Secondary | ICD-10-CM | POA: Diagnosis not present

## 2019-04-16 DIAGNOSIS — R350 Frequency of micturition: Secondary | ICD-10-CM | POA: Diagnosis not present

## 2019-04-30 DIAGNOSIS — R300 Dysuria: Secondary | ICD-10-CM | POA: Diagnosis not present

## 2019-04-30 DIAGNOSIS — N3000 Acute cystitis without hematuria: Secondary | ICD-10-CM | POA: Diagnosis not present

## 2019-04-30 DIAGNOSIS — N200 Calculus of kidney: Secondary | ICD-10-CM | POA: Diagnosis not present

## 2019-04-30 DIAGNOSIS — N401 Enlarged prostate with lower urinary tract symptoms: Secondary | ICD-10-CM | POA: Diagnosis not present

## 2019-05-10 DIAGNOSIS — Z1159 Encounter for screening for other viral diseases: Secondary | ICD-10-CM | POA: Diagnosis not present

## 2019-05-10 DIAGNOSIS — Z20828 Contact with and (suspected) exposure to other viral communicable diseases: Secondary | ICD-10-CM | POA: Diagnosis not present

## 2019-05-12 DIAGNOSIS — E1142 Type 2 diabetes mellitus with diabetic polyneuropathy: Secondary | ICD-10-CM | POA: Diagnosis not present

## 2019-05-12 DIAGNOSIS — M216X2 Other acquired deformities of left foot: Secondary | ICD-10-CM | POA: Diagnosis not present

## 2019-05-12 DIAGNOSIS — E119 Type 2 diabetes mellitus without complications: Secondary | ICD-10-CM | POA: Diagnosis not present

## 2019-05-14 DIAGNOSIS — N401 Enlarged prostate with lower urinary tract symptoms: Secondary | ICD-10-CM | POA: Diagnosis not present

## 2019-05-14 DIAGNOSIS — R350 Frequency of micturition: Secondary | ICD-10-CM | POA: Diagnosis not present

## 2019-05-14 DIAGNOSIS — N200 Calculus of kidney: Secondary | ICD-10-CM | POA: Diagnosis not present

## 2019-05-28 ENCOUNTER — Other Ambulatory Visit: Payer: Self-pay | Admitting: Cardiology

## 2019-06-04 DIAGNOSIS — N401 Enlarged prostate with lower urinary tract symptoms: Secondary | ICD-10-CM | POA: Diagnosis not present

## 2019-06-04 DIAGNOSIS — R351 Nocturia: Secondary | ICD-10-CM | POA: Diagnosis not present

## 2019-06-04 DIAGNOSIS — N200 Calculus of kidney: Secondary | ICD-10-CM | POA: Diagnosis not present

## 2019-09-07 ENCOUNTER — Emergency Department (HOSPITAL_COMMUNITY)
Admission: EM | Admit: 2019-09-07 | Discharge: 2019-09-07 | Disposition: A | Discharge status: Home / Self Care | Payer: Medicare Other | Attending: Emergency Medicine | Admitting: Emergency Medicine

## 2019-09-07 ENCOUNTER — Emergency Department (HOSPITAL_COMMUNITY): Payer: Medicare Other

## 2019-09-07 ENCOUNTER — Other Ambulatory Visit: Payer: Self-pay

## 2019-09-07 ENCOUNTER — Encounter (HOSPITAL_COMMUNITY): Payer: Self-pay | Admitting: Emergency Medicine

## 2019-09-07 DIAGNOSIS — I10 Essential (primary) hypertension: Secondary | ICD-10-CM | POA: Diagnosis not present

## 2019-09-07 DIAGNOSIS — R110 Nausea: Secondary | ICD-10-CM | POA: Diagnosis not present

## 2019-09-07 DIAGNOSIS — E119 Type 2 diabetes mellitus without complications: Secondary | ICD-10-CM | POA: Diagnosis not present

## 2019-09-07 DIAGNOSIS — Z7984 Long term (current) use of oral hypoglycemic drugs: Secondary | ICD-10-CM | POA: Diagnosis not present

## 2019-09-07 DIAGNOSIS — Z85828 Personal history of other malignant neoplasm of skin: Secondary | ICD-10-CM | POA: Diagnosis not present

## 2019-09-07 DIAGNOSIS — Z79899 Other long term (current) drug therapy: Secondary | ICD-10-CM | POA: Insufficient documentation

## 2019-09-07 DIAGNOSIS — R1032 Left lower quadrant pain: Secondary | ICD-10-CM | POA: Insufficient documentation

## 2019-09-07 DIAGNOSIS — I2510 Atherosclerotic heart disease of native coronary artery without angina pectoris: Secondary | ICD-10-CM | POA: Diagnosis not present

## 2019-09-07 DIAGNOSIS — Z87442 Personal history of urinary calculi: Secondary | ICD-10-CM | POA: Insufficient documentation

## 2019-09-07 DIAGNOSIS — Z87891 Personal history of nicotine dependence: Secondary | ICD-10-CM | POA: Insufficient documentation

## 2019-09-07 DIAGNOSIS — N134 Hydroureter: Secondary | ICD-10-CM | POA: Insufficient documentation

## 2019-09-07 DIAGNOSIS — R1031 Right lower quadrant pain: Secondary | ICD-10-CM | POA: Diagnosis present

## 2019-09-07 DIAGNOSIS — N132 Hydronephrosis with renal and ureteral calculous obstruction: Secondary | ICD-10-CM | POA: Insufficient documentation

## 2019-09-07 DIAGNOSIS — N201 Calculus of ureter: Secondary | ICD-10-CM

## 2019-09-07 DIAGNOSIS — R319 Hematuria, unspecified: Secondary | ICD-10-CM | POA: Diagnosis not present

## 2019-09-07 LAB — BASIC METABOLIC PANEL
Anion gap: 13 (ref 5–15)
BUN: 10 mg/dL (ref 8–23)
CO2: 23 mmol/L (ref 22–32)
Calcium: 10.6 mg/dL — ABNORMAL HIGH (ref 8.9–10.3)
Chloride: 103 mmol/L (ref 98–111)
Creatinine, Ser: 0.97 mg/dL (ref 0.61–1.24)
GFR calc Af Amer: >60 mL/min (ref >60)
GFR calc non Af Amer: >60 mL/min (ref >60)
Glucose, Bld: 121 mg/dL — ABNORMAL HIGH (ref 70–99)
Potassium: 4.1 mmol/L (ref 3.5–5.1)
Sodium: 139 mmol/L (ref 135–145)

## 2019-09-07 LAB — URINALYSIS, ROUTINE W REFLEX MICROSCOPIC
Bilirubin Urine: NEGATIVE
Glucose, UA: NEGATIVE mg/dL
Ketones, ur: 20 mg/dL — AB
Leukocytes,Ua: NEGATIVE
Nitrite: NEGATIVE
Protein, ur: 30 mg/dL — AB
RBC / HPF: >50 RBC/hpf — ABNORMAL HIGH (ref 0–5)
Specific Gravity, Urine: 1.011 (ref 1.005–1.030)
pH: 9 — ABNORMAL HIGH (ref 5.0–8.0)

## 2019-09-07 LAB — CBC
HCT: 39 % (ref 39.0–52.0)
Hemoglobin: 12.8 g/dL — ABNORMAL LOW (ref 13.0–17.0)
MCH: 28.8 pg (ref 26.0–34.0)
MCHC: 32.8 g/dL (ref 30.0–36.0)
MCV: 87.6 fL (ref 80.0–100.0)
Platelets: 214 10*3/uL (ref 150–400)
RBC: 4.45 MIL/uL (ref 4.22–5.81)
RDW: 13.2 % (ref 11.5–15.5)
WBC: 8.3 10*3/uL (ref 4.0–10.5)
nRBC: 0 % (ref 0.0–0.2)

## 2019-09-07 MED ORDER — HYDROMORPHONE HCL 2 MG PO TABS: 1.0000 mg | ORAL_TABLET | ORAL | 0 refills | Status: DC | PRN

## 2019-09-07 MED ORDER — FENTANYL CITRATE (PF) 100 MCG/2ML IJ SOLN
50.0000 ug | Freq: Once | INTRAMUSCULAR | Status: AC
  Administered 2019-09-07: 50 ug via INTRAVENOUS
  Filled 2019-09-07: qty 2

## 2019-09-07 MED ORDER — ONDANSETRON 4 MG PO TBDP
4.0000 mg | ORAL_TABLET | Freq: Once | ORAL | Status: AC
  Administered 2019-09-07: 4 mg via ORAL
  Filled 2019-09-07: qty 1

## 2019-09-07 MED ORDER — ONDANSETRON 4 MG PO TBDP: 4.0000 mg | ORAL_TABLET | Freq: Three times a day (TID) | ORAL | 0 refills | Status: DC | PRN

## 2019-09-07 NOTE — Discharge Instructions (Addendum)
Incidental finding on CT- infrarenal 2.7cm ectatic abdominal aorta, at risk for aneurysm development. Recommend follow-up aortic ultrasound in 5 years. This can be monitored by your PCP.   Take Dilaudid as needed as prescribed for pain. This can cause constipation, take Colace and Miralax as needed to keep stools soft. Do NOT drive or operate machinery while taking Dilaudid. Use caution walking as narcotic medications can make you at risk for falls.  Take Zofran as needed as prescribed for nausea and vomiting.   Follow up with your urologist- call tomorrow to schedule an appointment.  Return to the ER for fever or pain/vomiting not controlled with medications.

## 2019-09-07 NOTE — ED Provider Notes (Signed)
[facility] EMERGENCY DEPARTMENT Provider Note   CSN: [id number] Arrival date & time: 09/07/19  1727     History Chief Complaint  Patient presents with   Nephrolithiasis    Kenneth [name] is a 74 y.o. male.  74 year old male with history of kidney stones presents with complaint of flank pain similar to prior kidney stones.  Patient reports pain to both flanks onset today with hematuria and nausea.  Denies fevers, vomiting, difficulty urinating.  Reports constipation otherwise no blood in stools or abdominal pain.  Patient is followed by Dr. Alyson Ingles with urology, does take Flomax daily.  No other complaints or concerns today. Not anticoagulated.        Past Medical History:  Diagnosis Date   Arthritis    CAD S/P percutaneous coronary angioplasty cardiologist-  dr Ellyn Hack    06/28/16 --PCI with Synergy DES 2.75 mm 20 mm-->m RCA; 07/10/16 PCI with Synergy DES 3.5 mm x 20 mm-->mLAD   Diverticulosis of colon    Dyspnea on exertion    Epiretinal membrane    "epiretinal attachment" (06/28/2016) 10-14-2017 per pt oringinally in both , now only unilateral    Frequency of urination    GERD (gastroesophageal reflux disease)    Heart murmur    Hiatal hernia    History of adenomatous polyp of colon    History of concussion 08/23/2017   w/o loc--- per pt no residual   History of kidney stones    History of rheumatic fever 1958   History of skin cancer    excision leg;  froze the left arm--- unsure BCC or SCC    Hyperlipidemia    Hypertension    ILD (interstitial lung disease) ([place])    pulmologist-  dr Elsworth Soho--  secondary to methotrexate use --- last PFTs 04-25-2015  mild restriction   Iron deficiency anemia    Paroxysmal atrial fibrillation [facility]) cardiologist-  dr harding/  EP-- dr Rayann Heman   a. 11/2003 Tikosyn initiated - subsequently d/c'd;  b. 2006 RFCA for Afib @ [facility];  c. 09/2011 Echo: EF 60-65%, Gr 2 DD;  d. DCCV 10/2011 ,  04/2012, 12/ 2014, & 02/ 2015;  e. RFCA 01-13-2014   Psoriasis    rheumatologist and dermologist at [facility] --  dx 1969   Right ureteral stone    S/P drug eluting coronary stent placement    06-28-2016  x1 to mRCA;  07-10-2016 x1 to mLAD   Seasonal allergies    Type 2 diabetes mellitus ([place])    Urgency of urination    Wears hearing aid in both ears     Patient Active Problem List   Diagnosis Date Noted   Overweight (BMI 25.0-29.9) 12/29/2018   Dyslipidemia associated with type 2 diabetes mellitus ([place]) 12/29/2018   Atrial tachycardia ([place]) 02/12/2018   Postprocedural fever 10/29/2017   Right ureteral stone 10/27/2017   Controlled type 2 diabetes mellitus without complication, without long-term current use of insulin ([place]) 05/12/2017   Essential hypertension 05/12/2017   Iron deficiency anemia 11/11/2016   Hyperlipidemia with target low density lipoprotein (LDL) cholesterol less than 70 mg/dL 11/11/2016   CAD S/P percutaneous coronary angioplasty 06/28/2016   Coronary artery disease involving native coronary artery with angina pectoris ([place]) 06/27/2016   Exertional dyspnea 06/27/2016   Bronchitis 07/03/2015   ILD (interstitial lung disease) ([place]) 01/27/2014   Atypical atrial flutter ([place]) 01/13/2014   Hx of colonic polyp [id number]   Diastolic CHF, chronic ([place]) 05/22/2013  OTHER CHRONIC SINUSITIS 05/31/2010   NEPHROLITHIASIS, HX OF 02/02/2008   PERSISTENT DISORDER INITIATING/MAINTAINING SLEEP 12/16/2007   Obstructive sleep apnea 10/07/2007   UNSPEC ALVEOLAR&PARIETOALVEOLAR PNEUMONOPATHY 10/02/2007   Allergic rhinitis 07/24/2007   Postoperative atrial fibrillation ([place]) 07/06/2007    Past Surgical History:  Procedure Laterality Date   APPENDECTOMY  1996   ATRIAL FIBRILLATION ABLATION N/A 01/13/2014   repeat PVI, also left atrial ablation performed with successfull ablation of LA flutter by Dr Rayann Heman   ATRIAL FIBRILLATION ABLATION   08/2004   Dr Rolland Porter at [facility] [place])   [place]  06-30-2003  dr Lia Foyer   no significant coronary obstruction, well preserved LVF, recurrent afib   CARDIOVASCULAR STRESS TEST  10-22-2016    dr harding   Low risk nuclear study w/ no ischemia/  normal LV function and wall motion ,  nuclear stress ef 57%   CARDIOVERSION N/A 05/22/2013   Procedure: CARDIOVERSION;  Surgeon: [name], MD;  Location: [place];  Service: Cardiovascular;  Laterality: N/A;   CARDIOVERSION N/A 10/23/2011   Procedure: CARDIOVERSION;  Surgeon: Deboraha Sprang, MD;  Location: [facility] CATH LAB;  Service: Cardiovascular;  Laterality: N/A;   CARDIOVERSION N/A 05/01/2012   Procedure: CARDIOVERSION;  Surgeon: Deboraha Sprang, MD;  Location: [facility] CATH LAB;  Service: Cardiovascular;  Laterality: N/A;   CARPAL TUNNEL RELEASE Right 1980s   CATARACT EXTRACTION W/ INTRAOCULAR LENS  IMPLANT, BILATERAL Bilateral    CATARACT EXTRACTION W/ INTRAOCULAR LENS  IMPLANT, BILATERAL  2012  approx.   CORONARY STENT INTERVENTION N/A 06/28/2016   Procedure: Coronary Stent Intervention;  Surgeon: Leonie Man, MD;  Location: [place] CV LAB;  Service: Cardiovascular;  Laterality: mRCA PCI -Synergy DES 2.75 m x 20 mm (3.1 mm)   CORONARY STENT INTERVENTION N/A 07/10/2016   Procedure: Coronary Stent Intervention;  Surgeon: Leonie Man, MD;  Location: [place] CV LAB;  Service: Cardiovascular: mLAD PCI Synergy DES 3.5 mm x 20 mm)   CYSTOSCOPY W/ STONE MANIPULATION  2010   Patient states he has had several kidney stones up until aroune 2010, none since (06/28/2016)   [place], URETEROSCOPY AND STENT PLACEMENT Right 10/16/2017   Procedure: CYSTOSCOPY WITH RIGHT RETROGRADE RIGHT URETEROSCOPY AND STONE BASKET EXTRACTION;  Surgeon: Irine Seal, MD;  Location: [facility];  Service: Urology;  Laterality: Right;   CYSTOSCOPY/RETROGRADE/URETEROSCOPY/STONE EXTRACTION WITH BASKET Right  10/27/2017   Procedure: CYSTOSCOPY/RETROGRADE/URETEROSCOPY/STONE EXTRACTION WITH BASKET/URETERAL STENT PLACEMENT and laser;  Surgeon: Irine Seal, MD;  Location: [facility];  Service: Urology;  Laterality: Right;   HOLMIUM LASER APPLICATION Right [id number]   Procedure: POSSIBLE HOLMIUM LASER APPLICATION;  Surgeon: Irine Seal, MD;  Location: [facility];  Service: Urology;  Laterality: Right;   INTRAVASCULAR PRESSURE WIRE/FFR STUDY N/A 06/28/2016   Procedure: Intravascular Pressure Wire/FFR Study;  Surgeon: Leonie Man, MD;  Location: [facility] CV LAB;  Service: Cardiovascular: FFR of mLAD ~65% lesion = 0.75 post --> Staged PCI   NASAL SINUS SURGERY     x 2   PROXIMAL INTERPHALANGEAL FUSION (PIP) Left 01-05-2001    dr graves   correction clawtoe and extensor tendon lengthening   RIGHT/LEFT HEART CATH AND CORONARY ANGIOGRAPHY N/A 06/28/2016   Procedure: Right/Left Heart Cath and Coronary Angiography;  Surgeon: Leonie Man, MD;  Location: [place] CV LAB;  Service: Cardiovascular: mRCA 99% (TIMI 2), mLAD ~65% (FFR 0.75), OM3 55%. mild Pulm HTN. Mod LVEDP elevation. EF 55-65%.   ROTATOR CUFF  REPAIR Bilateral last one 2014   TONSILLECTOMY  age 51   TRANSTHORACIC ECHOCARDIOGRAM  11/15/2013   ef 65-70%,  due to Afib unable to evaluate LVDF/  AV sclerosis without stenosis or regurg./  MV calcification without stenosis or regurg/  ascending aorta mild dilated, 25mm) ,  mild to moderate LAE/  mild Tr/  RVSP 51mmHg   URETEROSCOPIC LASER LITHOTRIPSY STONE EXTRACTIONS/  STENT PLACEMENT Bilateral 09-10-2007   dr Jeffie Pollock  [facility]   per pt has had several prior ureteroscopic stone extraction since age 1 , last one 09-10-2007       Family History  Problem Relation Age of Onset   Coronary artery disease Mother    Breast cancer Mother    Colon cancer Mother    Heart disease Maternal Grandmother        MI   Stroke Paternal Grandfather        CVA   Coronary artery disease Paternal Grandfather     Esophageal cancer Neg Hx    Stomach cancer Neg Hx    Rectal cancer Neg Hx     Social History   Tobacco Use   Smoking status: Former Smoker    Years: 10.00    Types: Cigars    Quit date: 09/29/1997    Years since quitting: 21.9   Smokeless tobacco: Former Systems developer    Types: Snuff, Chew  Substance Use Topics   Alcohol use: No   Drug use: No    Home Medications Prior to Admission medications   Medication Sig Start Date End Date Taking? Authorizing Provider  acetaminophen (TYLENOL) 500 MG tablet Take 1,000 mg by mouth every 6 (six) hours as needed (for pain.).    [provider]  Adalimumab (HUMIRA) 40 MG/0.4ML PSKT Inject 40 mg into the skin every 14 (fourteen) days.    [provider]  aspirin EC 81 MG tablet Take 1 tablet (81 mg total) by mouth daily. Do not start until MAY 8,2020 Patient not taking: Reported on 01/01/2019 08/07/18   Leonie Man, MD  Biotin 1000 MCG tablet Take 1,000 mcg by mouth at bedtime.    [provider]  CALCIUM PO Take 1,200 mg by mouth daily.    [provider]  cetirizine (ZYRTEC) 10 MG tablet Take 10 mg by mouth daily with breakfast.     [provider]  clobetasol cream (TEMOVATE) [id number] % Apply 1 application topically 2 (two) times daily as needed (for psoriasis).    [provider]  diltiazem (CARDIZEM CD) 180 MG 24 hr capsule Take 1 capsule by mouth once daily 05/31/19   Leonie Man, MD  ferrous sulfate 325 (65 FE) MG EC tablet Take 325 mg by mouth See admin instructions. Take one tablet (325 mg) by mouth  DAILY STARTING 08/07/18    [provider]  Flurandrenolide (CORDRAN) 4 MCG/SQCM TAPE Apply 1 each topically See admin instructions. Wrap sores caused by psoriasis as needed until wounds heal.    [provider]  gabapentin (NEURONTIN) 300 MG capsule Take 300-600 mg by mouth See admin instructions. Take one capsule (300 mg) by mouth every morning, take two capsules (600  mg) at night    [provider]  HYDROmorphone (DILAUDID) 2 MG tablet Take 0.5 tablets (1 mg total) by mouth every 4 (four) hours as needed for severe pain. 09/07/19   Tacy Learn, PA-C  metFORMIN (GLUCOPHAGE) 500 MG tablet Take 500 mg by mouth 2 (two) times daily with a  meal.     [provider]  nitroGLYCERIN (NITROSTAT) 0.4 MG SL tablet Place 1 tablet (0.4 mg total) under the tongue every 5 (five) minutes as needed for chest pain. 06/29/16   Bhagat, Bhavinkumar, PA  ondansetron (ZOFRAN ODT) 4 MG disintegrating tablet Take 1 tablet (4 mg total) by mouth every 8 (eight) hours as needed for nausea or vomiting. 09/07/19   Tacy Learn, PA-C  pantoprazole (PROTONIX) 40 MG tablet TAKE 1 TABLET BY MOUTH ONCE DAILY Patient taking differently: Take 40 mg by mouth daily before breakfast.  02/17/18   Leonie Man, MD  Polyethyl Glycol-Propyl Glycol (SYSTANE OP) Place 1 drop into both eyes daily.    [provider]  Polyethyl Glycol-Propyl Glycol (SYSTANE) 0.4-0.3 % GEL ophthalmic gel Place 1 application into both eyes at bedtime.     [provider]  predniSONE (DELTASONE) 5 MG tablet Take 1 tablet (5 mg) daily in the morning 01/01/19   Lauraine Rinne, NP  rosuvastatin (CRESTOR) 20 MG tablet Take 1 tablet (20 mg total) by mouth daily. 03/29/19 06/27/19  Ledora Bottcher, PA  zolpidem (AMBIEN) 10 MG tablet Take 10 mg by mouth at bedtime as needed for sleep.    [provider]    Allergies    Hydrocodone, Other, Oxycodone, Pseudoephedrine, Tramadol, and Gemfibrozil  Review of Systems   Review of Systems  Constitutional: Negative for fever.  Respiratory: Negative for shortness of breath.   Cardiovascular: Negative for chest pain.  Gastrointestinal: Positive for constipation and nausea. Negative for abdominal pain, diarrhea and vomiting.  Genitourinary: Positive for hematuria. Negative for decreased urine volume and dysuria.  Musculoskeletal: Positive  for back pain.  Skin: Negative for rash and wound.  Neurological: Negative for weakness.  Hematological: Does not bruise/bleed easily.  All other systems reviewed and are negative.   Physical Exam Updated Vital Signs BP (!) 170/82    Pulse 73    Temp 98.2 F (36.8 C) (Oral)    Resp 18    Ht 6\' 3"  (1.905 m)    Wt 99.8 kg    SpO2 99%    BMI 27.50 kg/m   Physical Exam Vitals and nursing note reviewed.  Constitutional:      General: He is not in acute distress.    Appearance: He is well-developed. He is not diaphoretic.  HENT:     Head: Normocephalic and atraumatic.  Cardiovascular:     Rate and Rhythm: Normal rate and regular rhythm.     Pulses: Normal pulses.     Heart sounds: Murmur present.  Pulmonary:     Effort: Pulmonary effort is normal.     Breath sounds: Normal breath sounds.  Abdominal:     Palpations: Abdomen is soft.     Tenderness: There is no abdominal tenderness. There is no right CVA tenderness or left CVA tenderness.  Musculoskeletal:     Right lower leg: No edema.     Left lower leg: No edema.  Skin:    General: Skin is warm and dry.     Findings: No erythema or rash.  Neurological:     Mental Status: He is alert and oriented to person, place, and time.  Psychiatric:        Behavior: Behavior normal.     ED Results / Procedures / Treatments   Labs (all labs ordered are listed, but only abnormal results are displayed) Labs Reviewed  URINALYSIS, ROUTINE W REFLEX MICROSCOPIC - Abnormal; Notable for the  following components:      Result Value   APPearance CLOUDY (*)    pH 9.0 (*)    Hgb urine dipstick LARGE (*)    Ketones, ur 20 (*)    Protein, ur 30 (*)    RBC / HPF >50 (*)    Bacteria, UA RARE (*)    All other components within normal limits  CBC - Abnormal; Notable for the following components:   Hemoglobin 12.8 (*)    All other components within normal limits  BASIC METABOLIC PANEL - Abnormal; Notable for the following components:   Glucose,  Bld 121 (*)    Calcium 10.6 (*)    All other components within normal limits    EKG None  Radiology CT Renal Stone Study  Result Date: 09/07/2019 CLINICAL DATA:  Bilateral flank and back pain with severe nausea. History of nephrolithiasis. EXAM: CT ABDOMEN AND PELVIS WITHOUT CONTRAST TECHNIQUE: Multidetector CT imaging of the abdomen and pelvis was performed following the standard protocol without IV contrast. COMPARISON:  04/16/2019 CT abdomen/pelvis. FINDINGS: Lower chest: Nonspecific Eric subpleural reticulation and ground-glass opacity at both lung bases, unchanged. No acute abnormality at the lung bases. Coronary atherosclerosis. Hepatobiliary: Normal liver size. No liver mass. Normal gallbladder with no radiopaque cholelithiasis. No biliary ductal dilatation. Pancreas: Normal, with no mass or duct dilation. Spleen: Normal size. No mass. Adrenals/Urinary Tract: Normal adrenals. Clustered 4 mm and 3 mm mid right ureteral stones in the vicinity of the right external iliac artery with moderate right hydroureteronephrosis. Several nonobstructing stones in right greater than left kidneys, largest 11 mm in the upper right kidney and 5 mm in the lower left kidney. No left hydronephrosis. Normal caliber left ureter. No additional ureteral stones. No contour deforming renal masses. Normal nondistended bladder. Stomach/Bowel: Normal non-distended stomach. Normal caliber small bowel with no small bowel wall thickening. Appendectomy. Moderate sigmoid diverticulosis with no large bowel wall thickening or significant pericolonic fat stranding. Vascular/Lymphatic: Atherosclerotic abdominal aorta with ectatic 2.7 cm infrarenal abdominal aorta. No pathologically enlarged lymph nodes in the abdomen or pelvis. Reproductive: Top-normal size prostate with nonspecific internal prostatic calcifications Other: No pneumoperitoneum, ascites or focal fluid collection. Tiny fat containing umbilical hernia. Musculoskeletal: No  aggressive appearing focal osseous lesions. Moderate thoracolumbar spondylosis. IMPRESSION: 1. Clustered obstructing 4 mm and 3 mm mid right ureteral stones in the vicinity of the right external iliac artery with moderate right hydroureteronephrosis. 2. Nonobstructing bilateral nephrolithiasis. 3. Moderate sigmoid diverticulosis. 4. Infrarenal 2.7 cm ectatic abdominal aorta, at risk for aneurysm development. Recommend follow-up aortic ultrasound in 5 years. This recommendation follows ACR consensus guidelines: White Paper of the ACR Incidental Findings Committee II on Vascular Findings. J Am Coll Radiol 2013JB:6262728. 5. Aortic Atherosclerosis (ICD10-I70.0). Electronically Signed   By: Ilona Sorrel M.D.   On: 09/07/2019 20:33    Procedures Procedures (including critical care time)  Medications Ordered in ED Medications  ondansetron (ZOFRAN-ODT) disintegrating tablet 4 mg (4 mg Oral Given 09/07/19 1745)  fentaNYL (SUBLIMAZE) injection 50 mcg (50 mcg Intravenous Given 09/07/19 1808)    ED Course  I have reviewed the triage vital signs and the nursing notes.  Pertinent labs & imaging results that were available during my care of the patient were reviewed by me and considered in my medical decision making (see chart for details).  Clinical Course as of Sep 07 2250  Tue Sep 06, 4420  5845 74 year old male with complaint of flank pain and hematuria with history of kidney stones.  On exam, abdomen is soft and nontender, no CVA tenderness.  Patient is well-appearing.  Pain controlled with fentanyl given in triage. Review of labs, has large amount of hemoglobin in his urine with ketones and protein, no evidence of urinary tract infection.  BMP with normal renal function with creatinine of 1.97.  Hemoglobin without significant findings.  CT without contrast shows cluster of right ureteral stones with hydro-.  Discussed with patient, pain is controlled at this time, not vomiting no indication of infection.   Plan is to discharge with prescription for Dilaudid (patient is allergic to hydrocodone and oxycodone, has tolerated Dilaudid in the past), advised not to drive repeat machinery while taking Dilaudid, discussed fall precautions.  Also given prescription for Zofran for nausea and vomiting.  Patient has Flomax and will continue on with this.  Patient will contact his urologist in the morning for follow-up, given return to ER precautions including fever, pain or vomiting not controlled with medications or other concerns.   [LM]  2251 Discussed incidental finding of infrarenal 2.7 cm ectatic abdominal aorta at risk for aneurysm development with recommendation for ultrasound in 5 years.   [LM]    Clinical Course User Index [LM] Roque Lias   MDM Rules/Calculators/A&P                      Final Clinical Impression(s) / ED Diagnoses Final diagnoses:  Ureterolithiasis    Rx / DC Orders ED Discharge Orders         Ordered    HYDROmorphone (DILAUDID) 2 MG tablet  Every 4 hours PRN     09/07/19 2118    ondansetron (ZOFRAN ODT) 4 MG disintegrating tablet  Every 8 hours PRN     09/07/19 2118           Tacy Learn, PA-C 09/07/19 2252    Quintella Reichert, MD 09/08/19 1118

## 2019-09-07 NOTE — ED Triage Notes (Signed)
Pt states he was diagnosed with multiple kidney stones a few months ago. Bilateral flank and back pain today. Extreme nausea

## 2019-09-08 DIAGNOSIS — N202 Calculus of kidney with calculus of ureter: Secondary | ICD-10-CM | POA: Diagnosis not present

## 2019-09-08 DIAGNOSIS — N201 Calculus of ureter: Secondary | ICD-10-CM | POA: Diagnosis not present

## 2019-09-15 DIAGNOSIS — N202 Calculus of kidney with calculus of ureter: Secondary | ICD-10-CM | POA: Diagnosis not present

## 2019-09-15 DIAGNOSIS — N132 Hydronephrosis with renal and ureteral calculous obstruction: Secondary | ICD-10-CM | POA: Diagnosis not present

## 2019-09-15 DIAGNOSIS — N201 Calculus of ureter: Secondary | ICD-10-CM | POA: Diagnosis not present

## 2019-09-16 ENCOUNTER — Other Ambulatory Visit: Payer: Self-pay | Admitting: Cardiology

## 2019-09-16 MED ORDER — DILTIAZEM HCL ER COATED BEADS 180 MG PO CP24: 180.0000 mg | ORAL_CAPSULE | Freq: Every day | ORAL | 0 refills | Status: DC

## 2019-09-16 NOTE — Telephone Encounter (Signed)
°*  STAT* If patient is at the pharmacy, call can be transferred to refill team.   1. Which medications need to be refilled? (please list name of each medication and dose if known) diltiazem (CARDIZEM CD) 180 MG 24 hr capsule  2. Which pharmacy/location (including street and city if local pharmacy) is medication to be sent to?  [place], [place]  3. Do they need a 30 day or 90 day supply? 90 with refills   Pt is out of medication Pt does not have an appt until 12/22/19 but will not have enough medication to last him until that appt

## 2019-09-21 ENCOUNTER — Encounter: Payer: Self-pay | Admitting: Pulmonary Disease

## 2019-09-21 ENCOUNTER — Other Ambulatory Visit: Payer: Self-pay

## 2019-09-21 ENCOUNTER — Ambulatory Visit (INDEPENDENT_AMBULATORY_CARE_PROVIDER_SITE_OTHER): Payer: Medicare Other | Admitting: Pulmonary Disease

## 2019-09-21 DIAGNOSIS — J849 Interstitial pulmonary disease, unspecified: Secondary | ICD-10-CM

## 2019-09-21 DIAGNOSIS — H35342 Macular cyst, hole, or pseudohole, left eye: Secondary | ICD-10-CM | POA: Diagnosis not present

## 2019-09-21 DIAGNOSIS — H35373 Puckering of macula, bilateral: Secondary | ICD-10-CM | POA: Diagnosis not present

## 2019-09-21 NOTE — Addendum Note (Signed)
Addended by: Edythe Clarity on: 09/21/2019 09:58 AM   Modules accepted: Orders

## 2019-09-21 NOTE — Patient Instructions (Signed)
Ambulatory saturation.  Schedule high-resolution CT chest without contrast If improved or same, we will consider decreasing dose of prednisone

## 2019-09-21 NOTE — Assessment & Plan Note (Signed)
Seems to be a stable interval for Jacobe.  Unfortunately we cannot quantitate lung function by PFTs, but subjectively he feels good and weight loss is also helped We will reassess degree of ILD by high-res CT If this is the same or improved, consider decreasing prednisone to 5 mg every other day  Once again we discussed side effects of long-term prednisone but he is on such a low-dose that I feel okay with this plan.  He has always preferred prednisone to immunosuppressants based on prior discussions

## 2019-09-21 NOTE — Progress Notes (Signed)
° °  Subjective:    Patient ID: Kenneth [name], male    DOB: [birth year], 74 y.o.   MRN: [id number]  HPI   Kenneth [name] is a 74 yo man with  steroid responsive ILD and being favored NSIP.  There is a history of being on methotrexate in the remote past. He has responded well to steroids and has been maintained on low-dose about 5 mg of prednisone.  We have discussed immunosuppressants in the past and he has preferred not to go on this  PMH - CAD, atrial fibrillation for 15 years s/p atrial ablation 01/2014 . He takes flecainide if his heart rate gets too high.  For psoriatic arthritis, he was on Enbrel and methotrexate weekly for 3 years , was then on Humira.  Now on adalimumab  1 year follow-up, last seen 12/2018 Denies cough, wheezing or worsening shortness of breath Remains on 5 mg of prednisone  Wife went through chemotherapy for uterine cancer and is now wheelchair-bound, he takes care of her, stays active  Has lost 25 pounds in the last 2 years    Significant tests/ events reviewed  CT angiogram 09/2007 which shows bilateral groundglass opacities but a repeat CT in 10/2007 and shows resolution of these infiltrates.  01/27/2014 HRCT - patchy areas of very mild ground-glass attenuation and subpleural reticulation. CHest HRCT 04/2016 >> slight worse , NSIP pattern HRCT 05/2017 unchanged NSIP, 2-4 mm new  benign nodules   PFT 04/2015 >> ratio nml, FVC 61%, TLC 68%, DLCO 67%    11/15/2013-echocardiogram-LV ejection fraction 65 to 70%, mild concentric hypertrophy, PAP pressure 35  Review of Systems neg for any significant sore throat, dysphagia, itching, sneezing, nasal congestion or excess/ purulent secretions, fever, chills, sweats, unintended wt loss, pleuritic or exertional cp, hempoptysis, orthopnea pnd or change in chronic leg swelling. Also denies presyncope, palpitations, heartburn, abdominal pain, nausea, vomiting, diarrhea or change in bowel or urinary habits, dysuria,hematuria,  rash, arthralgias, visual complaints, headache, numbness weakness or ataxia.     Objective:   Physical Exam  Gen. Pleasant, obese, in no distress ENT - no lesions, no post nasal drip Neck: No JVD, no thyromegaly, no carotid bruits Lungs: no use of accessory muscles, no dullness to percussion, LT basal rales no rhonchi  Cardiovascular: Rhythm regular, heart sounds  normal, no murmurs or gallops, no peripheral edema Musculoskeletal: No deformities, no cyanosis or clubbing , no tremors       Assessment & Plan:

## 2019-09-22 ENCOUNTER — Other Ambulatory Visit: Payer: Self-pay | Admitting: Pulmonary Disease

## 2019-09-27 ENCOUNTER — Other Ambulatory Visit: Payer: Self-pay | Admitting: Urology

## 2019-10-04 ENCOUNTER — Encounter (HOSPITAL_BASED_OUTPATIENT_CLINIC_OR_DEPARTMENT_OTHER): Payer: Self-pay | Admitting: Urology

## 2019-10-04 ENCOUNTER — Other Ambulatory Visit: Payer: Self-pay

## 2019-10-04 NOTE — Anesthesia Preprocedure Evaluation (Addendum)
Anesthesia Evaluation  Patient identified by MRN, date of birth, ID band Patient awake    Reviewed: Allergy & Precautions, NPO status , Patient's Chart, lab work & pertinent test results  Airway Mallampati: II  TM Distance: >3 FB Neck ROM: Full    Dental no notable dental hx. (+) Teeth Intact, Dental Advisory Given   Pulmonary sleep apnea and Continuous Positive Airway Pressure Ventilation , former smoker,    Pulmonary exam normal breath sounds clear to auscultation       Cardiovascular hypertension, Pt. on medications + angina + CAD and +CHF  Normal cardiovascular exam Rhythm:Regular Rate:Normal  10/2016 Lexiscan  The left ventricular ejection fraction is normal (55-65%).  Nuclear stress EF: 57%. No wall motion abnormalities.  There was no ST segment deviation noted during stress.  The study is normal. No ischemia.  This is a low risk study.    Neuro/Psych negative neurological ROS  negative psych ROS   GI/Hepatic GERD  ,  Endo/Other  diabetes, Type 2  Renal/GU Renal disease     Musculoskeletal  (+) Arthritis ,   Abdominal   Peds  Hematology  (+) anemia ,   Anesthesia Other Findings   Reproductive/Obstetrics                          Anesthesia Physical Anesthesia Plan  ASA: III  Anesthesia Plan: General   Post-op Pain Management:    Induction: Intravenous  PONV Risk Score and Plan: 3 and Treatment may vary due to age or medical condition, Ondansetron and Dexamethasone  Airway Management Planned: LMA  Additional Equipment: None  Intra-op Plan:   Post-operative Plan:   Informed Consent: I have reviewed the patients History and Physical, chart, labs and discussed the procedure including the risks, benefits and alternatives for the proposed anesthesia with the patient or authorized representative who has indicated his/her understanding and acceptance.     Dental  advisory given  Plan Discussed with:   Anesthesia Plan Comments: (See APP note by Durel Salts, FNP)      Anesthesia Quick Evaluation

## 2019-10-04 NOTE — Progress Notes (Addendum)
ADDENDUM:  Chart reviewed by anesthesia, Azzie Glatter NP,  Refer to her progress note.   Spoke w/ via phone for pre-op interview--- Pt and Pt's wife, Judeen Hammans.  Pt is HOH over the phone Lab needs dos---- Istat              Lab results------ current ekg in epic/ chart COVID test ------ 10-05-2019 @ 1015 Arrive at ------- 0800 NPO after ------ MN Medications to take morning of surgery ----- Protonix, Prednisone, Gabapentin w/ sips of water Diabetic medication ----- do not take metformin morning of surgery Patient Special Instructions ----- n/a Pre-Op special Istructions ----- n/a Patient verbalized understanding of instructions that were given at this phone interview. Patient denies shortness of breath, chest pain, fever, cough a this phone interview.   Anesthesia Review:  Hx HTN, Chronic diastolic CHF, DM2, CAD s/p PCI and stent's in 2018. ILD;   PAF s/p ablation 2006;  Plaque psoriasis.  Pt denies any cardiac s&s.   Chart to be reviewed by anesthesia.  PCP:  Dr Landry Mellow @ [facility] in [place] Cardiologist :  Dr Ellyn Hack [facility] 12-29-2018 epic) Pulmonology: Dr Elsworth Soho (lov 09-21-2019 epic)  Chest x-ray :  06-05-2018 epic (pt is scheduled for Chest CT 10-07-2019) EKG :  12-21-2018 epic Echo :  11-15-2013 epic Stress test:  Nuclear 10-22-2016 epic Cardiac Cath :  07-10-2016 epic  Activity level:  Pt stated is able to daily activities without sob but does get DOE up a flight of stairs  Sleep Study/ CPAP : YES/  Per pt study was yrs ago and was told no cpap recommended Fasting Blood Sugar :      / Checks Blood Sugar -- times a day:  Pt does not check   Blood Thinner/ Instructions /Last Dose: NO ASA / Instructions/ Last Dose : ASA 81mg /  Given instructions to continue ASA does not need to stop by Dr Alyson Ingles

## 2019-10-04 NOTE — Progress Notes (Signed)
Anesthesia Chart Review:  Pt is a same day work up   Case: [id number] Date/Time: 10/08/19 0945   Procedures:      CYSTOSCOPY WITH RETROGRADE PYELOGRAM, URETEROSCOPY AND STENT PLACEMENT (Right ) - 1 HR     HOLMIUM LASER APPLICATION (Right )   Anesthesia type: General   Pre-op diagnosis: RIGHT URETERAL CALCULI   Location: [facility] OR ROOM 2 / [place]   Surgeons: Cleon Gustin, MD      DISCUSSION:  Pt is a 74 year old with hx CAD (DES to RCA, LAD 2018), CHF, PAF (s/p ablation 2006, 2015), DM, ILD   PROVIDERS: - Primary care at [facility], [place] is [name], MD. Last office visit 12/21/18 - Pulmonologist is Kara Mead, MD. Last office visit 09/21/19   LABS: Will be obtained day of surgery   IMAGES: CT high res 05/26/17:  1. The appearance of the lungs is very similar to prior examinations, again most compatible with nonspecific interstitial pneumonia (NSIP). 2. Several new small 2-4 mm pulmonary nodules throughout the lungs bilaterally, most of which are in the right upper lobe. These are nonspecific, but are strongly favored to be benign. No follow-up needed if patient is low-risk (and has no known or suspected primary neoplasm). Non-contrast chest CT can be considered in 12 months if patient is high-risk.  3. Dilatation of the pulmonic trunk (3.7 cm in diameter), concerning for pulmonary arterial hypertension. 4. Aortic atherosclerosis, in addition to left main and 3 vessel coronary artery disease. Assessment for potential risk factor modification, dietary therapy or pharmacologic therapy may be warranted, if clinically indicated. 5. There are calcifications of the aortic valve and mitral annulus. Echocardiographic correlation for evaluation of potential valvular dysfunction may be warranted if clinically indicated. 6. Mild hepatic steatosis.   EKG 12/21/18: NSR with sinus arrhythmia   CV:  Cardiac event monitor 01/02/18:  -  Predominant rhythm is sinus rhythm - Occasional premature atrial contractions and rare premature ventricular contractions - Frequent episodes of nonsustained atrial tachycardia are noted - No sustained arrhythmias   Nuclear stress test 10/22/16:   The left ventricular ejection fraction is normal (55-65%).  Nuclear stress EF: 57%. No wall motion abnormalities.  There was no ST segment deviation noted during stress.  The study is normal. No ischemia.  This is a low risk study.   Coronary stent intervention 07/10/16:    Mid LAD (just distal to SP1) lesion, 65-70 %stenosed. FFR 0.75  A STENT SYNERGY DES 3.5X20 drug eluting stent was successfully placed.  Post intervention, there is a 0% residual stenosis.  3rd Mrg lesion, 55 %stenosed. - Successful PCI of LAD lesion.  R/L Cardiac cath 06/28/16:   Mid RCA lesion, 99 %stenosed.  A STENT SYNERGY DES 2.75X20 drug eluting stent was successfully placed.  Post intervention, there is a 0% residual stenosis.  Mid LAD lesion, 65 %stenosed. FFR performed => final FFR 0.75 (physiologically significant). Plan staged PCI  3rd Mrg lesion, 55 %stenosed.  Hemodynamic findings consistent with mild pulmonary hypertension. LV end diastolic pressure is moderately elevated.  The left ventricular systolic function is normal.  The left ventricular ejection fraction is 55-65% by visual estimate.   Mild secondary pulmonary hypertension  Severe 2 Vessel disease - 95% mRCA s/p PCI with DES; ~70% prox LAD with FFR 0.75  Codominant Cx with distal ~60% lesion prior to bifurcation into distal LPL branches    Past Medical History:  Diagnosis Date  . Arthritis   .  CAD S/P percutaneous coronary angioplasty cardiologist-  dr Ellyn Hack    06/28/16 --PCI with Synergy DES 2.75 mm 20 mm-->m RCA; 07/10/16 PCI with Synergy DES 3.5 mm x 20 mm-->mLAD  . Chronic diastolic heart failure ([place])    followed by dr harding  . Chronic dryness of both eyes   .  Diverticulosis of colon   . Dyspnea on exertion   . Epiretinal membrane    "epiretinal attachment" (06/28/2016) 10-14-2017 per pt oringinally in both , now only unilateral   . GERD (gastroesophageal reflux disease)   . Heart murmur   . Hiatal hernia   . History of adenomatous polyp of colon   . History of concussion 08/23/2017   w/o loc--- per pt no residual  . History of kidney stones   . History of rheumatic fever 1958  . History of skin cancer    excision leg;  froze the left arm--- unsure BCC or SCC   . Hyperlipidemia   . Hypertension   . ILD (interstitial lung disease) ([place])    pulmologist-  dr Elsworth Soho--  secondary to methotrexate use --- last PFTs 04-25-2015  mild restriction  . Iron deficiency anemia   . Mild obstructive sleep apnea    10-04-2019 per pt  had a sleep study years ago , was told no cpap recommended  . Nephrolithiasis    CT 09-07-2019 bilateral nonobstructive stones  . Paroxysmal atrial fibrillation [facility]) cardiologist-  dr harding/  EP-- dr allred   a. 11/2003 Tikosyn initiated - subsequently d/c'd;  b. 2006 [place] for Afib @ [facility];  c. 09/2011 Echo: EF 60-65%, Gr 2 DD;  d. DCCV 10/2011 , 04/2012, 12/ 2014, & 02/ 2015;  e. RFCA 01-13-2014  . Plaque psoriasis 1969   followed by rheumatologist  and dermatologist @ [facility] in [place]  . Right ureteral stone   . S/P drug eluting coronary stent placement    06-28-2016  x1 to mRCA;  07-10-2016 x1 to mLAD  . Seasonal allergies   . Type 2 diabetes mellitus ([place])    followed by pcp  . Ureteral calculus, right   . Wears hearing aid in both ears     Past Surgical History:  Procedure Laterality Date  . APPENDECTOMY  1996  . ATRIAL FIBRILLATION ABLATION N/A 01/13/2014   repeat PVI, also left atrial ablation performed with successfull ablation of LA flutter by Dr Rayann Heman  . ATRIAL FIBRILLATION ABLATION  08/2004   Dr Rolland Porter at [facility] [place])  . CARDIAC CATHETERIZATION  06-30-2003  dr Lia Foyer   no significant coronary  obstruction, well preserved LVF, recurrent afib  . CARDIOVERSION N/A 05/22/2013   Procedure: CARDIOVERSION;  Surgeon: [name], MD;  Location: [facility];  Service: Cardiovascular;  Laterality: N/A;  . CARDIOVERSION N/A 10/23/2011   Procedure: CARDIOVERSION;  Surgeon: Deboraha Sprang, MD;  Location: [facility] CATH LAB;  Service: Cardiovascular;  Laterality: N/A;  . CARDIOVERSION N/A 05/01/2012   Procedure: CARDIOVERSION;  Surgeon: Deboraha Sprang, MD;  Location: [facility] CATH LAB;  Service: Cardiovascular;  Laterality: N/A;  . CARPAL TUNNEL RELEASE Right 1980s  . CATARACT EXTRACTION W/ INTRAOCULAR LENS  IMPLANT, BILATERAL  2012  approx.  . CORONARY STENT INTERVENTION N/A 06/28/2016   Procedure: Coronary Stent Intervention;  Surgeon: Leonie Man, MD;  Location: [place] CV LAB;  Service: Cardiovascular;  Laterality: mRCA PCI -Synergy DES 2.75 m x 20 mm (3.1 mm)  . CORONARY STENT INTERVENTION N/A 07/10/2016   Procedure: Coronary Stent Intervention;  Surgeon: Leonie Man, MD;  Location: [facility] CV LAB;  Service: Cardiovascular: mLAD PCI Synergy DES 3.5 mm x 20 mm)  . CYSTOSCOPY WITH RETROGRADE PYELOGRAM, URETEROSCOPY AND STENT PLACEMENT Right 10/16/2017   Procedure: CYSTOSCOPY WITH RIGHT RETROGRADE RIGHT URETEROSCOPY AND STONE BASKET EXTRACTION;  Surgeon: Irine Seal, MD;  Location: [facility];  Service: Urology;  Laterality: Right;  . CYSTOSCOPY/RETROGRADE/URETEROSCOPY/STONE EXTRACTION WITH BASKET Right 10/27/2017   Procedure: CYSTOSCOPY/RETROGRADE/URETEROSCOPY/STONE EXTRACTION WITH BASKET/URETERAL STENT PLACEMENT and laser;  Surgeon: Irine Seal, MD;  Location: [facility] ORS;  Service: Urology;  Laterality: Right;  . HOLMIUM LASER APPLICATION Right 9/74/1638   Procedure: POSSIBLE HOLMIUM LASER APPLICATION;  Surgeon: Irine Seal, MD;  Location: [facility] ORS;  Service: Urology;  Laterality: Right;  . INTRAVASCULAR PRESSURE WIRE/FFR STUDY N/A 06/28/2016   Procedure: Intravascular Pressure Wire/FFR Study;   Surgeon: Leonie Man, MD;  Location: [facility] CV LAB;  Service: Cardiovascular: FFR of mLAD ~65% lesion = 0.75 post --> Staged PCI  . NASAL SINUS SURGERY     x 2  . PROXIMAL INTERPHALANGEAL FUSION (PIP) Left 01-05-2001    dr graves   correction clawtoe and extensor tendon lengthening  . RIGHT/LEFT HEART CATH AND CORONARY ANGIOGRAPHY N/A 06/28/2016   Procedure: Right/Left Heart Cath and Coronary Angiography;  Surgeon: Leonie Man, MD;  Location: [place] CV LAB;  Service: Cardiovascular: mRCA 99% (TIMI 2), mLAD ~65% (FFR 0.75), OM3 55%. mild Pulm HTN. Mod LVEDP elevation. EF 55-65%.  Marland Kitchen ROTATOR CUFF REPAIR Bilateral last one 2014  . TONSILLECTOMY  age 63  . URETEROSCOPIC LASER LITHOTRIPSY STONE EXTRACTIONS/  STENT PLACEMENT Bilateral 09-10-2007   dr Jeffie Pollock  [facility]   per pt has had several prior ureteroscopic stone extraction since age 6 , last one 09-10-2007    MEDICATIONS: No current facility-administered medications for this encounter.   Marland Kitchen acetaminophen (TYLENOL) 500 MG tablet  . Adalimumab (HUMIRA) 40 MG/0.4ML PSKT  . aspirin EC 81 MG tablet  . Biotin 1000 MCG tablet  . calcipotriene (DOVONOX) 0.005 % cream  . Calcium Carb-Cholecalciferol (CALCIUM+D3) 600-800 MG-UNIT TABS  . cetirizine (ZYRTEC) 10 MG tablet  . clobetasol cream (TEMOVATE) 0.05 %  . diltiazem (CARDIZEM CD) 180 MG 24 hr capsule  . ferrous sulfate 325 (65 FE) MG tablet  . gabapentin (NEURONTIN) 300 MG capsule  . metFORMIN (GLUCOPHAGE) 500 MG tablet  . Misc Natural Products (NEURIVA) CAPS  . pantoprazole (PROTONIX) 40 MG tablet  . Polyethyl Glycol-Propyl Glycol (SYSTANE OP)  . Polyethyl Glycol-Propyl Glycol (SYSTANE) 0.4-0.3 % GEL ophthalmic gel  . predniSONE (DELTASONE) 5 MG tablet  . Probiotic Product (PROBIOTIC DAILY) CAPS  . rosuvastatin (CRESTOR) 20 MG tablet  . tamsulosin (FLOMAX) 0.4 MG CAPS capsule  . Flurandrenolide (CORDRAN) 4 MCG/SQCM TAPE   . lidocaine (cardiac) 100 mg/71ml (XYLOCAINE) 20 MG/ML  injection 2%  . propofol (DIPRIVAN) 10 mg/mL bolus/IV push    If labs acceptable day of surgery, I anticipate pt can proceed with surgery as scheduled.  Willeen Cass, FNP-BC [facility]/Anesthesiology Phone: [phone] 10/04/2019 4:20 PM

## 2019-10-05 ENCOUNTER — Other Ambulatory Visit (HOSPITAL_COMMUNITY)
Admission: RE | Admit: 2019-10-05 | Discharge: 2019-10-05 | Disposition: A | Discharge status: Home / Self Care | Payer: Medicare Other | Source: Ambulatory Visit | Attending: Urology | Admitting: Urology

## 2019-10-05 DIAGNOSIS — Z20822 Contact with and (suspected) exposure to covid-19: Secondary | ICD-10-CM | POA: Diagnosis not present

## 2019-10-05 DIAGNOSIS — Z01812 Encounter for preprocedural laboratory examination: Secondary | ICD-10-CM | POA: Diagnosis present

## 2019-10-05 LAB — SARS CORONAVIRUS 2 (TAT 6-24 HRS): SARS Coronavirus 2: NEGATIVE

## 2019-10-07 ENCOUNTER — Other Ambulatory Visit: Payer: Self-pay

## 2019-10-07 ENCOUNTER — Ambulatory Visit
Admission: RE | Admit: 2019-10-07 | Discharge: 2019-10-07 | Disposition: A | Discharge status: Home / Self Care | Payer: Medicare Other | Source: Ambulatory Visit | Attending: Pulmonary Disease | Admitting: Pulmonary Disease

## 2019-10-07 DIAGNOSIS — I358 Other nonrheumatic aortic valve disorders: Secondary | ICD-10-CM | POA: Diagnosis not present

## 2019-10-07 DIAGNOSIS — I700 Atherosclerosis of aorta: Secondary | ICD-10-CM | POA: Diagnosis not present

## 2019-10-07 DIAGNOSIS — I2510 Atherosclerotic heart disease of native coronary artery without angina pectoris: Secondary | ICD-10-CM | POA: Diagnosis not present

## 2019-10-07 DIAGNOSIS — J849 Interstitial pulmonary disease, unspecified: Secondary | ICD-10-CM

## 2019-10-07 DIAGNOSIS — R918 Other nonspecific abnormal finding of lung field: Secondary | ICD-10-CM | POA: Diagnosis not present

## 2019-10-08 ENCOUNTER — Other Ambulatory Visit: Payer: Self-pay

## 2019-10-08 ENCOUNTER — Ambulatory Visit (HOSPITAL_BASED_OUTPATIENT_CLINIC_OR_DEPARTMENT_OTHER)
Admission: RE | Admit: 2019-10-08 | Discharge: 2019-10-08 | Disposition: A | Discharge status: Home / Self Care | Payer: Medicare Other | Attending: Urology | Admitting: Urology

## 2019-10-08 ENCOUNTER — Encounter (HOSPITAL_BASED_OUTPATIENT_CLINIC_OR_DEPARTMENT_OTHER): Payer: Self-pay | Admitting: Urology

## 2019-10-08 ENCOUNTER — Ambulatory Visit (HOSPITAL_BASED_OUTPATIENT_CLINIC_OR_DEPARTMENT_OTHER): Payer: Medicare Other | Admitting: Emergency Medicine

## 2019-10-08 ENCOUNTER — Encounter (HOSPITAL_BASED_OUTPATIENT_CLINIC_OR_DEPARTMENT_OTHER)
Admission: RE | Disposition: A | Discharge status: Home / Self Care | Payer: Self-pay | Source: Home / Self Care | Attending: Urology

## 2019-10-08 DIAGNOSIS — Z87891 Personal history of nicotine dependence: Secondary | ICD-10-CM | POA: Insufficient documentation

## 2019-10-08 DIAGNOSIS — G4733 Obstructive sleep apnea (adult) (pediatric): Secondary | ICD-10-CM | POA: Diagnosis not present

## 2019-10-08 DIAGNOSIS — E119 Type 2 diabetes mellitus without complications: Secondary | ICD-10-CM | POA: Insufficient documentation

## 2019-10-08 DIAGNOSIS — N201 Calculus of ureter: Secondary | ICD-10-CM | POA: Diagnosis present

## 2019-10-08 DIAGNOSIS — Z85828 Personal history of other malignant neoplasm of skin: Secondary | ICD-10-CM | POA: Diagnosis not present

## 2019-10-08 DIAGNOSIS — Z7984 Long term (current) use of oral hypoglycemic drugs: Secondary | ICD-10-CM | POA: Diagnosis not present

## 2019-10-08 DIAGNOSIS — M1990 Unspecified osteoarthritis, unspecified site: Secondary | ICD-10-CM | POA: Insufficient documentation

## 2019-10-08 DIAGNOSIS — Z7982 Long term (current) use of aspirin: Secondary | ICD-10-CM | POA: Insufficient documentation

## 2019-10-08 DIAGNOSIS — E785 Hyperlipidemia, unspecified: Secondary | ICD-10-CM | POA: Diagnosis not present

## 2019-10-08 DIAGNOSIS — I2510 Atherosclerotic heart disease of native coronary artery without angina pectoris: Secondary | ICD-10-CM | POA: Diagnosis not present

## 2019-10-08 DIAGNOSIS — I5032 Chronic diastolic (congestive) heart failure: Secondary | ICD-10-CM | POA: Insufficient documentation

## 2019-10-08 DIAGNOSIS — I480 Paroxysmal atrial fibrillation: Secondary | ICD-10-CM | POA: Insufficient documentation

## 2019-10-08 DIAGNOSIS — Z7952 Long term (current) use of systemic steroids: Secondary | ICD-10-CM | POA: Diagnosis not present

## 2019-10-08 DIAGNOSIS — K219 Gastro-esophageal reflux disease without esophagitis: Secondary | ICD-10-CM | POA: Diagnosis not present

## 2019-10-08 DIAGNOSIS — Z79899 Other long term (current) drug therapy: Secondary | ICD-10-CM | POA: Diagnosis not present

## 2019-10-08 DIAGNOSIS — I110 Hypertensive heart disease with heart failure: Secondary | ICD-10-CM | POA: Insufficient documentation

## 2019-10-08 DIAGNOSIS — Z955 Presence of coronary angioplasty implant and graft: Secondary | ICD-10-CM | POA: Insufficient documentation

## 2019-10-08 HISTORY — DX: Chronic diastolic (congestive) heart failure: I50.32

## 2019-10-08 HISTORY — DX: Dry eye syndrome of bilateral lacrimal glands: H04.123

## 2019-10-08 HISTORY — PX: CYSTOSCOPY WITH RETROGRADE PYELOGRAM, URETEROSCOPY AND STENT PLACEMENT: SHX5789

## 2019-10-08 HISTORY — DX: Obstructive sleep apnea (adult) (pediatric): G47.33

## 2019-10-08 HISTORY — DX: Calculus of kidney: N20.0

## 2019-10-08 HISTORY — DX: Calculus of ureter: N20.1

## 2019-10-08 HISTORY — PX: HOLMIUM LASER APPLICATION: SHX5852

## 2019-10-08 LAB — POCT I-STAT, CHEM 8
BUN: 11 mg/dL (ref 8–23)
Calcium, Ion: 1.23 mmol/L (ref 1.15–1.40)
Chloride: 103 mmol/L (ref 98–111)
Creatinine, Ser: 0.8 mg/dL (ref 0.61–1.24)
Glucose, Bld: 137 mg/dL — ABNORMAL HIGH (ref 70–99)
HCT: 35 % — ABNORMAL LOW (ref 39.0–52.0)
Hemoglobin: 11.9 g/dL — ABNORMAL LOW (ref 13.0–17.0)
Potassium: 3.8 mmol/L (ref 3.5–5.1)
Sodium: 141 mmol/L (ref 135–145)
TCO2: 28 mmol/L (ref 22–32)

## 2019-10-08 LAB — GLUCOSE, CAPILLARY: Glucose-Capillary: 141 mg/dL — ABNORMAL HIGH (ref 70–99)

## 2019-10-08 SURGERY — CYSTOURETEROSCOPY, WITH RETROGRADE PYELOGRAM AND STENT INSERTION
Anesthesia: General | Site: Pelvis | Laterality: Right

## 2019-10-08 MED ORDER — LIDOCAINE 2% (20 MG/ML) 5 ML SYRINGE
INTRAMUSCULAR | Status: AC
  Filled 2019-10-08: qty 5

## 2019-10-08 MED ORDER — ACETAMINOPHEN 10 MG/ML IV SOLN: 1000.0000 mg | Freq: Once | INTRAVENOUS | Status: DC | PRN

## 2019-10-08 MED ORDER — CEFAZOLIN SODIUM-DEXTROSE 2-4 GM/100ML-% IV SOLN
2.0000 g | INTRAVENOUS | Status: AC
  Administered 2019-10-08: 2 g via INTRAVENOUS

## 2019-10-08 MED ORDER — LACTATED RINGERS IV SOLN: INTRAVENOUS | Status: DC

## 2019-10-08 MED ORDER — PROPOFOL 10 MG/ML IV BOLUS
INTRAVENOUS | Status: AC
  Filled 2019-10-08: qty 20

## 2019-10-08 MED ORDER — DEXAMETHASONE SODIUM PHOSPHATE 10 MG/ML IJ SOLN
INTRAMUSCULAR | Status: DC | PRN
  Administered 2019-10-08: 10 mg via INTRAVENOUS

## 2019-10-08 MED ORDER — DEXAMETHASONE SODIUM PHOSPHATE 10 MG/ML IJ SOLN
INTRAMUSCULAR | Status: AC
  Filled 2019-10-08: qty 1

## 2019-10-08 MED ORDER — SODIUM CHLORIDE 0.9 % IR SOLN
Status: DC | PRN
  Administered 2019-10-08: 3000 mL via INTRAVESICAL

## 2019-10-08 MED ORDER — KETOROLAC TROMETHAMINE 30 MG/ML IJ SOLN
INTRAMUSCULAR | Status: DC | PRN
  Administered 2019-10-08: 30 mg via INTRAVENOUS

## 2019-10-08 MED ORDER — ONDANSETRON HCL 4 MG/2ML IJ SOLN: 4.0000 mg | Freq: Once | INTRAMUSCULAR | Status: DC | PRN

## 2019-10-08 MED ORDER — ONDANSETRON HCL 4 MG/2ML IJ SOLN
INTRAMUSCULAR | Status: DC | PRN
  Administered 2019-10-08: 4 mg via INTRAVENOUS

## 2019-10-08 MED ORDER — FENTANYL CITRATE (PF) 100 MCG/2ML IJ SOLN
INTRAMUSCULAR | Status: DC | PRN
  Administered 2019-10-08: 50 ug via INTRAVENOUS
  Administered 2019-10-08 (×2): 25 ug via INTRAVENOUS

## 2019-10-08 MED ORDER — FENTANYL CITRATE (PF) 100 MCG/2ML IJ SOLN
INTRAMUSCULAR | Status: AC
  Filled 2019-10-08: qty 2

## 2019-10-08 MED ORDER — FENTANYL CITRATE (PF) 100 MCG/2ML IJ SOLN: 25.0000 ug | INTRAMUSCULAR | Status: DC | PRN

## 2019-10-08 MED ORDER — LIDOCAINE HCL (CARDIAC) PF 100 MG/5ML IV SOSY
PREFILLED_SYRINGE | INTRAVENOUS | Status: DC | PRN
  Administered 2019-10-08: 100 mg via INTRAVENOUS

## 2019-10-08 MED ORDER — CEFAZOLIN SODIUM-DEXTROSE 2-4 GM/100ML-% IV SOLN
INTRAVENOUS | Status: AC
  Filled 2019-10-08: qty 100

## 2019-10-08 MED ORDER — ONDANSETRON HCL 4 MG/2ML IJ SOLN
INTRAMUSCULAR | Status: AC
  Filled 2019-10-08: qty 2

## 2019-10-08 MED ORDER — IOHEXOL 300 MG/ML  SOLN
INTRAMUSCULAR | Status: DC | PRN
  Administered 2019-10-08: 10 mL

## 2019-10-08 MED ORDER — PROPOFOL 10 MG/ML IV BOLUS
INTRAVENOUS | Status: DC | PRN
  Administered 2019-10-08 (×2): 100 mg via INTRAVENOUS

## 2019-10-08 MED ORDER — HYDROMORPHONE HCL 2 MG PO TABS: 2.0000 mg | ORAL_TABLET | ORAL | 0 refills | Status: AC | PRN

## 2019-10-08 SURGICAL SUPPLY — 28 items
BAG DRAIN URO-CYSTO SKYTR STRL (DRAIN) ×4 IMPLANT
BAG DRN UROCATH (DRAIN) ×2
BASKET STONE 1.7 NGAGE (UROLOGICAL SUPPLIES) ×2 IMPLANT
CATH INTERMIT  6FR 70CM (CATHETERS) IMPLANT
CLOTH BEACON ORANGE TIMEOUT ST (SAFETY) ×4 IMPLANT
DRSG TEGADERM 2-3/8X2-3/4 SM (GAUZE/BANDAGES/DRESSINGS) ×2 IMPLANT
EVACUATOR MICROVAS BLADDER (UROLOGICAL SUPPLIES) IMPLANT
EXTRACTOR STONE 1.7FRX115CM (UROLOGICAL SUPPLIES) IMPLANT
FIBER LASER FLEXIVA 1000 (UROLOGICAL SUPPLIES) IMPLANT
FIBER LASER FLEXIVA 365 (UROLOGICAL SUPPLIES) IMPLANT
FIBER LASER FLEXIVA 550 (UROLOGICAL SUPPLIES) IMPLANT
FIBER LASER TRAC TIP (UROLOGICAL SUPPLIES) ×2 IMPLANT
GLOVE BIO SURGEON STRL SZ8 (GLOVE) ×4 IMPLANT
GOWN STRL REUS W/TWL XL LVL3 (GOWN DISPOSABLE) ×4 IMPLANT
GUIDEWIRE STR DUAL SENSOR (WIRE) IMPLANT
GUIDEWIRE ZIPWRE .038 STRAIGHT (WIRE) ×4 IMPLANT
IV NS 1000ML (IV SOLUTION)
IV NS 1000ML BAXH (IV SOLUTION) ×2 IMPLANT
IV NS IRRIG 3000ML ARTHROMATIC (IV SOLUTION) ×6 IMPLANT
KIT TURNOVER CYSTO (KITS) ×4 IMPLANT
MANIFOLD NEPTUNE II (INSTRUMENTS) ×4 IMPLANT
NS IRRIG 500ML POUR BTL (IV SOLUTION) ×4 IMPLANT
PACK CYSTO (CUSTOM PROCEDURE TRAY) ×4 IMPLANT
STENT URET 6FRX26 CONTOUR (STENTS) ×2 IMPLANT
SYR 10ML LL (SYRINGE) ×4 IMPLANT
TUBE CONNECTING 12'X1/4 (SUCTIONS) ×1
TUBE CONNECTING 12X1/4 (SUCTIONS) ×1 IMPLANT
TUBING UROLOGY SET (TUBING) ×4 IMPLANT

## 2019-10-08 NOTE — Anesthesia Postprocedure Evaluation (Signed)
Anesthesia Post Note  Patient: Kenneth [name]  Procedure(s) Performed: CYSTOSCOPY WITH URETEROSCOPY; RETROGRADE PYELOGRAM; STONE BASKET EXTRACTION;  STENT PLACEMENT (Right Pelvis) HOLMIUM LASER APPLICATION (Right )     Patient location during evaluation: PACU Anesthesia Type: General Level of consciousness: awake and alert Pain management: pain level controlled Vital Signs Assessment: post-procedure vital signs reviewed and stable Respiratory status: spontaneous breathing, nonlabored ventilation, respiratory function stable and patient connected to nasal cannula oxygen Cardiovascular status: blood pressure returned to baseline and stable Postop Assessment: no apparent nausea or vomiting Anesthetic complications: no   No complications documented.  Last Vitals:  Vitals:   10/08/19 1122 10/08/19 1130  BP: (!) 146/77 139/80  Pulse: 80 79  Resp: (!) 27 (!) 21  Temp: 36.5 C   SpO2: 100% 95%    Last Pain:  Vitals:   10/08/19 1122  TempSrc:   PainSc: 0-No pain                 Barnet Glasgow

## 2019-10-08 NOTE — Discharge Instructions (Signed)
Ureteral Stent Implantation, Care After This sheet gives you information about how to care for yourself after your procedure. Your health care provider may also give you more specific instructions. If you have problems or questions, contact your health care provider. What can I expect after the procedure? After the procedure, it is common to have:  Nausea.  Mild pain when you urinate. You may feel this pain in your lower back or lower abdomen. The pain should stop within a few minutes after you urinate. This may last for up to 1 week.  A small amount of blood in your urine for several days. Follow these instructions at home: Medicines  Take over-the-counter and prescription medicines only as told by your health care provider.  If you were prescribed an antibiotic medicine, take it as told by your health care provider. Do not stop taking the antibiotic even if you start to feel better.  Do not drive for 24 hours if you were given a sedative during your procedure.  Ask your health care provider if the medicine prescribed to you requires you to avoid driving or using heavy machinery. Activity  Rest as told by your health care provider.  Avoid sitting for a long time without moving. Get up to take short walks every 1-2 hours. This is important to improve blood flow and breathing. Ask for help if you feel weak or unsteady.  Return to your normal activities as told by your health care provider. Ask your health care provider what activities are safe for you. General instructions   Watch for any blood in your urine. Call your health care provider if the amount of blood in your urine increases.  If you have a catheter: ? Follow instructions from your health care provider about taking care of your catheter and collection bag. ? Do not take baths, swim, or use a hot tub until your health care provider approves. Ask your health care provider if you may take showers. You may only be allowed to  take sponge baths.  Drink enough fluid to keep your urine pale yellow.  Do not use any products that contain nicotine or tobacco, such as cigarettes, e-cigarettes, and chewing tobacco. These can delay healing after surgery. If you need help quitting, ask your health care provider.  Keep all follow-up visits as told by your health care provider. This is important. Contact a health care provider if:  You have pain that gets worse or does not get better with medicine, especially pain when you urinate.  You have difficulty urinating.  You feel nauseous or you vomit repeatedly during a period of more than 2 days after the procedure. Get help right away if:  Your urine is dark red or has blood clots in it.  You are leaking urine (have incontinence).  The end of the stent comes out of your urethra.  You cannot urinate.  You have sudden, sharp, or severe pain in your abdomen or lower back.  You have a fever.  You have swelling or pain in your legs.  You have difficulty breathing. Summary  After the procedure, it is common to have mild pain when you urinate that goes away within a few minutes after you urinate. This may last for up to 1 week.  Watch for any blood in your urine. Call your health care provider if the amount of blood in your urine increases.  Take over-the-counter and prescription medicines only as told by your health care provider.  Drink   enough fluid to keep your urine pale yellow. This information is not intended to replace advice given to you by your health care provider. Make sure you discuss any questions you have with your health care provider. Document Revised: 12/30/2017 Document Reviewed: 12/31/2017 Elsevier Patient Education  2020 [place] STENT IN 72 HOURS BY GENTLY PULLING THE STRING   Post Anesthesia Home Care Instructions  Activity: Get plenty of rest for the remainder of the day. A responsible individual must stay with  you for 24 hours following the procedure.  For the next 24 hours, DO NOT: -Drive a car -Paediatric nurse -Drink alcoholic beverages -Take any medication unless instructed by your physician -Make any legal decisions or sign important papers.  Meals: Start with liquid foods such as gelatin or soup. Progress to regular foods as tolerated. Avoid greasy, spicy, heavy foods. If nausea and/or vomiting occur, drink only clear liquids until the nausea and/or vomiting subsides. Call your physician if vomiting continues.  Special Instructions/Symptoms: Your throat may feel dry or sore from the anesthesia or the breathing tube placed in your throat during surgery. If this causes discomfort, gargle with warm salt water. The discomfort should disappear within 24 hours.  No ibuprofen, Advil, Aleve, Motrin, or naproxen until after 7:15 pm today if needed.

## 2019-10-08 NOTE — Transfer of Care (Signed)
Immediate Anesthesia Transfer of Care Note  Patient: Kenneth [name]  Procedure(s) Performed: Procedure(s) (LRB): CYSTOSCOPY WITH URETEROSCOPY; RETROGRADE PYELOGRAM; STONE BASKET EXTRACTION;  STENT PLACEMENT (Right) HOLMIUM LASER APPLICATION (Right)  Patient Location: PACU  Anesthesia Type: General  Level of Consciousness: awake, sedated, patient cooperative and responds to stimulation  Airway & Oxygen Therapy: Patient Spontanous Breathing and Patient connected to [place] 02 and soft FM   Post-op Assessment: Report given to PACU RN, Post -op Vital signs reviewed and stable and Patient moving all extremities  Post vital signs: Reviewed and stable  Complications: No apparent anesthesia complications

## 2019-10-08 NOTE — Op Note (Addendum)
Preoperative diagnosis: Right ureteral stone  Postoperative diagnosis: Same  Procedure: 1 cystoscopy 2 right retrograde pyelography 3.  Intraoperative fluoroscopy, under one hour, with interpretation 4.  Right ureteroscopic stone manipulation with laser lithotripsy 5.  Right 6 x 26 JJ stent placement  Attending: Rosie Fate  Anesthesia: General  Estimated blood loss: None  Drains: Right 6 x 26 JJ ureteral stent with tether  Specimens: stone for analysis  Antibiotics: ancef  Findings: right mid ureteral calculus. Moderate proximal hydronephrosis.  Indications: Patient is a 74 year old male with a history of ureteral stone and who has failed medical expulsive therapy.  After discussing treatment options, he decided proceed with right ureteroscopic stone manipulation.  Procedure her in detail: The patient was brought to the operating room and a brief timeout was done to ensure correct patient, correct procedure, correct site.  General anesthesia was administered patient was placed in dorsal lithotomy position.  His genitalia was then prepped and draped in usual sterile fashion.  A rigid 75 French cystoscope was passed in the urethra and the bladder.  Bladder was inspected free masses or lesions.  the right ureteral orifices were in the normal orthotopic locations.  a 6 french ureteral catheter was then instilled into the right ureter orifice.  a gentle retrograde was obtained and findings noted above.  we then placed a zip wire through the ureteral catheter and advanced up to the renal pelvis.  we then removed the cystoscope and cannulated the right ureteral orifice with a semirigid ureteroscope.  we then encountered the stone in the mid ureter.  using a 200 nm laser fiber and fragmented the stone into smaller pieces.  the pieces were then removed with a engage basket.  once all stone fragments were removed we then placed a 6 x 26 double-j ureteral stent over the original zip wire. We  then removed the wire and good coil was noted in the the renal pelvis under fluoroscopy and the bladder under direct vision.     the stone fragments were then removed from the bladder and sent for analysis.   the bladder was then drained and this concluded the procedure which was well tolerated by patient.  Complications: None  Condition: Stable, extubated, transferred to PACU  Plan: Patient is to be discharged home as to follow-up in one week. He is to remove his stent in 72 hours by pulling the tether

## 2019-10-08 NOTE — Anesthesia Procedure Notes (Signed)
Procedure Name: LMA Insertion Date/Time: 10/08/2019 10:28 AM Performed by: Justice Rocher, CRNA Pre-anesthesia Checklist: Patient identified, Emergency Drugs available, Suction available, Patient being monitored and Timeout performed Patient Re-evaluated:Patient Re-evaluated prior to induction Oxygen Delivery Method: Circle system utilized Preoxygenation: Pre-oxygenation with 100% oxygen Induction Type: IV induction Ventilation: Mask ventilation without difficulty LMA: LMA inserted LMA Size: 5.0 Number of attempts: 1 Airway Equipment and Method: Bite block Placement Confirmation: positive ETCO2,  breath sounds checked- equal and bilateral and CO2 detector Tube secured with: Tape Dental Injury: Teeth and Oropharynx as per pre-operative assessment

## 2019-10-08 NOTE — H&P (Signed)
Urology Admission H&P  Chief Complaint: right flank pain  History of Present Illness: [name] is a 74yo here for right ureteroscopic stone extraction. He underwent CT in my office for intermittent gross hematuria and flank pain and was found to have multiple right distal ureteral calculi  Past Medical History:  Diagnosis Date  . Arthritis   . CAD S/P percutaneous coronary angioplasty cardiologist-  dr Ellyn Hack    06/28/16 --PCI with Synergy DES 2.75 mm 20 mm-->m RCA; 07/10/16 PCI with Synergy DES 3.5 mm x 20 mm-->mLAD  . Chronic diastolic heart failure ([place])    followed by dr harding  . Chronic dryness of both eyes   . Diverticulosis of colon   . Dyspnea on exertion   . Epiretinal membrane    "epiretinal attachment" (06/28/2016) 10-14-2017 per pt oringinally in both , now only unilateral   . GERD (gastroesophageal reflux disease)   . Heart murmur   . Hiatal hernia   . History of adenomatous polyp of colon   . History of concussion 08/23/2017   w/o loc--- per pt no residual  . History of kidney stones   . History of rheumatic fever 1958  . History of skin cancer    excision leg;  froze the left arm--- unsure BCC or SCC   . Hyperlipidemia   . Hypertension   . ILD (interstitial lung disease) ([place])    pulmologist-  dr Elsworth Soho--  secondary to methotrexate use --- last PFTs 04-25-2015  mild restriction  . Iron deficiency anemia   . Mild obstructive sleep apnea    10-04-2019 per pt  had a sleep study years ago , was told no cpap recommended  . Nephrolithiasis    CT 09-07-2019 bilateral nonobstructive stones  . Paroxysmal atrial fibrillation [facility] cardiologist-  dr harding/  EP-- dr allred   a. 11/2003 Tikosyn initiated - subsequently d/c'd;  b. 2006 [place] for Afib @ [place];  c. 09/2011 Echo: EF 60-65%, Gr 2 DD;  d. DCCV 10/2011 , 04/2012, 12/ 2014, & 02/ 2015;  e. RFCA 01-13-2014  . Plaque psoriasis 1969   followed by rheumatologist  and dermatologist @ [facility] in [place]  . Right ureteral  stone   . S/P drug eluting coronary stent placement    06-28-2016  x1 to mRCA;  07-10-2016 x1 to mLAD  . Seasonal allergies   . Type 2 diabetes mellitus ([place])    followed by pcp  . Ureteral calculus, right   . Wears hearing aid in both ears    Past Surgical History:  Procedure Laterality Date  . APPENDECTOMY  1996  . ATRIAL FIBRILLATION ABLATION N/A 01/13/2014   repeat PVI, also left atrial ablation performed with successfull ablation of LA flutter by Dr Rayann Heman  . ATRIAL FIBRILLATION ABLATION  08/2004   Dr Rolland Porter at [facility] [place]  . CARDIAC CATHETERIZATION  06-30-2003  dr Lia Foyer   no significant coronary obstruction, well preserved LVF, recurrent afib  . CARDIOVERSION N/A 05/22/2013   Procedure: CARDIOVERSION;  Surgeon: [name], MD;  Location: [place];  Service: Cardiovascular;  Laterality: N/A;  . CARDIOVERSION N/A 10/23/2011   Procedure: CARDIOVERSION;  Surgeon: Deboraha Sprang, MD;  Location: [facility] CATH LAB;  Service: Cardiovascular;  Laterality: N/A;  . CARDIOVERSION N/A 05/01/2012   Procedure: CARDIOVERSION;  Surgeon: Deboraha Sprang, MD;  Location: [facility] CATH LAB;  Service: Cardiovascular;  Laterality: N/A;  . CARPAL TUNNEL RELEASE Right 1980s  . CATARACT EXTRACTION W/ INTRAOCULAR LENS  IMPLANT,  BILATERAL  2012  approx.  . CORONARY STENT INTERVENTION N/A 06/28/2016   Procedure: Coronary Stent Intervention;  Surgeon: Leonie Man, MD;  Location: [place] CV LAB;  Service: Cardiovascular;  Laterality: mRCA PCI -Synergy DES 2.75 m x 20 mm (3.1 mm)  . CORONARY STENT INTERVENTION N/A 07/10/2016   Procedure: Coronary Stent Intervention;  Surgeon: Leonie Man, MD;  Location: [place] CV LAB;  Service: Cardiovascular: mLAD PCI Synergy DES 3.5 mm x 20 mm)  . CYSTOSCOPY WITH RETROGRADE PYELOGRAM, URETEROSCOPY AND STENT PLACEMENT Right 10/16/2017   Procedure: CYSTOSCOPY WITH RIGHT RETROGRADE RIGHT URETEROSCOPY AND STONE BASKET EXTRACTION;  Surgeon: Irine Seal, MD;  Location:  [facility]  Service: Urology;  Laterality: Right;  . CYSTOSCOPY/RETROGRADE/URETEROSCOPY/STONE EXTRACTION WITH BASKET Right 10/27/2017   Procedure: CYSTOSCOPY/RETROGRADE/URETEROSCOPY/STONE EXTRACTION WITH BASKET/URETERAL STENT PLACEMENT and laser;  Surgeon: Irine Seal, MD;  Location: [facility];  Service: Urology;  Laterality: Right;  . HOLMIUM LASER APPLICATION Right 2/69/4854   Procedure: POSSIBLE HOLMIUM LASER APPLICATION;  Surgeon: Irine Seal, MD;  Location: [facility];  Service: Urology;  Laterality: Right;  . INTRAVASCULAR PRESSURE WIRE/FFR STUDY N/A 06/28/2016   Procedure: Intravascular Pressure Wire/FFR Study;  Surgeon: Leonie Man, MD;  Location: [place] CV LAB;  Service: Cardiovascular: FFR of mLAD ~65% lesion = 0.75 post --> Staged PCI  . NASAL SINUS SURGERY     x 2  . PROXIMAL INTERPHALANGEAL FUSION (PIP) Left 01-05-2001    dr graves   correction clawtoe and extensor tendon lengthening  . RIGHT/LEFT HEART CATH AND CORONARY ANGIOGRAPHY N/A 06/28/2016   Procedure: Right/Left Heart Cath and Coronary Angiography;  Surgeon: Leonie Man, MD;  Location: [place] CV LAB;  Service: Cardiovascular: mRCA 99% (TIMI 2), mLAD ~65% (FFR 0.75), OM3 55%. mild Pulm HTN. Mod LVEDP elevation. EF 55-65%.  Marland Kitchen ROTATOR CUFF REPAIR Bilateral last one 2014  . TONSILLECTOMY  age 23  . URETEROSCOPIC LASER LITHOTRIPSY STONE EXTRACTIONS/  STENT PLACEMENT Bilateral 09-10-2007   dr Jeffie Pollock  [facility]   per pt has had several prior ureteroscopic stone extraction since age 70 , last one 09-10-2007    Home Medications:  Current Facility-Administered Medications  Medication Dose Route Frequency Provider Last Rate Last Admin  . ceFAZolin (ANCEF) IVPB 2g/100 mL premix  2 g Intravenous 30 min Pre-Op Kawthar Ennen, Candee Furbish, MD      . lactated ringers infusion   Intravenous Continuous Brennan Bailey, MD      . lactated ringers infusion   Intravenous Continuous Barnet Glasgow, MD 50 mL/hr at 10/08/19 0935  New Bag at 10/08/19 0935   Facility-Administered Medications Ordered in Other Encounters  Medication Dose Route Frequency Provider Last Rate Last Admin  . lidocaine (cardiac) 100 mg/43ml (XYLOCAINE) 20 MG/ML injection 2%    Anesthesia Intra-op Flowers, Rokoshi T, CRNA   60 mg at 03/25/13 1617  . propofol (DIPRIVAN) 10 mg/mL bolus/IV push    Anesthesia Intra-op Flowers, Rokoshi T, CRNA   90 mg at 03/25/13 1617   Allergies:  Allergies  Allergen Reactions  . Hydrocodone Shortness Of Breath    In combination with decongestants  . Other Palpitations and Other (See Comments)    ALL DECONGESTANTS - CAUSE PALPITATIONS; THROWS HEART RHYTHM OUT OF BALANCE  . Oxycodone Shortness Of Breath  . Pseudoephedrine Palpitations  . Tramadol Shortness Of Breath and Rash    flushing  . Gemfibrozil     Other reaction(s): Dizziness    Family History  Problem Relation  Age of Onset  . Coronary artery disease Mother   . Breast cancer Mother   . Colon cancer Mother   . Heart disease Maternal Grandmother        MI  . Stroke Paternal Grandfather        CVA  . Coronary artery disease Paternal Grandfather   . Esophageal cancer Neg Hx   . Stomach cancer Neg Hx   . Rectal cancer Neg Hx    Social History:  reports that he quit smoking about 22 years ago. His smoking use included cigars. He quit after 10.00 years of use. He has quit using smokeless tobacco.  His smokeless tobacco use included snuff and chew. He reports that he does not drink alcohol and does not use drugs.  Review of Systems  Genitourinary: Positive for flank pain and hematuria.  All other systems reviewed and are negative.   Physical Exam:  Vital signs in last 24 hours: Temp:  [97.5 F (36.4 C)] 97.5 F (36.4 C) (07/02 0910) Pulse Rate:  [82] 82 (07/02 0910) Resp:  [16] 16 (07/02 0910) BP: (143)/(81) 143/81 (07/02 0910) SpO2:  [99 %] 99 % (07/02 0910) Weight:  [101 kg] 101 kg (07/02 0910) Physical Exam Vitals reviewed.   Constitutional:      Appearance: Normal appearance.  HENT:     Head: Normocephalic and atraumatic.  Eyes:     Extraocular Movements: Extraocular movements intact.     Pupils: Pupils are equal, round, and reactive to light.  Cardiovascular:     Rate and Rhythm: Normal rate and regular rhythm.  Pulmonary:     Effort: Pulmonary effort is normal. No respiratory distress.  Abdominal:     General: Abdomen is flat. There is no distension.  Musculoskeletal:        General: Normal range of motion.     Cervical back: Normal range of motion and neck supple.  Skin:    General: Skin is warm and dry.  Neurological:     General: No focal deficit present.     Mental Status: He is alert and oriented to person, place, and time.  Psychiatric:        Mood and Affect: Mood normal.        Behavior: Behavior normal.        Thought Content: Thought content normal.        Judgment: Judgment normal.     Laboratory Data:  Results for orders placed or performed during the hospital encounter of 10/08/19 (from the past 24 hour(s))  I-STAT, chem 8     Status: Abnormal   Collection Time: 10/08/19  9:01 AM  Result Value Ref Range   Sodium 141 135 - 145 mmol/L   Potassium 3.8 3.5 - 5.1 mmol/L   Chloride 103 98 - 111 mmol/L   BUN 11 8 - 23 mg/dL   Creatinine, Ser 0.80 0.61 - 1.24 mg/dL   Glucose, Bld 137 (H) 70 - 99 mg/dL   Calcium, Ion 1.23 1.15 - 1.40 mmol/L   TCO2 28 22 - 32 mmol/L   Hemoglobin 11.9 (L) 13.0 - 17.0 g/dL   HCT 35.0 (L) 39 - 52 %   Recent Results (from the past 240 hour(s))  SARS CORONAVIRUS 2 (TAT 6-24 HRS) Nasopharyngeal Nasopharyngeal Swab     Status: None   Collection Time: 10/05/19  8:55 AM   Specimen: Nasopharyngeal Swab  Result Value Ref Range Status   SARS Coronavirus 2 NEGATIVE NEGATIVE Final    Comment: (  NOTE) SARS-CoV-2 target nucleic acids are NOT DETECTED.  The SARS-CoV-2 RNA is generally detectable in upper and lower respiratory specimens during the acute phase  of infection. Negative results do not preclude SARS-CoV-2 infection, do not rule out co-infections with other pathogens, and should not be used as the sole basis for treatment or other patient management decisions. Negative results must be combined with clinical observations, patient history, and epidemiological information. The expected result is Negative.  Fact Sheet for Patients: SugarRoll.be  Fact Sheet for Healthcare Providers: https://www.woods-mathews.com/  This test is not yet approved or cleared by the [place] FDA and  has been authorized for detection and/or diagnosis of SARS-CoV-2 by FDA under an Emergency Use Authorization (EUA). This EUA will remain  in effect (meaning this test can be used) for the duration of the COVID-19 declaration under Se ction 564(b)(1) of the Act, 21 U.S.C. section 360bbb-3(b)(1), unless the authorization is terminated or revoked sooner.  Performed at [facility], [place] [address] [place], [place] [address]    Creatinine: Recent Labs    10/08/19 0901  CREATININE 0.80   Baseline Creatinine: 0.8  Impression/Assessment:  74yo with right ureteral calculi  Plan:  The risks/benefits/alternaitves to right ureteroscopic stone extraction was explained to the patient and he understands and wishes to proceed with surgery  Nicolette Bang 10/08/2019, 10:00 AM

## 2019-10-12 ENCOUNTER — Encounter (HOSPITAL_BASED_OUTPATIENT_CLINIC_OR_DEPARTMENT_OTHER): Payer: Self-pay | Admitting: Urology

## 2019-11-12 DIAGNOSIS — R278 Other lack of coordination: Secondary | ICD-10-CM | POA: Diagnosis not present

## 2019-11-12 DIAGNOSIS — M4310 Spondylolisthesis, site unspecified: Secondary | ICD-10-CM | POA: Diagnosis not present

## 2019-11-12 DIAGNOSIS — M4300 Spondylolysis, site unspecified: Secondary | ICD-10-CM | POA: Diagnosis not present

## 2019-11-12 DIAGNOSIS — M5136 Other intervertebral disc degeneration, lumbar region: Secondary | ICD-10-CM | POA: Diagnosis not present

## 2019-11-12 DIAGNOSIS — M47816 Spondylosis without myelopathy or radiculopathy, lumbar region: Secondary | ICD-10-CM | POA: Diagnosis not present

## 2019-11-12 DIAGNOSIS — M5416 Radiculopathy, lumbar region: Secondary | ICD-10-CM | POA: Diagnosis not present

## 2019-11-12 DIAGNOSIS — M48061 Spinal stenosis, lumbar region without neurogenic claudication: Secondary | ICD-10-CM | POA: Diagnosis not present

## 2019-11-30 DIAGNOSIS — M5136 Other intervertebral disc degeneration, lumbar region: Secondary | ICD-10-CM | POA: Diagnosis not present

## 2019-11-30 DIAGNOSIS — M4726 Other spondylosis with radiculopathy, lumbar region: Secondary | ICD-10-CM | POA: Diagnosis not present

## 2019-12-03 DIAGNOSIS — I700 Atherosclerosis of aorta: Secondary | ICD-10-CM | POA: Diagnosis not present

## 2019-12-03 DIAGNOSIS — R3121 Asymptomatic microscopic hematuria: Secondary | ICD-10-CM | POA: Diagnosis not present

## 2019-12-03 DIAGNOSIS — N5201 Erectile dysfunction due to arterial insufficiency: Secondary | ICD-10-CM | POA: Diagnosis not present

## 2019-12-03 DIAGNOSIS — N200 Calculus of kidney: Secondary | ICD-10-CM | POA: Diagnosis not present

## 2019-12-22 ENCOUNTER — Other Ambulatory Visit: Payer: Self-pay

## 2019-12-22 ENCOUNTER — Ambulatory Visit (INDEPENDENT_AMBULATORY_CARE_PROVIDER_SITE_OTHER): Payer: Medicare Other | Admitting: Cardiology

## 2019-12-22 ENCOUNTER — Encounter: Payer: Self-pay | Admitting: Cardiology

## 2019-12-22 DIAGNOSIS — E1169 Type 2 diabetes mellitus with other specified complication: Secondary | ICD-10-CM | POA: Diagnosis not present

## 2019-12-22 DIAGNOSIS — I484 Atypical atrial flutter: Secondary | ICD-10-CM | POA: Diagnosis not present

## 2019-12-22 DIAGNOSIS — E785 Hyperlipidemia, unspecified: Secondary | ICD-10-CM

## 2019-12-22 DIAGNOSIS — I5032 Chronic diastolic (congestive) heart failure: Secondary | ICD-10-CM | POA: Diagnosis not present

## 2019-12-22 DIAGNOSIS — I25119 Atherosclerotic heart disease of native coronary artery with unspecified angina pectoris: Secondary | ICD-10-CM | POA: Diagnosis not present

## 2019-12-22 DIAGNOSIS — I10 Essential (primary) hypertension: Secondary | ICD-10-CM | POA: Diagnosis not present

## 2019-12-22 NOTE — Progress Notes (Signed)
Primary Care Provider: [facility] Cardiologist: [name], MD Electrophysiologist: None  Clinic Note: Chief Complaint  Patient presents with  . Follow-up    Annual - on new c/o  . Coronary Artery Disease    no angina; stable DOE    HPI:    Kenneth [name] is a 74 y.o. male with a PMH notable for CAD-PCI (- no longer on Plavix 2/2 anemia, & Op Afib s/p ablation --> no OAC b/c no recurrence) & ILD (with chronic dyspnea), ? Psoriatic arthritis (on Humira),  & recurrent Nephrolithiasis / Ureteorlithiasiswho presents today for annual f/u.   Long-standing H/o A flutter/ Fib (fluter ablation in 2006)  01/2014 - Afib Ablation (no longer on [place])  06/2016 - R&LHC - normal RHC pressures  (mild Pulm HTN)- severe mid RCA 95% --> DES PCI; residual p-mLAD lesion (FFR 0.75); CoDom LCx distal 60% & OM3 ~55%.   07/2016 - Cath-Staged PCI of LAD.   Kenneth [name] was last seen on 12/21/2019 - was doing relatively well from a Cardiology standpoint -- very depressed & emotional as caregiver for his wife who has Cancer.  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  --> No CP or sense of palpitations.  Felt worn out emotionally, stable DOE (likely related to ILD). Improved bruising off of Plavix. STill walking ~ 1 mile per day & doing work on the property. Not sleeping well (nightmares & taking care of his wife). Mild edema. Neuropathy.   Recent Hospitalizations:  10/08/2019 - Cystoscopy w/ Ureteroscopy, retrograde Pyelogram   09/07/2019 ER - Ureterolithiasis / Nephrolithiasis  Reviewed  CV studies:    The following studies were reviewed today: (if available, images/films reviewed: From Epic Chart or Care Everywhere) . none:   Interval History:   Kenneth [name] returns today for routing f/u doing relatively well from a cardiac standpoint.  NO chest pain with rest or exertion.   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  He has been busy mowing, weed-wacking, bush-wacking etc.    Has been under lots of stress with his role as care giver.  Has not been eating normally - basically eating if she eats & often goes without meals.   Had been doing fairly well until the last Oncology visit yesterday when the MD told them that he feared that the cancer would "come back / metastasize".  This "downer" news has had him totally stressed -- BP up & even noting occasional palpitations.  This basically "put him in shock".    His wife has been quite sick (after being his support & caregiver for years) - CA & CVA x 2.  Still fighting, but getting tired - is now total care -- this really has him worn out & is concerned about recurrence of CA.    No SSx of Angina, CHF or arrhythmia.  No easy bruising.   CV Review of Symptoms (Summary): positive for - dyspnea on exertion, palpitations and - baseline fatigue & dyspnea from ILD, but still able to do most of what he wants negative for - chest pain, edema, irregular heartbeat, orthopnea, paroxysmal nocturnal dyspnea, shortness of breath or lightheadedness / dizziness (unless  working out in heat), syncope/near syncope; TIA / amaurosis fugax, claudicaiton.  The patient does not have symptoms concerning for COVID-19 infection (fever, chills, cough, or new shortness of breath).   REVIEWED OF SYSTEMS   Review of Systems  Constitutional: Positive for malaise/fatigue (stable unless not enough sleep) and weight loss (significant - not eating as much; partly due to his wife not eating)).  HENT: Negative for congestion and nosebleeds.   Respiratory: Positive for cough (off & on), shortness of breath (baseline) and wheezing (sometimes). Negative for hemoptysis.    Gastrointestinal: Negative for blood in stool and melena.  Genitourinary: Negative for hematuria.  Musculoskeletal: Positive for back pain and joint pain. Negative for falls.  Neurological: Positive for dizziness (only when working out in the heat). Negative for focal weakness, weakness and headaches.  Endo/Heme/Allergies: Does not bruise/bleed easily.  Psychiatric/Behavioral: Positive for depression. Negative for memory loss. The patient is nervous/anxious and has insomnia.    I have reviewed and (if needed) personally updated the patient's problem list, medications, allergies, past medical and surgical history, social and family history.   PAST MEDICAL HISTORY   Past Medical History:  Diagnosis Date  . Arthritis   . CAD S/P percutaneous coronary angioplasty cardiologist-  dr Ellyn Hack    06/28/16 --PCI with Synergy DES 2.75 mm 20 mm-->m RCA; 07/10/16 PCI with Synergy DES 3.5 mm x 20 mm-->mLAD  . Chronic diastolic heart failure ([place])    followed by dr Giovonnie Trettel  . Chronic dryness of both eyes   . Diverticulosis of colon   . Dyspnea on exertion   . Epiretinal membrane    "epiretinal attachment" (06/28/2016) 10-14-2017 per pt oringinally in both , now only unilateral   . GERD (gastroesophageal reflux disease)   . Heart murmur   . Hiatal hernia   . History of adenomatous polyp of colon   . History of concussion 08/23/2017   w/o loc--- per pt no residual  . History of kidney stones   . History of rheumatic fever 1958  . History of skin cancer    excision leg;  froze the left arm--- unsure BCC or SCC   . Hyperlipidemia   . Hypertension   . ILD (interstitial lung disease) ([place])    pulmologist-  dr Elsworth Soho--  secondary to methotrexate use --- last PFTs 04-25-2015  mild restriction  . Iron deficiency anemia   . Mild obstructive sleep apnea    10-04-2019 per pt  had a sleep study years ago , was told no cpap recommended  . Nephrolithiasis    CT 09-07-2019 bilateral nonobstructive stones  .  Paroxysmal atrial fibrillation [facility]) cardiologist-  dr Meenakshi Sazama/  EP-- dr allred   a. 11/2003 Tikosyn initiated - subsequently d/c'd;  b. 2006 [place] for Afib @ [facility];  c. 09/2011 Echo: EF 60-65%, Gr 2 DD;  d. DCCV 10/2011 , 04/2012, 12/ 2014, & 02/ 2015;  e. RFCA 01-13-2014  . Plaque psoriasis 1969   followed by rheumatologist  and dermatologist @ [facility] in [place]  . Right ureteral stone   . S/P drug eluting coronary stent placement    06-28-2016  x1 to mRCA;  07-10-2016 x1 to mLAD  . Seasonal allergies   . Type 2 diabetes mellitus ([place])    followed by pcp  . Ureteral calculus, right   . Wears hearing aid in both ears     PAST SURGICAL HISTORY   Past Surgical History:  Procedure Laterality Date  . APPENDECTOMY  1996  . ATRIAL  FIBRILLATION ABLATION N/A 01/13/2014   repeat PVI, also left atrial ablation performed with successfull ablation of LA flutter by Dr Rayann Heman  . ATRIAL FIBRILLATION ABLATION  08/2004   Dr Rolland Porter at [facility] [place])  . CARDIAC CATHETERIZATION  06-30-2003  dr Lia Foyer   no significant coronary obstruction, well preserved LVF, recurrent afib  . CARDIOVERSION N/A 05/22/2013   Procedure: CARDIOVERSION;  Surgeon: [name], MD;  Location: [place];  Service: Cardiovascular;  Laterality: N/A;  . CARDIOVERSION N/A 10/23/2011   Procedure: CARDIOVERSION;  Surgeon: Deboraha Sprang, MD;  Location: [facility] CATH LAB;  Service: Cardiovascular;  Laterality: N/A;  . CARDIOVERSION N/A 05/01/2012   Procedure: CARDIOVERSION;  Surgeon: Deboraha Sprang, MD;  Location[facility] CATH LAB;  Service: Cardiovascular;  Laterality: N/A;  . CARPAL TUNNEL RELEASE Right 1980s  . CATARACT EXTRACTION W/ INTRAOCULAR LENS  IMPLANT, BILATERAL  2012  approx.  . CORONARY STENT INTERVENTION N/A 06/28/2016   Procedure: Coronary Stent Intervention;  Surgeon: Leonie Man, MD;  Location: [place] CV LAB;  Service: Cardiovascular;  Laterality: mRCA PCI -Synergy DES 2.75 m x 20 mm (3.1 mm)  . CORONARY STENT  INTERVENTION N/A 07/10/2016   Procedure: Coronary Stent Intervention;  Surgeon: Leonie Man, MD;  Location: [place] CV LAB;  Service: Cardiovascular: mLAD PCI Synergy DES 3.5 mm x 20 mm)  . CYSTOSCOPY WITH RETROGRADE PYELOGRAM, URETEROSCOPY AND STENT PLACEMENT Right 10/16/2017   Procedure: CYSTOSCOPY WITH RIGHT RETROGRADE RIGHT URETEROSCOPY AND STONE BASKET EXTRACTION;  Surgeon: Irine Seal, MD;  Location: [facility];  Service: Urology;  Laterality: Right;  . CYSTOSCOPY WITH RETROGRADE PYELOGRAM, URETEROSCOPY AND STENT PLACEMENT Right 10/08/2019   Procedure: CYSTOSCOPY WITH URETEROSCOPY; RETROGRADE PYELOGRAM; STONE BASKET EXTRACTION;  STENT PLACEMENT;  Surgeon: Cleon Gustin, MD;  Location: [facility];  Service: Urology;  Laterality: Right;  1 HR  . CYSTOSCOPY/RETROGRADE/URETEROSCOPY/STONE EXTRACTION WITH BASKET Right 10/27/2017   Procedure: CYSTOSCOPY/RETROGRADE/URETEROSCOPY/STONE EXTRACTION WITH BASKET/URETERAL STENT PLACEMENT and laser;  Surgeon: Irine Seal, MD;  Location: [facility];  Service: Urology;  Laterality: Right;  . HOLMIUM LASER APPLICATION Right 1/32/4401   Procedure: POSSIBLE HOLMIUM LASER APPLICATION;  Surgeon: Irine Seal, MD;  Location: [facility];  Service: Urology;  Laterality: Right;  . HOLMIUM LASER APPLICATION Right 0/05/7251   Procedure: HOLMIUM LASER APPLICATION;  Surgeon: Cleon Gustin, MD;  Location: [facility];  Service: Urology;  Laterality: Right;  . INTRAVASCULAR PRESSURE WIRE/FFR STUDY N/A 06/28/2016   Procedure: Intravascular Pressure Wire/FFR Study;  Surgeon: Leonie Man, MD;  Location: [place] CV LAB;  Service: Cardiovascular: FFR of mLAD ~65% lesion = 0.75 post --> Staged PCI  . NASAL SINUS SURGERY     x 2  . PROXIMAL INTERPHALANGEAL FUSION (PIP) Left 01-05-2001    dr graves   correction clawtoe and extensor tendon lengthening  . RIGHT/LEFT HEART CATH AND CORONARY ANGIOGRAPHY N/A 06/28/2016    Procedure: Right/Left Heart Cath and Coronary Angiography;  Surgeon: Leonie Man, MD;  Location: [place] CV LAB;  Service: Cardiovascular: mRCA 99% (TIMI 2), mLAD ~65% (FFR 0.75), OM3 55%. mild Pulm HTN. Mod LVEDP elevation. EF 55-65%.  Marland Kitchen ROTATOR CUFF REPAIR Bilateral last one 2014  . TONSILLECTOMY  age 25  . URETEROSCOPIC LASER LITHOTRIPSY STONE EXTRACTIONS/  STENT PLACEMENT Bilateral 09-10-2007   dr Jeffie Pollock  [facility]   per pt has had several prior ureteroscopic stone extraction since age 44 , last one 09-10-2007    Immunization History  Administered Date(s) Administered  . Fluad Quad(high Dose 65+) 11/30/2018  . H1N1 03/18/2008  . Influenza Split 03/27/2011, 01/17/2017  . Influenza Whole 02/24/2008, 01/12/2009  . Influenza, High Dose Seasonal PF 01/15/2016, 12/30/2016, 01/26/2018  . Influenza,inj,Quad PF,6+ Mos 01/03/2014  . Influenza-Unspecified 01/23/2015  . Pneumococcal Polysaccharide-23 11/30/2018  . Pneumococcal-Unspecified 01/15/2016   Has had COVID vaccines x 3  Through the [facility]  MEDICATIONS/ALLERGIES   Current Meds  Medication Sig  . acetaminophen (TYLENOL) 500 MG tablet Take 1,000 mg by mouth in the morning and at bedtime.   . Adalimumab (HUMIRA) 40 MG/0.4ML PSKT Inject 40 mg into the skin every 14 (fourteen) days.   Marland Kitchen aspirin EC 81 MG tablet Take 1 tablet (81 mg total) by mouth daily. Do not start until MAY 8,2020 (Patient taking differently: Take 81 mg by mouth daily. )  . Biotin 1000 MCG tablet Take 1,000 mcg by mouth daily.  . calcipotriene (DOVONOX) 0.005 % cream Apply topically 2 (two) times daily as needed.  . Calcium Carb-Cholecalciferol (CALCIUM+D3) 600-800 MG-UNIT TABS Take 2 tablets by mouth daily.  . cetirizine (ZYRTEC) 10 MG tablet Take 10 mg by mouth daily as needed.   . clobetasol cream (TEMOVATE) 1.61 % Apply 1 application topically 2 (two) times daily as needed (for psoriasis).  Marland Kitchen diltiazem (CARDIZEM CD) 180 MG 24 hr capsule Take 1 capsule (180 mg total)  by mouth daily. (Patient taking differently: Take 180 mg by mouth at bedtime. )  . ferrous sulfate 325 (65 FE) MG tablet Take 325 mg by mouth 2 (two) times a week. Wednesday's and Saturday's  . Flurandrenolide (CORDRAN) 4 MCG/SQCM TAPE Apply 1 each topically See admin instructions. Wrap sores caused by psoriasis as needed until wounds heal.  . gabapentin (NEURONTIN) 300 MG capsule Take 300 mg by mouth in the morning and at bedtime.   . metFORMIN (GLUCOPHAGE) 500 MG tablet Take 500 mg by mouth every evening.   . Misc Natural Products (NEURIVA) CAPS Take 1 capsule by mouth daily.  . pantoprazole (PROTONIX) 40 MG tablet Take 40 mg by mouth as needed.  Vladimir Faster Glycol-Propyl Glycol (SYSTANE OP) Place 1 drop into both eyes daily.  Vladimir Faster Glycol-Propyl Glycol (SYSTANE) 0.4-0.3 % GEL ophthalmic gel Place 1 application into both eyes at bedtime.   . predniSONE (DELTASONE) 5 MG tablet Take 1 tablet (5 mg) daily in the morning (Patient taking differently: Take 5 mg by mouth daily with breakfast. Take 1 tablet (5 mg) daily in the morning)  . Probiotic Product (PROBIOTIC DAILY) CAPS Take by mouth daily.  . tamsulosin (FLOMAX) 0.4 MG CAPS capsule Take 0.4 mg by mouth every evening.     Allergies  Allergen Reactions  . Hydrocodone Shortness Of Breath    In combination with decongestants  . Other Palpitations and Other (See Comments)    ALL DECONGESTANTS - CAUSE PALPITATIONS; THROWS HEART RHYTHM OUT OF BALANCE  . Oxycodone Shortness Of Breath  . Pseudoephedrine Palpitations  . Tramadol Shortness Of Breath and Rash    flushing  . Gemfibrozil     Other reaction(s): Dizziness    SOCIAL HISTORY/FAMILY HISTORY   Reviewed in Epic:  Pertinent findings: See note above RE: caregiver for wife Judeen Hammans). Social History   Tobacco Use  . Smoking status: Former Smoker    Years: 10.00    Types: Cigars    Quit date: 09/29/1997    Years since quitting: 22.2  . Smokeless tobacco: Former Systems developer    Types:  Snuff, Loss adjuster, chartered  Substance Use Topics  . Alcohol use: No  . Drug use: No   Social History   Social History Narrative   Pt lives in [place] with spouse.     Retired.    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       After retiring from [place].        Had been very active as an Hotel manager"  - now doing his best to "stay out of this mess of politics" -- has declined efforts to get him to run for office.      Now is primary caregiver for his wife Judeen Hammans) - who has metastatic CA & has had CVA x 2.  Pretty much "total care".  Very emotionally taxing.      He has plans to build homes for all of his children & grandchildren on their property - to ensure they all have a place to live - Not transferable     OBJCTIVE -PE, EKG, labs   Wt Readings from Last 3 Encounters:  12/22/19 225 lb 9.6 oz (102.3 kg)  10/08/19 222 lb 11.2 oz (101 kg)  09/21/19 226 lb 12.8 oz (102.9 kg)    Physical Exam: BP (!) 152/64   Pulse 70   Ht 6\' 3"  (1.905 m)   Wt 225 lb 9.6 oz (102.3 kg)   SpO2 97%   BMI 28.20 kg/m  Physical Exam Constitutional:      General: He is not in acute distress.    Appearance: Normal appearance. He is not ill-appearing.     Comments: No longer obese - notable wgt loss; looks physically strong & healthy  HENT:     Head: Normocephalic and atraumatic.  Cardiovascular:     Rate and Rhythm: Normal rate and regular rhythm.     Pulses: Normal pulses.     Heart sounds: Normal heart sounds. No murmur heard.  No friction rub. No gallop.   Pulmonary:     Effort: Pulmonary effort is normal. No respiratory distress.     Breath sounds: No wheezing, rhonchi or rales.     Comments: Mild diffuse interstitial crackles - no Rales, Rhonchi or wheezing. Chest:     Chest wall: No tenderness.  Abdominal:     General: Abdomen  is flat. Bowel sounds are normal.     Palpations: Abdomen is soft. There is no mass (no HSM).  Musculoskeletal:        General: Swelling (trivial LE swelling) present. Normal range of motion.  Neurological:     General: No focal deficit present.     Mental Status: He is alert and oriented to person, place, and time.  Psychiatric:        Behavior: Behavior normal.        Thought Content: Thought content normal.        Judgment: Judgment normal.     Comments: Less upset than last year -- just looks tired & sad; not tearful      Adult ECG Report n/a  Recent Labs:  No labs in ~2 yrs  Lab Results  Component Value Date   CHOL 174 05/12/2017   HDL 49 05/12/2017   LDLCALC 86 05/12/2017   TRIG 197 (H) 05/12/2017   CHOLHDL 3.6 05/12/2017   Lab Results  Component Value Date   CREATININE 0.80 10/08/2019   BUN 11  10/08/2019   NA 141 10/08/2019   K 3.8 10/08/2019   CL 103 10/08/2019   CO2 23 09/07/2019   Lab Results  Component Value Date   TSH 1.018 06/25/2016    ASSESSMENT/PLAN    Problem List Items Addressed This Visit    Coronary artery disease involving native coronary artery with angina pectoris ([place]) (Chronic)    Doing well - no further angina after PCI & on Diltiazem (in lieu of Beta Blocker -- acceptable with preserved EF) -> vasodilation & rate control.  No longer on Plavix - on ASA only. - 2/2 bruising issues Back on moderate dose rosuvastatin - unable to increase dose 2/2 myalgias / fatigue.  BP is up - but related to stress - better @ home - if stays up, consider ARB        Diastolic CHF, chronic (HCC) (Chronic)    Euvolemic on exam . NO diuretic requirement.  Class I-II Sx - but DOE more related to ILD.      Atypical atrial flutter (HCC) (Chronic)    Flutter ablation 2006; Fib ablation 2015 - no recurrence.  NO longer on [place].      Hyperlipidemia with target low density lipoprotein (LDL) cholesterol less than 70 mg/dL (Chronic)    Due to have labs checked  by PCP via [place] soon - will ask for a copy to be faxed/mailed.  Continue current dose of rosuvastatin for now.      Essential hypertension (Chronic)    BP high today - but usually lower @ home -- lots of stress since Oncology visit with [facility] yesterday. He was not ready to hear "Bad News" - still "in shock".  Not ready to "let her go".    Monitor - may need ARB.      Dyslipidemia associated with type 2 diabetes mellitus (HCC) (Chronic)    On statin - labs followed by [place] -(will try to get reports).  Continue current dose of statin for now - but may need to adjust Rx if not @ goal.  Probably would consider Zetia or Nexletol + rosuvastatin vs. Increased dose of statin to avoid side effects.  For DM - consider SGLT2-Inhibitor.          COVID-19 Education: The signs and symptoms of COVID-19 were discussed with the patient and how to seek care for testing (follow up with PCP or arrange E-visit).   The importance of social distancing and COVID-19 vaccination was discussed today. 2 min The patient is practicing social distancing & Masking.   I spent a total of 1minutes with the patient spent in direct patient consultation.  Additional time spent with chart review  / charting (studies, outside notes, etc): 10 Total Time: 43 min  Current medicines are reviewed at length with the patient today.  (+/- concerns) n/a  Notice: This dictation was prepared with Dragon dictation along with smaller phrase technology. Any transcriptional errors that result from this process are unintentional and may not be corrected upon review.  Patient Instructions / Medication Changes & Studies & Tests Ordered   Patient Instructions  Medication Instructions:  No changes *If you need a refill on your cardiac medications before your next appointment, please call your pharmacy*   Lab Work: Not needed   Testing/Procedures: Not needed   Follow-Up: At [facility], you and your health needs are our  priority.  As part of our continuing mission to provide you with exceptional heart care, we have created designated Provider Care Teams.  These Care Teams include your primary Cardiologist (physician) and Advanced Practice Providers (APPs -  Physician Assistants and Nurse Practitioners) who all work together to provide you with the care you need, when you need it.  We recommend signing up for the patient portal called "MyChart".  Sign up information is provided on this After Visit Summary.  MyChart is used to connect with patients for Virtual Visits (Telemedicine).  Patients are able to view lab/test results, encounter notes, upcoming appointments, etc.  Non-urgent messages can be sent to your provider as well.   To learn more about what you can do with MyChart, go to [email or web address].    Your next appointment:   12 month(s)  The format for your next appointment:   In Person  Provider:   [name], MD    Studies Ordered:   No orders of the defined types were placed in this encounter.    [name], M.D., M.S. Interventional Cardiologist   Pager # [phone] Phone # [phone] [address] [place], [place] [address]   Thank you for choosing Heartcare at [facility]!!

## 2019-12-22 NOTE — Patient Instructions (Signed)
Medication Instructions:  No changes   *If you need a refill on your cardiac medications before your next appointment, please call your pharmacy*   Lab Work: Not needed      Testing/Procedures:  Not needed  Follow-Up: At [facility], you and your health needs are our priority.  As part of our continuing mission to provide you with exceptional heart care, we have created designated Provider Care Teams.  These Care Teams include your primary Cardiologist (physician) and Advanced Practice Providers (APPs -  Physician Assistants and Nurse Practitioners) who all work together to provide you with the care you need, when you need it.  We recommend signing up for the patient portal called "MyChart".  Sign up information is provided on this After Visit Summary.  MyChart is used to connect with patients for Virtual Visits (Telemedicine).  Patients are able to view lab/test results, encounter notes, upcoming appointments, etc.  Non-urgent messages can be sent to your provider as well.   To learn more about what you can do with MyChart, go to [email or web address].    Your next appointment:   12 month(s)  The format for your next appointment:   In Person  Provider:   David Harding, MD    

## 2019-12-29 ENCOUNTER — Encounter: Payer: Self-pay | Admitting: Cardiology

## 2019-12-29 DIAGNOSIS — M4310 Spondylolisthesis, site unspecified: Secondary | ICD-10-CM | POA: Diagnosis not present

## 2019-12-29 DIAGNOSIS — M48061 Spinal stenosis, lumbar region without neurogenic claudication: Secondary | ICD-10-CM | POA: Diagnosis not present

## 2019-12-29 DIAGNOSIS — M4300 Spondylolysis, site unspecified: Secondary | ICD-10-CM | POA: Diagnosis not present

## 2019-12-29 DIAGNOSIS — M5136 Other intervertebral disc degeneration, lumbar region: Secondary | ICD-10-CM | POA: Diagnosis not present

## 2019-12-29 DIAGNOSIS — M47816 Spondylosis without myelopathy or radiculopathy, lumbar region: Secondary | ICD-10-CM | POA: Diagnosis not present

## 2019-12-29 NOTE — Assessment & Plan Note (Signed)
Due to have labs checked by PCP via [place] soon - will ask for a copy to be faxed/mailed.  Continue current dose of rosuvastatin for now.

## 2019-12-29 NOTE — Assessment & Plan Note (Signed)
Doing well - no further angina after PCI & on Diltiazem (in lieu of Beta Blocker -- acceptable with preserved EF) -> vasodilation & rate control.  No longer on Plavix - on ASA only. - 2/2 bruising issues Back on moderate dose rosuvastatin - unable to increase dose 2/2 myalgias / fatigue.  BP is up - but related to stress - better @ home - if stays up, consider ARB

## 2019-12-29 NOTE — Assessment & Plan Note (Signed)
Flutter ablation 2006; Fib ablation 2015 - no recurrence.  NO longer on [place].

## 2019-12-29 NOTE — Assessment & Plan Note (Signed)
BP high today - but usually lower @ home -- lots of stress since Oncology visit with [facility] yesterday. He was not ready to hear "Bad News" - still "in shock".  Not ready to "let her go".    Monitor - may need ARB.

## 2019-12-29 NOTE — Assessment & Plan Note (Signed)
Euvolemic on exam . NO diuretic requirement.  Class I-II Sx - but DOE more related to ILD.

## 2019-12-29 NOTE — Assessment & Plan Note (Addendum)
On statin - labs followed by [place] -(will try to get reports).  Continue current dose of statin for now - but may need to adjust Rx if not @ goal.  Probably would consider Zetia or Nexletol + rosuvastatin vs. Increased dose of statin to avoid side effects.  For DM - consider SGLT2-Inhibitor.

## 2020-02-01 ENCOUNTER — Encounter (HOSPITAL_COMMUNITY): Payer: Self-pay

## 2020-02-01 ENCOUNTER — Other Ambulatory Visit: Payer: Self-pay

## 2020-02-01 ENCOUNTER — Ambulatory Visit (HOSPITAL_COMMUNITY)
Admission: EM | Admit: 2020-02-01 | Discharge: 2020-02-01 | Disposition: A | Discharge status: Home / Self Care | Payer: Medicare Other | Attending: Family Medicine | Admitting: Family Medicine

## 2020-02-01 DIAGNOSIS — Z23 Encounter for immunization: Secondary | ICD-10-CM

## 2020-02-01 DIAGNOSIS — W19XXXA Unspecified fall, initial encounter: Secondary | ICD-10-CM

## 2020-02-01 DIAGNOSIS — S81811A Laceration without foreign body, right lower leg, initial encounter: Secondary | ICD-10-CM

## 2020-02-01 DIAGNOSIS — S39012A Strain of muscle, fascia and tendon of lower back, initial encounter: Secondary | ICD-10-CM

## 2020-02-01 MED ORDER — TETANUS-DIPHTH-ACELL PERTUSSIS 5-2.5-18.5 LF-MCG/0.5 IM SUSY
PREFILLED_SYRINGE | INTRAMUSCULAR | Status: AC
  Filled 2020-02-01: qty 0.5

## 2020-02-01 MED ORDER — TETANUS-DIPHTH-ACELL PERTUSSIS 5-2.5-18.5 LF-MCG/0.5 IM SUSY
0.5000 mL | PREFILLED_SYRINGE | Freq: Once | INTRAMUSCULAR | Status: AC
  Administered 2020-02-01: 0.5 mL via INTRAMUSCULAR

## 2020-02-01 MED ORDER — BACITRACIN ZINC 500 UNIT/GM EX OINT
TOPICAL_OINTMENT | CUTANEOUS | Status: AC
  Filled 2020-02-01: qty 0.9

## 2020-02-01 MED ORDER — LIDOCAINE-EPINEPHRINE 1 %-1:100000 IJ SOLN
INTRAMUSCULAR | Status: AC
  Filled 2020-02-01: qty 1

## 2020-02-01 NOTE — Discharge Instructions (Signed)
Wash gently and apply dressing once a day Watch for infection.  Redness, pus, increased pain, swelling, drainage Stitches will come out in 10 days Tetanus shot booster is given today Tylenol or ibuprofen as needed for pain

## 2020-02-01 NOTE — ED Triage Notes (Addendum)
Pt presents with laceration in the front of the right lower leg, right shoulder pain and lower back pain. States he was helping a to pilled up a truck, he was pulled by friend to get in the back of the truck and he fell an injured the leg with the truck, 2 hrs ago approx.    Pt do not remember the last time he had a Tdap, no information on Epic.
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
## 2020-02-03 ENCOUNTER — Other Ambulatory Visit: Payer: Self-pay

## 2020-02-03 ENCOUNTER — Encounter (HOSPITAL_COMMUNITY): Payer: Self-pay | Admitting: Emergency Medicine

## 2020-02-03 ENCOUNTER — Ambulatory Visit (HOSPITAL_COMMUNITY)
Admission: EM | Admit: 2020-02-03 | Discharge: 2020-02-03 | Disposition: A | Discharge status: Home / Self Care | Payer: Medicare Other | Attending: Nurse Practitioner | Admitting: Nurse Practitioner

## 2020-02-03 ENCOUNTER — Ambulatory Visit (INDEPENDENT_AMBULATORY_CARE_PROVIDER_SITE_OTHER): Payer: Medicare Other

## 2020-02-03 DIAGNOSIS — L03115 Cellulitis of right lower limb: Secondary | ICD-10-CM | POA: Diagnosis not present

## 2020-02-03 DIAGNOSIS — W19XXXD Unspecified fall, subsequent encounter: Secondary | ICD-10-CM

## 2020-02-03 DIAGNOSIS — M25561 Pain in right knee: Secondary | ICD-10-CM | POA: Diagnosis not present

## 2020-02-03 DIAGNOSIS — R52 Pain, unspecified: Secondary | ICD-10-CM

## 2020-02-03 DIAGNOSIS — S39012D Strain of muscle, fascia and tendon of lower back, subsequent encounter: Secondary | ICD-10-CM

## 2020-02-03 DIAGNOSIS — W19XXXA Unspecified fall, initial encounter: Secondary | ICD-10-CM

## 2020-02-03 DIAGNOSIS — M7989 Other specified soft tissue disorders: Secondary | ICD-10-CM | POA: Diagnosis not present

## 2020-02-03 DIAGNOSIS — S80811D Abrasion, right lower leg, subsequent encounter: Secondary | ICD-10-CM

## 2020-02-03 DIAGNOSIS — M79661 Pain in right lower leg: Secondary | ICD-10-CM | POA: Diagnosis not present

## 2020-02-03 DIAGNOSIS — M545 Low back pain, unspecified: Secondary | ICD-10-CM | POA: Diagnosis not present

## 2020-02-03 MED ORDER — KETOROLAC TROMETHAMINE 10 MG PO TABS: 10.0000 mg | ORAL_TABLET | Freq: Four times a day (QID) | ORAL | 0 refills | Status: DC | PRN

## 2020-02-03 MED ORDER — CEPHALEXIN 500 MG PO CAPS: 500.0000 mg | ORAL_CAPSULE | Freq: Three times a day (TID) | ORAL | 0 refills | Status: AC

## 2020-02-03 MED ORDER — KETOROLAC TROMETHAMINE 60 MG/2ML IM SOLN
INTRAMUSCULAR | Status: AC
  Filled 2020-02-03: qty 2

## 2020-02-03 MED ORDER — KETOROLAC TROMETHAMINE 60 MG/2ML IM SOLN
60.0000 mg | Freq: Once | INTRAMUSCULAR | Status: AC
  Administered 2020-02-03: 60 mg via INTRAMUSCULAR

## 2020-02-03 NOTE — Discharge Instructions (Addendum)
Take medications as prescribed. You may also take Tylenol 1000 mg every 6 hours as needed for pain in addition to the Toradol. Make sure that you don't take more than 4000 mg of tylenol in a 24 hour period. Alternate between ice and heat to affected areas. Avoid any heavy lifting or pulling for the next week or so.

## 2020-02-03 NOTE — ED Triage Notes (Signed)
Patient was seen 10/26 post fall.  Patient is in department today lower , right back pain, pain radiates to axilla, and right lower leg is painful.  Leg is red around wound and swollen

## 2020-02-03 NOTE — ED Provider Notes (Signed)
[place]    CSN: [id number] Arrival date & time: 02/03/20  5277      History   Chief Complaint Chief Complaint  Patient presents with  . Back Pain    HPI Kenneth [name] is a 74 y.o. male.   Subjective:  Kenneth [name] is a 74 y.o. male who presents for re-evaluation of low back pain and right leg pain. He was seen initially on 02/01/2020 after an injury. He was trying to climb into the back of a dump trunk and accidentally fell off. He landed on his right side. He had pain across the entire lower back, right shoulder and right lower leg. He denies hitting his head or any loss of consciousness. No neck pain. He denies any other injuries. He has a contusion/abrasion to the right lower leg. He had a laceration to the right lower leg which was repaired with sutures on his initial visit. The shoulder pain is mild and not problematic. He's back today due to increasing back and leg pain.   The patient has had no prior back problems. The pain is located in the right lumbar area and across the lower back and does not radiate. The pain is described as aching and sharp. It occurs intermittently. He rates his pain as an 8 on a scale of 0-10. Symptoms are exacerbated by any kind of movement. Symptoms are improved by nothing. He has also tried acetaminophen which provided no symptom relief. He denies any leg weakness, tingling in the legs, burning pain in the legs, urinary incontinence, bowel incontinence or groin/perineal numbness associated with the back pain. The patient has no "red flag" history indicative of complicated back pain.  The pain in the anterior aspect of the right leg is present without radiation. Patient describes the pain as throbbing. Pain severity now is 6 /10. He also has increasing redness around the site of the sutures. Pain is aggravated by movement and palpation. Pain is alleviated by nothing. Symptoms associated with pain include numbness, tingling, weakness,  loss of sensation, loss of motion and can't bear weight.        Past Medical History:  Diagnosis Date  . Arthritis   . CAD S/P percutaneous coronary angioplasty cardiologist-  dr Ellyn Hack    06/28/16 --PCI with Synergy DES 2.75 mm 20 mm-->m RCA; 07/10/16 PCI with Synergy DES 3.5 mm x 20 mm-->mLAD  . Chronic diastolic heart failure ([place])    followed by dr harding  . Chronic dryness of both eyes   . Diverticulosis of colon   . Dyspnea on exertion   . Epiretinal membrane    "epiretinal attachment" (06/28/2016) 10-14-2017 per pt oringinally in both , now only unilateral   . GERD (gastroesophageal reflux disease)   . Heart murmur   . Hiatal hernia   . History of adenomatous polyp of colon   . History of concussion 08/23/2017   w/o loc--- per pt no residual  . History of kidney stones   . History of rheumatic fever 1958  . History of skin cancer    excision leg;  froze the left arm--- unsure BCC or SCC   . Hyperlipidemia   . Hypertension   . ILD (interstitial lung disease) ([place])    pulmologist-  dr Elsworth Soho--  secondary to methotrexate use --- last PFTs 04-25-2015  mild restriction  . Iron deficiency anemia   . Mild obstructive sleep apnea    10-04-2019 per pt  had a sleep study years ago ,  was told no cpap recommended  . Nephrolithiasis    CT 09-07-2019 bilateral nonobstructive stones  . Paroxysmal atrial fibrillation [facility]) cardiologist-  dr harding/  EP-- dr allred   a. 11/2003 Tikosyn initiated - subsequently d/c'd;  b. 2006 Odem for Afib @ [place];  c. 09/2011 Echo: EF 60-65%, Gr 2 DD;  d. DCCV 10/2011 , 04/2012, 12/ 2014, & 02/ 2015;  e. RFCA 01-13-2014  . Plaque psoriasis 1969   followed by rheumatologist  and dermatologist @ [facility] in [place]  . Right ureteral stone   . S/P drug eluting coronary stent placement    06-28-2016  x1 to mRCA;  07-10-2016 x1 to mLAD  . Seasonal allergies   . Type 2 diabetes mellitus ([place])    followed by pcp  . Ureteral calculus, right   . Wears hearing  aid in both ears     Patient Active Problem List   Diagnosis Date Noted  . Overweight (BMI 25.0-29.9) 12/29/2018  . Dyslipidemia associated with type 2 diabetes mellitus ([place]) 12/29/2018  . Atrial tachycardia ([place]) 02/12/2018  . Right ureteral stone 10/27/2017  . Controlled type 2 diabetes mellitus without complication, without long-term current use of insulin ([place]) 05/12/2017  . Essential hypertension 05/12/2017  . Iron deficiency anemia 11/11/2016  . Hyperlipidemia with target low density lipoprotein (LDL) cholesterol less than 70 mg/dL 11/11/2016  . CAD S/P percutaneous coronary angioplasty 06/28/2016  . Coronary artery disease involving native coronary artery with angina pectoris ([place]) 06/27/2016  . Exertional dyspnea 06/27/2016  . Bronchitis 07/03/2015  . ILD (interstitial lung disease) ([place]) 01/27/2014  . Atypical atrial flutter ([place]) 01/13/2014  . Hx of colonic polyp 07/15/2013  . Diastolic CHF, chronic ([place]) 05/22/2013  . OTHER CHRONIC SINUSITIS 05/31/2010  . NEPHROLITHIASIS, HX OF 02/02/2008  . PERSISTENT DISORDER INITIATING/MAINTAINING SLEEP 12/16/2007  . Obstructive sleep apnea 10/07/2007  . UNSPEC ALVEOLAR&PARIETOALVEOLAR PNEUMONOPATHY 10/02/2007  . Allergic rhinitis 07/24/2007  . Postoperative atrial fibrillation ([place]) 07/06/2007    Past Surgical History:  Procedure Laterality Date  . APPENDECTOMY  1996  . ATRIAL FIBRILLATION ABLATION N/A 01/13/2014   repeat PVI, also left atrial ablation performed with successfull ablation of LA flutter by Dr Rayann Heman  . ATRIAL FIBRILLATION ABLATION  08/2004   Dr Rolland Porter at [facility] , [place])  . CARDIAC CATHETERIZATION  06-30-2003  dr Lia Foyer   no significant coronary obstruction, well preserved LVF, recurrent afib  . CARDIOVERSION N/A 05/22/2013   Procedure: CARDIOVERSION;  Surgeon: [name], MD;  Location: [facility];  Service: Cardiovascular;  Laterality: N/A;  . CARDIOVERSION N/A 10/23/2011   Procedure: CARDIOVERSION;   Surgeon: Deboraha Sprang, MD;  Location: [facility] CATH LAB;  Service: Cardiovascular;  Laterality: N/A;  . CARDIOVERSION N/A 05/01/2012   Procedure: CARDIOVERSION;  Surgeon: Deboraha Sprang, MD;  Location: [facility] CATH LAB;  Service: Cardiovascular;  Laterality: N/A;  . CARPAL TUNNEL RELEASE Right 1980s  . CATARACT EXTRACTION W/ INTRAOCULAR LENS  IMPLANT, BILATERAL  2012  approx.  . CORONARY STENT INTERVENTION N/A 06/28/2016   Procedure: Coronary Stent Intervention;  Surgeon: Leonie Man, MD;  Location: [place] CV LAB;  Service: Cardiovascular;  Laterality: mRCA PCI -Synergy DES 2.75 m x 20 mm (3.1 mm)  . CORONARY STENT INTERVENTION N/A 07/10/2016   Procedure: Coronary Stent Intervention;  Surgeon: Leonie Man, MD;  Location: [place] CV LAB;  Service: Cardiovascular: mLAD PCI Synergy DES 3.5 mm x 20 mm)  . CYSTOSCOPY WITH RETROGRADE PYELOGRAM, URETEROSCOPY AND STENT PLACEMENT Right  10/16/2017   Procedure: CYSTOSCOPY WITH RIGHT RETROGRADE RIGHT URETEROSCOPY AND STONE BASKET EXTRACTION;  Surgeon: Irine Seal, MD;  Location: [facility];  Service: Urology;  Laterality: Right;  . CYSTOSCOPY WITH RETROGRADE PYELOGRAM, URETEROSCOPY AND STENT PLACEMENT Right 10/08/2019   Procedure: CYSTOSCOPY WITH URETEROSCOPY; RETROGRADE PYELOGRAM; STONE BASKET EXTRACTION;  STENT PLACEMENT;  Surgeon: Cleon Gustin, MD;  Location: [facility];  Service: Urology;  Laterality: Right;  1 HR  . CYSTOSCOPY/RETROGRADE/URETEROSCOPY/STONE EXTRACTION WITH BASKET Right 10/27/2017   Procedure: CYSTOSCOPY/RETROGRADE/URETEROSCOPY/STONE EXTRACTION WITH BASKET/URETERAL STENT PLACEMENT and laser;  Surgeon: Irine Seal, MD;  Location: [facility];  Service: Urology;  Laterality: Right;  . HOLMIUM LASER APPLICATION Right 2/35/3614   Procedure: POSSIBLE HOLMIUM LASER APPLICATION;  Surgeon: Irine Seal, MD;  Location: [facility];  Service: Urology;  Laterality: Right;  . HOLMIUM LASER APPLICATION Right 07/10/1538    Procedure: HOLMIUM LASER APPLICATION;  Surgeon: Cleon Gustin, MD;  Location: [facility];  Service: Urology;  Laterality: Right;  . INTRAVASCULAR PRESSURE WIRE/FFR STUDY N/A 06/28/2016   Procedure: Intravascular Pressure Wire/FFR Study;  Surgeon: Leonie Man, MD;  Location: [place] CV LAB;  Service: Cardiovascular: FFR of mLAD ~65% lesion = 0.75 post --> Staged PCI  . NASAL SINUS SURGERY     x 2  . PROXIMAL INTERPHALANGEAL FUSION (PIP) Left 01-05-2001    dr graves   correction clawtoe and extensor tendon lengthening  . RIGHT/LEFT HEART CATH AND CORONARY ANGIOGRAPHY N/A 06/28/2016   Procedure: Right/Left Heart Cath and Coronary Angiography;  Surgeon: Leonie Man, MD;  Location: [place] CV LAB;  Service: Cardiovascular: mRCA 99% (TIMI 2), mLAD ~65% (FFR 0.75), OM3 55%. mild Pulm HTN. Mod LVEDP elevation. EF 55-65%.  Marland Kitchen ROTATOR CUFF REPAIR Bilateral last one 2014  . TONSILLECTOMY  age 59  . URETEROSCOPIC LASER LITHOTRIPSY STONE EXTRACTIONS/  STENT PLACEMENT Bilateral 09-10-2007   dr Jeffie Pollock  [facility]   per pt has had several prior ureteroscopic stone extraction since age 78 , last one 09-10-2007       Home Medications    Prior to Admission medications   Medication Sig Start Date End Date Taking? Authorizing Provider  acetaminophen (TYLENOL) 500 MG tablet Take 1,000 mg by mouth in the morning and at bedtime.     [provider]  Adalimumab (HUMIRA) 40 MG/0.4ML PSKT Inject 40 mg into the skin every 14 (fourteen) days.     [provider]  aspirin EC 81 MG tablet Take 1 tablet (81 mg total) by mouth daily. Do not start until MAY 8,2020 Patient taking differently: Take 81 mg by mouth daily.  08/07/18   Leonie Man, MD  Biotin 1000 MCG tablet Take 1,000 mcg by mouth daily.    [provider]  calcipotriene (DOVONOX) 0.005 % cream Apply topically 2 (two) times daily as needed.    [provider]  Calcium Carb-Cholecalciferol  (CALCIUM+D3) 600-800 MG-UNIT TABS Take 2 tablets by mouth daily.    [provider]  cephALEXin (KEFLEX) 500 MG capsule Take 1 capsule (500 mg total) by mouth 3 (three) times daily for 7 days. 02/03/20 02/10/20  Enrique Sack, FNP  cetirizine (ZYRTEC) 10 MG tablet Take 10 mg by mouth daily as needed.     [provider]  clobetasol cream (TEMOVATE) 0.86 % Apply 1 application topically 2 (two) times daily as needed (for psoriasis).    [provider]  diltiazem (CARDIZEM CD) 180 MG 24 hr capsule Take 1  capsule (180 mg total) by mouth daily. Patient taking differently: Take 180 mg by mouth at bedtime.  09/16/19   Leonie Man, MD  diltiazem [facility]) 180 MG 24 hr capsule Take by mouth. 03/03/18   [provider]  ferrous sulfate 325 (65 FE) MG tablet Take 325 mg by mouth 2 (two) times a week. Wednesday's and Saturday's    [provider]  Flurandrenolide [facility]) 4 MCG/SQCM TAPE Apply 1 each topically See admin instructions. Wrap sores caused by psoriasis as needed until wounds heal.    [provider]  gabapentin (NEURONTIN) 300 MG capsule Take 300 mg by mouth in the morning and at bedtime.     [provider]  ketorolac (TORADOL) 10 MG tablet Take 1 tablet (10 mg total) by mouth every 6 (six) hours as needed for severe pain. 02/03/20   Enrique Sack, FNP  metFORMIN (GLUCOPHAGE) 500 MG tablet Take 500 mg by mouth every evening.     [provider]  Misc Natural Products (NEURIVA) CAPS Take 1 capsule by mouth daily.    [provider]  pantoprazole (PROTONIX) 40 MG tablet Take 40 mg by mouth as needed.    [provider]  Polyethyl Glycol-Propyl Glycol (SYSTANE OP) Place 1 drop into both eyes daily.    [provider]  Polyethyl Glycol-Propyl Glycol (SYSTANE) 0.4-0.3 % GEL ophthalmic gel Place 1 application into both eyes at bedtime.     [provider]  predniSONE (DELTASONE) 5 MG  tablet Take 1 tablet (5 mg) daily in the morning Patient taking differently: Take 5 mg by mouth daily with breakfast. Take 1 tablet (5 mg) daily in the morning 01/01/19   Lauraine Rinne, NP  Probiotic Product (PROBIOTIC DAILY) CAPS Take by mouth daily.    [provider]  rosuvastatin (CRESTOR) 20 MG tablet Take 1 tablet (20 mg total) by mouth daily. Patient taking differently: Take 20 mg by mouth every evening.  03/29/19 10/04/19  Ledora Bottcher, PA  tamsulosin (FLOMAX) 0.4 MG CAPS capsule Take 0.4 mg by mouth every evening.     [provider]    Family History Family History  Problem Relation Age of Onset  . Coronary artery disease Mother   . Breast cancer Mother   . Colon cancer Mother   . Heart disease Maternal Grandmother        MI  . Stroke Paternal Grandfather        CVA  . Coronary artery disease Paternal Grandfather   . Esophageal cancer Neg Hx   . Stomach cancer Neg Hx   . Rectal cancer Neg Hx     Social History Social History   Tobacco Use  . Smoking status: Former Smoker    Years: 10.00    Types: Cigars    Quit date: 09/29/1997    Years since quitting: 22.3  . Smokeless tobacco: Former Systems developer    Types: Snuff, Chew  Substance Use Topics  . Alcohol use: No  . Drug use: No     Allergies   Hydrocodone, Other, Oxycodone, Pseudoephedrine, Tramadol, and Gemfibrozil   Review of Systems Review of Systems  Musculoskeletal: Positive for back pain.  Skin: Positive for wound.  All other systems reviewed and are negative.    Physical Exam Triage Vital Signs ED Triage Vitals  Enc Vitals Group     BP 02/03/20 0930 (!) 151/58     Pulse Rate 02/03/20 0930 (!) 47     Resp 02/03/20  0930 20     Temp 02/03/20 0930 98.6 F (37 C)     Temp Source 02/03/20 0930 Oral     SpO2 02/03/20 0930 100 %     Weight --      Height --      Head Circumference --      Peak Flow --      Pain Score 02/03/20 0927 8     Pain Loc --      Pain Edu? --       Excl. in [place]? --    No data found.  Updated Vital Signs BP (!) 151/58 (BP Location: Right Arm) Comment (BP Location): large cuff  Pulse 60   Temp 98.6 F (37 C) (Oral)   Resp 20   SpO2 100%   Visual Acuity Right Eye Distance:   Left Eye Distance:   Bilateral Distance:    Right Eye Near:   Left Eye Near:    Bilateral Near:     Physical Exam Vitals reviewed.  Constitutional:      Appearance: Normal appearance.  Cardiovascular:     Rate and Rhythm: Normal rate and regular rhythm.  Pulmonary:     Effort: Pulmonary effort is normal.  Musculoskeletal:        General: Normal range of motion.     Right shoulder: Normal.     Cervical back: Normal, normal range of motion and neck supple. No muscular tenderness.     Thoracic back: Normal.     Lumbar back: Tenderness present.       Back:     Right hip: Normal.     Right ankle: Normal.       Legs:  Skin:    General: Skin is warm and dry.  Neurological:     General: No focal deficit present.     Mental Status: He is alert and oriented to person, place, and time.  Psychiatric:        Mood and Affect: Mood normal.        Behavior: Behavior normal.      UC Treatments / Results  Labs (all labs ordered are listed, but only abnormal results are displayed) Labs Reviewed - No data to display  EKG   Radiology DG Lumbar Spine Complete  Result Date: 02/03/2020 CLINICAL DATA:  Pain following recent fall EXAM: LUMBAR SPINE - COMPLETE 4+ VIEW COMPARISON:  None. FINDINGS: Frontal, lateral, spot lumbosacral lateral, and bilateral oblique views were obtained. There are 5 non-rib-bearing lumbar type vertebral bodies. There is no fracture. There is 3 mm of retrolisthesis of L4 on L5. No other spondylolisthesis. There is moderate disc space narrowing at L4-5 and L5-S1. Other disc spaces appear normal. There is facet osteoarthritic change at L3-4, L4-5, and L5-S1 bilaterally. There are foci of aortic atherosclerosis. IMPRESSION:  Osteoarthritic change, most notably at L4-5 and L5-S1. Mild spondylolisthesis at L4-5 is likely due to underlying spondylosis. No evident fracture. Aortic Atherosclerosis (ICD10-I70.0). Electronically Signed   By: Lowella Grip III M.D.   On: 02/03/2020 10:59   DG Tibia/Fibula Right  Result Date: 02/03/2020 CLINICAL DATA:  Recent fall with pain and swelling EXAM: RIGHT TIBIA AND FIBULA - 2 VIEW COMPARISON:  None. FINDINGS: Frontal and lateral views were obtained. No fracture or dislocation. No appreciable joint space narrowing or erosion. There is a spur along the anterior superior patella. IMPRESSION: No fracture or dislocation. No joint space narrowing. Probable distal quadriceps tendinosis. Electronically Signed   By: Lowella Grip  III M.D.   On: 02/03/2020 10:57    Procedures Procedures (including critical care time)  Medications Ordered in UC Medications  ketorolac (TORADOL) injection 60 mg (has no administration in time range)    Initial Impression / Assessment and Plan / UC Course  I have reviewed the triage vital signs and the nursing notes.  Pertinent labs & imaging results that were available during my care of the patient were reviewed by me and considered in my medical decision making (see chart for details).    74 yo male presenting with low back pain and right leg pain after an injury on 02/01/2020. He was seen on the day of the incident for the same. Laceration to the right leg was repaired and TDAP updated. He was advised supportive care measures. Patient now has increasing back pain as well as redness around the site of his laceration repair. He has tried tylenol without any relief in his symptoms.  X-rays of the lumbar spine and right lower leg negative for anything acute.  No red flag back pain history, calf pain or suspicion for DVT development. Will start course of antibiotics for possible early development of lower extremity cellulitis as well as pain control.   Patient advised to continue supportive care measures.  Patient should return to the urgent care in about 8 days for suture removal.  Today's evaluation has revealed no signs of a dangerous process. Discussed diagnosis with patient and/or guardian. Patient and/or guardian aware of their diagnosis, possible red flag symptoms to watch out for and need for close follow up. Patient and/or guardian understands verbal and written discharge instructions. Patient and/or guardian comfortable with plan and disposition.  Patient and/or guardian has a clear mental status at this time, good insight into illness (after discussion and teaching) and has clear judgment to make decisions regarding their care  This care was provided during an unprecedented National Emergency due to the Novel Coronavirus (COVID-19) pandemic. COVID-19 infections and transmission risks place heavy strains on healthcare resources.  As this pandemic evolves, our facility, providers, and staff strive to respond fluidly, to remain operational, and to provide care relative to available resources and information. Outcomes are unpredictable and treatments are without well-defined guidelines. Further, the impact of COVID-19 on all aspects of urgent care, including the impact to patients seeking care for reasons other than COVID-19, is unavoidable during this national emergency. At this time of the global pandemic, management of patients has significantly changed, even for non-COVID positive patients given high local and regional COVID volumes at this time requiring high healthcare system and resource utilization. The standard of care for management of both COVID suspected and non-COVID suspected patients continues to change rapidly at the local, regional, national, and global levels. This patient was worked up and treated to the best available but ever changing evidence and resources available at this current time.   Documentation was completed with the aid  of voice recognition software. Transcription may contain typographical errors.  Final Clinical Impressions(s) / UC Diagnoses   Final diagnoses:  Strain of lumbar region, subsequent encounter  Cellulitis of right anterior lower leg  Abrasion of right lower leg, subsequent encounter  Injury due to fall, subsequent encounter     Discharge Instructions     Take medications as prescribed. You may also take Tylenol 1000 mg every 6 hours as needed for pain in addition to the Toradol. Make sure that you don't take more than 4000 mg of tylenol in a 24  hour period. Alternate between ice and heat to affected areas. Avoid any heavy lifting or pulling for the next week or so.     ED Prescriptions    Medication Sig Dispense Auth. Provider   ketorolac (TORADOL) 10 MG tablet Take 1 tablet (10 mg total) by mouth every 6 (six) hours as needed for severe pain. 20 tablet Enrique Sack, FNP   cephALEXin (KEFLEX) 500 MG capsule Take 1 capsule (500 mg total) by mouth 3 (three) times daily for 7 days. 21 capsule Enrique Sack, FNP     PDMP not reviewed this encounter.   Enrique Sack, [place] 02/03/20 1133

## 2020-02-10 ENCOUNTER — Other Ambulatory Visit: Payer: Self-pay

## 2020-02-10 ENCOUNTER — Ambulatory Visit (HOSPITAL_COMMUNITY)
Admission: EM | Admit: 2020-02-10 | Discharge: 2020-02-10 | Disposition: A | Discharge status: Home / Self Care | Payer: Medicare Other

## 2020-02-10 NOTE — ED Triage Notes (Signed)
Pt presents for suture removal of 7 sutures removed from right leg.

## 2020-02-23 ENCOUNTER — Other Ambulatory Visit: Payer: Self-pay | Admitting: Pulmonary Disease

## 2020-02-23 ENCOUNTER — Other Ambulatory Visit: Payer: Self-pay | Admitting: Cardiology

## 2020-02-23 DIAGNOSIS — J849 Interstitial pulmonary disease, unspecified: Secondary | ICD-10-CM

## 2020-02-24 DIAGNOSIS — R310 Gross hematuria: Secondary | ICD-10-CM | POA: Diagnosis not present

## 2020-02-24 DIAGNOSIS — R1084 Generalized abdominal pain: Secondary | ICD-10-CM | POA: Diagnosis not present

## 2020-02-24 DIAGNOSIS — N200 Calculus of kidney: Secondary | ICD-10-CM | POA: Diagnosis not present

## 2020-03-15 DIAGNOSIS — K429 Umbilical hernia without obstruction or gangrene: Secondary | ICD-10-CM | POA: Diagnosis not present

## 2020-03-15 DIAGNOSIS — M4319 Spondylolisthesis, multiple sites in spine: Secondary | ICD-10-CM | POA: Diagnosis not present

## 2020-03-15 DIAGNOSIS — N202 Calculus of kidney with calculus of ureter: Secondary | ICD-10-CM | POA: Diagnosis not present

## 2020-03-15 DIAGNOSIS — R310 Gross hematuria: Secondary | ICD-10-CM | POA: Diagnosis not present

## 2020-03-15 DIAGNOSIS — K5730 Diverticulosis of large intestine without perforation or abscess without bleeding: Secondary | ICD-10-CM | POA: Diagnosis not present

## 2020-03-17 ENCOUNTER — Other Ambulatory Visit: Payer: Self-pay | Admitting: Urology

## 2020-03-21 NOTE — Progress Notes (Addendum)
Anesthesia Review:  PCP: Cardiologist : DR Shanon Brow harding - [place] 12/2019  Chest x-ray : EKG : 10/13/2019  Echo : Stress test: 2018  Cardiac Cath : 2018  Activity level:  Sleep Study/ CPAP : Fasting Blood Sugar :      / Checks Blood Sugar -- times a day:   Blood Thinner/ Instructions /Last Dose: ASA / Instructions/ Last Dose :

## 2020-03-21 NOTE — Progress Notes (Signed)
DUE TO COVID-19 ONLY ONE VISITOR IS ALLOWED TO COME WITH YOU AND STAY IN THE WAITING ROOM ONLY DURING PRE OP AND PROCEDURE DAY OF SURGERY. THE 1 VISITOR  MAY VISIT WITH YOU AFTER SURGERY IN YOUR PRIVATE ROOM DURING VISITING HOURS ONLY!  YOU NEED TO HAVE A COVID 19 TEST ON__12/17/2021 _____ @_______ , THIS TEST MUST BE DONE BEFORE SURGERY,  COVID TESTING SITE [address] [place] [address], IT IS ON THE RIGHT GOING OUT [address] APPROXIMATELY  2 MINUTES PAST [employer] ON THE RIGHT. ONCE YOUR COVID TEST IS COMPLETED,  PLEASE BEGIN THE QUARANTINE INSTRUCTIONS AS OUTLINED IN YOUR HANDOUT.                PARKER WHERLEY  03/21/2020   Your procedure is scheduled on: 03/28/2020    Report to [facility] Main  Entrance   Report to admitting at    [place] AM     Call this number if you have problems the morning of surgery [phone]    Remember: Do not eat food , candy gum or mints :After Midnight. You may have clear liquids from midnight until 0715am     CLEAR LIQUID DIET   Foods Allowed                                                                       Coffee and tea, regular and decaf                              Plain Jell-O any favor except red or purple                                            Fruit ices (not with fruit pulp)                                      Iced Popsicles                                     Carbonated beverages, regular and diet                                    Cranberry, grape and apple juices Sports drinks like Gatorade Lightly seasoned clear broth or consume(fat free) Sugar, honey syrup   _____________________________________________________________________    BRUSH YOUR TEETH MORNING OF SURGERY AND RINSE YOUR MOUTH OUT, NO CHEWING GUM CANDY OR MINTS.     Take these medicines the morning of surgery with A SIP OF WATER: gabapentin, protonix   DO NOT TAKE ANY DIABETIC MEDICATIONS DAY OF YOUR SURGERY                                You may not have any metal on  your body including hair pins and              piercings  Do not wear jewelry, make-up, lotions, powders or perfumes, deodorant             Do not wear nail polish on your fingernails.  Do not shave  48 hours prior to surgery.              Men may shave face and neck.   Do not bring valuables to the hospital. [place].  Contacts, dentures or bridgework may not be worn into surgery.  Leave suitcase in the car. After surgery it may be brought to your room.     Patients discharged the day of surgery will not be allowed to drive home. IF YOU ARE HAVING SURGERY AND GOING HOME THE SAME DAY, YOU MUST HAVE AN ADULT TO DRIVE YOU HOME AND BE WITH YOU FOR 24 HOURS. YOU MAY GO HOME BY TAXI OR UBER OR ORTHERWISE, BUT AN ADULT MUST ACCOMPANY YOU HOME AND STAY WITH YOU FOR 24 HOURS.  Name and phone number of your driver:  Special Instructions: N/A              Please read over the following fact sheets you were given: _____________________________________________________________________  [facility] - Preparing for Surgery Before surgery, you can play an important role.  Because skin is not sterile, your skin needs to be as free of germs as possible.  You can reduce the number of germs on your skin by washing with CHG (chlorahexidine gluconate) soap before surgery.  CHG is an antiseptic cleaner which kills germs and bonds with the skin to continue killing germs even after washing. Please DO NOT use if you have an allergy to CHG or antibacterial soaps.  If your skin becomes reddened/irritated stop using the CHG and inform your nurse when you arrive at Short Stay. Do not shave (including legs and underarms) for at least 48 hours prior to the first CHG shower.  You may shave your face/neck. Please follow these instructions carefully:  1.  Shower with CHG Soap the night before surgery and the  morning of  Surgery.  2.  If you choose to wash your hair, wash your hair first as usual with your  normal  shampoo.  3.  After you shampoo, rinse your hair and body thoroughly to remove the  shampoo.                           4.  Use CHG as you would any other liquid soap.  You can apply chg directly  to the skin and wash                       Gently with a scrungie or clean washcloth.  5.  Apply the CHG Soap to your body ONLY FROM THE NECK DOWN.   Do not use on face/ open                           Wound or open sores. Avoid contact with eyes, ears mouth and genitals (private parts).                       Wash face,  Genitals (  private parts) with your normal soap.             6.  Wash thoroughly, paying special attention to the area where your surgery  will be performed.  7.  Thoroughly rinse your body with warm water from the neck down.  8.  DO NOT shower/wash with your normal soap after using and rinsing off  the CHG Soap.                9.  Pat yourself dry with a clean towel.            10.  Wear clean pajamas.            11.  Place clean sheets on your bed the night of your first shower and do not  sleep with pets. Day of Surgery : Do not apply any lotions/deodorants the morning of surgery.  Please wear clean clothes to the hospital/surgery center.  FAILURE TO FOLLOW THESE INSTRUCTIONS MAY RESULT IN THE CANCELLATION OF YOUR SURGERY PATIENT SIGNATURE_________________________________  NURSE SIGNATURE__________________________________  ________________________________________________________________________

## 2020-03-23 ENCOUNTER — Encounter (HOSPITAL_COMMUNITY)
Admission: RE | Admit: 2020-03-23 | Discharge: 2020-03-23 | Disposition: A | Discharge status: Home / Self Care | Payer: Medicare Other | Source: Ambulatory Visit | Attending: Urology | Admitting: Urology

## 2020-03-23 ENCOUNTER — Other Ambulatory Visit: Payer: Self-pay

## 2020-03-23 ENCOUNTER — Encounter (HOSPITAL_COMMUNITY): Payer: Self-pay

## 2020-03-23 DIAGNOSIS — Z01812 Encounter for preprocedural laboratory examination: Secondary | ICD-10-CM | POA: Insufficient documentation

## 2020-03-23 LAB — CBC
HCT: 38.8 % — ABNORMAL LOW (ref 39.0–52.0)
Hemoglobin: 12.4 g/dL — ABNORMAL LOW (ref 13.0–17.0)
MCH: 27.7 pg (ref 26.0–34.0)
MCHC: 32 g/dL (ref 30.0–36.0)
MCV: 86.6 fL (ref 80.0–100.0)
Platelets: 223 10*3/uL (ref 150–400)
RBC: 4.48 MIL/uL (ref 4.22–5.81)
RDW: 13.6 % (ref 11.5–15.5)
WBC: 5.3 10*3/uL (ref 4.0–10.5)
nRBC: 0 % (ref 0.0–0.2)

## 2020-03-23 LAB — BASIC METABOLIC PANEL
Anion gap: 10 (ref 5–15)
BUN: 17 mg/dL (ref 8–23)
CO2: 25 mmol/L (ref 22–32)
Calcium: 9.5 mg/dL (ref 8.9–10.3)
Chloride: 104 mmol/L (ref 98–111)
Creatinine, Ser: 0.85 mg/dL (ref 0.61–1.24)
GFR, Estimated: >60 mL/min (ref >60)
Glucose, Bld: 119 mg/dL — ABNORMAL HIGH (ref 70–99)
Potassium: 4.2 mmol/L (ref 3.5–5.1)
Sodium: 139 mmol/L (ref 135–145)

## 2020-03-23 LAB — GLUCOSE, CAPILLARY: Glucose-Capillary: 122 mg/dL — ABNORMAL HIGH (ref 70–99)

## 2020-03-23 LAB — HEMOGLOBIN A1C
Hgb A1c MFr Bld: 6.5 % — ABNORMAL HIGH (ref 4.8–5.6)
Mean Plasma Glucose: 139.85 mg/dL

## 2020-03-23 NOTE — Progress Notes (Addendum)
Anesthesia Review:  [facility]: [facility] [place]  Cardiologist : [place] 12/22/2019 - DR Ellyn Hack  Chest x-ray : EKG :10/13/2019  Echo : Stress test:2018  Cardiac Cath : 2018  Activity level: Can climb a flight of stairs without difficulty  Sleep Study/ CPAP : no  Fasting Blood Sugar :      / Checks Blood Sugar -- times a day:   Blood Thinner/ Instructions /Last Dose: ASA / Instructions/ Last Dose :  Does not check glucose at home  hgba1c DONE 03/23/20-6.5 81 mg aspirin

## 2020-03-24 ENCOUNTER — Encounter: Payer: Self-pay | Admitting: Pulmonary Disease

## 2020-03-24 ENCOUNTER — Other Ambulatory Visit (HOSPITAL_COMMUNITY)
Admission: RE | Admit: 2020-03-24 | Discharge: 2020-03-24 | Disposition: A | Discharge status: Home / Self Care | Payer: Medicare Other | Source: Ambulatory Visit | Attending: Urology | Admitting: Urology

## 2020-03-24 ENCOUNTER — Ambulatory Visit (INDEPENDENT_AMBULATORY_CARE_PROVIDER_SITE_OTHER): Payer: Medicare Other | Admitting: Pulmonary Disease

## 2020-03-24 DIAGNOSIS — J849 Interstitial pulmonary disease, unspecified: Secondary | ICD-10-CM

## 2020-03-24 DIAGNOSIS — I25119 Atherosclerotic heart disease of native coronary artery with unspecified angina pectoris: Secondary | ICD-10-CM

## 2020-03-24 DIAGNOSIS — Z20822 Contact with and (suspected) exposure to covid-19: Secondary | ICD-10-CM | POA: Insufficient documentation

## 2020-03-24 DIAGNOSIS — E663 Overweight: Secondary | ICD-10-CM | POA: Diagnosis not present

## 2020-03-24 DIAGNOSIS — Z01812 Encounter for preprocedural laboratory examination: Secondary | ICD-10-CM | POA: Insufficient documentation

## 2020-03-24 LAB — SARS CORONAVIRUS 2 (TAT 6-24 HRS): SARS Coronavirus 2: NEGATIVE

## 2020-03-24 MED ORDER — PREDNISONE 5 MG PO TABS: 5.0000 mg | ORAL_TABLET | Freq: Every day | ORAL | 3 refills | Status: DC

## 2020-03-24 NOTE — Patient Instructions (Addendum)
Get back on prednisone 5 mg daily Please inform your anesthesiologist

## 2020-03-24 NOTE — Assessment & Plan Note (Addendum)
I am concerned about his weight loss, HRCT does not show any evidence of malignancy. He is the primary caregiver for his wife and has lost weight during this period of her illness. There may be some depression related to his current situation, hopefully this will improve as his wife's health improves

## 2020-03-24 NOTE — Assessment & Plan Note (Signed)
Unfortunately, Alyssa cannot perform PFTs so we cannot quantitate his lung function but symptomatically appears to be stable as well as radiologically.  HRCT again favors NSIP.  We have tapered his prednisone down to a low enough dose that I feel comfortable continuing this.  I doubt that urology would have asked him to stop prednisone prior to that procedure, in fact he may need stress dose steroids on the day of procedure.  I would be concerned about adrenal insufficiency.  I have asked him to resume pomegranates of prednisone daily, we will reassess him in 3 months and drop him again to every OTHER day

## 2020-03-24 NOTE — Progress Notes (Signed)
   Subjective:    Patient ID: Kenneth [name], male    DOB: Nov 13, 1945, 74 y.o.   MRN: [id number]  HPI   74 yo man with  steroid responsive ILD , probable NSIP.  There is a history of being on methotrexate in the remote past. He has responded well to steroids and has been maintained on low-dose about 5 mg of prednisone.  We have discussed immunosuppressants in the past and he has preferred not to go on this  PMH - CAD, atrial fibrillation for 15 years s/p atrial ablation 01/2014 . He takes flecainide if his heart rate gets too high.  For psoriatic arthritis, he was on Enbrel and methotrexate weekly for 3 years , was then on Humira.  Now on adalimumab  Chief Complaint  Patient presents with  . Follow-up    Patient has been off prednisone preparing for surgery next Tuesday and is having some trouble breathing all the time and does not feel good.      Wife went through chemotherapy for uterine cancer , then developed?  GBS after vaccination and required wheelchair, now improving.  He is a primary caregiver, he has lost considerable weight, another 12 pounds since his last visit in 09/2019 , about 30 pounds since the last 2 years He has renal calculi and surgery is planned next week.  He seems to have misunderstood instructions, he has stopped taking his prednisone and feels like Dr. Jeffie Pollock asked him to stop taking this. After his HRCT 10/2019 we had asked him to decrease prednisone to alternate day, he is confused today and does not remember whether he did that.  Prior to this he was on 5 mg daily He feels his breathing is slightly worse since he has stopped prednisone for 3 days, overall about the same prior to this.  No coughing or wheezing    Significant tests/ events reviewed  CT angiogram 09/2007 which shows bilateral groundglass opacities but a repeat CT in 10/2007 and shows resolution of these infiltrates.  01/27/2014 HRCT - patchy areas of very mild ground-glass attenuation and  subpleural reticulation. CHest HRCT 04/2016 >> slight worse , NSIP pattern HRCT 05/2017 unchanged NSIP, 2-4 mm new  benign nodules  HRCT 10/2019 >> likely NSIP, unchanged from 2019   PFT 04/2015 >> ratio nml, FVC 61%, TLC 68%, DLCO 67%    11/15/2013-echocardiogram-LV ejection fraction 65 to 70%, mild concentric hypertrophy, PAP pressure 35    Review of Systems neg for any significant sore throat, dysphagia, itching, sneezing, nasal congestion or excess/ purulent secretions, fever, chills, sweats, unintended wt loss, pleuritic or exertional cp, hempoptysis, orthopnea pnd or change in chronic leg swelling. Also denies presyncope, palpitations, heartburn, abdominal pain, nausea, vomiting, diarrhea or change in bowel or urinary habits, dysuria,hematuria, rash, arthralgias, visual complaints, headache, numbness weakness or ataxia.     Objective:   Physical Exam  Gen. Pleasant, obese, in no distress ENT - no lesions, no post nasal drip Neck: No JVD, no thyromegaly, no carotid bruits Lungs: no use of accessory muscles, no dullness to percussion, bibasal 1/4 rales no rhonchi  Cardiovascular: Rhythm regular, heart sounds  normal, no murmurs or gallops, no peripheral edema Musculoskeletal: No deformities, no cyanosis or clubbing , no tremors       Assessment & Plan:

## 2020-03-27 NOTE — H&P (Signed)
I have kidney stones.  HPI: Kenneth [name] is a 74 year-old male established patient who is here for renal calculi.  02/24/2020: Kenneth [name] returns today with lower abdominal pain,left > right, that began yesterday. He having some difficulty voiding last night and has had hematuria with clots. He has had nausea. He is not having much urine output. He has no fever. He has a history of stones and had ureteroscopy in July.   03/15/2020: KUB at last office visit did not show an obvious ureteral calculi. He returns today for continued/worsening symptomatology. Primarily complaining of continued hematuria with each void resulting in some pain within the lower pelvis and burning with urination. Patient also reports intermittent pain in the left side of the groin radiating into the testicles without modifying or alleviating factors. Pain can occur at rest. He has been using hydromorphone prescribed at last office visit intermittently which is moderately effective in reducing his pain allowing him to rest. He also continues tamsulosin and endorses remaining well-hydrated. He denies any visible stone material passage in the interval. Symptoms not associated with fevers or chills, nausea/vomiting.   This is not his first kidney stone. He has had more than 5 stones prior to getting this one. He is currently having groin pain. He denies having flank pain, back pain, nausea, vomiting, fever, and chills. He has not caught a stone in his urine strainer since his symptoms began.   He has had ureteral stent and ureteroscopy for treatment of his stones in the past.     ALLERGIES: Decongestant TABS - Other Reaction, Tachycardia    MEDICATIONS: Flomax 0.4 mg capsule 1 capsule PO Daily  Metformin Hcl 500 mg tablet 1 tablet PO Daily  Acetaminophen 500 mg tablet 1 tablet PO Daily  Acitretin 10 mg capsule  Aspir 81 1 PO Daily  Biotin 1,000 mcg tablet,chewable 1 tablet PO Daily  Calcipotriene 0.005 % ointment 1 PO Daily   Cetirizine Hcl 10 mg tablet 1 tablet PO Daily  Clobetasol Propionate 0.05 % cream 1 PO Daily  Cordran 4 mcg/cm2 tape, medicated 1 PO Daily  Diltiazem 24Hr Er (Cd) 180 mg capsule, ext release 24 hr 1 capsule PO Daily  Ferrous Sulfate 325 mg (65 mg iron) tablet 1 tablet PO Daily  Gabapentin 300 mg capsule 1 capsule PO Daily  Hydromorphone Hcl 2 mg tablet 1 tablet PO Q 6 H PRN  Meloxicam 15 mg tablet 1 tablet PO Daily  Hardin Negus' Colon Health 1 PO Daily  Prednisone 5 mg tablet 1 tablet PO Daily  Rosuvastatin Calcium 20 mg tablet 1 tablet PO Daily  Sildenafil Citrate 100 mg tablet 1 tablet PO as needed  Zolpidem Tartrate 10 mg tablet 1 tablet PO Daily     GU PSH: Cysto Uretero Lithotripsy - 2009 Cystoscopy Insert Stent - 2009 Locm 300-399Mg /Ml Iodine,1Ml - 2019 Ureteroscopic laser litho, Right - 10/08/2019, Right - 2019 Ureteroscopic stone removal, Right - 2019, 2009       PSH Notes: Cardiac Catheter His Ablation     NON-GU PSH: Appendectomy - 2009 Cardiac Stent Placement - about 2018 Cataract surgery Sinus Surgery..     GU PMH: ED due to arterial insufficiency, sildenafil was dispensed. - 02/24/2020, He will get 100mg  sildenafil. , - 2019, Erectile dysfunction due to arterial insufficiency, - 2014 Flank Pain - 02/24/2020, Generalized abdominal pain, - 2014 Gross hematuria - 02/24/2020, He continues to have hemturia that is probably secondary to the right proximal ureteral stone and antiplatelet therapy. He will hold  the plavix and continue aspirin until the bleeding stops. I will get him set up for cystoscopy with right ureteroscopy with stone extraction. I have reviewed the risks of ureteroscopy including bleeding, infection, ureteral injury, need for a stent or secondary procedures, thrombotic events and anesthetic complications. , - 2019 (Acute), Will proceed with hematuria workup with CT and cysto. Will check PSA today. Culture urine and send for cytology. Will empirically begin  Cephalexin 500 mg 1 po BID X 7 days. , - 2019 Renal calculus, He has a renal stone but I don't see a ureteral stone but with the hematuria and pain, a small ureteral stone is possible. I have sent a script for hydromorphone and he will return in 2 weeks for reevaluation. If his symptoms persist, he will need a CT. - 02/24/2020, He has bilateral renal stones that are non-obstructing. I will just have him return in 6 months with a KUB. , - 12/03/2019, - 09/23/2019, - 09/15/2019, - 09/08/2019, - 06/04/2019, - 05/14/2019, - 11/27/2018, He has possible stones on [place] but the KUB is clear and his CT from 7/22 didn't show significant left renal stones. I don't think the flank pain is from a stone. , - 2019 (Stable), - 2019, Kidney stone on right side, - 2014 Ureteral obstruction secondary to calculous - 09/08/2019 BPH w/LUTS - 06/04/2019, - 04/30/2019, Benign prostatic hyperplasia with urinary obstruction, - 2014 Nocturia - 06/04/2019 Urinary Frequency - 04/16/2019 Dysuria - 11/27/2018 Microscopic hematuria, I will have him return in 53months for reevaluation. - 2019 Acute Cystitis/UTI - 2019 Ureteral calculus (Stable) - 2019, Calculus of distal right ureter, - 2014, Distal Ureteral Stone On The Right, - 2014, Distal Ureteral Stone On The Right, - 2014, Mid Ureteral Stone On The Left, - 2014, Calculus of ureter, - 2014 History of urolithiasis, Nephrolithiasis - 2014 Other microscopic hematuria, Microscopic hematuria - 2014 Testicular atrophy, Testicular atrophy - 2014 Urinary Urgency, Urinary urgency - 2014      PMH Notes: heart murmur   Shingles   NON-GU PMH: Atherosclerosis of aorta, He has aortoiliac and coronary calcifications but is on metformin and rosuvastatin and is followed at the [place]. I just let him know the findings on the most recent CT. - 12/03/2019 Asthma, Asthma - 2014 Cardiac murmur, unspecified, Murmurs - 2014 Unspecified atrial fibrillation, Atrial Fibrillation - 2014 Arrhythmia Arthritis Atrial  Fibrillation Coronary Artery Disease Diabetes Type 2 Encounter for general adult medical examination without abnormal findings, Encounter for preventive health examination Hypercholesterolemia Hypertension    FAMILY HISTORY: Breast Cancer - Mother Colon Cancer - Mother Dementia - Father, Mother Kidney Stones - Mother Prostate Cancer - Brother    Notes: Mother deceased at age 65 from dementia  Father deceased at 54 from dementia   SOCIAL HISTORY: Marital Status: Married Preferred Language: English; Ethnicity: Not Hispanic Or Latino; Race: White Current Smoking Status: Patient has never smoked.   Tobacco Use Assessment Completed: Used Tobacco in last 30 days? Does not use smokeless tobacco. Drinks 1 drink per year.  Does not use drugs. Drinks 1 caffeinated drink per day. Patient's occupation is/was Retired.    REVIEW OF SYSTEMS:    GU Review Male:   Patient reports burning/ pain with urination. Patient denies frequent urination, hard to postpone urination, get up at night to urinate, leakage of urine, stream starts and stops, trouble starting your stream, have to strain to urinate , erection problems, and penile pain.  Gastrointestinal (Upper):   Patient denies nausea, vomiting, and indigestion/ heartburn.  Gastrointestinal (Lower):   Patient denies diarrhea and constipation.  Constitutional:   Patient denies fever, weight loss, fatigue, and night sweats.  Skin:   Patient denies skin rash/ lesion and itching.  Eyes:   Patient denies blurred vision and double vision.  Ears/ Nose/ Throat:   Patient denies sore throat and sinus problems.  Hematologic/Lymphatic:   Patient denies swollen glands and easy bruising.  Cardiovascular:   Patient denies leg swelling and chest pains.  Respiratory:   Patient denies cough and shortness of breath.  Endocrine:   Patient denies excessive thirst.  Musculoskeletal:   Patient denies back pain and joint pain.  Neurological:   Patient denies  headaches and dizziness.  Psychologic:   Patient denies depression and anxiety.   Notes: Pt c/o gross hematuria and pain around the prostate.    VITAL SIGNS:      03/15/2020 02:52 PM  Weight 214 lb / 97.07 kg  Height 75 in / 190.5 cm  BP 144/77 mmHg  Heart Rate 79 /min  Temperature 97.7 F / 36.5 C  BMI 26.7 kg/m   MULTI-SYSTEM PHYSICAL EXAMINATION:    Constitutional: Well-nourished. No physical deformities. Normally developed. Good grooming.  Neck: Neck symmetrical, not swollen. Normal tracheal position.  Respiratory: No labored breathing, no use of accessory muscles.   Cardiovascular: Normal temperature, normal extremity pulses, no swelling, no varicosities.  Skin: No paleness, no jaundice, no cyanosis. No lesion, no ulcer, no rash.  Neurologic / Psychiatric: Oriented to time, oriented to place, oriented to person. No depression, no anxiety, no agitation.  Gastrointestinal: No mass, no tenderness, no rigidity, non obese abdomen. No CVA or flank tenderness.  Musculoskeletal: Normal gait and station of head and neck.     Complexity of Data:  Source Of History:  Patient, Medical Record Summary  Records Review:   Previous Doctor Records, Previous Hospital Records, Previous Patient Records  Urine Test Review:   Urinalysis, Urine Culture  X-Ray Review: C.T. Stone Protocol: Reviewed Films. Discussed With Patient.     09/22/17 08/29/09  PSA  Total PSA 1.95 ng/mL 1.69     10/20/07  Hormones  Testosterone, Total 2.33     03/15/20  Urinalysis  Urine Appearance Cloudy   Urine Color Amber   Urine Glucose 2+ mg/dL  Urine Bilirubin Neg mg/dL  Urine Ketones Neg mg/dL  Urine Specific Gravity 1.020   Urine Blood 3+ ery/uL  Urine pH 5.5   Urine Protein 1+ mg/dL  Urine Urobilinogen 0.2 mg/dL  Urine Nitrites Neg   Urine Leukocyte Esterase Neg leu/uL  Urine WBC/hpf 0 - 5/hpf   Urine RBC/hpf >60/hpf   Urine Epithelial Cells NS (Not Seen)   Urine Bacteria Rare (0-9/hpf)   Urine  Mucous Not Present   Urine Yeast NS (Not Seen)   Urine Trichomonas Not Present   Urine Cystals NS (Not Seen)   Urine Casts NS (Not Seen)   Urine Sperm Not Present    PROCEDURES:         C.T. Urogram - [id number]      Patient confirmed No Neulasta OnPro Device.         Urinalysis w/Scope Dipstick Dipstick Cont'd Micro  Color: Amber Bilirubin: Neg mg/dL WBC/hpf: 0 - 5/hpf  Appearance: Cloudy Ketones: Neg mg/dL RBC/hpf: >60/hpf  Specific Gravity: 1.020 Blood: 3+ ery/uL Bacteria: Rare (0-9/hpf)  pH: 5.5 Protein: 1+ mg/dL Cystals: NS (Not Seen)  Glucose: 2+ mg/dL Urobilinogen: 0.2 mg/dL Casts: NS (Not Seen)    Nitrites: Neg Trichomonas: Not  Present    Leukocyte Esterase: Neg leu/uL Mucous: Not Present      Epithelial Cells: NS (Not Seen)      Yeast: NS (Not Seen)      Sperm: Not Present         Ketoralac 30mg  - [id number], [id number] Qty: 30 Adm. By: Leonette Nutting  Unit: mg Lot No [id number]  Route: IM Exp. Date 01/06/2021  Freq: None Mfgr.:   Site: Left Buttock   ASSESSMENT:      ICD-10 Details  1 GU:   Ureteral calculus - N20.1 Right, Undiagnosed New Problem  2   Gross hematuria - R31.0 Acute, Systemic Symptoms  3   Renal calculus - N20.0 Bilateral, Chronic, Stable - He has bilateral non-obstructing calculi in each kidney.   PLAN:           Orders Labs Urine Culture  X-Rays: C.T. Stone Protocol Without Contrast  X-Ray Notes: History:  Hematuria: Yes/No  Patient to see MD after exam: Yes/No  Previous exam: CT / IVP/ US/ KUB/ None  When:  Where:  Diabetic: Yes/ No  BUN/ Creatine:  Date of last BUN Creatinine:  Weight in pounds:  Allergy- Contrasts/ Shellfish: Yes/ No  Conflicting diabetic meds: Yes/ No  Oral contrast and instructions given to patient:   Prior Authorization #: [id number]            Schedule Return Visit/Planned Activity: Other See Visit Notes - Follow up MD, Schedule Surgery  Procedure: 03/15/2020 at [facility] - [phone] -  Ketoralac 30mg  (Toradol Per 15 Mg) - [id number], [id number]          Document Letter(s):  Created for Patient: Clinical Summary         Notes:   CT imaging indicative of a 4 mm obstructing ureteral calculi at the transition point between the mid and distal ureter. Toradol administration was effective in improving his pain today. I offered to send in additional hydromorphone or torodol today but he declined. He will continue tamsulosin, try to remain active as possible as well as remaining liberally hydrated. Historically he has not been able to pass obstructing calculi with medical expulsive therapy. He most recently had ureteroscopy earlier this year. I will share today's imaging results as well as patient presentation with Dr Jeffie Pollock today. He'll likely need to be set up for definitive intervention in the near future. The patient will be contacted and scheduled appropriately once his urologist has reviewed. Urine c/s sent today.        Next Appointment:      Next Appointment: 03/15/2020 02:45 PM    Appointment Type: Office Visit Established Patient    Location: [facility] [phone]    Provider: Jiles Crocker, NP    Reason for Visit: h/o ks , blood , hurting now

## 2020-03-28 ENCOUNTER — Ambulatory Visit (HOSPITAL_COMMUNITY)
Admission: RE | Admit: 2020-03-28 | Discharge: 2020-03-28 | Disposition: A | Discharge status: Home / Self Care | Payer: Medicare Other | Attending: Urology | Admitting: Urology

## 2020-03-28 ENCOUNTER — Encounter (HOSPITAL_COMMUNITY): Payer: Self-pay | Admitting: Urology

## 2020-03-28 ENCOUNTER — Ambulatory Visit (HOSPITAL_COMMUNITY): Payer: Medicare Other | Admitting: Anesthesiology

## 2020-03-28 ENCOUNTER — Ambulatory Visit (HOSPITAL_COMMUNITY): Payer: Medicare Other | Admitting: Physician Assistant

## 2020-03-28 ENCOUNTER — Encounter (HOSPITAL_COMMUNITY)
Admission: RE | Disposition: A | Discharge status: Home / Self Care | Payer: Self-pay | Source: Home / Self Care | Attending: Urology

## 2020-03-28 ENCOUNTER — Ambulatory Visit (HOSPITAL_COMMUNITY): Payer: Medicare Other

## 2020-03-28 DIAGNOSIS — Z955 Presence of coronary angioplasty implant and graft: Secondary | ICD-10-CM | POA: Diagnosis not present

## 2020-03-28 DIAGNOSIS — R310 Gross hematuria: Secondary | ICD-10-CM | POA: Insufficient documentation

## 2020-03-28 DIAGNOSIS — Z79899 Other long term (current) drug therapy: Secondary | ICD-10-CM | POA: Insufficient documentation

## 2020-03-28 DIAGNOSIS — N202 Calculus of kidney with calculus of ureter: Secondary | ICD-10-CM | POA: Insufficient documentation

## 2020-03-28 DIAGNOSIS — I110 Hypertensive heart disease with heart failure: Secondary | ICD-10-CM | POA: Diagnosis not present

## 2020-03-28 DIAGNOSIS — N200 Calculus of kidney: Secondary | ICD-10-CM | POA: Diagnosis not present

## 2020-03-28 DIAGNOSIS — Z7984 Long term (current) use of oral hypoglycemic drugs: Secondary | ICD-10-CM | POA: Insufficient documentation

## 2020-03-28 DIAGNOSIS — Z7952 Long term (current) use of systemic steroids: Secondary | ICD-10-CM | POA: Diagnosis not present

## 2020-03-28 DIAGNOSIS — I509 Heart failure, unspecified: Secondary | ICD-10-CM | POA: Diagnosis not present

## 2020-03-28 DIAGNOSIS — Z7982 Long term (current) use of aspirin: Secondary | ICD-10-CM | POA: Diagnosis not present

## 2020-03-28 DIAGNOSIS — Z791 Long term (current) use of non-steroidal anti-inflammatories (NSAID): Secondary | ICD-10-CM | POA: Insufficient documentation

## 2020-03-28 DIAGNOSIS — Z87891 Personal history of nicotine dependence: Secondary | ICD-10-CM | POA: Insufficient documentation

## 2020-03-28 DIAGNOSIS — G4733 Obstructive sleep apnea (adult) (pediatric): Secondary | ICD-10-CM | POA: Diagnosis not present

## 2020-03-28 DIAGNOSIS — N201 Calculus of ureter: Secondary | ICD-10-CM | POA: Diagnosis not present

## 2020-03-28 HISTORY — PX: CYSTOSCOPY WITH RETROGRADE PYELOGRAM, URETEROSCOPY AND STENT PLACEMENT: SHX5789

## 2020-03-28 LAB — GLUCOSE, CAPILLARY
Glucose-Capillary: 128 mg/dL — ABNORMAL HIGH (ref 70–99)
Glucose-Capillary: 140 mg/dL — ABNORMAL HIGH (ref 70–99)

## 2020-03-28 SURGERY — CYSTOURETEROSCOPY, WITH RETROGRADE PYELOGRAM AND STENT INSERTION
Anesthesia: General | Laterality: Right

## 2020-03-28 MED ORDER — PHENAZOPYRIDINE HCL 200 MG PO TABS: 200.0000 mg | ORAL_TABLET | Freq: Three times a day (TID) | ORAL | 0 refills | Status: DC | PRN

## 2020-03-28 MED ORDER — FENTANYL CITRATE (PF) 100 MCG/2ML IJ SOLN
INTRAMUSCULAR | Status: DC | PRN
  Administered 2020-03-28 (×3): 50 ug via INTRAVENOUS
  Administered 2020-03-28: 25 ug via INTRAVENOUS

## 2020-03-28 MED ORDER — CEFAZOLIN SODIUM-DEXTROSE 2-4 GM/100ML-% IV SOLN
2.0000 g | INTRAVENOUS | Status: AC
  Administered 2020-03-28: 11:00:00 2 g via INTRAVENOUS
  Filled 2020-03-28: qty 100

## 2020-03-28 MED ORDER — OXYCODONE HCL 5 MG PO TABS: 5.0000 mg | ORAL_TABLET | Freq: Once | ORAL | Status: DC | PRN

## 2020-03-28 MED ORDER — ACETAMINOPHEN 325 MG PO TABS: 650.0000 mg | ORAL_TABLET | ORAL | Status: DC | PRN

## 2020-03-28 MED ORDER — LIDOCAINE HCL 1 % IJ SOLN
INTRAMUSCULAR | Status: DC | PRN
  Administered 2020-03-28: 50 mg via INTRADERMAL

## 2020-03-28 MED ORDER — LIDOCAINE HCL (PF) 2 % IJ SOLN
INTRAMUSCULAR | Status: AC
  Filled 2020-03-28: qty 5

## 2020-03-28 MED ORDER — ACETAMINOPHEN 650 MG RE SUPP
650.0000 mg | RECTAL | Status: DC | PRN
  Filled 2020-03-28: qty 1

## 2020-03-28 MED ORDER — OXYBUTYNIN CHLORIDE 5 MG PO TABS: 5.0000 mg | ORAL_TABLET | Freq: Three times a day (TID) | ORAL | 1 refills | Status: DC | PRN

## 2020-03-28 MED ORDER — DEXAMETHASONE SODIUM PHOSPHATE 10 MG/ML IJ SOLN
INTRAMUSCULAR | Status: AC
  Filled 2020-03-28: qty 1

## 2020-03-28 MED ORDER — ACETAMINOPHEN 325 MG PO TABS: 325.0000 mg | ORAL_TABLET | ORAL | Status: DC | PRN

## 2020-03-28 MED ORDER — FENTANYL CITRATE (PF) 250 MCG/5ML IJ SOLN
INTRAMUSCULAR | Status: AC
  Filled 2020-03-28: qty 5

## 2020-03-28 MED ORDER — OXYCODONE HCL 5 MG/5ML PO SOLN: 5.0000 mg | Freq: Once | ORAL | Status: DC | PRN

## 2020-03-28 MED ORDER — FENTANYL CITRATE (PF) 100 MCG/2ML IJ SOLN: 25.0000 ug | INTRAMUSCULAR | Status: DC | PRN

## 2020-03-28 MED ORDER — SODIUM CHLORIDE 0.9% FLUSH: 3.0000 mL | Freq: Two times a day (BID) | INTRAVENOUS | Status: DC

## 2020-03-28 MED ORDER — PROPOFOL 10 MG/ML IV BOLUS
INTRAVENOUS | Status: DC | PRN
  Administered 2020-03-28: 200 mg via INTRAVENOUS

## 2020-03-28 MED ORDER — ONDANSETRON HCL 4 MG/2ML IJ SOLN
INTRAMUSCULAR | Status: DC | PRN
  Administered 2020-03-28: 4 mg via INTRAVENOUS

## 2020-03-28 MED ORDER — DEXAMETHASONE SODIUM PHOSPHATE 4 MG/ML IJ SOLN
INTRAMUSCULAR | Status: DC | PRN
  Administered 2020-03-28: 4 mg via INTRAVENOUS

## 2020-03-28 MED ORDER — SODIUM CHLORIDE 0.9 % IR SOLN
Status: DC | PRN
  Administered 2020-03-28: 3000 mL

## 2020-03-28 MED ORDER — ORAL CARE MOUTH RINSE: 15.0000 mL | Freq: Once | OROMUCOSAL | Status: AC

## 2020-03-28 MED ORDER — MIDAZOLAM HCL 5 MG/5ML IJ SOLN
INTRAMUSCULAR | Status: DC | PRN
  Administered 2020-03-28 (×2): 1 mg via INTRAVENOUS

## 2020-03-28 MED ORDER — MIDAZOLAM HCL 2 MG/2ML IJ SOLN
INTRAMUSCULAR | Status: AC
  Filled 2020-03-28: qty 2

## 2020-03-28 MED ORDER — LACTATED RINGERS IV SOLN: INTRAVENOUS | Status: DC

## 2020-03-28 MED ORDER — ONDANSETRON HCL 4 MG/2ML IJ SOLN: 4.0000 mg | Freq: Once | INTRAMUSCULAR | Status: DC | PRN

## 2020-03-28 MED ORDER — SODIUM CHLORIDE 0.9% FLUSH: 3.0000 mL | INTRAVENOUS | Status: DC | PRN

## 2020-03-28 MED ORDER — PROPOFOL 10 MG/ML IV BOLUS
INTRAVENOUS | Status: AC
  Filled 2020-03-28: qty 20

## 2020-03-28 MED ORDER — ACETAMINOPHEN 160 MG/5ML PO SOLN: 325.0000 mg | ORAL | Status: DC | PRN

## 2020-03-28 MED ORDER — MEPERIDINE HCL 50 MG/ML IJ SOLN
6.2500 mg | INTRAMUSCULAR | Status: DC | PRN
Start: 2020-03-28 — End: 2020-03-28

## 2020-03-28 MED ORDER — SODIUM CHLORIDE 0.9 % IV SOLN: 250.0000 mL | INTRAVENOUS | Status: DC | PRN

## 2020-03-28 MED ORDER — CHLORHEXIDINE GLUCONATE 0.12 % MT SOLN
15.0000 mL | Freq: Once | OROMUCOSAL | Status: AC
  Administered 2020-03-28: 09:00:00 15 mL via OROMUCOSAL

## 2020-03-28 MED ORDER — ONDANSETRON HCL 4 MG/2ML IJ SOLN
INTRAMUSCULAR | Status: AC
  Filled 2020-03-28: qty 2

## 2020-03-28 SURGICAL SUPPLY — 23 items
BAG URO CATCHER STRL LF (MISCELLANEOUS) ×3 IMPLANT
BASKET STONE NCOMPASS (UROLOGICAL SUPPLIES) IMPLANT
CATH URET 5FR 28IN OPEN ENDED (CATHETERS) IMPLANT
CATH URET DUAL LUMEN 6-10FR 50 (CATHETERS) IMPLANT
CLOTH BEACON ORANGE TIMEOUT ST (SAFETY) ×3 IMPLANT
EXTRACTOR STONE NITINOL NGAGE (UROLOGICAL SUPPLIES) ×2 IMPLANT
GLOVE SURG SS PI 8.0 STRL IVOR (GLOVE) ×3 IMPLANT
GOWN STRL REUS W/TWL XL LVL3 (GOWN DISPOSABLE) ×3 IMPLANT
GUIDEWIRE STR DUAL SENSOR (WIRE) ×3 IMPLANT
IV NS IRRIG 3000ML ARTHROMATIC (IV SOLUTION) ×3 IMPLANT
KIT TURNOVER KIT A (KITS) IMPLANT
LASER FIB FLEXIVA PULSE ID 365 (Laser) IMPLANT
LASER FIB FLEXIVA PULSE ID 550 (Laser) IMPLANT
LASER FIB FLEXIVA PULSE ID 910 (Laser) IMPLANT
MANIFOLD NEPTUNE II (INSTRUMENTS) ×3 IMPLANT
PACK CYSTO (CUSTOM PROCEDURE TRAY) ×3 IMPLANT
SHEATH URETERAL 12FRX35CM (MISCELLANEOUS) ×2 IMPLANT
STENT URET 6FRX26 CONTOUR (STENTS) ×2 IMPLANT
TRACTIP FLEXIVA PULS ID 200XHI (Laser) IMPLANT
TRACTIP FLEXIVA PULSE ID 200 (Laser) ×3
TUBING CONNECTING 10 (TUBING) ×2 IMPLANT
TUBING CONNECTING 10' (TUBING) ×1
TUBING UROLOGY SET (TUBING) ×3 IMPLANT

## 2020-03-28 NOTE — Transfer of Care (Signed)
Immediate Anesthesia Transfer of Care Note  Patient: Kenneth [name]  Procedure(s) Performed: CYSTOSCOPY WITH RIGHT RETROGRADE PYELOGRAM, URETEROSCOPY WITH HOLMIUM LASER AND STENT PLACEMENT (Right )  Patient Location: PACU  Anesthesia Type:General  Level of Consciousness: awake, alert , oriented and patient cooperative  Airway & Oxygen Therapy: Patient Spontanous Breathing and Patient connected to face mask oxygen  Post-op Assessment: Report given to RN and Post -op Vital signs reviewed and stable  Post vital signs: Reviewed and stable  Last Vitals:  Vitals Value Taken Time  BP 136/68 03/28/20 1237  Temp    Pulse 73 03/28/20 1240  Resp 23 03/28/20 1240  SpO2 100 % 03/28/20 1240  Vitals shown include unvalidated device data.  Last Pain:  Vitals:   03/28/20 0901  TempSrc:   PainSc: 0-No pain         Complications: No complications documented.

## 2020-03-28 NOTE — Op Note (Signed)
Procedure: 1.  Cystoscopy with right retrograde pyelogram and interpretation. 2.  Right ureteroscopic stone extraction with holmium laser application of right distal ureteral stone. 3.  Right ureteroscopic stone extraction with holmium laser application of right renal stones and insertion of right double-J stent. 4.  Application of fluoroscopy.  Preoperative diagnosis: Right distal ureteral stone and right renal stones.  Postop diagnosis: Same.  Surgeon: Dr. Irine Seal.  Anesthesia: General.  Drains: 6 French by 26 cm right contour double-J stent with tether.  Specimen: Stone fragments.  EBL: None.  Complications: None.  Indications: The patient is a 74 year old male with a right mid to distal ureteral stone and nonprogression with right renal stones.  He is to undergo endoscopic management.  Procedure: He was given Ancef.  A general anesthetic was induced.  He was placed in lithotomy position and fitted with PAS hose.  His perineum and genitalia were prepped with Betadine solution he was draped in usual sterile fashion.  Cystoscopy was performed using the 23 [place] scope and 30 degree lens.  Examination revealed a normal urethra.  The external sphincter was intact.  The prostatic urethra was approximately 2 to 3 cm in length with trilobar hyperplasia with some degree of obstruction.  Ureteral orifices were in the normal anatomic position.  The bladder wall had normal mucosa.  There was moderate trabeculation with a 2 cm diverticulum on the right posterior wall.  The right ureteral orifice was cannulated with a 5 French opening catheter and contrast was instilled.  The right retrograde pyelogram demonstrated a filling defect in the upper portion of the distal ureter consistent with impacted stone.  Minimal contrast refluxed proximal to the stone.  A sensor wire was then advanced through the open-ended catheter and was negotiated by the stone up to the level of the renal collecting  system.  A 6.5 French dual-lumen semirigid ureteroscope was then passed alongside the wire and the stone was identified.  The 200 m laser fiber was passed with the initial settings of 0.2 J and 50 Hz with a subsequent increase in the power 2.3 J.  The stone was broken into manageable fragments which were then retrieved with the engage basket to the bladder.  Once all of the distal ureteral fragments had been removed, the sensor wire was then advanced to the kidney and the ureteroscope was removed.  The 12 French inner core of a 35 cm digital access sheath was advanced over the wire to its proximal extent.  The assembled sheath was then passed without difficulty.  The inner core and wire were then removed.  The dual-lumen digital ureteroscope was then advanced through the sheath to the kidney and a stone in the mid to lower pole calyx was identified which is the smaller of the 2 stones and approximately 5 mm in size.  The stone was fragmented with the laser and the fragments were retrieved only leaving small grit and sand.  The upper pole stone was then relocated using the engage basket into the UPJ.  The stone measured approximately 12 mm.  The stone was also fragmented with the laser and the significant fragments were removed with the engage basket leaving only small grit and sand.  Final fluoroscopy demonstrated no significant collections of stone fragments.  The ureteroscope was removed and the sensor wire was then advanced through the sheath to the kidney.  The sheath was removed.  The cystoscope was reinserted over the wire and a 6 [place] by 26 cm contour double-J  stent with tether was passed to the kidney under fluoroscopic guidance.  The wire was removed, leaving a good coil in the kidney and a good coil in the bladder.  The cystoscope was removed leaving the stent string exiting the urethra.  The cystoscope was then reinserted over the string and the stone fragments from the distal stone were  removed.  The bladder was then partially drained and the scope was removed.  The stent string was secured to the patient's penis.  He was taken down from lithotomy position, his anesthetic was reversed and he was moved recovery in stable condition.  There were no complications.  The stone fragments were given to his family.

## 2020-03-28 NOTE — Interval H&P Note (Signed)
History and Physical Interval Note: No change  03/28/2020 10:50 AM  Kenneth [name]  has presented today for surgery, with the diagnosis of RIGHT URETERAL STONE.  The various methods of treatment have been discussed with the patient and family. After consideration of risks, benefits and other options for treatment, the patient has consented to  Procedure(s): CYSTOSCOPY WITH RIGHT RETROGRADE PYELOGRAM, URETEROSCOPY WITH HOLMIUM LASER AND STENT PLACEMENT (Right) as a surgical intervention.  The patient's history has been reviewed, patient examined, no change in status, stable for surgery.  I have reviewed the patient's chart and labs.  Questions were answered to the patient's satisfaction.     Irine Seal

## 2020-03-28 NOTE — Anesthesia Preprocedure Evaluation (Addendum)
Anesthesia Evaluation  Patient identified by MRN, date of birth, ID band Patient awake    Reviewed: Allergy & Precautions, NPO status , Patient's Chart, lab work & pertinent test results  Airway Mallampati: I  TM Distance: >3 FB Neck ROM: Full    Dental no notable dental hx. (+) Teeth Intact, Dental Advisory Given   Pulmonary sleep apnea and Continuous Positive Airway Pressure Ventilation , former smoker,    Pulmonary exam normal breath sounds clear to auscultation       Cardiovascular hypertension, Pt. on medications + angina + CAD, + Cardiac Stents and +CHF  + dysrhythmias Atrial Fibrillation  Rhythm:Regular Rate:Normal  10/2016 Lexiscan  The left ventricular ejection fraction is normal (55-65%).  Nuclear stress EF: 57%. No wall motion abnormalities.  There was no ST segment deviation noted during stress.  The study is normal. No ischemia.  This is a low risk study.    Neuro/Psych negative neurological ROS  negative psych ROS   GI/Hepatic GERD  ,  Endo/Other  diabetes, Type 2  Renal/GU Renal disease     Musculoskeletal  (+) Arthritis ,   Abdominal   Peds  Hematology  (+) Blood dyscrasia, anemia ,   Anesthesia Other Findings   Reproductive/Obstetrics                            Anesthesia Physical  Anesthesia Plan  ASA: III  Anesthesia Plan: General   Post-op Pain Management:    Induction: Intravenous  PONV Risk Score and Plan: 3 and Treatment may vary due to age or medical condition, Ondansetron and Dexamethasone  Airway Management Planned: LMA  Additional Equipment: None  Intra-op Plan:   Post-operative Plan:   Informed Consent: I have reviewed the patients History and Physical, chart, labs and discussed the procedure including the risks, benefits and alternatives for the proposed anesthesia with the patient or authorized representative who has indicated his/her  understanding and acceptance.     Dental advisory given  Plan Discussed with: Anesthesiologist and CRNA  Anesthesia Plan Comments: (See APP note by Durel Salts, FNP)       Anesthesia Quick Evaluation

## 2020-03-28 NOTE — Anesthesia Procedure Notes (Signed)
Procedure Name: LMA Insertion Date/Time: 03/28/2020 11:20 AM Performed by: Garrel Ridgel, CRNA Pre-anesthesia Checklist: Patient identified, Emergency Drugs available and Suction available Patient Re-evaluated:Patient Re-evaluated prior to induction Oxygen Delivery Method: Circle system utilized Preoxygenation: Pre-oxygenation with 100% oxygen Induction Type: IV induction Ventilation: Mask ventilation without difficulty LMA Size: 5.0 Number of attempts: 1 Placement Confirmation: breath sounds checked- equal and bilateral and positive ETCO2 Tube secured with: Tape Dental Injury: Teeth and Oropharynx as per pre-operative assessment

## 2020-03-28 NOTE — Discharge Instructions (Signed)
Ureteral Stent Implantation, Care After This sheet gives you information about how to care for yourself after your procedure. Your health care provider may also give you more specific instructions. If you have problems or questions, contact your health care provider. What can I expect after the procedure? After the procedure, it is common to have:  Nausea.  Mild pain when you urinate. You may feel this pain in your lower back or lower abdomen. The pain should stop within a few minutes after you urinate. This may last for up to 1 week.  A small amount of blood in your urine for several days. Follow these instructions at home: Medicines  Take over-the-counter and prescription medicines only as told by your health care provider.  If you were prescribed an antibiotic medicine, take it as told by your health care provider. Do not stop taking the antibiotic even if you start to feel better.  Do not drive for 24 hours if you were given a sedative during your procedure.  Ask your health care provider if the medicine prescribed to you requires you to avoid driving or using heavy machinery. Activity  Rest as told by your health care provider.  Avoid sitting for a long time without moving. Get up to take short walks every 1-2 hours. This is important to improve blood flow and breathing. Ask for help if you feel weak or unsteady.  Return to your normal activities as told by your health care provider. Ask your health care provider what activities are safe for you. General instructions   Watch for any blood in your urine. Call your health care provider if the amount of blood in your urine increases.  If you have a catheter: ? Follow instructions from your health care provider about taking care of your catheter and collection bag. ? Do not take baths, swim, or use a hot tub until your health care provider approves. Ask your health care provider if you may take showers. You may only be allowed to  take sponge baths.  Drink enough fluid to keep your urine pale yellow.  Do not use any products that contain nicotine or tobacco, such as cigarettes, e-cigarettes, and chewing tobacco. These can delay healing after surgery. If you need help quitting, ask your health care provider.  Keep all follow-up visits as told by your health care provider. This is important. Contact a health care provider if:  You have pain that gets worse or does not get better with medicine, especially pain when you urinate.  You have difficulty urinating.  You feel nauseous or you vomit repeatedly during a period of more than 2 days after the procedure. Get help right away if:  Your urine is dark red or has blood clots in it.  You are leaking urine (have incontinence).  The end of the stent comes out of your urethra.  You cannot urinate.  You have sudden, sharp, or severe pain in your abdomen or lower back.  You have a fever.  You have swelling or pain in your legs.  You have difficulty breathing. Summary  After the procedure, it is common to have mild pain when you urinate that goes away within a few minutes after you urinate. This may last for up to 1 week.  Watch for any blood in your urine. Call your health care provider if the amount of blood in your urine increases.  Take over-the-counter and prescription medicines only as told by your health care provider.  Drink   enough fluid to keep your urine pale yellow.  You may remove the stent by pulling the attached string on Friday morning.   This information is not intended to replace advice given to you by your health care provider. Make sure you discuss any questions you have with your health care provider. Document Revised: 12/30/2017 Document Reviewed: 12/31/2017 Elsevier Patient Education  2020 [employer]  

## 2020-03-29 ENCOUNTER — Encounter (HOSPITAL_COMMUNITY): Payer: Self-pay | Admitting: Urology

## 2020-04-03 ENCOUNTER — Other Ambulatory Visit: Payer: Self-pay

## 2020-04-03 ENCOUNTER — Other Ambulatory Visit: Payer: Self-pay | Admitting: Cardiology

## 2020-04-03 DIAGNOSIS — N202 Calculus of kidney with calculus of ureter: Secondary | ICD-10-CM | POA: Diagnosis not present

## 2020-04-03 MED ORDER — ROSUVASTATIN CALCIUM 20 MG PO TABS: 20.0000 mg | ORAL_TABLET | Freq: Every evening | ORAL | 3 refills | Status: DC

## 2020-04-03 NOTE — Anesthesia Postprocedure Evaluation (Signed)
Anesthesia Post Note  Patient: REBECCA MOTTA  Procedure(s) Performed: CYSTOSCOPY WITH RIGHT RETROGRADE PYELOGRAM, URETEROSCOPY WITH HOLMIUM LASER AND STENT PLACEMENT (Right )     Patient location during evaluation: PACU Anesthesia Type: General Level of consciousness: awake and alert Pain management: pain level controlled Vital Signs Assessment: post-procedure vital signs reviewed and stable Respiratory status: spontaneous breathing, nonlabored ventilation, respiratory function stable and patient connected to nasal cannula oxygen Cardiovascular status: blood pressure returned to baseline and stable Postop Assessment: no apparent nausea or vomiting Anesthetic complications: no   No complications documented.  Last Vitals:  Vitals:   03/28/20 1316 03/28/20 1322  BP: 99/81 (P) 137/76  Pulse: 62   Resp: 19   Temp: 36.7 C   SpO2: 100%     Last Pain:  Vitals:   03/28/20 1316  TempSrc: Oral  PainSc: 0-No pain                 Gaege Sangalang

## 2020-04-19 DIAGNOSIS — E119 Type 2 diabetes mellitus without complications: Secondary | ICD-10-CM | POA: Diagnosis not present

## 2020-04-19 DIAGNOSIS — Z7984 Long term (current) use of oral hypoglycemic drugs: Secondary | ICD-10-CM | POA: Diagnosis not present

## 2020-04-19 DIAGNOSIS — H35342 Macular cyst, hole, or pseudohole, left eye: Secondary | ICD-10-CM | POA: Diagnosis not present

## 2020-04-19 DIAGNOSIS — H43811 Vitreous degeneration, right eye: Secondary | ICD-10-CM | POA: Diagnosis not present

## 2020-04-19 DIAGNOSIS — H0100B Unspecified blepharitis left eye, upper and lower eyelids: Secondary | ICD-10-CM | POA: Diagnosis not present

## 2020-04-19 DIAGNOSIS — H0100A Unspecified blepharitis right eye, upper and lower eyelids: Secondary | ICD-10-CM | POA: Diagnosis not present

## 2020-04-19 DIAGNOSIS — H04123 Dry eye syndrome of bilateral lacrimal glands: Secondary | ICD-10-CM | POA: Diagnosis not present

## 2020-04-19 DIAGNOSIS — H5203 Hypermetropia, bilateral: Secondary | ICD-10-CM | POA: Diagnosis not present

## 2020-04-19 DIAGNOSIS — H52203 Unspecified astigmatism, bilateral: Secondary | ICD-10-CM | POA: Diagnosis not present

## 2020-04-19 DIAGNOSIS — H35373 Puckering of macula, bilateral: Secondary | ICD-10-CM | POA: Diagnosis not present

## 2020-04-19 DIAGNOSIS — H35033 Hypertensive retinopathy, bilateral: Secondary | ICD-10-CM | POA: Diagnosis not present

## 2020-04-19 DIAGNOSIS — H43392 Other vitreous opacities, left eye: Secondary | ICD-10-CM | POA: Diagnosis not present

## 2020-05-31 DIAGNOSIS — N200 Calculus of kidney: Secondary | ICD-10-CM | POA: Diagnosis not present

## 2020-06-16 ENCOUNTER — Encounter: Payer: Self-pay | Admitting: Pulmonary Disease

## 2020-06-16 ENCOUNTER — Other Ambulatory Visit: Payer: Self-pay

## 2020-06-16 ENCOUNTER — Ambulatory Visit (INDEPENDENT_AMBULATORY_CARE_PROVIDER_SITE_OTHER): Payer: Medicare Other | Admitting: Pulmonary Disease

## 2020-06-16 VITALS — BP 138/74 | HR 78 | Temp 98.0°F | Ht 75.0 in | Wt 220.0 lb

## 2020-06-16 DIAGNOSIS — J849 Interstitial pulmonary disease, unspecified: Secondary | ICD-10-CM | POA: Diagnosis not present

## 2020-06-16 MED ORDER — ALBUTEROL SULFATE HFA 108 (90 BASE) MCG/ACT IN AERS: 2.0000 | INHALATION_SPRAY | Freq: Four times a day (QID) | RESPIRATORY_TRACT | 6 refills | Status: DC | PRN

## 2020-06-16 MED ORDER — PREDNISONE 5 MG PO TABS: ORAL_TABLET | ORAL | 3 refills | Status: DC

## 2020-06-16 NOTE — Assessment & Plan Note (Signed)
This is previously been felt to be NSIP on previous imaging.  He appears to be symptomatically worse.  We had maintained him on very low-dose prednisone, I will ask him to increase this slightly and reassess with high-resolution CT scan as a 29-month follow-up. He will increase prednisone to 10 mg 3 days a week and alternate with 5 mg. I will also provide him in albuterol MDI prescription to use on an as-needed basis  His joint disease is stable so I do not feel that he is having a flareup.  He does not seem to have pedal edema related to cardiac cause of his worsening

## 2020-06-16 NOTE — Progress Notes (Signed)
   Subjective:    Patient ID: Kenneth [name], male    DOB: [birth year], 75 y.o.   MRN: [id number]  HPI  75 yo man with  steroid responsive ILD , probable NSIP.There is a history of being on methotrexate in the remote past. He has responded well to steroids and has been maintained on low-dose about 5 mg of prednisone. We have discussed immunosuppressants in the past and he has preferred not to go on this  PMH - CAD, atrial fibrillation for 15 years s/p atrial ablation 01/2014 . He takes flecainide if his heart rate gets too high.  For psoriatic arthritis, he was on Enbrel and methotrexate weekly for 3 years, was then onHumira.  Now on adalimumab  Chief Complaint  Patient presents with  . Follow-up    3 month follow up ILD.  Breathing has not been better.  He gets a little dizzy at times and this is normally when he first stands. He stated that he gets [facility] with exertion.     Wife is doing mildly better, ambulates with a walker, he is the main caregiver Last OV >> he had stopped prednisone for 3 days , we resumed at 5mg  daily Underwent surgery for renal calculi- dr Jeffie Pollock, no issues. He still complains of increased shortness of breath when on bad days takes 10 mg of prednisone.  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 His weight is stable he had lost from 262 to 26 pounds and is maintained around 220 for the last 9 months  Ambulatory saturation >> dropped to 93% on walking and recovered quickly  Significant tests/ events reviewed CT angiogram 09/2007 which shows bilateral groundglass opacities but a repeat CT in 10/2007 and shows resolution of these infiltrates.  01/27/2014 HRCT - patchy areas of very mild ground-glass attenuation and subpleural reticulation. CHest HRCT 04/2016 >> slight worse , NSIP pattern HRCT 05/2017 unchangedNSIP, 2-4 mm new  benign nodules  HRCT 10/2019 >> likely NSIP,  unchanged from 2019   PFT 04/2015 >> ratio nml, FVC 61%, TLC 68%, DLCO 67%    11/15/2013-echocardiogram-LV ejection fraction 65 to 70%, mild concentric hypertrophy, PAP pressure 35    Review of Systems neg for any significant sore throat, dysphagia, itching, sneezing, nasal congestion or excess/ purulent secretions, fever, chills, sweats, unintended wt loss, pleuritic or exertional cp, hempoptysis, orthopnea pnd or change in chronic leg swelling. Also denies presyncope, palpitations, heartburn, abdominal pain, nausea, vomiting, diarrhea or change in bowel or urinary habits, dysuria,hematuria, rash, arthralgias, visual complaints, headache, numbness weakness or ataxia.     Objective:   Physical Exam  Gen. Pleasant, obese, in no distress ENT - no lesions, no post nasal drip Neck: No JVD, no thyromegaly, no carotid bruits Lungs: no use of accessory muscles, no dullness to percussion, RT basal dry rales no rhonchi  Cardiovascular: Rhythm regular, heart sounds  normal, no murmurs or gallops, no peripheral edema Musculoskeletal: No deformities, no cyanosis or clubbing , no tremors       Assessment & Plan:

## 2020-06-16 NOTE — Patient Instructions (Addendum)
Ambulatory satn  Schedule HRCT Take 10 mg prednisone on M/W/F & 5mg  prednisone on other days Albuterol MDI 2 puffs every q 6h as needed

## 2020-07-03 ENCOUNTER — Ambulatory Visit
Admission: RE | Admit: 2020-07-03 | Discharge: 2020-07-03 | Disposition: A | Discharge status: Home / Self Care | Payer: Medicare Other | Source: Ambulatory Visit | Attending: Pulmonary Disease | Admitting: Pulmonary Disease

## 2020-07-03 ENCOUNTER — Other Ambulatory Visit: Payer: Self-pay

## 2020-07-03 DIAGNOSIS — R0602 Shortness of breath: Secondary | ICD-10-CM | POA: Diagnosis not present

## 2020-07-03 DIAGNOSIS — J849 Interstitial pulmonary disease, unspecified: Secondary | ICD-10-CM

## 2020-07-04 NOTE — Progress Notes (Signed)
Called and went over CT results per Dr Alva with patient. All questions answered and patient expressed full understanding. Nothing further needed at this time.

## 2020-07-08 ENCOUNTER — Other Ambulatory Visit: Payer: Self-pay | Admitting: Cardiology

## 2020-07-17 ENCOUNTER — Telehealth: Payer: Self-pay | Admitting: Pulmonary Disease

## 2020-07-17 MED ORDER — DOXYCYCLINE HYCLATE 100 MG PO TABS: 100.0000 mg | ORAL_TABLET | Freq: Every day | ORAL | 0 refills | Status: DC

## 2020-07-17 MED ORDER — PREDNISONE 10 MG PO TABS: ORAL_TABLET | ORAL | 0 refills | Status: AC

## 2020-07-17 NOTE — Telephone Encounter (Signed)
Called and spoke with patient, advised of recommendations per Dr. Elsworth Soho.  Her verbalized understanding.  Verified pharmacy with patient as [facility] on [address].  Advised I would send in the prescriptions.  He verbalized understanding.  Nothing further needed.

## 2020-07-17 NOTE — Telephone Encounter (Signed)
Doxyxyxline 100 mg daily x 7 days Prednisone 10 mg tabs  Take 2 tabs daily with food x 5ds, then 1  daily x 5 days , then go back to prescribed dose of 10 alternating with 5

## 2020-07-17 NOTE — Telephone Encounter (Signed)
Primary Pulmonologist: Kenneth [name] Last office visit and with whom: 06/16/20 Kenneth [name] What do we see them for (pulmonary problems): ILD Last OV assessment/plan:  Assessment & Plan:        Assessment & Plan Note by Kenneth Noel, Kenneth [name] at 06/16/2020 9:40 AM  Author: Rigoberto Noel, Kenneth [name] Author Type: Physician Filed: 06/16/2020 9:41 AM  Note Status: Written Cosign: Cosign Not Required Encounter Date: 06/16/2020  Problem: ILD (interstitial lung disease) ([place])  Editor: Kenneth Noel, Kenneth [name] (Physician)               This is previously been felt to be NSIP on previous imaging.  He appears to be symptomatically worse.  We had maintained him on very low-dose prednisone, I will ask him to increase this slightly and reassess with high-resolution CT scan as a 51-month follow-up. He will increase prednisone to 10 mg 3 days a week and alternate with 5 mg. I will also provide him in albuterol MDI prescription to use on an as-needed basis  His joint disease is stable so I do not feel that he is having a flareup.  He does not seem to have pedal edema related to cardiac cause of his worsening       Patient Instructions by Kenneth Noel, Kenneth [name] at 06/16/2020 9:15 AM  Author: Rigoberto Noel, Kenneth [name] Author Type: Physician Filed: 06/16/2020 9:21 AM  Note Status: Addendum Cosign: Cosign Not Required Encounter Date: 06/16/2020  Editor: Kenneth Noel, Kenneth [name] (Physician)      Prior Versions: 1. Kenneth Noel, Kenneth [name] (Physician) at 06/16/2020 9:18 AM - Signed    Ambulatory satn  Schedule HRCT Take 10 mg prednisone on M/W/F & 5mg  prednisone on other days Albuterol MDI 2 puffs every q 6h as needed         Instructions     Return in about 3 months (around 09/16/2020).  Ambulatory satn  Schedule HRCT Take 10 mg prednisone on M/W/F & 5mg  prednisone on other days Albuterol MDI 2 puffs every q 6h as needed       Reason for call: Productive cough with green mucous for 2 days, in the shower this morning the mucous was  pale red, head and chest congestion and no energy.  Denies any fever, chills or body aches.  Wheezing mostly at night.  Cannot lay down to sleep.  SOB is more than what is normal for him.  Does not check oxygen levels at home.  No sick contacts.  Has has covid vaccines and booster.  Has not had flu vaccine.  Has not taken a covid test.  Has not used Albuterol for sob.  Taking Delsym night time for cough, gets some relief.  He is on Prednisone 10 mg MWF and 5 mg on the other days.  Dr. Elsworth [name], please advise.  (examples of things to ask: : When did symptoms start? Fever? Cough? Productive? Color to sputum? More sputum than usual? Wheezing? Have you needed increased oxygen? Are you taking your respiratory medications? What over the counter measures have you tried?)  Allergies  Allergen Reactions  . Hydrocodone Shortness Of Breath    In combination with decongestants  . Other Palpitations and Other (See Comments)    ALL DECONGESTANTS - CAUSE PALPITATIONS; THROWS HEART RHYTHM OUT OF BALANCE  . Oxycodone Shortness Of Breath  . Pseudoephedrine Palpitations  . Tramadol Shortness Of Breath and Rash    flushing  . Gemfibrozil Other (See Comments)    Dizziness  Immunization History  Administered Date(s) Administered  . Fluad Quad(high Dose 65+) 11/30/2018, 02/01/2020  . H1N1 03/18/2008  . Influenza Split 03/27/2011, 01/17/2017  . Influenza Whole 02/24/2008, 01/12/2009  . Influenza, High Dose Seasonal PF 01/15/2016, 12/30/2016, 01/26/2018  . Influenza,inj,Quad PF,6+ Mos 01/03/2014  . Influenza-Unspecified 01/23/2015  . Moderna Sars-Covid-2 Vaccination 06/03/2019, 07/01/2019, 12/09/2019  . Pneumococcal Polysaccharide-23 11/30/2018  . Pneumococcal-Unspecified 01/15/2016  . Tdap 02/01/2020

## 2020-07-24 DIAGNOSIS — R3121 Asymptomatic microscopic hematuria: Secondary | ICD-10-CM | POA: Diagnosis not present

## 2020-07-24 DIAGNOSIS — N200 Calculus of kidney: Secondary | ICD-10-CM | POA: Diagnosis not present

## 2020-07-25 ENCOUNTER — Other Ambulatory Visit: Payer: Self-pay | Admitting: Urology

## 2020-07-25 DIAGNOSIS — N200 Calculus of kidney: Secondary | ICD-10-CM

## 2020-07-26 ENCOUNTER — Other Ambulatory Visit: Payer: Self-pay | Admitting: Urology

## 2020-07-26 ENCOUNTER — Telehealth: Payer: Self-pay | Admitting: Cardiology

## 2020-07-26 NOTE — Telephone Encounter (Signed)
Left message for pam to call back, we can do the EKG as a nurse visit.

## 2020-07-26 NOTE — Telephone Encounter (Signed)
    Pam with [facility] calling, she said pt is scheduled for lithotripsy on 07/31/20, pt doesn't need clearance but they need for pt to get EKG, they need result before Monday. No appt available she is wondering if pt can get a nurse visit to get EKG

## 2020-07-27 ENCOUNTER — Ambulatory Visit (INDEPENDENT_AMBULATORY_CARE_PROVIDER_SITE_OTHER): Payer: Medicare Other | Admitting: *Deleted

## 2020-07-27 ENCOUNTER — Other Ambulatory Visit: Payer: Self-pay

## 2020-07-27 VITALS — HR 83

## 2020-07-27 DIAGNOSIS — I2510 Atherosclerotic heart disease of native coronary artery without angina pectoris: Secondary | ICD-10-CM

## 2020-07-27 DIAGNOSIS — Z01810 Encounter for preprocedural cardiovascular examination: Secondary | ICD-10-CM | POA: Diagnosis not present

## 2020-07-27 NOTE — Progress Notes (Signed)
Patient to arrive at 0600 on 07/31/2020. History and medications reviewed. Pre-procedure instructions given. Patient to stop ASA on 07/28/2020. States he takes metformin and cardizem in the evenings. Will take on Sunday as per usual. NPO after MN. Driver secured.

## 2020-07-27 NOTE — Progress Notes (Signed)
1.) Reason for visit: EKG check at request of [facility]  2.) Name of MD requesting visit: [facility] (patient of Dr. Ellyn Hack)  3.) H&P: CAD, h/o PAFlutter s/p ablation  4.) ROS related to problem: no complaints - planned lithotripsy on 07/31/20  5.) Assessment and plan per MD: EKG performed - NSR @ 83   -- reviewed by DOD Dr. Stanford Breed   -- copy of EKG given to patient & faxed to [facility] (attn: Dr. Lovena Neighbours)    Fax: [phone]

## 2020-07-28 ENCOUNTER — Other Ambulatory Visit (HOSPITAL_COMMUNITY)
Admission: RE | Admit: 2020-07-28 | Discharge: 2020-07-28 | Disposition: A | Discharge status: Home / Self Care | Payer: Medicare Other | Source: Ambulatory Visit | Attending: Urology | Admitting: Urology

## 2020-07-28 DIAGNOSIS — U071 COVID-19: Secondary | ICD-10-CM | POA: Insufficient documentation

## 2020-07-28 DIAGNOSIS — Z01812 Encounter for preprocedural laboratory examination: Secondary | ICD-10-CM | POA: Insufficient documentation

## 2020-07-28 LAB — SARS CORONAVIRUS 2 (TAT 6-24 HRS): SARS Coronavirus 2: POSITIVE — AB

## 2020-07-29 NOTE — Progress Notes (Signed)
Spoke with Dr. Gloriann Loan, doc on call, and informed that patient tested positive for COVID 07/28/20.  Dr. Gloriann Loan verbalized understanding that he will need to inform patient of positive result and that surgery will need to be delayed 10 days unless deemed an emergency case.

## 2020-07-30 ENCOUNTER — Telehealth: Payer: Self-pay | Admitting: Infectious Diseases

## 2020-07-30 NOTE — Telephone Encounter (Signed)
Called to discuss with patient about COVID-19 symptoms and the use of one of the available treatments for those with mild to moderate Covid symptoms and at a high risk of hospitalization.  Pt appears to qualify for outpatient treatment due to co-morbid conditions and/or a member of an at-risk group in accordance with the FDA Emergency Use Authorization.    Symptom onset:  Vaccinated:  Booster?  Immunocompromised?  Qualifiers:   He states he "does not have COVID even though he has a positive test for 2 years now and he has no symptoms whatsoever."  Had a COVID test at [place] 2 months ago and it was positive then but repeat in MD's office was negative. I tried to explain to him that it depends on the kind of test done that can influence the results. He hung up the phone and I did not contact him back.  It sounds like he has a persistently positive PCR from previous infection a few months ago if I had to guess.   Janene Madeira

## 2020-07-31 ENCOUNTER — Other Ambulatory Visit: Payer: Self-pay | Admitting: Urology

## 2020-07-31 ENCOUNTER — Telehealth (HOSPITAL_COMMUNITY): Payer: Self-pay

## 2020-07-31 ENCOUNTER — Ambulatory Visit (HOSPITAL_BASED_OUTPATIENT_CLINIC_OR_DEPARTMENT_OTHER): Admission: RE | Admit: 2020-07-31 | Payer: Medicare Other | Source: Home / Self Care | Admitting: Urology

## 2020-07-31 DIAGNOSIS — N200 Calculus of kidney: Secondary | ICD-10-CM

## 2020-07-31 SURGERY — LITHOTRIPSY, ESWL
Anesthesia: LOCAL | Laterality: Left

## 2020-07-31 NOTE — Telephone Encounter (Signed)
07/31/20 - Surgeon, patient contacted on 07/30/20 regarding patient's Positive COVID19 Lab Results by S. Doren Custard, NP. MBM

## 2020-08-07 NOTE — Progress Notes (Signed)
Left message for patient to return call for ESWL instructions. 

## 2020-08-08 NOTE — Progress Notes (Signed)
Left 2nd message for patient to return call for ESWL instructions.

## 2020-08-09 NOTE — Progress Notes (Signed)
Tried calling Patient this am . Left voice message to return call. Reached out to granddaughter for him to return our call

## 2020-08-09 NOTE — Progress Notes (Signed)
Pt's wife, Judeen Hammans called back. Instructions went over with wife. Arrival time 1200. Cl. Liquids until 1000. Driver is granddaughter.  Stopped ASA on 5/2 at 0830. Tested positive  For covid on 4/22/252 no retest needed

## 2020-08-10 ENCOUNTER — Encounter (HOSPITAL_BASED_OUTPATIENT_CLINIC_OR_DEPARTMENT_OTHER): Payer: Self-pay | Admitting: Urology

## 2020-08-10 ENCOUNTER — Ambulatory Visit (HOSPITAL_BASED_OUTPATIENT_CLINIC_OR_DEPARTMENT_OTHER)
Admission: RE | Admit: 2020-08-10 | Discharge: 2020-08-10 | Disposition: A | Discharge status: Home / Self Care | Payer: Medicare Other | Attending: Urology | Admitting: Urology

## 2020-08-10 ENCOUNTER — Ambulatory Visit (HOSPITAL_COMMUNITY): Payer: Medicare Other

## 2020-08-10 ENCOUNTER — Encounter (HOSPITAL_BASED_OUTPATIENT_CLINIC_OR_DEPARTMENT_OTHER)
Admission: RE | Disposition: A | Discharge status: Home / Self Care | Payer: Self-pay | Source: Home / Self Care | Attending: Urology

## 2020-08-10 ENCOUNTER — Other Ambulatory Visit: Payer: Self-pay

## 2020-08-10 DIAGNOSIS — E119 Type 2 diabetes mellitus without complications: Secondary | ICD-10-CM | POA: Diagnosis not present

## 2020-08-10 DIAGNOSIS — Z01818 Encounter for other preprocedural examination: Secondary | ICD-10-CM | POA: Diagnosis not present

## 2020-08-10 DIAGNOSIS — Z7984 Long term (current) use of oral hypoglycemic drugs: Secondary | ICD-10-CM | POA: Diagnosis not present

## 2020-08-10 DIAGNOSIS — N200 Calculus of kidney: Secondary | ICD-10-CM | POA: Diagnosis not present

## 2020-08-10 DIAGNOSIS — R3129 Other microscopic hematuria: Secondary | ICD-10-CM | POA: Diagnosis not present

## 2020-08-10 DIAGNOSIS — Z7982 Long term (current) use of aspirin: Secondary | ICD-10-CM | POA: Insufficient documentation

## 2020-08-10 DIAGNOSIS — Z79899 Other long term (current) drug therapy: Secondary | ICD-10-CM | POA: Diagnosis not present

## 2020-08-10 DIAGNOSIS — I4891 Unspecified atrial fibrillation: Secondary | ICD-10-CM | POA: Diagnosis not present

## 2020-08-10 HISTORY — PX: EXTRACORPOREAL SHOCK WAVE LITHOTRIPSY: SHX1557

## 2020-08-10 LAB — GLUCOSE, CAPILLARY: Glucose-Capillary: 138 mg/dL — ABNORMAL HIGH (ref 70–99)

## 2020-08-10 SURGERY — LITHOTRIPSY, ESWL
Anesthesia: LOCAL | Laterality: Left

## 2020-08-10 MED ORDER — DIPHENHYDRAMINE HCL 25 MG PO CAPS
25.0000 mg | ORAL_CAPSULE | ORAL | Status: AC
  Administered 2020-08-10: 25 mg via ORAL

## 2020-08-10 MED ORDER — DIAZEPAM 5 MG PO TABS
ORAL_TABLET | ORAL | Status: AC
  Filled 2020-08-10: qty 2

## 2020-08-10 MED ORDER — CIPROFLOXACIN HCL 500 MG PO TABS
500.0000 mg | ORAL_TABLET | ORAL | Status: AC
  Administered 2020-08-10: 500 mg via ORAL

## 2020-08-10 MED ORDER — CIPROFLOXACIN HCL 500 MG PO TABS
ORAL_TABLET | ORAL | Status: AC
  Filled 2020-08-10: qty 1

## 2020-08-10 MED ORDER — DIPHENHYDRAMINE HCL 25 MG PO CAPS
ORAL_CAPSULE | ORAL | Status: AC
  Filled 2020-08-10: qty 1

## 2020-08-10 MED ORDER — DIAZEPAM 5 MG PO TABS
10.0000 mg | ORAL_TABLET | ORAL | Status: AC
  Administered 2020-08-10: 10 mg via ORAL

## 2020-08-10 MED ORDER — SODIUM CHLORIDE 0.9 % IV SOLN: INTRAVENOUS | Status: DC

## 2020-08-10 NOTE — Discharge Instructions (Signed)

## 2020-08-10 NOTE — H&P (Signed)
I have kidney stones.  HPI: Kenneth [name] is a 75 year-old male established patient who is here for renal calculi.    [name] returns today for a right renal US following ureteroscopy on 03/28/20. there is no hydro but two small stones are seen. he had a single episode about 2 weeks ago of passing a small amount of blood with possible stone. He has no pain or other complaints at this time. He has >60 RBC's in his urine today which is stable. His IPSS is 13 with frequency and nocturia 2-3x. 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      ALLERGIES: Decongestant TABS - Other Reaction, Tachycardia    MEDICATIONS: Flomax 0.4 mg capsule 1 capsule PO Daily  Metformin Hcl 500 mg tablet 1 tablet PO Daily  Acetaminophen 500 mg tablet 1 tablet PO Daily  Acitretin 10 mg capsule  Biotin 1,000 mcg tablet,chewable 1 tablet PO Daily  Calcipotriene 0.005 % ointment 1 PO Daily  Cetirizine Hcl 10 mg tablet 1 tablet PO Daily  Clobetasol Propionate 0.05 % cream 1 PO Daily  Cordran 4 mcg/cm2 tape, medicated 1 PO Daily  Diltiazem 24Hr Er (Cd) 180 mg capsule, ext release 24 hr 1 capsule PO Daily  Ferrous Sulfate 325 mg (65 mg iron) tablet 1 tablet PO Daily  Gabapentin 300 mg capsule 1 capsule PO Daily  Hydromorphone Hcl 2 mg tablet 1 tablet PO Q 6 H PRN  Hardin Negus' Colon Health 1 PO Daily  Prednisone 5 mg tablet 1 tablet PO Daily  Rosuvastatin Calcium 20 mg tablet 1 tablet PO Daily  Sildenafil Citrate 100 mg tablet 1 tablet PO as needed  Zolpidem Tartrate 10 mg tablet 1 tablet PO Daily     GU PSH: Cysto Uretero Lithotripsy, Right - 03/28/2020, 2009 Cystoscopy Insert Stent - 2009 Locm 300-399Mg /Ml Iodine,1Ml - 2019 Ureteroscopic laser litho, Right - 03/28/2020, Right - 10/08/2019, Right - 2019 Ureteroscopic stone removal, Right - 2019, 2009       [place] Notes: Cardiac Catheter His Ablation     NON-GU PSH: Appendectomy - 2009 Cardiac Stent Placement - about 2018 Cataract surgery Sinus  Surgery..     GU PMH: Renal calculus, He has 2 small non-obstructing right renal stones. he has been rescheduled for f/u on 4/18. - 05/31/2020, He has bilateral non-obstructing calculi in each kidney., - 03/15/2020, He has a renal stone but I don't see a ureteral stone but with the hematuria and pain, a small ureteral stone is possible. I have sent a script for hydromorphone and he will return in 2 weeks for reevaluation. If his symptoms persist, he will need a CT. , - 02/24/2020, He has bilateral renal stones that are non-obstructing. I will just have him return in 6 months with a KUB. , - 12/03/2019, - 09/23/2019, - 09/15/2019, - 09/08/2019, - 06/04/2019, - 05/14/2019, - 11/27/2018, He has possible stones on [place] but the KUB is clear and his CT from 7/22 didn't show significant left renal stones. I don't think the flank pain is from a stone. , - 02/04/2018 (Stable), - 2019, Kidney stone on right side, - 2014 Renal and ureteral calculus - 04/03/2020 Gross hematuria - 03/15/2020, - 02/24/2020, He continues to have hemturia that is probably secondary to the right proximal ureteral stone and antiplatelet therapy. He will hold the plavix and continue aspirin until the bleeding stops. I will get him set up for cystoscopy with right ureteroscopy with stone extraction. I have reviewed the risks of  ureteroscopy including bleeding, infection, ureteral injury, need for a stent or secondary procedures, thrombotic events and anesthetic complications. , - 2019 (Acute), Will proceed with hematuria workup with CT and cysto. Will check PSA today. Culture urine and send for cytology. Will empirically begin Cephalexin 500 mg 1 po BID X 7 days. , - 2019 Ureteral calculus (Stable) - 03/15/2020, (Stable), - 2019, Distal Ureteral Stone On The Right, - 2014, Calculus of distal right ureter, - 2014, Distal Ureteral Stone On The Right, - 2014, Mid Ureteral Stone On The Left, - 2014, Calculus of ureter, - 2014 ED due to arterial insufficiency,  sildenafil was dispensed. - 02/24/2020, He will get 100mg  sildenafil. , - 02/04/2018, Erectile dysfunction due to arterial insufficiency, - 2014 Flank Pain - 02/24/2020, Generalized abdominal pain, - 2014 Ureteral obstruction secondary to calculous - 09/08/2019 BPH w/LUTS - 06/04/2019, - 04/30/2019, Benign prostatic hyperplasia with urinary obstruction, - 2014 Nocturia - 06/04/2019 Urinary Frequency - 04/16/2019 Dysuria - 11/27/2018 Microscopic hematuria, I will have him return in 79months for reevaluation. - 02/04/2018 Acute Cystitis/UTI - 2019 History of urolithiasis, Nephrolithiasis - 2014 Other microscopic hematuria, Microscopic hematuria - 2014 Testicular atrophy, Testicular atrophy - 2014 Urinary Urgency, Urinary urgency - 2014      PMH Notes: heart murmur   Shingles   NON-GU PMH: Atherosclerosis of aorta, He has aortoiliac and coronary calcifications but is on metformin and rosuvastatin and is followed at the [place]. I just let him know the findings on the most recent CT. - 12/03/2019 Asthma, Asthma - 2014 Cardiac murmur, unspecified, Murmurs - 2014 Unspecified atrial fibrillation, Atrial Fibrillation - 2014 Arrhythmia Arthritis Atrial Fibrillation Coronary Artery Disease Diabetes Type 2 Encounter for general adult medical examination without abnormal findings, Encounter for preventive health examination Hypercholesterolemia Hypertension    FAMILY HISTORY: Breast Cancer - Mother Colon Cancer - Mother Dementia - Father, Mother Kidney Stones - Mother Prostate Cancer - Brother    Notes: Mother deceased at age 48 from dementia  Father deceased at 2 from dementia   SOCIAL HISTORY: Marital Status: Married Preferred Language: English; Ethnicity: Not Hispanic Or Latino; Race: White Current Smoking Status: Patient has never smoked.   Tobacco Use Assessment Completed: Used Tobacco in last 30 days? Does not use smokeless tobacco. Drinks 1 drink per year.  Does not use drugs. Drinks 1  caffeinated drink per day. Patient's occupation is/was Retired.    REVIEW OF SYSTEMS:    GU Review Male:   Patient reports frequent urination and get up at night to urinate. Patient denies hard to postpone urination, burning/ pain with urination, leakage of urine, stream starts and stops, trouble starting your stream, have to strain to urinate , erection problems, and penile pain.  Gastrointestinal (Upper):   Patient denies nausea, vomiting, and indigestion/ heartburn.  Gastrointestinal (Lower):   Patient denies diarrhea and constipation.  Constitutional:   Patient denies fever, night sweats, weight loss, and fatigue.  Skin:   Patient denies skin rash/ lesion and itching.  Eyes:   Patient denies blurred vision and double vision.  Ears/ Nose/ Throat:   Patient denies sore throat and sinus problems.  Hematologic/Lymphatic:   Patient denies swollen glands and easy bruising.  Cardiovascular:   Patient denies leg swelling and chest pains.  Respiratory:   Patient denies cough and shortness of breath.  Endocrine:   Patient denies excessive thirst.  Musculoskeletal:   Patient denies back pain and joint pain.  Neurological:   Patient denies headaches and dizziness.  Psychologic:  Patient denies depression and anxiety.   VITAL SIGNS:      07/24/2020 02:35 PM  BP 143/77 mmHg  Pulse 81 /min  Temperature 98.4 F / 36.8 C   MULTI-SYSTEM PHYSICAL EXAMINATION:    Constitutional: Well-nourished. No physical deformities. Normally developed. Good grooming.  Respiratory: No labored breathing, no use of accessory muscles.   Cardiovascular: Regular rate and rhythm. No murmur 3/5 SEM, no gallop.      Complexity of Data:  Records Review:   AUA Symptom Score, Previous Patient Records  Urine Test Review:   Urinalysis  X-Ray Review: KUB: Reviewed Films. Discussed With Patient.     09/22/17 08/29/09  PSA  Total PSA 1.95 ng/mL 1.69     10/20/07  Hormones  Testosterone, Total 2.33     PROCEDURES:          KUB - 74018  A single view of the abdomen is obtained. He has a 12x29mm left UPJ stone. He has vascular calcifications in the pelvis. He has no new bone, gas or soft tissue abnormalities.       Patient confirmed No Neulasta OnPro Device.           Urinalysis w/Scope Dipstick Dipstick Cont'd Micro  Color: Amber Bilirubin: Neg mg/dL WBC/hpf: 6 - 10/hpf  Appearance: Cloudy Ketones: Neg mg/dL RBC/hpf: >60/hpf  Specific Gravity: 1.015 Blood: 3+ ery/uL Bacteria: NS (Not Seen)  pH: 6.0 Protein: Trace mg/dL Cystals: NS (Not Seen)  Glucose: Trace mg/dL Urobilinogen: 0.2 mg/dL Casts: NS (Not Seen)    Nitrites: Neg Trichomonas: Not Present    Leukocyte Esterase: Neg leu/uL Mucous: Present      Epithelial Cells: 0 - 5/hpf      Yeast: NS (Not Seen)      Sperm: Not Present    ASSESSMENT:      ICD-10 Details  1 GU:   Renal calculus - N20.0 Chronic, Worsening - the LLP stone is now at the UPJ and measures 6x83mm. I have recommended he have ESWL for treatment. I reviewed the risks of ESWL including bleeding, infection, injury to the kidney or adjacent structures, failure to fragment the stone, need for ancillary procedures, thrombotic events, cardiac arrhythmias and sedation complications.   2   Microscopic hematuria - R31.21 Chronic, Stable   PLAN:            Medications Stop Meds: Aspir 81 1 PO Daily  Discontinue: 07/24/2020  - Reason: The medication cycle was completed.  Meloxicam 15 mg tablet 1 tablet PO Daily  Discontinue: 07/24/2020  - Reason: The medication cycle was completed.            Orders X-Rays: KUB          Schedule Return Visit/Planned Activity: Next Available Appointment - Schedule Surgery  Procedure: Unspecified Date - ESWL - 50590, left          Document Letter(s):  Created for Patient: Clinical Summary

## 2020-08-10 NOTE — Op Note (Signed)
See [facility] OP note scanned into chart. Also because of the size, density, location and other factors that cannot be anticipated I feel this will likely be a staged procedure. This fact supersedes any indication in the scanned [facility] operative note to the contrary.  

## 2020-08-11 ENCOUNTER — Encounter (HOSPITAL_BASED_OUTPATIENT_CLINIC_OR_DEPARTMENT_OTHER): Payer: Self-pay | Admitting: Urology

## 2020-08-11 ENCOUNTER — Other Ambulatory Visit: Payer: Self-pay | Admitting: Urology

## 2020-08-11 MED ORDER — TRAMADOL HCL 50 MG PO TABS: 50.0000 mg | ORAL_TABLET | Freq: Four times a day (QID) | ORAL | 0 refills | Status: DC | PRN

## 2020-08-14 ENCOUNTER — Other Ambulatory Visit: Payer: Self-pay

## 2020-08-14 ENCOUNTER — Emergency Department (HOSPITAL_COMMUNITY)
Admission: EM | Admit: 2020-08-14 | Discharge: 2020-08-14 | Disposition: A | Discharge status: Home / Self Care | Payer: Medicare Other | Attending: Emergency Medicine | Admitting: Emergency Medicine

## 2020-08-14 ENCOUNTER — Emergency Department (HOSPITAL_COMMUNITY): Payer: Medicare Other

## 2020-08-14 DIAGNOSIS — Z7982 Long term (current) use of aspirin: Secondary | ICD-10-CM | POA: Insufficient documentation

## 2020-08-14 DIAGNOSIS — R531 Weakness: Secondary | ICD-10-CM | POA: Diagnosis not present

## 2020-08-14 DIAGNOSIS — I10 Essential (primary) hypertension: Secondary | ICD-10-CM | POA: Diagnosis not present

## 2020-08-14 DIAGNOSIS — Z87891 Personal history of nicotine dependence: Secondary | ICD-10-CM | POA: Diagnosis not present

## 2020-08-14 DIAGNOSIS — Z79899 Other long term (current) drug therapy: Secondary | ICD-10-CM | POA: Insufficient documentation

## 2020-08-14 DIAGNOSIS — E860 Dehydration: Secondary | ICD-10-CM | POA: Diagnosis not present

## 2020-08-14 DIAGNOSIS — R110 Nausea: Secondary | ICD-10-CM | POA: Diagnosis not present

## 2020-08-14 DIAGNOSIS — I5032 Chronic diastolic (congestive) heart failure: Secondary | ICD-10-CM | POA: Insufficient documentation

## 2020-08-14 DIAGNOSIS — Z85828 Personal history of other malignant neoplasm of skin: Secondary | ICD-10-CM | POA: Insufficient documentation

## 2020-08-14 DIAGNOSIS — R109 Unspecified abdominal pain: Secondary | ICD-10-CM | POA: Diagnosis not present

## 2020-08-14 DIAGNOSIS — E119 Type 2 diabetes mellitus without complications: Secondary | ICD-10-CM | POA: Insufficient documentation

## 2020-08-14 DIAGNOSIS — I110 Hypertensive heart disease with heart failure: Secondary | ICD-10-CM | POA: Insufficient documentation

## 2020-08-14 DIAGNOSIS — I2510 Atherosclerotic heart disease of native coronary artery without angina pectoris: Secondary | ICD-10-CM | POA: Diagnosis not present

## 2020-08-14 DIAGNOSIS — Z7984 Long term (current) use of oral hypoglycemic drugs: Secondary | ICD-10-CM | POA: Insufficient documentation

## 2020-08-14 LAB — COMPREHENSIVE METABOLIC PANEL
ALT: 18 U/L (ref 0–44)
AST: 23 U/L (ref 15–41)
Albumin: 4.2 g/dL (ref 3.5–5.0)
Alkaline Phosphatase: 77 U/L (ref 38–126)
Anion gap: 10 (ref 5–15)
BUN: 12 mg/dL (ref 8–23)
CO2: 25 mmol/L (ref 22–32)
Calcium: 11 mg/dL — ABNORMAL HIGH (ref 8.9–10.3)
Chloride: 102 mmol/L (ref 98–111)
Creatinine, Ser: 1.15 mg/dL (ref 0.61–1.24)
GFR, Estimated: >60 mL/min (ref >60)
Glucose, Bld: 174 mg/dL — ABNORMAL HIGH (ref 70–99)
Potassium: 3.6 mmol/L (ref 3.5–5.1)
Sodium: 137 mmol/L (ref 135–145)
Total Bilirubin: 1 mg/dL (ref 0.3–1.2)
Total Protein: 8.4 g/dL — ABNORMAL HIGH (ref 6.5–8.1)

## 2020-08-14 LAB — URINALYSIS, ROUTINE W REFLEX MICROSCOPIC
Bilirubin Urine: NEGATIVE
Glucose, UA: 50 mg/dL — AB
Ketones, ur: NEGATIVE mg/dL
Leukocytes,Ua: NEGATIVE
Nitrite: NEGATIVE
Protein, ur: NEGATIVE mg/dL
RBC / HPF: >50 RBC/hpf — ABNORMAL HIGH (ref 0–5)
Specific Gravity, Urine: 1.009 (ref 1.005–1.030)
pH: 7 (ref 5.0–8.0)

## 2020-08-14 LAB — CBC
HCT: 38.3 % — ABNORMAL LOW (ref 39.0–52.0)
Hemoglobin: 12.1 g/dL — ABNORMAL LOW (ref 13.0–17.0)
MCH: 25.8 pg — ABNORMAL LOW (ref 26.0–34.0)
MCHC: 31.6 g/dL (ref 30.0–36.0)
MCV: 81.7 fL (ref 80.0–100.0)
Platelets: 225 10*3/uL (ref 150–400)
RBC: 4.69 MIL/uL (ref 4.22–5.81)
RDW: 15.1 % (ref 11.5–15.5)
WBC: 8.6 10*3/uL (ref 4.0–10.5)
nRBC: 0 % (ref 0.0–0.2)

## 2020-08-14 LAB — LIPASE, BLOOD: Lipase: 25 U/L (ref 11–51)

## 2020-08-14 MED ORDER — SODIUM CHLORIDE 0.9 % IV BOLUS
1000.0000 mL | Freq: Once | INTRAVENOUS | Status: AC
  Administered 2020-08-14: 1000 mL via INTRAVENOUS

## 2020-08-14 MED ORDER — ONDANSETRON 4 MG PO TBDP: ORAL_TABLET | ORAL | 0 refills | Status: DC

## 2020-08-14 MED ORDER — ONDANSETRON HCL 4 MG/2ML IJ SOLN
4.0000 mg | Freq: Once | INTRAMUSCULAR | Status: AC
  Administered 2020-08-14: 4 mg via INTRAVENOUS
  Filled 2020-08-14: qty 2

## 2020-08-14 NOTE — ED Notes (Signed)
Patient transported to X-ray 

## 2020-08-14 NOTE — ED Notes (Signed)
Notified provider about pt BP trending up.

## 2020-08-14 NOTE — ED Provider Notes (Signed)
[name] EMERGENCY DEPARTMENT Provider Note   CSN: [id number] Arrival date & time: 08/14/20  3220     History Chief Complaint  Patient presents with  . Abdominal Pain  . Weakness    Kenneth [name] is a 75 y.o. male.  Patient had lithotripsy last week.  He has not been eating or drinking since then.  He has felt nauseated but does not have a nausea medicine  The history is provided by the patient and medical records. No language interpreter was used.  Weakness Severity:  Moderate Onset quality:  Sudden Timing:  Constant Progression:  Waxing and waning Chronicity:  New Context: not alcohol use   Relieved by:  Nothing Worsened by:  Nothing Ineffective treatments:  None tried Associated symptoms: nausea   Associated symptoms: no abdominal pain, no chest pain, no cough, no diarrhea, no frequency, no headaches and no seizures        Past Medical History:  Diagnosis Date  . Arthritis   . CAD S/P percutaneous coronary angioplasty cardiologist-  dr Ellyn Hack    06/28/16 --PCI with Synergy DES 2.75 mm 20 mm-->m RCA; 07/10/16 PCI with Synergy DES 3.5 mm x 20 mm-->mLAD  . Chronic diastolic heart failure ([place])    followed by dr harding  . Chronic dryness of both eyes   . Diverticulosis of colon   . Dyspnea    with exertion   . Dyspnea on exertion   . Epiretinal membrane    "epiretinal attachment" (06/28/2016) 10-14-2017 per pt oringinally in both , now only unilateral   . GERD (gastroesophageal reflux disease)   . Heart murmur   . Hiatal hernia   . History of adenomatous polyp of colon   . History of concussion 08/23/2017   w/o loc--- per pt no residual  . History of kidney stones   . History of rheumatic fever 1958  . History of skin cancer    excision leg;  froze the left arm--- unsure BCC or SCC   . Hyperlipidemia   . Hypertension   . ILD (interstitial lung disease) ([place])    pulmologist-  dr Elsworth Soho--  secondary to methotrexate use --- last PFTs  04-25-2015  mild restriction  . Iron deficiency anemia   . Mild obstructive sleep apnea    10-04-2019 per pt  had a sleep study years ago , was told no cpap recommended pt denies   . Nephrolithiasis    CT 09-07-2019 bilateral nonobstructive stones  . Paroxysmal atrial fibrillation [facility]) cardiologist-  dr harding/  EP-- dr allred   a. 11/2003 Tikosyn initiated - subsequently d/c'd;  b. 2006 [place] for Afib @ [facility];  c. 09/2011 Echo: EF 60-65%, Gr 2 DD;  d. DCCV 10/2011 , 04/2012, 12/ 2014, & 02/ 2015;  e. RFCA 01-13-2014  . Plaque psoriasis 1969   followed by rheumatologist  and dermatologist @ [facility] in [place]  . Right ureteral stone   . S/P drug eluting coronary stent placement    06-28-2016  x1 to mRCA;  07-10-2016 x1 to mLAD  . Seasonal allergies   . Type 2 diabetes mellitus ([place])    followed by pcp  . Ureteral calculus, right   . Wears hearing aid in both ears     Patient Active Problem List   Diagnosis Date Noted  . Overweight (BMI 25.0-29.9) 12/29/2018  . Dyslipidemia associated with type 2 diabetes mellitus ([place]) 12/29/2018  . Atrial tachycardia ([place]) 02/12/2018  . Right ureteral stone 10/27/2017  .  Controlled type 2 diabetes mellitus without complication, without long-term current use of insulin ([place]) 05/12/2017  . Essential hypertension 05/12/2017  . Iron deficiency anemia 11/11/2016  . Hyperlipidemia with target low density lipoprotein (LDL) cholesterol less than 70 mg/dL 11/11/2016  . CAD S/P percutaneous coronary angioplasty 06/28/2016  . Coronary artery disease involving native coronary artery with angina pectoris ([place]) 06/27/2016  . Exertional dyspnea 06/27/2016  . Bronchitis 07/03/2015  . ILD (interstitial lung disease) ([place]) 01/27/2014  . Atypical atrial flutter ([place]) 01/13/2014  . Hx of colonic polyp 07/15/2013  . Diastolic CHF, chronic ([place]) 05/22/2013  . OTHER CHRONIC SINUSITIS 05/31/2010  . NEPHROLITHIASIS, HX OF 02/02/2008  . PERSISTENT DISORDER  INITIATING/MAINTAINING SLEEP 12/16/2007  . Obstructive sleep apnea 10/07/2007  . UNSPEC ALVEOLAR&PARIETOALVEOLAR PNEUMONOPATHY 10/02/2007  . Allergic rhinitis 07/24/2007  . Postoperative atrial fibrillation ([place]) 07/06/2007    Past Surgical History:  Procedure Laterality Date  . APPENDECTOMY  1996  . ATRIAL FIBRILLATION ABLATION N/A 01/13/2014   repeat PVI, also left atrial ablation performed with successfull ablation of LA flutter by Dr Rayann Heman  . ATRIAL FIBRILLATION ABLATION  08/2004   Dr Rolland Porter at [facility] [place])  . CARDIAC CATHETERIZATION  06-30-2003  dr Lia Foyer   no significant coronary obstruction, well preserved LVF, recurrent afib  . CARDIOVERSION N/A 05/22/2013   Procedure: CARDIOVERSION;  Surgeon: [name], MD;  Location: [place];  Service: Cardiovascular;  Laterality: N/A;  . CARDIOVERSION N/A 10/23/2011   Procedure: CARDIOVERSION;  Surgeon: Deboraha Sprang, MD;  Location: [facility] CATH LAB;  Service: Cardiovascular;  Laterality: N/A;  . CARDIOVERSION N/A 05/01/2012   Procedure: CARDIOVERSION;  Surgeon: Deboraha Sprang, MD;  Location: [facility] CATH LAB;  Service: Cardiovascular;  Laterality: N/A;  . CARPAL TUNNEL RELEASE Right 1980s  . CATARACT EXTRACTION W/ INTRAOCULAR LENS  IMPLANT, BILATERAL  2012  approx.  . CORONARY STENT INTERVENTION N/A 06/28/2016   Procedure: Coronary Stent Intervention;  Surgeon: Leonie Man, MD;  Location: [place] CV LAB;  Service: Cardiovascular;  Laterality: mRCA PCI -Synergy DES 2.75 m x 20 mm (3.1 mm)  . CORONARY STENT INTERVENTION N/A 07/10/2016   Procedure: Coronary Stent Intervention;  Surgeon: Leonie Man, MD;  Location: [place] CV LAB;  Service: Cardiovascular: mLAD PCI Synergy DES 3.5 mm x 20 mm)  . CYSTOSCOPY WITH RETROGRADE PYELOGRAM, URETEROSCOPY AND STENT PLACEMENT Right 10/16/2017   Procedure: CYSTOSCOPY WITH RIGHT RETROGRADE RIGHT URETEROSCOPY AND STONE BASKET EXTRACTION;  Surgeon: Irine Seal, MD;  Location: [facility];  Service: Urology;  Laterality: Right;  . CYSTOSCOPY WITH RETROGRADE PYELOGRAM, URETEROSCOPY AND STENT PLACEMENT Right 10/08/2019   Procedure: CYSTOSCOPY WITH URETEROSCOPY; RETROGRADE PYELOGRAM; STONE BASKET EXTRACTION;  STENT PLACEMENT;  Surgeon: Cleon Gustin, MD;  Location[facility];  Service: Urology;  Laterality: Right;  1 HR  . CYSTOSCOPY WITH RETROGRADE PYELOGRAM, URETEROSCOPY AND STENT PLACEMENT Right 03/28/2020   Procedure: CYSTOSCOPY WITH RIGHT RETROGRADE PYELOGRAM, URETEROSCOPY WITH HOLMIUM LASER AND STENT PLACEMENT;  Surgeon: Irine Seal, MD;  Location: [facility];  Service: Urology;  Laterality: Right;  . CYSTOSCOPY/RETROGRADE/URETEROSCOPY/STONE EXTRACTION WITH BASKET Right 10/27/2017   Procedure: CYSTOSCOPY/RETROGRADE/URETEROSCOPY/STONE EXTRACTION WITH BASKET/URETERAL STENT PLACEMENT and laser;  Surgeon: Irine Seal, MD;  Location: [facility];  Service: Urology;  Laterality: Right;  . EXTRACORPOREAL SHOCK WAVE LITHOTRIPSY Left 08/10/2020   Procedure: EXTRACORPOREAL SHOCK WAVE LITHOTRIPSY (ESWL);  Surgeon: Ardis Hughs, MD;  Location: [facility];  Service: Urology;  Laterality: Left;  . HOLMIUM  LASER APPLICATION Right 07/02/7122   Procedure: POSSIBLE HOLMIUM LASER APPLICATION;  Surgeon: Irine Seal, MD;  Location: [facility];  Service: Urology;  Laterality: Right;  . HOLMIUM LASER APPLICATION Right 08/13/996   Procedure: HOLMIUM LASER APPLICATION;  Surgeon: Cleon Gustin, MD;  Location[facility];  Service: Urology;  Laterality: Right;  . INTRAVASCULAR PRESSURE WIRE/FFR STUDY N/A 06/28/2016   Procedure: Intravascular Pressure Wire/FFR Study;  Surgeon: Leonie Man, MD;  Location: [place] CV LAB;  Service: Cardiovascular: FFR of mLAD ~65% lesion = 0.75 post --> Staged PCI  . NASAL SINUS SURGERY     x 2  . PROXIMAL INTERPHALANGEAL FUSION (PIP) Left 01-05-2001    dr graves   correction clawtoe and extensor tendon lengthening   . RIGHT/LEFT HEART CATH AND CORONARY ANGIOGRAPHY N/A 06/28/2016   Procedure: Right/Left Heart Cath and Coronary Angiography;  Surgeon: Leonie Man, MD;  Location: [place] CV LAB;  Service: Cardiovascular: mRCA 99% (TIMI 2), mLAD ~65% (FFR 0.75), OM3 55%. mild Pulm HTN. Mod LVEDP elevation. EF 55-65%.  Marland Kitchen ROTATOR CUFF REPAIR Bilateral last one 2014  . TONSILLECTOMY  age 55  . URETEROSCOPIC LASER LITHOTRIPSY STONE EXTRACTIONS/  STENT PLACEMENT Bilateral 09-10-2007   dr Jeffie Pollock  [facility]   per pt has had several prior ureteroscopic stone extraction since age 47 , last one 09-10-2007       Family History  Problem Relation Age of Onset  . Coronary artery disease Mother   . Breast cancer Mother   . Colon cancer Mother   . Heart disease Maternal Grandmother        MI  . Stroke Paternal Grandfather        CVA  . Coronary artery disease Paternal Grandfather   . Esophageal cancer Neg Hx   . Stomach cancer Neg Hx   . Rectal cancer Neg Hx     Social History   Tobacco Use  . Smoking status: Former Smoker    Years: 10.00    Types: Cigars    Quit date: 09/29/1997    Years since quitting: 22.8  . Smokeless tobacco: Former Systems developer    Types: Snuff, Chew  Vaping Use  . Vaping Use: Never used  Substance Use Topics  . Alcohol use: No  . Drug use: No    Home Medications Prior to Admission medications   Medication Sig Start Date End Date Taking? Authorizing Provider  ondansetron (ZOFRAN ODT) 4 MG disintegrating tablet 4mg  ODT q4 hours prn nausea/vomit 08/14/20  Yes Milton Ferguson, MD  acetaminophen (TYLENOL) 500 MG tablet Take 1,000 mg by mouth 2 (two) times daily as needed (for pain.).    [provider]  Adalimumab 40 MG/0.4ML PSKT Inject 40 mg into the skin every 14 (fourteen) days.     [provider]  albuterol (VENTOLIN HFA) 108 (90 Base) MCG/ACT inhaler Inhale 2 puffs into the lungs every 6 (six) hours as needed for wheezing or shortness of breath. 06/16/20   Rigoberto Noel, MD  aspirin EC 81 MG tablet Take 1 tablet (81 mg total) by mouth daily. Do not start until MAY 8,2020 Patient taking differently: Take 81 mg by mouth daily. 08/07/18   Leonie Man, MD  calcipotriene (DOVONOX) 0.005 % cream Apply 1 application topically 2 (two) times daily as needed (psoriasis).    [provider]  cetirizine (ZYRTEC) 10 MG tablet Take 10 mg by mouth daily.    [provider]  clobetasol cream (TEMOVATE)  7.98 % Apply 1 application topically 2 (two) times daily as needed (for psoriasis).    [provider]  diltiazem (CARDIZEM CD) 180 MG 24 hr capsule Take 1 capsule by mouth once daily 07/10/20   Leonie Man, MD  doxycycline (VIBRA-TABS) 100 MG tablet Take 1 tablet (100 mg total) by mouth daily. 07/17/20   Rigoberto Noel, MD  ferrous sulfate 325 (65 FE) MG tablet Take 325 mg by mouth 2 (two) times a week. Wednesday's and Saturday's    [provider]  gabapentin (NEURONTIN) 300 MG capsule Take 300-600 mg by mouth See admin instructions. Take 300 mg in the morning and 600 mg in th evening    [provider]  ketorolac (TORADOL) 10 MG tablet Take 1 tablet (10 mg total) by mouth every 6 (six) hours as needed for severe pain. 02/03/20   Enrique Sack, FNP  metFORMIN (GLUCOPHAGE-XR) 500 MG 24 hr tablet Take 500 mg by mouth 2 (two) times daily.    [provider]  oxybutynin (DITROPAN) 5 MG tablet Take 1 tablet (5 mg total) by mouth every 8 (eight) hours as needed for bladder spasms. 03/28/20   Irine Seal, MD  phenazopyridine (PYRIDIUM) 200 MG tablet Take 1 tablet (200 mg total) by mouth 3 (three) times daily as needed for pain (prn burning). 03/28/20   Irine Seal, MD  Polyethyl Glycol-Propyl Glycol (SYSTANE OP) Place 1 drop into both eyes daily.    [provider]  predniSONE (DELTASONE) 5 MG tablet Take 10 mg on M, W, F, and 5 mg on other days. 06/16/20   Rigoberto Noel, MD  rosuvastatin (CRESTOR) 20 MG tablet Take  1 tablet (20 mg total) by mouth every evening. 04/03/20 07/02/20  Ledora Bottcher, PA  tamsulosin (FLOMAX) 0.4 MG CAPS capsule Take 0.4 mg by mouth every evening.     [provider]  traMADol (ULTRAM) 50 MG tablet Take 1-2 tablets (50-100 mg total) by mouth every 6 (six) hours as needed for moderate pain. 08/11/20   Ardis Hughs, MD    Allergies    Hydrocodone, Other, Oxycodone, Pseudoephedrine, Tramadol, and Gemfibrozil  Review of Systems   Review of Systems  Constitutional: Negative for appetite change and fatigue.  HENT: Negative for congestion, ear discharge and sinus pressure.   Eyes: Negative for discharge.  Respiratory: Negative for cough.   Cardiovascular: Negative for chest pain.  Gastrointestinal: Positive for nausea. Negative for abdominal pain and diarrhea.  Genitourinary: Negative for frequency and hematuria.  Musculoskeletal: Negative for back pain.  Skin: Negative for rash.  Neurological: Positive for weakness. Negative for seizures and headaches.  Psychiatric/Behavioral: Negative for hallucinations.    Physical Exam Updated Vital Signs BP (!) 162/71   Pulse 79   Temp 98.1 F (36.7 C) (Oral)   Resp (!) 22   Ht 6\' 3"  (1.905 m)   Wt 102 kg   SpO2 99%   BMI 28.11 kg/m   Physical Exam Vitals and nursing note reviewed.  Constitutional:      Appearance: He is well-developed.  HENT:     Head: Normocephalic.     Nose: Nose normal.  Eyes:     General: No scleral icterus.    Conjunctiva/sclera: Conjunctivae normal.  Neck:     Thyroid: No thyromegaly.  Cardiovascular:     Rate and Rhythm: Normal rate and regular rhythm.     Heart sounds: No murmur heard. No friction rub. No gallop.   Pulmonary:  Breath sounds: No stridor. No wheezing or rales.  Chest:     Chest wall: No tenderness.  Abdominal:     General: There is no distension.     Tenderness: There is no abdominal tenderness. There is no rebound.  Musculoskeletal:        General:  Normal range of motion.     Cervical back: Neck supple.  Lymphadenopathy:     Cervical: No cervical adenopathy.  Skin:    Findings: No erythema or rash.  Neurological:     Mental Status: He is alert and oriented to person, place, and time.     Motor: No abnormal muscle tone.     Coordination: Coordination normal.  Psychiatric:        Behavior: Behavior normal.     ED Results / Procedures / Treatments   Labs (all labs ordered are listed, but only abnormal results are displayed) Labs Reviewed  COMPREHENSIVE METABOLIC PANEL - Abnormal; Notable for the following components:      Result Value   Glucose, Bld 174 (*)    Calcium 11.0 (*)    Total Protein 8.4 (*)    All other components within normal limits  CBC - Abnormal; Notable for the following components:   Hemoglobin 12.1 (*)    HCT 38.3 (*)    MCH 25.8 (*)    All other components within normal limits  URINALYSIS, ROUTINE W REFLEX MICROSCOPIC - Abnormal; Notable for the following components:   APPearance HAZY (*)    Glucose, UA 50 (*)    Hgb urine dipstick MODERATE (*)    RBC / HPF >50 (*)    Bacteria, UA RARE (*)    All other components within normal limits  LIPASE, BLOOD    EKG None  Radiology DG Abdomen 1 View  Result Date: 08/14/2020 CLINICAL DATA:  Left flank pain. History of kidney stones and lithotripsy. EXAM: ABDOMEN - 1 VIEW COMPARISON:  08/11/2010 and older studies.  CT, 03/15/2020. FINDINGS: No convincing ureteral stone. Small stone projects over the lower pole of the left kidney. The larger stone seen in the left kidney on the prior exam are no longer visualized. This small stone may reflect a residual component of the larger stone seen on the prior study. No visualized right intrarenal stones. Aorto iliac atherosclerotic vascular calcifications noted. Soft tissues otherwise unremarkable. Normal bowel gas pattern. IMPRESSION: 1. No evidence of a ureteral stone. 2. There is a small stone projecting in the lower  pole the left kidney likely the residual of the previously seen larger lower pole stone following lithotripsy. No other visualized intrarenal stones. Electronically Signed   By: Lajean Manes M.D.   On: 08/14/2020 10:57    Procedures Procedures   Medications Ordered in ED Medications  sodium chloride 0.9 % bolus 1,000 mL (0 mLs Intravenous Stopped 08/14/20 1226)  ondansetron (ZOFRAN) injection 4 mg (4 mg Intravenous Given 08/14/20 1029)  sodium chloride 0.9 % bolus 1,000 mL (0 mLs Intravenous Stopped 08/14/20 1330)    ED Course  I have reviewed the triage vital signs and the nursing notes.  Pertinent labs & imaging results that were available during my care of the patient were reviewed by me and considered in my medical decision making (see chart for details).    MDM Rules/Calculators/A&P                          Patient with dehydration and nausea from recent lithotripsy.  Labs and x-rays unremarkable.  Patient improved with fluids and Zofran.  He will be sent home with Zofran and will follow up with urology as planned Final Clinical Impression(s) / ED Diagnoses Final diagnoses:  Dehydration    Rx / DC Orders ED Discharge Orders         Ordered    ondansetron (ZOFRAN ODT) 4 MG disintegrating tablet        08/14/20 1409           Milton Ferguson, MD 08/14/20 1414

## 2020-08-14 NOTE — ED Notes (Signed)
Provider approved pt to have some water.

## 2020-08-14 NOTE — Discharge Instructions (Addendum)
Drink plenty of fluids.  Follow-up with your urologist as planned.  Return to the emergency department if needed

## 2020-08-14 NOTE — ED Notes (Signed)
Wife is at the bedside.

## 2020-08-14 NOTE — ED Notes (Signed)
Pt ambulated to the restroom.

## 2020-08-14 NOTE — ED Triage Notes (Signed)
Pt presents to the ED s/p Lithotripsy on Thursday to remove kidney stones. Pt states since then he has had no appetite, nauseous, and weak and just doesn't feel right.

## 2020-08-14 NOTE — ED Notes (Signed)
Got patient into a gown on the monitor patient is resting with call bell in reach 

## 2020-08-16 DIAGNOSIS — N201 Calculus of ureter: Secondary | ICD-10-CM | POA: Diagnosis not present

## 2020-08-16 DIAGNOSIS — I700 Atherosclerosis of aorta: Secondary | ICD-10-CM | POA: Diagnosis not present

## 2020-08-16 DIAGNOSIS — M5136 Other intervertebral disc degeneration, lumbar region: Secondary | ICD-10-CM | POA: Diagnosis not present

## 2020-08-16 DIAGNOSIS — N132 Hydronephrosis with renal and ureteral calculous obstruction: Secondary | ICD-10-CM | POA: Diagnosis not present

## 2020-08-16 DIAGNOSIS — K5750 Diverticulosis of both small and large intestine without perforation or abscess without bleeding: Secondary | ICD-10-CM | POA: Diagnosis not present

## 2020-08-24 DIAGNOSIS — N201 Calculus of ureter: Secondary | ICD-10-CM | POA: Diagnosis not present

## 2020-09-12 ENCOUNTER — Encounter: Payer: Self-pay | Admitting: Primary Care

## 2020-09-12 ENCOUNTER — Ambulatory Visit (INDEPENDENT_AMBULATORY_CARE_PROVIDER_SITE_OTHER): Payer: Medicare Other | Admitting: Primary Care

## 2020-09-12 ENCOUNTER — Other Ambulatory Visit: Payer: Self-pay

## 2020-09-12 VITALS — BP 128/74 | HR 80 | Temp 98.5°F | Ht 75.0 in | Wt 231.6 lb

## 2020-09-12 DIAGNOSIS — R0602 Shortness of breath: Secondary | ICD-10-CM

## 2020-09-12 DIAGNOSIS — J849 Interstitial pulmonary disease, unspecified: Secondary | ICD-10-CM | POA: Diagnosis not present

## 2020-09-12 LAB — CBC WITH DIFFERENTIAL/PLATELET
Basophils Absolute: 0.1 10*3/uL (ref 0.0–0.1)
Basophils Relative: 0.6 % (ref 0.0–3.0)
Eosinophils Absolute: 0.1 10*3/uL (ref 0.0–0.7)
Eosinophils Relative: 1.1 % (ref 0.0–5.0)
HCT: 31.6 % — ABNORMAL LOW (ref 39.0–52.0)
Hemoglobin: 10.4 g/dL — ABNORMAL LOW (ref 13.0–17.0)
Lymphocytes Relative: 13.2 % (ref 12.0–46.0)
Lymphs Abs: 1.4 10*3/uL (ref 0.7–4.0)
MCHC: 32.8 g/dL (ref 30.0–36.0)
MCV: 79.5 fl (ref 78.0–100.0)
Monocytes Absolute: 0.7 10*3/uL (ref 0.1–1.0)
Monocytes Relative: 6.8 % (ref 3.0–12.0)
Neutro Abs: 8.2 10*3/uL — ABNORMAL HIGH (ref 1.4–7.7)
Neutrophils Relative %: 78.3 % — ABNORMAL HIGH (ref 43.0–77.0)
Platelets: 176 10*3/uL (ref 150.0–400.0)
RBC: 3.98 Mil/uL — ABNORMAL LOW (ref 4.22–5.81)
RDW: 16.8 % — ABNORMAL HIGH (ref 11.5–15.5)
WBC: 10.4 10*3/uL (ref 4.0–10.5)

## 2020-09-12 LAB — BRAIN NATRIURETIC PEPTIDE: Pro B Natriuretic peptide (BNP): 82 pg/mL (ref 0.0–100.0)

## 2020-09-12 NOTE — Patient Instructions (Addendum)
CT scan in March showed unchanged pattern of scarring compared to July 2021  Continue Prednisone 5mg  daily / take 10mg  MWF  Use albuterol inhaler prior to heavy activity   Orders: PFTs  Labs today  Follow-up: July with Elsworth Soho and PFTs

## 2020-09-12 NOTE — Progress Notes (Signed)
@Patient  ID: Kenneth [name], male    DOB: Sep 05, 1945, 75 y.o.   MRN: [id number]  Chief Complaint  Patient presents with  . Follow-up    Shortness of breath with exertion    Referring provider: [facility]  HPI: 75 year old male, former smoker quit in June 1999 (10-pack-year history).  Past medical history significant for ILD, atypical atrial flutter, chronic diastolic heart failure, coronary artery disease, type 2 diabetes.  Patient of Dr. Elsworth Soho seen in March 2022.  Patient maintained on prn albuterol and daily prednisone.  09/12/2020 Patient presents today for 34-month follow-up. HRCT 07/03/20 showed parenchymal pattern of ILD unchanged from July 2021. He reports worsening dyspnea symptoms. He leads a very active lifestyle and states that he will be outside trimming shrubs and be "totally out of breath". He can not walk far distances. He does not have the stamina that he is accustom to having. He is taking prednisone as prescribed. He is on 5mg  prednisone daily, and he increase 10mg  MWF only if he needs it. He tells me that he is not a pill person, he almost prefers to take it as needed. He notices improvement within 20 mins. He has not tried using his rescue inhaler. He can tell when his heart will go out of rhythm and he will "hold on to his neighbors electric fence". He is also not eating well, reports a decreased appetite and nausea. He is dealing with a lot of stress in his life d/t some trauma from his past job in the [employer] and caring for his wife at home.   Allergies  Allergen Reactions  . Hydrocodone Shortness Of Breath    In combination with decongestants  . Other Palpitations and Other (See Comments)    ALL DECONGESTANTS - CAUSE PALPITATIONS; THROWS HEART RHYTHM OUT OF BALANCE  . Oxycodone Shortness Of Breath  . Pseudoephedrine Palpitations  . Tramadol Shortness Of Breath and Rash    flushing  . Gemfibrozil Other (See Comments)    Dizziness      Immunization  History  Administered Date(s) Administered  . Fluad Quad(high Dose 65+) 11/30/2018, 02/01/2020  . H1N1 03/18/2008  . Influenza Split 03/27/2011, 01/17/2017  . Influenza Whole 02/24/2008, 01/12/2009  . Influenza, High Dose Seasonal PF 01/15/2016, 12/30/2016, 01/26/2018  . Influenza,inj,Quad PF,6+ Mos 01/03/2014  . Influenza-Unspecified 01/23/2015  . Moderna Sars-Covid-2 Vaccination 06/03/2019, 07/01/2019, 12/09/2019, 07/18/2020  . Pneumococcal Polysaccharide-23 11/30/2018, 02/28/2020  . Pneumococcal-Unspecified 01/15/2016  . Tdap 02/01/2020    Past Medical History:  Diagnosis Date  . Arthritis   . CAD S/P percutaneous coronary angioplasty cardiologist-  dr Ellyn Hack    06/28/16 --PCI with Synergy DES 2.75 mm 20 mm-->m RCA; 07/10/16 PCI with Synergy DES 3.5 mm x 20 mm-->mLAD  . Chronic diastolic heart failure ([place])    followed by dr harding  . Chronic dryness of both eyes   . Diverticulosis of colon   . Dyspnea    with exertion   . Dyspnea on exertion   . Epiretinal membrane    "epiretinal attachment" (06/28/2016) 10-14-2017 per pt oringinally in both , now only unilateral   . GERD (gastroesophageal reflux disease)   . Heart murmur   . Hiatal hernia   . History of adenomatous polyp of colon   . History of concussion 08/23/2017   w/o loc--- per pt no residual  . History of kidney stones   . History of rheumatic fever 1958  . History of skin cancer    excision leg;  froze the left arm--- unsure BCC or SCC   . Hyperlipidemia   . Hypertension   . ILD (interstitial lung disease) ([place])    pulmologist-  dr Elsworth Soho--  secondary to methotrexate use --- last PFTs 04-25-2015  mild restriction  . Iron deficiency anemia   . Mild obstructive sleep apnea    10-04-2019 per pt  had a sleep study years ago , was told no cpap recommended pt denies   . Nephrolithiasis    CT 09-07-2019 bilateral nonobstructive stones  . Paroxysmal atrial fibrillation [facility]) cardiologist-  dr harding/  EP-- dr allred    a. 11/2003 Tikosyn initiated - subsequently d/c'd;  b. 2006 [place] for Afib @ [facility];  c. 09/2011 Echo: EF 60-65%, Gr 2 DD;  d. DCCV 10/2011 , 04/2012, 12/ 2014, & 02/ 2015;  e. RFCA 01-13-2014  . Plaque psoriasis 1969   followed by rheumatologist  and dermatologist @ [facility] in [place]  . Right ureteral stone   . S/P drug eluting coronary stent placement    06-28-2016  x1 to mRCA;  07-10-2016 x1 to mLAD  . Seasonal allergies   . Type 2 diabetes mellitus ([place])    followed by pcp  . Ureteral calculus, right   . Wears hearing aid in both ears     Tobacco History: Social History   Tobacco Use  Smoking Status Former Smoker  . Years: 10.00  . Types: Cigars  . Quit date: 09/29/1997  . Years since quitting: 22.9  Smokeless Tobacco Former Systems developer  . Types: Snuff, Chew  Tobacco Comment   4-5 per day at his max   Counseling given: Not Answered Comment: 4-5 per day at his max   Outpatient Medications Prior to Visit  Medication Sig Dispense Refill  . acetaminophen (TYLENOL) 500 MG tablet Take 1,000 mg by mouth 2 (two) times daily as needed (for pain.).    . Adalimumab 40 MG/0.4ML PSKT Inject 40 mg into the skin every 14 (fourteen) days.     Marland Kitchen albuterol (VENTOLIN HFA) 108 (90 Base) MCG/ACT inhaler Inhale 2 puffs into the lungs every 6 (six) hours as needed for wheezing or shortness of breath. 8 g 6  . aspirin EC 81 MG tablet Take 1 tablet (81 mg total) by mouth daily. Do not start until MAY 8,2020 (Patient taking differently: Take 81 mg by mouth daily.) 90 tablet 3  . calcipotriene (DOVONOX) 0.005 % cream Apply 1 application topically 2 (two) times daily as needed (psoriasis).    . cetirizine (ZYRTEC) 10 MG tablet Take 10 mg by mouth daily.    . clobetasol cream (TEMOVATE) 1.61 % Apply 1 application topically 2 (two) times daily as needed (for psoriasis).    Marland Kitchen diltiazem (CARDIZEM CD) 180 MG 24 hr capsule Take 1 capsule by mouth once daily 90 capsule 1  . ferrous sulfate 325 (65 FE) MG tablet Take  325 mg by mouth 2 (two) times a week. Wednesday's and Saturday's    . gabapentin (NEURONTIN) 300 MG capsule Take 300-600 mg by mouth See admin instructions. Take 300 mg in the morning and 600 mg in th evening    . ketorolac (TORADOL) 10 MG tablet Take 1 tablet (10 mg total) by mouth every 6 (six) hours as needed for severe pain. 20 tablet 0  . metFORMIN (GLUCOPHAGE-XR) 500 MG 24 hr tablet Take 500 mg by mouth 2 (two) times daily.    . ondansetron (ZOFRAN ODT) 4 MG disintegrating tablet 4mg  ODT q4 hours prn nausea/vomit  12 tablet 0  . oxybutynin (DITROPAN) 5 MG tablet Take 1 tablet (5 mg total) by mouth every 8 (eight) hours as needed for bladder spasms. 15 tablet 1  . phenazopyridine (PYRIDIUM) 200 MG tablet Take 1 tablet (200 mg total) by mouth 3 (three) times daily as needed for pain (prn burning). 10 tablet 0  . Polyethyl Glycol-Propyl Glycol (SYSTANE OP) Place 1 drop into both eyes daily.    . predniSONE (DELTASONE) 5 MG tablet Take 10 mg on M, W, F, and 5 mg on other days. 60 tablet 3  . tamsulosin (FLOMAX) 0.4 MG CAPS capsule Take 0.4 mg by mouth every evening.     . traMADol (ULTRAM) 50 MG tablet Take 1-2 tablets (50-100 mg total) by mouth every 6 (six) hours as needed for moderate pain. 10 tablet 0  . doxycycline (VIBRA-TABS) 100 MG tablet Take 1 tablet (100 mg total) by mouth daily. 7 tablet 0  . rosuvastatin (CRESTOR) 20 MG tablet Take 1 tablet (20 mg total) by mouth every evening. 90 tablet 3   Facility-Administered Medications Prior to Visit  Medication Dose Route Frequency Provider Last Rate Last Admin  . lidocaine (cardiac) 100 mg/57ml (XYLOCAINE) 20 MG/ML injection 2%    Anesthesia Intra-op Flowers, Rokoshi T, CRNA   60 mg at 03/25/13 1617  . propofol (DIPRIVAN) 10 mg/mL bolus/IV push    Anesthesia Intra-op Flowers, Rokoshi T, CRNA   90 mg at 03/25/13 1617    Review of Systems  Review of Systems  Constitutional: Negative.   Respiratory: Positive for shortness of breath.      Physical Exam  BP 128/74 (BP Location: Left Arm, Cuff Size: Normal)   Pulse 80   Temp 98.5 F (36.9 C) (Temporal)   Ht 6\' 3"  (1.905 m)   Wt 231 lb 9.6 oz (105.1 kg)   SpO2 99% Comment: RA  BMI 28.95 kg/m  Physical Exam Constitutional:      Appearance: Normal appearance.  HENT:     Head: Normocephalic and atraumatic.  Cardiovascular:     Rate and Rhythm: Normal rate and regular rhythm.     Heart sounds: Murmur heard.    Pulmonary:     Breath sounds: Rales present. No wheezing.     Comments: Rales at bilateral bases R>L Neurological:     General: No focal deficit present.     Mental Status: He is alert and oriented to person, place, and time. Mental status is at baseline.  Psychiatric:        Mood and Affect: Mood normal.        Behavior: Behavior normal.        Thought Content: Thought content normal.        Judgment: Judgment normal.      Lab Results:  CBC    Component Value Date/Time   WBC 10.4 09/12/2020 0950   RBC 3.98 (L) 09/12/2020 0950   HGB 10.4 (L) 09/12/2020 0950   HGB 8.8 (L) 08/06/2018 1108   HCT 31.6 (L) 09/12/2020 0950   HCT 29.2 (L) 08/06/2018 1108   PLT 176.0 09/12/2020 0950   PLT 250 08/06/2018 1108   MCV 79.5 09/12/2020 0950   MCV 83 08/06/2018 1108   MCH 25.8 (L) 08/14/2020 0948   MCHC 32.8 09/12/2020 0950   RDW 16.8 (H) 09/12/2020 0950   RDW 14.5 08/06/2018 1108   LYMPHSABS 1.4 09/12/2020 0950   LYMPHSABS 1.3 08/06/2018 1108   MONOABS 0.7 09/12/2020 0950   EOSABS 0.1 09/12/2020 0950  EOSABS 0.0 08/06/2018 1108   BASOSABS 0.1 09/12/2020 0950   BASOSABS 0.0 08/06/2018 1108    BMET    Component Value Date/Time   NA 137 08/14/2020 0948   NA 143 05/12/2017 1130   K 3.6 08/14/2020 0948   CL 102 08/14/2020 0948   CO2 25 08/14/2020 0948   GLUCOSE 174 (H) 08/14/2020 0948   BUN 12 08/14/2020 0948   BUN 14 05/12/2017 1130   CREATININE 1.15 08/14/2020 0948   CALCIUM 11.0 (H) 08/14/2020 0948   GFRNONAA >60 08/14/2020 0948    GFRAA >60 09/07/2019 1744    BNP No results found for: BNP  ProBNP    Component Value Date/Time   PROBNP 82.0 09/12/2020 0950    Imaging: DG Abdomen 1 View  Result Date: 08/14/2020 CLINICAL DATA:  Left flank pain. History of kidney stones and lithotripsy. EXAM: ABDOMEN - 1 VIEW COMPARISON:  08/11/2010 and older studies.  CT, 03/15/2020. FINDINGS: No convincing ureteral stone. Small stone projects over the lower pole of the left kidney. The larger stone seen in the left kidney on the prior exam are no longer visualized. This small stone may reflect a residual component of the larger stone seen on the prior study. No visualized right intrarenal stones. Aorto iliac atherosclerotic vascular calcifications noted. Soft tissues otherwise unremarkable. Normal bowel gas pattern. IMPRESSION: 1. No evidence of a ureteral stone. 2. There is a small stone projecting in the lower pole the left kidney likely the residual of the previously seen larger lower pole stone following lithotripsy. No other visualized intrarenal stones. Electronically Signed   By: Lajean Manes M.D.   On: 08/14/2020 10:57     Assessment & Plan:   ILD (interstitial lung disease) ([place]) - Patient reports worsening dyspnea symptoms with associated fatigue and poor appetite. He often goes in and out of afib. Heart rate appears to be in normal rhythm on exam today. No evidence of acute CHF exacerbation. HRCT in March 2022 showed pattern of ILD is unchanged from July 2021. Needs PFTs, last attempted in 2018 but unable to complete d/t coughing. Continue 5mg  prednisone, alternating with 10mg  MWF. Advise he use albuterol rescue inhaler prior to strenuous activity. FU in 1 month with Dr. Elsworth Soho.    Martyn Ehrich, NP 09/12/2020

## 2020-09-12 NOTE — Progress Notes (Addendum)
Cardiology Clinic Note   Patient Name: Kenneth [name] Date of Encounter: 09/13/2020  Primary Care Provider:  Clinic, [facility] Primary Cardiologist:  [name], MD  Patient Profile    Kenneth [name] presents the clinic today for evaluation of his increased DOE.  Past Medical History    Past Medical History:  Diagnosis Date  . Arthritis   . CAD S/P percutaneous coronary angioplasty cardiologist-  dr Ellyn Hack    06/28/16 --PCI with Synergy DES 2.75 mm 20 mm-->m RCA; 07/10/16 PCI with Synergy DES 3.5 mm x 20 mm-->mLAD  . Chronic diastolic heart failure ([place])    followed by dr harding  . Chronic dryness of both eyes   . Diverticulosis of colon   . Dyspnea    with exertion   . Dyspnea on exertion   . Epiretinal membrane    "epiretinal attachment" (06/28/2016) 10-14-2017 per pt oringinally in both , now only unilateral   . GERD (gastroesophageal reflux disease)   . Heart murmur   . Hiatal hernia   . History of adenomatous polyp of colon   . History of concussion 08/23/2017   w/o loc--- per pt no residual  . History of kidney stones   . History of rheumatic fever 1958  . History of skin cancer    excision leg;  froze the left arm--- unsure BCC or SCC   . Hyperlipidemia   . Hypertension   . ILD (interstitial lung disease) ([place])    pulmologist-  dr Elsworth Soho--  secondary to methotrexate use --- last PFTs 04-25-2015  mild restriction  . Iron deficiency anemia   . Mild obstructive sleep apnea    10-04-2019 per pt  had a sleep study years ago , was told no cpap recommended pt denies   . Nephrolithiasis    CT 09-07-2019 bilateral nonobstructive stones  . Paroxysmal atrial fibrillation [facility]) cardiologist-  dr harding/  EP-- dr allred   a. 11/2003 Tikosyn initiated - subsequently d/c'd;  b. 2006 [place] for Afib @ [place];  c. 09/2011 Echo: EF 60-65%, Gr 2 DD;  d. DCCV 10/2011 , 04/2012, 12/ 2014, & 02/ 2015;  e. RFCA 01-13-2014  . Plaque psoriasis 1969   followed by rheumatologist  and  dermatologist @ [facility] in [place]  . Right ureteral stone   . S/P drug eluting coronary stent placement    06-28-2016  x1 to mRCA;  07-10-2016 x1 to mLAD  . Seasonal allergies   . Type 2 diabetes mellitus ([place])    followed by pcp  . Ureteral calculus, right   . Wears hearing aid in both ears    Past Surgical History:  Procedure Laterality Date  . APPENDECTOMY  1996  . ATRIAL FIBRILLATION ABLATION N/A 01/13/2014   repeat PVI, also left atrial ablation performed with successfull ablation of LA flutter by Dr Rayann Heman  . ATRIAL FIBRILLATION ABLATION  08/2004   Dr Rolland Porter at [facility] [place])  . CARDIAC CATHETERIZATION  06-30-2003  dr Lia Foyer   no significant coronary obstruction, well preserved LVF, recurrent afib  . CARDIOVERSION N/A 05/22/2013   Procedure: CARDIOVERSION;  Surgeon: [name], MD;  Location: [place];  Service: Cardiovascular;  Laterality: N/A;  . CARDIOVERSION N/A 10/23/2011   Procedure: CARDIOVERSION;  Surgeon: Deboraha Sprang, MD;  Location: [facility] CATH LAB;  Service: Cardiovascular;  Laterality: N/A;  . CARDIOVERSION N/A 05/01/2012   Procedure: CARDIOVERSION;  Surgeon: Deboraha Sprang, MD;  Location: [facility] CATH LAB;  Service: Cardiovascular;  Laterality: N/A;  . CARPAL TUNNEL RELEASE Right 1980s  . CATARACT EXTRACTION W/ INTRAOCULAR LENS  IMPLANT, BILATERAL  2012  approx.  . CORONARY STENT INTERVENTION N/A 06/28/2016   Procedure: Coronary Stent Intervention;  Surgeon: Leonie Man, MD;  Location: [place] CV LAB;  Service: Cardiovascular;  Laterality: mRCA PCI -Synergy DES 2.75 m x 20 mm (3.1 mm)  . CORONARY STENT INTERVENTION N/A 07/10/2016   Procedure: Coronary Stent Intervention;  Surgeon: Leonie Man, MD;  Location: [place] CV LAB;  Service: Cardiovascular: mLAD PCI Synergy DES 3.5 mm x 20 mm)  . CYSTOSCOPY WITH RETROGRADE PYELOGRAM, URETEROSCOPY AND STENT PLACEMENT Right 10/16/2017   Procedure: CYSTOSCOPY WITH RIGHT RETROGRADE RIGHT URETEROSCOPY AND STONE  BASKET EXTRACTION;  Surgeon: Irine Seal, MD;  Location: [facility];  Service: Urology;  Laterality: Right;  . CYSTOSCOPY WITH RETROGRADE PYELOGRAM, URETEROSCOPY AND STENT PLACEMENT Right 10/08/2019   Procedure: CYSTOSCOPY WITH URETEROSCOPY; RETROGRADE PYELOGRAM; STONE BASKET EXTRACTION;  STENT PLACEMENT;  Surgeon: Cleon Gustin, MD;  Location: [facility];  Service: Urology;  Laterality: Right;  1 HR  . CYSTOSCOPY WITH RETROGRADE PYELOGRAM, URETEROSCOPY AND STENT PLACEMENT Right 03/28/2020   Procedure: CYSTOSCOPY WITH RIGHT RETROGRADE PYELOGRAM, URETEROSCOPY WITH HOLMIUM LASER AND STENT PLACEMENT;  Surgeon: Irine Seal, MD;  Location: [facility];  Service: Urology;  Laterality: Right;  . CYSTOSCOPY/RETROGRADE/URETEROSCOPY/STONE EXTRACTION WITH BASKET Right 10/27/2017   Procedure: CYSTOSCOPY/RETROGRADE/URETEROSCOPY/STONE EXTRACTION WITH BASKET/URETERAL STENT PLACEMENT and laser;  Surgeon: Irine Seal, MD;  Location: [facility];  Service: Urology;  Laterality: Right;  . EXTRACORPOREAL SHOCK WAVE LITHOTRIPSY Left 08/10/2020   Procedure: EXTRACORPOREAL SHOCK WAVE LITHOTRIPSY (ESWL);  Surgeon: Ardis Hughs, MD;  Location: [facility];  Service: Urology;  Laterality: Left;  . HOLMIUM LASER APPLICATION Right 2/77/4128   Procedure: POSSIBLE HOLMIUM LASER APPLICATION;  Surgeon: Irine Seal, MD;  Location: [facility];  Service: Urology;  Laterality: Right;  . HOLMIUM LASER APPLICATION Right 10/13/6765   Procedure: HOLMIUM LASER APPLICATION;  Surgeon: Cleon Gustin, MD;  Location: [facility];  Service: Urology;  Laterality: Right;  . INTRAVASCULAR PRESSURE WIRE/FFR STUDY N/A 06/28/2016   Procedure: Intravascular Pressure Wire/FFR Study;  Surgeon: Leonie Man, MD;  Location: [place] CV LAB;  Service: Cardiovascular: FFR of mLAD ~65% lesion = 0.75 post --> Staged PCI  . NASAL SINUS SURGERY     x 2  . PROXIMAL INTERPHALANGEAL FUSION (PIP) Left  01-05-2001    dr graves   correction clawtoe and extensor tendon lengthening  . RIGHT/LEFT HEART CATH AND CORONARY ANGIOGRAPHY N/A 06/28/2016   Procedure: Right/Left Heart Cath and Coronary Angiography;  Surgeon: Leonie Man, MD;  Location: [place] CV LAB;  Service: Cardiovascular: mRCA 99% (TIMI 2), mLAD ~65% (FFR 0.75), OM3 55%. mild Pulm HTN. Mod LVEDP elevation. EF 55-65%.  Marland Kitchen ROTATOR CUFF REPAIR Bilateral last one 2014  . TONSILLECTOMY  age 43  . URETEROSCOPIC LASER LITHOTRIPSY STONE EXTRACTIONS/  STENT PLACEMENT Bilateral 09-10-2007   dr Jeffie Pollock  [facility]   per pt has had several prior ureteroscopic stone extraction since age 27 , last one 09-10-2007    Allergies  Allergies  Allergen Reactions  . Hydrocodone Shortness Of Breath    In combination with decongestants  . Other Palpitations and Other (See Comments)    ALL DECONGESTANTS - CAUSE PALPITATIONS; THROWS HEART RHYTHM OUT OF BALANCE  . Oxycodone Shortness Of Breath  . Pseudoephedrine Palpitations  . Tramadol Shortness Of Breath and Rash  flushing  . Gemfibrozil Other (See Comments)    Dizziness      History of Present Illness    Kenneth [name] has a PMH of diastolic CHF, atypical atrial flutter, coronary artery disease status post PCI with DES to his RCA 3/18 and staged cardiac catheterization for PCI of mid LAD 4/18.  His PMH also includes hyperlipidemia, essential hypertension, psoriatic arthritis and recurrent nephrolithiasis.  He was last seen by Dr. Ellyn Hack 12/22/2019.  During that time he reported he was doing well from cardiac standpoint.  He denied chest pain with rest and on exertion.  He was walking 1-2 miles daily and very active working [place] his [place].  He plans to build homes for his children and all of his grandchildren on his property.  He was also busy mowing, weed whacking, and almost walking.  He reported he was under an increased amount of stress acting as the primary caregiver for his wife who  has cancer.  She requires total care.  He was noted to have dyspnea on exertion, occasional palpitations and baseline fatigue.  He has known interstitial lung disease and follows with pulmonology.  He denied lower extremity edema, orthopnea, PND, lightheadedness/dizziness and syncopal/presyncopal events.  He was seen by Geraldo Pitter pulmonology 09/12/2020 for shortness of breath.  He reported that he was not able to walk far distances and that his stamina had decreased.  He reported compliance with his prednisone and noted some improvement with taking prednisone.  He reported that he was having an increased amount of stress due to trauma from his past job in [employer] and continue to care for his wife at home.  Who presents the clinic today for follow-up evaluation states he has noticed increasing shortness of breath over the last several weeks.  He continues to try to maintain his large property but is having more trouble.  We reviewed his lab work from pulmonology yesterday which shows a hemoglobin of 10.4.  He is receiving lithotripsy for renal calculi.  He is currently taking iron supplementation only twice per week.  I have recommended that he take his iron supplement daily and use cast iron cookware.  Pulmonology has recommended a pulmonary function test.  We reviewed his EKG from last month.  I will order a echocardiogram, give him a salty 6 diet sheet, have him increase his iron supplementation, and follow-up with cardiology in 1 month.  Today denies chest pain,  lower extremity edema,palpitations, melena,  hemoptysis, diaphoresis, weakness, presyncope, syncope, orthopnea, and PND.   Home Medications    Prior to Admission medications   Medication Sig Start Date End Date Taking? Authorizing Provider  acetaminophen (TYLENOL) 500 MG tablet Take 1,000 mg by mouth 2 (two) times daily as needed (for pain.).    [provider]  Adalimumab 40 MG/0.4ML PSKT Inject 40 mg into the skin  every 14 (fourteen) days.     [provider]  albuterol (VENTOLIN HFA) 108 (90 Base) MCG/ACT inhaler Inhale 2 puffs into the lungs every 6 (six) hours as needed for wheezing or shortness of breath. 06/16/20   Rigoberto Noel, MD  aspirin EC 81 MG tablet Take 1 tablet (81 mg total) by mouth daily. Do not start until MAY 8,2020 Patient taking differently: Take 81 mg by mouth daily. 08/07/18   Leonie Man, MD  calcipotriene (DOVONOX) 0.005 % cream Apply 1 application topically 2 (two) times daily as needed (psoriasis).    [provider]  cetirizine Alethia Berthold)  10 MG tablet Take 10 mg by mouth daily.    [provider]  clobetasol cream (TEMOVATE) 4.58 % Apply 1 application topically 2 (two) times daily as needed (for psoriasis).    [provider]  diltiazem (CARDIZEM CD) 180 MG 24 hr capsule Take 1 capsule by mouth once daily 07/10/20   Leonie Man, MD  ferrous sulfate 325 (65 FE) MG tablet Take 325 mg by mouth 2 (two) times a week. Wednesday's and Saturday's    [provider]  gabapentin (NEURONTIN) 300 MG capsule Take 300-600 mg by mouth See admin instructions. Take 300 mg in the morning and 600 mg in th evening    [provider]  ketorolac (TORADOL) 10 MG tablet Take 1 tablet (10 mg total) by mouth every 6 (six) hours as needed for severe pain. 02/03/20   Enrique Sack, FNP  metFORMIN (GLUCOPHAGE-XR) 500 MG 24 hr tablet Take 500 mg by mouth 2 (two) times daily.    [provider]  ondansetron (ZOFRAN ODT) 4 MG disintegrating tablet 4mg  ODT q4 hours prn nausea/vomit 08/14/20   Milton Ferguson, MD  oxybutynin (DITROPAN) 5 MG tablet Take 1 tablet (5 mg total) by mouth every 8 (eight) hours as needed for bladder spasms. 03/28/20   Irine Seal, MD  phenazopyridine (PYRIDIUM) 200 MG tablet Take 1 tablet (200 mg total) by mouth 3 (three) times daily as needed for pain (prn burning). 03/28/20   Irine Seal, MD  Polyethyl Glycol-Propyl  Glycol (SYSTANE OP) Place 1 drop into both eyes daily.    [provider]  predniSONE (DELTASONE) 5 MG tablet Take 10 mg on M, W, F, and 5 mg on other days. 06/16/20   Rigoberto Noel, MD  rosuvastatin (CRESTOR) 20 MG tablet Take 1 tablet (20 mg total) by mouth every evening. 04/03/20 07/02/20  Ledora Bottcher, PA  tamsulosin (FLOMAX) 0.4 MG CAPS capsule Take 0.4 mg by mouth every evening.     [provider]  traMADol (ULTRAM) 50 MG tablet Take 1-2 tablets (50-100 mg total) by mouth every 6 (six) hours as needed for moderate pain. 08/11/20   Ardis Hughs, MD    Family History    Family History  Problem Relation Age of Onset  . Coronary artery disease Mother   . Breast cancer Mother   . Colon cancer Mother   . Heart disease Maternal Grandmother        MI  . Stroke Paternal Grandfather        CVA  . Coronary artery disease Paternal Grandfather   . Esophageal cancer Neg Hx   . Stomach cancer Neg Hx   . Rectal cancer Neg Hx    He indicated that his mother is deceased. He indicated that his father is deceased. He indicated that his maternal grandmother is deceased. He indicated that his maternal grandfather is deceased. He indicated that his paternal grandmother is deceased. He indicated that his paternal grandfather is deceased. He indicated that the status of his neg hx is unknown.  Social History    Social History   Socioeconomic History  . Marital status: Married    Spouse name: Not on file  . Number of children: 1  . Years of education: Not on file  . Highest education level: Not on file  Occupational History  . Occupation: Retired Armed forces operational officer  Tobacco Use  . Smoking status: Former Smoker    Years: 10.00    Types: Cigars  Quit date: 09/29/1997    Years since quitting: 22.9  . Smokeless tobacco: Former Systems developer    Types: Snuff, Chew  . Tobacco comment: 4-5 per day at his max  Vaping Use  . Vaping Use: Never used  Substance and Sexual Activity   . Alcohol use: No  . Drug use: No  . Sexual activity: Not on file  Other Topics Concern  . Not on file  Social History Narrative   Pt lives in [place] with spouse.     Retired.    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       After retiring from [place].        Had been very active as an Hotel manager"  - now doing his best to "stay out of this mess of politics" -- has declined efforts to get him to run for office.      Now is primary caregiver for his wife Judeen Hammans) - who has metastatic CA & has had CVA x 2.  Pretty much "total care".  Very emotionally taxing.      He has plans to build homes for all of his children & grandchildren on their property - to ensure they all have a place to live - Not transferable   Social Determinants of Health   Financial Resource Strain: Not on file  Food Insecurity: Not on file  Transportation Needs: Not on file  Physical Activity: Not on file  Stress: Not on file  Social Connections: Not on file  Intimate Partner Violence: Not on file     Review of Systems    General:  No chills, fever, night sweats or weight changes.  Cardiovascular:  No chest pain, dyspnea on exertion, edema, orthopnea, palpitations, paroxysmal nocturnal dyspnea. Dermatological: No rash, lesions/masses Respiratory: No cough, dyspnea Urologic: No hematuria, dysuria Abdominal:   No nausea, vomiting, diarrhea, bright red blood per rectum, melena, or hematemesis Neurologic:  No visual changes, wkns, changes in mental status. All other systems reviewed and are otherwise negative except as noted above.  Physical Exam    VS:  BP (!) 144/62   Pulse 89   Ht 6\' 3"  (1.905 m)   Wt 231 lb 12.8 oz (105.1 kg)   SpO2 98%   BMI 28.97 kg/m  , BMI Body mass index is 28.97 kg/m. GEN: Well nourished, well developed,  in no acute distress. HEENT: normal. Neck: Supple, no JVD, carotid bruits, or masses. Cardiac: RRR, 2/6 systolic murmur heard best along right sternal border , rubs, or gallops. No clubbing, cyanosis, edema.  Radials/DP/PT 2+ and equal bilaterally.  Respiratory:  Respirations regular and unlabored, clear to auscultation bilaterally. GI: Soft, nontender, nondistended, BS + x 4. MS: no deformity or atrophy. Skin: warm and dry, no rash. Neuro:  Strength and sensation are intact. Psych: Normal affect.  Accessory Clinical Findings    Recent Labs: 08/14/2020: ALT 18; BUN 12; Creatinine, Ser 1.15; Potassium 3.6; Sodium 137 09/12/2020: Hemoglobin 10.4; Platelets 176.0; Pro B Natriuretic peptide (BNP) 82.0   Recent Lipid Panel    Component Value Date/Time   CHOL 174 05/12/2017 1130   TRIG 197 (H) 05/12/2017 1130   HDL 49 05/12/2017 1130   CHOLHDL 3.6 05/12/2017 1130   CHOLHDL 9.5 08/17/2009 0444   VLDL UNABLE TO CALCULATE IF TRIGLYCERIDE OVER 400 mg/dL 08/17/2009 0444   LDLCALC 86  05/12/2017 1130    ECG personally reviewed by me today-none today.  Cardiac catheterization 06/28/2016  Mid RCA lesion, 99 %stenosed.  A STENT SYNERGY DES 2.75X20 drug eluting stent was successfully placed.  Post intervention, there is a 0% residual stenosis.  Mid LAD lesion, 65 %stenosed. FFR performed => final FFR 0.75 (physiologically significant). Plan staged PCI  3rd Mrg lesion, 55 %stenosed.  Hemodynamic findings consistent with mild pulmonary hypertension. LV end diastolic pressure is moderately elevated.  The left ventricular systolic function is normal.  The left ventricular ejection fraction is 55-65% by visual estimate.    Mild secondary pulmonary hypertension  Severe 2 Vessel disease - 95% mRCA s/p PCI with DES; ~70% prox LAD with FFR 0.75  Codominant Cx with distal ~60% lesion prior to bifurcation into distal LPL branches  Plan: Overnight monitoring post cath. He will need close  monitoring of the right groin hematoma. Likely discharge tomorrow. We'll start scheduled PCI LAD 1st week of April (~April 3rd)- would plan same day d/c  Continue Plavix. Add Statin for d/c. Will likely need to d/c Flecainide given CAD Dx. - Defer to Afib Clinic; continue Diltiazem. Defer full anticoagulation to Afib Clinic.   [name], MD  Diagnostic Dominance: Right    Intervention     Cardiac catheterization 07/10/2016  Mid LAD (just distal to SP1) lesion, 65-70 %stenosed. FFR 0.75  A STENT SYNERGY DES 3.5X20 drug eluting stent was successfully placed.  Post intervention, there is a 0% residual stenosis.  3rd Mrg lesion, 55 %stenosed.   Successful PCI of LAD lesion.  Plan: Return to Short Stay for TR Band Removal & post-cath Hydration.  If stable, plan is for Same Day Discharge Later on this afternoon.  Continue ASA/Plavix for now  Consider reducing AV Nodal agent b/c concern for fatigue - defer to Roderic Palau, NP from A fib Clinic   [name], M.D., M.S. Interventional Cardiologist   Diagnostic Dominance: Right    Intervention       Assessment & Plan   1.  DOE- has noticed increased dyspnea with increased physical activity over the last several months.  He is now able to only walk short distances without becoming increasingly short of breath.  He was seen by pulmonology yesterday for evaluating pulmonary function.  He is receiving lithotripsy for renal calculi.  Hemoglobin 10.4 on last check. Order echocardiogram Heart healthy low-sodium diet Maintain physical activity Take iron supplementation daily- increase fiber in diet. Use cast iron cookware  Coronary artery disease-no chest pain today.   Underwent staged PCI in 2018 for RCA and LAD stenosis.  Details above.  Plavix discontinued due to bruising.  Continue aspirin, rosuvastatin Heart healthy low-sodium diet-salty 6 given Maintain physical activity   Diastolic CHF-euvolemic  today.  Has noted recent increased DOE increase physical activity.  Reports reduced stamina/endurance.  May be related to ILD.  Will order echocardiogram for further evaluation. Order echocardiogram Continue diltiazem Heart healthy low-sodium diet-salty 6 given Maintain physical activity  Atypical atrial flutter-heart rate today 89.  Atrial fibrillation ablation 2015.  Reports occasional episodes of increased heart rate.   Continue aspirin, Cardizem Heart healthy low-sodium diet-salty 6 given   Hyperlipidemia-LDL 86 on 05/12/17 Continue rosuvastatin, aspirin Heart healthy low-sodium high-fiber diet Maintain physical activity  Interstitial lung disease- seen and evaluated by pulmonology 09/11/2020.  Ordered PFTs.  Hemoglobin 10.4 Continue inhalers Keep follow-up with pulmonology   Disposition: Follow-up with Dr. Ellyn Hack or me in 1 month.   Jossie Ng. Kamani Lewter  NP-C    09/13/2020, 10:27 AM [place] [address] Office [phone] Fax [phone]  Notice: This dictation was prepared with Dragon dictation along with smaller phrase technology. Any transcriptional errors that result from this process are unintentional and may not be corrected upon review.  I spent 15 minutes examining this patient, reviewing medications, and using patient centered shared decision making involving her cardiac care.  Prior to her visit I spent greater than 20 minutes reviewing her past medical history,  medications, and prior cardiac tests.

## 2020-09-12 NOTE — Progress Notes (Signed)
Please let patient know bnp (fluid level) was normal. He is anemic, hgb is 10.4. I would have him follow-up with PCP

## 2020-09-12 NOTE — Assessment & Plan Note (Addendum)
-   Patient reports worsening dyspnea symptoms with associated fatigue and poor appetite. He often goes in and out of afib. Heart rate appears to be in normal rhythm on exam today. No evidence of acute CHF exacerbation. HRCT in March 2022 showed pattern of ILD is unchanged from July 2021. Needs PFTs, last attempted in 2018 but unable to complete d/t coughing. Continue 5mg  prednisone, alternating with 10mg  MWF. Advise he use albuterol rescue inhaler prior to strenuous activity. FU in 1 month with Dr. Elsworth Soho.

## 2020-09-13 ENCOUNTER — Encounter: Payer: Self-pay | Admitting: General Practice

## 2020-09-13 ENCOUNTER — Ambulatory Visit (INDEPENDENT_AMBULATORY_CARE_PROVIDER_SITE_OTHER): Payer: Medicare Other | Admitting: General Practice

## 2020-09-13 VITALS — BP 144/62 | HR 89 | Ht 75.0 in | Wt 231.8 lb

## 2020-09-13 DIAGNOSIS — E785 Hyperlipidemia, unspecified: Secondary | ICD-10-CM | POA: Diagnosis not present

## 2020-09-13 DIAGNOSIS — I5032 Chronic diastolic (congestive) heart failure: Secondary | ICD-10-CM

## 2020-09-13 DIAGNOSIS — J849 Interstitial pulmonary disease, unspecified: Secondary | ICD-10-CM

## 2020-09-13 DIAGNOSIS — I484 Atypical atrial flutter: Secondary | ICD-10-CM | POA: Diagnosis not present

## 2020-09-13 DIAGNOSIS — R0609 Other forms of dyspnea: Secondary | ICD-10-CM

## 2020-09-13 DIAGNOSIS — R0600 Dyspnea, unspecified: Secondary | ICD-10-CM | POA: Diagnosis not present

## 2020-09-13 DIAGNOSIS — I2510 Atherosclerotic heart disease of native coronary artery without angina pectoris: Secondary | ICD-10-CM | POA: Diagnosis not present

## 2020-09-13 NOTE — Patient Instructions (Signed)
Medication Instructions:  INCREASE IRON TAKE DAILY *If you need a refill on your cardiac medications before your next appointment, please call your pharmacy*  Lab Work: NONE  Testing/Procedures: Echocardiogram - Your physician has requested that you have an echocardiogram. Echocardiography is a painless test that uses sound waves to create images of your heart. It provides your doctor with information about the size and shape of your heart and how well your heart's chambers and valves are working. This procedure takes approximately one hour. There are no restrictions for this procedure. This will be performed at our [facility] location - [address], [address].  Special Instructions USE CAST IRON SKILLET COOKWARE  PLEASE READ AND FOLLOW SALTY 6-ATTACHED-1,800 mg daily  PLEASE MAINTAIN PHYSICAL ACTIVITY  Follow-Up: Your next appointment:  09-30-2020 at 940AM Virtual Visit  with [name], MD   At [facility], you and your health needs are our priority.  As part of our continuing mission to provide you with exceptional heart care, we have created designated Provider Care Teams.  These Care Teams include your primary Cardiologist (physician) and Advanced Practice Providers (APPs -  Physician Assistants and Nurse Practitioners) who all work together to provide you with the care you need, when you need it.  We recommend signing up for the patient portal called "MyChart".  Sign up information is provided on this After Visit Summary.  MyChart is used to connect with patients for Virtual Visits (Telemedicine).  Patients are able to view lab/test results, encounter notes, upcoming appointments, etc.  Non-urgent messages can be sent to your provider as well.   To learn more about what you can do with MyChart, go to [email or web address].           Iron-Rich Diet  Iron is a mineral that helps your body to produce hemoglobin. Hemoglobin is a protein in red blood cells that carries  oxygen to your body's tissues. Eating too little iron may cause you to feel weak and tired, and it can increase your risk of infection. Iron is naturally found in many foods, and many foods have iron added to them (iron-fortified foods). You may need to follow an iron-rich diet if you do not have enough iron in your body due to certain medical conditions. The amount of iron that you need each day depends on your age, your sex, and any medical conditions you have. Follow instructions from your health care provider or a diet and nutrition specialist (dietitian) about how much iron you should eat each day. What are tips for following this plan? Reading food labels  Check food labels to see how many milligrams (mg) of iron are in each serving. Cooking  Cook foods in pots and pans that are made from iron.  Take these steps to make it easier for your body to absorb iron from certain foods: ? Soak beans overnight before cooking. ? Soak whole grains overnight and drain them before using. ? Ferment flours before baking, such as by using yeast in bread dough. Meal planning  When you eat foods that contain iron, you should eat them with foods that are high in vitamin C. These include oranges, peppers, tomatoes, potatoes, and mango. Vitamin C helps your body to absorb iron. General information  Take iron supplements only as told by your health care provider. An overdose of iron can be life-threatening. If you were prescribed iron supplements, take them with orange juice or a vitamin C supplement.  When you eat iron-fortified  foods or take an iron supplement, you should also eat foods that naturally contain iron, such as meat, poultry, and fish. Eating naturally iron-rich foods helps your body to absorb the iron that is added to other foods or contained in a supplement.  Certain foods and drinks prevent your body from absorbing iron properly. Avoid eating these foods in the same meal as iron-rich foods or  with iron supplements. These foods include: ? Coffee, black tea, and red wine. ? Milk, dairy products, and foods that are high in calcium. ? Beans and soybeans. ? Whole grains. What foods should I eat? Fruits Prunes. Raisins. Eat fruits high in vitamin C, such as oranges, grapefruits, and strawberries, alongside iron-rich foods. Vegetables Spinach (cooked). Green peas. Broccoli. Fermented vegetables. Eat vegetables high in vitamin C, such as leafy greens, potatoes, bell peppers, and tomatoes, alongside iron-rich foods. Grains Iron-fortified breakfast cereal. Iron-fortified whole-wheat bread. Enriched rice. Sprouted grains. Meats and other proteins Beef liver. Oysters. Beef. Shrimp. [place]. Chicken. [place]. Sardines. Chickpeas. Nuts. Tofu. Pumpkin seeds. Beverages Tomato juice. Fresh orange juice. Prune juice. Hibiscus tea. Fortified instant breakfast shakes. Sweets and desserts Blackstrap molasses. Seasonings and condiments Tahini. Fermented soy sauce. Other foods Wheat germ. The items listed above may not be a complete list of recommended foods and beverages. Contact a dietitian for more information. What foods should I avoid? Grains Whole grains. Bran cereal. Bran flour. Oats. Meats and other proteins Soybeans. Products made from soy protein. Black beans. Lentils. Mung beans. Split peas. Dairy Milk. Cream. Cheese. Yogurt. Cottage cheese. Beverages Coffee. Black tea. Red wine. Sweets and desserts Cocoa. Chocolate. Ice cream. Other foods Basil. Oregano. Large amounts of parsley. The items listed above may not be a complete list of foods and beverages to avoid. Contact a dietitian for more information. Summary  Iron is a mineral that helps your body to produce hemoglobin. Hemoglobin is a protein in red blood cells that carries oxygen to your body's tissues.  Iron is naturally found in many foods, and many foods have iron added to them (iron-fortified foods).  When you eat  foods that contain iron, you should eat them with foods that are high in vitamin C. Vitamin C helps your body to absorb iron.  Certain foods and drinks prevent your body from absorbing iron properly, such as whole grains and dairy products. You should avoid eating these foods in the same meal as iron-rich foods or with iron supplements. This information is not intended to replace advice given to you by your health care provider. Make sure you discuss any questions you have with your health care provider. Document Revised: 03/07/2017 Document Reviewed: 02/18/2017 Elsevier Patient Education  2021 [employer].

## 2020-09-29 ENCOUNTER — Other Ambulatory Visit: Payer: Self-pay

## 2020-09-29 ENCOUNTER — Other Ambulatory Visit (HOSPITAL_COMMUNITY): Admission: RE | Admit: 2020-09-29 | Payer: Medicare Other | Source: Home / Self Care

## 2020-09-29 ENCOUNTER — Ambulatory Visit (HOSPITAL_COMMUNITY)
Admission: RE | Admit: 2020-09-29 | Discharge: 2020-09-29 | Disposition: A | Discharge status: Home / Self Care | Payer: Medicare Other | Source: Ambulatory Visit | Attending: General Practice | Admitting: General Practice

## 2020-09-29 DIAGNOSIS — R0600 Dyspnea, unspecified: Secondary | ICD-10-CM | POA: Insufficient documentation

## 2020-09-29 DIAGNOSIS — R0609 Other forms of dyspnea: Secondary | ICD-10-CM

## 2020-09-29 HISTORY — PX: TRANSTHORACIC ECHOCARDIOGRAM: SHX275

## 2020-09-29 LAB — ECHOCARDIOGRAM COMPLETE
AR max vel: 0.88 cm2
AV Area VTI: 0.96 cm2
AV Area mean vel: 0.86 cm2
AV Mean grad: 25.2 mmHg
AV Peak grad: 43.9 mmHg
Ao pk vel: 3.31 m/s
Area-P 1/2: 2.46 cm2
P 1/2 time: 680 msec
S' Lateral: 2.8 cm

## 2020-09-29 NOTE — Progress Notes (Signed)
*  PRELIMINARY RESULTS* Echocardiogram 2D Echocardiogram has been performed.  Samuel Germany 09/29/2020, 12:37 PM

## 2020-10-03 ENCOUNTER — Telehealth: Payer: Self-pay | Admitting: *Deleted

## 2020-10-03 ENCOUNTER — Other Ambulatory Visit: Payer: Self-pay | Admitting: *Deleted

## 2020-10-03 ENCOUNTER — Telehealth (INDEPENDENT_AMBULATORY_CARE_PROVIDER_SITE_OTHER): Payer: Medicare Other | Admitting: Cardiology

## 2020-10-03 ENCOUNTER — Encounter: Payer: Self-pay | Admitting: Cardiology

## 2020-10-03 VITALS — BP 143/82 | HR 85 | Ht 75.0 in | Wt 222.0 lb

## 2020-10-03 DIAGNOSIS — I25119 Atherosclerotic heart disease of native coronary artery with unspecified angina pectoris: Secondary | ICD-10-CM

## 2020-10-03 DIAGNOSIS — R0609 Other forms of dyspnea: Secondary | ICD-10-CM

## 2020-10-03 DIAGNOSIS — J849 Interstitial pulmonary disease, unspecified: Secondary | ICD-10-CM

## 2020-10-03 DIAGNOSIS — I10 Essential (primary) hypertension: Secondary | ICD-10-CM | POA: Diagnosis not present

## 2020-10-03 DIAGNOSIS — E785 Hyperlipidemia, unspecified: Secondary | ICD-10-CM | POA: Diagnosis not present

## 2020-10-03 DIAGNOSIS — I5032 Chronic diastolic (congestive) heart failure: Secondary | ICD-10-CM | POA: Diagnosis not present

## 2020-10-03 DIAGNOSIS — E1169 Type 2 diabetes mellitus with other specified complication: Secondary | ICD-10-CM | POA: Diagnosis not present

## 2020-10-03 DIAGNOSIS — R0600 Dyspnea, unspecified: Secondary | ICD-10-CM | POA: Diagnosis not present

## 2020-10-03 DIAGNOSIS — Z9861 Coronary angioplasty status: Secondary | ICD-10-CM

## 2020-10-03 NOTE — Assessment & Plan Note (Addendum)
His angina was actually really mostly exertional dyspnea. ->  Current level of exertional dyspnea would be consistent with Class III Angina. Has been relatively stable-using diltiazem as opposed to beta-blocker (vasodilatory effect, rate control, antianginal without worsening fatigue)  Currently only on aspirin secondary to bruising-however now with progressively worsening dyspnea there is a possibility of repeat PCI which would put him back on Plavix.  Not able to take more than current dose of rosuvastatin.  Plan: With progressively worsening dyspnea -> we will schedule for Right and Left Heart Catheterization with Coronary Angiography and Possible PCI  See consent statement and patient instructions

## 2020-10-03 NOTE — Assessment & Plan Note (Signed)
He just had chest CT scan checked that was relatively stable.  Has PE PFTs pending.  CT scan did show dilated PA suggestive possibly of pulmonary hypertension.  We can evaluate with a right heart cath as part of right left heart cath

## 2020-10-03 NOTE — H&P (View-Only) (Signed)
Virtual Visit via Telephone Note   This visit type was conducted due to national recommendations for restrictions regarding the COVID-19 Pandemic (e.g. social distancing) in an effort to limit this patient's exposure and mitigate transmission in our community.  Due to his co-morbid illnesses, this patient is at least at moderate risk for complications without adequate follow up.  This format is felt to be most appropriate for this patient at this time.  The patient did not have access to video technology/had technical difficulties with video requiring transitioning to audio format only (telephone).  All issues noted in this document were discussed and addressed.  No physical exam could be performed with this format.  Please refer to the patient's chart for his  consent to telehealth for [facility].   Patient has given verbal permission to conduct this visit via virtual appointment and to bill insurance 10/03/2020 12:39 PM     Evaluation Performed:  Follow-up visit  Date:  10/03/2020   ID:  Kenneth [name], DOB 02-13-1946, MRN [id number]  Patient Location: Home Provider Location: Home Office  PCP:  Clinic, [facility]  Cardiologist:  [name], MD  Electrophysiologist:  None  Pulmonologist: Dr. Elsworth Soho  Chief Complaint:   Chief Complaint  Patient presents with   Follow-up    Echo results   Shortness of Breath    With minimal exertion   Coronary Artery Disease    Never had angina, presenting symptom was exertional dyspnea    ==================================== ASSESSMENT & PLAN:    Problem List Items Addressed This Visit     ILD (interstitial lung disease) ([place]) (Chronic)    He just had chest CT scan checked that was relatively stable.  Has PE PFTs pending.  CT scan did show dilated PA suggestive possibly of pulmonary hypertension.  We can evaluate with a right heart cath as part of right left heart cath       Coronary artery disease involving native coronary  artery with angina pectoris ([place]) - Primary (Chronic)    His angina was actually really mostly exertional dyspnea. ->  Current level of exertional dyspnea would be consistent with Class III Angina. Has been relatively stable-using diltiazem as opposed to beta-blocker (vasodilatory effect, rate control, antianginal without worsening fatigue)  Currently only on aspirin secondary to bruising-however now with progressively worsening dyspnea there is a possibility of repeat PCI which would put him back on Plavix.  Not able to take more than current dose of rosuvastatin.  Plan: With progressively worsening dyspnea -> we will schedule for Right and Left Heart Catheterization with Coronary Angiography and Possible PCI  See consent statement and patient instructions       Relevant Orders   CBC   Lipid panel   Comprehensive metabolic panel   Exertional dyspnea - Class III Angina (Chronic)    It appears that his exertional dyspnea has gotten worse.  This was his original anginal type equivalent symptom.  He is EF looks great on echo and there is no wall motion abnormality which is not unsurprising.  However he is now getting progressively worse to the point where he seems to only be on a walk around the house.  From an anginal equivalent this would be at least Class III if not IV.  Plan: RIGHT AND LEFT HEART CATHETERIZATION WITH CORONARY ANGIOGRAPHY AND POSSIBLE PCI       Relevant Orders   CBC   Lipid panel   Comprehensive metabolic panel   Diastolic CHF, chronic (  Minor) (Chronic)    Has not really seemed volume overloaded.  He has not had diuretic requirement.  He does have potential evidence of pulmonary hypertension from ILD.  DOE definitely worse.  Plan: Right Left Heart Catheterization with Coronary Angiography and Possible PCI       Relevant Orders   CBC   Lipid panel   Comprehensive metabolic panel   Hyperlipidemia associated with type 2 diabetes mellitus ([place]) (Chronic)    I do  not see recent labs from the [place].  He is on stable dose of rosuvastatin.  We can get fasting lipids as part of precath labs.  He remains on metformin only for his diabetes.       Relevant Orders   Lipid panel   Comprehensive metabolic panel   Essential hypertension (Chronic)    BP seems a little bit elevated today.  He does seem a little stressed out.  We can see what his pressures look like when he comes in for the heart catheterization.  May potentially benefit from additional medication, but would avoid beta-blocker because of his fatigue. We actually using a nondihydropyridine calcium channel blocker alone.  May need to consider ARB with possible HFpEF.       Relevant Orders   Comprehensive metabolic panel    ====================================  History of Present Illness:    Kenneth [name] is a 75 y.o. male with PMH notable for CAD-two-vessel PCI, HFpEF, history of atrial flutter-ablation, ILD along with HTN, HLD and psoriatic arthritis as well as recurrent nephrolithiasis who presents via audio/video conferencing for a telehealth visit today as a 3-week follow-up for progressively worsening exertional dyspnea.  Kenneth [name] was last just seen on September 13, 2020 by Coletta Memos, NP for worsening exertional dyspnea over the preceding few weeks.  He is as usual staying very busy trying to maintain his large property-putting up fences and then building up houses for his family.  He was noted to be mildly anemic with a hemoglobin 10.4 likely related to nephrolithiasis (recent lithotripsy)-microscopic hematuria. His pulmonologist has ordered lung function tests and recently checked high resolution CTof the chest that was relatively stable.  Lung function tests are still pending. -> 2D echo ordered.  Hospitalizations:  March 28, 2020: Cystoscopy of right retrograde pyelogram July 31, 2020-planned left-sided lithotripsy-rescheduled and performed on Aug 10, 2020   Recent -  Interim CV studies:   The following studies were reviewed today: Transthoracic Echo 09/29/2020: Hyperdynamic left ventricle with a EF of> 75%.  No R WMA.  Mild LVH-indeterminate diastolic parameters mild LA dilation.  Moderate AS /mild AI - Mean gradient 25 mmHg.  Mild MR.  Elevated RAP.  Inerval History   Kenneth [name] is being evaluated today via telehealth.  He is accompanied by his wife who thankfully is actually starting to show signs of significant recovery related to her cancer therapy.  She is still using a walker, but hoping to transfer to a cane soon.  She is able to do ADLs including cooking and showering.  She is always been Kenneth [name]'s main caregiver, but there has been role reversal over the last year or so because of her illness.  Kenneth [name] was pretty quiet today, but does indicate that he is just chronically fatigued and gets short of breath just walking around the house.  He then insists that he is still able to out do several younger men when it comes to putting of the fence on his property.  He then says  that as long as he is able to go slow or moderate pace, he usually is okay, but if he does any more than that he will get short of breath.  This is contradictory to the statement of getting short of breath walking around the house.  He denies any chest pain or pressure with rest or exertion which was similar to the case when he had a stents placed.  He has not had any PND or orthopnea and does not have resting dyspnea.  No notable edema.  Rare occasional palpitations but nothing overwhelming.  He may get dizzy or lightheaded sometimes when he first stands up but has not had syncope or near syncope.  Per the urologist, he has passed now at least all but 1 somewhat larger stone.  He has had some microscopic hematuria but no visible hematuria.   ROS:  Please see the history of present illness.     Review of Systems  Constitutional:  Positive for malaise/fatigue and weight loss.  HENT:   Negative for congestion and sinus pain.   Respiratory:  Positive for wheezing (According to him, no more than usual). Negative for cough.   Cardiovascular:  Positive for palpitations (Very rare). Negative for leg swelling.  Gastrointestinal:  Negative for abdominal pain (Not now since he has passed his stones), blood in stool and melena.  Genitourinary:  Negative for hematuria (Reportedly microscopic, but no microscopic).  Musculoskeletal:  Positive for joint pain (-But they do not stop him).  Neurological:  Positive for dizziness (Sometimes if stands up too quick). Negative for focal weakness and weakness.  Psychiatric/Behavioral:  Positive for depression. The patient is nervous/anxious.        He seems to be somewhat dejected.  Not very talkative the day.   Past Medical History:  Diagnosis Date   Arthritis    CAD S/P percutaneous coronary angioplasty cardiologist-  dr Ellyn Hack    06/28/16 --PCI with Synergy DES 2.75 mm 20 mm-->m RCA; 07/10/16 PCI with Synergy DES 3.5 mm x 20 mm-->mLAD   Chronic diastolic heart failure ([place])    followed by dr Amiee Wiley   Chronic dryness of both eyes    Diverticulosis of colon    Dyspnea    with exertion    Dyspnea on exertion    Epiretinal membrane    "epiretinal attachment" (06/28/2016) 10-14-2017 per pt oringinally in both , now only unilateral    GERD (gastroesophageal reflux disease)    Heart murmur    Hiatal hernia    History of adenomatous polyp of colon    History of concussion 08/23/2017   w/o loc--- per pt no residual   History of kidney stones    History of rheumatic fever 1958   History of skin cancer    excision leg;  froze the left arm--- unsure BCC or SCC    Hyperlipidemia    Hypertension    ILD (interstitial lung disease) ([place])    pulmologist-  dr Elsworth Soho--  secondary to methotrexate use --- last PFTs 04-25-2015  mild restriction   Iron deficiency anemia    Mild obstructive sleep apnea    10-04-2019 per pt  had a sleep study years ago ,  was told no cpap recommended pt denies    Nephrolithiasis    CT 09-07-2019 bilateral nonobstructive stones   Paroxysmal atrial fibrillation [facility]) cardiologist-  dr Cuahutemoc Attar/  EP-- dr Rayann Heman   a. 11/2003 Tikosyn initiated - subsequently d/c'd;  b. 2006 [place] for Afib @ [place];  c. 09/2011 Echo: EF 60-65%, Gr 2 DD;  d. DCCV 10/2011 , 04/2012, 12/ 2014, & 02/ 2015;  e. RFCA 01-13-2014   Plaque psoriasis 1969   followed by rheumatologist  and dermatologist @ [facility] in [place]   Right ureteral stone    S/P drug eluting coronary stent placement    06-28-2016  x1 to mRCA;  07-10-2016 x1 to mLAD   Seasonal allergies    Type 2 diabetes mellitus ([place])    followed by pcp   Ureteral calculus, right    Wears hearing aid in both ears    Past Surgical History:  Procedure Laterality Date   [place] N/A 01/13/2014   repeat PVI, also left atrial ablation performed with successfull ablation of LA flutter by Dr Rayann Heman   ATRIAL FIBRILLATION ABLATION  08/2004   Dr Rolland Porter at [facility] [place])   [place]  06-30-2003  dr Lia Foyer   no significant coronary obstruction, well preserved LVF, recurrent afib   CARDIOVERSION N/A 05/22/2013   Procedure: CARDIOVERSION;  Surgeon: [name], MD;  Location: [place];  Service: Cardiovascular;  Laterality: N/A;   CARDIOVERSION N/A 10/23/2011   Procedure: CARDIOVERSION;  Surgeon: Deboraha Sprang, MD;  Location: [facility] CATH LAB;  Service: Cardiovascular;  Laterality: N/A;   CARDIOVERSION N/A 05/01/2012   Procedure: CARDIOVERSION;  Surgeon: Deboraha Sprang, MD;  Location: [facility] CATH LAB;  Service: Cardiovascular;  Laterality: N/A;   CARPAL TUNNEL RELEASE Right 1980s   CATARACT EXTRACTION W/ INTRAOCULAR LENS  IMPLANT, BILATERAL  2012  approx.   CORONARY STENT INTERVENTION N/A 06/28/2016   Procedure: Coronary Stent Intervention;  Surgeon: Leonie Man, MD;  Location: [place] CV LAB;  Service: Cardiovascular;  Laterality: mRCA  PCI -Synergy DES 2.75 m x 20 mm (3.1 mm)   CORONARY STENT INTERVENTION N/A 07/10/2016   Procedure: Coronary Stent Intervention;  Surgeon: Leonie Man, MD;  Location: [place] CV LAB;  Service: Cardiovascular: mLAD PCI Synergy DES 3.5 mm x 20 mm)   CYSTOSCOPY WITH RETROGRADE PYELOGRAM, URETEROSCOPY AND STENT PLACEMENT Right 10/16/2017   Procedure: CYSTOSCOPY WITH RIGHT RETROGRADE RIGHT URETEROSCOPY AND STONE BASKET EXTRACTION;  Surgeon: Irine Seal, MD;  Location: [facility];  Service: Urology;  Laterality: Right;   CYSTOSCOPY WITH RETROGRADE PYELOGRAM, URETEROSCOPY AND STENT PLACEMENT Right 10/08/2019   Procedure: CYSTOSCOPY WITH URETEROSCOPY; RETROGRADE PYELOGRAM; STONE BASKET EXTRACTION;  STENT PLACEMENT;  Surgeon: Cleon Gustin, MD;  Location: [facility];  Service: Urology;  Laterality: Right;  1 HR   CYSTOSCOPY WITH RETROGRADE PYELOGRAM, URETEROSCOPY AND STENT PLACEMENT Right 03/28/2020   Procedure: CYSTOSCOPY WITH RIGHT RETROGRADE PYELOGRAM, URETEROSCOPY WITH HOLMIUM LASER AND STENT PLACEMENT;  Surgeon: Irine Seal, MD;  Location: [facility];  Service: Urology;  Laterality: Right;   CYSTOSCOPY/RETROGRADE/URETEROSCOPY/STONE EXTRACTION WITH BASKET Right 10/27/2017   Procedure: CYSTOSCOPY/RETROGRADE/URETEROSCOPY/STONE EXTRACTION WITH BASKET/URETERAL STENT PLACEMENT and laser;  Surgeon: Irine Seal, MD;  Location: [facility];  Service: Urology;  Laterality: Right;   EXTRACORPOREAL SHOCK WAVE LITHOTRIPSY Left 08/10/2020   Procedure: EXTRACORPOREAL SHOCK WAVE LITHOTRIPSY (ESWL);  Surgeon: Ardis Hughs, MD;  Location: [facility];  Service: Urology;  Laterality: Left;   HOLMIUM LASER APPLICATION Right 1/61/0960   Procedure: POSSIBLE HOLMIUM LASER APPLICATION;  Surgeon: Irine Seal, MD;  Location: [facility];  Service: Urology;  Laterality: Right;   HOLMIUM LASER APPLICATION Right 07/11/4096   Procedure: HOLMIUM LASER APPLICATION;  Surgeon: Cleon Gustin, MD;  Location: [place];  Service: Urology;  Laterality: Right;   INTRAVASCULAR PRESSURE WIRE/FFR STUDY N/A 06/28/2016   Procedure: Intravascular Pressure Wire/FFR Study;  Surgeon: Leonie Man, MD;  Location: [place] CV LAB;  Service: Cardiovascular: FFR of mLAD ~65% lesion = 0.75 post --> Staged PCI   NASAL SINUS SURGERY     x 2   PROXIMAL INTERPHALANGEAL FUSION (PIP) Left 01-05-2001    dr graves   correction clawtoe and extensor tendon lengthening   RIGHT/LEFT HEART CATH AND CORONARY ANGIOGRAPHY N/A 06/28/2016   Procedure: Right/Left Heart Cath and Coronary Angiography;  Surgeon: Leonie Man, MD;  Location: [place] CV LAB;  Service: Cardiovascular: mRCA 99% (TIMI 2), mLAD ~65% (FFR 0.75), OM3 55%. mild Pulm HTN. Mod LVEDP elevation. EF 55-65%.   ROTATOR CUFF REPAIR Bilateral last one 2014   TONSILLECTOMY  age 24   URETEROSCOPIC LASER LITHOTRIPSY STONE EXTRACTIONS/  STENT PLACEMENT Bilateral 09-10-2007   dr Jeffie Pollock  [facility]   per pt has had several prior ureteroscopic stone extraction since age 45 , last one 09-10-2007    07/2016 - after staged PCI of LAD    Current Meds  Medication Sig   acetaminophen (TYLENOL) 500 MG tablet Take 1,000 mg by mouth 2 (two) times daily as needed (for pain.).   Adalimumab 40 MG/0.4ML PSKT Inject 40 mg into the skin every 14 (fourteen) days.    albuterol (VENTOLIN HFA) 108 (90 Base) MCG/ACT inhaler Inhale 2 puffs into the lungs every 6 (six) hours as needed for wheezing or shortness of breath.   aspirin EC 81 MG tablet Take 1 tablet (81 mg total) by mouth daily. Do not start until MAY 8,2020 (Patient taking differently: Take 81 mg by mouth daily.)   calcipotriene (DOVONOX) 0.005 % cream Apply 1 application topically 2 (two) times daily as needed (psoriasis).   cetirizine (ZYRTEC) 10 MG tablet Take 10 mg by mouth daily.   diltiazem (CARDIZEM CD) 180 MG 24 hr capsule Take 1 capsule by mouth once daily   ferrous sulfate 325 (65 FE)  MG tablet Take 325 mg by mouth daily.   gabapentin (NEURONTIN) 300 MG capsule Take 300-600 mg by mouth See admin instructions. Take 300 mg in the morning and 600 mg in th evening   galantamine (RAZADYNE ER) 8 MG 24 hr capsule Take 16 mg by mouth daily with breakfast.   memantine (NAMENDA) 10 MG tablet Take 20 mg by mouth at bedtime.   metFORMIN (GLUCOPHAGE-XR) 500 MG 24 hr tablet Take 500 mg by mouth daily at 12 noon.   Polyethyl Glycol-Propyl Glycol (SYSTANE OP) Place 1 drop into both eyes daily.   rosuvastatin (CRESTOR) 20 MG tablet Take 1 tablet (20 mg total) by mouth every evening.   tamsulosin (FLOMAX) 0.4 MG CAPS capsule Take 0.4 mg by mouth every evening.      Allergies:   Hydrocodone, Other, Oxycodone, Pseudoephedrine, Tramadol, and Gemfibrozil   Social History   Tobacco Use   Smoking status: Former    Pack years: 0.00    Types: Cigars    Quit date: 09/29/1997    Years since quitting: 23.0   Smokeless tobacco: Former    Types: Snuff, Chew   Tobacco comments:    4-5 per day at his max  Vaping Use   Vaping Use: Never used  Substance Use Topics   Alcohol use: No   Drug use: No     Family Hx: The patient's family history includes Breast cancer  in his mother; Colon cancer in his mother; Coronary artery disease in his mother and paternal grandfather; Heart disease in his maternal grandmother; Stroke in his paternal grandfather. There is no history of Esophageal cancer, Stomach cancer, or Rectal cancer.   Labs/Other Tests and Data Reviewed:    EKG:  No ECG reviewed.  Recent Labs: 08/14/2020: ALT 18; BUN 12; Creatinine, Ser 1.15; Potassium 3.6; Sodium 137 09/12/2020: Hemoglobin 10.4; Platelets 176.0; Pro B Natriuretic peptide (BNP) 82.0   Recent Lipid Panel Lab Results  Component Value Date/Time   CHOL 174 05/12/2017 11:30 AM   TRIG 197 (H) 05/12/2017 11:30 AM   HDL 49 05/12/2017 11:30 AM   CHOLHDL 3.6 05/12/2017 11:30 AM   CHOLHDL 9.5 08/17/2009 04:44 AM   LDLCALC 86  05/12/2017 11:30 AM    Wt Readings from Last 3 Encounters:  10/03/20 222 lb (100.7 kg)  09/13/20 231 lb 12.8 oz (105.1 kg)  09/12/20 231 lb 9.6 oz (105.1 kg)     Objective:    Vital Signs:  BP (!) 143/82   Pulse 85   Ht 6\' 3"  (1.905 m)   Wt 222 lb (100.7 kg)   BMI 27.75 kg/m   VITAL SIGNS:  reviewed Pleasant male in no obvious acute distress, but clearly not in good spirits and not happy with his symptoms. A&O x 3.  Somewhat down Mood & Affect Non-labored respirations  ==========================================  COVID-19 Education: The signs and symptoms of COVID-19 were discussed with the patient and how to seek care for testing (follow up with PCP or arrange E-visit).   The importance of social distancing was discussed today.  Time:   Today, I have spent 28 minutes with the patient with telehealth technology discussing the above problems.   An additional 10 minutes spent charting (reviewing prior notes, hospital records, studies, labs etc.) Total 38 minutes   Medication Adjustments/Labs and Tests Ordered: Current medicines are reviewed at length with the patient today.  Concerns regarding medicines are outlined above.   Patient Instructions  Medication Instructions:   No changes for now  *If you need a refill on your cardiac medications before your next appointment, please call your pharmacy*   Lab Work:  [place] check fasting lipids  If you have labs (blood work) drawn today and your tests are completely normal, you will receive your results only by: [place] (if you have MyChart) OR A paper copy in the mail If you have any lab test that is abnormal or we need to change your treatment, we will call you to review the results.   Testing/Procedures:  RIGHT AND LEFT HEART CATHETERIZATION WITH CORONARY ANGIOGRAPHY AND POSSIBLE PERCUTANEOUS CORONARY INTERVENTION  Shared Decision Making/Informed Consent{  The risks [stroke (1 in 1000),  death (1 in 30), kidney failure [usually temporary] (1 in 500), bleeding (1 in 200), allergic reaction [possibly serious] (1 in 200)], benefits (diagnostic support and management of coronary artery disease) and alternatives of a cardiac catheterization were discussed in detail with Kenneth [name] and he is willing to proceed.  Your physician has requested that you have a cardiac catheterization. Cardiac catheterization is used to diagnose and/or treat various heart conditions. Doctors may recommend this procedure for a number of different reasons. The most common reason is to evaluate chest pain. Chest pain can be a symptom of coronary artery disease (CAD), and cardiac catheterization can show whether plaque is narrowing or blocking your heart's arteries. This procedure is also used to evaluate the valves,  as well as measure the blood flow and oxygen levels in different parts of your heart. For further information please visit [email or web address]. Please follow instruction sheet, as given.    Follow-Up: At [facility], you and your health needs are our priority.  As part of our continuing mission to provide you with exceptional heart care, we have created designated Provider Care Teams.  These Care Teams include your primary Cardiologist (physician) and Advanced Practice Providers (APPs -  Physician Assistants and Nurse Practitioners) who all work together to provide you with the care you need, when you need it.  We recommend signing up for the patient portal called "MyChart".  Sign up information is provided on this After Visit Summary.  MyChart is used to connect with patients for Virtual Visits (Telemedicine).  Patients are able to view lab/test results, encounter notes, upcoming appointments, etc.  Non-urgent messages can be sent to your provider as well.   To learn more about what you can do with MyChart, go to [email or web address].    Your next appointment:   ~1 month(s)  The format for your  next appointment:   Can be either in person or virtual (preferably in person)  Provider:   You may see [name], MD or one of the following Advanced Practice Providers on your designated Care Team:   Coletta Memos, NP Rosaria Ferries, PA-C Jory Sims, DNP, ANP   Other Instructions  For now, may be take it easy until we are able to get the cardiac catheterization scheduled.    Signed, [name], MD  10/03/2020 12:39 PM    [place]

## 2020-10-03 NOTE — Assessment & Plan Note (Addendum)
I do not see recent labs from the [place].  He is on stable dose of rosuvastatin.  We can get fasting lipids as part of precath labs.  He remains on metformin only for his diabetes.

## 2020-10-03 NOTE — Assessment & Plan Note (Signed)
BP seems a little bit elevated today.  He does seem a little stressed out.  We can see what his pressures look like when he comes in for the heart catheterization.  May potentially benefit from additional medication, but would avoid beta-blocker because of his fatigue. We actually using a nondihydropyridine calcium channel blocker alone.  May need to consider ARB with possible HFpEF.

## 2020-10-03 NOTE — Assessment & Plan Note (Signed)
Has not really seemed volume overloaded.  He has not had diuretic requirement.  He does have potential evidence of pulmonary hypertension from ILD.  DOE definitely worse.  Plan: Right Left Heart Catheterization with Coronary Angiography and Possible PCI

## 2020-10-03 NOTE — Patient Instructions (Addendum)
Medication Instructions:   No changes for now  *If you need a refill on your cardiac medications before your next appointment, please call your pharmacy*   Lab Work:  [place] check fasting lipids  If you have labs (blood work) drawn today and your tests are completely normal, you will receive your results only by: [place] (if you have MyChart) OR A paper copy in the mail If you have any lab test that is abnormal or we need to change your treatment, we will call you to review the results.   Testing/Procedures:  RIGHT AND LEFT HEART CATHETERIZATION WITH CORONARY ANGIOGRAPHY AND POSSIBLE PERCUTANEOUS CORONARY INTERVENTION  Shared Decision Making/Informed Consent{  The risks [stroke (1 in 1000), death (1 in 62), kidney failure [usually temporary] (1 in 500), bleeding (1 in 200), allergic reaction [possibly serious] (1 in 200)], benefits (diagnostic support and management of coronary artery disease) and alternatives of a cardiac catheterization were discussed in detail with Mr. Wohler and he is willing to proceed.  Your physician has requested that you have a cardiac catheterization. Cardiac catheterization is used to diagnose and/or treat various heart conditions. Doctors may recommend this procedure for a number of different reasons. The most common reason is to evaluate chest pain. Chest pain can be a symptom of coronary artery disease (CAD), and cardiac catheterization can show whether plaque is narrowing or blocking your heart's arteries. This procedure is also used to evaluate the valves, as well as measure the blood flow and oxygen levels in different parts of your heart. For further information please visit [email or web address]. Please follow instruction sheet, as given.    Follow-Up: At [facility], you and your health needs are our priority.  As part of our continuing mission to provide you with exceptional heart care, we have created designated Provider  Care Teams.  These Care Teams include your primary Cardiologist (physician) and Advanced Practice Providers (APPs -  Physician Assistants and Nurse Practitioners) who all work together to provide you with the care you need, when you need it.  We recommend signing up for the patient portal called "MyChart".  Sign up information is provided on this After Visit Summary.  MyChart is used to connect with patients for Virtual Visits (Telemedicine).  Patients are able to view lab/test results, encounter notes, upcoming appointments, etc.  Non-urgent messages can be sent to your provider as well.   To learn more about what you can do with MyChart, go to [email or web address].    Your next appointment:   ~1 month(s)  The format for your next appointment:   Can be either in person or virtual (preferably in person)  Provider:   You may see [name], MD or one of the following Advanced Practice Providers on your designated Care Team:   Coletta Memos, NP Rosaria Ferries, PA-C Jory Sims, DNP, ANP   Other Instructions  For now, may be take it easy until we are able to get the cardiac catheterization scheduled.

## 2020-10-03 NOTE — Assessment & Plan Note (Addendum)
It appears that his exertional dyspnea has gotten worse.  This was his original anginal type equivalent symptom.  He is EF looks great on echo and there is no wall motion abnormality which is not unsurprising.  However he is now getting progressively worse to the point where he seems to only be on a walk around the house.  From an anginal equivalent this would be at least Class III if not IV.  Plan: RIGHT AND LEFT HEART CATHETERIZATION WITH CORONARY ANGIOGRAPHY AND POSSIBLE PCI

## 2020-10-03 NOTE — Progress Notes (Signed)
Virtual Visit via Telephone Note   This visit type was conducted due to national recommendations for restrictions regarding the COVID-19 Pandemic (e.g. social distancing) in an effort to limit this patient's exposure and mitigate transmission in our community.  Due to his co-morbid illnesses, this patient is at least at moderate risk for complications without adequate follow up.  This format is felt to be most appropriate for this patient at this time.  The patient did not have access to video technology/had technical difficulties with video requiring transitioning to audio format only (telephone).  All issues noted in this document were discussed and addressed.  No physical exam could be performed with this format.  Please refer to the patient's chart for his  consent to telehealth for [facility].   Patient has given verbal permission to conduct this visit via virtual appointment and to bill insurance 10/03/2020 12:39 PM     Evaluation Performed:  Follow-up visit  Date:  10/03/2020   ID:  Kenneth [name], DOB September 17, 1945, MRN [id number]  Patient Location: Home Provider Location: Home Office  PCP:  Clinic, [facility]  Cardiologist:  [name], MD  Electrophysiologist:  None  Pulmonologist: Dr. Elsworth Soho  Chief Complaint:   Chief Complaint  Patient presents with   Follow-up    Echo results   Shortness of Breath    With minimal exertion   Coronary Artery Disease    Never had angina, presenting symptom was exertional dyspnea    ==================================== ASSESSMENT & PLAN:    Problem List Items Addressed This Visit     ILD (interstitial lung disease) ([place]) (Chronic)    He just had chest CT scan checked that was relatively stable.  Has PE PFTs pending.  CT scan did show dilated PA suggestive possibly of pulmonary hypertension.  We can evaluate with a right heart cath as part of right left heart cath       Coronary artery disease involving native coronary  artery with angina pectoris ([place]) - Primary (Chronic)    His angina was actually really mostly exertional dyspnea. ->  Current level of exertional dyspnea would be consistent with Class III Angina. Has been relatively stable-using diltiazem as opposed to beta-blocker (vasodilatory effect, rate control, antianginal without worsening fatigue)  Currently only on aspirin secondary to bruising-however now with progressively worsening dyspnea there is a possibility of repeat PCI which would put him back on Plavix.  Not able to take more than current dose of rosuvastatin.  Plan: With progressively worsening dyspnea -> we will schedule for Right and Left Heart Catheterization with Coronary Angiography and Possible PCI  See consent statement and patient instructions       Relevant Orders   CBC   Lipid panel   Comprehensive metabolic panel   Exertional dyspnea - Class III Angina (Chronic)    It appears that his exertional dyspnea has gotten worse.  This was his original anginal type equivalent symptom.  He is EF looks great on echo and there is no wall motion abnormality which is not unsurprising.  However he is now getting progressively worse to the point where he seems to only be on a walk around the house.  From an anginal equivalent this would be at least Class III if not IV.  Plan: RIGHT AND LEFT HEART CATHETERIZATION WITH CORONARY ANGIOGRAPHY AND POSSIBLE PCI       Relevant Orders   CBC   Lipid panel   Comprehensive metabolic panel   Diastolic CHF, chronic (  [place]) (Chronic)    Has not really seemed volume overloaded.  He has not had diuretic requirement.  He does have potential evidence of pulmonary hypertension from ILD.  DOE definitely worse.  Plan: Right Left Heart Catheterization with Coronary Angiography and Possible PCI       Relevant Orders   CBC   Lipid panel   Comprehensive metabolic panel   Hyperlipidemia associated with type 2 diabetes mellitus ([place]) (Chronic)    I do  not see recent labs from the [place].  He is on stable dose of rosuvastatin.  We can get fasting lipids as part of precath labs.  He remains on metformin only for his diabetes.       Relevant Orders   Lipid panel   Comprehensive metabolic panel   Essential hypertension (Chronic)    BP seems a little bit elevated today.  He does seem a little stressed out.  We can see what his pressures look like when he comes in for the heart catheterization.  May potentially benefit from additional medication, but would avoid beta-blocker because of his fatigue. We actually using a nondihydropyridine calcium channel blocker alone.  May need to consider ARB with possible HFpEF.       Relevant Orders   Comprehensive metabolic panel    ====================================  History of Present Illness:    Kenneth [name] is a 75 y.o. male with PMH notable for CAD-two-vessel PCI, HFpEF, history of atrial flutter-ablation, ILD along with HTN, HLD and psoriatic arthritis as well as recurrent nephrolithiasis who presents via audio/video conferencing for a telehealth visit today as a 3-week follow-up for progressively worsening exertional dyspnea.  Kenneth [name] was last just seen on September 13, 2020 by Coletta Memos, NP for worsening exertional dyspnea over the preceding few weeks.  He is as usual staying very busy trying to maintain his large property-putting up fences and then building up houses for his family.  He was noted to be mildly anemic with a hemoglobin 10.4 likely related to nephrolithiasis (recent lithotripsy)-microscopic hematuria. His pulmonologist has ordered lung function tests and recently checked high resolution CTof the chest that was relatively stable.  Lung function tests are still pending. -> 2D echo ordered.  Hospitalizations:  March 28, 2020: Cystoscopy of right retrograde pyelogram July 31, 2020-planned left-sided lithotripsy-rescheduled and performed on Aug 10, 2020   Recent -  Interim CV studies:   The following studies were reviewed today: Transthoracic Echo 09/29/2020: Hyperdynamic left ventricle with a EF of> 75%.  No R WMA.  Mild LVH-indeterminate diastolic parameters mild LA dilation.  Moderate AS /mild AI - Mean gradient 25 mmHg.  Mild MR.  Elevated RAP.  Inerval History   Kenneth [name] is being evaluated today via telehealth.  He is accompanied by his wife who thankfully is actually starting to show signs of significant recovery related to her cancer therapy.  She is still using a walker, but hoping to transfer to a cane soon.  She is able to do ADLs including cooking and showering.  She is always been Guerino's main caregiver, but there has been role reversal over the last year or so because of her illness.  Antwone was pretty quiet today, but does indicate that he is just chronically fatigued and gets short of breath just walking around the house.  He then insists that he is still able to out do several younger men when it comes to putting of the fence on his property.  He then says  that as long as he is able to go slow or moderate pace, he usually is okay, but if he does any more than that he will get short of breath.  This is contradictory to the statement of getting short of breath walking around the house.  He denies any chest pain or pressure with rest or exertion which was similar to the case when he had a stents placed.  He has not had any PND or orthopnea and does not have resting dyspnea.  No notable edema.  Rare occasional palpitations but nothing overwhelming.  He may get dizzy or lightheaded sometimes when he first stands up but has not had syncope or near syncope.  Per the urologist, he has passed now at least all but 1 somewhat larger stone.  He has had some microscopic hematuria but no visible hematuria.   ROS:  Please see the history of present illness.     Review of Systems  Constitutional:  Positive for malaise/fatigue and weight loss.  HENT:   Negative for congestion and sinus pain.   Respiratory:  Positive for wheezing (According to him, no more than usual). Negative for cough.   Cardiovascular:  Positive for palpitations (Very rare). Negative for leg swelling.  Gastrointestinal:  Negative for abdominal pain (Not now since he has passed his stones), blood in stool and melena.  Genitourinary:  Negative for hematuria (Reportedly microscopic, but no microscopic).  Musculoskeletal:  Positive for joint pain (-But they do not stop him).  Neurological:  Positive for dizziness (Sometimes if stands up too quick). Negative for focal weakness and weakness.  Psychiatric/Behavioral:  Positive for depression. The patient is nervous/anxious.        He seems to be somewhat dejected.  Not very talkative the day.   Past Medical History:  Diagnosis Date   Arthritis    CAD S/P percutaneous coronary angioplasty cardiologist-  dr Ellyn Hack    06/28/16 --PCI with Synergy DES 2.75 mm 20 mm-->m RCA; 07/10/16 PCI with Synergy DES 3.5 mm x 20 mm-->mLAD   Chronic diastolic heart failure ([place])    followed by dr Loraine Bhullar   Chronic dryness of both eyes    Diverticulosis of colon    Dyspnea    with exertion    Dyspnea on exertion    Epiretinal membrane    "epiretinal attachment" (06/28/2016) 10-14-2017 per pt oringinally in both , now only unilateral    GERD (gastroesophageal reflux disease)    Heart murmur    Hiatal hernia    History of adenomatous polyp of colon    History of concussion 08/23/2017   w/o loc--- per pt no residual   History of kidney stones    History of rheumatic fever 1958   History of skin cancer    excision leg;  froze the left arm--- unsure BCC or SCC    Hyperlipidemia    Hypertension    ILD (interstitial lung disease) ([place])    pulmologist-  dr Elsworth Soho--  secondary to methotrexate use --- last PFTs 04-25-2015  mild restriction   Iron deficiency anemia    Mild obstructive sleep apnea    10-04-2019 per pt  had a sleep study years ago ,  was told no cpap recommended pt denies    Nephrolithiasis    CT 09-07-2019 bilateral nonobstructive stones   Paroxysmal atrial fibrillation [facility]) cardiologist-  dr Dennies Coate/  EP-- dr Rayann Heman   a. 11/2003 Tikosyn initiated - subsequently d/c'd;  b. 2006 Terril for Afib @ [employer];  c. 09/2011 Echo: EF 60-65%, Gr 2 DD;  d. DCCV 10/2011 , 04/2012, 12/ 2014, & 02/ 2015;  e. RFCA 01-13-2014   Plaque psoriasis 1969   followed by rheumatologist  and dermatologist @ [facility] in [place]   Right ureteral stone    S/P drug eluting coronary stent placement    06-28-2016  x1 to mRCA;  07-10-2016 x1 to mLAD   Seasonal allergies    Type 2 diabetes mellitus ([place])    followed by pcp   Ureteral calculus, right    Wears hearing aid in both ears    Past Surgical History:  Procedure Laterality Date   [place] N/A 01/13/2014   repeat PVI, also left atrial ablation performed with successfull ablation of LA flutter by Dr Rayann Heman   ATRIAL FIBRILLATION ABLATION  08/2004   Dr Rolland Porter at [facility] , [place])   [place]  06-30-2003  dr Lia Foyer   no significant coronary obstruction, well preserved LVF, recurrent afib   CARDIOVERSION N/A 05/22/2013   Procedure: CARDIOVERSION;  Surgeon: [name], MD;  Location: [place];  Service: Cardiovascular;  Laterality: N/A;   CARDIOVERSION N/A 10/23/2011   Procedure: CARDIOVERSION;  Surgeon: Deboraha Sprang, MD;  Location: [facility] CATH LAB;  Service: Cardiovascular;  Laterality: N/A;   CARDIOVERSION N/A 05/01/2012   Procedure: CARDIOVERSION;  Surgeon: Deboraha Sprang, MD;  Location: [facility] CATH LAB;  Service: Cardiovascular;  Laterality: N/A;   CARPAL TUNNEL RELEASE Right 1980s   CATARACT EXTRACTION W/ INTRAOCULAR LENS  IMPLANT, BILATERAL  2012  approx.   CORONARY STENT INTERVENTION N/A 06/28/2016   Procedure: Coronary Stent Intervention;  Surgeon: Leonie Man, MD;  Location: [place] CV LAB;  Service: Cardiovascular;  Laterality: mRCA  PCI -Synergy DES 2.75 m x 20 mm (3.1 mm)   CORONARY STENT INTERVENTION N/A 07/10/2016   Procedure: Coronary Stent Intervention;  Surgeon: Leonie Man, MD;  Location: [place] CV LAB;  Service: Cardiovascular: mLAD PCI Synergy DES 3.5 mm x 20 mm)   CYSTOSCOPY WITH RETROGRADE PYELOGRAM, URETEROSCOPY AND STENT PLACEMENT Right 10/16/2017   Procedure: CYSTOSCOPY WITH RIGHT RETROGRADE RIGHT URETEROSCOPY AND STONE BASKET EXTRACTION;  Surgeon: Irine Seal, MD;  Location: [facility];  Service: Urology;  Laterality: Right;   CYSTOSCOPY WITH RETROGRADE PYELOGRAM, URETEROSCOPY AND STENT PLACEMENT Right 10/08/2019   Procedure: CYSTOSCOPY WITH URETEROSCOPY; RETROGRADE PYELOGRAM; STONE BASKET EXTRACTION;  STENT PLACEMENT;  Surgeon: Cleon Gustin, MD;  Location[facility];  Service: Urology;  Laterality: Right;  1 HR   CYSTOSCOPY WITH RETROGRADE PYELOGRAM, URETEROSCOPY AND STENT PLACEMENT Right 03/28/2020   Procedure: CYSTOSCOPY WITH RIGHT RETROGRADE PYELOGRAM, URETEROSCOPY WITH HOLMIUM LASER AND STENT PLACEMENT;  Surgeon: Irine Seal, MD;  Location: [facility];  Service: Urology;  Laterality: Right;   CYSTOSCOPY/RETROGRADE/URETEROSCOPY/STONE EXTRACTION WITH BASKET Right 10/27/2017   Procedure: CYSTOSCOPY/RETROGRADE/URETEROSCOPY/STONE EXTRACTION WITH BASKET/URETERAL STENT PLACEMENT and laser;  Surgeon: Irine Seal, MD;  Location: [facility];  Service: Urology;  Laterality: Right;   EXTRACORPOREAL SHOCK WAVE LITHOTRIPSY Left 08/10/2020   Procedure: EXTRACORPOREAL SHOCK WAVE LITHOTRIPSY (ESWL);  Surgeon: Ardis Hughs, MD;  Location[facility];  Service: Urology;  Laterality: Left;   HOLMIUM LASER APPLICATION Right 06/21/4006   Procedure: POSSIBLE HOLMIUM LASER APPLICATION;  Surgeon: Irine Seal, MD;  Location: [facility];  Service: Urology;  Laterality: Right;   HOLMIUM LASER APPLICATION Right 09/12/6193   Procedure: HOLMIUM LASER APPLICATION;  Surgeon: Cleon Gustin, MD;  Location: [place];  Service: Urology;  Laterality: Right;   INTRAVASCULAR PRESSURE WIRE/FFR STUDY N/A 06/28/2016   Procedure: Intravascular Pressure Wire/FFR Study;  Surgeon: Leonie Man, MD;  Location: [place] CV LAB;  Service: Cardiovascular: FFR of mLAD ~65% lesion = 0.75 post --> Staged PCI   NASAL SINUS SURGERY     x 2   PROXIMAL INTERPHALANGEAL FUSION (PIP) Left 01-05-2001    dr graves   correction clawtoe and extensor tendon lengthening   RIGHT/LEFT HEART CATH AND CORONARY ANGIOGRAPHY N/A 06/28/2016   Procedure: Right/Left Heart Cath and Coronary Angiography;  Surgeon: Leonie Man, MD;  Location: [place] CV LAB;  Service: Cardiovascular: mRCA 99% (TIMI 2), mLAD ~65% (FFR 0.75), OM3 55%. mild Pulm HTN. Mod LVEDP elevation. EF 55-65%.   ROTATOR CUFF REPAIR Bilateral last one 2014   TONSILLECTOMY  age 89   URETEROSCOPIC LASER LITHOTRIPSY STONE EXTRACTIONS/  STENT PLACEMENT Bilateral 09-10-2007   dr Jeffie Pollock  [facility]   per pt has had several prior ureteroscopic stone extraction since age 7 , last one 09-10-2007    07/2016 - after staged PCI of LAD    Current Meds  Medication Sig   acetaminophen (TYLENOL) 500 MG tablet Take 1,000 mg by mouth 2 (two) times daily as needed (for pain.).   Adalimumab 40 MG/0.4ML PSKT Inject 40 mg into the skin every 14 (fourteen) days.    albuterol (VENTOLIN HFA) 108 (90 Base) MCG/ACT inhaler Inhale 2 puffs into the lungs every 6 (six) hours as needed for wheezing or shortness of breath.   aspirin EC 81 MG tablet Take 1 tablet (81 mg total) by mouth daily. Do not start until MAY 8,2020 (Patient taking differently: Take 81 mg by mouth daily.)   calcipotriene (DOVONOX) 0.005 % cream Apply 1 application topically 2 (two) times daily as needed (psoriasis).   cetirizine (ZYRTEC) 10 MG tablet Take 10 mg by mouth daily.   diltiazem (CARDIZEM CD) 180 MG 24 hr capsule Take 1 capsule by mouth once daily   ferrous sulfate 325 (65 FE)  MG tablet Take 325 mg by mouth daily.   gabapentin (NEURONTIN) 300 MG capsule Take 300-600 mg by mouth See admin instructions. Take 300 mg in the morning and 600 mg in th evening   galantamine (RAZADYNE ER) 8 MG 24 hr capsule Take 16 mg by mouth daily with breakfast.   memantine (NAMENDA) 10 MG tablet Take 20 mg by mouth at bedtime.   metFORMIN (GLUCOPHAGE-XR) 500 MG 24 hr tablet Take 500 mg by mouth daily at 12 noon.   Polyethyl Glycol-Propyl Glycol (SYSTANE OP) Place 1 drop into both eyes daily.   rosuvastatin (CRESTOR) 20 MG tablet Take 1 tablet (20 mg total) by mouth every evening.   tamsulosin (FLOMAX) 0.4 MG CAPS capsule Take 0.4 mg by mouth every evening.      Allergies:   Hydrocodone, Other, Oxycodone, Pseudoephedrine, Tramadol, and Gemfibrozil   Social History   Tobacco Use   Smoking status: Former    Pack years: 0.00    Types: Cigars    Quit date: 09/29/1997    Years since quitting: 23.0   Smokeless tobacco: Former    Types: Snuff, Chew   Tobacco comments:    4-5 per day at his max  Vaping Use   Vaping Use: Never used  Substance Use Topics   Alcohol use: No   Drug use: No     Family Hx: The patient's family history includes Breast cancer  in his mother; Colon cancer in his mother; Coronary artery disease in his mother and paternal grandfather; Heart disease in his maternal grandmother; Stroke in his paternal grandfather. There is no history of Esophageal cancer, Stomach cancer, or Rectal cancer.   Labs/Other Tests and Data Reviewed:    EKG:  No ECG reviewed.  Recent Labs: 08/14/2020: ALT 18; BUN 12; Creatinine, Ser 1.15; Potassium 3.6; Sodium 137 09/12/2020: Hemoglobin 10.4; Platelets 176.0; Pro B Natriuretic peptide (BNP) 82.0   Recent Lipid Panel Lab Results  Component Value Date/Time   CHOL 174 05/12/2017 11:30 AM   TRIG 197 (H) 05/12/2017 11:30 AM   HDL 49 05/12/2017 11:30 AM   CHOLHDL 3.6 05/12/2017 11:30 AM   CHOLHDL 9.5 08/17/2009 04:44 AM   LDLCALC 86  05/12/2017 11:30 AM    Wt Readings from Last 3 Encounters:  10/03/20 222 lb (100.7 kg)  09/13/20 231 lb 12.8 oz (105.1 kg)  09/12/20 231 lb 9.6 oz (105.1 kg)     Objective:    Vital Signs:  BP (!) 143/82   Pulse 85   Ht 6\' 3"  (1.905 m)   Wt 222 lb (100.7 kg)   BMI 27.75 kg/m   VITAL SIGNS:  reviewed Pleasant male in no obvious acute distress, but clearly not in good spirits and not happy with his symptoms. A&O x 3.  Somewhat down Mood & Affect Non-labored respirations  ==========================================  COVID-19 Education: The signs and symptoms of COVID-19 were discussed with the patient and how to seek care for testing (follow up with PCP or arrange E-visit).   The importance of social distancing was discussed today.  Time:   Today, I have spent 28 minutes with the patient with telehealth technology discussing the above problems.   An additional 10 minutes spent charting (reviewing prior notes, hospital records, studies, labs etc.) Total 38 minutes   Medication Adjustments/Labs and Tests Ordered: Current medicines are reviewed at length with the patient today.  Concerns regarding medicines are outlined above.   Patient Instructions  Medication Instructions:   No changes for now  *If you need a refill on your cardiac medications before your next appointment, please call your pharmacy*   Lab Work:  [place] check fasting lipids  If you have labs (blood work) drawn today and your tests are completely normal, you will receive your results only by: [place] (if you have MyChart) OR A paper copy in the mail If you have any lab test that is abnormal or we need to change your treatment, we will call you to review the results.   Testing/Procedures:  RIGHT AND LEFT HEART CATHETERIZATION WITH CORONARY ANGIOGRAPHY AND POSSIBLE PERCUTANEOUS CORONARY INTERVENTION  Shared Decision Making/Informed Consent{  The risks [stroke (1 in 1000),  death (1 in 6), kidney failure [usually temporary] (1 in 500), bleeding (1 in 200), allergic reaction [possibly serious] (1 in 200)], benefits (diagnostic support and management of coronary artery disease) and alternatives of a cardiac catheterization were discussed in detail with Mr. Sia and he is willing to proceed.  Your physician has requested that you have a cardiac catheterization. Cardiac catheterization is used to diagnose and/or treat various heart conditions. Doctors may recommend this procedure for a number of different reasons. The most common reason is to evaluate chest pain. Chest pain can be a symptom of coronary artery disease (CAD), and cardiac catheterization can show whether plaque is narrowing or blocking your heart's arteries. This procedure is also used to evaluate the valves,  as well as measure the blood flow and oxygen levels in different parts of your heart. For further information please visit [email or web address]. Please follow instruction sheet, as given.    Follow-Up: At [facility], you and your health needs are our priority.  As part of our continuing mission to provide you with exceptional heart care, we have created designated Provider Care Teams.  These Care Teams include your primary Cardiologist (physician) and Advanced Practice Providers (APPs -  Physician Assistants and Nurse Practitioners) who all work together to provide you with the care you need, when you need it.  We recommend signing up for the patient portal called "MyChart".  Sign up information is provided on this After Visit Summary.  MyChart is used to connect with patients for Virtual Visits (Telemedicine).  Patients are able to view lab/test results, encounter notes, upcoming appointments, etc.  Non-urgent messages can be sent to your provider as well.   To learn more about what you can do with MyChart, go to [email or web address].    Your next appointment:   ~1 month(s)  The format for your  next appointment:   Can be either in person or virtual (preferably in person)  Provider:   You may see [name], MD or one of the following Advanced Practice Providers on your designated Care Team:   Coletta Memos, NP Rosaria Ferries, PA-C Jory Sims, DNP, ANP   Other Instructions  For now, may be take it easy until we are able to get the cardiac catheterization scheduled.    Signed, [name], MD  10/03/2020 12:39 PM    [place]

## 2020-10-03 NOTE — Telephone Encounter (Signed)
  Patient Consent for Virtual Visit        [name] has provided verbal consent on 10/03/2020 for a virtual visit (video or telephone).   CONSENT FOR VIRTUAL VISIT FOR:  Kenneth [name]  By participating in this virtual visit I agree to the following:  I hereby voluntarily request, consent and authorize [facility] and its employed or contracted physicians, physician assistants, nurse practitioners or other licensed health care professionals (the Practitioner), to provide me with telemedicine health care services (the "Services") as deemed necessary by the treating Practitioner. I acknowledge and consent to receive the Services by the Practitioner via telemedicine. I understand that the telemedicine visit will involve communicating with the Practitioner through live audiovisual communication technology and the disclosure of certain medical information by electronic transmission. I acknowledge that I have been given the opportunity to request an in-person assessment or other available alternative prior to the telemedicine visit and am voluntarily participating in the telemedicine visit.  I understand that I have the right to withhold or withdraw my consent to the use of telemedicine in the course of my care at any time, without affecting my right to future care or treatment, and that the Practitioner or I may terminate the telemedicine visit at any time. I understand that I have the right to inspect all information obtained and/or recorded in the course of the telemedicine visit and may receive copies of available information for a reasonable fee.  I understand that some of the potential risks of receiving the Services via telemedicine include:  Delay or interruption in medical evaluation due to technological equipment failure or disruption; Information transmitted may not be sufficient (e.g. poor resolution of images) to allow for appropriate medical decision making by the Practitioner;  and/or  In rare instances, security protocols could fail, causing a breach of personal health information.  Furthermore, I acknowledge that it is my responsibility to provide information about my medical history, conditions and care that is complete and accurate to the best of my ability. I acknowledge that Practitioner's advice, recommendations, and/or decision may be based on factors not within their control, such as incomplete or inaccurate data provided by me or distortions of diagnostic images or specimens that may result from electronic transmissions. I understand that the practice of medicine is not an exact science and that Practitioner makes no warranties or guarantees regarding treatment outcomes. I acknowledge that a copy of this consent can be made available to me via my patient portal ([facility]), or I can request a printed copy by calling the office of [facility].    I understand that my insurance will be billed for this visit.   I have read or had this consent read to me. I understand the contents of this consent, which adequately explains the benefits and risks of the Services being provided via telemedicine.  I have been provided ample opportunity to ask questions regarding this consent and the Services and have had my questions answered to my satisfaction. I give my informed consent for the services to be provided through the use of telemedicine in my medical care

## 2020-10-03 NOTE — Telephone Encounter (Signed)
RN spoke to patient and wife . Instruction were given  from today's virtual visit 10/03/20 .  AVS SUMMARY will be given to patient when he comes for lab work this week. June 30 or July 1,2022   Instruction  and direction given for upcoming right and left heart cath schedule for July 6,2022 . Patien is to arrive at 8 am  at main entrance A at [facility].   Wife verbalized understanding .

## 2020-10-05 DIAGNOSIS — E1169 Type 2 diabetes mellitus with other specified complication: Secondary | ICD-10-CM | POA: Diagnosis not present

## 2020-10-05 DIAGNOSIS — I25119 Atherosclerotic heart disease of native coronary artery with unspecified angina pectoris: Secondary | ICD-10-CM | POA: Diagnosis not present

## 2020-10-05 DIAGNOSIS — E785 Hyperlipidemia, unspecified: Secondary | ICD-10-CM | POA: Diagnosis not present

## 2020-10-05 DIAGNOSIS — I10 Essential (primary) hypertension: Secondary | ICD-10-CM | POA: Diagnosis not present

## 2020-10-05 DIAGNOSIS — R0600 Dyspnea, unspecified: Secondary | ICD-10-CM | POA: Diagnosis not present

## 2020-10-05 DIAGNOSIS — I5032 Chronic diastolic (congestive) heart failure: Secondary | ICD-10-CM | POA: Diagnosis not present

## 2020-10-05 LAB — COMPREHENSIVE METABOLIC PANEL
ALT: 26 IU/L (ref 0–44)
AST: 28 IU/L (ref 0–40)
Albumin/Globulin Ratio: 1.8 (ref 1.2–2.2)
Albumin: 4.6 g/dL (ref 3.7–4.7)
Alkaline Phosphatase: 78 IU/L (ref 44–121)
BUN/Creatinine Ratio: 13 (ref 10–24)
BUN: 12 mg/dL (ref 8–27)
Bilirubin Total: 0.4 mg/dL (ref 0.0–1.2)
CO2: 23 mmol/L (ref 20–29)
Calcium: 9.9 mg/dL (ref 8.6–10.2)
Chloride: 99 mmol/L (ref 96–106)
Creatinine, Ser: 0.96 mg/dL (ref 0.76–1.27)
Globulin, Total: 2.6 g/dL (ref 1.5–4.5)
Glucose: 133 mg/dL — ABNORMAL HIGH (ref 65–99)
Potassium: 3.9 mmol/L (ref 3.5–5.2)
Sodium: 136 mmol/L (ref 134–144)
Total Protein: 7.2 g/dL (ref 6.0–8.5)
eGFR: 82 mL/min/{1.73_m2} (ref >59)

## 2020-10-05 LAB — LIPID PANEL
Chol/HDL Ratio: 3.1 ratio (ref 0.0–5.0)
Cholesterol, Total: 165 mg/dL (ref 100–199)
HDL: 53 mg/dL (ref >39)
LDL Chol Calc (NIH): 70 mg/dL (ref 0–99)
Triglycerides: 259 mg/dL — ABNORMAL HIGH (ref 0–149)
VLDL Cholesterol Cal: 42 mg/dL — ABNORMAL HIGH (ref 5–40)

## 2020-10-05 LAB — CBC
Hematocrit: 37 % — ABNORMAL LOW (ref 37.5–51.0)
Hemoglobin: 11.7 g/dL — ABNORMAL LOW (ref 13.0–17.7)
MCH: 26.2 pg — ABNORMAL LOW (ref 26.6–33.0)
MCHC: 31.6 g/dL (ref 31.5–35.7)
MCV: 83 fL (ref 79–97)
Platelets: 242 10*3/uL (ref 150–450)
RBC: 4.46 x10E6/uL (ref 4.14–5.80)
RDW: 15.9 % — ABNORMAL HIGH (ref 11.6–15.4)
WBC: 6.8 10*3/uL (ref 3.4–10.8)

## 2020-10-06 DIAGNOSIS — N200 Calculus of kidney: Secondary | ICD-10-CM | POA: Diagnosis not present

## 2020-10-10 ENCOUNTER — Telehealth: Payer: Self-pay | Admitting: *Deleted

## 2020-10-10 NOTE — Telephone Encounter (Signed)
Pt contacted pre-catheterization scheduled at [facility] for: Wednesday October 11, 2020 10 AM Verified arrival time and place: [place] [facility]) at: 8 AM   No solid food after midnight prior to cath, clear liquids until 5 AM day of procedure.  Hold: Metformin-none day of procedure and 48 hours post procedure  Except hold medications AM meds can be  taken pre-cath with sips of water including: aspirin 81 mg   Confirmed patient has responsible adult to drive home post procedure and be with patient first 24 hours after arriving home: yes  You are allowed ONE visitor in the waiting room during the time you are at the hospital for your procedure. Both you and your visitor must wear a mask once you enter the hospital.   Patient reports does not currently have any symptoms concerning for COVID-19 and no household members with COVID-19 like illness.      Reviewed procedure/mask/visitor instructions with patient's wife (with patient's permission).

## 2020-10-11 ENCOUNTER — Other Ambulatory Visit: Payer: Self-pay

## 2020-10-11 ENCOUNTER — Ambulatory Visit (HOSPITAL_COMMUNITY)
Admission: RE | Admit: 2020-10-11 | Discharge: 2020-10-11 | Disposition: A | Discharge status: Home / Self Care | Payer: Medicare Other | Attending: Cardiology | Admitting: Cardiology

## 2020-10-11 ENCOUNTER — Encounter (HOSPITAL_COMMUNITY)
Admission: RE | Disposition: A | Discharge status: Home / Self Care | Payer: Self-pay | Source: Home / Self Care | Attending: Cardiology

## 2020-10-11 ENCOUNTER — Other Ambulatory Visit (HOSPITAL_COMMUNITY): Payer: Self-pay

## 2020-10-11 DIAGNOSIS — Z7902 Long term (current) use of antithrombotics/antiplatelets: Secondary | ICD-10-CM | POA: Diagnosis not present

## 2020-10-11 DIAGNOSIS — I5032 Chronic diastolic (congestive) heart failure: Secondary | ICD-10-CM | POA: Insufficient documentation

## 2020-10-11 DIAGNOSIS — E119 Type 2 diabetes mellitus without complications: Secondary | ICD-10-CM | POA: Insufficient documentation

## 2020-10-11 DIAGNOSIS — E1169 Type 2 diabetes mellitus with other specified complication: Secondary | ICD-10-CM | POA: Diagnosis present

## 2020-10-11 DIAGNOSIS — Z9861 Coronary angioplasty status: Secondary | ICD-10-CM

## 2020-10-11 DIAGNOSIS — R0609 Other forms of dyspnea: Secondary | ICD-10-CM | POA: Diagnosis not present

## 2020-10-11 DIAGNOSIS — Z79899 Other long term (current) drug therapy: Secondary | ICD-10-CM | POA: Diagnosis not present

## 2020-10-11 DIAGNOSIS — J849 Interstitial pulmonary disease, unspecified: Secondary | ICD-10-CM | POA: Insufficient documentation

## 2020-10-11 DIAGNOSIS — Z885 Allergy status to narcotic agent status: Secondary | ICD-10-CM | POA: Diagnosis not present

## 2020-10-11 DIAGNOSIS — Z7984 Long term (current) use of oral hypoglycemic drugs: Secondary | ICD-10-CM | POA: Diagnosis not present

## 2020-10-11 DIAGNOSIS — I2510 Atherosclerotic heart disease of native coronary artery without angina pectoris: Secondary | ICD-10-CM

## 2020-10-11 DIAGNOSIS — E785 Hyperlipidemia, unspecified: Secondary | ICD-10-CM | POA: Insufficient documentation

## 2020-10-11 DIAGNOSIS — Z7982 Long term (current) use of aspirin: Secondary | ICD-10-CM | POA: Diagnosis not present

## 2020-10-11 DIAGNOSIS — Z87891 Personal history of nicotine dependence: Secondary | ICD-10-CM | POA: Insufficient documentation

## 2020-10-11 DIAGNOSIS — Z95820 Peripheral vascular angioplasty status with implants and grafts: Secondary | ICD-10-CM

## 2020-10-11 DIAGNOSIS — Z888 Allergy status to other drugs, medicaments and biological substances status: Secondary | ICD-10-CM | POA: Insufficient documentation

## 2020-10-11 DIAGNOSIS — Z955 Presence of coronary angioplasty implant and graft: Secondary | ICD-10-CM | POA: Insufficient documentation

## 2020-10-11 DIAGNOSIS — I110 Hypertensive heart disease with heart failure: Secondary | ICD-10-CM | POA: Insufficient documentation

## 2020-10-11 DIAGNOSIS — I25118 Atherosclerotic heart disease of native coronary artery with other forms of angina pectoris: Secondary | ICD-10-CM | POA: Diagnosis not present

## 2020-10-11 DIAGNOSIS — Z8249 Family history of ischemic heart disease and other diseases of the circulatory system: Secondary | ICD-10-CM | POA: Insufficient documentation

## 2020-10-11 DIAGNOSIS — I25119 Atherosclerotic heart disease of native coronary artery with unspecified angina pectoris: Secondary | ICD-10-CM | POA: Diagnosis present

## 2020-10-11 HISTORY — PX: RIGHT/LEFT HEART CATH AND CORONARY ANGIOGRAPHY: CATH118266

## 2020-10-11 HISTORY — PX: CORONARY STENT INTERVENTION: CATH118234

## 2020-10-11 HISTORY — PX: INTRAVASCULAR PRESSURE WIRE/FFR STUDY: CATH118243

## 2020-10-11 LAB — POCT I-STAT EG7
Acid-base deficit: 1 mmol/L (ref 0.0–2.0)
Acid-base deficit: 2 mmol/L (ref 0.0–2.0)
Bicarbonate: 21.9 mmol/L (ref 20.0–28.0)
Bicarbonate: 23 mmol/L (ref 20.0–28.0)
Calcium, Ion: 1.08 mmol/L — ABNORMAL LOW (ref 1.15–1.40)
Calcium, Ion: 1.11 mmol/L — ABNORMAL LOW (ref 1.15–1.40)
HCT: 34 % — ABNORMAL LOW (ref 39.0–52.0)
HCT: 35 % — ABNORMAL LOW (ref 39.0–52.0)
Hemoglobin: 11.6 g/dL — ABNORMAL LOW (ref 13.0–17.0)
Hemoglobin: 11.9 g/dL — ABNORMAL LOW (ref 13.0–17.0)
O2 Saturation: 74 %
O2 Saturation: 75 %
Potassium: 3.6 mmol/L (ref 3.5–5.1)
Potassium: 3.8 mmol/L (ref 3.5–5.1)
Sodium: 142 mmol/L (ref 135–145)
Sodium: 143 mmol/L (ref 135–145)
TCO2: 23 mmol/L (ref 22–32)
TCO2: 24 mmol/L (ref 22–32)
pCO2, Ven: 34.7 mmHg — ABNORMAL LOW (ref 44.0–60.0)
pCO2, Ven: 35.6 mmHg — ABNORMAL LOW (ref 44.0–60.0)
pH, Ven: 7.409 (ref 7.250–7.430)
pH, Ven: 7.418 (ref 7.250–7.430)
pO2, Ven: 39 mmHg (ref 32.0–45.0)
pO2, Ven: 39 mmHg (ref 32.0–45.0)

## 2020-10-11 LAB — POCT ACTIVATED CLOTTING TIME
Activated Clotting Time: 225 seconds
Activated Clotting Time: 242 seconds
Activated Clotting Time: 237 seconds
Activated Clotting Time: 294 seconds
Activated Clotting Time: 295 seconds

## 2020-10-11 LAB — POCT I-STAT 7, (LYTES, BLD GAS, ICA,H+H)
Acid-Base Excess: 1 mmol/L (ref 0.0–2.0)
Bicarbonate: 23.4 mmol/L (ref 20.0–28.0)
Calcium, Ion: 1.18 mmol/L (ref 1.15–1.40)
HCT: 36 % — ABNORMAL LOW (ref 39.0–52.0)
Hemoglobin: 12.2 g/dL — ABNORMAL LOW (ref 13.0–17.0)
O2 Saturation: 100 %
Potassium: 4.1 mmol/L (ref 3.5–5.1)
Sodium: 140 mmol/L (ref 135–145)
TCO2: 24 mmol/L (ref 22–32)
pCO2 arterial: 31.1 mmHg — ABNORMAL LOW (ref 32.0–48.0)
pH, Arterial: 7.484 — ABNORMAL HIGH (ref 7.350–7.450)
pO2, Arterial: 156 mmHg — ABNORMAL HIGH (ref 83.0–108.0)

## 2020-10-11 LAB — GLUCOSE, CAPILLARY: Glucose-Capillary: 146 mg/dL — ABNORMAL HIGH (ref 70–99)

## 2020-10-11 SURGERY — RIGHT/LEFT HEART CATH AND CORONARY ANGIOGRAPHY
Anesthesia: LOCAL

## 2020-10-11 MED ORDER — HEPARIN (PORCINE) IN NACL 1000-0.9 UT/500ML-% IV SOLN
INTRAVENOUS | Status: AC
  Filled 2020-10-11: qty 500

## 2020-10-11 MED ORDER — SODIUM CHLORIDE 0.9 % IV SOLN: INTRAVENOUS | Status: DC

## 2020-10-11 MED ORDER — HEPARIN SODIUM (PORCINE) 1000 UNIT/ML IJ SOLN
INTRAMUSCULAR | Status: DC | PRN
  Administered 2020-10-11: 3000 [IU] via INTRAVENOUS
  Administered 2020-10-11: 5000 [IU] via INTRAVENOUS
  Administered 2020-10-11: 4000 [IU] via INTRAVENOUS
  Administered 2020-10-11: 5000 [IU] via INTRAVENOUS
  Administered 2020-10-11: 3000 [IU] via INTRAVENOUS

## 2020-10-11 MED ORDER — HEPARIN (PORCINE) IN NACL 1000-0.9 UT/500ML-% IV SOLN
INTRAVENOUS | Status: AC
  Filled 2020-10-11: qty 1000

## 2020-10-11 MED ORDER — HEPARIN SODIUM (PORCINE) 1000 UNIT/ML IJ SOLN
INTRAMUSCULAR | Status: AC
  Filled 2020-10-11: qty 1

## 2020-10-11 MED ORDER — IOHEXOL 350 MG/ML SOLN
INTRAVENOUS | Status: DC | PRN
  Administered 2020-10-11: 195 mL

## 2020-10-11 MED ORDER — CLOPIDOGREL BISULFATE 75 MG PO TABS: 75.0000 mg | ORAL_TABLET | Freq: Every day | ORAL | 0 refills | Status: DC

## 2020-10-11 MED ORDER — CLOPIDOGREL BISULFATE 300 MG PO TABS
ORAL_TABLET | ORAL | Status: AC
  Filled 2020-10-11: qty 2

## 2020-10-11 MED ORDER — CLOPIDOGREL BISULFATE 300 MG PO TABS
ORAL_TABLET | ORAL | Status: DC | PRN
  Administered 2020-10-11: 600 mg via ORAL

## 2020-10-11 MED ORDER — HYDRALAZINE HCL 20 MG/ML IJ SOLN: 10.0000 mg | INTRAMUSCULAR | Status: AC | PRN

## 2020-10-11 MED ORDER — NITROGLYCERIN 1 MG/10 ML FOR IR/CATH LAB
INTRA_ARTERIAL | Status: DC | PRN
  Administered 2020-10-11 (×3): 200 ug

## 2020-10-11 MED ORDER — SODIUM CHLORIDE 0.9% FLUSH
3.0000 mL | INTRAVENOUS | Status: DC | PRN
Start: 2020-10-11 — End: 2020-10-12

## 2020-10-11 MED ORDER — LIDOCAINE HCL (PF) 1 % IJ SOLN
INTRAMUSCULAR | Status: DC | PRN
  Administered 2020-10-11: 2 mL

## 2020-10-11 MED ORDER — SODIUM CHLORIDE 0.9 % WEIGHT BASED INFUSION: 1.0000 mL/kg/h | INTRAVENOUS | Status: DC

## 2020-10-11 MED ORDER — SODIUM CHLORIDE 0.9 % WEIGHT BASED INFUSION
3.0000 mL/kg/h | INTRAVENOUS | Status: AC
Start: 2020-10-11 — End: 2020-10-11
  Administered 2020-10-11: 3 mL/kg/h via INTRAVENOUS

## 2020-10-11 MED ORDER — ADENOSINE 12 MG/4ML IV SOLN
INTRAVENOUS | Status: AC
  Filled 2020-10-11: qty 16

## 2020-10-11 MED ORDER — ANGIOPLASTY BOOK
Status: AC
  Filled 2020-10-11: qty 1

## 2020-10-11 MED ORDER — CLOPIDOGREL BISULFATE 75 MG PO TABS: 75.0000 mg | ORAL_TABLET | Freq: Every day | ORAL | Status: DC

## 2020-10-11 MED ORDER — SODIUM CHLORIDE 0.9 % IV SOLN: 250.0000 mL | INTRAVENOUS | Status: DC | PRN

## 2020-10-11 MED ORDER — LABETALOL HCL 5 MG/ML IV SOLN: 10.0000 mg | INTRAVENOUS | Status: AC | PRN

## 2020-10-11 MED ORDER — CLOPIDOGREL BISULFATE 75 MG PO TABS
75.0000 mg | ORAL_TABLET | Freq: Every day | ORAL | 2 refills | Status: DC
Start: 2020-10-11 — End: 2022-06-28
  Filled 2020-10-11: qty 90, 90d supply, fill #0

## 2020-10-11 MED ORDER — ADENOSINE (DIAGNOSTIC) 140MCG/KG/MIN
INTRAVENOUS | Status: DC | PRN
  Administered 2020-10-11: 140 ug/kg/min via INTRAVENOUS

## 2020-10-11 MED ORDER — ONDANSETRON HCL 4 MG/2ML IJ SOLN: 4.0000 mg | Freq: Four times a day (QID) | INTRAMUSCULAR | Status: DC | PRN

## 2020-10-11 MED ORDER — SODIUM CHLORIDE 0.9% FLUSH: 3.0000 mL | INTRAVENOUS | Status: DC | PRN

## 2020-10-11 MED ORDER — ASPIRIN EC 81 MG PO TBEC: 81.0000 mg | DELAYED_RELEASE_TABLET | Freq: Every day | ORAL | 0 refills | Status: DC

## 2020-10-11 MED ORDER — SODIUM CHLORIDE 0.9% FLUSH: 3.0000 mL | Freq: Two times a day (BID) | INTRAVENOUS | Status: DC

## 2020-10-11 MED ORDER — HEPARIN (PORCINE) IN NACL 1000-0.9 UT/500ML-% IV SOLN
INTRAVENOUS | Status: DC | PRN
  Administered 2020-10-11 (×3): 500 mL

## 2020-10-11 MED ORDER — VERAPAMIL HCL 2.5 MG/ML IV SOLN
INTRAVENOUS | Status: AC
  Filled 2020-10-11: qty 2

## 2020-10-11 MED ORDER — LIDOCAINE HCL (PF) 1 % IJ SOLN
INTRAMUSCULAR | Status: AC
  Filled 2020-10-11: qty 30

## 2020-10-11 MED ORDER — NITROGLYCERIN 1 MG/10 ML FOR IR/CATH LAB
INTRA_ARTERIAL | Status: AC
  Filled 2020-10-11: qty 10

## 2020-10-11 MED ORDER — VERAPAMIL HCL 2.5 MG/ML IV SOLN
INTRAVENOUS | Status: DC | PRN
  Administered 2020-10-11: 10 mL via INTRA_ARTERIAL

## 2020-10-11 SURGICAL SUPPLY — 26 items
BALLN SAPPHIRE 2.0X12 (BALLOONS) ×2
BALLN SAPPHIRE 2.25X15 (BALLOONS) ×2
BALLN SAPPHIRE ~~LOC~~ 3.5X12 (BALLOONS) ×1 IMPLANT
BALLOON SAPPHIRE 2.0X12 (BALLOONS) IMPLANT
BALLOON SAPPHIRE 2.25X15 (BALLOONS) IMPLANT
CATH BALLN WEDGE 5F 110CM (CATHETERS) ×1 IMPLANT
CATH OPTITORQUE TIG 4.0 5F (CATHETERS) ×1 IMPLANT
CATH VISTA GUIDE 6FR XB3.5 (CATHETERS) ×1 IMPLANT
DEVICE RAD COMP TR BAND LRG (VASCULAR PRODUCTS) ×1 IMPLANT
GLIDESHEATH SLEND SS 6F .021 (SHEATH) ×1 IMPLANT
GUIDEWIRE .025 260CM (WIRE) ×1 IMPLANT
GUIDEWIRE PRESSURE X 175 (WIRE) ×2 IMPLANT
KIT ENCORE 26 ADVANTAGE (KITS) ×1 IMPLANT
KIT ESSENTIALS PG (KITS) ×1 IMPLANT
KIT HEART LEFT (KITS) ×2 IMPLANT
PACK CARDIAC CATHETERIZATION (CUSTOM PROCEDURE TRAY) ×2 IMPLANT
SHEATH GLIDE SLENDER 4/5FR (SHEATH) ×1 IMPLANT
SHEATH PROBE COVER 6X72 (BAG) ×1 IMPLANT
STENT SYNERGY XD 2.50X32 (Permanent Stent) IMPLANT
SYNERGY XD 2.50X32 (Permanent Stent) ×2 IMPLANT
TRANSDUCER W/STOPCOCK (MISCELLANEOUS) ×2 IMPLANT
TUBING CIL FLEX 10 FLL-RA (TUBING) ×2 IMPLANT
WIRE ASAHI PROWATER 180CM (WIRE) ×1 IMPLANT
WIRE EMERALD 3MM-J .035X150CM (WIRE) ×1 IMPLANT
WIRE HI TORQ VERSACORE-J 145CM (WIRE) ×1 IMPLANT
WIRE RUNTHROUGH .014X180CM (WIRE) ×1 IMPLANT

## 2020-10-11 NOTE — Progress Notes (Signed)
[id number] Education competed with pt and wife who voiced understanding. Stressed importance of plavix with stent. Reviewed NTG use, walking for ex, gave diabetic and heart healthy diets and CRP 2. Referred to [place] CRP 2. Graylon Good RN BSN 10/11/2020 3:17 PM

## 2020-10-11 NOTE — Discharge Summary (Signed)
Discharge Summary for Same Day PCI   Patient ID: Kenneth [name] MRN: [id number]; DOB: [birth year]  Admit date: 10/11/2020 Discharge date: 10/11/2020  Primary Care Provider: Clinic, [facility]  Primary Cardiologist: [name], MD  Primary Electrophysiologist:  None   Discharge Diagnoses    Principal Problem:   Coronary artery disease involving native coronary artery with angina pectoris [facility]) Active Problems:   Exertional dyspnea - Class III Angina   CAD S/P percutaneous coronary angioplasty   Hyperlipidemia associated with type 2 diabetes mellitus [facility])    Diagnostic Studies/Procedures    Cardiac Catheterization 10/11/2020:  Previously placed Mid RCA stent (SYNERGY DES 2.75 X 20) is widely patent. Previously placed Prox LAD to Mid LAD stent (SYNERGY DES 3.5 X 20)) is widely patent. CULPRIT LESION: Mid LAD to Dist LAD lesion is 70% stenosed - just beyond 2nd Diag that has an ostial 60% stenosis FFR of mid LAD = 0.75 (Significant) A drug-eluting stent was successfully placed overlapping the previously placed stent and crossing 2 diagonal branches, using a SYNERGY XD 2.50X32. -> The overlap segment was postdilated to 3.7 mm tapered to 3.5 mm and 2.6 mm at the major diagonal branch point Balloon angioplasty was performed on the jailed ostial 2nd Diag lesion using a BALLOON SAPPHIRE 2.0X12. Post intervention, there is a 0% residual stenosis in the new LAD stent with appropriate overlap post elation. Post intervention, there is a 40% residual stenosis in the 2nd Diag sidebranch. Dist Cx lesion is 45% stenosed. ------------------------------ RIGHT HEART CATH NUMBERS ALONG WITH CARDIAC OUTPUT AND INDEX ARE NORMAL. NO PULMONARY HYPERTENSION A drug-eluting stent was successfully placed using a SYNERGY XD 2.50X32.   SUMMARY Two-Vessel CAD: With widely patent stents in the mid RCA and ostial and proximal LAD, Likely culprit lesion was a mid LAD 75% focal eccentric lesion just after  major 2nd Diag (borderline by RFR, positive by FFR) with moderate 40 to 50% stenosis in the ostium of the diagonal branch. Successful FFR guided PCI of the mid LAD placing an overlapping Synergy DES 2.5 mm x 32 mm postdilated at the overlap segment to 3.7 mm Large codominant LCx with minimal disease.  LCx courses of the large lateral OM after giving rise to a large AV groove branch that gives off a major left posterolateral branch. Normal Right Heart Pressures with Normal Cardiac Output and Index.-No evidence of Pulmonary Hypertension     RECOMMENDATIONS We will continue to titrate his medications in the outpatient setting. Has not been on beta-blocker because of fatigue.     [name], MD  Diagnostic Dominance: Right    Intervention    _____________   History of Present Illness     Kenneth [name] is a 75 y.o. male with a PMH of diastolic CHF, atypical atrial flutter, coronary artery disease status post PCI with DES to his RCA 3/18 and staged cardiac catheterization for PCI of mid LAD 4/18.  His PMH also includes hyperlipidemia, essential hypertension, psoriatic arthritis and recurrent nephrolithiasis.   He was seen by Dr. Ellyn Hack back 12/2019 and during that time he reported he was doing well from cardiac standpoint.  He denied chest pain with rest and on exertion.  He was walking 1-2 miles daily and very active working [place].  He plans to build homes for his children and all of his grandchildren on his property.  He was also busy mowing, weed whacking, and almost walking.  He reported he was under an increased amount of stress  acting as the primary caregiver for his wife who has cancer.  She requires total care.   He was noted to have dyspnea on exertion, occasional palpitations and baseline fatigue.  He has known interstitial lung disease and follows with pulmonology.  He denied lower extremity edema, orthopnea, PND, lightheadedness/dizziness and  syncopal/presyncopal events.   He was seen by Geraldo Pitter pulmonology 09/12/2020 for shortness of breath.  He reported that he was not able to walk far distances and that his stamina had decreased.  He reported compliance with his prednisone and noted some improvement with taking prednisone.  He reported that he was having an increased amount of stress due to trauma from his past job in [employer] and continue to care for his wife at home.   He presented to the clinic for follow-up evaluation on 6/8 stating he had noticed increasing shortness of breath over the last several weeks.  He continued to try to maintain his large property but is having more trouble. He was receiving lithotripsy for renal calculi.  He was currently taking iron supplementation only twice per week. Pulmonology has recommended a pulmonary function test. Echo was ordered and seen back in the office. Continued to complain of exertional dyspnea and recommendations for outpatient cardiac cath.   Hospital Course     The patient underwent cardiac cath as noted above with culprit lesion of mLAD 75% which was borderline via RFR and positive by FFR treated with PCI/DESx1 and angioplasty of 2nd diagonal. Plan for DAPT with ASA/plavix for at least one year. The patient was seen by cardiac rehab while in short stay. There were no observed complications post cath. Radial cath site was re-evaluated at 2:50pm with TR band in place and found to be stable without any complications at that time. Instructions/precautions regarding cath site care were given prior to discharge.  [name] was seen by Dr. Ellyn Hack and determined stable for discharge home. Follow up with our office has been arranged. Medications are listed below. Pertinent changes include addition of plavix.  _____________  Cath/PCI Registry Performance & Quality Measures: Aspirin prescribed? - Yes ADP Receptor Inhibitor (Plavix/Clopidogrel, Brilinta/Ticagrelor or  Effient/Prasugrel) prescribed (includes medically managed patients)? - Yes High Intensity Statin (Lipitor 40-80mg  or Crestor 20-40mg ) prescribed? - Yes For EF <40%, was ACEI/ARB prescribed? - Not Applicable (EF >/= 34%) For EF <40%, Aldosterone Antagonist (Spironolactone or Eplerenone) prescribed? - Not Applicable (EF >/= 19%) Cardiac Rehab Phase II ordered (Included Medically managed Patients)? - Yes  _____________   Discharge Vitals Blood pressure (!) 149/54, pulse 72, temperature 97.7 F (36.5 C), temperature source Oral, resp. rate 16, height 6\' 3"  (1.905 m), weight 100.7 kg, SpO2 99 %.  Filed Weights   10/11/20 0749  Weight: 100.7 kg    Last Labs & Radiologic Studies    CBC No results for input(s): WBC, NEUTROABS, HGB, HCT, MCV, PLT in the last 72 hours. Basic Metabolic Panel No results for input(s): NA, K, CL, CO2, GLUCOSE, BUN, CREATININE, CALCIUM, MG, PHOS in the last 72 hours. Liver Function Tests No results for input(s): AST, ALT, ALKPHOS, BILITOT, PROT, ALBUMIN in the last 72 hours. No results for input(s): LIPASE, AMYLASE in the last 72 hours. High Sensitivity Troponin:   No results for input(s): TROPONINIHS in the last 720 hours.  BNP Invalid input(s): POCBNP D-Dimer No results for input(s): DDIMER in the last 72 hours. Hemoglobin A1C No results for input(s): HGBA1C in the last 72 hours. Fasting Lipid Panel No results for  input(s): CHOL, HDL, LDLCALC, TRIG, CHOLHDL, LDLDIRECT in the last 72 hours. Thyroid Function Tests No results for input(s): TSH, T4TOTAL, T3FREE, THYROIDAB in the last 72 hours.  Invalid input(s): FREET3 _____________  CARDIAC CATHETERIZATION  Result Date: 10/11/2020  Previously placed Mid RCA stent (SYNERGY DES 2.75 X 20) is widely patent.  Previously placed Prox LAD to Mid LAD stent (SYNERGY DES 3.5 X 20)) is widely patent.  CULPRIT LESION: Mid LAD to Dist LAD lesion is 70% stenosed - just beyond 2nd Diag that has an ostial 60% stenosis   FFR of mid LAD = 0.75 (Significant)  A drug-eluting stent was successfully placed overlapping the previously placed stent and crossing 2 diagonal branches, using a SYNERGY XD 2.50X32. -> The overlap segment was postdilated to 3.7 mm tapered to 3.5 mm and 2.6 mm at the major diagonal branch point  Balloon angioplasty was performed on the jailed ostial 2nd Diag lesion using a BALLOON SAPPHIRE 2.0X12.  Post intervention, there is a 0% residual stenosis in the new LAD stent with appropriate overlap post elation.  Post intervention, there is a 40% residual stenosis in the 2nd Diag sidebranch.  Dist Cx lesion is 45% stenosed.  ------------------------------  RIGHT HEART CATH NUMBERS ALONG WITH CARDIAC OUTPUT AND INDEX ARE NORMAL. NO PULMONARY HYPERTENSION  A drug-eluting stent was successfully placed using a SYNERGY XD 2.50X32.  SUMMARY  Two-Vessel CAD:  With widely patent stents in the mid RCA and ostial and proximal LAD,  Likely culprit lesion was a mid LAD 75% focal eccentric lesion just after major 2nd Diag (borderline by RFR, positive by FFR) with moderate 40 to 50% stenosis in the ostium of the diagonal branch.  Successful FFR guided PCI of the mid LAD placing an overlapping Synergy DES 2.5 mm x 32 mm postdilated at the overlap segment to 3.7 mm  Large codominant LCx with minimal disease.  LCx courses of the large lateral OM after giving rise to a large AV groove branch that gives off a major left posterolateral branch.  Normal Right Heart Pressures with Normal Cardiac Output and Index.-No evidence of Pulmonary Hypertension RECOMMENDATIONS  We will continue to titrate his medications in the outpatient setting.  Has not been on beta-blocker because of fatigue. [name], MD  ECHOCARDIOGRAM COMPLETE  Result Date: 09/29/2020    ECHOCARDIOGRAM REPORT   Patient Name:   Kenneth [name] Date of Exam: 09/29/2020 Medical Rec #:  [id number]       Height:       75.0 in Accession #:    [id number]       Weight:       231.8 lb Date of Birth:  [birth year]       BSA:          2.336 m Patient Age:    61 years        BP:           161/82 mmHg Patient Gender: M               HR:           79 bpm. Exam Location:  [facility] Procedure: 2D Echo, Cardiac Doppler and Color Doppler Indications:    R06.00 (ICD-10-CM) - Exertional dyspnea  History:        Patient has prior history of Echocardiogram examinations, most                 recent 11/15/2013. CHF, CAD, Arrythmias:Atrial Fibrillation and  Atrial Flutter; Risk Factors:Hypertension, Diabetes and                 Dyslipidemia. S/P drug eluting coronary stent placement (From                 Hx), Obstructive sleep apnea, History of rheumatic fever (From                 Hx), GERD.  Sonographer:    Alvino Chapel RCS Referring Phys: [id number] [place]  1. Left ventricular ejection fraction, by estimation, is >75%. The left ventricle has hyperdynamic function. The left ventricle has no regional wall motion abnormalities. There is mild left ventricular hypertrophy. Left ventricular diastolic parameters are indeterminate.  2. Right ventricular systolic function is normal. The right ventricular size is normal. There is normal pulmonary artery systolic pressure.  3. Left atrial size was mildly dilated.  4. Mild mitral valve regurgitation.  5. The aortic valve is abnormal. Aortic valve regurgitation is mild.  6. The inferior vena cava is dilated in size with <50% respiratory variability, suggesting right atrial pressure of 15 mmHg. FINDINGS  Left Ventricle: Left ventricular ejection fraction, by estimation, is >75%. The left ventricle has hyperdynamic function. The left ventricle has no regional wall motion abnormalities. The left ventricular internal cavity size was normal in size. There is mild left ventricular hypertrophy. Left ventricular diastolic parameters are indeterminate. Right Ventricle: The right ventricular size is normal. No increase in right  ventricular wall thickness. Right ventricular systolic function is normal. There is normal pulmonary artery systolic pressure. The tricuspid regurgitant velocity is 2.44 m/s, and  with an assumed right atrial pressure of 8 mmHg, the estimated right ventricular systolic pressure is 50.5 mmHg. Left Atrium: Left atrial size was mildly dilated. Right Atrium: Right atrial size was normal in size. Pericardium: There is no evidence of pericardial effusion. Mitral Valve: Mild mitral valve regurgitation. Tricuspid Valve: The tricuspid valve is normal in structure. Tricuspid valve regurgitation is mild. Aortic Valve: AV is thickened, calcified Peak and mean gradients through the valve are 40 and 24 mm Hg respectively. [name] (VTI) 0.99 cm2 Dimensionless index is 0.31 consistent with moderate AS. The aortic valve is abnormal. Aortic valve regurgitation is mild. Aortic regurgitation PHT measures 680 msec. Aortic valve mean gradient measures 25.2 mmHg. Aortic valve peak gradient measures 43.9 mmHg. Aortic valve area, by VTI measures 0.96 cm. Pulmonic Valve: The pulmonic valve was not well visualized. Pulmonic valve regurgitation is not visualized. Aorta: The aortic root is normal in size and structure. Venous: The inferior vena cava is dilated in size with less than 50% respiratory variability, suggesting right atrial pressure of 15 mmHg. IAS/Shunts: No atrial level shunt detected by color flow Doppler.  LEFT VENTRICLE PLAX 2D LVIDd:         5.00 cm  Diastology LVIDs:         2.80 cm  LV e' medial:    7.94 cm/s LV PW:         1.40 cm  LV E/e' medial:  15.7 LV IVS:        1.10 cm  LV e' lateral:   7.51 cm/s LVOT diam:     2.00 cm  LV E/e' lateral: 16.6 LV SV:         73 LV SV Index:   31 LVOT Area:     3.14 cm  RIGHT VENTRICLE RV S prime:     13.30 cm/s TAPSE (M-mode): 1.8 cm LEFT  ATRIUM             Index       RIGHT ATRIUM           Index LA diam:        5.00 cm 2.14 cm/m  RA Area:     25.80 cm LA Vol (A2C):   78.9 ml 33.77  ml/m RA Volume:   81.30 ml  34.80 ml/m LA Vol (A4C):   81.9 ml 35.05 ml/m LA Biplane Vol: 86.0 ml 36.81 ml/m  AORTIC VALVE AV Area (Vmax):    0.88 cm AV Area (Vmean):   0.86 cm AV Area (VTI):     0.96 cm AV Vmax:           331.20 cm/s AV Vmean:          239.400 cm/s AV VTI:            0.759 m AV Peak Grad:      43.9 mmHg AV Mean Grad:      25.2 mmHg LVOT Vmax:         93.30 cm/s LVOT Vmean:        65.600 cm/s LVOT VTI:          0.233 m LVOT/AV VTI ratio: 0.31 AI PHT:            680 msec  AORTA Ao Root diam: 3.80 cm MITRAL VALVE                TRICUSPID VALVE MV Area (PHT): 2.46 cm     TR Peak grad:   23.8 mmHg MV Decel Time: 309 msec     TR Vmax:        244.00 cm/s MV E velocity: 125.00 cm/s MV A velocity: 71.60 cm/s   SHUNTS MV E/A ratio:  1.75         Systemic VTI:  0.23 m                             Systemic Diam: 2.00 cm Dorris Carnes MD Electronically signed by Dorris Carnes MD Signature Date/Time: 09/29/2020/4:44:08 PM    Final     Disposition   Pt is being discharged home today in good condition.  Follow-up Plans & Appointments     Follow-up Information     Leonie Man, MD Follow up on 11/02/2020.   Specialty: Cardiology Why: at 10:30am for your follow up appt Contact information: [address] [place] [address] [phone]                   Discharge Medications   Allergies as of 10/11/2020       Reactions   Hydrocodone Shortness Of Breath   In combination with decongestants   Other Palpitations, Other (See Comments)   ALL DECONGESTANTS - CAUSE PALPITATIONS; THROWS HEART RHYTHM OUT OF BALANCE   Oxycodone Shortness Of Breath   Pseudoephedrine Palpitations   Tramadol Shortness Of Breath, Rash   flushing   Gemfibrozil Other (See Comments)   Dizziness        Medication List     TAKE these medications    acetaminophen 500 MG tablet Commonly known as: TYLENOL Take 1,000 mg by mouth every 6 (six) hours as needed for mild pain or moderate  pain.   Adalimumab 40 MG/0.4ML Pskt Inject 40 mg into the skin every 14 (fourteen) days.   albuterol 108 (90 Base) MCG/ACT  inhaler Commonly known as: VENTOLIN HFA Inhale 2 puffs into the lungs every 6 (six) hours as needed for wheezing or shortness of breath.   aspirin EC 81 MG tablet Take 1 tablet (81 mg total) by mouth daily. Swallow whole. What changed: additional instructions   calcipotriene 0.005 % cream Commonly known as: DOVONOX Apply 1 application topically 2 (two) times daily as needed (psoriasis).   CALCIUM 1200 PO Take 1,200 mg by mouth daily.   cetirizine 10 MG tablet Commonly known as: ZYRTEC Take 10 mg by mouth daily.   clobetasol cream 0.05 % Commonly known as: TEMOVATE Apply 1 application topically 2 (two) times daily as needed (for psoriasis).   clopidogrel 75 MG tablet Commonly known as: Plavix Take 1 tablet (75 mg total) by mouth daily.   diltiazem 180 MG 24 hr capsule Commonly known as: CARDIZEM CD Take 1 capsule by mouth once daily What changed: when to take this   ferrous sulfate 325 (65 FE) MG tablet Take 325 mg by mouth daily.   gabapentin 300 MG capsule Commonly known as: NEURONTIN Take 300-600 mg by mouth See admin instructions. Take 300 mg in the morning and 600 mg in th evening   galantamine 8 MG 24 hr capsule Commonly known as: RAZADYNE ER Take 16 mg by mouth daily with breakfast.   HYDROcodone-acetaminophen 5-325 MG tablet Commonly known as: NORCO/VICODIN Take 1-2 tablets by mouth every 6 (six) hours as needed.   memantine 10 MG tablet Commonly known as: NAMENDA Take 20 mg by mouth at bedtime.   metFORMIN 500 MG 24 hr tablet Commonly known as: GLUCOPHAGE-XR Take 500 mg by mouth at bedtime.   predniSONE 5 MG tablet Commonly known as: DELTASONE Take 10 mg on M, W, F, and 5 mg on other days. What changed:  how much to take how to take this when to take this additional instructions   rosuvastatin 20 MG tablet Commonly  known as: CRESTOR Take 1 tablet (20 mg total) by mouth every evening. What changed: when to take this   SYSTANE OP Place 1 drop into both eyes daily as needed (Dry eye).   tamsulosin 0.4 MG Caps capsule Commonly known as: FLOMAX Take 0.4 mg by mouth daily.           Allergies Allergies  Allergen Reactions   Hydrocodone Shortness Of Breath    In combination with decongestants   Other Palpitations and Other (See Comments)    ALL DECONGESTANTS - CAUSE PALPITATIONS; THROWS HEART RHYTHM OUT OF BALANCE   Oxycodone Shortness Of Breath   Pseudoephedrine Palpitations   Tramadol Shortness Of Breath and Rash    flushing   Gemfibrozil Other (See Comments)    Dizziness      Outstanding Labs/Studies   N/a   Duration of Discharge Encounter   Greater than 30 minutes including physician time.  Signed, Reino Bellis, NP 10/11/2020, 1:52 PM

## 2020-10-11 NOTE — Discharge Instructions (Signed)

## 2020-10-11 NOTE — Progress Notes (Signed)
Oozing with tr band on; Vin ,PA notified and TR band removed and pressure held and no further oozing noted; site bruised

## 2020-10-11 NOTE — Interval H&P Note (Signed)
History and Physical Interval Note:  10/11/2020 10:19 AM  [name]  has presented today for surgery, with the diagnosis of angina & doe.  The various methods of treatment have been discussed with the patient and family. After consideration of risks, benefits and other options for treatment, the patient has consented to  Procedure(s): RIGHT/LEFT HEART CATH AND CORONARY ANGIOGRAPHY (N/A)  PERCUTANEOUS CORONARY INTERVENTION  as a surgical intervention.  The patient's history has been reviewed, patient examined, no change in status, stable for surgery.  I have reviewed the patient's chart and labs.  Questions were answered to the patient's satisfaction.    Cath Lab Visit (complete for each Cath Lab visit)  Clinical Evaluation Leading to the Procedure:   ACS: No.  Non-ACS:    Anginal Classification: CCS III  Anti-ischemic medical therapy: Maximal Therapy (2 or more classes of medications)  Non-Invasive Test Results: No non-invasive testing performed  Prior CABG: No previous CABG    [name]

## 2020-10-12 ENCOUNTER — Encounter (HOSPITAL_COMMUNITY): Payer: Self-pay | Admitting: Cardiology

## 2020-10-13 ENCOUNTER — Telehealth (HOSPITAL_COMMUNITY): Payer: Self-pay

## 2020-10-13 NOTE — Telephone Encounter (Signed)
Pt is not interested in the cardiac rehab program stated that he done the program before and it irritated him. Closed referral

## 2020-10-16 DIAGNOSIS — H04123 Dry eye syndrome of bilateral lacrimal glands: Secondary | ICD-10-CM | POA: Diagnosis not present

## 2020-10-16 DIAGNOSIS — H35373 Puckering of macula, bilateral: Secondary | ICD-10-CM | POA: Diagnosis not present

## 2020-10-18 ENCOUNTER — Telehealth (HOSPITAL_COMMUNITY): Payer: Self-pay | Admitting: Pharmacist

## 2020-10-18 ENCOUNTER — Other Ambulatory Visit (HOSPITAL_COMMUNITY): Payer: Self-pay

## 2020-10-18 NOTE — Telephone Encounter (Signed)
First attempt at calling patient, left VM

## 2020-10-19 ENCOUNTER — Other Ambulatory Visit (HOSPITAL_COMMUNITY): Payer: Self-pay

## 2020-10-19 ENCOUNTER — Telehealth (HOSPITAL_COMMUNITY): Payer: Self-pay | Admitting: Pharmacist

## 2020-10-19 ENCOUNTER — Telehealth: Payer: Self-pay | Admitting: Cardiology

## 2020-10-19 NOTE — Telephone Encounter (Signed)
Pharmacy Transitions of Care Follow-up Telephone Call   Date of discharge: 10/11/2020 Discharge Diagnosis: angioplasy CAD   How have you been since you were released from the hospital?  Tired but improving   Medication changes made at discharge:  - START: clopidogrel  - STOPPED: None  - CHANGED: EC ASA, ditiazem, prednisone, rosuvastatin   Medication changes verified by the patient?  Yes    Medication Accessibility:   Home Pharmacy: [facility]   Was the patient provided with refills on discharged medications? Yes   Have all prescriptions been transferred from [facility] to home pharmacy?  [facility]     Medication Review: CLOPIDOGREL (PLAVIX) Clopidogrel 75 mg once daily. - Educated patient on expected duration of therapy of       with clopidogrel.  Advised patient that aspirin will be continued  indefinitely. - Reviewed potential DDIs with patient - Advised patient of medications to avoid (NSAIDs, ASA) - Educated that Tylenol (acetaminophen) will be the preferred analgesic to prevent risk of bleeding - Emphasized importance of monitoring for signs and symptoms of bleeding (abnormal bruising, prolonged bleeding, nose bleeds, bleeding from gums, discolored urine, black tarry stools) - Advised patient to alert all providers of anticoagulation therapy prior to starting a new medication or having a procedure      Follow-up Appointments:   [facility] f/u appt confirmed? Yes Scheduled to see Elsworth Soho and Ellyn Hack   If their condition worsens, is the pt aware to call PCP or go to the Emergency Dept.?   Final Patient Assessment: Spoke with patient and wife.  Reviewed medications and possible side effects.  Had concern about wrist but had already spoken with cardiologist regarding the concern.

## 2020-10-19 NOTE — Telephone Encounter (Signed)
Pt had a Heart Cath and Stent on 10/11/20, pt is calling because his right wrist is bruised and painful since yesterday, pt wants to know if this is normal? Please advise pt further

## 2020-10-19 NOTE — Telephone Encounter (Signed)
Spoke to patient's wife.She stated husband noticed a small nodule the size of a nickel on right wrist at cath site.Stated it was painful last night.No pain this morning.A lot of bruising around site.Advised no lifting heavy objects.Advised to monitor cath site and call back if does not improve.Advised to keep follow up appointment with [place] 7/28 at 10:30 am.I will send message to [place].

## 2020-10-20 ENCOUNTER — Other Ambulatory Visit (HOSPITAL_COMMUNITY): Payer: Medicare Other

## 2020-10-23 ENCOUNTER — Ambulatory Visit: Payer: Medicare Other | Admitting: Pulmonary Disease

## 2020-11-01 ENCOUNTER — Encounter: Payer: Self-pay | Admitting: Cardiology

## 2020-11-01 NOTE — Progress Notes (Signed)
Primary Care Provider: [facility], [facility] Cardiologist: [name], MD Electrophysiologist: None  Clinic Note: Chief Complaint  Patient presents with   Hospitalization Follow-up    Post cath - PCI of LAD - notable improvement in DOE   ===================================  ASSESSMENT/PLAN   Problem List Items Addressed This Visit       Cardiology Problems   Coronary artery disease involving native coronary artery with angina pectoris ([place]) - Primary (Chronic)    He is anginal equivalent is exertional dyspnea.  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  Clearly the LAD lesion was causing problems.  Now doing better post PCI.  Plan: Not using beta-blocker (see above-reason being fatigue).  I have replaced with not hydrating calcium channel blocker (diltiazem XT).  Stable. On stable dose of statin (Crestor 20 mg daily. He had previously been off of aspirin because of bruising.  Now currently on DAPT, but would likely stop aspirin in next follow-up and simply continue Plavix alone. As of October 2022, would be okay to hold Plavix and aspirin 5 days preop for urgent procedures.- Otherwise would prefer to wait until February 2023.,  At which time, we will Plavix can be interrupted       Diastolic CHF, chronic (HCC) (Chronic)    Does not really have much in the way of heart failure.  Right heart cath numbers were totally normal.  LVEDP was normal.  He has not really requiring any diuretic.  Right heart cath did not demonstrate pulmonary hypertension as a result of his lung disease.Marland Kitchen       CAD S/P percutaneous coronary angioplasty (Chronic)    Now has 2 overlapping stents in the LAD.  Anticipate him being on some type of maintenance therapy.  We have previously stopped Plavix because of bruising and bleeding or bruising.   Plan will be DAPT  uninterrupted for minimum of 3 months (preferably 6 months) which would put him out through October 2022 (preferably February 2023). Anticipate stopping aspirin in the 6 to 34-monthpost PCI timeframe and continue monotherapy with Plavix to complete 1 year. After 1 year, I would prefer to keep him on Plavix as long as he is already, but would potentially switch back to aspirin.      Essential hypertension (Chronic)    Blood pressure borderline today.  Continue to monitor, may need to consider increasing diltiazem to 240 mg daily since his resting heart rate is 90 bpm..  Since he is having little edema, will hold off on that for now.  Using a calcium channel blocker as opposed to a beta-blocker because of fatigue with beta-blocker.  With normal EF, acceptable for antianginal benefit. Not on ARB as yet, however may consider adding low-dose losartan if pressures continue to be somewhat elevated.       Relevant Orders   EKG 12-Lead (Completed)     Other   ILD (interstitial lung disease) (HCC) (Chronic)    Has a stable CT scan findings.  PFTs are also pending.  There was a concern of dilated PA suggesting pulmonary pretension on the CT scan.  However right heart cath numbers did not show any evidence of pulmonary hypertension.       Exertional dyspnea - Class III Angina (Chronic)    Clearly, his exertional dyspnea was consistent with his angina.  Notably improved now post PCI. Echo showed hyperdynamic LV function, likely with diastolic dysfunction, but right heart cath numbers were relatively normal.  No evidence of pulmonary hypertension.       Hyperlipidemia associated with type 2 diabetes mellitus (HCC) (Chronic)    Fasting lipid panel from June 30th was pretty well controlled with an LDL of 78 on current dose of rosuvastatin.  His triglycerides are elevated which is a little bit concerning.  I talked about potentially adding fish oil, but if triglycerides remain elevated may want to  consider Vascepa.  I suspect the triglycerides are elevated related to his diabetes.  He is only on metformin.  Would have a low threshold for considering SGLT2 inhibitor versus GLP-1 agonist.       ===================================  HPI:    Kenneth [name] is a 75 y.o. male with a PMH notable for 2 V CAD, HFpEF, h/o Afib/Flutter Ablation, ILD, HTN/HLD, & Psoriatic Arthritis & Recurrent Nephrolithiasis who presents today for post-cath f/u.  CARDIAC HISTORY: 2006: Atrial Flutter - s/p Ablation 01/2014: Afib Ablation (off [place]) 06/2016: Sx of DOE -> R&LHC - mild Pulm HTN, Severe mRCA 95% (DES PCI), p-mLAD ~70% w/ FFR 0.75 (staged DES PCI). Co-dom LCx distal 60% & OM3 55%.  07/2016: Staged PCI LAD  Kenneth [name] was last seen on 10/03/2020 via telemedicine as a follow-up from a visit on June 8 with Coletta Memos where he was complaining of exertional dyspnea.  Noting significant exertional dyspnea with minimal exertion (which was similar to his original presenting anginal symptoms).  He never had chest pain.  Notes getting dyspneic simply walking around the house at a moderate pace. => Symptoms were concerning for progressive angina, with relatively normal echocardiogram, but elevated CVP I recommended Right & Left Heart Cath  Recent Hospitalizations:  Same day-Cath-PCI 10/11/2020  Reviewed  CV studies:    The following studies were reviewed today: (if available, images/films reviewed: From Epic Chart or Care Everywhere) Echo 09/29/2020:  EF > 75%, Hyperdynamic. No RWMA. Mild LVH. ~ DD with mild LA dilation. Mild AI. Normal RV size & fxn, ? Dilated IVC c/w CVP ~ 15 mHg.  R&LHC-PCI 10/11/2020: Patent DES in mRCA & p-mLAD. CULPRIT = m-d LAD 70% (just after D1 w/ Ost 60%) FFR 0.75 =>  DES PCI m-dLAD & PTCA ost D1 (overlapping prior stent distally & crossing D2) SYNERGY DES 2.5 x 32 -> tapered post-dilation 3.7 to 2.6 (reduced to 0% residual), Ost D2 post PTCA 40%.   RIGHT HEART CATH NUMBERS ALONG  WITH CARDIAC OUTPUT AND INDEX ARE NORMAL. NO PULMONARY HYPERTENSION Diagnostic     Intervention   Right Heart Pressures PAP-mean: 29/10 mmHg - 18 mmHg PCWP 13 mmHg  AoP 127/67 mmHg- MAP 89 mmH Ao sat 100%, PA sat 75%. Cardiac Output/Index by Fick: 7.68/3.34.  Right Atrium RAP mean 5 mmHg  Right Ventricle RVP-EDP 30/1 mmHg - 6 mmHg    Interval History:   Kenneth [name] returns today noting that definitely has had improvement of his exertional dyspnea.  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  This means he has to walk about a mile out to the worksite carrying equipment or pushing a wheelbarrow with least 2 trips out and 2 trips back along with working all day.  He can  work about 6-7 hours when he previously used to work about 10 hours when he was younger.  He comes home, drenched in sweat and is tired.  He gets worn out and will rest for several hours after he finishes, but denies any significant dyspnea like he had prior to his cath which was basically walking around the house.  He never really had much in the way of any chest pain or pressure, and certainly notes his breathing is better.  He has no PND orthopnea.  Trivial edema.  No symptoms of arrhythmias, but does note his heart rate going up a little bit some.  CV Review of Symptoms (Summary) Cardiovascular ROS: positive for - he notes exertional dyspnea when he "overdoes it "or after a long day of working.  Trivial end of day swelling. negative for - chest pain, irregular heartbeat, orthopnea, palpitations, paroxysmal nocturnal dyspnea, shortness of breath, or lightheadedness, dizziness or wooziness, syncope/near syncope or TIA/amaurosis fugax, claudication.  REVIEWED OF SYSTEMS   Review of Systems  Constitutional:  Negative for malaise/fatigue (Notably improved energy levels post  PCI.) and weight loss.  HENT:  Positive for congestion. Negative for nosebleeds.   Respiratory:  Positive for wheezing (Pretty much at his baseline.). Negative for cough and shortness of breath.   Cardiovascular:        Per HPI  Gastrointestinal:  Negative for blood in stool and melena.  Genitourinary:  Negative for hematuria.  Musculoskeletal:  Positive for joint pain (Just mild aches and pains). Negative for myalgias.  Neurological:  Positive for dizziness (Occasionally during the heat if he is not drinking enough.). Negative for focal weakness and weakness.  Psychiatric/Behavioral:  Positive for memory loss (Mild). Negative for depression (This seems to be much better since his wife's health is improved.). The patient is nervous/anxious. The patient does not have insomnia.    I have reviewed and (if needed) personally updated the patient's problem list, medications, allergies, past medical and surgical history, social and family history.   PAST MEDICAL HISTORY   Past Medical History:  Diagnosis Date   Arthritis    CAD S/P percutaneous coronary angioplasty 06/28/2016   Cardiologist- Dr. Ellyn Hack: 06/28/16 -- [facility] PCI (Synergy DES 2.75 x 20 mm); 07/10/16: Staged p-m LAD PCI (FFR 0.75) - DES PCI (Synergy DES 3.5 x 20 mm); 10/11/2020: mid-distal LAD 70% (FFR 0.75) DES PCI (Synergy DES 2.5 mm x 32 mm - overlaps prox-mid stent, tapered post-dilation 3.7 - 2.6 mm.   Chronic diastolic heart failure (HCC)    (Cardiologist: Dr. Ellyn Hack): EF > 75% / Hyperdynamic LV Fxn - indeterminate Diastolic Fxn.   Chronic dryness of both eyes    Diverticulosis of colon    Epiretinal membrane    "epiretinal attachment" (06/28/2016) 10-14-2017 per pt oringinally in both , now only unilateral    GERD (gastroesophageal reflux disease)    Hiatal hernia    History of adenomatous polyp of colon    History of concussion 08/23/2017   w/o loc--- per pt no residual   History of kidney stones    History of rheumatic fever  1958   NO Evidence of Vavlular disease -> only mild Aortic Sclerosis   History of skin cancer    excision leg;  froze the left arm--- unsure BCC or SCC    Hyperlipidemia    Hypertension    ILD (interstitial lung disease) ([place])    pulmologist-  dr Elsworth Soho--  secondary to methotrexate use --- last PFTs 04-25-2015  mild restriction  Iron deficiency anemia    Mild obstructive sleep apnea    10-04-2019 per pt  had a sleep study years ago , was told no cpap recommended pt denies    Nephrolithiasis    CT 09-07-2019 bilateral nonobstructive stones   Paroxysmal atrial fibrillation [facility]) cardiologist-  dr Brixon Zhen/  EP-- dr Rayann Heman   a. 11/2003 Tikosyn initiated - subsequently d/c'd;  b. 2006 [place] for Afib @ [facility];  c. 09/2011 Echo: EF 60-65%, Gr 2 DD;  d. DCCV 10/2011 , 04/2012, 12/ 2014, & 02/ 2015;  e. RFCA 01-13-2014   Plaque psoriasis 1969   followed by rheumatologist  and dermatologist @ [facility] in [place]   Right ureteral stone    S/P drug eluting coronary stent placement    06-28-2016  x1 to mRCA;  07-10-2016 x1 to mLAD   Seasonal allergies    Type 2 diabetes mellitus ([place])    followed by pcp   Ureteral calculus, right    Wears hearing aid in both ears     PAST SURGICAL HISTORY   Past Surgical History:  Procedure Laterality Date   [place] N/A 01/13/2014   repeat PVI, also left atrial ablation performed with successfull ablation of LA flutter by Dr Rayann Heman   ATRIAL FIBRILLATION ABLATION  08/2004   Dr Rolland Porter at [facility] [place])   [place] N/A 05/22/2013   Procedure: CARDIOVERSION;  Surgeon: [name], MD;  Location: [facility];  Service: Cardiovascular;  Laterality: N/A;   CARDIOVERSION N/A 10/23/2011   Procedure: CARDIOVERSION;  Surgeon: Deboraha Sprang, MD;  Location: [facility] CATH LAB;  Service: Cardiovascular;  Laterality: N/A;   CARDIOVERSION N/A 05/01/2012   Procedure: CARDIOVERSION;  Surgeon: Deboraha Sprang, MD;  Location: [facility] CATH LAB;   Service: Cardiovascular;  Laterality: N/A;   CARPAL TUNNEL RELEASE Right 1980s   CATARACT EXTRACTION W/ INTRAOCULAR LENS  IMPLANT, BILATERAL  2012  approx.   CORONARY STENT INTERVENTION N/A 06/28/2016   Procedure: Coronary Stent Intervention;  Surgeon: Leonie Man, MD;  Location: [place] CV LAB;  Service: Cardiovascular;  Laterality: mRCA PCI -Synergy DES 2.75 m x 20 mm (3.1 mm)   CORONARY STENT INTERVENTION N/A 07/10/2016   Procedure: Coronary Stent Intervention;  Surgeon: Leonie Man, MD;  Location: [place] CV LAB;  Service: Cardiovascular: mLAD PCI Synergy DES 3.5 mm x 20 mm)   CORONARY STENT INTERVENTION N/A 10/11/2020   Procedure: CORONARY STENT INTERVENTION;  Surgeon: Leonie Man, MD;  Location: [place] CV LAB;  Service: Cardiovascular: mid-distal LAD ~70% (FFR 0.75): SYNERGY DES 2.5 MM X 32 MM (overlaps prox-mid Stent from 07/2016) - Tapered post-dilation 3.7 mm ->2.6 mm; Jailed D2 PTCA reduced 60% to 40%.   CYSTOSCOPY WITH RETROGRADE PYELOGRAM, URETEROSCOPY AND STENT PLACEMENT Right 10/16/2017   Procedure: CYSTOSCOPY WITH RIGHT RETROGRADE RIGHT URETEROSCOPY AND STONE BASKET EXTRACTION;  Surgeon: Irine Seal, MD;  Location: [facility];  Service: Urology;  Laterality: Right;   CYSTOSCOPY WITH RETROGRADE PYELOGRAM, URETEROSCOPY AND STENT PLACEMENT Right 10/08/2019   Procedure: CYSTOSCOPY WITH URETEROSCOPY; RETROGRADE PYELOGRAM; STONE BASKET EXTRACTION;  STENT PLACEMENT;  Surgeon: Cleon Gustin, MD;  Location[facility];  Service: Urology;  Laterality: Right;  1 HR   CYSTOSCOPY WITH RETROGRADE PYELOGRAM, URETEROSCOPY AND STENT PLACEMENT Right 03/28/2020   Procedure: CYSTOSCOPY WITH RIGHT RETROGRADE PYELOGRAM, URETEROSCOPY WITH HOLMIUM LASER AND STENT PLACEMENT;  Surgeon: Irine Seal, MD;  Location: [facility];  Service: Urology;  Laterality: Right;   CYSTOSCOPY/RETROGRADE/URETEROSCOPY/STONE EXTRACTION WITH BASKET Right 10/27/2017    Procedure: CYSTOSCOPY/RETROGRADE/URETEROSCOPY/STONE EXTRACTION WITH BASKET/URETERAL STENT PLACEMENT and laser;  Surgeon: Irine Seal, MD;  Location: [facility];  Service: Urology;  Laterality: Right;   EXTRACORPOREAL SHOCK WAVE LITHOTRIPSY Left 08/10/2020   Procedure: EXTRACORPOREAL SHOCK WAVE LITHOTRIPSY (ESWL);  Surgeon: Ardis Hughs, MD;  Location: [facility];  Service: Urology;  Laterality: Left;   HOLMIUM LASER APPLICATION Right [id number]   Procedure: POSSIBLE HOLMIUM LASER APPLICATION;  Surgeon: Irine Seal, MD;  Location: [facility];  Service: Urology;  Laterality: Right;   HOLMIUM LASER APPLICATION Right [id number]   Procedure: HOLMIUM LASER APPLICATION;  Surgeon: Cleon Gustin, MD;  Location: [facility];  Service: Urology;  Laterality: Right;   INTRAVASCULAR PRESSURE WIRE/FFR STUDY N/A 06/28/2016   Procedure: Intravascular Pressure Wire/FFR Study;  Surgeon: Leonie Man, MD;  Location: [place] CV LAB;  Service: Cardiovascular: FFR of mLAD ~65% lesion = 0.75 post --> Staged PCI   INTRAVASCULAR PRESSURE WIRE/FFR STUDY N/A 10/11/2020   Procedure: INTRAVASCULAR PRESSURE WIRE/FFR STUDY;  Surgeon: Leonie Man, MD;  Location: [place] CV LAB;  Service: Cardiovascular;  Laterality: LAD mid-distal just after D2 --> FFR 0.75 SIGNIFICANT => PCI   LEFT HEART CATH AND CORONARY ANGIOGRAPHY  06/30/2003   Dr. Lia Foyer: NO Significant/Obstructive CAD; Preseved LVEF (for Recurrent Afib)   NASAL SINUS SURGERY     x 2   PROXIMAL INTERPHALANGEAL FUSION (PIP) Left 01-05-2001    dr graves   correction clawtoe and extensor tendon lengthening   RIGHT/LEFT HEART CATH AND CORONARY ANGIOGRAPHY N/A 06/28/2016   Procedure: Right/Left Heart Cath and Coronary Angiography;  Surgeon: Leonie Man, MD;  Location: [place] CV LAB;  Service: Cardiovascular: mRCA 99% (TIMI 2), mLAD ~65% (FFR 0.75), OM3 55%. mild Pulm HTN. Mod LVEDP elevation. EF 55-65%.   RIGHT/LEFT  HEART CATH AND CORONARY ANGIOGRAPHY N/A 10/11/2020   Procedure: RIGHT/LEFT HEART CATH AND CORONARY ANGIOGRAPHY;  Surgeon: Leonie Man, MD;  Location: [place] CV LAB;; Patent DES in [place] & p-mLAD. CULPRIT = m-d LAD 70% (just after D1 w/ Ost 60%) FFR 0.75 => DES PCI m-dLAD & PTCA ost D1 (overlapping prior stent distally & crossing D2 w/  Ost D2 post PTCA). RIGHT HEART CATH PRESSURES,  & CO-CI ARE NORMAL. NO PULM HTN   ROTATOR CUFF REPAIR Bilateral last one 2014   TONSILLECTOMY  age 80   TRANSTHORACIC ECHOCARDIOGRAM  09/29/2020   EF > 75%, Hyperdynamic. No RWMA. Mild LVH. ~ DD with mild LA dilation. Mild AI. Normal RV size & fxn, ? Dilated IVC c/w CVP ~ 15 mHg.   URETEROSCOPIC LASER LITHOTRIPSY STONE EXTRACTIONS/  STENT PLACEMENT Bilateral 09-10-2007   dr Jeffie Pollock  [facility]   per pt has had several prior ureteroscopic stone extraction since age 56 , last one 09-10-2007    Immunization History  Administered Date(s) Administered   Fluad Quad(high Dose 65+) 11/30/2018, 02/01/2020   H1N1 03/18/2008   Influenza Split 03/27/2011, 01/17/2017   Influenza Whole 02/24/2008, 01/12/2009   Influenza, High Dose Seasonal PF 01/15/2016, 12/30/2016, 01/26/2018   Influenza,inj,Quad PF,6+ Mos 01/03/2014   Influenza-Unspecified 01/23/2015   Moderna Sars-Covid-2 Vaccination 06/03/2019, 07/01/2019, 12/09/2019, 07/18/2020   Pneumococcal Polysaccharide-23 11/30/2018, 02/28/2020   Pneumococcal-Unspecified 01/15/2016   Tdap 02/01/2020    MEDICATIONS/ALLERGIES   No outpatient medications have been marked as taking for the 11/02/20 encounter (Office Visit) with Leonie Man, MD.    Allergies  Allergen  Reactions   Hydrocodone Shortness Of Breath    In combination with decongestants   Other Palpitations and Other (See Comments)    ALL DECONGESTANTS - CAUSE PALPITATIONS; THROWS HEART RHYTHM OUT OF BALANCE   Oxycodone Shortness Of Breath   Pseudoephedrine Palpitations   Tramadol Shortness Of Breath and Rash     flushing   Gemfibrozil Other (See Comments)    Dizziness      SOCIAL HISTORY/FAMILY HISTORY   Reviewed in Epic:  Pertinent findings:  Social History   Tobacco Use   Smoking status: Former    Types: Cigars    Quit date: 09/29/1997    Years since quitting: 23.1   Smokeless tobacco: Former    Types: Snuff, Chew   Tobacco comments:    4-5 per day at his max  Vaping Use   Vaping Use: Never used  Substance Use Topics   Alcohol use: No   Drug use: No   Social History   Social History Narrative   Pt lives in [place] with spouse.     Retired.    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       After retiring from [employer].        Had been very active as an Hotel manager"  - now doing his best to "stay out of this mess of politics" -- has declined efforts to get him to run for office.      Now is primary caregiver for his wife Judeen Hammans) - who has metastatic CA & has had CVA x 2.  Pretty much "total care".  Very emotionally taxing.      He has plans to build homes for all of his children & grandchildren on their property - to ensure they all have a place to live - Not transferable    OBJCTIVE -PE, EKG, labs   Wt Readings from Last 3 Encounters:  11/02/20 222 lb 9.6 oz (101 kg)  10/11/20 222 lb (100.7 kg)  10/03/20 222 lb (100.7 kg)    Physical Exam: BP 130/68   Pulse 90   Ht '6\' 3"'$  (1.905 m)   Wt 222 lb 9.6 oz (101 kg)   SpO2 98%   BMI 27.82 kg/m  Physical Exam Vitals reviewed.  Constitutional:      General: He is not in acute distress.    Appearance: Normal appearance. He is normal weight. He is not ill-appearing or toxic-appearing.     Comments: Well-groomed.  Robust gentleman who actually seems younger than her stated age.  HENT:     Head: Atraumatic.  Neck:     Vascular: No carotid bruit.   Cardiovascular:     Rate and Rhythm: Normal rate and regular rhythm.     Pulses: Normal pulses.     Heart sounds: Normal heart sounds. No murmur heard.   No friction rub. No gallop.     Comments: Nondisplaced PMI Pulmonary:     Effort: Pulmonary effort is normal. No respiratory distress.     Comments: Mild interstitial sounds and basal crackles but no wheezes rales or rhonchi. Chest:     Chest wall: No tenderness.  Abdominal:     General: Abdomen is flat. Bowel sounds are normal. There is no distension.     Palpations: There is no mass (No HSM or bruit).     Tenderness: There  is no abdominal tenderness.  Musculoskeletal:        General: Swelling (Trivial ankle) present. Normal range of motion.     Cervical back: Normal range of motion and neck supple.  Skin:    General: Skin is warm and dry.  Neurological:     General: No focal deficit present.     Mental Status: He is alert and oriented to person, place, and time. Mental status is at baseline.  Psychiatric:        Mood and Affect: Mood normal.        Behavior: Behavior normal.        Thought Content: Thought content normal.        Judgment: Judgment normal.     Comments: Seems to be in a better mood today.  More talkative.    Adult ECG Report  Rate: 90 ;  Rhythm: normal sinus rhythm, sinus arrhythmia, and otherwise normal axis, intervals and durations. ;   Narrative Interpretation: Stable  Recent Labs: Reviewed Lab Results  Component Value Date   CHOL 165 10/05/2020   HDL 53 10/05/2020   LDLCALC 70 10/05/2020   TRIG 259 (H) 10/05/2020   CHOLHDL 3.1 10/05/2020   Lab Results  Component Value Date   CREATININE 0.96 10/05/2020   BUN 12 10/05/2020   NA 143 10/11/2020   NA 142 10/11/2020   K 3.6 10/11/2020   K 3.8 10/11/2020   CL 99 10/05/2020   CO2 23 10/05/2020   CBC Latest Ref Rng & Units 10/11/2020 10/11/2020 10/11/2020  WBC 3.4 - 10.8 x10E3/uL - - -  Hemoglobin 13.0 - 17.0 g/dL 11.9(L) 11.6(L) 12.2(L)  Hematocrit  39.0 - 52.0 % 35.0(L) 34.0(L) 36.0(L)  Platelets 150 - 450 x10E3/uL - - -    Lab Results  Component Value Date   TSH 1.018 06/25/2016    ==================================================  COVID-19 Education: The signs and symptoms of COVID-19 were discussed with the patient and how to seek care for testing (follow up with PCP or arrange E-visit).    I spent a total of 63mnutes with the patient spent in direct patient consultation.  Additional time spent with chart review  / charting (studies, outside notes, etc): 8  min  Total Time: 46 min  Current medicines are reviewed at length with the patient today.  (+/- concerns) n/a  This visit occurred during the SARS-CoV-2 public health emergency.  Safety protocols were in place, including screening questions prior to the visit, additional usage of staff PPE, and extensive cleaning of exam room while observing appropriate contact time as indicated for disinfecting solutions.  Notice: This dictation was prepared with Dragon dictation along with smaller phrase technology. Any transcriptional errors that result from this process are unintentional and may not be corrected upon review.  Patient Instructions / Medication Changes & Studies & Tests Ordered   Patient Instructions  Medication Instructions:  No changes  *If you need a refill on your cardiac medications before your next appointment, please call your pharmacy*   Lab Work: Not needed    Testing/Procedures: Not needed   Follow-Up: At [facility] you and your health needs are our priority.  As part of our continuing mission to provide you with exceptional heart care, we have created designated Provider Care Teams.  These Care Teams include your primary Cardiologist (physician) and Advanced Practice Providers (APPs -  Physician Assistants and Nurse Practitioners) who all work together to provide you with the care you need, when you need it.  We recommend signing up for the  patient portal called "MyChart".  Sign up information is provided on this After Visit Summary.  MyChart is used to connect with patients for Virtual Visits (Telemedicine).  Patients are able to view lab/test results, encounter notes, upcoming appointments, etc.  Non-urgent messages can be sent to your provider as well.   To learn more about what you can do with MyChart, go to [email or web address].    Your next appointment:   8 month(s) March 2023  The format for your next appointment:   In Person  Provider:   [name], MD      Studies Ordered:   Orders Placed This Encounter  Procedures   EKG 12-Lead      [name], M.D., M.S. Interventional Cardiologist   Pager # [phone] Phone # [phone] [address] [place], [place] [address]   Thank you for choosing [facility] [facility]

## 2020-11-02 ENCOUNTER — Other Ambulatory Visit: Payer: Self-pay

## 2020-11-02 ENCOUNTER — Encounter: Payer: Self-pay | Admitting: Cardiology

## 2020-11-02 ENCOUNTER — Ambulatory Visit (INDEPENDENT_AMBULATORY_CARE_PROVIDER_SITE_OTHER): Payer: Medicare Other | Admitting: Cardiology

## 2020-11-02 VITALS — BP 130/68 | HR 90 | Ht 75.0 in | Wt 222.6 lb

## 2020-11-02 DIAGNOSIS — Z9861 Coronary angioplasty status: Secondary | ICD-10-CM | POA: Diagnosis not present

## 2020-11-02 DIAGNOSIS — I25119 Atherosclerotic heart disease of native coronary artery with unspecified angina pectoris: Secondary | ICD-10-CM | POA: Diagnosis not present

## 2020-11-02 DIAGNOSIS — I10 Essential (primary) hypertension: Secondary | ICD-10-CM | POA: Diagnosis not present

## 2020-11-02 DIAGNOSIS — I5032 Chronic diastolic (congestive) heart failure: Secondary | ICD-10-CM

## 2020-11-02 DIAGNOSIS — E1169 Type 2 diabetes mellitus with other specified complication: Secondary | ICD-10-CM | POA: Diagnosis not present

## 2020-11-02 DIAGNOSIS — R0609 Other forms of dyspnea: Secondary | ICD-10-CM

## 2020-11-02 DIAGNOSIS — E785 Hyperlipidemia, unspecified: Secondary | ICD-10-CM | POA: Diagnosis not present

## 2020-11-02 DIAGNOSIS — I2510 Atherosclerotic heart disease of native coronary artery without angina pectoris: Secondary | ICD-10-CM | POA: Diagnosis not present

## 2020-11-02 DIAGNOSIS — R0600 Dyspnea, unspecified: Secondary | ICD-10-CM | POA: Diagnosis not present

## 2020-11-02 DIAGNOSIS — J849 Interstitial pulmonary disease, unspecified: Secondary | ICD-10-CM | POA: Diagnosis not present

## 2020-11-02 NOTE — Patient Instructions (Addendum)
Medication Instructions:  No changes  *If you need a refill on your cardiac medications before your next appointment, please call your pharmacy*   Lab Work: Not needed    Testing/Procedures: Not needed   Follow-Up: At [facility], you and your health needs are our priority.  As part of our continuing mission to provide you with exceptional heart care, we have created designated Provider Care Teams.  These Care Teams include your primary Cardiologist (physician) and Advanced Practice Providers (APPs -  Physician Assistants and Nurse Practitioners) who all work together to provide you with the care you need, when you need it.  We recommend signing up for the patient portal called "MyChart".  Sign up information is provided on this After Visit Summary.  MyChart is used to connect with patients for Virtual Visits (Telemedicine).  Patients are able to view lab/test results, encounter notes, upcoming appointments, etc.  Non-urgent messages can be sent to your provider as well.   To learn more about what you can do with MyChart, go to [email or web address].    Your next appointment:   8 month(s) March 2023  The format for your next appointment:   In Person  Provider:   [name], MD

## 2020-11-11 ENCOUNTER — Encounter: Payer: Self-pay | Admitting: Cardiology

## 2020-11-11 NOTE — Assessment & Plan Note (Signed)
Blood pressure borderline today.  Continue to monitor, may need to consider increasing diltiazem to 240 mg daily since his resting heart rate is 90 bpm..  Since he is having little edema, will hold off on that for now.  Using a calcium channel blocker as opposed to a beta-blocker because of fatigue with beta-blocker.  With normal EF, acceptable for antianginal benefit. Not on ARB as yet, however may consider adding low-dose losartan if pressures continue to be somewhat elevated.

## 2020-11-11 NOTE — Assessment & Plan Note (Signed)
Has a stable CT scan findings.  PFTs are also pending.  There was a concern of dilated PA suggesting pulmonary pretension on the CT scan.  However right heart cath numbers did not show any evidence of pulmonary hypertension.

## 2020-11-11 NOTE — Assessment & Plan Note (Signed)
He is anginal equivalent is exertional dyspnea.  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  Clearly the LAD lesion was causing problems.  Now doing better post PCI.  Plan:  Not using beta-blocker (see above-reason being fatigue).  I have replaced with not hydrating calcium channel blocker (diltiazem XT).  Stable.  On stable dose of statin (Crestor 20 mg daily.  He had previously been off of aspirin because of bruising.  Now currently on DAPT, but would likely stop aspirin in next follow-up and simply continue Plavix alone.  As of October 2022, would be okay to hold Plavix and aspirin 5 days preop for urgent procedures.-  Otherwise would prefer to wait until February 2023.,  At which time, we will Plavix can be interrupted

## 2020-11-11 NOTE — Assessment & Plan Note (Signed)
Fasting lipid panel from June 30th was pretty well controlled with an LDL of 78 on current dose of rosuvastatin.  His triglycerides are elevated which is a little bit concerning.  I talked about potentially adding fish oil, but if triglycerides remain elevated may want to consider Vascepa.  I suspect the triglycerides are elevated related to his diabetes.  He is only on metformin.  Would have a low threshold for considering SGLT2 inhibitor versus GLP-1 agonist.

## 2020-11-11 NOTE — Assessment & Plan Note (Addendum)
Does not really have much in the way of heart failure.  Right heart cath numbers were totally normal.  LVEDP was normal.  He has not really requiring any diuretic.  Right heart cath did not demonstrate pulmonary hypertension as a result of his lung disease.Marland Kitchen

## 2020-11-11 NOTE — Assessment & Plan Note (Signed)
Clearly, his exertional dyspnea was consistent with his angina.  Notably improved now post PCI. Echo showed hyperdynamic LV function, likely with diastolic dysfunction, but right heart cath numbers were relatively normal.  No evidence of pulmonary hypertension.

## 2020-11-11 NOTE — Assessment & Plan Note (Signed)
Now has 2 overlapping stents in the LAD.  Anticipate Kenneth [name] being on some type of maintenance therapy.  We have previously stopped Plavix because of bruising and bleeding or bruising.   Plan will be DAPT uninterrupted for minimum of 3 months (preferably 6 months) which would put Kenneth [name] out through October 2022 (preferably February 2023).  Anticipate stopping aspirin in the 6 to 77-monthpost PCI timeframe and continue monotherapy with Plavix to complete 1 year.  After 1 year, I would prefer to keep Kenneth [name] on Plavix as long as he is already, but would potentially switch back to aspirin.

## 2020-12-18 ENCOUNTER — Encounter: Payer: Self-pay | Admitting: Emergency Medicine

## 2020-12-18 ENCOUNTER — Other Ambulatory Visit: Payer: Self-pay

## 2020-12-18 ENCOUNTER — Ambulatory Visit
Admission: EM | Admit: 2020-12-18 | Discharge: 2020-12-18 | Disposition: A | Discharge status: Home / Self Care | Payer: Medicare Other | Attending: Emergency Medicine | Admitting: Emergency Medicine

## 2020-12-18 DIAGNOSIS — S61209A Unspecified open wound of unspecified finger without damage to nail, initial encounter: Secondary | ICD-10-CM

## 2020-12-18 DIAGNOSIS — S61219A Laceration without foreign body of unspecified finger without damage to nail, initial encounter: Secondary | ICD-10-CM

## 2020-12-18 MED ORDER — CEPHALEXIN 500 MG PO CAPS
500.0000 mg | ORAL_CAPSULE | Freq: Four times a day (QID) | ORAL | 0 refills | Status: DC
Start: 2020-12-18 — End: 2021-01-08

## 2020-12-18 NOTE — ED Triage Notes (Signed)
Pt is present today with a finger laceration on the right index finger. Pt states that he accidentally cut his finger this morning near the nail.

## 2020-12-18 NOTE — ED Provider Notes (Signed)
[facility]    CSN: [id number] Arrival date & time: 12/18/20  1512      History   Chief Complaint Chief Complaint  Patient presents with   Laceration    HPI Kenneth [name] is a 75 y.o. male.   Pt rt ring finger was caught in the door at [employer] today. Pt is on blood thinners and was bleeding heavy. Pt has full movement of finger. Tetanus shot in 2021 wrapped with gauze and compression pta .    Past Medical History:  Diagnosis Date   Arthritis    CAD S/P percutaneous coronary angioplasty 06/28/2016   Cardiologist- Dr. Ellyn Hack: 06/28/16 -- [facility] PCI (Synergy DES 2.75 x 20 mm); 07/10/16: Staged p-m LAD PCI (FFR 0.75) - DES PCI (Synergy DES 3.5 x 20 mm); 10/11/2020: mid-distal LAD 70% (FFR 0.75) DES PCI (Synergy DES 2.5 mm x 32 mm - overlaps prox-mid stent, tapered post-dilation 3.7 - 2.6 mm.   Chronic diastolic heart failure (HCC)    (Cardiologist: Dr. Ellyn Hack): EF > 75% / Hyperdynamic LV Fxn - indeterminate Diastolic Fxn.   Chronic dryness of both eyes    Diverticulosis of colon    Epiretinal membrane    "epiretinal attachment" (06/28/2016) 10-14-2017 per pt oringinally in both , now only unilateral    GERD (gastroesophageal reflux disease)    Hiatal hernia    History of adenomatous polyp of colon    History of concussion 08/23/2017   w/o loc--- per pt no residual   History of kidney stones    History of rheumatic fever 1958   NO Evidence of Vavlular disease -> only mild Aortic Sclerosis   History of skin cancer    excision leg;  froze the left arm--- unsure BCC or SCC    Hyperlipidemia    Hypertension    ILD (interstitial lung disease) ([place])    pulmologist-  dr Elsworth Soho--  secondary to methotrexate use --- last PFTs 04-25-2015  mild restriction   Iron deficiency anemia    Mild obstructive sleep apnea    10-04-2019 per pt  had a sleep study years ago , was told no cpap recommended pt denies    Nephrolithiasis    CT 09-07-2019 bilateral nonobstructive stones    Paroxysmal atrial fibrillation [facility]) cardiologist-  dr harding/  EP-- dr Rayann Heman   a. 11/2003 Tikosyn initiated - subsequently d/c'd;  b. 2006 RFCA for Afib @ [place];  c. 09/2011 Echo: EF 60-65%, Gr 2 DD;  d. DCCV 10/2011 , 04/2012, 12/ 2014, & 02/ 2015;  e. RFCA 01-13-2014   Plaque psoriasis 1969   followed by rheumatologist  and dermatologist @ [facility] in [place]   Right ureteral stone    S/P drug eluting coronary stent placement    06-28-2016  x1 to [place];  07-10-2016 x1 to mLAD   Seasonal allergies    Type 2 diabetes mellitus ([place])    followed by pcp   Ureteral calculus, right    Wears hearing aid in both ears     Patient Active Problem List   Diagnosis Date Noted   Overweight (BMI 25.0-29.9) 12/29/2018   Atrial tachycardia ([place]) 02/12/2018   Right ureteral stone 10/27/2017   Controlled type 2 diabetes mellitus without complication, without long-term current use of insulin ([place]) 05/12/2017   Essential hypertension 05/12/2017   Iron deficiency anemia 11/11/2016   Hyperlipidemia associated with type 2 diabetes mellitus ([place]) 11/11/2016   CAD S/P percutaneous coronary angioplasty 06/28/2016   Coronary artery disease involving  native coronary artery with angina pectoris ([place]) 06/27/2016   Exertional dyspnea - Class III Angina 06/27/2016   Bronchitis 07/03/2015   ILD (interstitial lung disease) ([place]) 01/27/2014   Atypical atrial flutter ([place]) 01/13/2014   Hx of colonic polyp [id number]   Diastolic CHF, chronic (HCC) 05/22/2013   OTHER CHRONIC SINUSITIS 05/31/2010   NEPHROLITHIASIS, HX OF 02/02/2008   PERSISTENT DISORDER INITIATING/MAINTAINING SLEEP 12/16/2007   Obstructive sleep apnea 10/07/2007   UNSPEC ALVEOLAR&PARIETOALVEOLAR PNEUMONOPATHY 10/02/2007   Allergic rhinitis 07/24/2007   Postoperative atrial fibrillation ([place]) 07/06/2007    Past Surgical History:  Procedure Laterality Date   APPENDECTOMY  1996   ATRIAL FIBRILLATION ABLATION N/A 01/13/2014   repeat PVI, also left  atrial ablation performed with successfull ablation of LA flutter by Dr Rayann Heman   ATRIAL FIBRILLATION ABLATION  08/2004   Dr Rolland Porter at [facility] [place])   [place] N/A 05/22/2013   Procedure: CARDIOVERSION;  Surgeon: [name], MD;  Location: [place];  Service: Cardiovascular;  Laterality: N/A;   CARDIOVERSION N/A 10/23/2011   Procedure: CARDIOVERSION;  Surgeon: Deboraha Sprang, MD;  Location[facility] CATH LAB;  Service: Cardiovascular;  Laterality: N/A;   CARDIOVERSION N/A 05/01/2012   Procedure: CARDIOVERSION;  Surgeon: Deboraha Sprang, MD;  Location: [facility] CATH LAB;  Service: Cardiovascular;  Laterality: N/A;   CARPAL TUNNEL RELEASE Right 1980s   CATARACT EXTRACTION W/ INTRAOCULAR LENS  IMPLANT, BILATERAL  2012  approx.   CORONARY STENT INTERVENTION N/A 06/28/2016   Procedure: Coronary Stent Intervention;  Surgeon: Leonie Man, MD;  Location: [place] CV LAB;  Service: Cardiovascular;  Laterality: mRCA PCI -Synergy DES 2.75 m x 20 mm (3.1 mm)   CORONARY STENT INTERVENTION N/A 07/10/2016   Procedure: Coronary Stent Intervention;  Surgeon: Leonie Man, MD;  Location: [place] CV LAB;  Service: Cardiovascular: mLAD PCI Synergy DES 3.5 mm x 20 mm)   CORONARY STENT INTERVENTION N/A 10/11/2020   Procedure: CORONARY STENT INTERVENTION;  Surgeon: Leonie Man, MD;  Location: [place] CV LAB;  Service: Cardiovascular: mid-distal LAD ~70% (FFR 0.75): SYNERGY DES 2.5 MM X 32 MM (overlaps prox-mid Stent from 07/2016) - Tapered post-dilation 3.7 mm ->2.6 mm; Jailed D2 PTCA reduced 60% to 40%.   CYSTOSCOPY WITH RETROGRADE PYELOGRAM, URETEROSCOPY AND STENT PLACEMENT Right 10/16/2017   Procedure: CYSTOSCOPY WITH RIGHT RETROGRADE RIGHT URETEROSCOPY AND STONE BASKET EXTRACTION;  Surgeon: Irine Seal, MD;  Location: [facility];  Service: Urology;  Laterality: Right;   CYSTOSCOPY WITH RETROGRADE PYELOGRAM, URETEROSCOPY AND STENT PLACEMENT Right 10/08/2019   Procedure:  CYSTOSCOPY WITH URETEROSCOPY; RETROGRADE PYELOGRAM; STONE BASKET EXTRACTION;  STENT PLACEMENT;  Surgeon: Cleon Gustin, MD;  Location: [facility];  Service: Urology;  Laterality: Right;  1 HR   CYSTOSCOPY WITH RETROGRADE PYELOGRAM, URETEROSCOPY AND STENT PLACEMENT Right 03/28/2020   Procedure: CYSTOSCOPY WITH RIGHT RETROGRADE PYELOGRAM, URETEROSCOPY WITH HOLMIUM LASER AND STENT PLACEMENT;  Surgeon: Irine Seal, MD;  Location: [facility];  Service: Urology;  Laterality: Right;   CYSTOSCOPY/RETROGRADE/URETEROSCOPY/STONE EXTRACTION WITH BASKET Right 10/27/2017   Procedure: CYSTOSCOPY/RETROGRADE/URETEROSCOPY/STONE EXTRACTION WITH BASKET/URETERAL STENT PLACEMENT and laser;  Surgeon: Irine Seal, MD;  Location: [facility];  Service: Urology;  Laterality: Right;   EXTRACORPOREAL SHOCK WAVE LITHOTRIPSY Left 08/10/2020   Procedure: EXTRACORPOREAL SHOCK WAVE LITHOTRIPSY (ESWL);  Surgeon: Ardis Hughs, MD;  Location: [facility];  Service: Urology;  Laterality: Left;   HOLMIUM LASER APPLICATION Right [id number]   Procedure: POSSIBLE HOLMIUM LASER APPLICATION;  Surgeon: Irine Seal, MD;  Location: [facility];  Service: Urology;  Laterality: Right;   HOLMIUM LASER APPLICATION Right [id number]   Procedure: HOLMIUM LASER APPLICATION;  Surgeon: Cleon Gustin, MD;  Location: [facility];  Service: Urology;  Laterality: Right;   INTRAVASCULAR PRESSURE WIRE/FFR STUDY N/A 06/28/2016   Procedure: Intravascular Pressure Wire/FFR Study;  Surgeon: Leonie Man, MD;  Location: [place] [facility];  Service: Cardiovascular: FFR of mLAD ~65% lesion = 0.75 post --> Staged PCI   INTRAVASCULAR PRESSURE WIRE/FFR STUDY N/A 10/11/2020   Procedure: INTRAVASCULAR PRESSURE WIRE/FFR STUDY;  Surgeon: Leonie Man, MD;  Location: [place] [facility];  Service: Cardiovascular;  Laterality: LAD mid-distal just after D2 --> FFR 0.75 SIGNIFICANT => PCI   LEFT HEART CATH AND CORONARY  ANGIOGRAPHY  06/30/2003   Dr. Lia Foyer: NO Significant/Obstructive CAD; Preseved LVEF (for Recurrent Afib)   NASAL SINUS SURGERY     x 2   PROXIMAL INTERPHALANGEAL FUSION (PIP) Left 01-05-2001    dr graves   correction clawtoe and extensor tendon lengthening   RIGHT/LEFT HEART CATH AND CORONARY ANGIOGRAPHY N/A 06/28/2016   Procedure: Right/Left Heart Cath and Coronary Angiography;  Surgeon: Leonie Man, MD;  Location: [place] [facility];  Service: Cardiovascular: mRCA 99% (TIMI 2), mLAD ~65% (FFR 0.75), OM3 55%. mild Pulm HTN. Mod LVEDP elevation. EF 55-65%.   RIGHT/LEFT HEART CATH AND CORONARY ANGIOGRAPHY N/A 10/11/2020   Procedure: RIGHT/LEFT HEART CATH AND CORONARY ANGIOGRAPHY;  Surgeon: Leonie Man, MD;  Location: [place] [facility] LAB;; Patent DES in [place] & p-mLAD. CULPRIT = m-d LAD 70% (just after D1 w/ Ost 60%) FFR 0.75 => DES PCI m-dLAD & PTCA ost D1 (overlapping prior stent distally & crossing D2 w/  Ost D2 post PTCA). RIGHT HEART CATH PRESSURES,  & CO-CI ARE NORMAL. NO PULM HTN   ROTATOR CUFF REPAIR Bilateral last one 2014   TONSILLECTOMY  age 38   TRANSTHORACIC ECHOCARDIOGRAM  09/29/2020   EF > 75%, Hyperdynamic. No RWMA. Mild LVH. ~ DD with mild LA dilation. Mild AI. Normal RV size & fxn, ? Dilated IVC c/w CVP ~ 15 mHg.   URETEROSCOPIC LASER LITHOTRIPSY STONE EXTRACTIONS/  STENT PLACEMENT Bilateral 09-10-2007   dr Jeffie Pollock  [facility]   per pt has had several prior ureteroscopic stone extraction since age 4 , last one 09-10-2007       Home Medications    Prior to Admission medications   Medication Sig Start Date End Date Taking? Authorizing Provider  cephALEXin (KEFLEX) 500 MG capsule Take 1 capsule (500 mg total) by mouth 4 (four) times daily. 12/18/20  Yes Marney Setting, NP  acetaminophen (TYLENOL) 500 MG tablet Take 1,000 mg by mouth every 6 (six) hours as needed for mild pain or moderate pain.    [provider]  Adalimumab 40 MG/0.4ML PSKT Inject 40 mg into the  skin every 14 (fourteen) days.     [provider]  albuterol (VENTOLIN HFA) 108 (90 Base) MCG/ACT inhaler Inhale 2 puffs into the lungs every 6 (six) hours as needed for wheezing or shortness of breath. 06/16/20   Rigoberto Noel, MD  aspirin EC 81 MG tablet Take 1 tablet (81 mg total) by mouth daily. Swallow whole. 10/11/20   Leonie Man, MD  calcipotriene (DOVONOX) 0.005 % cream Apply 1 application topically 2 (two) times daily as needed (psoriasis).    [provider]  Calcium Carbonate-Vit D-Min (CALCIUM 1200 PO) Take 1,200  mg by mouth daily.    [provider]  cetirizine (ZYRTEC) 10 MG tablet Take 10 mg by mouth daily.    [provider]  clobetasol cream (TEMOVATE) [id number] % Apply 1 application topically 2 (two) times daily as needed (for psoriasis).    [provider]  clopidogrel (PLAVIX) 75 MG tablet Take 1 tablet (75 mg total) by mouth daily. 10/11/20   Cheryln Manly, NP  diltiazem (CARDIZEM CD) 180 MG 24 hr capsule Take 1 capsule by mouth once daily 07/10/20   Leonie Man, MD  ferrous sulfate 325 (65 FE) MG tablet Take 325 mg by mouth daily.    Deberah Pelton, NP  gabapentin (NEURONTIN) 300 MG capsule Take 300-600 mg by mouth See admin instructions. Take 300 mg in the morning and 600 mg in th evening    [provider]  galantamine (RAZADYNE ER) 8 MG 24 hr capsule Take 16 mg by mouth daily with breakfast. 08/30/20   [provider]  HYDROcodone-acetaminophen (NORCO/VICODIN) 5-325 MG tablet Take 1-2 tablets by mouth every 6 (six) hours as needed. 08/16/20   [provider]  memantine (NAMENDA) 10 MG tablet Take 20 mg by mouth at bedtime. 08/30/20   [provider]  metFORMIN (GLUCOPHAGE-XR) 500 MG 24 hr tablet Take 500 mg by mouth at bedtime.    [provider]  Polyethyl Glycol-Propyl Glycol (SYSTANE OP) Place 1 drop into both eyes daily as needed (Dry eye).    [provider]   predniSONE (DELTASONE) 5 MG tablet Take 10 mg on M, W, F, and 5 mg on other days. 06/16/20   Rigoberto Noel, MD  rosuvastatin (CRESTOR) 20 MG tablet Take 1 tablet (20 mg total) by mouth every evening. 04/03/20 10/11/20  Ledora Bottcher, PA  tamsulosin (FLOMAX) 0.4 MG CAPS capsule Take 0.4 mg by mouth daily.    [provider]    Family History Family History  Problem Relation Age of Onset   Coronary artery disease Mother    Breast cancer Mother    Colon cancer Mother    Heart disease Maternal Grandmother        MI   Stroke Paternal Grandfather        CVA   Coronary artery disease Paternal Grandfather    Esophageal cancer Neg Hx    Stomach cancer Neg Hx    Rectal cancer Neg Hx     Social History Social History   Tobacco Use   Smoking status: Former    Types: Cigars    Quit date: 09/29/1997    Years since quitting: 23.2   Smokeless tobacco: Former    Types: Snuff, Chew   Tobacco comments:    4-5 per day at his max  Vaping Use   Vaping Use: Never used  Substance Use Topics   Alcohol use: No   Drug use: No     Allergies   Hydrocodone, Other, Oxycodone, Pseudoephedrine, Tramadol, and Gemfibrozil   Review of Systems Review of Systems  Respiratory: Negative.    Cardiovascular: Negative.   Musculoskeletal:        Laceration to RT ring finger near tip of digit, bleeding controlled   Skin:  Positive for wound.    Physical Exam Triage Vital Signs ED Triage Vitals  Enc Vitals Group     BP 12/18/20 1533 (!) 169/75     Pulse Rate 12/18/20 1533 89     Resp 12/18/20 1533 17  Temp 12/18/20 1535 98.7 F (37.1 C)     Temp src --      SpO2 12/18/20 1533 95 %     Weight --      Height --      Head Circumference --      Peak Flow --      Pain Score 12/18/20 1533 4     Pain Loc --      Pain Edu? --      Excl. in [place]? --    No data found.  Updated Vital Signs BP (!) 169/75   Pulse 89   Temp 98.7 F (37.1 C)   Resp 17   SpO2 95%   Visual  Acuity Right Eye Distance:   Left Eye Distance:   Bilateral Distance:    Right Eye Near:   Left Eye Near:    Bilateral Near:     Physical Exam Constitutional:      Appearance: Normal appearance.  Cardiovascular:     Rate and Rhythm: Normal rate.  Pulmonary:     Effort: Pulmonary effort is normal.  Musculoskeletal:        General: Tenderness and signs of injury present.     Comments: RRF 1cm laceration to tip of digit, bleeding controlled. Full ROM , warm   Skin:    Capillary Refill: Capillary refill takes less than 2 seconds.  Neurological:     General: No focal deficit present.     Mental Status: He is alert.     UC Treatments / Results  Labs (all labs ordered are listed, but only abnormal results are displayed) Labs Reviewed - No data to display  EKG   Radiology No results found.  Procedures Procedures (including critical care time)  Medications Ordered in UC Medications - No data to display  Initial Impression / Assessment and Plan / UC Course  I have reviewed the triage vital signs and the nursing notes.  Pertinent labs & imaging results that were available during my care of the patient were reviewed by me and considered in my medical decision making (see chart for details).     Wash dry area apply neosporin daily  Leave dressing on today to help decrease the bleeding since you are on blood thinners  Take abx to prevent infection take with food The cut does not need stitches  Final Clinical Impressions(s) / UC Diagnoses   Final diagnoses:  Cut of finger     Discharge Instructions      Wash dry area apply neosporin daily  Leave dressing on today to help decrease the bleeding since you are on blood thinners  Take abx to prevent infection take with food The cut does not need stitches          ED Prescriptions     Medication Sig Dispense Auth. Provider   cephALEXin (KEFLEX) 500 MG capsule Take 1 capsule (500 mg total) by mouth 4 (four)  times daily. 20 capsule Marney Setting, NP      PDMP not reviewed this encounter.   Marney Setting, NP 12/18/20 1550

## 2020-12-18 NOTE — Discharge Instructions (Addendum)
Wash dry area apply neosporin daily  Leave dressing on today to help decrease the bleeding since you are on blood thinners  Take abx to prevent infection take with food The cut does not need stitches

## 2020-12-27 DIAGNOSIS — Z006 Encounter for examination for normal comparison and control in clinical research program: Secondary | ICD-10-CM

## 2020-12-27 NOTE — Research (Signed)
Tried to contact patient about AMGEN Lpa trial. No answer, voice mail left.

## 2021-01-03 ENCOUNTER — Telehealth: Payer: Self-pay | Admitting: Cardiology

## 2021-01-03 NOTE — Telephone Encounter (Signed)
Received a call from patient's wife.She stated husband has been in Afib most of week.He feels bad,weak,dizzy, no energy,sob.She does not know what his heart rates have been.Appointment scheduled with Afib clinic Monday 10/3 at 3:00 pm.She wanted to know if he could be worked in [place] schedule tomorrow.Advised Dr.Harding does not have any openings.No extender appointments.I will send message to Dr.Harding's RN.Advised if he worsens to go to ED.

## 2021-01-03 NOTE — Telephone Encounter (Signed)
Patient c/o Palpitations:  High priority if patient c/o lightheadedness, shortness of breath, or chest pain  How long have you had palpitations/irregular HR/ Afib? Are you having the symptoms now? This week  Are you currently experiencing lightheadedness, SOB or CP? SOB, lightheadedness, dizziness  Do you have a history of afib (atrial fibrillation) or irregular heart rhythm? Yes   Have you checked your BP or HR? (document readings if available): No  Are you experiencing any other symptoms? Currently in afib

## 2021-01-08 ENCOUNTER — Ambulatory Visit (HOSPITAL_COMMUNITY)
Admission: RE | Admit: 2021-01-08 | Discharge: 2021-01-08 | Disposition: A | Discharge status: Home / Self Care | Payer: Medicare Other | Source: Ambulatory Visit | Attending: Physician Assistant | Admitting: Physician Assistant

## 2021-01-08 ENCOUNTER — Other Ambulatory Visit: Payer: Self-pay

## 2021-01-08 ENCOUNTER — Encounter (HOSPITAL_COMMUNITY): Payer: Self-pay | Admitting: Physician Assistant

## 2021-01-08 VITALS — BP 130/70 | HR 84 | Ht 75.0 in | Wt 238.4 lb

## 2021-01-08 DIAGNOSIS — D6869 Other thrombophilia: Secondary | ICD-10-CM | POA: Insufficient documentation

## 2021-01-08 DIAGNOSIS — Z79899 Other long term (current) drug therapy: Secondary | ICD-10-CM | POA: Insufficient documentation

## 2021-01-08 DIAGNOSIS — Z7902 Long term (current) use of antithrombotics/antiplatelets: Secondary | ICD-10-CM | POA: Diagnosis not present

## 2021-01-08 DIAGNOSIS — I10 Essential (primary) hypertension: Secondary | ICD-10-CM | POA: Diagnosis not present

## 2021-01-08 DIAGNOSIS — I480 Paroxysmal atrial fibrillation: Secondary | ICD-10-CM | POA: Diagnosis not present

## 2021-01-08 DIAGNOSIS — I2510 Atherosclerotic heart disease of native coronary artery without angina pectoris: Secondary | ICD-10-CM | POA: Insufficient documentation

## 2021-01-08 DIAGNOSIS — Z7982 Long term (current) use of aspirin: Secondary | ICD-10-CM | POA: Diagnosis not present

## 2021-01-08 MED ORDER — DILTIAZEM HCL ER COATED BEADS 240 MG PO CP24: 240.0000 mg | ORAL_CAPSULE | Freq: Every day | ORAL | 3 refills | Status: DC

## 2021-01-08 NOTE — Progress Notes (Signed)
Patient ID: URI TURNBOUGH, male   DOB: May 08, 1945, 75 y.o.   MRN: [id number]       PCP: [facility]  Pulmonologist: Dr. Elsworth Soho Cardiologist: Dr. Ellyn Hack EP: Dr.Allred   Kenneth [name] is a 75 y.o. male who presents today in afib clinic, s/p afib/flutter ablation 10/15 by Dr. Rayann Heman,. Afib ablation 2006 at [facility], h/o of prior tikosyn/flecainde use. He did have a R/LHC on 10/11/20 with PCI to LAD. Patient reports that about two weeks ago he noted lightheadedness, fatigue, SOB, and an irregular pulse which have been typical of his afib symptoms in the past. He does feel better today and is in SR. There were no specific triggers that he could identify. He is not currently on anticoagulation.    Today, he denies symptoms of presyncope, or syncope. Positive for fatigue, exertional dyspnea   The patient is otherwise without complaint today as for above.  Past Medical History:  Diagnosis Date   Arthritis    CAD S/P percutaneous coronary angioplasty 06/28/2016   Cardiologist- Dr. Ellyn Hack: 06/28/16 -- [facility] PCI (Synergy DES 2.75 x 20 mm); 07/10/16: Staged p-m LAD PCI (FFR 0.75) - DES PCI (Synergy DES 3.5 x 20 mm); 10/11/2020: mid-distal LAD 70% (FFR 0.75) DES PCI (Synergy DES 2.5 mm x 32 mm - overlaps prox-mid stent, tapered post-dilation 3.7 - 2.6 mm.   Chronic diastolic heart failure (HCC)    (Cardiologist: Dr. Ellyn Hack): EF > 75% / Hyperdynamic LV Fxn - indeterminate Diastolic Fxn.   Chronic dryness of both eyes    Diverticulosis of colon    Epiretinal membrane    "epiretinal attachment" (06/28/2016) 10-14-2017 per pt oringinally in both , now only unilateral    GERD (gastroesophageal reflux disease)    Hiatal hernia    History of adenomatous polyp of colon    History of concussion 08/23/2017   w/o loc--- per pt no residual   History of kidney stones    History of rheumatic fever 1958   NO Evidence of Vavlular disease -> only mild Aortic Sclerosis   History of skin cancer    excision  leg;  froze the left arm--- unsure BCC or SCC    Hyperlipidemia    Hypertension    ILD (interstitial lung disease) ([place])    pulmologist-  dr Elsworth Soho--  secondary to methotrexate use --- last PFTs 04-25-2015  mild restriction   Iron deficiency anemia    Mild obstructive sleep apnea    10-04-2019 per pt  had a sleep study years ago , was told no cpap recommended pt denies    Nephrolithiasis    CT 09-07-2019 bilateral nonobstructive stones   Paroxysmal atrial fibrillation [facility]) cardiologist-  dr harding/  EP-- dr Rayann Heman   a. 11/2003 Tikosyn initiated - subsequently d/c'd;  b. 2006 RFCA for Afib @ [place];  c. 09/2011 Echo: EF 60-65%, Gr 2 DD;  d. DCCV 10/2011 , 04/2012, 12/ 2014, & 02/ 2015;  e. RFCA 01-13-2014   Plaque psoriasis 1969   followed by rheumatologist  and dermatologist @ [facility] in [place]   Right ureteral stone    S/P drug eluting coronary stent placement    06-28-2016  x1 to [place];  07-10-2016 x1 to mLAD   Seasonal allergies    Type 2 diabetes mellitus ([place])    followed by pcp   Ureteral calculus, right    Wears hearing aid in both ears    Past Surgical History:  Procedure Laterality Date   APPENDECTOMY  1996  ATRIAL FIBRILLATION ABLATION N/A 01/13/2014   repeat PVI, also left atrial ablation performed with successfull ablation of LA flutter by Dr Rayann Heman   ATRIAL FIBRILLATION ABLATION  08/2004   Dr Rolland Porter at [facility] [place])   [place] N/A 05/22/2013   Procedure: CARDIOVERSION;  Surgeon: [name], MD;  Location: [place];  Service: Cardiovascular;  Laterality: N/A;   CARDIOVERSION N/A 10/23/2011   Procedure: CARDIOVERSION;  Surgeon: Deboraha Sprang, MD;  Location: [facility] CATH LAB;  Service: Cardiovascular;  Laterality: N/A;   CARDIOVERSION N/A 05/01/2012   Procedure: CARDIOVERSION;  Surgeon: Deboraha Sprang, MD;  Location: [facility] CATH LAB;  Service: Cardiovascular;  Laterality: N/A;   CARPAL TUNNEL RELEASE Right 1980s   CATARACT EXTRACTION W/ INTRAOCULAR LENS  IMPLANT,  BILATERAL  2012  approx.   CORONARY STENT INTERVENTION N/A 06/28/2016   Procedure: Coronary Stent Intervention;  Surgeon: Leonie Man, MD;  Location: [place] CV LAB;  Service: Cardiovascular;  Laterality: mRCA PCI -Synergy DES 2.75 m x 20 mm (3.1 mm)   CORONARY STENT INTERVENTION N/A 07/10/2016   Procedure: Coronary Stent Intervention;  Surgeon: Leonie Man, MD;  Location: [place] CV LAB;  Service: Cardiovascular: mLAD PCI Synergy DES 3.5 mm x 20 mm)   CORONARY STENT INTERVENTION N/A 10/11/2020   Procedure: CORONARY STENT INTERVENTION;  Surgeon: Leonie Man, MD;  Location: [place] CV LAB;  Service: Cardiovascular: mid-distal LAD ~70% (FFR 0.75): SYNERGY DES 2.5 MM X 32 MM (overlaps prox-mid Stent from 07/2016) - Tapered post-dilation 3.7 mm ->2.6 mm; Jailed D2 PTCA reduced 60% to 40%.   CYSTOSCOPY WITH RETROGRADE PYELOGRAM, URETEROSCOPY AND STENT PLACEMENT Right 10/16/2017   Procedure: CYSTOSCOPY WITH RIGHT RETROGRADE RIGHT URETEROSCOPY AND STONE BASKET EXTRACTION;  Surgeon: Irine Seal, MD;  Location: [facility];  Service: Urology;  Laterality: Right;   CYSTOSCOPY WITH RETROGRADE PYELOGRAM, URETEROSCOPY AND STENT PLACEMENT Right 10/08/2019   Procedure: CYSTOSCOPY WITH URETEROSCOPY; RETROGRADE PYELOGRAM; STONE BASKET EXTRACTION;  STENT PLACEMENT;  Surgeon: Cleon Gustin, MD;  Location[facility];  Service: Urology;  Laterality: Right;  1 HR   CYSTOSCOPY WITH RETROGRADE PYELOGRAM, URETEROSCOPY AND STENT PLACEMENT Right 03/28/2020   Procedure: CYSTOSCOPY WITH RIGHT RETROGRADE PYELOGRAM, URETEROSCOPY WITH HOLMIUM LASER AND STENT PLACEMENT;  Surgeon: Irine Seal, MD;  Location: [facility];  Service: Urology;  Laterality: Right;   CYSTOSCOPY/RETROGRADE/URETEROSCOPY/STONE EXTRACTION WITH BASKET Right 10/27/2017   Procedure: CYSTOSCOPY/RETROGRADE/URETEROSCOPY/STONE EXTRACTION WITH BASKET/URETERAL STENT PLACEMENT and laser;  Surgeon: Irine Seal, MD;   Location: [facility];  Service: Urology;  Laterality: Right;   EXTRACORPOREAL SHOCK WAVE LITHOTRIPSY Left 08/10/2020   Procedure: EXTRACORPOREAL SHOCK WAVE LITHOTRIPSY (ESWL);  Surgeon: Ardis Hughs, MD;  Location: [facility];  Service: Urology;  Laterality: Left;   HOLMIUM LASER APPLICATION Right 79/05/4095   Procedure: POSSIBLE HOLMIUM LASER APPLICATION;  Surgeon: Irine Seal, MD;  Location: [facility];  Service: Urology;  Laterality: Right;   HOLMIUM LASER APPLICATION Right 35/32/9924   Procedure: HOLMIUM LASER APPLICATION;  Surgeon: Cleon Gustin, MD;  Location: [facility];  Service: Urology;  Laterality: Right;   INTRAVASCULAR PRESSURE WIRE/FFR STUDY N/A 06/28/2016   Procedure: Intravascular Pressure Wire/FFR Study;  Surgeon: Leonie Man, MD;  Location: [place] CV LAB;  Service: Cardiovascular: FFR of mLAD ~65% lesion = 0.75 post --> Staged PCI   INTRAVASCULAR PRESSURE WIRE/FFR STUDY N/A 10/11/2020   Procedure: INTRAVASCULAR PRESSURE WIRE/FFR STUDY;  Surgeon: Leonie Man, MD;  Location: [facility]  INVASIVE CV LAB;  Service: Cardiovascular;  Laterality: LAD mid-distal just after D2 --> FFR 0.75 SIGNIFICANT => PCI   LEFT HEART CATH AND CORONARY ANGIOGRAPHY  06/30/2003   Dr. Lia Foyer: NO Significant/Obstructive CAD; Preseved LVEF (for Recurrent Afib)   NASAL SINUS SURGERY     x 2   PROXIMAL INTERPHALANGEAL FUSION (PIP) Left 01-05-2001    dr graves   correction clawtoe and extensor tendon lengthening   RIGHT/LEFT HEART CATH AND CORONARY ANGIOGRAPHY N/A 06/28/2016   Procedure: Right/Left Heart Cath and Coronary Angiography;  Surgeon: Leonie Man, MD;  Location: [place] CV LAB;  Service: Cardiovascular: mRCA 99% (TIMI 2), mLAD ~65% (FFR 0.75), OM3 55%. mild Pulm HTN. Mod LVEDP elevation. EF 55-65%.   RIGHT/LEFT HEART CATH AND CORONARY ANGIOGRAPHY N/A 10/11/2020   Procedure: RIGHT/LEFT HEART CATH AND CORONARY ANGIOGRAPHY;  Surgeon: Leonie Man,  MD;  Location: [place] CV LAB;; Patent DES in [place] & p-mLAD. CULPRIT = m-d LAD 70% (just after D1 w/ Ost 60%) FFR 0.75 => DES PCI m-dLAD & PTCA ost D1 (overlapping prior stent distally & crossing D2 w/  Ost D2 post PTCA). RIGHT HEART CATH PRESSURES,  & CO-CI ARE NORMAL. NO PULM HTN   ROTATOR CUFF REPAIR Bilateral last one 2014   TONSILLECTOMY  age 73   TRANSTHORACIC ECHOCARDIOGRAM  09/29/2020   EF > 75%, Hyperdynamic. No RWMA. Mild LVH. ~ DD with mild LA dilation. Mild AI. Normal RV size & fxn, ? Dilated IVC c/w CVP ~ 15 mHg.   URETEROSCOPIC LASER LITHOTRIPSY STONE EXTRACTIONS/  STENT PLACEMENT Bilateral 09-10-2007   dr Jeffie Pollock  [facility]   per pt has had several prior ureteroscopic stone extraction since age 9 , last one 09-10-2007    ROS- all systems are reviewed and negatives except as per HPI above  Current Outpatient Medications  Medication Sig Dispense Refill   acetaminophen (TYLENOL) 500 MG tablet Take 1,000 mg by mouth every 6 (six) hours as needed for mild pain or moderate pain.     Adalimumab 40 MG/0.4ML PSKT Inject 40 mg into the skin every 14 (fourteen) days.      albuterol (VENTOLIN HFA) 108 (90 Base) MCG/ACT inhaler Inhale 2 puffs into the lungs every 6 (six) hours as needed for wheezing or shortness of breath. 8 g 6   aspirin EC 81 MG tablet Take 1 tablet (81 mg total) by mouth daily. Swallow whole. 30 tablet 0   calcipotriene (DOVONOX) 0.005 % cream Apply 1 application topically 2 (two) times daily as needed (psoriasis).     Calcium Carbonate-Vit D-Min (CALCIUM 1200 PO) Take 1,200 mg by mouth daily.     cetirizine (ZYRTEC) 10 MG tablet Take 10 mg by mouth daily.     clobetasol cream (TEMOVATE) 9.56 % Apply 1 application topically 2 (two) times daily as needed (for psoriasis).     clopidogrel (PLAVIX) 75 MG tablet Take 1 tablet (75 mg total) by mouth daily. 90 tablet 2   diltiazem (CARDIZEM CD) 180 MG 24 hr capsule Take 1 capsule by mouth once daily 90 capsule 1   ferrous sulfate  325 (65 FE) MG tablet Take 325 mg by mouth daily.     gabapentin (NEURONTIN) 300 MG capsule Take 300-600 mg by mouth See admin instructions. Take 300 mg in the morning and 600 mg in th evening     galantamine (RAZADYNE ER) 8 MG 24 hr capsule Take 16 mg by mouth daily with breakfast.     HYDROcodone-acetaminophen (NORCO/VICODIN)  5-325 MG tablet Take 1-2 tablets by mouth every 6 (six) hours as needed.     memantine (NAMENDA) 10 MG tablet Take 20 mg by mouth at bedtime.     metFORMIN (GLUCOPHAGE-XR) 500 MG 24 hr tablet Take 500 mg by mouth at bedtime.     Polyethyl Glycol-Propyl Glycol (SYSTANE OP) Place 1 drop into both eyes daily as needed (Dry eye).     prazosin (MINIPRESS) 1 MG capsule TAKE ONE CAPSULE BY MOUTH AT BEDTIME FOR 1 MONTH, THEN TAKE TWO CAPSULES AT BEDTIME FOR 1 MONTH, THEN TAKE THREE CAPSULES AT BEDTIME - NEW, TO REDUCE NIGHTMARES     predniSONE (DELTASONE) 5 MG tablet Take 10 mg on M, W, F, and 5 mg on other days. 60 tablet 3   rosuvastatin (CRESTOR) 20 MG tablet Take 1 tablet (20 mg total) by mouth every evening. 90 tablet 3   tamsulosin (FLOMAX) 0.4 MG CAPS capsule Take 0.4 mg by mouth daily.     No current facility-administered medications for this encounter.   Facility-Administered Medications Ordered in Other Encounters  Medication Dose Route Frequency Provider Last Rate Last Admin   lidocaine (cardiac) 100 mg/42ml (XYLOCAINE) 20 MG/ML injection 2%    Anesthesia Intra-op Flowers, Rokoshi T, CRNA   60 mg at 03/25/13 1617   propofol (DIPRIVAN) 10 mg/mL bolus/IV push    Anesthesia Intra-op Flowers, Rokoshi T, CRNA   90 mg at 03/25/13 1617    Physical Exam: Vitals:   01/08/21 1450  BP: 130/70  Pulse: 84  SpO2: 97%  Weight: 108.1 kg  Height: 6\' 3"  (1.905 m)    GEN- The patient is a well appearing elderly male, alert and oriented x 3 today.   HEENT-head normocephalic, atraumatic, sclera clear, conjunctiva pink, hearing intact, trachea midline. Lungs- Clear to ausculation  bilaterally, normal work of breathing Heart- Regular rate and rhythm, no murmurs, rubs or gallops  GI- soft, NT, ND, + BS Extremities- no clubbing, cyanosis, or edema MS- no significant deformity or atrophy Skin- no rash or lesion Psych- euthymic mood, full affect Neuro- strength and sensation are intact   EKG-  SR Vent. rate 84 BPM PR interval 186 ms QRS duration 82 ms QT/QTcB 368/434 ms  Epic records reviewed  CHA2DS2-VASc Score = 5  The patient's score is based upon: CHF History: 0 HTN History: 1 Diabetes History: 1 Stroke History: 0 Vascular Disease History: 1 Age Score: 2 Gender Score: 0      ASSESSMENT AND PLAN: 1. Paroxysmal Atrial Fibrillation (ICD10:  I48.0) The patient's CHA2DS2-VASc score is 5, indicating a 7.2% annual risk of stroke.   S/p ablation x 2 last one in 2015 Patient back in SR today.  Will increase diltiazem to 240 mg daily  We also discussed the risks and benefits of anticoagulation. He is currently on DAPT post PCI. Patient agreeable to [place] now. Will d/w Dr Ellyn Hack.  2. Secondary Hypercoagulable State (ICD10:  D68.69) The patient is at significant risk for stroke/thromboembolism based upon his CHA2DS2-VASc Score of 5.  Start  anticoagulation, see plans above .   3. HTN Stable, med changes as above.   4. CAD No anginal symptoms.   Follow up in the AF clinic in one month.    [facility] [address] [place], [place] [address] [phone]

## 2021-01-08 NOTE — Patient Instructions (Signed)
Increase cardizem (diltiazem) to 240mg  once a day

## 2021-01-10 DIAGNOSIS — Z006 Encounter for examination for normal comparison and control in clinical research program: Secondary | ICD-10-CM

## 2021-01-10 NOTE — Research (Signed)
Called patient to discuss Lpa trial with no answer. Voice message left asking for a return call

## 2021-01-12 ENCOUNTER — Other Ambulatory Visit: Payer: Self-pay | Admitting: Pulmonary Disease

## 2021-01-12 ENCOUNTER — Telehealth (HOSPITAL_COMMUNITY): Payer: Self-pay | Admitting: *Deleted

## 2021-01-12 DIAGNOSIS — J849 Interstitial pulmonary disease, unspecified: Secondary | ICD-10-CM

## 2021-01-12 DIAGNOSIS — U071 COVID-19: Secondary | ICD-10-CM | POA: Diagnosis not present

## 2021-01-12 MED ORDER — APIXABAN 5 MG PO TABS: 5.0000 mg | ORAL_TABLET | Freq: Two times a day (BID) | ORAL | 3 refills | Status: DC

## 2021-01-12 NOTE — Telephone Encounter (Signed)
-----   Message from Sherran Needs, NP sent at 01/11/2021  4:10 PM EDT ----- Please call pt with these instructions ----- Message ----- From: Oliver Barre, PA Sent: 01/09/2021   8:41 AM EDT To: Sherran Needs, NP  If starting DOAC -- then d/c ASA & use Plavix 75 mg + DOAC x ~6-12 months before d/c Plavix.  [name], MD  ----- Message ----- From: Oliver Barre, [place] Sent: 01/08/2021   4:43 PM EDT To: Leonie Man, MD, Sherran Needs, NP

## 2021-01-22 ENCOUNTER — Telehealth: Payer: Self-pay | Admitting: Cardiology

## 2021-01-22 NOTE — Telephone Encounter (Signed)
Spoke to pt's wife. She report for the past 2 weeks pt has been SOB with exertion, has no stamina, and can tell he is out of rhythm.   Appointment scheduled with Afib clinic on 10/20. Wife made aware and verbalized understanding.

## 2021-01-22 NOTE — Telephone Encounter (Signed)
Pt c/o Shortness Of Breath: STAT if SOB developed within the last 24 hours or pt is noticeably SOB on the phone  1. Are you currently SOB (can you hear that pt is SOB on the phone)? YES, I AM SPEAKING WITH THE WIFE SHERRY ON THE PHONE  2. How long have you been experiencing SOB? PT HAS BEEN SOB FOR A COUPLE OF WEEKS  3. Are you SOB when sitting or when up moving around? MOVING AROUND MOSTLY  4. Are you currently experiencing any other symptoms? NO ENERGY NO STAMINA   PER WIFE, [place] IS HARD OF HEARING SO PLEASE CONTACT SHERRY

## 2021-01-25 ENCOUNTER — Ambulatory Visit (HOSPITAL_COMMUNITY)
Admission: RE | Admit: 2021-01-25 | Discharge: 2021-01-25 | Disposition: A | Discharge status: Home / Self Care | Payer: Medicare Other | Source: Ambulatory Visit | Attending: Physician Assistant | Admitting: Physician Assistant

## 2021-01-25 ENCOUNTER — Other Ambulatory Visit: Payer: Self-pay

## 2021-01-25 ENCOUNTER — Encounter (HOSPITAL_COMMUNITY): Payer: Self-pay | Admitting: Physician Assistant

## 2021-01-25 ENCOUNTER — Telehealth: Payer: Self-pay | Admitting: Cardiology

## 2021-01-25 VITALS — BP 138/70 | HR 72 | Ht 75.0 in | Wt 229.0 lb

## 2021-01-25 DIAGNOSIS — I4892 Unspecified atrial flutter: Secondary | ICD-10-CM | POA: Diagnosis not present

## 2021-01-25 DIAGNOSIS — D6869 Other thrombophilia: Secondary | ICD-10-CM | POA: Diagnosis not present

## 2021-01-25 DIAGNOSIS — I25119 Atherosclerotic heart disease of native coronary artery with unspecified angina pectoris: Secondary | ICD-10-CM | POA: Diagnosis not present

## 2021-01-25 DIAGNOSIS — I10 Essential (primary) hypertension: Secondary | ICD-10-CM | POA: Insufficient documentation

## 2021-01-25 DIAGNOSIS — Z7901 Long term (current) use of anticoagulants: Secondary | ICD-10-CM | POA: Insufficient documentation

## 2021-01-25 DIAGNOSIS — I2510 Atherosclerotic heart disease of native coronary artery without angina pectoris: Secondary | ICD-10-CM | POA: Insufficient documentation

## 2021-01-25 DIAGNOSIS — Z7902 Long term (current) use of antithrombotics/antiplatelets: Secondary | ICD-10-CM | POA: Insufficient documentation

## 2021-01-25 DIAGNOSIS — I480 Paroxysmal atrial fibrillation: Secondary | ICD-10-CM | POA: Insufficient documentation

## 2021-01-25 DIAGNOSIS — Z79899 Other long term (current) drug therapy: Secondary | ICD-10-CM | POA: Insufficient documentation

## 2021-01-25 NOTE — Progress Notes (Signed)
Patient ID: Kenneth [name], male   DOB: 01-09-1946, 75 y.o.   MRN: [id number]       PCP: [facility]  Pulmonologist: Dr. Elsworth Soho Cardiologist: Dr. Ellyn Hack EP: Dr.Allred   Kenneth [name] is a 75 y.o. male who presents today in afib clinic, s/p afib/flutter ablation 10/15 by Dr. Rayann Heman,. Afib ablation 2006 at [facility], h/o of prior tikosyn/flecainde use. He did have a R/LHC on 10/11/20 with PCI to LAD. Patient reports that for the past two weeks he has had much more fatigue and dyspnea with exertion. He thought it was his afib because he could feel "skipped beats" while palpating his pulse. He is in SR today with PACs. When asked if his symptoms are similar to before his recent PCI he states it's "worse". No chest pain.   Today, he denies symptoms of palpitations, chest pain, orthopnea, PND, lower extremity edema, dizziness, presyncope, syncope, snoring, daytime somnolence, bleeding, or neurologic sequela. The patient is tolerating medications without difficulties and is otherwise without complaint today.  Past Medical History:  Diagnosis Date   Arthritis    CAD S/P percutaneous coronary angioplasty 06/28/2016   Cardiologist- Dr. Ellyn Hack: 06/28/16 -- [facility] PCI (Synergy DES 2.75 x 20 mm); 07/10/16: Staged p-m LAD PCI (FFR 0.75) - DES PCI (Synergy DES 3.5 x 20 mm); 10/11/2020: mid-distal LAD 70% (FFR 0.75) DES PCI (Synergy DES 2.5 mm x 32 mm - overlaps prox-mid stent, tapered post-dilation 3.7 - 2.6 mm.   Chronic diastolic heart failure (HCC)    (Cardiologist: Dr. Ellyn Hack): EF > 75% / Hyperdynamic LV Fxn - indeterminate Diastolic Fxn.   Chronic dryness of both eyes    Diverticulosis of colon    Epiretinal membrane    "epiretinal attachment" (06/28/2016) 10-14-2017 per pt oringinally in both , now only unilateral    GERD (gastroesophageal reflux disease)    Hiatal hernia    History of adenomatous polyp of colon    History of concussion 08/23/2017   w/o loc--- per pt no residual   History of  kidney stones    History of rheumatic fever 1958   NO Evidence of Vavlular disease -> only mild Aortic Sclerosis   History of skin cancer    excision leg;  froze the left arm--- unsure BCC or SCC    Hyperlipidemia    Hypertension    ILD (interstitial lung disease) ([place])    pulmologist-  dr Elsworth Soho--  secondary to methotrexate use --- last PFTs 04-25-2015  mild restriction   Iron deficiency anemia    Mild obstructive sleep apnea    10-04-2019 per pt  had a sleep study years ago , was told no cpap recommended pt denies    Nephrolithiasis    CT 09-07-2019 bilateral nonobstructive stones   Paroxysmal atrial fibrillation [facility]) cardiologist-  dr harding/  EP-- dr Rayann Heman   a. 11/2003 Tikosyn initiated - subsequently d/c'd;  b. 2006 RFCA for Afib @ [place];  c. 09/2011 Echo: EF 60-65%, Gr 2 DD;  d. DCCV 10/2011 , 04/2012, 12/ 2014, & 02/ 2015;  e. RFCA 01-13-2014   Plaque psoriasis 1969   followed by rheumatologist  and dermatologist @ [place] in [place]   Right ureteral stone    S/P drug eluting coronary stent placement    06-28-2016  x1 to [place];  07-10-2016 x1 to mLAD   Seasonal allergies    Type 2 diabetes mellitus ([place])    followed by pcp   Ureteral calculus, right    Wears hearing  aid in both ears    Past Surgical History:  Procedure Laterality Date   [place] N/A 01/13/2014   repeat PVI, also left atrial ablation performed with successfull ablation of LA flutter by Dr Rayann Heman   ATRIAL FIBRILLATION ABLATION  08/2004   Dr Rolland Porter at [facility] , [place])   [place] N/A 05/22/2013   Procedure: CARDIOVERSION;  Surgeon: [name], MD;  Location: [place];  Service: Cardiovascular;  Laterality: N/A;   CARDIOVERSION N/A 10/23/2011   Procedure: CARDIOVERSION;  Surgeon: Deboraha Sprang, MD;  Location: [facility] CATH LAB;  Service: Cardiovascular;  Laterality: N/A;   CARDIOVERSION N/A 05/01/2012   Procedure: CARDIOVERSION;  Surgeon: Deboraha Sprang, MD;   Location: [facility] CATH LAB;  Service: Cardiovascular;  Laterality: N/A;   CARPAL TUNNEL RELEASE Right 1980s   CATARACT EXTRACTION W/ INTRAOCULAR LENS  IMPLANT, BILATERAL  2012  approx.   CORONARY STENT INTERVENTION N/A 06/28/2016   Procedure: Coronary Stent Intervention;  Surgeon: Leonie Man, MD;  Location: [place] CV LAB;  Service: Cardiovascular;  Laterality: mRCA PCI -Synergy DES 2.75 m x 20 mm (3.1 mm)   CORONARY STENT INTERVENTION N/A 07/10/2016   Procedure: Coronary Stent Intervention;  Surgeon: Leonie Man, MD;  Location: [place] CV LAB;  Service: Cardiovascular: mLAD PCI Synergy DES 3.5 mm x 20 mm)   CORONARY STENT INTERVENTION N/A 10/11/2020   Procedure: CORONARY STENT INTERVENTION;  Surgeon: Leonie Man, MD;  Location: [place] CV LAB;  Service: Cardiovascular: mid-distal LAD ~70% (FFR 0.75): SYNERGY DES 2.5 MM X 32 MM (overlaps prox-mid Stent from 07/2016) - Tapered post-dilation 3.7 mm ->2.6 mm; Jailed D2 PTCA reduced 60% to 40%.   CYSTOSCOPY WITH RETROGRADE PYELOGRAM, URETEROSCOPY AND STENT PLACEMENT Right 10/16/2017   Procedure: CYSTOSCOPY WITH RIGHT RETROGRADE RIGHT URETEROSCOPY AND STONE BASKET EXTRACTION;  Surgeon: Irine Seal, MD;  Location: [facility];  Service: Urology;  Laterality: Right;   CYSTOSCOPY WITH RETROGRADE PYELOGRAM, URETEROSCOPY AND STENT PLACEMENT Right 10/08/2019   Procedure: CYSTOSCOPY WITH URETEROSCOPY; RETROGRADE PYELOGRAM; STONE BASKET EXTRACTION;  STENT PLACEMENT;  Surgeon: Cleon Gustin, MD;  Location: [facility];  Service: Urology;  Laterality: Right;  1 HR   CYSTOSCOPY WITH RETROGRADE PYELOGRAM, URETEROSCOPY AND STENT PLACEMENT Right 03/28/2020   Procedure: CYSTOSCOPY WITH RIGHT RETROGRADE PYELOGRAM, URETEROSCOPY WITH HOLMIUM LASER AND STENT PLACEMENT;  Surgeon: Irine Seal, MD;  Location: [facility];  Service: Urology;  Laterality: Right;   CYSTOSCOPY/RETROGRADE/URETEROSCOPY/STONE EXTRACTION WITH BASKET  Right 10/27/2017   Procedure: CYSTOSCOPY/RETROGRADE/URETEROSCOPY/STONE EXTRACTION WITH BASKET/URETERAL STENT PLACEMENT and laser;  Surgeon: Irine Seal, MD;  Location: [facility];  Service: Urology;  Laterality: Right;   EXTRACORPOREAL SHOCK WAVE LITHOTRIPSY Left 08/10/2020   Procedure: EXTRACORPOREAL SHOCK WAVE LITHOTRIPSY (ESWL);  Surgeon: Ardis Hughs, MD;  Location: [facility];  Service: Urology;  Laterality: Left;   HOLMIUM LASER APPLICATION Right 99/24/2683   Procedure: POSSIBLE HOLMIUM LASER APPLICATION;  Surgeon: Irine Seal, MD;  Location: [facility];  Service: Urology;  Laterality: Right;   HOLMIUM LASER APPLICATION Right 41/96/2229   Procedure: HOLMIUM LASER APPLICATION;  Surgeon: Cleon Gustin, MD;  Location: [place];  Service: Urology;  Laterality: Right;   INTRAVASCULAR PRESSURE WIRE/FFR STUDY N/A 06/28/2016   Procedure: Intravascular Pressure Wire/FFR Study;  Surgeon: Leonie Man, MD;  Location: [place] CV LAB;  Service: Cardiovascular: FFR of mLAD ~65% lesion = 0.75 post --> Staged PCI   INTRAVASCULAR  PRESSURE WIRE/FFR STUDY N/A 10/11/2020   Procedure: INTRAVASCULAR PRESSURE WIRE/FFR STUDY;  Surgeon: Leonie Man, MD;  Location: [place] CV LAB;  Service: Cardiovascular;  Laterality: LAD mid-distal just after D2 --> FFR 0.75 SIGNIFICANT => PCI   LEFT HEART CATH AND CORONARY ANGIOGRAPHY  06/30/2003   Dr. Lia Foyer: NO Significant/Obstructive CAD; Preseved LVEF (for Recurrent Afib)   NASAL SINUS SURGERY     x 2   PROXIMAL INTERPHALANGEAL FUSION (PIP) Left 01-05-2001    dr graves   correction clawtoe and extensor tendon lengthening   RIGHT/LEFT HEART CATH AND CORONARY ANGIOGRAPHY N/A 06/28/2016   Procedure: Right/Left Heart Cath and Coronary Angiography;  Surgeon: Leonie Man, MD;  Location: [place] CV LAB;  Service: Cardiovascular: mRCA 99% (TIMI 2), mLAD ~65% (FFR 0.75), OM3 55%. mild Pulm HTN. Mod LVEDP elevation. EF  55-65%.   RIGHT/LEFT HEART CATH AND CORONARY ANGIOGRAPHY N/A 10/11/2020   Procedure: RIGHT/LEFT HEART CATH AND CORONARY ANGIOGRAPHY;  Surgeon: Leonie Man, MD;  Location: [place] CV LAB;; Patent DES in [place] & p-mLAD. CULPRIT = m-d LAD 70% (just after D1 w/ Ost 60%) FFR 0.75 => DES PCI m-dLAD & PTCA ost D1 (overlapping prior stent distally & crossing D2 w/  Ost D2 post PTCA). RIGHT HEART CATH PRESSURES,  & CO-CI ARE NORMAL. NO PULM HTN   ROTATOR CUFF REPAIR Bilateral last one 2014   TONSILLECTOMY  age 33   TRANSTHORACIC ECHOCARDIOGRAM  09/29/2020   EF > 75%, Hyperdynamic. No RWMA. Mild LVH. ~ DD with mild LA dilation. Mild AI. Normal RV size & fxn, ? Dilated IVC c/w CVP ~ 15 mHg.   URETEROSCOPIC LASER LITHOTRIPSY STONE EXTRACTIONS/  STENT PLACEMENT Bilateral 09-10-2007   dr Jeffie Pollock  [facility]   per pt has had several prior ureteroscopic stone extraction since age 43 , last one 09-10-2007    ROS- all systems are reviewed and negatives except as per HPI above  Current Outpatient Medications  Medication Sig Dispense Refill   acetaminophen (TYLENOL) 500 MG tablet Take 1,000 mg by mouth every 6 (six) hours as needed for mild pain or moderate pain.     Adalimumab 40 MG/0.4ML PSKT Inject 40 mg into the skin every 14 (fourteen) days.      albuterol (VENTOLIN HFA) 108 (90 Base) MCG/ACT inhaler Inhale 2 puffs into the lungs every 6 (six) hours as needed for wheezing or shortness of breath. 8 g 6   apixaban (ELIQUIS) 5 MG TABS tablet Take 1 tablet (5 mg total) by mouth 2 (two) times daily. 60 tablet 3   calcipotriene (DOVONOX) 0.005 % cream Apply 1 application topically 2 (two) times daily as needed (psoriasis).     Calcium Carbonate-Vit D-Min (CALCIUM 1200 PO) Take 1,200 mg by mouth daily.     cetirizine (ZYRTEC) 10 MG tablet Take 10 mg by mouth daily.     clobetasol cream (TEMOVATE) 2.58 % Apply 1 application topically 2 (two) times daily as needed (for psoriasis).     clopidogrel (PLAVIX) 75 MG  tablet Take 1 tablet (75 mg total) by mouth daily. 90 tablet 2   diltiazem (CARTIA XT) 240 MG 24 hr capsule Take 1 capsule (240 mg total) by mouth daily. 30 capsule 3   ferrous sulfate 325 (65 FE) MG tablet Take 325 mg by mouth daily.     gabapentin (NEURONTIN) 300 MG capsule Take 300-600 mg by mouth See admin instructions. Take 300 mg in the morning and 600 mg in th evening  galantamine (RAZADYNE ER) 8 MG 24 hr capsule Take 16 mg by mouth daily with breakfast.     HYDROcodone-acetaminophen (NORCO/VICODIN) 5-325 MG tablet Take 1-2 tablets by mouth every 6 (six) hours as needed.     memantine (NAMENDA) 10 MG tablet Take 20 mg by mouth at bedtime.     metFORMIN (GLUCOPHAGE-XR) 500 MG 24 hr tablet Take 500 mg by mouth at bedtime.     Polyethyl Glycol-Propyl Glycol (SYSTANE OP) Place 1 drop into both eyes daily as needed (Dry eye).     prazosin (MINIPRESS) 1 MG capsule TAKE ONE CAPSULE BY MOUTH AT BEDTIME FOR 1 MONTH, THEN TAKE TWO CAPSULES AT BEDTIME FOR 1 MONTH, THEN TAKE THREE CAPSULES AT BEDTIME - NEW, TO REDUCE NIGHTMARES     predniSONE (DELTASONE) 5 MG tablet Take 1 tablet by mouth once daily with breakfast 30 tablet 3   tamsulosin (FLOMAX) 0.4 MG CAPS capsule Take 0.4 mg by mouth daily.     rosuvastatin (CRESTOR) 20 MG tablet Take 1 tablet (20 mg total) by mouth every evening. 90 tablet 3   No current facility-administered medications for this encounter.   Facility-Administered Medications Ordered in Other Encounters  Medication Dose Route Frequency Provider Last Rate Last Admin   lidocaine (cardiac) 100 mg/52ml (XYLOCAINE) 20 MG/ML injection 2%    Anesthesia Intra-op Flowers, Rokoshi T, CRNA   60 mg at 03/25/13 1617   propofol (DIPRIVAN) 10 mg/mL bolus/IV push    Anesthesia Intra-op Flowers, Rokoshi T, CRNA   90 mg at 03/25/13 1617    Physical Exam: Vitals:   01/25/21 0924  BP: 138/70  Pulse: 72  SpO2: 98%  Weight: 103.9 kg  Height: 6\' 3"  (1.905 m)    GEN- The patient is a well  appearing elderly male, alert and oriented x 3 today.   HEENT-head normocephalic, atraumatic, sclera clear, conjunctiva pink, hearing intact, trachea midline. Lungs- Clear to ausculation bilaterally, normal work of breathing Heart- Regular rate and rhythm, occasional ectopic beat, no murmurs, rubs or gallops  GI- soft, NT, ND, + BS Extremities- no clubbing, cyanosis, or edema MS- no significant deformity or atrophy Skin- no rash or lesion Psych- euthymic mood, full affect Neuro- strength and sensation are intact   EKG-  SR, PACs Vent. rate 72 BPM PR interval 178 ms QRS duration 82 ms QT/QTcB 388/424 ms  Echo 09/29/20 demonstrated 1. Left ventricular ejection fraction, by estimation, is >75%. The left  ventricle has hyperdynamic function. The left ventricle has no regional  wall motion abnormalities. There is mild left ventricular hypertrophy.  Left ventricular diastolic parameters are indeterminate.   2. Right ventricular systolic function is normal. The right ventricular  size is normal. There is normal pulmonary artery systolic pressure.   3. Left atrial size was mildly dilated.   4. Mild mitral valve regurgitation.   5. The aortic valve is abnormal. Aortic valve regurgitation is mild.   6. The inferior vena cava is dilated in size with <50% respiratory  variability, suggesting right atrial pressure of 15 mmHg.   Epic records reviewed  CHA2DS2-VASc Score = 5  The patient's score is based upon: CHF History: 0 HTN History: 1 Diabetes History: 1 Stroke History: 0 Vascular Disease History: 1 Age Score: 2 Gender Score: 0      ASSESSMENT AND PLAN: 1. Paroxysmal Atrial Fibrillation (ICD10:  I48.0) The patient's CHA2DS2-VASc score is 5, indicating a 7.2% annual risk of stroke.   S/p ablation x 2 last one in 2015 Patient is  in SR today. Suspect irregular pulse due to PACs.  Continue diltiazem 240 mg daily  Continue Eliquis 5 mg BID  2. Secondary Hypercoagulable State  (ICD10:  D68.69) The patient is at significant risk for stroke/thromboembolism based upon his CHA2DS2-VASc Score of 5.  Continue Apixaban (Eliquis).   3. HTN Stable, no changes today.  4. CAD Concerned that fatigue and worsening dyspnea on exertion are anginal equivalent given his presentation a few months ago before his PCI. No ischemic changes on ECG today.  Will have him follow up with Dr Ellyn Hack or APP.  ED precautions given.    Follow up with Dr Ellyn Hack or APP at next available appointment.    [facility] [address] [place], [place] [address] [phone]

## 2021-01-25 NOTE — Telephone Encounter (Signed)
I got a secure chat from [employer], scheduler over at the afib clinic. She stated that the patient needed a f/u next week with Dr. Ellyn Hack or an APP per Adline Peals for SOB. Scheduled pt with Laurann [place] for 01/31/21 at [place]. [place] states that Dr. Ellyn Hack placed this patient's stents back in July so just wanted to make Dr. Ellyn Hack and his nurse aware.

## 2021-01-27 NOTE — Telephone Encounter (Signed)
I also just did R&LHC on him in July for SOB.  He does have chronic dyspnea.    [name], MD

## 2021-01-31 ENCOUNTER — Ambulatory Visit (INDEPENDENT_AMBULATORY_CARE_PROVIDER_SITE_OTHER): Payer: Medicare Other | Admitting: Family

## 2021-01-31 ENCOUNTER — Encounter (HOSPITAL_BASED_OUTPATIENT_CLINIC_OR_DEPARTMENT_OTHER): Payer: Self-pay | Admitting: Cardiology

## 2021-01-31 ENCOUNTER — Ambulatory Visit (INDEPENDENT_AMBULATORY_CARE_PROVIDER_SITE_OTHER): Payer: Medicare Other

## 2021-01-31 ENCOUNTER — Encounter (HOSPITAL_BASED_OUTPATIENT_CLINIC_OR_DEPARTMENT_OTHER): Payer: Self-pay | Admitting: Family

## 2021-01-31 ENCOUNTER — Other Ambulatory Visit: Payer: Self-pay

## 2021-01-31 VITALS — BP 124/80 | HR 82 | Ht 75.0 in | Wt 234.9 lb

## 2021-01-31 DIAGNOSIS — I25118 Atherosclerotic heart disease of native coronary artery with other forms of angina pectoris: Secondary | ICD-10-CM

## 2021-01-31 DIAGNOSIS — D6869 Other thrombophilia: Secondary | ICD-10-CM | POA: Diagnosis not present

## 2021-01-31 DIAGNOSIS — R002 Palpitations: Secondary | ICD-10-CM

## 2021-01-31 DIAGNOSIS — R0609 Other forms of dyspnea: Secondary | ICD-10-CM

## 2021-01-31 DIAGNOSIS — I480 Paroxysmal atrial fibrillation: Secondary | ICD-10-CM

## 2021-01-31 DIAGNOSIS — E785 Hyperlipidemia, unspecified: Secondary | ICD-10-CM | POA: Diagnosis not present

## 2021-01-31 NOTE — Progress Notes (Signed)
Office Visit    Patient Name: Kenneth [name] Date of Encounter: 01/31/2021  PCP:  Clinic, [facility]  Cardiologist:  [name], MD  Advanced Practice Provider:  No care team member to display Electrophysiologist:  None    Chief Complaint    Kenneth [name] is a 75 y.o. male with a hx of atrial fibrillation/flutter s/p ablation 01/2014, CAd s/p PCI to LAD 12/6757, chronic diastolic heart failure, HLD presents today for shortness of breath   Past Medical History    Past Medical History:  Diagnosis Date   Arthritis    CAD S/P percutaneous coronary angioplasty 06/28/2016   Cardiologist- Dr. Ellyn Hack: 06/28/16 -- [facility] PCI (Synergy DES 2.75 x 20 mm); 07/10/16: Staged p-m LAD PCI (FFR 0.75) - DES PCI (Synergy DES 3.5 x 20 mm); 10/11/2020: mid-distal LAD 70% (FFR 0.75) DES PCI (Synergy DES 2.5 mm x 32 mm - overlaps prox-mid stent, tapered post-dilation 3.7 - 2.6 mm.   Chronic diastolic heart failure (HCC)    (Cardiologist: Dr. Ellyn Hack): EF > 75% / Hyperdynamic LV Fxn - indeterminate Diastolic Fxn.   Chronic dryness of both eyes    Diverticulosis of colon    Epiretinal membrane    "epiretinal attachment" (06/28/2016) 10-14-2017 per pt oringinally in both , now only unilateral    GERD (gastroesophageal reflux disease)    Hiatal hernia    History of adenomatous polyp of colon    History of concussion 08/23/2017   w/o loc--- per pt no residual   History of kidney stones    History of rheumatic fever 1958   NO Evidence of Vavlular disease -> only mild Aortic Sclerosis   History of skin cancer    excision leg;  froze the left arm--- unsure BCC or SCC    Hyperlipidemia    Hypertension    ILD (interstitial lung disease) ([place])    pulmologist-  dr Elsworth Soho--  secondary to methotrexate use --- last PFTs 04-25-2015  mild restriction   Iron deficiency anemia    Mild obstructive sleep apnea    10-04-2019 per pt  had a sleep study years ago , was told  no cpap recommended pt denies    Nephrolithiasis    CT 09-07-2019 bilateral nonobstructive stones   Paroxysmal atrial fibrillation [facility]) cardiologist-  dr harding/  EP-- dr Rayann Heman   a. 11/2003 Tikosyn initiated - subsequently d/c'd;  b. 2006 RFCA for Afib @ [place];  c. 09/2011 Echo: EF 60-65%, Gr 2 DD;  d. DCCV 10/2011 , 04/2012, 12/ 2014, & 02/ 2015;  e. RFCA 01-13-2014   Plaque psoriasis 1969   followed by rheumatologist  and dermatologist @ [facility] in [employer]   Right ureteral stone    S/P drug eluting coronary stent placement    06-28-2016  x1 to mRCA;  07-10-2016 x1 to mLAD   Seasonal allergies    Type 2 diabetes mellitus ([place])    followed by pcp   Ureteral calculus, right    Wears hearing aid in both ears    Past Surgical History:  Procedure Laterality Date   [place] N/A 01/13/2014   repeat PVI, also left atrial ablation performed with successfull ablation of LA flutter by Dr Rayann Heman   ATRIAL FIBRILLATION ABLATION  08/2004   Dr Rolland Porter at [facility] , [place])   [place] N/A 05/22/2013   Procedure: CARDIOVERSION;  Surgeon: [name], MD;  Location: [place];  Service: Cardiovascular;  Laterality: N/A;   CARDIOVERSION N/A 10/23/2011   Procedure: CARDIOVERSION;  Surgeon: Deboraha Sprang, MD;  Location: [facility] CATH LAB;  Service: Cardiovascular;  Laterality: N/A;   CARDIOVERSION N/A 05/01/2012   Procedure: CARDIOVERSION;  Surgeon: Deboraha Sprang, MD;  Location: [facility] CATH LAB;  Service: Cardiovascular;  Laterality: N/A;   CARPAL TUNNEL RELEASE Right 1980s   CATARACT EXTRACTION W/ INTRAOCULAR LENS  IMPLANT, BILATERAL  2012  approx.   CORONARY STENT INTERVENTION N/A 06/28/2016   Procedure: Coronary Stent Intervention;  Surgeon: Leonie Man, MD;  Location: [place] CV LAB;  Service: Cardiovascular;  Laterality: mRCA PCI -Synergy DES 2.75 m x 20 mm (3.1 mm)   CORONARY STENT INTERVENTION N/A 07/10/2016   Procedure: Coronary Stent Intervention;   Surgeon: Leonie Man, MD;  Location: [place] CV LAB;  Service: Cardiovascular: mLAD PCI Synergy DES 3.5 mm x 20 mm)   CORONARY STENT INTERVENTION N/A 10/11/2020   Procedure: CORONARY STENT INTERVENTION;  Surgeon: Leonie Man, MD;  Location: [place] CV LAB;  Service: Cardiovascular: mid-distal LAD ~70% (FFR 0.75): SYNERGY DES 2.5 MM X 32 MM (overlaps prox-mid Stent from 07/2016) - Tapered post-dilation 3.7 mm ->2.6 mm; Jailed D2 PTCA reduced 60% to 40%.   CYSTOSCOPY WITH RETROGRADE PYELOGRAM, URETEROSCOPY AND STENT PLACEMENT Right 10/16/2017   Procedure: CYSTOSCOPY WITH RIGHT RETROGRADE RIGHT URETEROSCOPY AND STONE BASKET EXTRACTION;  Surgeon: Irine Seal, MD;  Location: [facility];  Service: Urology;  Laterality: Right;   CYSTOSCOPY WITH RETROGRADE PYELOGRAM, URETEROSCOPY AND STENT PLACEMENT Right 10/08/2019   Procedure: CYSTOSCOPY WITH URETEROSCOPY; RETROGRADE PYELOGRAM; STONE BASKET EXTRACTION;  STENT PLACEMENT;  Surgeon: Cleon Gustin, MD;  Location: [facility];  Service: Urology;  Laterality: Right;  1 HR   CYSTOSCOPY WITH RETROGRADE PYELOGRAM, URETEROSCOPY AND STENT PLACEMENT Right 03/28/2020   Procedure: CYSTOSCOPY WITH RIGHT RETROGRADE PYELOGRAM, URETEROSCOPY WITH HOLMIUM LASER AND STENT PLACEMENT;  Surgeon: Irine Seal, MD;  Location: [facility];  Service: Urology;  Laterality: Right;   CYSTOSCOPY/RETROGRADE/URETEROSCOPY/STONE EXTRACTION WITH BASKET Right 10/27/2017   Procedure: CYSTOSCOPY/RETROGRADE/URETEROSCOPY/STONE EXTRACTION WITH BASKET/URETERAL STENT PLACEMENT and laser;  Surgeon: Irine Seal, MD;  Location: [facility];  Service: Urology;  Laterality: Right;   EXTRACORPOREAL SHOCK WAVE LITHOTRIPSY Left 08/10/2020   Procedure: EXTRACORPOREAL SHOCK WAVE LITHOTRIPSY (ESWL);  Surgeon: Ardis Hughs, MD;  [facility];  Service: Urology;  Laterality: Left;   HOLMIUM LASER APPLICATION Right 86/76/7209   Procedure:  POSSIBLE HOLMIUM LASER APPLICATION;  Surgeon: Irine Seal, MD;  Location: [facility];  Service: Urology;  Laterality: Right;   HOLMIUM LASER APPLICATION Right 47/12/6281   Procedure: HOLMIUM LASER APPLICATION;  Surgeon: Cleon Gustin, MD;  Location: [facility];  Service: Urology;  Laterality: Right;   INTRAVASCULAR PRESSURE WIRE/FFR STUDY N/A 06/28/2016   Procedure: Intravascular Pressure Wire/FFR Study;  Surgeon: Leonie Man, MD;  Location: [place] CV LAB;  Service: Cardiovascular: FFR of mLAD ~65% lesion = 0.75 post --> Staged PCI   INTRAVASCULAR PRESSURE WIRE/FFR STUDY N/A 10/11/2020   Procedure: INTRAVASCULAR PRESSURE WIRE/FFR STUDY;  Surgeon: Leonie Man, MD;  Location: [place] CV LAB;  Service: Cardiovascular;  Laterality: LAD mid-distal just after D2 --> FFR 0.75 SIGNIFICANT => PCI   LEFT HEART CATH AND CORONARY ANGIOGRAPHY  06/30/2003   Dr. Lia Foyer: NO Significant/Obstructive CAD; Preseved LVEF (for Recurrent Afib)   NASAL SINUS SURGERY     x 2   PROXIMAL INTERPHALANGEAL FUSION (PIP) Left  01-05-2001    dr graves   correction clawtoe and extensor tendon lengthening   RIGHT/LEFT HEART CATH AND CORONARY ANGIOGRAPHY N/A 06/28/2016   Procedure: Right/Left Heart Cath and Coronary Angiography;  Surgeon: Leonie Man, MD;  Location: [place] CV LAB;  Service: Cardiovascular: mRCA 99% (TIMI 2), mLAD ~65% (FFR 0.75), OM3 55%. mild Pulm HTN. Mod LVEDP elevation. EF 55-65%.   RIGHT/LEFT HEART CATH AND CORONARY ANGIOGRAPHY N/A 10/11/2020   Procedure: RIGHT/LEFT HEART CATH AND CORONARY ANGIOGRAPHY;  Surgeon: Leonie Man, MD;  Location: [place] CV LAB;; Patent DES in [place] & p-mLAD. CULPRIT = m-d LAD 70% (just after D1 w/ Ost 60%) FFR 0.75 => DES PCI m-dLAD & PTCA ost D1 (overlapping prior stent distally & crossing D2 w/  Ost D2 post PTCA). RIGHT HEART CATH PRESSURES,  & CO-CI ARE NORMAL. NO PULM HTN   ROTATOR CUFF REPAIR Bilateral last one 2014    TONSILLECTOMY  age 68   TRANSTHORACIC ECHOCARDIOGRAM  09/29/2020   EF > 75%, Hyperdynamic. No RWMA. Mild LVH. ~ DD with mild LA dilation. Mild AI. Normal RV size & fxn, ? Dilated IVC c/w CVP ~ 15 mHg.   URETEROSCOPIC LASER LITHOTRIPSY STONE EXTRACTIONS/  STENT PLACEMENT Bilateral 09-10-2007   dr Jeffie Pollock  [facility]   per pt has had several prior ureteroscopic stone extraction since age 44 , last one 09-10-2007    Allergies  Allergies  Allergen Reactions   Hydrocodone Shortness Of Breath    In combination with decongestants   Other Palpitations and Other (See Comments)    ALL DECONGESTANTS - CAUSE PALPITATIONS; THROWS HEART RHYTHM OUT OF BALANCE   Oxycodone Shortness Of Breath   Pseudoephedrine Palpitations   Tramadol Shortness Of Breath and Rash    flushing   Gemfibrozil Other (See Comments)    Dizziness      History of Present Illness    Kenneth [name] is a 75 y.o. male with a hx of atrial fibrillation/flutter s/p ablation 01/2014, CAd s/p PCI to LAD 07/6657, chronic diastolic heart failure, HLD, ILD, HTN last seen by atrial fibrillation clinic 01/25/21.  Prior atrial flutter ablation in 2006 and atrial fibrillation ablation 01/2014. March 2018 he had R/LHC with mild pulmonary hypertension, severe mRCA 95% (DES PCI), and prox to mid LAD 70% with FFR 0.75. He underwent staged DES to LAD 07/2016. He was seen 09/2020 noting exertional dyspnea similar to anginal equivalent. Echo 09/29/20 LVEF >75%, mild LVH, diastolic dysfunction, mild AI. He underwent subequent L/RHC with mid to distal LAD 70% treated with DES (overlapping prior stent distally and crossing D2). RHC and cardiac output were normal with no pulmonary hypertension.   Previous intolerance to beta blocker with fatigue.   He presents today for dyspnea after recommendation for sooner follow up by atrial fibrillation clinic. Tells me his heart gts out of rhythm and then he feels more short of breath. Notes he has been dealing with this for  years. Feels like the bottom chamber of his heart beats too early. Endorses feeling fatigued, dyspneic. Episodes last from minutes to hours and are worsening. Using albuterol inhaler occasional with minimal relief. He is due for PFTs with pulmonology. No formal exercise routine but stays active. 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 Does note recent stressors related to his military history and some bad memories, recently [facility] by [facility] but cannot recall the name.   EKGs/Labs/Other  Studies Reviewed:   The following studies were reviewed today:  Echo 09/29/2020:  EF > 75%, Hyperdynamic. No RWMA. Mild LVH. ~ DD with mild LA dilation. Mild AI. Normal RV size & fxn, ? Dilated IVC c/w CVP ~ 15 mHg.   R&LHC-PCI 10/11/2020: Patent DES in mRCA & p-mLAD. CULPRIT = m-d LAD 70% (just after D1 w/ Ost 60%) FFR 0.75 =>  DES PCI m-dLAD & PTCA ost D1 (overlapping prior stent distally & crossing D2) SYNERGY DES 2.5 x 32 -> tapered post-dilation 3.7 to 2.6 (reduced to 0% residual), Ost D2 post PTCA 40%.   RIGHT HEART CATH NUMBERS ALONG WITH CARDIAC OUTPUT AND INDEX ARE NORMAL. NO PULMONARY HYPERTENSION Diagnostic                                                   Intervention   Right Heart Pressures PAP-mean: 29/10 mmHg - 18 mmHg PCWP 13 mmHg  AoP 127/67 mmHg- MAP 89 mmH Ao sat 100%, PA sat 75%. Cardiac Output/Index by Fick: 7.68/3.34.  Right Atrium RAP mean 5 mmHg  Right Ventricle RVP-EDP 30/1 mmHg - 6 mmHg       EKG:  No EKG today. EKG independently reviewed from 01/25/21 with SR 72 bpm with occasional PAC. There were no acute ST/T wave chnages.   Recent Labs: 09/12/2020: Pro B Natriuretic peptide (BNP) 82.0 10/05/2020: ALT 26; BUN 12; Creatinine, Ser 0.96; Platelets 242 10/11/2020: Hemoglobin 11.6; Hemoglobin 11.9; Potassium 3.6; Potassium 3.8; Sodium 143; Sodium 142  Recent Lipid Panel    Component Value Date/Time    CHOL 165 10/05/2020 1010   TRIG 259 (H) 10/05/2020 1010   HDL 53 10/05/2020 1010   CHOLHDL 3.1 10/05/2020 1010   CHOLHDL 9.5 08/17/2009 0444   VLDL UNABLE TO CALCULATE IF TRIGLYCERIDE OVER 400 mg/dL 08/17/2009 0444   LDLCALC 70 10/05/2020 1010    Risk Assessment/Calculations:   CHA2DS2-VASc Score = 5   This indicates a 7.2% annual risk of stroke. The patient's score is based upon: CHF History: 0 HTN History: 1 Diabetes History: 1 Stroke History: 0 Vascular Disease History: 1 Age Score: 2 Gender Score: 0   Home Medications   Current Meds  Medication Sig   acetaminophen (TYLENOL) 500 MG tablet Take 1,000 mg by mouth every 6 (six) hours as needed for mild pain or moderate pain.   Adalimumab 40 MG/0.4ML PSKT Inject 40 mg into the skin every 14 (fourteen) days.    albuterol (VENTOLIN HFA) 108 (90 Base) MCG/ACT inhaler Inhale 2 puffs into the lungs every 6 (six) hours as needed for wheezing or shortness of breath.   apixaban (ELIQUIS) 5 MG TABS tablet Take 1 tablet (5 mg total) by mouth 2 (two) times daily.   calcipotriene (DOVONOX) 0.005 % cream Apply 1 application topically 2 (two) times daily as needed (psoriasis).   Calcium Carbonate-Vit D-Min (CALCIUM 1200 PO) Take 1,200 mg by mouth daily.   cetirizine (ZYRTEC) 10 MG tablet Take 10 mg by mouth daily.   clobetasol cream (TEMOVATE) 6.16 % Apply 1 application topically 2 (two) times daily as needed (for psoriasis).   clopidogrel (PLAVIX) 75 MG tablet Take 1 tablet (75 mg total) by mouth daily.   diltiazem (CARTIA XT) 240 MG 24 hr capsule Take 1 capsule (240 mg total) by mouth daily.   ferrous sulfate 325 (65 FE) MG  tablet Take 325 mg by mouth daily.   gabapentin (NEURONTIN) 300 MG capsule Take 300-600 mg by mouth See admin instructions. Take 300 mg in the morning and 600 mg in th evening   galantamine (RAZADYNE ER) 8 MG 24 hr capsule Take 16 mg by mouth daily with breakfast.   HYDROcodone-acetaminophen (NORCO/VICODIN) 5-325 MG  tablet Take 1-2 tablets by mouth every 6 (six) hours as needed.   memantine (NAMENDA) 10 MG tablet Take 20 mg by mouth at bedtime.   metFORMIN (GLUCOPHAGE-XR) 500 MG 24 hr tablet Take 500 mg by mouth at bedtime.   Polyethyl Glycol-Propyl Glycol (SYSTANE OP) Place 1 drop into both eyes daily as needed (Dry eye).   prazosin (MINIPRESS) 1 MG capsule TAKE ONE CAPSULE BY MOUTH AT BEDTIME FOR 1 MONTH, THEN TAKE TWO CAPSULES AT BEDTIME FOR 1 MONTH, THEN TAKE THREE CAPSULES AT BEDTIME - NEW, TO REDUCE NIGHTMARES   predniSONE (DELTASONE) 5 MG tablet Take 1 tablet by mouth once daily with breakfast   tamsulosin (FLOMAX) 0.4 MG CAPS capsule Take 0.4 mg by mouth daily.     Review of Systems      All other systems reviewed and are otherwise negative except as noted above.  Physical Exam    VS:  BP 124/80   Pulse 82   Ht 6\' 3"  (1.905 m)   Wt 234 lb 14.4 oz (106.5 kg)   SpO2 98%   BMI 29.36 kg/m  , BMI Body mass index is 29.36 kg/m.  Wt Readings from Last 3 Encounters:  01/31/21 234 lb 14.4 oz (106.5 kg)  01/25/21 229 lb (103.9 kg)  01/08/21 238 lb 6.4 oz (108.1 kg)     GEN: Well nourished, well developed, in no acute distress. HEENT: normal. Neck: Supple, no JVD, carotid bruits, or masses. Cardiac: RRR, no  rubs, or gallops. Gr 3/6 systolic murmur, No clubbing, cyanosis, edema.  Radials/PT 2+ and equal bilaterally.  Respiratory:  Respirations regular and unlabored, clear to auscultation bilaterally. GI: Soft, nontender, nondistended. MS: No deformity or atrophy. Skin: Warm and dry, no rash. Neuro:  Strength and sensation are intact. Psych: Normal affect.  Assessment & Plan    PAF / palpitations / Chronic anticoagulation - SR by auscultation today. EKG 01/25/21 at atrial fib clinic with NSR with PAC and no acute ST/T wave changes. No BB due to previous fatigue. Continue current dose Diltiazem. 7 day ZIO monitor placed in clinic given reported worsening palpitations to assess PVC/PAC  frequency. Continue Eliquis 5mg  BID. CHA2DS2-VASc Score = 5 [CHF History: 0, HTN History: 1, Diabetes History: 1, Stroke History: 0, Vascular Disease History: 1, Age Score: 2, Gender Score: 0].  Therefore, the patient's annual risk of stroke is 7.2 %.    Denies bleeding complications. CBC, BMP, TSH today to rule out anemia, electrolyte or thyroid abnormality.  CAD - New exertional dyspnea concerning for angina. Reports symptoms are similar to his anginal equivalent and worsening. S/p DES 10/2020. Plan for lexiscan myoview. GDMT includes Plavix, Rosuvastatin. No BB due to previous fatigue. Heart healthy diet and regular cardiovascular exercise encouraged.    Shared Decision Making/Informed Consent The risks [chest pain, shortness of breath, cardiac arrhythmias, dizziness, blood pressure fluctuations, myocardial infarction, stroke/transient ischemic attack, nausea, vomiting, allergic reaction, radiation exposure, metallic taste sensation and life-threatening complications (estimated to be 1 in 10,000)], benefits (risk stratification, diagnosing coronary artery disease, treatment guidance) and alternatives of a nuclear stress test were discussed in detail with Mr. Dery and he agrees to  proceed.   ILD - Recommend overdue follow up with pulmonology for PFT. Likely contributory to exertional dypsnea.   Diastolic dysfunction - Euvolemic on exam. LHC 10/2020 normal LVEDP, no pulmonary hypertension. Continue low salt diet. No indication for diuretic at this time.   HLD - Continue Rosuvastatin.   HTN - BP well controlled. Continue current antihypertensive regimen.    Disposition: Follow up in 2 month(s) with Dr. Ellyn Hack or APP.  Signed, Loel Dubonnet, NP 01/31/2021, 10:09 AM [place]

## 2021-01-31 NOTE — Patient Instructions (Signed)
Medication Instructions:  Your Physician recommend you continue on your current medication as directed.    *If you need a refill on your cardiac medications before your next appointment, please call your pharmacy*   Lab Work: TSH, CBC, and BMP today on the 3rd floor  If you have labs (blood work) drawn today and your tests are completely normal, you will receive your results only by: [place] (if you have MyChart) OR A paper copy in the mail If you have any lab test that is abnormal or we need to change your treatment, we will call you to review the results.   Testing/Procedures:   Your physician has recommended that you wear a Zio monitor.   This monitor is a medical device that records the heart's electrical activity. Doctors most often use these monitors to diagnose arrhythmias. Arrhythmias are problems with the speed or rhythm of the heartbeat. The monitor is a small device applied to your chest. You can wear one while you do your normal daily activities. While wearing this monitor if you have any symptoms to push the button and record what you felt. Once you have worn this monitor for the period of time provider prescribed (Usually 14 days), you will return the monitor device in the postage paid box. Once it is returned they will download the data collected and provide [place] with a report which the provider will then review and we will call you with those results. Important tips:  Avoid showering during the first 24 hours of wearing the monitor. Avoid excessive sweating to help maximize wear time. Do not submerge the device, no hot tubs, and no swimming pools. Keep any lotions or oils away from the patch. After 24 hours you may shower with the patch on. Take brief showers with your back facing the shower head.  Do not remove patch once it has been placed because that will interrupt data and decrease adhesive wear time. Push the button when you have any symptoms and write down what  you were feeling. Once you have completed wearing your monitor, remove and place into box which has postage paid and place in your outgoing mailbox.  If for some reason you have misplaced your box then call our office and we can provide another box and/or mail it off for you.  Your physician has requested that you have a lexiscan myoview. For further information please visit [email or web address]. Please follow instruction sheet, as given.  [place] [address]   Follow-Up: At [facility], you and your health needs are our priority.  As part of our continuing mission to provide you with exceptional heart care, we have created designated Provider Care Teams.  These Care Teams include your primary Cardiologist (physician) and Advanced Practice Providers (APPs -  Physician Assistants and Nurse Practitioners) who all work together to provide you with the care you need, when you need it.  We recommend signing up for the patient portal called "MyChart".  Sign up information is provided on this After Visit Summary.  MyChart is used to connect with patients for Virtual Visits (Telemedicine).  Patients are able to view lab/test results, encounter notes, upcoming appointments, etc.  Non-urgent messages can be sent to your provider as well.   To learn more about what you can do with MyChart, go to [email or web address].    Your next appointment:   2 month(s)  The format for your next appointment:   In Person  Provider:   You may  see [name], MD or one of the following Advanced Practice Providers on your designated Care Team:   Rosaria Ferries, PA-C Caron Presume, PA-C Jory Sims, DNP, ANP Laurann [place], FNP   Other Instructions Recommend reaching out to Dr. Elsworth Soho of pulmonology to complete pulmonary function tests.   You are scheduled for a Myocardial Perfusion Imaging Study. Please arrive 15 minutes prior to your appointment time for registration and insurance  purposes.  The test will take approximately 3 to 4 hours to complete; you may bring reading material.  If someone comes with you to your appointment, they will need to remain in the main lobby due to limited space in the testing area. **If you are pregnant or breastfeeding, please notify the nuclear lab prior to your appointment**  How to prepare for your Myocardial Perfusion Test: Do not eat or drink 3 hours prior to your test, except you may have water. Do not consume products containing caffeine (regular or decaffeinated) 12 hours prior to your test. (ex: coffee, chocolate, sodas, tea). Do bring a list of your current medications with you.  If not listed below, you may take your medications as normal. Do wear comfortable clothes (no dresses or overalls) and walking shoes, tennis shoes preferred (No heels or open toe shoes are allowed). Do NOT wear cologne, perfume, aftershave, or lotions (deodorant is allowed). If these instructions are not followed, your test will have to be rescheduled.  Please report to [address] for your test.  If you have questions or concerns about your appointment, you can call the Nuclear Lab at [phone].  If you cannot keep your appointment, please provide 24 hours notification to the Nuclear Lab, to avoid a possible $50 charge to your account.

## 2021-02-01 ENCOUNTER — Ambulatory Visit (INDEPENDENT_AMBULATORY_CARE_PROVIDER_SITE_OTHER): Payer: Medicare Other | Admitting: Adult Health

## 2021-02-01 ENCOUNTER — Other Ambulatory Visit: Payer: Self-pay

## 2021-02-01 ENCOUNTER — Encounter: Payer: Self-pay | Admitting: Adult Health

## 2021-02-01 ENCOUNTER — Emergency Department (HOSPITAL_COMMUNITY)
Admission: EM | Admit: 2021-02-01 | Discharge: 2021-02-01 | Discharge status: Against Medical Advice | Payer: Medicare Other | Attending: Emergency Medicine | Admitting: Emergency Medicine

## 2021-02-01 ENCOUNTER — Telehealth: Payer: Self-pay | Admitting: Cardiology

## 2021-02-01 ENCOUNTER — Encounter (HOSPITAL_COMMUNITY): Payer: Self-pay

## 2021-02-01 ENCOUNTER — Ambulatory Visit (INDEPENDENT_AMBULATORY_CARE_PROVIDER_SITE_OTHER): Payer: Medicare Other

## 2021-02-01 ENCOUNTER — Telehealth: Payer: Self-pay

## 2021-02-01 VITALS — BP 124/68 | HR 81 | Temp 97.6°F | Ht 75.0 in | Wt 229.0 lb

## 2021-02-01 DIAGNOSIS — I471 Supraventricular tachycardia: Secondary | ICD-10-CM

## 2021-02-01 DIAGNOSIS — I25119 Atherosclerotic heart disease of native coronary artery with unspecified angina pectoris: Secondary | ICD-10-CM

## 2021-02-01 DIAGNOSIS — Z5321 Procedure and treatment not carried out due to patient leaving prior to being seen by health care provider: Secondary | ICD-10-CM | POA: Diagnosis not present

## 2021-02-01 DIAGNOSIS — I2510 Atherosclerotic heart disease of native coronary artery without angina pectoris: Secondary | ICD-10-CM | POA: Diagnosis not present

## 2021-02-01 DIAGNOSIS — R9431 Abnormal electrocardiogram [ECG] [EKG]: Secondary | ICD-10-CM | POA: Insufficient documentation

## 2021-02-01 DIAGNOSIS — R0789 Other chest pain: Secondary | ICD-10-CM | POA: Diagnosis not present

## 2021-02-01 DIAGNOSIS — R0602 Shortness of breath: Secondary | ICD-10-CM | POA: Diagnosis not present

## 2021-02-01 DIAGNOSIS — J849 Interstitial pulmonary disease, unspecified: Secondary | ICD-10-CM

## 2021-02-01 DIAGNOSIS — I700 Atherosclerosis of aorta: Secondary | ICD-10-CM | POA: Diagnosis not present

## 2021-02-01 LAB — CBC WITH DIFFERENTIAL/PLATELET
Abs Immature Granulocytes: 0.02 10*3/uL (ref 0.00–0.07)
Basophils Absolute: 0 10*3/uL (ref 0.0–0.1)
Basophils Relative: 1 %
Eosinophils Absolute: 0.1 10*3/uL (ref 0.0–0.5)
Eosinophils Relative: 1 %
HCT: 34.4 % — ABNORMAL LOW (ref 39.0–52.0)
Hemoglobin: 11 g/dL — ABNORMAL LOW (ref 13.0–17.0)
Immature Granulocytes: 0 %
Lymphocytes Relative: 14 %
Lymphs Abs: 1.2 10*3/uL (ref 0.7–4.0)
MCH: 29.2 pg (ref 26.0–34.0)
MCHC: 32 g/dL (ref 30.0–36.0)
MCV: 91.2 fL (ref 80.0–100.0)
Monocytes Absolute: 0.7 10*3/uL (ref 0.1–1.0)
Monocytes Relative: 8 %
Neutro Abs: 6.8 10*3/uL (ref 1.7–7.7)
Neutrophils Relative %: 76 %
Platelets: 219 10*3/uL (ref 150–400)
RBC: 3.77 MIL/uL — ABNORMAL LOW (ref 4.22–5.81)
RDW: 13.7 % (ref 11.5–15.5)
WBC: 8.9 10*3/uL (ref 4.0–10.5)
nRBC: 0 % (ref 0.0–0.2)

## 2021-02-01 LAB — BASIC METABOLIC PANEL
Anion gap: 8 (ref 5–15)
BUN/Creatinine Ratio: 15 (ref 10–24)
BUN: 14 mg/dL (ref 8–27)
BUN: 14 mg/dL (ref 8–23)
CO2: 23 mmol/L (ref 20–29)
CO2: 24 mmol/L (ref 22–32)
Calcium: 9.7 mg/dL (ref 8.6–10.2)
Calcium: 8.8 mg/dL — ABNORMAL LOW (ref 8.9–10.3)
Chloride: 101 mmol/L (ref 96–106)
Chloride: 104 mmol/L (ref 98–111)
Creatinine, Ser: 0.95 mg/dL (ref 0.76–1.27)
Creatinine, Ser: 0.99 mg/dL (ref 0.61–1.24)
GFR, Estimated: >60 mL/min (ref >60)
Glucose, Bld: 181 mg/dL — ABNORMAL HIGH (ref 70–99)
Glucose: 159 mg/dL — ABNORMAL HIGH (ref 70–99)
Potassium: 4.3 mmol/L (ref 3.5–5.2)
Potassium: 4.1 mmol/L (ref 3.5–5.1)
Sodium: 139 mmol/L (ref 134–144)
Sodium: 136 mmol/L (ref 135–145)
eGFR: 83 mL/min/{1.73_m2} (ref >59)

## 2021-02-01 LAB — BRAIN NATRIURETIC PEPTIDE: B Natriuretic Peptide: 19.3 pg/mL (ref 0.0–100.0)

## 2021-02-01 LAB — CBC
Hematocrit: 35.6 % — ABNORMAL LOW (ref 37.5–51.0)
Hemoglobin: 11.6 g/dL — ABNORMAL LOW (ref 13.0–17.7)
MCH: 29.3 pg (ref 26.6–33.0)
MCHC: 32.6 g/dL (ref 31.5–35.7)
MCV: 90 fL (ref 79–97)
Platelets: 218 10*3/uL (ref 150–450)
RBC: 3.96 x10E6/uL — ABNORMAL LOW (ref 4.14–5.80)
RDW: 13.7 % (ref 11.6–15.4)
WBC: 9.3 10*3/uL (ref 3.4–10.8)

## 2021-02-01 LAB — TROPONIN I (HIGH SENSITIVITY): Troponin I (High Sensitivity): 5 ng/L (ref <18)

## 2021-02-01 LAB — TSH: TSH: 1.36 u[IU]/mL (ref 0.450–4.500)

## 2021-02-01 MED ORDER — ALBUTEROL SULFATE HFA 108 (90 BASE) MCG/ACT IN AERS: 1.0000 | INHALATION_SPRAY | Freq: Four times a day (QID) | RESPIRATORY_TRACT | 2 refills | Status: DC | PRN

## 2021-02-01 MED ORDER — PREDNISONE 10 MG PO TABS: 10.0000 mg | ORAL_TABLET | Freq: Every day | ORAL | 0 refills | Status: DC

## 2021-02-01 NOTE — ED Provider Notes (Addendum)
Emergency Medicine Provider Triage Evaluation Note  Kenneth [name] , a 75 y.o. male  was evaluated in triage.  Pt complains of chest tightness, shortness of breath.  Was seen by cardiology yesterday.  Placed on monitor.  His Lexiscan planned for 1 week Apparently told cardiology this was similar to when he needed stents placed.  Did have recent stents placed this summer.  He was seen by pulmonary today and had some chest tightness and increased work of breathing.  He subsequently recommended patient come here for further evaluation.  Patient does state he intermittently has some chest tightness, dyspnea on exertion.  No lower extremity edema.  He is chronically anticoagulated on Eliquis. Has not missed any doses.  No back pain, abdominal pain, lower extremity edema, PND, orthopnea  Review of Systems  Positive: Chest tightness, shortness of breath Negative:   Physical Exam  BP (!) 161/74 (BP Location: Left Arm)   Pulse 90   Temp 98.4 F (36.9 C) (Oral)   Resp 19   SpO2 97%  Gen:   Awake, no distress   Resp:  Normal effort  MSK:   Moves extremities without difficulty, trace edema to lower extremities Other:    Medical Decision Making  Medically screening exam initiated at 7:07 PM.  Appropriate orders placed.  Kenneth [name] was informed that the remainder of the evaluation will be completed by another provider, this initial triage assessment does not replace that evaluation, and the importance of remaining in the ED until their evaluation is complete.  Chest pain, shortness of breath, history of heart failure, chronic anticoagulated.  Labs imaging ordered  Chest x-ray done at pulmonology shows interstitial lung changes however no acute infection.  Results in epic       Nettie Elm, PA-C 02/01/21 1911    [name], MD 02/01/21 2000

## 2021-02-01 NOTE — Telephone Encounter (Signed)
Returned call to wife (ok per DPR)-she states patient is unsure why he needed to go to the ER.      Per chart review:  seen by Laurann Montana NP 10/26 CAD - New exertional dyspnea concerning for angina. Reports symptoms are similar to his anginal equivalent and worsening. S/p DES 10/2020. Plan for lexiscan myoview. GDMT includes Plavix, Rosuvastatin. No BB due to previous fatigue. Heart healthy diet and regular cardiovascular exercise encouraged.    Saw pulmonary today:  Known history of coronary artery disease-patient with significant shortness of breath and chest tightness.  Suspicious for possible underlying angina.  Patient has been seen by cardiology and has an upcoming cardiac stress test tomorrow.  EKG in the office shows no acute changes with nonspecific ST changes.  Despite this I recommend patient be seen in the emergency room for further evaluation as he had acute chest tightness during exam today and very short of breath. Patient adamantly refuses and would not go to the emergency room or be transported by EMS.  He also would not allow his family to be contacted.  Late add : obtained copies of previous EKG April and July 2022 with notable changes in today's EKG in V2-V3 lead with ST changes.  Called patient again and encouraged him to go to ER via EMS for evaluation . Patient agrees will go for evaluation .    States patient has refused to go up to this point and she is unsure what to do.  Advised would take pulmonary recommendations and proceed to ER for evaluation as this is the only way to evaluate for acute issues.  She will encourage patient to go.

## 2021-02-01 NOTE — ED Notes (Signed)
Pt states they are leaving  

## 2021-02-01 NOTE — Progress Notes (Signed)
@Patient  ID: Kenneth [name], male    DOB: 1946-02-12, 75 y.o.   MRN: [id number]  Chief Complaint  Patient presents with   Follow-up    Referring provider: [facility], [facility]  HPI: 75 year old male former smoker followed for steroid responsive interstitial lung disease, probable NSIP. Medical history significant for psoriatic arthritis previously on Enbrel, methotrexate, Humira.  Now on Adalimumab. , A Fib and CAD s/;p stents    TEST/EVENTS :  CT angiogram 09/2007 which shows bilateral groundglass opacities but a repeat CT in 10/2007 and shows resolution of these infiltrates.  01/27/2014 HRCT - patchy areas of very mild ground-glass attenuation and subpleural reticulation.  CHest HRCT 04/2016 >> slight worse , NSIP pattern HRCT 05/2017 unchanged NSIP, 2-4 mm new  benign nodules   HRCT 10/2019 >> likely NSIP, unchanged from 2019     PFT 04/2015 >> ratio nml, FVC 61%, TLC 68%, DLCO 67%      11/15/2013-echocardiogram-LV ejection fraction 65 to 70%, mild concentric hypertrophy, PAP pressure 35  High-resolution CT chest July 03, 2020 interstitial lung disease (interstitial coarsening and groundglass with traction bronchiectasis and subpleural reticular densities) unchanged from July 2021.  Findings suggestive of an alternative diagnosis not UIP  02/01/2021 Follow up : ILD /NSIP  Patient returns for a 29-month follow-up. Patient complains that breathing has not been doing as well over last few weeks.  Very winded with minimal activity and rest, and decreased activity tolerance . No increased cough or congestion . O2 sats today 98% on room air , walk test with no desats with O2 sats 98-99% with ambulation on room air.  Patient following with cardiology , has card monitor and scheduled for cardiac stress test tomorrow.  Says he has episodes of severe dyspnea especially with activity. Breath gives out easily . During exam today patient with mid sternal /epigastric pain /pressure . EKG with  NSR , nonspecific ST changes. Patient with no n/v/d. Feels cold and clammy .    Allergies  Allergen Reactions   Hydrocodone Shortness Of Breath    In combination with decongestants   Other Palpitations and Other (See Comments)    ALL DECONGESTANTS - CAUSE PALPITATIONS; THROWS HEART RHYTHM OUT OF BALANCE   Oxycodone Shortness Of Breath   Pseudoephedrine Palpitations   Tramadol Shortness Of Breath and Rash    flushing   Gemfibrozil Other (See Comments)    Dizziness      Immunization History  Administered Date(s) Administered   Fluad Quad(high Dose 65+) 11/30/2018, 02/01/2020   H1N1 03/18/2008   Influenza Split 03/27/2011, 01/17/2017   Influenza Whole 02/24/2008, 01/12/2009   Influenza, High Dose Seasonal PF 01/03/2014, 01/23/2015, 01/15/2016, 12/30/2016, 01/06/2018, 01/26/2018   Influenza,inj,Quad PF,6+ Mos 01/03/2014   Influenza-Unspecified 01/28/2012, 02/08/2013, 01/23/2015, 01/21/2019   Moderna Sars-Covid-2 Vaccination 06/03/2019, 07/01/2019, 12/09/2019, 07/18/2020   Pneumococcal Conjugate-13 07/05/2013   Pneumococcal Polysaccharide-23 11/30/2018, 02/28/2020   Pneumococcal-Unspecified 01/28/2012, 01/15/2016   Tdap 06/08/2012, 02/01/2020   Zoster Recombinat (Shingrix) 11/15/2020    Past Medical History:  Diagnosis Date   Arthritis    CAD S/P percutaneous coronary angioplasty 06/28/2016   Cardiologist- Dr. Ellyn Hack: 06/28/16 -- [facility] PCI (Synergy DES 2.75 x 20 mm); 07/10/16: Staged p-m LAD PCI (FFR 0.75) - DES PCI (Synergy DES 3.5 x 20 mm); 10/11/2020: mid-distal LAD 70% (FFR 0.75) DES PCI (Synergy DES 2.5 mm x 32 mm - overlaps prox-mid stent, tapered post-dilation 3.7 - 2.6 mm.   Chronic diastolic heart failure ([place])    (Cardiologist: Dr. Ellyn Hack): EF >  75% / Hyperdynamic LV Fxn - indeterminate Diastolic Fxn.   Chronic dryness of both eyes    Diverticulosis of colon    Epiretinal membrane    "epiretinal attachment" (06/28/2016) 10-14-2017 per pt oringinally in both , now only  unilateral    GERD (gastroesophageal reflux disease)    Hiatal hernia    History of adenomatous polyp of colon    History of concussion 08/23/2017   w/o loc--- per pt no residual   History of kidney stones    History of rheumatic fever 1958   NO Evidence of Vavlular disease -> only mild Aortic Sclerosis   History of skin cancer    excision leg;  froze the left arm--- unsure BCC or SCC    Hyperlipidemia    Hypertension    ILD (interstitial lung disease) ([place])    pulmologist-  dr Elsworth Soho--  secondary to methotrexate use --- last PFTs 04-25-2015  mild restriction   Iron deficiency anemia    Mild obstructive sleep apnea    10-04-2019 per pt  had a sleep study years ago , was told no cpap recommended pt denies    Nephrolithiasis    CT 09-07-2019 bilateral nonobstructive stones   Paroxysmal atrial fibrillation [facility]) cardiologist-  dr harding/  EP-- dr Rayann Heman   a. 11/2003 Tikosyn initiated - subsequently d/c'd;  b. 2006 RFCA for Afib @ [facility];  c. 09/2011 Echo: EF 60-65%, Gr 2 DD;  d. DCCV 10/2011 , 04/2012, 12/ 2014, & 02/ 2015;  e. RFCA 01-13-2014   Plaque psoriasis 1969   followed by rheumatologist  and dermatologist @ [facility] in [place]   Right ureteral stone    S/P drug eluting coronary stent placement    06-28-2016  x1 to [place];  07-10-2016 x1 to mLAD   Seasonal allergies    Type 2 diabetes mellitus ([place])    followed by pcp   Ureteral calculus, right    Wears hearing aid in both ears     Tobacco History: Social History   Tobacco Use  Smoking Status Former   Types: Cigars   Quit date: 09/29/1997   Years since quitting: 23.3  Smokeless Tobacco Former   Types: Snuff, Chew  Tobacco Comments   Form smoker and chew tobacco 01/25/2021   Counseling given: Not Answered Tobacco comments: Form smoker and chew tobacco 01/25/2021   Outpatient Medications Prior to Visit  Medication Sig Dispense Refill   acetaminophen (TYLENOL) 500 MG tablet Take 1,000 mg by mouth every 6 (six) hours as  needed for mild pain or moderate pain.     Adalimumab 40 MG/0.4ML PSKT Inject 40 mg into the skin every 14 (fourteen) days.      apixaban (ELIQUIS) 5 MG TABS tablet Take 1 tablet (5 mg total) by mouth 2 (two) times daily. 60 tablet 3   calcipotriene (DOVONOX) 0.005 % cream Apply 1 application topically 2 (two) times daily as needed (psoriasis).     Calcium Carbonate-Vit D-Min (CALCIUM 1200 PO) Take 1,200 mg by mouth daily.     cetirizine (ZYRTEC) 10 MG tablet Take 10 mg by mouth daily.     clobetasol cream (TEMOVATE) 6.94 % Apply 1 application topically 2 (two) times daily as needed (for psoriasis).     clopidogrel (PLAVIX) 75 MG tablet Take 1 tablet (75 mg total) by mouth daily. 90 tablet 2   diltiazem (CARTIA XT) 240 MG 24 hr capsule Take 1 capsule (240 mg total) by mouth daily. 30 capsule 3   ferrous sulfate  325 (65 FE) MG tablet Take 325 mg by mouth daily.     gabapentin (NEURONTIN) 300 MG capsule Take 300-600 mg by mouth See admin instructions. Take 300 mg in the morning and 600 mg in th evening     galantamine (RAZADYNE ER) 8 MG 24 hr capsule Take 16 mg by mouth daily with breakfast.     HYDROcodone-acetaminophen (NORCO/VICODIN) 5-325 MG tablet Take 1-2 tablets by mouth every 6 (six) hours as needed.     memantine (NAMENDA) 10 MG tablet Take 20 mg by mouth at bedtime.     metFORMIN (GLUCOPHAGE-XR) 500 MG 24 hr tablet Take 500 mg by mouth at bedtime.     Polyethyl Glycol-Propyl Glycol (SYSTANE OP) Place 1 drop into both eyes daily as needed (Dry eye).     prazosin (MINIPRESS) 1 MG capsule TAKE ONE CAPSULE BY MOUTH AT BEDTIME FOR 1 MONTH, THEN TAKE TWO CAPSULES AT BEDTIME FOR 1 MONTH, THEN TAKE THREE CAPSULES AT BEDTIME - NEW, TO REDUCE NIGHTMARES     predniSONE (DELTASONE) 5 MG tablet Take 1 tablet by mouth once daily with breakfast 30 tablet 3   tamsulosin (FLOMAX) 0.4 MG CAPS capsule Take 0.4 mg by mouth daily.     albuterol (VENTOLIN HFA) 108 (90 Base) MCG/ACT inhaler Inhale 2 puffs into  the lungs every 6 (six) hours as needed for wheezing or shortness of breath. 8 g 6   rosuvastatin (CRESTOR) 20 MG tablet Take 1 tablet (20 mg total) by mouth every evening. 90 tablet 3   Facility-Administered Medications Prior to Visit  Medication Dose Route Frequency Provider Last Rate Last Admin   lidocaine (cardiac) 100 mg/49ml (XYLOCAINE) 20 MG/ML injection 2%    Anesthesia Intra-op Flowers, Rokoshi T, CRNA   60 mg at 03/25/13 1617   propofol (DIPRIVAN) 10 mg/mL bolus/IV push    Anesthesia Intra-op Flowers, Rokoshi T, CRNA   90 mg at 03/25/13 1617     Review of Systems:   Constitutional:   No  weight loss, night sweats,  Fevers, chills,  +fatigue, or  lassitude.  HEENT:   No headaches,  Difficulty swallowing,  Tooth/dental problems, or  Sore throat,                No sneezing, itching, ear ache, nasal congestion, post nasal drip,   CV:  No Orthopnea, PND, swelling in lower extremities, anasarca, dizziness, palpitations, syncope.   GI  No heartburn, indigestion, abdominal pain, nausea, vomiting, diarrhea, change in bowel habits, loss of appetite, bloody stools.   Resp:   No chest wall deformity  Skin: no rash or lesions.  GU: no dysuria, change in color of urine, no urgency or frequency.  No flank pain, no hematuria   MS:  No joint pain or swelling.  No decreased range of motion.  No back pain.    Physical Exam  BP 124/68 (BP Location: Left Arm, Patient Position: Sitting, Cuff Size: Normal)   Pulse 81   Temp 97.6 F (36.4 C) (Oral)   Ht 6\' 3"  (1.905 m)   Wt 229 lb (103.9 kg)   SpO2 98%   BMI 28.62 kg/m   GEN: A/Ox3; pleasant , well nourished    HEENT:  [place]/AT,  NOSE-clear, THROAT-clear, no lesions, no postnasal drip or exudate noted.   NECK:  Supple w/ fair ROM; no JVD; normal carotid impulses w/o bruits; no thyromegaly or nodules palpated; no lymphadenopathy.    RESP  BB crackles ,  no accessory muscle use, no  dullness to percussion  CARD:  RRR, Grade 2/6SM , tr   peripheral edema, pulses intact, no cyanosis or clubbing.  GI:   Soft & nt; nml bowel sounds; no organomegaly or masses detected.   Musco: Warm bil, no deformities or joint swelling noted.   Neuro: alert, no focal deficits noted.    Skin: Warm, no lesions or rashes    Lab Results:      BNP No results found for: BNP    Imaging: DG Chest 2 View  Result Date: 02/01/2021 CLINICAL DATA:  Interstitial lung disease EXAM: CHEST - 2 VIEW COMPARISON:  06/05/2018, CT 07/01/2020 FINDINGS: Bilateral reticular interstitial opacity consistent with chronic lung disease and fibrosis. No acute consolidation, pleural effusion or pneumothorax. Stable cardiomediastinal silhouette with aortic atherosclerosis. Electronic device over left upper chest wall. IMPRESSION: Chronic interstitial lung disease without acute airspace disease. Electronically Signed   By: Donavan Foil M.D.   On: 02/01/2021 16:02      PFT Results Latest Ref Rng & Units 04/25/2015  FVC-Pre L 3.07  FVC-Predicted Pre % 60  FVC-Post L 3.10  FVC-Predicted Post % 61  Pre FEV1/FVC % % 83  Post FEV1/FCV % % 83  FEV1-Pre L 2.54  FEV1-Predicted Pre % 68  FEV1-Post L 2.58  DLCO uncorrected ml/min/mmHg 25.18  DLCO UNC% % 67  DLVA Predicted % 106  TLC L 5.26  TLC % Predicted % 68  RV % Predicted % 27    No results found for: NITRICOXIDE      Assessment & Plan:   ILD (interstitial lung disease) ([place]) Possible flare -patient with significant shortness of breath decreased activity tolerance and associated chest tightness.  I have recommended that patient be transported to the emergency room for further evaluation.  Patient is very adamant that he is not going despite long discussion regarding the potential complications of underlying cardiac or pulmonary acute illness.  I believe patient needs further evaluation at a higher level of medical care.  Recommended EMS transport to the emergency room.  Also recommended contacting his  family, granddaughter to discuss this and help with taking care of his wife and or helping get him to the emergency room is since he declines EMS.  He is adamant and refuses that we call any of his family members.  Patient became very angry and adamantly refused to go to the emergency room We did call patient after he left our office he did arrive at home.  Have recommended that he discuss with his family members and be taken to the emergency room for further evaluation Plan  Patient Instructions  I recommend you go to the emergency room by EMS - for evaluation. We discussed the dangers of driving.  Chest xray today .  Increase Prednisone 10mg  daily for 2 weeks and then 5mg  daily.  Albuterol inhaler 1-2 puffs every 4-6hrs as needed  Follow up with Cardiology as planned tomorrow  Follow up with Dr. Elsworth Soho in 4 weeks and As needed   Please contact office for sooner follow up if symptoms do not improve or worsen or seek emergency care      Check chest xray today   Coronary artery disease involving native coronary artery with angina pectoris ([place]) Known history of coronary artery disease-patient with significant shortness of breath and chest tightness.  Suspicious for possible underlying angina.  Patient has been seen by cardiology and has an upcoming cardiac stress test tomorrow.  EKG in the office shows no acute changes  with nonspecific ST changes.  Despite this I recommend patient be seen in the emergency room for further evaluation as he had acute chest tightness during exam today and very short of breath. Patient adamantly refuses and would not go to the emergency room or be transported by EMS.  He also would not allow his family to be contacted.  Plan  Patient Instructions  I recommend you go to the emergency room by EMS - for evaluation. We discussed the dangers of driving.  Chest xray today .  Increase Prednisone 10mg  daily for 2 weeks and then 5mg  daily.  Albuterol inhaler 1-2 puffs every  4-6hrs as needed  Follow up with Cardiology as planned tomorrow  Follow up with Dr. Elsworth Soho in 4 weeks and As needed   Please contact office for sooner follow up if symptoms do not improve or worsen or seek emergency care       I spent   43 minutes dedicated to the care of this patient on the date of this encounter to include pre-visit review of records, face-to-face time with the patient discussing conditions above, post visit ordering of testing, clinical documentation with the electronic health record, making appropriate referrals as documented, and communicating necessary findings to members of the patients care team.    Rexene Edison, NP 02/01/2021

## 2021-02-01 NOTE — Assessment & Plan Note (Signed)
Possible flare -patient with significant shortness of breath decreased activity tolerance and associated chest tightness.  I have recommended that patient be transported to the emergency room for further evaluation.  Patient is very adamant that he is not going despite long discussion regarding the potential complications of underlying cardiac or pulmonary acute illness.  I believe patient needs further evaluation at a higher level of medical care.  Recommended EMS transport to the emergency room.  Also recommended contacting his family, granddaughter to discuss this and help with taking care of his wife and or helping get him to the emergency room is since he declines EMS.  He is adamant and refuses that we call any of his family members.  Patient became very angry and adamantly refused to go to the emergency room We did call patient after he left our office he did arrive at home.  Have recommended that he discuss with his family members and be taken to the emergency room for further evaluation Plan  Patient Instructions  I recommend you go to the emergency room by EMS - for evaluation. We discussed the dangers of driving.  Chest xray today .  Increase Prednisone 10mg  daily for 2 weeks and then 5mg  daily.  Albuterol inhaler 1-2 puffs every 4-6hrs as needed  Follow up with Cardiology as planned tomorrow  Follow up with Dr. Elsworth Soho in 4 weeks and As needed   Please contact office for sooner follow up if symptoms do not improve or worsen or seek emergency care      Check chest xray today

## 2021-02-01 NOTE — Assessment & Plan Note (Addendum)
Known history of coronary artery disease-patient with significant shortness of breath and chest tightness.  Suspicious for possible underlying angina.  Patient has been seen by cardiology and has an upcoming cardiac stress test tomorrow.  EKG in the office shows no acute changes with nonspecific ST changes.  Despite this I recommend patient be seen in the emergency room for further evaluation as he had acute chest tightness during exam today and very short of breath. Patient adamantly refuses and would not go to the emergency room or be transported by EMS.  He also would not allow his family to be contacted.  Plan  Patient Instructions  I recommend you go to the emergency room by EMS - for evaluation. We discussed the dangers of driving.  Chest xray today .  Increase Prednisone 10mg  daily for 2 weeks and then 5mg  daily.  Albuterol inhaler 1-2 puffs every 4-6hrs as needed  Follow up with Cardiology as planned tomorrow  Follow up with Dr. Elsworth Soho in 4 weeks and As needed   Please contact office for sooner follow up if symptoms do not improve or worsen or seek emergency care     Late add : obtained copies of previous EKG April and July 2022 with notable changes in today's EKG in V2-V3 lead with ST changes.  Called patient again and encouraged him to go to ER via EMS for evaluation . Patient agrees will go for evaluation .

## 2021-02-01 NOTE — Telephone Encounter (Signed)
Erroneous encounter

## 2021-02-01 NOTE — Telephone Encounter (Signed)
  pt having heart issues.. saw Cathrine at [facility],The pt has alsso had pulmonary issues. [facility], listened to his heart and said that pts heart was skipping a beat.. pt had an appt with pa this afternooon and was advised to go to ED pt refused.. wife is concerned and would like to know what steps to take.

## 2021-02-01 NOTE — ED Triage Notes (Signed)
Pt came in via POV from PCP d/t abnormal EKG. Pt states that he was given O2 while in their office & he says it did him breath easier. Pt states Hx of arrhythmias & he is accustomed to breathing heavier (per pt).

## 2021-02-01 NOTE — Patient Instructions (Addendum)
I recommend you go to the emergency room by EMS - for evaluation. We discussed the dangers of driving.  Chest xray today .  Increase Prednisone 10mg  daily for 2 weeks and then 5mg  daily.  Albuterol inhaler 1-2 puffs every 4-6hrs as needed  Follow up with Cardiology as planned tomorrow  Follow up with Dr. Elsworth Soho in 4 weeks and As needed   Please contact office for sooner follow up if symptoms do not improve or worsen or seek emergency care

## 2021-02-02 ENCOUNTER — Telehealth (HOSPITAL_COMMUNITY): Payer: Self-pay | Admitting: *Deleted

## 2021-02-02 NOTE — Telephone Encounter (Signed)
Patient given detailed instructions per Myocardial Perfusion Study Information Sheet for the test on 02/09/2021 at 7:45. Patient notified to arrive 15 minutes early and that it is imperative to arrive on time for appointment to keep from having the test rescheduled.  If you need to cancel or reschedule your appointment, please call the office within 24 hours of your appointment. . Patient verbalized understanding.Kenneth [name]

## 2021-02-05 ENCOUNTER — Other Ambulatory Visit: Payer: Self-pay

## 2021-02-05 ENCOUNTER — Encounter (HOSPITAL_COMMUNITY): Payer: Self-pay | Admitting: Emergency Medicine

## 2021-02-05 ENCOUNTER — Emergency Department (HOSPITAL_COMMUNITY)
Admission: EM | Admit: 2021-02-05 | Discharge: 2021-02-06 | Disposition: A | Discharge status: Home / Self Care | Payer: Medicare Other | Attending: Emergency Medicine | Admitting: Emergency Medicine

## 2021-02-05 ENCOUNTER — Emergency Department (HOSPITAL_COMMUNITY): Payer: Medicare Other

## 2021-02-05 DIAGNOSIS — J849 Interstitial pulmonary disease, unspecified: Secondary | ICD-10-CM

## 2021-02-05 DIAGNOSIS — Z7984 Long term (current) use of oral hypoglycemic drugs: Secondary | ICD-10-CM | POA: Insufficient documentation

## 2021-02-05 DIAGNOSIS — Z79899 Other long term (current) drug therapy: Secondary | ICD-10-CM | POA: Diagnosis not present

## 2021-02-05 DIAGNOSIS — Z7901 Long term (current) use of anticoagulants: Secondary | ICD-10-CM | POA: Diagnosis not present

## 2021-02-05 DIAGNOSIS — Z85828 Personal history of other malignant neoplasm of skin: Secondary | ICD-10-CM | POA: Diagnosis not present

## 2021-02-05 DIAGNOSIS — I2510 Atherosclerotic heart disease of native coronary artery without angina pectoris: Secondary | ICD-10-CM | POA: Insufficient documentation

## 2021-02-05 DIAGNOSIS — R42 Dizziness and giddiness: Secondary | ICD-10-CM | POA: Diagnosis not present

## 2021-02-05 DIAGNOSIS — I110 Hypertensive heart disease with heart failure: Secondary | ICD-10-CM | POA: Insufficient documentation

## 2021-02-05 DIAGNOSIS — J8417 Interstitial lung disease with progressive fibrotic phenotype in diseases classified elsewhere: Secondary | ICD-10-CM | POA: Diagnosis not present

## 2021-02-05 DIAGNOSIS — E119 Type 2 diabetes mellitus without complications: Secondary | ICD-10-CM | POA: Insufficient documentation

## 2021-02-05 DIAGNOSIS — I480 Paroxysmal atrial fibrillation: Secondary | ICD-10-CM | POA: Insufficient documentation

## 2021-02-05 DIAGNOSIS — Z87891 Personal history of nicotine dependence: Secondary | ICD-10-CM | POA: Diagnosis not present

## 2021-02-05 DIAGNOSIS — I5032 Chronic diastolic (congestive) heart failure: Secondary | ICD-10-CM | POA: Diagnosis not present

## 2021-02-05 DIAGNOSIS — R0602 Shortness of breath: Secondary | ICD-10-CM | POA: Diagnosis not present

## 2021-02-05 LAB — CBC
HCT: 34.4 % — ABNORMAL LOW (ref 39.0–52.0)
Hemoglobin: 11.4 g/dL — ABNORMAL LOW (ref 13.0–17.0)
MCH: 29.6 pg (ref 26.0–34.0)
MCHC: 33.1 g/dL (ref 30.0–36.0)
MCV: 89.4 fL (ref 80.0–100.0)
Platelets: 226 10*3/uL (ref 150–400)
RBC: 3.85 MIL/uL — ABNORMAL LOW (ref 4.22–5.81)
RDW: 13.2 % (ref 11.5–15.5)
WBC: 7.1 10*3/uL (ref 4.0–10.5)
nRBC: 0 % (ref 0.0–0.2)

## 2021-02-05 LAB — BASIC METABOLIC PANEL
Anion gap: 13 (ref 5–15)
BUN: 15 mg/dL (ref 8–23)
CO2: 21 mmol/L — ABNORMAL LOW (ref 22–32)
Calcium: 10.1 mg/dL (ref 8.9–10.3)
Chloride: 105 mmol/L (ref 98–111)
Creatinine, Ser: 1.16 mg/dL (ref 0.61–1.24)
GFR, Estimated: >60 mL/min (ref >60)
Glucose, Bld: 169 mg/dL — ABNORMAL HIGH (ref 70–99)
Potassium: 4 mmol/L (ref 3.5–5.1)
Sodium: 139 mmol/L (ref 135–145)

## 2021-02-05 LAB — TROPONIN I (HIGH SENSITIVITY): Troponin I (High Sensitivity): 7 ng/L (ref <18)

## 2021-02-05 NOTE — ED Provider Notes (Signed)
Emergency Medicine Provider Triage Evaluation Note  Kenneth [name] , a 75 y.o. male  was evaluated in triage.  Pt complains of shortness of breath for several days.  Patient has history of atypical atrial flutter and paroxysmal A. fib, chronically on Eliquis, and states that he feels he has been in A. fib.  He is also having dizziness.  Patient presented to the emergency department on 10/27, but left before being seen.  He then saw his cardiologist who recommended he follow-up with a pulmonologist, and he is currently wearing a Holter monitor.  Patient is not normally on oxygen at home, but has been on 2 L in the emergency department.  Review of Systems  Positive: Shortness of breath, intermittent cough, dizziness Negative: Chest pain, abdominal pain, leg pain or swelling  Physical Exam  BP (!) 142/78 (BP Location: Left Arm)   Pulse 86   Temp 97.7 F (36.5 C) (Oral)   Resp (!) 26   SpO2 100%  Gen:   Awake, no distress   Resp:  Normal effort  MSK:   Moves extremities without difficulty  Other:  Lungs clear to auscultation all fields, heart regular rate and rhythm  Medical Decision Making  Medically screening exam initiated at 8:42 PM.  Appropriate orders placed.  Kenneth [name] was informed that the remainder of the evaluation will be completed by another provider, this initial triage assessment does not replace that evaluation, and the importance of remaining in the ED until their evaluation is complete.     Kenneth [name] 02/05/21 2045    Dorie Rank, MD 02/06/21 1527

## 2021-02-05 NOTE — ED Triage Notes (Signed)
Patient here, daughter states that his heart is out of rhythm and he is having some shortness of breath.  Patient is having dizziness and feels like he is moving when his eyes are closed.  Patient has a history of irregular heart rate.

## 2021-02-06 LAB — TROPONIN I (HIGH SENSITIVITY): Troponin I (High Sensitivity): 5 ng/L (ref <18)

## 2021-02-06 NOTE — ED Notes (Signed)
Ambulated  pt in hallway o2 sat 100% on RA, HR 65. Pt denies dizziness or other issues while ambulating.

## 2021-02-06 NOTE — ED Notes (Signed)
Hooked patient to the cardiac monitor patient is resting with family at bedside

## 2021-02-06 NOTE — ED Provider Notes (Signed)
[facility] EMERGENCY DEPARTMENT Provider Note  CSN: [id number] Arrival date & time: 02/05/21 2014    History Chief Complaint  Patient presents with   Shortness of Breath    Kenneth [name] is a 75 y.o. male with history of ILD, Afib/flutter (s/p ablation on Eliquis), CAD s/p DES in July 2022 who has had several days of feeling like his heart is 'out of rhythm' which he describes as 'skipping beats and feeling tired'. He has had associated SOB with same. He was seen at the Cardiology office last week and placed on a Holter monitor and scheduled for Nuclear stress test for this Friday.  He was also referred to his Pulmonologist who saw him last week. They recommended he increase his prednisone and to come to the ED for evaluation. He was here on 10/28 but left due to wait times. He was feeling SOB again last night so he came back for re-evaluation. He reported feeling better after they put him on oxygen at the [facility] office but he was never hypoxic (documented SpO2 98%). He has not had a fever or increased cough.    Past Medical History:  Diagnosis Date   Arthritis    CAD S/P percutaneous coronary angioplasty 06/28/2016   Cardiologist- Dr. Ellyn Hack: 06/28/16 -- [facility] PCI (Synergy DES 2.75 x 20 mm); 07/10/16: Staged p-m LAD PCI (FFR 0.75) - DES PCI (Synergy DES 3.5 x 20 mm); 10/11/2020: mid-distal LAD 70% (FFR 0.75) DES PCI (Synergy DES 2.5 mm x 32 mm - overlaps prox-mid stent, tapered post-dilation 3.7 - 2.6 mm.   Chronic diastolic heart failure (HCC)    (Cardiologist: Dr. Ellyn Hack): EF > 75% / Hyperdynamic LV Fxn - indeterminate Diastolic Fxn.   Chronic dryness of both eyes    Diverticulosis of colon    Epiretinal membrane    "epiretinal attachment" (06/28/2016) 10-14-2017 per pt oringinally in both , now only unilateral    GERD (gastroesophageal reflux disease)    Hiatal hernia    History of adenomatous polyp of colon    History of concussion 08/23/2017   w/o loc--- per pt no residual    History of kidney stones    History of rheumatic fever 1958   NO Evidence of Vavlular disease -> only mild Aortic Sclerosis   History of skin cancer    excision leg;  froze the left arm--- unsure BCC or SCC    Hyperlipidemia    Hypertension    ILD (interstitial lung disease) ([place])    pulmologist-  dr Elsworth Soho--  secondary to methotrexate use --- last PFTs 04-25-2015  mild restriction   Iron deficiency anemia    Mild obstructive sleep apnea    10-04-2019 per pt  had a sleep study years ago , was told no cpap recommended pt denies    Nephrolithiasis    CT 09-07-2019 bilateral nonobstructive stones   Paroxysmal atrial fibrillation [facility]) cardiologist-  dr harding/  EP-- dr Rayann Heman   a. 11/2003 Tikosyn initiated - subsequently d/c'd;  b. 2006 RFCA for Afib @ [place];  c. 09/2011 Echo: EF 60-65%, Gr 2 DD;  d. DCCV 10/2011 , 04/2012, 12/ 2014, & 02/ 2015;  e. RFCA 01-13-2014   Plaque psoriasis 1969   followed by rheumatologist  and dermatologist @ [facility] in [place]   Right ureteral stone    S/P drug eluting coronary stent placement    06-28-2016  x1 to mRCA;  07-10-2016 x1 to mLAD   Seasonal allergies    Type 2 diabetes  mellitus ([place])    followed by pcp   Ureteral calculus, right    Wears hearing aid in both ears     Past Surgical History:  Procedure Laterality Date   [place] N/A 01/13/2014   repeat PVI, also left atrial ablation performed with successfull ablation of LA flutter by Dr Rayann Heman   ATRIAL FIBRILLATION ABLATION  08/2004   Dr Rolland Porter at [facility] , [place])   [place] N/A 05/22/2013   Procedure: CARDIOVERSION;  Surgeon: [name], MD;  Location: [facility];  Service: Cardiovascular;  Laterality: N/A;   CARDIOVERSION N/A 10/23/2011   Procedure: CARDIOVERSION;  Surgeon: Deboraha Sprang, MD;  Location: [facility] CATH LAB;  Service: Cardiovascular;  Laterality: N/A;   CARDIOVERSION N/A 05/01/2012   Procedure: CARDIOVERSION;  Surgeon: Deboraha Sprang, MD;  Location: [facility] CATH LAB;  Service: Cardiovascular;  Laterality: N/A;   CARPAL TUNNEL RELEASE Right 1980s   CATARACT EXTRACTION W/ INTRAOCULAR LENS  IMPLANT, BILATERAL  2012  approx.   CORONARY STENT INTERVENTION N/A 06/28/2016   Procedure: Coronary Stent Intervention;  Surgeon: Leonie Man, MD;  Location: [place] CV LAB;  Service: Cardiovascular;  Laterality: mRCA PCI -Synergy DES 2.75 m x 20 mm (3.1 mm)   CORONARY STENT INTERVENTION N/A 07/10/2016   Procedure: Coronary Stent Intervention;  Surgeon: Leonie Man, MD;  Location: [facility] [place] CV LAB;  Service: Cardiovascular: mLAD PCI Synergy DES 3.5 mm x 20 mm)   CORONARY STENT INTERVENTION N/A 10/11/2020   Procedure: CORONARY STENT INTERVENTION;  Surgeon: Leonie Man, MD;  Location: [place] CV LAB;  Service: Cardiovascular: mid-distal LAD ~70% (FFR 0.75): SYNERGY DES 2.5 MM X 32 MM (overlaps prox-mid Stent from 07/2016) - Tapered post-dilation 3.7 mm ->2.6 mm; Jailed D2 PTCA reduced 60% to 40%.   CYSTOSCOPY WITH RETROGRADE PYELOGRAM, URETEROSCOPY AND STENT PLACEMENT Right 10/16/2017   Procedure: CYSTOSCOPY WITH RIGHT RETROGRADE RIGHT URETEROSCOPY AND STONE BASKET EXTRACTION;  Surgeon: Irine Seal, MD;  Location: [facility];  Service: Urology;  Laterality: Right;   CYSTOSCOPY WITH RETROGRADE PYELOGRAM, URETEROSCOPY AND STENT PLACEMENT Right 10/08/2019   Procedure: CYSTOSCOPY WITH URETEROSCOPY; RETROGRADE PYELOGRAM; STONE BASKET EXTRACTION;  STENT PLACEMENT;  Surgeon: Cleon Gustin, MD;  Location[facility];  Service: Urology;  Laterality: Right;  1 HR   CYSTOSCOPY WITH RETROGRADE PYELOGRAM, URETEROSCOPY AND STENT PLACEMENT Right 03/28/2020   Procedure: CYSTOSCOPY WITH RIGHT RETROGRADE PYELOGRAM, URETEROSCOPY WITH HOLMIUM LASER AND STENT PLACEMENT;  Surgeon: Irine Seal, MD;  Location: [facility];  Service: Urology;  Laterality: Right;   CYSTOSCOPY/RETROGRADE/URETEROSCOPY/STONE EXTRACTION  WITH BASKET Right 10/27/2017   Procedure: CYSTOSCOPY/RETROGRADE/URETEROSCOPY/STONE EXTRACTION WITH BASKET/URETERAL STENT PLACEMENT and laser;  Surgeon: Irine Seal, MD;  Location: [facility];  Service: Urology;  Laterality: Right;   EXTRACORPOREAL SHOCK WAVE LITHOTRIPSY Left 08/10/2020   Procedure: EXTRACORPOREAL SHOCK WAVE LITHOTRIPSY (ESWL);  Surgeon: Ardis Hughs, MD;  Location[facility];  Service: Urology;  Laterality: Left;   HOLMIUM LASER APPLICATION Right 85/46/2703   Procedure: POSSIBLE HOLMIUM LASER APPLICATION;  Surgeon: Irine Seal, MD;  Location: [facility];  Service: Urology;  Laterality: Right;   HOLMIUM LASER APPLICATION Right 50/12/3816   Procedure: HOLMIUM LASER APPLICATION;  Surgeon: Cleon Gustin, MD;  Location: [facility];  Service: Urology;  Laterality: Right;   INTRAVASCULAR PRESSURE WIRE/FFR STUDY N/A 06/28/2016   Procedure: Intravascular Pressure Wire/FFR Study;  Surgeon: Leonie Man, MD;  Location: [place]  CV LAB;  Service: Cardiovascular: FFR of mLAD ~65% lesion = 0.75 post --> Staged PCI   INTRAVASCULAR PRESSURE WIRE/FFR STUDY N/A 10/11/2020   Procedure: INTRAVASCULAR PRESSURE WIRE/FFR STUDY;  Surgeon: Leonie Man, MD;  Location: [place] CV LAB;  Service: Cardiovascular;  Laterality: LAD mid-distal just after D2 --> FFR 0.75 SIGNIFICANT => PCI   LEFT HEART CATH AND CORONARY ANGIOGRAPHY  06/30/2003   Dr. Lia Foyer: NO Significant/Obstructive CAD; Preseved LVEF (for Recurrent Afib)   NASAL SINUS SURGERY     x 2   PROXIMAL INTERPHALANGEAL FUSION (PIP) Left 01-05-2001    dr graves   correction clawtoe and extensor tendon lengthening   RIGHT/LEFT HEART CATH AND CORONARY ANGIOGRAPHY N/A 06/28/2016   Procedure: Right/Left Heart Cath and Coronary Angiography;  Surgeon: Leonie Man, MD;  Location: [place] CV LAB;  Service: Cardiovascular: mRCA 99% (TIMI 2), mLAD ~65% (FFR 0.75), OM3 55%. mild Pulm HTN. Mod LVEDP  elevation. EF 55-65%.   RIGHT/LEFT HEART CATH AND CORONARY ANGIOGRAPHY N/A 10/11/2020   Procedure: RIGHT/LEFT HEART CATH AND CORONARY ANGIOGRAPHY;  Surgeon: Leonie Man, MD;  Location: [place] CV LAB;; Patent DES in [place] & p-mLAD. CULPRIT = m-d LAD 70% (just after D1 w/ Ost 60%) FFR 0.75 => DES PCI m-dLAD & PTCA ost D1 (overlapping prior stent distally & crossing D2 w/  Ost D2 post PTCA). RIGHT HEART CATH PRESSURES,  & CO-CI ARE NORMAL. NO PULM HTN   ROTATOR CUFF REPAIR Bilateral last one 2014   TONSILLECTOMY  age 50   TRANSTHORACIC ECHOCARDIOGRAM  09/29/2020   EF > 75%, Hyperdynamic. No RWMA. Mild LVH. ~ DD with mild LA dilation. Mild AI. Normal RV size & fxn, ? Dilated IVC c/w CVP ~ 15 mHg.   URETEROSCOPIC LASER LITHOTRIPSY STONE EXTRACTIONS/  STENT PLACEMENT Bilateral 09-10-2007   dr Jeffie Pollock  [facility]   per pt has had several prior ureteroscopic stone extraction since age 12 , last one 09-10-2007    Family History  Problem Relation Age of Onset   Coronary artery disease Mother    Breast cancer Mother    Colon cancer Mother    Heart disease Maternal Grandmother        MI   Stroke Paternal Grandfather        CVA   Coronary artery disease Paternal Grandfather    Esophageal cancer Neg Hx    Stomach cancer Neg Hx    Rectal cancer Neg Hx     Social History   Tobacco Use   Smoking status: Former    Types: Cigars    Quit date: 09/29/1997    Years since quitting: 23.3   Smokeless tobacco: Former    Types: Snuff, Chew   Tobacco comments:    Form smoker and chew tobacco 01/25/2021  Vaping Use   Vaping Use: Never used  Substance Use Topics   Alcohol use: No   Drug use: No     Home Medications Prior to Admission medications   Medication Sig Start Date End Date Taking? Authorizing Provider  acetaminophen (TYLENOL) 500 MG tablet Take 1,000 mg by mouth every 6 (six) hours as needed for mild pain or moderate pain.    [provider]  Adalimumab 40 MG/0.4ML PSKT Inject 40  mg into the skin every 14 (fourteen) days.     [provider]  albuterol (VENTOLIN HFA) 108 (90 Base) MCG/ACT inhaler Inhale 1-2 puffs into the lungs every 6 (six) hours as needed. 02/01/21   Parrett,  Tammy S, NP  apixaban (ELIQUIS) 5 MG TABS tablet Take 1 tablet (5 mg total) by mouth 2 (two) times daily. 01/12/21   Fenton, Clint R, PA  calcipotriene (DOVONOX) 0.005 % cream Apply 1 application topically 2 (two) times daily as needed (psoriasis).    [provider]  Calcium Carbonate-Vit D-Min (CALCIUM 1200 PO) Take 1,200 mg by mouth daily.    [provider]  cetirizine (ZYRTEC) 10 MG tablet Take 10 mg by mouth daily.    [provider]  clobetasol cream (TEMOVATE) 6.22 % Apply 1 application topically 2 (two) times daily as needed (for psoriasis).    [provider]  clopidogrel (PLAVIX) 75 MG tablet Take 1 tablet (75 mg total) by mouth daily. 10/11/20   Cheryln Manly, NP  diltiazem (CARTIA XT) 240 MG 24 hr capsule Take 1 capsule (240 mg total) by mouth daily. 01/08/21   Fenton, Clint R, PA  ferrous sulfate 325 (65 FE) MG tablet Take 325 mg by mouth daily.    Deberah Pelton, NP  gabapentin (NEURONTIN) 300 MG capsule Take 300-600 mg by mouth See admin instructions. Take 300 mg in the morning and 600 mg in th evening    [provider]  galantamine (RAZADYNE ER) 8 MG 24 hr capsule Take 16 mg by mouth daily with breakfast. 08/30/20   [provider]  HYDROcodone-acetaminophen (NORCO/VICODIN) 5-325 MG tablet Take 1-2 tablets by mouth every 6 (six) hours as needed. 08/16/20   [provider]  memantine (NAMENDA) 10 MG tablet Take 20 mg by mouth at bedtime. 08/30/20   [provider]  metFORMIN (GLUCOPHAGE-XR) 500 MG 24 hr tablet Take 500 mg by mouth at bedtime.    [provider]  Polyethyl Glycol-Propyl Glycol (SYSTANE OP) Place 1 drop into both eyes daily as needed (Dry eye).    [provider]  prazosin  (MINIPRESS) 1 MG capsule TAKE ONE CAPSULE BY MOUTH AT BEDTIME FOR 1 MONTH, THEN TAKE TWO CAPSULES AT BEDTIME FOR 1 MONTH, THEN TAKE THREE CAPSULES AT BEDTIME - NEW, TO REDUCE NIGHTMARES 12/13/20   [provider]  predniSONE (DELTASONE) 10 MG tablet Take 1 tablet (10 mg total) by mouth daily with breakfast. 02/01/21   Parrett, Fonnie Mu, NP  predniSONE (DELTASONE) 5 MG tablet Take 1 tablet by mouth once daily with breakfast 01/12/21   Rigoberto Noel, MD  rosuvastatin (CRESTOR) 20 MG tablet Take 1 tablet (20 mg total) by mouth every evening. 04/03/20 01/08/21  Ledora Bottcher, PA  tamsulosin (FLOMAX) 0.4 MG CAPS capsule Take 0.4 mg by mouth daily.    [provider]     Allergies    Hydrocodone, Other, Oxycodone, Pseudoephedrine, Tramadol, and Gemfibrozil   Review of Systems   Review of Systems A comprehensive review of systems was completed and negative except as noted in HPI.    Physical Exam BP 132/70   Pulse 69   Temp (!) 97.5 F (36.4 C) (Oral)   Resp 20   SpO2 100%   Physical Exam Vitals and nursing note reviewed.  Constitutional:      Appearance: Normal appearance.  HENT:     Head: Normocephalic and atraumatic.     Nose: Nose normal.     Mouth/Throat:     Mouth: Mucous membranes are moist.  Eyes:     Extraocular Movements: Extraocular movements intact.     Conjunctiva/sclera: Conjunctivae normal.  Cardiovascular:     Rate and Rhythm: Normal rate.  Pulmonary:     Effort: Pulmonary effort is normal. No respiratory distress.     Breath sounds: Normal breath sounds. No wheezing or rales.  Abdominal:     General: Abdomen is flat.     Palpations: Abdomen is soft.     Tenderness: There is no abdominal tenderness.  Musculoskeletal:        General: No swelling. Normal range of motion.     Cervical back: Neck supple.  Skin:    General: Skin is warm and dry.  Neurological:     General: No focal deficit present.     Mental Status: He is alert.   Psychiatric:        Mood and Affect: Mood normal.     ED Results / Procedures / Treatments   Labs (all labs ordered are listed, but only abnormal results are displayed) Labs Reviewed  BASIC METABOLIC PANEL - Abnormal; Notable for the following components:      Result Value   CO2 21 (*)    Glucose, Bld 169 (*)    All other components within normal limits  CBC - Abnormal; Notable for the following components:   RBC 3.85 (*)    Hemoglobin 11.4 (*)    HCT 34.4 (*)    All other components within normal limits  TROPONIN I (HIGH SENSITIVITY)  TROPONIN I (HIGH SENSITIVITY)    EKG EKG Interpretation  Date/Time:  Monday February 05 2021 20:26:08 EDT Ventricular Rate:  86 PR Interval:  164 QRS Duration: 80 QT Interval:  374 QTC Calculation: 447 R Axis:   28 Text Interpretation: Sinus rhythm with Premature supraventricular complexes Otherwise normal ECG No significant change since last tracing Confirmed by Calvert Cantor [phone]) on 02/06/2021 8:38:21 AM  Radiology DG Chest 2 View  Result Date: 02/05/2021 CLINICAL DATA:  Shortness of breath.  Dizziness. EXAM: CHEST - 2 VIEW COMPARISON:  Radiograph 02/01/2021. High-resolution chest CT 07/03/2020 FINDINGS: Chronic interstitial lung disease with diffuse interstitial and reticular opacities, not significantly changed over the past 4 days. No acute airspace disease. The heart is normal in size. No pleural effusion or pneumothorax. Electronic device projects over the left chest wall. Flowing syndesmophytes in the thoracic spine. No acute osseous abnormalities are seen. IMPRESSION: 1. No acute findings. 2. Chronic interstitial lung disease, stable in appearance from priors. Electronically Signed   By: Keith Rake M.D.   On: 02/05/2021 21:36    Procedures Procedures  Medications Ordered in the ED Medications - No data to display   MDM Rules/Calculators/A&P MDM  Patient here with SOB, cardiology concerned it may be anginal  equivalent and scheduled for stress test later this week. Pulm concerned it may be flare of ILD and recommended increasing prednisone which he has not done yet. He has not had atrial fib/flutter on any recent EKGs but does have a sinus arrhythmia which may be what he is feeling as being out of rhythm. He has had normal labs including Trop x 2 done while waiting for an exam room. He is not in any distress now and appears well. Will check ambulatory pulse ox and if maintains then I do not feel he would benefit from inpatient admission.   ED Course  I have reviewed the triage vital signs and the nursing notes.  Pertinent labs & imaging results that were available during my care of the patient were reviewed by me and considered in my medical decision making (see chart for details).  Clinical Course as of 02/06/21 0914  Tue Feb 06, 2021  0912 Patient ambulated without SOB or hypoxia. He was advised to increase his prednisone to 10mg  daily per [facility] office notes. Stress test scheduled for this Friday. RTED for any worsening breathing, hypoxia at home or any other concerns.  [CS]    Clinical Course User Index [CS] Truddie Hidden, MD    Final Clinical Impression(s) / ED Diagnoses Final diagnoses:  Interstitial lung disease [facility])    Rx / DC Orders ED Discharge Orders     None        Truddie Hidden, MD 02/06/21 [phone]

## 2021-02-09 ENCOUNTER — Other Ambulatory Visit: Payer: Self-pay

## 2021-02-09 ENCOUNTER — Ambulatory Visit (HOSPITAL_COMMUNITY): Payer: Medicare Other | Attending: Cardiovascular Disease

## 2021-02-09 ENCOUNTER — Other Ambulatory Visit (HOSPITAL_COMMUNITY): Payer: Self-pay | Admitting: Family

## 2021-02-09 ENCOUNTER — Other Ambulatory Visit (HOSPITAL_COMMUNITY): Payer: Self-pay | Admitting: Cardiology

## 2021-02-09 DIAGNOSIS — R0609 Other forms of dyspnea: Secondary | ICD-10-CM | POA: Insufficient documentation

## 2021-02-09 DIAGNOSIS — I25118 Atherosclerotic heart disease of native coronary artery with other forms of angina pectoris: Secondary | ICD-10-CM | POA: Insufficient documentation

## 2021-02-09 LAB — MYOCARDIAL PERFUSION IMAGING
Base ST Depression (mm): 0 mm
LV dias vol: 116 mL (ref 62–150)
LV sys vol: 46 mL
Nuc Stress EF: 61 %
Peak HR: 88 {beats}/min
Rest HR: 68 {beats}/min
Rest Nuclear Isotope Dose: 10.1 mCi
SDS: 0
SRS: 0
SSS: 0
ST Depression (mm): 0 mm
Stress Nuclear Isotope Dose: 31.2 mCi
TID: 1.05

## 2021-02-09 MED ORDER — REGADENOSON 0.4 MG/5ML IV SOLN
0.4000 mg | Freq: Once | INTRAVENOUS | Status: AC
  Administered 2021-02-09: 0.4 mg via INTRAVENOUS

## 2021-02-09 MED ORDER — TECHNETIUM TC 99M TETROFOSMIN IV KIT
10.1000 | PACK | Freq: Once | INTRAVENOUS | Status: AC | PRN
  Administered 2021-02-09: 10.1 via INTRAVENOUS
  Filled 2021-02-09: qty 11

## 2021-02-09 MED ORDER — TECHNETIUM TC 99M TETROFOSMIN IV KIT
31.2000 | PACK | Freq: Once | INTRAVENOUS | Status: AC | PRN
  Administered 2021-02-09: 31.2 via INTRAVENOUS
  Filled 2021-02-09: qty 32

## 2021-02-12 DIAGNOSIS — L821 Other seborrheic keratosis: Secondary | ICD-10-CM | POA: Diagnosis not present

## 2021-02-12 DIAGNOSIS — L400 Psoriasis vulgaris: Secondary | ICD-10-CM | POA: Diagnosis not present

## 2021-02-13 ENCOUNTER — Ambulatory Visit (HOSPITAL_COMMUNITY): Payer: Medicare Other | Admitting: Nurse Practitioner

## 2021-02-14 DIAGNOSIS — R0609 Other forms of dyspnea: Secondary | ICD-10-CM | POA: Diagnosis not present

## 2021-02-14 DIAGNOSIS — I25118 Atherosclerotic heart disease of native coronary artery with other forms of angina pectoris: Secondary | ICD-10-CM | POA: Diagnosis not present

## 2021-02-27 ENCOUNTER — Other Ambulatory Visit: Payer: Self-pay | Admitting: Adult Health

## 2021-03-06 ENCOUNTER — Ambulatory Visit: Payer: Medicare Other | Admitting: Pulmonary Disease

## 2021-03-07 ENCOUNTER — Telehealth: Payer: Self-pay | Admitting: Family

## 2021-03-07 MED ORDER — METOPROLOL SUCCINATE ER 25 MG PO TB24: 12.5000 mg | ORAL_TABLET | Freq: Every day | ORAL | 3 refills | Status: DC

## 2021-03-07 NOTE — Telephone Encounter (Signed)
Patient's wife returning a call for monitor results. She requests to call her number: [phone]

## 2021-03-07 NOTE — Telephone Encounter (Signed)
[facility] called stating patient spoke to [place] yesterday and a script for medication was to be sent to them.  However, no script was sent to them.  Please see result note for monitor.

## 2021-03-07 NOTE — Telephone Encounter (Signed)
Wife is aware of results and recommendations (ok per DPR)-they were able to pick medication up to start.   No further questions at this time.

## 2021-03-07 NOTE — Telephone Encounter (Signed)
Loel Dubonnet, NP  03/06/2021  5:19 PM EST     PAC (early beat) can also contribute to fatigue and exercise intolerance. Recommend addition ofToprol 12.5mg  QD to be taken at bedtime. Call us in 2 weeks to check in on symptoms. This will hopefully help prevent PACs without causing fatigue. Recommend following up with pulmonology as scheduled regarding shortness of breath     Spoke to wife (ok per DPR)-aware of recommendations and verbalized understanding.  Rx sent to pharmacy

## 2021-03-10 ENCOUNTER — Other Ambulatory Visit: Payer: Self-pay | Admitting: Urology

## 2021-03-15 DIAGNOSIS — E785 Hyperlipidemia, unspecified: Secondary | ICD-10-CM | POA: Diagnosis not present

## 2021-03-15 DIAGNOSIS — I10 Essential (primary) hypertension: Secondary | ICD-10-CM | POA: Diagnosis not present

## 2021-03-15 DIAGNOSIS — I2510 Atherosclerotic heart disease of native coronary artery without angina pectoris: Secondary | ICD-10-CM | POA: Diagnosis not present

## 2021-03-15 DIAGNOSIS — Z7952 Long term (current) use of systemic steroids: Secondary | ICD-10-CM | POA: Diagnosis not present

## 2021-03-15 DIAGNOSIS — Z7984 Long term (current) use of oral hypoglycemic drugs: Secondary | ICD-10-CM | POA: Diagnosis not present

## 2021-03-15 DIAGNOSIS — R0602 Shortness of breath: Secondary | ICD-10-CM | POA: Diagnosis not present

## 2021-03-15 DIAGNOSIS — J849 Interstitial pulmonary disease, unspecified: Secondary | ICD-10-CM | POA: Diagnosis not present

## 2021-03-15 DIAGNOSIS — Z79899 Other long term (current) drug therapy: Secondary | ICD-10-CM | POA: Diagnosis not present

## 2021-03-15 DIAGNOSIS — K5730 Diverticulosis of large intestine without perforation or abscess without bleeding: Secondary | ICD-10-CM | POA: Diagnosis not present

## 2021-03-15 DIAGNOSIS — Z888 Allergy status to other drugs, medicaments and biological substances status: Secondary | ICD-10-CM | POA: Diagnosis not present

## 2021-03-15 DIAGNOSIS — I517 Cardiomegaly: Secondary | ICD-10-CM | POA: Diagnosis not present

## 2021-03-15 DIAGNOSIS — N201 Calculus of ureter: Secondary | ICD-10-CM | POA: Diagnosis not present

## 2021-03-15 DIAGNOSIS — R450 Nervousness: Secondary | ICD-10-CM | POA: Diagnosis not present

## 2021-03-15 DIAGNOSIS — E119 Type 2 diabetes mellitus without complications: Secondary | ICD-10-CM | POA: Diagnosis not present

## 2021-03-15 DIAGNOSIS — K219 Gastro-esophageal reflux disease without esophagitis: Secondary | ICD-10-CM | POA: Diagnosis not present

## 2021-03-15 DIAGNOSIS — Z7982 Long term (current) use of aspirin: Secondary | ICD-10-CM | POA: Diagnosis not present

## 2021-03-15 DIAGNOSIS — Z87891 Personal history of nicotine dependence: Secondary | ICD-10-CM | POA: Diagnosis not present

## 2021-03-15 DIAGNOSIS — I4891 Unspecified atrial fibrillation: Secondary | ICD-10-CM | POA: Diagnosis not present

## 2021-03-15 DIAGNOSIS — R0989 Other specified symptoms and signs involving the circulatory and respiratory systems: Secondary | ICD-10-CM | POA: Diagnosis not present

## 2021-03-15 DIAGNOSIS — N202 Calculus of kidney with calculus of ureter: Secondary | ICD-10-CM | POA: Diagnosis not present

## 2021-03-15 DIAGNOSIS — J986 Disorders of diaphragm: Secondary | ICD-10-CM | POA: Diagnosis not present

## 2021-03-15 DIAGNOSIS — Z20822 Contact with and (suspected) exposure to covid-19: Secondary | ICD-10-CM | POA: Diagnosis not present

## 2021-03-15 DIAGNOSIS — Z885 Allergy status to narcotic agent status: Secondary | ICD-10-CM | POA: Diagnosis not present

## 2021-03-15 DIAGNOSIS — Z7902 Long term (current) use of antithrombotics/antiplatelets: Secondary | ICD-10-CM | POA: Diagnosis not present

## 2021-03-15 DIAGNOSIS — Z955 Presence of coronary angioplasty implant and graft: Secondary | ICD-10-CM | POA: Diagnosis not present

## 2021-03-16 DIAGNOSIS — J849 Interstitial pulmonary disease, unspecified: Secondary | ICD-10-CM | POA: Diagnosis not present

## 2021-03-19 ENCOUNTER — Ambulatory Visit (INDEPENDENT_AMBULATORY_CARE_PROVIDER_SITE_OTHER): Payer: Medicare Other | Admitting: Pulmonary Disease

## 2021-03-19 ENCOUNTER — Other Ambulatory Visit: Payer: Self-pay

## 2021-03-19 ENCOUNTER — Encounter: Payer: Self-pay | Admitting: Pulmonary Disease

## 2021-03-19 ENCOUNTER — Telehealth: Payer: Self-pay | Admitting: Cardiology

## 2021-03-19 VITALS — BP 136/70 | HR 80 | Temp 98.1°F | Ht 75.0 in | Wt 222.0 lb

## 2021-03-19 DIAGNOSIS — I5032 Chronic diastolic (congestive) heart failure: Secondary | ICD-10-CM

## 2021-03-19 DIAGNOSIS — R0602 Shortness of breath: Secondary | ICD-10-CM | POA: Diagnosis not present

## 2021-03-19 DIAGNOSIS — I2510 Atherosclerotic heart disease of native coronary artery without angina pectoris: Secondary | ICD-10-CM | POA: Diagnosis not present

## 2021-03-19 DIAGNOSIS — J849 Interstitial pulmonary disease, unspecified: Secondary | ICD-10-CM | POA: Diagnosis not present

## 2021-03-19 MED ORDER — PREDNISONE 10 MG PO TABS: 10.0000 mg | ORAL_TABLET | Freq: Every day | ORAL | 0 refills | Status: DC

## 2021-03-19 NOTE — Telephone Encounter (Signed)
Pt c/o medication issue:  1. Name of Medication:  apixaban (ELIQUIS) 5 MG TABS tablet   2. How are you currently taking this medication (dosage and times per day)?   3. Are you having a reaction (difficulty breathing--STAT)?  No   4. What is your medication issue?   Patient's wife would like to inform Dr. Ellyn Hack that on the night of 12/07 the patient went to the hospital due to a kidney stone. She states he was passing blood in his urine and he was taken off Eliquis. She states he has not taken it since 12/07 and she would like to know if Dr. Ellyn Hack has further recommendations. Please advise.

## 2021-03-19 NOTE — Assessment & Plan Note (Signed)
He does not seem to be retaining significant fluid, I note echo showing moderate aortic stenosis and hyperdynamic LV.  This could also be playing a role in his shortness of breath, plan is to follow-up echo in june

## 2021-03-19 NOTE — Assessment & Plan Note (Signed)
Kenneth [name] is more short of breath and it is difficult to assess clinically whether this is due to worsening ILD or due to his cardiac issues.  He does not desaturate significantly on ambulation, only went down to 96% which is reassuring.  Unfortunately he cannot perform PFTs without significant distress and it is difficult to quantitate lung function. We will reassess with high-resolution CT chest to see if there has been interval worsening compared to March. I have in the meantime asked him to increase prednisone to 10 mg daily for 2 weeks and then cut back to 5 mg daily. In the past we have discussed immunosuppressants but he prefers to be on low-dose prednisone

## 2021-03-19 NOTE — Telephone Encounter (Signed)
Will need Dr. Ellyn Hack to weigh in. His instructions from the ER were to resume when he had no more bleeding.

## 2021-03-19 NOTE — Patient Instructions (Signed)
  X Increase prednisone to 10 mg  x 2 weeks , then return to 5 mg Rx for 10 mg x 30 tabs   X HR CT chest to follow on fibrosis  X ambulatory sat

## 2021-03-19 NOTE — Progress Notes (Signed)
   Subjective:    Patient ID: Kenneth [name], male    DOB: [birth year], 75 y.o.   MRN: [id number]  HPI   74 yo man with  steroid responsive ILD , probable NSIP.  There is a history of being on methotrexate in the remote past. He  responded well to steroids and is maintained on low-dose about 5 mg of prednisone.  We have discussed immunosuppressants in the past and he has preferred not to go on this   PMH - CAD, atrial fibrillation for 15 years s/p atrial ablation 01/2014 . He takes flecainide if his heart rate gets too high.  For psoriatic arthritis, he was on Enbrel and methotrexate weekly for 3 years ,  then on Humira.  Now on adalimumab  Reviewed last office visit 01/2021 -cardiology evaluation recommended 10/2020 He had a left heart cath and required PCI to LAD , other stents were patent.  He underwent a low risk Lexiscan Myoview and had a long-term monitor placed 02/2021 and was started on Toprol  He continues to complain of increased shortness of breath.  He had hematuria and Eliquis was stopped.  He is the main caregiver for his wife who seems to need assistance for all activities of daily living Reports an occasional cough, no significant wheezing, albuterol MDI is not helping much   Significant tests/ events reviewed  CT angiogram 09/2007 which shows bilateral groundglass opacities but a repeat CT in 10/2007 and shows resolution of these infiltrates.  01/27/2014 HRCT - patchy areas of very mild ground-glass attenuation and subpleural reticulation.  CHest HRCT 04/2016 >> slight worse , NSIP pattern HRCT 05/2017 unchanged NSIP, 2-4 mm new  benign nodules   HRCT 10/2019 >> likely NSIP, unchanged from 2019     PFT 04/2015 >> ratio nml, FVC 61%, TLC 68%, DLCO 67%      LHC 10/2020 LAD stent was placed, other stents patent  09/2020 echo LVEF more than 35%, hyperdynamic, moderate aortic stenosis, dilated LA, normal RVSP   Review of Systems neg for any significant sore throat,  dysphagia, itching, sneezing, nasal congestion or excess/ purulent secretions, fever, chills, sweats, unintended wt loss, pleuritic or exertional cp, hempoptysis, orthopnea pnd or change in chronic leg swelling. Also denies presyncope, palpitations, heartburn, abdominal pain, nausea, vomiting, diarrhea or change in bowel or urinary habits, dysuria,hematuria, rash, arthralgias, visual complaints, headache, numbness weakness or ataxia.     Objective:   Physical Exam  Gen. Pleasant, obese, in no distress ENT - no lesions, no post nasal drip Neck: No JVD, no thyromegaly, no carotid bruits Lungs: no use of accessory muscles, no dullness to percussion, dry crackles right base Cardiovascular: Rhythm regular, heart sounds  normal, no murmurs or gallops, no peripheral edema Musculoskeletal: No deformities, no cyanosis or clubbing , no tremors       Assessment & Plan:

## 2021-03-20 NOTE — Telephone Encounter (Signed)
I called pt and advised him to Dr. Allison Quarry recommendation. He was concerned that he still has 2 more stones to pass. I advised he resume tomorrow and if he has any more bleeding to let us know. Pt requested I call him later as he was in a meeting with a  good phone # to call

## 2021-03-20 NOTE — Telephone Encounter (Signed)
Usually hold ~1 week or so - until no more hematuria.   Then restart.  [name]

## 2021-03-21 ENCOUNTER — Telehealth: Payer: Self-pay | Admitting: Pulmonary Disease

## 2021-03-21 NOTE — Telephone Encounter (Signed)
disregard

## 2021-03-22 ENCOUNTER — Telehealth: Payer: Self-pay | Admitting: Cardiology

## 2021-03-22 ENCOUNTER — Other Ambulatory Visit: Payer: Self-pay

## 2021-03-22 DIAGNOSIS — R0609 Other forms of dyspnea: Secondary | ICD-10-CM

## 2021-03-22 NOTE — Telephone Encounter (Signed)
I contacted patient, aware that he should keep the appointment on 12/20 to rule out cardiac causes for increase SOB. Patient states he just seen pulmonary (note in epic) they increase prednisone, and chest CT scheduled for 12/28- per note they were unsure if cardiac related or worsening pulmonary concerns. I did advise that at that visit they did not mention weight gain or fluid overload. Patient states he will keep the appointment, but I did advise if any worsening symptoms of SOB to be evaluated.  Patient and patient wife verbalized understanding. Wife also had concerns about wait time for calls to get to [place]- I did apologize for the wait, and would send a message to Brunetta Genera, NP to make aware of this on going issue. Thanks!

## 2021-03-22 NOTE — Telephone Encounter (Signed)
He had cardiac cath 10/2020 with PCI to LAD. Subsequent Lexiscan myoview performed 02/2021 was low risk study. Previous echo 09/2020 with moderate aortic stenosis. Given worsening dyspnea, reasonable to go ahead and repeat echocardiogram. Please order for patient. If he wishes to defer follow up with Dr. Ellyn Hack or myself until after performed that would be reasonable.  Dr. Elsworth Soho (pulmonology) just started increased Prednisone dose 3 days ago so will take a few days to see if that is effective. His dyspnea is likely multifactorial diastolic heart failure, ILD, deconditioning. CT as ordered by Dr. Elsworth Soho is scheduled for 04/04/21.  There is the option to sign up for MyChart so they can send a message requesting a call rather than waiting on hold if they would like.   Loel Dubonnet, NP

## 2021-03-22 NOTE — Telephone Encounter (Signed)
Called patient back. He would like to get testing completed first. ECHO ordered.   Cancelled appointment with NP.   Thanks!  Scheduling please get ECHO scheduled.   Thanks!

## 2021-03-22 NOTE — Telephone Encounter (Signed)
Pt c/o Shortness Of Breath: STAT if SOB developed within the last 24 hours or pt is noticeably SOB on the phone  1. Are you currently SOB (can you hear that pt is SOB on the phone)?  PT IS CURRENTLY SOB, I DO NOT HEAR IT OVER THE PHONE  2. How long have you been experiencing SOB? PT HAS BEEN EXPERIENCING SOB FOR OVER  1 MONTH  3. Are you SOB when sitting or when up moving around? BOTH  4. Are you currently experiencing any other symptoms? TIREDNESS ALL THE TIME   PT'S PULMONARY AND [facility] CALLED EMS TO TRANSPORT THE PT TO THE HOSPITAL AND PT REFUSED TO GO.  PT SAID IF HE'S NOT GOING TO BE TREATED FOR THE SOB DURING HIS APPT. ON 03/27/21, HE DOES NOT WANT TO ATTEND THAT APPT.

## 2021-03-23 ENCOUNTER — Telehealth: Payer: Self-pay

## 2021-03-23 NOTE — Telephone Encounter (Signed)
Spoke with patient.  Still has two kidney stones he needs to pass.  Was advised to restart Eliquis last Monday 12/12 which he did. Urine immediately was bloody again so he stopped it the next day.  Has not taken it since and urine is significanltly less bloody.  Advised to continue to hold Eliquis through the weekend and to call back on Monday with update. If patient able to pass stones can restart Eliquis.  If not, will have to decide on risk vs benefit

## 2021-03-23 NOTE — Telephone Encounter (Signed)
Patient's wife calling to report that the patient was diagnosed with Kidney stone and has had bleeding on Eliquis.    They would like a call to discuss and receive further advisement about Eliquis.

## 2021-03-27 ENCOUNTER — Ambulatory Visit (HOSPITAL_BASED_OUTPATIENT_CLINIC_OR_DEPARTMENT_OTHER): Payer: Medicare Other | Admitting: Family

## 2021-03-27 ENCOUNTER — Other Ambulatory Visit: Payer: Self-pay

## 2021-03-27 ENCOUNTER — Ambulatory Visit (INDEPENDENT_AMBULATORY_CARE_PROVIDER_SITE_OTHER): Payer: Medicare Other | Admitting: Family

## 2021-03-27 ENCOUNTER — Encounter (HOSPITAL_BASED_OUTPATIENT_CLINIC_OR_DEPARTMENT_OTHER): Payer: Self-pay | Admitting: Family

## 2021-03-27 VITALS — BP 124/72 | HR 70 | Ht 75.0 in | Wt 224.0 lb

## 2021-03-27 DIAGNOSIS — I25118 Atherosclerotic heart disease of native coronary artery with other forms of angina pectoris: Secondary | ICD-10-CM | POA: Diagnosis not present

## 2021-03-27 DIAGNOSIS — I480 Paroxysmal atrial fibrillation: Secondary | ICD-10-CM

## 2021-03-27 DIAGNOSIS — D6859 Other primary thrombophilia: Secondary | ICD-10-CM

## 2021-03-27 DIAGNOSIS — J849 Interstitial pulmonary disease, unspecified: Secondary | ICD-10-CM | POA: Diagnosis not present

## 2021-03-27 DIAGNOSIS — I10 Essential (primary) hypertension: Secondary | ICD-10-CM

## 2021-03-27 DIAGNOSIS — E785 Hyperlipidemia, unspecified: Secondary | ICD-10-CM | POA: Diagnosis not present

## 2021-03-27 DIAGNOSIS — I491 Atrial premature depolarization: Secondary | ICD-10-CM

## 2021-03-27 DIAGNOSIS — I5189 Other ill-defined heart diseases: Secondary | ICD-10-CM | POA: Diagnosis not present

## 2021-03-27 MED ORDER — NITROGLYCERIN 0.4 MG SL SUBL: 0.4000 mg | SUBLINGUAL_TABLET | SUBLINGUAL | 3 refills | Status: DC | PRN

## 2021-03-27 MED ORDER — METOPROLOL SUCCINATE ER 25 MG PO TB24: 25.0000 mg | ORAL_TABLET | Freq: Every day | ORAL | 3 refills | Status: DC

## 2021-03-27 NOTE — Patient Instructions (Addendum)
Medication Instructions:  Your physician has recommended you make the following change in your medication:    CHANGE Prednisone to 10mg  for 2 weeks then reduce to 5mg  daily per Dr. Elsworth Soho  CHANGE Metoprolol to one 25mg  tablet daily  START Nitroglycerin as needed for chest pain For as needed Nitroglycerin, if you develop chest pain: Sit and rest 5 minutes. If chest pain does not resolve place 1 nitroglycerin under your tongue and wait 5 minutes. If chest pain does not resolve, place a 2nd nitroglycerin under your tongue and wait 5 more minutes. If chest pain does not resolve, place a 3rd nitroglycerin under your tongue and seek emergency services.    *If you need a refill on your cardiac medications before your next appointment, please call your pharmacy*   Lab Work: None ordered today.   Testing/Procedures: Proceed with CT scan and echocardiogram as scheduled.  Follow-Up: At [facility], you and your health needs are our priority.  As part of our continuing mission to provide you with exceptional heart care, we have created designated Provider Care Teams.  These Care Teams include your primary Cardiologist (physician) and Advanced Practice Providers (APPs -  Physician Assistants and Nurse Practitioners) who all work together to provide you with the care you need, when you need it.  We recommend signing up for the patient portal called "MyChart".  Sign up information is provided on this After Visit Summary.  MyChart is used to connect with patients for Virtual Visits (Telemedicine).  Patients are able to view lab/test results, encounter notes, upcoming appointments, etc.  Non-urgent messages can be sent to your provider as well.   To learn more about what you can do with MyChart, go to [email or web address].    Your next appointment:   3 month(s)  The format for your next appointment:   In Person  Provider:   [name], MD     Other Instructions

## 2021-03-27 NOTE — Progress Notes (Signed)
Office Visit    Patient Name: Kenneth [name] Date of Encounter: 03/27/2021  PCP:  Landry Mellow, MD   [facility]  Cardiologist:  [name], MD  Advanced Practice Provider:  No care team member to display Electrophysiologist:  None    Chief Complaint    Kenneth [name] is a 75 y.o. male with a hx of atrial fibrillation/flutter s/p ablation 01/2014, CAd s/p PCI to LAD 12/2424, chronic diastolic heart failure, HLD presents today for shortness of breath   Past Medical History    Past Medical History:  Diagnosis Date   Arthritis    CAD S/P percutaneous coronary angioplasty 06/28/2016   Cardiologist- Dr. Ellyn Hack: 06/28/16 -- [facility] PCI (Synergy DES 2.75 x 20 mm); 07/10/16: Staged p-m LAD PCI (FFR 0.75) - DES PCI (Synergy DES 3.5 x 20 mm); 10/11/2020: mid-distal LAD 70% (FFR 0.75) DES PCI (Synergy DES 2.5 mm x 32 mm - overlaps prox-mid stent, tapered post-dilation 3.7 - 2.6 mm.   Chronic diastolic heart failure (HCC)    (Cardiologist: Dr. Ellyn Hack): EF > 75% / Hyperdynamic LV Fxn - indeterminate Diastolic Fxn.   Chronic dryness of both eyes    Diverticulosis of colon    Epiretinal membrane    "epiretinal attachment" (06/28/2016) 10-14-2017 per pt oringinally in both , now only unilateral    GERD (gastroesophageal reflux disease)    Hiatal hernia    History of adenomatous polyp of colon    History of concussion 08/23/2017   w/o loc--- per pt no residual   History of kidney stones    History of rheumatic fever 1958   NO Evidence of Vavlular disease -> only mild Aortic Sclerosis   History of skin cancer    excision leg;  froze the left arm--- unsure BCC or SCC    Hyperlipidemia    Hypertension    ILD (interstitial lung disease) ([place])    pulmologist-  dr Elsworth Soho--  secondary to methotrexate use --- last PFTs 04-25-2015  mild restriction   Iron deficiency anemia    Mild obstructive sleep apnea    10-04-2019 per pt  had a sleep study years ago , was told no  cpap recommended pt denies    Nephrolithiasis    CT 09-07-2019 bilateral nonobstructive stones   Paroxysmal atrial fibrillation [facility]) cardiologist-  dr harding/  EP-- dr Rayann Heman   a. 11/2003 Tikosyn initiated - subsequently d/c'd;  b. 2006 RFCA for Afib @ [place];  c. 09/2011 Echo: EF 60-65%, Gr 2 DD;  d. DCCV 10/2011 , 04/2012, 12/ 2014, & 02/ 2015;  e. RFCA 01-13-2014   Plaque psoriasis 1969   followed by rheumatologist  and dermatologist @ [facility] in [place]   Right ureteral stone    S/P drug eluting coronary stent placement    06-28-2016  x1 to mRCA;  07-10-2016 x1 to mLAD   Seasonal allergies    Type 2 diabetes mellitus ([place])    followed by pcp   Ureteral calculus, right    Wears hearing aid in both ears    Past Surgical History:  Procedure Laterality Date   [place] N/A 01/13/2014   repeat PVI, also left atrial ablation performed with successfull ablation of LA flutter by Dr Rayann Heman   ATRIAL FIBRILLATION ABLATION  08/2004   Dr Rolland Porter at [facility] [place])   [place] N/A 05/22/2013   Procedure: CARDIOVERSION;  Surgeon: [name], MD;  Location: [place];  Service: Cardiovascular;  Laterality: N/A;   CARDIOVERSION N/A 10/23/2011   Procedure: CARDIOVERSION;  Surgeon: Deboraha Sprang, MD;  Location: [facility] CATH LAB;  Service: Cardiovascular;  Laterality: N/A;   CARDIOVERSION N/A 05/01/2012   Procedure: CARDIOVERSION;  Surgeon: Deboraha Sprang, MD;  Location: [facility] CATH LAB;  Service: Cardiovascular;  Laterality: N/A;   CARPAL TUNNEL RELEASE Right 1980s   CATARACT EXTRACTION W/ INTRAOCULAR LENS  IMPLANT, BILATERAL  2012  approx.   CORONARY STENT INTERVENTION N/A 06/28/2016   Procedure: Coronary Stent Intervention;  Surgeon: Leonie Man, MD;  Location: [place] CV LAB;  Service: Cardiovascular;  Laterality: mRCA PCI -Synergy DES 2.75 m x 20 mm (3.1 mm)   CORONARY STENT INTERVENTION N/A 07/10/2016   Procedure: Coronary Stent Intervention;   Surgeon: Leonie Man, MD;  Location: [place] CV LAB;  Service: Cardiovascular: mLAD PCI Synergy DES 3.5 mm x 20 mm)   CORONARY STENT INTERVENTION N/A 10/11/2020   Procedure: CORONARY STENT INTERVENTION;  Surgeon: Leonie Man, MD;  Location: [place] CV LAB;  Service: Cardiovascular: mid-distal LAD ~70% (FFR 0.75): SYNERGY DES 2.5 MM X 32 MM (overlaps prox-mid Stent from 07/2016) - Tapered post-dilation 3.7 mm ->2.6 mm; Jailed D2 PTCA reduced 60% to 40%.   CYSTOSCOPY WITH RETROGRADE PYELOGRAM, URETEROSCOPY AND STENT PLACEMENT Right 10/16/2017   Procedure: CYSTOSCOPY WITH RIGHT RETROGRADE RIGHT URETEROSCOPY AND STONE BASKET EXTRACTION;  Surgeon: Irine Seal, MD;  Location: [facility];  Service: Urology;  Laterality: Right;   CYSTOSCOPY WITH RETROGRADE PYELOGRAM, URETEROSCOPY AND STENT PLACEMENT Right 10/08/2019   Procedure: CYSTOSCOPY WITH URETEROSCOPY; RETROGRADE PYELOGRAM; STONE BASKET EXTRACTION;  STENT PLACEMENT;  Surgeon: Cleon Gustin, MD;  Location: [facility];  Service: Urology;  Laterality: Right;  1 HR   CYSTOSCOPY WITH RETROGRADE PYELOGRAM, URETEROSCOPY AND STENT PLACEMENT Right 03/28/2020   Procedure: CYSTOSCOPY WITH RIGHT RETROGRADE PYELOGRAM, URETEROSCOPY WITH HOLMIUM LASER AND STENT PLACEMENT;  Surgeon: Irine Seal, MD;  Location: [facility];  Service: Urology;  Laterality: Right;   CYSTOSCOPY/RETROGRADE/URETEROSCOPY/STONE EXTRACTION WITH BASKET Right 10/27/2017   Procedure: CYSTOSCOPY/RETROGRADE/URETEROSCOPY/STONE EXTRACTION WITH BASKET/URETERAL STENT PLACEMENT and laser;  Surgeon: Irine Seal, MD;  Location: [facility];  Service: Urology;  Laterality: Right;   EXTRACORPOREAL SHOCK WAVE LITHOTRIPSY Left 08/10/2020   Procedure: EXTRACORPOREAL SHOCK WAVE LITHOTRIPSY (ESWL);  Surgeon: Ardis Hughs, MD;  Location: [facility];  Service: Urology;  Laterality: Left;   HOLMIUM LASER APPLICATION Right 62/95/2841   Procedure:  POSSIBLE HOLMIUM LASER APPLICATION;  Surgeon: Irine Seal, MD;  Location: [facility];  Service: Urology;  Laterality: Right;   HOLMIUM LASER APPLICATION Right 32/44/0102   Procedure: HOLMIUM LASER APPLICATION;  Surgeon: Cleon Gustin, MD;  Location: [facility];  Service: Urology;  Laterality: Right;   INTRAVASCULAR PRESSURE WIRE/FFR STUDY N/A 06/28/2016   Procedure: Intravascular Pressure Wire/FFR Study;  Surgeon: Leonie Man, MD;  Location: [place] CV LAB;  Service: Cardiovascular: FFR of mLAD ~65% lesion = 0.75 post --> Staged PCI   INTRAVASCULAR PRESSURE WIRE/FFR STUDY N/A 10/11/2020   Procedure: INTRAVASCULAR PRESSURE WIRE/FFR STUDY;  Surgeon: Leonie Man, MD;  Location: [place] CV LAB;  Service: Cardiovascular;  Laterality: LAD mid-distal just after D2 --> FFR 0.75 SIGNIFICANT => PCI   LEFT HEART CATH AND CORONARY ANGIOGRAPHY  06/30/2003   Dr. Lia Foyer: NO Significant/Obstructive CAD; Preseved LVEF (for Recurrent Afib)   NASAL SINUS SURGERY     x 2   PROXIMAL INTERPHALANGEAL FUSION (PIP) Left  01-05-2001    dr graves   correction clawtoe and extensor tendon lengthening   RIGHT/LEFT HEART CATH AND CORONARY ANGIOGRAPHY N/A 06/28/2016   Procedure: Right/Left Heart Cath and Coronary Angiography;  Surgeon: Leonie Man, MD;  Location: [place] CV LAB;  Service: Cardiovascular: mRCA 99% (TIMI 2), mLAD ~65% (FFR 0.75), OM3 55%. mild Pulm HTN. Mod LVEDP elevation. EF 55-65%.   RIGHT/LEFT HEART CATH AND CORONARY ANGIOGRAPHY N/A 10/11/2020   Procedure: RIGHT/LEFT HEART CATH AND CORONARY ANGIOGRAPHY;  Surgeon: Leonie Man, MD;  Location: [place] CV LAB;; Patent DES in [place] & p-mLAD. CULPRIT = m-d LAD 70% (just after D1 w/ Ost 60%) FFR 0.75 => DES PCI m-dLAD & PTCA ost D1 (overlapping prior stent distally & crossing D2 w/  Ost D2 post PTCA). RIGHT HEART CATH PRESSURES,  & CO-CI ARE NORMAL. NO PULM HTN   ROTATOR CUFF REPAIR Bilateral last one 2014    TONSILLECTOMY  age 32   TRANSTHORACIC ECHOCARDIOGRAM  09/29/2020   EF > 75%, Hyperdynamic. No RWMA. Mild LVH. ~ DD with mild LA dilation. Mild AI. Normal RV size & fxn, ? Dilated IVC c/w CVP ~ 15 mHg.   URETEROSCOPIC LASER LITHOTRIPSY STONE EXTRACTIONS/  STENT PLACEMENT Bilateral 09-10-2007   dr Jeffie Pollock  [facility]   per pt has had several prior ureteroscopic stone extraction since age 39 , last one 09-10-2007    Allergies  Allergies  Allergen Reactions   Hydrocodone Shortness Of Breath    In combination with decongestants   Other Palpitations and Other (See Comments)    ALL DECONGESTANTS - CAUSE PALPITATIONS; THROWS HEART RHYTHM OUT OF BALANCE   Oxycodone Shortness Of Breath   Pseudoephedrine Palpitations   Tramadol Shortness Of Breath and Rash    flushing   Gemfibrozil Other (See Comments)    Dizziness      History of Present Illness    CHRISTIAAN STREBECK is a 75 y.o. male with a hx of atrial fibrillation/flutter s/p ablation 01/2014, CAD s/p PCI to LAD 0/2725, chronic diastolic heart failure, HLD, ILD, HTN last seen 01/31/21.  Prior atrial flutter ablation in 2006 and atrial fibrillation ablation 01/2014. March 2018 he had R/LHC with mild pulmonary hypertension, severe mRCA 95% (DES PCI), and prox to mid LAD 70% with FFR 0.75. He underwent staged DES to LAD 07/2016. He was seen 09/2020 noting exertional dyspnea similar to anginal equivalent. Echo 09/29/20 LVEF >75%, mild LVH, diastolic dysfunction, mild AI. He underwent subequent L/RHC with mid to distal LAD 70% treated with DES (overlapping prior stent distally and crossing D2). RHC and cardiac output were normal with no pulmonary hypertension.   Previous intolerance to beta blocker with fatigue. Seen 01/31/21 noting palpitations, dyspnea. Toprol 12.5mg  QD was started after ZIO monitor showed PAC burden of 6.1% (no recurrent atrial fibrillation).  Very active in the social community serving on [employer] in [place]. Does note recent stressors related to his military history and some nightmares and had started Minipress per [place].Myoview 02/09/21 low risk study.  Since last seen he has been diagnosed with kidney stones with plans to pass as they are small. He has had to stop Eliquis due to hematuria. He saw Dr. Elsworth Soho of pulmonology 03/19/21 with increasing dyspnea unclear whether etiology ILD versus cardiac issues. He was unable to perform PFT. High contrast chest CT ordered and recommended to increase Prednisone to 10mg  QD for 14 days then return to 5mg .  Presents today for follow up today with his wife. Notes still with pain from kidney stones and has been unable to pass. He is not taking Eliquis due to hematuria which is known by our pharmacy team. Wife is very concerned about weakness, fatigue. Wonders whether needs alternate plan for kidney stones. Encouraged to discuss with urology. He is predominantly concerned with shortness of breath with minimal exertion. Notes some episodes of irregular heart beats despite Toprol 12.5mg  QD. He has been taking Prednisone 5mg  of Prednisone as was unaware of recommendations from Dr. Elsworth Soho. He is agreeable to transition to 10mg  daily for 14 days as previously recommended. No edema, orthopnea, PND, chest pain.   EKGs/Labs/Other Studies Reviewed:   The following studies were reviewed today:  ZIO 02/14/21 Predominant underlying rhythm Sinus Rhythm: Average heart rate 83 bpm, heart rate range 56-146 bpm. Frequent PACs noted (6.1%); rare isolated PVCs. Multiple-364 episodes of PSVT/PAT (Paroxysmal Supraventricular or Atrial Tachycardia) noted. Longest lasting history of some 23 seconds with an average rate of 95 bpm. Fastest interval was 5 beats at a maximum rate of 231 bpm. Diary did not note any triggers for symptoms. No evidence of atrial fibrillation, atrial flutter or prolonged sustained SVT or PAT   Monitor shows mostly sinus rhythm but does have  lots of premature atrial beats quite a few short lived runs of paroxysmal atrial/supraventricular tachycardia -> nonsustained.  Longest episode is only 23 seconds.  Not likely enough to cause it.   Best option to Rx PACs is Beta Blocker.   Myoview 02/09/21 The study is normal. Findings are consistent with no prior ischemia and no prior myocardial infarction. The study is low risk.   No ST deviation was noted.   LV perfusion is normal. There is no evidence of ischemia. There is no evidence of infarction.   Left ventricular function is normal. Nuclear stress EF: 61 %. The left ventricular ejection fraction is normal (55-65%). End diastolic cavity size is normal. End systolic cavity size is normal.   Prior study available for comparison from 10/22/2016. No changes compared to prior study.   Echo 09/29/2020:  EF > 75%, Hyperdynamic. No RWMA. Mild LVH. ~ DD with mild LA dilation. Mild AI. Normal RV size & fxn, ? Dilated IVC c/w CVP ~ 15 mHg.   R&LHC-PCI 10/11/2020: Patent DES in mRCA & p-mLAD. CULPRIT = m-d LAD 70% (just after D1 w/ Ost 60%) FFR 0.75 =>  DES PCI m-dLAD & PTCA ost D1 (overlapping prior stent distally & crossing D2) SYNERGY DES 2.5 x 32 -> tapered post-dilation 3.7 to 2.6 (reduced to 0% residual), Ost D2 post PTCA 40%.   RIGHT HEART CATH NUMBERS ALONG WITH CARDIAC OUTPUT AND INDEX ARE NORMAL. NO PULMONARY HYPERTENSION Diagnostic                                                   Intervention   Right Heart Pressures PAP-mean: 29/10 mmHg - 18 mmHg PCWP 13 mmHg  AoP 127/67 mmHg- MAP 89 mmH Ao sat 100%, PA sat 75%. Cardiac Output/Index by Fick: 7.68/3.34.  Right Atrium RAP mean 5 mmHg  Right Ventricle RVP-EDP 30/1 mmHg - 6 mmHg       EKG:  No EKG today. EKG independently reviewed from 01/25/21 with SR 72 bpm with occasional PAC. There were no acute ST/T wave chnages.  Recent Labs: 09/12/2020: Pro B Natriuretic peptide (BNP) 82.0 10/05/2020: ALT 26 01/31/2021: TSH 1.360 02/01/2021: B  Natriuretic Peptide 19.3 02/05/2021: BUN 15; Creatinine, Ser 1.16; Hemoglobin 11.4; Platelets 226; Potassium 4.0; Sodium 139  Recent Lipid Panel    Component Value Date/Time   CHOL 165 10/05/2020 1010   TRIG 259 (H) 10/05/2020 1010   HDL 53 10/05/2020 1010   CHOLHDL 3.1 10/05/2020 1010   CHOLHDL 9.5 08/17/2009 0444   VLDL UNABLE TO CALCULATE IF TRIGLYCERIDE OVER 400 mg/dL 08/17/2009 0444   LDLCALC 70 10/05/2020 1010    Risk Assessment/Calculations:   CHA2DS2-VASc Score = 5   This indicates a 7.2% annual risk of stroke. The patient's score is based upon: CHF History: 0 HTN History: 1 Diabetes History: 1 Stroke History: 0 Vascular Disease History: 1 Age Score: 2 Gender Score: 0   Home Medications   Current Meds  Medication Sig   acetaminophen (TYLENOL) 500 MG tablet Take 1,000 mg by mouth every 6 (six) hours as needed for mild pain or moderate pain.   Adalimumab 40 MG/0.4ML PSKT Inject 40 mg into the skin every 14 (fourteen) days.    albuterol (VENTOLIN HFA) 108 (90 Base) MCG/ACT inhaler Inhale 1-2 puffs into the lungs every 6 (six) hours as needed.   calcipotriene (DOVONOX) 0.005 % cream Apply 1 application topically 2 (two) times daily as needed (psoriasis).   Calcium Carbonate-Vit D-Min (CALCIUM 1200 PO) Take 1,200 mg by mouth daily.   cetirizine (ZYRTEC) 10 MG tablet Take 10 mg by mouth daily.   clobetasol cream (TEMOVATE) 8.29 % Apply 1 application topically 2 (two) times daily as needed (for psoriasis).   clopidogrel (PLAVIX) 75 MG tablet Take 1 tablet (75 mg total) by mouth daily.   diltiazem (CARTIA XT) 240 MG 24 hr capsule Take 1 capsule (240 mg total) by mouth daily.   ferrous sulfate 325 (65 FE) MG tablet Take 325 mg by mouth daily.   gabapentin (NEURONTIN) 300 MG capsule Take 300-600 mg by mouth See admin instructions. Take 300 mg in the morning and 600 mg in th evening   galantamine (RAZADYNE ER) 8 MG 24 hr capsule Take 16 mg by mouth daily with breakfast.    HYDROcodone-acetaminophen (NORCO/VICODIN) 5-325 MG tablet Take 1-2 tablets by mouth every 6 (six) hours as needed.   memantine (NAMENDA) 10 MG tablet Take 20 mg by mouth at bedtime.   metFORMIN (GLUCOPHAGE-XR) 500 MG 24 hr tablet Take 500 mg by mouth at bedtime.   metoprolol succinate (TOPROL XL) 25 MG 24 hr tablet Take 0.5 tablets (12.5 mg total) by mouth daily.   Polyethyl Glycol-Propyl Glycol (SYSTANE OP) Place 1 drop into both eyes daily as needed (Dry eye).   prazosin (MINIPRESS) 1 MG capsule TAKE ONE CAPSULE BY MOUTH AT BEDTIME FOR 1 MONTH, THEN TAKE TWO CAPSULES AT BEDTIME FOR 1 MONTH, THEN TAKE THREE CAPSULES AT BEDTIME - NEW, TO REDUCE NIGHTMARES   predniSONE (DELTASONE) 10 MG tablet Take 1 tablet (10 mg total) by mouth daily with breakfast.   predniSONE (DELTASONE) 5 MG tablet Take 1 tablet by mouth once daily with breakfast (Patient taking differently: Take 1 tablet by mouth once daily with breakfast as needed)   tamsulosin (FLOMAX) 0.4 MG CAPS capsule Take 1 capsule by mouth once daily     Review of Systems      All other systems reviewed and are otherwise negative except as noted above.  Physical Exam    VS:  BP 124/72  Pulse 70    Ht 6\' 3"  (1.905 m)    Wt 224 lb (101.6 kg)    SpO2 99%    BMI 28.00 kg/m  , BMI Body mass index is 28 kg/m.  Wt Readings from Last 3 Encounters:  03/27/21 224 lb (101.6 kg)  03/19/21 222 lb (100.7 kg)  02/09/21 234 lb (106.1 kg)    GEN: Well nourished, well developed, in no acute distress. HEENT: normal. Neck: Supple, no JVD, carotid bruits, or masses. Cardiac: RRR, no  rubs, or gallops. Gr 3/6 systolic murmur, No clubbing, cyanosis, edema.  Radials/PT 2+ and equal bilaterally.  Respiratory:  Respirations regular and unlabored, clear to auscultation bilaterally. GI: Soft, nontender, nondistended. MS: No deformity or atrophy. Skin: Warm and dry, no rash. Neuro:  Strength and sensation are intact. Psych: Normal affect.  Assessment & Plan     PAF / PAC / Chronic anticoagulation - SR by auscultation today. EKG 01/25/21 at atrial fib clinic with NSR with PAC and no acute ST/T wave changes. Monitor10/2022 with frequent PAC 6.1% and no recurrent atrial fib. Continue current dose Diltiazem. Given increased palpitations increase Toprol 25mg  QD. No significant change in fatigue since addition of Toprol. Eliquis on hold due to recurrent hematuria with kidney stone. Upcoming appt with urology to discuss again. . CHA2DS2-VASc Score = 5 [CHF History: 0, HTN History: 1, Diabetes History: 1, Stroke History: 0, Vascular Disease History: 1, Age Score: 2, Gender Score: 0].  Therefore, the patient's annual risk of stroke is 7.2 %.     CAD - New exertional dyspnea. Myoview 02/09/21 low risk with no evidence of ischemia.  GDMT includes Plavix, Rosuvastatin. Heart healthy diet and regular cardiovascular exercise encouraged.  Plan to update echo, as below. If no improvement in dyspnea despite increased Prednisone dose and if echo unrevealing could consider R/LHC. Will Rx PRN nitroglycerin, education on how to use provided.   ILD - Increase Prednisone to 10mg  QD x 14 days then 5mg  QD per pulmonology previous recommendations.   Diastolic dysfunction - Euvolemic on exam. LHC 10/2020 normal LVEDP, no pulmonary hypertension. 02/2021 stress test low risk. Given worsening exertional dyspnea, plan to update echocardiogram.  Continue low salt diet. No indication for diuretic at this time.   HLD - Continue Rosuvastatin.   HTN - BP well controlled. Continue current antihypertensive regimen.    Disposition: Follow up in 3 months with Dr. Ellyn Hack or APP.  Signed, Loel Dubonnet, NP 03/27/2021, 10:01 AM [place]

## 2021-03-28 NOTE — Progress Notes (Signed)
Kenneth [name] - he is one of my favorite patients -- very interesting past --[place] -> [facility] -- kind like a real life [name].  [name], MD

## 2021-03-30 DIAGNOSIS — N201 Calculus of ureter: Secondary | ICD-10-CM | POA: Diagnosis not present

## 2021-04-04 ENCOUNTER — Ambulatory Visit (HOSPITAL_COMMUNITY)
Admission: RE | Admit: 2021-04-04 | Discharge: 2021-04-04 | Disposition: A | Discharge status: Home / Self Care | Payer: Medicare Other | Source: Ambulatory Visit | Attending: Pulmonary Disease | Admitting: Pulmonary Disease

## 2021-04-04 ENCOUNTER — Encounter (HOSPITAL_COMMUNITY): Payer: Self-pay

## 2021-04-04 ENCOUNTER — Encounter (INDEPENDENT_AMBULATORY_CARE_PROVIDER_SITE_OTHER): Payer: Self-pay

## 2021-04-04 ENCOUNTER — Other Ambulatory Visit: Payer: Self-pay

## 2021-04-04 DIAGNOSIS — R918 Other nonspecific abnormal finding of lung field: Secondary | ICD-10-CM | POA: Diagnosis not present

## 2021-04-04 DIAGNOSIS — R0602 Shortness of breath: Secondary | ICD-10-CM | POA: Insufficient documentation

## 2021-04-04 DIAGNOSIS — J849 Interstitial pulmonary disease, unspecified: Secondary | ICD-10-CM | POA: Diagnosis not present

## 2021-04-04 DIAGNOSIS — I700 Atherosclerosis of aorta: Secondary | ICD-10-CM | POA: Diagnosis not present

## 2021-04-04 DIAGNOSIS — R911 Solitary pulmonary nodule: Secondary | ICD-10-CM | POA: Diagnosis not present

## 2021-04-04 DIAGNOSIS — I7121 Aneurysm of the ascending aorta, without rupture: Secondary | ICD-10-CM | POA: Diagnosis not present

## 2021-04-04 DIAGNOSIS — J439 Emphysema, unspecified: Secondary | ICD-10-CM | POA: Diagnosis not present

## 2021-04-06 NOTE — Progress Notes (Signed)
Spoke with pt and notified of results per Dr. Alva. Pt verbalized understanding and denied any questions. 

## 2021-04-11 ENCOUNTER — Ambulatory Visit: Payer: Medicare Other | Admitting: Cardiology

## 2021-04-12 ENCOUNTER — Other Ambulatory Visit: Payer: Self-pay

## 2021-04-12 ENCOUNTER — Ambulatory Visit (HOSPITAL_COMMUNITY): Payer: Medicare Other | Attending: Internal Medicine

## 2021-04-12 DIAGNOSIS — R0609 Other forms of dyspnea: Secondary | ICD-10-CM | POA: Diagnosis not present

## 2021-04-12 LAB — ECHOCARDIOGRAM COMPLETE
AR max vel: 1.48 cm2
AV Area VTI: 1.68 cm2
AV Area mean vel: 1.58 cm2
AV Mean grad: 26 mmHg
AV Peak grad: 46.2 mmHg
Ao pk vel: 3.4 m/s
Area-P 1/2: 2.19 cm2
P 1/2 time: 553 msec
S' Lateral: 2.3 cm

## 2021-04-12 MED ORDER — FUROSEMIDE 20 MG PO TABS: 20.0000 mg | ORAL_TABLET | Freq: Every day | ORAL | 3 refills | Status: DC

## 2021-04-17 DIAGNOSIS — N5201 Erectile dysfunction due to arterial insufficiency: Secondary | ICD-10-CM | POA: Diagnosis not present

## 2021-04-17 DIAGNOSIS — N201 Calculus of ureter: Secondary | ICD-10-CM | POA: Diagnosis not present

## 2021-05-01 ENCOUNTER — Other Ambulatory Visit: Payer: Self-pay | Admitting: Nurse Practitioner

## 2021-05-01 DIAGNOSIS — R0609 Other forms of dyspnea: Secondary | ICD-10-CM | POA: Diagnosis not present

## 2021-05-01 DIAGNOSIS — H35033 Hypertensive retinopathy, bilateral: Secondary | ICD-10-CM | POA: Diagnosis not present

## 2021-05-01 DIAGNOSIS — H5203 Hypermetropia, bilateral: Secondary | ICD-10-CM | POA: Diagnosis not present

## 2021-05-01 DIAGNOSIS — H43392 Other vitreous opacities, left eye: Secondary | ICD-10-CM | POA: Diagnosis not present

## 2021-05-01 DIAGNOSIS — H35373 Puckering of macula, bilateral: Secondary | ICD-10-CM | POA: Diagnosis not present

## 2021-05-01 DIAGNOSIS — H52203 Unspecified astigmatism, bilateral: Secondary | ICD-10-CM | POA: Diagnosis not present

## 2021-05-01 DIAGNOSIS — H43811 Vitreous degeneration, right eye: Secondary | ICD-10-CM | POA: Diagnosis not present

## 2021-05-01 DIAGNOSIS — H35342 Macular cyst, hole, or pseudohole, left eye: Secondary | ICD-10-CM | POA: Diagnosis not present

## 2021-05-01 DIAGNOSIS — H04123 Dry eye syndrome of bilateral lacrimal glands: Secondary | ICD-10-CM | POA: Diagnosis not present

## 2021-05-01 DIAGNOSIS — Z7984 Long term (current) use of oral hypoglycemic drugs: Secondary | ICD-10-CM | POA: Diagnosis not present

## 2021-05-01 DIAGNOSIS — E119 Type 2 diabetes mellitus without complications: Secondary | ICD-10-CM | POA: Diagnosis not present

## 2021-05-01 DIAGNOSIS — H524 Presbyopia: Secondary | ICD-10-CM | POA: Diagnosis not present

## 2021-05-02 LAB — BASIC METABOLIC PANEL
BUN/Creatinine Ratio: 13 (ref 10–24)
BUN: 14 mg/dL (ref 8–27)
CO2: 21 mmol/L (ref 20–29)
Calcium: 9.3 mg/dL (ref 8.6–10.2)
Chloride: 103 mmol/L (ref 96–106)
Creatinine, Ser: 1.05 mg/dL (ref 0.76–1.27)
Glucose: 225 mg/dL — ABNORMAL HIGH (ref 70–99)
Potassium: 3.7 mmol/L (ref 3.5–5.2)
Sodium: 142 mmol/L (ref 134–144)
eGFR: 74 mL/min/{1.73_m2} (ref >59)

## 2021-05-02 LAB — BRAIN NATRIURETIC PEPTIDE: BNP: 34 pg/mL (ref 0.0–100.0)

## 2021-05-03 ENCOUNTER — Encounter (HOSPITAL_BASED_OUTPATIENT_CLINIC_OR_DEPARTMENT_OTHER): Payer: Self-pay

## 2021-05-03 ENCOUNTER — Telehealth (HOSPITAL_BASED_OUTPATIENT_CLINIC_OR_DEPARTMENT_OTHER): Payer: Self-pay

## 2021-05-03 NOTE — Telephone Encounter (Addendum)
Results called to patient and appointment reminder mailed to patient!     ----- Message from Loel Dubonnet, NP sent at 05/03/2021 10:25 AM EST ----- Lab work shows normal kidney function and electrolytes. No evidence of significant volume overload. Good result! Continue current medications and follow up as scheduled.

## 2021-05-10 ENCOUNTER — Other Ambulatory Visit: Payer: Self-pay

## 2021-05-10 ENCOUNTER — Ambulatory Visit (INDEPENDENT_AMBULATORY_CARE_PROVIDER_SITE_OTHER): Payer: Medicare Other | Admitting: Family

## 2021-05-10 ENCOUNTER — Encounter (HOSPITAL_BASED_OUTPATIENT_CLINIC_OR_DEPARTMENT_OTHER): Payer: Self-pay | Admitting: Family

## 2021-05-10 VITALS — BP 138/78 | HR 88 | Ht 75.5 in | Wt 237.1 lb

## 2021-05-10 DIAGNOSIS — D6859 Other primary thrombophilia: Secondary | ICD-10-CM

## 2021-05-10 DIAGNOSIS — J849 Interstitial pulmonary disease, unspecified: Secondary | ICD-10-CM

## 2021-05-10 DIAGNOSIS — I272 Pulmonary hypertension, unspecified: Secondary | ICD-10-CM | POA: Diagnosis not present

## 2021-05-10 DIAGNOSIS — I10 Essential (primary) hypertension: Secondary | ICD-10-CM | POA: Diagnosis not present

## 2021-05-10 DIAGNOSIS — E1142 Type 2 diabetes mellitus with diabetic polyneuropathy: Secondary | ICD-10-CM

## 2021-05-10 DIAGNOSIS — I5032 Chronic diastolic (congestive) heart failure: Secondary | ICD-10-CM

## 2021-05-10 DIAGNOSIS — E782 Mixed hyperlipidemia: Secondary | ICD-10-CM | POA: Diagnosis not present

## 2021-05-10 DIAGNOSIS — I350 Nonrheumatic aortic (valve) stenosis: Secondary | ICD-10-CM | POA: Diagnosis not present

## 2021-05-10 DIAGNOSIS — I480 Paroxysmal atrial fibrillation: Secondary | ICD-10-CM

## 2021-05-10 MED ORDER — DAPAGLIFLOZIN PROPANEDIOL 10 MG PO TABS: 10.0000 mg | ORAL_TABLET | Freq: Every day | ORAL | 0 refills | Status: DC

## 2021-05-10 MED ORDER — FUROSEMIDE 20 MG PO TABS: 20.0000 mg | ORAL_TABLET | Freq: Every day | ORAL | 3 refills | Status: DC

## 2021-05-10 MED ORDER — DAPAGLIFLOZIN PROPANEDIOL 10 MG PO TABS: 10.0000 mg | ORAL_TABLET | Freq: Every day | ORAL | 1 refills | Status: DC

## 2021-05-10 NOTE — Progress Notes (Signed)
Office Visit    Patient Name: Kenneth [name] Date of Encounter: 05/10/2021  PCP:  Landry Mellow, MD   [facility]  Cardiologist:  [name], MD  Advanced Practice Provider:  No care team member to display Electrophysiologist:  None    Chief Complaint    Kenneth [name] is a 76 y.o. male with a hx of atrial fibrillation/flutter s/p ablation 01/2014, CAD s/p PCI to LAD 0/2585, chronic diastolic heart failure, HLD presents today for follow up after echo.  Past Medical History    Past Medical History:  Diagnosis Date   Arthritis    CAD S/P percutaneous coronary angioplasty 06/28/2016   Cardiologist- Dr. Ellyn Hack: 06/28/16 -- [facility] PCI (Synergy DES 2.75 x 20 mm); 07/10/16: Staged p-m LAD PCI (FFR 0.75) - DES PCI (Synergy DES 3.5 x 20 mm); 10/11/2020: mid-distal LAD 70% (FFR 0.75) DES PCI (Synergy DES 2.5 mm x 32 mm - overlaps prox-mid stent, tapered post-dilation 3.7 - 2.6 mm.   Chronic diastolic heart failure (HCC)    (Cardiologist: Dr. Ellyn Hack): EF > 75% / Hyperdynamic LV Fxn - indeterminate Diastolic Fxn.   Chronic dryness of both eyes    Diverticulosis of colon    Epiretinal membrane    "epiretinal attachment" (06/28/2016) 10-14-2017 per pt oringinally in both , now only unilateral    GERD (gastroesophageal reflux disease)    Hiatal hernia    History of adenomatous polyp of colon    History of concussion 08/23/2017   w/o loc--- per pt no residual   History of kidney stones    History of rheumatic fever 1958   NO Evidence of Vavlular disease -> only mild Aortic Sclerosis   History of skin cancer    excision leg;  froze the left arm--- unsure BCC or SCC    Hyperlipidemia    Hypertension    ILD (interstitial lung disease) ([place])    pulmologist-  dr Elsworth Soho--  secondary to methotrexate use --- last PFTs 04-25-2015  mild restriction   Iron deficiency anemia    Mild obstructive sleep apnea    10-04-2019 per pt  had a sleep study years ago , was told no  cpap recommended pt denies    Nephrolithiasis    CT 09-07-2019 bilateral nonobstructive stones   Paroxysmal atrial fibrillation [facility]) cardiologist-  dr harding/  EP-- dr Rayann Heman   a. 11/2003 Tikosyn initiated - subsequently d/c'd;  b. 2006 RFCA for Afib @ [facility];  c. 09/2011 Echo: EF 60-65%, Gr 2 DD;  d. DCCV 10/2011 , 04/2012, 12/ 2014, & 02/ 2015;  e. RFCA 01-13-2014   Plaque psoriasis 1969   followed by rheumatologist  and dermatologist @ [facility] in [place]   Right ureteral stone    S/P drug eluting coronary stent placement    06-28-2016  x1 to mRCA;  07-10-2016 x1 to mLAD   Seasonal allergies    Type 2 diabetes mellitus ([place])    followed by pcp   Ureteral calculus, right    Wears hearing aid in both ears    Past Surgical History:  Procedure Laterality Date   [place] N/A 01/13/2014   repeat PVI, also left atrial ablation performed with successfull ablation of LA flutter by Dr Rayann Heman   ATRIAL FIBRILLATION ABLATION  08/2004   Dr Rolland Porter at [facility] , [place])   [place] N/A 05/22/2013   Procedure: CARDIOVERSION;  Surgeon: [name], MD;  Location: [place];  Service: Cardiovascular;  Laterality: N/A;   CARDIOVERSION N/A 10/23/2011   Procedure: CARDIOVERSION;  Surgeon: Deboraha Sprang, MD;  Location: [facility] CATH LAB;  Service: Cardiovascular;  Laterality: N/A;   CARDIOVERSION N/A 05/01/2012   Procedure: CARDIOVERSION;  Surgeon: Deboraha Sprang, MD;  Location: [facility] CATH LAB;  Service: Cardiovascular;  Laterality: N/A;   CARPAL TUNNEL RELEASE Right 1980s   CATARACT EXTRACTION W/ INTRAOCULAR LENS  IMPLANT, BILATERAL  2012  approx.   CORONARY STENT INTERVENTION N/A 06/28/2016   Procedure: Coronary Stent Intervention;  Surgeon: Leonie Man, MD;  Location: [place] CV LAB;  Service: Cardiovascular;  Laterality: mRCA PCI -Synergy DES 2.75 m x 20 mm (3.1 mm)   CORONARY STENT INTERVENTION N/A 07/10/2016   Procedure: Coronary Stent Intervention;   Surgeon: Leonie Man, MD;  Location: [place] CV LAB;  Service: Cardiovascular: mLAD PCI Synergy DES 3.5 mm x 20 mm)   CORONARY STENT INTERVENTION N/A 10/11/2020   Procedure: CORONARY STENT INTERVENTION;  Surgeon: Leonie Man, MD;  Location: [place] CV LAB;  Service: Cardiovascular: mid-distal LAD ~70% (FFR 0.75): SYNERGY DES 2.5 MM X 32 MM (overlaps prox-mid Stent from 07/2016) - Tapered post-dilation 3.7 mm ->2.6 mm; Jailed D2 PTCA reduced 60% to 40%.   CYSTOSCOPY WITH RETROGRADE PYELOGRAM, URETEROSCOPY AND STENT PLACEMENT Right 10/16/2017   Procedure: CYSTOSCOPY WITH RIGHT RETROGRADE RIGHT URETEROSCOPY AND STONE BASKET EXTRACTION;  Surgeon: Irine Seal, MD;  Location: [facility];  Service: Urology;  Laterality: Right;   CYSTOSCOPY WITH RETROGRADE PYELOGRAM, URETEROSCOPY AND STENT PLACEMENT Right 10/08/2019   Procedure: CYSTOSCOPY WITH URETEROSCOPY; RETROGRADE PYELOGRAM; STONE BASKET EXTRACTION;  STENT PLACEMENT;  Surgeon: Cleon Gustin, MD;  Location: [facility];  Service: Urology;  Laterality: Right;  1 HR   CYSTOSCOPY WITH RETROGRADE PYELOGRAM, URETEROSCOPY AND STENT PLACEMENT Right 03/28/2020   Procedure: CYSTOSCOPY WITH RIGHT RETROGRADE PYELOGRAM, URETEROSCOPY WITH HOLMIUM LASER AND STENT PLACEMENT;  Surgeon: Irine Seal, MD;  Location: [facility];  Service: Urology;  Laterality: Right;   CYSTOSCOPY/RETROGRADE/URETEROSCOPY/STONE EXTRACTION WITH BASKET Right 10/27/2017   Procedure: CYSTOSCOPY/RETROGRADE/URETEROSCOPY/STONE EXTRACTION WITH BASKET/URETERAL STENT PLACEMENT and laser;  Surgeon: Irine Seal, MD;  Location: [facility];  Service: Urology;  Laterality: Right;   EXTRACORPOREAL SHOCK WAVE LITHOTRIPSY Left 08/10/2020   Procedure: EXTRACORPOREAL SHOCK WAVE LITHOTRIPSY (ESWL);  Surgeon: Ardis Hughs, MD;  Location: [facility];  Service: Urology;  Laterality: Left;   HOLMIUM LASER APPLICATION Right 52/84/1324   Procedure:  POSSIBLE HOLMIUM LASER APPLICATION;  Surgeon: Irine Seal, MD;  Location: [facility];  Service: Urology;  Laterality: Right;   HOLMIUM LASER APPLICATION Right 40/01/2724   Procedure: HOLMIUM LASER APPLICATION;  Surgeon: Cleon Gustin, MD;  Location: [facility];  Service: Urology;  Laterality: Right;   INTRAVASCULAR PRESSURE WIRE/FFR STUDY N/A 06/28/2016   Procedure: Intravascular Pressure Wire/FFR Study;  Surgeon: Leonie Man, MD;  Location: [place] CV LAB;  Service: Cardiovascular: FFR of mLAD ~65% lesion = 0.75 post --> Staged PCI   INTRAVASCULAR PRESSURE WIRE/FFR STUDY N/A 10/11/2020   Procedure: INTRAVASCULAR PRESSURE WIRE/FFR STUDY;  Surgeon: Leonie Man, MD;  Location: [place] CV LAB;  Service: Cardiovascular;  Laterality: LAD mid-distal just after D2 --> FFR 0.75 SIGNIFICANT => PCI   LEFT HEART CATH AND CORONARY ANGIOGRAPHY  06/30/2003   Dr. Lia Foyer: NO Significant/Obstructive CAD; Preseved LVEF (for Recurrent Afib)   NASAL SINUS SURGERY     x 2   PROXIMAL INTERPHALANGEAL FUSION (PIP) Left  01-05-2001    dr graves   correction clawtoe and extensor tendon lengthening   RIGHT/LEFT HEART CATH AND CORONARY ANGIOGRAPHY N/A 06/28/2016   Procedure: Right/Left Heart Cath and Coronary Angiography;  Surgeon: Leonie Man, MD;  Location: [place] CV LAB;  Service: Cardiovascular: mRCA 99% (TIMI 2), mLAD ~65% (FFR 0.75), OM3 55%. mild Pulm HTN. Mod LVEDP elevation. EF 55-65%.   RIGHT/LEFT HEART CATH AND CORONARY ANGIOGRAPHY N/A 10/11/2020   Procedure: RIGHT/LEFT HEART CATH AND CORONARY ANGIOGRAPHY;  Surgeon: Leonie Man, MD;  Location: [place] CV LAB;; Patent DES in [place] & p-mLAD. CULPRIT = m-d LAD 70% (just after D1 w/ Ost 60%) FFR 0.75 => DES PCI m-dLAD & PTCA ost D1 (overlapping prior stent distally & crossing D2 w/  Ost D2 post PTCA). RIGHT HEART CATH PRESSURES,  & CO-CI ARE NORMAL. NO PULM HTN   ROTATOR CUFF REPAIR Bilateral last one 2014    TONSILLECTOMY  age 39   TRANSTHORACIC ECHOCARDIOGRAM  09/29/2020   EF > 75%, Hyperdynamic. No RWMA. Mild LVH. ~ DD with mild LA dilation. Mild AI. Normal RV size & fxn, ? Dilated IVC c/w CVP ~ 15 mHg.   URETEROSCOPIC LASER LITHOTRIPSY STONE EXTRACTIONS/  STENT PLACEMENT Bilateral 09-10-2007   dr Jeffie Pollock  [facility]   per pt has had several prior ureteroscopic stone extraction since age 69 , last one 09-10-2007    Allergies  Allergies  Allergen Reactions   Hydrocodone Shortness Of Breath    In combination with decongestants   Other Palpitations and Other (See Comments)    ALL DECONGESTANTS - CAUSE PALPITATIONS; THROWS HEART RHYTHM OUT OF BALANCE   Oxycodone Shortness Of Breath   Pseudoephedrine Palpitations   Tramadol Shortness Of Breath and Rash    flushing   Gemfibrozil Other (See Comments)    Dizziness      History of Present Illness    Kenneth [name] is a 76 y.o. male with a hx of atrial fibrillation/flutter s/p ablation 01/2014, CAD s/p PCI to LAD 09/3873, chronic diastolic heart failure, HLD, ILD, HTN last seen 03/09/2009  Prior atrial flutter ablation in 2006 and atrial fibrillation ablation 01/2014. March 2018 he had R/LHC with mild pulmonary hypertension, severe mRCA 95% (DES PCI), and prox to mid LAD 70% with FFR 0.75. He underwent staged DES to LAD 07/2016. He was seen 09/2020 noting exertional dyspnea similar to anginal equivalent. Echo 09/29/20 LVEF >75%, mild LVH, diastolic dysfunction, mild AI. He underwent subequent L/RHC with mid to distal LAD 70% treated with DES (overlapping prior stent distally and crossing D2). RHC and cardiac output were normal with no pulmonary hypertension.   Previous intolerance to beta blocker with fatigue. Seen 01/31/21 noting palpitations, dyspnea. Toprol 12.5mg  QD was started after ZIO monitor showed PAC burden of 6.1% (no recurrent atrial fibrillation).  Very active in the social community serving on [employer] in [place]. Does note recent stressors related to his military history and some nightmares and had started [employer]ip[employer] per [place].Myoview 02/09/21 low risk study.  He was seen 03/27/2021 noting persistent dyspnea.  He was having difficulties with kidney stones and had stopped Eliquis due to hematuria.  He had seen Dr. Elsworth Soho due to dyspnea and recommended to increase prednisone to 10 mg for 14 days then return to 5 mg.  He was unaware of these recommendations and they were reiterated at cardiology visit.  Toprol was increased to 25 mg daily due to palpitations.  Echocardiogram was ordered.  Echo 04/12/2021 LVEF 60 to 28%, grade 2 diastolic dysfunction, mildly elevated PASP, mild MR, mild aortic stenosis, mild dilation of aortic root 40 mm and ascending aorta 41 mm.  He was started on Lasix 20 mg daily.  He did visit the office last week to thank [place] for helping him to feel so much better.  He presents today for follow-up with his wife.  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  He notes over the last week he has started to feel more short of breath.  His weight today is up 13 pounds compared to 6 weeks prior.  He notes decreased stamina.  Drinks 2 L of fluid or greater per day as he reports dry mouth.  EKGs/Labs/Other Studies Reviewed:   The following studies were reviewed today:  04/12/21 Echocardiogram  1. Left ventricular ejection fraction, by estimation, is 60 to 65%. Left  ventricular ejection fraction by 3D volume is 57 %. The left ventricle has  normal function. The left ventricle has no regional wall motion  abnormalities. There is mild left  ventricular hypertrophy. Left ventricular diastolic parameters are  consistent with Grade II diastolic dysfunction (pseudonormalization).  Elevated left ventricular end-diastolic pressure. The E/e' is 23.   2. Right ventricular systolic function is normal. The right ventricular  size is normal. There is mildly elevated  pulmonary artery systolic  pressure. The estimated right ventricular systolic pressure is 36.6 mmHg.   3. The mitral valve is abnormal. Mild mitral valve regurgitation.   4. The aortic valve is tricuspid. There is mild calcification of the  aortic valve. Aortic valve regurgitation is mild. Mild aortic valve  stenosis. Aortic regurgitation PHT measures 553 msec. Aortic valve area,  by VTI measures 1.68 cm. Aortic valve mean   gradient measures 26.0 mmHg. DI is 0.32.   5. Aortic dilatation noted. There is mild dilatation of the aortic root,  measuring 40 mm. There is mild dilatation of the ascending aorta,  measuring 41 mm.   Comparison(s): Changes from prior study are noted. 09/29/2020: LVEF >75%,  mild AI.   ZIO 02/14/21 Predominant underlying rhythm Sinus Rhythm: Average heart rate 83 bpm, heart rate range 56-146 bpm. Frequent PACs noted (6.1%); rare isolated PVCs. Multiple-364 episodes of PSVT/PAT (Paroxysmal Supraventricular or Atrial Tachycardia) noted. Longest lasting history of some 23 seconds with an average rate of 95 bpm. Fastest interval was 5 beats at a maximum rate of 231 bpm. Diary did not note any triggers for symptoms. No evidence of atrial fibrillation, atrial flutter or prolonged sustained SVT or PAT   Monitor shows mostly sinus rhythm but does have lots of premature atrial beats quite a few short lived runs of paroxysmal atrial/supraventricular tachycardia -> nonsustained.  Longest episode is only 23 seconds.  Not likely enough to cause it.   Best option to Rx PACs is Beta Blocker.   Myoview 02/09/21 The study is normal. Findings are consistent with no prior ischemia and no prior myocardial infarction. The study is low risk.   No ST deviation was noted.   LV perfusion is normal. There is no evidence of ischemia. There is no evidence of infarction.   Left ventricular function is normal. Nuclear stress EF: 61 %. The left ventricular ejection fraction is normal (55-65%).  End diastolic cavity size is normal. End systolic cavity size is normal.   Prior study available for comparison from 10/22/2016. No changes compared to prior study.   Echo 09/29/2020:  EF > 75%, Hyperdynamic. No RWMA. Mild LVH. ~ DD with mild LA dilation. Mild AI. Normal RV size & fxn, ? Dilated IVC c/w CVP ~ 15 mHg.   R&LHC-PCI 10/11/2020: Patent DES in mRCA & p-mLAD. CULPRIT = m-d LAD 70% (just after D1 w/ Ost 60%) FFR 0.75 =>  DES PCI m-dLAD & PTCA ost D1 (overlapping prior stent distally & crossing D2) SYNERGY DES 2.5 x 32 -> tapered post-dilation 3.7 to 2.6 (reduced to 0% residual), Ost D2 post PTCA 40%.   RIGHT HEART CATH NUMBERS ALONG WITH CARDIAC OUTPUT AND INDEX ARE NORMAL. NO PULMONARY HYPERTENSION Diagnostic                                                   Intervention   Right Heart Pressures PAP-mean: 29/10 mmHg - 18 mmHg PCWP 13 mmHg  AoP 127/67 mmHg- MAP 89 mmH Ao sat 100%, PA sat 75%. Cardiac Output/Index by Fick: 7.68/3.34.  Right Atrium RAP mean 5 mmHg  Right Ventricle RVP-EDP 30/1 mmHg - 6 mmHg       EKG:  No EKG today. EKG independently reviewed from 01/25/21 with SR 72 bpm with occasional PAC. There were no acute ST/T wave chnages.   Recent Labs: 09/12/2020: Pro B Natriuretic peptide (BNP) 82.0 10/05/2020: ALT 26 01/31/2021: TSH 1.360 02/05/2021: Hemoglobin 11.4; Platelets 226 05/01/2021: BNP 34.0; BUN 14; Creatinine, Ser 1.05; Potassium 3.7; Sodium 142  Recent Lipid Panel    Component Value Date/Time   CHOL 165 10/05/2020 1010   TRIG 259 (H) 10/05/2020 1010   HDL 53 10/05/2020 1010   CHOLHDL 3.1 10/05/2020 1010   CHOLHDL 9.5 08/17/2009 0444   VLDL UNABLE TO CALCULATE IF TRIGLYCERIDE OVER 400 mg/dL 08/17/2009 0444   LDLCALC 70 10/05/2020 1010    Risk Assessment/Calculations:   CHA2DS2-VASc Score = 5   This indicates a 7.2% annual risk of stroke. The patient's score is based upon: CHF History: 0 HTN History: 1 Diabetes History: 1 Stroke History: 0 Vascular  Disease History: 1 Age Score: 2 Gender Score: 0   Home Medications   Current Meds  Medication Sig   acetaminophen (TYLENOL) 500 MG tablet Take 600 mg by mouth every 8 (eight) hours as needed (Arthritis).   Adalimumab 40 MG/0.4ML PSKT Inject 40 mg into the skin every 14 (fourteen) days.    albuterol (VENTOLIN HFA) 108 (90 Base) MCG/ACT inhaler Inhale 1-2 puffs into the lungs every 6 (six) hours as needed.   apixaban (ELIQUIS) 5 MG TABS tablet Take 1 tablet (5 mg total) by mouth 2 (two) times daily.   Calcium Carbonate-Vit D-Min (CALCIUM 1200 PO) Take 1,200 mg by mouth daily.   cetirizine (ZYRTEC) 10 MG tablet Take 10 mg by mouth daily.   clobetasol cream (TEMOVATE) 1.09 % Apply 1 application topically 2 (two) times daily as needed (for psoriasis).   clopidogrel (PLAVIX) 75 MG tablet Take 1 tablet (75 mg total) by mouth daily.   dapagliflozin propanediol (FARXIGA) 10 MG TABS tablet Take 1 tablet (10 mg total) by mouth daily before breakfast.   dapagliflozin propanediol (FARXIGA) 10 MG TABS tablet Take 1 tablet (10 mg total) by mouth daily before breakfast.   diltiazem (CARTIA XT) 240 MG 24 hr capsule Take 1 capsule (240 mg total) by mouth daily.   ENBREL SURECLICK 50 MG/ML injection SMARTSIG:1 Milliliter(s) SUB-Q Twice  a Week   ferrous sulfate 325 (65 FE) MG tablet Take 325 mg by mouth daily.   gabapentin (NEURONTIN) 300 MG capsule Take 300-600 mg by mouth See admin instructions. Take 300 mg in the morning and 600 mg in th evening   galantamine (RAZADYNE ER) 8 MG 24 hr capsule Take 16 mg by mouth daily with breakfast.   memantine (NAMENDA) 10 MG tablet Take 20 mg by mouth at bedtime.   metFORMIN (GLUCOPHAGE-XR) 500 MG 24 hr tablet Take 500 mg by mouth at bedtime.   metoprolol succinate (TOPROL XL) 25 MG 24 hr tablet Take 1 tablet (25 mg total) by mouth daily.   nitroGLYCERIN (NITROSTAT) 0.4 MG SL tablet Place 1 tablet (0.4 mg total) under the tongue every 5 (five) minutes as needed for chest  pain.   Polyethyl Glycol-Propyl Glycol (SYSTANE OP) Place 1 drop into both eyes daily as needed (Dry eye).   prazosin (MINIPRESS) 1 MG capsule TAKE ONE CAPSULE BY MOUTH AT BEDTIME FOR 1 MONTH, THEN TAKE TWO CAPSULES AT BEDTIME FOR 1 MONTH, THEN TAKE THREE CAPSULES AT BEDTIME - NEW, TO REDUCE NIGHTMARES   predniSONE (DELTASONE) 5 MG tablet Take 1 tablet by mouth once daily with breakfast (Patient taking differently: Take 1 tablet by mouth once daily with breakfast as needed)   tamsulosin (FLOMAX) 0.4 MG CAPS capsule Take 1 capsule by mouth once daily   [DISCONTINUED] furosemide (LASIX) 20 MG tablet Take 1 tablet (20 mg total) by mouth daily.   [DISCONTINUED] predniSONE (DELTASONE) 10 MG tablet Take 1 tablet (10 mg total) by mouth daily with breakfast.     Review of Systems      All other systems reviewed and are otherwise negative except as noted above.  Physical Exam    VS:  BP 138/78    Pulse 88    Ht 6' 3.5" (1.918 m)    Wt 237 lb 1.6 oz (107.5 kg)    BMI 29.24 kg/m  , BMI Body mass index is 29.24 kg/m.  Wt Readings from Last 3 Encounters:  05/10/21 237 lb 1.6 oz (107.5 kg)  03/27/21 224 lb (101.6 kg)  03/19/21 222 lb (100.7 kg)    GEN: Well nourished, well developed, in no acute distress. HEENT: normal. Neck: Supple, no JVD, carotid bruits, or masses. Cardiac: RRR, no  rubs, or gallops. Gr 3/6 systolic murmur, No clubbing, cyanosis, edema.  Radials/PT 2+ and equal bilaterally.  Respiratory:  Respirations regular and unlabored, clear to auscultation bilaterally. GI: Soft, nontender, nondistended. MS: No deformity or atrophy. Skin: Warm and dry, no rash. Neuro:  Strength and sensation are intact. Psych: Normal affect.  Assessment & Plan    PAF / PAC / Chronic anticoagulation - SR by auscultation today. EKG 01/25/21  NSR with PAC . Monitor10/2022 with frequent PAC 6.1% and no recurrent atrial fib. Continue current dose Diltiazem, Toprol, Eliquis. Does not meet dose reduction  criteria for Eliquis. Has resumed post passing kidney stone without recurrent hematuria. CHA2DS2-VASc Score = 5 [CHF History: 0, HTN History: 1, Diabetes History: 1, Stroke History: 0, Vascular Disease History: 1, Age Score: 2, Gender Score: 0].  Therefore, the patient's annual risk of stroke is 7.2 %.     Mild aortic stenosis -noted by echo 04/2021.  Continue optimal blood pressure and volume control.  Plan for repeat echo in 1 year for monitoring.  Mild aortic dilation -noted by echo 04/2021.  Continue optimal blood pressure control.  plan for repeat echo in 1 year.  Mild PAH -noted by echo 1/23. Continue Lasix.   CAD - Exertional dyspnea improved with Lasix but worsening over last week. No chest pain. Myoview 02/09/21 low risk with no evidence of ischemia.  GDMT includes Plavix, Rosuvastatin, PRN Nitroglycerin. Heart healthy diet and regular cardiovascular exercise encouraged.    ILD - continue to follow with pulmonology. On Prednisone 5mg  QD.  Diastolic heart failure - Weight up 13 pounds over 6 weeks. LHC 10/2020 normal LVEDP, no pulmonary hypertension. 02/2021 stress test low risk. Echo 04/2021 normal LVEF, gr2DD, mild PAH. Increase Lasix to 40mg  QD x 3 days then return to 20mg  QD. Start Farxiga 10mg  QD. Free 30 day coupon and samples provided. He anticipates it will be free at the [place]. Continue low salt diet, fluid restriction <2L encouraged. If no improvement with optimization of medical therapy may need to consider updating R/LHC.  HLD - Continue Rosuvastatin. No myalgias.   HTN - BP slightly elevated above goal of 130/80 in setting of volume overload. Increase Lasix, as detailed above. Future considerations include initiation of Entresto for dual HFpEF benefit vs alternat ARB.   DM2 - Continue to follow with PCP. Farxiga added for HF benefit, as above.   Disposition: Follow up in 3 months with Dr. Ellyn Hack or APP.  Signed, Loel Dubonnet, NP 05/10/2021, 5:23 PM [place]

## 2021-05-10 NOTE — Patient Instructions (Signed)
°  Medication Instructions:  Your physician has recommended you make the following change in your medication:   Recommend taking Furosemide 2 tablets (40mg ) once per day for 3 days, then return to 1 tablet (20mg ) daily. *You may take an additional 15m as needed for weight gai of 2 pounds overnight or 5 pounds in one week.  START Dapagliflozin Wilder Glade) one 10mg  tablet daily *this to help your heart work more efficiently  *If you need a refill on your cardiac medications before your next appointment, please call your pharmacy*  Lab Work: Your physician recommends that you return for lab work in 2-3 weeks for BMP.   Please return for Lab work. You may come to the...   [facility] (3rd floor) [address], [place], [place] [address]  Open: 8am-Noon and 1pm-4:30pm   [place] [facility] [place]- Any location  **no appointments needed**  If you have labs (blood work) drawn today and your tests are completely normal, you will receive your results only by: [employer] (if you have MyChart) OR A paper copy in the mail If you have any lab test that is abnormal or we need to change your treatment, we will call you to review the results.   Testing/Procedures: Your echocardiogram shows your heart pumping function is normal. Your heart is moderately stiff and there was mildly elevated pressure in your lungs. This means you can hold onto fluid which makes your weight go up and feel short of breath. We have adjusted your medications to treat this.   Follow-Up: At [facility], you and your health needs are our priority.  As part of our continuing mission to provide you with exceptional heart care, we have created designated Provider Care Teams.  These Care Teams include your primary Cardiologist (physician) and Advanced Practice Providers (APPs -  Physician Assistants and Nurse Practitioners) who all work together to  provide you with the care you need, when you need it.  We recommend signing up for the patient portal called "MyChart".  Sign up information is provided on this After Visit Summary.  MyChart is used to connect with patients for Virtual Visits (Telemedicine).  Patients are able to view lab/test results, encounter notes, upcoming appointments, etc.  Non-urgent messages can be sent to your provider as well.   To learn more about what you can do with MyChart, go to [email or web address].    Your next appointment:   3 month(s)  The format for your next appointment:   In Person  Provider:   [name], MD or Loel Dubonnet, NP     Other Instructions  Recommend using a hard candy, chewing bum, or Biotene dry mouth rinse to help prevent dry mouth.   Follow a low salt diet and drink less than 2 liters of fluid per day.   Recommend weighing daily and keeping a log. Please take an extra dose of Furosemide (Lasix)  if you have weight gain of 2 pounds overnight or 5 pounds in 1 week.   Date  Time Weight

## 2021-05-12 ENCOUNTER — Other Ambulatory Visit (HOSPITAL_COMMUNITY): Payer: Self-pay | Admitting: Nurse Practitioner

## 2021-06-01 DIAGNOSIS — I509 Heart failure, unspecified: Secondary | ICD-10-CM | POA: Diagnosis not present

## 2021-06-01 DIAGNOSIS — I110 Hypertensive heart disease with heart failure: Secondary | ICD-10-CM | POA: Diagnosis not present

## 2021-06-01 DIAGNOSIS — E119 Type 2 diabetes mellitus without complications: Secondary | ICD-10-CM | POA: Diagnosis not present

## 2021-06-01 DIAGNOSIS — Z885 Allergy status to narcotic agent status: Secondary | ICD-10-CM | POA: Diagnosis not present

## 2021-06-01 DIAGNOSIS — Z87891 Personal history of nicotine dependence: Secondary | ICD-10-CM | POA: Diagnosis not present

## 2021-06-01 DIAGNOSIS — R918 Other nonspecific abnormal finding of lung field: Secondary | ICD-10-CM | POA: Diagnosis not present

## 2021-06-01 DIAGNOSIS — J986 Disorders of diaphragm: Secondary | ICD-10-CM | POA: Diagnosis not present

## 2021-06-01 DIAGNOSIS — I517 Cardiomegaly: Secondary | ICD-10-CM | POA: Diagnosis not present

## 2021-06-01 DIAGNOSIS — Z888 Allergy status to other drugs, medicaments and biological substances status: Secondary | ICD-10-CM | POA: Diagnosis not present

## 2021-06-01 DIAGNOSIS — R0602 Shortness of breath: Secondary | ICD-10-CM | POA: Diagnosis not present

## 2021-06-01 DIAGNOSIS — K449 Diaphragmatic hernia without obstruction or gangrene: Secondary | ICD-10-CM | POA: Diagnosis not present

## 2021-06-01 DIAGNOSIS — R0600 Dyspnea, unspecified: Secondary | ICD-10-CM | POA: Diagnosis not present

## 2021-06-01 DIAGNOSIS — Z7982 Long term (current) use of aspirin: Secondary | ICD-10-CM | POA: Diagnosis not present

## 2021-06-01 DIAGNOSIS — E785 Hyperlipidemia, unspecified: Secondary | ICD-10-CM | POA: Diagnosis not present

## 2021-06-01 DIAGNOSIS — R0989 Other specified symptoms and signs involving the circulatory and respiratory systems: Secondary | ICD-10-CM | POA: Diagnosis not present

## 2021-06-01 DIAGNOSIS — Z20822 Contact with and (suspected) exposure to covid-19: Secondary | ICD-10-CM | POA: Diagnosis not present

## 2021-06-01 DIAGNOSIS — R011 Cardiac murmur, unspecified: Secondary | ICD-10-CM | POA: Diagnosis not present

## 2021-06-01 DIAGNOSIS — R0789 Other chest pain: Secondary | ICD-10-CM | POA: Diagnosis not present

## 2021-06-01 DIAGNOSIS — Z79899 Other long term (current) drug therapy: Secondary | ICD-10-CM | POA: Diagnosis not present

## 2021-06-01 DIAGNOSIS — Z7984 Long term (current) use of oral hypoglycemic drugs: Secondary | ICD-10-CM | POA: Diagnosis not present

## 2021-06-11 ENCOUNTER — Telehealth: Payer: Self-pay | Admitting: Cardiology

## 2021-06-11 DIAGNOSIS — I5032 Chronic diastolic (congestive) heart failure: Secondary | ICD-10-CM

## 2021-06-11 MED ORDER — DAPAGLIFLOZIN PROPANEDIOL 10 MG PO TABS: 10.0000 mg | ORAL_TABLET | Freq: Every day | ORAL | 5 refills | Status: DC

## 2021-06-11 NOTE — Telephone Encounter (Signed)
Spoke with patient's wife. She reports patient has been having a lot of problems since last fall regarding his CHF. She reports no strength/stamina, shortness of breath, heart pounding at night. Wife reports weight fluctuates but no log of weights. Asked to weigh daily and record to bring to appointment. ? ?Scheduled for DOD appt on 06/20/21 with Ellyn Hack MD  ? ?Reviewed meds - patient has only been taking lasix '20mg'$  daily. Wife states they weren't sure how much was safe to take. Reviewed instructions from last visit with Urban Gibson NP ?Lasix '20mg'$  " Patient Sig: Take 1 tablet (20 mg total) by mouth daily. Take additional tablet as needed for weight gain of 2 pounds overnight or 5 pounds in one week." ? ?Saw [place] NP recent - Rx'ed farxiga - first refill was $500 ?They need Rx from [employer] with [place] authorization  ?Sent to pharmacy electronically  ?No samples needed ? ?Sent to MD as Juluis Rainier ?

## 2021-06-11 NOTE — Telephone Encounter (Signed)
Year he is somewhat difficult.  Seem like he is doing a whole lot better when he was seen by [employer]. ? ?I can see him next week and we can sort it out. ? ? ? ?[name], MD ? ? ? ?

## 2021-06-11 NOTE — Telephone Encounter (Signed)
Pt c/o Shortness Of Breath: STAT if SOB developed within the last 24 hours or pt is noticeably SOB on the phone ? ?1. Are you currently SOB (can you hear that pt is SOB on the phone)? On phone with patient's wife ? ?2. How long have you been experiencing SOB? 3 or 4 weeks ? ?3. Are you SOB when sitting or when up moving around? Both  ? ?4. Are you currently experiencing any other symptoms? Chest pounding, lightheadedness, dizziness, nausea, diarhea  ? ?Patient calling the office for samples of medication: ? ? ?1.  What medication and dosage are you requesting samples for? dapagliflozin propanediol (FARXIGA) 10 MG TABS tablet ? ?2.  Are you currently out of this medication? Yes ?   ?

## 2021-06-13 ENCOUNTER — Other Ambulatory Visit: Payer: Self-pay | Admitting: Pulmonary Disease

## 2021-06-13 NOTE — Telephone Encounter (Signed)
Please advise patient requesting refill °

## 2021-06-18 ENCOUNTER — Ambulatory Visit (INDEPENDENT_AMBULATORY_CARE_PROVIDER_SITE_OTHER): Payer: Medicare Other | Admitting: Pulmonary Disease

## 2021-06-18 ENCOUNTER — Encounter: Payer: Self-pay | Admitting: Pulmonary Disease

## 2021-06-18 ENCOUNTER — Other Ambulatory Visit: Payer: Self-pay

## 2021-06-18 DIAGNOSIS — I25119 Atherosclerotic heart disease of native coronary artery with unspecified angina pectoris: Secondary | ICD-10-CM | POA: Diagnosis not present

## 2021-06-18 DIAGNOSIS — J849 Interstitial pulmonary disease, unspecified: Secondary | ICD-10-CM | POA: Diagnosis not present

## 2021-06-18 NOTE — Assessment & Plan Note (Signed)
Favored to be NSIP, this appears stable compared to previous years. ?As such I have asked him to decrease prednisone to 5 mg on alternate days and then every day after a month. ?Unfortunately is unable to perform PFTs for [place] to objectively assess lung function when he does not desaturate on exertion ?

## 2021-06-18 NOTE — Assessment & Plan Note (Signed)
Reviewed cardiology input, his angina meds have been optimized. ?He is on Plavix and apixaban. ?He was given Lasix to take daily and I emphasized weight monitoring ?

## 2021-06-18 NOTE — Progress Notes (Signed)
? ?  Subjective:  ? ? Patient ID: Kenneth [name], male    DOB: 10-Jun-1945, 76 y.o.   MRN: [id number] ? ?HPI ? ?76 yo man with  steroid responsive ILD , probable NSIP.  There is a history of being on methotrexate in the remote past. ?He  responded well to steroids and is maintained on low-dose about 5 mg of prednisone.  We have discussed immunosuppressants in the past and he has preferred not to go on this ?Unfortunately he cannot perform PFTs without significant distress and it is difficult to quantitate lung function ?  ?PMH - CAD, 10/2020  left heart cath s/p PCI to LAD , other stents were patent. ?-atrial fibrillation for 15 years s/p atrial ablation 01/2014 . He takes flecainide if his heart rate gets too high.  ?-mod AS ?For psoriatic arthritis, he was on Enbrel and methotrexate weekly for 3 years ,  then on Humira.  Now on adalimumab ? ?-Wife has GBS and he is the caregiver ? ?Reviewed cardiac evaluation, including echo that showed RVSP of 36 and grade 2 diastolic dysfunction he was started on 20 mg of Lasix, dyspnea has improved somewhat ?He continues to do some woodwork and work on his [employer]. ?He denies coughing or wheezing, pedal edema has decreased. ?We reviewed CT scan, unfortunately is unable to perform PFTs ? ? ?Significant tests/ events reviewed ? ?CT angiogram 09/2007 which shows bilateral groundglass opacities but a repeat CT in 10/2007 and shows resolution of these infiltrates.  ?01/27/2014 HRCT - patchy areas of very mild ground-glass attenuation and subpleural reticulation. ? CHest HRCT 04/2016 >> slight worse , NSIP pattern ?HRCT 05/2017 unchanged NSIP, 2-4 mm new  benign nodules ?  ?HRCT 10/2019 >> likely NSIP, unchanged from 2019 ? ?HRCT 03/2021 >> stable ILD , stable 4.1 cm asc TAA ?  ?  ?PFT 04/2015 >> ratio nml, FVC 61%, TLC 68%, DLCO 67%  ?  ?  ?LHC 10/2020 LAD stent was placed, other stents patent ?  ?09/2020 echo LVEF more than 35%, hyperdynamic, moderate aortic stenosis, dilated LA, normal  RVSP ? ? ? ?Review of Systems ?neg for any significant sore throat, dysphagia, itching, sneezing, nasal congestion or excess/ purulent secretions, fever, chills, sweats, unintended wt loss, pleuritic or exertional cp, hempoptysis, orthopnea pnd or change in chronic leg swelling. Also denies presyncope, palpitations, heartburn, abdominal pain, nausea, vomiting, diarrhea or change in bowel or urinary habits, dysuria,hematuria, rash, arthralgias, visual complaints, headache, numbness weakness or ataxia. ? ?   ?Objective:  ? Physical Exam ? ? ?Gen. Pleasant, obese, in no distress, normal affect ?ENT - no pallor,icterus, no post nasal drip, class 2-3 airway ?Neck: No JVD, no thyromegaly, no carotid bruits ?Lungs: no use of accessory muscles, no dullness to percussion, decreased without rales or rhonchi  ?Cardiovascular: Rhythm regular, heart sounds  normal, no murmurs or gallops, no peripheral edema ?Abdomen: soft and non-tender, no hepatosplenomegaly, BS normal. ?Musculoskeletal: No deformities, no cyanosis or clubbing ?Neuro:  alert, non focal, no tremors ? ? ? ? ?   ?Assessment & Plan:  ? ? ?

## 2021-06-18 NOTE — Patient Instructions (Signed)
?  Drop prednisone to 10 mg on M/W/F  & 5 mg on other days ?

## 2021-06-20 ENCOUNTER — Encounter: Payer: Self-pay | Admitting: Cardiology

## 2021-06-20 ENCOUNTER — Other Ambulatory Visit: Payer: Self-pay

## 2021-06-20 ENCOUNTER — Ambulatory Visit (INDEPENDENT_AMBULATORY_CARE_PROVIDER_SITE_OTHER): Payer: Medicare Other | Admitting: Cardiology

## 2021-06-20 VITALS — BP 120/62 | HR 67 | Ht 75.0 in | Wt 227.0 lb

## 2021-06-20 DIAGNOSIS — E1169 Type 2 diabetes mellitus with other specified complication: Secondary | ICD-10-CM | POA: Diagnosis not present

## 2021-06-20 DIAGNOSIS — R0609 Other forms of dyspnea: Secondary | ICD-10-CM

## 2021-06-20 DIAGNOSIS — I471 Supraventricular tachycardia: Secondary | ICD-10-CM

## 2021-06-20 DIAGNOSIS — I4719 Other supraventricular tachycardia: Secondary | ICD-10-CM

## 2021-06-20 DIAGNOSIS — J849 Interstitial pulmonary disease, unspecified: Secondary | ICD-10-CM | POA: Diagnosis not present

## 2021-06-20 DIAGNOSIS — I25119 Atherosclerotic heart disease of native coronary artery with unspecified angina pectoris: Secondary | ICD-10-CM

## 2021-06-20 DIAGNOSIS — I5032 Chronic diastolic (congestive) heart failure: Secondary | ICD-10-CM

## 2021-06-20 DIAGNOSIS — I10 Essential (primary) hypertension: Secondary | ICD-10-CM | POA: Diagnosis not present

## 2021-06-20 DIAGNOSIS — I5033 Acute on chronic diastolic (congestive) heart failure: Secondary | ICD-10-CM | POA: Diagnosis not present

## 2021-06-20 DIAGNOSIS — E785 Hyperlipidemia, unspecified: Secondary | ICD-10-CM

## 2021-06-20 DIAGNOSIS — I350 Nonrheumatic aortic (valve) stenosis: Secondary | ICD-10-CM

## 2021-06-20 DIAGNOSIS — I491 Atrial premature depolarization: Secondary | ICD-10-CM | POA: Diagnosis not present

## 2021-06-20 DIAGNOSIS — I480 Paroxysmal atrial fibrillation: Secondary | ICD-10-CM

## 2021-06-20 MED ORDER — DILTIAZEM HCL ER COATED BEADS 120 MG PO CP24: 120.0000 mg | ORAL_CAPSULE | Freq: Every day | ORAL | 3 refills | Status: DC

## 2021-06-20 NOTE — Progress Notes (Signed)
? ? ?Primary Care Provider: Landry Mellow, MD ?Cardiologist: [name], MD ?Electrophysiologist: None ? ?Clinic Note: ?Chief Complaint  ?Patient presents with  ? Shortness of Breath  ?  Profoundly worsening dyspnea.  Several months.  ? Coronary Artery Disease  ?  PCI in July 2022 with improved symptoms, now worse.  Anginal equivalent has been dyspnea.  ? ? ?=================================== ? ?ASSESSMENT/PLAN  ? ?Problem List Items Addressed This Visit   ? ?  ? Cardiology Problems  ? Coronary artery disease involving native coronary artery with angina pectoris (HCC) (Chronic)  ? Relevant Medications  ? diltiazem (CARDIZEM CD) 120 MG 24 hr capsule  ? Other Relevant Orders  ? EKG 12-Lead  ? CBC  ? Basic metabolic panel  ? Brain natriuretic peptide  ? Chronic heart failure with preserved ejection fraction (HFpEF) (HCC) (Chronic)  ?  Grade 2 diastolic function noted on echo.  He really is not volume overloaded.  He has mild edema off-and-on but not any now.  He does not have any real JVD.  His hand veins easily collapse with elevation.  His weight is up about 5 pounds today, but for the most part he has been right around 220 pounds at home. ? ?At present, his blood pressure is pretty well controlled and we do not necessarily need ARB for afterload reduction since he is on combination diltiazem and Toprol.  I am increasing his diltiazem to 360 mg. ?(Low threshold for ARB plus or minus spironolactone) ?Continue Wilder Glade ? ?For now I will have him increase his Lasix dose for couple days to twice daily until his weight gets back down to 225 pounds.  He will then dose according to dry weight increasing dose to 40 mg if his weight goes up until he goes back down to 222. ? ?I do not think this low level of diastolic dysfunction is causing his dyspnea to this extent. ?  ?  ? Relevant Medications  ? diltiazem (CARDIZEM CD) 120 MG 24 hr capsule  ? Hyperlipidemia associated with type 2 diabetes mellitus (HCC) (Chronic)   ?  Last lipids were checked in June 2022, LDL was 70.  Triglycerides were little elevated.  He is on stable dose of rosuvastatin.  At this point I am not to address his triglycerides until we reassess lipids.  Likely related to diabetes. ? ?He is on [place] recently started along with metformin (which will have to be held for cardiac cath) ?  ?  ? Relevant Medications  ? diltiazem (CARDIZEM CD) 120 MG 24 hr capsule  ? Acute on chronic diastolic heart failure ([place])  ?  I am not convinced that he is currently having acute on chronic HFpEF I think he has diastolic dysfunction with some intermittent volume issues and weight gain. ? ?I will check BNP level along with his precath labs. ? ?As part of evaluation for ischemia with left heart cath, will also have right heart cath to reassess filling pressures etc.  Pressures were relatively normal by cath in July 2022. ? ?Plan: ?right and left heart catheterization ?Check BMP ?Plan Zio Lasix based on weight change is noted-Target weight at home is 222 pounds ?Continue beta-blocker and calcium blocker for now, low threshold to consider ARB plus or minus spironolactone. ?Continue Wilder Glade ?  ?  ? Relevant Medications  ? diltiazem (CARDIZEM CD) 120 MG 24 hr capsule  ? Other Relevant Orders  ? EKG 12-Lead  ? CBC  ? Basic metabolic panel  ? Brain natriuretic  peptide  ? Paroxysmal atrial fibrillation (HCC) (Chronic)  ?  History of A-fib and atrial flutter ablation years ago.  He is having lots of ectopy, but we did not document any A-fib despite him having symptoms.  As such, I am leery of necessarily needing him to stay on DOAC if he has more bleeding issues.  He had hematuria with nephrolithiasis a few months ago.  Okay to hold Eliquis at any time.  He is also on Plavix making him more likely to bleed. ? ?He is on a combination of diltiazem which have increased to the total of 360 mg daily along with Toprol. ?  ?  ? Relevant Medications  ? diltiazem (CARDIZEM CD) 120 MG 24 hr  capsule  ? Essential hypertension (Chronic)  ?  Blood pressure well controlled. ? ?I am increasing his diltiazem to 360 mg along with Toprol.  Low threshold to consider ARB for given for HFpEF. ? ?On standing dose of Lasix which we are now discussing PRN additional dosing for weight gain and edema. ?  ?  ? Relevant Medications  ? diltiazem (CARDIZEM CD) 120 MG 24 hr capsule  ? Atrial tachycardia (HCC) (Chronic)  ?  I think he is truly having more atrial tachycardia than atrial fibrillation.  Increasing diltiazem for now, but low threshold to consider increasing beta-blocker versus switching to acebutolol at follow-up. ?  ?  ? Relevant Medications  ? diltiazem (CARDIZEM CD) 120 MG 24 hr capsule  ? Premature atrial complexes (Chronic)  ?  Was noted to have frequent PACs noted on monitor.  He is now on combination of diltiazem and low-dose Toprol. ? ?I think he is symptomatic when he has the PACs and the short bursts of PAT. ? ?Plan:  ?Continue Toprol 25 mg daily although low threshold to consider switching from Toprol to acebutolol to avoid fatigue. ?We will increase total diltiazem dose to 360 mg-simply add 120 mg to the existing 240 mg. ?  ?  ? Relevant Medications  ? diltiazem (CARDIZEM CD) 120 MG 24 hr capsule  ? Mild aortic stenosis by prior echocardiogram (Chronic)  ?  Recent echo showed mild aortic stenosis.  He does have a soft murmur.  Gradients are not the extent that he would be considered to be significant enough to cause symptoms. ? ?Would simply monitor with echo in a year just to ensure it is stable.  If no change, can follow-up every couple years. ?  ?  ? Relevant Medications  ? diltiazem (CARDIZEM CD) 120 MG 24 hr capsule  ?  ? Other  ? ILD (interstitial lung disease) (HCC) (Chronic)  ?  Seen recently by Dr. Elsworth Soho.  Back to 5 mg of prednisone. ? ?Hopefully we can get him to calm down enough to check PFTs which will help understand. ?Checking right heart cath to assess pressures. ? ?  ?  ? Exertional  dyspnea - Class III Angina - Primary (Chronic)  ?  He is now having more than just exertional dyspnea he is resting dyspnea which almost seems more consistent with tachypnea.  He is not hypoxic as indicated by pulmonary medicine. ? ?He does have interstitial lung disease and emphysema is noted on CT scan which are quite likely contributing to this.  There is evidence of diastolic dysfunction and probably mild class II HFpEF but not to the extent of the profound dyspnea that he is noting now. ? ?Part my concern is that his dyspnea could very well be a  sensation of air hunger that then leads to anxiety.  He is easily triggered from nice resting breathing pattern to tachypnea by simply having him try to explain something. ? ?Plan for now however we will continue to exclude progressive CAD despite nonischemic Myoview recently.  We will also reassess volume status and PA pressures etc. with right heart cath. ? ?Plan: Right and Left Heart Catheterization with Possible PCI-scheduled for 07/04/2021. ? ?I will also check a BNP level with his precath labs ?  ?  ? ? ?=================================== ? ?HPI:   ? ?Kenneth [name] is a 76 y.o. male with a PMH notable for CAD-LAD PCI, history of A-fib/flutter s/p ablation, chronic HFpEF, HLD, ILD (and CT scan with suggestion of emphysema) with Chronic Dyspnea who presents today for close evaluation for PROGRESSIVELY WORSENING DYSPNEA, and profound fatigue. ? ?CARDIAC HISTORY: ?2006: Atrial Flutter - s/p Ablation ?01/2014: Afib Ablation (off [place]) ?06/2016: Sx of DOE -> R&LHC - mild Pulm HTN, Severe mRCA 95% (DES PCI), p-mLAD ~70% w/ FFR 0.75 (staged DES PCI). Co-dom LCx distal 60% & OM3 55%.  ?07/2016: Staged PCI LAD ?10/11/2020: R&LHC - FFR guided DES PCI (PTCA ost D1); patent mRCA DES as well as p-m LAD. => m-d LAD 70% (after D1 - 60% ost D1) - FFR 0.75 -> Synergy DES 2.5 x 32 (post-dilation 3.7-2.6 mm), Ost D1 PTCA -> reduced to 40%. ? ?I last saw Hemi in July 2022 for post-cath  follow-up.  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  Not able to tolerate beta-blockers because of fatigue.  Converted to diltiazem.  Continued on Plavix.  N

## 2021-06-20 NOTE — Patient Instructions (Signed)
Medication Instructions:  ?START Cardizem 120 mg (along with what you currently take) ?DOUBLE up on your lasix until your weight of 220-222 lbs.  ? ?*If you need a refill on your cardiac medications before your next appointment, please call your pharmacy* ? ? ?Lab Work: ?CBC, BMET, BNP today  ? ?If you have labs (blood work) drawn today and your tests are completely normal, you will receive your results only by: ?MyChart Message (if you have MyChart) OR ?A paper copy in the mail ?If you have any lab test that is abnormal or we need to change your treatment, we will call you to review the results. ? ? ?Testing/Procedures: ? ?Your physician has requested that you have a cardiac catheterization. Cardiac catheterization is used to diagnose and/or treat various heart conditions. Doctors may recommend this procedure for a number of different reasons. The most common reason is to evaluate chest pain. Chest pain can be a symptom of coronary artery disease (CAD), and cardiac catheterization can show whether plaque is narrowing or blocking your heart?s arteries. This procedure is also used to evaluate the valves, as well as measure the blood flow and oxygen levels in different parts of your heart. For further information please visit [email or web address]. Please follow instruction sheet, as given. ? ? ? ?Follow-Up: ?At [facility], you and your health needs are our priority.  As part of our continuing mission to provide you with exceptional heart care, we have created designated Provider Care Teams.  These Care Teams include your primary Cardiologist (physician) and Advanced Practice Providers (APPs -  Physician Assistants and Nurse Practitioners) who all work together to provide you with the care you need, when you need it. ? ?We recommend signing up for the patient portal called "MyChart".  Sign up information is provided on this After Visit Summary.  MyChart is used to connect with patients for Virtual Visits  (Telemedicine).  Patients are able to view lab/test results, encounter notes, upcoming appointments, etc.  Non-urgent messages can be sent to your provider as well.   ?To learn more about what you can do with MyChart, go to [email or web address].   ? ?Your next appointment:   ?April 17th at 10:40 AM  ? ?The format for your next appointment:   ?In Person ? ?Provider:   ?[name], MD   ? ? ?Other Instructions ? ?[place] ?[address] ?[address] ?[place] [address] ?Dept: [phone] ?Loc: [phone] ? ?HUGO LYBRAND  06/20/2021 ? ?You are scheduled for a Cardiac Catheterization on Wednesday, March 29 with Dr. [name]. ? ?1. Please arrive at the Main Entrance A at [facility]: [place], [place] [address] at 8:00 AM (This time is two hours before your procedure to ensure your preparation). Free valet parking service is available.  ? ?Special note: Every effort is made to have your procedure done on time. Please understand that emergencies sometimes delay scheduled procedures. ? ?2. Diet: Do not eat solid foods after midnight.  You may have clear liquids until 5 AM upon the day of the procedure. ? ?3. Labs: You will need to have blood drawn today- CBC, BMET, BNP today. You do not need to be fasting. ? ?4. Medication instructions in preparation for your procedure: ? ? Contrast Allergy: No ? ?Stop taking Eliquis (Apixiban) on Monday, March 27. ? ? ?On the morning of your procedure, take Plavix/Clopidogrel and any morning medicines NOT listed above.  You may use sips of water. ? ?5. Plan to go home the same day, you will only stay overnight if medically necessary. ?6. You MUST have a responsible adult to drive you home. ?7. An adult MUST be with you the first 24 hours after you arrive home. ?8. Bring a current list of your medications, and the last time and date medication taken. ?9. Bring ID and  current insurance cards. ?10.Please wear clothes that are easy to get on and off and wear slip-on shoes. ? ?Thank you for allowing [place] to care for you! ?  -- [facility] ? ? ?

## 2021-06-20 NOTE — H&P (View-Only) (Signed)
? ? ?Primary Care Provider: Landry Mellow, MD ?Cardiologist: [name], MD ?Electrophysiologist: None ? ?Clinic Note: ?Chief Complaint  ?Patient presents with  ? Shortness of Breath  ?  Profoundly worsening dyspnea.  Several months.  ? Coronary Artery Disease  ?  PCI in July 2022 with improved symptoms, now worse.  Anginal equivalent has been dyspnea.  ? ? ?=================================== ? ?ASSESSMENT/PLAN  ? ?Problem List Items Addressed This Visit   ? ?  ? Cardiology Problems  ? Coronary artery disease involving native coronary artery with angina pectoris (HCC) (Chronic)  ? Relevant Medications  ? diltiazem (CARDIZEM CD) 120 MG 24 hr capsule  ? Other Relevant Orders  ? EKG 12-Lead  ? CBC  ? Basic metabolic panel  ? Brain natriuretic peptide  ? Chronic heart failure with preserved ejection fraction (HFpEF) (HCC) (Chronic)  ?  Grade 2 diastolic function noted on echo.  He really is not volume overloaded.  He has mild edema off-and-on but not any now.  He does not have any real JVD.  His hand veins easily collapse with elevation.  His weight is up about 5 pounds today, but for the most part he has been right around 220 pounds at home. ? ?At present, his blood pressure is pretty well controlled and we do not necessarily need ARB for afterload reduction since he is on combination diltiazem and Toprol.  I am increasing his diltiazem to 360 mg. ?(Low threshold for ARB plus or minus spironolactone) ?Continue Wilder Glade ? ?For now I will have him increase his Lasix dose for couple days to twice daily until his weight gets back down to 225 pounds.  He will then dose according to dry weight increasing dose to 40 mg if his weight goes up until he goes back down to 222. ? ?I do not think this low level of diastolic dysfunction is causing his dyspnea to this extent. ?  ?  ? Relevant Medications  ? diltiazem (CARDIZEM CD) 120 MG 24 hr capsule  ? Hyperlipidemia associated with type 2 diabetes mellitus (HCC) (Chronic)   ?  Last lipids were checked in June 2022, LDL was 70.  Triglycerides were little elevated.  He is on stable dose of rosuvastatin.  At this point I am not to address his triglycerides until we reassess lipids.  Likely related to diabetes. ? ?He is on [place] recently started along with metformin (which will have to be held for cardiac cath) ?  ?  ? Relevant Medications  ? diltiazem (CARDIZEM CD) 120 MG 24 hr capsule  ? Acute on chronic diastolic heart failure ([place])  ?  I am not convinced that he is currently having acute on chronic HFpEF I think he has diastolic dysfunction with some intermittent volume issues and weight gain. ? ?I will check BNP level along with his precath labs. ? ?As part of evaluation for ischemia with left heart cath, will also have right heart cath to reassess filling pressures etc.  Pressures were relatively normal by cath in July 2022. ? ?Plan: ?right and left heart catheterization ?Check BMP ?Plan Zio Lasix based on weight change is noted-Target weight at home is 222 pounds ?Continue beta-blocker and calcium blocker for now, low threshold to consider ARB plus or minus spironolactone. ?Continue Wilder Glade ?  ?  ? Relevant Medications  ? diltiazem (CARDIZEM CD) 120 MG 24 hr capsule  ? Other Relevant Orders  ? EKG 12-Lead  ? CBC  ? Basic metabolic panel  ? Brain natriuretic  peptide  ? Paroxysmal atrial fibrillation (HCC) (Chronic)  ?  History of A-fib and atrial flutter ablation years ago.  He is having lots of ectopy, but we did not document any A-fib despite him having symptoms.  As such, I am leery of necessarily needing him to stay on DOAC if he has more bleeding issues.  He had hematuria with nephrolithiasis a few months ago.  Okay to hold Eliquis at any time.  He is also on Plavix making him more likely to bleed. ? ?He is on a combination of diltiazem which have increased to the total of 360 mg daily along with Toprol. ?  ?  ? Relevant Medications  ? diltiazem (CARDIZEM CD) 120 MG 24 hr  capsule  ? Essential hypertension (Chronic)  ?  Blood pressure well controlled. ? ?I am increasing his diltiazem to 360 mg along with Toprol.  Low threshold to consider ARB for given for HFpEF. ? ?On standing dose of Lasix which we are now discussing PRN additional dosing for weight gain and edema. ?  ?  ? Relevant Medications  ? diltiazem (CARDIZEM CD) 120 MG 24 hr capsule  ? Atrial tachycardia (HCC) (Chronic)  ?  I think he is truly having more atrial tachycardia than atrial fibrillation.  Increasing diltiazem for now, but low threshold to consider increasing beta-blocker versus switching to acebutolol at follow-up. ?  ?  ? Relevant Medications  ? diltiazem (CARDIZEM CD) 120 MG 24 hr capsule  ? Premature atrial complexes (Chronic)  ?  Was noted to have frequent PACs noted on monitor.  He is now on combination of diltiazem and low-dose Toprol. ? ?I think he is symptomatic when he has the PACs and the short bursts of PAT. ? ?Plan:  ?Continue Toprol 25 mg daily although low threshold to consider switching from Toprol to acebutolol to avoid fatigue. ?We will increase total diltiazem dose to 360 mg-simply add 120 mg to the existing 240 mg. ?  ?  ? Relevant Medications  ? diltiazem (CARDIZEM CD) 120 MG 24 hr capsule  ? Mild aortic stenosis by prior echocardiogram (Chronic)  ?  Recent echo showed mild aortic stenosis.  He does have a soft murmur.  Gradients are not the extent that he would be considered to be significant enough to cause symptoms. ? ?Would simply monitor with echo in a year just to ensure it is stable.  If no change, can follow-up every couple years. ?  ?  ? Relevant Medications  ? diltiazem (CARDIZEM CD) 120 MG 24 hr capsule  ?  ? Other  ? ILD (interstitial lung disease) (HCC) (Chronic)  ?  Seen recently by Dr. Elsworth Soho.  Back to 5 mg of prednisone. ? ?Hopefully we can get him to calm down enough to check PFTs which will help understand. ?Checking right heart cath to assess pressures. ? ?  ?  ? Exertional  dyspnea - Class III Angina - Primary (Chronic)  ?  He is now having more than just exertional dyspnea he is resting dyspnea which almost seems more consistent with tachypnea.  He is not hypoxic as indicated by pulmonary medicine. ? ?He does have interstitial lung disease and emphysema is noted on CT scan which are quite likely contributing to this.  There is evidence of diastolic dysfunction and probably mild class II HFpEF but not to the extent of the profound dyspnea that he is noting now. ? ?Part my concern is that his dyspnea could very well be a  sensation of air hunger that then leads to anxiety.  He is easily triggered from nice resting breathing pattern to tachypnea by simply having him try to explain something. ? ?Plan for now however we will continue to exclude progressive CAD despite nonischemic Myoview recently.  We will also reassess volume status and PA pressures etc. with right heart cath. ? ?Plan: Right and Left Heart Catheterization with Possible PCI-scheduled for 07/04/2021. ? ?I will also check a BNP level with his precath labs ?  ?  ? ? ?=================================== ? ?HPI:   ? ?Kenneth [name] is a 76 y.o. male with a PMH notable for CAD-LAD PCI, history of A-fib/flutter s/p ablation, chronic HFpEF, HLD, ILD (and CT scan with suggestion of emphysema) with Chronic Dyspnea who presents today for close evaluation for PROGRESSIVELY WORSENING DYSPNEA, and profound fatigue. ? ?CARDIAC HISTORY: ?2006: Atrial Flutter - s/p Ablation ?01/2014: Afib Ablation (off [place]) ?06/2016: Sx of DOE -> [facility] - mild Pulm HTN, Severe mRCA 95% (DES PCI), p-mLAD ~70% w/ FFR 0.75 (staged DES PCI). Co-dom LCx distal 60% & OM3 55%.  ?07/2016: Staged PCI LAD ?10/11/2020: [facility] - FFR guided DES PCI (PTCA ost D1); patent mRCA DES as well as p-m LAD. => m-d LAD 70% (after D1 - 60% ost D1) - FFR 0.75 -> Synergy DES 2.5 x 32 (post-dilation 3.7-2.6 mm), Ost D1 PTCA -> reduced to 40%. ? ?I last saw Kenneth [name] in July 2022 for post-cath  follow-up.  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  Not able to tolerate beta-blockers because of fatigue.  Converted to diltiazem.  Continued on Plavix.  N

## 2021-06-21 ENCOUNTER — Encounter: Payer: Self-pay | Admitting: Cardiology

## 2021-06-21 DIAGNOSIS — I350 Nonrheumatic aortic (valve) stenosis: Secondary | ICD-10-CM | POA: Insufficient documentation

## 2021-06-21 LAB — BASIC METABOLIC PANEL
BUN/Creatinine Ratio: 15 (ref 10–24)
BUN: 17 mg/dL (ref 8–27)
CO2: 19 mmol/L — ABNORMAL LOW (ref 20–29)
Calcium: 10 mg/dL (ref 8.6–10.2)
Chloride: 101 mmol/L (ref 96–106)
Creatinine, Ser: 1.14 mg/dL (ref 0.76–1.27)
Glucose: 174 mg/dL — ABNORMAL HIGH (ref 70–99)
Potassium: 4 mmol/L (ref 3.5–5.2)
Sodium: 142 mmol/L (ref 134–144)
eGFR: 67 mL/min/{1.73_m2} (ref >59)

## 2021-06-21 LAB — CBC
Hematocrit: 34.1 % — ABNORMAL LOW (ref 37.5–51.0)
Hemoglobin: 11.1 g/dL — ABNORMAL LOW (ref 13.0–17.7)
MCH: 27.3 pg (ref 26.6–33.0)
MCHC: 32.6 g/dL (ref 31.5–35.7)
MCV: 84 fL (ref 79–97)
Platelets: 282 10*3/uL (ref 150–450)
RBC: 4.07 x10E6/uL — ABNORMAL LOW (ref 4.14–5.80)
RDW: 14.8 % (ref 11.6–15.4)
WBC: 9.6 10*3/uL (ref 3.4–10.8)

## 2021-06-21 LAB — BRAIN NATRIURETIC PEPTIDE: BNP: 62.3 pg/mL (ref 0.0–100.0)

## 2021-06-21 NOTE — Assessment & Plan Note (Signed)
Was noted to have frequent PACs noted on monitor.  He is now on combination of diltiazem and low-dose Toprol. ? ?I think he is symptomatic when he has the PACs and the short bursts of PAT. ? ?Plan:  ?? Continue Toprol 25 mg daily although low threshold to consider switching from Toprol to acebutolol to avoid fatigue. ?? We will increase total diltiazem dose to 360 mg-simply add 120 mg to the existing 240 mg. ?

## 2021-06-21 NOTE — Assessment & Plan Note (Signed)
Last lipids were checked in June 2022, LDL was 70.  Triglycerides were little elevated.  He is on stable dose of rosuvastatin.  At this point I am not to address his triglycerides until we reassess lipids.  Likely related to diabetes. ? ?He is on [place] recently started along with metformin (which will have to be held for cardiac cath) ?

## 2021-06-21 NOTE — Assessment & Plan Note (Signed)
History of A-fib and atrial flutter ablation years ago.  He is having lots of ectopy, but we did not document any A-fib despite him having symptoms.  As such, I am leery of necessarily needing him to stay on DOAC if he has more bleeding issues.  He had hematuria with nephrolithiasis a few months ago.  Okay to hold Eliquis at any time.  He is also on Plavix making him more likely to bleed. ? ?He is on a combination of diltiazem which have increased to the total of 360 mg daily along with Toprol. ?

## 2021-06-21 NOTE — Assessment & Plan Note (Signed)
Grade 2 diastolic function noted on echo.  He really is not volume overloaded.  He has mild edema off-and-on but not any now.  He does not have any real JVD.  His hand veins easily collapse with elevation.  His weight is up about 5 pounds today, but for the most part he has been right around 220 pounds at home. ? ?At present, his blood pressure is pretty well controlled and we do not necessarily need ARB for afterload reduction since he is on combination diltiazem and Toprol.  I am increasing his diltiazem to 360 mg. ?(Low threshold for ARB plus or minus spironolactone) ?Continue Wilder Glade ? ?For now I will have him increase his Lasix dose for couple days to twice daily until his weight gets back down to 225 pounds.  He will then dose according to dry weight increasing dose to 40 mg if his weight goes up until he goes back down to 222. ? ?I do not think this low level of diastolic dysfunction is causing his dyspnea to this extent. ?

## 2021-06-21 NOTE — Assessment & Plan Note (Signed)
Seen recently by Dr. Elsworth Soho.  Back to 5 mg of prednisone. ? ?Hopefully we can get him to calm down enough to check PFTs which will help understand. ?Checking right heart cath to assess pressures. ? ?

## 2021-06-21 NOTE — Assessment & Plan Note (Signed)
I am not convinced that he is currently having acute on chronic HFpEF I think he has diastolic dysfunction with some intermittent volume issues and weight gain. ? ?I will check BNP level along with his precath labs. ? ?As part of evaluation for ischemia with left heart cath, will also have right heart cath to reassess filling pressures etc.  Pressures were relatively normal by cath in July 2022. ? ?Plan: ?? right and left heart catheterization ?? Check BMP ?? Plan Zio Lasix based on weight change is noted-Target weight at home is 222 pounds ?? Continue beta-blocker and calcium blocker for now, low threshold to consider ARB plus or minus spironolactone. ?? Continue Wilder Glade ?

## 2021-06-21 NOTE — Assessment & Plan Note (Signed)
I think he is truly having more atrial tachycardia than atrial fibrillation.  Increasing diltiazem for now, but low threshold to consider increasing beta-blocker versus switching to acebutolol at follow-up. ?

## 2021-06-21 NOTE — Assessment & Plan Note (Signed)
Recent echo showed mild aortic stenosis.  He does have a soft murmur.  Gradients are not the extent that he would be considered to be significant enough to cause symptoms. ? ?Would simply monitor with echo in a year just to ensure it is stable.  If no change, can follow-up every couple years. ?

## 2021-06-21 NOTE — Assessment & Plan Note (Signed)
He is now having more than just exertional dyspnea he is resting dyspnea which almost seems more consistent with tachypnea.  He is not hypoxic as indicated by pulmonary medicine. ? ?He does have interstitial lung disease and emphysema is noted on CT scan which are quite likely contributing to this.  There is evidence of diastolic dysfunction and probably mild class II HFpEF but not to the extent of the profound dyspnea that he is noting now. ? ?Part my concern is that his dyspnea could very well be a sensation of air hunger that then leads to anxiety.  He is easily triggered from nice resting breathing pattern to tachypnea by simply having him try to explain something. ? ?Plan for now however we will continue to exclude progressive CAD despite nonischemic Myoview recently.  We will also reassess volume status and PA pressures etc. with right heart cath. ? ?Plan: Right and Left Heart Catheterization with Possible PCI-scheduled for 07/04/2021. ? ?I will also check a BNP level with his precath labs ?

## 2021-06-21 NOTE — Assessment & Plan Note (Addendum)
Blood pressure well controlled. ? ?I am increasing his diltiazem to 360 mg along with Toprol.  Low threshold to consider ARB for given for HFpEF. ? ?On standing dose of Lasix which we are now discussing PRN additional dosing for weight gain and edema. ?

## 2021-06-27 IMAGING — CT CT RENAL STONE PROTOCOL
2 of 4 series · 15 of 46 positions shown, 17 images · non-contrast
Comparison: 04/16/2019 CT abdomen/pelvis.

CLINICAL DATA: Bilateral flank and back pain with severe nausea.
History of nephrolithiasis.

EXAM:
CT ABDOMEN AND PELVIS WITHOUT CONTRAST
TECHNIQUE: Multidetector CT imaging of the abdomen and pelvis was performed
following the standard protocol without IV contrast.

[Series 3: renal stone 5.0 · axial · 0.81mm/px · z∈[-572,-78]mm · 12 of 114 slices shown, 14 images]
[im 10/114  soft-tissue]
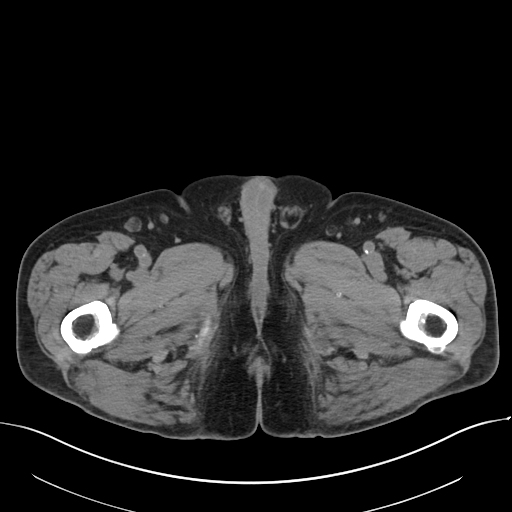
[im 10/114  bone]
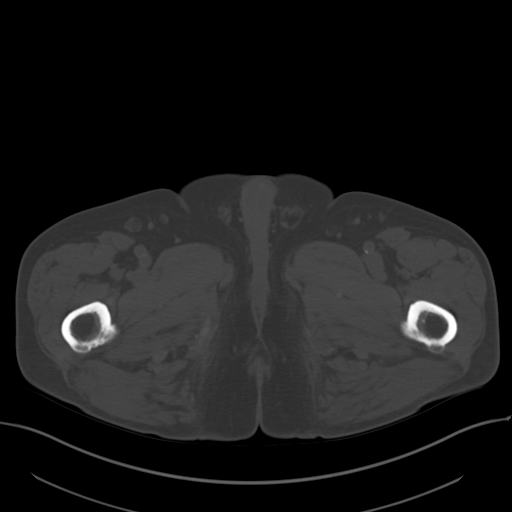
[im 19/114  soft-tissue]
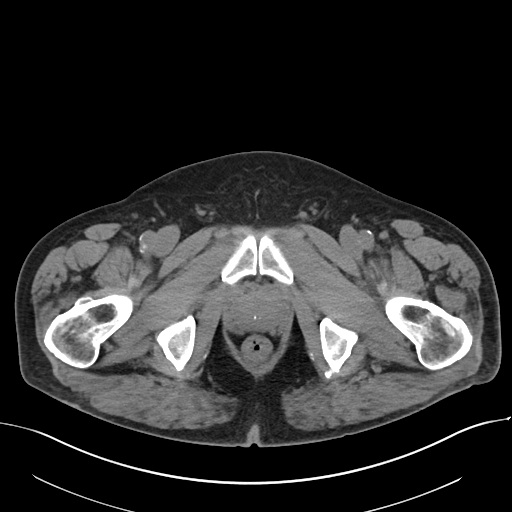
[im 28/114  soft-tissue]
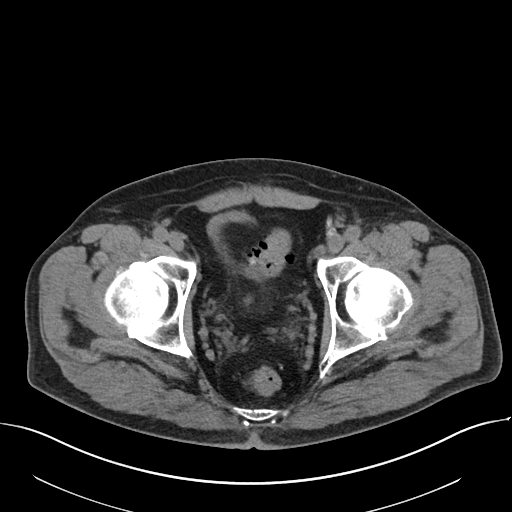
[im 37/114  soft-tissue]
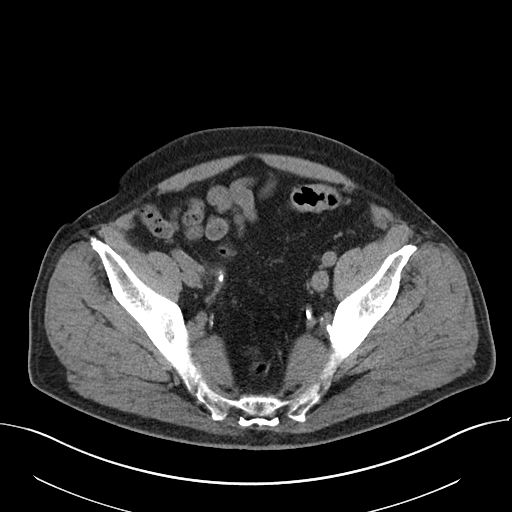
[im 46/114  soft-tissue]
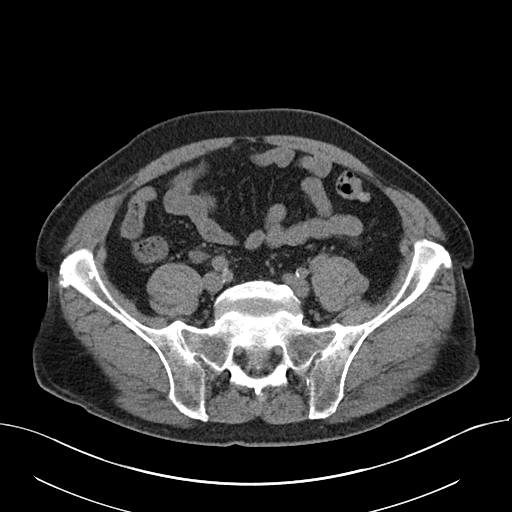
[im 55/114  soft-tissue]
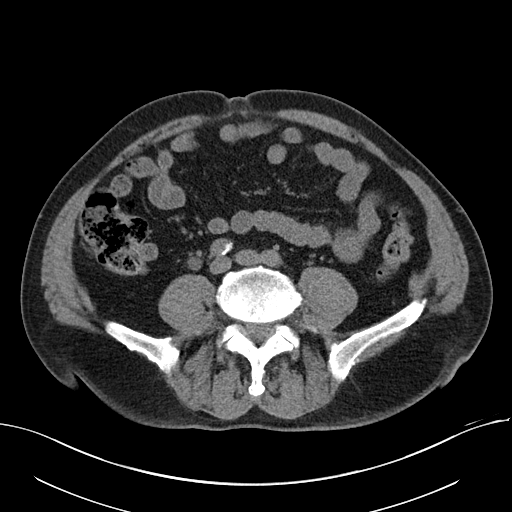
[im 64/114  soft-tissue]
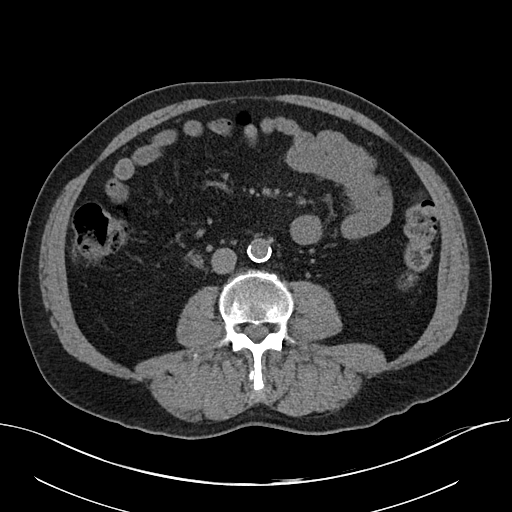
[im 73/114  soft-tissue]
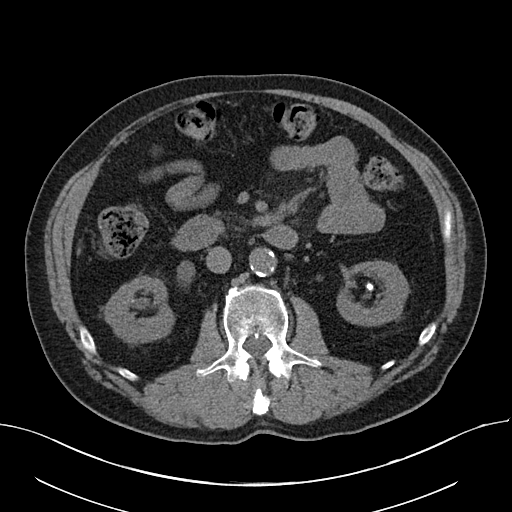
[im 82/114  soft-tissue]
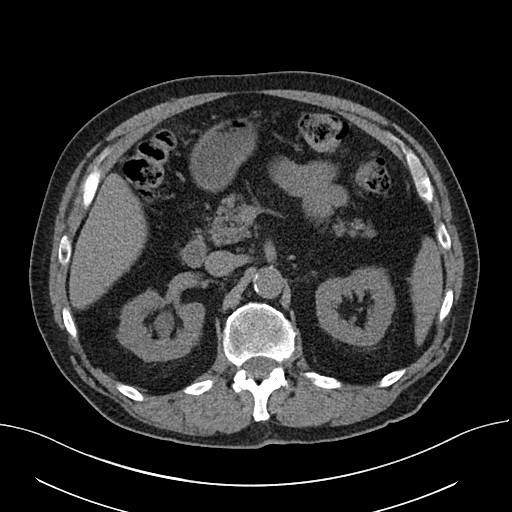
[im 82/114  bone]
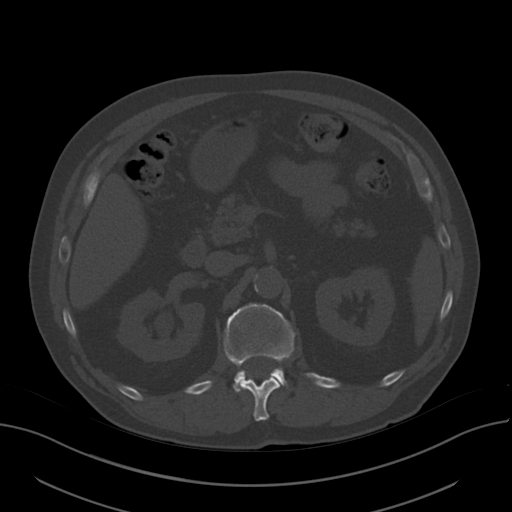
[im 91/114  soft-tissue]
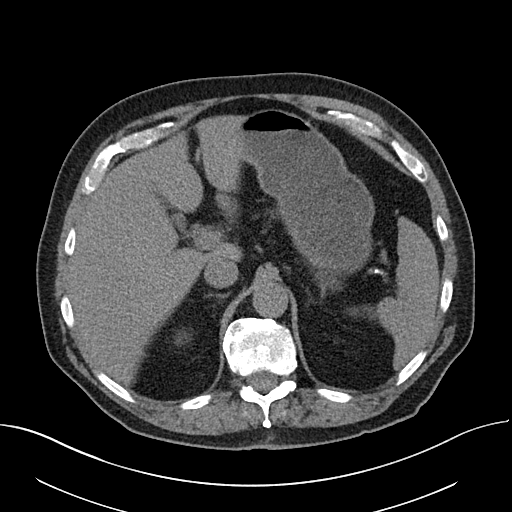
[im 100/114  soft-tissue]
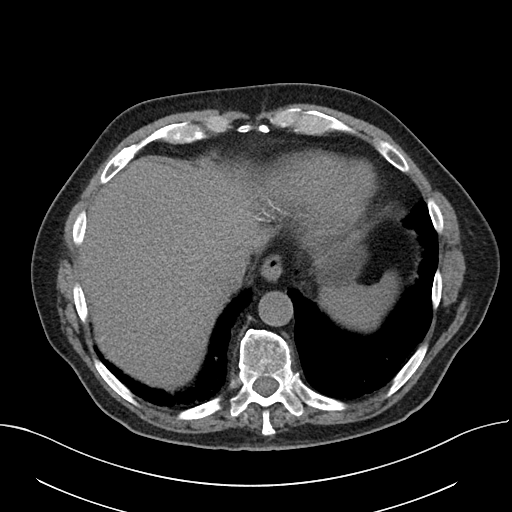
[im 109/114  soft-tissue]
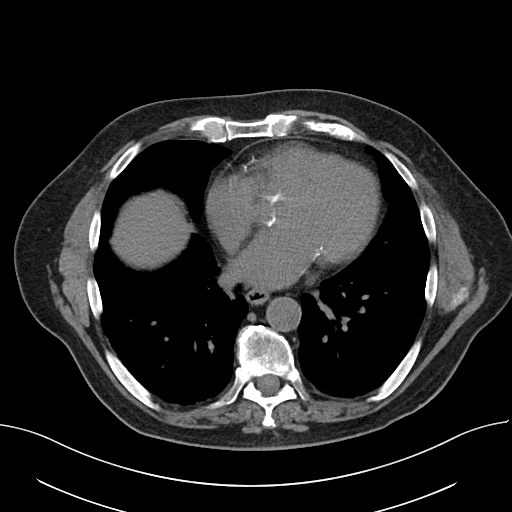

[Series 6: coronal · coronal · 0.80mm/px · 3 of 108 slices shown]
[im 36/108  soft-tissue]
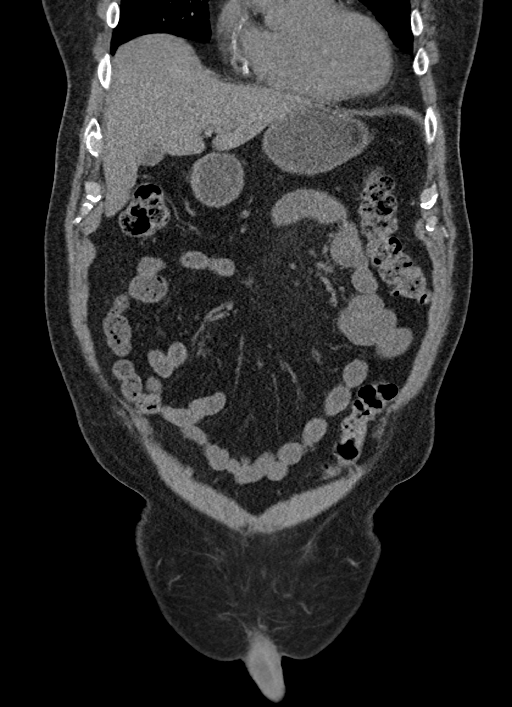
[im 48/108  soft-tissue]
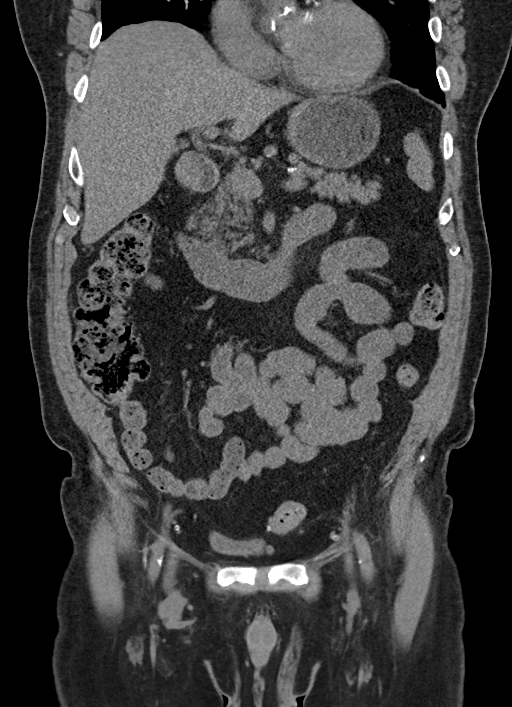
[im 60/108  soft-tissue]
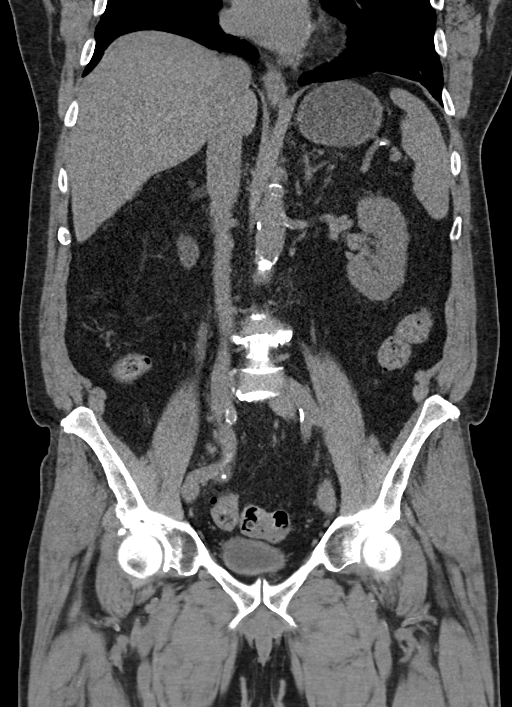

[15 of 46 positions shown; findings below may reference images not displayed]

FINDINGS: Lower chest: Nonspecific [place] subpleural reticulation and
ground-glass opacity at both lung bases, unchanged. No acute
abnormality at the lung bases. Coronary atherosclerosis.

Hepatobiliary: Normal liver size. No liver mass. Normal gallbladder
with no radiopaque cholelithiasis. No biliary ductal dilatation.

Pancreas: Normal, with no mass or duct dilation.

Spleen: Normal size. No mass.

Adrenals/Urinary Tract: Normal adrenals. Clustered 4 mm and 3 mm mid
right ureteral stones in the vicinity of the right external iliac
artery with moderate right hydroureteronephrosis. Several
nonobstructing stones in right greater than left kidneys, largest 11
mm in the upper right kidney and 5 mm in the lower left kidney. No
left hydronephrosis. Normal caliber left ureter. No additional
ureteral stones. No contour deforming renal masses. Normal
nondistended bladder.

Stomach/Bowel: Normal non-distended stomach. Normal caliber small
bowel with no small bowel wall thickening. Appendectomy. Moderate
sigmoid diverticulosis with no large bowel wall thickening or
significant pericolonic fat stranding.

Vascular/Lymphatic: Atherosclerotic abdominal aorta with ectatic
cm infrarenal abdominal aorta. No pathologically enlarged lymph
nodes in the abdomen or pelvis.

Reproductive: Top-normal size prostate with nonspecific internal
prostatic calcifications

Other: No pneumoperitoneum, ascites or focal fluid collection. Tiny
fat containing umbilical hernia.

Musculoskeletal: No aggressive appearing focal osseous lesions.
Moderate thoracolumbar spondylosis.
IMPRESSION: 1. Clustered obstructing 4 mm and 3 mm mid right ureteral stones in
the vicinity of the right external iliac artery with moderate right
hydroureteronephrosis.
2. Nonobstructing bilateral nephrolithiasis.
3. Moderate sigmoid diverticulosis.
4. Infrarenal 2.7 cm ectatic abdominal aorta, at risk for aneurysm
development. Recommend follow-up aortic ultrasound in 5 years. This
recommendation follows ACR consensus guidelines: White Paper of the
ACR Incidental Findings Committee II on Vascular Findings. [HOSPITAL] 2673; [DATE].
5. Aortic Atherosclerosis (1OPMR-O35.5).

## 2021-06-29 ENCOUNTER — Telehealth: Payer: Self-pay | Admitting: *Deleted

## 2021-06-29 NOTE — Telephone Encounter (Signed)
-----   Message from Leonie Man, MD sent at 06/21/2021  9:43 PM EDT ----- ?[facility] heart cath Labs show stable hemoglobin levels and electrolytes. ? ?BNP is normal.  This argues against heart failure causing that level of shortness of breath.  We can fully disclose this with heart catheterization. ? ? ?[name], MD ? ?

## 2021-06-29 NOTE — Telephone Encounter (Signed)
Per DPR, left detailed message concerning results.  Any question  may call back  ? Cath schedule for 07/04/21 ?

## 2021-07-02 ENCOUNTER — Other Ambulatory Visit: Payer: Self-pay | Admitting: *Deleted

## 2021-07-02 DIAGNOSIS — I5033 Acute on chronic diastolic (congestive) heart failure: Secondary | ICD-10-CM

## 2021-07-02 DIAGNOSIS — I25119 Atherosclerotic heart disease of native coronary artery with unspecified angina pectoris: Secondary | ICD-10-CM

## 2021-07-02 NOTE — Progress Notes (Signed)
ORDER PLACED FOR RIGHT AND LEFT HEART CATH ?

## 2021-07-03 ENCOUNTER — Telehealth: Payer: Self-pay | Admitting: *Deleted

## 2021-07-03 NOTE — Telephone Encounter (Addendum)
Cardiac Catheterization scheduled at [facility] for: Wednesday July 04, 2021 10 AM ?Arrival time and place: [facility] at: 8 AM ? ?No solid food after midnight prior to cath, clear liquids until 5 AM day of procedure. ? ?Medication instructions: ?-Hold: ? Eliquis-none 07/02/21 until post procedure ? Lasix/Farxiga-AM of procedure ? Metformin-day of procedure and 48 hours post procedure ?-Except hold medications usual morning medications can be taken with sips of water including aspirin 81 mg and Plavix 75 mg. ? ?Confirmed patient has responsible adult to drive home post procedure and be with patient first 24 hours after arriving home. ? ?Patient reports no new symptoms concerning for COVID-19/no exposure to COVID-19 in the past 10 days. ? ?Reviewed procedure instructions with patient.  ? ?

## 2021-07-03 NOTE — Telephone Encounter (Signed)
Patient states his wife has mobility issues and requests his daughter be allowed to accompany his wife and wait in the waiting room with her during the time he is at hospital for procedure. ?Short Stay and RN Cath Lab aware and okay.  ?

## 2021-07-04 ENCOUNTER — Other Ambulatory Visit: Payer: Self-pay

## 2021-07-04 ENCOUNTER — Ambulatory Visit (HOSPITAL_COMMUNITY)
Admission: RE | Admit: 2021-07-04 | Discharge: 2021-07-04 | Disposition: A | Discharge status: Home / Self Care | Payer: Medicare Other | Attending: Cardiology | Admitting: Cardiology

## 2021-07-04 ENCOUNTER — Encounter (HOSPITAL_COMMUNITY)
Admission: RE | Disposition: A | Discharge status: Home / Self Care | Payer: Self-pay | Source: Home / Self Care | Attending: Cardiology

## 2021-07-04 DIAGNOSIS — I2510 Atherosclerotic heart disease of native coronary artery without angina pectoris: Secondary | ICD-10-CM

## 2021-07-04 DIAGNOSIS — E119 Type 2 diabetes mellitus without complications: Secondary | ICD-10-CM | POA: Diagnosis not present

## 2021-07-04 DIAGNOSIS — I5032 Chronic diastolic (congestive) heart failure: Secondary | ICD-10-CM | POA: Diagnosis present

## 2021-07-04 DIAGNOSIS — Y832 Surgical operation with anastomosis, bypass or graft as the cause of abnormal reaction of the patient, or of later complication, without mention of misadventure at the time of the procedure: Secondary | ICD-10-CM | POA: Insufficient documentation

## 2021-07-04 DIAGNOSIS — Z87891 Personal history of nicotine dependence: Secondary | ICD-10-CM | POA: Diagnosis not present

## 2021-07-04 DIAGNOSIS — E785 Hyperlipidemia, unspecified: Secondary | ICD-10-CM | POA: Insufficient documentation

## 2021-07-04 DIAGNOSIS — I350 Nonrheumatic aortic (valve) stenosis: Secondary | ICD-10-CM | POA: Insufficient documentation

## 2021-07-04 DIAGNOSIS — I25119 Atherosclerotic heart disease of native coronary artery with unspecified angina pectoris: Secondary | ICD-10-CM | POA: Diagnosis not present

## 2021-07-04 DIAGNOSIS — I110 Hypertensive heart disease with heart failure: Secondary | ICD-10-CM | POA: Insufficient documentation

## 2021-07-04 DIAGNOSIS — J849 Interstitial pulmonary disease, unspecified: Secondary | ICD-10-CM | POA: Insufficient documentation

## 2021-07-04 DIAGNOSIS — Z9861 Coronary angioplasty status: Secondary | ICD-10-CM

## 2021-07-04 DIAGNOSIS — I480 Paroxysmal atrial fibrillation: Secondary | ICD-10-CM | POA: Insufficient documentation

## 2021-07-04 DIAGNOSIS — Z7901 Long term (current) use of anticoagulants: Secondary | ICD-10-CM | POA: Insufficient documentation

## 2021-07-04 DIAGNOSIS — Z955 Presence of coronary angioplasty implant and graft: Secondary | ICD-10-CM | POA: Diagnosis not present

## 2021-07-04 DIAGNOSIS — I471 Supraventricular tachycardia: Secondary | ICD-10-CM | POA: Insufficient documentation

## 2021-07-04 DIAGNOSIS — R0609 Other forms of dyspnea: Secondary | ICD-10-CM | POA: Insufficient documentation

## 2021-07-04 DIAGNOSIS — I272 Pulmonary hypertension, unspecified: Secondary | ICD-10-CM | POA: Insufficient documentation

## 2021-07-04 DIAGNOSIS — Z7902 Long term (current) use of antithrombotics/antiplatelets: Secondary | ICD-10-CM | POA: Diagnosis not present

## 2021-07-04 DIAGNOSIS — T82855A Stenosis of coronary artery stent, initial encounter: Secondary | ICD-10-CM | POA: Diagnosis not present

## 2021-07-04 DIAGNOSIS — Z79899 Other long term (current) drug therapy: Secondary | ICD-10-CM | POA: Insufficient documentation

## 2021-07-04 DIAGNOSIS — Z7984 Long term (current) use of oral hypoglycemic drugs: Secondary | ICD-10-CM | POA: Diagnosis not present

## 2021-07-04 DIAGNOSIS — I5033 Acute on chronic diastolic (congestive) heart failure: Secondary | ICD-10-CM | POA: Diagnosis not present

## 2021-07-04 HISTORY — PX: RIGHT/LEFT HEART CATH AND CORONARY ANGIOGRAPHY: CATH118266

## 2021-07-04 HISTORY — PX: CORONARY BALLOON ANGIOPLASTY: CATH118233

## 2021-07-04 LAB — POCT I-STAT EG7
Acid-Base Excess: 0 mmol/L (ref 0.0–2.0)
Acid-Base Excess: 0 mmol/L (ref 0.0–2.0)
Acid-base deficit: 1 mmol/L (ref 0.0–2.0)
Bicarbonate: 24.8 mmol/L (ref 20.0–28.0)
Bicarbonate: 24.8 mmol/L (ref 20.0–28.0)
Bicarbonate: 25.3 mmol/L (ref 20.0–28.0)
Calcium, Ion: 1.17 mmol/L (ref 1.15–1.40)
Calcium, Ion: 1.19 mmol/L (ref 1.15–1.40)
Calcium, Ion: 1.23 mmol/L (ref 1.15–1.40)
HCT: 31 % — ABNORMAL LOW (ref 39.0–52.0)
HCT: 32 % — ABNORMAL LOW (ref 39.0–52.0)
HCT: 32 % — ABNORMAL LOW (ref 39.0–52.0)
Hemoglobin: 10.5 g/dL — ABNORMAL LOW (ref 13.0–17.0)
Hemoglobin: 10.9 g/dL — ABNORMAL LOW (ref 13.0–17.0)
Hemoglobin: 10.9 g/dL — ABNORMAL LOW (ref 13.0–17.0)
O2 Saturation: 74 %
O2 Saturation: 74 %
O2 Saturation: 96 %
Potassium: 3.7 mmol/L (ref 3.5–5.1)
Potassium: 3.7 mmol/L (ref 3.5–5.1)
Potassium: 3.8 mmol/L (ref 3.5–5.1)
Sodium: 142 mmol/L (ref 135–145)
Sodium: 143 mmol/L (ref 135–145)
Sodium: 143 mmol/L (ref 135–145)
TCO2: 26 mmol/L (ref 22–32)
TCO2: 26 mmol/L (ref 22–32)
TCO2: 27 mmol/L (ref 22–32)
pCO2, Ven: 42.3 mmHg — ABNORMAL LOW (ref 44–60)
pCO2, Ven: 42.6 mmHg — ABNORMAL LOW (ref 44–60)
pCO2, Ven: 43.5 mmHg — ABNORMAL LOW (ref 44–60)
pH, Ven: 7.372 (ref 7.25–7.43)
pH, Ven: 7.373 (ref 7.25–7.43)
pH, Ven: 7.376 (ref 7.25–7.43)
pO2, Ven: 40 mmHg (ref 32–45)
pO2, Ven: 41 mmHg (ref 32–45)
pO2, Ven: 85 mmHg — ABNORMAL HIGH (ref 32–45)

## 2021-07-04 LAB — POCT ACTIVATED CLOTTING TIME
Activated Clotting Time: 239 seconds
Activated Clotting Time: 281 seconds
Activated Clotting Time: 293 seconds

## 2021-07-04 LAB — GLUCOSE, CAPILLARY: Glucose-Capillary: 142 mg/dL — ABNORMAL HIGH (ref 70–99)

## 2021-07-04 SURGERY — RIGHT/LEFT HEART CATH AND CORONARY ANGIOGRAPHY
Anesthesia: LOCAL

## 2021-07-04 MED ORDER — IOHEXOL 350 MG/ML SOLN
INTRAVENOUS | Status: DC | PRN
  Administered 2021-07-04: 160 mL via INTRA_ARTERIAL

## 2021-07-04 MED ORDER — MIDAZOLAM HCL 2 MG/2ML IJ SOLN
INTRAMUSCULAR | Status: AC
  Filled 2021-07-04: qty 2

## 2021-07-04 MED ORDER — NITROGLYCERIN 0.4 MG SL SUBL: 0.4000 mg | SUBLINGUAL_TABLET | SUBLINGUAL | 2 refills | Status: DC | PRN

## 2021-07-04 MED ORDER — SODIUM CHLORIDE 0.9% FLUSH: 3.0000 mL | INTRAVENOUS | Status: DC | PRN

## 2021-07-04 MED ORDER — LIDOCAINE HCL (PF) 1 % IJ SOLN
INTRAMUSCULAR | Status: DC | PRN
  Administered 2021-07-04: 4 mL
  Administered 2021-07-04: 2 mL

## 2021-07-04 MED ORDER — FUROSEMIDE 10 MG/ML IJ SOLN
20.0000 mg | Freq: Once | INTRAMUSCULAR | Status: AC
  Administered 2021-07-04: 20 mg via INTRAVENOUS
  Filled 2021-07-04: qty 4

## 2021-07-04 MED ORDER — HEPARIN SODIUM (PORCINE) 1000 UNIT/ML IJ SOLN
INTRAMUSCULAR | Status: AC
  Filled 2021-07-04: qty 10

## 2021-07-04 MED ORDER — VERAPAMIL HCL 2.5 MG/ML IV SOLN
INTRAVENOUS | Status: AC
Start: 2021-07-04 — End: ?
  Filled 2021-07-04: qty 2

## 2021-07-04 MED ORDER — SODIUM CHLORIDE 0.9 % IV SOLN: 250.0000 mL | INTRAVENOUS | Status: DC | PRN

## 2021-07-04 MED ORDER — FENTANYL CITRATE (PF) 100 MCG/2ML IJ SOLN
INTRAMUSCULAR | Status: AC
  Filled 2021-07-04: qty 2

## 2021-07-04 MED ORDER — LABETALOL HCL 5 MG/ML IV SOLN: 10.0000 mg | INTRAVENOUS | Status: DC | PRN

## 2021-07-04 MED ORDER — SODIUM CHLORIDE 0.9% FLUSH: 3.0000 mL | Freq: Two times a day (BID) | INTRAVENOUS | Status: DC

## 2021-07-04 MED ORDER — FENTANYL CITRATE (PF) 100 MCG/2ML IJ SOLN
INTRAMUSCULAR | Status: DC | PRN
  Administered 2021-07-04: 25 ug via INTRAVENOUS

## 2021-07-04 MED ORDER — POTASSIUM CHLORIDE CRYS ER 20 MEQ PO TBCR: EXTENDED_RELEASE_TABLET | ORAL | 1 refills | Status: DC

## 2021-07-04 MED ORDER — FUROSEMIDE 20 MG PO TABS: 20.0000 mg | ORAL_TABLET | Freq: Every day | ORAL | 3 refills | Status: DC

## 2021-07-04 MED ORDER — VERAPAMIL HCL 2.5 MG/ML IV SOLN
INTRAVENOUS | Status: DC | PRN
  Administered 2021-07-04: 10 mL via INTRA_ARTERIAL

## 2021-07-04 MED ORDER — HEPARIN SODIUM (PORCINE) 1000 UNIT/ML IJ SOLN
INTRAMUSCULAR | Status: DC | PRN
  Administered 2021-07-04: 5000 [IU] via INTRAVENOUS
  Administered 2021-07-04: 3000 [IU] via INTRAVENOUS
  Administered 2021-07-04: 5000 [IU] via INTRAVENOUS
  Administered 2021-07-04: 4000 [IU] via INTRAVENOUS

## 2021-07-04 MED ORDER — HEPARIN (PORCINE) IN NACL 1000-0.9 UT/500ML-% IV SOLN
INTRAVENOUS | Status: DC | PRN
  Administered 2021-07-04 (×2): 500 mL

## 2021-07-04 MED ORDER — NITROGLYCERIN 1 MG/10 ML FOR IR/CATH LAB
INTRA_ARTERIAL | Status: AC
  Filled 2021-07-04: qty 10

## 2021-07-04 MED ORDER — NITROGLYCERIN 1 MG/10 ML FOR IR/CATH LAB
INTRA_ARTERIAL | Status: DC | PRN
  Administered 2021-07-04: 200 ug via INTRACORONARY

## 2021-07-04 MED ORDER — SODIUM CHLORIDE 0.9 % WEIGHT BASED INFUSION
3.0000 mL/kg/h | INTRAVENOUS | Status: AC
  Administered 2021-07-04: 3 mL/kg/h via INTRAVENOUS

## 2021-07-04 MED ORDER — ACETAMINOPHEN 325 MG PO TABS: 650.0000 mg | ORAL_TABLET | ORAL | Status: DC | PRN

## 2021-07-04 MED ORDER — ONDANSETRON HCL 4 MG/2ML IJ SOLN: 4.0000 mg | Freq: Four times a day (QID) | INTRAMUSCULAR | Status: DC | PRN

## 2021-07-04 MED ORDER — DILTIAZEM HCL ER COATED BEADS 360 MG PO CP24: 360.0000 mg | ORAL_CAPSULE | Freq: Every day | ORAL | 1 refills | Status: DC

## 2021-07-04 MED ORDER — SODIUM CHLORIDE 0.9 % WEIGHT BASED INFUSION: 1.0000 mL/kg/h | INTRAVENOUS | Status: DC

## 2021-07-04 MED ORDER — LIDOCAINE HCL (PF) 1 % IJ SOLN
INTRAMUSCULAR | Status: AC
  Filled 2021-07-04: qty 30

## 2021-07-04 MED ORDER — HYDRALAZINE HCL 20 MG/ML IJ SOLN: 10.0000 mg | INTRAMUSCULAR | Status: DC | PRN

## 2021-07-04 MED ORDER — SODIUM CHLORIDE 0.9 % IV SOLN: INTRAVENOUS | Status: DC

## 2021-07-04 MED ORDER — HEPARIN (PORCINE) IN NACL 1000-0.9 UT/500ML-% IV SOLN
INTRAVENOUS | Status: AC
  Filled 2021-07-04: qty 1000

## 2021-07-04 MED ORDER — ASPIRIN 81 MG PO CHEW: 81.0000 mg | CHEWABLE_TABLET | Freq: Once | ORAL | Status: DC

## 2021-07-04 MED ORDER — MIDAZOLAM HCL 2 MG/2ML IJ SOLN
INTRAMUSCULAR | Status: DC | PRN
  Administered 2021-07-04: 1 mg via INTRAVENOUS

## 2021-07-04 SURGICAL SUPPLY — 28 items
BALLN SAPPHIRE 2.0X12 (BALLOONS) ×4 IMPLANT
BALLN SAPPHIRE 2.5X15 (BALLOONS) ×2 IMPLANT
BALLN ~~LOC~~ EMERGE MR 2.5X8 (BALLOONS) ×2 IMPLANT
BALLN ~~LOC~~ EUPHORA RX 2.5X15 (BALLOONS) ×2 IMPLANT
BALLOON SAPPHIRE 2.0X12 (BALLOONS) IMPLANT
BALLOON SAPPHIRE 2.5X15 (BALLOONS) IMPLANT
BALLOON ~~LOC~~ EMERGE MR 2.5X8 (BALLOONS) IMPLANT
BALLOON ~~LOC~~ EUPHORA RX 2.5X15 (BALLOONS) IMPLANT
BAND CMPR LRG ZPHR (HEMOSTASIS) ×1
BAND ZEPHYR COMPRESS 30 LONG (HEMOSTASIS) ×1 IMPLANT
CATH BALLN WEDGE 5F 110CM (CATHETERS) ×1 IMPLANT
CATH INFINITI 5FR JL4 (CATHETERS) ×1 IMPLANT
CATH OPTITORQUE TIG 4.0 5F (CATHETERS) ×1 IMPLANT
CATH VISTA GUIDE 6FR XBLAD3.5 (CATHETERS) ×1 IMPLANT
GLIDESHEATH SLEND SS 6F .021 (SHEATH) ×1 IMPLANT
GUIDEWIRE .025 260CM (WIRE) ×1 IMPLANT
GUIDEWIRE INQWIRE 1.5J.035X260 (WIRE) IMPLANT
INQWIRE 1.5J .035X260CM (WIRE) ×2 IMPLANT
KIT ENCORE 26 ADVANTAGE (KITS) ×2 IMPLANT
KIT HEART LEFT (KITS) ×3 IMPLANT
PACK CARDIAC CATHETERIZATION (CUSTOM PROCEDURE TRAY) ×3 IMPLANT
SHEATH GLIDE SLENDER 4/5FR (SHEATH) ×1 IMPLANT
SHEATH PROBE COVER 6X72 (BAG) ×1 IMPLANT
TRANSDUCER W/STOPCOCK (MISCELLANEOUS) ×3 IMPLANT
TUBING CIL FLEX 10 FLL-RA (TUBING) ×3 IMPLANT
WIRE ASAHI PROWATER 180CM (WIRE) ×1 IMPLANT
WIRE HI TORQ WHISPER MS 190CM (WIRE) ×1 IMPLANT
WIRE RUNTHROUGH .014X180CM (WIRE) ×1 IMPLANT

## 2021-07-04 NOTE — Discharge Summary (Signed)
?Discharge Summary for Same Day PCI  ? ?Patient ID: Kenneth [name] ?MRN: [id number]; DOB: [birth year] ? ?Admit date: 07/04/2021 ?Discharge date: 07/04/2021 ? ?Primary Care Provider: Landry Mellow, MD  ?Primary Cardiologist: [name], MD  ?Primary Electrophysiologist:  None  ? ?Discharge Diagnoses  ?  ?Active Problems: ?  Chronic heart failure with preserved ejection fraction (HFpEF) ([place]) ?  ILD (interstitial lung disease) ([place]) ?  Coronary artery disease involving native coronary artery with angina pectoris ([place]) ?  Exertional dyspnea - Class III Angina ?  CAD S/P percutaneous coronary angioplasty ?  Acute on chronic diastolic heart failure ([place]) ?  S/P PTCA (percutaneous transluminal coronary angioplasty) ? ?Diagnostic Studies/Procedures  ?  ?Cardiac Catheterization 07/04/2021: ? ?Dist Cx lesion is 45% stenosed. ?  Mid LAD to Dist LAD overlapping stents have 75% stenosed just distal to2nd Diag with 70% stenosed side branch in 2nd Diag. ?  RPDA lesion is 60% stenosed. ?  Previously placed Mid RCA stent (unknown type) is  widely patent. ?  Prox LAD to Mid LAD stent is widely patent ?  Mid RCA stent was previously treated. ?  Balloon angioplasty was performed using a BALLN SAPPHIRE 2.0X12. ?  Balloon angioplasty was performed using a BALLN [place] EMERGE MR 2.5X8. ?  Post intervention, there is a 25% residual stenosis. ?  Post intervention, the side branch was reduced to 40% residual stenosis. ?  The left ventricular systolic function is normal. ?  LV end diastolic pressure is moderately elevated. ?  Hemodynamic findings consistent with mild pulmonary hypertension. ?  There is moderate aortic valve stenosis. ?  ?SUMMARY ?Stable two-vessel disease with exception of progression of in-stent restenosis disease in the stented portion of the LAD-2nd Diag is back to the LAD and branch with widely patent stent in the RCA. ?Culprit lesion is likely the LAD -2nd Diag bifurcation lesion which is in-stent restenosis (25% in the  LAD and jailed sidebranch 70% stenosis of the 2nd Diag.  ?Successful [place] balloon PTCA of the LAD ISR with semicompliant balloon PTCA kissing balloon into the 2nd Diag side branch -> reducing lesions to 25% LAD, 30% diagonal with TIMI-3 flow preserved. ?Borderline Pulmonary Hypertension with mean PA pressure of 24 mmHg in the setting of LVEDP of 12 mmHg and PCWP pressure of 28 mmHg. ?Normal Cardiac Output-Index by Fick: 9.2-4.0. ?At least moderate aortic stenosis with mean gradient in the Cath Lab at 33 million mercury, higher than echo gradient of 26 mm reviewed. ?Borderline systolic blood pressures. ?  ?  ?Recommendations: ?With moderately elevated LVEDP, will have him increase his furosemide dose to 40 mg on Monday Wednesday Friday (with supplemental potassium K-Dur 20 mEq) ?Likely follow-up echocardiogram in 6 months to reassess severity of aortic stenosis. ?Restart DOAC this evening roughly 1800 -> will discharge on Eliquis and Plavix without aspirin ?May need to reduce antihypertensive agents due to low blood pressures in the Cath Lab. ?  ?Follow-up as scheduled ?  ?[name], MD ?  ?Diagnostic ?Dominance: Right ?Intervention ? ? ?_____________ ?  ?History of Present Illness   ?  ?Kenneth [name] is a 76 y.o. male with PMH of HFpEF, HLD, DM, HTN, atrial tachycardia, mild aortic stenosis, ILD who is followed by Dr. Ellyn Hack as an outpatient. He complained of increased shortness of breath with symptoms concerning for angina.  ? ?Cardiac catheterization was arranged for further evaluation. ? ?Hospital Course  ?   ?The patient underwent cardiac cath as noted above with stable 2v disease  with ISR of LAD treated with successful balloon PTCA along with angioplasty into the 2nd diag. Borderline pulmonary HTN with mean PA pressure of 61mHg. Moderate aortic stenosis. Plan for DAPT with Plavix/Eliquis for at least 6 months. The patient was seen by cardiac rehab while in short stay. There were no observed complications  post cath. Radial cath site was re-evaluated prior to discharge and found to be stable without any complications. Instructions/precautions regarding cath site care were given prior to discharge. ? ?Kenneth CLAYSONwas seen by Dr. HEllyn Hackand determined stable for discharge home. Follow up with our office has been arranged. Medications are listed below. Pertinent changes include lasix was increased to '40mg'$  MWF with K+ supplement. ? ?_____________ ? ?Cath/PCI Registry Performance & Quality Measures: ?Aspirin prescribed? - No - needs OPopponesset?ADP Receptor Inhibitor (Plavix/Clopidogrel, Brilinta/Ticagrelor or Effient/Prasugrel) prescribed (includes medically managed patients)? - Yes ?High Intensity Statin (Lipitor 40-'80mg'$  or Crestor 20-'40mg'$ ) prescribed? - Yes ?For EF <40%, was ACEI/ARB prescribed? - Not Applicable (EF >/= 449% ?For EF <40%, Aldosterone Antagonist (Spironolactone or Eplerenone) prescribed? - Not Applicable (EF >/= 482% ?Cardiac Rehab Phase II ordered (Included Medically managed Patients)? - Yes ? ?_____________ ? ? ?Discharge Vitals ?Blood pressure 124/67, pulse (!) 57, temperature 98 ?F (36.7 ?C), temperature source Oral, resp. rate 19, height '6\' 3"'$  (1.905 m), weight 101.2 kg, SpO2 97 %.  ?Danley DankerWeights  ? 07/04/21 0745  ?Weight: 101.2 kg  ? ? ?Last Labs & Radiologic Studies  ?  ?CBC ?No results for input(s): WBC, NEUTROABS, HGB, HCT, MCV, PLT in the last 72 hours. ?Basic Metabolic Panel ?No results for input(s): NA, K, CL, CO2, GLUCOSE, BUN, CREATININE, CALCIUM, MG, PHOS in the last 72 hours. ?Liver Function Tests ?No results for input(s): AST, ALT, ALKPHOS, BILITOT, PROT, ALBUMIN in the last 72 hours. ?No results for input(s): LIPASE, AMYLASE in the last 72 hours. ?High Sensitivity Troponin:   ?No results for input(s): TROPONINIHS in the last 720 hours.  ?BNP ?Invalid input(s): POCBNP ?D-Dimer ?No results for input(s): DDIMER in the last 72 hours. ?Hemoglobin A1C ?No results for input(s): HGBA1C in the  last 72 hours. ?Fasting Lipid Panel ?No results for input(s): CHOL, HDL, LDLCALC, TRIG, CHOLHDL, LDLDIRECT in the last 72 hours. ?Thyroid Function Tests ?No results for input(s): TSH, T4TOTAL, T3FREE, THYROIDAB in the last 72 hours. ? ?Invalid input(s): FREET3 ?_____________  ?CARDIAC CATHETERIZATION ? ?Result Date: 07/04/2021 ?  Dist Cx lesion is 45% stenosed.   Mid LAD to Dist LAD overlapping stents have 75% stenosed just distal to2nd Diag with 70% stenosed side branch in 2nd Diag.   RPDA lesion is 60% stenosed.   Previously placed Mid RCA stent (unknown type) is  widely patent.   Prox LAD to Mid LAD stent is widely patent   Mid RCA stent was previously treated.   Balloon angioplasty was performed using a BALLN SAPPHIRE 2.0X12.   Balloon angioplasty was performed using a BALLN [place] EMERGE MR 2.5X8.   Post intervention, there is a 25% residual stenosis.   Post intervention, the side branch was reduced to 40% residual stenosis.   The left ventricular systolic function is normal.   LV end diastolic pressure is moderately elevated.   Hemodynamic findings consistent with mild pulmonary hypertension.   There is moderate aortic valve stenosis. SUMMARY Stable two-vessel disease with exception of progression of in-stent restenosis disease in the stented portion of the LAD-2nd Diag is back to the LAD and branch with widely patent stent in the  RCA. Culprit lesion is likely the LAD -2nd Diag bifurcation lesion which is in-stent restenosis (25% in the LAD and jailed sidebranch 70% stenosis of the 2nd Diag. Successful [place] balloon PTCA of the LAD ISR with semicompliant balloon PTCA kissing balloon into the 2nd Diag side branch -> reducing lesions to 25% LAD, 30% diagonal with TIMI-3 flow preserved. Borderline Pulmonary Hypertension with mean PA pressure of 24 mmHg in the setting of LVEDP of 12 mmHg and PCWP pressure of 28 mmHg. Normal Cardiac Output-Index by Fick: 9.2-4.0. At least moderate aortic stenosis with mean gradient in the  Cath Lab at 33 million mercury, higher than echo gradient of 26 mm reviewed. Borderline systolic blood pressures. Recommendations: With moderately elevated LVEDP, will have him increase his furosemide

## 2021-07-04 NOTE — Progress Notes (Signed)
CARDIAC REHAB PHASE I  ? ?PTCA education completed with pt and family. Pt educated on importance of ASA and Eliquis. Pt given heart healthy and diabetic diets. Reviewed site care, restrictions, and exercise guidelines. Will refer to CRP II [place] with knowledge pt is not interested in attending. ? ?[id number] ?Rufina Falco, RN BSN ?07/04/2021 ?3:01 PM ? ? ?

## 2021-07-04 NOTE — Interval H&P Note (Signed)
History and Physical Interval Note: ? ?07/04/2021 ?10:15 AM ? ?Kenneth [name]  has presented today for surgery, with the diagnosis of acute onchronic diastolic heart failure; known CAD with atypical angina.  The various methods of treatment have been discussed with the patient and family. After consideration of risks, benefits and other options for treatment, the patient has consented to  Procedure(s): ?RIGHT/LEFT HEART CATH AND CORONARY ANGIOGRAPHY (N/A)  ?PERCUTANEOUS CORONARY INTERVENTION ? ?as a surgical intervention.  The patient's history has been reviewed, patient examined, no change in status, stable for surgery.  I have reviewed the patient's chart and labs.  Questions were answered to the patient's satisfaction.   ? ?Cath Lab Visit (complete for each Cath Lab visit) ? ?Clinical Evaluation Leading to the Procedure:  ? ?ACS: No. ? ?Non-ACS:   ? ?Anginal Classification: CCS IV ? ?Anti-ischemic medical therapy: Maximal Therapy (2 or more classes of medications) ? ?Non-Invasive Test Results: Equivocal test results - NON-ISCHEMIC TEST with persistent & progressively worsening symptoms ? ?Prior CABG: No previous CABG ? ? ? ?Kenneth [name] ? ? ?

## 2021-07-05 ENCOUNTER — Encounter (HOSPITAL_COMMUNITY): Payer: Self-pay | Admitting: Cardiology

## 2021-07-25 ENCOUNTER — Ambulatory Visit (INDEPENDENT_AMBULATORY_CARE_PROVIDER_SITE_OTHER): Payer: Medicare Other | Admitting: Cardiology

## 2021-07-25 ENCOUNTER — Encounter: Payer: Self-pay | Admitting: Cardiology

## 2021-07-25 VITALS — BP 115/58 | HR 64 | Ht 75.0 in | Wt 232.6 lb

## 2021-07-25 DIAGNOSIS — I2510 Atherosclerotic heart disease of native coronary artery without angina pectoris: Secondary | ICD-10-CM | POA: Diagnosis not present

## 2021-07-25 DIAGNOSIS — I350 Nonrheumatic aortic (valve) stenosis: Secondary | ICD-10-CM | POA: Diagnosis not present

## 2021-07-25 DIAGNOSIS — Z9861 Coronary angioplasty status: Secondary | ICD-10-CM

## 2021-07-25 DIAGNOSIS — R0609 Other forms of dyspnea: Secondary | ICD-10-CM

## 2021-07-25 DIAGNOSIS — I5032 Chronic diastolic (congestive) heart failure: Secondary | ICD-10-CM | POA: Diagnosis not present

## 2021-07-25 DIAGNOSIS — J849 Interstitial pulmonary disease, unspecified: Secondary | ICD-10-CM | POA: Diagnosis not present

## 2021-07-25 DIAGNOSIS — D6869 Other thrombophilia: Secondary | ICD-10-CM

## 2021-07-25 DIAGNOSIS — E785 Hyperlipidemia, unspecified: Secondary | ICD-10-CM

## 2021-07-25 DIAGNOSIS — I484 Atypical atrial flutter: Secondary | ICD-10-CM | POA: Diagnosis not present

## 2021-07-25 DIAGNOSIS — I491 Atrial premature depolarization: Secondary | ICD-10-CM

## 2021-07-25 DIAGNOSIS — I480 Paroxysmal atrial fibrillation: Secondary | ICD-10-CM | POA: Diagnosis not present

## 2021-07-25 DIAGNOSIS — I10 Essential (primary) hypertension: Secondary | ICD-10-CM | POA: Diagnosis not present

## 2021-07-25 DIAGNOSIS — I25119 Atherosclerotic heart disease of native coronary artery with unspecified angina pectoris: Secondary | ICD-10-CM | POA: Diagnosis not present

## 2021-07-25 DIAGNOSIS — E1169 Type 2 diabetes mellitus with other specified complication: Secondary | ICD-10-CM

## 2021-07-25 MED ORDER — RANOLAZINE ER 500 MG PO TB12: 500.0000 mg | ORAL_TABLET | Freq: Two times a day (BID) | ORAL | 3 refills | Status: DC

## 2021-07-25 MED ORDER — DILTIAZEM HCL ER COATED BEADS 180 MG PO CP24: 180.0000 mg | ORAL_CAPSULE | Freq: Every day | ORAL | 3 refills | Status: DC

## 2021-07-25 NOTE — Progress Notes (Signed)
? ? ? ?Primary Care Provider: Landry Mellow, MD ?Cardiologist: [name], MD ?Electrophysiologist: None ? ?Clinic Note: ?Chief Complaint  ?Patient presents with  ? Hospitalization Follow-up  ?  Post cath follow-up.  No more chest pain and breathing is better, still tired  ? Coronary Artery Disease  ?  Status post PTCA in-stent restenosis, angina improved, but still fatigued.  ? Congestive Heart Failure  ?  Diastolic dysfunction related heart failure.  Elevated LVEDP.  We discussed sliding scale Lasix.  ? ? ?=================================== ? ?ASSESSMENT/PLAN  ? ?Problem List Items Addressed This Visit   ? ?  ? Cardiology Problems  ? Coronary artery disease involving native coronary artery with angina pectoris ([place]) - Primary (Chronic)  ?  Following PCI, he is angina symptoms seem to have improved as is his dyspnea, but still has fatigue with some exertional dyspnea.  I think there is a definite diastolic heart failure component as well as underlying lung disease. ? ?He is currently on beta-blocker and calcium channel blocker for rate and anginal control.  Reducing diltiazem dose to 180 mg today. ?He is on [place] and statin as well as daily dose of Lasix-with intermittent increased doses and sliding scale.Kenneth [name] ?On Plavix plus Eliquis with no major bleeding issues. ?  ?  ? Relevant Medications  ? diltiazem (CARDIZEM CD) 180 MG 24 hr capsule  ? ranolazine (RANEXA) 500 MG 12 hr tablet  ? Other Relevant Orders  ? EKG 12-Lead (Completed)  ? Chronic heart failure with preserved ejection fraction (HFpEF) (HCC) (Chronic)  ?  Chronic HFpEF with at least grade 2 diastolic dysfunction and on echo and elevated pressures noted in the Cath Lab.  This is despite having relatively low blood pressures. ? ?He is on Toprol and we are reducing his dose of diltiazem, potentially we could add some afterload reduction by further weaning off diltiazem intensity. ?He is also on [place] for combined diabetes and CHF effect. ? ?His  weight is up today which is unusual, so we we discussed the importance of sliding scale Lasix.  He is taking his daily Lasix, and did note some improvement with diuresis after increasing his dose a couple days.  I explained that this is the whole reason for having sliding scale.  He is scheduled to take an additional dose on Monday Wednesday Friday but it is okay to take additional doses if necessary on either scheduled or nonscheduled days for weight gain or worsening dyspnea/edema.. ?  ?  ? Relevant Medications  ? diltiazem (CARDIZEM CD) 180 MG 24 hr capsule  ? ranolazine (RANEXA) 500 MG 12 hr tablet  ? Other Relevant Orders  ? EKG 12-Lead (Completed)  ? Atypical atrial flutter (HCC) (Chronic)  ?  Status post redo ablation in 2015 with no recurrences further detail.  Had been off of DOAC until he had A-fib. ? ?  ?  ? Relevant Medications  ? diltiazem (CARDIZEM CD) 180 MG 24 hr capsule  ? ranolazine (RANEXA) 500 MG 12 hr tablet  ? Hyperlipidemia associated with type 2 diabetes mellitus (HCC) (Chronic)  ?  Due for recheck labs.  His most recent LDL was 70 on current dose of statin.  With his fatigue, I am reluctant to push any further.   ? ?Continue 20 g of statin. ? ?  ?  ? Relevant Medications  ? diltiazem (CARDIZEM CD) 180 MG 24 hr capsule  ? ranolazine (RANEXA) 500 MG 12 hr tablet  ? Moderate aortic stenosis by prior echocardiogram (  Chronic)  ?  Echo read the mean aortic valve gradient of 26, but on cath was 33.  Probably need to reassess a little closer.  It is possible that at a minimum diastolic dysfunction from the aortic stenosis could be leading to his symptoms.  He is not yet at the level where we can consider referral for TAVR, but is getting close. ? ?I would like to see how he does with adjusting his antianginals and treating his diastolic function, but will need to check echo prior to follow-up visit.  (This was not ordered, but we will put the order in). ? ?If there is progression with mean gradient  in the mid 30s, I think it would be reasonable to consider referral to the TAVR team, even though that does not necessarily meet criteria for severe stenosis.  Usually 1 would expect this to happen with reduced EF, but his EF is stable.. ? ?Plan: We will order 2D echocardiogram to be performed in the week or so prior to follow-up visit. ? ?  ?  ? Relevant Medications  ? diltiazem (CARDIZEM CD) 180 MG 24 hr capsule  ? ranolazine (RANEXA) 500 MG 12 hr tablet  ? Paroxysmal atrial fibrillation (HCC) (Chronic)  ?  This history of atrial flutter with ablation years ago, has now had a history of A-fib with no obvious breakthrough spells.  As above, will try to titrate up his diltiazem dose for some antianginal benefit as well as knocking down PACs.  Unfortunately, he is noticing progressive fatigue, despite improved breathing from PCI. ? ?Plan: Convert back to 180 mg daily diltiazem along with Toprol. ?Remains on Eliquis for DOAC along with Plavix.  Not on aspirin. ? ?  ?  ? Relevant Medications  ? diltiazem (CARDIZEM CD) 180 MG 24 hr capsule  ? ranolazine (RANEXA) 500 MG 12 hr tablet  ? Other Relevant Orders  ? EKG 12-Lead (Completed)  ? CAD S/P percutaneous coronary angioplasty (Chronic)  ?  With notable ISR in the LAD, I stressed the importance of now needing to be on both Eliquis and clopidogrel. ? ?He needs uninterrupted Plavix for at least 3 months post PTCA, at that point (as of July 2023) would be okay to hold Plavix for procedures or surgeries-5 to 7 days preop. ?  ?  ? Relevant Medications  ? diltiazem (CARDIZEM CD) 180 MG 24 hr capsule  ? ranolazine (RANEXA) 500 MG 12 hr tablet  ? Essential hypertension (Chronic)  ?  His blood pressure actually is pretty low in the Cath Lab which is another reason for reducing his diltiazem dose. ? ?Plan: Reduce diltiazem to 180 mg daily and continue current dose of Toprol. ?May need to consider afterload reduction if pressures go back up. ?p-> could conceivably fully DC  diltiazem in lieu of adding ARB at follow-up visit pending echo results. ? ?Continue standing dose of Lasix and we discussed sliding scale. ? ?  ?  ? Relevant Medications  ? diltiazem (CARDIZEM CD) 180 MG 24 hr capsule  ? ranolazine (RANEXA) 500 MG 12 hr tablet  ? Hypercoagulable state due to paroxysmal atrial fibrillation (HCC) (Chronic)  ?  Stable on Eliquis. ->  With no recurrent symptoms of A-fib that I can tell, okay to hold Eliquis 2 to 3 days preop for surgical procedures. ? ?This patients CHA2DS2-VASc Score and unadjusted Ischemic Stroke Rate (% per year) is equal to 7.2 % stroke rate/year from a score of 5 ? ?Above score calculated as 1  point each if present [CHF, HTN, DM, Vascular=MI/PAD/Aortic Plaque, Age if 58-74, or Male] ?Above score calculated as 2 points each if present [Age > 75, or Stroke/TIA/TE] ? ? ?  ?  ? Relevant Medications  ? diltiazem (CARDIZEM CD) 180 MG 24 hr capsule  ? ranolazine (RANEXA) 500 MG 12 hr tablet  ? Premature atrial complexes (Chronic)  ?  PACs noted on monitor with history of PAF.  I tried to increase his diltiazem dose, but he has had more prominent fatigue.  As such, I think we can reduce it back down again.  Did not seem to help much in the way of symptoms. ? ?Plan: Reduce diltiazem to 180 mg daily. ?Continue current dose of beta-blocker with Toprol at 25 mg, but low threshold to consider switching to acebutolol if fatigue symptoms worsen. ? ?  ?  ? Relevant Medications  ? diltiazem (CARDIZEM CD) 180 MG 24 hr capsule  ? ranolazine (RANEXA) 500 MG 12 hr tablet  ?  ? Other  ? ILD (interstitial lung disease) (HCC) (Chronic)  ?  Followed by pulmonary medicine.  I do suspect that some of his dyspnea is stress/panic attack related, but still has profound dyspnea just at rest which cannot be explained by his cath findings.  We will continue to treat his diastolic heart failure and are reevaluating his valve. ? ?  ?  ? Exertional dyspnea - Class III Angina (Chronic)  ?  Notable  improvement in his chest tightness and exertional dyspnea symptoms following PTCA, but just now notices low energy.  I asked on several occasions if the chest tightness and pressure and dyspnea was improved.

## 2021-07-25 NOTE — Patient Instructions (Addendum)
Medication Instructions:  ? ? Stop taking Diltiazem 360 mg ? ?Start taking Diltiazem 180 mg one tablet daily  ? ?Start taking Ranolazine ( Ranexa)  500 mg  twice a day ( close to 12 hours apart as possible)  ? ? ?*If you need a refill on your cardiac medications before your next appointment, please call your pharmacy* ? ? ?Lab Work: ?Not needed ? ? ? ?Testing/Procedures: ?Not needed ? ? ?Follow-Up: ?At [facility], you and your health needs are our priority.  As part of our continuing mission to provide you with exceptional heart care, we have created designated Provider Care Teams.  These Care Teams include your primary Cardiologist (physician) and Advanced Practice Providers (APPs -  Physician Assistants and Nurse Practitioners) who all work together to provide you with the care you need, when you need it. ? ?We recommend signing up for the patient portal called "MyChart".  Sign up information is provided on this After Visit Summary.  MyChart is used to connect with patients for Virtual Visits (Telemedicine).  Patients are able to view lab/test results, encounter notes, upcoming appointments, etc.  Non-urgent messages can be sent to your provider as well.   ?To learn more about what you can do with MyChart, go to [email or web address].   ? ?Your next appointment:   ?5 month(s) ? ?The format for your next appointment:   ?In Person ? ?Provider:   ?[name], MD   ? ? ? ? ?Important Information About [place] ? ? ? ? ?  ?

## 2021-07-29 ENCOUNTER — Encounter: Payer: Self-pay | Admitting: Cardiology

## 2021-07-29 NOTE — Assessment & Plan Note (Signed)
Status post redo ablation in 2015 with no recurrences further detail.  Had been off of DOAC until he had A-fib. ?

## 2021-07-29 NOTE — Assessment & Plan Note (Signed)
Due for recheck labs.  His most recent LDL was 70 on current dose of statin.  With his fatigue, I am reluctant to push any further.   ? ?Continue 20 g of statin. ?

## 2021-07-29 NOTE — Assessment & Plan Note (Addendum)
Echo read the mean aortic valve gradient of 26, but on cath was 33.  Probably need to reassess a little closer.  It is possible that at a minimum diastolic dysfunction from the aortic stenosis could be leading to his symptoms.  He is not yet at the level where we can consider referral for TAVR, but is getting close. ? ?I would like to see how he does with adjusting his antianginals and treating his diastolic function, but will need to check echo prior to follow-up visit.  (This was not ordered, but we will put the order in). ? ?If there is progression with mean gradient in the mid 30s, I think it would be reasonable to consider referral to the TAVR team, even though that does not necessarily meet criteria for severe stenosis.  Usually 1 would expect this to happen with reduced EF, but his EF is stable.. ? ?Plan: We will order 2D echocardiogram to be performed in the week or so prior to follow-up visit. ?

## 2021-07-29 NOTE — Assessment & Plan Note (Signed)
With notable ISR in the LAD, I stressed the importance of now needing to be on both Eliquis and clopidogrel. ? ?? He needs uninterrupted Plavix for at least 3 months post PTCA, at that point (as of July 2023) would be okay to hold Plavix for procedures or surgeries-5 to 7 days preop. ?

## 2021-07-29 NOTE — Assessment & Plan Note (Signed)
Chronic HFpEF with at least grade 2 diastolic dysfunction and on echo and elevated pressures noted in the Cath Lab.  This is despite having relatively low blood pressures. ? ?He is on Toprol and we are reducing his dose of diltiazem, potentially we could add some afterload reduction by further weaning off diltiazem intensity. ?He is also on [place] for combined diabetes and CHF effect. ? ?His weight is up today which is unusual, so we we discussed the importance of sliding scale Lasix.  He is taking his daily Lasix, and did note some improvement with diuresis after increasing his dose a couple days.  I explained that this is the whole reason for having sliding scale.  He is scheduled to take an additional dose on Monday Wednesday Friday but it is okay to take additional doses if necessary on either scheduled or nonscheduled days for weight gain or worsening dyspnea/edema.Marland Kitchen ?

## 2021-07-29 NOTE — Assessment & Plan Note (Signed)
This history of atrial flutter with ablation years ago, has now had a history of A-fib with no obvious breakthrough spells.  As above, will try to titrate up his diltiazem dose for some antianginal benefit as well as knocking down PACs.  Unfortunately, he is noticing progressive fatigue, despite improved breathing from PCI. ? ?Plan: Convert back to 180 mg daily diltiazem along with Toprol. ?Remains on Eliquis for DOAC along with Plavix.  Not on aspirin. ?

## 2021-07-29 NOTE — Assessment & Plan Note (Signed)
Followed by pulmonary medicine.  I do suspect that some of his dyspnea is stress/panic attack related, but still has profound dyspnea just at rest which cannot be explained by his cath findings.  We will continue to treat his diastolic heart failure and are reevaluating his valve. ?

## 2021-07-29 NOTE — Assessment & Plan Note (Addendum)
Stable on Eliquis. ->  With no recurrent symptoms of A-fib that I can tell, okay to hold Eliquis 2 to 3 days preop for surgical procedures. ? ?This patients CHA2DS2-VASc Score and unadjusted Ischemic Stroke Rate (% per year) is equal to 7.2 % stroke rate/year from a score of 5 ? ?Above score calculated as 1 point each if present [CHF, HTN, DM, Vascular=MI/PAD/Aortic Plaque, Age if 77-74, or Male] ?Above score calculated as 2 points each if present [Age > 75, or Stroke/TIA/TE] ? ? ?

## 2021-07-29 NOTE — Assessment & Plan Note (Addendum)
Following PCI, he is angina symptoms seem to have improved as is his dyspnea, but still has fatigue with some exertional dyspnea.  I think there is a definite diastolic heart failure component as well as underlying lung disease. ? ?? He is currently on beta-blocker and calcium channel blocker for rate and anginal control.  Reducing diltiazem dose to 180 mg today. ?? He is on [place] and statin as well as daily dose of Lasix-with intermittent increased doses and sliding scale.. ?? On Plavix plus Eliquis with no major bleeding issues. ?

## 2021-07-29 NOTE — Assessment & Plan Note (Signed)
Notable improvement in his chest tightness and exertional dyspnea symptoms following PTCA, but just now notices low energy.  I asked on several occasions if the chest tightness and pressure and dyspnea was improved.  He did say yes, but he still has some HFpEF symptoms of at least class II.  I think a lot of the dyspnea symptoms he is having is at rest and is probably related to anxiety. ? ? ?Stressed the importance of treating both his pulmonary disease and his heart failure. ?

## 2021-07-29 NOTE — Assessment & Plan Note (Signed)
His blood pressure actually is pretty low in the Cath Lab which is another reason for reducing his diltiazem dose. ? ?Plan: Reduce diltiazem to 180 mg daily and continue current dose of Toprol. ?May need to consider afterload reduction if pressures go back up. ?p-> could conceivably fully DC diltiazem in lieu of adding ARB at follow-up visit pending echo results. ? ?Continue standing dose of Lasix and we discussed sliding scale. ?

## 2021-07-29 NOTE — Assessment & Plan Note (Signed)
PACs noted on monitor with history of PAF.  I tried to increase his diltiazem dose, but he has had more prominent fatigue.  As such, I think we can reduce it back down again.  Did not seem to help much in the way of symptoms. ? ?Plan: Reduce diltiazem to 180 mg daily. ?Continue current dose of beta-blocker with Toprol at 25 mg, but low threshold to consider switching to acebutolol if fatigue symptoms worsen. ?

## 2021-08-13 ENCOUNTER — Ambulatory Visit: Payer: Medicare Other | Admitting: Cardiology

## 2021-08-21 ENCOUNTER — Other Ambulatory Visit: Payer: Self-pay

## 2021-08-21 MED ORDER — ROSUVASTATIN CALCIUM 20 MG PO TABS: 20.0000 mg | ORAL_TABLET | Freq: Every day | ORAL | 3 refills | Status: DC

## 2021-09-29 ENCOUNTER — Other Ambulatory Visit: Payer: Self-pay | Admitting: Pulmonary Disease

## 2021-09-29 DIAGNOSIS — J849 Interstitial pulmonary disease, unspecified: Secondary | ICD-10-CM

## 2021-10-01 NOTE — Telephone Encounter (Signed)
*  on prednisone

## 2021-10-02 MED ORDER — PREDNISONE 5 MG PO TABS: ORAL_TABLET | ORAL | 1 refills | Status: DC

## 2021-10-16 ENCOUNTER — Encounter: Payer: Self-pay | Admitting: Pulmonary Disease

## 2021-10-16 ENCOUNTER — Ambulatory Visit (INDEPENDENT_AMBULATORY_CARE_PROVIDER_SITE_OTHER): Payer: Medicare Other | Admitting: Pulmonary Disease

## 2021-10-16 VITALS — BP 132/76 | HR 71 | Ht 75.0 in | Wt 213.2 lb

## 2021-10-16 DIAGNOSIS — Z9861 Coronary angioplasty status: Secondary | ICD-10-CM | POA: Diagnosis not present

## 2021-10-16 DIAGNOSIS — I2510 Atherosclerotic heart disease of native coronary artery without angina pectoris: Secondary | ICD-10-CM | POA: Diagnosis not present

## 2021-10-16 DIAGNOSIS — J849 Interstitial pulmonary disease, unspecified: Secondary | ICD-10-CM

## 2021-10-16 NOTE — Assessment & Plan Note (Signed)
Appears stable by history and exam.  Has been radiologically stable since 2019 We will continue to follow with annual CT and continue him on 5 mg of prednisone. Previous attempts to decrease and taper of prednisone have not been successful.

## 2021-10-16 NOTE — Patient Instructions (Signed)
   X HRCT chest in dec 2023  Continue 5 mg prednisone daily

## 2021-10-16 NOTE — Progress Notes (Signed)
   Subjective:    Patient ID: Kenneth [name], male    DOB: 06/02/1945, 76 y.o.   MRN: [id number]  HPI   76 yo man with  steroid responsive ILD , probable NSIP.  There is a history of being on methotrexate in the remote past. He  responded well to steroids and is maintained on low-dose about 5 mg of prednisone.  We  discussed immunosuppressants in the past and he declined Unfortunately he cannot perform PFTs   -retired special ops   Kenneth [name] - CAD, 10/2020  left heart cath s/p PCI to LAD , other stents were patent. -atrial fibrillation for 15 years s/p atrial ablation 01/2014 . He takes flecainide if his heart rate gets too high.  -mod AS For psoriatic arthritis, he was on Enbrel and methotrexate weekly for 3 years ,  then on Humira.  Now on adalimumab   -Wife has GBS and he is the caregiver  69-monthfollow-up visit. [name] weaker than his usual self, arrives ambulating with a cane.  He had a fall yesterday and bruised his right hip and shoulder.  He reports hematuria for 2 days without any flank pain, he has a history of renal calculi in the past.  He is on apixaban.  He has scheduled a visit with urology. Breathing is stable, he remains on 5 mg of prednisone  LSiseems to be having memory issues and is seeing a neurologist but he has vivid memories of his days in the special ops and tells me stories about his missions and his previous hand injury   Significant tests/ events reviewed   CT angiogram 09/2007 which shows bilateral groundglass opacities but a repeat CT in 10/2007 and shows resolution of these infiltrates.  01/27/2014 HRCT - patchy areas of very mild ground-glass attenuation and subpleural reticulation.  CHest HRCT 04/2016 >> slight worse , NSIP pattern HRCT 05/2017 unchanged NSIP, 2-4 mm new  benign nodules   HRCT 10/2019 >> likely NSIP, unchanged from 2019   HRCT 03/2021 >> stable ILD , stable 4.1 cm asc TAA     PFT 04/2015 >> ratio nml, FVC 61%, TLC 68%, DLCO 67%       LHC 10/2020 LAD stent was placed, other stents patent   09/2020 echo LVEF more than 35%, hyperdynamic, moderate aortic stenosis, dilated LA, normal RVSP     Review of Systems neg for any significant sore throat, dysphagia, itching, sneezing, nasal congestion or excess/ purulent secretions, fever, chills, sweats, unintended wt loss, pleuritic or exertional cp, hempoptysis, orthopnea pnd or change in chronic leg swelling. Also denies presyncope, palpitations, heartburn, abdominal pain, nausea, vomiting, diarrhea or change in bowel or urinary habits, dysuria,hematuria, rash, arthralgias, visual complaints, headache, numbness weakness or ataxia.     Objective:   Physical Exam   Gen. Pleasant, obese, in no distress ENT - no lesions, no post nasal drip Neck: No JVD, no thyromegaly, no carotid bruits Lungs: no use of accessory muscles, no dullness to percussion, bibasal fine dry rales no rhonchi  Cardiovascular: Rhythm regular, heart sounds  normal, no murmurs or gallops, no peripheral edema Musculoskeletal: No deformities, no cyanosis or clubbing , no tremors        Assessment & Plan:

## 2021-10-18 DIAGNOSIS — M25551 Pain in right hip: Secondary | ICD-10-CM | POA: Diagnosis not present

## 2021-10-18 DIAGNOSIS — S7011XA Contusion of right thigh, initial encounter: Secondary | ICD-10-CM | POA: Diagnosis not present

## 2021-10-18 DIAGNOSIS — S7001XA Contusion of right hip, initial encounter: Secondary | ICD-10-CM | POA: Diagnosis not present

## 2021-10-19 ENCOUNTER — Encounter (HOSPITAL_COMMUNITY): Payer: Self-pay | Admitting: Urology

## 2021-10-19 ENCOUNTER — Telehealth: Payer: Self-pay | Admitting: *Deleted

## 2021-10-19 ENCOUNTER — Other Ambulatory Visit: Payer: Self-pay | Admitting: Urology

## 2021-10-19 ENCOUNTER — Other Ambulatory Visit: Payer: Self-pay

## 2021-10-19 ENCOUNTER — Telehealth: Payer: Self-pay | Admitting: Cardiology

## 2021-10-19 DIAGNOSIS — M160 Bilateral primary osteoarthritis of hip: Secondary | ICD-10-CM | POA: Diagnosis not present

## 2021-10-19 DIAGNOSIS — K5730 Diverticulosis of large intestine without perforation or abscess without bleeding: Secondary | ICD-10-CM | POA: Diagnosis not present

## 2021-10-19 DIAGNOSIS — K449 Diaphragmatic hernia without obstruction or gangrene: Secondary | ICD-10-CM | POA: Diagnosis not present

## 2021-10-19 DIAGNOSIS — N132 Hydronephrosis with renal and ureteral calculous obstruction: Secondary | ICD-10-CM | POA: Diagnosis not present

## 2021-10-19 DIAGNOSIS — R310 Gross hematuria: Secondary | ICD-10-CM | POA: Diagnosis not present

## 2021-10-19 NOTE — Telephone Encounter (Signed)
   Pre-operative Risk Assessment    Patient Name: Kenneth [name]  DOB: 1945/04/23 MRN: [id number]      Request for Surgical Clearance    Procedure:    Left urethra ureteroscopy and stent placement  Date of Surgery:  Clearance 10/22/21                                 Surgeon:     Dr. Irine Seal Surgeon's Group or Practice Name:  [facility] Phone number:  [phone] Fax number:  [phone]   Type of Clearance Requested:   - Medical    Type of Anesthesia:  General    Additional requests/questions:    Caller wanted to have Eliquis held   Lenoard Aden   10/19/2021, 3:23 PM

## 2021-10-19 NOTE — Telephone Encounter (Signed)
I s/w the pt and his wife and they are agreeable to plan of care for a tele visit Monday 10/22/21. I had to double book the schedule as the pt was scheduled for procedure 10/22/21. Our office received the clearance request 3:23 pm 10/19/21 Friday, for procedure Monday. Procedure will need to be post poned until pt has been cleared by cardiology. Med rec and consent are done. Pt's wife tells me that Dr. Jeffie Pollock has instructed the pt to hold his Eliquis and Plavix as of Sat 7/15, Sun7/16, Monday 10/22/21. Last doses of blood thinners will be tonight per the pt 's wife.       Patient Consent for Virtual Visit        TARAS RASK has provided verbal consent on 10/19/2021 for a virtual visit (video or telephone).   CONSENT FOR VIRTUAL VISIT FOR:  [name]  By participating in this virtual visit I agree to the following:  I hereby voluntarily request, consent and authorize [facility] and its employed or contracted physicians, physician assistants, nurse practitioners or other licensed health care professionals (the Practitioner), to provide me with telemedicine health care services (the "Services") as deemed necessary by the treating Practitioner. I acknowledge and consent to receive the Services by the Practitioner via telemedicine. I understand that the telemedicine visit will involve communicating with the Practitioner through live audiovisual communication technology and the disclosure of certain medical information by electronic transmission. I acknowledge that I have been given the opportunity to request an in-person assessment or other available alternative prior to the telemedicine visit and am voluntarily participating in the telemedicine visit.  I understand that I have the right to withhold or withdraw my consent to the use of telemedicine in the course of my care at any time, without affecting my right to future care or treatment, and that the Practitioner or I may terminate the  telemedicine visit at any time. I understand that I have the right to inspect all information obtained and/or recorded in the course of the telemedicine visit and may receive copies of available information for a reasonable fee.  I understand that some of the potential risks of receiving the Services via telemedicine include:  Delay or interruption in medical evaluation due to technological equipment failure or disruption; Information transmitted may not be sufficient (e.g. poor resolution of images) to allow for appropriate medical decision making by the Practitioner; and/or  In rare instances, security protocols could fail, causing a breach of personal health information.  Furthermore, I acknowledge that it is my responsibility to provide information about my medical history, conditions and care that is complete and accurate to the best of my ability. I acknowledge that Practitioner's advice, recommendations, and/or decision may be based on factors not within their control, such as incomplete or inaccurate data provided by me or distortions of diagnostic images or specimens that may result from electronic transmissions. I understand that the practice of medicine is not an exact science and that Practitioner makes no warranties or guarantees regarding treatment outcomes. I acknowledge that a copy of this consent can be made available to me via my patient portal ([facility]), or I can request a printed copy by calling the office of [place].    I understand that my insurance will be billed for this visit.   I have read or had this consent read to me. I understand the contents of this consent, which adequately explains the benefits and risks of the Services  being provided via telemedicine.  I have been provided ample opportunity to ask questions regarding this consent and the Services and have had my questions answered to my satisfaction. I give my informed consent for the services to be  provided through the use of telemedicine in my medical care

## 2021-10-19 NOTE — Progress Notes (Addendum)
For Short Stay: [place] appointment date: N/A Date of COVID positive in last 90 days: N/A  Bowel Prep reminder:N/A   For Anesthesia: PCP - Landry Mellow, MD [facility] Cardiologist - Leonie Man, MD Pulmonologist-Alva, Leanna Sato, MD  Chest x-ray - 02/05/21 in epic EKG - 07/25/21 in epic Stress Test - 02/11/21 in epic ECHO - 04/12/21 in epic Cardiac Cath - 07/04/21 in epic Pacemaker/ICD device last checked: N/A Pacemaker orders received:N/A Device Rep notified:N/A  Spinal Cord Stimulator: N/A  Sleep Study - Yes CPAP - No  Fasting Blood Sugar - N/A Checks Blood Sugar ___N/A__ times a day Date and result of last Hgb A1c-N/A  Blood Thinner Instructions: Eliquis and Plavix Aspirin Instructions: N/A Last Dose: 10/19/2021  Activity level: Can go up a flight of stairs and activities of daily living without stopping and without chest pain and/or shortness of breath      Anesthesia review: PAF, HTN, CAD, ILD, DM, CHF  Patient denies shortness of breath, fever, cough and chest pain at PAT appointment   Patient verbalized understanding of instructions that were given to them at the PAT appointment. Patient was also instructed that they will need to review over the PAT instructions again at home before surgery.

## 2021-10-19 NOTE — Telephone Encounter (Signed)
I s/w the Kenneth [name] and his wife and they are agreeable to plan of care for a tele visit Monday 10/22/21. I had to double book the schedule as the Kenneth [name] was scheduled for procedure 10/22/21. Our office received the clearance request 3:23 pm 10/19/21 Friday, for procedure Monday. Procedure will need to be post poned until Kenneth [name] has been cleared by cardiology. Med rec and consent are done.

## 2021-10-19 NOTE — Telephone Encounter (Signed)
   Name: Kenneth [name]  DOB: 1945-04-13  MRN: [id number]  Primary Cardiologist: [name], MD  Chart reviewed as part of pre-operative protocol coverage. Because of [name]'s past medical history and time since last visit, he will require a follow-up tele visit in order to better assess preoperative cardiovascular risk.  Pre-op covering staff: - Please schedule appointment and call patient to inform them. If patient already had an upcoming appointment within acceptable timeframe, please add "pre-op clearance" to the appointment notes so provider is aware. - Please contact requesting surgeon's office via preferred method (i.e, phone, fax) to inform them of need for appointment prior to surgery.  No medications indicated needing held.   [name], PA-C  10/19/2021, 5:23 PM

## 2021-10-22 ENCOUNTER — Ambulatory Visit (HOSPITAL_COMMUNITY): Payer: Medicare Other | Admitting: Physician Assistant

## 2021-10-22 ENCOUNTER — Ambulatory Visit (HOSPITAL_BASED_OUTPATIENT_CLINIC_OR_DEPARTMENT_OTHER): Payer: Medicare Other | Admitting: Physician Assistant

## 2021-10-22 ENCOUNTER — Ambulatory Visit (HOSPITAL_COMMUNITY)
Admission: RE | Admit: 2021-10-22 | Discharge: 2021-10-22 | Disposition: A | Discharge status: Home / Self Care | Payer: Medicare Other | Attending: Urology | Admitting: Urology

## 2021-10-22 ENCOUNTER — Encounter (HOSPITAL_COMMUNITY)
Admission: RE | Disposition: A | Discharge status: Home / Self Care | Payer: Self-pay | Source: Home / Self Care | Attending: Urology

## 2021-10-22 ENCOUNTER — Ambulatory Visit (INDEPENDENT_AMBULATORY_CARE_PROVIDER_SITE_OTHER): Payer: Medicare Other | Admitting: Physician Assistant

## 2021-10-22 ENCOUNTER — Encounter (HOSPITAL_COMMUNITY): Payer: Self-pay | Admitting: Urology

## 2021-10-22 ENCOUNTER — Ambulatory Visit (HOSPITAL_COMMUNITY): Payer: Medicare Other

## 2021-10-22 DIAGNOSIS — I509 Heart failure, unspecified: Secondary | ICD-10-CM | POA: Diagnosis not present

## 2021-10-22 DIAGNOSIS — Z87442 Personal history of urinary calculi: Secondary | ICD-10-CM | POA: Diagnosis not present

## 2021-10-22 DIAGNOSIS — E119 Type 2 diabetes mellitus without complications: Secondary | ICD-10-CM | POA: Insufficient documentation

## 2021-10-22 DIAGNOSIS — Z01810 Encounter for preprocedural cardiovascular examination: Secondary | ICD-10-CM

## 2021-10-22 DIAGNOSIS — G4733 Obstructive sleep apnea (adult) (pediatric): Secondary | ICD-10-CM | POA: Diagnosis not present

## 2021-10-22 DIAGNOSIS — K219 Gastro-esophageal reflux disease without esophagitis: Secondary | ICD-10-CM | POA: Insufficient documentation

## 2021-10-22 DIAGNOSIS — Z9989 Dependence on other enabling machines and devices: Secondary | ICD-10-CM | POA: Diagnosis not present

## 2021-10-22 DIAGNOSIS — I4891 Unspecified atrial fibrillation: Secondary | ICD-10-CM | POA: Diagnosis not present

## 2021-10-22 DIAGNOSIS — I110 Hypertensive heart disease with heart failure: Secondary | ICD-10-CM | POA: Insufficient documentation

## 2021-10-22 DIAGNOSIS — N5201 Erectile dysfunction due to arterial insufficiency: Secondary | ICD-10-CM | POA: Insufficient documentation

## 2021-10-22 DIAGNOSIS — R310 Gross hematuria: Secondary | ICD-10-CM | POA: Diagnosis not present

## 2021-10-22 DIAGNOSIS — Z7984 Long term (current) use of oral hypoglycemic drugs: Secondary | ICD-10-CM | POA: Insufficient documentation

## 2021-10-22 DIAGNOSIS — Z955 Presence of coronary angioplasty implant and graft: Secondary | ICD-10-CM | POA: Diagnosis not present

## 2021-10-22 DIAGNOSIS — G4730 Sleep apnea, unspecified: Secondary | ICD-10-CM | POA: Diagnosis not present

## 2021-10-22 DIAGNOSIS — N201 Calculus of ureter: Secondary | ICD-10-CM | POA: Diagnosis not present

## 2021-10-22 DIAGNOSIS — Z7901 Long term (current) use of anticoagulants: Secondary | ICD-10-CM | POA: Diagnosis not present

## 2021-10-22 DIAGNOSIS — Z7902 Long term (current) use of antithrombotics/antiplatelets: Secondary | ICD-10-CM | POA: Insufficient documentation

## 2021-10-22 DIAGNOSIS — Z87891 Personal history of nicotine dependence: Secondary | ICD-10-CM | POA: Insufficient documentation

## 2021-10-22 DIAGNOSIS — I2510 Atherosclerotic heart disease of native coronary artery without angina pectoris: Secondary | ICD-10-CM | POA: Diagnosis not present

## 2021-10-22 DIAGNOSIS — Z01818 Encounter for other preprocedural examination: Secondary | ICD-10-CM | POA: Diagnosis not present

## 2021-10-22 HISTORY — DX: Other amnesia: R41.3

## 2021-10-22 HISTORY — DX: Other specified postprocedural states: Z98.890

## 2021-10-22 HISTORY — DX: Nausea with vomiting, unspecified: R11.2

## 2021-10-22 HISTORY — PX: CYSTOSCOPY WITH RETROGRADE PYELOGRAM, URETEROSCOPY AND STENT PLACEMENT: SHX5789

## 2021-10-22 LAB — BASIC METABOLIC PANEL
Anion gap: 7 (ref 5–15)
BUN: 9 mg/dL (ref 8–23)
CO2: 22 mmol/L (ref 22–32)
Calcium: 8.8 mg/dL — ABNORMAL LOW (ref 8.9–10.3)
Chloride: 108 mmol/L (ref 98–111)
Creatinine, Ser: 0.93 mg/dL (ref 0.61–1.24)
GFR, Estimated: >60 mL/min (ref >60)
Glucose, Bld: 140 mg/dL — ABNORMAL HIGH (ref 70–99)
Potassium: 3.8 mmol/L (ref 3.5–5.1)
Sodium: 137 mmol/L (ref 135–145)

## 2021-10-22 LAB — CBC
HCT: 35.4 % — ABNORMAL LOW (ref 39.0–52.0)
Hemoglobin: 11.5 g/dL — ABNORMAL LOW (ref 13.0–17.0)
MCH: 28.3 pg (ref 26.0–34.0)
MCHC: 32.5 g/dL (ref 30.0–36.0)
MCV: 87 fL (ref 80.0–100.0)
Platelets: 200 10*3/uL (ref 150–400)
RBC: 4.07 MIL/uL — ABNORMAL LOW (ref 4.22–5.81)
RDW: 17.2 % — ABNORMAL HIGH (ref 11.5–15.5)
WBC: 6.9 10*3/uL (ref 4.0–10.5)
nRBC: 0 % (ref 0.0–0.2)

## 2021-10-22 LAB — HEMOGLOBIN A1C
Hgb A1c MFr Bld: 6.3 % — ABNORMAL HIGH (ref 4.8–5.6)
Mean Plasma Glucose: 134.11 mg/dL

## 2021-10-22 LAB — GLUCOSE, CAPILLARY: Glucose-Capillary: 142 mg/dL — ABNORMAL HIGH (ref 70–99)

## 2021-10-22 SURGERY — CYSTOURETEROSCOPY, WITH RETROGRADE PYELOGRAM AND STENT INSERTION
Anesthesia: General | Laterality: Left

## 2021-10-22 MED ORDER — MEPERIDINE HCL 50 MG/ML IJ SOLN: 6.2500 mg | INTRAMUSCULAR | Status: DC | PRN

## 2021-10-22 MED ORDER — ONDANSETRON HCL 4 MG/2ML IJ SOLN
INTRAMUSCULAR | Status: AC
  Filled 2021-10-22: qty 2

## 2021-10-22 MED ORDER — PHENYLEPHRINE 80 MCG/ML (10ML) SYRINGE FOR IV PUSH (FOR BLOOD PRESSURE SUPPORT)
PREFILLED_SYRINGE | INTRAVENOUS | Status: DC | PRN
  Administered 2021-10-22 (×2): 80 ug via INTRAVENOUS

## 2021-10-22 MED ORDER — IOHEXOL 300 MG/ML  SOLN
INTRAMUSCULAR | Status: DC | PRN
  Administered 2021-10-22: 10 mL

## 2021-10-22 MED ORDER — OXYCODONE HCL 5 MG PO TABS: 5.0000 mg | ORAL_TABLET | Freq: Once | ORAL | Status: DC | PRN

## 2021-10-22 MED ORDER — ORAL CARE MOUTH RINSE: 15.0000 mL | Freq: Once | OROMUCOSAL | Status: AC

## 2021-10-22 MED ORDER — ONDANSETRON HCL 4 MG/2ML IJ SOLN
INTRAMUSCULAR | Status: DC | PRN
  Administered 2021-10-22: 4 mg via INTRAVENOUS

## 2021-10-22 MED ORDER — LACTATED RINGERS IV SOLN: INTRAVENOUS | Status: DC

## 2021-10-22 MED ORDER — DEXAMETHASONE SODIUM PHOSPHATE 10 MG/ML IJ SOLN
INTRAMUSCULAR | Status: DC | PRN
  Administered 2021-10-22: 8 mg via INTRAVENOUS

## 2021-10-22 MED ORDER — SODIUM CHLORIDE 0.9 % IR SOLN
Status: DC | PRN
  Administered 2021-10-22: 3000 mL via INTRAVESICAL

## 2021-10-22 MED ORDER — ACETAMINOPHEN 325 MG PO TABS: 325.0000 mg | ORAL_TABLET | ORAL | Status: DC | PRN

## 2021-10-22 MED ORDER — CEFAZOLIN SODIUM-DEXTROSE 2-4 GM/100ML-% IV SOLN
2.0000 g | INTRAVENOUS | Status: AC
  Administered 2021-10-22: 2 g via INTRAVENOUS
  Filled 2021-10-22: qty 100

## 2021-10-22 MED ORDER — FENTANYL CITRATE (PF) 100 MCG/2ML IJ SOLN
INTRAMUSCULAR | Status: DC | PRN
  Administered 2021-10-22: 100 ug via INTRAVENOUS

## 2021-10-22 MED ORDER — PROPOFOL 10 MG/ML IV BOLUS
INTRAVENOUS | Status: DC | PRN
  Administered 2021-10-22: 110 mg via INTRAVENOUS

## 2021-10-22 MED ORDER — PROPOFOL 10 MG/ML IV BOLUS
INTRAVENOUS | Status: AC
  Filled 2021-10-22: qty 20

## 2021-10-22 MED ORDER — ACETAMINOPHEN 500 MG PO TABS
1000.0000 mg | ORAL_TABLET | Freq: Once | ORAL | Status: AC
  Administered 2021-10-22: 1000 mg via ORAL

## 2021-10-22 MED ORDER — FENTANYL CITRATE PF 50 MCG/ML IJ SOSY: 25.0000 ug | PREFILLED_SYRINGE | INTRAMUSCULAR | Status: DC | PRN

## 2021-10-22 MED ORDER — 0.9 % SODIUM CHLORIDE (POUR BTL) OPTIME
TOPICAL | Status: DC | PRN
  Administered 2021-10-22: 1000 mL

## 2021-10-22 MED ORDER — CELECOXIB 200 MG PO CAPS
200.0000 mg | ORAL_CAPSULE | Freq: Once | ORAL | Status: AC
  Administered 2021-10-22: 200 mg via ORAL

## 2021-10-22 MED ORDER — DIPHENHYDRAMINE HCL 50 MG/ML IJ SOLN
INTRAMUSCULAR | Status: DC | PRN
  Administered 2021-10-22: 12.5 mg via INTRAVENOUS

## 2021-10-22 MED ORDER — PHENYLEPHRINE HCL (PRESSORS) 10 MG/ML IV SOLN
INTRAVENOUS | Status: AC
  Filled 2021-10-22: qty 1

## 2021-10-22 MED ORDER — PHENYLEPHRINE HCL-NACL 20-0.9 MG/250ML-% IV SOLN
INTRAVENOUS | Status: DC | PRN
  Administered 2021-10-22: 25 ug/min via INTRAVENOUS

## 2021-10-22 MED ORDER — OXYCODONE HCL 5 MG/5ML PO SOLN: 5.0000 mg | Freq: Once | ORAL | Status: DC | PRN

## 2021-10-22 MED ORDER — ACETAMINOPHEN 160 MG/5ML PO SOLN: 325.0000 mg | ORAL | Status: DC | PRN

## 2021-10-22 MED ORDER — DIPHENHYDRAMINE HCL 50 MG/ML IJ SOLN
INTRAMUSCULAR | Status: AC
  Filled 2021-10-22: qty 1

## 2021-10-22 MED ORDER — FENTANYL CITRATE (PF) 100 MCG/2ML IJ SOLN
INTRAMUSCULAR | Status: AC
  Filled 2021-10-22: qty 2

## 2021-10-22 MED ORDER — LIDOCAINE HCL (CARDIAC) PF 100 MG/5ML IV SOSY
PREFILLED_SYRINGE | INTRAVENOUS | Status: DC | PRN
  Administered 2021-10-22: 100 mg via INTRAVENOUS

## 2021-10-22 MED ORDER — CELECOXIB 200 MG PO CAPS
ORAL_CAPSULE | ORAL | Status: AC
  Filled 2021-10-22: qty 1

## 2021-10-22 MED ORDER — LIDOCAINE HCL (PF) 2 % IJ SOLN
INTRAMUSCULAR | Status: AC
Start: 2021-10-22 — End: ?
  Filled 2021-10-22: qty 5

## 2021-10-22 MED ORDER — CHLORHEXIDINE GLUCONATE 0.12 % MT SOLN
15.0000 mL | Freq: Once | OROMUCOSAL | Status: AC
  Administered 2021-10-22: 15 mL via OROMUCOSAL

## 2021-10-22 MED ORDER — ONDANSETRON HCL 4 MG/2ML IJ SOLN: 4.0000 mg | Freq: Once | INTRAMUSCULAR | Status: DC | PRN

## 2021-10-22 MED ORDER — SODIUM CHLORIDE 0.9% FLUSH: 3.0000 mL | Freq: Two times a day (BID) | INTRAVENOUS | Status: DC

## 2021-10-22 MED ORDER — DEXAMETHASONE SODIUM PHOSPHATE 10 MG/ML IJ SOLN
INTRAMUSCULAR | Status: AC
  Filled 2021-10-22: qty 1

## 2021-10-22 MED ORDER — ACETAMINOPHEN 500 MG PO TABS
ORAL_TABLET | ORAL | Status: AC
  Filled 2021-10-22: qty 2

## 2021-10-22 SURGICAL SUPPLY — 24 items
BAG URO CATCHER STRL LF (MISCELLANEOUS) ×3 IMPLANT
BASKET STONE NCOMPASS (UROLOGICAL SUPPLIES) IMPLANT
CATH URETERAL DUAL LUMEN 10F (MISCELLANEOUS) IMPLANT
CATH URETL OPEN 5X70 (CATHETERS) IMPLANT
CLOTH BEACON ORANGE TIMEOUT ST (SAFETY) ×3 IMPLANT
EXTRACTOR STONE 1.7FRX115CM (UROLOGICAL SUPPLIES) ×1 IMPLANT
EXTRACTOR STONE NITINOL NGAGE (UROLOGICAL SUPPLIES) IMPLANT
GLOVE SURG SS PI 8.0 STRL IVOR (GLOVE) ×3 IMPLANT
GOWN STRL REUS W/ TWL XL LVL3 (GOWN DISPOSABLE) ×2 IMPLANT
GOWN STRL REUS W/TWL XL LVL3 (GOWN DISPOSABLE) ×2
GUIDEWIRE STR DUAL SENSOR (WIRE) ×3 IMPLANT
IV NS IRRIG 3000ML ARTHROMATIC (IV SOLUTION) ×3 IMPLANT
KIT TURNOVER KIT A (KITS) IMPLANT
LASER FIB FLEXIVA PULSE ID 365 (Laser) IMPLANT
LASER FIB FLEXIVA PULSE ID 550 (Laser) IMPLANT
LASER FIB FLEXIVA PULSE ID 910 (Laser) IMPLANT
MANIFOLD NEPTUNE II (INSTRUMENTS) ×3 IMPLANT
PACK CYSTO (CUSTOM PROCEDURE TRAY) ×3 IMPLANT
SHEATH NAVIGATOR HD 11/13X36 (SHEATH) ×1 IMPLANT
STENT CONTOUR 6FRX26X.038 (STENTS) ×1 IMPLANT
TRACTIP FLEXIVA PULS ID 200XHI (Laser) IMPLANT
TRACTIP FLEXIVA PULSE ID 200 (Laser) ×1 IMPLANT
TUBING CONNECTING 10 (TUBING) ×3 IMPLANT
TUBING UROLOGY SET (TUBING) ×3 IMPLANT

## 2021-10-22 NOTE — Telephone Encounter (Signed)
I will forward this to the pre op provider to please see notes from Dr. Jeffie Pollock. We had not other time that we could schedule the pt for pre op clearance and the pt has been double booked on the pre op schedule today. See notes from Friday 10/19/21

## 2021-10-22 NOTE — Op Note (Signed)
Procedure: 1.  Cystoscopy with left retrograde pyelogram and interpretation. 2.  Left ureteroscopy with holmium laser trips to the, stone extraction and insertion of double-J stent. 3.  Application of fluoroscopy.  Preop diagnosis: Left mid and proximal ureteral stones.  Postop diagnosis: Same.  Surgeon: Dr. Irine Seal.  Anesthesia: General.  Specimen: Stone fragments.  Drain: 6 [place] by 26 cm right contour double-J stent without tether.  EBL: None.  Complications: None.  Indications: Kenneth [name] is a 76 year old male with a history recurrent urolithiasis who presented to the office last week with severe left flank pain.  He was found to have an 8 mm left proximal stone and 2 to 3 mm left mid ureteral stone with obstruction.  He is elected ureteroscopy.  Procedure: He was taken operating room was given Ancef.  A general anesthetic was induced.  He was placed in lithotomy position and fitted with PAS hose.  His perineum and genitalia were prepped with Betadine solution he was draped in usual sterile fashion.  Cystoscopy was performed and the 23 [place] scope and 30 degree lens.  Examination revealed a normal urethra.  The external sphincter was intact.  The prostatic urethra was approximately 2 to 3 cm in length with lateral lobe hyperplasia with some coaptation.  Examination of bladder revealed mild trabeculation without mucosal lesions.  The urine was actually bloody and the bladder had to be irrigated out to visualize the bladder wall.  The ureteral orifices were unremarkable.  There was clear reflux from the right ureteral orifice.  The left ureteral orifice was cannulated with a 5 [place] open-ended catheter and Omnipaque was instilled.  The left retrograde pyelogram revealed a normal caliber distal ureter there was a narrowed area in the mid ureter near the iliacs with a small filling defect consistent with a stone and proximal dilation above this.  There is no other filling defect in the  proximal ureter consistent with a stone the hydronephrosis was mild.  A sensor wire was then advanced the kidney through the open-ended catheter and the cystoscope was removed.  An 59 French 36 cm digital access sheath inner core was then passed over the wire to the proximal ureter.  The inner core was removed and the single-lumen short semirigid ureteroscope was then passed alongside the wire.  The lower stone was visualized and grasped with a engage basket and removed intact.  I was unable to get to the proximal stone with the ureteroscope.  I then placed the assembled 11/13 French 36 cm sheath to the proximal ureter below the stone and remove the inner core and wire.  The dual-lumen digital ureteroscope was then advanced through the sheath into the ureter and the stone was readily visualized.  The stone appeared quite fragile and was then treated with the 200 m laser fiber with the holmium set on 0.2 J and 60 Hz on the left pedal and 0.8 J and 10 Hz on the right pedal.  The stone readily fragmented.  The fragments were then retrieved using an engage basket.  The urine in the kidney was bloody making visualization difficult.  I did not see any large residual fragments but there was some grit and fluoroscopy revealed no radiopaque stones.  But with the bleeding it was felt that stenting was indicated.  The ureteroscope was removed and the sensor wire was then advanced the kidney under fluoroscopic guidance.  The sheath was removed and the cystoscope was reinserted over the wire.  A 6 [place] by 26  cm contour double-J stent without tether was passed to the kidney under fluoroscopic guidance.  The wire was removed, leaving a good coil in the kidney and a good coil in the bladder.  This was confirmed with fluoroscopy.  The bladder was then drained and the cystoscope was removed.  He was taken down from lithotomy position, his anesthetic was reversed and he was moved recovery in stable condition.  There were no  complications.  The stone fragments were given to the family.

## 2021-10-22 NOTE — Anesthesia Procedure Notes (Signed)
Procedure Name: LMA Insertion Date/Time: 10/22/2021 5:59 PM  Performed by: Raenette Rover, CRNAPre-anesthesia Checklist: Patient identified, Emergency Drugs available, Suction available and Patient being monitored Patient Re-evaluated:Patient Re-evaluated prior to induction Oxygen Delivery Method: Circle system utilized Preoxygenation: Pre-oxygenation with 100% oxygen Induction Type: IV induction LMA: LMA inserted LMA Size: 4.0 Number of attempts: 1 Placement Confirmation: positive ETCO2 and breath sounds checked- equal and bilateral Tube secured with: Tape Dental Injury: Teeth and Oropharynx as per pre-operative assessment

## 2021-10-22 NOTE — Progress Notes (Signed)
Patient is double booked for urgent telephone surgical clearance for Left urethra ureteroscopy and stent placement.  This procedure is scheduled today.  I tried to reach patient multiple times but no one has picked up a phone.  Seems like patient has surgical center currently.  My staff also tried to reach patient but unsuccessful.  Please reach out to [place] if needed further clearance.

## 2021-10-22 NOTE — H&P (Signed)
I have ureteral stone.  HPI: Kenneth [name] is a 76 year-old male established patient who is here for ureteral stone.     03/30/21: Kenneth [name] was recently in the [facility] ER for left flank pain and hematuria. He had a CT and had a 4.39m stone at the left UPJ. He is holding the Eliquis but remains on Plavix. He has no further gross hematuria but he still has some intermittent pain. He has some pain with breathing. He has a history of lung and cardiac issues and is SOB and has low energy. His UA today has 3-10 RBC's.   04/17/21: Kenneth [name] today in f/u. He last had pain on Sunday and was associated with urgency and frequency. By Monday morning those symptoms have resolved. He is doing well now and his UA is clear. He has been started on a diuretic for CHF and is doing better with the SOB and energy.    10/19/21: Kenneth [name] today with a 9 day history of intermittent gross hematuria and some lower abdominal pain.     ALLERGIES: Decongestant TABS - Other Reaction, Tachycardia    MEDICATIONS: Hydrocodone-Acetaminophen 5 mg-325 mg tablet 1-2 tablet PO Q 6 H prn  Metformin Hcl 500 mg tablet 1 tablet PO Daily  Metoprolol Tartrate 25 mg tablet  Tamsulosin Hcl 0.4 mg capsule 1 capsule PO Daily  Acetaminophen 500 mg tablet 1 tablet PO Daily  Acitretin 10 mg capsule  Biotin 1,000 mcg tablet,chewable 1 tablet PO Daily  Calcipotriene 0.005 % ointment 1 PO Daily  Cetirizine Hcl 10 mg tablet 1 tablet PO Daily  Clobetasol Propionate 0.05 % cream 1 PO Daily  Diltiazem 24Hr Er (Cd) 180 mg capsule, ext release 24 hr 1 capsule PO Daily  Eliquis 5 mg tablet 1 tablet PO BID  Ferrous Sulfate 325 mg (65 mg iron) tablet 1 tablet PO Daily  Furosemide 20 mg tablet  Gabapentin 300 mg capsule 1 capsule PO Daily  Nitroglycerin  Prazosin Hcl  Prednisone 5 mg tablet 1 tablet PO Daily  Rosuvastatin Calcium 20 mg tablet 1 tablet PO Daily  Sildenafil Citrate 100 mg tablet 1 tablet PO Daily  Sildenafil Citrate 100 mg  tablet 1 tablet PO as needed  Zolpidem Tartrate 10 mg tablet 1 tablet PO Daily     GU PSH: Cysto Uretero Lithotripsy, Right - 03/28/2020, 2009 Cystoscopy Insert Stent - 2009 ESWL, Left - 08/10/2020 Locm 300-'399Mg'$ /Ml Iodine,1Ml - 2019 Ureteroscopic laser litho, Right - 03/28/2020, Right - 2021, Right - 2019 Ureteroscopic stone removal, Right - 2019, 2009       [place]ar[place]Notes: Cardiac Catheter His Ablation     NON-GU PSH: Appendectomy - 2009 Cardiac Stent Placement - about 2018 Cataract surgery Sinus Surgery..     GU PMH: ED due to arterial insufficiency, sildenafill refilled. - 04/17/2021, sildenafil was dispensed. , - 02/24/2020, He will get '100mg'$  sildenafil. , - 2019, Erectile dysfunction due to arterial insufficiency, - 2014 Ureteral calculus, He is doing well with resolution of the pain and urgency. His UA is clear today. F/u in 6 months with a KUB. - 04/17/2021, He had a 4.58mstone at the left UVJ on 03/15/21 with hematuria. His pain currently doesn't seem consistent with stone pain and the KUB today doesn't show an obvious ureteral stone but it may now be in the LLP. I will have him resume the Eliquis and if he bleds again, I will have him get a CT to reassess the stone. He will otherwise return in 2-3  weeks for f/u. , - 03/30/2021, - 08/24/2020, - 08/16/2020 (Stable), - 03/15/2020 (Stable), - 2019, Calculus of distal right ureter, - 2014, Mid Ureteral Stone On The Left, - 2014, Distal Ureteral Stone On The Right, - 2014, Distal Ureteral Stone On The Right, - 2014, Calculus of ureter, - 2014 Microscopic hematuria (Stable), He has 3-10 RBC's on UA today. He will resume the Eliquis. - 03/30/2021, - 07/24/2020, I will have him return in 15month for reevaluation. , - 2019 Renal calculus - 10/06/2020, the LLP stone is now at the UPJ and measures 6x143m I have recommended he have ESWL for treatment. I reviewed the risks of ESWL including bleeding, infection, injury to the kidney or adjacent structures,  failure to fragment the stone, need for ancillary procedures, thrombotic events, cardiac arrhythmias and sedation complications. , - 07/24/2020, He has 2 small non-obstructing right renal stones. he has been rescheduled for f/u on 4/18. , - 05/31/2020, He has bilateral non-obstructing calculi in each kidney., - 03/15/2020, He has a renal stone but I don't see a ureteral stone but with the hematuria and pain, a small ureteral stone is possible. I have sent a script for hydromorphone and he will return in 2 weeks for reevaluation. If his symptoms persist, he will need a CT. , - 02/24/2020, He has bilateral renal stones that are non-obstructing. I will just have him return in 6 months with a KUB. , - 12/03/2019, - 2021, - 2021, - 2021, - 2021, - 2021, - 2020, He has possible stones on [place] the KUB is clear and his CT from 7/22 didn't show significant left renal stones. I don't think the flank pain is from a stone. , - 2019 (Stable), - 2019, Kidney stone on right side, - 2014 Renal and ureteral calculus - 04/03/2020 Gross hematuria - 03/15/2020, - 02/24/2020, He continues to have hemturia that is probably secondary to the right proximal ureteral stone and antiplatelet therapy. He will hold the plavix and continue aspirin until the bleeding stops. I will get him set up for cystoscopy with right ureteroscopy with stone extraction. I have reviewed the risks of ureteroscopy including bleeding, infection, ureteral injury, need for a stent or secondary procedures, thrombotic events and anesthetic complications. , - 2019 (Acute), Will proceed with hematuria workup with CT and cysto. Will check PSA today. Culture urine and send for cytology. Will empirically begin Cephalexin 500 mg 1 po BID X 7 days. , - 2019 Flank Pain - 02/24/2020, Generalized abdominal pain, - 2014 Ureteral obstruction secondary to calculous - 2021 BPH w/LUTS - 2021, - 2021, Benign prostatic hyperplasia with urinary obstruction, - 2014 Nocturia -  2021 Urinary Frequency - 2021 Dysuria - 2020 Acute Cystitis/UTI - 2019 History of urolithiasis, Nephrolithiasis - 2014 Other microscopic hematuria, Microscopic hematuria - 2014 Testicular atrophy, Testicular atrophy - 2014 Urinary Urgency, Urinary urgency - 2014      PMH Notes: heart murmur   Shingles   NON-GU PMH: Atherosclerosis of aorta, He has aortoiliac and coronary calcifications but is on metformin and rosuvastatin [facility] him know the findings on the most recent CT. - 12/03/2019 Asthma, Asthma - 2014 Cardiac murmur, unspecified, Murmurs - 2014 Unspecified atrial fibrillation, Atrial Fibrillation - 2014 Arrhythmia Arthritis Atrial Fibrillation Coronary Artery Disease Diabetes Type 2 Encounter for general adult medical examination without abnormal findings, Encounter for preventive health examination Hypercholesterolemia Hypertension    FAMILY HISTORY: Breast Cancer - Mother Colon Cancer - Mother Dementia -  Father, Mother Kidney Stones - Mother Prostate Cancer - Brother    Notes: Mother deceased at age 13 from dementia  Father deceased at 4 from dementia   SOCIAL HISTORY: Marital Status: Married Preferred Language: English; Ethnicity: Not Hispanic Or Latino; Race: White Current Smoking Status: Patient has never smoked.   Tobacco Use Assessment Completed: Used Tobacco in last 30 days? Does not use smokeless tobacco. Drinks 1 drink per year.  Does not use drugs. Drinks 1 caffeinated drink per day. Patient's occupation is/was Retired.    REVIEW OF SYSTEMS:    GU Review Male:   Patient denies penile pain, frequent urination, stream starts and stops, have to strain to urinate , get up at night to urinate, trouble starting your stream, hard to postpone urination, leakage of urine, erection problems, and burning/ pain with urination.  Gastrointestinal (Upper):   Patient denies nausea, vomiting, and indigestion/ heartburn.   Gastrointestinal (Lower):   Patient denies diarrhea and constipation.  Constitutional:   Patient denies fever, night sweats, weight loss, and fatigue.  Skin:   Patient denies skin rash/ lesion and itching.  Eyes:   Patient denies blurred vision and double vision.  Ears/ Nose/ Throat:   Patient denies sore throat and sinus problems.  Hematologic/Lymphatic:   Patient denies swollen glands and easy bruising.  Cardiovascular:   Patient denies leg swelling and chest pains.  Respiratory:   Patient denies cough and shortness of breath.  Endocrine:   Patient denies excessive thirst.  Musculoskeletal:   Patient reports back pain. Patient denies joint pain.  Neurological:   Patient denies headaches and dizziness.  Psychologic:   Patient denies depression and anxiety.   Notes: Lower abdominal pain    VITAL SIGNS:      10/19/2021 01:15 PM  Weight 212 lb / 96.16 kg  Height 75 in / 190.5 cm  BP 128/69 mmHg  Heart Rate 78 /min  Temperature 98.4 F / 36.8 C  BMI 26.5 kg/m   MULTI-SYSTEM PHYSICAL EXAMINATION:    Constitutional: Well-nourished. No physical deformities. Normally developed. Good grooming.   Respiratory: Normal breath sounds. No labored breathing, no use of accessory muscles.   Cardiovascular: Regular rate and rhythm. SEM murmur, no gallop. Normal temperature, normal extremity pulses, no swelling, no varicosities.      Complexity of Data:  Records Review:   AUA Symptom Score, Previous Patient Records  Urine Test Review:   Urinalysis  X-Ray Review: KUB: Reviewed Films. Discussed With Patient.  C.T. Stone Protocol: Reviewed Films. Discussed With Patient.     09/22/17 08/29/09  PSA  Total PSA 1.95 ng/mL 1.69     10/20/07  Hormones  Testosterone, Total 2.33     PROCEDURES:         C.T. Urogram - [id number]  He has an 76m proximal stone but also a 545mmid ureteral stone with ureteral dilation proximal to the mid ureteral stone.       Patient confirmed No Neulasta OnPro  Device.          KUB - [id number] single view of the abdomen is obtained. There is a possible 34m55mroximal stone that was previously in the LLP. He has small right renal stones. He has stable lumbar DDD. He has vascular calcifications. He has no gas or soft tissue abnormalities.       Patient confirmed No Neulasta OnPro Device.           Urinalysis w/Scope Dipstick Dipstick Cont'd Micro  Color: Red Bilirubin:  Neg mg/dL WBC/hpf: 0 - 5/hpf  Appearance: Slightly Cloudy Ketones: Trace mg/dL RBC/hpf: >60/hpf  Specific Gravity: 1.015 Blood: 3+ ery/uL Bacteria: Rare (0-9/hpf)  pH: 7.0 Protein: 1+ mg/dL Cystals: NS (Not Seen)  Glucose: 3+ mg/dL Urobilinogen: 0.2 mg/dL Casts: NS (Not Seen)    Nitrites: Neg Trichomonas: Not Present    Leukocyte Esterase: 1+ leu/uL Mucous: Not Present      Epithelial Cells: NS (Not Seen)      Yeast: NS (Not Seen)      Sperm: Not Present    Notes: Unspun micro due to increased RBC's.    ASSESSMENT:      ICD-10 Details  1 GU:   Ureteral calculus - N20.1 Acute, Systemic Symptoms - Kenneth [name] has left mid and proximal ureteral stone with obstruction and pain. I discussed options for treatment and will get him set up for ureteroscopy on Monday. I will have him hold his Eliquis after Saturday morning. I have reviewed the risks of ureteroscopy including bleeding, infection, ureteral injury, need for a stent or secondary procedures, thrombotic events and anesthetic complications.    2   Ureteral obstruction secondary to calculous - N13.2 Acute, Systemic Symptoms  3   Periumbilical pain - Z61.09 Acute, Uncomplicated  4   Gross hematuria - U04.5 Acute, Uncomplicated  5   ED due to arterial insufficiency - N52.01 Chronic, Stable - sildenafil redispensed.    PLAN:            Medications New Meds: Hydromorphone Hcl 2 mg tablet 1 tablet PO Q 6 H PRN   #15  0 Refill(s)  Pharmacy Name:  [facility]  Address:  [address]   [place], [place] [address]   Phone:  [phone]  Fax:  [phone]  Sildenafil Citrate 100 mg tablet 1 tablet PO Daily   #30  1 Refill(s)  Pharmacy Name:  [facility]  Address:  [address] [place], [place] [address]  Phone:  [phone]  Fax:  [phone]            Orders X-Rays: C.T. Stone Protocol Without I.V. Contrast  X-Ray Notes: . History:  Hematuria: Yes/No  Patient to see MD after exam: Yes/No  Previous exam: CT / IVP/ US/ KUB/ None  When:  Where:  Diabetic: Yes/ No  BUN/ Creatinine:  Date of last BUN Creatinine:  Weight in pounds:  Allergy- IV Contrast: Yes/ No  Conflicting diabetic meds: Yes/ No  Diabetic Meds:  Prior Authorization #: [id number]           Schedule Return Visit/Planned Activity: ASAP - Schedule Surgery

## 2021-10-22 NOTE — Anesthesia Postprocedure Evaluation (Signed)
Anesthesia Post Note  Patient: Kenneth [name]  Procedure(s) Performed: CYSTOSCOPY WITH LEFT RETROGRADE URETEROSCOPY WIHT HOLMIUM LASERAND STENT PLACEMENT (Left)     Patient location during evaluation: PACU Anesthesia Type: General Level of consciousness: sedated Pain management: pain level controlled Vital Signs Assessment: post-procedure vital signs reviewed and stable Respiratory status: spontaneous breathing and respiratory function stable Cardiovascular status: stable Postop Assessment: no apparent nausea or vomiting Anesthetic complications: no   No notable events documented.  Last Vitals:  Vitals:   10/22/21 1900 10/22/21 1915  BP: (!) 147/77 (!) 144/70  Pulse: 63 66  Resp: 19 20  Temp:  36.7 C  SpO2: 97% 99%    Last Pain:  Vitals:   10/22/21 1915  TempSrc:   PainSc: 0-No pain                 Trenee Igoe DANIEL

## 2021-10-22 NOTE — Interval H&P Note (Signed)
History and Physical Interval Note:  He had some hematuria over the weekend after mowing 11 acres.  The bleeding has stopped and he has no pain but hasn't seen a stone pass.  I will get a KUB preop.   10/22/2021 4:14 PM  [name]  has presented today for surgery, with the diagnosis of LEFT MID AND PROXIMAL URETERAL STONES.  The various methods of treatment have been discussed with the patient and family. After consideration of risks, benefits and other options for treatment, the patient has consented to  Procedure(s): CYSTOSCOPY WITH LEFT RETROGRADE URETEROSCOPY WIHT HOLMIUM LASERAND STENT PLACEMENT (Left) as a surgical intervention.  The patient's history has been reviewed, patient examined, no change in status, stable for surgery.  I have reviewed the patient's chart and labs.  Questions were answered to the patient's satisfaction.     Irine Seal

## 2021-10-22 NOTE — Anesthesia Preprocedure Evaluation (Addendum)
Anesthesia Evaluation  Patient identified by MRN, date of birth, ID band Patient awake    Reviewed: Allergy & Precautions, NPO status , Patient's Chart, lab work & pertinent test results  History of Anesthesia Complications (+) PONV and history of anesthetic complications  Airway Mallampati: II  TM Distance: >3 FB Neck ROM: Full    Dental  (+) Caps, Dental Advisory Given   Pulmonary sleep apnea and Continuous Positive Airway Pressure Ventilation , former smoker,    Pulmonary exam normal        Cardiovascular hypertension, Pt. on medications + angina + CAD, + Cardiac Stents and +CHF  + dysrhythmias Atrial Fibrillation + Valvular Problems/Murmurs AS  Rhythm:Regular Rate:Normal + Systolic murmurs Cath 5/62/1308 SUMMARY ? Stable two-vessel disease with exception of progression of in-stent restenosis disease in the stented portion of the LAD-2nd Diag is back to the LAD and branch with widely patent stent in the RCA. ? Culprit lesion is likely the LAD -2nd Diag bifurcation lesion which is in-stent restenosis (25% in the LAD and jailed sidebranch 70% stenosis of the 2nd Diag.  ? Successful Kevil balloon PTCA of the LAD ISR with semicompliant balloon PTCA kissing balloon into the 2nd Diag side branch -> reducing lesions to 25% LAD, 30% diagonal with TIMI-3 flow preserved. ? Borderline Pulmonary Hypertension with mean PA pressure of 24 mmHg in the setting of LVEDP of 12 mmHg and PCWP pressure of 28 mmHg. ? Normal Cardiac Output-Index by Fick: 9.2-4.0. ? At least moderate aortic stenosis with mean gradient in the Cath Lab at 33 million mercury, higher than echo gradient of 26 mm reviewed. ? Borderline systolic blood pressures.       Neuro/Psych negative neurological ROS  negative psych ROS   GI/Hepatic GERD  ,  Endo/Other  diabetes, Type 2  Renal/GU Renal disease     Musculoskeletal  (+) Arthritis ,   Abdominal   Peds   Hematology  (+) Blood dyscrasia, anemia ,   Anesthesia Other Findings   Reproductive/Obstetrics                           Anesthesia Physical  Anesthesia Plan  ASA: 3  Anesthesia Plan: General   Post-op Pain Management: Celebrex PO (pre-op)* and Tylenol PO (pre-op)*   Induction: Intravenous  PONV Risk Score and Plan: 4 or greater and Treatment may vary due to age or medical condition, Ondansetron, Dexamethasone and Diphenhydramine  Airway Management Planned: LMA  Additional Equipment: None  Intra-op Plan:   Post-operative Plan: Extubation in OR  Informed Consent: I have reviewed the patients History and Physical, chart, labs and discussed the procedure including the risks, benefits and alternatives for the proposed anesthesia with the patient or authorized representative who has indicated his/her understanding and acceptance.     Dental advisory given  Plan Discussed with: Anesthesiologist, CRNA and Surgeon  Anesthesia Plan Comments:        Anesthesia Quick Evaluation

## 2021-10-22 NOTE — Transfer of Care (Signed)
Immediate Anesthesia Transfer of Care Note  Patient: Kenneth [name]  Procedure(s) Performed: CYSTOSCOPY WITH LEFT RETROGRADE URETEROSCOPY WIHT HOLMIUM LASERAND STENT PLACEMENT (Left)  Patient Location: PACU  Anesthesia Type:General  Level of Consciousness: awake, alert , oriented, drowsy and patient cooperative  Airway & Oxygen Therapy: Patient Spontanous Breathing and Patient connected to face mask oxygen  Post-op Assessment: Report given to RN and Post -op Vital signs reviewed and stable  Post vital signs: Reviewed and stable  Last Vitals:  Vitals Value Taken Time  BP 136/77 10/22/21 1846  Temp    Pulse 67 10/22/21 1848  Resp 19 10/22/21 1848  SpO2 100 % 10/22/21 1848  Vitals shown include unvalidated device data.  Last Pain:  Vitals:   10/22/21 1541  TempSrc:   PainSc: 0-No pain         Complications: No notable events documented.

## 2021-10-23 ENCOUNTER — Encounter (HOSPITAL_COMMUNITY): Payer: Self-pay | Admitting: Urology

## 2021-10-29 DIAGNOSIS — N201 Calculus of ureter: Secondary | ICD-10-CM | POA: Diagnosis not present

## 2021-10-29 DIAGNOSIS — N132 Hydronephrosis with renal and ureteral calculous obstruction: Secondary | ICD-10-CM | POA: Diagnosis not present

## 2021-11-09 ENCOUNTER — Other Ambulatory Visit: Payer: Self-pay | Admitting: Family

## 2021-11-09 DIAGNOSIS — I5032 Chronic diastolic (congestive) heart failure: Secondary | ICD-10-CM

## 2021-11-09 NOTE — Telephone Encounter (Signed)
Pt of Dr. Ellyn Hack. Please review for refill. Thank you!

## 2021-11-12 DIAGNOSIS — H35373 Puckering of macula, bilateral: Secondary | ICD-10-CM | POA: Diagnosis not present

## 2021-11-12 DIAGNOSIS — H35342 Macular cyst, hole, or pseudohole, left eye: Secondary | ICD-10-CM | POA: Diagnosis not present

## 2021-11-12 DIAGNOSIS — H04123 Dry eye syndrome of bilateral lacrimal glands: Secondary | ICD-10-CM | POA: Diagnosis not present

## 2021-11-21 DIAGNOSIS — H04123 Dry eye syndrome of bilateral lacrimal glands: Secondary | ICD-10-CM | POA: Diagnosis not present

## 2021-11-21 DIAGNOSIS — H35373 Puckering of macula, bilateral: Secondary | ICD-10-CM | POA: Diagnosis not present

## 2021-11-21 DIAGNOSIS — H35342 Macular cyst, hole, or pseudohole, left eye: Secondary | ICD-10-CM | POA: Diagnosis not present

## 2021-12-02 ENCOUNTER — Other Ambulatory Visit: Payer: Self-pay | Admitting: Cardiology

## 2021-12-02 DIAGNOSIS — I5032 Chronic diastolic (congestive) heart failure: Secondary | ICD-10-CM

## 2021-12-11 DIAGNOSIS — Z85828 Personal history of other malignant neoplasm of skin: Secondary | ICD-10-CM | POA: Diagnosis not present

## 2021-12-11 DIAGNOSIS — L821 Other seborrheic keratosis: Secondary | ICD-10-CM | POA: Diagnosis not present

## 2021-12-21 ENCOUNTER — Ambulatory Visit: Payer: Medicare Other | Attending: Cardiology | Admitting: Cardiology

## 2021-12-21 ENCOUNTER — Encounter: Payer: Self-pay | Admitting: Cardiology

## 2021-12-21 VITALS — BP 130/62 | HR 73 | Ht 75.0 in | Wt 216.8 lb

## 2021-12-21 DIAGNOSIS — I10 Essential (primary) hypertension: Secondary | ICD-10-CM | POA: Insufficient documentation

## 2021-12-21 DIAGNOSIS — I484 Atypical atrial flutter: Secondary | ICD-10-CM | POA: Diagnosis not present

## 2021-12-21 DIAGNOSIS — E1169 Type 2 diabetes mellitus with other specified complication: Secondary | ICD-10-CM | POA: Insufficient documentation

## 2021-12-21 DIAGNOSIS — D6869 Other thrombophilia: Secondary | ICD-10-CM | POA: Diagnosis not present

## 2021-12-21 DIAGNOSIS — R0609 Other forms of dyspnea: Secondary | ICD-10-CM | POA: Insufficient documentation

## 2021-12-21 DIAGNOSIS — Z9861 Coronary angioplasty status: Secondary | ICD-10-CM | POA: Diagnosis not present

## 2021-12-21 DIAGNOSIS — E785 Hyperlipidemia, unspecified: Secondary | ICD-10-CM | POA: Insufficient documentation

## 2021-12-21 DIAGNOSIS — I350 Nonrheumatic aortic (valve) stenosis: Secondary | ICD-10-CM | POA: Insufficient documentation

## 2021-12-21 DIAGNOSIS — E663 Overweight: Secondary | ICD-10-CM | POA: Diagnosis not present

## 2021-12-21 DIAGNOSIS — I2510 Atherosclerotic heart disease of native coronary artery without angina pectoris: Secondary | ICD-10-CM | POA: Diagnosis not present

## 2021-12-21 DIAGNOSIS — I25119 Atherosclerotic heart disease of native coronary artery with unspecified angina pectoris: Secondary | ICD-10-CM | POA: Diagnosis not present

## 2021-12-21 DIAGNOSIS — I5032 Chronic diastolic (congestive) heart failure: Secondary | ICD-10-CM | POA: Diagnosis not present

## 2021-12-21 DIAGNOSIS — I480 Paroxysmal atrial fibrillation: Secondary | ICD-10-CM | POA: Diagnosis not present

## 2021-12-21 MED ORDER — FUROSEMIDE 20 MG PO TABS: 20.0000 mg | ORAL_TABLET | ORAL | 3 refills | Status: DC

## 2021-12-21 NOTE — Assessment & Plan Note (Signed)
Has had redo ablation in 2015.  Remains on DOAC because of some recurrence but also PAF.

## 2021-12-21 NOTE — Assessment & Plan Note (Signed)
Due for follow-up labs.  For some reason they have not yet been drawn for today.  As of last year his triglycerides were little high but TC and LDL look great.  As well as well controlled HDL.   For now monitor continue with current dose of statin but should be due for lipid to be checked.  Last A1c was 6.3.  He is on [place] which we could theoretically increase to the diabetes mellitus dose.  Thankfully, symptoms have never been an issue.  We ordered labs which activated to reassess.

## 2021-12-21 NOTE — Assessment & Plan Note (Addendum)
Chronic HFpEF seems to be pretty well controlled.  I think with the weight loss this is definitely improving but a lot of his HFpEF symptoms were probably an anginal equivalent. We are using the combination of Toprol plus diltiazem as opposed to Toprol plus ARB to allow for better rate control and antianginal benefit.  He is on standing dose of Lasix which I think with his dizziness we can back down to 3 tablets a week with additional tablets as needed for worsening edema or dyspnea.  It is likely that he requires less diuretic with the addition of [place].  Continue Wilder Glade (or other SGLT2 inhibitor) for now.  Somewhat because of neuropathy, his dyspnea from lung disease and HFpEF, there are intermittent episodes where he would probably not do well trying to walk into the office.  I have therefore agreed to sign a long-term handicap parking decal.

## 2021-12-21 NOTE — Assessment & Plan Note (Signed)
Seems to be somewhat dormant at this point.  He is now on the combination of beta-blocker and diltiazem with apixaban for preprocedural management.  Since he has had issues with his stent, I would like to keep him on Plavix for longer, but it can be interrupted at this point.  Continue to titrate lipid management and monitor blood pressure control.  Thankfully, he has not any breakthrough spells.

## 2021-12-21 NOTE — Assessment & Plan Note (Signed)
Notably improved.  Seems to be back down to class I and mostly just dyspnea.  Not nearly at the level that he had been to prior to PCI.Marland Kitchen

## 2021-12-21 NOTE — Progress Notes (Signed)
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 (two) times daily.  . Biotin 2500 MCG CAPS Take 5,000 mcg by mouth in the morning.  . Calcium Carbonate-Vit D-Min (CALCIUM 1200 PO) Take 1 tablet by mouth every evening.  . cetirizine (ZYRTEC) 10 MG tablet Take 10 mg by mouth in the morning.  . clopidogrel (PLAVIX) 75 MG tablet Take 1 tablet (75 mg total) by mouth daily.  . dapagliflozin propanediol (FARXIGA) 10 MG TABS tablet TAKE 1 TABLET BY MOUTH ONCE DAILY BEFORE BREAKFAST  . diltiazem (CARDIZEM CD) 180 MG 24 hr capsule Take 1 capsule (180 mg total) by mouth daily. (Patient taking differently: Take 180 mg by mouth every evening.)  . ferrous sulfate 325 (65 FE) MG tablet Take 325 mg by mouth every evening.  . furosemide (LASIX) 20 MG tablet Take 1 tablet (20 mg total) by mouth daily. Take additional tablet as needed for weight gain of 2 pounds overnight or 5 pounds in one week. Take additional '20mg'$  (1 tablet) on MWF  . gabapentin (NEURONTIN) 300 MG capsule Take 300-600 mg by mouth See admin instructions. Take 1 capsule (300 mg) by mouth in the morning and 2 capsules (600 mg) by mouth in th evening   . galantamine (RAZADYNE ER) 24 MG 24 hr capsule Take 24 mg by mouth in the morning.  . memantine (NAMENDA) 5 MG tablet Take 10 mg by mouth at bedtime.  . metFORMIN (GLUCOPHAGE-XR) 500 MG 24 hr tablet Take 500 mg by mouth at bedtime.  . metoprolol succinate (TOPROL XL) 25 MG 24 hr tablet Take 1 tablet (25 mg total) by mouth daily.  . nitroGLYCERIN (NITROSTAT) 0.4 MG SL tablet Place 1 tablet (0.4 mg total) under the tongue every 5 (five) minutes as needed.  Vladimir Faster Glycol-Propyl Glycol (SYSTANE OP) Place 1 drop into both eyes 3 (three) times daily as needed (dry/irritated eyes.).  Marland Kitchen potassium chloride SA (KLOR-CON M) 20 MEQ tablet Take 1 tablet on days you take additional lasix  . prazosin (MINIPRESS) 1 MG capsule Take 3 mg by mouth at bedtime.  . predniSONE (DELTASONE) 5 MG tablet Take 1 tablet by mouth once daily with breakfast  . ranolazine (RANEXA) 500 MG 12 hr tablet Take 1 tablet (500 mg total) by mouth 2 (two) times daily.  . rosuvastatin (CRESTOR) 20 MG tablet Take 1 tablet (20 mg total) by mouth daily. (Patient taking differently: Take 20 mg by mouth every evening.)  . tamsulosin (FLOMAX) 0.4 MG CAPS capsule Take 1 capsule by mouth once daily (Patient taking differently: Take 0.4 mg by mouth daily as needed (kidney stones).)    Allergies  Allergen Reactions  . Hydrocodone Shortness Of Breath    In combination with decongestants  . Other Palpitations and Other (See Comments)  ALL DECONGESTANTS - CAUSE PALPITATIONS; THROWS HEART RHYTHM OUT OF BALANCE  . Oxycodone Shortness Of Breath  . Sudafed [Pseudoephedrine] Palpitations  . Tramadol Shortness Of Breath and Rash    flushing  . Lopid [Gemfibrozil] Other (See Comments)    Dizziness      SOCIAL HISTORY/FAMILY HISTORY   Reviewed in Epic:  Pertinent findings:  Social History   Tobacco Use  . Smoking status: Former    Types: Cigars    Quit date: 09/29/1997    Years since quitting: 24.2  . Smokeless tobacco: Former     Types: Snuff, Chew  . Tobacco comments:    Form smoker and chew tobacco 01/25/2021  Vaping Use  . Vaping Use: Never used  Substance Use Topics  . Alcohol use: No  . Drug use: No   Social History   Social History Narrative   Pt lives in [place] with spouse.     Retired.    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       After retiring from [employer].        Had been very active as an Hotel manager"  - now doing his best to "stay out of this mess of politics" -- has declined efforts to get him to run for office.      Now is primary caregiver for his wife Judeen Hammans) - who has metastatic CA & has had CVA x 2.  Pretty much "total care".  Very emotionally taxing.      He has plans to build homes for all of his children & grandchildren on their property - to ensure they all have a place to live - Not transferable    OBJCTIVE -PE, EKG, labs   Wt Readings from Last 3 Encounters:  12/21/21 216 lb 12.8 oz (98.3 kg)  10/22/21 213 lb (96.6 kg)  10/16/21 213 lb 3.2 oz (96.7 kg)  -All told about a 60 pound weight loss in 2 years  Physical Exam: BP 130/62   Pulse 73   Ht '6\' 3"'$  (1.905 m)   Wt 216 lb 12.8 oz (98.3 kg)   SpO2 99%   BMI 27.10 kg/m  Physical Exam Vitals reviewed.  Constitutional:      General: He is not in acute distress.    Appearance: Normal appearance. He is normal weight. He is not ill-appearing or toxic-appearing.     Comments: Actually healthy-appearing.  He is lost a lot of weight feet.  Looks good and strong.  Looks to be in better spirits.  Less depressed  HENT:     Head: Normocephalic and atraumatic.  Neck:     Vascular: No carotid bruit or JVD (Mildly elevated-7-8 cm water.).  Cardiovascular:     Rate and Rhythm: Normal rate and regular rhythm. Occasional Extrasystoles are present.    Chest Wall:  PMI is not displaced.     Pulses: Normal pulses.     Heart sounds: S1 normal and S2 normal. Heart sounds are distant. Murmur (2/6 SEM at RUSB-carotids) heard.     No friction rub. Gallop present. S4 sounds present.  Pulmonary:     Effort: Pulmonary effort is normal. No respiratory distress.     Breath sounds: No wheezing or rhonchi.     Comments: Bibasal but right greater than left crackles.  Otherwise mostly CTA B.  Nonlabored.  Good  good air movement.  No more tachypnea or forced expiration Chest:     Chest wall: No tenderness.  Musculoskeletal:        General: Swelling (Trivial to 1+) present. Normal range of motion.     Cervical back: Normal range of motion and neck supple.  Skin:    General: Skin is warm and dry.  Neurological:     General: No focal deficit present.     Mental Status: He is alert and oriented to person, place, and time.     Gait: Gait normal.  Psychiatric:        Mood and Affect: Mood normal.        Behavior: Behavior normal.        Thought Content: Thought content normal.        Judgment: Judgment normal.    Adult ECG Report Not checked  Recent Labs: Reviewed.  Labs ordered today Lab Results  Component Value Date   CHOL 165 10/05/2020   HDL 53 10/05/2020   LDLCALC 70 10/05/2020   TRIG 259 (H) 10/05/2020   CHOLHDL 3.1 10/05/2020   Lab Results  Component Value Date   CREATININE 0.93 10/22/2021   BUN 9 10/22/2021   NA 137 10/22/2021   K 3.8 10/22/2021   CL 108 10/22/2021   CO2 22 10/22/2021      Latest Ref Rng & Units 10/22/2021    3:30 PM 07/04/2021   10:54 AM 07/04/2021   10:49 AM  CBC  WBC 4.0 - 10.5 K/uL 6.9     Hemoglobin 13.0 - 17.0 g/dL 11.5  10.9    10.9  10.5   Hematocrit 39.0 - 52.0 % 35.4  32.0    32.0  31.0   Platelets 150 - 400 K/uL 200       Lab Results  Component Value Date   HGBA1C 6.3 (H) 10/22/2021   Lab Results  Component Value Date   TSH 1.360 01/31/2021    ================================================== I  spent a total of 49 minutes with the patient spent in direct patient consultation.  Additional time spent with chart review  / charting (studies, outside notes, etc): 26 min Total Time: 74 min  Current medicines are reviewed at length with the patient today.  (+/- concerns) n.a  Notice: This dictation was prepared with Dragon dictation along with smart phrase technology. Any transcriptional errors that result from this process are unintentional and may not be corrected upon review.  Studies Ordered:   Orders Placed This Encounter  Procedures  . CBC  . Comprehensive metabolic panel  . Lipid panel  . ECHOCARDIOGRAM COMPLETE   Meds ordered this encounter  Medications  . furosemide (LASIX) 20 MG tablet    Sig: Take 1 tablet (20 mg total) by mouth 2 (two) times a week. (Monday and Thursday). Ok to take additional tablet as needed for weight gain of 2 pounds overnight or 5 pounds in one week.    Dispense:  30 tablet    Refill:  3    Patient Instructions / Medication Changes & Studies & Tests Ordered   Patient Instructions  Medication Instructions:  Take furosemide (Lasix) 20 mg two times weekly (Monday and Thursday) --ok to take additional 20 mg as needed for swelling or weight increase  *If you need a refill on your cardiac medications before your next appointment, please call your pharmacy*   Lab Work: CMET, CBC, Lipid today  If you have labs (blood work) drawn today and your tests  are completely normal, you will receive your results only by: MyChart Message (if you have MyChart) OR A paper copy in the mail If you have any lab test that is abnormal or we need to change your treatment, we will call you to review the results.   Testing/Procedures: Your physician has requested that you have an echocardiogram in January 2024. Echocardiography is a painless test that uses sound waves to create images of your heart. It provides your doctor with information about the size and shape  of your heart and how well your heart's chambers and valves are working. This procedure takes approximately one hour. There are no restrictions for this procedure.  Follow-Up: At [facility], you and your health needs are our priority.  As part of our continuing mission to provide you with exceptional heart care, we have created designated Provider Care Teams.  These Care Teams include your primary Cardiologist (physician) and Advanced Practice Providers (APPs -  Physician Assistants and Nurse Practitioners) who all work together to provide you with the care you need, when you need it.  We recommend signing up for the patient portal called "MyChart".  Sign up information is provided on this After Visit Summary.  MyChart is used to connect with patients for Virtual Visits (Telemedicine).  Patients are able to view lab/test results, encounter notes, upcoming appointments, etc.  Non-urgent messages can be sent to your provider as well.   To learn more about what you can do with MyChart, go to [email or web address].    Your next appointment:   6 month(s)  The format for your next appointment:   In Person  Provider:   [name], MD         Leonie Man, MD, MS [name], M.D., M.S. Interventional Cardiologist  [facility]  Pager # [phone] Phone # [phone] [place], [place] [address]   Thank you for choosing [facility]!!

## 2021-12-21 NOTE — Assessment & Plan Note (Signed)
He does have pretty good murmur and with him still having some dyspnea symptoms I would like to go ahead and further follow-up his valve.  He is due for an echocardiogram that can be checked prior to his next follow-up visit.  We talked about the concerning symptoms related to rapid rhythm of atrial fibrillation.

## 2021-12-21 NOTE — Assessment & Plan Note (Signed)
He has done well with weight loss.  Hopefully can just stay stable over years now.  Hopefully this will also help his breathing.

## 2021-12-21 NOTE — Patient Instructions (Signed)
Medication Instructions:  Take furosemide (Lasix) 20 mg two times weekly (Monday and Thursday) --ok to take additional 20 mg as needed for swelling or weight increase  *If you need a refill on your cardiac medications before your next appointment, please call your pharmacy*   Lab Work: CMET, CBC, Lipid today  If you have labs (blood work) drawn today and your tests are completely normal, you will receive your results only by: [place] (if you have MyChart) OR A paper copy in the mail If you have any lab test that is abnormal or we need to change your treatment, we will call you to review the results.   Testing/Procedures: Your physician has requested that you have an echocardiogram in January 2024. Echocardiography is a painless test that uses sound waves to create images of your heart. It provides your doctor with information about the size and shape of your heart and how well your heart's chambers and valves are working. This procedure takes approximately one hour. There are no restrictions for this procedure.  Follow-Up: At [facility], you and your health needs are our priority.  As part of our continuing mission to provide you with exceptional heart care, we have created designated Provider Care Teams.  These Care Teams include your primary Cardiologist (physician) and Advanced Practice Providers (APPs -  Physician Assistants and Nurse Practitioners) who all work together to provide you with the care you need, when you need it.  We recommend signing up for the patient portal called "MyChart".  Sign up information is provided on this After Visit Summary.  MyChart is used to connect with patients for Virtual Visits (Telemedicine).  Patients are able to view lab/test results, encounter notes, upcoming appointments, etc.  Non-urgent messages can be sent to your provider as well.   To learn more about what you can do with MyChart, go to [email or web address].    Your next  appointment:   6 month(s)  The format for your next appointment:   In Person  Provider:   [name], MD

## 2021-12-21 NOTE — Assessment & Plan Note (Signed)
Doing much better with significantly improved overall dyspnea and angina symptoms following last PTCA.  No longer having any complaints of chest tightness or pressure with rest exertion.  Seems to doing better with the reduced dose of diltiazem, less fatigue.  Plan  He is on an interesting combination of metoprolol and diltiazem.  He does better with moderate doses of both versus high doses of either Medicomp side effects.  Both for rate control and antianginal benefit.  Additional antianginal benefit with the addition of Ranexa 500 mg twice daily-with relatively well-controlled angina.  Continue Wilder Glade although there has been concern about getting Farxiga through the [place] => I will change the prescription to be under my name as opposed to the APP's name.  They will also check to see if Vania Rea is covered by the [place] since Wilder Glade is not.  If so we would switch.  He is on stable dose of rosuvastatin.  Remains on Plavix and Eliquis for now.    Plan will be to continue Plavix for 6 months post PCI which would put hi for now, along with Eliquis, but as of October 2023, would be safe to hold 1 or both for procedures.

## 2021-12-21 NOTE — Assessment & Plan Note (Addendum)
Unfortunately both CAD and A-fib, he is on combination of Plavix and Eliquis Eliquis  This patients CHA2DS2-VASc Score and unadjusted Ischemic Stroke Rate (% per year) is equal to 7.2 % stroke rate/year from a score of 5  Above score calculated as 1 point each if present [CHF, HTN, DM, Vascular=MI/PAD/Aortic Plaque, Age if 65-74, or Male] Above score calculated as 2 points each if present [Age > 75, or Stroke/TIA/TE]

## 2021-12-21 NOTE — Assessment & Plan Note (Signed)
DES to the LAD with ISR requiring PTCA.  I would like to keep him on Plavix for longer than just 6 months but may have to stop for bleeding or bruising.  For now since he is stable we will continue with Eliquis and Plavix.  As of January 06, 2022, okay to hold Plavix 5 to 7 days preop for surgeries or procedures.

## 2021-12-22 LAB — LIPID PANEL
Chol/HDL Ratio: 3.1 ratio (ref 0.0–5.0)
Cholesterol, Total: 187 mg/dL (ref 100–199)
HDL: 60 mg/dL (ref >39)
LDL Chol Calc (NIH): 93 mg/dL (ref 0–99)
Triglycerides: 203 mg/dL — ABNORMAL HIGH (ref 0–149)
VLDL Cholesterol Cal: 34 mg/dL (ref 5–40)

## 2021-12-22 LAB — COMPREHENSIVE METABOLIC PANEL
ALT: 15 IU/L (ref 0–44)
AST: 17 IU/L (ref 0–40)
Albumin/Globulin Ratio: 2 (ref 1.2–2.2)
Albumin: 5 g/dL — ABNORMAL HIGH (ref 3.8–4.8)
Alkaline Phosphatase: 80 IU/L (ref 44–121)
BUN/Creatinine Ratio: 10 (ref 10–24)
BUN: 11 mg/dL (ref 8–27)
Bilirubin Total: 0.4 mg/dL (ref 0.0–1.2)
CO2: 24 mmol/L (ref 20–29)
Calcium: 9.8 mg/dL (ref 8.6–10.2)
Chloride: 101 mmol/L (ref 96–106)
Creatinine, Ser: 1.05 mg/dL (ref 0.76–1.27)
Globulin, Total: 2.5 g/dL (ref 1.5–4.5)
Glucose: 138 mg/dL — ABNORMAL HIGH (ref 70–99)
Potassium: 4.2 mmol/L (ref 3.5–5.2)
Sodium: 141 mmol/L (ref 134–144)
Total Protein: 7.5 g/dL (ref 6.0–8.5)
eGFR: 74 mL/min/{1.73_m2} (ref >59)

## 2021-12-22 LAB — CBC
Hematocrit: 40.4 % (ref 37.5–51.0)
Hemoglobin: 13.4 g/dL (ref 13.0–17.7)
MCH: 29.1 pg (ref 26.6–33.0)
MCHC: 33.2 g/dL (ref 31.5–35.7)
MCV: 88 fL (ref 79–97)
Platelets: 202 10*3/uL (ref 150–450)
RBC: 4.6 x10E6/uL (ref 4.14–5.80)
RDW: 13.6 % (ref 11.6–15.4)
WBC: 6.3 10*3/uL (ref 3.4–10.8)

## 2021-12-24 NOTE — Progress Notes (Signed)
Addendum to clinic note from 12/21/2021:  Lab results: Blood sugars remain high.  Otherwise kidney and liver function and electrolytes look good.  Cholesterol panel on the other hand went the wrong direction.  Total cholesterol increased from 165 up to 187.  LDL (bad cholesterol increased from 70 up to 93.  I would like to see if Kenneth [name] will be able to tolerate taking 40 mg of rosuvastatin which is twice his current dose.  Monitor for side effects.  If he does not tolerate rosuvastatin at the higher dose, we could then try to add Nexlizet instead.  Rec: Increase rosuvastatin to 40 mg daily (2 of current tabs daily until prescription done),  Change Rx to rosuvastatin 40 mg p.o. daily, dispense 30 tablets with 11 refills. (If tolerated, we can change to 90-day supply). .  3.mec

## 2021-12-28 ENCOUNTER — Telehealth: Payer: Self-pay | Admitting: *Deleted

## 2021-12-28 MED ORDER — ROSUVASTATIN CALCIUM 40 MG PO TABS: 40.0000 mg | ORAL_TABLET | Freq: Every day | ORAL | 3 refills | Status: DC

## 2021-12-28 NOTE — Telephone Encounter (Signed)
Spoke to patient 's wife -   she requested  medication be sent to [place] at [facility] Rx sent .  She is aware to call office if patient can not tolerate  medication will try Nexlizet

## 2021-12-28 NOTE — Telephone Encounter (Signed)
-----   Message from Leonie Man, MD sent at 12/24/2021 10:54 PM EDT ----- Addendum to clinic note from 12/21/2021:  Lab results: Blood sugars remain high.  Otherwise kidney and liver function and electrolytes look good.  Cholesterol panel on the other hand went the wrong direction.  Total cholesterol increased from 165 up to 187.  LDL (bad cholesterol increased from 70 up to 93.  I would like to see if [place] will be able to tolerate taking 40 mg of rosuvastatin which is twice his current dose.  Monitor for side effects.  If he does not tolerate rosuvastatin at the higher dose, we could then try to add Nexlizet instead.  Rec: Increase rosuvastatin to 40 mg daily (2 of current tabs daily until prescription done),  Change Rx to rosuvastatin 40 mg p.o. daily, dispense 30 tablets with 11 refills. (If tolerated, we can change to 90-day supply). .  3.mec

## 2021-12-28 NOTE — Telephone Encounter (Signed)
Addendum to clinic note from 12/21/2021:   Lab results: Blood sugars remain high.  Otherwise kidney and liver function and electrolytes look good.   Cholesterol panel on the other hand went the wrong direction.  Total cholesterol increased from 165 up to 187.  LDL (bad cholesterol increased from 70 up to 93.   I would like to see if Kenneth [name] will be able to tolerate taking 40 mg of rosuvastatin which is twice his current dose.  Monitor for side effects.   If he does not tolerate rosuvastatin at the higher dose, we could then try to add Nexlizet instead.   Rec: Increase rosuvastatin to 40 mg daily (2 of current tabs daily until prescription done),  Change Rx to rosuvastatin 40 mg p.o. daily, dispense 30 tablets with 11 refills.  (If tolerated, we can change to 90-day supply).

## 2022-01-15 ENCOUNTER — Telehealth: Payer: Self-pay | Admitting: Cardiology

## 2022-01-15 NOTE — Telephone Encounter (Signed)
Spouse stated patient has been sob since yesterday and is a bit worse today. She is concerned about patient's stent being occluded. Spoke with patient who reports sob at rest and with exertion. He denies lightheadedness or dizziness. Spoke with Dr. Sallyanne Kuster (DOD), who ordered for patient to be seen in clinic. Appointment made with Thomasene Mohair for 10/11.

## 2022-01-15 NOTE — Telephone Encounter (Signed)
Just to give you an FYI that Kenneth [name] is scheduled to see you tomorrow.  He has a history of 2 overlapping stents in the LAD and the distal recently did angioplasty of in-stent restenosis.  There is some jailing of the diagonal branch, but had TIMI-3 flow. He has a lot of anxiety and stress that exacerbates symptoms.  Very difficult to read.  Would like to try to temporize -> last we checked a stress test and did not show any ischemia and he went a few months without having any issues.  I will let you feel him out and see what you think.  I am in the clinic tomorrow and we can talk about it.   [name], MD

## 2022-01-15 NOTE — Progress Notes (Addendum)
Cardiology Clinic Note   Patient Name: Kenneth [name] Date of Encounter: 01/16/2022  Primary Care Provider:  Landry Mellow, MD Primary Cardiologist:  [name], MD  Patient Profile    Kenneth [name] is a 76 year old male presents the clinic for follow-up evaluation of his coronary artery disease and chronic CHF.  Past Medical History    Past Medical History:  Diagnosis Date   Arthritis    CAD S/P percutaneous coronary angioplasty 06/28/2016   Cardiologist- Dr. Ellyn Hack: 06/28/16 -- [facility] PCI (Synergy DES 2.75 x 20 mm); 07/10/16: Staged p-m LAD PCI (FFR 0.75) - DES PCI (Synergy DES 3.5 x 20 mm); 10/11/2020: mid-distal LAD 70% (FFR 0.75) DES PCI (Synergy DES 2.5 mm x 32 mm - overlaps prox-mid stent, tapered post-dilation 3.7 - 2.6 mm.;  06/2021 - Cath with severe LAD-D2 bifurcation ISR -- PTCA / kissing balloon - reduced LAD to ~25% & D2 to 40%   Chronic diastolic heart failure ([place])    (Cardiologist: Dr. Ellyn Hack): EF > 75% / Hyperdynamic LV Fxn - indeterminate Diastolic Fxn.   Chronic dryness of both eyes    Chronic heart failure with preserved ejection fraction (HFpEF) ([place]) 05/22/2013   Diverticulosis of colon    Epiretinal membrane    "epiretinal attachment" (06/28/2016) 10-14-2017 per pt oringinally in both , now only unilateral    GERD (gastroesophageal reflux disease)    Hiatal hernia    History of adenomatous polyp of colon    History of concussion 08/23/2017   w/o loc--- per pt no residual   History of kidney stones    History of rheumatic fever 1958   NO Evidence of Vavlular disease -> only mild Aortic Sclerosis   History of skin cancer    excision leg;  froze the left arm--- unsure BCC or SCC    Hyperlipidemia    Hypertension    ILD (interstitial lung disease) ([place])    pulmologist-  dr Elsworth Soho--  secondary to methotrexate use --- last PFTs 04-25-2015  mild restriction   Iron deficiency anemia    Mild obstructive sleep apnea    10-04-2019 per pt  had a sleep  study years ago , was told no cpap recommended pt denies    Nephrolithiasis    CT 09-07-2019 bilateral nonobstructive stones   Paroxysmal atrial fibrillation [facility]) cardiologist-  dr harding/  EP-- dr Rayann Heman   a. 11/2003 Tikosyn initiated - subsequently d/c'd;  b. 2006 RFCA for Afib @ [place];  c. 09/2011 Echo: EF 60-65%, Gr 2 DD;  d. DCCV 10/2011 , 04/2012, 12/ 2014, & 02/ 2015;  e. RFCA 01-13-2014   Plaque psoriasis 1969   followed by rheumatologist  and dermatologist @ [facility] in [place]   PONV (postoperative nausea and vomiting)    Right ureteral stone    S/P drug eluting coronary stent placement    06-28-2016  x1 to mRCA;  07-10-2016 x1 to mLAD   Seasonal allergies    Short-term memory loss    Type 2 diabetes mellitus ([place])    followed by pcp   Ureteral calculus, right    Wears hearing aid in both ears    Past Surgical History:  Procedure Laterality Date   [place] N/A 01/13/2014   repeat PVI, also left atrial ablation performed with successfull ablation of LA flutter by Dr Rayann Heman   ATRIAL FIBRILLATION ABLATION  08/2004   Dr Rolland Porter at [facility] [place])   CARDIOVERSION N/A  05/22/2013   Procedure: CARDIOVERSION;  Surgeon: [name], MD;  Location: [place];  Service: Cardiovascular;  Laterality: N/A;   CARDIOVERSION N/A 10/23/2011   Procedure: CARDIOVERSION;  Surgeon: Deboraha Sprang, MD;  Location: [facility] CATH LAB;  Service: Cardiovascular;  Laterality: N/A;   CARDIOVERSION N/A 05/01/2012   Procedure: CARDIOVERSION;  Surgeon: Deboraha Sprang, MD;  Location: [facility] CATH LAB;  Service: Cardiovascular;  Laterality: N/A;   CARPAL TUNNEL RELEASE Right 1980s   CATARACT EXTRACTION W/ INTRAOCULAR LENS  IMPLANT, BILATERAL  2012  approx.   CORONARY BALLOON ANGIOPLASTY N/A 07/04/2021   Procedure: CORONARY BALLOON ANGIOPLASTY;  Surgeon: Leonie Man, MD;  Location: [place] CV LAB;  Service: Cardiovascular;; scoring balloon and an [place] balloon PTCA of  LAD; with kissing balloon PTCA of LAD-D2 bifurcation stented segment reducing LAD to 20 % ISR and ostial D2 to 40%.   CORONARY STENT INTERVENTION N/A 06/28/2016   Procedure: Coronary Stent Intervention;  Surgeon: Leonie Man, MD;  Location: [place] CV LAB;  Service: Cardiovascular;  Laterality: mRCA PCI -Synergy DES 2.75 m x 20 mm (3.1 mm)   CORONARY STENT INTERVENTION N/A 07/10/2016   Procedure: Coronary Stent Intervention;  Surgeon: Leonie Man, MD;  Location: [place] CV LAB;  Service: Cardiovascular: mLAD PCI Synergy DES 3.5 mm x 20 mm)   CORONARY STENT INTERVENTION N/A 10/11/2020   Procedure: CORONARY STENT INTERVENTION;  Surgeon: Leonie Man, MD;  Location: [place] CV LAB;  Service: Cardiovascular: mid-distal LAD ~70% (FFR 0.75): SYNERGY DES 2.5 MM X 32 MM (overlaps prox-mid Stent from 07/2016) - Tapered post-dilation 3.7 mm ->2.6 mm; Jailed D2 PTCA reduced 60% to 40%.   CYSTOSCOPY WITH RETROGRADE PYELOGRAM, URETEROSCOPY AND STENT PLACEMENT Right 10/16/2017   Procedure: CYSTOSCOPY WITH RIGHT RETROGRADE RIGHT URETEROSCOPY AND STONE BASKET EXTRACTION;  Surgeon: Irine Seal, MD;  Location: [facility];  Service: Urology;  Laterality: Right;   CYSTOSCOPY WITH RETROGRADE PYELOGRAM, URETEROSCOPY AND STENT PLACEMENT Right 10/08/2019   Procedure: CYSTOSCOPY WITH URETEROSCOPY; RETROGRADE PYELOGRAM; STONE BASKET EXTRACTION;  STENT PLACEMENT;  Surgeon: Cleon Gustin, MD;  Location: [facility];  Service: Urology;  Laterality: Right;  1 HR   CYSTOSCOPY WITH RETROGRADE PYELOGRAM, URETEROSCOPY AND STENT PLACEMENT Right 03/28/2020   Procedure: CYSTOSCOPY WITH RIGHT RETROGRADE PYELOGRAM, URETEROSCOPY WITH HOLMIUM LASER AND STENT PLACEMENT;  Surgeon: Irine Seal, MD;  Location: [facility];  Service: Urology;  Laterality: Right;   CYSTOSCOPY WITH RETROGRADE PYELOGRAM, URETEROSCOPY AND STENT PLACEMENT Left 10/22/2021   Procedure: CYSTOSCOPY WITH LEFT RETROGRADE  URETEROSCOPY WIHT HOLMIUM LASERAND STENT PLACEMENT;  Surgeon: Irine Seal, MD;  Location: [facility];  Service: Urology;  Laterality: Left;   CYSTOSCOPY/RETROGRADE/URETEROSCOPY/STONE EXTRACTION WITH BASKET Right 10/27/2017   Procedure: CYSTOSCOPY/RETROGRADE/URETEROSCOPY/STONE EXTRACTION WITH BASKET/URETERAL STENT PLACEMENT and laser;  Surgeon: Irine Seal, MD;  Location: [facility];  Service: Urology;  Laterality: Right;   EXTRACORPOREAL SHOCK WAVE LITHOTRIPSY Left 08/10/2020   Procedure: EXTRACORPOREAL SHOCK WAVE LITHOTRIPSY (ESWL);  Surgeon: Ardis Hughs, MD;  Location: [facility];  Service: Urology;  Laterality: Left;   HOLMIUM LASER APPLICATION Right 93/57/0177   Procedure: POSSIBLE HOLMIUM LASER APPLICATION;  Surgeon: Irine Seal, MD;  Location: [facility];  Service: Urology;  Laterality: Right;   HOLMIUM LASER APPLICATION Right 93/90/3009   Procedure: HOLMIUM LASER APPLICATION;  Surgeon: Cleon Gustin, MD;  Location: [facility];  Service: Urology;  Laterality: Right;   INTRAVASCULAR PRESSURE WIRE/FFR STUDY N/A 06/28/2016   Procedure: Intravascular  Pressure Wire/FFR Study;  Surgeon: Leonie Man, MD;  Location: [place] CV LAB;  Service: Cardiovascular: FFR of mLAD ~65% lesion = 0.75 post --> Staged PCI   INTRAVASCULAR PRESSURE WIRE/FFR STUDY N/A 10/11/2020   Procedure: INTRAVASCULAR PRESSURE WIRE/FFR STUDY;  Surgeon: Leonie Man, MD;  Location: [place] [facility];  Service: Cardiovascular;  Laterality: LAD mid-distal just after D2 --> FFR 0.75 SIGNIFICANT => PCI   LEFT HEART CATH AND CORONARY ANGIOGRAPHY  06/30/2003   Dr. Lia Foyer: NO Significant/Obstructive CAD; Preseved LVEF (for Recurrent Afib)   NASAL SINUS SURGERY     x 2   NM MYOVIEW LTD  02/11/2021   Lexiscan:EF 55-65%.  Normal function.  No ischemia or infarction.  No EKG changes noted.   PROXIMAL INTERPHALANGEAL FUSION (PIP) Left 01-05-2001    dr graves   correction clawtoe and extensor  tendon lengthening   RIGHT/LEFT HEART CATH AND CORONARY ANGIOGRAPHY N/A 06/28/2016   Procedure: Right/Left Heart Cath and Coronary Angiography;  Surgeon: Leonie Man, MD;  Location: [place] [facility];  Service: Cardiovascular: mRCA 99% (TIMI 2), mLAD ~65% (FFR 0.75), OM3 55%. mild Pulm HTN. Mod LVEDP elevation. EF 55-65%.   RIGHT/LEFT HEART CATH AND CORONARY ANGIOGRAPHY N/A 10/11/2020   Procedure: RIGHT/LEFT HEART CATH AND CORONARY ANGIOGRAPHY;  Surgeon: Leonie Man, MD;  Location: [place] CV LAB;; Patent DES in [employer] & p-mLAD. CULPRIT = m-d LAD 70% (just after D1 w/ Ost 60%) FFR 0.75 => DES PCI m-dLAD & PTCA ost D1 (overlapping prior stent distally & crossing D2 w/  Ost D2 post PTCA). RIGHT HEART CATH PRESSURES,  & CO-CI ARE NORMAL. NO PULM HTN   RIGHT/LEFT HEART CATH AND CORONARY ANGIOGRAPHY N/A 07/04/2021   Procedure: RIGHT/LEFT HEART CATH AND CORONARY ANGIOGRAPHY;  Surgeon: Leonie Man, MD;  Location: [place] CV LAB;  Service: Cardiovascular;; moderately elevated LVEDP of 20 mmHg, and PCWP-28 mmHg -> mean PAP of 24 mmHg.  Moderate AS-mean gradient 33 mmHg.  (Similar to Echo 26 mmHg); borderline low BP.  CO-CI 9.2 and 4.0.. Stable CAD W/ progression of ISR of LAD stent at D2 => PCI   ROTATOR CUFF REPAIR Bilateral last one 2014   TONSILLECTOMY  age 60   TRANSTHORACIC ECHOCARDIOGRAM  09/29/2020   EF > 75%, Hyperdynamic. No RWMA. Mild LVH. ~ DD with mild LA dilation. Mild AI. Normal RV size & fxn, ? Dilated IVC c/w CVP ~ 15 mHg.   TRANSTHORACIC ECHOCARDIOGRAM  04/12/2021   EF estimated 65%.  Normal LV size and function.  No RWMA.  Mild LVH.  GRII DD.  Elevated LVEDP (c/w HFpEF).  Normal RV size.  Mildly elevated PAP estimated 36 mmHg.  Mild MR.  Mild AS (mean gradient 26 mmHg.  DI 0.32.  Aortic root 40 mm, ascending aorta 41 mm.   URETEROSCOPIC LASER LITHOTRIPSY STONE EXTRACTIONS/  STENT PLACEMENT Bilateral 09-10-2007   dr Jeffie Pollock  [facility]   per pt has had several prior ureteroscopic  stone extraction since age 29 , last one 09-10-2007   Zio patch monitor:  02/2021   Probably Sinus Rhythm: Heart rate 56-146 bpm, average 83 bpm.  Frequent PACs noted with rare isolated PVCs.  Multiple episodes of PAT/atrial runs-longest 23 seconds average heart rate 95 bpm, fastest 5 beats at a max rate of 231 bpm.  No evidence of A-fib or atrial flutter.  No prolonged or sustained SVT/PAT.    Allergies  Allergies  Allergen Reactions   Hydrocodone Shortness Of Breath  In combination with decongestants   Other Palpitations and Other (See Comments)    ALL DECONGESTANTS - CAUSE PALPITATIONS; THROWS HEART RHYTHM OUT OF BALANCE   Oxycodone Shortness Of Breath   Sudafed [Pseudoephedrine] Palpitations   Tramadol Shortness Of Breath and Rash    flushing   Lopid [Gemfibrozil] Other (See Comments)    Dizziness      History of Present Illness    CHANDRA FEGER has a PMH of coronary artery disease, chronic CHF, atypical atrial flutter, hyperlipidemia, moderate aortic stenosis, essential hypertension, paroxysmal atrial fibrillation, ED, obesity, ILD.  He underwent cardiac catheterization 3/18 after presenting with symptoms of dyspnea and was noted to have severe RCA stenosis 95%, P-M LAD 70%, FFR 0.75, and circumflex 60%, OM 3 55%.  He received PCI with DES to his RCA and staged PCI to his LAD.  He underwent subsequent catheterization 7/22 with PCI and DES to his D1.  His RCA stent was patent.  He underwent cardiac catheterization again 3/23.  He was noted to have significant in-stent restenosis at his LAD-D2 bifurcation site.  He underwent PTCA kissing balloon technique and D2.  TIMI-3 flow was reduced in the LAD.  He was seen in follow-up 4/23 postcardiac catheterization and PCI.  He was still noting fatigue and decreased endurance.  He denied dyspnea.  He denied chest pain.  His lower extremity swelling was well controlled.  He denied PND and orthopnea.  He denied palpitations, presyncope and  syncope.  He denied claudication.  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  He was seen in follow-up by Dr. Ellyn Hack on 12/21/2021.  He was presenting for 4-74-monthfollow-up.  He felt well.  He felt he was back to his baseline.  He was going to get married.  He denied further episodes of chest pain and pressure at rest and with exertion.  He denied PND, orthopnea, and his lower extremity swelling was well controlled.  He presents to the clinic today for follow-up evaluation states he has a stomach virus.  He was vomiting last night.  He presented to his PCP and was given Zofran.  He reports that over the last several weeks he has noticed increasing dyspnea with physical activity.  We reviewed his previous cardiac catheterization and cardiac history.  He and his wife expressed understanding.  It appears his symptoms are in part related to his GI virus.  We will request labs from PCP, order cardiac PET/CT for further prognostication, and plan follow-up after testing.  Today he denies chest pain,  lower extremity edema, and palpitations.   Home Medications    Prior to Admission medications   Medication Sig Start Date End Date Taking? Authorizing Provider  acetaminophen (TYLENOL) 650 MG CR tablet Take 1,300 mg by mouth See admin instructions. Take 2 tablets (1300 mg) by mouth scheduled at bedtime & may take an additional dose during the day if needed for pain.    [provider]  Adalimumab 40 MG/0.4ML PSKT Inject 40 mg into the skin every 14 (fourteen) days.     [provider]  albuterol (VENTOLIN HFA) 108 (90 Base) MCG/ACT inhaler Inhale 1-2 puffs into the lungs every 6 (six) hours as needed. 02/01/21   Parrett, TFonnie Mu NP  apixaban (ELIQUIS) 5 MG TABS tablet Take 1 tablet (5 mg total) by mouth 2 (two) times daily. 01/12/21   Fenton, Clint R, PA  Biotin 2500 MCG CAPS Take 5,000 mcg by mouth in the morning.  [provider]  Calcium Carbonate-Vit D-Min  (CALCIUM 1200 PO) Take 1 tablet by mouth every evening.    [provider]  cetirizine (ZYRTEC) 10 MG tablet Take 10 mg by mouth in the morning.    [provider]  clopidogrel (PLAVIX) 75 MG tablet Take 1 tablet (75 mg total) by mouth daily. 10/11/20   Cheryln Manly, NP  dapagliflozin propanediol (FARXIGA) 10 MG TABS tablet TAKE 1 TABLET BY MOUTH ONCE DAILY BEFORE BREAKFAST 12/03/21   Leonie Man, MD  diltiazem (CARDIZEM CD) 180 MG 24 hr capsule Take 1 capsule (180 mg total) by mouth daily. Patient taking differently: Take 180 mg by mouth every evening. 07/25/21   Leonie Man, MD  ferrous sulfate 325 (65 FE) MG tablet Take 325 mg by mouth every evening.    Deberah Pelton, NP  furosemide (LASIX) 20 MG tablet Take 1 tablet (20 mg total) by mouth 2 (two) times a week. (Monday and Thursday). Ok to take additional tablet as needed for weight gain of 2 pounds overnight or 5 pounds in one week. 12/24/21 02/17/23  Leonie Man, MD  gabapentin (NEURONTIN) 300 MG capsule Take 300-600 mg by mouth See admin instructions. Take 1 capsule (300 mg) by mouth in the morning and 2 capsules (600 mg) by mouth in th evening    [provider]  galantamine (RAZADYNE ER) 24 MG 24 hr capsule Take 24 mg by mouth in the morning. 08/30/20   [provider]  memantine (NAMENDA) 5 MG tablet Take 10 mg by mouth at bedtime. 08/30/20   [provider]  metFORMIN (GLUCOPHAGE-XR) 500 MG 24 hr tablet Take 500 mg by mouth at bedtime.    [provider]  metoprolol succinate (TOPROL XL) 25 MG 24 hr tablet Take 1 tablet (25 mg total) by mouth daily. 03/27/21   Loel Dubonnet, NP  nitroGLYCERIN (NITROSTAT) 0.4 MG SL tablet Place 1 tablet (0.4 mg total) under the tongue every 5 (five) minutes as needed. 07/04/21   Cheryln Manly, NP  Polyethyl Glycol-Propyl Glycol (SYSTANE OP) Place 1 drop into both eyes 3 (three) times daily as needed (dry/irritated eyes.).     [provider]  potassium chloride SA (KLOR-CON M) 20 MEQ tablet Take 1 tablet on days you take additional lasix 07/04/21   Reino Bellis B, NP  prazosin (MINIPRESS) 1 MG capsule Take 3 mg by mouth at bedtime. 12/13/20   [provider]  predniSONE (DELTASONE) 5 MG tablet Take 1 tablet by mouth once daily with breakfast 10/02/21   Rigoberto Noel, MD  ranolazine (RANEXA) 500 MG 12 hr tablet Take 1 tablet (500 mg total) by mouth 2 (two) times daily. 07/25/21   Leonie Man, MD  rosuvastatin (CRESTOR) 40 MG tablet Take 1 tablet (40 mg total) by mouth daily. 12/28/21   Leonie Man, MD  tamsulosin (FLOMAX) 0.4 MG CAPS capsule Take 1 capsule by mouth once daily Patient taking differently: Take 0.4 mg by mouth daily as needed (kidney stones). 03/13/21   McKenzie, Candee Furbish, MD    Family History    Family History  Problem Relation Age of Onset   Coronary artery disease Mother    Breast cancer Mother    Colon cancer Mother    Heart disease Maternal Grandmother        MI   Stroke Paternal Grandfather        CVA   Coronary artery disease Paternal Grandfather  Esophageal cancer Neg Hx    Stomach cancer Neg Hx    Rectal cancer Neg Hx    He indicated that his mother is deceased. He indicated that his father is deceased. He indicated that his maternal grandmother is deceased. He indicated that his maternal grandfather is deceased. He indicated that his paternal grandmother is deceased. He indicated that his paternal grandfather is deceased. He indicated that the status of his neg hx is unknown.  Social History    Social History   Socioeconomic History   Marital status: Married    Spouse name: Not on file   Number of children: 1   Years of education: Not on file   Highest education level: Not on file  Occupational History   Occupation: Retired Armed forces operational officer  Tobacco Use   Smoking status: Former    Types: Cigars    Quit date: 09/29/1997    Years since quitting:  24.3   Smokeless tobacco: Former    Types: Snuff, Chew   Tobacco comments:    Form smoker and chew tobacco 01/25/2021  Vaping Use   Vaping Use: Never used  Substance and Sexual Activity   Alcohol use: No   Drug use: No   Sexual activity: Not on file  Other Topics Concern   Not on file  Social History Narrative   Pt lives in [place] with spouse.     Retired.    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       After retiring from [employer].        Had been very active as an Hotel manager"  - now doing his best to "stay out of this mess of politics" -- has declined efforts to get him to run for office.      Now is primary caregiver for his wife Judeen Hammans) - who has metastatic CA & has had CVA x 2.  Pretty much "total care".  Very emotionally taxing.      He has plans to build homes for all of his children & grandchildren on their property - to ensure they all have a place to live - Not transferable   Social Determinants of Health   Financial Resource Strain: Not on file  Food Insecurity: Not on file  Transportation Needs: Not on file  Physical Activity: Not on file  Stress: Not on file  Social Connections: Not on file  Intimate Partner Violence: Not on file     Review of Systems    General:  No chills, fever, night sweats or weight changes.  Cardiovascular:  No chest pain, dyspnea on exertion, edema, orthopnea, palpitations, paroxysmal nocturnal dyspnea. Dermatological: No rash, lesions/masses Respiratory: No cough, dyspnea Urologic: No hematuria, dysuria Abdominal:   No nausea, vomiting, diarrhea, bright red blood per rectum, melena, or hematemesis Neurologic:  No visual changes, wkns, changes in mental status. All other systems reviewed and are otherwise negative except as noted above.  Physical Exam    VS:   BP 130/88   Pulse (!) 59   Ht '6\' 3"'$  (1.905 m)   Wt 213 lb 12.8 oz (97 kg)   SpO2 99%   BMI 26.72 kg/m  , BMI Body mass index is 26.72 kg/m. GEN: Well nourished, well developed, in no acute distress. HEENT: normal. Neck: Supple, no JVD, carotid bruits, or masses. Cardiac: RRR, no murmurs,  rubs, or gallops. No clubbing, cyanosis, edema.  Radials/DP/PT 2+ and equal bilaterally.  Respiratory:  Respirations regular and unlabored, clear to auscultation bilaterally. GI: Soft, nontender, nondistended, BS + x 4. MS: no deformity or atrophy. Skin: warm and dry, no rash. Neuro:  Strength and sensation are intact. Psych: Normal affect.  Accessory Clinical Findings    Recent Labs: 01/31/2021: TSH 1.360 06/20/2021: BNP 62.3 12/21/2021: ALT 15; BUN 11; Creatinine, Ser 1.05; Hemoglobin 13.4; Platelets 202; Potassium 4.2; Sodium 141   Recent Lipid Panel    Component Value Date/Time   CHOL 187 12/21/2021 1146   TRIG 203 (H) 12/21/2021 1146   HDL 60 12/21/2021 1146   CHOLHDL 3.1 12/21/2021 1146   CHOLHDL 9.5 08/17/2009 0444   VLDL UNABLE TO CALCULATE IF TRIGLYCERIDE OVER 400 mg/dL 08/17/2009 0444   LDLCALC 93 12/21/2021 1146         ECG personally reviewed by me today-sinus rhythm with marked sinus arrhythmia 69 bpm- No acute changes  Echocardiogram 04/12/21  IMPRESSIONS     1. Left ventricular ejection fraction, by estimation, is 60 to 65%. Left  ventricular ejection fraction by 3D volume is 57 %. The left ventricle has  normal function. The left ventricle has no regional wall motion  abnormalities. There is mild left  ventricular hypertrophy. Left ventricular diastolic parameters are  consistent with Grade II diastolic dysfunction (pseudonormalization).  Elevated left ventricular end-diastolic pressure. The E/e' is 55.   2. Right ventricular systolic function is normal. The right ventricular  size is normal. There is mildly elevated pulmonary artery systolic  pressure. The  estimated right ventricular systolic pressure is 78.9 mmHg.   3. The mitral valve is abnormal. Mild mitral valve regurgitation.   4. The aortic valve is tricuspid. There is mild calcification of the  aortic valve. Aortic valve regurgitation is mild. Mild aortic valve  stenosis. Aortic regurgitation PHT measures 553 msec. Aortic valve area,  by VTI measures 1.68 cm. Aortic valve mean   gradient measures 26.0 mmHg. DI is 0.32.   5. Aortic dilatation noted. There is mild dilatation of the aortic root,  measuring 40 mm. There is mild dilatation of the ascending aorta,  measuring 41 mm.   Comparison(s): Changes from prior study are noted. 09/29/2020: LVEF >75%,  mild AI.   Cardiac catheterization 07/04/2021    Dist Cx lesion is 45% stenosed.   Mid LAD to Dist LAD overlapping stents have 75% stenosed just distal to2nd Diag with 70% stenosed side branch in 2nd Diag.   RPDA lesion is 60% stenosed.   Previously placed Mid RCA stent (unknown type) is  widely patent.   Prox LAD to Mid LAD stent is widely patent   Mid RCA stent was previously treated.   Balloon angioplasty was performed using a BALLN SAPPHIRE 2.0X12.   Balloon angioplasty was performed using a BALLN [place] EMERGE MR 2.5X8.   Post intervention, there is a 25% residual stenosis.   Post intervention, the side branch was reduced to 40% residual stenosis.   The left ventricular systolic function is normal.   LV end diastolic pressure is moderately elevated.   Hemodynamic findings consistent with mild pulmonary hypertension.   There is moderate aortic valve stenosis.   SUMMARY Stable two-vessel disease with exception of progression of in-stent restenosis disease in the stented portion of the LAD-2nd Diag is back to the LAD and branch with widely patent stent in the RCA. Culprit lesion is likely the LAD -2nd Diag bifurcation lesion which is  in-stent restenosis (25% in the LAD and jailed sidebranch 70% stenosis of the 2nd Diag.  Successful  [place] balloon PTCA of the LAD ISR with semicompliant balloon PTCA kissing balloon into the 2nd Diag side branch -> reducing lesions to 25% LAD, 30% diagonal with TIMI-3 flow preserved. Borderline Pulmonary Hypertension with mean PA pressure of 24 mmHg in the setting of LVEDP of 12 mmHg and PCWP pressure of 28 mmHg. Normal Cardiac Output-Index by Fick: 9.2-4.0. At least moderate aortic stenosis with mean gradient in the Cath Lab at 33 million mercury, higher than echo gradient of 26 mm reviewed. Borderline systolic blood pressures.     Recommendations: With moderately elevated LVEDP, will have him increase his furosemide dose to 40 mg on Monday Wednesday Friday (with supplemental potassium K-Dur 20 mEq) Likely follow-up echocardiogram in 6 months to reassess severity of aortic stenosis. Restart DOAC this evening roughly 1800 -> will discharge on Eliquis and Plavix without aspirin May need to reduce antihypertensive agents due to low blood pressures in the Cath Lab.   Follow-up as scheduled     [name], MD    Diagnostic Dominance: Right  Intervention    Assessment & Plan   1.  Coronary artery disease-no chest pain today.  We reviewed his cardiac history and previous cardiac catheterization.  He expressed understanding.  He reports progressive dyspnea on exertion.  He also has GI virus at this time.  He was at his PCP earlier today who ordered lab work.  Case reviewed with Dr. Ellyn Hack. Continue Plavix, metoprolol, rosuvastatin Heart healthy low-sodium diet-salty 6 given Increase physical activity as tolerated Order cardiac PET/CT Request lab work from PCP  Shared Decision Making/Informed Consent The risks [chest pain, shortness of breath, cardiac arrhythmias, dizziness, blood pressure fluctuations, myocardial infarction, stroke/transient ischemic attack, nausea, vomiting, allergic reaction, radiation exposure, metallic taste sensation and life-threatening complications (estimated  to be 1 in 10,000)], benefits (risk stratification, diagnosing coronary artery disease, treatment guidance) and alternatives of a cardiac PET stress test were discussed in detail with Mr. Bossard and he agrees to proceed.   HFpEF-no increased DOE or activity intolerance.  Echocardiogram 04/12/2021 showed an LVEF of 60-65% G2 DD, mildly elevated pulmonary artery systolic pressure, mild mitral valve regurgitation and mild calcification of the aortic valve.  He was also noted to have a dilated ascending aorta measuring 41 mm. Continue metoprolol, furosemide Heart healthy low-sodium diet-salty 6 given Increase physical activity as tolerated   Atypical atrial flutter-heart rate today 69 bpm.  EKG shows sinus rhythm with marked sinus arrhythmia. Continue apixaban, metoprolol Heart healthy low-sodium diet-salty 6 given Increase physical activity as tolerated  Hyperlipidemia-on statin therapy. Continue rosuvastatin Heart healthy low-sodium continue high-fiber diet Increase physical activity as tolerated   Overweight-weight today 213.8 pounds.  Eating less and losing weight. Continue weight loss Increase physical activity as tolerated  Disposition: Follow-up with Dr. Ellyn Hack or me in after testing.   Jossie Ng. Mamoudou Mulvehill NP-C     01/16/2022, 2:43 PM [address] Office [phone] Fax [phone]  Notice: This dictation was prepared with Dragon dictation along with smaller phrase technology. Any transcriptional errors that result from this process are unintentional and may not be corrected upon review.  I spent 15 minutes examining this patient, reviewing medications, and using patient centered shared decision making involving her cardiac care.  Prior to her visit I spent greater than 20 minutes reviewing her past medical history,  medications, and prior cardiac tests.

## 2022-01-15 NOTE — Telephone Encounter (Signed)
Pt c/o Shortness Of Breath: STAT if SOB developed within the last 24 hours or pt is noticeably SOB on the phone  1. Are you currently SOB (can you hear that pt is SOB on the phone)? A little bit  2. How long have you been experiencing SOB? yesterday  3. Are you SOB when sitting or when up moving around? Mostly when moving around  4. Are you currently experiencing any other symptoms? no   Patient's wife states the patient has been SOB since yesterday. She says this is similar to the last time his stent failed so they are not sure if it has failed again.

## 2022-01-16 ENCOUNTER — Encounter: Payer: Self-pay | Admitting: General Practice

## 2022-01-16 ENCOUNTER — Ambulatory Visit: Payer: Medicare Other | Attending: General Practice | Admitting: General Practice

## 2022-01-16 VITALS — BP 130/88 | HR 59 | Ht 75.0 in | Wt 213.8 lb

## 2022-01-16 DIAGNOSIS — I484 Atypical atrial flutter: Secondary | ICD-10-CM

## 2022-01-16 DIAGNOSIS — E785 Hyperlipidemia, unspecified: Secondary | ICD-10-CM

## 2022-01-16 DIAGNOSIS — E1169 Type 2 diabetes mellitus with other specified complication: Secondary | ICD-10-CM

## 2022-01-16 DIAGNOSIS — I25119 Atherosclerotic heart disease of native coronary artery with unspecified angina pectoris: Secondary | ICD-10-CM

## 2022-01-16 DIAGNOSIS — E663 Overweight: Secondary | ICD-10-CM

## 2022-01-16 DIAGNOSIS — I5032 Chronic diastolic (congestive) heart failure: Secondary | ICD-10-CM | POA: Diagnosis not present

## 2022-01-16 NOTE — Patient Instructions (Signed)
Medication Instructions:  The current medical regimen is effective;  continue present plan and medications as directed. Please refer to the Current Medication list given to you today.  *If you need a refill on your cardiac medications before your next appointment, please call your pharmacy*   Lab Work: DONE AT PCP TODAY  Testing/Procedures: PET CT AT [place] BELOW  Other Instructions [facility] PAPERWORK WAS MAILED, SHOULD RECEIVE ANY DAY  Follow-Up: At [facility], you and your health needs are our priority.  As part of our continuing mission to provide you with exceptional heart care, we have created designated Provider Care Teams.  These Care Teams include your primary Cardiologist (physician) and Advanced Practice Providers (APPs -  Physician Assistants and Nurse Practitioners) who all work together to provide you with the care you need, when you need it.  We recommend signing up for the patient portal called "MyChart".  Sign up information is provided on this After Visit Summary.  MyChart is used to connect with patients for Virtual Visits (Telemedicine).  Patients are able to view lab/test results, encounter notes, upcoming appointments, etc.  Non-urgent messages can be sent to your provider as well.   To learn more about what you can do with MyChart, go to [email or web address].    Your next appointment:   AFTER TESTING   The format for your next appointment:   In Person  Provider:   [name], MD     Important Information About Sugar       How to Prepare for Your Cardiac PET/CT Stress Test:  1. Please do not take these medications before your test:   METOPROLOL SUCCINATE AND YOUR METFORMIN  2. Nothing to eat or drink, except water, 3 hours prior to arrival time.   NO caffeine/decaffeinated products, or chocolate 12 hours prior to arrival.  3. NO perfume, cologne or lotion  4. Total time is 1 to 2 hours; you may want to bring reading material for  the waiting time.  5. Please report to Admitting at the [facility] Entr[facility] 60 minutes early for your test.   [place], [place] [address]  In preparation for your appointment, medication and supplies will be purchased.  Appointment availability is limited, so if you need to cancel or reschedule, please call the Radiology Department at [phone]  24 hours in advance to avoid a cancellation fee of $100.00  What to Expect After you Arrive:  Once you arrive and check in for your appointment, you will be taken to a preparation room within the Radiology Department.  A technologist or Nurse will obtain your medical history, verify that you are correctly prepped for the exam, and explain the procedure.  Afterwards,  an IV will be started in your arm and electrodes will be placed on your skin for EKG monitoring during the stress portion of the exam. Then you will be escorted to the PET/CT scanner.  There, staff will get you positioned on the scanner and obtain a blood pressure and EKG.  During the exam, you will continue to be connected to the EKG and blood pressure machines.  A small, safe amount of a radioactive tracer will be injected in your IV to obtain a series of pictures of your heart along with an injection of a stress agent.    After your Exam:  It is recommended that you eat a meal and drink a caffeinated beverage to counter act any effects of the stress  agent.  Drink plenty of fluids for the remainder of the day and urinate frequently for the first couple of hours after the exam.  Your doctor will inform you of your test results within 7-10 business days.  For questions about your test or how to prepare for your test, please call: Marchia Bond, Cardiac Imaging Nurse Navigator  Gordy Clement, Cardiac Imaging Nurse Navigator Office: [phone]

## 2022-01-18 ENCOUNTER — Telehealth: Payer: Self-pay

## 2022-01-18 NOTE — Telephone Encounter (Signed)
Discuss pt with Coletta Memos, FNP-C, call pt and see what medication pt is taking [place] or Jardiance? Called pt he states that he does not know, please call wife to discuss. Called Wife she states that pt is currently taking [place] but, will be taking Jardiance since the [place] does not stock the [place]. Tried to call pt back, how is he feeling since the last appointment? Is he feeling better and back to normal?

## 2022-03-04 DIAGNOSIS — N200 Calculus of kidney: Secondary | ICD-10-CM | POA: Diagnosis not present

## 2022-03-04 DIAGNOSIS — R3121 Asymptomatic microscopic hematuria: Secondary | ICD-10-CM | POA: Diagnosis not present

## 2022-03-04 DIAGNOSIS — N5201 Erectile dysfunction due to arterial insufficiency: Secondary | ICD-10-CM | POA: Diagnosis not present

## 2022-03-08 ENCOUNTER — Telehealth (HOSPITAL_COMMUNITY): Payer: Self-pay | Admitting: *Deleted

## 2022-03-08 NOTE — Addendum Note (Signed)
Addended by: Deberah Pelton on: 03/08/2022 10:44 AM   Modules accepted: Orders

## 2022-03-08 NOTE — Telephone Encounter (Signed)
Returning patient's wife call regarding upcoming cardiac imaging study; pt verbalizes understanding of appt date/time, parking situation and where to check in, pre-test NPO status, and verified current allergies; name and call back number provided for further questions should they arise  [facility] [phone] office [phone] cell  Patient's wife is aware that the patient is to avoid caffeine for 12 hours prior to his cardiac PET scan.

## 2022-03-12 ENCOUNTER — Ambulatory Visit (HOSPITAL_COMMUNITY)
Admission: RE | Admit: 2022-03-12 | Discharge: 2022-03-12 | Disposition: A | Discharge status: Home / Self Care | Payer: Medicare Other | Source: Ambulatory Visit | Attending: General Practice | Admitting: General Practice

## 2022-03-12 DIAGNOSIS — I25119 Atherosclerotic heart disease of native coronary artery with unspecified angina pectoris: Secondary | ICD-10-CM | POA: Diagnosis not present

## 2022-03-12 HISTORY — PX: OTHER SURGICAL HISTORY: SHX169

## 2022-03-12 LAB — NM PET CT CARDIAC PERFUSION MULTI W/ABSOLUTE BLOODFLOW
LV dias vol: 125 mL (ref 62–150)
MBFR: 1.78
Nuc Rest EF: 48 %
Nuc Stress EF: 54 %
Peak HR: 89 {beats}/min
Rest HR: 66 {beats}/min
Rest MBF: 0.85 ml/g/min
Rest Nuclear Isotope Dose: 25.1 mCi
Rest perfusion cavity size (mL): 125 mL
ST Depression (mm): 0 mm
Stress MBF: 1.51 ml/g/min
Stress Nuclear Isotope Dose: 25.1 mCi
Stress perfusion cavity size (mL): 143 mL
TID: 1.06

## 2022-03-12 MED ORDER — REGADENOSON 0.4 MG/5ML IV SOLN: 0.4000 mg | Freq: Once | INTRAVENOUS | Status: AC

## 2022-03-12 MED ORDER — REGADENOSON 0.4 MG/5ML IV SOLN
INTRAVENOUS | Status: AC
  Administered 2022-03-12: 0.4 mg via INTRAVENOUS
  Filled 2022-03-12: qty 5

## 2022-03-12 MED ORDER — RUBIDIUM RB82 GENERATOR (RUBYFILL)
25.1000 | PACK | Freq: Once | INTRAVENOUS | Status: AC
  Administered 2022-03-12: 25.1 via INTRAVENOUS

## 2022-03-13 ENCOUNTER — Telehealth: Payer: Self-pay | Admitting: Cardiology

## 2022-03-13 NOTE — Telephone Encounter (Signed)
Patient's wife would like a call back to discuss PET scan results once reviewed. Patient's wife states she would also like to schedule an appointment when her call is returned. I informed her that Dr. Allison Quarry next available is in April and offered sooner in January with APP at [facility], but she declined. She states she would prefer to wait for a call back.

## 2022-03-13 NOTE — Telephone Encounter (Signed)
LMTCB

## 2022-03-20 NOTE — Telephone Encounter (Signed)
Spoke with pt, all questions were answered.

## 2022-03-25 ENCOUNTER — Ambulatory Visit (HOSPITAL_COMMUNITY): Admission: RE | Admit: 2022-03-25 | Payer: Medicare Other | Source: Ambulatory Visit

## 2022-04-22 ENCOUNTER — Other Ambulatory Visit (HOSPITAL_BASED_OUTPATIENT_CLINIC_OR_DEPARTMENT_OTHER): Payer: Medicare Other

## 2022-04-22 ENCOUNTER — Encounter (HOSPITAL_COMMUNITY): Payer: Self-pay | Admitting: Cardiology

## 2022-05-09 ENCOUNTER — Ambulatory Visit (INDEPENDENT_AMBULATORY_CARE_PROVIDER_SITE_OTHER): Payer: Medicare Other | Admitting: Pulmonary Disease

## 2022-05-09 ENCOUNTER — Encounter (HOSPITAL_BASED_OUTPATIENT_CLINIC_OR_DEPARTMENT_OTHER): Payer: Self-pay | Admitting: Pulmonary Disease

## 2022-05-09 VITALS — BP 122/62 | HR 74 | Temp 98.2°F | Ht 75.0 in | Wt 213.0 lb

## 2022-05-09 DIAGNOSIS — J849 Interstitial pulmonary disease, unspecified: Secondary | ICD-10-CM | POA: Diagnosis not present

## 2022-05-09 DIAGNOSIS — I5032 Chronic diastolic (congestive) heart failure: Secondary | ICD-10-CM

## 2022-05-09 MED ORDER — PREDNISONE 10 MG PO TABS: ORAL_TABLET | ORAL | 0 refills | Status: AC

## 2022-05-09 MED ORDER — PREDNISONE 10 MG PO TABS: 5.0000 mg | ORAL_TABLET | Freq: Every day | ORAL | 0 refills | Status: DC

## 2022-05-09 NOTE — Patient Instructions (Signed)
X Prednisone 10 mg tabs  Take 2 tabs daily with food x 7ds, then 1 tab daily with food x 7ds then back to 5 mg prednisone daily  # 14  OTC Delsym 5 mL twice daily as needed for cough.  If cough persists for 4 weeks, call us back and we will schedule CT scan.  Please make appointment with your PCP at the [facility]

## 2022-05-09 NOTE — Assessment & Plan Note (Addendum)
ILD appears stable on CT scan, previously attributed to NSIP Oxygen saturation is maintained today  For his chronic cough, I will have him increase prednisone to 20 mg for a couple of weeks and then dial back down to 5 mg.  Could also be postviral, he can try over-the-counter cough suppressant such as Delsym  If his cough persists, we will obtain high-resolution CT chest
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
## 2022-05-09 NOTE — Assessment & Plan Note (Signed)
He does not appear to be fluid overloaded today, weight is at his baseline. He will continue his Lasix twice a week

## 2022-05-16 ENCOUNTER — Encounter (HOSPITAL_COMMUNITY): Payer: Self-pay | Admitting: *Deleted

## 2022-05-22 DIAGNOSIS — H35373 Puckering of macula, bilateral: Secondary | ICD-10-CM | POA: Diagnosis not present

## 2022-05-22 DIAGNOSIS — H43813 Vitreous degeneration, bilateral: Secondary | ICD-10-CM | POA: Diagnosis not present

## 2022-05-22 DIAGNOSIS — H04123 Dry eye syndrome of bilateral lacrimal glands: Secondary | ICD-10-CM | POA: Diagnosis not present

## 2022-05-22 DIAGNOSIS — H5203 Hypermetropia, bilateral: Secondary | ICD-10-CM | POA: Diagnosis not present

## 2022-05-22 DIAGNOSIS — H52203 Unspecified astigmatism, bilateral: Secondary | ICD-10-CM | POA: Diagnosis not present

## 2022-05-22 DIAGNOSIS — H524 Presbyopia: Secondary | ICD-10-CM | POA: Diagnosis not present

## 2022-05-22 DIAGNOSIS — Z7984 Long term (current) use of oral hypoglycemic drugs: Secondary | ICD-10-CM | POA: Diagnosis not present

## 2022-05-22 DIAGNOSIS — E119 Type 2 diabetes mellitus without complications: Secondary | ICD-10-CM | POA: Diagnosis not present

## 2022-05-22 DIAGNOSIS — H40053 Ocular hypertension, bilateral: Secondary | ICD-10-CM | POA: Diagnosis not present

## 2022-05-22 DIAGNOSIS — H40003 Preglaucoma, unspecified, bilateral: Secondary | ICD-10-CM | POA: Diagnosis not present

## 2022-05-22 DIAGNOSIS — H35342 Macular cyst, hole, or pseudohole, left eye: Secondary | ICD-10-CM | POA: Diagnosis not present

## 2022-06-28 ENCOUNTER — Ambulatory Visit: Payer: Medicare Other | Attending: Cardiology | Admitting: Cardiology

## 2022-06-28 ENCOUNTER — Encounter: Payer: Self-pay | Admitting: Cardiology

## 2022-06-28 VITALS — BP 134/86 | HR 87 | Ht 75.0 in | Wt 218.0 lb

## 2022-06-28 DIAGNOSIS — R0609 Other forms of dyspnea: Secondary | ICD-10-CM | POA: Diagnosis not present

## 2022-06-28 DIAGNOSIS — I5032 Chronic diastolic (congestive) heart failure: Secondary | ICD-10-CM | POA: Diagnosis not present

## 2022-06-28 DIAGNOSIS — I2510 Atherosclerotic heart disease of native coronary artery without angina pectoris: Secondary | ICD-10-CM | POA: Insufficient documentation

## 2022-06-28 DIAGNOSIS — D500 Iron deficiency anemia secondary to blood loss (chronic): Secondary | ICD-10-CM | POA: Diagnosis not present

## 2022-06-28 DIAGNOSIS — J849 Interstitial pulmonary disease, unspecified: Secondary | ICD-10-CM | POA: Diagnosis not present

## 2022-06-28 DIAGNOSIS — I480 Paroxysmal atrial fibrillation: Secondary | ICD-10-CM | POA: Insufficient documentation

## 2022-06-28 DIAGNOSIS — I25119 Atherosclerotic heart disease of native coronary artery with unspecified angina pectoris: Secondary | ICD-10-CM | POA: Insufficient documentation

## 2022-06-28 DIAGNOSIS — I10 Essential (primary) hypertension: Secondary | ICD-10-CM | POA: Diagnosis not present

## 2022-06-28 DIAGNOSIS — I350 Nonrheumatic aortic (valve) stenosis: Secondary | ICD-10-CM | POA: Insufficient documentation

## 2022-06-28 DIAGNOSIS — E785 Hyperlipidemia, unspecified: Secondary | ICD-10-CM | POA: Insufficient documentation

## 2022-06-28 DIAGNOSIS — Z9861 Coronary angioplasty status: Secondary | ICD-10-CM | POA: Insufficient documentation

## 2022-06-28 DIAGNOSIS — D6869 Other thrombophilia: Secondary | ICD-10-CM | POA: Insufficient documentation

## 2022-06-28 DIAGNOSIS — E1169 Type 2 diabetes mellitus with other specified complication: Secondary | ICD-10-CM | POA: Insufficient documentation

## 2022-06-28 NOTE — Patient Instructions (Addendum)
Medication Instructions:   Stop taking clopidogrel  75 mg    Do not take Rosuvastatin 40 mg for the next month  - we are looking for improvement  in fatigue and memory   You will be stopping  Metoprolol by  taking 1/2 tablet daily for one week , then  1/2 tablet every other day   then stop taking all together.   *If you need a refill on your cardiac medications before your next appointment, please call your pharmacy*   Lab Work: Not needed     Testing/Procedures:  Not needed  Follow-Up: At [facility], you and your health needs are our priority.  As part of our continuing mission to provide you with exceptional heart care, we have created designated Provider Care Teams.  These Care Teams include your primary Cardiologist (physician) and Advanced Practice Providers (APPs -  Physician Assistants and Nurse Practitioners) who all work together to provide you with the care you need, when you need it.  We recommend signing up for the patient portal called "MyChart".  Sign up information is provided on this After Visit Summary.  MyChart is used to connect with patients for Virtual Visits (Telemedicine).  Patients are able to view lab/test results, encounter notes, upcoming appointments, etc.  Non-urgent messages can be sent to your provider as well.   To learn more about what you can do with MyChart, go to [email or web address].    Your next appointment:   6 month(s)  The format for your next appointment:   In Person  Provider:   [name], MD

## 2022-06-28 NOTE — Progress Notes (Unsigned)
Primary Care Provider: Landry Mellow, MD [place] Cardiologist: [name], MD Electrophysiologist: None  Clinic Note: No chief complaint on file.  ===================================  ASSESSMENT/PLAN   Problem List Items Addressed This Visit       Cardiology Problems   Paroxysmal atrial fibrillation (HCC) (Chronic)   Moderate aortic stenosis by prior echocardiogram (Chronic)   Hyperlipidemia associated with type 2 diabetes mellitus ([place]) (Chronic)   Hypercoagulable state due to paroxysmal atrial fibrillation (HCC); CHA2DS2-VASc score 5 (Chronic)   Essential hypertension (Chronic)   Coronary artery disease involving native coronary artery with angina pectoris (HCC) (Chronic)   Chronic heart failure with preserved ejection fraction (HFpEF) (HCC) (Chronic)   CAD S/P percutaneous coronary angioplasty - Primary (Chronic)     Other   ILD (interstitial lung disease) ([place]) (Chronic)   Exertional dyspnea - Class III Angina (Chronic)    ===================================  HPI:    Kenneth [name] is a 77 y.o. male with a PMH below who presents today for 6 month f/u & review of Stress PET at the request of Landry Mellow, MD.  Kenneth [name] was last seen on *** He was seen in follow-up by Dr. Ellyn Hack on 12/21/2021.  He was presenting for 4-22-month follow-up.  He felt well.  He felt he was back to his baseline.  He was going to get married.  He denied further episodes of chest pain and pressure at rest and with exertion.  He denied PND, orthopnea, and his lower extremity swelling was well controlled.   He presents to the clinic today for follow-up evaluation states he has a stomach virus.  He was vomiting last night.  He presented to his PCP and was given Zofran.  He reports that over the last several weeks he has noticed increasing dyspnea with physical activity.  We reviewed his previous cardiac catheterization and cardiac history.  He and his wife expressed  understanding.  It appears his symptoms are in part related to his GI virus.  We will request labs from PCP, order cardiac PET/CT for further prognostication, and plan follow-up after testing.   Was seen by Odie Sera, NP-C on 01/16/2022: Stress PET ordreed  Recent Hospitalizations: ***  Reviewed  CV studies:    The following studies were reviewed today: (if available, images/films reviewed: From Epic Chart or Care Everywhere) Stress PET 03/12/2022: Normal perfusion study/low risk.  Normal LVEF and normal LVEF preserved.  Suspect reduced myocardial blood flow reserve related to microvascular disease.  No evidence of infarction or ischemia.  Resting EF 48%.  Stress EF 54%.  Mildly enlarged LV. Asc Ao 4.1 cm--stable.  Also stable mosaic attenuation in the lungs no change.  Interval History:   Kenneth [name]   CV Review of Symptoms (Summary):  {roscv:[id number]}  REVIEWED OF SYSTEMS   ROS  I have reviewed and (if needed) personally updated the patient's problem list, medications, allergies, past medical and surgical history, social and family history.   PAST MEDICAL HISTORY   Past Medical History:  Diagnosis Date   Arthritis    CAD S/P percutaneous coronary angioplasty 06/28/2016   Cardiologist- Dr. Ellyn Hack: 06/28/16 -- [facility] PCI (Synergy DES 2.75 x 20 mm); 07/10/16: Staged p-m LAD PCI (FFR 0.75) - DES PCI (Synergy DES 3.5 x 20 mm); 10/11/2020: mid-distal LAD 70% (FFR 0.75) DES PCI (Synergy DES 2.5 mm x 32 mm - overlaps prox-mid stent, tapered post-dilation 3.7 - 2.6 mm.;  06/2021 - Cath with severe LAD-D2 bifurcation ISR -- PTCA /  kissing balloon - reduced LAD to ~25% & D2 to 40%   Chronic diastolic heart failure ([place])    (Cardiologist: Dr. Ellyn Hack): EF > 75% / Hyperdynamic LV Fxn - indeterminate Diastolic Fxn.   Chronic dryness of both eyes    Chronic heart failure with preserved ejection fraction (HFpEF) ([place]) 05/22/2013   Diverticulosis of colon    Epiretinal membrane    "epiretinal  attachment" (06/28/2016) 10-14-2017 per pt oringinally in both , now only unilateral    GERD (gastroesophageal reflux disease)    Hiatal hernia    History of adenomatous polyp of colon    History of concussion 08/23/2017   w/o loc--- per pt no residual   History of kidney stones    History of rheumatic fever 1958   NO Evidence of Vavlular disease -> only mild Aortic Sclerosis   History of skin cancer    excision leg;  froze the left arm--- unsure BCC or SCC    Hyperlipidemia    Hypertension    ILD (interstitial lung disease) ([place])    pulmologist-  dr Elsworth Soho--  secondary to methotrexate use --- last PFTs 04-25-2015  mild restriction   Iron deficiency anemia    Mild obstructive sleep apnea    10-04-2019 per pt  had a sleep study years ago , was told no cpap recommended pt denies    Nephrolithiasis    CT 09-07-2019 bilateral nonobstructive stones   Paroxysmal atrial fibrillation [facility]) cardiologist-  dr Romie Keeble/  EP-- dr Rayann Heman   a. 11/2003 Tikosyn initiated - subsequently d/c'd;  b. 2006 RFCA for Afib @ [place];  c. 09/2011 Echo: EF 60-65%, Gr 2 DD;  d. DCCV 10/2011 , 04/2012, 12/ 2014, & 02/ 2015;  e. RFCA 01-13-2014   Plaque psoriasis 1969   followed by rheumatologist  and dermatologist @ [place] in [place]   PONV (postoperative nausea and vomiting)    Right ureteral stone    S/P drug eluting coronary stent placement    06-28-2016  x1 to mRCA;  07-10-2016 x1 to mLAD   Seasonal allergies    Short-term memory loss    Type 2 diabetes mellitus ([place])    followed by pcp   Ureteral calculus, right    Wears hearing aid in both ears     PAST SURGICAL HISTORY   Past Surgical History:  Procedure Laterality Date   [place] N/A 01/13/2014   repeat PVI, also left atrial ablation performed with successfull ablation of LA flutter by Dr Rayann Heman   ATRIAL FIBRILLATION ABLATION  08/2004   Dr Rolland Porter at [facility] , [place])   [place] N/A 05/22/2013    Procedure: CARDIOVERSION;  Surgeon: [name], MD;  Location: [place];  Service: Cardiovascular;  Laterality: N/A;   CARDIOVERSION N/A 10/23/2011   Procedure: CARDIOVERSION;  Surgeon: Deboraha Sprang, MD;  Location: [facility] CATH LAB;  Service: Cardiovascular;  Laterality: N/A;   CARDIOVERSION N/A 05/01/2012   Procedure: CARDIOVERSION;  Surgeon: Deboraha Sprang, MD;  Location: [facility] CATH LAB;  Service: Cardiovascular;  Laterality: N/A;   CARPAL TUNNEL RELEASE Right 1980s   CATARACT EXTRACTION W/ INTRAOCULAR LENS  IMPLANT, BILATERAL  2012  approx.   CORONARY BALLOON ANGIOPLASTY N/A 07/04/2021   Procedure: CORONARY BALLOON ANGIOPLASTY;  Surgeon: Leonie Man, MD;  Location: [place] CV LAB;  Service: Cardiovascular;; scoring balloon and an [place] balloon PTCA of LAD; with kissing balloon PTCA of LAD-D2 bifurcation stented segment reducing LAD to 20 %  ISR and ostial D2 to 40%.   CORONARY STENT INTERVENTION N/A 06/28/2016   Procedure: Coronary Stent Intervention;  Surgeon: Leonie Man, MD;  Location: [place] CV LAB;  Service: Cardiovascular;  Laterality: mRCA PCI -Synergy DES 2.75 m x 20 mm (3.1 mm)   CORONARY STENT INTERVENTION N/A 07/10/2016   Procedure: Coronary Stent Intervention;  Surgeon: Leonie Man, MD;  Location: [place] CV LAB;  Service: Cardiovascular: mLAD PCI Synergy DES 3.5 mm x 20 mm)   CORONARY STENT INTERVENTION N/A 10/11/2020   Procedure: CORONARY STENT INTERVENTION;  Surgeon: Leonie Man, MD;  Location: [place] CV LAB;  Service: Cardiovascular: mid-distal LAD ~70% (FFR 0.75): SYNERGY DES 2.5 MM X 32 MM (overlaps prox-mid Stent from 07/2016) - Tapered post-dilation 3.7 mm ->2.6 mm; Jailed D2 PTCA reduced 60% to 40%.   CYSTOSCOPY WITH RETROGRADE PYELOGRAM, URETEROSCOPY AND STENT PLACEMENT Right 10/16/2017   Procedure: CYSTOSCOPY WITH RIGHT RETROGRADE RIGHT URETEROSCOPY AND STONE BASKET EXTRACTION;  Surgeon: Irine Seal, MD;  Location: [facility];   Service: Urology;  Laterality: Right;   CYSTOSCOPY WITH RETROGRADE PYELOGRAM, URETEROSCOPY AND STENT PLACEMENT Right 10/08/2019   Procedure: CYSTOSCOPY WITH URETEROSCOPY; RETROGRADE PYELOGRAM; STONE BASKET EXTRACTION;  STENT PLACEMENT;  Surgeon: Cleon Gustin, MD;  Location: [facility];  Service: Urology;  Laterality: Right;  1 HR   CYSTOSCOPY WITH RETROGRADE PYELOGRAM, URETEROSCOPY AND STENT PLACEMENT Right 03/28/2020   Procedure: CYSTOSCOPY WITH RIGHT RETROGRADE PYELOGRAM, URETEROSCOPY WITH HOLMIUM LASER AND STENT PLACEMENT;  Surgeon: Irine Seal, MD;  Location: [facility];  Service: Urology;  Laterality: Right;   CYSTOSCOPY WITH RETROGRADE PYELOGRAM, URETEROSCOPY AND STENT PLACEMENT Left 10/22/2021   Procedure: CYSTOSCOPY WITH LEFT RETROGRADE URETEROSCOPY WIHT HOLMIUM LASERAND STENT PLACEMENT;  Surgeon: Irine Seal, MD;  Location: [facility];  Service: Urology;  Laterality: Left;   CYSTOSCOPY/RETROGRADE/URETEROSCOPY/STONE EXTRACTION WITH BASKET Right 10/27/2017   Procedure: CYSTOSCOPY/RETROGRADE/URETEROSCOPY/STONE EXTRACTION WITH BASKET/URETERAL STENT PLACEMENT and laser;  Surgeon: Irine Seal, MD;  [facility];  Service: Urology;  Laterality: Right;   EXTRACORPOREAL SHOCK WAVE LITHOTRIPSY Left 08/10/2020   Procedure: EXTRACORPOREAL SHOCK WAVE LITHOTRIPSY (ESWL);  Surgeon: Ardis Hughs, MD;  Location: [facility];  Service: Urology;  Laterality: Left;   HOLMIUM LASER APPLICATION Right [id number]   Procedure: POSSIBLE HOLMIUM LASER APPLICATION;  Surgeon: Irine Seal, MD;  [facility]: [facility];  Service: Urology;  Laterality: Right;   HOLMIUM LASER APPLICATION Right [id number]   Procedure: HOLMIUM LASER APPLICATION;  Surgeon: Cleon Gustin, MD;  Location: [facility];  Service: Urology;  Laterality: Right;   INTRAVASCULAR PRESSURE WIRE/FFR STUDY N/A 06/28/2016   Procedure: Intravascular Pressure Wire/FFR Study;  Surgeon: Leonie Man, MD;   Location: [place] CV LAB;  Service: Cardiovascular: FFR of mLAD ~65% lesion = 0.75 post --> Staged PCI   INTRAVASCULAR PRESSURE WIRE/FFR STUDY N/A 10/11/2020   Procedure: INTRAVASCULAR PRESSURE WIRE/FFR STUDY;  Surgeon: Leonie Man, MD;  Location: [place] CV LAB;  Service: Cardiovascular;  Laterality: LAD mid-distal just after D2 --> FFR 0.75 SIGNIFICANT => PCI   LEFT HEART CATH AND CORONARY ANGIOGRAPHY  06/30/2003   Dr. Lia Foyer: NO Significant/Obstructive CAD; Preseved LVEF (for Recurrent Afib)   NASAL SINUS SURGERY     x 2   NM MYOVIEW LTD  02/11/2021   Lexiscan:EF 55-65%.  Normal function.  No ischemia or infarction.  No EKG changes noted.   PROXIMAL INTERPHALANGEAL FUSION (PIP) Left 01-05-2001    dr Berenice Primas  correction clawtoe and extensor tendon lengthening   RIGHT/LEFT HEART CATH AND CORONARY ANGIOGRAPHY N/A 06/28/2016   Procedure: Right/Left Heart Cath and Coronary Angiography;  Surgeon: Leonie Man, MD;  Location: [place] CV LAB;  Service: Cardiovascular: mRCA 99% (TIMI 2), mLAD ~65% (FFR 0.75), OM3 55%. mild Pulm HTN. Mod LVEDP elevation. EF 55-65%.   RIGHT/LEFT HEART CATH AND CORONARY ANGIOGRAPHY N/A 10/11/2020   Procedure: RIGHT/LEFT HEART CATH AND CORONARY ANGIOGRAPHY;  Surgeon: Leonie Man, MD;  Location: [place] [facility];; Patent DES in [place] & p-mLAD. CULPRIT = m-d LAD 70% (just after D1 w/ Ost 60%) FFR 0.75 => DES PCI m-dLAD & PTCA ost D1 (overlapping prior stent distally & crossing D2 w/  Ost D2 post PTCA). RIGHT HEART CATH PRESSURES,  & CO-CI ARE NORMAL. NO PULM HTN   RIGHT/LEFT HEART CATH AND CORONARY ANGIOGRAPHY N/A 07/04/2021   Procedure: RIGHT/LEFT HEART CATH AND CORONARY ANGIOGRAPHY;  Surgeon: Leonie Man, MD;  Location: [place] CV LAB;  Service: Cardiovascular;; moderately elevated LVEDP of 20 mmHg, and PCWP-28 mmHg -> mean PAP of 24 mmHg.  Moderate AS-mean gradient 33 mmHg.  (Similar to Echo 26 mmHg); borderline low BP.  CO-CI 9.2 and 4.0..  Stable CAD W/ progression of ISR of LAD stent at D2 => PCI   ROTATOR CUFF REPAIR Bilateral last one 2014   TONSILLECTOMY  age 41   TRANSTHORACIC ECHOCARDIOGRAM  09/29/2020   EF > 75%, Hyperdynamic. No RWMA. Mild LVH. ~ DD with mild LA dilation. Mild AI. Normal RV size & fxn, ? Dilated IVC c/w CVP ~ 15 mHg.   TRANSTHORACIC ECHOCARDIOGRAM  04/12/2021   EF estimated 65%.  Normal LV size and function.  No RWMA.  Mild LVH.  GRII DD.  Elevated LVEDP (c/w HFpEF).  Normal RV size.  Mildly elevated PAP estimated 36 mmHg.  Mild MR.  Mild AS (mean gradient 26 mmHg.  DI 0.32.  Aortic root 40 mm, ascending aorta 41 mm.   URETEROSCOPIC LASER LITHOTRIPSY STONE EXTRACTIONS/  STENT PLACEMENT Bilateral 09-10-2007   dr Jeffie [facility]   per pt has had several prior ureteroscopic stone extraction since age 39 , last one 09-10-2007   Zio patch monitor:  02/2021   Probably Sinus Rhythm: Heart rate 56-146 bpm, average 83 bpm.  Frequent PACs noted with rare isolated PVCs.  Multiple episodes of PAT/atrial runs-longest 23 seconds average heart rate 95 bpm, fastest 5 beats at a max rate of 231 bpm.  No evidence of A-fib or atrial flutter.  No prolonged or sustained SVT/PAT.   Diagnostic Dominance: Right  Intervention     MEDICATIONS/ALLERGIES   No outpatient medications have been marked as taking for the 06/28/22 encounter (Appointment) with Leonie Man, MD.    Allergies  Allergen Reactions   Hydrocodone Shortness Of Breath    In combination with decongestants   Other Palpitations and Other (See Comments)    ALL DECONGESTANTS - CAUSE PALPITATIONS; THROWS HEART RHYTHM OUT OF BALANCE   Oxycodone Shortness Of Breath   Sudafed [Pseudoephedrine] Palpitations   Tramadol Shortness Of Breath and Rash    flushing   Lopid [Gemfibrozil] Other (See Comments)    Dizziness      SOCIAL HISTORY/FAMILY HISTORY   Reviewed in Epic:  Pertinent findings:  Social History   Tobacco Use   Smoking status: Former     Types: Cigars    Quit date: 09/29/1997    Years since quitting: 24.7   Smokeless tobacco: Former  Types: Snuff, Chew   Tobacco comments:    Form smoker and chew tobacco 01/25/2021  Vaping Use   Vaping Use: Never used  Substance Use Topics   Alcohol use: No   Drug use: No   Social History   Social History Narrative   Pt lives in [place] with spouse.     Retired.    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       After retiring from [employer].        Had been very active as an Hotel manager"  - now doing his best to "stay out of this mess of politics" -- has declined efforts to get him to run for office.      Now is primary caregiver for his wife Judeen Hammans) - who has metastatic CA & has had CVA x 2.  Pretty much "total care".  Very emotionally taxing.      He has plans to build homes for all of his children & grandchildren on their property - to ensure they all have a place to live - Not transferable    OBJCTIVE -PE, EKG, labs   Wt Readings from Last 3 Encounters:  05/09/22 213 lb (96.6 kg)  01/16/22 213 lb 12.8 oz (97 kg)  12/21/21 216 lb 12.8 oz (98.3 kg)    Physical Exam: There were no vitals taken for this visit. Physical Exam   Adult ECG Report  Rate: *** ;  Rhythm: {rhythm:17366};   Narrative Interpretation: ***  Recent Labs:  ***  Lab Results  Component Value Date   CHOL 187 12/21/2021   HDL 60 12/21/2021   LDLCALC 93 12/21/2021   TRIG 203 (H) 12/21/2021   CHOLHDL 3.1 12/21/2021   Lab Results  Component Value Date   CREATININE 1.05 12/21/2021   BUN 11 12/21/2021   NA 141 12/21/2021   K 4.2 12/21/2021   CL 101 12/21/2021   CO2 24 12/21/2021      Latest Ref Rng & Units 12/21/2021   11:46 AM 10/22/2021    3:30 PM 07/04/2021   10:54 AM  CBC  WBC 3.4 - 10.8 x10E3/uL 6.3  6.9    Hemoglobin  13.0 - 17.7 g/dL 13.4  11.5  10.9    10.9   Hematocrit 37.5 - 51.0 % 40.4  35.4  32.0    32.0   Platelets 150 - 450 x10E3/uL 202  200      Lab Results  Component Value Date   HGBA1C 6.3 (H) 10/22/2021   Lab Results  Component Value Date   TSH 1.360 01/31/2021    ================================================== I spent a total of ***minutes with the patient spent in direct patient consultation.  Additional time spent with chart review  / charting (studies, outside notes, etc): *** min Total Time: *** min  Current medicines are reviewed at length with the patient today.  (+/- concerns) ***  Notice: This dictation was prepared with Dragon dictation along with smart phrase technology. Any transcriptional errors that result from this process are unintentional and may not be corrected upon review.  Studies Ordered:   No orders of the defined types were placed in this encounter.  No orders of the defined types were placed in this encounter.   Patient Instructions / Medication Changes & Studies & Tests Ordered   There are no Patient Instructions on  file for this visit.     Leonie Man, MD, MS [name], M.D., M.S. Interventional Cardiologist  [place]  Pager # [phone] Phone # [phone] [address] [place], [place] [address]   Thank you for choosing [facility]!!

## 2022-06-29 ENCOUNTER — Encounter: Payer: Self-pay | Admitting: Cardiology

## 2022-06-29 NOTE — Assessment & Plan Note (Signed)
Stable blood count hemoglobin of 13.5 on last check.

## 2022-06-29 NOTE — Assessment & Plan Note (Signed)
Chronic HFpEF with some component of microvascular disease suggested by stress PET.  Seems to be euvolemic although his weight is up a little bit.  Not having PND orthopnea.  Still taking the furosemide to 3 times a week.  I am a little bit concerned about his level of fatigue and dyspnea thinking that maybe the beta-blocker could be affecting this.  He is on diltiazem which helps microvascular disease and is acceptable with normal EF has not had anginal agent.  Plan: Wean off of Toprol and continue 180 mg diltiazem Continue Jardiance which is a diabetes dose along with twice a week Lasix with PRN dosing.

## 2022-06-29 NOTE — Assessment & Plan Note (Signed)
Does not seem to be That much in the way of breakthrough spells.  Continue diltiazem for rate control.  Blood pruritic regulation.

## 2022-06-29 NOTE — Assessment & Plan Note (Addendum)
Not likely to be related to macrovascular disease related angina but could be related to microvascular diastolic dysfunction as well as ILD.  Continue current dose of Lasix to avoid volume overload but I suspect that pulmonary medicine will need to be aggressively involved. The absence of microvascular ischemia on PET would suggest that we do not need to go back to the Cath Lab.  Using calcium channel blocker and Ranexa for microvascular antianginal benefit.

## 2022-06-29 NOTE — Assessment & Plan Note (Signed)
Definitely has a murmur present, but his most recent echo only showed mild aortic stenosis.  He had an echo ordered but not yet done.  I think we can probably wait at least another year for recheck.

## 2022-06-29 NOTE — Assessment & Plan Note (Signed)
Overall doing pretty well.  There is concern for microvascular ischemia seen on stress PET but no macrovascular ischemia.  This is very reassuring.  Plan: wean off and then stop his beta-blocker to allow for more blood pressure and more energy level. He is appropriate on a calcium channel blocker along with Ranexa. (There is possibility that Ranexa could be leading to his unstable gait, but with his chest pain being improved, I am reluctant to hold it for now) DC Plavix Continue Jardiance and  Lipids look better with higher dose of statin, however with him having such weakness and fatigue (as well as memory issues) I think we will get a give a trial again of a 1 month statin holiday -> if no change in symptoms after 1 month then restart dose.

## 2022-06-29 NOTE — Assessment & Plan Note (Signed)
Blood pressure looks okay today.  Last time we reduce his diltiazem and continue Toprol, but I am little worried about his energy level and would like to been having maybe little more blood pressure.  Would indicate stop his Toprol but weaning it off and discontinue current dose of diltiazem. May need to use ARB for afterload reduction if pressures go up, target blood pressure for him should be in the 130/80 to 150/90 range to avoid orthostatic symptoms.  Is on intermittent dose of Lasix as noted in CHF section.

## 2022-06-29 NOTE — Assessment & Plan Note (Signed)
His lipids looked elevated back in September.  We did have him increase his Crestor dose but now with having weakness and fatigue etc., will get 1 month statin holiday.  Should be due for labs to be rechecked in the next 3 to 4 months.  Is on metformin and Jardiance.  Switch from [place] to [place]

## 2022-06-29 NOTE — Assessment & Plan Note (Signed)
Notably Strader stenosis in the LAD stent.  Was on Eliquis and Plavix.  Now 1 year out.  At this point I think we can stop Plavix and continue simply on Eliquis monotherapy.  There is concerned about potential falls.  Would like for her to be off of dual therapy.  Okay to hold Eliquis for procedures or surgeries-2 to 3 days depending on the procedure.

## 2022-06-29 NOTE — Assessment & Plan Note (Signed)
With proximal atrial fibrillation/flutter, he is now on Eliquis and still on Plavix post PCI.  Far enough out now for his PCI but I think we can go to just Eliquis alone.  Chads Vascor is at least 5 given CHF, HTN, DM, vascular disease, age x 2.  He also now is having worsening memory issues and weakness.  Concern about possible falls.  If he does start to have falls I think we may need to reconsider DOAC.

## 2022-08-06 DIAGNOSIS — H40053 Ocular hypertension, bilateral: Secondary | ICD-10-CM | POA: Diagnosis not present

## 2022-08-06 DIAGNOSIS — H401131 Primary open-angle glaucoma, bilateral, mild stage: Secondary | ICD-10-CM | POA: Diagnosis not present

## 2022-08-07 DIAGNOSIS — N200 Calculus of kidney: Secondary | ICD-10-CM | POA: Diagnosis not present

## 2022-08-07 DIAGNOSIS — R350 Frequency of micturition: Secondary | ICD-10-CM | POA: Diagnosis not present

## 2022-08-07 DIAGNOSIS — N5201 Erectile dysfunction due to arterial insufficiency: Secondary | ICD-10-CM | POA: Diagnosis not present

## 2022-08-07 DIAGNOSIS — N401 Enlarged prostate with lower urinary tract symptoms: Secondary | ICD-10-CM | POA: Diagnosis not present

## 2022-08-28 ENCOUNTER — Other Ambulatory Visit: Payer: Self-pay | Admitting: Cardiology

## 2022-08-28 ENCOUNTER — Other Ambulatory Visit: Payer: Self-pay | Admitting: Pulmonary Disease

## 2022-08-28 DIAGNOSIS — J849 Interstitial pulmonary disease, unspecified: Secondary | ICD-10-CM

## 2022-10-09 DIAGNOSIS — H401131 Primary open-angle glaucoma, bilateral, mild stage: Secondary | ICD-10-CM | POA: Diagnosis not present

## 2022-10-09 DIAGNOSIS — H40053 Ocular hypertension, bilateral: Secondary | ICD-10-CM | POA: Diagnosis not present

## 2022-11-07 DIAGNOSIS — M47812 Spondylosis without myelopathy or radiculopathy, cervical region: Secondary | ICD-10-CM | POA: Diagnosis not present

## 2022-11-07 DIAGNOSIS — M25511 Pain in right shoulder: Secondary | ICD-10-CM | POA: Diagnosis not present

## 2022-11-07 DIAGNOSIS — M25811 Other specified joint disorders, right shoulder: Secondary | ICD-10-CM | POA: Diagnosis not present

## 2022-11-11 ENCOUNTER — Other Ambulatory Visit: Payer: Self-pay | Admitting: Cardiology

## 2022-11-11 ENCOUNTER — Telehealth: Payer: Self-pay | Admitting: Cardiology

## 2022-11-11 MED ORDER — RANOLAZINE ER 500 MG PO TB12: 500.0000 mg | ORAL_TABLET | Freq: Two times a day (BID) | ORAL | 2 refills | Status: DC

## 2022-11-11 MED ORDER — DILTIAZEM HCL ER COATED BEADS 180 MG PO CP24: 180.0000 mg | ORAL_CAPSULE | Freq: Every day | ORAL | 2 refills | Status: DC

## 2022-11-11 NOTE — Telephone Encounter (Signed)
*  STAT* If patient is at the pharmacy, call can be transferred to refill team.   1. Which medications need to be refilled? (please list name of each medication and dose if known) diltiazem (CARDIZEM CD) 180 MG 24 hr capsule   ranolazine (RANEXA) 500 MG 12 hr tablet   2. Which pharmacy/location (including street and city if local pharmacy) is medication to be sent to? [facility] - [place], [place] - [address]   3. Do they need a 30 day or 90 day supply? 90

## 2022-11-11 NOTE — Telephone Encounter (Signed)
Pt's medication was sent to pt's pharmacy as requested. Confirmation received.  °

## 2022-11-28 DIAGNOSIS — K5730 Diverticulosis of large intestine without perforation or abscess without bleeding: Secondary | ICD-10-CM | POA: Diagnosis not present

## 2022-11-28 DIAGNOSIS — R310 Gross hematuria: Secondary | ICD-10-CM | POA: Diagnosis not present

## 2022-11-28 DIAGNOSIS — K429 Umbilical hernia without obstruction or gangrene: Secondary | ICD-10-CM | POA: Diagnosis not present

## 2022-11-28 DIAGNOSIS — N23 Unspecified renal colic: Secondary | ICD-10-CM | POA: Diagnosis not present

## 2022-11-28 DIAGNOSIS — N201 Calculus of ureter: Secondary | ICD-10-CM | POA: Diagnosis not present

## 2022-12-10 DIAGNOSIS — N201 Calculus of ureter: Secondary | ICD-10-CM | POA: Diagnosis not present

## 2022-12-11 ENCOUNTER — Telehealth: Payer: Self-pay | Admitting: *Deleted

## 2022-12-11 ENCOUNTER — Telehealth: Payer: Self-pay | Admitting: Cardiology

## 2022-12-11 ENCOUNTER — Other Ambulatory Visit: Payer: Self-pay | Admitting: Urology

## 2022-12-11 NOTE — Telephone Encounter (Signed)
   Pre-operative Risk Assessment    Patient Name: Kenneth [name]  DOB: December 15, 1945 MRN: [id number]      Request for Surgical Clearance    Procedure:   Lithotripsy  Date of Surgery:  Clearance 12/16/22                                 Surgeon: Dr. Bjorn Pippin Surgeon's Group or Practice Name: [facility] Phone number: [phone] Fax number: [phone]   Type of Clearance Requested:   - Medical  - Pharmacy:  Hold Apixaban (Eliquis) 2 days prior   Type of Anesthesia:  Local    Additional requests/questions:    [name]   12/11/2022, 10:56 AM

## 2022-12-11 NOTE — Telephone Encounter (Signed)
   Name: Kenneth [name]  DOB: 20-Aug-1945  MRN: [id number]  Primary Cardiologist: Bryan Lemma, MD   Preoperative team, please contact this patient and set up a phone call appointment for further preoperative risk assessment. Please obtain consent and complete medication review. Thank you for your help.  I confirm that guidance regarding antiplatelet and oral anticoagulation therapy has been completed and, if necessary, noted below.  Per Dr. Herbie Baltimore, ok to hold Eliquis for 2 days prior to procedure. Please resume Eliquis as soon as possible postprocedure, at the discretion of the surgeon.     Joylene Grapes, NP 12/11/2022, 11:13 AM [facility]

## 2022-12-11 NOTE — Telephone Encounter (Signed)
Operator had sent me a secure chat saying pt's wife is calling back. I then returned her call. She just wanted to confirm the information for appt 12/13/22 for the pt. She has been given tele appt 12/13/22 @ 3 pm with Joni Reining, DNP.

## 2022-12-11 NOTE — Telephone Encounter (Signed)
Ok per [name], NP to add pt to 12/13/22 tele appt due to med hold and proc date. Med rec and consent are done.     Patient Consent for Virtual Visit        [name] has provided verbal consent on 12/11/2022 for a virtual visit (video or telephone).   CONSENT FOR VIRTUAL VISIT FOR:  Kenneth [name]  By participating in this virtual visit I agree to the following:  I hereby voluntarily request, consent and authorize [facility] and its employed or contracted physicians, physician assistants, nurse practitioners or other licensed health care professionals (the Practitioner), to provide me with telemedicine health care services (the "Services") as deemed necessary by the treating Practitioner. I acknowledge and consent to receive the Services by the Practitioner via telemedicine. I understand that the telemedicine visit will involve communicating with the Practitioner through live audiovisual communication technology and the disclosure of certain medical information by electronic transmission. I acknowledge that I have been given the opportunity to request an in-person assessment or other available alternative prior to the telemedicine visit and am voluntarily participating in the telemedicine visit.  I understand that I have the right to withhold or withdraw my consent to the use of telemedicine in the course of my care at any time, without affecting my right to future care or treatment, and that the Practitioner or I may terminate the telemedicine visit at any time. I understand that I have the right to inspect all information obtained and/or recorded in the course of the telemedicine visit and may receive copies of available information for a reasonable fee.  I understand that some of the potential risks of receiving the Services via telemedicine include:  Delay or interruption in medical evaluation due to technological equipment failure or disruption; Information transmitted may  not be sufficient (e.g. poor resolution of images) to allow for appropriate medical decision making by the Practitioner; and/or  In rare instances, security protocols could fail, causing a breach of personal health information.  Furthermore, I acknowledge that it is my responsibility to provide information about my medical history, conditions and care that is complete and accurate to the best of my ability. I acknowledge that Practitioner's advice, recommendations, and/or decision may be based on factors not within their control, such as incomplete or inaccurate data provided by me or distortions of diagnostic images or specimens that may result from electronic transmissions. I understand that the practice of medicine is not an exact science and that Practitioner makes no warranties or guarantees regarding treatment outcomes. I acknowledge that a copy of this consent can be made available to me via my patient portal [facility] MyChart), or I can request a printed copy by calling the office of [facility].    I understand that my insurance will be billed for this visit.   I have read or had this consent read to me. I understand the contents of this consent, which adequately explains the benefits and risks of the Services being provided via telemedicine.  I have been provided ample opportunity to ask questions regarding this consent and the Services and have had my questions answered to my satisfaction. I give my informed consent for the services to be provided through the use of telemedicine in my medical care

## 2022-12-11 NOTE — Telephone Encounter (Signed)
Ok per [name], NP to add pt to 12/13/22 tele appt due to med hold and proc date. Med rec and consent are done.

## 2022-12-13 ENCOUNTER — Ambulatory Visit: Payer: Medicare Other | Attending: Cardiology

## 2022-12-13 DIAGNOSIS — Z01810 Encounter for preprocedural cardiovascular examination: Secondary | ICD-10-CM

## 2022-12-13 DIAGNOSIS — Z01818 Encounter for other preprocedural examination: Secondary | ICD-10-CM

## 2022-12-13 NOTE — Progress Notes (Signed)
Left voicemail for patient to return call to [phone] and to stop eliquis today and hold lasix on Monday.

## 2022-12-13 NOTE — Progress Notes (Signed)
Virtual Visit via Telephone Note   Because of Kenneth [name]'s co-morbid illnesses, he is at least at moderate risk for complications without adequate follow up.  This format is felt to be most appropriate for this patient at this time.  The patient did not have access to video technology/had technical difficulties with video requiring transitioning to audio format only (telephone).  All issues noted in this document were discussed and addressed.  No physical exam could be performed with this format.  Please refer to the patient's chart for his consent to telehealth for Kenneth [name].  Evaluation Performed:  Preoperative cardiovascular risk assessment _____________   Date:  12/13/2022   Patient ID:  Kenneth [name], DOB 26-Nov-1945, MRN [id number] Patient Location:  Home Provider location:   Office  Primary Care Provider:  Anson Fret, MD Primary Cardiologist:  Bryan Lemma, MD  Chief Complaint / Patient Profile   77 y.o. y/o male with a h/o paroxysmal atrial fibrillation, moderate aortic stenosis, hyperlipidemia with type 2 diabetes, hypercoagulable state due to paroxysmal atrial fibrillation with CHA2DS2-VASc score of 5 on Eliquis, hypertension, coronary artery disease with microvascular ischemia chronic diastolic heart failure, status post PCI of the LAD, stopped Plavix on last office visit.  He is pending lithotripsy with Dr. Bjorn Pippin with [facility] on 12/16/2022 and presents today for telephonic preoperative cardiovascular risk assessment and request concerning holding apixaban for 2 days prior to procedure 06/28/2022.  History of Present Illness    Kenneth [name] is a 77 y.o. male who presents via audio/video conferencing for a telehealth visit today.  Pt was last seen in cardiology clinic on 06/28/2022 by Dr. Bryan Lemma.  At that time Kenneth [name] was doing well Plavix was discontinued.  He is being weaned off of beta-blocker.  The patient is now pending  procedure as outlined above. Since his last visit, he has been doing well.  He has had a couple of episodes where he felt dizzy with position changes.  He is no longer taking diltiazem per Dr. Herbie Baltimore.  He denies any chest pain, he has chronic dyspnea from lung scarring due to methotrexate and psoriasis, he denies any significant bleeding, hemoptysis or epistaxis on Eliquis.  His wife helps him with his medications and is also helped with this virtual visit due to hearing loss.  She states that he is very active and works out on their land, works in the yard, denying any inadequacies due to his heart function.  Past Medical History    Past Medical History:  Diagnosis Date   Arthritis    CAD S/P percutaneous coronary angioplasty 06/28/2016   Cardiologist- Dr. Herbie Baltimore: 06/28/16 -- [facility] PCI (Synergy DES 2.75 x 20 mm); 07/10/16: Staged p-m LAD PCI (FFR 0.75) - DES PCI (Synergy DES 3.5 x 20 mm); 10/11/2020: mid-distal LAD 70% (FFR 0.75) DES PCI (Synergy DES 2.5 mm x 32 mm - overlaps prox-mid stent, tapered post-dilation 3.7 - 2.6 mm.;  06/2021 - Cath with severe LAD-D2 bifurcation ISR -- PTCA / kissing balloon - reduced LAD to ~25% & D2 to 40%   Chronic diastolic heart failure (HCC)    (Cardiologist: Dr. Herbie Baltimore): EF > 75% / Hyperdynamic LV Fxn - indeterminate Diastolic Fxn.   Chronic dryness of both eyes    Chronic heart failure with preserved ejection fraction (HFpEF) (HCC) 05/22/2013   Diverticulosis of colon    Epiretinal membrane    "epiretinal attachment" (06/28/2016) 10-14-2017 per pt oringinally in both , now  only unilateral    GERD (gastroesophageal reflux disease)    Hiatal hernia    History of adenomatous polyp of colon    History of concussion 08/23/2017   w/o loc--- per pt no residual   History of kidney stones    History of rheumatic fever 1958   NO Evidence of Vavlular disease -> only mild Aortic Sclerosis   History of skin cancer    excision leg;  froze the left arm--- unsure BCC or SCC     Hyperlipidemia    Hypertension    ILD (interstitial lung disease) (HCC)    pulmologist-  dr Vassie Loll--  secondary to methotrexate use --- last PFTs 04-25-2015  mild restriction   Iron deficiency anemia    Mild obstructive sleep apnea    10-04-2019 per pt  had a sleep study years ago , was told no cpap recommended pt denies    Nephrolithiasis    CT 09-07-2019 bilateral nonobstructive stones   Paroxysmal atrial fibrillation (HCC) 2015   a. 11/2003 Tikosyn initiated - subsequently d/c'd;  b. 2006 RFCA for Afib @ [facility];  c. 09/2011 Echo: EF 60-65%, Gr 2 DD;  d. DCCV 10/2011 , 04/2012, 12/ 2014, & 02/ 2015;  e. RFCA 01-13-2014   Plaque psoriasis 1969   followed by rheumatologist  and [facility] [place]   PONV (postoperative nausea and vomiting)    Right ureteral stone    S/P drug eluting coronary stent placement    06-28-2016  x1 to mRCA;  07-10-2016 x1 to mLAD   Seasonal allergies    Short-term memory loss    Type 2 diabetes mellitus (HCC)    followed by pcp   Ureteral calculus, right    Wears hearing aid in both ears    Past Surgical History:  Procedure Laterality Date   APPENDECTOMY  1996   ATRIAL FIBRILLATION ABLATION N/A 01/13/2014   repeat PVI, also left atrial ablation performed with successfull ablation of LA flutter by Dr Johney Frame   ATRIAL FIBRILLATION ABLATION  08/2004   [facility] Delena [name]   CARDIAC STRESS PET  03/12/2022   Normal perfusion study/low risk.  Normal LVEF and normal LVEF preserved.  Suspect reduced myocardial blood flow reserve related to microvascular disease.  No evidence of infarction or ischemia.  Resting EF 48%.  Stress EF 54%.  Mildly enlarged LV. Asc Ao 4.1 cm--stable.  Also stable mosaic attenuation in the lungs no change.   CARDIOVERSION N/A 05/22/2013   Procedure: CARDIOVERSION;  Surgeon: Hillis Range, MD;  Location: [facility] OR;  Service: Cardiovascular;  Laterality: N/A;   CARDIOVERSION N/A 10/23/2011   Procedure:  CARDIOVERSION;  Surgeon: Duke Salvia, MD;  Location[name] LAB;  Service: Cardiovascular;  Laterality: N/A;   CARDIOVERSION N/A 05/01/2012   Procedure: CARDIOVERSION;  Surgeon: Duke Salvia, MD;  Location: [facility] CATH LAB;  Service: Cardiovascular;  Laterality: N/A;   CARPAL TUNNEL RELEASE Right 1980s   CATARACT EXTRACTION W/ INTRAOCULAR LENS  IMPLANT, BILATERAL  2012  approx.   CORONARY BALLOON ANGIOPLASTY N/A 07/04/2021   Procedure: CORONARY BALLOON ANGIOPLASTY;  Surgeon: Marykay Lex, MD;  Location: [facility] INVASIVE CV LAB;  Service: Cardiovascular;; scoring balloon and an [place] balloon PTCA of LAD; with kissing balloon PTCA of LAD-D2 bifurcation stented segment reducing LAD to 20 % ISR and ostial D2 to 40%.   CORONARY PRESSURE/FFR STUDY N/A 06/28/2016   Procedure: Intravascular Pressure Wire/FFR Study;  Surgeon: Marykay Lex, MD;  Location: [facility] INVASIVE CV LAB;  Service: Cardiovascular: FFR of mLAD ~65% lesion = 0.75 post --> Staged PCI   CORONARY PRESSURE/FFR STUDY N/A 10/11/2020   Procedure: INTRAVASCULAR PRESSURE WIRE/FFR STUDY;  Surgeon: Marykay Lex, MD;  Location: [facility] INVASIVE CV LAB;  Service: Cardiovascular;  Laterality: LAD mid-distal just after D2 --> FFR 0.75 SIGNIFICANT => PCI   CORONARY STENT INTERVENTION N/A 06/28/2016   Procedure: Coronary Stent Intervention;  Surgeon: Marykay Lex, MD;  Location: [name] INVAS[facility] CV LAB;  Service: Cardiovascular;  Laterality: mRCA PCI -Synergy DES 2.75 m x 20 mm (3.1 mm)   CORONARY STENT INTERVENTION N/A 07/10/2016   Procedure: Coronary Stent Intervention;  Surgeon: Marykay Lex, MD;  Location: [name] INVASIVE CV LAB;  Service: Cardiovascular: mLAD PCI Synergy DES 3.5 mm x 20 mm)   CORONARY STENT INTERVENTION N/A 10/11/2020   Procedure: CORONARY STENT INTERVENTION;  Surgeon: Marykay Lex, MD;  Location: [facility] INVASIVE CV LAB;  Service: Cardiovascular: mid-distal LAD ~70% (FFR 0.75): SYNERGY DES 2.5 MM X 32 MM (overlaps prox-mid Stent from  07/2016) - Tapered post-dilation 3.7 mm ->2.6 mm; Jailed D2 PTCA reduced 60% to 40%.   CYSTOSCOPY WITH RETROGRADE PYELOGRAM, URETEROSCOPY AND STENT PLACEMENT Right 10/16/2017   Procedure: CYSTOSCOPY WITH RIGHT RETROGRADE RIGHT URETEROSCOPY AND STONE BASKET EXTRACTION;  Surgeon: Bjorn Pippin, MD;  Location: [facility];  Service: Urology;  Laterality: Right;   CYSTOSCOPY WITH RETROGRADE PYELOGRAM, URETEROSCOPY AND STENT PLACEMENT Right 10/08/2019   Procedure: CYSTOSCOPY WITH URETEROSCOPY; RETROGRADE PYELOGRAM; STONE BASKET EXTRACTION;  STENT PLACEMENT;  Surgeon: Malen Gauze, MD;  Location: [name];  Service: Urology;  Laterality: Right;  1 HR   CYSTOSCOPY WITH RETROGRADE PYELOGRAM, URETEROSCOPY AND STENT PLACEMENT Right 03/28/2020   Procedure: CYSTOSCOPY WITH RIGHT RETROGRADE PYELOGRAM, URETEROSCOPY WITH HOLMIUM LASER AND STENT PLACEMENT;  Surgeon: Bjorn Pippin, MD;  Location: [facility];  Service: Urology;  Laterality: Right;   CYSTOSCOPY WITH RETROGRADE PYELOGRAM, URETEROSCOPY AND STENT PLACEMENT Left 10/22/2021   Procedure: CYSTOSCOPY WITH LEFT RETROGRADE URETEROSCOPY WIHT HOLMIUM LASERAND STENT PLACEMENT;  Surgeon: Bjorn Pippin, MD;  Location: [facility];  Service: Urology;  Laterality: Left;   CYSTOSCOPY/RETROGRADE/URETEROSCOPY/STONE EXTRACTION WITH BASKET Right 10/27/2017   Procedure: CYSTOSCOPY/RETROGRADE/URETEROSCOPY/STONE EXTRACTION WITH BASKET/URETERAL STENT PLACEMENT and laser;  Surgeon: Bjorn Pippin, MD;  Location: [facility];  Service: Urology;  Laterality: Right;   EXTRACORPOREAL SHOCK WAVE LITHOTRIPSY Left 08/10/2020   Procedure: EXTRACORPOREAL SHOCK WAVE LITHOTRIPSY (ESWL);  Surgeon: Crist Fat, MD;  Location: [facility];  Service: Urology;  Laterality: Left;   HOLMIUM LASER APPLICATION Right 10/27/2017   Procedure: POSSIBLE HOLMIUM LASER APPLICATION;  Surgeon: Bjorn Pippin, MD;  Location: [facility];  Service: Urology;  Laterality: Right;    HOLMIUM LASER APPLICATION Right 10/08/2019   Procedure: HOLMIUM LASER APPLICATION;  Surgeon: Malen Gauze, MD;  Location: [facility];  Service: Urology;  Laterality: Right;   LEFT HEART CATH AND CORONARY ANGIOGRAPHY  06/30/2003   Dr. Riley Kill: NO Significant/Obstructive CAD; Preseved LVEF (for Recurrent Afib)   NASAL SINUS SURGERY     x 2   NM MYOVIEW LTD  02/11/2021   Lexiscan:EF 55-65%.  Normal function.  No ischemia or infarction.  No EKG changes noted.   PROXIMAL INTERPHALANGEAL FUSION (PIP) Left 01-05-2001    dr graves   correction clawtoe and extensor tendon lengthening   RIGHT/LEFT HEART CATH AND CORONARY ANGIOGRAPHY N/A 06/28/2016   Procedure: Right/Left Heart Cath and Coronary Angiography;  Surgeon: Piedad Climes  Herbie Baltimore, MD;  Location: [facility] INVASIVE CV LAB;  Service: Cardiovascular: mRCA 99% (TIMI 2), mLAD ~65% (FFR 0.75), OM3 55%. mild Pulm HTN. Mod LVEDP elevation. EF 55-65%.   RIGHT/LEFT HEART CATH AND CORONARY ANGIOGRAPHY N/A 10/11/2020   Procedure: RIGHT/LEFT HEART CATH AND CORONARY ANGIOGRAPHY;  Surgeon: Marykay Lex, MD;  Location: [facility] INVASIVE CV LAB;; Patent DES in mRCA & p-mLAD. CULPRIT = m-d LAD 70% (just after D1 w/ Ost 60%) FFR 0.75 => DES PCI m-dLAD & PTCA ost D1 (overlapping prior stent distally & crossing D2 w/  Ost D2 post PTCA). RIGHT HEART CATH PRESSURES,  & CO-CI ARE NORMAL. NO PULM HTN   RIGHT/LEFT HEART CATH AND CORONARY ANGIOGRAPHY N/A 07/04/2021   Procedure: RIGHT/LEFT HEART CATH AND CORONARY ANGIOGRAPHY;  Surgeon: Marykay Lex, MD;  Location: [name] INVASIVE CV LAB;  Service: Cardiovascular;; moderately elevated LVEDP of 20 mmHg, and PCWP-28 mmHg -> mean PAP of 24 mmHg.  Moderate AS-mean gradient 33 mmHg.  (Similar to Echo 26 mmHg); borderline low BP.  CO-CI 9.2 and 4.0.. Stable CAD W/ progression of ISR of LAD stent at D2 => PCI   ROTATOR CUFF REPAIR Bilateral last one 2014   TONSILLECTOMY  age 79   TRANSTHORACIC ECHOCARDIOGRAM  09/29/2020   EF  > 75%, Hyperdynamic. No RWMA. Mild LVH. ~ DD with mild LA dilation. Mild AI. Normal RV size & fxn, ? Dilated IVC c/w CVP ~ 15 mHg.   TRANSTHORACIC ECHOCARDIOGRAM  04/12/2021   EF estimated 65%.  Normal LV size and function.  No RWMA.  Mild LVH.  GRII DD.  Elevated LVEDP (c/w HFpEF).  Normal RV size.  Mildly elevated PAP estimated 36 mmHg.  Mild MR.  Mild AS (mean gradient 26 mmHg.  DI 0.32.  Aortic root 40 mm, ascending aorta 41 mm.   URETEROSCOPIC LASER LITHOTRIPSY STONE EXTRACTIONS/  STENT PLACEMENT Bilateral 09-10-2007   dr Annabell Howells  [facility]   per pt has had several prior ureteroscopic stone extraction since age 34 , last one 09-10-2007   Zio patch monitor:  02/2021   Probably Sinus Rhythm: Heart rate 56-146 bpm, average 83 bpm.  Frequent PACs noted with rare isolated PVCs.  Multiple episodes of PAT/atrial runs-longest 23 seconds average heart rate 95 bpm, fastest 5 beats at a max rate of 231 bpm.  No evidence of A-fib or atrial flutter.  No prolonged or sustained SVT/PAT.    Allergies  Allergies  Allergen Reactions   Hydrocodone Shortness Of Breath    In combination with decongestants   Other Palpitations and Other (See Comments)    ALL DECONGESTANTS - CAUSE PALPITATIONS; THROWS HEART RHYTHM OUT OF BALANCE   Oxycodone Shortness Of Breath   Sudafed [Pseudoephedrine] Palpitations   Tramadol Shortness Of Breath and Rash    flushing   Lopid [Gemfibrozil] Other (See Comments)    Dizziness      Home Medications    Prior to Admission medications   Medication Sig Start Date End Date Taking? Authorizing Provider  acetaminophen (TYLENOL) 650 MG CR tablet Take 1,300 mg by mouth See admin instructions. Take 2 tablets (1300 mg) by mouth scheduled at bedtime & may take an additional dose during the day if needed for pain.    [provider]  Adalimumab 40 MG/0.4ML PSKT Inject 40 mg into the skin every 14 (fourteen) days.     [provider]  albuterol (VENTOLIN HFA) 108 (90 Base)  MCG/ACT inhaler Inhale 1-2 puffs into the lungs every 6 (six) hours  as needed. 02/01/21   Parrett, Virgel Bouquet, NP  apixaban (ELIQUIS) 5 MG TABS tablet Take 1 tablet (5 mg total) by mouth 2 (two) times daily. 01/12/21   Fenton, Clint R, PA  Biotin 2500 MCG CAPS Take 5,000 mcg by mouth in the morning.    [provider]  Calcium Carbonate-Vit D-Min (CALCIUM 1200 PO) Take 1 tablet by mouth every evening.    [provider]  cetirizine (ZYRTEC) 10 MG tablet Take 10 mg by mouth in the morning.    [provider]  diltiazem (CARDIZEM CD) 180 MG 24 hr capsule Take 1 capsule (180 mg total) by mouth daily. 11/11/22   Marykay Lex, MD  empagliflozin (JARDIANCE) 25 MG TABS tablet TAKE ONE-HALF TABLET BY MOUTH EVERY MORNING FOR HEART AND DIABETES CONTINUE PER OUTSIDE CARDIOLOGY. THIS PILL REPLACES NON-VA  DAPAGLIFLOZIN TAB. CONTINUE PER OUTSIDE CARDIOLOGY. THIS PILL REPLACES NON-VA   DAPAGLIFLOZIN TAB. 12/26/21   [provider]  ferrous sulfate 325 (65 FE) MG tablet Take 325 mg by mouth every evening.    Ronney Asters, NP  furosemide (LASIX) 20 MG tablet Take 1 tablet (20 mg total) by mouth 2 (two) times a week. (Monday and Thursday). Ok to take additional tablet as needed for weight gain of 2 pounds overnight or 5 pounds in one week. 12/24/21 02/17/23  Marykay Lex, MD  gabapentin (NEURONTIN) 300 MG capsule Take 300-600 mg by mouth See admin instructions. Take 1 capsule (300 mg) by mouth in the morning and 2 capsules (600 mg) by mouth in th evening    [provider]  galantamine (RAZADYNE ER) 24 MG 24 hr capsule Take 24 mg by mouth in the morning. 08/30/20   [provider]  memantine (NAMENDA) 5 MG tablet Take 10 mg by mouth at bedtime. 08/30/20   [provider]  metFORMIN (GLUCOPHAGE-XR) 500 MG 24 hr tablet Take 500 mg by mouth at bedtime.    [provider]  nitroGLYCERIN (NITROSTAT) 0.4 MG SL tablet Place 1 tablet (0.4 mg total) under  the tongue every 5 (five) minutes as needed. 07/04/21   Arty Baumgartner, NP  Polyethyl Glycol-Propyl Glycol (SYSTANE OP) Place 1 drop into both eyes 3 (three) times daily as needed (dry/irritated eyes.).    [provider]  potassium chloride SA (KLOR-CON M) 20 MEQ tablet Take 1 tablet on days you take additional lasix 07/04/21   Laverda Page B, NP  prazosin (MINIPRESS) 1 MG capsule Take 3 mg by mouth at bedtime. 12/13/20   [provider]  predniSONE (DELTASONE) 5 MG tablet Take 1 tablet by mouth once daily with breakfast 08/29/22   Oretha Milch, MD  ranolazine (RANEXA) 500 MG 12 hr tablet Take 1 tablet (500 mg total) by mouth 2 (two) times daily. 11/11/22   Marykay Lex, MD  rosuvastatin (CRESTOR) 40 MG tablet Take 1 tablet (40 mg total) by mouth daily. 12/28/21   Marykay Lex, MD  tamsulosin (FLOMAX) 0.4 MG CAPS capsule Take 1 capsule by mouth once daily Patient taking differently: Take 0.4 mg by mouth daily as needed (kidney stones). 03/13/21   McKenzie, Mardene Celeste, MD    Physical Exam    Vital Signs:  Kenneth [name] does not have vital signs available for review today.  Given telephonic nature of communication, physical exam is limited. AAOx3. NAD. Normal affect.  Speech and respirations are unlabored.  Accessory Clinical Findings    None  Assessment & Plan  1.  Preoperative Cardiovascular Risk Assessment: According to the Revised Cardiac Risk Index (RCRI), his Perioperative Risk of Major Cardiac Event is (%): 0.9  His Functional Capacity in METs is: 7.99 according to the [facility] Activity Status Index (DASI).   The patient was advised that if he develops new symptoms prior to surgery to contact our office to arrange for a follow-up visit, and he verbalized understanding.  Per Dr. Herbie Baltimore may hold Eliquis 2-3 days prior to procedure.   Per office protocol, if patient is without any new symptoms or concerns at the time of their virtual visit, he/she may  hold Eliquis for 2-3 days prior to procedure. Please resume Eliquis as soon as possible postprocedure, at the discretion of the surgeon.    Therefore, based on ACC/AHA guidelines, patient would be at acceptable risk for the planned procedure without further cardiovascular testing. I will route this recommendation to the requesting party via Epic fax function.    A copy of this note will be routed to requesting surgeon.  Time:   Today, I have spent 15 minutes with the patient with telehealth technology discussing medical history, symptoms, and management plan.     Joni Reining, NP  12/13/2022, 3:04 PM

## 2022-12-16 ENCOUNTER — Encounter (HOSPITAL_BASED_OUTPATIENT_CLINIC_OR_DEPARTMENT_OTHER)
Admission: RE | Disposition: A | Discharge status: Home / Self Care | Payer: Self-pay | Source: Home / Self Care | Attending: Urology

## 2022-12-16 ENCOUNTER — Ambulatory Visit (HOSPITAL_COMMUNITY): Payer: Medicare Other

## 2022-12-16 ENCOUNTER — Ambulatory Visit (HOSPITAL_BASED_OUTPATIENT_CLINIC_OR_DEPARTMENT_OTHER)
Admission: RE | Admit: 2022-12-16 | Discharge: 2022-12-16 | Disposition: A | Discharge status: Home / Self Care | Payer: Medicare Other | Attending: Urology | Admitting: Urology

## 2022-12-16 ENCOUNTER — Other Ambulatory Visit: Payer: Self-pay

## 2022-12-16 DIAGNOSIS — N200 Calculus of kidney: Secondary | ICD-10-CM | POA: Diagnosis not present

## 2022-12-16 DIAGNOSIS — Z539 Procedure and treatment not carried out, unspecified reason: Secondary | ICD-10-CM | POA: Insufficient documentation

## 2022-12-16 DIAGNOSIS — N201 Calculus of ureter: Secondary | ICD-10-CM | POA: Diagnosis not present

## 2022-12-16 HISTORY — PX: EXTRACORPOREAL SHOCK WAVE LITHOTRIPSY: SHX1557

## 2022-12-16 SURGERY — LITHOTRIPSY, ESWL
Anesthesia: LOCAL | Laterality: Right

## 2022-12-16 MED ORDER — DIAZEPAM 5 MG PO TABS
ORAL_TABLET | ORAL | Status: AC
  Filled 2022-12-16: qty 2

## 2022-12-16 MED ORDER — SODIUM CHLORIDE 0.9 % IV SOLN: INTRAVENOUS | Status: DC

## 2022-12-16 MED ORDER — CIPROFLOXACIN HCL 500 MG PO TABS
500.0000 mg | ORAL_TABLET | ORAL | Status: AC
  Administered 2022-12-16: 500 mg via ORAL

## 2022-12-16 MED ORDER — DIPHENHYDRAMINE HCL 25 MG PO CAPS
25.0000 mg | ORAL_CAPSULE | ORAL | Status: AC
  Administered 2022-12-16: 25 mg via ORAL

## 2022-12-16 MED ORDER — DIPHENHYDRAMINE HCL 25 MG PO CAPS
ORAL_CAPSULE | ORAL | Status: AC
  Filled 2022-12-16: qty 1

## 2022-12-16 MED ORDER — CIPROFLOXACIN HCL 500 MG PO TABS
ORAL_TABLET | ORAL | Status: AC
  Filled 2022-12-16: qty 1

## 2022-12-16 MED ORDER — DIAZEPAM 5 MG PO TABS
10.0000 mg | ORAL_TABLET | ORAL | Status: AC
  Administered 2022-12-16: 10 mg via ORAL

## 2022-12-16 NOTE — H&P (Signed)
12/10/2022: Kenneth [name] returns, CT imaging at last exam showed evidence of a right proximal ureteral calculi with only mild obstructive signs. He was symptomatic in regards to this with increased LUTS, pain and gross hematuria. We elected to try medical expulsive therapy given the size of the offending mechanism. Tamsulosin was prescribed as well as pain medication. Today doing okay. He has had some mild intermittent hematuria and some increased pelvic discomfort but not severe enough to where he has required the use of pain medications since I last saw him. Also describing some increased urinary urgency as well as intermittency of stream. He continues tamsulosin. He has been trying to stay active and attributes that to less severity of symptoms. He denies any interval fevers or chills, nausea/vomiting. UA today continues with significant amount of microscopic hematuria and calcium oxalate crystals.   11/28/2022: Here today for acute visit. Endorsing some testicular pain and discomfort for the past 2 to 3 days. Yesterday began passing gross hematuria each void. Last night he had pretty severe right lower back pain but heating pad was helpful. No correlating fevers or chills, nausea/vomiting. He denies any recent stone material passage.   08/07/22: Kenneth [name] returns today in f/u for his history of stones and ED. His UA today has 2+ glucose. A KUB is clear. The renal stones on his CT and renal US last year were quite small. He has some lower abdominal discomfort at night that is aggravating but not severe. It has been present about a month. He denies GI complaints. His IPSS is 16 with nocturia x 1. He remains on tamsulosin for the BPH with BOO. He continues to use sildenafil for the ED.   03/04/22: Kenneth [name] returns today with an [place] for f/u of his history of stones and ureteroscopy in July. He has no pain or gross hematuria at this time and his IPSS is 0. A RUS today shows no hydro but small bilateral stones. this PVR is 65ml.  His UA has 3-10 RBC's. He uses tamsulosin sporadically but isn't on it now. He remains on sildenafil for the ED. He has continued to lose weight with activity.   10/29/2021: Returns today for stent removal following left ureteroscopy to treat a previously identified left mid and proximal ureteral calculi. He had a lot of bleeding encountered especially in the more proximal portion of the ureter and kidney but the 2 offending stains were appropriately treated with all visible fragments were removed. Ureteral stent left at the conclusion of the procedure. He has poorly tolerated the stent complaining of persistent gross hematuria and painful voiding. He also states passing some stone material following the procedure. He denies fevers or chills, nausea/vomiting.     ALLERGIES: Decongestant TABS - Other Reaction, Tachycardia   MEDICATIONS: Metformin Hcl 500 mg tablet 1 tablet PO Daily  Metoprolol Tartrate 25 mg tablet  Tamsulosin Hcl 0.4 mg capsule 1 capsule PO Daily  Tamsulosin Hcl 0.4 mg capsule 1 capsule PO Daily  Acetaminophen 500 mg tablet 1 tablet PO Daily  Acitretin 10 mg capsule  Biotin 1,000 mcg tablet,chewable 1 tablet PO Daily  Calcipotriene 0.005 % ointment 1 PO Daily  Cetirizine Hcl 10 mg tablet 1 tablet PO Daily  Clobetasol Propionate 0.05 % cream 1 PO Daily  Diltiazem 24Hr Er (Cd) 180 mg capsule, ext release 24 hr 1 capsule PO Daily  Eliquis 5 mg tablet 1 tablet PO BID  Ferrous Sulfate 325 mg (65 mg iron) tablet 1 tablet PO Daily  Furosemide 20 mg  tablet  Gabapentin 300 mg capsule 1 capsule PO Daily  Hydromorphone Hcl 2 mg tablet 1/2 tablet PO Q 6 H PRN Take 1/2 to 1 tablet by mouth every 6 hours as needed for severe pain and discomfort  Nitroglycerin  Prazosin Hcl  Prednisone 5 mg tablet 1 tablet PO Daily  Rosuvastatin Calcium 20 mg tablet 1 tablet PO Daily  Sildenafil Citrate 100 mg tablet 1 tablet PO as needed  Sildenafil Citrate 100 mg tablet 1 tablet PO Daily  Zolpidem  Tartrate 10 mg tablet 1 tablet PO Daily    GU PSH: Cysto Remove Stent FB Sim - 10/29/2021 Cysto Uretero Lithotripsy, Right - 2021, 2009 Cystoscopy Insert Stent - 2009 ESWL, Left - 2022 Locm 300-399Mg /Ml Iodine,1Ml - 2019 Ureteroscopic laser litho - 10/22/2021, Right - 2021, Right - 2021, Right - 2019 Ureteroscopic stone removal, Right - 2019, 2009      PSH Notes: Cardiac Catheter His Ablation    NON-GU PSH: Appendectomy - 2009 Cardiac Stent Placement - about 2018 Cataract surgery Sinus Surgery.. Visit Complexity (formerly GPC1X) - 08/07/2022         GU PMH: Gross hematuria - 11/28/2022, - 10/19/2021, - 2021, - 2021, He continues to have hemturia that is probably secondary to the right proximal ureteral stone and antiplatelet therapy. He will hold the plavix and continue aspirin until the bleeding stops. I will get him set up for cystoscopy with right ureteroscopy with stone extraction. I have reviewed the risks of ureteroscopy including bleeding, infection, ureteral injury, need for a stent or secondary procedures, thrombotic events and anesthetic complications. , - 2019 (Acute), Will proceed with hematuria workup with CT and cysto. Will check PSA today. Culture urine and send for cytology. Will empirically begin Cephalexin 500 mg 1 po BID X 7 days. , - 2019 History of urolithiasis - 11/28/2022, Nephrolithiasis, - 2014 Renal colic - 11/28/2022 BPH w/LUTS, He has moderate LUTS on tamsulosin. Continue that med. - 08/07/2022, - 2021, - 2021, Benign prostatic hyperplasia with urinary obstruction, - 2014 ED due to arterial insufficiency, He continues to respond to sildenafil. - 08/07/2022, I refilled the sildenafil. , - 03/04/2022, sildenafil redispensed. , - 10/19/2021, sildenafill refilled. , - 2023, sildenafil was dispensed. , - 2021, He will get 100mg  sildenafil. , - 2019, Erectile dysfunction due to arterial insufficiency, - 2014 Renal calculus, I don't clearly see stones on the KUB today but they  were small on the prior RUS and CT. I will have him return in 6 months. - 08/07/2022, (Stable), Mild bilateral renal stones but no hydro. F/u in 6 months with a KUB. , - 03/04/2022, - 2022, the LLP stone is now at the UPJ and measures 6x57mm. I have recommended he have ESWL for treatment. I reviewed the risks of ESWL including bleeding, infection, injury to the kidney or adjacent structures, failure to fragment the stone, need for ancillary procedures, thrombotic events, cardiac arrhythmias and sedation complications. , - 2022, He has 2 small non-obstructing right renal stones. he has been rescheduled for f/u on 4/18. , - 2022, He has bilateral non-obstructing calculi in each kidney., - 2021, He has a renal stone but I don't see a ureteral stone but with the hematuria and pain, a small ureteral stone is possible. I have sent a script for hydromorphone and he will return in 2 weeks for reevaluation. If his symptoms persist, he will need a CT. , - 2021, He has bilateral renal stones that are non-obstructing. I will just have him  return in 6 months with a KUB. , - 2021, - 2021, - 2021, - 2021, - 2021, - 2021, - 2020, He has possible stones on [place] but the KUB is clear and his CT from 7/22 didn't show significant left renal stones. I don't think the flank pain is from a stone. , - 2019 (Stable), - 2019, Kidney stone on right side, - 2014 Urinary Frequency (Stable) - 08/07/2022, - 2021 Microscopic hematuria, He has 3-10 RBC's. - 03/04/2022, (Stable), He has 3-10 RBC's on UA today. He will resume the Eliquis. , - 03/30/2021, - 2022, I will have him return in 6months for reevaluation. , - 2019 Ureteral calculus - 10/29/2021, Kenneth [name] has left mid and proximal ureteral stone with obstruction and pain. I discussed options for treatment and will get him set up for ureteroscopy on Monday. I will have him hold his Eliquis after Saturday morning. I have reviewed the risks of ureteroscopy including bleeding, infection, ureteral injury,  need for a stent or secondary procedures, thrombotic events and anesthetic complications. , - 10/19/2021, He is doing well with resolution of the pain and urgency. His UA is clear today. F/u in 6 months with a KUB. , - 2023, He had a 4.55mm stone at the left UVJ on 03/15/21 with hematuria. His pain currently doesn't seem consistent with stone pain and the KUB today doesn't show an obvious ureteral stone but it may now be in the LLP. I will have him resume the Eliquis and if he bleds again, I will have him get a CT to reassess the stone. He will otherwise return in 2-3 weeks for f/u. , - 03/30/2021, - 2022, - 2022 (Stable), - 2021 (Stable), - 2019, Mid Ureteral Stone On The Left, - 2014, Distal Ureteral Stone On The Right, - 2014, Distal Ureteral Stone On The Right, - 2014, Calculus of distal right ureter, - 2014, Calculus of ureter, - 2014 Ureteral obstruction secondary to calculous - 10/29/2021, (Stable), - 10/19/2021, - 2021 Periumbilical pain - 10/19/2021 Renal and ureteral calculus - 2021 Flank Pain - 2021, Generalized abdominal pain, - 2014 Nocturia - 2021 Dysuria - 2020 Acute Cystitis/UTI - 2019 Other microscopic hematuria, Microscopic hematuria - 2014 Testicular atrophy, Testicular atrophy - 2014 Urinary Urgency, Urinary urgency - 2014      PMH Notes: heart murmur   Shingles   NON-GU PMH: Atherosclerosis of aorta, He has aortoiliac and coronary calcifications but is on metformin and rosuvastatin and is followed at the [place]. I just let him know the findings on the most recent CT. - 2021 Asthma, Asthma - 2014 Cardiac murmur, unspecified, Murmurs - 2014 Unspecified atrial fibrillation, Atrial Fibrillation - 2014 Arrhythmia Arthritis Atrial Fibrillation Coronary Artery Disease Diabetes Type 2 Encounter for general adult medical examination without abnormal findings, Encounter for preventive health examination Hypercholesterolemia Hypertension    FAMILY HISTORY: Breast Cancer - Mother Colon  Cancer - Mother Dementia - Father, Mother Kidney Stones - Mother Prostate Cancer - Brother    Notes: Mother deceased at age 23 from dementia  Father deceased at 52 from dementia   SOCIAL HISTORY: Marital Status: Married Preferred Language: English; Ethnicity: Not Hispanic Or Latino; Race: White Current Smoking Status: Patient has never smoked.  <DIV'  Tobacco Use Assessment Completed:  Used Tobacco in last 30 days?   Does not use smokeless tobacco. Drinks 1 drink per year.  Does not use drugs. Drinks 1 caffeinated drink per day. Patient's occupation is/was Retired.    REVIEW OF SYSTEMS:  GU Review Male:  Patient denies erection problems, get up at night to urinate, trouble starting your stream, leakage of urine, hard to postpone urination, frequent urination, burning/ pain with urination, stream starts and stops, have to strain to urinate , and penile pain.    Gastrointestinal (Upper):  Patient denies nausea, vomiting, and indigestion/ heartburn.    Gastrointestinal (Lower):  Patient denies diarrhea and constipation.    Constitutional:  Patient denies fever, night sweats, weight loss, and fatigue.    Skin:  Patient denies skin rash/ lesion and itching.    Eyes:  Patient denies blurred vision and double vision.    Ears/ Nose/ Throat:  Patient denies sore throat and sinus problems.    Hematologic/Lymphatic:  Patient denies swollen glands and easy bruising.    Cardiovascular:  Patient denies leg swelling and chest pains.    Respiratory:  Patient denies cough and shortness of breath.    Endocrine:  Patient denies excessive thirst.    Musculoskeletal:  Patient denies back pain and joint pain.    Neurological:  Patient denies headaches and dizziness.    Psychologic:  Patient denies depression and anxiety.    VITAL SIGNS:       12/10/2022 09:23 AM     Weight 203 lb / 92.08 kg     Height 75 in / 190.5 cm     BP 120/6 mmHg     Pulse 80 /min     BMI 25.4 kg/m - BMI Counseling was  provided.     MULTI-SYSTEM PHYSICAL EXAMINATION:      Constitutional: Well-nourished. No physical deformities. Normally developed. Good grooming.     Neck: Neck symmetrical, not swollen. Normal tracheal position.     Respiratory: No labored breathing, no use of accessory muscles.      Cardiovascular: Normal temperature, normal extremity pulses, no swelling, no varicosities.     Skin: No paleness, no jaundice, no cyanosis. No lesion, no ulcer, no rash.     Neurologic / Psychiatric: Oriented to time, oriented to place, oriented to person. No depression, no anxiety, no agitation.     Gastrointestinal: No mass, no tenderness, no rigidity, non obese abdomen. NO CVA or flank tenderness.     Musculoskeletal: Normal gait and station of head and neck.            Complexity of Data:   Source Of History:  Patient, Family/Caregiver, Medical Record Summary  Records Review:  Previous Doctor Records, Previous Hospital Records, Previous Patient Records  Urine Test Review:  Urinalysis, Urine Culture  X-Ray Review: KUB: Reviewed Films. Discussed With Patient.  C.T. Stone Protocol: Reviewed Films. Reviewed Report.      09/22/17 08/29/09  PSA  Total PSA 1.95 ng/mL 1.69     10/20/07  Hormones  Testosterone, Total 2.33     12/10/22  Urinalysis  Urine Appearance Cloudy   Urine Color Yellow   Urine Glucose 3+ mg/dL  Urine Bilirubin Neg mg/dL  Urine Ketones Neg mg/dL  Urine Specific Gravity 1.020   Urine Blood 3+ ery/uL  Urine pH 5.5   Urine Protein Neg mg/dL  Urine Urobilinogen 0.2 mg/dL  Urine Nitrites Neg   Urine Leukocyte Esterase Neg leu/uL  Urine WBC/hpf 0 - 5/hpf   Urine RBC/hpf >60/hpf   Urine Epithelial Cells 0 - 5/hpf   Urine Bacteria Rare (0-9/hpf)   Urine Mucous Not Present   Urine Yeast NS (Not Seen)   Urine Trichomonas Not Present   Urine Cystals Ca Oxalate  Urine Casts NS (Not Seen)   Urine Sperm Not Present    PROCEDURES:    KUB - 62130  A single view of the abdomen  is obtained. An opacity consistent with a right proximal ureteral calculi measuring about 4.5 mm is easily identified on today's exam. I drew an arrow for better identification in the imaging program.      Patient confirmed No Neulasta OnPro Device.         PVR Ultrasound - 86578    Visit Complexity - G2211      Urinalysis w/Scope - 81001  Dipstick Dipstick Cont'd Micro  Color: Yellow Bilirubin: Neg WBC/hpf: 0 - 5/hpf  Appearance: Cloudy Ketones: Neg RBC/hpf: >60/hpf  Specific Gravity: 1.020 Blood: 3+ Bacteria: Rare (0-9/hpf)  pH: 5.5 Protein: Neg Cystals: Ca Oxalate  Glucose: 3+ Urobilinogen: 0.2 Casts: NS (Not Seen)   Nitrites: Neg Trichomonas: Not Present   Leukocyte Esterase: Neg Mucous: Not Present    Epithelial Cells: 0 - 5/hpf    Yeast: NS (Not Seen)    Sperm: Not Present   Notes:       ASSESSMENT:     ICD-10 Details  1 GU:  Ureteral calculus - N20.1 Right, Acute, Complicated Injury   PLAN:   Orders  Labs Urine Culture  X-Rays: KUB  Schedule  Return Visit/Planned Activity: Next Available Appointment - Schedule Surgery, Follow up MD  Document  Letter(s):  Created for Patient: Clinical Summary   Notes:  KUB shows continued right proximal ureteral calculi previously identified on CT stone protocol study at last visit. Symptoms have been fairly stable but with no interval progression of the stone and the patient's tendency to have fairly severe exacerbations of poorly controlled colic symptoms, I have recommended proceeding with a definitive intervention at this time. Given stones visibility he is a candidate for shockwave lithotripsy and has had that in the past tolerating it well.   For shockwave lithotripsy I described the risks which include arrhythmia, kidney contusion, kidney hemorrhage, need for transfusion, long-term risk of diabetes or hypertension, back discomfort, flank ecchymosis, flank abrasion, inability to break up stone, inability to pass stone  fragments, [place]str[place], infection associated with obstructing stones, need for different surgical procedure and possible need for repeat shockwave lithotripsy.   He is on Eliquis, a clearance letter will be sent to his cardiologist for him to hold the medication prior to the procedure. In the meantime, patient will continue tamsulosin, continue efforts at staying well-hydrated and monitoring for any interval stone material passage. He has pain medication to use if needed. In the event he passes a stone before procedure is scheduled/completed, he will contact the office and let us know. He also understands he can be seen for evaluation of worsening symptomology as well. Appropriate ED follow-up instructions for similar symptoms also communicated.

## 2022-12-16 NOTE — Interval H&P Note (Signed)
History and Physical Interval Note:  The KUB may show a faint shadow over adjacent to the mid lumbar spine that could be the stone but it isn't clearly seen.  He took Jardiance this am so we will have to see if we can proceed with the treatment.   12/16/2022 9:29 AM  Kenneth [name]  has presented today for surgery, with the diagnosis of RIGHT URETERAL STONE.  The various methods of treatment have been discussed with the patient and family. After consideration of risks, benefits and other options for treatment, the patient has consented to  Procedure(s): RIGHT EXTRACORPOREAL SHOCK WAVE LITHOTRIPSY (ESWL) (Right) as a surgical intervention.  The patient's history has been reviewed, patient examined, no change in status, stable for surgery.  I have reviewed the patient's chart and labs.  Questions were answered to the patient's satisfaction.     Bjorn Pippin

## 2022-12-16 NOTE — H&P (View-Only) (Signed)
12/10/2022: Kenneth [name] returns, CT imaging at last exam showed evidence of a right proximal ureteral calculi with only mild obstructive signs. He was symptomatic in regards to this with increased LUTS, pain and gross hematuria. We elected to try medical expulsive therapy given the size of the offending mechanism. Tamsulosin was prescribed as well as pain medication. Today doing okay. He has had some mild intermittent hematuria and some increased pelvic discomfort but not severe enough to where he has required the use of pain medications since I last saw him. Also describing some increased urinary urgency as well as intermittency of stream. He continues tamsulosin. He has been trying to stay active and attributes that to less severity of symptoms. He denies any interval fevers or chills, nausea/vomiting. UA today continues with significant amount of microscopic hematuria and calcium oxalate crystals.   11/28/2022: Here today for acute visit. Endorsing some testicular pain and discomfort for the past 2 to 3 days. Yesterday began passing gross hematuria each void. Last night he had pretty severe right lower back pain but heating pad was helpful. No correlating fevers or chills, nausea/vomiting. He denies any recent stone material passage.   08/07/22: Kenneth [name] returns today in f/u for his history of stones and ED. His UA today has 2+ glucose. A KUB is clear. The renal stones on his CT and renal US last year were quite small. He has some lower abdominal discomfort at night that is aggravating but not severe. It has been present about a month. He denies GI complaints. His IPSS is 16 with nocturia x 1. He remains on tamsulosin for the BPH with BOO. He continues to use sildenafil for the ED.   03/04/22: Kenneth [name] returns today with an [place] for f/u of his history of stones and ureteroscopy in July. He has no pain or gross hematuria at this time and his IPSS is 0. A RUS today shows no hydro but small bilateral stones. this PVR is 65ml.  His UA has 3-10 RBC's. He uses tamsulosin sporadically but isn't on it now. He remains on sildenafil for the ED. He has continued to lose weight with activity.   10/29/2021: Returns today for stent removal following left ureteroscopy to treat a previously identified left mid and proximal ureteral calculi. He had a lot of bleeding encountered especially in the more proximal portion of the ureter and kidney but the 2 offending stains were appropriately treated with all visible fragments were removed. Ureteral stent left at the conclusion of the procedure. He has poorly tolerated the stent complaining of persistent gross hematuria and painful voiding. He also states passing some stone material following the procedure. He denies fevers or chills, nausea/vomiting.     ALLERGIES: Decongestant TABS - Other Reaction, Tachycardia   MEDICATIONS: Metformin Hcl 500 mg tablet 1 tablet PO Daily  Metoprolol Tartrate 25 mg tablet  Tamsulosin Hcl 0.4 mg capsule 1 capsule PO Daily  Tamsulosin Hcl 0.4 mg capsule 1 capsule PO Daily  Acetaminophen 500 mg tablet 1 tablet PO Daily  Acitretin 10 mg capsule  Biotin 1,000 mcg tablet,chewable 1 tablet PO Daily  Calcipotriene 0.005 % ointment 1 PO Daily  Cetirizine Hcl 10 mg tablet 1 tablet PO Daily  Clobetasol Propionate 0.05 % cream 1 PO Daily  Diltiazem 24Hr Er (Cd) 180 mg capsule, ext release 24 hr 1 capsule PO Daily  Eliquis 5 mg tablet 1 tablet PO BID  Ferrous Sulfate 325 mg (65 mg iron) tablet 1 tablet PO Daily  Furosemide 20 mg  tablet  Gabapentin 300 mg capsule 1 capsule PO Daily  Hydromorphone Hcl 2 mg tablet 1/2 tablet PO Q 6 H PRN Take 1/2 to 1 tablet by mouth every 6 hours as needed for severe pain and discomfort  Nitroglycerin  Prazosin Hcl  Prednisone 5 mg tablet 1 tablet PO Daily  Rosuvastatin Calcium 20 mg tablet 1 tablet PO Daily  Sildenafil Citrate 100 mg tablet 1 tablet PO as needed  Sildenafil Citrate 100 mg tablet 1 tablet PO Daily  Zolpidem  Tartrate 10 mg tablet 1 tablet PO Daily    GU PSH: Cysto Remove Stent FB Sim - 10/29/2021 Cysto Uretero Lithotripsy, Right - 2021, 2009 Cystoscopy Insert Stent - 2009 ESWL, Left - 2022 Locm 300-399Mg /Ml Iodine,1Ml - 2019 Ureteroscopic laser litho - 10/22/2021, Right - 2021, Right - 2021, Right - 2019 Ureteroscopic stone removal, Right - 2019, 2009      PSH Notes: Cardiac Catheter His Ablation    NON-GU PSH: Appendectomy - 2009 Cardiac Stent Placement - about 2018 Cataract surgery Sinus Surgery.. Visit Complexity (formerly GPC1X) - 08/07/2022         GU PMH: Gross hematuria - 11/28/2022, - 10/19/2021, - 2021, - 2021, He continues to have hemturia that is probably secondary to the right proximal ureteral stone and antiplatelet therapy. He will hold the plavix and continue aspirin until the bleeding stops. I will get him set up for cystoscopy with right ureteroscopy with stone extraction. I have reviewed the risks of ureteroscopy including bleeding, infection, ureteral injury, need for a stent or secondary procedures, thrombotic events and anesthetic complications. , - 2019 (Acute), Will proceed with hematuria workup with CT and cysto. Will check PSA today. Culture urine and send for cytology. Will empirically begin Cephalexin 500 mg 1 po BID X 7 days. , - 2019 History of urolithiasis - 11/28/2022, Nephrolithiasis, - 2014 Renal colic - 11/28/2022 BPH w/LUTS, He has moderate LUTS on tamsulosin. Continue that med. - 08/07/2022, - 2021, - 2021, Benign prostatic hyperplasia with urinary obstruction, - 2014 ED due to arterial insufficiency, He continues to respond to sildenafil. - 08/07/2022, I refilled the sildenafil. , - 03/04/2022, sildenafil redispensed. , - 10/19/2021, sildenafill refilled. , - 2023, sildenafil was dispensed. , - 2021, He will get 100mg  sildenafil. , - 2019, Erectile dysfunction due to arterial insufficiency, - 2014 Renal calculus, I don't clearly see stones on the KUB today but they  were small on the prior RUS and CT. I will have him return in 6 months. - 08/07/2022, (Stable), Mild bilateral renal stones but no hydro. F/u in 6 months with a KUB. , - 03/04/2022, - 2022, the LLP stone is now at the UPJ and measures 6x57mm. I have recommended he have ESWL for treatment. I reviewed the risks of ESWL including bleeding, infection, injury to the kidney or adjacent structures, failure to fragment the stone, need for ancillary procedures, thrombotic events, cardiac arrhythmias and sedation complications. , - 2022, He has 2 small non-obstructing right renal stones. he has been rescheduled for f/u on 4/18. , - 2022, He has bilateral non-obstructing calculi in each kidney., - 2021, He has a renal stone but I don't see a ureteral stone but with the hematuria and pain, a small ureteral stone is possible. I have sent a script for hydromorphone and he will return in 2 weeks for reevaluation. If his symptoms persist, he will need a CT. , - 2021, He has bilateral renal stones that are non-obstructing. I will just have him  return in 6 months with a KUB. , - 2021, - 2021, - 2021, - 2021, - 2021, - 2021, - 2020, He has possible stones on [place] but the KUB is clear and his CT from 7/22 didn't show significant left renal stones. I don't think the flank pain is from a stone. , - 2019 (Stable), - 2019, Kidney stone on right side, - 2014 Urinary Frequency (Stable) - 08/07/2022, - 2021 Microscopic hematuria, He has 3-10 RBC's. - 03/04/2022, (Stable), He has 3-10 RBC's on UA today. He will resume the Eliquis. , - 03/30/2021, - 2022, I will have him return in 6months for reevaluation. , - 2019 Ureteral calculus - 10/29/2021, Harsh has left mid and proximal ureteral stone with obstruction and pain. I discussed options for treatment and will get him set up for ureteroscopy on Monday. I will have him hold his Eliquis after Saturday morning. I have reviewed the risks of ureteroscopy including bleeding, infection, ureteral injury,  need for a stent or secondary procedures, thrombotic events and anesthetic complications. , - 10/19/2021, He is doing well with resolution of the pain and urgency. His UA is clear today. F/u in 6 months with a KUB. , - 2023, He had a 4.55mm stone at the left UVJ on 03/15/21 with hematuria. His pain currently doesn't seem consistent with stone pain and the KUB today doesn't show an obvious ureteral stone but it may now be in the LLP. I will have him resume the Eliquis and if he bleds again, I will have him get a CT to reassess the stone. He will otherwise return in 2-3 weeks for f/u. , - 03/30/2021, - 2022, - 2022 (Stable), - 2021 (Stable), - 2019, Mid Ureteral Stone On The Left, - 2014, Distal Ureteral Stone On The Right, - 2014, Distal Ureteral Stone On The Right, - 2014, Calculus of distal right ureter, - 2014, Calculus of ureter, - 2014 Ureteral obstruction secondary to calculous - 10/29/2021, (Stable), - 10/19/2021, - 2021 Periumbilical pain - 10/19/2021 Renal and ureteral calculus - 2021 Flank Pain - 2021, Generalized abdominal pain, - 2014 Nocturia - 2021 Dysuria - 2020 Acute Cystitis/UTI - 2019 Other microscopic hematuria, Microscopic hematuria - 2014 Testicular atrophy, Testicular atrophy - 2014 Urinary Urgency, Urinary urgency - 2014      PMH Notes: heart murmur   Shingles   NON-GU PMH: Atherosclerosis of aorta, He has aortoiliac and coronary calcifications but is on metformin and rosuvastatin and is followed at the [place]. I just let him know the findings on the most recent CT. - 2021 Asthma, Asthma - 2014 Cardiac murmur, unspecified, Murmurs - 2014 Unspecified atrial fibrillation, Atrial Fibrillation - 2014 Arrhythmia Arthritis Atrial Fibrillation Coronary Artery Disease Diabetes Type 2 Encounter for general adult medical examination without abnormal findings, Encounter for preventive health examination Hypercholesterolemia Hypertension    FAMILY HISTORY: Breast Cancer - Mother Colon  Cancer - Mother Dementia - Father, Mother Kidney Stones - Mother Prostate Cancer - Brother    Notes: Mother deceased at age 23 from dementia  Father deceased at 52 from dementia   SOCIAL HISTORY: Marital Status: Married Preferred Language: English; Ethnicity: Not Hispanic Or Latino; Race: White Current Smoking Status: Patient has never smoked.  <DIV'  Tobacco Use Assessment Completed:  Used Tobacco in last 30 days?   Does not use smokeless tobacco. Drinks 1 drink per year.  Does not use drugs. Drinks 1 caffeinated drink per day. Patient's occupation is/was Retired.    REVIEW OF SYSTEMS:  GU Review Male:  Patient denies erection problems, get up at night to urinate, trouble starting your stream, leakage of urine, hard to postpone urination, frequent urination, burning/ pain with urination, stream starts and stops, have to strain to urinate , and penile pain.    Gastrointestinal (Upper):  Patient denies nausea, vomiting, and indigestion/ heartburn.    Gastrointestinal (Lower):  Patient denies diarrhea and constipation.    Constitutional:  Patient denies fever, night sweats, weight loss, and fatigue.    Skin:  Patient denies skin rash/ lesion and itching.    Eyes:  Patient denies blurred vision and double vision.    Ears/ Nose/ Throat:  Patient denies sore throat and sinus problems.    Hematologic/Lymphatic:  Patient denies swollen glands and easy bruising.    Cardiovascular:  Patient denies leg swelling and chest pains.    Respiratory:  Patient denies cough and shortness of breath.    Endocrine:  Patient denies excessive thirst.    Musculoskeletal:  Patient denies back pain and joint pain.    Neurological:  Patient denies headaches and dizziness.    Psychologic:  Patient denies depression and anxiety.    VITAL SIGNS:       12/10/2022 09:23 AM     Weight 203 lb / 92.08 kg     Height 75 in / 190.5 cm     BP 120/6 mmHg     Pulse 80 /min     BMI 25.4 kg/m - BMI Counseling was  provided.     MULTI-SYSTEM PHYSICAL EXAMINATION:      Constitutional: Well-nourished. No physical deformities. Normally developed. Good grooming.     Neck: Neck symmetrical, not swollen. Normal tracheal position.     Respiratory: No labored breathing, no use of accessory muscles.      Cardiovascular: Normal temperature, normal extremity pulses, no swelling, no varicosities.     Skin: No paleness, no jaundice, no cyanosis. No lesion, no ulcer, no rash.     Neurologic / Psychiatric: Oriented to time, oriented to place, oriented to person. No depression, no anxiety, no agitation.     Gastrointestinal: No mass, no tenderness, no rigidity, non obese abdomen. NO CVA or flank tenderness.     Musculoskeletal: Normal gait and station of head and neck.            Complexity of Data:   Source Of History:  Patient, Family/Caregiver, Medical Record Summary  Records Review:  Previous Doctor Records, Previous Hospital Records, Previous Patient Records  Urine Test Review:  Urinalysis, Urine Culture  X-Ray Review: KUB: Reviewed Films. Discussed With Patient.  C.T. Stone Protocol: Reviewed Films. Reviewed Report.      09/22/17 08/29/09  PSA  Total PSA 1.95 ng/mL 1.69     10/20/07  Hormones  Testosterone, Total 2.33     12/10/22  Urinalysis  Urine Appearance Cloudy   Urine Color Yellow   Urine Glucose 3+ mg/dL  Urine Bilirubin Neg mg/dL  Urine Ketones Neg mg/dL  Urine Specific Gravity 1.020   Urine Blood 3+ ery/uL  Urine pH 5.5   Urine Protein Neg mg/dL  Urine Urobilinogen 0.2 mg/dL  Urine Nitrites Neg   Urine Leukocyte Esterase Neg leu/uL  Urine WBC/hpf 0 - 5/hpf   Urine RBC/hpf >60/hpf   Urine Epithelial Cells 0 - 5/hpf   Urine Bacteria Rare (0-9/hpf)   Urine Mucous Not Present   Urine Yeast NS (Not Seen)   Urine Trichomonas Not Present   Urine Cystals Ca Oxalate  Urine Casts NS (Not Seen)   Urine Sperm Not Present    PROCEDURES:    KUB - 62130  A single view of the abdomen  is obtained. An opacity consistent with a right proximal ureteral calculi measuring about 4.5 mm is easily identified on today's exam. I drew an arrow for better identification in the imaging program.      Patient confirmed No Neulasta OnPro Device.         PVR Ultrasound - 86578    Visit Complexity - G2211      Urinalysis w/Scope - 81001  Dipstick Dipstick Cont'd Micro  Color: Yellow Bilirubin: Neg WBC/hpf: 0 - 5/hpf  Appearance: Cloudy Ketones: Neg RBC/hpf: >60/hpf  Specific Gravity: 1.020 Blood: 3+ Bacteria: Rare (0-9/hpf)  pH: 5.5 Protein: Neg Cystals: Ca Oxalate  Glucose: 3+ Urobilinogen: 0.2 Casts: NS (Not Seen)   Nitrites: Neg Trichomonas: Not Present   Leukocyte Esterase: Neg Mucous: Not Present    Epithelial Cells: 0 - 5/hpf    Yeast: NS (Not Seen)    Sperm: Not Present   Notes:       ASSESSMENT:     ICD-10 Details  1 GU:  Ureteral calculus - N20.1 Right, Acute, Complicated Injury   PLAN:   Orders  Labs Urine Culture  X-Rays: KUB  Schedule  Return Visit/Planned Activity: Next Available Appointment - Schedule Surgery, Follow up MD  Document  Letter(s):  Created for Patient: Clinical Summary   Notes:  KUB shows continued right proximal ureteral calculi previously identified on CT stone protocol study at last visit. Symptoms have been fairly stable but with no interval progression of the stone and the patient's tendency to have fairly severe exacerbations of poorly controlled colic symptoms, I have recommended proceeding with a definitive intervention at this time. Given stones visibility he is a candidate for shockwave lithotripsy and has had that in the past tolerating it well.   For shockwave lithotripsy I described the risks which include arrhythmia, kidney contusion, kidney hemorrhage, need for transfusion, long-term risk of diabetes or hypertension, back discomfort, flank ecchymosis, flank abrasion, inability to break up stone, inability to pass stone  fragments, [place], infection associated with obstructing stones, need for different surgical procedure and possible need for repeat shockwave lithotripsy.   He is on Eliquis, a clearance letter will be sent to his cardiologist for him to hold the medication prior to the procedure. In the meantime, patient will continue tamsulosin, continue efforts at staying well-hydrated and monitoring for any interval stone material passage. He has pain medication to use if needed. In the event he passes a stone before procedure is scheduled/completed, he will contact the office and let us know. He also understands he can be seen for evaluation of worsening symptomology as well. Appropriate ED follow-up instructions for similar symptoms also communicated.

## 2022-12-16 NOTE — Op Note (Signed)
His stone is not visible on KUB or fluoroscopy so the case was cancelled.

## 2022-12-17 ENCOUNTER — Encounter (HOSPITAL_BASED_OUTPATIENT_CLINIC_OR_DEPARTMENT_OTHER): Payer: Self-pay | Admitting: Urology

## 2022-12-25 DIAGNOSIS — N201 Calculus of ureter: Secondary | ICD-10-CM | POA: Diagnosis not present

## 2022-12-26 NOTE — Patient Instructions (Addendum)
SURGICAL WAITING ROOM VISITATION  Patients having surgery or a procedure may have no more than 2 support people in the waiting area - these visitors may rotate.    Children under the age of 51 must have an adult with them who is not the patient.  Due to an increase in RSV and influenza rates and associated hospitalizations, children ages 78 and under may not visit patients in [facility] hospitals.  If the patient needs to stay at the hospital during part of their recovery, the visitor guidelines for inpatient rooms apply. Pre-op nurse will coordinate an appropriate time for 1 support person to accompany patient in pre-op.  This support person may not rotate.    Please refer to the [facility] website for the visitor guidelines for Inpatients (after your surgery is over and you are in a regular room).    Your procedure is scheduled on: 01/03/23   Report to [facility] Main Entrance    Report to admitting at 9:30 AM   Call this number if you have problems the morning of surgery [phone]   Do not eat food or drink liquid :After Midnight.          If you have questions, please contact your surgeon's office.   FOLLOW BOWEL PREP AND ANY ADDITIONAL PRE OP INSTRUCTIONS YOU RECEIVED FROM YOUR SURGEON'S OFFICE!!!     Oral Hygiene is also important to reduce your risk of infection.                                    Remember - BRUSH YOUR TEETH THE MORNING OF SURGERY WITH YOUR REGULAR TOOTHPASTE  DENTURES WILL BE REMOVED PRIOR TO SURGERY PLEASE DO NOT APPLY "Poly grip" OR ADHESIVES!!!   Stop all vitamins and herbal supplements 7 days before surgery.   Take these medicines the morning of surgery with A SIP OF WATER: Tylenol, Zyrtec, Gabapentin, Omeprazole, Prednisone, Ranexa, Rosuvastatin, Tamsulosin  DO NOT TAKE ANY ORAL DIABETIC MEDICATIONS DAY OF YOUR SURGERY  How to Manage Your Diabetes Before and After Surgery  Why is it important to control my blood sugar before and  after surgery? Improving blood sugar levels before and after surgery helps healing and can limit problems. A way of improving blood sugar control is eating a healthy diet by:  Eating less sugar and carbohydrates  Increasing activity/exercise  Talking with your doctor about reaching your blood sugar goals High blood sugars (greater than 180 mg/dL) can raise your risk of infections and slow your recovery, so you will need to focus on controlling your diabetes during the weeks before surgery. Make sure that the doctor who takes care of your diabetes knows about your planned surgery including the date and location.  How do I manage my blood sugar before surgery? Check your blood sugar at least 4 times a day, starting 2 days before surgery, to make sure that the level is not too high or low. Check your blood sugar the morning of your surgery when you wake up and every 2 hours until you get to the Short Stay unit. If your blood sugar is less than 70 mg/dL, you will need to treat for low blood sugar: Do not take insulin. Treat a low blood sugar (less than 70 mg/dL) with  cup of clear juice (cranberry or apple), 4 glucose tablets, OR glucose gel. Recheck blood sugar in 15 minutes after treatment (to  make sure it is greater than 70 mg/dL). If your blood sugar is not greater than 70 mg/dL on recheck, call [phone] for further instructions. Report your blood sugar to the short stay nurse when you get to Short Stay.  If you are admitted to the hospital after surgery: Your blood sugar will be checked by the staff and you will probably be given insulin after surgery (instead of oral diabetes medicines) to make sure you have good blood sugar levels. The goal for blood sugar control after surgery is 80-180 mg/dL.   WHAT DO I DO ABOUT MY DIABETES MEDICATION?  Do not take oral diabetes medicines (pills) the morning of surgery.  THE DAY BEFORE SURGERY, take Metformin as prescribed      THE MORNING OF  SURGERY, do not take Metformin.   Reviewed and Endorsed by [facility] Patient Education Committee, August 2015                              You may not have any metal on your body including jewelry, and body piercing             Do not wear lotions, powders, cologne, or deodorant              Men may shave face and neck.   Do not bring valuables to the hospital. [place] IS NOT             RESPONSIBLE   FOR VALUABLES.   Contacts, glasses, dentures or bridgework may not be worn into surgery.  DO NOT BRING YOUR HOME MEDICATIONS TO THE HOSPITAL. PHARMACY WILL DISPENSE MEDICATIONS LISTED ON YOUR MEDICATION LIST TO YOU DURING YOUR ADMISSION IN THE HOSPITAL!    Patients discharged on the day of surgery will not be allowed to drive home.  Someone NEEDS to stay with you for the first 24 hours after anesthesia.   Special Instructions: Bring a copy of your healthcare power of attorney and living will documents the day of surgery if you haven't scanned them before.              Please read over the following fact sheets you were given: IF YOU HAVE QUESTIONS ABOUT YOUR PRE-OP INSTRUCTIONS PLEASE CALL [phone] [facility]   If you received a COVID test during your pre-op visit  it is requested that you wear a mask when out in public, stay away from anyone that may not be feeling well and notify your surgeon if you develop symptoms. If you test positive for Covid or have been in contact with anyone that has tested positive in the last 10 days please notify you surgeon.    [facility] - Preparing for Surgery Before surgery, you can play an important role.  Because skin is not sterile, your skin needs to be as free of germs as possible.  You can reduce the number of germs on your skin by washing with CHG (chlorahexidine gluconate) soap before surgery.  CHG is an antiseptic cleaner which kills germs and bonds with the skin to continue killing germs even after washing. Please DO NOT use if you have an  allergy to CHG or antibacterial soaps.  If your skin becomes reddened/irritated stop using the CHG and inform your nurse when you arrive at Short Stay. Do not shave (including legs and underarms) for at least 48 hours prior to the first CHG shower.  You may shave your face/neck.  Please follow these instructions carefully:  1.  Shower with CHG Soap the night before surgery and the  morning of surgery.  2.  If you choose to wash your hair, wash your hair first as usual with your normal  shampoo.  3.  After you shampoo, rinse your hair and body thoroughly to remove the shampoo.                             4.  Use CHG as you would any other liquid soap.  You can apply chg directly to the skin and wash.  Gently with a scrungie or clean washcloth.  5.  Apply the CHG Soap to your body ONLY FROM THE NECK DOWN.   Do   not use on face/ open                           Wound or open sores. Avoid contact with eyes, ears mouth and   genitals (private parts).                       Wash face,  Genitals (private parts) with your normal soap.             6.  Wash thoroughly, paying special attention to the area where your    surgery  will be performed.  7.  Thoroughly rinse your body with warm water from the neck down.  8.  DO NOT shower/wash with your normal soap after using and rinsing off the CHG Soap.                9.  Pat yourself dry with a clean towel.            10.  Wear clean pajamas.            11.  Place clean sheets on your bed the night of your first shower and do not  sleep with pets. Day of Surgery : Do not apply any lotions/deodorants the morning of surgery.  Please wear clean clothes to the hospital/surgery center.  FAILURE TO FOLLOW THESE INSTRUCTIONS MAY RESULT IN THE CANCELLATION OF YOUR SURGERY  PATIENT SIGNATURE_________________________________  NURSE SIGNATURE__________________________________  ________________________________________________________________________

## 2022-12-26 NOTE — Progress Notes (Addendum)
COVID Vaccine Completed: yes  Date of COVID positive in last 90 days:  PCP - Anson Fret, MD Cardiologist - Bryan Lemma, MD Pulmonologist- Cyril Mourning, MD  Cardiac CT- 03/12/22 Epic Chest x-ray - n/a EKG - 01/16/22 Epic Stress Test - 02/09/21 Epic ECHO - 04/12/21 Epic Cardiac Cath - 07/04/21 Epic Pacemaker/ICD device last checked: n/a Spinal Cord Stimulator:  Bowel Prep -   Sleep Study - yes, negative CPAP -   Fasting Blood Sugar -  Checks Blood Sugar  no checks at home  Jardaince for heart- do not hold   Last dose of GLP1 agonist-  N/A GLP1 instructions:  N/A   Last dose of SGLT-2 inhibitors-  N/A SGLT-2 instructions: N/A   Blood Thinner Instructions: Eliquis, hold 2 days Aspirin Instructions: Last Dose: 12/30/22 2000  Activity level: Can go up a flight of stairs and perform activities of daily living without stopping and without symptoms of chest pain or shortness of breath.   Anesthesia review: a fib, CHF, CAD, PVCs, ILD, OSA, DM2, anemia   Patient denies shortness of breath, fever, cough and chest pain at PAT appointment  Patient verbalized understanding of instructions that were given to them at the PAT appointment. Patient was also instructed that they will need to review over the PAT instructions again at home before surgery.

## 2022-12-26 NOTE — Progress Notes (Signed)
Please place orders for PAT appointment scheduled 12/30/22.

## 2022-12-27 ENCOUNTER — Other Ambulatory Visit: Payer: Self-pay | Admitting: Pulmonary Disease

## 2022-12-27 DIAGNOSIS — J849 Interstitial pulmonary disease, unspecified: Secondary | ICD-10-CM

## 2022-12-30 ENCOUNTER — Encounter (HOSPITAL_COMMUNITY): Payer: Self-pay

## 2022-12-30 ENCOUNTER — Encounter (HOSPITAL_COMMUNITY)
Admission: RE | Admit: 2022-12-30 | Discharge: 2022-12-30 | Disposition: A | Discharge status: Home / Self Care | Payer: Medicare Other | Source: Ambulatory Visit | Attending: Urology | Admitting: Urology

## 2022-12-30 ENCOUNTER — Other Ambulatory Visit: Payer: Self-pay

## 2022-12-30 VITALS — BP 157/83 | HR 61 | Temp 97.6°F | Resp 16 | Ht 75.0 in | Wt 211.0 lb

## 2022-12-30 DIAGNOSIS — E119 Type 2 diabetes mellitus without complications: Secondary | ICD-10-CM | POA: Insufficient documentation

## 2022-12-30 DIAGNOSIS — Z01812 Encounter for preprocedural laboratory examination: Secondary | ICD-10-CM | POA: Diagnosis not present

## 2022-12-30 DIAGNOSIS — Z01818 Encounter for other preprocedural examination: Secondary | ICD-10-CM | POA: Diagnosis present

## 2022-12-30 HISTORY — DX: Other disorders of lung: J98.4

## 2022-12-30 LAB — CBC
HCT: 42.1 % (ref 39.0–52.0)
Hemoglobin: 13.5 g/dL (ref 13.0–17.0)
MCH: 29.3 pg (ref 26.0–34.0)
MCHC: 32.1 g/dL (ref 30.0–36.0)
MCV: 91.5 fL (ref 80.0–100.0)
Platelets: 184 10*3/uL (ref 150–400)
RBC: 4.6 MIL/uL (ref 4.22–5.81)
RDW: 13.9 % (ref 11.5–15.5)
WBC: 8 10*3/uL (ref 4.0–10.5)
nRBC: 0 % (ref 0.0–0.2)

## 2022-12-30 LAB — GLUCOSE, CAPILLARY: Glucose-Capillary: 133 mg/dL — ABNORMAL HIGH (ref 70–99)

## 2022-12-30 LAB — BASIC METABOLIC PANEL
Anion gap: 10 (ref 5–15)
BUN: 12 mg/dL (ref 8–23)
CO2: 24 mmol/L (ref 22–32)
Calcium: 9.2 mg/dL (ref 8.9–10.3)
Chloride: 105 mmol/L (ref 98–111)
Creatinine, Ser: 0.89 mg/dL (ref 0.61–1.24)
GFR, Estimated: >60 mL/min (ref >60)
Glucose, Bld: 142 mg/dL — ABNORMAL HIGH (ref 70–99)
Potassium: 4 mmol/L (ref 3.5–5.1)
Sodium: 139 mmol/L (ref 135–145)

## 2022-12-30 LAB — HEMOGLOBIN A1C
Hgb A1c MFr Bld: 6.7 % — ABNORMAL HIGH (ref 4.8–5.6)
Mean Plasma Glucose: 145.59 mg/dL

## 2023-01-01 ENCOUNTER — Encounter: Payer: Self-pay | Admitting: Cardiology

## 2023-01-01 ENCOUNTER — Ambulatory Visit: Payer: Medicare Other | Attending: Cardiology | Admitting: Cardiology

## 2023-01-01 VITALS — BP 130/72 | HR 67 | Ht 75.0 in | Wt 214.6 lb

## 2023-01-01 DIAGNOSIS — I480 Paroxysmal atrial fibrillation: Secondary | ICD-10-CM | POA: Insufficient documentation

## 2023-01-01 DIAGNOSIS — I10 Essential (primary) hypertension: Secondary | ICD-10-CM | POA: Diagnosis not present

## 2023-01-01 DIAGNOSIS — Z01810 Encounter for preprocedural cardiovascular examination: Secondary | ICD-10-CM | POA: Diagnosis not present

## 2023-01-01 DIAGNOSIS — I5032 Chronic diastolic (congestive) heart failure: Secondary | ICD-10-CM | POA: Insufficient documentation

## 2023-01-01 DIAGNOSIS — I350 Nonrheumatic aortic (valve) stenosis: Secondary | ICD-10-CM | POA: Diagnosis not present

## 2023-01-01 DIAGNOSIS — D6869 Other thrombophilia: Secondary | ICD-10-CM | POA: Diagnosis not present

## 2023-01-01 DIAGNOSIS — E785 Hyperlipidemia, unspecified: Secondary | ICD-10-CM | POA: Insufficient documentation

## 2023-01-01 DIAGNOSIS — E1169 Type 2 diabetes mellitus with other specified complication: Secondary | ICD-10-CM | POA: Insufficient documentation

## 2023-01-01 DIAGNOSIS — I2510 Atherosclerotic heart disease of native coronary artery without angina pectoris: Secondary | ICD-10-CM | POA: Diagnosis not present

## 2023-01-01 DIAGNOSIS — Z9861 Coronary angioplasty status: Secondary | ICD-10-CM | POA: Diagnosis not present

## 2023-01-01 DIAGNOSIS — I25119 Atherosclerotic heart disease of native coronary artery with unspecified angina pectoris: Secondary | ICD-10-CM | POA: Insufficient documentation

## 2023-01-01 NOTE — Patient Instructions (Signed)
Medication Instructions:   OKAY TO HOLD ELIQUIS AND JARDIANCE 2 DAYS PRIOR TO PROCEDURE  *If you need a refill on your cardiac medications before your next appointment, please call your pharmacy*   Follow-Up: At [facility], you and your health needs are our priority.  As part of our continuing mission to provide you with exceptional heart care, we have created designated Provider Care Teams.  These Care Teams include your primary Cardiologist (physician) and Advanced Practice Providers (APPs -  Physician Assistants and Nurse Practitioners) who all work together to provide you with the care you need, when you need it.  We recommend signing up for the patient portal called "MyChart".  Sign up information is provided on this After Visit Summary.  MyChart is used to connect with patients for Virtual Visits (Telemedicine).  Patients are able to view lab/test results, encounter notes, upcoming appointments, etc.  Non-urgent messages can be sent to your provider as well.   To learn more about what you can do with MyChart, go to [email or web address].    Your next appointment:   6 month(s)  Provider:   Bryan Lemma, MD

## 2023-01-01 NOTE — Progress Notes (Signed)
Cardiology Office Note:  .   Date:  01/03/2023  ID:  Kenneth [name], DOB 10-19-1945, MRN [id number] PCP: Anson Fret, MD  [facility] Providers Cardiologist:  Bryan Lemma, MD     Chief Complaint  Patient presents with   Coronary Artery Disease    No angina   Atrial Fibrillation    Not noted on exam.    Patient Profile: .     Kenneth [name] is a  77 y.o. male  with a PMH reviewed who presents here for 38-month follow-up at the request of Anson Fret, MD.  CARDIAC HISTORY: 2006: Atrial Flutter - s/p Ablation 01/2014: Afib Ablation (off OAC) 06/2016: Sx of DOE -> R&LHC - mild Pulm HTN, Severe mRCA 95% (DES PCI), p-mLAD ~70% w/ FFR 0.75 (staged DES PCI). Co-dom LCx distal 60% & OM3 55%.  07/2016: Staged PCI LAD 10/11/2020 - [facility] - FFR guided DES PCI (PTCA ost D1); patent mRCA DES as well as p-m LAD. => m-d LAD 70% (after D1 - 60% ost D1) - FFR 0.75 -> Synergy DES 2.5 x 32 (post-dilation 3.7-2.6 mm), Ost D1 PTCA -> reduced to 40%. 07/04/2021 - [facility]: Significant ISR at LAD-D2 bifurcation site -> PTCA with kissing balloon technique in D2.  Restored TIMI-3 flow reduce the LAD lesion to 25% and D2 40% Stress PET 03/2022:  NORMAL PERFUSION/LOW RISK.  Normal LVEF.  Suspected reduced myocardial blood flow reserve related to microvascular disease.  No infarction or ischemia.Marland Kitchen  Resting EF 48% and stress EF 54%.  Mild LV enlargement.  Stable mosaic attenuation in the lungs. Moderate Aortic Stenosis by last echo mean AVG 26 mmHg, by Cath 33 mmHg Interstitial Lung Disease  Kenneth [name] was last seen on 06/28/2022 in follow-up after Cardiac Stress PET in December 2023.  Unfortunately during this visit he was actually very weak and confused.  He was thankfully accompanied by his wife.  He was having more bad days and good days and this was one of his worst.  He is very anxious, unsteady and weak with his gait.  Not wanting to use a walker but almost to once daily to walk with a  cane.  Very dyspneic with almost panic attack episodes of dyspnea but no chest pain.  Unfortunately he has not worn his hearing aids and so he cannot hear me either.  Seems to be relatively stable from a cardiac standpoint but just was having some almost delirium.  No real palpitations almost more anxiety driven tachycardia.  Will still losing weight with poor p.o. intake.  Having worsening memory loss. => Noted malaise and fatigue, URI symptoms, joint pains but dizziness and generalized weakness with worsening memory loss and anxiety.     Subjective  INTERVAL HPI Kenneth [name] returns here today associated with his wife who is actually the one using a walker.  He is walking with his cane.  He is notably improved from a neurologic and cognitive state today.  He is widely awake, alert, he has his hearing aids in so he can hear.  He is talkative and speaking clearly.  He is still somewhat more subdued than he had been but is otherwise in pretty good spirits.  He is not nearly as active as he once was, but is still able to walk and do some exercise.  He still has occasional coughing and mucus production with some mild URI symptoms.  Since basically coming off most of his medications he feels a lot less "  drunk and weak than he had had in the past. He has not had any PND, orthopnea or notable edema.  Sometimes has some mild swelling at the end of the day.  Denies any chest pain or pressure with rest or exertion.  Has not had any palpitations, rapid irregular heartbeats.  No syncope or near syncope.  No TIA or amaurosis fugax.  No claudication.  ROS:  Review of Systems - Negative except somewhat unsteady gait, still has some memory loss but notably better than it had been..  Stable cough but nonproductive.     Objective   Studies Reviewed: Marland Kitchen   EKG Interpretation Date/Time:  Wednesday January 01 2023 14:54:33 EDT Ventricular Rate:  67 PR Interval:  186 QRS Duration:  86 QT Interval:  404 QTC  Calculation: 426 R Axis:   15  Text Interpretation: Sinus rhythm with marked sinus arrhythmia When compared with ECG of 04-Jul-2021 16:52, No significant change was found Confirmed by Bryan Lemma ([id number]) on 01/01/2023 3:15:18 PM    Post PCI images from March 2023  ECHO 04/2021: Normal EF 60 to 65%.  No RWMA.  Mild LVH.  GR 2 DD.  Elevated LAP.  Normal RV.  Mildly elevated PAP (36 mmHg).  Mild MR.  Mild AoV calcification anD mild to moderate AAS (mean AVG 26 mmHg.  Ascending aorta 41 mm  Stress PET 03/12/2022: Normal perfusion study/low risk.  Normal LVEF and normal LVEF preserved.  Suspect reduced myocardial blood flow reserve related to microvascular disease.  No evidence of infarction or ischemia.  Resting EF 48%.  Stress EF 54%.  Mildly enlarged LV. Asc Ao 4.1 cm--stable.  Also stable mosaic attenuation in the lungs no change. CTA chest 06/01/2021 ([facility]) no acute PE or thoracic aortic dissection.  Cardiomegaly with mild pulmonary congestion  Lipid Panel 12/2021: TC 187, TG 203, HDL 60, LDL 93.  Risk Assessment/Calculations:    CHA2DS2-VASc Score = 5   This indicates a 7.2% annual risk of stroke. The patient's score is based upon: CHF History: 1 HTN History: 0 Diabetes History: 1 Stroke History: 0 Vascular Disease History: 1 Age Score: 2 Gender Score: 0   On DOAC      Physical Exam:   VS:  BP 130/72 (BP Location: Left Arm, Patient Position: Sitting, Cuff Size: Large)   Pulse 67   Ht 6\' 3"  (1.905 m)   Wt 214 lb 9.6 oz (97.3 kg)   SpO2 96%   BMI 26.82 kg/m    Wt Readings from Last 3 Encounters:  01/01/23 214 lb 9.6 oz (97.3 kg)  12/30/22 211 lb (95.7 kg)  12/16/22 207 lb (93.9 kg)    GEN: Well nourished, well developed in no acute distress; actually relatively healthy appearing.  Somewhat unsteady gait, but much more alert and awake.  Responsive.  More like his usual self.  Still little bit subdued. NECK: No JVD; No carotid bruits CARDIAC: Normal S1, S2; RRR with ectopy,  high-pitched harsh 2/6 SEM at RUSB-neck.  Otherwise no rubs or gallops. RESPIRATORY: Nonlabored.  Stable bibasilar but more right lower lobe crackles on exam from ILD.  Otherwise no wheezes rales or rhonchi. EXTREMITIES:  No edema; No deformity; much more stable gait.  Has his cane but really not using it.  Notably improved.     ASSESSMENT AND PLAN: .    Problem List Items Addressed This Visit       Cardiology Problems   CAD S/P percutaneous coronary angioplasty (Chronic)    Patent  LAD stents after ISR with stress PET in December 2023 showing no ischemia or infarction.  Probably does have some microvascular disease and therefore remains on Ranexa.  Not having any active anginal symptoms.  Plan: To allow for mild permissive hypertension and avoid fatigue he is not on any BP medications. He is on rosuvastatin may potentially require PCSK9 inhibitor. Not using as needed nitroglycerin. Ongoing Jardiance. Not on antiplatelet agent because of Eliquis  With no active angina symptoms, he should be fine for having his urologic procedures without any further testing done.        Chronic heart failure with preserved ejection fraction (HFpEF) (HCC) (Chronic)    Stable chronic HFpEF.  He is on low-dose Lasix he takes 2 times a week in addition to empagliflozin.  No BP meds as noted.      Relevant Orders   EKG 12-Lead (Completed)   Coronary artery disease involving native coronary artery with angina pectoris (HCC) - Primary (Chronic)    No active angina.  Microvascular disease noted on PET.  Now on Ranexa.  We weaned him off beta-blocker and also his diltiazem. Continue Ranexa and statin-May require PCSK9 inhibitor.       Essential hypertension (Chronic)    BP looks pretty good and stable and I would prefer for him to have a little permissive hypertension.  No longer on any BP medications.      Hypercoagulable state due to paroxysmal atrial fibrillation (HCC); CHA2DS2-VASc score 5  (Chronic)    [place] Vascor of 5.  On Eliquis.  Okay to hold Eliquis for procedures. Thankfully, he is now much more stable and not having is unsteady gait.  More safe to stay on stable dose of DOAC.      Hyperlipidemia associated with type 2 diabetes mellitus (HCC) (Chronic)    His lipids are not yet meeting goal.  I have not seen labs checked since 2023.  Should be due for follow-up labs.  With his neuropathy issues, I am reluctant to keep him on high-dose of statin.  However I suspect that he may need more aggressive therapy.  Will plan to recheck lipids based on those results, I will potentially have our [facility] pharmacist contact him about potentially getting on PCSK9 inhibitor.  He is on Jardiance and metformin for diabetes.      Moderate aortic stenosis by prior echocardiogram (Chronic)    Follow-up echo will be due in 2025.      Paroxysmal atrial fibrillation (HCC) (Chronic)    States been maintaining sinus rhythm.  With no breakthrough episodes, he continues to be off of diltiazem.  Continue Eliquis.      Relevant Orders   EKG 12-Lead (Completed)     Other   Preop cardiovascular exam    Devonne has an upcoming urology procedure for September 27.  He is not having any active cardiac symptoms, there is no need for any preoperative testing especially in light of his recent stress PET back in December. His Eliquis has already been on hold 2 days preop and I did also recommend that he hold his Jardiance as well.  No further testing required.  Low risk procedure.  He should do well from a cardiac standpoint.      S/P PTCA (percutaneous transluminal coronary angioplasty) (Chronic)    Status post DES PCI followed by PTCA of ISR.   No longer on Plavix as she is now on Eliquis.  Dispo: No follow-ups on file.  Total time spent: 35 min spent with patient + 22 min spent charting = 57 min      Signed, Marykay Lex, MD, MS Bryan Lemma, M.D.,  M.S. Interventional Cardiologist  [facility]  Pager # [phone] Phone # [phone] [address] [place], [place] [address]

## 2023-01-02 NOTE — Anesthesia Preprocedure Evaluation (Addendum)
Anesthesia Evaluation  Patient identified by MRN, date of birth, ID band Patient awake    Reviewed: Allergy & Precautions, NPO status , Patient's Chart, lab work & pertinent test results  History of Anesthesia Complications (+) PONV and history of anesthetic complications  Airway Mallampati: III  TM Distance: >3 FB Neck ROM: Full    Dental  (+) Caps, Dental Advisory Given   Pulmonary sleep apnea and Continuous Positive Airway Pressure Ventilation , former smoker   breath sounds clear to auscultation       Cardiovascular hypertension, Pt. on medications (-) angina + CAD, + Cardiac Stents and +CHF  + dysrhythmias Atrial Fibrillation + Valvular Problems/Murmurs AS  Rhythm:Regular Rate:Normal + Systolic murmurs Cath 07/04/2021 SUMMARY ? Stable two-vessel disease with exception of progression of in-stent restenosis disease in the stented portion of the LAD-2nd Diag is back to the LAD and branch with widely patent stent in the RCA. ? Culprit lesion is likely the LAD -2nd Diag bifurcation lesion which is in-stent restenosis (25% in the LAD and jailed sidebranch 70% stenosis of the 2nd Diag.  ? Successful [place] balloon PTCA of the LAD ISR with semicompliant balloon PTCA kissing balloon into the 2nd Diag side branch -> reducing lesions to 25% LAD, 30% diagonal with TIMI-3 flow preserved. ? Borderline Pulmonary Hypertension with mean PA pressure of 24 mmHg in the setting of LVEDP of 12 mmHg and PCWP pressure of 28 mmHg. ? Normal Cardiac Output-Index by Fick: 9.2-4.0. ? At least moderate aortic stenosis with mean gradient in the Cath Lab at 33 million mercury, higher than echo gradient of 26 mm reviewed. ? Borderline systolic blood pressures.       Neuro/Psych negative neurological ROS  negative psych ROS   GI/Hepatic Neg liver ROS, hiatal hernia,GERD  Medicated,,  Endo/Other  diabetes, Type 2  Lab Results      Component                 Value               Date                      HGBA1C                   6.7 (H)             12/30/2022             Renal/GU Renal diseaseLab Results      Component                Value               Date                      NA                       139                 12/30/2022                K                        4.0                 12/30/2022                CO2  24                  12/30/2022                GLUCOSE                  142 (H)             12/30/2022                BUN                      12                  12/30/2022                CREATININE               0.89                12/30/2022                CALCIUM                  9.2                 12/30/2022                GFR                      82.68               01/12/2014                EGFR                     74                  12/21/2021                GFRNONAA                 >60                 12/30/2022                Musculoskeletal  (+) Arthritis ,    Abdominal   Peds  Hematology  (+) Blood dyscrasia Lab Results      Component                Value               Date                      WBC                      8.0                 12/30/2022                HGB                      13.5                12/30/2022                HCT  42.1                12/30/2022                MCV                      91.5                12/30/2022                PLT                      184                 12/30/2022             eliquis   Anesthesia Other Findings   Reproductive/Obstetrics                              Anesthesia Physical Anesthesia Plan  ASA: 3  Anesthesia Plan: General   Post-op Pain Management: Tylenol PO (pre-op)*   Induction: Intravenous  PONV Risk Score and Plan: 4 or greater and Treatment may vary due to age or medical condition, Ondansetron, Dexamethasone, Propofol infusion and TIVA  Airway Management  Planned: LMA  Additional Equipment: None  Intra-op Plan:   Post-operative Plan: Extubation in OR  Informed Consent: I have reviewed the patients History and Physical, chart, labs and discussed the procedure including the risks, benefits and alternatives for the proposed anesthesia with the patient or authorized representative who has indicated his/her understanding and acceptance.     Dental advisory given  Plan Discussed with: CRNA and Surgeon  Anesthesia Plan Comments: (See PAT note 12/30/2022)         Anesthesia Quick Evaluation

## 2023-01-02 NOTE — Progress Notes (Addendum)
Kenneth [name] Chart Review   Case: [id number] Date/Time: 01/03/23 1130   Procedure: CYSTOSCOPY RIGHT RETROGRADE URETEROSCOPY/HOLMIUM LASER/STENT PLACEMENT (Right)   Anesthesia type: General   Pre-op diagnosis: RIGHT URETERAL STONE   Location[facility] 03 / [facility]   Surgeons: Bjorn Pippin, MD       DISCUSSION:77 y.o. former smoker with h/o PONV, HTN, PAF s/p ablation off OAC, CAD (s/p DES 2018, DES 2022, PTCA 07/04/2021), CHF, mild AS, ILD, DM II, sleep apnea, right ureteral stone scheduled for above procedure 01/03/2023 with Dr. Bjorn Pippin.   Per cardiology preoperative evaluation 12/13/2022, "According to the Revised Cardiac Risk Index (RCRI), his Perioperative Risk of Major Cardiac Event is (%): 0.9   His Functional Capacity in METs is: 7.99 according to the Duke Activity Status Index (DASI).  The patient was advised that if he develops new symptoms prior to surgery to contact our office to arrange for a follow-up visit, and he verbalized understanding. Per Dr. Herbie Baltimore may hold Eliquis 2-3 days prior to procedure.  Per office protocol, if patient is without any new symptoms or concerns at the time of their virtual visit, he/she may hold Eliquis for 2-3 days prior to procedure. Please resume Eliquis as soon as possible postprocedure, at the discretion of the surgeon.     Therefore, based on ACC/AHA guidelines, patient would be at acceptable risk for the planned procedure without further cardiovascular testing. I will route this recommendation to the requesting party via Epic fax function."  Discussed with Dr. Herbie Baltimore, pt was seen by him 01/01/2023.  Per Dr. Herbie Baltimore no changes at this visit.  Pt acceptable risk to proceed.  VS: BP (!) 157/83   Pulse 61   Temp 36.4 C (Oral)   Resp 16   Ht 6\' 3"  (1.905 m)   Wt 95.7 kg   SpO2 99%   BMI 26.37 kg/m   PROVIDERS: Anson Fret, MD is PCP   Bryan Lemma, MD is Cardiologist  LABS: Labs reviewed: Acceptable for surgery. (all labs ordered  are listed, but only abnormal results are displayed)  Labs Reviewed  HEMOGLOBIN A1C - Abnormal; Notable for the following components:      Result Value   Hgb A1c MFr Bld 6.7 (*)    All other components within normal limits  BASIC METABOLIC PANEL - Abnormal; Notable for the following components:   Glucose, Bld 142 (*)    All other components within normal limits  GLUCOSE, CAPILLARY - Abnormal; Notable for the following components:   Glucose-Capillary 133 (*)    All other components within normal limits  CBC     IMAGES:   EKG:   CV: Echo 04/12/2021  1. Left ventricular ejection fraction, by estimation, is 60 to 65%. Left  ventricular ejection fraction by 3D volume is 57 %. The left ventricle has  normal function. The left ventricle has no regional wall motion  abnormalities. There is mild left  ventricular hypertrophy. Left ventricular diastolic parameters are  consistent with Grade II diastolic dysfunction (pseudonormalization).  Elevated left ventricular end-diastolic pressure. The E/e' is 20.   2. Right ventricular systolic function is normal. The right ventricular  size is normal. There is mildly elevated pulmonary artery systolic  pressure. The estimated right ventricular systolic pressure is 36.4 mmHg.   3. The mitral valve is abnormal. Mild mitral valve regurgitation.   4. The aortic valve is tricuspid. There is mild calcification of the  aortic valve. Aortic valve regurgitation is mild. Mild aortic valve  stenosis.  Aortic regurgitation PHT measures 553 msec. Aortic valve area,  by VTI measures 1.68 cm. Aortic valve mean   gradient measures 26.0 mmHg. DI is 0.32.   5. Aortic dilatation noted. There is mild dilatation of the aortic root,  measuring 40 mm. There is mild dilatation of the ascending aorta,  measuring 41 mm.   Myocardial Perfusion 02/09/2021    The study is normal. Findings are consistent with no prior ischemia and no prior myocardial infarction. The  study is low risk.   No ST deviation was noted.   LV perfusion is normal. There is no evidence of ischemia. There is no evidence of infarction.   Left ventricular function is normal. Nuclear stress EF: 61 %. The left ventricular ejection fraction is normal (55-65%). End diastolic cavity size is normal. End systolic cavity size is normal.   Prior study available for comparison from 10/22/2016. No changes compared to prior study.   Past Medical History:  Diagnosis Date   Arthritis    CAD S/P percutaneous coronary angioplasty 06/28/2016   Cardiologist- Dr. Herbie Baltimore: 06/28/16 -- [facility] PCI (Synergy DES 2.75 x 20 mm); 07/10/16: Staged p-m LAD PCI (FFR 0.75) - DES PCI (Synergy DES 3.5 x 20 mm); 10/11/2020: mid-distal LAD 70% (FFR 0.75) DES PCI (Synergy DES 2.5 mm x 32 mm - overlaps prox-mid stent, tapered post-dilation 3.7 - 2.6 mm.;  06/2021 - Cath with severe LAD-D2 bifurcation ISR -- PTCA / kissing balloon - reduced LAD to ~25% & D2 to 40%   Chronic diastolic heart failure (HCC)    (Cardiologist: Dr. Herbie Baltimore): EF > 75% / Hyperdynamic LV Fxn - indeterminate Diastolic Fxn.   Chronic dryness of both eyes    Chronic heart failure with preserved ejection fraction (HFpEF) (HCC) 05/22/2013   Diverticulosis of colon    Epiretinal membrane    "epiretinal attachment" (06/28/2016) 10-14-2017 per pt oringinally in both , now only unilateral    GERD (gastroesophageal reflux disease)    Hiatal hernia    History of adenomatous polyp of colon    History of concussion 08/23/2017   w/o loc--- per pt no residual   History of kidney stones    History of rheumatic fever 1958   NO Evidence of Vavlular disease -> only mild Aortic Sclerosis   History of skin cancer    excision leg;  froze the left arm--- unsure BCC or SCC    Hyperlipidemia    Hypertension    ILD (interstitial lung disease) (HCC)    pulmologist-  dr Vassie Loll--  secondary to methotrexate use --- last PFTs 04-25-2015  mild restriction   Iron deficiency anemia     Mild obstructive sleep apnea    10-04-2019 per pt  had a sleep study years ago , was told no cpap recommended pt denies    Nephrolithiasis    CT 09-07-2019 bilateral nonobstructive stones   Paroxysmal atrial fibrillation (HCC) 2015   a. 11/2003 Tikosyn initiated - subsequently d/c'd;  b. 2006 RFCA for Afib @ [facility];  c. 09/2011 Echo: EF 60-65%, Gr 2 DD;  d. DCCV 10/2011 , 04/2012, 12/ 2014, & 02/ 2015;  e. RFCA 01-13-2014   Plaque psoriasis 1969   followed by rheumatologist  and [facility]ologist @ [facility] in [place]   PONV (postoperative nausea and vomiting)    Right ureteral stone    S/P drug eluting coronary stent placement    06-28-2016  x1 to mRCA;  07-10-2016 x1 to mLAD   Scarring of lung    Seasonal  allergies    Short-term memory loss    Type 2 diabetes mellitus (HCC)    followed by pcp   Ureteral calculus, right    Wears hearing aid in both ears     Past Surgical History:  Procedure Laterality Date   APPENDECTOMY  1996   ATRIAL FIBRILLATION ABLATION N/A 01/13/2014   repeat PVI, also left atrial ablation performed with successfull ablation of LA flutter by Dr Johney Frame   ATRIAL FIBRILLATION ABLATION  08/2004   Dr Delena Serve at [facility] , [place])   CARDIAC STRESS PET  03/12/2022   Normal perfusion study/low risk.  Normal LVEF and normal LVEF preserved.  Suspect reduced myocardial blood flow reserve related to microvascular disease.  No evidence of infarction or ischemia.  Resting EF 48%.  Stress EF 54%.  Mildly enlarged LV. Asc Ao 4.1 cm--stable.  Also stable mosaic attenuation in the lungs no change.   CARDIOVERSION N/A 05/22/2013   Procedure: CARDIOVERSION;  Surgeon: Hillis Range, MD;  Location: [facility] OR;  Service: Cardiovascular;  Laterality: N/A;   CARDIOVERSION N/A 10/23/2011   Procedure: CARDIOVERSION;  Surgeon: Duke Salvia, MD;  Location: [facility] CATH LAB;  Service: Cardiovascular;  Laterality: N/A;   CARDIOVERSION N/A 05/01/2012   Procedure: CARDIOVERSION;  Surgeon: Duke Salvia, MD;  Location: [facility] CATH LAB;  Service: Cardiovascular;  Laterality: N/A;   CARPAL TUNNEL RELEASE Right 1980s   CATARACT EXTRACTION W/ INTRAOCULAR LENS  IMPLANT, BILATERAL  2012  approx.   CORONARY BALLOON ANGIOPLASTY N/A 07/04/2021   Procedure: CORONARY BALLOON ANGIOPLASTY;  Surgeon: Marykay Lex, MD;  Location: [facility] INVASIVE CV LAB;  Service: Cardiovascular;; scoring balloon and an [place] balloon PTCA of LAD; with kissing balloon PTCA of LAD-D2 bifurcation stented segment reducing LAD to 20 % ISR and ostial D2 to 40%.   CORONARY PRESSURE/FFR STUDY N/A 06/28/2016   Procedure: Intravascular Pressure Wire/FFR Study;  Surgeon: Marykay Lex, MD;  Location: [facility] INVASIVE CV LAB;  Service: Cardiovascular: FFR of mLAD ~65% lesion = 0.75 post --> Staged PCI   CORONARY PRESSURE/FFR STUDY N/A 10/11/2020   Procedure: INTRAVASCULAR PRESSURE WIRE/FFR STUDY;  Surgeon: Marykay Lex, MD;  Location: [facility] INVASIVE CV LAB;  Service: Cardiovascular;  Laterality: LAD mid-distal just after D2 --> FFR 0.75 SIGNIFICANT => PCI   CORONARY STENT INTERVENTION N/A 06/28/2016   Procedure: Coronary Stent Intervention;  Surgeon: Marykay Lex, MD;  Location: [facility] INVASIVE CV LAB;  Service: Cardiovascular;  Laterality: mRCA PCI -Synergy DES 2.75 m x 20 mm (3.1 mm)   CORONARY STENT INTERVENTION N/A 07/10/2016   Procedure: Coronary Stent Intervention;  Surgeon: Marykay Lex, MD;  Location: [facility] INVASIVE CV LAB;  Service: Cardiovascular: mLAD PCI Synergy DES 3.5 mm x 20 mm)   CORONARY STENT INTERVENTION N/A 10/11/2020   Procedure: CORONARY STENT INTERVENTION;  Surgeon: Marykay Lex, MD;  Location: [facility] INVASIVE CV LAB;  Service: Cardiovascular: mid-distal LAD ~70% (FFR 0.75): SYNERGY DES 2.5 MM X 32 MM (overlaps prox-mid Stent from 07/2016) - Tapered post-dilation 3.7 mm ->2.6 mm; Jailed D2 PTCA reduced 60% to 40%.   CYSTOSCOPY WITH RETROGRADE PYELOGRAM, URETEROSCOPY AND STENT PLACEMENT Right 10/16/2017   Procedure: CYSTOSCOPY  WITH RIGHT RETROGRADE RIGHT URETEROSCOPY AND STONE BASKET EXTRACTION;  Surgeon: Bjorn Pippin, MD;  Location: [facility];  Service: Urology;  Laterality: Right;   CYSTOSCOPY WITH RETROGRADE PYELOGRAM, URETEROSCOPY AND STENT PLACEMENT Right 10/08/2019   Procedure: CYSTOSCOPY WITH URETEROSCOPY; RETROGRADE PYELOGRAM; STONE BASKET EXTRACTION;  STENT PLACEMENT;  Surgeon: Malen Gauze, MD;  Location: [facility];  Service: Urology;  Laterality: Right;  1 HR   CYSTOSCOPY WITH RETROGRADE PYELOGRAM, URETEROSCOPY AND STENT PLACEMENT Right 03/28/2020   Procedure: CYSTOSCOPY WITH RIGHT RETROGRADE PYELOGRAM, URETEROSCOPY WITH HOLMIUM LASER AND STENT PLACEMENT;  Surgeon: Bjorn Pippin, MD;  [facility];  Service: Urology;  Laterality: Right;   CYSTOSCOPY WITH RETROGRADE PYELOGRAM, URETEROSCOPY AND STENT PLACEMENT Left 10/22/2021   Procedure: CYSTOSCOPY WITH LEFT RETROGRADE URETEROSCOPY WIHT HOLMIUM LASERAND STENT PLACEMENT;  Surgeon: Bjorn Pippin, MD;  [facility];  Service: Urology;  Laterality: Left;   CYSTOSCOPY/RETROGRADE/URETEROSCOPY/STONE EXTRACTION WITH BASKET Right 10/27/2017   Procedure: CYSTOSCOPY/RETROGRADE/URETEROSCOPY/STONE EXTRACTION WITH BASKET/URETERAL STENT PLACEMENT and laser;  Surgeon: Bjorn Pippin, MD;  [facility];  Service: Urology;  Laterality: Right;   EXTRACORPOREAL SHOCK WAVE LITHOTRIPSY Left 08/10/2020   Procedure: EXTRACORPOREAL SHOCK WAVE LITHOTRIPSY (ESWL);  Surgeon: Crist Fat, MD;  Location: [facility];  Service: Urology;  Laterality: Left;   EXTRACORPOREAL SHOCK WAVE LITHOTRIPSY Right 12/16/2022   Procedure: RIGHT EXTRACORPOREAL SHOCK WAVE LITHOTRIPSY (ESWL);  Surgeon: Bjorn Pippin, MD;  Location: [facility];  Service: Urology;  Laterality: Right;   HOLMIUM LASER APPLICATION Right 10/27/2017   Procedure: POSSIBLE HOLMIUM LASER APPLICATION;  Surgeon: Bjorn Pippin, MD;  [facility];  Service:  Urology;  Laterality: Right;   HOLMIUM LASER APPLICATION Right 10/08/2019   Procedure: HOLMIUM LASER APPLICATION;  Surgeon: Malen Gauze, MD;  Location: [facility];  Service: Urology;  Laterality: Right;   LEFT HEART CATH AND CORONARY ANGIOGRAPHY  06/30/2003   Dr. Riley Kill: NO Significant/Obstructive CAD; Preseved LVEF (for Recurrent Afib)   NASAL SINUS SURGERY     x 2   NM MYOVIEW LTD  02/11/2021   Lexiscan:EF 55-65%.  Normal function.  No ischemia or infarction.  No EKG changes noted.   PROXIMAL INTERPHALANGEAL FUSION (PIP) Left 01-05-2001    dr graves   correction clawtoe and extensor tendon lengthening   RIGHT/LEFT HEART CATH AND CORONARY ANGIOGRAPHY N/A 06/28/2016   Procedure: Right/Left Heart Cath and Coronary Angiography;  Surgeon: Marykay Lex, MD;  Location: [facility] INVASIVE CV LAB;  Service: Cardiovascular: mRCA 99% (TIMI 2), mLAD ~65% (FFR 0.75), OM3 55%. mild Pulm HTN. Mod LVEDP elevation. EF 55-65%.   RIGHT/LEFT HEART CATH AND CORONARY ANGIOGRAPHY N/A 10/11/2020   Procedure: RIGHT/LEFT HEART CATH AND CORONARY ANGIOGRAPHY;  Surgeon: Marykay Lex, MD;  Location: [facility] INVASIVE CV LAB;; Patent DES in mRCA & p-mLAD. CULPRIT = m-d LAD 70% (just after D1 w/ Ost 60%) FFR 0.75 => DES PCI m-dLAD & PTCA ost D1 (overlapping prior stent distally & crossing D2 w/  Ost D2 post PTCA). RIGHT HEART CATH PRESSURES,  & CO-CI ARE NORMAL. NO PULM HTN   RIGHT/LEFT HEART CATH AND CORONARY ANGIOGRAPHY N/A 07/04/2021   Procedure: RIGHT/LEFT HEART CATH AND CORONARY ANGIOGRAPHY;  Surgeon: Marykay Lex, MD;  Location: [facility] INVASIVE CV LAB;  Service: Cardiovascular;; moderately elevated LVEDP of 20 mmHg, and PCWP-28 mmHg -> mean PAP of 24 mmHg.  Moderate AS-mean gradient 33 mmHg.  (Similar to Echo 26 mmHg); borderline low BP.  CO-CI 9.2 and 4.0.. Stable CAD W/ progression of ISR of LAD stent at D2 => PCI   ROTATOR CUFF REPAIR Bilateral last one 2014   TONSILLECTOMY  age 25   TRANSTHORACIC  ECHOCARDIOGRAM  09/29/2020   EF > 75%, Hyperdynamic. No RWMA. Mild LVH. ~ DD with mild LA dilation. Mild AI.  Normal RV size & fxn, ? Dilated IVC c/w CVP ~ 15 mHg.   TRANSTHORACIC ECHOCARDIOGRAM  04/12/2021   EF estimated 65%.  Normal LV size and function.  No RWMA.  Mild LVH.  GRII DD.  Elevated LVEDP (c/w HFpEF).  Normal RV size.  Mildly elevated PAP estimated 36 mmHg.  Mild MR.  Mild AS (mean gradient 26 mmHg.  DI 0.32.  Aortic root 40 mm, ascending aorta 41 mm.   URETEROSCOPIC LASER LITHOTRIPSY STONE EXTRACTIONS/  STENT PLACEMENT Bilateral 09-10-2007   dr Annabell Howells  [facility]   per pt has had several prior ureteroscopic stone extraction since age 11 , last one 09-10-2007   Zio patch monitor:  02/2021   Probably Sinus Rhythm: Heart rate 56-146 bpm, average 83 bpm.  Frequent PACs noted with rare isolated PVCs.  Multiple episodes of PAT/atrial runs-longest 23 seconds average heart rate 95 bpm, fastest 5 beats at a max rate of 231 bpm.  No evidence of A-fib or atrial flutter.  No prolonged or sustained SVT/PAT.    MEDICATIONS:  acetaminophen (TYLENOL) 650 MG CR tablet   Adalimumab 40 MG/0.4ML PSKT   apixaban (ELIQUIS) 5 MG TABS tablet   Biotin 1000 MCG CHEW   Calcium Carbonate-Vit D-Min (CALCIUM 1200 PO)   cetirizine (ZYRTEC) 10 MG tablet   Dorzolamide HCl-Timolol Mal PF 2-0.5 % SOLN   empagliflozin (JARDIANCE) 25 MG TABS tablet   ferrous sulfate 325 (65 FE) MG tablet   furosemide (LASIX) 20 MG tablet   gabapentin (NEURONTIN) 300 MG capsule   galantamine (RAZADYNE ER) 24 MG 24 hr capsule   HYDROmorphone (DILAUDID) 2 MG tablet   ibuprofen (ADVIL) 200 MG tablet   memantine (NAMENDA) 10 MG tablet   metFORMIN (GLUCOPHAGE-XR) 500 MG 24 hr tablet   nitroGLYCERIN (NITROSTAT) 0.4 MG SL tablet   omeprazole (PRILOSEC OTC) 20 MG tablet   Polyethyl Glycol-Propyl Glycol (SYSTANE OP)   predniSONE (DELTASONE) 5 MG tablet   ranolazine (RANEXA) 500 MG 12 hr tablet   rosuvastatin (CRESTOR) 40 MG tablet    tamsulosin (FLOMAX) 0.4 MG CAPS capsule   No current facility-administered medications for this encounter.    lidocaine (cardiac) 100 mg/18ml (XYLOCAINE) 20 MG/ML injection 2%   propofol (DIPRIVAN) 10 mg/mL bolus/IV push    [facility] [place] Ward, PA-C [facility] [phone]

## 2023-01-03 ENCOUNTER — Ambulatory Visit (HOSPITAL_COMMUNITY): Payer: Medicare Other

## 2023-01-03 ENCOUNTER — Encounter: Payer: Self-pay | Admitting: Cardiology

## 2023-01-03 ENCOUNTER — Ambulatory Visit (HOSPITAL_COMMUNITY): Payer: Medicare Other | Admitting: Physician Assistant

## 2023-01-03 ENCOUNTER — Other Ambulatory Visit: Payer: Self-pay

## 2023-01-03 ENCOUNTER — Encounter (HOSPITAL_COMMUNITY): Payer: Self-pay | Admitting: Urology

## 2023-01-03 ENCOUNTER — Ambulatory Visit (HOSPITAL_BASED_OUTPATIENT_CLINIC_OR_DEPARTMENT_OTHER): Payer: Medicare Other | Admitting: Anesthesiology

## 2023-01-03 ENCOUNTER — Ambulatory Visit (HOSPITAL_COMMUNITY)
Admission: RE | Admit: 2023-01-03 | Discharge: 2023-01-03 | Disposition: A | Discharge status: Home / Self Care | Payer: Medicare Other | Source: Ambulatory Visit | Attending: Urology | Admitting: Urology

## 2023-01-03 ENCOUNTER — Encounter (HOSPITAL_COMMUNITY)
Admission: RE | Disposition: A | Discharge status: Home / Self Care | Payer: Self-pay | Source: Ambulatory Visit | Attending: Urology

## 2023-01-03 DIAGNOSIS — Z79899 Other long term (current) drug therapy: Secondary | ICD-10-CM | POA: Insufficient documentation

## 2023-01-03 DIAGNOSIS — Z87891 Personal history of nicotine dependence: Secondary | ICD-10-CM | POA: Diagnosis not present

## 2023-01-03 DIAGNOSIS — E119 Type 2 diabetes mellitus without complications: Secondary | ICD-10-CM | POA: Diagnosis not present

## 2023-01-03 DIAGNOSIS — K449 Diaphragmatic hernia without obstruction or gangrene: Secondary | ICD-10-CM | POA: Insufficient documentation

## 2023-01-03 DIAGNOSIS — Z7901 Long term (current) use of anticoagulants: Secondary | ICD-10-CM | POA: Diagnosis not present

## 2023-01-03 DIAGNOSIS — I2510 Atherosclerotic heart disease of native coronary artery without angina pectoris: Secondary | ICD-10-CM | POA: Diagnosis not present

## 2023-01-03 DIAGNOSIS — K219 Gastro-esophageal reflux disease without esophagitis: Secondary | ICD-10-CM | POA: Insufficient documentation

## 2023-01-03 DIAGNOSIS — I110 Hypertensive heart disease with heart failure: Secondary | ICD-10-CM | POA: Insufficient documentation

## 2023-01-03 DIAGNOSIS — I4891 Unspecified atrial fibrillation: Secondary | ICD-10-CM | POA: Insufficient documentation

## 2023-01-03 DIAGNOSIS — Z7984 Long term (current) use of oral hypoglycemic drugs: Secondary | ICD-10-CM | POA: Diagnosis not present

## 2023-01-03 DIAGNOSIS — N201 Calculus of ureter: Secondary | ICD-10-CM

## 2023-01-03 DIAGNOSIS — I509 Heart failure, unspecified: Secondary | ICD-10-CM | POA: Insufficient documentation

## 2023-01-03 DIAGNOSIS — I5033 Acute on chronic diastolic (congestive) heart failure: Secondary | ICD-10-CM | POA: Diagnosis not present

## 2023-01-03 DIAGNOSIS — N202 Calculus of kidney with calculus of ureter: Secondary | ICD-10-CM | POA: Diagnosis not present

## 2023-01-03 DIAGNOSIS — I25118 Atherosclerotic heart disease of native coronary artery with other forms of angina pectoris: Secondary | ICD-10-CM | POA: Diagnosis not present

## 2023-01-03 DIAGNOSIS — G4730 Sleep apnea, unspecified: Secondary | ICD-10-CM | POA: Insufficient documentation

## 2023-01-03 DIAGNOSIS — Z01810 Encounter for preprocedural cardiovascular examination: Secondary | ICD-10-CM | POA: Insufficient documentation

## 2023-01-03 HISTORY — PX: CYSTOSCOPY/URETEROSCOPY/HOLMIUM LASER/STENT PLACEMENT: SHX6546

## 2023-01-03 LAB — GLUCOSE, CAPILLARY
Glucose-Capillary: 104 mg/dL — ABNORMAL HIGH (ref 70–99)
Glucose-Capillary: 119 mg/dL — ABNORMAL HIGH (ref 70–99)

## 2023-01-03 SURGERY — CYSTOSCOPY/URETEROSCOPY/HOLMIUM LASER/STENT PLACEMENT
Anesthesia: General | Site: Bladder | Laterality: Right

## 2023-01-03 MED ORDER — ORAL CARE MOUTH RINSE: 15.0000 mL | Freq: Once | OROMUCOSAL | Status: AC

## 2023-01-03 MED ORDER — SODIUM CHLORIDE 0.9 % IR SOLN
Status: DC | PRN
  Administered 2023-01-03: 6000 mL

## 2023-01-03 MED ORDER — DEXAMETHASONE SODIUM PHOSPHATE 10 MG/ML IJ SOLN
INTRAMUSCULAR | Status: DC | PRN
  Administered 2023-01-03: 10 mg via INTRAVENOUS

## 2023-01-03 MED ORDER — ONDANSETRON HCL 4 MG/2ML IJ SOLN
INTRAMUSCULAR | Status: AC
  Filled 2023-01-03: qty 2

## 2023-01-03 MED ORDER — INSULIN ASPART 100 UNIT/ML IJ SOLN: 0.0000 [IU] | INTRAMUSCULAR | Status: DC | PRN

## 2023-01-03 MED ORDER — DEXAMETHASONE SODIUM PHOSPHATE 10 MG/ML IJ SOLN
INTRAMUSCULAR | Status: AC
  Filled 2023-01-03: qty 1

## 2023-01-03 MED ORDER — ONDANSETRON HCL 4 MG/2ML IJ SOLN
INTRAMUSCULAR | Status: DC | PRN
  Administered 2023-01-03: 4 mg via INTRAVENOUS

## 2023-01-03 MED ORDER — LIDOCAINE 2% (20 MG/ML) 5 ML SYRINGE
INTRAMUSCULAR | Status: DC | PRN
  Administered 2023-01-03: 100 mg via INTRAVENOUS

## 2023-01-03 MED ORDER — SODIUM CHLORIDE 0.9% FLUSH: 3.0000 mL | Freq: Two times a day (BID) | INTRAVENOUS | Status: DC

## 2023-01-03 MED ORDER — PROPOFOL 10 MG/ML IV BOLUS
INTRAVENOUS | Status: DC | PRN
  Administered 2023-01-03: 140 ug/kg/min via INTRAVENOUS
  Administered 2023-01-03: 180 mg via INTRAVENOUS

## 2023-01-03 MED ORDER — FENTANYL CITRATE (PF) 100 MCG/2ML IJ SOLN
INTRAMUSCULAR | Status: AC
  Filled 2023-01-03: qty 2

## 2023-01-03 MED ORDER — CEFAZOLIN SODIUM-DEXTROSE 2-4 GM/100ML-% IV SOLN
2.0000 g | Freq: Once | INTRAVENOUS | Status: AC
  Administered 2023-01-03: 2 g via INTRAVENOUS
  Filled 2023-01-03: qty 100

## 2023-01-03 MED ORDER — FENTANYL CITRATE (PF) 100 MCG/2ML IJ SOLN
INTRAMUSCULAR | Status: DC | PRN
  Administered 2023-01-03: 100 ug via INTRAVENOUS

## 2023-01-03 MED ORDER — LIDOCAINE HCL (PF) 2 % IJ SOLN
INTRAMUSCULAR | Status: AC
  Filled 2023-01-03: qty 5

## 2023-01-03 MED ORDER — LACTATED RINGERS IV SOLN: INTRAVENOUS | Status: DC

## 2023-01-03 MED ORDER — CHLORHEXIDINE GLUCONATE 0.12 % MT SOLN
15.0000 mL | Freq: Once | OROMUCOSAL | Status: AC
  Administered 2023-01-03: 15 mL via OROMUCOSAL

## 2023-01-03 MED ORDER — IOHEXOL 300 MG/ML  SOLN
INTRAMUSCULAR | Status: DC | PRN
  Administered 2023-01-03: 10 mL via URETHRAL

## 2023-01-03 MED ORDER — PROPOFOL 10 MG/ML IV BOLUS
INTRAVENOUS | Status: AC
  Filled 2023-01-03: qty 20

## 2023-01-03 MED ORDER — PROPOFOL 1000 MG/100ML IV EMUL
INTRAVENOUS | Status: AC
  Filled 2023-01-03: qty 100

## 2023-01-03 MED ORDER — HYDROMORPHONE HCL 2 MG PO TABS: 2.0000 mg | ORAL_TABLET | Freq: Four times a day (QID) | ORAL | 0 refills | Status: DC | PRN

## 2023-01-03 SURGICAL SUPPLY — 24 items
BAG URO CATCHER STRL LF (MISCELLANEOUS) ×2 IMPLANT
BASKET STONE NCOMPASS (UROLOGICAL SUPPLIES) IMPLANT
CATH URETERAL DUAL LUMEN 10F (MISCELLANEOUS) IMPLANT
CATH URETL OPEN 5X70 (CATHETERS) IMPLANT
CLOTH BEACON ORANGE TIMEOUT ST (SAFETY) ×2 IMPLANT
EXTRACTOR STONE NITINOL NGAGE (UROLOGICAL SUPPLIES) IMPLANT
GLOVE SURG SS PI 8.0 STRL IVOR (GLOVE) ×2 IMPLANT
GOWN STRL REUS W/ TWL XL LVL3 (GOWN DISPOSABLE) ×2 IMPLANT
GOWN STRL REUS W/TWL XL LVL3 (GOWN DISPOSABLE) ×1
GUIDEWIRE STR DUAL SENSOR (WIRE) ×2 IMPLANT
IV NS IRRIG 3000ML ARTHROMATIC (IV SOLUTION) ×2 IMPLANT
KIT TURNOVER KIT A (KITS) IMPLANT
LASER FIB FLEXIVA PULSE ID 365 (Laser) IMPLANT
LASER FIB FLEXIVA PULSE ID 550 (Laser) IMPLANT
LASER FIB FLEXIVA PULSE ID 910 (Laser) IMPLANT
MANIFOLD NEPTUNE II (INSTRUMENTS) ×2 IMPLANT
PACK CYSTO (CUSTOM PROCEDURE TRAY) ×2 IMPLANT
SHEATH NAV HD 11/13X46 (SHEATH) IMPLANT
SHEATH NAVIGATOR HD 11/13X36 (SHEATH) IMPLANT
STENT URET 6FRX26 CONTOUR (STENTS) IMPLANT
TRACTIP FLEXIVA PULS ID 200XHI (Laser) IMPLANT
TRACTIP FLEXIVA PULSE ID 200 (Laser)
TUBING CONNECTING 10 (TUBING) ×2 IMPLANT
TUBING UROLOGY SET (TUBING) ×2 IMPLANT

## 2023-01-03 NOTE — Assessment & Plan Note (Signed)
Stable chronic HFpEF.  He is on low-dose Lasix he takes 2 times a week in addition to empagliflozin.  No BP meds as noted.

## 2023-01-03 NOTE — Anesthesia Procedure Notes (Signed)
Procedure Name: LMA Insertion Date/Time: 01/03/2023 11:16 AM  Performed by: Doran Clay, CRNAPre-anesthesia Checklist: Patient identified, Emergency Drugs available, Suction available, Patient being monitored and Timeout performed Patient Re-evaluated:Patient Re-evaluated prior to induction Oxygen Delivery Method: Circle system utilized Preoxygenation: Pre-oxygenation with 100% oxygen Induction Type: IV induction Ventilation: Mask ventilation without difficulty LMA: LMA inserted LMA Size: 4.0 Number of attempts: 1 Tube secured with: Tape Dental Injury: Teeth and Oropharynx as per pre-operative assessment

## 2023-01-03 NOTE — Interval H&P Note (Signed)
History and Physical Interval Note:  The stone wasn't visible at the time of attempted ESWL.  He has subsequently had recurrent symptoms and has elected to proceed with ureteroscopy.   01/03/2023 10:05 AM  Kenneth [name]  has presented today for surgery, with the diagnosis of RIGHT URETERAL STONE.  The various methods of treatment have been discussed with the patient and family. After consideration of risks, benefits and other options for treatment, the patient has consented to  Procedure(s): CYSTOSCOPY RIGHT RETROGRADE URETEROSCOPY/HOLMIUM LASER/STENT PLACEMENT (Right) as a surgical intervention.  The patient's history has been reviewed, patient examined, no change in status, stable for surgery.  I have reviewed the patient's chart and labs.  Questions were answered to the patient's satisfaction.     Bjorn Pippin

## 2023-01-03 NOTE — Assessment & Plan Note (Signed)
His lipids are not yet meeting goal.  I have not seen labs checked since 2023.  Should be due for follow-up labs.  With his neuropathy issues, I am reluctant to keep him on high-dose of statin.  However I suspect that he may need more aggressive therapy.  Will plan to recheck lipids based on those results, I will potentially have our [facility] pharmacist contact him about potentially getting on PCSK9 inhibitor.  He is on Jardiance and metformin for diabetes.

## 2023-01-03 NOTE — Discharge Instructions (Addendum)
You may remove the stent by pulling the string on Tuesday morning.  If you don't feel you can do that, please call the office.

## 2023-01-03 NOTE — Assessment & Plan Note (Signed)
Kenneth [name] has an upcoming urology procedure for September 27.  He is not having any active cardiac symptoms, there is no need for any preoperative testing especially in light of his recent stress PET back in December. His Eliquis has already been on hold 2 days preop and I did also recommend that he hold his Jardiance as well.  No further testing required.  Low risk procedure.  He should do well from a cardiac standpoint.

## 2023-01-03 NOTE — Transfer of Care (Signed)
Immediate Anesthesia Transfer of Care Note  Patient: Kenneth [name]  Procedure(s) Performed: CYSTOSCOPY RIGHT RETROGRADE URETEROSCOPY/HOLMIUM LASER/STENT PLACEMENT (Right: Bladder)  Patient Location: PACU  Anesthesia Type:General  Level of Consciousness: sedated  Airway & Oxygen Therapy: Patient Spontanous Breathing and Patient connected to face mask oxygen  Post-op Assessment: Report given to RN and Post -op Vital signs reviewed and stable  Post vital signs: Reviewed and stable  Last Vitals:  Vitals Value Taken Time  BP 127/60 01/03/23 1218  Temp    Pulse 60 01/03/23 1218  Resp 14 01/03/23 1218  SpO2 100 % 01/03/23 1218  Vitals shown include unfiled device data.  Last Pain:  Vitals:   01/03/23 0931  TempSrc: Oral  PainSc:          Complications: No notable events documented.

## 2023-01-03 NOTE — Assessment & Plan Note (Signed)
States been maintaining sinus rhythm.  With no breakthrough episodes, he continues to be off of diltiazem.  Continue Eliquis.

## 2023-01-03 NOTE — Assessment & Plan Note (Signed)
Status post DES PCI followed by PTCA of ISR.   No longer on Plavix as she is now on Eliquis.

## 2023-01-03 NOTE — Interval H&P Note (Signed)
History and Physical Interval Note: He continues to have pain.    01/03/2023 10:48 AM  Kenneth [name]  has presented today for surgery, with the diagnosis of RIGHT URETERAL STONE.  The various methods of treatment have been discussed with the patient and family. After consideration of risks, benefits and other options for treatment, the patient has consented to  Procedure(s): CYSTOSCOPY RIGHT RETROGRADE URETEROSCOPY/HOLMIUM LASER/STENT PLACEMENT (Right) as a surgical intervention.  The patient's history has been reviewed, patient examined, no change in status, stable for surgery.  I have reviewed the patient's chart and labs.  Questions were answered to the patient's satisfaction.     Bjorn Pippin

## 2023-01-03 NOTE — Assessment & Plan Note (Signed)
Follow-up echo will be due in 2025.

## 2023-01-03 NOTE — Assessment & Plan Note (Signed)
[place] Vascor of 5.  On Eliquis.  Okay to hold Eliquis for procedures. Thankfully, he is now much more stable and not having is unsteady gait.  More safe to stay on stable dose of DOAC.

## 2023-01-03 NOTE — Assessment & Plan Note (Signed)
BP looks pretty good and stable and I would prefer for him to have a little permissive hypertension.  No longer on any BP medications.

## 2023-01-03 NOTE — Assessment & Plan Note (Signed)
No active angina.  Microvascular disease noted on PET.  Now on Ranexa.  We weaned him off beta-blocker and also his diltiazem. Continue Ranexa and statin-May require PCSK9 inhibitor.

## 2023-01-03 NOTE — Assessment & Plan Note (Addendum)
Patent LAD stents after ISR with stress PET in December 2023 showing no ischemia or infarction.  Probably does have some microvascular disease and therefore remains on Ranexa.  Not having any active anginal symptoms.  Plan: To allow for mild permissive hypertension and avoid fatigue he is not on any BP medications. He is on rosuvastatin may potentially require PCSK9 inhibitor. Not using as needed nitroglycerin. Ongoing Jardiance. Not on antiplatelet agent because of Eliquis  With no active angina symptoms, he should be fine for having his urologic procedures without any further testing done.

## 2023-01-04 ENCOUNTER — Encounter (HOSPITAL_COMMUNITY): Payer: Self-pay | Admitting: Urology

## 2023-01-04 NOTE — Anesthesia Postprocedure Evaluation (Signed)
Anesthesia Post Note  Patient: NORLAN RANN  Procedure(s) Performed: CYSTOSCOPY RIGHT RETROGRADE URETEROSCOPY/HOLMIUM LASER/STENT PLACEMENT (Right: Bladder)     Patient location during evaluation: PACU Anesthesia Type: General Level of consciousness: awake and alert Pain management: pain level controlled Vital Signs Assessment: post-procedure vital signs reviewed and stable Respiratory status: spontaneous breathing, nonlabored ventilation, respiratory function stable and patient connected to nasal cannula oxygen Cardiovascular status: blood pressure returned to baseline and stable Postop Assessment: no apparent nausea or vomiting Anesthetic complications: no   No notable events documented.  Last Vitals:  Vitals:   01/03/23 1300 01/03/23 1315  BP: (!) 151/75 (!) 156/77  Pulse: (!) 57 69  Resp: (!) 22 20  Temp:  (!) 36.2 C  SpO2: 98% 97%    Last Pain:  Vitals:   01/03/23 1315  TempSrc:   PainSc: 0-No pain                 Geraldine Sandberg

## 2023-01-06 NOTE — Op Note (Signed)
Procedure: 1.  Cystoscopy with right retrograde pyelogram and interpretation. 2.  Right ureteroscopy with holmium laser application, stone extraction and placement of right double-J stent. 3.  Application of fluoroscopy.  Preop diagnosis: Right proximal ureteral and renal stones.  Postop diagnosis: Right mid ureteral stone and renal stones.  Surgeon: Dr. Bjorn Pippin.  Anesthesia: General.  Specimen: Stone fragments.  Drains: 6 French by 26 cm right contour double-J stent with tether.  EBL: None.  Complications: None.  Indications: The patient is a 76 year old male with a history recurrent urolithiasis who has a right ureteral stone that was initially proximal but an attempt at lithotripsy was unsuccessful due to inability to see the stone.  He has had persistent pain and now presents for ureteroscopy with the most recent KUB suggesting the stone is still in the proximal ureter.  He also has renal stones on CT.  Procedure: He was taken operating room was given antibiotics.  A general anesthetic was induced.  He is placed in lithotomy position and fitted with PAS hose.  His perineum and genitalia were prepped with Betadine solution he was draped in usual sterile fashion.  Cystoscopy was performed using a 21 [place] scope and 30 degree lens.  Examination revealed a normal urethra.  The external sphincter was intact.  The prostatic urethra had bilobar hyperplasia with some obstruction.  Examination of bladder revealed mild trabeculation without mucosal lesions.  Ureteral orifices were unremarkable.  The right ureteral orifice was cannulated with a 5 [place] open-ended catheter and Omnipaque was instilled.  The right retrograde pyelogram demonstrated a normal caliber distal ureter with a filling defect in the lower mid ureter consistent with a stone with some proximal dilation.  After completion of the retrograde pyelogram a sensor wire was passed through the open-ended catheter and negotiated  by the stone into the kidney.  The dual-lumen semirigid ureteroscope was then advanced alongside the wire without the need for dilation initially and the stone was visualized.  The stone was then fragmented using the 364 m holmium laser fiber on the dusting setting and the fragments were removed.  Once the ureter been cleared a 46 cm digital access sheath was then advanced to the kidney over the wire and the inner core and wire were then removed.  The dual digital flexible ureteroscope was then passed to the kidney and stones were noted in to mid calyces.  The stones were engaged with a 242 m tract tip laser fiber with the laser on the dusting setting and broken and imaginable fragments which were then removed using the engage basket a reduced sufficiently that they should pass.  Once the stones have been adequately treated the ureteroscope and sheath were removed after wire then replaced and the cystoscope was reinserted over the wire.  A 6 French by 26 cm contour double-J stent with tether was then advanced the kidney under fluoroscopic guidance.  The wire was removed, and a good coil in the kidney and a good coil in the bladder.  The bladder was drained and the cystoscope was removed leaving the stent string exiting the urethra.  The string was secured to the patient's penis.  He was taken down from lithotomy position, his anesthetic was reversed and he was moved to recovery in stable condition.  There were no complications.

## 2023-01-17 DIAGNOSIS — N132 Hydronephrosis with renal and ureteral calculous obstruction: Secondary | ICD-10-CM | POA: Diagnosis not present

## 2023-01-29 ENCOUNTER — Ambulatory Visit: Payer: Medicare Other

## 2023-01-29 ENCOUNTER — Ambulatory Visit (INDEPENDENT_AMBULATORY_CARE_PROVIDER_SITE_OTHER): Payer: Medicare Other | Admitting: Pulmonary Disease

## 2023-01-29 ENCOUNTER — Encounter: Payer: Self-pay | Admitting: Pulmonary Disease

## 2023-01-29 VITALS — BP 136/70 | HR 82 | Temp 97.5°F | Ht 75.0 in | Wt 210.4 lb

## 2023-01-29 DIAGNOSIS — R059 Cough, unspecified: Secondary | ICD-10-CM | POA: Diagnosis not present

## 2023-01-29 DIAGNOSIS — R051 Acute cough: Secondary | ICD-10-CM

## 2023-01-29 DIAGNOSIS — I700 Atherosclerosis of aorta: Secondary | ICD-10-CM | POA: Diagnosis not present

## 2023-01-29 MED ORDER — AZITHROMYCIN 250 MG PO TABS: ORAL_TABLET | ORAL | 0 refills | Status: AC

## 2023-01-29 MED ORDER — PREDNISONE 10 MG PO TABS: 10.0000 mg | ORAL_TABLET | Freq: Every day | ORAL | 0 refills | Status: AC

## 2023-01-29 NOTE — Progress Notes (Signed)
@Patient  ID: Kenneth [name], male    DOB: 1945-05-01, 77 y.o.   MRN: [id number]  Chief Complaint  Patient presents with   Acute Visit    Cough x 4 days.     Referring provider: Anson Fret, MD  HPI:   77 y.o. male history of ILD who presents with acute cough.  Multiple prior pulmonary notes reviewed.  Patient with cough for about 4 days.  Denies any sick contacts.  No measured fevers but night sweat last night.  Feels like fever broke.  Denies chills.  No really worsening shortness of breath.  Cough is intermittent, cyclical.  Coughing fits.  Productive.  Increased congestion in the chest.  No at home COVID test etc.  Reviewed most recent CT chest scan 03/2021 scattered scattered peripheral interlobular septal thickening concerning for fibrosis as well as emphysematous changes.  Questionaires / Pulmonary Flowsheets:   ACT:      No data to display          MMRC:     No data to display          Epworth:      No data to display          Tests:   FENO:  No results found for: "NITRICOXIDE"  PFT:    Latest Ref Rng & Units 04/25/2015    9:41 AM  PFT Results  FVC-Pre L 3.07   FVC-Predicted Pre % 60   FVC-Post L 3.10   FVC-Predicted Post % 61   Pre FEV1/FVC % % 83   Post FEV1/FCV % % 83   FEV1-Pre L 2.54   FEV1-Predicted Pre % 68   FEV1-Post L 2.58   DLCO uncorrected ml/min/mmHg 25.18   DLCO UNC% % 67   DLVA Predicted % 106   TLC L 5.26   TLC % Predicted % 68   RV % Predicted % 27   Personally reviewed and interpreted as moderate restriction with moderately reduced DLCO  WALK:     03/19/2021   12:15 PM 06/16/2020    9:42 AM 09/21/2019    9:55 AM 05/14/2017    9:43 AM 06/11/2016    2:30 PM 12/06/2014    4:25 PM 06/21/2014    2:37 PM  SIX MIN WALK  Supplimental Oxygen during Test? (L/min) No No No No No No No  Tech Comments: Patient walked an average pace. Patient maintained good stats. pt completed all 3 laps without difficulty.  he recovers  quickly when resting.  RA is aware. pt walked at moderate pace, maintained O2 sats  Pt. was able to walk all 3 laps at a fast pace. Denied any SOB or chest pains during walk.  Pt's lowest ambulatory sat was 97 on RA. Pt walked at a fast pace.      Imaging: Personally reviewed and as per EMR and discussion of this note DG C-Arm 1-60 Min-No Report  Result Date: 01/03/2023 Fluoroscopy was utilized by the requesting physician.  No radiographic interpretation.   DG C-Arm 1-60 Min-No Report  Result Date: 01/03/2023 Fluoroscopy was utilized by the requesting physician.  No radiographic interpretation.    Lab Results: Personally reviewed CBC    Component Value Date/Time   WBC 8.0 12/30/2022 1319   RBC 4.60 12/30/2022 1319   HGB 13.5 12/30/2022 1319   HGB 13.4 12/21/2021 1146   HCT 42.1 12/30/2022 1319   HCT 40.4 12/21/2021 1146   PLT 184 12/30/2022 1319   PLT 202 12/21/2021 1146  MCV 91.5 12/30/2022 1319   MCV 88 12/21/2021 1146   MCH 29.3 12/30/2022 1319   MCHC 32.1 12/30/2022 1319   RDW 13.9 12/30/2022 1319   RDW 13.6 12/21/2021 1146   LYMPHSABS 1.2 02/01/2021 2003   LYMPHSABS 1.3 08/06/2018 1108   MONOABS 0.7 02/01/2021 2003   EOSABS 0.1 02/01/2021 2003   EOSABS 0.0 08/06/2018 1108   BASOSABS 0.0 02/01/2021 2003   BASOSABS 0.0 08/06/2018 1108    BMET    Component Value Date/Time   NA 139 12/30/2022 1319   NA 141 12/21/2021 1146   K 4.0 12/30/2022 1319   CL 105 12/30/2022 1319   CO2 24 12/30/2022 1319   GLUCOSE 142 (H) 12/30/2022 1319   BUN 12 12/30/2022 1319   BUN 11 12/21/2021 1146   CREATININE 0.89 12/30/2022 1319   CALCIUM 9.2 12/30/2022 1319   GFRNONAA >60 12/30/2022 1319   GFRAA >60 09/07/2019 1744    BNP    Component Value Date/Time   BNP 62.3 06/20/2021 1627   BNP 19.3 02/01/2021 2003    ProBNP    Component Value Date/Time   PROBNP 82.0 09/12/2020 0950    Specialty Problems       Pulmonary Problems   Allergic rhinitis    Qualifier:  Diagnosis of  By: Thad Ranger LPN, Megan        UNSPEC ALVEOLAR&PARIETOALVEOLAR PNEUMONOPATHY    Noted in 09/2007-resolved  Replacing diagnoses that were inactivated after the 07/08/22 regulatory import      Obstructive sleep apnea    Pt reports it was mild osa, no cpap        OTHER CHRONIC SINUSITIS    Qualifier: Diagnosis of  By: Alwyn Ren MD, Chrissie Noa        ILD (interstitial lung disease) (HCC)    Favor NSIP ?related to methotrexate vs hypersensitivity pneumonitis Appears to be somewhat steroid responsive  Has issues with pulmonary function testing Prefers low-dose prednisone versus immunosuppressants      Bronchitis   Exertional dyspnea    Allergies  Allergen Reactions   Hydrocodone Shortness Of Breath    In combination with decongestants   Other Palpitations and Other (See Comments)    ALL DECONGESTANTS - CAUSE PALPITATIONS; THROWS HEART RHYTHM OUT OF BALANCE   Oxycodone Shortness Of Breath   Sudafed [Pseudoephedrine] Palpitations   Tramadol Shortness Of Breath and Rash    flushing   Lopid [Gemfibrozil] Other (See Comments)    Dizziness      Immunization History  Administered Date(s) Administered   Fluad Quad(high Dose 65+) 11/30/2018, 02/01/2020, 02/01/2021   H1N1 03/18/2008   Influenza Split 03/27/2011, 01/17/2017   Influenza Whole 02/24/2008, 01/12/2009   Influenza, High Dose Seasonal PF 01/03/2014, 01/23/2015, 01/15/2016, 12/30/2016, 01/06/2018, 01/26/2018   Influenza,inj,Quad PF,6+ Mos 01/03/2014   Influenza-Unspecified 01/28/2012, 02/08/2013, 01/23/2015, 01/21/2019   Moderna Sars-Covid-2 Vaccination 06/03/2019, 07/01/2019, 12/09/2019, 07/18/2020   Pneumococcal Conjugate-13 07/05/2013   Pneumococcal Polysaccharide-23 11/30/2018, 02/28/2020   Pneumococcal-Unspecified 01/28/2012, 01/15/2016   Tdap 06/08/2012, 02/01/2020   Zoster Recombinant(Shingrix) 11/15/2020, 02/01/2021    Past Medical History:  Diagnosis Date   Arthritis    CAD S/P percutaneous  coronary angioplasty 06/28/2016   Cardiologist- Dr. Herbie Baltimore: 06/28/16 -- [facility] PCI (Synergy DES 2.75 x 20 mm); 07/10/16: Staged p-m LAD PCI (FFR 0.75) - DES PCI (Synergy DES 3.5 x 20 mm); 10/11/2020: mid-distal LAD 70% (FFR 0.75) DES PCI (Synergy DES 2.5 mm x 32 mm - overlaps prox-mid stent, tapered post-dilation 3.7 - 2.6 mm.;  06/2021 - Cath  with severe LAD-D2 bifurcation ISR -- PTCA / kissing balloon - reduced LAD to ~25% & D2 to 40%   Chronic diastolic heart failure (HCC)    (Cardiologist: Dr. Herbie Baltimore): EF > 75% / Hyperdynamic LV Fxn - indeterminate Diastolic Fxn.   Chronic dryness of both eyes    Chronic heart failure with preserved ejection fraction (HFpEF) (HCC) 05/22/2013   Diverticulosis of colon    Epiretinal membrane    "epiretinal attachment" (06/28/2016) 10-14-2017 per pt oringinally in both , now only unilateral    GERD (gastroesophageal reflux disease)    Hiatal hernia    History of adenomatous polyp of colon    History of concussion 08/23/2017   w/o loc--- per pt no residual   History of kidney stones    History of rheumatic fever 1958   NO Evidence of Vavlular disease -> only mild Aortic Sclerosis   History of skin cancer    excision leg;  froze the left arm--- unsure BCC or SCC    Hyperlipidemia    Hypertension    ILD (interstitial lung disease) (HCC)    pulmologist-  dr Vassie Loll--  secondary to methotrexate use --- last PFTs 04-25-2015  mild restriction   Iron deficiency anemia    Mild obstructive sleep apnea    10-04-2019 per pt  had a sleep study years ago , was told no cpap recommended pt denies    Nephrolithiasis    CT 09-07-2019 bilateral nonobstructive stones   Paroxysmal atrial fibrillation (HCC) 2015   a. 11/2003 Tikosyn initiated - subsequently d/c'd;  b. 2006 RFCA for Afib @ [facility];  c. 09/2011 Echo: EF 60-65%, Gr 2 DD;  d. DCCV 10/2011 , 04/2012, 12/ 2014, & 02/ 2015;  e. RFCA 01-13-2014   Plaque psoriasis 1969   followed by rheumatologist  and [facility]ologist @ [facility] in  [place]   PONV (postoperative nausea and vomiting)    Right ureteral stone    S/P drug eluting coronary stent placement    06-28-2016  x1 to mRCA;  07-10-2016 x1 to mLAD   Scarring of lung    Seasonal allergies    Short-term memory loss    Type 2 diabetes mellitus (HCC)    followed by pcp   Ureteral calculus, right    Wears hearing aid in both ears     Tobacco History: Social History   Tobacco Use  Smoking Status Former   Types: Cigars   Quit date: 09/29/1997   Years since quitting: 25.3  Smokeless Tobacco Former   Types: Snuff, Chew  Tobacco Comments   Form smoker and chew tobacco 01/25/2021   Counseling given: Not Answered Tobacco comments: Form smoker and chew tobacco 01/25/2021   Continue to not smoke  Outpatient Encounter Medications as of 01/29/2023  Medication Sig   Adalimumab 40 MG/0.4ML PSKT Inject 40 mg into the skin every 14 (fourteen) days.    Adalimumab-bwwd 40 MG/0.4ML SOAJ Inject 40 mg into the skin every 14 (fourteen) days.   apixaban (ELIQUIS) 5 MG TABS tablet Take 1 tablet (5 mg total) by mouth 2 (two) times daily.   azithromycin (ZITHROMAX) 250 MG tablet Take 2 tablets (500 mg total) by mouth daily for 1 day, THEN 1 tablet (250 mg total) daily for 4 days.   Biotin 1000 MCG CHEW Chew 1,000 mcg by mouth in the morning.   Calcium Carbonate-Vit D-Min (CALCIUM 1200 PO) Take 1 tablet by mouth every evening.   cetirizine (ZYRTEC) 10 MG tablet Take 10 mg by mouth  in the morning.   Dorzolamide HCl-Timolol Mal PF 2-0.5 % SOLN Apply 1 drop to eye 2 (two) times daily.   empagliflozin (JARDIANCE) 25 MG TABS tablet Take 12.5 mg by mouth daily.   ferrous sulfate 325 (65 FE) MG tablet Take 325 mg by mouth every Monday, Wednesday, and Friday.   furosemide (LASIX) 20 MG tablet Take 1 tablet (20 mg total) by mouth 2 (two) times a week. (Monday and Thursday). Ok to take additional tablet as needed for weight gain of 2 pounds overnight or 5 pounds in one week.    galantamine (RAZADYNE ER) 24 MG 24 hr capsule Take 24 mg by mouth in the morning.   HYDROmorphone (DILAUDID) 2 MG tablet Take 1 tablet (2 mg total) by mouth every 6 (six) hours as needed.   ibuprofen (ADVIL) 200 MG tablet Take 200-800 mg by mouth every 6 (six) hours as needed for moderate pain.   memantine (NAMENDA) 10 MG tablet Take 20 mg by mouth at bedtime.   metFORMIN (GLUCOPHAGE-XR) 500 MG 24 hr tablet Take 500 mg by mouth at bedtime.   nitroGLYCERIN (NITROSTAT) 0.4 MG SL tablet Place 1 tablet (0.4 mg total) under the tongue every 5 (five) minutes as needed.   omeprazole (PRILOSEC OTC) 20 MG tablet Take 20 mg by mouth daily as needed (Acid reflux).   Polyethyl Glycol-Propyl Glycol (SYSTANE OP) Place 1 drop into both eyes 3 (three) times daily as needed (dry/irritated eyes.).   predniSONE (DELTASONE) 10 MG tablet Take 1 tablet (10 mg total) by mouth daily with breakfast for 10 days.   predniSONE (DELTASONE) 5 MG tablet Take 1 tablet by mouth once daily with breakfast   ranolazine (RANEXA) 500 MG 12 hr tablet Take 1 tablet (500 mg total) by mouth 2 (two) times daily.   rosuvastatin (CRESTOR) 40 MG tablet Take 1 tablet (40 mg total) by mouth daily.   tamsulosin (FLOMAX) 0.4 MG CAPS capsule Take 1 capsule by mouth once daily (Patient taking differently: Take 0.4 mg by mouth daily as needed (kidney stones).)   [DISCONTINUED] acetaminophen (TYLENOL) 650 MG CR tablet Take 1,300 mg by mouth every 8 (eight) hours as needed for pain. (Patient not taking: Reported on 01/29/2023)   Facility-Administered Encounter Medications as of 01/29/2023  Medication   lidocaine (cardiac) 100 mg/73ml (XYLOCAINE) 20 MG/ML injection 2%   propofol (DIPRIVAN) 10 mg/mL bolus/IV push     Review of Systems  Review of Systems  No chest pain exertion.  No orthopnea or PND.  Comprehensive review of systems otherwise negative. Physical Exam  BP 136/70 (BP Location: Right Arm, Patient Position: Sitting, Cuff Size: Normal)    Pulse 82   Temp (!) 97.5 F (36.4 C) (Oral)   Ht 6\' 3"  (1.905 m)   Wt 210 lb 6.4 oz (95.4 kg)   SpO2 98%   BMI 26.30 kg/m   Wt Readings from Last 5 Encounters:  01/29/23 210 lb 6.4 oz (95.4 kg)  01/03/23 214 lb 9.6 oz (97.3 kg)  01/01/23 214 lb 9.6 oz (97.3 kg)  12/30/22 211 lb (95.7 kg)  12/16/22 207 lb (93.9 kg)    BMI Readings from Last 5 Encounters:  01/29/23 26.30 kg/m  01/03/23 26.82 kg/m  01/01/23 26.82 kg/m  12/30/22 26.37 kg/m  12/16/22 25.87 kg/m     Physical Exam General: Sitting in chair, no acute distress Eyes: EOMI, no icterus Neck: Supple, no JVP appreciated sitting upright Cardiovascular: Warm, no edema appreciated Pulmonary: Mild expiratory crackles at the bases  bilaterally, good air movement, no wheeze, no rhonchi Abdomen: Nondistended, bowel sounds present MSK: No synovitis, no joint effusion Neuro: Unsteadiness of gait demonstrated nearly tripping when rising from seated position, no focal deficit Psych: Normal mood, full affect  Assessment & Plan:   Acute cough: Sounds like bronchospasm worse with deep breaths on exam today.  Lung exam clear without wheeze or crackle or significant sign of pneumonia etc.  Suspect developing bronchitis.  Likely viral induced given report of night sweats last night.  No measured fever but concern for fevers.  Chest x-ray today.  Prednisone 10 mg daily for 10 days (at 20 mg in the past developed increased eye pressures).  Azithromycin, Z-Pak.  ILD: On chronic prednisone 5 mg daily.  Increase prednisone to 10 mg for short period time as above.   Return in about 3 months (around 05/01/2023) for with Dr. Vassie Loll.   Karren Burly, MD 01/29/2023   This appointment required 40 minutes of patient care (this includes precharting, chart review, review of results, face-to-face care, etc.).

## 2023-01-29 NOTE — Patient Instructions (Signed)
Take prednisone 10 mg once a day for 10 days then return to 5 mg once daily  Take azithromycin 2 tab on day 1 then 1 tab on days 2 through 5  Chest xray today  Return to clinic in 3 months or sooner as needed with Dr. Vassie Loll

## 2023-02-10 DIAGNOSIS — H35342 Macular cyst, hole, or pseudohole, left eye: Secondary | ICD-10-CM | POA: Diagnosis not present

## 2023-02-10 DIAGNOSIS — H18513 Endothelial corneal dystrophy, bilateral: Secondary | ICD-10-CM | POA: Diagnosis not present

## 2023-02-10 DIAGNOSIS — H35373 Puckering of macula, bilateral: Secondary | ICD-10-CM | POA: Diagnosis not present

## 2023-02-10 DIAGNOSIS — M25811 Other specified joint disorders, right shoulder: Secondary | ICD-10-CM | POA: Diagnosis not present

## 2023-02-21 DIAGNOSIS — R310 Gross hematuria: Secondary | ICD-10-CM | POA: Diagnosis not present

## 2023-02-21 DIAGNOSIS — K5730 Diverticulosis of large intestine without perforation or abscess without bleeding: Secondary | ICD-10-CM | POA: Diagnosis not present

## 2023-02-21 DIAGNOSIS — N200 Calculus of kidney: Secondary | ICD-10-CM | POA: Diagnosis not present

## 2023-02-21 DIAGNOSIS — K429 Umbilical hernia without obstruction or gangrene: Secondary | ICD-10-CM | POA: Diagnosis not present

## 2023-03-04 ENCOUNTER — Other Ambulatory Visit: Payer: Self-pay | Admitting: Pulmonary Disease

## 2023-03-04 DIAGNOSIS — J849 Interstitial pulmonary disease, unspecified: Secondary | ICD-10-CM

## 2023-03-10 DIAGNOSIS — H35342 Macular cyst, hole, or pseudohole, left eye: Secondary | ICD-10-CM | POA: Diagnosis not present

## 2023-03-10 DIAGNOSIS — H40053 Ocular hypertension, bilateral: Secondary | ICD-10-CM | POA: Diagnosis not present

## 2023-03-10 DIAGNOSIS — H401131 Primary open-angle glaucoma, bilateral, mild stage: Secondary | ICD-10-CM | POA: Diagnosis not present

## 2023-03-10 DIAGNOSIS — H18513 Endothelial corneal dystrophy, bilateral: Secondary | ICD-10-CM | POA: Diagnosis not present

## 2023-03-10 DIAGNOSIS — H35373 Puckering of macula, bilateral: Secondary | ICD-10-CM | POA: Diagnosis not present

## 2023-03-13 ENCOUNTER — Telehealth: Payer: Self-pay

## 2023-03-13 NOTE — Telephone Encounter (Signed)
Error

## 2023-03-29 ENCOUNTER — Emergency Department (HOSPITAL_BASED_OUTPATIENT_CLINIC_OR_DEPARTMENT_OTHER): Payer: Medicare Other

## 2023-03-29 ENCOUNTER — Other Ambulatory Visit: Payer: Self-pay

## 2023-03-29 ENCOUNTER — Emergency Department (HOSPITAL_BASED_OUTPATIENT_CLINIC_OR_DEPARTMENT_OTHER)
Admission: EM | Admit: 2023-03-29 | Discharge: 2023-03-30 | Disposition: A | Discharge status: Home / Self Care | Payer: Medicare Other | Attending: Emergency Medicine | Admitting: Emergency Medicine

## 2023-03-29 DIAGNOSIS — R0789 Other chest pain: Secondary | ICD-10-CM | POA: Diagnosis not present

## 2023-03-29 DIAGNOSIS — R079 Chest pain, unspecified: Secondary | ICD-10-CM | POA: Diagnosis not present

## 2023-03-29 DIAGNOSIS — E119 Type 2 diabetes mellitus without complications: Secondary | ICD-10-CM | POA: Diagnosis not present

## 2023-03-29 DIAGNOSIS — I509 Heart failure, unspecified: Secondary | ICD-10-CM | POA: Insufficient documentation

## 2023-03-29 DIAGNOSIS — Z7984 Long term (current) use of oral hypoglycemic drugs: Secondary | ICD-10-CM | POA: Diagnosis not present

## 2023-03-29 DIAGNOSIS — I5032 Chronic diastolic (congestive) heart failure: Secondary | ICD-10-CM | POA: Insufficient documentation

## 2023-03-29 DIAGNOSIS — I4891 Unspecified atrial fibrillation: Secondary | ICD-10-CM | POA: Insufficient documentation

## 2023-03-29 DIAGNOSIS — E785 Hyperlipidemia, unspecified: Secondary | ICD-10-CM | POA: Diagnosis not present

## 2023-03-29 DIAGNOSIS — R002 Palpitations: Secondary | ICD-10-CM | POA: Insufficient documentation

## 2023-03-29 DIAGNOSIS — R918 Other nonspecific abnormal finding of lung field: Secondary | ICD-10-CM | POA: Diagnosis not present

## 2023-03-29 DIAGNOSIS — I2510 Atherosclerotic heart disease of native coronary artery without angina pectoris: Secondary | ICD-10-CM | POA: Diagnosis not present

## 2023-03-29 DIAGNOSIS — Z20822 Contact with and (suspected) exposure to covid-19: Secondary | ICD-10-CM | POA: Insufficient documentation

## 2023-03-29 DIAGNOSIS — Z7901 Long term (current) use of anticoagulants: Secondary | ICD-10-CM | POA: Diagnosis not present

## 2023-03-29 LAB — BASIC METABOLIC PANEL
Anion gap: 7 (ref 5–15)
BUN: 15 mg/dL (ref 8–23)
CO2: 29 mmol/L (ref 22–32)
Calcium: 10.4 mg/dL — ABNORMAL HIGH (ref 8.9–10.3)
Chloride: 103 mmol/L (ref 98–111)
Creatinine, Ser: 1 mg/dL (ref 0.61–1.24)
GFR, Estimated: >60 mL/min (ref >60)
Glucose, Bld: 100 mg/dL — ABNORMAL HIGH (ref 70–99)
Potassium: 4.1 mmol/L (ref 3.5–5.1)
Sodium: 139 mmol/L (ref 135–145)

## 2023-03-29 LAB — CBC
HCT: 39.6 % (ref 39.0–52.0)
Hemoglobin: 13.4 g/dL (ref 13.0–17.0)
MCH: 29.6 pg (ref 26.0–34.0)
MCHC: 33.8 g/dL (ref 30.0–36.0)
MCV: 87.6 fL (ref 80.0–100.0)
Platelets: 192 10*3/uL (ref 150–400)
RBC: 4.52 MIL/uL (ref 4.22–5.81)
RDW: 13.9 % (ref 11.5–15.5)
WBC: 6.7 10*3/uL (ref 4.0–10.5)
nRBC: 0 % (ref 0.0–0.2)

## 2023-03-29 LAB — TROPONIN I (HIGH SENSITIVITY): Troponin I (High Sensitivity): 10 ng/L (ref <18)

## 2023-03-29 LAB — RESP PANEL BY RT-PCR (RSV, FLU A&B, COVID)  RVPGX2
Influenza A by PCR: NEGATIVE
Influenza B by PCR: NEGATIVE
Resp Syncytial Virus by PCR: NEGATIVE
SARS Coronavirus 2 by RT PCR: NEGATIVE

## 2023-03-29 NOTE — ED Triage Notes (Signed)
Pt to triage c/o irregular heart beat with intermittent stabbing chest pain x 12 hours. Pt has Hx of ablation 2014. PT VSS on room air. EKG completed, IV established, labs drawn

## 2023-03-30 LAB — TROPONIN I (HIGH SENSITIVITY): Troponin I (High Sensitivity): 9 ng/L (ref <18)

## 2023-03-30 NOTE — Discharge Instructions (Addendum)
Kenneth [name] was seen in the Emergency Department for chest pain His EKG blood work and chest x-ray all looked okay COVID influenza RSV are all negative Unclear cause of his chest pain here today Is important that he follows up with his primary doctor and cardiologist for reevaluation He should return to the emerged part for chest pain trouble breathing or any other concerns

## 2023-03-30 NOTE — ED Provider Notes (Signed)
[place] EMERGENCY DEPARTMENT AT [facility] Provider Note   CSN: [id number] Arrival date & time: 03/29/23  2055     History  Chief Complaint  Patient presents with   Irregular Heart Beat    Kenneth [name] is a 77 y.o. male.  With a history of A-fib on Eliquis, status post ablation 2014, HFpEF, CAD, type 2 diabetes and hyperlipidemia who presents to the ED for chest pain.  Palpitations and chest pain have been on and off all day.   Patient reported that his chest pain radiated to the left upper extremity to his wife earlier tonight prompting her to bring him to the ED for further evaluation.  No shortness of breath nausea vomiting diaphoresis  HPI     Home Medications Prior to Admission medications   Medication Sig Start Date End Date Taking? Authorizing Provider  Adalimumab 40 MG/0.4ML PSKT Inject 40 mg into the skin every 14 (fourteen) days.     [provider]  Adalimumab-bwwd 40 MG/0.4ML SOAJ Inject 40 mg into the skin every 14 (fourteen) days. 07/02/22   [provider]  apixaban (ELIQUIS) 5 MG TABS tablet Take 1 tablet (5 mg total) by mouth 2 (two) times daily. 01/12/21   Fenton, Clint R, PA  Biotin 1000 MCG CHEW Chew 1,000 mcg by mouth in the morning.    [provider]  Calcium Carbonate-Vit D-Min (CALCIUM 1200 PO) Take 1 tablet by mouth every evening.    [provider]  cetirizine (ZYRTEC) 10 MG tablet Take 10 mg by mouth in the morning.    [provider]  Dorzolamide HCl-Timolol Mal PF 2-0.5 % SOLN Apply 1 drop to eye 2 (two) times daily.    [provider]  empagliflozin (JARDIANCE) 25 MG TABS tablet Take 12.5 mg by mouth daily. 12/26/21   [provider]  ferrous sulfate 325 (65 FE) MG tablet Take 325 mg by mouth every Monday, Wednesday, and Friday.    Ronney Asters, NP  furosemide (LASIX) 20 MG tablet Take 1 tablet (20 mg total) by mouth 2 (two) times a week. (Monday and Thursday). Ok to take  additional tablet as needed for weight gain of 2 pounds overnight or 5 pounds in one week. 12/24/21 02/17/23  Marykay Lex, MD  galantamine (RAZADYNE ER) 24 MG 24 hr capsule Take 24 mg by mouth in the morning. 08/30/20   [provider]  HYDROmorphone (DILAUDID) 2 MG tablet Take 1 tablet (2 mg total) by mouth every 6 (six) hours as needed. 01/03/23   Bjorn Pippin, MD  ibuprofen (ADVIL) 200 MG tablet Take 200-800 mg by mouth every 6 (six) hours as needed for moderate pain.    [provider]  memantine (NAMENDA) 10 MG tablet Take 20 mg by mouth at bedtime. 08/30/20   [provider]  metFORMIN (GLUCOPHAGE-XR) 500 MG 24 hr tablet Take 500 mg by mouth at bedtime.    [provider]  nitroGLYCERIN (NITROSTAT) 0.4 MG SL tablet Place 1 tablet (0.4 mg total) under the tongue every 5 (five) minutes as needed. 07/04/21   Arty Baumgartner, NP  omeprazole (PRILOSEC OTC) 20 MG tablet Take 20 mg by mouth daily as needed (Acid reflux).    [provider]  Polyethyl Glycol-Propyl Glycol (SYSTANE OP) Place 1 drop into both eyes 3 (three) times daily as needed (dry/irritated eyes.).    [provider]  predniSONE (DELTASONE) 5 MG tablet Take 1 tablet by mouth once daily with  breakfast 03/07/23   Oretha Milch, MD  ranolazine (RANEXA) 500 MG 12 hr tablet Take 1 tablet (500 mg total) by mouth 2 (two) times daily. 11/11/22   Marykay Lex, MD  rosuvastatin (CRESTOR) 40 MG tablet Take 1 tablet (40 mg total) by mouth daily. 12/28/21   Marykay Lex, MD  tamsulosin (FLOMAX) 0.4 MG CAPS capsule Take 1 capsule by mouth once daily Patient taking differently: Take 0.4 mg by mouth daily as needed (kidney stones). 03/13/21   McKenzie, Mardene Celeste, MD      Allergies    Hydrocodone, Other, Oxycodone, Sudafed [pseudoephedrine], Tramadol, and Lopid [gemfibrozil]    Review of Systems   Review of Systems  Physical Exam Updated Vital Signs BP 136/69   Pulse 67   Temp 97.9  F (36.6 C)   Resp (!) 22   Wt 95.4 kg   SpO2 99%   BMI 26.29 kg/m  Physical Exam Vitals and nursing note reviewed.  HENT:     Head: Normocephalic and atraumatic.  Eyes:     Pupils: Pupils are equal, round, and reactive to light.  Cardiovascular:     Rate and Rhythm: Normal rate and regular rhythm.  Pulmonary:     Effort: Pulmonary effort is normal.     Breath sounds: Normal breath sounds.  Abdominal:     Palpations: Abdomen is soft.     Tenderness: There is no abdominal tenderness.  Skin:    General: Skin is warm and dry.  Neurological:     Mental Status: He is alert.  Psychiatric:        Mood and Affect: Mood normal.     ED Results / Procedures / Treatments   Labs (all labs ordered are listed, but only abnormal results are displayed) Labs Reviewed  BASIC METABOLIC PANEL - Abnormal; Notable for the following components:      Result Value   Glucose, Bld 100 (*)    Calcium 10.4 (*)    All other components within normal limits  RESP PANEL BY RT-PCR (RSV, FLU A&B, COVID)  RVPGX2  CBC  TROPONIN I (HIGH SENSITIVITY)  TROPONIN I (HIGH SENSITIVITY)    EKG EKG Interpretation Date/Time:  Saturday March 29 2023 21:05:49 EST Ventricular Rate:  72 PR Interval:  182 QRS Duration:  76 QT Interval:  392 QTC Calculation: 429 R Axis:   38  Text Interpretation: Sinus rhythm with marked sinus arrhythmia Otherwise normal ECG When compared with ECG of 01-Jan-2023 14:54, No significant change was found Confirmed by Estelle June [phone]) on 03/29/2023 10:40:35 PM  Radiology DG Chest Port 1 View Result Date: 03/29/2023 CLINICAL DATA:  Atrial fibrillation EXAM: PORTABLE CHEST 1 VIEW COMPARISON:  01/29/2023 FINDINGS: Heart and mediastinal contours within normal limits. Diffuse interstitial prominence throughout the lungs. No effusions. No acute bony abnormality. IMPRESSION: Diffuse interstitial prominence throughout the lungs could reflect chronic lung disease or atypical/viral  infection. Electronically Signed   By: Charlett Nose M.D.   On: 03/29/2023 22:06    Procedures Procedures    Medications Ordered in ED Medications - No data to display  ED Course/ Medical Decision Making/ A&P Clinical Course as of 03/30/23 0013  Sun Mar 30, 2023  0005 Initial laboratory workup unremarkable.  COVID RSV influenza all negative.  Chest x-ray shows suggestion of chronic lung markings versus viral infection.  No shortness of breath.  Initial sensitive troponin of 10 with delta troponin of 9.  Low suspicion for ACS.  Patient has remained stable  and is appropriate for discharge with PCP and cardiology follow-up [MP]    Clinical Course User Index [MP] Royanne Foots, DO                                 Medical Decision Making 77 year old male with extensive cardiac history as above presenting for chest pain.  Episodic chest pain started earlier today.  Radiates through left upper extremity and associated palpitations.  Afebrile and normotensive here in the ED.  Initial ECG shows normal sinus rhythm but he appears to be in and out of A-fib on the monitor.  Anticoagulated with Eliquis.  Benign physical exam.  Differential diagnosis includes ACS, dysrhythmia, viral respiratory illness, pneumonia, anemia and electrolyte balance.  Will obtain high-sensitivity troponin, EKG, laboratory workup and chest x-ray and continue monitor on telemetry  Amount and/or Complexity of Data Reviewed Labs: ordered. Radiology: ordered.           Final Clinical Impression(s) / ED Diagnoses Final diagnoses:  Chest pain, unspecified type    Rx / DC Orders ED Discharge Orders     None         Royanne Foots, DO 03/30/23 0013

## 2023-03-30 NOTE — ED Provider Notes (Incomplete)
[place] EMERGENCY DEPARTMENT AT [facility] Provider Note   CSN: [id number] Arrival date & time: 03/29/23  2055     History {Add pertinent medical, surgical, social history, OB history to HPI:1} Chief Complaint  Patient presents with  . Irregular Heart Beat    Kenneth [name] is a 77 y.o. male.  With a history of A-fib on Eliquis, status post ablation 2014, HFpEF, CAD, type 2 diabetes and hyperlipidemia who presents to the ED for chest pain.  Palpitations and chest pain have been on and off all day.   Patient reported that his chest pain radiated to the left upper extremity to his wife earlier tonight prompting her to bring him to the ED for further evaluation.  No shortness of breath nausea vomiting diaphoresis  HPI     Home Medications Prior to Admission medications   Medication Sig Start Date End Date Taking? Authorizing Provider  Adalimumab 40 MG/0.4ML PSKT Inject 40 mg into the skin every 14 (fourteen) days.     [provider]  Adalimumab-bwwd 40 MG/0.4ML SOAJ Inject 40 mg into the skin every 14 (fourteen) days. 07/02/22   [provider]  apixaban (ELIQUIS) 5 MG TABS tablet Take 1 tablet (5 mg total) by mouth 2 (two) times daily. 01/12/21   Fenton, Clint R, PA  Biotin 1000 MCG CHEW Chew 1,000 mcg by mouth in the morning.    [provider]  Calcium Carbonate-Vit D-Min (CALCIUM 1200 PO) Take 1 tablet by mouth every evening.    [provider]  cetirizine (ZYRTEC) 10 MG tablet Take 10 mg by mouth in the morning.    [provider]  Dorzolamide HCl-Timolol Mal PF 2-0.5 % SOLN Apply 1 drop to eye 2 (two) times daily.    [provider]  empagliflozin (JARDIANCE) 25 MG TABS tablet Take 12.5 mg by mouth daily. 12/26/21   [provider]  ferrous sulfate 325 (65 FE) MG tablet Take 325 mg by mouth every Monday, Wednesday, and Friday.    Ronney Asters, NP  furosemide (LASIX) 20 MG tablet Take 1 tablet (20 mg  total) by mouth 2 (two) times a week. (Monday and Thursday). Ok to take additional tablet as needed for weight gain of 2 pounds overnight or 5 pounds in one week. 12/24/21 02/17/23  Marykay Lex, MD  galantamine (RAZADYNE ER) 24 MG 24 hr capsule Take 24 mg by mouth in the morning. 08/30/20   [provider]  HYDROmorphone (DILAUDID) 2 MG tablet Take 1 tablet (2 mg total) by mouth every 6 (six) hours as needed. 01/03/23   Bjorn Pippin, MD  ibuprofen (ADVIL) 200 MG tablet Take 200-800 mg by mouth every 6 (six) hours as needed for moderate pain.    [provider]  memantine (NAMENDA) 10 MG tablet Take 20 mg by mouth at bedtime. 08/30/20   [provider]  metFORMIN (GLUCOPHAGE-XR) 500 MG 24 hr tablet Take 500 mg by mouth at bedtime.    [provider]  nitroGLYCERIN (NITROSTAT) 0.4 MG SL tablet Place 1 tablet (0.4 mg total) under the tongue every 5 (five) minutes as needed. 07/04/21   Arty Baumgartner, NP  omeprazole (PRILOSEC OTC) 20 MG tablet Take 20 mg by mouth daily as needed (Acid reflux).    [provider]  Polyethyl Glycol-Propyl Glycol (SYSTANE OP) Place 1 drop into both eyes 3 (three) times daily as needed (dry/irritated eyes.).    [provider]  predniSONE (DELTASONE) 5 MG  tablet Take 1 tablet by mouth once daily with breakfast 03/07/23   Oretha Milch, MD  ranolazine (RANEXA) 500 MG 12 hr tablet Take 1 tablet (500 mg total) by mouth 2 (two) times daily. 11/11/22   Marykay Lex, MD  rosuvastatin (CRESTOR) 40 MG tablet Take 1 tablet (40 mg total) by mouth daily. 12/28/21   Marykay Lex, MD  tamsulosin (FLOMAX) 0.4 MG CAPS capsule Take 1 capsule by mouth once daily Patient taking differently: Take 0.4 mg by mouth daily as needed (kidney stones). 03/13/21   McKenzie, Mardene Celeste, MD      Allergies    Hydrocodone, Other, Oxycodone, Sudafed [pseudoephedrine], Tramadol, and Lopid [gemfibrozil]    Review of Systems   Review of  Systems  Physical Exam Updated Vital Signs BP 136/69   Pulse 67   Temp 97.9 F (36.6 C)   Resp (!) 22   Wt 95.4 kg   SpO2 99%   BMI 26.29 kg/m  Physical Exam  ED Results / Procedures / Treatments   Labs (all labs ordered are listed, but only abnormal results are displayed) Labs Reviewed  BASIC METABOLIC PANEL - Abnormal; Notable for the following components:      Result Value   Glucose, Bld 100 (*)    Calcium 10.4 (*)    All other components within normal limits  RESP PANEL BY RT-PCR (RSV, FLU A&B, COVID)  RVPGX2  CBC  TROPONIN I (HIGH SENSITIVITY)  TROPONIN I (HIGH SENSITIVITY)    EKG EKG Interpretation Date/Time:  Saturday March 29 2023 21:05:49 EST Ventricular Rate:  72 PR Interval:  182 QRS Duration:  76 QT Interval:  392 QTC Calculation: 429 R Axis:   38  Text Interpretation: Sinus rhythm with marked sinus arrhythmia Otherwise normal ECG When compared with ECG of 01-Jan-2023 14:54, No significant change was found Confirmed by Estelle June [phone]) on 03/29/2023 10:40:35 PM  Radiology DG Chest Port 1 View Result Date: 03/29/2023 CLINICAL DATA:  Atrial fibrillation EXAM: PORTABLE CHEST 1 VIEW COMPARISON:  01/29/2023 FINDINGS: Heart and mediastinal contours within normal limits. Diffuse interstitial prominence throughout the lungs. No effusions. No acute bony abnormality. IMPRESSION: Diffuse interstitial prominence throughout the lungs could reflect chronic lung disease or atypical/viral infection. Electronically Signed   By: Charlett Nose M.D.   On: 03/29/2023 22:06    Procedures Procedures  {Document cardiac monitor, telemetry assessment procedure when appropriate:1}  Medications Ordered in ED Medications - No data to display  ED Course/ Medical Decision Making/ A&P   {   Click here for ABCD2, HEART and other calculatorsREFRESH Note before signing :1}                              Medical Decision Making Amount and/or Complexity of Data  Reviewed Labs: ordered. Radiology: ordered.   ***  {Document critical care time when appropriate:1} {Document review of labs and clinical decision tools ie heart score, Chads2Vasc2 etc:1}  {Document your independent review of radiology images, and any outside records:1} {Document your discussion with family members, caretakers, and with consultants:1} {Document social determinants of health affecting pt's care:1} {Document your decision making why or why not admission, treatments were needed:1} Final Clinical Impression(s) / ED Diagnoses Final diagnoses:  None    Rx / DC Orders ED Discharge Orders     None

## 2023-05-05 DIAGNOSIS — R351 Nocturia: Secondary | ICD-10-CM | POA: Diagnosis not present

## 2023-05-05 DIAGNOSIS — N401 Enlarged prostate with lower urinary tract symptoms: Secondary | ICD-10-CM | POA: Diagnosis not present

## 2023-05-05 DIAGNOSIS — N200 Calculus of kidney: Secondary | ICD-10-CM | POA: Diagnosis not present

## 2023-05-05 DIAGNOSIS — R310 Gross hematuria: Secondary | ICD-10-CM | POA: Diagnosis not present

## 2023-05-14 ENCOUNTER — Ambulatory Visit (HOSPITAL_BASED_OUTPATIENT_CLINIC_OR_DEPARTMENT_OTHER): Payer: Medicare Other | Admitting: Pulmonary Disease

## 2023-05-14 ENCOUNTER — Encounter (HOSPITAL_BASED_OUTPATIENT_CLINIC_OR_DEPARTMENT_OTHER): Payer: Self-pay | Admitting: Pulmonary Disease

## 2023-05-14 ENCOUNTER — Ambulatory Visit (HOSPITAL_COMMUNITY): Payer: Medicare Other | Attending: Pulmonary Disease

## 2023-05-14 VITALS — BP 124/80 | HR 76 | Ht 75.0 in | Wt 206.8 lb

## 2023-05-14 DIAGNOSIS — R011 Cardiac murmur, unspecified: Secondary | ICD-10-CM

## 2023-05-14 DIAGNOSIS — I350 Nonrheumatic aortic (valve) stenosis: Secondary | ICD-10-CM | POA: Insufficient documentation

## 2023-05-14 DIAGNOSIS — G3184 Mild cognitive impairment, so stated: Secondary | ICD-10-CM | POA: Diagnosis not present

## 2023-05-14 DIAGNOSIS — J849 Interstitial pulmonary disease, unspecified: Secondary | ICD-10-CM

## 2023-05-14 LAB — ECHOCARDIOGRAM COMPLETE
AR max vel: 0.81 cm2
AV Area VTI: 0.88 cm2
AV Area mean vel: 0.78 cm2
AV Mean grad: 42 mm[Hg]
AV Peak grad: 56 mm[Hg]
Ao pk vel: 3.74 m/s
Area-P 1/2: 3.61 cm2
Height: 75 in
P 1/2 time: 665 ms
S' Lateral: 3 cm
Weight: 3308.8 [oz_av]

## 2023-05-14 NOTE — Patient Instructions (Signed)
 Fibrosis appears stable  X HRCT chest

## 2023-05-14 NOTE — Progress Notes (Signed)
 Subjective:    Patient ID: Kenneth [name], male    DOB: 21-Mar-1946, 78 y.o.   MRN: [id number]  HPI  78 yo man with  steroid responsive ILD , probable NSIP.  He was on methotrexate  in the remote past. He  responded well to steroids and is maintained on low-dose about 5 mg of prednisone .  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 [employer].    PMH - CAD, 10/2020  left heart cath s/p PCI to LAD , other stents were patent. -atrial fibrillation for 15 years s/p atrial ablation 01/2014 . He takes flecainide  if his heart rate gets too high.  -mod AS -dementia? , PTSD For psoriatic arthritis, he was on Enbrel and methotrexate  weekly for 3 years ,  then on Humira.  Now on adalimumab   -Wife has CIDP and has made a remarkable recovery to ambulate with a rollator  Accompanied by his wife today who corroborates history Discussed the use of AI scribe software for clinical note transcription with the patient, who gave verbal consent to proceed.  History of Present Illness   The patient, with a history of ILD and Fibrotic NSIP, presents with occasional episodes of difficulty breathing. The patient has been maintained on a low dose of 5mg   prednisone , which is increased during episodes of increased respiratory distress. The patient's spouse reports that the patient has lost about seventy pounds over the past three years, a weight loss that has not stopped. 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 The patient's spouse also mentions that the patient has short term memory loss, but no further details are provided about this issue.     He had an episode of acute cough in  October and received prednisone  20 mg for 2 weeks chest x-ray was reviewed which showed interstitial changes as prior   Significant tests/ events reviewed CT angiogram 09/2007 which shows bilateral groundglass opacities but a repeat CT in 10/2007 and shows resolution of these infiltrates.  01/27/2014 HRCT - patchy areas of very mild ground-glass attenuation and subpleural reticulation.  CHest HRCT 04/2016 >> slight worse , NSIP pattern HRCT 05/2017 unchanged NSIP, 2-4 mm new  benign nodules   HRCT 10/2019 >> likely NSIP, unchanged from 2019   HRCT 03/2021 >> stable ILD , stable 4.1 cm asc TAA  PET CT chest 03/2022 mosaic attenuation of lungs       PFT 04/2015 >> ratio nml, FVC 61%, TLC 68%, DLCO 67%      LHC 10/2020 LAD stent was placed, other stents patent   09/2020 echo LVEF more than 35%, hyperdynamic, moderate aortic stenosis, dilated LA, normal RVSP  Review of Systems neg for any significant sore throat, dysphagia, itching, sneezing, nasal congestion or excess/ purulent secretions, fever, chills, sweats, unintended wt loss, pleuritic or exertional cp, hempoptysis, orthopnea pnd or change in chronic leg swelling. Also denies presyncope, palpitations, heartburn, abdominal pain, nausea, vomiting, diarrhea or change in bowel or urinary habits, dysuria,hematuria, rash, arthralgias, visual complaints, headache, numbness weakness or ataxia.     Objective:   Physical Exam  Gen. Pleasant, well-nourished, in no distress ENT - no thrush, no pallor/icterus,no post nasal drip Neck: No JVD, no  thyromegaly, no carotid bruits Lungs: no use of accessory muscles, no dullness to percussion, BB dry rales no rhonchi  Cardiovascular: Rhythm regular, heart sounds  normal, no murmurs or gallops, no peripheral edema Musculoskeletal: No deformities, no cyanosis or clubbing        Assessment & Plan:    Assessment and Plan    Fibrotic NSIP (Nonspecific Interstitial Pneumonia) Chronic fibrotic NSIP  with intermittent exacerbations. Maintained on prednisone  5 mg daily, with temporary increases during exacerbations. Recent exacerbation required 20 mg, leading to elevated intraocular pressure but no glaucoma. Reports erratic breathing and exertion-based dyspnea. No current cough. Refuses immunosuppressants. Discussed long-term prednisone  risks, including bone and eye complications, and rationale for current dose to avoid severe exacerbations. - Order lung scan to assess fibrosis - Continue prednisone  5 mg daily - Increase prednisone  by 5-10 mg during exacerbations as needed - Schedule lung scan at [facility] radiology - Follow up in six months  Weight Loss Unintentional weight loss of approximately 70 pounds over three years, with some dietary changes such as reducing bread intake. - Monitor weight and nutritional status  Heart Murmur Heart murmur likely related to rheumatic fever. Noted during examination. Prior echo 04/2021 shows mild AS. - Repeat echo  Short-term Memory Loss Significant short-term memory loss, requiring reminders for daily tasks. Long-term memory remains intact. - Monitor cognitive function  General Health Maintenance Generally active and engaged in various activities, preventing depression. Supportive spouse assists with medication management and daily activities. - Encourage continued physical activity and engagement in hobbies  Follow-up - Schedule follow-up appointment in six months - Call if any new symptoms or concerns arise.

## 2023-05-15 ENCOUNTER — Telehealth: Payer: Self-pay | Admitting: Cardiology

## 2023-05-15 NOTE — Telephone Encounter (Signed)
 Wife is calling to get harding to look at patient aortic valve blockage. States the dr sent the results on patient chart. Please advise

## 2023-05-16 NOTE — Telephone Encounter (Signed)
 Called and spoke to patient's wife. Verified name and DOB. She is calling concerning patient's ECHO results. She has questions about findings on his ECHO. She wants to know how severe the stenosis is and what is going to be done. Please advise.

## 2023-05-16 NOTE — Telephone Encounter (Signed)
 Patient's wife is calling back regarding this due to being disappointed that the office did not call her back yesterday in regards to this.   I advised pt's spouse that RN, Reena is not here today.   She is requesting a callback today to at least confirm the CT results are viewable by the office so she knows what's going on.   Please advise.

## 2023-05-16 NOTE — Telephone Encounter (Signed)
 Just spent 30 minutes on the telephone with Kenneth [name] and his wife discussing the results of the echocardiogram and his symptoms.  Looking at my schedule I do not have much availability but since we spent so much time talking today my plan is to discuss the echocardiogram with the structural heart team to see what their thoughts are.  Will then do another virtual visit scheduled for 330 on February 11 and we can then formalize plans from which I can then order any necessary tests or studies going forward.  We will keep that in March clinic visit with me as an in person visit.  Alm Clay, MD

## 2023-05-20 ENCOUNTER — Ambulatory Visit: Payer: Medicare Other | Attending: Cardiology | Admitting: Cardiology

## 2023-05-20 ENCOUNTER — Telehealth: Payer: Self-pay | Admitting: *Deleted

## 2023-05-20 ENCOUNTER — Encounter: Payer: Self-pay | Admitting: Cardiology

## 2023-05-20 DIAGNOSIS — I110 Hypertensive heart disease with heart failure: Secondary | ICD-10-CM

## 2023-05-20 DIAGNOSIS — I350 Nonrheumatic aortic (valve) stenosis: Secondary | ICD-10-CM | POA: Diagnosis not present

## 2023-05-20 DIAGNOSIS — E785 Hyperlipidemia, unspecified: Secondary | ICD-10-CM | POA: Diagnosis not present

## 2023-05-20 DIAGNOSIS — L400 Psoriasis vulgaris: Secondary | ICD-10-CM | POA: Diagnosis not present

## 2023-05-20 DIAGNOSIS — D6869 Other thrombophilia: Secondary | ICD-10-CM | POA: Diagnosis not present

## 2023-05-20 DIAGNOSIS — I10 Essential (primary) hypertension: Secondary | ICD-10-CM

## 2023-05-20 DIAGNOSIS — E1169 Type 2 diabetes mellitus with other specified complication: Secondary | ICD-10-CM | POA: Diagnosis not present

## 2023-05-20 DIAGNOSIS — L820 Inflamed seborrheic keratosis: Secondary | ICD-10-CM | POA: Diagnosis not present

## 2023-05-20 DIAGNOSIS — E119 Type 2 diabetes mellitus without complications: Secondary | ICD-10-CM

## 2023-05-20 DIAGNOSIS — R0609 Other forms of dyspnea: Secondary | ICD-10-CM | POA: Diagnosis not present

## 2023-05-20 DIAGNOSIS — J849 Interstitial pulmonary disease, unspecified: Secondary | ICD-10-CM

## 2023-05-20 DIAGNOSIS — D225 Melanocytic nevi of trunk: Secondary | ICD-10-CM | POA: Diagnosis not present

## 2023-05-20 DIAGNOSIS — I2510 Atherosclerotic heart disease of native coronary artery without angina pectoris: Secondary | ICD-10-CM

## 2023-05-20 DIAGNOSIS — L810 Postinflammatory hyperpigmentation: Secondary | ICD-10-CM | POA: Diagnosis not present

## 2023-05-20 DIAGNOSIS — Z85828 Personal history of other malignant neoplasm of skin: Secondary | ICD-10-CM | POA: Diagnosis not present

## 2023-05-20 DIAGNOSIS — D500 Iron deficiency anemia secondary to blood loss (chronic): Secondary | ICD-10-CM | POA: Diagnosis not present

## 2023-05-20 DIAGNOSIS — I480 Paroxysmal atrial fibrillation: Secondary | ICD-10-CM

## 2023-05-20 DIAGNOSIS — L821 Other seborrheic keratosis: Secondary | ICD-10-CM | POA: Diagnosis not present

## 2023-05-20 DIAGNOSIS — I25119 Atherosclerotic heart disease of native coronary artery with unspecified angina pectoris: Secondary | ICD-10-CM

## 2023-05-20 DIAGNOSIS — I5032 Chronic diastolic (congestive) heart failure: Secondary | ICD-10-CM | POA: Diagnosis not present

## 2023-05-20 DIAGNOSIS — Z9861 Coronary angioplasty status: Secondary | ICD-10-CM

## 2023-05-20 NOTE — Assessment & Plan Note (Signed)
Chronic HFpEF follow-up not sure how much of a role heart failure versus his lung disease plays in his dyspnea.  Now also having chest discomfort which is somewhat concerning. No complaints of PND, orthopnea, or edema.  Which would suggest that he is euvolemic.  He only takes Lasix 20 mg twice a week in addition to Jardiance 12.5 mg daily. -Continue current regimen. -Assess filling pressures and right heart cath pressures on planned catheterization.

## 2023-05-20 NOTE — Assessment & Plan Note (Signed)
He seems to be maintaining sinus rhythm on no medications.  Thankfully does not appear to be having any breakthrough spells.  We can check a precath EKG upon arrival to the source today.. . For now, really on no medications except for the Eliquis for anticoagulation.

## 2023-05-20 NOTE — Assessment & Plan Note (Signed)
Remains on Eliquis for history of PAF with CHA2DS2-VASc score 5 if not higher.  Not currently on rate control agent, but is on Eliquis for stroke prophylaxis.  Okay to hold Eliquis 2 to 3 days preop for surgeries or procedures.  For precath anticoagulation monitor: Last dose of Eliquis will be

## 2023-05-20 NOTE — Assessment & Plan Note (Signed)
No recent lipid labs available. -For precath labs will order FLP, advanced chemistry panel, A1c.

## 2023-05-20 NOTE — Telephone Encounter (Signed)
Spoke to wife and patient verbal went over  instruction and direction for upcoming Right and left heart cath.  The written direction/instruction will be given to patient when he come to have lab work done on 05/22/23.  Both verbalized understanding .    Aware to take Aspirin 81 mg day of procedure Holding Metformin 48 hours prior ( 05/25/23)  Jardiance 3 days prior( 05/24/23)   Eliquis  stopping  after morning dose 05/25/23 Holding lasix day of procedure.

## 2023-05-20 NOTE — Patient Instructions (Addendum)
Medication Instructions:  Pre-Cath: last dose of Eliquis is AM of 05/25/23   See below  medication changes for right and left heart cath  *If you need a refill on your cardiac medications before your next appointment, please call your pharmacy*   Lab Work: Pre-Cath Labs: CMP, CBC, A1c & FLP If you have labs (blood work) drawn today and your tests are completely normal, you will receive your results only by: MyChart Message (if you have MyChart) OR A paper copy in the mail If you have any lab test that is abnormal or we need to change your treatment, we will call you to review the results.   Testing/Procedures: Right & Left Heart Cath 05/27/2023-  Your physician has requested that you have a cardiac catheterization . Cardiac catheterization is used to diagnose and/or treat various heart conditions. Doctors may recommend this procedure for a number of different reasons. The most common reason is to evaluate chest pain. Chest pain can be a symptom of coronary artery disease (CAD), and cardiac catheterization can show whether plaque is narrowing or blocking your heart's arteries. This procedure is also used to evaluate the valves, as well as measure the blood flow and oxygen levels in different parts of your heart. For further information please visit [email or web address] Please follow instruction sheet, as given.    Follow-Up: At [facility], you and your health needs are our priority.  As part of our continuing mission to provide you with exceptional heart care, we have created designated Provider Care Teams.  These Care Teams include your primary Cardiologist (physician) and Advanced Practice Providers (APPs -  Physician Assistants and Nurse Practitioners) who all work together to provide you with the care you need, when you need it.  We recommend signing up for the patient portal called "MyChart".  Sign up information is provided on this After Visit Summary.  MyChart is used to  connect with patients for Virtual Visits (Telemedicine).  Patients are able to view lab/test results, encounter notes, upcoming appointments, etc.  Non-urgent messages can be sent to your provider as well.   To learn more about what you can do with MyChart, go to [email or web address].    Your next appointment:   06/30/23 4 PM as scheduled  Provider:   Bryan Lemma, MD     Other Instructions Ok to take 1 NTG        [facility] A DEPT OF [place] AT [facility] [address] [place] [address] Dept: [phone] Loc: [phone]  Kenneth [name]  05/20/2023  You are scheduled for a Cardiac Catheterization on Tuesday, February 18 with Dr. Bryan Lemma.  1. Please arrive at the [facility] (Main Entrance A) at [facility]: [address] [place], [place] [address] at 8:30 AM (This time is 2 hour(s) before your procedure to ensure your preparation).   Free valet parking service is available. You will check in at ADMITTING. The support person will be asked to wait in the waiting room.  It is OK to have someone drop you off and come back when you are ready to be discharged.    Special note: Every effort is made to have your procedure done on time. Please understand that emergencies sometimes delay scheduled procedures.  2. Diet: Do not eat solid foods after midnight.  The patient may have clear liquids until 5am upon the day of the procedure.  3. Labs: You will  need to have blood drawn on [facility] [address], [place]  Open: 8am - 5pm (Lunch 12:30 - 1:30)   Phone: [phone], February 13 at . You do not need to be fasting.  4. Medication instructions in preparation for your procedure:   Contrast Allergy: No   Stop taking Eliquis (Apixiban)AFTER THE MORNING DOSE  on Sunday, February 16.  Do not  take, Lasix (Furosemide) on  Tuesday, February 18,    Do not take  Diabetes Med Glucophage (Metformin) on the day of the procedure and HOLD 48 HOURS AFTER THE PROCEDURE.  Do  not take Jardiance  12.5 mg after morning does Saturday May 24, 2023    On the morning of your procedure, take  Aspirin 81 mg ( only the morning of procedure)  and any morning medicines NOT listed above.  You may use sips of water.  5. Plan to go home the same day, you will only stay overnight if medically necessary. 6. Bring a current list of your medications and current insurance cards. 7. You MUST have a responsible person to drive you home. 8. Someone MUST be with you the first 24 hours after you arrive home or your discharge will be delayed. 9. Please wear clothes that are easy to get on and off and wear slip-on shoes.  Thank you for allowing [place] to care for you!   -- [facility]

## 2023-05-20 NOTE — Assessment & Plan Note (Signed)
History of anemia, we will be checking CBC as part of precath labs

## 2023-05-20 NOTE — Assessment & Plan Note (Signed)
I personally reviewed the echocardiogram films and I do see that there is progression of the aortic stenosis into the moderate to severe range.   There were several mean gradient as noted only one of them was above 40 at 44.2 mmHg.  Otherwise the other 6 were in the mid to upper 30s millimeter mercury.  I discussed with Dr. Excell Seltzer from TAVR/structural heart clinic who feels that based on the fact that Kenneth [name] is extremely dyspneic and will likely show signs of continued progression that is reasonable to initiate pathway for preop assessment with right and left heart catheterization.  -Plan right left heart catheterization -Referral to Structural Heart Team for consideration of TAVR

## 2023-05-20 NOTE — Assessment & Plan Note (Signed)
Stable blood pressure not really on any medications at this point.  Plan was to allow for permissive hypertension based on fatigue and dizziness.

## 2023-05-20 NOTE — Assessment & Plan Note (Addendum)
Concerning now that he is having chest pain after exertion.  There are some typical as well as atypical features.  This would be symptoms on top of standing dose of 500 mg twice daily Ranexa. Currently being managed with Ranexa 500 mg daily, Crestor 40 mg daily, intermittent dosing of furosemide 20 mg daily, apixaban 5 mg daily.  No plans for changing meds at this point, but we will plan for Right and Left Heart Catheterization on February 18.

## 2023-05-20 NOTE — Assessment & Plan Note (Addendum)
LAD and RCA PCI with PTCA versus diagonal sidebranch.  No longer on Thienopyridine as he is on standing dose of Eliquis for his history of A-fib.  With recurrent chest pain and worsening dyspnea, concern for possible ischemic CAD as part of the etiology.  -Plan cardiac catheterization (right and left heart cath with possible PCI as part of ischemic as well as pre-TAVR workup.

## 2023-05-20 NOTE — Progress Notes (Signed)
Virtual Visit via Telephone Note   Because of [name]'s co-morbid illnesses, he is at least at moderate risk for complications without adequate follow up.  This format is felt to be most appropriate for this patient at this time.  The patient did not have access to video technology/had technical difficulties with video requiring transitioning to audio format only (telephone).  All issues noted in this document were discussed and addressed.  No physical exam could be performed with this format.  Please refer to the patient's chart for his consent to telehealth for [facility].   Patient has given verbal permission to conduct this visit via virtual appointment and to bill insurance 05/20/2023 4:22 PM     Evaluation Performed:  Follow-up visit  Patient Location: Home Provider Location: Office/Clinic  Date:  05/20/2023  ID:  [name], DOB [birth year], MRN [id number] PCP: Anson Fret, MD  [facility] Providers Cardiologist:  Bryan Lemma, MD     Chief Complaint  Patient presents with   Follow-up    Significant exertional dyspnea now with chest pain   Cardiac Valve Problem    Echo showing progression of disease   Patient Profile: .     [name] is a very pleasant 78 y.o. male with a PMH reviewed below-notable for CAD-PCI, PAF/atrial flutter s/p flutter ablation in 2006, A-fib ablation in 2015), Aortic Stenosis, and interstitial lung disease who presents here for telephone follow-up to discuss test results and plan based on recent echocardiogram showing progression of aortic stenosis from moderate to severe, he presents at the request of Dr. Vassie Loll for PCCM.   He has been followed here at the request of Dr. Anson Fret from the [facility], however he is currently in the process of [facility] to [facility] new [facility].  PERTINENT HISTORY: 2006: Atrial Flutter - s/p Ablation 01/2014: Afib Ablation (off OAC) 06/2016: CAD-PCI sx of DOE -> [facility] - mild Pulm HTN,  Severe mRCA 95% (DES PCI), p-mLAD ~70% w/ FFR 0.75 (staged DES PCI). Co-dom LCx distal 60% & OM3 55%.  07/2016: Staged PCI LAD 10/11/2020 - [facility] - FFR guided DES PCI (PTCA ost D1); patent mRCA DES as well as p-m LAD. => m-d LAD 70% (after D1 - 60% ost D1) - FFR 0.75 -> Synergy DES 2.5 x 32 (post-dilation 3.7-2.6 mm), Ost D1 PTCA -> reduced to 40%. 07/04/2021 - [facility]: Significant ISR at LAD-D2 bifurcation site ->  PTCA with kissing balloon technique in D2.  Restored TIMI-3 flow reduce the LAD lesion to 25% and D2 40% Stress PET 03/2022:  NORMAL PERFUSION/LOW RISK.  Normal LVEF.  Suspected reduced myocardial blood flow reserve related to microvascular disease.  No infarction or ischemia.Marland Kitchen  Resting EF 48% and stress EF 54%.  Mild LV enlargement.  Stable mosaic attenuation in the lungs. Progression to Severe Aortic Stenosis by last echo mean AVG 26 mmHg, by Cath 33 mmHg Interstitial Lung Disease on chronic steroids and Adalimumab    AZAD CALAME was last seen on December 31, 2022 for routine follow-up. He was actually in fairly good spirits.  He was walking with a cane.  Notable neurologic improvement and improved cognitive state.  Wide-awake and alert.  But did definitely hard of hearing.  Noting that he is not nearly as active as he once was but still trying to walk for some exercise.  Noting more shortness of breath and coughing.  Overall felt much better having reduced much of his medicines.  No PND or orthopnea or any notable edema.  No chest pain or pressure.  Still having some memory loss issues and somewhat unsteady gait. => No med changes were made.  Subjective  DONYALE BERTHOLD is being evaluated today via telephone visit and follow-up from recent echocardiogram ordered by his pulmonologist (Dr. Vassie Loll) that revealed progression of aortic stenosis from the more moderate range to [facility] with the highest mean gradient of roughly 44 mmHg. [name] is very hard of hearing, and is difficult  for him to do telephone or even worse doing video phone conversations therefore we chose to do telephone.  His wife actually does most of the talking answers most questions for him although he will nod yes or no or make a few comments here and there if pertinent.  Over the last month or 2 he has noted progression of his dyspnea and exercise intolerance but not had not really been having that much the way of any chest tightness or pressure.  Noting significant reduction in exercise tolerance, but has not had any PND orthopnea.  No notable edema.  No sensations to suggest A-fib or atrial flutter with rapid irregular heartbeats.  Noting pretty significant fatigue and exercise intolerance, but over the last couple days he has had intermittent episodes of chest pain, most notably on 05/19/2023 he and his wife were shopping at [employer] and they had to do a couple extra aisles to find items.  He did relatively well with walking in [employer], but by the time he got home he was extremely fatigued and started feeling nauseated.  Shortly after getting home he got up from his couch to go to the bathroom and and actually vomited.  He just felt extremely fatigued and worn out and started having significant chest discomfort it lasted several minutes but he finally was able to get self situated and went to sleep.  His wife indicates he did not take nitroglycerin for fear of hypotension which had occurred many years ago when he was given nitroglycerin while visiting family in the hospital and having some dyspnea.  What occurred is that he was very dyspneic from walking to the elevator and, in summary in the elevator with him mention they thought that he was having a heart attack therefore when taking the ER they gave him nitroglycerin he became profoundly hypotensive.  He is not using nitroglycerin since then.  He really is not all that active but [place]  No PND or orthopnea still.  Taking  the furosemide 2 days a week and has not required any additional dosing.  No real edema just the orthopnea at baseline along with exertional dyspnea.     Cardiovascular ROS: positive for - chest pain, dyspnea on exertion, irregular heartbeat, shortness of breath, and significant exercise fatigue and weakness.  Unsteady gait. negative for - edema, orthopnea, palpitations, paroxysmal nocturnal dyspnea, rapid heart rate, or syncope or near syncope, TIA or amaurosis fugax, claudication  ROS:  Review of Systems - Negative except significant cough, shortness of breath, exercise intolerance and fatigue.  Weight loss.    Objective    Current Outpatient Medications:    Adalimumab 40 MG/0.4ML PSKT, Inject 40 mg into the skin every 14 (fourteen) days. , Disp: , Rfl:    Adalimumab-bwwd 40 MG/0.4ML SOAJ, Inject 40 mg into the skin every 14 (fourteen) days., Disp: , Rfl:    apixaban (ELIQUIS) 5 MG TABS tablet, Take 1 tablet (5  mg total) by mouth 2 (two) times daily., Disp: 60 tablet, Rfl: 3   Biotin 1000 MCG CHEW, Chew 1,000 mcg by mouth in the morning., Disp: , Rfl:    Calcium Carbonate-Vit D-Min (CALCIUM 1200 PO), Take 1 tablet by mouth every evening., Disp: , Rfl:    cetirizine (ZYRTEC) 10 MG tablet, Take 10 mg by mouth in the morning., Disp: , Rfl:    Dorzolamide HCl-Timolol Mal PF 2-0.5 % SOLN, Apply 1 drop to eye 2 (two) times daily., Disp: , Rfl:    empagliflozin (JARDIANCE) 25 MG TABS tablet, Take 12.5 mg by mouth daily., Disp: , Rfl:    ferrous sulfate 325 (65 FE) MG tablet, Take 325 mg by mouth every Monday, Wednesday, and Friday., Disp: , Rfl:    furosemide (LASIX) 20 MG tablet, Take 1 tablet (20 mg total) by mouth 2 (two) times a week. (Monday and Thursday). Ok to take additional tablet as needed for weight gain of 2 pounds overnight or 5 pounds in one week., Disp: 30 tablet, Rfl: 3   galantamine (RAZADYNE ER) 24 MG 24 hr capsule, Take 24 mg by mouth in the morning., Disp: , Rfl:     HYDROmorphone (DILAUDID) 2 MG tablet, Take 1 tablet (2 mg total) by mouth every 6 (six) hours as needed., Disp: 15 tablet, Rfl: 0   ibuprofen (ADVIL) 200 MG tablet, Take 200-800 mg by mouth every 6 (six) hours as needed for moderate pain., Disp: , Rfl:    memantine (NAMENDA) 10 MG tablet, Take 20 mg by mouth at bedtime., Disp: , Rfl:    metFORMIN (GLUCOPHAGE-XR) 500 MG 24 hr tablet, Take 500 mg by mouth at bedtime., Disp: , Rfl:    nitroGLYCERIN (NITROSTAT) 0.4 MG SL tablet, Place 1 tablet (0.4 mg total) under the tongue every 5 (five) minutes as needed., Disp: 25 tablet, Rfl: 2   omeprazole (PRILOSEC OTC) 20 MG tablet, Take 20 mg by mouth daily as needed (Acid reflux)., Disp: , Rfl:    Polyethyl Glycol-Propyl Glycol (SYSTANE OP), Place 1 drop into both eyes 3 (three) times daily as needed (dry/irritated eyes.)., Disp: , Rfl:    predniSONE (DELTASONE) 5 MG tablet, Take 1 tablet by mouth once daily with breakfast, Disp: 90 tablet, Rfl: 1   ranolazine (RANEXA) 500 MG 12 hr tablet, Take 1 tablet (500 mg total) by mouth 2 (two) times daily., Disp: 180 tablet, Rfl: 2   rosuvastatin (CRESTOR) 40 MG tablet, Take 1 tablet (40 mg total) by mouth daily., Disp: 90 tablet, Rfl: 3   tamsulosin (FLOMAX) 0.4 MG CAPS capsule, Take 1 capsule by mouth once daily (Patient taking differently: Take 0.4 mg by mouth daily as needed (kidney stones).), Disp: 90 capsule, Rfl: 0 No current facility-administered medications for this visit.  Facility-Administered Medications Ordered in Other Visits:    lidocaine (cardiac) 100 mg/53ml (XYLOCAINE) 20 MG/ML injection 2%, , , Anesthesia Intra-op, Flowers, Rokoshi T, CRNA, 60 mg at 03/25/13 1617   propofol (DIPRIVAN) 10 mg/mL bolus/IV push, , , Anesthesia Intra-op, Flowers, Rokoshi T, CRNA, 90 mg at 03/25/13 1617   Studies Reviewed: Marland Kitchen        New Study ECHO (05/14/2023): EF 60 to 65%.  No RWMA.  GR 2 DD.  Normal RV size with normal PAP estimated at roughly 35 mmHg.  Moderate LA  dilation.  Mild MR with moderate MAC.  Moderate TR.  Severe aortic calcification with mild AI but severe AS-suggest mean gradient of the aortic valve being 42 mmHg.  Ascending already measuring 41 mm.  Normal IVC indicating normal CVP  Previous studies reviewed Right and Left Heart Cath (07/04/2021):  Stable two-vessel disease with exception of progression of in-stent restenosis disease in the stented portion of the LAD-2nd Diag is back to the LAD and branch with widely patent stent in the RCA. Culprit lesion is likely the LAD -2nd Diag bifurcation lesion which is in-stent restenosis (25% in the LAD and jailed sidebranch 70% stenosis of the 2nd Diag.  Successful [place] balloon PTCA of the LAD ISR with semicompliant balloon PTCA kissing balloon into the 2nd Diag side branch -> reducing lesions to 25% LAD, 30% diagonal with TIMI-3 flow preserved. Borderline Pulmonary Hypertension with mean PA pressure of 24 mmHg in the setting of LVEDP of 12 mmHg and PCWP pressure of 28 mmHg. Normal Cardiac Output-Index by Fick: 9.2-4.0. At least moderate aortic stenosis with mean gradient in the Cath Lab at 33 million mercury, higher than echo gradient of 26 mm reviewed. Borderline systolic blood pressures.  Diagnostic; Dominance: Right                  Risk Assessment/Calculations:          Physical Exam:   VS:  There were no vitals taken for this visit.   Wt Readings from Last 3 Encounters:  05/14/23 206 lb 12.8 oz (93.8 kg)  03/29/23 210 lb 5.1 oz (95.4 kg)  01/29/23 210 lb 6.4 oz (95.4 kg)    Seems somewhat subdued today on the telephone.  Very hard of hearing.  Most the conversation was with his wife.  Did not seem to have labored breathing, but he was sitting comfortably.     ASSESSMENT AND PLAN: .    Problem List Items Addressed This Visit       Cardiology Problems   CAD S/P percutaneous coronary angioplasty (Chronic)   LAD and RCA PCI with PTCA versus diagonal sidebranch.  No longer on  Thienopyridine as he is on standing dose of Eliquis for his history of A-fib.  With recurrent chest pain and worsening dyspnea, concern for possible ischemic CAD as part of the etiology.  -Plan cardiac catheterization (right and left heart cath with possible PCI as part of ischemic as well as pre-TAVR workup.      Chronic heart failure with preserved ejection fraction (HFpEF) (HCC) - Primary (Chronic)   Chronic HFpEF follow-up not sure how much of a role heart failure versus his lung disease plays in his dyspnea.  Now also having chest discomfort which is somewhat concerning. No complaints of PND, orthopnea, or edema.  Which would suggest that he is euvolemic.  He only takes Lasix 20 mg twice a week in addition to Jardiance 12.5 mg daily. -Continue current regimen. -Assess filling pressures and right heart cath pressures on planned catheterization.      Relevant Orders   Comprehensive metabolic panel   Ambulatory referral to Structural Heart/Valve Clinic (only at [facility])   Coronary artery disease involving native coronary artery with angina pectoris (HCC) (Chronic)   Concerning now that he is having chest pain after exertion.  There are some typical as well as atypical features.  This would be symptoms on top of standing dose of 500 mg twice daily Ranexa. Currently being managed with Ranexa 500 mg daily, Crestor 40 mg daily, intermittent dosing of furosemide 20 mg daily, apixaban 5 mg daily.  No plans for changing meds at this point, but we will plan for Right and Left  Heart Catheterization on February 18.       Relevant Orders   CBC   Ambulatory referral to Structural Heart/Valve Clinic (only at [facility])   Essential hypertension (Chronic)   Stable blood pressure not really on any medications at this point.  Plan was to allow for permissive hypertension based on fatigue and dizziness.      Hypercoagulable state due to paroxysmal atrial fibrillation (HCC); CHA2DS2-VASc score 5  (Chronic)   Remains on Eliquis for history of PAF with CHA2DS2-VASc score 5 if not higher.  Not currently on rate control agent, but is on Eliquis for stroke prophylaxis.  Okay to hold Eliquis 2 to 3 days preop for surgeries or procedures.  For precath anticoagulation monitor: Last dose of Eliquis will be      Relevant Orders   Comprehensive metabolic panel   CBC   Hyperlipidemia associated with type 2 diabetes mellitus (HCC) (Chronic)   No recent lipid labs available. -For precath labs will order FLP, advanced chemistry panel, A1c.      Relevant Orders   Lipid panel   Hemoglobin A1c   Paroxysmal atrial fibrillation (HCC) (Chronic)   He seems to be maintaining sinus rhythm on no medications.  Thankfully does not appear to be having any breakthrough spells.  We can check a precath EKG upon arrival to the source today.. . For now, really on no medications except for the Eliquis for anticoagulation.      Relevant Orders   Comprehensive metabolic panel   CBC   Ambulatory referral to Structural Heart/Valve Clinic (only at [facility])   Severe aortic stenosis by prior echocardiogram (Chronic)   I personally reviewed the echocardiogram films and I do see that there is progression of the aortic stenosis into the moderate to severe range.   There were several mean gradient as noted only one of them was above 40 at 44.2 mmHg.  Otherwise the other 6 were in the mid to upper 30s millimeter mercury.  I discussed with Dr. Excell Seltzer from TAVR/structural heart clinic who feels that based on the fact that Bryen is extremely dyspneic and will likely show signs of continued progression that is reasonable to initiate pathway for preop assessment with right and left heart catheterization.  -Plan right left heart catheterization -Referral to Structural Heart Team for consideration of TAVR       Relevant Orders   CBC   Ambulatory referral to Structural Heart/Valve Clinic (only at [facility])      Other   Controlled type 2 diabetes mellitus without complication, without long-term current use of insulin (HCC)   Relevant Orders   Hemoglobin A1c   Exertional dyspnea (Chronic)   Most profound symptoms noted and this is probably multifactorial.  We will exclude any ischemic CAD and hopefully with progression/restoration of renal function he will really recover some.      ILD (interstitial lung disease) (HCC) (Chronic)   Relevant Orders   Comprehensive metabolic panel   Iron deficiency anemia (Chronic)   History of anemia, we will be checking CBC as part of precath labs        Informed Consent   Shared Decision Making/Informed Consent The risks [stroke (1 in 1000), death (1 in 1000), kidney failure [usually temporary] (1 in 500), bleeding (1 in 200), allergic reaction [possibly serious] (1 in 200)], benefits (diagnostic support and management of coronary artery disease) and alternatives of a cardiac catheterization were discussed in detail with Mr. Kinsella and he is willing to  proceed.      Follow-Up: Return in about 6 weeks (around 06/30/2023) for Routine follow up with me.  I spent 80 minutes in the care of JAVIUS SYLLA today including reviewing labs (2 min), reviewing studies (14 min - Echo films reviewed), face to face time discussing treatment options (direct telephone discussion total 45 minutes reviewing results of the echocardiogram as well as his symptoms.  We discussed his CAD, lipids but most notably the aortic stenosis and ongoing evaluation.  Discussed change in symptoms from last conversation.  Discussed concerning symptoms for CAD/AS; we then discussed progression of aortic stenosis disease and symptoms as well as the shared decision making/consent noted above), reviewing records from previous notes including my note, hospitalization notes and note from PCCM (Dr. Vassie Loll) (45 minutes), 10 minutes discussing case with Dr. Excell Seltzer from structural heart team with upcoming plans as  well as 9 minutes dictating, and documenting in the encounter.     Signed, Marykay Lex, MD, MS Bryan Lemma, M.D., M.S. Interventional Cardiologist  [facility]  Pager # [phone] Phone # [phone] [address] [place], [place] [address]

## 2023-05-20 NOTE — H&P (View-Only) (Signed)
 Virtual Visit via Telephone Note   Because of Kenneth [name]'s co-morbid illnesses, he is at least at moderate risk for complications without adequate follow up.  This format is felt to be most appropriate for this patient at this time.  The patient did not have access to video technology/had technical difficulties with video requiring transitioning to audio format only (telephone).  All issues noted in this document were discussed and addressed.  No physical exam could be performed with this format.  Please refer to the patient's chart for his consent to telehealth for [facility].   Patient has given verbal permission to conduct this visit via virtual appointment and to bill insurance 05/20/2023 4:22 PM     Evaluation Performed:  Follow-up visit  Patient Location: Home Provider Location: Office/Clinic  Date:  05/20/2023  ID:  Kenneth [name], DOB [birth year], MRN [id number] PCP: Kenneth Fret, MD  [facility] Providers Cardiologist:  Kenneth Lemma, MD     Chief Complaint  Patient presents with   Follow-up    Significant exertional dyspnea now with chest pain   Cardiac Valve Problem    Echo showing progression of disease   Patient Profile: .     Kenneth [name] is a very pleasant 78 y.o. male with a PMH reviewed below-notable for CAD-PCI, PAF/atrial flutter s/p flutter ablation in 2006, A-fib ablation in 2015), Aortic Stenosis, and interstitial lung disease who presents here for telephone follow-up to discuss test results and plan based on recent echocardiogram showing progression of aortic stenosis from moderate to severe, he presents at the request of Dr. Vassie [name] for PCCM.   He has been followed here at the request of Dr. Anson [name] from the [facility], however he is currently in the process of [facility] new [facility].  PERTINENT HISTORY: 2006: Atrial Flutter - s/p Ablation 01/2014: Afib Ablation (off OAC) 06/2016: CAD-PCI sx of DOE -> [facility] - mild Pulm HTN,  Severe mRCA 95% (DES PCI), p-mLAD ~70% w/ FFR 0.75 (staged DES PCI). Co-dom LCx distal 60% & OM3 55%.  07/2016: Staged PCI LAD 10/11/2020 - [facility] - FFR guided DES PCI (PTCA ost D1); patent mRCA DES as well as p-m LAD. => m-d LAD 70% (after D1 - 60% ost D1) - FFR 0.75 -> Synergy DES 2.5 x 32 (post-dilation 3.7-2.6 mm), Ost D1 PTCA -> reduced to 40%. 07/04/2021 - [facility]: Significant ISR at LAD-D2 bifurcation site ->  PTCA with kissing balloon technique in D2.  Restored TIMI-3 flow reduce the LAD lesion to 25% and D2 40% Stress PET 03/2022:  NORMAL PERFUSION/LOW RISK.  Normal LVEF.  Suspected reduced myocardial blood flow reserve related to microvascular disease.  No infarction or ischemia.Marland Kitchen  Resting EF 48% and stress EF 54%.  Mild LV enlargement.  Stable mosaic attenuation in the lungs. Progression to Severe Aortic Stenosis by last echo mean AVG 26 mmHg, by Cath 33 mmHg Interstitial Lung Disease on chronic steroids and Adalimumab    Kenneth [name] was last seen on December 31, 2022 for routine follow-up. He was actually in fairly good spirits.  He was walking with a cane.  Notable neurologic improvement and improved cognitive state.  Wide-awake and alert.  But did definitely hard of hearing.  Noting that he is not nearly as active as he once was but still trying to walk for some exercise.  Noting more shortness of breath and coughing.  Overall felt much better having reduced much of his medicines.  No PND or orthopnea or any notable edema.  No chest pain or pressure.  Still having some memory loss issues and somewhat unsteady gait. => No med changes were made.  Subjective  Kenneth [name] is being evaluated today via telephone visit and follow-up from recent echocardiogram ordered by his pulmonologist (Dr. Vassie [name]) that revealed progression of aortic stenosis from the more moderate range to [facility] with the highest mean gradient of roughly 44 mmHg. Kenneth [name] is very hard of hearing, and is difficult  for him to do telephone or even worse doing video phone conversations therefore we chose to do telephone.  His wife actually does most of the talking answers most questions for him although he will nod yes or no or make a few comments here and there if pertinent.  Over the last month or 2 he has noted progression of his dyspnea and exercise intolerance but not had not really been having that much the way of any chest tightness or pressure.  Noting significant reduction in exercise tolerance, but has not had any PND orthopnea.  No notable edema.  No sensations to suggest A-fib or atrial flutter with rapid irregular heartbeats.  Noting pretty significant fatigue and exercise intolerance, but over the last couple days he has had intermittent episodes of chest pain, most notably on 05/19/2023 he and his wife were shopping at [employer] and they had to do a couple extra aisles to find items.  He did relatively well with walking in [employer], but by the time he got home he was extremely fatigued and started feeling nauseated.  Shortly after getting home he got up from his couch to go to the bathroom and and actually vomited.  He just felt extremely fatigued and worn out and started having significant chest discomfort it lasted several minutes but he finally was able to get self situated and went to sleep.  His wife indicates he did not take nitroglycerin for fear of hypotension which had occurred many years ago when he was given nitroglycerin while visiting family in the hospital and having some dyspnea.  What occurred is that he was very dyspneic from walking to the elevator and, in summary in the elevator with him mention they thought that he was having a heart attack therefore when taking the ER they gave him nitroglycerin he became profoundly hypotensive.  He is not using nitroglycerin since then.  He really is not all that active but [place]  No PND or orthopnea still.  Taking  the furosemide 2 days a week and has not required any additional dosing.  No real edema just the orthopnea at baseline along with exertional dyspnea.     Cardiovascular ROS: positive for - chest pain, dyspnea on exertion, irregular heartbeat, shortness of breath, and significant exercise fatigue and weakness.  Unsteady gait. negative for - edema, orthopnea, palpitations, paroxysmal nocturnal dyspnea, rapid heart rate, or syncope or near syncope, TIA or amaurosis fugax, claudication  ROS:  Review of Systems - Negative except significant cough, shortness of breath, exercise intolerance and fatigue.  Weight loss.    Objective    Current Outpatient Medications:    Adalimumab 40 MG/0.4ML PSKT, Inject 40 mg into the skin every 14 (fourteen) days. , Disp: , Rfl:    Adalimumab-bwwd 40 MG/0.4ML SOAJ, Inject 40 mg into the skin every 14 (fourteen) days., Disp: , Rfl:    apixaban (ELIQUIS) 5 MG TABS tablet, Take 1 tablet (5  mg total) by mouth 2 (two) times daily., Disp: 60 tablet, Rfl: 3   Biotin 1000 MCG CHEW, Chew 1,000 mcg by mouth in the morning., Disp: , Rfl:    Calcium Carbonate-Vit D-Min (CALCIUM 1200 PO), Take 1 tablet by mouth every evening., Disp: , Rfl:    cetirizine (ZYRTEC) 10 MG tablet, Take 10 mg by mouth in the morning., Disp: , Rfl:    Dorzolamide HCl-Timolol Mal PF 2-0.5 % SOLN, Apply 1 drop to eye 2 (two) times daily., Disp: , Rfl:    empagliflozin (JARDIANCE) 25 MG TABS tablet, Take 12.5 mg by mouth daily., Disp: , Rfl:    ferrous sulfate 325 (65 FE) MG tablet, Take 325 mg by mouth every Monday, Wednesday, and Friday., Disp: , Rfl:    furosemide (LASIX) 20 MG tablet, Take 1 tablet (20 mg total) by mouth 2 (two) times a week. (Monday and Thursday). Ok to take additional tablet as needed for weight gain of 2 pounds overnight or 5 pounds in one week., Disp: 30 tablet, Rfl: 3   galantamine (RAZADYNE ER) 24 MG 24 hr capsule, Take 24 mg by mouth in the morning., Disp: , Rfl:     HYDROmorphone (DILAUDID) 2 MG tablet, Take 1 tablet (2 mg total) by mouth every 6 (six) hours as needed., Disp: 15 tablet, Rfl: 0   ibuprofen (ADVIL) 200 MG tablet, Take 200-800 mg by mouth every 6 (six) hours as needed for moderate pain., Disp: , Rfl:    memantine (NAMENDA) 10 MG tablet, Take 20 mg by mouth at bedtime., Disp: , Rfl:    metFORMIN (GLUCOPHAGE-XR) 500 MG 24 hr tablet, Take 500 mg by mouth at bedtime., Disp: , Rfl:    nitroGLYCERIN (NITROSTAT) 0.4 MG SL tablet, Place 1 tablet (0.4 mg total) under the tongue every 5 (five) minutes as needed., Disp: 25 tablet, Rfl: 2   omeprazole (PRILOSEC OTC) 20 MG tablet, Take 20 mg by mouth daily as needed (Acid reflux)., Disp: , Rfl:    Polyethyl Glycol-Propyl Glycol (SYSTANE OP), Place 1 drop into both eyes 3 (three) times daily as needed (dry/irritated eyes.)., Disp: , Rfl:    predniSONE (DELTASONE) 5 MG tablet, Take 1 tablet by mouth once daily with breakfast, Disp: 90 tablet, Rfl: 1   ranolazine (RANEXA) 500 MG 12 hr tablet, Take 1 tablet (500 mg total) by mouth 2 (two) times daily., Disp: 180 tablet, Rfl: 2   rosuvastatin (CRESTOR) 40 MG tablet, Take 1 tablet (40 mg total) by mouth daily., Disp: 90 tablet, Rfl: 3   tamsulosin (FLOMAX) 0.4 MG CAPS capsule, Take 1 capsule by mouth once daily (Patient taking differently: Take 0.4 mg by mouth daily as needed (kidney stones).), Disp: 90 capsule, Rfl: 0 No current facility-administered medications for this visit.  Facility-Administered Medications Ordered in Other Visits:    lidocaine (cardiac) 100 mg/53ml (XYLOCAINE) 20 MG/ML injection 2%, , , Anesthesia Intra-op, Flowers, Rokoshi T, CRNA, 60 mg at 03/25/13 1617   propofol (DIPRIVAN) 10 mg/mL bolus/IV push, , , Anesthesia Intra-op, Flowers, Rokoshi T, CRNA, 90 mg at 03/25/13 1617   Studies Reviewed: Marland Kitchen        New Study ECHO (05/14/2023): EF 60 to 65%.  No RWMA.  GR 2 DD.  Normal RV size with normal PAP estimated at roughly 35 mmHg.  Moderate LA  dilation.  Mild MR with moderate MAC.  Moderate TR.  Severe aortic calcification with mild AI but severe AS-suggest mean gradient of the aortic valve being 42 mmHg.  Ascending already measuring 41 mm.  Normal IVC indicating normal CVP  Previous studies reviewed Right and Left Heart Cath (07/04/2021):  Stable two-vessel disease with exception of progression of in-stent restenosis disease in the stented portion of the LAD-2nd Diag is back to the LAD and branch with widely patent stent in the RCA. Culprit lesion is likely the LAD -2nd Diag bifurcation lesion which is in-stent restenosis (25% in the LAD and jailed sidebranch 70% stenosis of the 2nd Diag.  Successful [place] balloon PTCA of the LAD ISR with semicompliant balloon PTCA kissing balloon into the 2nd Diag side branch -> reducing lesions to 25% LAD, 30% diagonal with TIMI-3 flow preserved. Borderline Pulmonary Hypertension with mean PA pressure of 24 mmHg in the setting of LVEDP of 12 mmHg and PCWP pressure of 28 mmHg. Normal Cardiac Output-Index by Fick: 9.2-4.0. At least moderate aortic stenosis with mean gradient in the Cath Lab at 33 million mercury, higher than echo gradient of 26 mm reviewed. Borderline systolic blood pressures.  Diagnostic; Dominance: Right                  Risk Assessment/Calculations:          Physical Exam:   VS:  There were no vitals taken for this visit.   Wt Readings from Last 3 Encounters:  05/14/23 206 lb 12.8 oz (93.8 kg)  03/29/23 210 lb 5.1 oz (95.4 kg)  01/29/23 210 lb 6.4 oz (95.4 kg)    Seems somewhat subdued today on the telephone.  Very hard of hearing.  Most the conversation was with his wife.  Did not seem to have labored breathing, but he was sitting comfortably.     ASSESSMENT AND PLAN: .    Problem List Items Addressed This Visit       Cardiology Problems   CAD S/P percutaneous coronary angioplasty (Chronic)   LAD and RCA PCI with PTCA versus diagonal sidebranch.  No longer on  Thienopyridine as he is on standing dose of Eliquis for his history of A-fib.  With recurrent chest pain and worsening dyspnea, concern for possible ischemic CAD as part of the etiology.  -Plan cardiac catheterization (right and left heart cath with possible PCI as part of ischemic as well as pre-TAVR workup.      Chronic heart failure with preserved ejection fraction (HFpEF) (HCC) - Primary (Chronic)   Chronic HFpEF follow-up not sure how much of a role heart failure versus his lung disease plays in his dyspnea.  Now also having chest discomfort which is somewhat concerning. No complaints of PND, orthopnea, or edema.  Which would suggest that he is euvolemic.  He only takes Lasix 20 mg twice a week in addition to Jardiance 12.5 mg daily. -Continue current regimen. -Assess filling pressures and right heart cath pressures on planned catheterization.      Relevant Orders   Comprehensive metabolic panel   Ambulatory referral to Structural Heart/Valve Clinic (only at [facility])   Coronary artery disease involving native coronary artery with angina pectoris (HCC) (Chronic)   Concerning now that he is having chest pain after exertion.  There are some typical as well as atypical features.  This would be symptoms on top of standing dose of 500 mg twice daily Ranexa. Currently being managed with Ranexa 500 mg daily, Crestor 40 mg daily, intermittent dosing of furosemide 20 mg daily, apixaban 5 mg daily.  No plans for changing meds at this point, but we will plan for Right and Left  Heart Catheterization on February 18.       Relevant Orders   CBC   Ambulatory referral to Structural Heart/Valve Clinic (only at [facility])   Essential hypertension (Chronic)   Stable blood pressure not really on any medications at this point.  Plan was to allow for permissive hypertension based on fatigue and dizziness.      Hypercoagulable state due to paroxysmal atrial fibrillation (HCC); CHA2DS2-VASc score 5  (Chronic)   Remains on Eliquis for history of PAF with CHA2DS2-VASc score 5 if not higher.  Not currently on rate control agent, but is on Eliquis for stroke prophylaxis.  Okay to hold Eliquis 2 to 3 days preop for surgeries or procedures.  For precath anticoagulation monitor: Last dose of Eliquis will be      Relevant Orders   Comprehensive metabolic panel   CBC   Hyperlipidemia associated with type 2 diabetes mellitus (HCC) (Chronic)   No recent lipid labs available. -For precath labs will order FLP, advanced chemistry panel, A1c.      Relevant Orders   Lipid panel   Hemoglobin A1c   Paroxysmal atrial fibrillation (HCC) (Chronic)   He seems to be maintaining sinus rhythm on no medications.  Thankfully does not appear to be having any breakthrough spells.  We can check a precath EKG upon arrival to the source today.. . For now, really on no medications except for the Eliquis for anticoagulation.      Relevant Orders   Comprehensive metabolic panel   CBC   Ambulatory referral to Structural Heart/Valve Clinic (only at [facility])   Severe aortic stenosis by prior echocardiogram (Chronic)   I personally reviewed the echocardiogram films and I do see that there is progression of the aortic stenosis into the moderate to severe range.   There were several mean gradient as noted only one of them was above 40 at 44.2 mmHg.  Otherwise the other 6 were in the mid to upper 30s millimeter mercury.  I discussed with Dr. Excell Seltzer from TAVR/structural heart clinic who feels that based on the fact that Kenneth [name] is extremely dyspneic and will likely show signs of continued progression that is reasonable to initiate pathway for preop assessment with right and left heart catheterization.  -Plan right left heart catheterization -Referral to Structural Heart Team for consideration of TAVR       Relevant Orders   CBC   Ambulatory referral to Structural Heart/Valve Clinic (only at [facility])      Other   Controlled type 2 diabetes mellitus without complication, without long-term current use of insulin (HCC)   Relevant Orders   Hemoglobin A1c   Exertional dyspnea (Chronic)   Most profound symptoms noted and this is probably multifactorial.  We will exclude any ischemic CAD and hopefully with progression/restoration of renal function he will really recover some.      ILD (interstitial lung disease) (HCC) (Chronic)   Relevant Orders   Comprehensive metabolic panel   Iron deficiency anemia (Chronic)   History of anemia, we will be checking CBC as part of precath labs        Informed Consent   Shared Decision Making/Informed Consent The risks [stroke (1 in 1000), death (1 in 1000), kidney failure [usually temporary] (1 in 500), bleeding (1 in 200), allergic reaction [possibly serious] (1 in 200)], benefits (diagnostic support and management of coronary artery disease) and alternatives of a cardiac catheterization were discussed in detail with Mr. Kinsella and he is willing to  proceed.      Follow-Up: Return in about 6 weeks (around 06/30/2023) for Routine follow up with me.  I spent 80 minutes in the care of Kenneth [name] today including reviewing labs (2 min), reviewing studies (14 min - Echo films reviewed), face to face time discussing treatment options (direct telephone discussion total 45 minutes reviewing results of the echocardiogram as well as his symptoms.  We discussed his CAD, lipids but most notably the aortic stenosis and ongoing evaluation.  Discussed change in symptoms from last conversation.  Discussed concerning symptoms for CAD/AS; we then discussed progression of aortic stenosis disease and symptoms as well as the shared decision making/consent noted above), reviewing records from previous notes including my note, hospitalization notes and note from PCCM (Dr. Vassie [name]) (45 minutes), 10 minutes discussing case with Dr. Excell Seltzer from structural heart team with upcoming plans as  well as 9 minutes dictating, and documenting in the encounter.     Signed, Marykay Lex, MD, MS Kenneth [name], M.D., M.S. Interventional Cardiologist  [facility]  Pager # [phone] Phone # [phone] [address] [place], [place] [address]

## 2023-05-20 NOTE — Assessment & Plan Note (Signed)
Most profound symptoms noted and this is probably multifactorial.  We will exclude any ischemic CAD and hopefully with progression/restoration of renal function he will really recover some.

## 2023-05-21 ENCOUNTER — Other Ambulatory Visit: Payer: Self-pay | Admitting: *Deleted

## 2023-05-21 DIAGNOSIS — I350 Nonrheumatic aortic (valve) stenosis: Secondary | ICD-10-CM

## 2023-05-21 DIAGNOSIS — I25119 Atherosclerotic heart disease of native coronary artery with unspecified angina pectoris: Secondary | ICD-10-CM

## 2023-05-21 NOTE — Progress Notes (Signed)
Order placed for  upcoming right and left heart cath

## 2023-05-22 ENCOUNTER — Encounter: Payer: Self-pay | Admitting: Physician Assistant

## 2023-05-22 DIAGNOSIS — I480 Paroxysmal atrial fibrillation: Secondary | ICD-10-CM | POA: Diagnosis not present

## 2023-05-22 DIAGNOSIS — E119 Type 2 diabetes mellitus without complications: Secondary | ICD-10-CM | POA: Diagnosis not present

## 2023-05-22 DIAGNOSIS — D6869 Other thrombophilia: Secondary | ICD-10-CM | POA: Diagnosis not present

## 2023-05-22 DIAGNOSIS — I25119 Atherosclerotic heart disease of native coronary artery with unspecified angina pectoris: Secondary | ICD-10-CM | POA: Diagnosis not present

## 2023-05-22 DIAGNOSIS — E1169 Type 2 diabetes mellitus with other specified complication: Secondary | ICD-10-CM | POA: Diagnosis not present

## 2023-05-22 DIAGNOSIS — E785 Hyperlipidemia, unspecified: Secondary | ICD-10-CM | POA: Diagnosis not present

## 2023-05-22 DIAGNOSIS — I350 Nonrheumatic aortic (valve) stenosis: Secondary | ICD-10-CM | POA: Diagnosis not present

## 2023-05-22 DIAGNOSIS — J849 Interstitial pulmonary disease, unspecified: Secondary | ICD-10-CM | POA: Diagnosis not present

## 2023-05-22 DIAGNOSIS — I5032 Chronic diastolic (congestive) heart failure: Secondary | ICD-10-CM | POA: Diagnosis not present

## 2023-05-22 LAB — CBC

## 2023-05-23 LAB — LIPID PANEL
Chol/HDL Ratio: 3.6 {ratio} (ref 0.0–5.0)
Cholesterol, Total: 176 mg/dL (ref 100–199)
HDL: 49 mg/dL (ref >39)
LDL Chol Calc (NIH): 80 mg/dL (ref 0–99)
Triglycerides: 286 mg/dL — ABNORMAL HIGH (ref 0–149)
VLDL Cholesterol Cal: 47 mg/dL — ABNORMAL HIGH (ref 5–40)

## 2023-05-23 LAB — CBC
Hematocrit: 40.4 % (ref 37.5–51.0)
Hemoglobin: 13.2 g/dL (ref 13.0–17.7)
MCH: 29.3 pg (ref 26.6–33.0)
MCHC: 32.7 g/dL (ref 31.5–35.7)
MCV: 90 fL (ref 79–97)
Platelets: 229 10*3/uL (ref 150–450)
RBC: 4.5 x10E6/uL (ref 4.14–5.80)
RDW: 13.8 % (ref 11.6–15.4)
WBC: 7.1 10*3/uL (ref 3.4–10.8)

## 2023-05-23 LAB — HEMOGLOBIN A1C
Est. average glucose Bld gHb Est-mCnc: 134 mg/dL
Hgb A1c MFr Bld: 6.3 % — ABNORMAL HIGH (ref 4.8–5.6)

## 2023-05-23 LAB — COMPREHENSIVE METABOLIC PANEL
ALT: 22 [IU]/L (ref 0–44)
AST: 24 [IU]/L (ref 0–40)
Albumin: 4.6 g/dL (ref 3.8–4.8)
Alkaline Phosphatase: 77 [IU]/L (ref 44–121)
BUN/Creatinine Ratio: 10 (ref 10–24)
BUN: 11 mg/dL (ref 8–27)
Bilirubin Total: 0.6 mg/dL (ref 0.0–1.2)
CO2: 24 mmol/L (ref 20–29)
Calcium: 9.8 mg/dL (ref 8.6–10.2)
Chloride: 104 mmol/L (ref 96–106)
Creatinine, Ser: 1.1 mg/dL (ref 0.76–1.27)
Globulin, Total: 2.6 g/dL (ref 1.5–4.5)
Glucose: 110 mg/dL — ABNORMAL HIGH (ref 70–99)
Potassium: 5 mmol/L (ref 3.5–5.2)
Sodium: 142 mmol/L (ref 134–144)
Total Protein: 7.2 g/dL (ref 6.0–8.5)
eGFR: 69 mL/min/{1.73_m2} (ref >59)

## 2023-05-24 ENCOUNTER — Encounter: Payer: Self-pay | Admitting: Cardiology

## 2023-05-27 ENCOUNTER — Ambulatory Visit (HOSPITAL_COMMUNITY)
Admission: RE | Admit: 2023-05-27 | Discharge: 2023-05-27 | Disposition: A | Discharge status: Home / Self Care | Payer: Medicare Other | Attending: Cardiology | Admitting: Cardiology

## 2023-05-27 ENCOUNTER — Encounter (HOSPITAL_COMMUNITY)
Admission: RE | Disposition: A | Discharge status: Home / Self Care | Payer: Self-pay | Source: Home / Self Care | Attending: Cardiology

## 2023-05-27 ENCOUNTER — Other Ambulatory Visit: Payer: Self-pay

## 2023-05-27 DIAGNOSIS — I480 Paroxysmal atrial fibrillation: Secondary | ICD-10-CM | POA: Insufficient documentation

## 2023-05-27 DIAGNOSIS — I350 Nonrheumatic aortic (valve) stenosis: Secondary | ICD-10-CM | POA: Insufficient documentation

## 2023-05-27 DIAGNOSIS — Z7984 Long term (current) use of oral hypoglycemic drugs: Secondary | ICD-10-CM | POA: Diagnosis not present

## 2023-05-27 DIAGNOSIS — J849 Interstitial pulmonary disease, unspecified: Secondary | ICD-10-CM | POA: Insufficient documentation

## 2023-05-27 DIAGNOSIS — I110 Hypertensive heart disease with heart failure: Secondary | ICD-10-CM | POA: Insufficient documentation

## 2023-05-27 DIAGNOSIS — E1169 Type 2 diabetes mellitus with other specified complication: Secondary | ICD-10-CM | POA: Insufficient documentation

## 2023-05-27 DIAGNOSIS — I5032 Chronic diastolic (congestive) heart failure: Secondary | ICD-10-CM | POA: Insufficient documentation

## 2023-05-27 DIAGNOSIS — Z955 Presence of coronary angioplasty implant and graft: Secondary | ICD-10-CM | POA: Diagnosis not present

## 2023-05-27 DIAGNOSIS — E785 Hyperlipidemia, unspecified: Secondary | ICD-10-CM | POA: Diagnosis not present

## 2023-05-27 DIAGNOSIS — Z79899 Other long term (current) drug therapy: Secondary | ICD-10-CM | POA: Diagnosis not present

## 2023-05-27 DIAGNOSIS — D6859 Other primary thrombophilia: Secondary | ICD-10-CM | POA: Insufficient documentation

## 2023-05-27 DIAGNOSIS — D509 Iron deficiency anemia, unspecified: Secondary | ICD-10-CM | POA: Diagnosis not present

## 2023-05-27 DIAGNOSIS — I25118 Atherosclerotic heart disease of native coronary artery with other forms of angina pectoris: Secondary | ICD-10-CM | POA: Diagnosis not present

## 2023-05-27 DIAGNOSIS — I25119 Atherosclerotic heart disease of native coronary artery with unspecified angina pectoris: Secondary | ICD-10-CM

## 2023-05-27 HISTORY — PX: RIGHT/LEFT HEART CATH AND CORONARY ANGIOGRAPHY: CATH118266

## 2023-05-27 LAB — POCT I-STAT EG7
Acid-base deficit: 2 mmol/L (ref 0.0–2.0)
Bicarbonate: 20.9 mmol/L (ref 20.0–28.0)
Calcium, Ion: 1.18 mmol/L (ref 1.15–1.40)
HCT: 37 % — ABNORMAL LOW (ref 39.0–52.0)
Hemoglobin: 12.6 g/dL — ABNORMAL LOW (ref 13.0–17.0)
O2 Saturation: 69 %
Potassium: 3.8 mmol/L (ref 3.5–5.1)
Sodium: 141 mmol/L (ref 135–145)
TCO2: 22 mmol/L (ref 22–32)
pCO2, Ven: 28.7 mm[Hg] — ABNORMAL LOW (ref 44–60)
pH, Ven: 7.469 — ABNORMAL HIGH (ref 7.25–7.43)
pO2, Ven: 33 mm[Hg] (ref 32–45)

## 2023-05-27 LAB — GLUCOSE, CAPILLARY
Glucose-Capillary: 118 mg/dL — ABNORMAL HIGH (ref 70–99)
Glucose-Capillary: 102 mg/dL — ABNORMAL HIGH (ref 70–99)

## 2023-05-27 SURGERY — RIGHT/LEFT HEART CATH AND CORONARY ANGIOGRAPHY
Anesthesia: LOCAL

## 2023-05-27 MED ORDER — VERAPAMIL HCL 2.5 MG/ML IV SOLN
INTRAVENOUS | Status: AC
  Filled 2023-05-27: qty 2

## 2023-05-27 MED ORDER — HEPARIN SODIUM (PORCINE) 1000 UNIT/ML IJ SOLN
INTRAMUSCULAR | Status: AC
  Filled 2023-05-27: qty 10

## 2023-05-27 MED ORDER — LIDOCAINE HCL (PF) 1 % IJ SOLN
INTRAMUSCULAR | Status: DC | PRN
  Administered 2023-05-27 (×2): 2 mL

## 2023-05-27 MED ORDER — ONDANSETRON HCL 4 MG/2ML IJ SOLN: 4.0000 mg | Freq: Four times a day (QID) | INTRAMUSCULAR | Status: DC | PRN

## 2023-05-27 MED ORDER — VERAPAMIL HCL 2.5 MG/ML IV SOLN
INTRAVENOUS | Status: DC | PRN
  Administered 2023-05-27: 10 mL via INTRA_ARTERIAL

## 2023-05-27 MED ORDER — LIDOCAINE HCL (PF) 1 % IJ SOLN
INTRAMUSCULAR | Status: AC
  Filled 2023-05-27: qty 30

## 2023-05-27 MED ORDER — MIDAZOLAM HCL 2 MG/2ML IJ SOLN
INTRAMUSCULAR | Status: AC
  Filled 2023-05-27: qty 2

## 2023-05-27 MED ORDER — FENTANYL CITRATE (PF) 100 MCG/2ML IJ SOLN
INTRAMUSCULAR | Status: AC
  Filled 2023-05-27: qty 2

## 2023-05-27 MED ORDER — HEPARIN (PORCINE) IN NACL 1000-0.9 UT/500ML-% IV SOLN
INTRAVENOUS | Status: DC | PRN
  Administered 2023-05-27 (×2): 500 mL via INTRA_ARTERIAL

## 2023-05-27 MED ORDER — HEPARIN SODIUM (PORCINE) 1000 UNIT/ML IJ SOLN
INTRAMUSCULAR | Status: DC | PRN
  Administered 2023-05-27: 4000 [IU] via INTRAVENOUS

## 2023-05-27 MED ORDER — IOHEXOL 350 MG/ML SOLN
INTRAVENOUS | Status: DC | PRN
  Administered 2023-05-27: 70 mL

## 2023-05-27 MED ORDER — ASPIRIN 81 MG PO CHEW: 81.0000 mg | CHEWABLE_TABLET | Freq: Once | ORAL | Status: DC

## 2023-05-27 MED ORDER — SODIUM CHLORIDE 0.9 % WEIGHT BASED INFUSION: 3.0000 mL/kg/h | INTRAVENOUS | Status: AC

## 2023-05-27 MED ORDER — SODIUM CHLORIDE 0.9 % WEIGHT BASED INFUSION: 1.0000 mL/kg/h | INTRAVENOUS | Status: DC

## 2023-05-27 MED ORDER — SODIUM CHLORIDE 0.9 % IV SOLN: INTRAVENOUS | Status: DC

## 2023-05-27 MED ORDER — LABETALOL HCL 5 MG/ML IV SOLN: 10.0000 mg | INTRAVENOUS | Status: DC | PRN

## 2023-05-27 MED ORDER — ACETAMINOPHEN 325 MG PO TABS: 650.0000 mg | ORAL_TABLET | ORAL | Status: DC | PRN

## 2023-05-27 MED ORDER — SODIUM CHLORIDE 0.9% FLUSH: 3.0000 mL | INTRAVENOUS | Status: DC | PRN

## 2023-05-27 MED ORDER — SODIUM CHLORIDE 0.9% FLUSH: 3.0000 mL | Freq: Two times a day (BID) | INTRAVENOUS | Status: DC

## 2023-05-27 MED ORDER — HYDRALAZINE HCL 20 MG/ML IJ SOLN: 10.0000 mg | INTRAMUSCULAR | Status: DC | PRN

## 2023-05-27 MED ORDER — SODIUM CHLORIDE 0.9 % IV SOLN: 250.0000 mL | INTRAVENOUS | Status: DC | PRN

## 2023-05-27 SURGICAL SUPPLY — 15 items
CATH BALLN WEDGE 5F 110CM (CATHETERS) IMPLANT
CATH INFINITI 5FR AL1 (CATHETERS) IMPLANT
CATH INFINITI AMBI 5FR TG (CATHETERS) IMPLANT
CATH VISTA GUIDE 6FR XBLD 3.5 (CATHETERS) IMPLANT
DEVICE RAD COMP TR BAND LRG (VASCULAR PRODUCTS) IMPLANT
GLIDESHEATH SLEND SS 6F .021 (SHEATH) IMPLANT
GUIDEWIRE INQWIRE 1.5J.035X260 (WIRE) IMPLANT
INQWIRE 1.5J .035X260CM (WIRE) ×1 IMPLANT
PACK CARDIAC CATHETERIZATION (CUSTOM PROCEDURE TRAY) ×2 IMPLANT
SET ATX-X65L (MISCELLANEOUS) IMPLANT
SHEATH GLIDE SLENDER 4/5FR (SHEATH) IMPLANT
SHEATH PROBE COVER 6X72 (BAG) IMPLANT
WIRE EMERALD 3MM-J .025X260CM (WIRE) IMPLANT
WIRE EMERALD ST .035X260CM (WIRE) IMPLANT
WIRE MICRO SET SILHO 5FR 7 (SHEATH) IMPLANT

## 2023-05-27 NOTE — Interval H&P Note (Signed)
History and Physical Interval Note:  05/27/2023 2:08 PM  Kenneth [name]  has presented today for surgery, with the diagnosis of cad - aortic stenosis.  The various methods of treatment have been discussed with the patient and family. After consideration of risks, benefits and other options for treatment, the patient has consented to  Procedure(s): RIGHT/LEFT HEART CATH AND CORONARY ANGIOGRAPHY (N/A)  PERCUTANEOUS CORONARY INTERVENTION  as a surgical intervention.  The patient's history has been reviewed, patient examined, no change in status, stable for surgery.  I have reviewed the patient's chart and labs.  Questions were answered to the patient's satisfaction.     Cath Lab Visit (complete for each Cath Lab visit)  Clinical Evaluation Leading to the Procedure:   ACS: No.  Non-ACS:    Anginal Classification: CCS II  Anti-ischemic medical therapy: Maximal Therapy (2 or more classes of medications)  Non-Invasive Test Results: Equivocal test results - Severe AS  Prior CABG: No previous CABG    Bryan Lemma

## 2023-05-27 NOTE — Discharge Instructions (Signed)

## 2023-05-28 ENCOUNTER — Encounter (HOSPITAL_COMMUNITY): Payer: Self-pay | Admitting: Cardiology

## 2023-05-28 LAB — POCT I-STAT 7, (LYTES, BLD GAS, ICA,H+H)
Acid-base deficit: 2 mmol/L (ref 0.0–2.0)
Bicarbonate: 19.4 mmol/L — ABNORMAL LOW (ref 20.0–28.0)
Calcium, Ion: 1.15 mmol/L (ref 1.15–1.40)
HCT: 36 % — ABNORMAL LOW (ref 39.0–52.0)
Hemoglobin: 12.2 g/dL — ABNORMAL LOW (ref 13.0–17.0)
O2 Saturation: 99 %
Potassium: 3.8 mmol/L (ref 3.5–5.1)
Sodium: 142 mmol/L (ref 135–145)
TCO2: 20 mmol/L — ABNORMAL LOW (ref 22–32)
pCO2 arterial: 24 mm[Hg] — ABNORMAL LOW (ref 32–48)
pH, Arterial: 7.515 — ABNORMAL HIGH (ref 7.35–7.45)
pO2, Arterial: 103 mm[Hg] (ref 83–108)

## 2023-05-28 LAB — POCT I-STAT EG7
Acid-base deficit: 2 mmol/L (ref 0.0–2.0)
Bicarbonate: 20.7 mmol/L (ref 20.0–28.0)
Calcium, Ion: 1.16 mmol/L (ref 1.15–1.40)
HCT: 37 % — ABNORMAL LOW (ref 39.0–52.0)
Hemoglobin: 12.6 g/dL — ABNORMAL LOW (ref 13.0–17.0)
O2 Saturation: 71 %
Potassium: 3.7 mmol/L (ref 3.5–5.1)
Sodium: 142 mmol/L (ref 135–145)
TCO2: 22 mmol/L (ref 22–32)
pCO2, Ven: 28.1 mm[Hg] — ABNORMAL LOW (ref 44–60)
pH, Ven: 7.476 — ABNORMAL HIGH (ref 7.25–7.43)
pO2, Ven: 34 mm[Hg] (ref 32–45)

## 2023-06-02 ENCOUNTER — Emergency Department (HOSPITAL_COMMUNITY): Payer: Medicare Other

## 2023-06-02 ENCOUNTER — Other Ambulatory Visit: Payer: Self-pay

## 2023-06-02 ENCOUNTER — Telehealth: Payer: Self-pay | Admitting: Cardiology

## 2023-06-02 ENCOUNTER — Encounter (HOSPITAL_COMMUNITY): Payer: Self-pay | Admitting: Emergency Medicine

## 2023-06-02 ENCOUNTER — Observation Stay (HOSPITAL_BASED_OUTPATIENT_CLINIC_OR_DEPARTMENT_OTHER)
Admission: EM | Admit: 2023-06-02 | Discharge: 2023-06-03 | Disposition: A | Discharge status: Home / Self Care | Payer: Medicare Other | Source: Home / Self Care | Attending: Emergency Medicine | Admitting: Emergency Medicine

## 2023-06-02 DIAGNOSIS — Z01818 Encounter for other preprocedural examination: Secondary | ICD-10-CM | POA: Diagnosis not present

## 2023-06-02 DIAGNOSIS — R54 Age-related physical debility: Secondary | ICD-10-CM | POA: Diagnosis present

## 2023-06-02 DIAGNOSIS — G4733 Obstructive sleep apnea (adult) (pediatric): Secondary | ICD-10-CM | POA: Diagnosis not present

## 2023-06-02 DIAGNOSIS — R55 Syncope and collapse: Secondary | ICD-10-CM | POA: Diagnosis not present

## 2023-06-02 DIAGNOSIS — Z1152 Encounter for screening for COVID-19: Secondary | ICD-10-CM | POA: Insufficient documentation

## 2023-06-02 DIAGNOSIS — Y712 Prosthetic and other implants, materials and accessory cardiovascular devices associated with adverse incidents: Secondary | ICD-10-CM | POA: Diagnosis present

## 2023-06-02 DIAGNOSIS — R0789 Other chest pain: Secondary | ICD-10-CM | POA: Insufficient documentation

## 2023-06-02 DIAGNOSIS — R0609 Other forms of dyspnea: Secondary | ICD-10-CM

## 2023-06-02 DIAGNOSIS — Z87891 Personal history of nicotine dependence: Secondary | ICD-10-CM | POA: Insufficient documentation

## 2023-06-02 DIAGNOSIS — K219 Gastro-esophageal reflux disease without esophagitis: Secondary | ICD-10-CM | POA: Diagnosis present

## 2023-06-02 DIAGNOSIS — Z7901 Long term (current) use of anticoagulants: Secondary | ICD-10-CM | POA: Diagnosis not present

## 2023-06-02 DIAGNOSIS — L400 Psoriasis vulgaris: Secondary | ICD-10-CM | POA: Diagnosis present

## 2023-06-02 DIAGNOSIS — Z006 Encounter for examination for normal comparison and control in clinical research program: Secondary | ICD-10-CM | POA: Diagnosis not present

## 2023-06-02 DIAGNOSIS — I77810 Thoracic aortic ectasia: Secondary | ICD-10-CM | POA: Diagnosis not present

## 2023-06-02 DIAGNOSIS — Z48812 Encounter for surgical aftercare following surgery on the circulatory system: Secondary | ICD-10-CM | POA: Diagnosis not present

## 2023-06-02 DIAGNOSIS — Z79899 Other long term (current) drug therapy: Secondary | ICD-10-CM | POA: Diagnosis not present

## 2023-06-02 DIAGNOSIS — R0602 Shortness of breath: Principal | ICD-10-CM

## 2023-06-02 DIAGNOSIS — I2510 Atherosclerotic heart disease of native coronary artery without angina pectoris: Secondary | ICD-10-CM | POA: Diagnosis not present

## 2023-06-02 DIAGNOSIS — Z955 Presence of coronary angioplasty implant and graft: Secondary | ICD-10-CM | POA: Insufficient documentation

## 2023-06-02 DIAGNOSIS — I25118 Atherosclerotic heart disease of native coronary artery with other forms of angina pectoris: Secondary | ICD-10-CM | POA: Insufficient documentation

## 2023-06-02 DIAGNOSIS — I152 Hypertension secondary to endocrine disorders: Secondary | ICD-10-CM | POA: Diagnosis present

## 2023-06-02 DIAGNOSIS — I350 Nonrheumatic aortic (valve) stenosis: Principal | ICD-10-CM

## 2023-06-02 DIAGNOSIS — I480 Paroxysmal atrial fibrillation: Secondary | ICD-10-CM | POA: Diagnosis not present

## 2023-06-02 DIAGNOSIS — Z952 Presence of prosthetic heart valve: Secondary | ICD-10-CM | POA: Diagnosis not present

## 2023-06-02 DIAGNOSIS — R0682 Tachypnea, not elsewhere classified: Secondary | ICD-10-CM

## 2023-06-02 DIAGNOSIS — R001 Bradycardia, unspecified: Secondary | ICD-10-CM | POA: Diagnosis not present

## 2023-06-02 DIAGNOSIS — Z7984 Long term (current) use of oral hypoglycemic drugs: Secondary | ICD-10-CM | POA: Insufficient documentation

## 2023-06-02 DIAGNOSIS — E1169 Type 2 diabetes mellitus with other specified complication: Secondary | ICD-10-CM | POA: Diagnosis not present

## 2023-06-02 DIAGNOSIS — I110 Hypertensive heart disease with heart failure: Secondary | ICD-10-CM | POA: Insufficient documentation

## 2023-06-02 DIAGNOSIS — I25758 Atherosclerosis of native coronary artery of transplanted heart with other forms of angina pectoris: Secondary | ICD-10-CM | POA: Diagnosis not present

## 2023-06-02 DIAGNOSIS — J849 Interstitial pulmonary disease, unspecified: Secondary | ICD-10-CM

## 2023-06-02 DIAGNOSIS — I7121 Aneurysm of the ascending aorta, without rupture: Secondary | ICD-10-CM | POA: Diagnosis not present

## 2023-06-02 DIAGNOSIS — E1159 Type 2 diabetes mellitus with other circulatory complications: Secondary | ICD-10-CM | POA: Diagnosis present

## 2023-06-02 DIAGNOSIS — I10 Essential (primary) hypertension: Secondary | ICD-10-CM | POA: Diagnosis present

## 2023-06-02 DIAGNOSIS — E119 Type 2 diabetes mellitus without complications: Secondary | ICD-10-CM

## 2023-06-02 DIAGNOSIS — I25119 Atherosclerotic heart disease of native coronary artery with unspecified angina pectoris: Secondary | ICD-10-CM | POA: Diagnosis not present

## 2023-06-02 DIAGNOSIS — I5032 Chronic diastolic (congestive) heart failure: Secondary | ICD-10-CM | POA: Diagnosis not present

## 2023-06-02 DIAGNOSIS — F419 Anxiety disorder, unspecified: Secondary | ICD-10-CM | POA: Diagnosis present

## 2023-06-02 DIAGNOSIS — N200 Calculus of kidney: Secondary | ICD-10-CM | POA: Diagnosis not present

## 2023-06-02 DIAGNOSIS — T82855A Stenosis of coronary artery stent, initial encounter: Secondary | ICD-10-CM | POA: Diagnosis not present

## 2023-06-02 DIAGNOSIS — I5033 Acute on chronic diastolic (congestive) heart failure: Secondary | ICD-10-CM | POA: Diagnosis not present

## 2023-06-02 DIAGNOSIS — I440 Atrioventricular block, first degree: Secondary | ICD-10-CM | POA: Diagnosis not present

## 2023-06-02 DIAGNOSIS — E785 Hyperlipidemia, unspecified: Secondary | ICD-10-CM | POA: Diagnosis not present

## 2023-06-02 DIAGNOSIS — R0989 Other specified symptoms and signs involving the circulatory and respiratory systems: Secondary | ICD-10-CM | POA: Diagnosis not present

## 2023-06-02 DIAGNOSIS — I493 Ventricular premature depolarization: Secondary | ICD-10-CM | POA: Diagnosis not present

## 2023-06-02 DIAGNOSIS — I4892 Unspecified atrial flutter: Secondary | ICD-10-CM | POA: Diagnosis not present

## 2023-06-02 DIAGNOSIS — D6869 Other thrombophilia: Secondary | ICD-10-CM | POA: Diagnosis not present

## 2023-06-02 LAB — TROPONIN I (HIGH SENSITIVITY)
Troponin I (High Sensitivity): 9 ng/L (ref <18)
Troponin I (High Sensitivity): 9 ng/L (ref <18)

## 2023-06-02 LAB — BASIC METABOLIC PANEL
Anion gap: 11 (ref 5–15)
BUN: 15 mg/dL (ref 8–23)
CO2: 21 mmol/L — ABNORMAL LOW (ref 22–32)
Calcium: 9.4 mg/dL (ref 8.9–10.3)
Chloride: 105 mmol/L (ref 98–111)
Creatinine, Ser: 1.17 mg/dL (ref 0.61–1.24)
GFR, Estimated: >60 mL/min (ref >60)
Glucose, Bld: 142 mg/dL — ABNORMAL HIGH (ref 70–99)
Potassium: 4.2 mmol/L (ref 3.5–5.1)
Sodium: 137 mmol/L (ref 135–145)

## 2023-06-02 LAB — CBC
HCT: 40.4 % (ref 39.0–52.0)
Hemoglobin: 13.2 g/dL (ref 13.0–17.0)
MCH: 28.9 pg (ref 26.0–34.0)
MCHC: 32.7 g/dL (ref 30.0–36.0)
MCV: 88.6 fL (ref 80.0–100.0)
Platelets: 187 10*3/uL (ref 150–400)
RBC: 4.56 MIL/uL (ref 4.22–5.81)
RDW: 13.6 % (ref 11.5–15.5)
WBC: 8.2 10*3/uL (ref 4.0–10.5)
nRBC: 0 % (ref 0.0–0.2)

## 2023-06-02 LAB — BRAIN NATRIURETIC PEPTIDE: B Natriuretic Peptide: 47.9 pg/mL (ref 0.0–100.0)

## 2023-06-02 NOTE — Telephone Encounter (Signed)
 Pt c/o BP issue: STAT if pt c/o blurred vision, one-sided weakness or slurred speech  1. What are your last 5 BP readings?   104/58 106/52  2. Are you having any other symptoms (ex. Dizziness, headache, blurred vision, passed out)?    Very short of breath, shaky, a little dizzy when first standing up  3. What is your BP issue?   Wife [name]) stated patient's BP has been trending low and his Oxygen absorption is reading 77.  Wife wants advice on next steps.

## 2023-06-02 NOTE — ED Provider Notes (Signed)
  EMERGENCY DEPARTMENT AT [facility] Provider Note   CSN: [id number] Arrival date & time: 06/02/23  1754     History  Chief Complaint  Patient presents with   Chest Pain   Shortness of Breath    Kenneth [name] is a 78 y.o. male with history of interstitial lung disease, CAD, proximal A-fib and a flutter, aortic stenosis, recently underwent coronary angiography on 2/18 with Dr. Herbie Baltimore, presents with concern for shortness of breath that worsened over the past 5 hours today.  Reports mild chest pain which he says is baseline for himself.  Denies any pain that radiates to the back.  Denies any swelling in his legs.  He took 5 mg Eliquis this morning.  Has not had his dose of Eliquis tonight.   Chest Pain Associated symptoms: shortness of breath   Shortness of Breath Associated symptoms: chest pain        Home Medications Prior to Admission medications   Medication Sig Start Date End Date Taking? Authorizing Provider  Adalimumab-bwwd (HADLIMA) 40 MG/0.8ML SOSY Inject 40 mg into the skin every 14 (fourteen) days.    [provider]  apixaban (ELIQUIS) 5 MG TABS tablet Take 1 tablet (5 mg total) by mouth 2 (two) times daily. 01/12/21   Fenton, Clint R, PA  Calcium Carbonate-Vit D-Min (CALCIUM 1200 PO) Take 1 tablet by mouth every evening.    [provider]  cetirizine (ZYRTEC) 10 MG tablet Take 10 mg by mouth in the morning.    [provider]  Dorzolamide HCl-Timolol Mal PF 2-0.5 % SOLN Place 1 drop into both eyes at bedtime.    [provider]  empagliflozin (JARDIANCE) 25 MG TABS tablet Take 12.5 mg by mouth daily. 12/26/21   [provider]  ferrous sulfate 325 (65 FE) MG tablet Take 325 mg by mouth every Monday, Wednesday, and Friday.    Ronney Asters, NP  furosemide (LASIX) 20 MG tablet Take 20 mg by mouth 2 (two) times a week. Monday and Thursday    [provider]  galantamine (RAZADYNE ER) 24 MG 24  hr capsule Take 24 mg by mouth in the morning. 08/30/20   [provider]  ibuprofen (ADVIL) 200 MG tablet Take 200-800 mg by mouth every 6 (six) hours as needed for moderate pain.    [provider]  memantine (NAMENDA) 10 MG tablet Take 20 mg by mouth at bedtime. 08/30/20   [provider]  metFORMIN (GLUCOPHAGE-XR) 500 MG 24 hr tablet Take 500 mg by mouth at bedtime.    [provider]  Multiple Vitamins-Minerals (MULTIVITAMIN WITH MINERALS) tablet Take 1 tablet by mouth 3 (three) times a week.    [provider]  nitroGLYCERIN (NITROSTAT) 0.4 MG SL tablet Place 1 tablet (0.4 mg total) under the tongue every 5 (five) minutes as needed. 07/04/21   Arty Baumgartner, NP  omeprazole (PRILOSEC OTC) 20 MG tablet Take 20 mg by mouth daily as needed (Acid reflux).    [provider]  predniSONE (DELTASONE) 5 MG tablet Take 1 tablet by mouth once daily with breakfast 03/07/23   Oretha Milch, MD  ranolazine (RANEXA) 500 MG 12 hr tablet Take 1 tablet (500 mg total) by mouth 2 (two) times daily. 11/11/22   Marykay Lex, MD  rosuvastatin (CRESTOR) 40 MG tablet Take 1 tablet (40 mg total) by mouth daily. 12/28/21   Marykay Lex, MD  tamsulosin (FLOMAX) 0.4 MG CAPS capsule  Take 1 capsule by mouth once daily Patient taking differently: Take 0.4 mg by mouth daily as needed (kidney stones). 03/13/21   McKenzie, Mardene Celeste, MD      Allergies    Hydrocodone, Other, Oxycodone, Sudafed [pseudoephedrine], Tramadol, and Lopid [gemfibrozil]    Review of Systems   Review of Systems  Respiratory:  Positive for shortness of breath.   Cardiovascular:  Positive for chest pain.    Physical Exam Updated Vital Signs BP 123/70   Pulse 63   Temp (!) 97.5 F (36.4 C) (Oral)   Resp (!) 29   SpO2 100%  Physical Exam Vitals and nursing note reviewed.  Constitutional:      General: He is not in acute distress.    Appearance: He is well-developed.  HENT:      Head: Normocephalic and atraumatic.  Eyes:     Conjunctiva/sclera: Conjunctivae normal.  Cardiovascular:     Rate and Rhythm: Normal rate and regular rhythm.     Heart sounds: No murmur heard.    Comments: 2+ radial pulse bilaterally Pulmonary:     Breath sounds: Normal breath sounds.     Comments: Tachypneic.  But talking still in short sentences.  Oxygen at 100% on room air Abdominal:     Palpations: Abdomen is soft.     Tenderness: There is no abdominal tenderness.  Musculoskeletal:        General: No swelling.     Cervical back: Neck supple.  Skin:    General: Skin is warm and dry.     Capillary Refill: Capillary refill takes less than 2 seconds.  Neurological:     Mental Status: He is alert.  Psychiatric:        Mood and Affect: Mood normal.     ED Results / Procedures / Treatments   Labs (all labs ordered are listed, but only abnormal results are displayed) Labs Reviewed  BASIC METABOLIC PANEL - Abnormal; Notable for the following components:      Result Value   CO2 21 (*)    Glucose, Bld 142 (*)    All other components within normal limits  CBC  BRAIN NATRIURETIC PEPTIDE  TROPONIN I (HIGH SENSITIVITY)  TROPONIN I (HIGH SENSITIVITY)    EKG None  Radiology DG Chest 2 View Result Date: 06/02/2023 CLINICAL DATA:  Chest pain EXAM: CHEST - 2 VIEW COMPARISON:  03/29/2023 FINDINGS: Frontal and lateral views of the chest demonstrate an unremarkable cardiac silhouette. No airspace disease, effusion, or pneumothorax. Stable scarring. No acute bony abnormalities. IMPRESSION: 1. No acute intrathoracic process. Electronically Signed   By: Sharlet Salina M.D.   On: 06/02/2023 21:02    Procedures Procedures    Medications Ordered in ED Medications - No data to display  ED Course/ Medical Decision Making/ A&P Clinical Course as of 06/02/23 2226  Mon Jun 02, 2023  1901 Patient stable but tachypneic. Results pending from workup [AF]  2006 Upon recheck of patient, less  tachypneic.  Respiratory rates normal currently.  Talking in full sentences.  Still maintaining on 100% oxygen saturation on room air [AF]    Clinical Course User Index [AF] Arabella Merles, PA-C                                 Medical Decision Making Amount and/or Complexity of Data Reviewed Labs: ordered. Radiology: ordered.  Risk Decision regarding hospitalization.     Differential diagnosis includes  but is not limited to decompensated aortic stenosis, ACS, arrhythmia, aortic aneurysm, pericarditis, myocarditis, pericardial effusion, cardiac tamponade, musculoskeletal pain, GERD, Boerhaave's syndrome, DVT/PE, pneumonia, pleural effusion   ED Course:  Patient comes in extremely tachypneic, but still talking in short sentences.  Satting at 100% on room air.  Other vital signs are stable.  He had severe worsening of his shortness of breath that started about 5 to 6 hours prior to arrival in the ER.  EKG without any ST elevations, initial troponin of 9 and repeat of 9, lower concern for ACS.  EKG with no arrhythmia, lower concern for afib exacerbation that could be causing this dyspnea.  BNP within normal limits, no lower extremity swelling, lower concern for CHF as a cause of his dyspnea and tachypnea.  Lower concern for PE at this time given he is taking Eliquis and he has no tachycardia, oxygen saturation 100%.  BMP and CBC unremarkable. Given history of aortic stenosis, concern for possible decompensated aortic stenosis as the cause of his symptoms currently.  Consulted cardiology, they recommend admitting patient to medicine overnight.  They would like to workup patient further with imaging to evaluate his aortic stenosis tomorrow morning at this could be contributing to his shortness of breath.  They also feel that his baseline lung disease may be contributing to his shortness of breath.   I Ordered, and personally interpreted labs.  The pertinent results include:   CBC within  normal limits BMP unremarkable BNP within normal limits Initial troponin of 9, repeat 9    Impression: Shortness of breath  Disposition:  Admission with Dr. Antionette Char  Imaging Studies ordered: I ordered imaging studies including chest x-ray I independently visualized the imaging with scope of interpretation limited to determining acute life threatening conditions related to emergency care. Imaging showed stable scarring, no acute changes I agree with the radiologist interpretation   Cardiac Monitoring: / EKG: The patient was maintained on a cardiac monitor.  I personally viewed and interpreted the cardiac monitored which showed an underlying rhythm of: Normal sinus rhythm, no ST changes   Consultations Obtained: I requested consultation with the cardiology team,  and discussed lab and imaging findings as well as pertinent plan - they will come to see the patient and provide further recommendations.   External records from outside source obtained and reviewed including cardiology Dr. Herbie Baltimore notes where heart catheterization was performed on 05/27/2023, and H&P noted they will plan on consultation with Dr. Excell Seltzer with a structural heart team in anticipation of TAVR              Final Clinical Impression(s) / ED Diagnoses Final diagnoses:  Shortness of breath  Tachypnea    Rx / DC Orders ED Discharge Orders     None         Arabella Merles, Cordelia Poche 06/02/23 2226    Ernie Avena, MD 06/02/23 2355

## 2023-06-02 NOTE — Telephone Encounter (Signed)
 Called and spoke with wife he states that the patient is short of breath and she needed to speak with me instead of him. Verified that his hands were warm when checking the 02 sat level and she confirmed that yes he warm and she felt that the 77% is an accurate level of his difficulty of breathing. Advised since just had right and left heart cath a few days ago given his cardiac and respiratory history they should proceed to the nearest ED for a full evaluation.  Wife voiced understanding and states that was their plan.

## 2023-06-02 NOTE — ED Provider Triage Note (Signed)
 Emergency Medicine Provider Triage Evaluation Note  Kenneth [name] , a 78 y.o. male  was evaluated in triage.  Pt complains of chest pain and shortness of breath that began today.  Patient very tachypneic on examination.  Patient reports recent right and left heart cath with coronary angiography on 2/18 with Dr. Herbie Baltimore.  Patient is tachypneic on examination, unable to provide proper history as he is too short of breath to even get out a full word.  Charge nurse was made aware that the patient needs room.  Review of Systems  Positive:  Negative:   Physical Exam  There were no vitals taken for this visit. Gen:   Awake, no distress   Resp:  Normal effort  MSK:   Moves extremities without difficulty  Other:    Medical Decision Making  Medically screening exam initiated at 6:20 PM.  Appropriate orders placed.  Kenneth [name] was informed that the remainder of the evaluation will be completed by another provider, this initial triage assessment does not replace that evaluation, and the importance of remaining in the ED until their evaluation is complete.    Al Decant, PA-C 06/02/23 1823

## 2023-06-02 NOTE — Consult Note (Addendum)
 Cardiology Consultation   Patient ID: Kenneth [name] MRN: [id number]; DOB: May 29, 1945  Admit date: 06/02/2023 Date of Consult: 06/02/2023  PCP:  Clinic, [facility] Providers Cardiologist:  Bryan Lemma, MD        Patient Profile:   Kenneth [name] is a 78 y.o. male with a hx of atrial flutter s/p ablation 2006, afib ablation 01/2014, CAD, severe AS, interstitial lung disease followed by Dr. Vassie Loll, HTN, HLD and DM II who is being seen 06/02/2023 for the evaluation of SOB at the request of Dr. Karene Fry.  History of Present Illness:   Kenneth [name] the patient is a 78 year old male with atrial flutter s/p ablation 2006, afib ablation 01/2014, CAD, severe AS, interstitial lung disease followed by Dr. Vassie Loll, HTN, HLD and DM II. Patient had dyspnea on exertion in March 2018, he underwent left and right heart cath that showed mild pulmonary hypertension, severe 95% mid RCA lesion treated with DES, 70% proximal to mid LAD lesion with FFR of 0.75 treated with staged PCI DES, 60% distal left circumflex lesion, 55% OM 3 lesion.  Repeat left and right heart cath performed in July 2022 showed a 70% mid to distal LAD lesion with FFR 0.75 treated with Synergy DES, 60% ostial D1 lesion treated with PTCA.  Repeat left and right heart cath in March 2023 showed significant in-stent restenosis at LAD-D2 bifurcation site treated with PTCA with kissing balloon technique.  Recent echocardiogram performed on 05/14/2023 showed EF 60 to 65%, grade 2 DD, RVSP 34.6 mmHg, moderate LVE, mild MR, moderate TR, mild AI and severe aortic stenosis, dilated aortic root measuring at 41 mm.  He underwent left and right heart cath on 05/27/2023 which showed 50% mid LAD in-stent restenosis, 50% stenosis in sidebranch in D2, 45% distal left circumflex lesion, 50% RPDA lesion, wedge pressure 10 mmHg, PA pressure 28/14 with mean 22 mmHg.  Cardiac output 5.61, cardiac index 2.55.  Patient was further referred to Dr.  Excell Seltzer of structural heart clinic for TAVR evaluation.  Dr. Herbie Baltimore recommended reassess his symptom after TAVR, if still symptomatic, could consider ischemic evaluation of the LAD using a Myoview stress test.  He was instructed to resume Eliquis on 05/27/2023.  Since the recent cath, he continued to have persistent fatigue.  According to family member, he is not very active and gets short of breath after walking long distance.  He became more short of breath today compared to usual.  He had a pinpoint chest pain underneath the left breast.  Chest pain has been persistent for the last 2 hours.  Troponin negative times 1 at night.  BNP normal at 47.9.  Creatinine 1.17.  Hemoglobin 13.2.  EKG showed moderate artifact however underlying rhythm appears to be sinus rhythm without obvious ST-T wave changes.  Pressure and heart rate in the ED was stable, however patient was quite tachypneic despite having O2 saturation 100%.  Family says he has a prior history of heavy breathing.  On physical exam, he has fine crackles in the right base of the lung consistent with history of pulmonary fibrosis.  Chest x-ray also showed elevated right hemidiaphragm, however this is chronic as well when compared to the previous chest x-ray in 2022.   Past Medical History:  Diagnosis Date   Arthritis    CAD S/P percutaneous coronary angioplasty 06/28/2016   Cardiologist- Dr. Herbie Baltimore: 06/28/16 -- [facility] PCI (Synergy DES 2.75 x 20 mm); 07/10/16: Staged p-m LAD PCI (FFR 0.75) -  DES PCI (Synergy DES 3.5 x 20 mm); 10/11/2020: mid-distal LAD 70% (FFR 0.75) DES PCI (Synergy DES 2.5 mm x 32 mm - overlaps prox-mid stent, tapered post-dilation 3.7 - 2.6 mm.;  06/2021 - Cath with severe LAD-D2 bifurcation ISR -- PTCA / kissing balloon - reduced LAD to ~25% & D2 to 40%   Chronic dryness of both eyes    Chronic heart failure with preserved ejection fraction (HFpEF) (HCC) 05/22/2013   Diverticulosis of colon    Epiretinal membrane    "epiretinal  attachment" (06/28/2016) 10-14-2017 per pt oringinally in both , now only unilateral    GERD (gastroesophageal reflux disease)    Hiatal hernia    History of adenomatous polyp of colon    History of concussion 08/23/2017   w/o loc--- per pt no residual   History of kidney stones    History of rheumatic fever 1958   NO Evidence of Vavlular disease -> only mild Aortic Sclerosis   History of skin cancer    excision leg;  froze the left arm--- unsure BCC or SCC    Hyperlipidemia    Hypertension    ILD (interstitial lung disease) (HCC)    pulmologist-  dr Vassie Loll--  secondary to methotrexate use --- last PFTs 04-25-2015  mild restriction   Iron deficiency anemia    Mild obstructive sleep apnea    10-04-2019 per pt  had a sleep study years ago , was told no cpap recommended pt denies    Nephrolithiasis    CT 09-07-2019 bilateral nonobstructive stones   Paroxysmal atrial fibrillation (HCC) 2015   a. 11/2003 Tikosyn initiated - subsequently d/c'd;  b. 2006 RFCA for Afib @ [facility];  c. 09/2011 Echo: EF 60-65%, Gr 2 DD;  d. DCCV 10/2011 , 04/2012, 12/ 2014, & 02/ 2015;  e. RFCA 01-13-2014   Plaque psoriasis 1969   followed by rheumatologist  and [facility] @ [facility] [place]   PONV (postoperative nausea and vomiting)    Right ureteral stone    S/P drug eluting coronary stent placement    06-28-2016  x1 to mRCA;  07-10-2016 x1 to mLAD   Scarring of lung    Seasonal allergies    Short-term memory loss    Type 2 diabetes mellitus (HCC)    followed by pcp   Ureteral calculus, right    Wears hearing aid in both ears     Past Surgical History:  Procedure Laterality Date   APPENDECTOMY  1996   ATRIAL FIBRILLATION ABLATION N/A 01/13/2014   repeat PVI, also left atrial ablation performed with successfull ablation of LA flutter by Dr Johney Frame   ATRIAL FIBRILLATION ABLATION  08/2004   Dr Delena Serve at [facility])   CARDIAC STRESS PET  03/12/2022   Normal perfusion study/low risk.  Normal  LVEF and normal LVEF preserved.  Suspect reduced myocardial blood flow reserve related to microvascular disease.  No evidence of infarction or ischemia.  Resting EF 48%.  Stress EF 54%.  Mildly enlarged LV. Asc Ao 4.1 cm--stable.  Also stable mosaic attenuation in the lungs no change.   CARDIOVERSION N/A 05/22/2013   Procedure: CARDIOVERSION;  Surgeon: Hillis Range, MD;  Location: [facility] OR;  Service: Cardiovascular;  Laterality: N/A;   CARDIOVERSION N/A 10/23/2011   Procedure: CARDIOVERSION;  Surgeon: Duke Salvia, MD;  Location: [facility] CATH LAB;  Service: Cardiovascular;  Laterality: N/A;   CARDIOVERSION N/A 05/01/2012   Procedure: CARDIOVERSION;  Surgeon: Duke Salvia, MD;  Location: [facility]  CATH LAB;  Service: Cardiovascular;  Laterality: N/A;   CARPAL TUNNEL RELEASE Right 1980s   CATARACT EXTRACTION W/ INTRAOCULAR LENS  IMPLANT, BILATERAL  2012  approx.   CORONARY BALLOON ANGIOPLASTY N/A 07/04/2021   Procedure: CORONARY BALLOON ANGIOPLASTY;  Surgeon: Marykay Lex, MD;  Location: [facility] INVASIVE CV LAB;  Service: Cardiovascular;; scoring balloon and an [place] balloon PTCA of LAD; with kissing balloon PTCA of LAD-D2 bifurcation stented segment reducing LAD to 20 % ISR and ostial D2 to 40%.   CORONARY PRESSURE/FFR STUDY N/A 06/28/2016   Procedure: Intravascular Pressure Wire/FFR Study;  Surgeon: Marykay Lex, MD;  Location: [facility] INVASIVE CV LAB;  Service: Cardiovascular: FFR of mLAD ~65% lesion = 0.75 post --> Staged PCI   CORONARY PRESSURE/FFR STUDY N/A 10/11/2020   Procedure: INTRAVASCULAR PRESSURE WIRE/FFR STUDY;  Surgeon: Marykay Lex, MD;  Location: [facility] INVASIVE CV LAB;  Service: Cardiovascular;  Laterality: LAD mid-distal just after D2 --> FFR 0.75 SIGNIFICANT => PCI   CORONARY STENT INTERVENTION N/A 06/28/2016   Procedure: Coronary Stent Intervention;  Surgeon: Marykay Lex, MD;  Location: [facility] INVASIVE CV LAB;  Service: Cardiovascular;  Laterality: mRCA PCI -Synergy DES 2.75 m x 20 mm (3.1 mm)    CORONARY STENT INTERVENTION N/A 07/10/2016   Procedure: Coronary Stent Intervention;  Surgeon: Marykay Lex, MD;  Location: [facility] INVASIVE CV LAB;  Service: Cardiovascular: mLAD PCI Synergy DES 3.5 mm x 20 mm)   CORONARY STENT INTERVENTION N/A 10/11/2020   Procedure: CORONARY STENT INTERVENTION;  Surgeon: Marykay Lex, MD;  Location: [facility] INVASIVE CV LAB;  Service: Cardiovascular: mid-distal LAD ~70% (FFR 0.75): SYNERGY DES 2.5 MM X 32 MM (overlaps prox-mid Stent from 07/2016) - Tapered post-dilation 3.7 mm ->2.6 mm; Jailed D2 PTCA reduced 60% to 40%.   CYSTOSCOPY WITH RETROGRADE PYELOGRAM, URETEROSCOPY AND STENT PLACEMENT Right 10/16/2017   Procedure: CYSTOSCOPY WITH RIGHT RETROGRADE RIGHT URETEROSCOPY AND STONE BASKET EXTRACTION;  Surgeon: Bjorn Pippin, MD;  Location: [facility];  Service: Urology;  Laterality: Right;   CYSTOSCOPY WITH RETROGRADE PYELOGRAM, URETEROSCOPY AND STENT PLACEMENT Right 10/08/2019   Procedure: CYSTOSCOPY WITH URETEROSCOPY; RETROGRADE PYELOGRAM; STONE BASKET EXTRACTION;  STENT PLACEMENT;  Surgeon: Malen Gauze, MD;  Location: [facility];  Service: Urology;  Laterality: Right;  1 HR   CYSTOSCOPY WITH RETROGRADE PYELOGRAM, URETEROSCOPY AND STENT PLACEMENT Right 03/28/2020   Procedure: CYSTOSCOPY WITH RIGHT RETROGRADE PYELOGRAM, URETEROSCOPY WITH HOLMIUM LASER AND STENT PLACEMENT;  Surgeon: Bjorn Pippin, MD;  Location: [facility];  Service: Urology;  Laterality: Right;   CYSTOSCOPY WITH RETROGRADE PYELOGRAM, URETEROSCOPY AND STENT PLACEMENT Left 10/22/2021   Procedure: CYSTOSCOPY WITH LEFT RETROGRADE URETEROSCOPY WIHT HOLMIUM LASERAND STENT PLACEMENT;  Surgeon: Bjorn Pippin, MD;  Location: [facility];  Service: Urology;  Laterality: Left;   CYSTOSCOPY/RETROGRADE/URETEROSCOPY/STONE EXTRACTION WITH BASKET Right 10/27/2017   Procedure: CYSTOSCOPY/RETROGRADE/URETEROSCOPY/STONE EXTRACTION WITH BASKET/URETERAL STENT PLACEMENT and laser;  Surgeon: Bjorn Pippin, MD;  Location: [facility];  Service: Urology;  Laterality: Right;   CYSTOSCOPY/URETEROSCOPY/HOLMIUM LASER/STENT PLACEMENT Right 01/03/2023   Procedure: CYSTOSCOPY RIGHT RETROGRADE URETEROSCOPY/HOLMIUM LASER/STENT PLACEMENT;  Surgeon: Bjorn Pippin, MD;  Location: [facility];  Service: Urology;  Laterality: Right;   EXTRACORPOREAL SHOCK WAVE LITHOTRIPSY Left 08/10/2020   Procedure: EXTRACORPOREAL SHOCK WAVE LITHOTRIPSY (ESWL);  Surgeon: Crist Fat, MD;  Location: [facility];  Service: Urology;  Laterality: Left;   EXTRACORPOREAL SHOCK WAVE LITHOTRIPSY Right 12/16/2022   Procedure: RIGHT EXTRACORPOREAL SHOCK WAVE LITHOTRIPSY (ESWL);  Surgeon: Bjorn Pippin, MD;  Location: [facility];  Service: Urology;  Laterality: Right;   HOLMIUM LASER APPLICATION Right 10/27/2017   Procedure: POSSIBLE HOLMIUM LASER APPLICATION;  Surgeon: Bjorn Pippin, MD;  Location: [facility];  Service: Urology;  Laterality: Right;   HOLMIUM LASER APPLICATION Right 10/08/2019   Procedure: HOLMIUM LASER APPLICATION;  Surgeon: Malen Gauze, MD;  Location: [facility];  Service: Urology;  Laterality: Right;   LEFT HEART CATH AND CORONARY ANGIOGRAPHY  06/30/2003   Dr. Riley Kill: NO Significant/Obstructive CAD; Preseved LVEF (for Recurrent Afib)   NASAL SINUS SURGERY     x 2   NM MYOVIEW LTD  02/11/2021   Lexiscan:EF 55-65%.  Normal function.  No ischemia or infarction.  No EKG changes noted.   PROXIMAL INTERPHALANGEAL FUSION (PIP) Left 01-05-2001    dr graves   correction clawtoe and extensor tendon lengthening   RIGHT/LEFT HEART CATH AND CORONARY ANGIOGRAPHY N/A 06/28/2016   Procedure: Right/Left Heart Cath and Coronary Angiography;  Surgeon: Marykay Lex, MD;  Location: [facility] INVASIVE CV LAB;  Service: Cardiovascular: mRCA 99% (TIMI 2), mLAD ~65% (FFR 0.75), OM3 55%. mild Pulm HTN. Mod LVEDP elevation. EF 55-65%.   RIGHT/LEFT HEART CATH AND CORONARY ANGIOGRAPHY N/A 10/11/2020    Procedure: RIGHT/LEFT HEART CATH AND CORONARY ANGIOGRAPHY;  Surgeon: Marykay Lex, MD;  Location: [facility] INVASIVE CV LAB;; Patent DES in mRCA & p-mLAD. CULPRIT = m-d LAD 70% (just after D1 w/ Ost 60%) FFR 0.75 => DES PCI m-dLAD & PTCA ost D1 (overlapping prior stent distally & crossing D2 w/  Ost D2 post PTCA). RIGHT HEART CATH PRESSURES,  & CO-CI ARE NORMAL. NO PULM HTN   RIGHT/LEFT HEART CATH AND CORONARY ANGIOGRAPHY N/A 07/04/2021   Procedure: RIGHT/LEFT HEART CATH AND CORONARY ANGIOGRAPHY;  Surgeon: Marykay Lex, MD;  Location: [facility] INVASIVE CV LAB;  Service: Cardiovascular;; moderately elevated LVEDP of 20 mmHg, and PCWP-28 mmHg -> mean PAP of 24 mmHg.  Moderate AS-mean gradient 33 mmHg.  (Similar to Echo 26 mmHg); borderline low BP.  CO-CI 9.2 and 4.0.. Stable CAD W/ progression of ISR of LAD stent at D2 => PCI   RIGHT/LEFT HEART CATH AND CORONARY ANGIOGRAPHY N/A 05/27/2023   Procedure: RIGHT/LEFT HEART CATH AND CORONARY ANGIOGRAPHY;  Surgeon: Marykay Lex, MD;  Location: [facility] INVASIVE CV LAB;  Service: Cardiovascular;  Laterality: N/A;   ROTATOR CUFF REPAIR Bilateral last one 2014   TONSILLECTOMY  age 57   TRANSTHORACIC ECHOCARDIOGRAM  09/29/2020   EF > 75%, Hyperdynamic. No RWMA. Mild LVH. ~ DD with mild LA dilation. Mild AI. Normal RV size & fxn, ? Dilated IVC c/w CVP ~ 15 mHg.   TRANSTHORACIC ECHOCARDIOGRAM  04/12/2021   EF estimated 65%.  Normal LV size and function.  No RWMA.  Mild LVH.  GRII DD.  Elevated LVEDP (c/w HFpEF).  Normal RV size.  Mildly elevated PAP estimated 36 mmHg.  Mild MR.  Mild AS (mean gradient 26 mmHg.  DI 0.32.  Aortic root 40 mm, ascending aorta 41 mm.   URETEROSCOPIC LASER LITHOTRIPSY STONE EXTRACTIONS/  STENT PLACEMENT Bilateral 09-10-2007   dr Annabell Howells  [facility]   per pt has had several prior ureteroscopic stone extraction since age 66 , last one 09-10-2007   Zio patch monitor:  02/2021   Probably Sinus Rhythm: Heart rate 56-146 bpm, average 83 bpm.  Frequent PACs  noted with rare isolated PVCs.  Multiple episodes of PAT/atrial runs-longest 23 seconds average heart rate 95 bpm, fastest 5 beats  at a max rate of 231 bpm.  No evidence of A-fib or atrial flutter.  No prolonged or sustained SVT/PAT.     Home Medications:  Prior to Admission medications   Medication Sig Start Date End Date Taking? Authorizing Provider  Adalimumab-bwwd (HADLIMA) 40 MG/0.8ML SOSY Inject 40 mg into the skin every 14 (fourteen) days.    [provider]  apixaban (ELIQUIS) 5 MG TABS tablet Take 1 tablet (5 mg total) by mouth 2 (two) times daily. 01/12/21   Fenton, Clint R, PA  Calcium Carbonate-Vit D-Min (CALCIUM 1200 PO) Take 1 tablet by mouth every evening.    [provider]  cetirizine (ZYRTEC) 10 MG tablet Take 10 mg by mouth in the morning.    [provider]  Dorzolamide HCl-Timolol Mal PF 2-0.5 % SOLN Place 1 drop into both eyes at bedtime.    [provider]  empagliflozin (JARDIANCE) 25 MG TABS tablet Take 12.5 mg by mouth daily. 12/26/21   [provider]  ferrous sulfate 325 (65 FE) MG tablet Take 325 mg by mouth every Monday, Wednesday, and Friday.    Ronney Asters, NP  furosemide (LASIX) 20 MG tablet Take 20 mg by mouth 2 (two) times a week. Monday and Thursday    [provider]  galantamine (RAZADYNE ER) 24 MG 24 hr capsule Take 24 mg by mouth in the morning. 08/30/20   [provider]  ibuprofen (ADVIL) 200 MG tablet Take 200-800 mg by mouth every 6 (six) hours as needed for moderate pain.    [provider]  memantine (NAMENDA) 10 MG tablet Take 20 mg by mouth at bedtime. 08/30/20   [provider]  metFORMIN (GLUCOPHAGE-XR) 500 MG 24 hr tablet Take 500 mg by mouth at bedtime.    [provider]  Multiple Vitamins-Minerals (MULTIVITAMIN WITH MINERALS) tablet Take 1 tablet by mouth 3 (three) times a week.    [provider]  nitroGLYCERIN (NITROSTAT) 0.4 MG SL tablet Place  1 tablet (0.4 mg total) under the tongue every 5 (five) minutes as needed. 07/04/21   Arty Baumgartner, NP  omeprazole (PRILOSEC OTC) 20 MG tablet Take 20 mg by mouth daily as needed (Acid reflux).    [provider]  predniSONE (DELTASONE) 5 MG tablet Take 1 tablet by mouth once daily with breakfast 03/07/23   Oretha Milch, MD  ranolazine (RANEXA) 500 MG 12 hr tablet Take 1 tablet (500 mg total) by mouth 2 (two) times daily. 11/11/22   Marykay Lex, MD  rosuvastatin (CRESTOR) 40 MG tablet Take 1 tablet (40 mg total) by mouth daily. 12/28/21   Marykay Lex, MD  tamsulosin (FLOMAX) 0.4 MG CAPS capsule Take 1 capsule by mouth once daily Patient taking differently: Take 0.4 mg by mouth daily as needed (kidney stones). 03/13/21   McKenzie, Mardene Celeste, MD    Inpatient Medications: Scheduled Meds:  Continuous Infusions:  PRN Meds:   Allergies:    Allergies  Allergen Reactions   Hydrocodone Shortness Of Breath    In combination with decongestants   Other Palpitations and Other (See Comments)    ALL DECONGESTANTS - CAUSE PALPITATIONS; THROWS HEART RHYTHM OUT OF BALANCE   Oxycodone Shortness Of Breath   Sudafed [Pseudoephedrine] Palpitations   Tramadol Shortness Of Breath and Rash    flushing   Lopid [Gemfibrozil] Other (See Comments)    Dizziness      Social History:   Social History   Socioeconomic History  Marital status: Married    Spouse name: Not on file   Number of children: 1   Years of education: Not on file   Highest education level: Not on file  Occupational History   Occupation: Retired Psychologist, sport and exercise  Tobacco Use   Smoking status: Former    Types: Cigars    Quit date: 09/29/1997    Years since quitting: 25.6   Smokeless tobacco: Former    Types: Snuff, Chew   Tobacco comments:    Form smoker and chew tobacco 01/25/2021  Vaping Use   Vaping status: Never Used  Substance and Sexual Activity   Alcohol use: No   Drug use: No   Sexual activity:  Not on file  Other Topics Concern   Not on file  Social History Narrative   Pt lives in [place] with spouse.     Retired.    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 -> subsequently worked in [employer] (seconded to [employer]).       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        Had been very active as an Charity fundraiser"  - now doing his best to "stay out of this mess of politics" -- has declined efforts to get him to run for office.      Now is primary caregiver for his wife [name]) - who has metastatic CA & has had CVA x 2.  Pretty much "total care".  Very emotionally taxing.      He has plans to build homes for all of his children & grandchildren on their property - to ensure they all have a place to live - Not transferable   Social Drivers of Corporate investment banker Strain: Not on file  Food Insecurity: No Food Insecurity (10/18/2021)   Received from [facility]   Hunger Vital Sign    Worried About Running Out of Food in the Last Year: Never true    Ran Out of Food in the Last Year: Never true  Transportation Needs: Not on file  Physical Activity: Not on file  Stress: Not on file  Social Connections: Unknown (08/17/2021)   Received from [facility], [facility]   Social Network    Social Network: Not on file  Intimate Partner Violence: Unknown (07/10/2021)   Received from [facility]   HITS    Physically Hurt: Not on file    Insult or Talk Down To: Not on file    Threaten Physical Harm: Not on file    Scream or Curse: Not on file    Family History:    Family History  Problem Relation Age of Onset   Coronary artery disease Mother    Breast cancer Mother    Colon cancer Mother    Heart disease Maternal Grandmother        MI   Stroke Paternal Grandfather        CVA   Coronary artery disease Paternal Grandfather    Esophageal cancer  Neg Hx    Stomach cancer Neg Hx    Rectal cancer Neg Hx      ROS:  Please see the history of present illness.   All other ROS reviewed and negative.     Physical Exam/Data:   Vitals:   06/02/23 1855 06/02/23 1930 06/02/23 2000 06/02/23 2015  BP:  114/67 107/62 108/65  Pulse: 71  66 64 66  Resp: (!) 45 (!) 27 15 18   Temp:      SpO2: 100% 100% 100% 100%   No intake or output data in the 24 hours ending 06/02/23 2045    05/27/2023    9:33 AM 05/14/2023    9:24 AM 03/29/2023    9:10 PM  Last 3 Weights  Weight (lbs) 200 lb 206 lb 12.8 oz 210 lb 5.1 oz  Weight (kg) 90.719 kg 93.804 kg 95.4 kg     There is no height or weight on file to calculate BMI.  General:  Well nourished, well developed, in no acute distress HEENT: normal Neck: no JVD Vascular: No carotid bruits; Distal pulses 2+ bilaterally Cardiac:  normal S1, S2; RRR; no murmur  Lungs:  clear to auscultation bilaterally, no wheezing, rhonchi or rales  Abd: soft, nontender, no hepatomegaly  Ext: no edema Musculoskeletal:  No deformities, BUE and BLE strength normal and equal Skin: warm and dry  Neuro:  CNs 2-12 intact, no focal abnormalities noted Psych:  Normal affect   EKG:  The EKG was personally reviewed and demonstrates: Normal sinus rhythm, no significant ST-T wave changes Telemetry:  Telemetry was personally reviewed and demonstrates: Normal sinus rhythm, no significant ventricular ectopy  Relevant CV Studies:  Echo 05/14/2023 1. Left ventricular ejection fraction, by estimation, is 60 to 65%. The  left ventricle has normal function. The left ventricle has no regional  wall motion abnormalities. There is mild concentric left ventricular  hypertrophy. Left ventricular diastolic  parameters are consistent with Grade II diastolic dysfunction  (pseudonormalization).   2. Right ventricular systolic function is normal. The right ventricular  size is normal. There is normal pulmonary artery systolic pressure. The   estimated right ventricular systolic pressure is 34.6 mmHg.   3. Left atrial size was moderately dilated.   4. The mitral valve is degenerative. Mild mitral valve regurgitation. No  evidence of mitral stenosis. Moderate mitral annular calcification.   5. The tricuspid valve is abnormal. Tricuspid valve regurgitation is  moderate.   6. The aortic valve is tricuspid. There is severe calcifcation of the  aortic valve. Aortic valve regurgitation is mild. Severe aortic valve  stenosis. Aortic valve area, by VTI measures 0.88 cm. Aortic valve mean  gradient measures 42.0 mmHg.   7. Aortic dilatation noted. There is mild dilatation of the ascending  aorta, measuring 41 mm.   8. The inferior vena cava is normal in size with greater than 50%  respiratory variability, suggesting right atrial pressure of 3 mmHg.     Cath 05/27/2023  Previously placed Prox LAD to Mid LAD stent of unknown type is  widely patent the second overlapping mid LAD stent has 50% in-stent restenosis (at the previous PTCA site) with 55% stenosed side branch in 2nd Diag.   Dist Cx lesion is 45% stenosed.   Previously placed Mid RCA stent of unknown type is  widely patent.   RPDA lesion is 50% stenosed.   Normal Right Heart Cath Numbers/Pressures    Severe Aortic Stenosis by Echocardiogram (able to cross with wire but not with catheter due to tortuosity in the innominate artery. Widely patent RCA stents with left 50% ISR of the LAD stent at the previous PTCA site-Diag bifurcation with 55% ostial Diag stenosis (does not appear to be flow-limiting) here Relatively Normal Right Heart Cath Pressures and Measurements: -RAP 5 mmHg, RV P-EDP 27/2-6 mmHg; PAP-mean 28/14-22 mmHg, PCWP 10 million mercury.  -AO P-MAP 119/65-91 mmHg. -  Ao sat 99%, PA sat 70%; Fick Cardiac Output 5.61-2.55.      RECOMMENDATIONS Discharge home today after bedrest.  Has plan to follow-up with Dr. Excell Seltzer in the Aortic Valve Clinic for TAVR  evaluation Reassess symptoms after TAVR, if still symptomatic, could consider ischemic evaluation of the LAD with a Myoview stress test. Restart Eliquis p.m. 05/27/2023; restart Jardiance 05/28/2023; hold metformin 48 hours post cath    Laboratory Data:  High Sensitivity Troponin:   Recent Labs  Lab 06/02/23 1815  TROPONINIHS 9     Chemistry Recent Labs  Lab 05/27/23 1502 05/27/23 1503 06/02/23 1815  NA 141 142 137  K 3.8 3.7 4.2  CL  --   --  105  CO2  --   --  21*  GLUCOSE  --   --  142*  BUN  --   --  15  CREATININE  --   --  1.17  CALCIUM  --   --  9.4  GFRNONAA  --   --  >60  ANIONGAP  --   --  11    No results for input(s): "PROT", "ALBUMIN", "AST", "ALT", "ALKPHOS", "BILITOT" in the last 168 hours. Lipids No results for input(s): "CHOL", "TRIG", "HDL", "LABVLDL", "LDLCALC", "CHOLHDL" in the last 168 hours.  Hematology Recent Labs  Lab 05/27/23 1502 05/27/23 1503 06/02/23 1815  WBC  --   --  8.2  RBC  --   --  4.56  HGB 12.6* 12.6* 13.2  HCT 37.0* 37.0* 40.4  MCV  --   --  88.6  MCH  --   --  28.9  MCHC  --   --  32.7  RDW  --   --  13.6  PLT  --   --  187   Thyroid No results for input(s): "TSH", "FREET4" in the last 168 hours.  BNP Recent Labs  Lab 06/02/23 1815  BNP 47.9    DDimer No results for input(s): "DDIMER" in the last 168 hours.   Radiology/Studies:  No results found.   Assessment and Plan:   Shortness of breath with tachypnea: O2 saturation 100% on room air.  Patient does not appears to be volume overloaded.  He is on 20 mg twice weekly dosing of Lasix every Monday and Thursday.  Wedge pressure is normal on recent cath a week ago.  He is on Eliquis, suspicion for PE fairly low.  He has chronic emphysema and interstitial pulmonary fibrosis followed by pulmonology service and elevated right hemidiaphragm.  I am not confident his shortness of breath is truly cardiac.  He does have chronic shortness of breath with exertion and fatigue,  though symptoms consistent with severe aortic stenosis, however I did not find any culprit to explain the sudden worsening shortness of breath today.  Discussed the case with Dr. Jacques Navy who recommended medicine to admit and evaluate for IPF flare.  During the meantime, we will proceed with pre-TAVR workup with CT image and potentially have structural heart team to weigh in during this hospitalization.  Pinpoint chest pain: Recent cardiac catheterization is reassuring with stable coronary anatomy.  Had a 50% in-stent restenosis in mid LAD.  Chest pain has been going on for 2 hours, EKG is normal.  First troponin is negative.  Pending second troponin.  If second troponin is negative, I do not think this is cardiac chest pain.  Severe aortic stenosis: See #1   CAD: See #2.  Recent cardiac catheterization showed stable coronary anatomy  A-fib/atrial flutter s/p ablation: No recurrence.  On Eliquis  Interstitial pulmonary fibrosis  Hypertension  Hyperlipidemia  DM2    Risk Assessment/Risk Scores:                For questions or updates, please contact [facility] Please consult [email or web address] for contact info under    Ramond Dial, [place]  06/02/2023 8:45 PM  Patient seen and examined with Azalee Course PA.  Agree as above, with the following exceptions and changes as noted below.  Patient is a 78 year old male with a history of atrial fibrillation/atrial flutter status post ablation, CAD with recent cardiac catheterization with no new obstructive findings, severe aortic valve stenosis with a mean gradient of 42 mmHg, interstitial lung disease followed by pulmonology who presents with paroxysms of shortness of breath.  He participated in his activities of daily living today including going to the drugstore to pick up medications and put birdfeeders out, his wife notes that he has been significantly short of breath since about 3 PM.  He is chronically anticoagulated and likelihood  of PE is low.  Most recent echocardiogram and cardiac catheterization both document severe aortic valve stenosis and this is confirmed by exam as well.  He is uncertain why he has become short of breath as of recently, we discussed etiologies including severe AS and interstitial lung disease. Gen: NAD, CV: RRR, 3/6 late peaking systolic ejection murmur heard throughout the precordium, obscures S2, musical quality, Lungs: Bilateral diffuse crackles, Abd: soft, Extrem: Warm, well perfused, no edema, Neuro/Psych: alert and oriented x 3, normal mood and affect. All available labs, radiology testing, previous records reviewed.  Patient presents to the ER with several hours of shortness of breath with normal BNP and normal troponins.  Recent cardiac catheterization with no obstructive lesions however there was in-stent restenosis noted, and severe aortic valve stenosis documented.  The etiology of his paroxysmal shortness of breath is not entirely clear yet, but he intends to meet with the structural heart team in 2 weeks to further evaluate.  Now that he is presented to the emergency department with dyspnea on exertion we will obtain some of his testing while here, we will obtain TAVR scans tomorrow.  If available, we will consider structural heart consultation while inpatient.  Cardiac catheterization has been noted as above with normal right heart pressures.  He does have some chest pain but it is pinpoint over the left precordium, worsens with movement, and is otherwise atypical.  Heart rate and blood pressure normal, continue home medications including apixaban.  Parke Poisson, MD 06/02/23 9:26 PM

## 2023-06-02 NOTE — ED Triage Notes (Signed)
 Pt reports onset of CP and SHOB x 1 hour. Recent heart catheterization on Tuesday with stents placed. Hx of Afib and CHF.

## 2023-06-03 ENCOUNTER — Observation Stay (HOSPITAL_BASED_OUTPATIENT_CLINIC_OR_DEPARTMENT_OTHER): Payer: Medicare Other

## 2023-06-03 ENCOUNTER — Encounter (HOSPITAL_COMMUNITY): Payer: Self-pay | Admitting: Family Medicine

## 2023-06-03 DIAGNOSIS — I7121 Aneurysm of the ascending aorta, without rupture: Secondary | ICD-10-CM | POA: Diagnosis not present

## 2023-06-03 DIAGNOSIS — N200 Calculus of kidney: Secondary | ICD-10-CM | POA: Diagnosis not present

## 2023-06-03 DIAGNOSIS — I350 Nonrheumatic aortic (valve) stenosis: Secondary | ICD-10-CM

## 2023-06-03 DIAGNOSIS — R0602 Shortness of breath: Secondary | ICD-10-CM | POA: Diagnosis not present

## 2023-06-03 DIAGNOSIS — Z01818 Encounter for other preprocedural examination: Secondary | ICD-10-CM | POA: Diagnosis not present

## 2023-06-03 LAB — CBC
HCT: 35.5 % — ABNORMAL LOW (ref 39.0–52.0)
Hemoglobin: 11.9 g/dL — ABNORMAL LOW (ref 13.0–17.0)
MCH: 29.5 pg (ref 26.0–34.0)
MCHC: 33.5 g/dL (ref 30.0–36.0)
MCV: 87.9 fL (ref 80.0–100.0)
Platelets: 160 10*3/uL (ref 150–400)
RBC: 4.04 MIL/uL — ABNORMAL LOW (ref 4.22–5.81)
RDW: 13.5 % (ref 11.5–15.5)
WBC: 7.2 10*3/uL (ref 4.0–10.5)
nRBC: 0 % (ref 0.0–0.2)

## 2023-06-03 LAB — CBG MONITORING, ED
Glucose-Capillary: 135 mg/dL — ABNORMAL HIGH (ref 70–99)
Glucose-Capillary: 92 mg/dL (ref 70–99)

## 2023-06-03 LAB — BASIC METABOLIC PANEL
Anion gap: 6 (ref 5–15)
BUN: 15 mg/dL (ref 8–23)
CO2: 24 mmol/L (ref 22–32)
Calcium: 8.5 mg/dL — ABNORMAL LOW (ref 8.9–10.3)
Chloride: 108 mmol/L (ref 98–111)
Creatinine, Ser: 1.01 mg/dL (ref 0.61–1.24)
GFR, Estimated: >60 mL/min (ref >60)
Glucose, Bld: 116 mg/dL — ABNORMAL HIGH (ref 70–99)
Potassium: 3.7 mmol/L (ref 3.5–5.1)
Sodium: 138 mmol/L (ref 135–145)

## 2023-06-03 LAB — GLUCOSE, CAPILLARY
Glucose-Capillary: 85 mg/dL (ref 70–99)
Glucose-Capillary: 150 mg/dL — ABNORMAL HIGH (ref 70–99)

## 2023-06-03 LAB — RESP PANEL BY RT-PCR (RSV, FLU A&B, COVID)  RVPGX2
Influenza A by PCR: NEGATIVE
Influenza B by PCR: NEGATIVE
Resp Syncytial Virus by PCR: NEGATIVE
SARS Coronavirus 2 by RT PCR: NEGATIVE

## 2023-06-03 LAB — MAGNESIUM: Magnesium: 2.3 mg/dL (ref 1.7–2.4)

## 2023-06-03 LAB — D-DIMER, QUANTITATIVE: D-Dimer, Quant: <0.27 ug{FEU}/mL (ref 0.00–0.50)

## 2023-06-03 MED ORDER — APIXABAN 5 MG PO TABS
5.0000 mg | ORAL_TABLET | Freq: Two times a day (BID) | ORAL | Status: DC
  Administered 2023-06-03: 5 mg via ORAL
  Filled 2023-06-03: qty 1

## 2023-06-03 MED ORDER — SODIUM CHLORIDE 0.9% FLUSH: 3.0000 mL | Freq: Two times a day (BID) | INTRAVENOUS | Status: DC

## 2023-06-03 MED ORDER — FUROSEMIDE 20 MG PO TABS: 20.0000 mg | ORAL_TABLET | ORAL | Status: DC

## 2023-06-03 MED ORDER — ACETAMINOPHEN 325 MG PO TABS: 650.0000 mg | ORAL_TABLET | Freq: Four times a day (QID) | ORAL | Status: DC | PRN

## 2023-06-03 MED ORDER — ONDANSETRON HCL 4 MG PO TABS: 4.0000 mg | ORAL_TABLET | Freq: Four times a day (QID) | ORAL | Status: DC | PRN

## 2023-06-03 MED ORDER — IOHEXOL 350 MG/ML SOLN
125.0000 mL | Freq: Once | INTRAVENOUS | Status: AC | PRN
  Administered 2023-06-03: 125 mL via INTRAVENOUS

## 2023-06-03 MED ORDER — EMPAGLIFLOZIN 10 MG PO TABS
10.0000 mg | ORAL_TABLET | Freq: Every day | ORAL | Status: DC
  Administered 2023-06-03: 10 mg via ORAL
  Filled 2023-06-03: qty 1

## 2023-06-03 MED ORDER — SENNOSIDES-DOCUSATE SODIUM 8.6-50 MG PO TABS: 1.0000 | ORAL_TABLET | Freq: Every evening | ORAL | Status: DC | PRN

## 2023-06-03 MED ORDER — INSULIN ASPART 100 UNIT/ML IJ SOLN: 0.0000 [IU] | Freq: Every day | INTRAMUSCULAR | Status: DC

## 2023-06-03 MED ORDER — MEMANTINE HCL 10 MG PO TABS: 20.0000 mg | ORAL_TABLET | Freq: Every day | ORAL | Status: DC

## 2023-06-03 MED ORDER — ONDANSETRON HCL 4 MG/2ML IJ SOLN: 4.0000 mg | Freq: Four times a day (QID) | INTRAMUSCULAR | Status: DC | PRN

## 2023-06-03 MED ORDER — PREDNISONE 5 MG PO TABS
5.0000 mg | ORAL_TABLET | Freq: Every day | ORAL | Status: DC
  Administered 2023-06-03: 5 mg via ORAL
  Filled 2023-06-03: qty 1

## 2023-06-03 MED ORDER — ACETAMINOPHEN 650 MG RE SUPP: 650.0000 mg | Freq: Four times a day (QID) | RECTAL | Status: DC | PRN

## 2023-06-03 MED ORDER — ROSUVASTATIN CALCIUM 20 MG PO TABS
40.0000 mg | ORAL_TABLET | Freq: Every day | ORAL | Status: DC
  Administered 2023-06-03: 40 mg via ORAL
  Filled 2023-06-03: qty 2

## 2023-06-03 MED ORDER — IPRATROPIUM-ALBUTEROL 0.5-2.5 (3) MG/3ML IN SOLN: 3.0000 mL | Freq: Once | RESPIRATORY_TRACT | Status: DC | PRN

## 2023-06-03 MED ORDER — INSULIN ASPART 100 UNIT/ML IJ SOLN: 0.0000 [IU] | Freq: Three times a day (TID) | INTRAMUSCULAR | Status: DC

## 2023-06-03 MED ORDER — RANOLAZINE ER 500 MG PO TB12
500.0000 mg | ORAL_TABLET | Freq: Two times a day (BID) | ORAL | Status: DC
  Administered 2023-06-03: 500 mg via ORAL
  Filled 2023-06-03: qty 1

## 2023-06-03 MED ORDER — GALANTAMINE HYDROBROMIDE ER 8 MG PO CP24
24.0000 mg | ORAL_CAPSULE | Freq: Every morning | ORAL | Status: DC
  Administered 2023-06-03: 24 mg via ORAL
  Filled 2023-06-03: qty 3

## 2023-06-03 NOTE — Discharge Summary (Signed)
 Physician Discharge Summary   Patient: Kenneth [name] MRN: [id number] DOB: March 07, 1946  Admit date:     06/02/2023  Discharge date: 06/03/23  Discharge Physician: Deanna Artis   PCP: [facility]   Recommendations at discharge:    Pt to be discharged home.   If you experience worsening fever, chills, chest pain, shortness of breath, or other concerning symptoms, please call your PCP or go to the emergency department immediately.   Discharge Diagnoses: Principal Problem:   SOB (shortness of breath) Active Problems:   Coronary artery disease involving native coronary artery with angina pectoris (HCC)   Paroxysmal atrial fibrillation (HCC)   ILD (interstitial lung disease) (HCC)   Controlled type 2 diabetes mellitus without complication, without long-term current use of insulin (HCC)   Essential hypertension   Severe aortic stenosis by prior echocardiogram  Resolved Problems:   * No resolved hospital problems. *  Hospital Course: 78 y.o. male with medical history significant for type 2 diabetes mellitus, CAD, chronic HFpEF, atrial fibrillation on Eliquis, ILD, and severe aortic stenosis who presents for evaluation of chest pain and shortness of breath.   Assessment and Plan: Atypical chest pain with dyspnea - Chest x-ray personally reviewed showing no acute opacifications.  Multifactorial dyspnea given patient's underlying interstitial lung disease, CAD, and severe aortic stenosis.  Chest pain appears to be resolved at this time.  Troponin is negative.  Just had a cath 2/18 showing stable coronary anatomy.  CT angio noting no signs of PE nor acute pneumonia, only chronic ILD.  However his dyspnea/DOE still a bit persistent.  Likely his severe aortic stenosis contributing to the majority of his symptoms.  Cardiology following closely.  Previously been recommended for TAVR earlier this month, undergoing preliminary workup by cardiology now.  At this time, pt to be discharged  home with f/u appt tomorrow with Dr. Laneta Simmers at 3pm.  Likely TAVR next week.    Severe aortic stenosis - Echo 2/5 noting EF 60-65%, grade 2 DD, severe aortic stenosis with a mean gradient 42.  TAVR workup by structural heart team.   Atrial fibrillation/atrial flutter - Status post ablation.  Continues on Eliquis.  Appears well-controlled.   Interstitial pulmonary fibrosis - Does not appear to be acutely exacerbated, no worsening hypoxia.  Continues on low-dose prednisone.   Diabetes mellitus -resume home regimen   Hypertension/hyperlipidemia - Will restart home medication regimen.        Consultants: cardiology Procedures performed: none  Disposition: Home Diet recommendation:  Discharge Diet Orders (From admission, onward)     Start     Ordered   06/03/23 0000  Diet - low sodium heart healthy        06/03/23 1720           Cardiac and Carb modified diet DISCHARGE MEDICATION: Allergies as of 06/03/2023       Reactions   Hydrocodone Shortness Of Breath   In combination with decongestants   Other Palpitations, Other (See Comments)   ALL DECONGESTANTS - CAUSE PALPITATIONS; THROWS HEART RHYTHM OUT OF BALANCE   Oxycodone Shortness Of Breath   Sudafed [pseudoephedrine] Palpitations   Tramadol Shortness Of Breath, Rash   flushing   Lopid [gemfibrozil] Other (See Comments)   Dizziness        Medication List     TAKE these medications    acetaminophen 500 MG tablet Commonly known as: TYLENOL Take 1,000 mg by mouth every 6 (six) hours as needed for mild pain (pain  score 1-3) or moderate pain (pain score 4-6).   apixaban 5 MG Tabs tablet Commonly known as: Eliquis Take 1 tablet (5 mg total) by mouth 2 (two) times daily.   CALCIUM 1200 PO Take 1 tablet by mouth at bedtime.   cetirizine 10 MG tablet Commonly known as: ZYRTEC Take 10 mg by mouth in the morning.   Dorzolamide HCl-Timolol Mal PF 2-0.5 % Soln Place 1 drop into both eyes at bedtime.    empagliflozin 25 MG Tabs tablet Commonly known as: JARDIANCE Take 12.5 mg by mouth daily.   ferrous sulfate 325 (65 FE) MG tablet Take 325 mg by mouth every Monday, Wednesday, and Friday.   furosemide 20 MG tablet Commonly known as: LASIX Take 20 mg by mouth 2 (two) times a week. Monday and Thursday   galantamine 24 MG 24 hr capsule Commonly known as: RAZADYNE ER Take 24 mg by mouth in the morning.   Hadlima 40 MG/0.8ML Sosy Generic drug: Adalimumab-bwwd Inject 40 mg into the skin every 14 (fourteen) days.   hydrOXYzine 10 MG tablet Commonly known as: ATARAX Take 10 mg by mouth at bedtime as needed for anxiety or itching.   ibuprofen 200 MG tablet Commonly known as: ADVIL Take 200 mg by mouth every 6 (six) hours as needed for moderate pain (pain score 4-6).   memantine 10 MG tablet Commonly known as: NAMENDA Take 20 mg by mouth at bedtime.   metFORMIN 500 MG 24 hr tablet Commonly known as: GLUCOPHAGE-XR Take 500 mg by mouth at bedtime.   multivitamin with minerals tablet Take 1 tablet by mouth daily.   nitroGLYCERIN 0.4 MG SL tablet Commonly known as: Nitrostat Place 1 tablet (0.4 mg total) under the tongue every 5 (five) minutes as needed.   omeprazole 20 MG tablet Commonly known as: PRILOSEC OTC Take 20 mg by mouth daily as needed (Acid reflux).   predniSONE 5 MG tablet Commonly known as: DELTASONE Take 1 tablet by mouth once daily with breakfast   ranolazine 500 MG 12 hr tablet Commonly known as: RANEXA Take 1 tablet (500 mg total) by mouth 2 (two) times daily.   rosuvastatin 40 MG tablet Commonly known as: CRESTOR Take 1 tablet (40 mg total) by mouth daily. What changed: when to take this   sertraline 100 MG tablet Commonly known as: ZOLOFT Take by mouth.   tamsulosin 0.4 MG Caps capsule Commonly known as: FLOMAX Take 1 capsule by mouth once daily What changed:  when to take this reasons to take this        Discharge Exam: Filed Weights    06/03/23 1149  Weight: 90.9 kg   GENERAL:  Alert, pleasant, no acute distress  HEENT:  EOMI CARDIOVASCULAR:  RRR, systolic murmurs appreciated RESPIRATORY: Mild right lower lobe crackles, poor air movement  GASTROINTESTINAL:  Soft, nontender, nondistended EXTREMITIES:  No LE edema bilaterally NEURO:  No new focal deficits appreciated SKIN:  No rashes noted PSYCH:  Appropriate mood and affect   Condition at discharge: good  The results of significant diagnostics from this hospitalization (including imaging, microbiology, ancillary and laboratory) are listed below for reference.   Imaging Studies: CT CORONARY MORPH W/CTA COR W/SCORE W/CA W/CM &/OR WO/CM Addendum Date: 06/03/2023 ADDENDUM REPORT: 06/03/2023 08:53 EXAM: OVER-READ INTERPRETATION  CT CHEST The following report is an over-read performed by radiologist Dr. Donetta Potts [facility] on 06/03/2023. This over-read does not include interpretation of cardiac or coronary anatomy or pathology. The coronary calcium score interpretation by  the cardiologist is attached. COMPARISON:  06/03/2023. FINDINGS: The heart is enlarged and multi-vessel coronary artery calcifications are noted. There is atherosclerotic calcification of the aorta with aneurysmal dilatation of the ascending aorta measuring 4.0 cm. The pulmonary trunk is normal in caliber. No mediastinal, hilar, or axillary lymphadenopathy is seen. The trachea and esophagus are within normal limits. There is a small hiatal hernia. Stable fibrotic changes are noted in the lungs with ground-glass attenuation and subpleural fibrosis. No effusion or pneumothorax is seen. Emphysematous changes are noted in the lungs. No significant pulmonary nodule or mass is seen. No acute abnormality in the upper abdomen. Degenerative changes are noted in the thoracic spine. No acute osseous abnormality is seen. IMPRESSION: 1. Aortic atherosclerosis with aneurysmal dilatation of the ascending aorta  measuring 4.0 cm. 2. Coronary artery calcifications. 3. Emphysema and stable interstitial lung disease. Electronically Signed   By: Thornell Sartorius M.D.   On: 06/03/2023 08:53   Result Date: 06/03/2023 CLINICAL DATA:  Aortic valve replacement (TAVR), pre-op eval EXAM: Cardiac TAVR CT TECHNIQUE: The patient was scanned on a Siemens Force 192 slice scanner. A 120 kV retrospective scan was triggered in the descending thoracic aorta at 111 HU's. Gantry rotation speed was 270 msecs and collimation was .9 mm. The 3D data set was reconstructed in 5% intervals of the R-R cycle. Systolic and diastolic phases were analyzed on a dedicated work station using MPR, MIP and VRT modes. The patient received OMNIPAQUE IOHEXOL 350 MG/ML SOLN of contrast. FINDINGS: Aortic Valve: Tricuspid aortic valve with severely reduced cusp excursion. Severely thickened and severely calcified aortic valve cusps. AV calcium score: 3744 Virtual Basal Annulus Measurements: Maximum/Minimum Diameter: 31.5 x 24.7 mm Perimeter: 86.8 mm Area:  574 mm2 No significant LVOT calcifications. Membranous septal length: 9 mm Based on these measurements, the annulus would be suitable for a 29 mm Sapien 3 valve. Alternatively, Heart Team can consider 34 mm Evolut valve. Recommend Heart Team discussion for valve selection. Sinus of Valsalva Measurements: Non-coronary:  39 mm Right - coronary:  39 mm Left - coronary:  41 mm Sinus of Valsalva Height: Left: 25 mm Right: 24 mm Aorta: Conventional 3 vessel branch pattern of aortic arch. Sinotubular Junction:  35 mm Ascending Thoracic Aorta:  40 mm Aortic Arch:  33 mm Descending Thoracic Aorta:  29 mm Coronary Artery Height above Annulus: Left main: 17 mm Right coronary: 16 mm Coronary Arteries: Normal coronary origin. Right dominance. The study was performed without use of NTG and insufficient for plaque evaluation. Prior PCI. Optimum Fluoroscopic Angle for Delivery: LAO 12 CAU 10 OTHER: Atria: Grossly normal. Left  atrial appendage: No thrombus. Mitral valve: Grossly normal, mild mitral annular calcifications. Pulmonary artery: Mildly dilated, 34 mm Pulmonary veins: Normal anatomy. IMPRESSION: 1. Tricuspid aortic valve with severely reduced cusp excursion. Severely thickened and severely calcified aortic valve cusps. 2. Aortic valve calcium score: 3744 3. Annulus area: 574 mm2, suitable for 29 mm Sapien 3 valve. No LVOT calcifications. Membranous septal length 9. 4. Sufficient coronary artery heights from annulus. 5. Optimum fluoroscopic angle for delivery: LAO 12 CAU 10 Electronically Signed: By: Weston Brass M.D. On: 06/03/2023 08:42   CT ANGIO ABDOMEN PELVIS  W &/OR WO CONTRAST Result Date: 06/03/2023 CLINICAL DATA:  Aortic valve replacement, TAVR, preoperative evaluation. EXAM: CT ANGIOGRAPHY CHEST, ABDOMEN AND PELVIS TECHNIQUE: Non-contrast CT of the chest was initially obtained. Multidetector CT imaging through the chest, abdomen and pelvis was performed using the standard protocol during bolus administration of  intravenous contrast. Multiplanar reconstructed images and MIPs were obtained and reviewed to evaluate the vascular anatomy. RADIATION DOSE REDUCTION: This exam was performed according to the departmental dose-optimization program which includes automated exposure control, adjustment of the mA and/or kV according to patient size and/or use of iterative reconstruction technique. CONTRAST:  OMNIPAQUE IOHEXOL 350 MG/ML SOLN COMPARISON:  03/27/2021, 09/07/2019. FINDINGS: CTA CHEST FINDINGS Cardiovascular: The heart is borderline enlarged and there is a trace pericardial effusion. Multi-vessel coronary artery calcifications are noted. There is calcification of the aortic valve. There is atherosclerotic calcification of the aorta with aneurysmal dilatation of the ascending aorta measuring 4.0 cm. The pulmonary trunk is normal in caliber. No dissection is seen. Mediastinum/Nodes: No mediastinal, hilar, or  axillary lymphadenopathy. The thyroid gland, trachea, and esophagus are within normal limits. Lungs/Pleura: Stable fibrotic changes and ground-glass attenuation is present in the lungs bilaterally. No consolidation, effusion, or pneumothorax is seen. Mild apical pleural and parenchymal thickening is present on the right. Musculoskeletal: Degenerative changes are present in the thoracic spine. No acute osseous abnormality is seen. Review of the MIP images confirms the above findings. CTA ABDOMEN AND PELVIS FINDINGS VASCULAR Aorta: Normal caliber aorta without dissection, vasculitis or significant stenosis. Aortic atherosclerosis. The abdominal aorta measures up to 2.5 cm in diameter. Celiac: Patent without evidence of aneurysm, dissection, vasculitis or significant stenosis. SMA: Patent without evidence of aneurysm, dissection, vasculitis or significant stenosis. Renals: Both renal arteries are patent without evidence of aneurysm, dissection, vasculitis, fibromuscular dysplasia or significant stenosis. IMA: Patent without evidence of aneurysm, dissection, vasculitis or significant stenosis. Inflow: Patent without evidence of aneurysm, dissection, vasculitis or significant stenosis. Veins: No obvious venous abnormality within the limitations of this arterial phase study. Review of the MIP images confirms the above findings. NON-VASCULAR Hepatobiliary: No focal liver abnormality is seen. No gallstones, gallbladder wall thickening, or biliary dilatation. Pancreas: Unremarkable. No pancreatic ductal dilatation or surrounding inflammatory changes. Spleen: Normal size. A small hypodensity is present in the anterior aspect of the spleen, likely cyst or hemangioma. Adrenals/Urinary Tract: No adrenal nodule or mass. The kidneys enhance symmetrically. Nonobstructive renal calculi are present bilaterally. There is no hydronephrosis. The bladder is unremarkable. Stomach/Bowel: Small hiatal hernia is noted. Stomach is within  normal limits. Appendix is surgically absent. No evidence of bowel wall thickening, distention, or inflammatory changes. No free air or pneumatosis is seen. Scattered diverticula are present along the colon without evidence of diverticulitis. Lymphatic: No abdominal or pelvic lymphadenopathy. Reproductive: Prostate is unremarkable. Other: No abdominopelvic ascites. A small fat containing umbilical hernia is present. Musculoskeletal: Degenerative changes are present in the lumbar spine. Bilateral pars defects are noted at L5 with mild anterolisthesis at L5-S1. No acute osseous abnormality is seen. Review of the MIP images confirms the above findings. IMPRESSION: 1. Aortic atherosclerosis with mild aneurysmal dilatation of the ascending aorta measuring 4.0 cm. Recommend annual imaging followup by CTA or MRA. This recommendation follows 2010 ACCF/AHA/AATS/ACR/ASA/SCA/SCAI/SIR/STS/SVM Guidelines for the Diagnosis and Management of Patients with Thoracic Aortic Disease. Circulation. 2010; 121: Z610-R604. Aortic aneurysm NOS (ICD10-I71.9) 2. Coronary artery calcifications. 3. Stable fibrotic and interstitial changes in the lungs bilaterally. No acute infiltrate is seen. 4. Small hiatal hernia. 5. Bilateral nephrolithiasis. 6. Diverticulosis without diverticulitis. Electronically Signed   By: Thornell Sartorius M.D.   On: 06/03/2023 07:48   CT ANGIO CHEST AORTA W/CM & OR WO/CM Result Date: 06/03/2023 CLINICAL DATA:  Aortic valve replacement, TAVR, preoperative evaluation. EXAM: CT ANGIOGRAPHY CHEST, ABDOMEN AND PELVIS TECHNIQUE: Non-contrast CT  of the chest was initially obtained. Multidetector CT imaging through the chest, abdomen and pelvis was performed using the standard protocol during bolus administration of intravenous contrast. Multiplanar reconstructed images and MIPs were obtained and reviewed to evaluate the vascular anatomy. RADIATION DOSE REDUCTION: This exam was performed according to the departmental  dose-optimization program which includes automated exposure control, adjustment of the mA and/or kV according to patient size and/or use of iterative reconstruction technique. CONTRAST:  OMNIPAQUE IOHEXOL 350 MG/ML SOLN COMPARISON:  03/27/2021, 09/07/2019. FINDINGS: CTA CHEST FINDINGS Cardiovascular: The heart is borderline enlarged and there is a trace pericardial effusion. Multi-vessel coronary artery calcifications are noted. There is calcification of the aortic valve. There is atherosclerotic calcification of the aorta with aneurysmal dilatation of the ascending aorta measuring 4.0 cm. The pulmonary trunk is normal in caliber. No dissection is seen. Mediastinum/Nodes: No mediastinal, hilar, or axillary lymphadenopathy. The thyroid gland, trachea, and esophagus are within normal limits. Lungs/Pleura: Stable fibrotic changes and ground-glass attenuation is present in the lungs bilaterally. No consolidation, effusion, or pneumothorax is seen. Mild apical pleural and parenchymal thickening is present on the right. Musculoskeletal: Degenerative changes are present in the thoracic spine. No acute osseous abnormality is seen. Review of the MIP images confirms the above findings. CTA ABDOMEN AND PELVIS FINDINGS VASCULAR Aorta: Normal caliber aorta without dissection, vasculitis or significant stenosis. Aortic atherosclerosis. The abdominal aorta measures up to 2.5 cm in diameter. Celiac: Patent without evidence of aneurysm, dissection, vasculitis or significant stenosis. SMA: Patent without evidence of aneurysm, dissection, vasculitis or significant stenosis. Renals: Both renal arteries are patent without evidence of aneurysm, dissection, vasculitis, fibromuscular dysplasia or significant stenosis. IMA: Patent without evidence of aneurysm, dissection, vasculitis or significant stenosis. Inflow: Patent without evidence of aneurysm, dissection, vasculitis or significant stenosis. Veins: No obvious venous abnormality  within the limitations of this arterial phase study. Review of the MIP images confirms the above findings. NON-VASCULAR Hepatobiliary: No focal liver abnormality is seen. No gallstones, gallbladder wall thickening, or biliary dilatation. Pancreas: Unremarkable. No pancreatic ductal dilatation or surrounding inflammatory changes. Spleen: Normal size. A small hypodensity is present in the anterior aspect of the spleen, likely cyst or hemangioma. Adrenals/Urinary Tract: No adrenal nodule or mass. The kidneys enhance symmetrically. Nonobstructive renal calculi are present bilaterally. There is no hydronephrosis. The bladder is unremarkable. Stomach/Bowel: Small hiatal hernia is noted. Stomach is within normal limits. Appendix is surgically absent. No evidence of bowel wall thickening, distention, or inflammatory changes. No free air or pneumatosis is seen. Scattered diverticula are present along the colon without evidence of diverticulitis. Lymphatic: No abdominal or pelvic lymphadenopathy. Reproductive: Prostate is unremarkable. Other: No abdominopelvic ascites. A small fat containing umbilical hernia is present. Musculoskeletal: Degenerative changes are present in the lumbar spine. Bilateral pars defects are noted at L5 with mild anterolisthesis at L5-S1. No acute osseous abnormality is seen. Review of the MIP images confirms the above findings. IMPRESSION: 1. Aortic atherosclerosis with mild aneurysmal dilatation of the ascending aorta measuring 4.0 cm. Recommend annual imaging followup by CTA or MRA. This recommendation follows 2010 ACCF/AHA/AATS/ACR/ASA/SCA/SCAI/SIR/STS/SVM Guidelines for the Diagnosis and Management of Patients with Thoracic Aortic Disease. Circulation. 2010; 121: W960-A540. Aortic aneurysm NOS (ICD10-I71.9) 2. Coronary artery calcifications. 3. Stable fibrotic and interstitial changes in the lungs bilaterally. No acute infiltrate is seen. 4. Small hiatal hernia. 5. Bilateral nephrolithiasis. 6.  Diverticulosis without diverticulitis. Electronically Signed   By: Thornell Sartorius M.D.   On: 06/03/2023 07:48   DG Chest 2 View  Result Date: 06/02/2023 CLINICAL DATA:  Chest pain EXAM: CHEST - 2 VIEW COMPARISON:  03/29/2023 FINDINGS: Frontal and lateral views of the chest demonstrate an unremarkable cardiac silhouette. No airspace disease, effusion, or pneumothorax. Stable scarring. No acute bony abnormalities. IMPRESSION: 1. No acute intrathoracic process. Electronically Signed   By: Sharlet Salina M.D.   On: 06/02/2023 21:02   CARDIAC CATHETERIZATION Result Date: 05/27/2023 Images from the original result were not included.   Previously placed Prox LAD to Mid LAD stent of unknown type is  widely patent the second overlapping mid LAD stent has 50% in-stent restenosis (at the previous PTCA site) with 55% stenosed side branch in 2nd Diag.   Dist Cx lesion is 45% stenosed.   Previously placed Mid RCA stent of unknown type is  widely patent.   RPDA lesion is 50% stenosed.   Normal Right Heart Cath Numbers/Pressures Severe Aortic Stenosis by Echocardiogram (able to cross with wire but not with catheter due to tortuosity in the innominate artery. Widely patent RCA stents with left 50% ISR of the LAD stent at the previous PTCA site-Diag bifurcation with 55% ostial Diag stenosis (does not appear to be flow-limiting) here Relatively Normal Right Heart Cath Pressures and Measurements: -RAP 5 mmHg, RV P-EDP 27/2-6 mmHg; PAP-mean 28/14-22 mmHg, PCWP 10 million mercury. -AO P-MAP 119/65-91 mmHg. -Ao sat 99%, PA sat 70%; Fick Cardiac Output 5.61-2.55. RECOMMENDATIONS Discharge home today after bedrest.  Has plan to follow-up with Dr. Excell Seltzer [facility] for TAVR evaluation Reassess symptoms after TAVR, if still symptomatic, could consider ischemic evaluation of the LAD with a Myoview stress test. Restart Eliquis p.m. 05/27/2023; restart Jardiance 05/28/2023; hold metformin 48 hours post cath Bryan Lemma,  MD  ECHOCARDIOGRAM COMPLETE Result Date: 05/14/2023    ECHOCARDIOGRAM REPORT   Patient Name:   CRIXUS MCAULAY Date of Exam: 05/14/2023 Medical Rec #:  [id number]       Height:       75.0 in Accession #:    [id number]      Weight:       206.8 lb Date of Birth:  October 20, 1945       BSA:          2.226 m Patient Age:    77 years        BP:           124/80 mmHg Patient Gender: M               HR:           70 bpm. Exam Location:  [address] Procedure: 2D Echo, Cardiac Doppler and Color Doppler Indications:    I35.0 Aortic Stenosis  History:        Patient has prior history of Echocardiogram examinations, most                 recent 04/12/2021. CAD; Risk Factors:Hypertension, Dyslipidemia,                 Diabetes and Sleep Apnea.  Sonographer:    Sedonia Small Rodgers-Jones RDCS Referring Phys: [id number] RAKESH V ALVA IMPRESSIONS  1. Left ventricular ejection fraction, by estimation, is 60 to 65%. The left ventricle has normal function. The left ventricle has no regional wall motion abnormalities. There is mild concentric left ventricular hypertrophy. Left ventricular diastolic parameters are consistent with Grade II diastolic dysfunction (pseudonormalization).  2. Right ventricular systolic function is normal. The right ventricular size is normal. There is normal pulmonary artery  systolic pressure. The estimated right ventricular systolic pressure is 34.6 mmHg.  3. Left atrial size was moderately dilated.  4. The mitral valve is degenerative. Mild mitral valve regurgitation. No evidence of mitral stenosis. Moderate mitral annular calcification.  5. The tricuspid valve is abnormal. Tricuspid valve regurgitation is moderate.  6. The aortic valve is tricuspid. There is severe calcifcation of the aortic valve. Aortic valve regurgitation is mild. Severe aortic valve stenosis. Aortic valve area, by VTI measures 0.88 cm. Aortic valve mean gradient measures 42.0 mmHg.  7. Aortic dilatation noted. There is mild dilatation of the  ascending aorta, measuring 41 mm.  8. The inferior vena cava is normal in size with greater than 50% respiratory variability, suggesting right atrial pressure of 3 mmHg. FINDINGS  Left Ventricle: Left ventricular ejection fraction, by estimation, is 60 to 65%. The left ventricle has normal function. The left ventricle has no regional wall motion abnormalities. The left ventricular internal cavity size was normal in size. There is  mild concentric left ventricular hypertrophy. Left ventricular diastolic parameters are consistent with Grade II diastolic dysfunction (pseudonormalization). Right Ventricle: The right ventricular size is normal. No increase in right ventricular wall thickness. Right ventricular systolic function is normal. There is normal pulmonary artery systolic pressure. The tricuspid regurgitant velocity is 2.81 m/s, and  with an assumed right atrial pressure of 3 mmHg, the estimated right ventricular systolic pressure is 34.6 mmHg. Left Atrium: Left atrial size was moderately dilated. Right Atrium: Right atrial size was normal in size. Pericardium: There is no evidence of pericardial effusion. Mitral Valve: The mitral valve is degenerative in appearance. Moderate mitral annular calcification. Mild mitral valve regurgitation. No evidence of mitral valve stenosis. Tricuspid Valve: The tricuspid valve is abnormal. Tricuspid valve regurgitation is moderate. The aortic valve is tricuspid. There is severe calcifcation of the aortic valve. Aortic valve regurgitation is mild. Severe aortic stenosis is present. . Pulmonic Valve: The pulmonic valve was normal in structure. Pulmonic valve regurgitation is not visualized. Aorta: The aortic root is normal in size and structure and aortic dilatation noted. There is mild dilatation of the ascending aorta, measuring 41 mm. Venous: The inferior vena cava is normal in size with greater than 50% respiratory variability, suggesting right atrial pressure of 3 mmHg.  IAS/Shunts: No atrial level shunt detected by color flow Doppler.  LEFT VENTRICLE PLAX 2D LVIDd:         5.00 cm LVIDs:         3.00 cm LV PW:         1.30 cm LV IVS:        1.30 cm LVOT diam:     2.10 cm LV SV:         69 LV SV Index:   31 LVOT Area:     3.46 cm  RIGHT VENTRICLE             IVC RV Basal diam:  4.00 cm     IVC diam: 1.60 cm RV S prime:     11.33 cm/s TAPSE (M-mode): 1.7 cm LEFT ATRIUM              Index        RIGHT ATRIUM           Index LA diam:        5.30 cm  2.38 cm/m   RA Area:     17.50 cm LA Vol (A2C):   104.0 ml 46.72 ml/m  RA Volume:  50.40 ml  22.64 ml/m LA Vol (A4C):   77.1 ml  34.64 ml/m LA Biplane Vol: 91.8 ml  41.24 ml/m  AORTIC VALVE AV Area (Vmax):    0.81 cm AV Area (Vmean):   0.78 cm AV Area (VTI):     0.88 cm AV Vmax:           374.20 cm/s AV Vmean:          270.000 cm/s AV VTI:            0.777 m AV Peak Grad:      56.0 mmHg AV Mean Grad:      42.0 mmHg LVOT Vmax:         87.40 cm/s LVOT Vmean:        60.800 cm/s LVOT VTI:          0.198 m LVOT/AV VTI ratio: 0.25 AI PHT:            665 msec  AORTA Ao Root diam: 3.60 cm Ao Asc diam:  4.10 cm MITRAL VALVE                TRICUSPID VALVE MV Area (PHT): 3.61 cm     TR Peak grad:   31.6 mmHg MV Decel Time: 210 msec     TR Vmax:        281.00 cm/s MV E velocity: 142.33 cm/s MV A velocity: 74.27 cm/s   SHUNTS MV E/A ratio:  1.92         Systemic VTI:  0.20 m                             Systemic Diam: 2.10 cm Dalton McleanMD Electronically signed by Wilfred Lacy Signature Date/Time: 05/14/2023/1:30:56 PM    Final     Microbiology: Results for orders placed or performed during the hospital encounter of 06/02/23  Resp panel by RT-PCR (RSV, Flu A&B, Covid) Anterior Nasal Swab     Status: None   Collection Time: 06/03/23  2:34 AM   Specimen: Anterior Nasal Swab  Result Value Ref Range Status   SARS Coronavirus 2 by RT PCR NEGATIVE NEGATIVE Final   Influenza A by PCR NEGATIVE NEGATIVE Final   Influenza B by PCR  NEGATIVE NEGATIVE Final    Comment: (NOTE) The Xpert Xpress SARS-CoV-2/FLU/RSV plus assay is intended as an aid in the diagnosis of influenza from Nasopharyngeal swab specimens and should not be used as a sole basis for treatment. Nasal washings and aspirates are unacceptable for Xpert Xpress SARS-CoV-2/FLU/RSV testing.  Fact Sheet for Patients: BloggerCourse.com  Fact Sheet for Healthcare Providers: SeriousBroker.it  This test is not yet approved or cleared by the [place] FDA and has been authorized for detection and/or diagnosis of SARS-CoV-2 by FDA under an Emergency Use Authorization (EUA). This EUA will remain in effect (meaning this test can be used) for the duration of the COVID-19 declaration under Section 564(b)(1) of the Act, 21 U.S.C. section 360bbb-3(b)(1), unless the authorization is terminated or revoked.     Resp Syncytial Virus by PCR NEGATIVE NEGATIVE Final    Comment: (NOTE) Fact Sheet for Patients: BloggerCourse.com  Fact Sheet for Healthcare Providers: SeriousBroker.it  This test is not yet approved or cleared by the [place] FDA and has been authorized for detection and/or diagnosis of SARS-CoV-2 by FDA under an Emergency Use Authorization (EUA). This EUA will remain in effect (meaning this test can be used) for  the duration of the COVID-19 declaration under Section 564(b)(1) of the Act, 21 U.S.C. section 360bbb-3(b)(1), unless the authorization is terminated or revoked.  Performed at [facility], [address] [place], [place] [address]     Labs: CBC: Recent Labs  Lab 06/02/23 1815 06/03/23 0438  WBC 8.2 7.2  HGB 13.2 11.9*  HCT 40.4 35.5*  MCV 88.6 87.9  PLT 187 160   Basic Metabolic Panel: Recent Labs  Lab 06/02/23 1815 06/03/23 0438  NA 137 138  K 4.2 3.7  CL 105 108  CO2 21* 24  GLUCOSE 142* 116*  BUN 15 15   CREATININE 1.17 1.01  CALCIUM 9.4 8.5*  MG  --  2.3   Liver Function Tests: No results for input(s): "AST", "ALT", "ALKPHOS", "BILITOT", "PROT", "ALBUMIN" in the last 168 hours. CBG: Recent Labs  Lab 06/03/23 0056 06/03/23 0722 06/03/23 1151 06/03/23 1604  GLUCAP 135* 92 85 150*    Discharge time spent: less than 30 minutes.  Signed: Deanna Artis, DO [facility] 06/03/2023

## 2023-06-03 NOTE — H&P (Signed)
 History and Physical    Kenneth [name] WUJ:[id number] DOB: [birth year] DOA: 06/02/2023  PCP: Jeanine Luz   Patient coming from: Home   Chief Complaint: SOB, chest pain   HPI: Kenneth [name] is a 78 y.o. male with medical history significant for type 2 diabetes mellitus, CAD, chronic HFpEF, atrial fibrillation on Eliquis, ILD, and severe aortic stenosis who presents for evaluation of chest pain and shortness of breath.  Patient has been more short of breath than usual today and has had difficulty tolerating his usual activities.  He has had pain beneath the left breast associated with this.  This pain has been persistent, localized, and without any alleviating or exacerbating factors identified.  He denies productive cough, fevers, or leg swelling.  He feels lightheaded with exertion but denies LOC.  ED Course: Upon arrival to the ED, patient is found to be afebrile and saturating 100% on room air with intermittent tachypnea, normal heart rate, and stable blood pressure.  Chest x-ray is negative for acute findings.  Labs are most notable for normal troponin x 2, normal BNP, normal WBC, and normal renal function.  Cardiology was consulted by the ED physician and recommended medical admission.  Review of Systems:  All other systems reviewed and apart from HPI, are negative.  Past Medical History:  Diagnosis Date   Arthritis    CAD S/P percutaneous coronary angioplasty 06/28/2016   Cardiologist- Dr. Herbie Baltimore: 06/28/16 -- [facility] PCI (Synergy DES 2.75 x 20 mm); 07/10/16: Staged p-m LAD PCI (FFR 0.75) - DES PCI (Synergy DES 3.5 x 20 mm); 10/11/2020: mid-distal LAD 70% (FFR 0.75) DES PCI (Synergy DES 2.5 mm x 32 mm - overlaps prox-mid stent, tapered post-dilation 3.7 - 2.6 mm.;  06/2021 - Cath with severe LAD-D2 bifurcation ISR -- PTCA / kissing balloon - reduced LAD to ~25% & D2 to 40%   Chronic dryness of both eyes    Chronic heart failure with preserved ejection fraction (HFpEF) (HCC)  05/22/2013   Diverticulosis of colon    Epiretinal membrane    "epiretinal attachment" (06/28/2016) 10-14-2017 per pt oringinally in both , now only unilateral    GERD (gastroesophageal reflux disease)    Hiatal hernia    History of adenomatous polyp of colon    History of concussion 08/23/2017   w/o loc--- per pt no residual   History of kidney stones    History of rheumatic fever 1958   NO Evidence of Vavlular disease -> only mild Aortic Sclerosis   History of skin cancer    excision leg;  froze the left arm--- unsure BCC or SCC    Hyperlipidemia    Hypertension    ILD (interstitial lung disease) (HCC)    pulmologist-  dr Vassie Loll--  secondary to methotrexate use --- last PFTs 04-25-2015  mild restriction   Iron deficiency anemia    Mild obstructive sleep apnea    10-04-2019 per pt  had a sleep study years ago , was told no cpap recommended pt denies    Nephrolithiasis    CT 09-07-2019 bilateral nonobstructive stones   Paroxysmal atrial fibrillation (HCC) 2015   a. 11/2003 Tikosyn initiated - subsequently d/c'd;  b. 2006 RFCA for Afib @ [facility];  c. 09/2011 Echo: EF 60-65%, Gr 2 DD;  d. DCCV 10/2011 , 04/2012, 12/ 2014, & 02/ 2015;  e. RFCA 01-13-2014   Plaque psoriasis 1969   follow[facility] rheumatologist  and [facility] [place]   PONV (postoperative nausea and  vomiting)    Right ureteral stone    S/P drug eluting coronary stent placement    06-28-2016  x1 to mRCA;  07-10-2016 x1 to mLAD   Scarring of lung    Seasonal allergies    Short-term memory loss    Type 2 diabetes mellitus (HCC)    followed by pcp   Ureteral calculus, right    Wears hearing aid in both ears     Past Surgical History:  Procedure Laterality Date   APPENDECTOMY  1996   ATRIAL FIBRILLATION ABLATION N/A 01/13/2014   repeat PVI, also left atrial ablation performed with successfull ablation of LA flutter by Dr Johney Frame   ATRIAL FIBRILLATION ABLATION  08/2004   Dr Delena Serve at [facility])    CARDIAC STRESS PET  03/12/2022   Normal perfusion study/low risk.  Normal LVEF and normal LVEF preserved.  Suspect reduced myocardial blood flow reserve related to microvascular disease.  No evidence of infarction or ischemia.  Resting EF 48%.  Stress EF 54%.  Mildly enlarged LV. Asc Ao 4.1 cm--stable.  Also stable mosaic attenuation in the lungs no change.   CARDIOVERSION N/A 05/22/2013   Procedure: CARDIOVERSION;  Surgeon: Hillis Range, MD;  Location: [facility] OR;  Service: Cardiovascular;  Laterality: N/A;   CARDIOVERSION N/A 10/23/2011   Procedure: CARDIOVERSION;  Surgeon: Duke Salvia, MD;  Location: [facility] CATH LAB;  Service: Cardiovascular;  Laterality: N/A;   CARDIOVERSION N/A 05/01/2012   Procedure: CARDIOVERSION;  Surgeon: Duke Salvia, MD;  Location: [facility] CATH LAB;  Service: Cardiovascular;  Laterality: N/A;   CARPAL TUNNEL RELEASE Right 1980s   CATARACT EXTRACTION W/ INTRAOCULAR LENS  IMPLANT, BILATERAL  2012  approx.   CORONARY BALLOON ANGIOPLASTY N/A 07/04/2021   Procedure: CORONARY BALLOON ANGIOPLASTY;  Surgeon: Marykay Lex, MD;  Location: [facility] INVASIVE CV LAB;  Service: Cardiovascular;; scoring balloon and an [place] balloon PTCA of LAD; with kissing balloon PTCA of LAD-D2 bifurcation stented segment reducing LAD to 20 % ISR and ostial D2 to 40%.   CORONARY PRESSURE/FFR STUDY N/A 06/28/2016   Procedure: Intravascular Pressure Wire/FFR Study;  Surgeon: Marykay Lex, MD;  Location: [facility] INVASIVE CV LAB;  Service: Cardiovascular: FFR of mLAD ~65% lesion = 0.75 post --> Staged PCI   CORONARY PRESSURE/FFR STUDY N/A 10/11/2020   Procedure: INTRAVASCULAR PRESSURE WIRE/FFR STUDY;  Surgeon: Marykay Lex, MD;  Location: [facility] INVASIVE CV LAB;  Service: Cardiovascular;  Laterality: LAD mid-distal just after D2 --> FFR 0.75 SIGNIFICANT => PCI   CORONARY STENT INTERVENTION N/A 06/28/2016   Procedure: Coronary Stent Intervention;  Surgeon: Marykay Lex, MD;  Location: [facility] INVASIVE CV LAB;  Service:  Cardiovascular;  Laterality: mRCA PCI -Synergy DES 2.75 m x 20 mm (3.1 mm)   CORONARY STENT INTERVENTION N/A 07/10/2016   Procedure: Coronary Stent Intervention;  Surgeon: Marykay Lex, MD;  Location: [facility] INVASIVE CV LAB;  Service: Cardiovascular: mLAD PCI Synergy DES 3.5 mm x 20 mm)   CORONARY STENT INTERVENTION N/A 10/11/2020   Procedure: CORONARY STENT INTERVENTION;  Surgeon: Marykay Lex, MD;  Location: [facility] INVASIVE CV LAB;  Service: Cardiovascular: mid-distal LAD ~70% (FFR 0.75): SYNERGY DES 2.5 MM X 32 MM (overlaps prox-mid Stent from 07/2016) - Tapered post-dilation 3.7 mm ->2.6 mm; Jailed D2 PTCA reduced 60% to 40%.   CYSTOSCOPY WITH RETROGRADE PYELOGRAM, URETEROSCOPY AND STENT PLACEMENT Right 10/16/2017   Procedure: CYSTOSCOPY WITH RIGHT RETROGRADE RIGHT URETEROSCOPY AND STONE BASKET EXTRACTION;  Surgeon: Bjorn Pippin, MD;  Location: [facility];  Service: Urology;  Laterality: Right;   CYSTOSCOPY WITH RETROGRADE PYELOGRAM, URETEROSCOPY AND STENT PLACEMENT Right 10/08/2019   Procedure: CYSTOSCOPY WITH URETEROSCOPY; RETROGRADE PYELOGRAM; STONE BASKET EXTRACTION;  STENT PLACEMENT;  Surgeon: Malen Gauze, MD;  Location: [facility];  Service: Urology;  Laterality: Right;  1 HR   CYSTOSCOPY WITH RETROGRADE PYELOGRAM, URETEROSCOPY AND STENT PLACEMENT Right 03/28/2020   Procedure: CYSTOSCOPY WITH RIGHT RETROGRADE PYELOGRAM, URETEROSCOPY WITH HOLMIUM LASER AND STENT PLACEMENT;  Surgeon: Bjorn Pippin, MD;  Location: [facility];  Service: Urology;  Laterality: Right;   CYSTOSCOPY WITH RETROGRADE PYELOGRAM, URETEROSCOPY AND STENT PLACEMENT Left 10/22/2021   Procedure: CYSTOSCOPY WITH LEFT RETROGRADE URETEROSCOPY WIHT HOLMIUM LASERAND STENT PLACEMENT;  Surgeon: Bjorn Pippin, MD;  Location: [facility];  Service: Urology;  Laterality: Left;   CYSTOSCOPY/RETROGRADE/URETEROSCOPY/STONE EXTRACTION WITH BASKET Right 10/27/2017   Procedure: CYSTOSCOPY/RETROGRADE/URETEROSCOPY/STONE  EXTRACTION WITH BASKET/URETERAL STENT PLACEMENT and laser;  Surgeon: Bjorn Pippin, MD;  Location: [facility];  Service: Urology;  Laterality: Right;   CYSTOSCOPY/URETEROSCOPY/HOLMIUM LASER/STENT PLACEMENT Right 01/03/2023   Procedure: CYSTOSCOPY RIGHT RETROGRADE URETEROSCOPY/HOLMIUM LASER/STENT PLACEMENT;  Surgeon: Bjorn Pippin, MD;  Location: [facility];  Service: Urology;  Laterality: Right;   EXTRACORPOREAL SHOCK WAVE LITHOTRIPSY Left 08/10/2020   Procedure: EXTRACORPOREAL SHOCK WAVE LITHOTRIPSY (ESWL);  Surgeon: Crist Fat, MD;  Location: [facility];  Service: Urology;  Laterality: Left;   EXTRACORPOREAL SHOCK WAVE LITHOTRIPSY Right 12/16/2022   Procedure: RIGHT EXTRACORPOREAL SHOCK WAVE LITHOTRIPSY (ESWL);  Surgeon: Bjorn Pippin, MD;  Location: [facility];  Service: Urology;  Laterality: Right;   HOLMIUM LASER APPLICATION Right 10/27/2017   Procedure: POSSIBLE HOLMIUM LASER APPLICATION;  Surgeon: Bjorn Pippin, MD;  Location: [facility];  Service: Urology;  Laterality: Right;   HOLMIUM LASER APPLICATION Right 10/08/2019   Procedure: HOLMIUM LASER APPLICATION;  Surgeon: Malen Gauze, MD;  Location: [facility];  Service: Urology;  Laterality: Right;   LEFT HEART CATH AND CORONARY ANGIOGRAPHY  06/30/2003   Dr. Riley Kill: NO Significant/Obstructive CAD; Preseved LVEF (for Recurrent Afib)   NASAL SINUS SURGERY     x 2   NM MYOVIEW LTD  02/11/2021   Lexiscan:EF 55-65%.  Normal function.  No ischemia or infarction.  No EKG changes noted.   PROXIMAL INTERPHALANGEAL FUSION (PIP) Left 01-05-2001    dr graves   correction clawtoe and extensor tendon lengthening   RIGHT/LEFT HEART CATH AND CORONARY ANGIOGRAPHY N/A 06/28/2016   Procedure: Right/Left Heart Cath and Coronary Angiography;  Surgeon: Marykay Lex, MD;  Location: [facility] INVASIVE CV LAB;  Service: Cardiovascular: mRCA 99% (TIMI 2), mLAD ~65% (FFR 0.75), OM3 55%. mild Pulm HTN. Mod LVEDP elevation. EF  55-65%.   RIGHT/LEFT HEART CATH AND CORONARY ANGIOGRAPHY N/A 10/11/2020   Procedure: RIGHT/LEFT HEART CATH AND CORONARY ANGIOGRAPHY;  Surgeon: Marykay Lex, MD;  Location: [facility] INVASIVE CV LAB;; Patent DES in mRCA & p-mLAD. CULPRIT = m-d LAD 70% (just after D1 w/ Ost 60%) FFR 0.75 => DES PCI m-dLAD & PTCA ost D1 (overlapping prior stent distally & crossing D2 w/  Ost D2 post PTCA). RIGHT HEART CATH PRESSURES,  & CO-CI ARE NORMAL. NO PULM HTN   RIGHT/LEFT HEART CATH AND CORONARY ANGIOGRAPHY N/A 07/04/2021   Procedure: RIGHT/LEFT HEART CATH AND CORONARY ANGIOGRAPHY;  Surgeon: Marykay Lex, MD;  Location: [facility] INVASIVE CV LAB;  Service: Cardiovascular;; moderately elevated LVEDP of 20 mmHg, and PCWP-28 mmHg -> mean PAP of 24 mmHg.  Moderate AS-mean  gradient 33 mmHg.  (Similar to Echo 26 mmHg); borderline low BP.  CO-CI 9.2 and 4.0.. Stable CAD W/ progression of ISR of LAD stent at D2 => PCI   RIGHT/LEFT HEART CATH AND CORONARY ANGIOGRAPHY N/A 05/27/2023   Procedure: RIGHT/LEFT HEART CATH AND CORONARY ANGIOGRAPHY;  Surgeon: Marykay Lex, MD;  Location: [facility] INVASIVE CV LAB;  Service: Cardiovascular;  Laterality: N/A;   ROTATOR CUFF REPAIR Bilateral last one 2014   TONSILLECTOMY  age 59   TRANSTHORACIC ECHOCARDIOGRAM  09/29/2020   EF > 75%, Hyperdynamic. No RWMA. Mild LVH. ~ DD with mild LA dilation. Mild AI. Normal RV size & fxn, ? Dilated IVC c/w CVP ~ 15 mHg.   TRANSTHORACIC ECHOCARDIOGRAM  04/12/2021   EF estimated 65%.  Normal LV size and function.  No RWMA.  Mild LVH.  GRII DD.  Elevated LVEDP (c/w HFpEF).  Normal RV size.  Mildly elevated PAP estimated 36 mmHg.  Mild MR.  Mild AS (mean gradient 26 mmHg.  DI 0.32.  Aortic root 40 mm, ascending aorta 41 mm.   URETEROSCOPIC LASER LITHOTRIPSY STONE EXTRACTIONS/  STENT PLACEMENT Bilateral 09-10-2007   dr Annabell Howells  [facility]   per pt has had several prior ureteroscopic stone extraction since age 50 , last one 09-10-2007   Zio patch monitor:  02/2021    Probably Sinus Rhythm: Heart rate 56-146 bpm, average 83 bpm.  Frequent PACs noted with rare isolated PVCs.  Multiple episodes of PAT/atrial runs-longest 23 seconds average heart rate 95 bpm, fastest 5 beats at a max rate of 231 bpm.  No evidence of A-fib or atrial flutter.  No prolonged or sustained SVT/PAT.    Social History:   reports that he quit smoking about 25 years ago. His smoking use included cigars. He has quit using smokeless tobacco.  His smokeless tobacco use included snuff and chew. He reports that he does not drink alcohol and does not use drugs.  Allergies  Allergen Reactions   Hydrocodone Shortness Of Breath    In combination with decongestants   Other Palpitations and Other (See Comments)    ALL DECONGESTANTS - CAUSE PALPITATIONS; THROWS HEART RHYTHM OUT OF BALANCE   Oxycodone Shortness Of Breath   Sudafed [Pseudoephedrine] Palpitations   Tramadol Shortness Of Breath and Rash    flushing   Lopid [Gemfibrozil] Other (See Comments)    Dizziness      Family History  Problem Relation Age of Onset   Coronary artery disease Mother    Breast cancer Mother    Colon cancer Mother    Heart disease Maternal Grandmother        MI   Stroke Paternal Grandfather        CVA   Coronary artery disease Paternal Grandfather    Esophageal cancer Neg Hx    Stomach cancer Neg Hx    Rectal cancer Neg Hx      Prior to Admission medications   Medication Sig Start Date End Date Taking? Authorizing Provider  Adalimumab-bwwd (HADLIMA) 40 MG/0.8ML SOSY Inject 40 mg into the skin every 14 (fourteen) days.    [provider]  apixaban (ELIQUIS) 5 MG TABS tablet Take 1 tablet (5 mg total) by mouth 2 (two) times daily. 01/12/21   Fenton, Clint R, PA  Calcium Carbonate-Vit D-Min (CALCIUM 1200 PO) Take 1 tablet by mouth every evening.    [provider]  cetirizine (ZYRTEC) 10 MG tablet Take 10 mg by mouth in the morning.    [provider]  Dorzolamide  HCl-Timolol Mal PF 2-0.5 % SOLN Place 1 drop into both eyes at bedtime.    [provider]  empagliflozin (JARDIANCE) 25 MG TABS tablet Take 12.5 mg by mouth daily. 12/26/21   [provider]  ferrous sulfate 325 (65 FE) MG tablet Take 325 mg by mouth every Monday, Wednesday, and Friday.    Ronney Asters, NP  furosemide (LASIX) 20 MG tablet Take 20 mg by mouth 2 (two) times a week. Monday and Thursday    [provider]  galantamine (RAZADYNE ER) 24 MG 24 hr capsule Take 24 mg by mouth in the morning. 08/30/20   [provider]  ibuprofen (ADVIL) 200 MG tablet Take 200-800 mg by mouth every 6 (six) hours as needed for moderate pain.    [provider]  memantine (NAMENDA) 10 MG tablet Take 20 mg by mouth at bedtime. 08/30/20   [provider]  metFORMIN (GLUCOPHAGE-XR) 500 MG 24 hr tablet Take 500 mg by mouth at bedtime.    [provider]  Multiple Vitamins-Minerals (MULTIVITAMIN WITH MINERALS) tablet Take 1 tablet by mouth 3 (three) times a week.    [provider]  nitroGLYCERIN (NITROSTAT) 0.4 MG SL tablet Place 1 tablet (0.4 mg total) under the tongue every 5 (five) minutes as needed. 07/04/21   Arty Baumgartner, NP  omeprazole (PRILOSEC OTC) 20 MG tablet Take 20 mg by mouth daily as needed (Acid reflux).    [provider]  predniSONE (DELTASONE) 5 MG tablet Take 1 tablet by mouth once daily with breakfast 03/07/23   Oretha Milch, MD  ranolazine (RANEXA) 500 MG 12 hr tablet Take 1 tablet (500 mg total) by mouth 2 (two) times daily. 11/11/22   Marykay Lex, MD  rosuvastatin (CRESTOR) 40 MG tablet Take 1 tablet (40 mg total) by mouth daily. 12/28/21   Marykay Lex, MD  tamsulosin (FLOMAX) 0.4 MG CAPS capsule Take 1 capsule by mouth once daily Patient taking differently: Take 0.4 mg by mouth daily as needed (kidney stones). 03/13/21   Malen Gauze, MD    Physical Exam: Vitals:   06/02/23 2138  06/02/23 2200 06/02/23 2300 06/03/23 0102  BP:  123/70 138/67   Pulse:  63 64   Resp:  (!) 29 (!) 29   Temp: (!) 97.5 F (36.4 C)   97.7 F (36.5 C)  TempSrc: Oral   Oral  SpO2:  100% 100%     Constitutional: NAD, no pallor or diaphoresis   Eyes: PERTLA, lids and conjunctivae normal ENMT: Mucous membranes are moist. Posterior pharynx clear of any exudate or lesions.   Neck: supple, no masses  Respiratory: no wheezing, no crackles. No accessory muscle use.  Cardiovascular: S1 & S2 heard, regular rate and rhythm. No extremity edema.  Abdomen: No distension, no tenderness, soft. Bowel sounds active.  Musculoskeletal: no clubbing / cyanosis. No joint deformity upper and lower extremities.   Skin: no significant rashes, lesions, ulcers. Warm, dry, well-perfused. Neurologic: CN 2-12 grossly intact. Moving all extremities. Alert and oriented.  Psychiatric: Calm. Cooperative.    Labs and Imaging on Admission: I have personally reviewed following labs and imaging studies  CBC: Recent Labs  Lab 05/27/23 1455 05/27/23 1502 05/27/23 1503 06/02/23 1815  WBC  --   --   --  8.2  HGB 12.2* 12.6* 12.6* 13.2  HCT 36.0* 37.0* 37.0* 40.4  MCV  --   --   --  88.6  PLT  --   --   --  187   Basic Metabolic Panel: Recent Labs  Lab 05/27/23 1455 05/27/23 1502 05/27/23 1503 06/02/23 1815  NA 142 141 142 137  K 3.8 3.8 3.7 4.2  CL  --   --   --  105  CO2  --   --   --  21*  GLUCOSE  --   --   --  142*  BUN  --   --   --  15  CREATININE  --   --   --  1.17  CALCIUM  --   --   --  9.4   GFR: Estimated Creatinine Clearance: 63.2 mL/min (by C-G formula based on SCr of 1.17 mg/dL). Liver Function Tests: No results for input(s): "AST", "ALT", "ALKPHOS", "BILITOT", "PROT", "ALBUMIN" in the last 168 hours. No results for input(s): "LIPASE", "AMYLASE" in the last 168 hours. No results for input(s): "AMMONIA" in the last 168 hours. Coagulation Profile: No results for input(s): "INR",  "PROTIME" in the last 168 hours. Cardiac Enzymes: No results for input(s): "CKTOTAL", "CKMB", "CKMBINDEX", "TROPONINI" in the last 168 hours. BNP (last 3 results) No results for input(s): "PROBNP" in the last 8760 hours. HbA1C: No results for input(s): "HGBA1C" in the last 72 hours. CBG: Recent Labs  Lab 05/27/23 0924 05/27/23 1609 06/03/23 0056  GLUCAP 118* 102* 135*   Lipid Profile: No results for input(s): "CHOL", "HDL", "LDLCALC", "TRIG", "CHOLHDL", "LDLDIRECT" in the last 72 hours. Thyroid Function Tests: No results for input(s): "TSH", "T4TOTAL", "FREET4", "T3FREE", "THYROIDAB" in the last 72 hours. Anemia Panel: No results for input(s): "VITAMINB12", "FOLATE", "FERRITIN", "TIBC", "IRON", "RETICCTPCT" in the last 72 hours. Urine analysis:    Component Value Date/Time   COLORURINE YELLOW 08/14/2020 1339   APPEARANCEUR HAZY (A) 08/14/2020 1339   LABSPEC 1.009 08/14/2020 1339   PHURINE 7.0 08/14/2020 1339   GLUCOSEU 50 (A) 08/14/2020 1339   HGBUR MODERATE (A) 08/14/2020 1339   BILIRUBINUR NEGATIVE 08/14/2020 1339   KETONESUR NEGATIVE 08/14/2020 1339   PROTEINUR NEGATIVE 08/14/2020 1339   UROBILINOGEN 0.2 10/22/2011 1732   NITRITE NEGATIVE 08/14/2020 1339   LEUKOCYTESUR NEGATIVE 08/14/2020 1339   Sepsis Labs: @LABRCNTIP (procalcitonin:4,lacticidven:4) )No results found for this or any previous visit (from the past 240 hours).   Radiological Exams on Admission: DG Chest 2 View Result Date: 06/02/2023 CLINICAL DATA:  Chest pain EXAM: CHEST - 2 VIEW COMPARISON:  03/29/2023 FINDINGS: Frontal and lateral views of the chest demonstrate an unremarkable cardiac silhouette. No airspace disease, effusion, or pneumothorax. Stable scarring. No acute bony abnormalities. IMPRESSION: 1. No acute intrathoracic process. Electronically Signed   By: Sharlet Salina M.D.   On: 06/02/2023 21:02    EKG: Independently reviewed. Sinus rhythm.   Assessment/Plan   1. SOB and chest pain -  Presents with chest pain and acute worsening in SOB without cough, fever, or leg swelling; he held 4 doses of Eliquis for recent heart cath but has otherwise been adherent  - He is speaking in full sentences and saturating 100% on rm air  - Troponin normal x2 and no acute findings noted on CXR in ED  - Check respiratory virus panel and D-dimer   2. Severe AS  - Undergoing pre-TAVR workup    3. CAD  - No new obstructing lesions on recent cath  - Crestor, Ranexa, Eliquis    4. PAF  - Eliquis   5. Chronic HFpEF  - Appears compensated  - Lasix, Jardiance  6. ILD  - Continue prednisone 5 mg daily, supportive care    7. Type II DM  - A1c was 6.3% in February 2025  - Hold metformin, check CBGs, and use low-intensity SSI for now   DVT prophylaxis: Eliquis  Code Status: Full  Level of Care: Level of care: Telemetry Cardiac Family Communication: None present  Disposition Plan:  Patient is from: home  Anticipated d/c is to: Home  Anticipated d/c date is: 2/26 or 06/05/23  Patient currently: Pending respiratory virus panel, TAVR scans  Consults called: Cardiology  Admission status: Observation     Briscoe Deutscher, MD [facility]  06/03/2023, 1:20 AM

## 2023-06-03 NOTE — Consult Note (Addendum)
 HEART AND VASCULAR CENTER   MULTIDISCIPLINARY HEART VALVE TEAM  Cardiology Consultation:   Patient ID: Kenneth [name] MRN: [id number]; DOB: 1946-02-07  Admit date: 06/02/2023 Date of Consult: 06/03/2023  Primary Care Provider: Clinic, [facility] Cardiologist: Bryan Lemma, MD  [facility] Electrophysiologist:  None    Patient Profile:   Kenneth [name] is a 78 y.o. male with a hx of paroxysmal atrial flutter s/p ablation 2006 & 2015, CAD s/p multiple PCIs, severe AS, interstitial lung disease on chronic low dose prednisone, HTN, HLD and DMT2 who is being seen today for the evaluation of severe aortic stenosis at the request of Dr. Jacques Navy.  History of Present Illness:   [name] lives on a farm with his wife in [place], [place].  He has one daughter who lives next door and two granddaughters who he is very close with- one is a Engineer, civil (consulting) in the neuro ICU at [employer] [employer].  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  He does still drive a car and can walk without the aid of a cane or walker.  The most activity he gets is going to the grocery store and pharmacy, which has even been difficult recently. Of note, he gets regular dental work with no active issues.  Patient had dyspnea on exertion in 2018 and underwent L/RHC that showed mild pulmonary hypertension, severe 95% mid RCA lesion treated with DES, 70% proximal to mid LAD lesion with FFR of 0.75 treated with staged PCI DES, 60% distal left circumflex lesion, 55% OM 3 lesion.  Repeat L/RHC performed in 2022 showed a 70% mid to distal LAD lesion with FFR 0.75 treated with Synergy DES and 60% ostial D1 lesion treated with PTCA. Repeat L/RHC in 2023 showed significant in-stent restenosis at LAD-D2 bifurcation site treated  with PTCA with kissing balloon technique.    He has a history of ILD followed closely by Dr. Vassie Loll. He is maintained on low dose chronic prednisone with escalations during acute exacerbations. Recently noted to have a worsening murmur by Dr. Vassie Loll and echo was ordered. Echo 05/14/2023 showed EF 60-65%, G2DD, mild MR, moderate TR, mild AI and severe aortic stenosis with a mean grad 42 mmHg, peak grad 56 mmHg, [name] 0.78 cm2, DVI 0.25, SVI 31. He underwent L/RHC on 05/27/2023 which showed 50% mid LAD in-stent restenosis, 50% stenosis in sidebranch in D2, 45% distal left circumflex lesion, 50% RPDA lesion, wedge pressure 10 mmHg, PA pressure 28/14 with mean 22 mmHg. Cardiac output 5.61, cardiac index 2.55. Patient was referred to Dr. Excell Seltzer for structural heart consultation. Dr. Herbie Baltimore recommended reassessing his symptom after TAVR and if still symptomatic, could consider ischemic evaluation of the LAD using a Myoview stress test.   Since his heart cath on 05/27/23 he has had paroxysms of shortness of breath and atypical chest pain. He presented to the [facility] ED on 06/02/23 for evaluation. He has had no evidence of heart failure or acute ischemia. ECG with normal sinus. CXR unremarkable. Respiratory panel, Ddimer, BNP and HStrop normal. 02 sats 100% on RA. Symptoms felt to be related to severe AS and structural heart consulted. Pre TAVR CTs were ordered by Dr. Jacques Navy and completed 06/03/23. Cardiac gated CTA of the heart reveals anatomical characteristics consistent with aortic stenosis suitable  for treatment by transcatheter aortic valve replacement without any significant complicating features and CTA of the aorta and iliac vessels demonstrate what appear to be adequate pelvic vascular access to facilitate a transfemoral approach. He sizes to a 29mm Edwards Sapien or 34 mm Medtronic Evolut FX.  Seen in bed with his wife and granddaughter (neuro ICU nurse) at bedside. He is currently feeling well with no complaints. They  are very anxious to get his valve replaced. He reports feeling nervous to go to bed for fear that he wont wake up in the morning. No denies recurrent CP or SOB today. When he did have chest pain, it was pinpoint, lasting minutes and not related to exertion. He reports symptoms prior to many PCIs was only shortness of breath. No LE edema or PND. He does report occasional orthopnea for which he sleeps in a recliner. He gets occasional dizziness with positional changes but has no had syncope. No blood in stool or urine. No palpitations.    Past Medical History:  Diagnosis Date   Arthritis    CAD S/P percutaneous coronary angioplasty 06/28/2016   Cardiologist- Dr. Herbie Baltimore: 06/28/16 -- [facility] PCI (Synergy DES 2.75 x 20 mm); 07/10/16: Staged p-m LAD PCI (FFR 0.75) - DES PCI (Synergy DES 3.5 x 20 mm); 10/11/2020: mid-distal LAD 70% (FFR 0.75) DES PCI (Synergy DES 2.5 mm x 32 mm - overlaps prox-mid stent, tapered post-dilation 3.7 - 2.6 mm.;  06/2021 - Cath with severe LAD-D2 bifurcation ISR -- PTCA / kissing balloon - reduced LAD to ~25% & D2 to 40%   Chronic dryness of both eyes    Chronic heart failure with preserved ejection fraction (HFpEF) (HCC) 05/22/2013   Diverticulosis of colon    Epiretinal membrane    "epiretinal attachment" (06/28/2016) 10-14-2017 per pt oringinally in both , now only unilateral    GERD (gastroesophageal reflux disease)    Hiatal hernia    History of adenomatous polyp of colon    History of concussion 08/23/2017   w/o loc--- per pt no residual   History of kidney stones    History of rheumatic fever 1958   NO Evidence of Vavlular disease -> only mild Aortic Sclerosis   History of skin cancer    excision leg;  froze the left arm--- unsure BCC or SCC    Hyperlipidemia    Hypertension    ILD (interstitial lung disease) (HCC)    pulmologist-  dr Vassie Loll--  secondary to methotrexate use --- last PFTs 04-25-2015  mild restriction   Iron deficiency anemia    Mild obstructive sleep  apnea    10-04-2019 per pt  had a sleep study years ago , was told no cpap recommended pt denies    Nephrolithiasis    CT 09-07-2019 bilateral nonobstructive stones   Paroxysmal atrial fibrillation (HCC) 2015   a. 11/2003 Tikosyn initiated - subsequently d/c'd;  b. 2006 RFCA for Afib @ [facility];  c. 09/2011 Echo: EF 60-65%, Gr 2 DD;  d. DCCV 10/2011 , 04/2012, 12/ 2014, & 02/ 2015;  e. RFCA 01-13-2014   Plaque psoriasis 1969   follow[facility] rheumat[facility]  and [facility] [place]   PONV (postoperative nausea and vomiting)    Right ureteral stone    S/P drug eluting coronary stent placement    06-28-2016  x1 to mRCA;  07-10-2016 x1 to mLAD   Scarring of lung    Seasonal allergies    Short-term memory loss    Type 2 diabetes  mellitus (HCC)    followed by pcp   Ureteral calculus, right    Wears hearing aid in both ears     Past Surgical History:  Procedure Laterality Date   APPENDECTOMY  1996   ATRIAL FIBRILLATION ABLATION N/A 01/13/2014   repeat PVI, also left atrial ablation performed with successfull ablation of LA flutter by Dr Johney Frame   ATRIAL FIBRILLATION ABLATION  08/2004   Dr Delena Serve at [facility] , [place])   CARDIAC STRESS PET  03/12/2022   Normal perfusion study/low risk.  Normal LVEF and normal LVEF preserved.  Suspect reduced myocardial blood flow reserve related to microvascular disease.  No evidence of infarction or ischemia.  Resting EF 48%.  Stress EF 54%.  Mildly enlarged LV. Asc Ao 4.1 cm--stable.  Also stable mosaic attenuation in the lungs no change.   CARDIOVERSION N/A 05/22/2013   Procedure: CARDIOVERSION;  Surgeon: Hillis Range, MD;  Location: [facility] OR;  Service: Cardiovascular;  Laterality: N/A;   CARDIOVERSION N/A 10/23/2011   Procedure: CARDIOVERSION;  Surgeon: Duke Salvia, MD;  Location: [facility] CATH LAB;  Service: Cardiovascular;  Laterality: N/A;   CARDIOVERSION N/A 05/01/2012   Procedure: CARDIOVERSION;  Surgeon: Duke Salvia, MD;  Location: [facility] CATH  LAB;  Service: Cardiovascular;  Laterality: N/A;   CARPAL TUNNEL RELEASE Right 1980s   CATARACT EXTRACTION W/ INTRAOCULAR LENS  IMPLANT, BILATERAL  2012  approx.   CORONARY BALLOON ANGIOPLASTY N/A 07/04/2021   Procedure: CORONARY BALLOON ANGIOPLASTY;  Surgeon: Marykay Lex, MD;  Location: [facility] INVASIVE CV LAB;  Service: Cardiovascular;; scoring balloon and an Orient balloon PTCA of LAD; with kissing balloon PTCA of LAD-D2 bifurcation stented segment reducing LAD to 20 % ISR and ostial D2 to 40%.   CORONARY PRESSURE/FFR STUDY N/A 06/28/2016   Procedure: Intravascular Pressure Wire/FFR Study;  Surgeon: Marykay Lex, MD;  Location: [facility] INVASIVE CV LAB;  Service: Cardiovascular: FFR of mLAD ~65% lesion = 0.75 post --> Staged PCI   CORONARY PRESSURE/FFR STUDY N/A 10/11/2020   Procedure: INTRAVASCULAR PRESSURE WIRE/FFR STUDY;  Surgeon: Marykay Lex, MD;  Location: [facility] INVASIVE CV LAB;  Service: Cardiovascular;  Laterality: LAD mid-distal just after D2 --> FFR 0.75 SIGNIFICANT => PCI   CORONARY STENT INTERVENTION N/A 06/28/2016   Procedure: Coronary Stent Intervention;  Surgeon: Marykay Lex, MD;  Location: [facility] INVASIVE CV LAB;  Service: Cardiovascular;  Laterality: mRCA PCI -Synergy DES 2.75 m x 20 mm (3.1 mm)   CORONARY STENT INTERVENTION N/A 07/10/2016   Procedure: Coronary Stent Intervention;  Surgeon: Marykay Lex, MD;  Location: [facility] INVASIVE CV LAB;  Service: Cardiovascular: mLAD PCI Synergy DES 3.5 mm x 20 mm)   CORONARY STENT INTERVENTION N/A 10/11/2020   Procedure: CORONARY STENT INTERVENTION;  Surgeon: Marykay Lex, MD;  Location: [facility] INVASIVE CV LAB;  Service: Cardiovascular: mid-distal LAD ~70% (FFR 0.75): SYNERGY DES 2.5 MM X 32 MM (overlaps prox-mid Stent from 07/2016) - Tapered post-dilation 3.7 mm ->2.6 mm; Jailed D2 PTCA reduced 60% to 40%.   CYSTOSCOPY WITH RETROGRADE PYELOGRAM, URETEROSCOPY AND STENT PLACEMENT Right 10/16/2017   Procedure: CYSTOSCOPY WITH RIGHT RETROGRADE RIGHT  URETEROSCOPY AND STONE BASKET EXTRACTION;  Surgeon: Bjorn Pippin, MD;  Location: [facility];  Service: Urology;  Laterality: Right;   CYSTOSCOPY WITH RETROGRADE PYELOGRAM, URETEROSCOPY AND STENT PLACEMENT Right 10/08/2019   Procedure: CYSTOSCOPY WITH URETEROSCOPY; RETROGRADE PYELOGRAM; STONE BASKET EXTRACTION;  STENT PLACEMENT;  Surgeon: Malen Gauze, MD;  Location: [facility];  Service: Urology;  Laterality: Right;  1 HR   CYSTOSCOPY WITH RETROGRADE PYELOGRAM, URETEROSCOPY AND STENT PLACEMENT Right 03/28/2020   Procedure: CYSTOSCOPY WITH RIGHT RETROGRADE PYELOGRAM, URETEROSCOPY WITH HOLMIUM LASER AND STENT PLACEMENT;  Surgeon: Bjorn Pippin, MD;  Location: [facility];  Service: Urology;  Laterality: Right;   CYSTOSCOPY WITH RETROGRADE PYELOGRAM, URETEROSCOPY AND STENT PLACEMENT Left 10/22/2021   Procedure: CYSTOSCOPY WITH LEFT RETROGRADE URETEROSCOPY WIHT HOLMIUM LASERAND STENT PLACEMENT;  Surgeon: Bjorn Pippin, MD;  Location: [facility];  Service: Urology;  Laterality: Left;   CYSTOSCOPY/RETROGRADE/URETEROSCOPY/STONE EXTRACTION WITH BASKET Right 10/27/2017   Procedure: CYSTOSCOPY/RETROGRADE/URETEROSCOPY/STONE EXTRACTION WITH BASKET/URETERAL STENT PLACEMENT and laser;  Surgeon: Bjorn Pippin, MD;  Location: [facility];  Service: Urology;  Laterality: Right;   CYSTOSCOPY/URETEROSCOPY/HOLMIUM LASER/STENT PLACEMENT Right 01/03/2023   Procedure: CYSTOSCOPY RIGHT RETROGRADE URETEROSCOPY/HOLMIUM LASER/STENT PLACEMENT;  Surgeon: Bjorn Pippin, MD;  Location: [facility];  Service: Urology;  Laterality: Right;   EXTRACORPOREAL SHOCK WAVE LITHOTRIPSY Left 08/10/2020   Procedure: EXTRACORPOREAL SHOCK WAVE LITHOTRIPSY (ESWL);  Surgeon: Crist Fat, MD;  Location: [facility];  Service: Urology;  Laterality: Left;   EXTRACORPOREAL SHOCK WAVE LITHOTRIPSY Right 12/16/2022   Procedure: RIGHT EXTRACORPOREAL SHOCK WAVE LITHOTRIPSY (ESWL);  Surgeon: Bjorn Pippin, MD;  Location: [facility];  Service: Urology;  Laterality: Right;   HOLMIUM LASER APPLICATION Right 10/27/2017   Procedure: POSSIBLE HOLMIUM LASER APPLICATION;  Surgeon: Bjorn Pippin, MD;  Location: [facility];  Service: Urology;  Laterality: Right;   HOLMIUM LASER APPLICATION Right 10/08/2019   Procedure: HOLMIUM LASER APPLICATION;  Surgeon: Malen Gauze, MD;  Location: [facility];  Service: Urology;  Laterality: Right;   LEFT HEART CATH AND CORONARY ANGIOGRAPHY  06/30/2003   Dr. Riley Kill: NO Significant/Obstructive CAD; Preseved LVEF (for Recurrent Afib)   NASAL SINUS SURGERY     x 2   NM MYOVIEW LTD  02/11/2021   [facility]:EF 55-65%.  Normal function.  No ischemia or infarction.  No EKG changes noted.   PROXIMAL INTERPHALANGEAL FUSION (PIP) Left 01-05-2001    dr graves   correction clawtoe and extensor tendon lengthening   RIGHT/LEFT HEART CATH AND CORONARY ANGIOGRAPHY N/A 06/28/2016   Procedure: Right/Left Heart Cath and Coronary Angiography;  Surgeon: Marykay Lex, MD;  Location: [facility] INVASIVE CV LAB;  Service: Cardiovascular: mRCA 99% (TIMI 2), mLAD ~65% (FFR 0.75), OM3 55%. mild Pulm HTN. Mod LVEDP elevation. EF 55-65%.   RIGHT/LEFT HEART CATH AND CORONARY ANGIOGRAPHY N/A 10/11/2020   Procedure: RIGHT/LEFT HEART CATH AND CORONARY ANGIOGRAPHY;  Surgeon: Marykay Lex, MD;  Location: [facility] INVASIVE CV LAB;; Patent DES in mRCA & p-mLAD. CULPRIT = m-d LAD 70% (just after D1 w/ Ost 60%) FFR 0.75 => DES PCI m-dLAD & PTCA ost D1 (overlapping prior stent distally & crossing D2 w/  Ost D2 post PTCA). RIGHT HEART CATH PRESSURES,  & CO-CI ARE NORMAL. NO PULM HTN   RIGHT/LEFT HEART CATH AND CORONARY ANGIOGRAPHY N/A 07/04/2021   Procedure: RIGHT/LEFT HEART CATH AND CORONARY ANGIOGRAPHY;  Surgeon: Marykay Lex, MD;  Location: [facility] INVASIVE CV LAB;  Service: Cardiovascular;; moderately elevated LVEDP of 20 mmHg, and PCWP-28 mmHg -> mean PAP of 24 mmHg.  Moderate AS-mean gradient 33 mmHg.   (Similar to Echo 26 mmHg); borderline low BP.  CO-CI 9.2 and 4.0.. Stable CAD W/ progression of ISR of LAD stent at D2 => PCI   RIGHT/LEFT HEART CATH AND CORONARY ANGIOGRAPHY N/A 05/27/2023   Procedure: RIGHT/LEFT HEART CATH AND CORONARY ANGIOGRAPHY;  Surgeon: Marykay Lex, MD;  Location: [facility] INVASIVE CV LAB;  Service: Cardiovascular;  Laterality: N/A;   ROTATOR CUFF REPAIR Bilateral last one 2014   TONSILLECTOMY  age 15   TRANSTHORACIC ECHOCARDIOGRAM  09/29/2020   EF > 75%, Hyperdynamic. No RWMA. Mild LVH. ~ DD with mild LA dilation. Mild AI. Normal RV size & fxn, ? Dilated IVC c/w CVP ~ 15 mHg.   TRANSTHORACIC ECHOCARDIOGRAM  04/12/2021   EF estimated 65%.  Normal LV size and function.  No RWMA.  Mild LVH.  GRII DD.  Elevated LVEDP (c/w HFpEF).  Normal RV size.  Mildly elevated PAP estimated 36 mmHg.  Mild MR.  Mild AS (mean gradient 26 mmHg.  DI 0.32.  Aortic root 40 mm, ascending aorta 41 mm.   URETEROSCOPIC LASER LITHOTRIPSY STONE EXTRACTIONS/  STENT PLACEMENT Bilateral 09-10-2007   dr Annabell Howells  [facility]   per pt has had several prior ureteroscopic stone extraction since age 38 , last one 09-10-2007   Zio patch monitor:  02/2021   Probably Sinus Rhythm: Heart rate 56-146 bpm, average 83 bpm.  Frequent PACs noted with rare isolated PVCs.  Multiple episodes of PAT/atrial runs-longest 23 seconds average heart rate 95 bpm, fastest 5 beats at a max rate of 231 bpm.  No evidence of A-fib or atrial flutter.  No prolonged or sustained SVT/PAT.     Home Medications:  Prior to Admission medications   Medication Sig Start Date End Date Taking? Authorizing Provider  acetaminophen (TYLENOL) 500 MG tablet Take 1,000 mg by mouth every 6 (six) hours as needed for mild pain (pain score 1-3) or moderate pain (pain score 4-6).   Yes [provider]  Adalimumab-bwwd (HADLIMA) 40 MG/0.8ML SOSY Inject 40 mg into the skin every 14 (fourteen) days.   Yes [provider]  apixaban (ELIQUIS) 5 MG TABS  tablet Take 1 tablet (5 mg total) by mouth 2 (two) times daily. 01/12/21  Yes Fenton, Clint R, PA  Calcium Carbonate-Vit D-Min (CALCIUM 1200 PO) Take 1 tablet by mouth at bedtime.   Yes [provider]  cetirizine (ZYRTEC) 10 MG tablet Take 10 mg by mouth in the morning.   Yes [provider]  Dorzolamide HCl-Timolol Mal PF 2-0.5 % SOLN Place 1 drop into both eyes at bedtime.   Yes [provider]  empagliflozin (JARDIANCE) 25 MG TABS tablet Take 12.5 mg by mouth daily. 12/26/21  Yes [provider]  ferrous sulfate 325 (65 FE) MG tablet Take 325 mg by mouth every Monday, Wednesday, and Friday.   Yes Ronney Asters, NP  furosemide (LASIX) 20 MG tablet Take 20 mg by mouth 2 (two) times a week. Monday and Thursday   Yes [provider]  galantamine (RAZADYNE ER) 24 MG 24 hr capsule Take 24 mg by mouth in the morning. 08/30/20  Yes [provider]  hydrOXYzine (ATARAX) 10 MG tablet Take 10 mg by mouth at bedtime as needed for anxiety or itching. 04/21/23  Yes [provider]  ibuprofen (ADVIL) 200 MG tablet Take 200 mg by mouth every 6 (six) hours as needed for moderate pain (pain score 4-6).   Yes [provider]  memantine (NAMENDA) 10 MG tablet Take 20 mg by mouth at bedtime. 08/30/20  Yes [provider]  metFORMIN (GLUCOPHAGE-XR) 500 MG 24 hr tablet Take 500 mg by mouth at bedtime.   Yes [provider]  Multiple Vitamins-Minerals (MULTIVITAMIN WITH MINERALS) tablet Take 1 tablet by mouth daily.  Yes [provider]  nitroGLYCERIN (NITROSTAT) 0.4 MG SL tablet Place 1 tablet (0.4 mg total) under the tongue every 5 (five) minutes as needed. 07/04/21  Yes Arty Baumgartner, NP  omeprazole (PRILOSEC OTC) 20 MG tablet Take 20 mg by mouth daily as needed (Acid reflux).   Yes [provider]  predniSONE (DELTASONE) 5 MG tablet Take 1 tablet by mouth once daily with breakfast 03/07/23  Yes Oretha Milch, MD  ranolazine (RANEXA) 500 MG 12 hr tablet Take 1 tablet (500 mg total) by mouth 2 (two) times daily. 11/11/22  Yes Marykay Lex, MD  rosuvastatin (CRESTOR) 40 MG tablet Take 1 tablet (40 mg total) by mouth daily. Patient taking differently: Take 40 mg by mouth at bedtime. 12/28/21  Yes Marykay Lex, MD  sertraline (ZOLOFT) 100 MG tablet Take by mouth. 05/05/23  Yes [provider]  tamsulosin (FLOMAX) 0.4 MG CAPS capsule Take 1 capsule by mouth once daily Patient taking differently: Take 0.4 mg by mouth daily as needed (kidney stones). 03/13/21  Yes McKenzie, Mardene Celeste, MD    Inpatient Medications: Scheduled Meds:  apixaban  5 mg Oral BID   empagliflozin  10 mg Oral Daily   [START ON 06/05/2023] furosemide  20 mg Oral Once per day on Monday Thursday   galantamine  24 mg Oral q AM   insulin aspart  0-5 Units Subcutaneous QHS   insulin aspart  0-6 Units Subcutaneous TID WC   memantine  20 mg Oral QHS   predniSONE  5 mg Oral Q breakfast   ranolazine  500 mg Oral BID   rosuvastatin  40 mg Oral Daily   sodium chloride flush  3 mL Intravenous Q12H   Continuous Infusions:  PRN Meds: acetaminophen **OR** acetaminophen, ipratropium-albuterol, ondansetron **OR** ondansetron (ZOFRAN) IV, senna-docusate  Allergies:    Allergies  Allergen Reactions   Hydrocodone Shortness Of Breath    In combination with decongestants   Other Palpitations and Other (See Comments)    ALL DECONGESTANTS - CAUSE PALPITATIONS; THROWS HEART RHYTHM OUT OF BALANCE   Oxycodone Shortness Of Breath   Sudafed [Pseudoephedrine] Palpitations   Tramadol Shortness Of Breath and Rash    flushing   Lopid [Gemfibrozil] Other (See Comments)    Dizziness      Social History:   Social History   Socioeconomic History   Marital status: Married    Spouse name: Not on file   Number of children: 1   Years of education: Not on file   Highest education level: Not on file  Occupational History    Occupation: Retired Psychologist, sport and exercise  Tobacco Use   Smoking status: Former    Types: Cigars    Quit date: 09/29/1997    Years since quitting: 25.6   Smokeless tobacco: Former    Types: Snuff, Chew   Tobacco comments:    Form smoker and chew tobacco 01/25/2021  Vaping Use   Vaping status: Never Used  Substance and Sexual Activity   Alcohol use: No   Drug use: No   Sexual activity: Not on file  Other Topics Concern   Not on file  Social History Narrative   Pt lives in [place] with spouse.     Retired.    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 -> subsequently worked in [employer] (seconded to [employer]).       After retiring from [employer] -- [employer] an [employer]  [employer].        Had been very active as an Charity fundraiser"  - now doing his best to "stay out of this mess of politics" -- has declined efforts to get him to run for office.      Now is primary caregiver for his wife [name]) - who has metastatic CA & has had CVA x 2.  Pretty much "total care".  Very emotionally taxing.      He has plans to build homes for all of his children & grandchildren on their property - to ensure they all have a place to live - Not transferable   Social Drivers of Corporate investment banker Strain: Not on file  Food Insecurity: No Food Insecurity (10/18/2021)   [facility], [facility]   Hunger Vital Sign    Worried About Running Out of Food in the Last Year: Never true    Ran Out of Food in the Last Year: Never true  Transportation Needs: Not on file  Physical Activity: Not on file  Stress: Not on file  Social Connections: Unknown (08/17/2021)   Received from [facility], [facility]   Social Network    Social Network: Not on file  Intimate Partner Violence: Unknown (07/10/2021)   [facility] from [facility], [facility]   HITS    Physically Hurt: Not on file    Insult or Talk Down To: Not on  file    Threaten Physical Harm: Not on file    Scream or Curse: Not on file    Family History:   Family History  Problem Relation Age of Onset   Coronary artery disease Mother    Breast cancer Mother    Colon cancer Mother    Heart disease Maternal Grandmother        MI   Stroke Paternal Grandfather        CVA   Coronary artery disease Paternal Grandfather    Esophageal cancer Neg Hx    Stomach cancer Neg Hx    Rectal cancer Neg Hx      ROS:  Please see the history of present illness.  All other ROS reviewed and negative.     Physical Exam/Data:   Vitals:   06/03/23 0400 06/03/23 0500 06/03/23 0600 06/03/23 0700  BP: 134/78 (!) 144/75 (!) 145/72 (!) 141/76  Pulse: 66 60 65 70  Resp: 18 17 (!) 21 20  Temp:   98.1 F (36.7 C) 98 F (36.7 C)  TempSrc:   Oral   SpO2: 100% 100% 100% 100%   No intake or output data in the 24 hours ending 06/03/23 0815    05/27/2023    9:33 AM 05/14/2023    9:24 AM 03/29/2023    9:10 PM  Last 3 Weights  Weight (lbs) 200 lb 206 lb 12.8 oz 210 lb 5.1 oz  Weight (kg) 90.719 kg 93.804 kg 95.4 kg     There is no height or weight on file to calculate BMI.  General:  Well nourished, well developed, in no acute distress HEENT: normal Neck: no JVD Cardiac:  normal S1, S2; RRR; 3/6 harsh systolic murmur with obscured second heart sound. Heard best at RUSB but can be heard in all heart fields. Lungs: fine crackles at bases Abd: soft, nontender, no hepatomegaly  Ext: no edema Musculoskeletal:  No deformities, BUE and BLE strength normal and equal Skin: warm and dry  Neuro:  CNs 2-12 intact, no focal abnormalities  noted Psych:  Normal affect   EKG:  The EKG was personally reviewed and demonstrates:  sinus with HR 84, baseline artifact noted.  Telemetry:  Telemetry was personally reviewed and demonstrates:  sinus, HRs 60-70s  Cardiac Studies & Procedures    ______________________________________________________________________________________________ CARDIAC CATHETERIZATION  CARDIAC CATHETERIZATION 05/27/2023  Narrative Images from the original result were not included.    Previously placed Prox LAD to Mid LAD stent of unknown type is  widely patent the second overlapping mid LAD stent has 50% in-stent restenosis (at the previous PTCA site) with 55% stenosed side branch in 2nd Diag.   Dist Cx lesion is 45% stenosed.   Previously placed Mid RCA stent of unknown type is  widely patent.   RPDA lesion is 50% stenosed.   Normal Right Heart Cath Numbers/Pressures   Severe Aortic Stenosis by Echocardiogram (able to cross with wire but not with catheter due to tortuosity in the innominate artery. Widely patent RCA stents with left 50% ISR of the LAD stent at the previous PTCA site-Diag bifurcation with 55% ostial Diag stenosis (does not appear to be flow-limiting) here Relatively Normal Right Heart Cath Pressures and Measurements: -RAP 5 mmHg, RV P-EDP 27/2-6 mmHg; PAP-mean 28/14-22 mmHg, PCWP 10 million mercury. -AO P-MAP 119/65-91 mmHg. -Ao sat 99%, PA sat 70%; Fick Cardiac Output 5.61-2.55.   RECOMMENDATIONS Discharge home today after bedrest.  Has plan to follow-up with Dr. Excell Seltzer [facility] for TAVR evaluation Reassess symptoms after TAVR, if still symptomatic, could consider ischemic evaluation of the LAD with a Myoview stress test. Restart Eliquis p.m. 05/27/2023; restart Jardiance 05/28/2023; hold metformin 48 hours post cath   Bryan Lemma, MD  Findings Coronary Findings Diagnostic  Dominance: Right  Left Main Vessel is normal in caliber and large.  Left Anterior Descending Previously placed Prox LAD to Mid LAD stent of unknown type is  widely patent. The lesion is located proximal to the major branch. Mid LAD lesion is 50% stenosed with 55% stenosed side branch in 2nd Diag. The lesion is located at the  bifurcation and focal. The lesion was previously treated using a drug eluting stent and angioplasty between 1-2 years ago. Previously placed stent displays restenosis.  First Diagonal Branch Vessel is small in size.  First Septal Branch Vessel is small in size.  Second Diagonal Branch  Second Septal Branch Vessel is small in size.  Third Diagonal Branch Vessel is small in size.  Left Circumflex Vessel is large. Vessel is angiographically normal. Dist Cx lesion is 45% stenosed. The lesion is eccentric and smooth.  First Obtuse Marginal Branch Vessel is small in size.  Second Obtuse Marginal Branch Vessel is small in size.  First Left Posterolateral Branch Vessel is small in size.  Second Left Posterolateral Branch Vessel is small in size.  Third Left Posterolateral Branch Vessel is moderate in size. Vessel is angiographically normal.  Left Posterior Atrioventricular Artery Vessel is angiographically normal.  Right Coronary Artery The vessel exhibits minimal luminal irregularities. Previously placed Mid RCA stent of unknown type is  widely patent. Previously placed stent displays no restenosis. Widely patent  Acute Marginal Branch Vessel is small in size.  Right Ventricular Branch Vessel is small in size.  Right Posterior Descending Artery Vessel is moderate in size. Vessel is angiographically normal. Wraparound PDA covers the distal anterior apex RPDA lesion is 50% stenosed.  Right Posterior Atrioventricular Artery Vessel is small in size.  First Right Posterolateral Branch Vessel is small in size.  Intervention  No interventions have been documented.   CARDIAC CATHETERIZATION  CARDIAC CATHETERIZATION 07/04/2021  Narrative   Dist Cx lesion is 45% stenosed.   Mid LAD to Dist LAD overlapping stents have 75% stenosed just distal to2nd Diag with 70% stenosed side branch in 2nd Diag.   RPDA lesion is 60% stenosed.   Previously placed Mid RCA stent  (unknown type) is  widely patent.   Prox LAD to Mid LAD stent is widely patent   Mid RCA stent was previously treated.   Balloon angioplasty was performed using a BALLN SAPPHIRE 2.0X12.   Balloon angioplasty was performed using a BALLN [place] EMERGE MR 2.5X8.   Post intervention, there is a 25% residual stenosis.   Post intervention, the side branch was reduced to 40% residual stenosis.   The left ventricular systolic function is normal.   LV end diastolic pressure is moderately elevated.   Hemodynamic findings consistent with mild pulmonary hypertension.   There is moderate aortic valve stenosis.  SUMMARY Stable two-vessel disease with exception of progression of in-stent restenosis disease in the stented portion of the LAD-2nd Diag is back to the LAD and branch with widely patent stent in the RCA. Culprit lesion is likely the LAD -2nd Diag bifurcation lesion which is in-stent restenosis (25% in the LAD and jailed sidebranch 70% stenosis of the 2nd Diag. Successful [place] balloon PTCA of the LAD ISR with semicompliant balloon PTCA kissing balloon into the 2nd Diag side branch -> reducing lesions to 25% LAD, 30% diagonal with TIMI-3 flow preserved. Borderline Pulmonary Hypertension with mean PA pressure of 24 mmHg in the setting of LVEDP of 12 mmHg and PCWP pressure of 28 mmHg. Normal Cardiac Output-Index by Fick: 9.2-4.0. At least moderate aortic stenosis with mean gradient in the Cath Lab at 33 million mercury, higher than echo gradient of 26 mm reviewed. Borderline systolic blood pressures.   Recommendations: With moderately elevated LVEDP, will have him increase his furosemide dose to 40 mg on Monday Wednesday Friday (with supplemental potassium K-Dur 20 mEq) Likely follow-up echocardiogram in 6 months to reassess severity of aortic stenosis. Restart DOAC this evening roughly 1800 -> will discharge on Eliquis and Plavix without aspirin May need to reduce antihypertensive agents due to low blood  pressures in the Cath Lab.  Follow-up as scheduled   Bryan Lemma, MD  Findings Coronary Findings Diagnostic  Dominance: Right  Left Main Vessel is normal in caliber and large.  Left Anterior Descending Non-stenotic Prox LAD to Mid LAD lesion was previously treated. Mid LAD to Dist LAD lesion is 75% stenosed with 70% stenosed side branch in 2nd Diag. The lesion is discrete and irregular. The lesion was previously treated using a drug eluting stent between 1-2 years ago. Previously placed stent displays restenosis.  First Diagonal Branch Vessel is small in size.  First Septal Branch Vessel is small in size.  Second Diagonal Branch  Second Septal Branch Vessel is small in size.  Third Diagonal Branch Vessel is small in size.  Left Circumflex Vessel is large. Vessel is angiographically normal. Dist Cx lesion is 45% stenosed. The lesion is eccentric and smooth.  First Obtuse Marginal Branch Vessel is small in size.  Second Obtuse Marginal Branch Vessel is small in size.  First Left Posterolateral Branch Vessel is small in size.  Second Left Posterolateral Branch Vessel is small in size.  Third Left Posterolateral Branch Vessel is moderate in size. Vessel is angiographically normal.  Left Posterior Atrioventricular Artery Vessel is angiographically  normal.  Right Coronary Artery The vessel exhibits minimal luminal irregularities. Previously placed Mid RCA stent (unknown type) is  widely patent. Widely patent  Acute Marginal Branch Vessel is small in size.  Right Ventricular Branch Vessel is small in size.  Right Posterior Descending Artery Vessel is moderate in size. Vessel is angiographically normal. Wraparound PDA covers the distal anterior apex RPDA lesion is 60% stenosed.  Right Posterior Atrioventricular Artery Vessel is small in size.  First Right Posterolateral Branch Vessel is small in size.  Intervention  Mid LAD to Dist LAD lesion with  side branch in 2nd Diag Angioplasty - Main Branch Lesion length:  25 mm. CATH VISTA GUIDE 6FR XBLAD3.5 guide catheter was inserted. WIRE HI TORQ WHISPER MS 190CM guidewire used to cross lesion. Balloon angioplasty was performed using a BALLN [place] EMERGE MR 2.5X8. Initially, the [place] balloons would not cross, therefore semicompliant 2.0 followed by 2.5 mm balloons were used. BALLN SAPPHIRE 2.0X12 10 ATM, 20 seconds BALLN [place] EUPHORA RX 2.5X15 14 ATM, 20 seconds, also uses conceive balloon fashion with balloon in the diagonal. Maximum pressure: 16 atm. Inflation time: 20 sec. WIRE RUNTHROUGH .[id number] used as the initial wire.  However since balloons would not cross over this wire, a second buddy wire was advanced and used as the primary request. Several inflations were made with a 2.5 mm x 8 mm  balloon to allow for focal expansion. Angioplasty - Side Branch Lesion length:  8 mm. CATH VISTA GUIDE 6FR XBLAD3.5 guide catheter was inserted. WIRE ASAHI PROWATER 180CM guidewire used to cross lesion. Balloon angioplasty was performed using a BALLN SAPPHIRE 2.0X12. Maximum pressure: 8 atm. Inflation time: 20 sec. Kissing balloon with the 2.5 mm x 15 mm compliant balloon in the LAD. Post-Intervention Lesion Assessment The intervention was successful. Intended to be suboptimal to avoid further impingement on the LAD stent Pre-interventional TIMI flow is 3. Post-intervention TIMI flow is 3. Treated lesion length:  8 mm. No complications occurred at this lesion. There is a 25% residual stenosis in the main branch post intervention. There is a 40% residual stenosis in the side branch post intervention.   STRESS TESTS  NM PET CT CARDIAC PERFUSION MULTI W/ABSOLUTE BLOODFLOW 03/12/2022  Narrative   Normal perfusion study. Normal LVEF and normal LVEF reserve. No TID. Myocardial blood flow reserve is mildly reduced (1.78). Given recent catheterization without 3-vessel CAD, suspect reduced myocardial blood flow reserve is  related to coronary microvascular disease.   LV perfusion is normal. There is no evidence of ischemia. There is no evidence of infarction.   Rest left ventricular function is normal. Rest EF: 48 %. Stress left ventricular function is normal. Stress EF: 54 %. End diastolic cavity size is mildly enlarged.   Myocardial blood flow was computed to be 0.22ml/g/min at rest and 1.45ml/g/min at stress. Global myocardial blood flow reserve was 1.78 and was mildly abnormal.   Coronary calcium assessment not performed due to prior revascularization.   The study is normal. The study is low risk.  Electronically signed by Lennie Odor, MD ___________________________________________________________________________________________  Francia Greaves: OVER-READ INTERPRETATION CARDIAC CT CHEST  The following report is an over-read performed by radiologist Dr. Gaylyn Rong of [facility] on 03/12/2022. This over-read does not include interpretation of cardiac or coronary anatomy or pathology. The cardiac PET-CT interpretation by the cardiologist is attached.  COMPARISON:  Chest CT 04/04/2021  FINDINGS: Extracardiac Vascular: Atherosclerotic thoracic aorta. Mild stable aneurysmal dilation of the ascending thoracic aorta at 4.1 cm in  diameter.  Mediastinum: Unremarkable  Lung: Stable mosaic attenuation in the lungs.  Included Upper Abdomen: Unremarkable  Musculoskeletal: Thoracic spondylosis.  IMPRESSION: 1. Stable mild aneurysmal dilation of the ascending thoracic aorta at 4.1 cm in diameter. Recommend annual imaging followup by CTA or MRA. This recommendation follows 2010 ACCF/AHA/AATS/ACR/ASA/SCA/SCAI/SIR/STS/SVM Guidelines for the Diagnosis and Management of Patients with Thoracic Aortic Disease. Circulation. 2010; 121: J478-G956 2. Stable mosaic attenuation in the lungs, no change from prior chest CT 04/04/2021. 3. Aortic atherosclerosis.  Aortic Atherosclerosis  (ICD10-I70.0).   Electronically Signed By: Gaylyn Rong M.D. On: 03/12/2022 14:00   ECHOCARDIOGRAM  ECHOCARDIOGRAM COMPLETE 05/14/2023  Narrative ECHOCARDIOGRAM REPORT    Patient Name:   Kenneth [name] Date of Exam: 05/14/2023 Medical Rec #:  [id number]       Height:       75.0 in Accession #:    [id number]      Weight:       206.8 lb Date of Birth:  [birth year]       BSA:          2.226 m Patient Age:    77 years        BP:           124/80 mmHg Patient Gender: M               HR:           70 bpm. Exam Location:  [address]  Procedure: 2D Echo, Cardiac Doppler and Color Doppler  Indications:    I35.0 Aortic Stenosis  History:        Patient has prior history of Echocardiogram examinations, most recent 04/12/2021. CAD; Risk Factors:Hypertension, Dyslipidemia, Diabetes and Sleep Apnea.  Sonographer:    Sedonia Small Rodgers-Jones RDCS Referring Phys: [id number] RAKESH V ALVA  IMPRESSIONS   1. Left ventricular ejection fraction, by estimation, is 60 to 65%. The left ventricle has normal function. The left ventricle has no regional wall motion abnormalities. There is mild concentric left ventricular hypertrophy. Left ventricular diastolic parameters are consistent with Grade II diastolic dysfunction (pseudonormalization). 2. Right ventricular systolic function is normal. The right ventricular size is normal. There is normal pulmonary artery systolic pressure. The estimated right ventricular systolic pressure is 34.6 mmHg. 3. Left atrial size was moderately dilated. 4. The mitral valve is degenerative. Mild mitral valve regurgitation. No evidence of mitral stenosis. Moderate mitral annular calcification. 5. The tricuspid valve is abnormal. Tricuspid valve regurgitation is moderate. 6. The aortic valve is tricuspid. There is severe calcifcation of the aortic valve. Aortic valve regurgitation is mild. Severe aortic valve stenosis. Aortic valve area, by VTI measures 0.88 cm. Aortic  valve mean gradient measures 42.0 mmHg. 7. Aortic dilatation noted. There is mild dilatation of the ascending aorta, measuring 41 mm. 8. The inferior vena cava is normal in size with greater than 50% respiratory variability, suggesting right atrial pressure of 3 mmHg.  FINDINGS Left Ventricle: Left ventricular ejection fraction, by estimation, is 60 to 65%. The left ventricle has normal function. The left ventricle has no regional wall motion abnormalities. The left ventricular internal cavity size was normal in size. There is mild concentric left ventricular hypertrophy. Left ventricular diastolic parameters are consistent with Grade II diastolic dysfunction (pseudonormalization).  Right Ventricle: The right ventricular size is normal. No increase in right ventricular wall thickness. Right ventricular systolic function is normal. There is normal pulmonary artery systolic pressure. The tricuspid regurgitant velocity is 2.81 m/s, and with an assumed right atrial  pressure of 3 mmHg, the estimated right ventricular systolic pressure is 34.6 mmHg.  Left Atrium: Left atrial size was moderately dilated.  Right Atrium: Right atrial size was normal in size.  Pericardium: There is no evidence of pericardial effusion.  Mitral Valve: The mitral valve is degenerative in appearance. Moderate mitral annular calcification. Mild mitral valve regurgitation. No evidence of mitral valve stenosis.  Tricuspid Valve: The tricuspid valve is abnormal. Tricuspid valve regurgitation is moderate.  The aortic valve is tricuspid. There is severe calcifcation of the aortic valve. Aortic valve regurgitation is mild. Severe aortic stenosis is present. .  Pulmonic Valve: The pulmonic valve was normal in structure. Pulmonic valve regurgitation is not visualized.  Aorta: The aortic root is normal in size and structure and aortic dilatation noted. There is mild dilatation of the ascending aorta, measuring 41 mm.  Venous: The  inferior vena cava is normal in size with greater than 50% respiratory variability, suggesting right atrial pressure of 3 mmHg.  IAS/Shunts: No atrial level shunt detected by color flow Doppler.   LEFT VENTRICLE PLAX 2D LVIDd:         5.00 cm LVIDs:         3.00 cm LV PW:         1.30 cm LV IVS:        1.30 cm LVOT diam:     2.10 cm LV SV:         69 LV SV Index:   31 LVOT Area:     3.46 cm   RIGHT VENTRICLE             IVC RV Basal diam:  4.00 cm     IVC diam: 1.60 cm RV S prime:     11.33 cm/s TAPSE (M-mode): 1.7 cm  LEFT ATRIUM              Index        RIGHT ATRIUM           Index LA diam:        5.30 cm  2.38 cm/m   RA Area:     17.50 cm LA Vol (A2C):   104.0 ml 46.72 ml/m  RA Volume:   50.40 ml  22.64 ml/m LA Vol (A4C):   77.1 ml  34.64 ml/m LA Biplane Vol: 91.8 ml  41.24 ml/m AORTIC VALVE AV Area (Vmax):    0.81 cm AV Area (Vmean):   0.78 cm AV Area (VTI):     0.88 cm AV Vmax:           374.20 cm/s AV Vmean:          270.000 cm/s AV VTI:            0.777 m AV Peak Grad:      56.0 mmHg AV Mean Grad:      42.0 mmHg LVOT Vmax:         87.40 cm/s LVOT Vmean:        60.800 cm/s LVOT VTI:          0.198 m LVOT/AV VTI ratio: 0.25 AI PHT:            665 msec  AORTA Ao Root diam: 3.60 cm Ao Asc diam:  4.10 cm  MITRAL VALVE                TRICUSPID VALVE MV Area (PHT): 3.61 cm     TR Peak grad:   31.6 mmHg MV Decel  Time: 210 msec     TR Vmax:        281.00 cm/s MV E velocity: 142.33 cm/s MV A velocity: 74.27 cm/s   SHUNTS MV E/A ratio:  1.92         Systemic VTI:  0.20 m Systemic Diam: 2.10 cm  Dalton McleanMD Electronically signed by Wilfred Lacy Signature Date/Time: 05/14/2023/1:30:56 PM    Final    MONITORS  LONG TERM MONITOR (3-14 DAYS) 02/14/2021  Narrative  Predominant underlying rhythm Sinus Rhythm: Average heart rate 83 bpm, heart rate range 56-146 bpm.  Frequent PACs noted (6.1%); rare isolated PVCs.  Multiple-364 episodes of  PSVT/PAT (Paroxysmal Supraventricular or Atrial Tachycardia) noted.  Longest lasting history of some 23 seconds with an average rate of 95 bpm. Fastest interval was 5 beats at a maximum rate of 231 bpm.  Diary did not note any triggers for symptoms.  No evidence of atrial fibrillation, atrial flutter or prolonged sustained SVT or PAT  Monitor shows mostly sinus rhythm but does have lots of premature atrial beats quite a few short lived runs of paroxysmal atrial/supraventricular tachycardia -> nonsustained.  Longest episode is only 23 seconds.  Not likely enough to cause it.  Best option to Rx PACs is Beta Blocker.  Bryan Lemma c       ______________________________________________________________________________________________      Laboratory Data:  High Sensitivity Troponin:   Recent Labs  Lab 06/02/23 1815 06/02/23 2015  TROPONINIHS 9 9     Chemistry Recent Labs  Lab 05/27/23 1503 06/02/23 1815 06/03/23 0438  NA 142 137 138  K 3.7 4.2 3.7  CL  --  105 108  CO2  --  21* 24  GLUCOSE  --  142* 116*  BUN  --  15 15  CREATININE  --  1.17 1.01  CALCIUM  --  9.4 8.5*  GFRNONAA  --  >60 >60  ANIONGAP  --  11 6    No results for input(s): "PROT", "ALBUMIN", "AST", "ALT", "ALKPHOS", "BILITOT" in the last 168 hours. Hematology Recent Labs  Lab 05/27/23 1503 06/02/23 1815 06/03/23 0438  WBC  --  8.2 7.2  RBC  --  4.56 4.04*  HGB 12.6* 13.2 11.9*  HCT 37.0* 40.4 35.5*  MCV  --  88.6 87.9  MCH  --  28.9 29.5  MCHC  --  32.7 33.5  RDW  --  13.6 13.5  PLT  --  187 160   BNP Recent Labs  Lab 06/02/23 1815  BNP 47.9    DDimer  Recent Labs  Lab 06/03/23 0234  DDIMER <0.27     Radiology/Studies:  CT ANGIO ABDOMEN PELVIS  W &/OR WO CONTRAST Result Date: 06/03/2023 CLINICAL DATA:  Aortic valve replacement, TAVR, preoperative evaluation. EXAM: CT ANGIOGRAPHY CHEST, ABDOMEN AND PELVIS TECHNIQUE: Non-contrast CT of the chest was initially obtained.  Multidetector CT imaging through the chest, abdomen and pelvis was performed using the standard protocol during bolus administration of intravenous contrast. Multiplanar reconstructed images and MIPs were obtained and reviewed to evaluate the vascular anatomy. RADIATION DOSE REDUCTION: This exam was performed according to the departmental dose-optimization program which includes automated exposure control, adjustment of the mA and/or kV according to patient size and/or use of iterative reconstruction technique. CONTRAST:  OMNIPAQUE IOHEXOL 350 MG/ML SOLN COMPARISON:  03/27/2021, 09/07/2019. FINDINGS: CTA CHEST FINDINGS Cardiovascular: The heart is borderline enlarged and there is a trace pericardial effusion. Multi-vessel coronary artery calcifications are noted. There is calcification of  the aortic valve. There is atherosclerotic calcification of the aorta with aneurysmal dilatation of the ascending aorta measuring 4.0 cm. The pulmonary trunk is normal in caliber. No dissection is seen. Mediastinum/Nodes: No mediastinal, hilar, or axillary lymphadenopathy. The thyroid gland, trachea, and esophagus are within normal limits. Lungs/Pleura: Stable fibrotic changes and ground-glass attenuation is present in the lungs bilaterally. No consolidation, effusion, or pneumothorax is seen. Mild apical pleural and parenchymal thickening is present on the right. Musculoskeletal: Degenerative changes are present in the thoracic spine. No acute osseous abnormality is seen. Review of the MIP images confirms the above findings. CTA ABDOMEN AND PELVIS FINDINGS VASCULAR Aorta: Normal caliber aorta without dissection, vasculitis or significant stenosis. Aortic atherosclerosis. The abdominal aorta measures up to 2.5 cm in diameter. Celiac: Patent without evidence of aneurysm, dissection, vasculitis or significant stenosis. SMA: Patent without evidence of aneurysm, dissection, vasculitis or significant stenosis. Renals: Both renal  arteries are patent without evidence of aneurysm, dissection, vasculitis, fibromuscular dysplasia or significant stenosis. IMA: Patent without evidence of aneurysm, dissection, vasculitis or significant stenosis. Inflow: Patent without evidence of aneurysm, dissection, vasculitis or significant stenosis. Veins: No obvious venous abnormality within the limitations of this arterial phase study. Review of the MIP images confirms the above findings. NON-VASCULAR Hepatobiliary: No focal liver abnormality is seen. No gallstones, gallbladder wall thickening, or biliary dilatation. Pancreas: Unremarkable. No pancreatic ductal dilatation or surrounding inflammatory changes. Spleen: Normal size. A small hypodensity is present in the anterior aspect of the spleen, likely cyst or hemangioma. Adrenals/Urinary Tract: No adrenal nodule or mass. The kidneys enhance symmetrically. Nonobstructive renal calculi are present bilaterally. There is no hydronephrosis. The bladder is unremarkable. Stomach/Bowel: Small hiatal hernia is noted. Stomach is within normal limits. Appendix is surgically absent. No evidence of bowel wall thickening, distention, or inflammatory changes. No free air or pneumatosis is seen. Scattered diverticula are present along the colon without evidence of diverticulitis. Lymphatic: No abdominal or pelvic lymphadenopathy. Reproductive: Prostate is unremarkable. Other: No abdominopelvic ascites. A small fat containing umbilical hernia is present. Musculoskeletal: Degenerative changes are present in the lumbar spine. Bilateral pars defects are noted at L5 with mild anterolisthesis at L5-S1. No acute osseous abnormality is seen. Review of the MIP images confirms the above findings. IMPRESSION: 1. Aortic atherosclerosis with mild aneurysmal dilatation of the ascending aorta measuring 4.0 cm. Recommend annual imaging followup by CTA or MRA. This recommendation follows 2010 ACCF/AHA/AATS/ACR/ASA/SCA/SCAI/SIR/STS/SVM  Guidelines for the Diagnosis and Management of Patients with Thoracic Aortic Disease. Circulation. 2010; 121: N629-B284. Aortic aneurysm NOS (ICD10-I71.9) 2. Coronary artery calcifications. 3. Stable fibrotic and interstitial changes in the lungs bilaterally. No acute infiltrate is seen. 4. Small hiatal hernia. 5. Bilateral nephrolithiasis. 6. Diverticulosis without diverticulitis. Electronically Signed   By: Thornell Sartorius M.D.   On: 06/03/2023 07:48   CT ANGIO CHEST AORTA W/CM & OR WO/CM Result Date: 06/03/2023 CLINICAL DATA:  Aortic valve replacement, TAVR, preoperative evaluation. EXAM: CT ANGIOGRAPHY CHEST, ABDOMEN AND PELVIS TECHNIQUE: Non-contrast CT of the chest was initially obtained. Multidetector CT imaging through the chest, abdomen and pelvis was performed using the standard protocol during bolus administration of intravenous contrast. Multiplanar reconstructed images and MIPs were obtained and reviewed to evaluate the vascular anatomy. RADIATION DOSE REDUCTION: This exam was performed according to the departmental dose-optimization program which includes automated exposure control, adjustment of the mA and/or kV according to patient size and/or use of iterative reconstruction technique. CONTRAST:  OMNIPAQUE IOHEXOL 350 MG/ML SOLN COMPARISON:  03/27/2021, 09/07/2019. FINDINGS: CTA  CHEST FINDINGS Cardiovascular: The heart is borderline enlarged and there is a trace pericardial effusion. Multi-vessel coronary artery calcifications are noted. There is calcification of the aortic valve. There is atherosclerotic calcification of the aorta with aneurysmal dilatation of the ascending aorta measuring 4.0 cm. The pulmonary trunk is normal in caliber. No dissection is seen. Mediastinum/Nodes: No mediastinal, hilar, or axillary lymphadenopathy. The thyroid gland, trachea, and esophagus are within normal limits. Lungs/Pleura: Stable fibrotic changes and ground-glass attenuation is present in the lungs  bilaterally. No consolidation, effusion, or pneumothorax is seen. Mild apical pleural and parenchymal thickening is present on the right. Musculoskeletal: Degenerative changes are present in the thoracic spine. No acute osseous abnormality is seen. Review of the MIP images confirms the above findings. CTA ABDOMEN AND PELVIS FINDINGS VASCULAR Aorta: Normal caliber aorta without dissection, vasculitis or significant stenosis. Aortic atherosclerosis. The abdominal aorta measures up to 2.5 cm in diameter. Celiac: Patent without evidence of aneurysm, dissection, vasculitis or significant stenosis. SMA: Patent without evidence of aneurysm, dissection, vasculitis or significant stenosis. Renals: Both renal arteries are patent without evidence of aneurysm, dissection, vasculitis, fibromuscular dysplasia or significant stenosis. IMA: Patent without evidence of aneurysm, dissection, vasculitis or significant stenosis. Inflow: Patent without evidence of aneurysm, dissection, vasculitis or significant stenosis. Veins: No obvious venous abnormality within the limitations of this arterial phase study. Review of the MIP images confirms the above findings. NON-VASCULAR Hepatobiliary: No focal liver abnormality is seen. No gallstones, gallbladder wall thickening, or biliary dilatation. Pancreas: Unremarkable. No pancreatic ductal dilatation or surrounding inflammatory changes. Spleen: Normal size. A small hypodensity is present in the anterior aspect of the spleen, likely cyst or hemangioma. Adrenals/Urinary Tract: No adrenal nodule or mass. The kidneys enhance symmetrically. Nonobstructive renal calculi are present bilaterally. There is no hydronephrosis. The bladder is unremarkable. Stomach/Bowel: Small hiatal hernia is noted. Stomach is within normal limits. Appendix is surgically absent. No evidence of bowel wall thickening, distention, or inflammatory changes. No free air or pneumatosis is seen. Scattered diverticula are  present along the colon without evidence of diverticulitis. Lymphatic: No abdominal or pelvic lymphadenopathy. Reproductive: Prostate is unremarkable. Other: No abdominopelvic ascites. A small fat containing umbilical hernia is present. Musculoskeletal: Degenerative changes are present in the lumbar spine. Bilateral pars defects are noted at L5 with mild anterolisthesis at L5-S1. No acute osseous abnormality is seen. Review of the MIP images confirms the above findings. IMPRESSION: 1. Aortic atherosclerosis with mild aneurysmal dilatation of the ascending aorta measuring 4.0 cm. Recommend annual imaging followup by CTA or MRA. This recommendation follows 2010 ACCF/AHA/AATS/ACR/ASA/SCA/SCAI/SIR/STS/SVM Guidelines for the Diagnosis and Management of Patients with Thoracic Aortic Disease. Circulation. 2010; 121: G295-M841. Aortic aneurysm NOS (ICD10-I71.9) 2. Coronary artery calcifications. 3. Stable fibrotic and interstitial changes in the lungs bilaterally. No acute infiltrate is seen. 4. Small hiatal hernia. 5. Bilateral nephrolithiasis. 6. Diverticulosis without diverticulitis. Electronically Signed   By: Thornell Sartorius M.D.   On: 06/03/2023 07:48   DG Chest 2 View Result Date: 06/02/2023 CLINICAL DATA:  Chest pain EXAM: CHEST - 2 VIEW COMPARISON:  03/29/2023 FINDINGS: Frontal and lateral views of the chest demonstrate an unremarkable cardiac silhouette. No airspace disease, effusion, or pneumothorax. Stable scarring. No acute bony abnormalities. IMPRESSION: 1. No acute intrathoracic process. Electronically Signed   By: Sharlet Salina M.D.   On: 06/02/2023 21:02     Procedure Type: Isolated AVR Perioperative Outcome Estimate % Operative Mortality 3% Morbidity & Mortality 13.3% Stroke 1.01% Renal Failure 1.98% Reoperation 4.87% Prolonged Ventilation 5.78%  Deep Sternal Wound Infection 0.045% Long Hospital Stay (>14 days) 6.65% Short Hospital Stay (<6  days)* 37.1%   _____________________________________  Pre Surgical Assessment: 5 M Walk Test  61M=16.91ft  5 Meter Walk Test- trial 1: 8.10 seconds 5 Meter Walk Test- trial 2: 8.3 seconds 5 Meter Walk Test- trial 3: 7.31 seconds 5 Meter Walk Test Average: 7.9 seconds  ____________________________________  [facility] Cardiomyopathy Questionnaire     06/03/2023    2:08 PM  KCCQ-12  1 a. Ability to shower/bathe Slightly limited  1 b. Ability to walk 1 block Extremely limited  1 c. Ability to hurry/jog Other, Did not do  2. Edema feet/ankles/legs Never over the past 2 weeks  3. Limited by fatigue All of the time  4. Limited by dyspnea All of the time  5. Sitting up / on 3+ pillows 3+ times a week, not every day  6. Limited enjoyment of life Extremely limited  7. Rest of life w/ symptoms Not at all satisfied  8 a. Participation in hobbies Severely limited  8 b. Participation in chores Severely limited  8 c. Visiting family/friends Severely limited      Assessment and Plan:   Kenneth [name] is a 78 y.o. male with symptoms of severe, stage D1 aortic stenosis with NYHA Class III symptoms of currently admitted for acute dyspnea on exertion.   Echo 05/14/2023 showed EF 60-65%, G2DD, mild MR, moderate TR, mild AI and severe aortic stenosis with a mean grad 42 mmHg, peak grad 56 mmHg, [name] 0.78 cm2, DVI 0.25, SVI 31.   L/RHC on 05/27/2023 which showed 50% mid LAD in-stent restenosis, 50% stenosis in sidebranch in D2, 45% distal left circumflex lesion, 50% RPDA lesion, wedge pressure 10 mmHg, PA pressure 28/14 with mean 22 mmHg. Cardiac output 5.61, cardiac index 2.55. Dr. Herbie Baltimore recommended reassessing his symptom after TAVR and if still symptomatic, could consider ischemic evaluation of the LAD using a Myoview stress test.   Since his heart cath on 05/27/23 he has had paroxysms of shortness of breath and atypical chest pain. He presented to the [facility] ED on 06/02/23 for evaluation. He has had  no evidence of heart failure or acute ischemia. ECG with normal sinus. CXR unremarkable. Respiratory panel, Ddimer, BNP and HStrop normal. 02 sats 100% on RA. Symptoms felt to be related to severe AS and structural heart consulted. Pre TAVR CTs were ordered by Dr. Jacques Navy and completed 06/03/23.  Cardiac gated CTA of the heart reveals anatomical characteristics consistent with aortic stenosis suitable for treatment by transcatheter aortic valve replacement without any significant complicating features and CTA of the aorta and iliac vessels demonstrate what appear to be adequate pelvic vascular access to facilitate a transfemoral approach. He sizes to a 29mm Edwards Sapien or 34 mm Medtronic Evolut FX. Would prefer a 29 mm Sapien given extensive CAD requiring multiple stents and 50% LAD ISR.   I have reviewed the natural history of aortic stenosis with the patient. We have discussed the limitations of medical therapy and the poor prognosis associated with symptomatic aortic stenosis. We have reviewed potential treatment options, including palliative medical therapy, conventional surgical aortic valve replacement, and transcatheter aortic valve replacement. We discussed treatment options in the context of this patient's specific comorbid medical conditions.    The patient's predicted risk of mortality with conventional aortic valve replacement is 3.0% primarily based on age, ILD on chronic steroids, atrial flutter, DMT2 and CAD. TAVR seems like a reasonable treatment option for this  patient pending formal cardiac surgical consultation. He has been booked to see Dr. Laneta Simmers 06/04/23 @ 3pm with plan to do this TAVR next Tuesday 06/10/23.     For questions or updates, please contact [facility] Please consult [email or web address] for contact info under    Signed, Cline Crock, PA-C  06/03/2023 8:15 AM  Patient seen, examined. Available data reviewed. Agree with findings, assessment, and plan as  outlined by Carlean Jews, PA-C.  The patient's wife and granddaughter are present at the bedside.  He is alert, oriented, in no distress.  HEENT is normal, JVP is normal, there are bilateral carotid bruits likely radiation of his AS murmur, the lungs are clear bilaterally, the heart is regular rate and rhythm with a 3/6 crescendo decrescendo murmur at the right upper sternal border, no diastolic murmur, absent A2.  Abdomen is soft and nontender with no organomegaly, extremities have no edema.  Skin is warm and dry with no rash.  The patient presents with a clear history of progressive exertional shortness of breath and fatigue consistent with NYHA functional class III heart failure with preserved ejection fraction.  His echo is reviewed and shows LVEF of 60 to 65%, normal RV function, mild mitral regurgitation, and severe aortic stenosis with a mean gradient of 42 mmHg and calculated valve area of 0.88 cm.  Cardiac catheterization is pertinent for normal right heart hemodynamics and patent coronary stents in the right coronary artery.  The LAD stent is patent with moderate 50% in-stent restenosis and there is less than 50% stenosis in the distal circumflex.  There are no high-grade lesions present.  CT angiography studies show suitable access for transfemoral TAVR and a 4 cm ascending aorta, stable interstitial changes in the lungs bilaterally, and no other pertinent noncardiac findings.  Gated CTA of the heart shows a tricuspid aortic valve with severely reduced cusp excursion, aortic valve calcium score of 3744, and an annular area of 574 mm, suitable for a 29 mm Edwards SAPIEN 3 valve.  I have reviewed the natural history of severe, symptomatic aortic stenosis with the patient and his family members today.  We discussed potential treatment options including palliative medical therapy, surgical aortic valve replacement, and TAVR.  Considering his age greater than 88 years old, and comorbidities outlined above,  I feel like TAVR would be an appropriate treatment modality.  We discussed the TAVR procedure, expected recovery, typical length of stay in the hospital, and potential risks.  He understands the risks of serious/life-threatening complications occur at a low frequency of no more than 1 to 2% and that the risk of high-grade AV block and need for permanent pacemaker occurs approximately 6% of the time.  The patient will require formal cardiac surgical consultation and this is arranged to be done as an outpatient tomorrow with Dr. Laneta Simmers.  As long as he is in agreement, we will tentatively plan to proceed with TAVR next week.  Tonny Bollman, M.D. 06/03/2023 6:50 PM

## 2023-06-03 NOTE — Progress Notes (Addendum)
 Patient Name: Kenneth [name] Date of Encounter: 06/03/2023 [facility] Cardiologist: Bryan Lemma, MD   Interval Summary  .    No complaints this morning, did have some brief chest pain early morning.   Vital Signs .    Vitals:   06/03/23 0400 06/03/23 0500 06/03/23 0600 06/03/23 0700  BP: 134/78 (!) 144/75 (!) 145/72 (!) 141/76  Pulse: 66 60 65 70  Resp: 18 17 (!) 21 20  Temp:   98.1 F (36.7 C) 98 F (36.7 C)  TempSrc:   Oral   SpO2: 100% 100% 100% 100%   No intake or output data in the 24 hours ending 06/03/23 0756    05/27/2023    9:33 AM 05/14/2023    9:24 AM 03/29/2023    9:10 PM  Last 3 Weights  Weight (lbs) 200 lb 206 lb 12.8 oz 210 lb 5.1 oz  Weight (kg) 90.719 kg 93.804 kg 95.4 kg      Telemetry/ECG    Sinus Rhythm, brief run of atrial tachycardia - Personally Reviewed  Physical Exam .    GEN: No acute distress.   Neck: No JVD Cardiac: RRR, 4/6 systolic murmur, no rubs, or gallops.  Respiratory: Clear to auscultation bilaterally. GI: Soft, nontender, non-distended  MS: No edema  Assessment & Plan .     78 y.o. male with a hx of atrial flutter s/p ablation 2006, afib ablation 01/2014, CAD, severe AS, interstitial lung disease followed by Dr. Vassie Loll, HTN, HLD and DM II who was seen 06/02/2023 for the evaluation of SOB at the request of Dr. Karene Fry.  Dyspnea -- presented with shortness of breath, weakness, fatigue.  O2 sat 100% on room air, not volume overloaded on exam.  Normal wedge pressure on recent cath a week ago. -- Eliquis was held in the setting of cardiac cath, though low suspicion for PE -- Chest x-ray negative, BNP 47 -- Suspect symptoms are being driven by his severe aortic stenosis.  He did have upcoming appointment with Dr. Excell Seltzer in March for further discussion. Discussed with structural team, they will evaluate inpatient.  -Suggest that in the setting of chronic lung disease and CAD, he simply does not have the ability to  compensate but for now provide progressively worsening aortic stenosis.  Chest pain CAD -- Cardiac catheterization 2/18 with 50% mid LAD in-stent restenosis, 50% stenosis and sidebranch in D2, 45% distal circumflex lesion, 50% RPDA lesion.  Recommendations were to continue workup for TAVR if still symptomatic could consider ischemic evaluation of LAD using Myoview stress test. -- High-sensitivity troponin 9>> 9 -- No aspirin in the setting of need for Eliquis, continue statin => Troponin negative, unlikely that his LAD is causing the symptoms, more likely consistent with angina from severe AS.  Severe aortic stenosis --Echocardiogram 2/5 with LVEF of 60 to 65%, mild concentric LVH, grade 2 diastolic dysfunction, normal RV, normal PA pressure, mild MR, severe aortic valve stenosis, mean gradient of 42 mmHg. CT Chest-Aorta Morph for TAVR workup shows dilated aorta at 4 cm, coronary convocation, emphysema with stable ILD.  Apley calcified aortic valve without AV CAC of 3744 (very significant).  Appears to be suitable for 29 mm SAPIEN valve. -- Undergoing TAVR workup as noted above => Has been seen by the TAVR team.  Will be seen tonight by Dr. Excell Seltzer, and as it stands is scheduled to see Dr. Laneta Simmers tomorrow in clinic for the surgical consultation with plans for working TAVR next Tuesday, March 4.  Atrial fibrillation/flutter -- Remains in sinus rhythm -- on eliquis  IPF -- follows with pulmonary -- on prednisone 5mg  daily   HTN -- Controlled  HLD -- On statin  For questions or updates, please contact [facility] Please consult [email or web address] for contact info under        Signed, Laverda Page, NP    ATTENDING ATTESTATION  I have seen, examined and evaluated the patient this afternoon on rounds along with Laverda Page, NP after discussion with Cline Crock, PA (and Dr. Excell Seltzer) from the Structural Heart Team.  After reviewing all the available data and chart, we  discussed the patients laboratory, study & physical findings as well as symptoms in detail.  I agree with her findings, examination as well as impression recommendations as per our discussion.    Attending adjustments noted in italics.   I suspect he is now becoming significant symptomatic from his aortic stenosis.  Thankfully due to a cancellation in the schedule next Tuesday, there is a spot available for him to be worked in on Tuesday, March 4 for his TAVR.  He will have his cardiology consultation done here in the hospital with Dr. Excell Seltzer and they will be scheduled to see Dr. Laneta Simmers tomorrow.  Anticipate that he should be ready for discharge today with plans for expect moving his TAVR workup.   [facility] will sign off.   Medication Recommendations: None Other recommendations (labs, testing, etc): Per Structural Heart Team Follow up as an outpatient: Will be scheduled per Structural Heart Team.     Marykay Lex, MD, MS Bryan Lemma, M.D., M.S. Interventional Cardiologist  [facility]  Pager # [phone] Phone # [phone] [address] [place], [place] [address]

## 2023-06-03 NOTE — Progress Notes (Signed)
  Progress Note   Patient: Kenneth [name] ZOX:[id number] DOB: 04-Sep-1945 DOA: 06/02/2023     0 DOS: the patient was seen and examined on 06/03/2023   Brief hospital course: 78 y.o. male with medical history significant for type 2 diabetes mellitus, CAD, chronic HFpEF, atrial fibrillation on Eliquis, ILD, and severe aortic stenosis who presents for evaluation of chest pain and shortness of breath.   Assessment and Plan:  Atypical chest pain with dyspnea - Chest x-ray personally reviewed showing no acute opacifications.  Multifactorial dyspnea given patient's underlying interstitial lung disease, CAD, and severe aortic stenosis.  Chest pain appears to be resolved at this time.  Troponin is negative.  Just had a cath 2/18 showing stable coronary anatomy.  CT angio noting no signs of PE nor acute pneumonia, only chronic ILD.  However his dyspnea/DOE still a bit persistent.  Likely his severe aortic stenosis contributing to the majority of his symptoms.  Cardiology following closely.  Previously been recommended for TAVR earlier this month, undergoing preliminary workup by cardiology now.  At this time unclear if we will pursue TAVR while inpatient or continue with outpatient management.  Severe aortic stenosis - Echo 2/5 noting EF 60-65%, grade 2 DD, severe aortic stenosis with a mean gradient 42.  TAVR workup by structural heart team.  Atrial fibrillation/atrial flutter - Status post ablation.  Continues on Eliquis.  Monitor on telemetry.  Appears well-controlled.  Interstitial pulmonary fibrosis - Does not appear to be acutely exacerbated, no worsening hypoxia.  Continues on low-dose prednisone.  Diabetes mellitus - Insulin sliding scale on board.  Hypertension/hyperlipidemia - Will restart home medication regimen.       Subjective: Patient Sting comfortably this morning, family at bedside.  Admits that his chest pain is largely resolved.  Denies any fever, purulent sputum, nausea,  vomiting.  Admits to dry cough intermittently.  Stated that he was becoming more and more short of breath with exertion, however appears comfortable while at rest right now.  Physical Exam: Vitals:   06/03/23 0700 06/03/23 0915 06/03/23 1100 06/03/23 1149  BP: (!) 141/76 137/89 127/69   Pulse: 70 70 71   Resp: 20 (!) 22 17   Temp: 98 F (36.7 C) 98 F (36.7 C)    TempSrc:      SpO2: 100% 100% 100%   Weight:    90.9 kg  Height:    6\' 3"  (1.905 m)   GENERAL:  Alert, pleasant, no acute distress  HEENT:  EOMI CARDIOVASCULAR:  RRR, systolic murmurs appreciated RESPIRATORY: Mild right lower lobe crackles, poor air movement  GASTROINTESTINAL:  Soft, nontender, nondistended EXTREMITIES:  No LE edema bilaterally NEURO:  No new focal deficits appreciated SKIN:  No rashes noted PSYCH:  Appropriate mood and affect   Data Reviewed:  Chest x-ray personally reviewed noting no frank consolidations or opacities  Family Communication: At bedside  Disposition: Status is: Observation The patient remains OBS appropriate and will d/c before 2 midnights.  Planned Discharge Destination: Home    Time spent: 32 minutes  Author: Deanna Artis, DO 06/03/2023 12:17 PM  For on call review [email or web address].

## 2023-06-03 NOTE — Plan of Care (Signed)
  Problem: Coping: Goal: Ability to adjust to condition or change in health will improve Outcome: Progressing   Problem: Fluid Volume: Goal: Ability to maintain a balanced intake and output will improve Outcome: Not Applicable   Problem: Health Behavior/Discharge Planning: Goal: Ability to identify and utilize available resources and services will improve Outcome: Progressing Goal: Ability to manage health-related needs will improve Outcome: Progressing   Problem: Metabolic: Goal: Ability to maintain appropriate glucose levels will improve Outcome: Progressing   Problem: Nutritional: Goal: Maintenance of adequate nutrition will improve Outcome: Progressing Goal: Progress toward achieving an optimal weight will improve Outcome: Not Applicable   Problem: Skin Integrity: Goal: Risk for impaired skin integrity will decrease Outcome: Progressing   Problem: Education: Goal: Knowledge of General Education information will improve Description: Including pain rating scale, medication(s)/side effects and non-pharmacologic comfort measures Outcome: Progressing

## 2023-06-03 NOTE — Progress Notes (Signed)
 Patient and family have received discharge instructions and verbalize understanding.

## 2023-06-04 ENCOUNTER — Other Ambulatory Visit: Payer: Self-pay

## 2023-06-04 ENCOUNTER — Institutional Professional Consult (permissible substitution) (INDEPENDENT_AMBULATORY_CARE_PROVIDER_SITE_OTHER): Payer: Medicare Other | Admitting: Surgery

## 2023-06-04 ENCOUNTER — Inpatient Hospital Stay (HOSPITAL_COMMUNITY)
Admission: AD | Admit: 2023-06-04 | Discharge: 2023-06-11 | DRG: 267 | Disposition: A | Discharge status: Home / Self Care | Payer: Medicare Other | Source: Ambulatory Visit | Attending: Cardiology | Admitting: Cardiology

## 2023-06-04 ENCOUNTER — Inpatient Hospital Stay (HOSPITAL_COMMUNITY): Payer: Medicare Other

## 2023-06-04 ENCOUNTER — Encounter: Payer: Self-pay | Admitting: Surgery

## 2023-06-04 ENCOUNTER — Encounter (HOSPITAL_COMMUNITY): Payer: Self-pay

## 2023-06-04 ENCOUNTER — Encounter (HOSPITAL_COMMUNITY): Payer: Self-pay | Admitting: Cardiovascular Disease

## 2023-06-04 VITALS — BP 143/88 | HR 83 | Resp 20 | Ht 75.0 in | Wt 204.0 lb

## 2023-06-04 DIAGNOSIS — Z7984 Long term (current) use of oral hypoglycemic drugs: Secondary | ICD-10-CM | POA: Diagnosis not present

## 2023-06-04 DIAGNOSIS — R0989 Other specified symptoms and signs involving the circulatory and respiratory systems: Secondary | ICD-10-CM | POA: Diagnosis not present

## 2023-06-04 DIAGNOSIS — E1169 Type 2 diabetes mellitus with other specified complication: Secondary | ICD-10-CM | POA: Diagnosis present

## 2023-06-04 DIAGNOSIS — R001 Bradycardia, unspecified: Secondary | ICD-10-CM | POA: Diagnosis not present

## 2023-06-04 DIAGNOSIS — I25119 Atherosclerotic heart disease of native coronary artery with unspecified angina pectoris: Secondary | ICD-10-CM | POA: Diagnosis present

## 2023-06-04 DIAGNOSIS — Z860101 Personal history of adenomatous and serrated colon polyps: Secondary | ICD-10-CM

## 2023-06-04 DIAGNOSIS — Z888 Allergy status to other drugs, medicaments and biological substances status: Secondary | ICD-10-CM

## 2023-06-04 DIAGNOSIS — Z48812 Encounter for surgical aftercare following surgery on the circulatory system: Secondary | ICD-10-CM | POA: Diagnosis not present

## 2023-06-04 DIAGNOSIS — I110 Hypertensive heart disease with heart failure: Secondary | ICD-10-CM | POA: Diagnosis present

## 2023-06-04 DIAGNOSIS — Z885 Allergy status to narcotic agent status: Secondary | ICD-10-CM

## 2023-06-04 DIAGNOSIS — K219 Gastro-esophageal reflux disease without esophagitis: Secondary | ICD-10-CM | POA: Diagnosis present

## 2023-06-04 DIAGNOSIS — E785 Hyperlipidemia, unspecified: Secondary | ICD-10-CM | POA: Diagnosis present

## 2023-06-04 DIAGNOSIS — I493 Ventricular premature depolarization: Secondary | ICD-10-CM | POA: Diagnosis not present

## 2023-06-04 DIAGNOSIS — Z006 Encounter for examination for normal comparison and control in clinical research program: Secondary | ICD-10-CM | POA: Diagnosis not present

## 2023-06-04 DIAGNOSIS — I5032 Chronic diastolic (congestive) heart failure: Secondary | ICD-10-CM | POA: Diagnosis present

## 2023-06-04 DIAGNOSIS — I10 Essential (primary) hypertension: Secondary | ICD-10-CM | POA: Diagnosis not present

## 2023-06-04 DIAGNOSIS — I4892 Unspecified atrial flutter: Secondary | ICD-10-CM | POA: Diagnosis present

## 2023-06-04 DIAGNOSIS — Z803 Family history of malignant neoplasm of breast: Secondary | ICD-10-CM

## 2023-06-04 DIAGNOSIS — I2510 Atherosclerotic heart disease of native coronary artery without angina pectoris: Secondary | ICD-10-CM | POA: Diagnosis present

## 2023-06-04 DIAGNOSIS — I25758 Atherosclerosis of native coronary artery of transplanted heart with other forms of angina pectoris: Secondary | ICD-10-CM

## 2023-06-04 DIAGNOSIS — Y712 Prosthetic and other implants, materials and accessory cardiovascular devices associated with adverse incidents: Secondary | ICD-10-CM | POA: Diagnosis present

## 2023-06-04 DIAGNOSIS — R55 Syncope and collapse: Secondary | ICD-10-CM | POA: Diagnosis not present

## 2023-06-04 DIAGNOSIS — I440 Atrioventricular block, first degree: Secondary | ICD-10-CM | POA: Diagnosis not present

## 2023-06-04 DIAGNOSIS — Z79899 Other long term (current) drug therapy: Secondary | ICD-10-CM

## 2023-06-04 DIAGNOSIS — I350 Nonrheumatic aortic (valve) stenosis: Secondary | ICD-10-CM

## 2023-06-04 DIAGNOSIS — Z87891 Personal history of nicotine dependence: Secondary | ICD-10-CM

## 2023-06-04 DIAGNOSIS — Z01818 Encounter for other preprocedural examination: Secondary | ICD-10-CM | POA: Diagnosis not present

## 2023-06-04 DIAGNOSIS — R54 Age-related physical debility: Secondary | ICD-10-CM | POA: Diagnosis present

## 2023-06-04 DIAGNOSIS — F419 Anxiety disorder, unspecified: Secondary | ICD-10-CM | POA: Diagnosis present

## 2023-06-04 DIAGNOSIS — I480 Paroxysmal atrial fibrillation: Secondary | ICD-10-CM | POA: Diagnosis present

## 2023-06-04 DIAGNOSIS — R0609 Other forms of dyspnea: Secondary | ICD-10-CM

## 2023-06-04 DIAGNOSIS — Z800 Family history of malignant neoplasm of digestive organs: Secondary | ICD-10-CM

## 2023-06-04 DIAGNOSIS — D6869 Other thrombophilia: Secondary | ICD-10-CM | POA: Diagnosis present

## 2023-06-04 DIAGNOSIS — L400 Psoriasis vulgaris: Secondary | ICD-10-CM | POA: Diagnosis present

## 2023-06-04 DIAGNOSIS — Z955 Presence of coronary angioplasty implant and graft: Secondary | ICD-10-CM | POA: Diagnosis not present

## 2023-06-04 DIAGNOSIS — R0602 Shortness of breath: Secondary | ICD-10-CM | POA: Diagnosis not present

## 2023-06-04 DIAGNOSIS — Z8249 Family history of ischemic heart disease and other diseases of the circulatory system: Secondary | ICD-10-CM

## 2023-06-04 DIAGNOSIS — Z85828 Personal history of other malignant neoplasm of skin: Secondary | ICD-10-CM

## 2023-06-04 DIAGNOSIS — Z7901 Long term (current) use of anticoagulants: Secondary | ICD-10-CM | POA: Diagnosis not present

## 2023-06-04 DIAGNOSIS — Z952 Presence of prosthetic heart valve: Secondary | ICD-10-CM | POA: Diagnosis not present

## 2023-06-04 DIAGNOSIS — Z823 Family history of stroke: Secondary | ICD-10-CM

## 2023-06-04 DIAGNOSIS — J849 Interstitial pulmonary disease, unspecified: Secondary | ICD-10-CM | POA: Diagnosis present

## 2023-06-04 DIAGNOSIS — T82855A Stenosis of coronary artery stent, initial encounter: Secondary | ICD-10-CM | POA: Diagnosis present

## 2023-06-04 DIAGNOSIS — Z87442 Personal history of urinary calculi: Secondary | ICD-10-CM

## 2023-06-04 DIAGNOSIS — I5033 Acute on chronic diastolic (congestive) heart failure: Secondary | ICD-10-CM | POA: Diagnosis not present

## 2023-06-04 DIAGNOSIS — I77810 Thoracic aortic ectasia: Secondary | ICD-10-CM | POA: Diagnosis present

## 2023-06-04 DIAGNOSIS — Z7952 Long term (current) use of systemic steroids: Secondary | ICD-10-CM

## 2023-06-04 DIAGNOSIS — G4733 Obstructive sleep apnea (adult) (pediatric): Secondary | ICD-10-CM | POA: Diagnosis not present

## 2023-06-04 DIAGNOSIS — Z87820 Personal history of traumatic brain injury: Secondary | ICD-10-CM

## 2023-06-04 LAB — COMPREHENSIVE METABOLIC PANEL
ALT: 21 U/L (ref 0–44)
AST: 25 U/L (ref 15–41)
Albumin: 3.8 g/dL (ref 3.5–5.0)
Alkaline Phosphatase: 50 U/L (ref 38–126)
Anion gap: 8 (ref 5–15)
BUN: 15 mg/dL (ref 8–23)
CO2: 24 mmol/L (ref 22–32)
Calcium: 8.9 mg/dL (ref 8.9–10.3)
Chloride: 107 mmol/L (ref 98–111)
Creatinine, Ser: 1.31 mg/dL — ABNORMAL HIGH (ref 0.61–1.24)
GFR, Estimated: 56 mL/min — ABNORMAL LOW (ref >60)
Glucose, Bld: 148 mg/dL — ABNORMAL HIGH (ref 70–99)
Potassium: 4.5 mmol/L (ref 3.5–5.1)
Sodium: 139 mmol/L (ref 135–145)
Total Bilirubin: 0.6 mg/dL (ref 0.0–1.2)
Total Protein: 6.6 g/dL (ref 6.5–8.1)

## 2023-06-04 LAB — CBC WITH DIFFERENTIAL/PLATELET
Abs Immature Granulocytes: 0.03 10*3/uL (ref 0.00–0.07)
Basophils Absolute: 0 10*3/uL (ref 0.0–0.1)
Basophils Relative: 1 %
Eosinophils Absolute: 0.1 10*3/uL (ref 0.0–0.5)
Eosinophils Relative: 1 %
HCT: 38.4 % — ABNORMAL LOW (ref 39.0–52.0)
Hemoglobin: 12.6 g/dL — ABNORMAL LOW (ref 13.0–17.0)
Immature Granulocytes: 0 %
Lymphocytes Relative: 22 %
Lymphs Abs: 1.6 10*3/uL (ref 0.7–4.0)
MCH: 29 pg (ref 26.0–34.0)
MCHC: 32.8 g/dL (ref 30.0–36.0)
MCV: 88.3 fL (ref 80.0–100.0)
Monocytes Absolute: 0.6 10*3/uL (ref 0.1–1.0)
Monocytes Relative: 8 %
Neutro Abs: 4.9 10*3/uL (ref 1.7–7.7)
Neutrophils Relative %: 68 %
Platelets: 181 10*3/uL (ref 150–400)
RBC: 4.35 MIL/uL (ref 4.22–5.81)
RDW: 13.5 % (ref 11.5–15.5)
WBC: 7.2 10*3/uL (ref 4.0–10.5)
nRBC: 0 % (ref 0.0–0.2)

## 2023-06-04 LAB — BRAIN NATRIURETIC PEPTIDE: B Natriuretic Peptide: 43.2 pg/mL (ref 0.0–100.0)

## 2023-06-04 LAB — MRSA NEXT GEN BY PCR, NASAL: MRSA by PCR Next Gen: NOT DETECTED

## 2023-06-04 MED ORDER — INSULIN ASPART 100 UNIT/ML IJ SOLN
0.0000 [IU] | Freq: Three times a day (TID) | INTRAMUSCULAR | Status: DC
  Administered 2023-06-05 – 2023-06-09 (×5): 2 [IU] via SUBCUTANEOUS
  Administered 2023-06-09: 3 [IU] via SUBCUTANEOUS
  Administered 2023-06-10: 2 [IU] via SUBCUTANEOUS

## 2023-06-04 MED ORDER — MEMANTINE HCL 10 MG PO TABS
20.0000 mg | ORAL_TABLET | Freq: Every day | ORAL | Status: DC
  Administered 2023-06-04 – 2023-06-10 (×7): 20 mg via ORAL
  Filled 2023-06-04 (×8): qty 2

## 2023-06-04 MED ORDER — PREDNISONE 20 MG PO TABS
20.0000 mg | ORAL_TABLET | Freq: Every day | ORAL | Status: DC
  Administered 2023-06-05: 20 mg via ORAL
  Filled 2023-06-04: qty 1

## 2023-06-04 MED ORDER — OMEPRAZOLE MAGNESIUM 20 MG PO TBEC: 20.0000 mg | DELAYED_RELEASE_TABLET | Freq: Every day | ORAL | Status: DC | PRN

## 2023-06-04 MED ORDER — FUROSEMIDE 20 MG PO TABS
20.0000 mg | ORAL_TABLET | ORAL | Status: DC
  Administered 2023-06-05 – 2023-06-09 (×2): 20 mg via ORAL
  Filled 2023-06-04 (×3): qty 1

## 2023-06-04 MED ORDER — PANTOPRAZOLE SODIUM 20 MG PO TBEC: 20.0000 mg | DELAYED_RELEASE_TABLET | Freq: Every day | ORAL | Status: DC | PRN

## 2023-06-04 MED ORDER — ACETAMINOPHEN 325 MG PO TABS: 650.0000 mg | ORAL_TABLET | ORAL | Status: DC | PRN

## 2023-06-04 MED ORDER — NITROGLYCERIN 0.4 MG SL SUBL
0.4000 mg | SUBLINGUAL_TABLET | SUBLINGUAL | Status: AC | PRN
Start: 2023-06-04 — End: ?

## 2023-06-04 MED ORDER — ORAL CARE MOUTH RINSE: 15.0000 mL | OROMUCOSAL | Status: DC | PRN

## 2023-06-04 MED ORDER — RANOLAZINE ER 500 MG PO TB12
500.0000 mg | ORAL_TABLET | Freq: Two times a day (BID) | ORAL | Status: DC
  Administered 2023-06-04 – 2023-06-11 (×14): 500 mg via ORAL
  Filled 2023-06-04 (×15): qty 1

## 2023-06-04 MED ORDER — FERROUS SULFATE 325 (65 FE) MG PO TABS
325.0000 mg | ORAL_TABLET | ORAL | Status: DC
Start: 2023-06-06 — End: 2023-06-11
  Administered 2023-06-06 – 2023-06-11 (×3): 325 mg via ORAL
  Filled 2023-06-04 (×5): qty 1

## 2023-06-04 MED ORDER — APIXABAN 5 MG PO TABS
5.0000 mg | ORAL_TABLET | Freq: Two times a day (BID) | ORAL | Status: DC
Start: 2023-06-04 — End: 2023-06-08
  Administered 2023-06-04: 5 mg via ORAL
  Filled 2023-06-04 (×2): qty 1

## 2023-06-04 MED ORDER — ROSUVASTATIN CALCIUM 20 MG PO TABS
40.0000 mg | ORAL_TABLET | Freq: Every day | ORAL | Status: DC
  Administered 2023-06-04 – 2023-06-10 (×7): 40 mg via ORAL
  Filled 2023-06-04 (×8): qty 2

## 2023-06-04 MED ORDER — ONDANSETRON HCL 4 MG/2ML IJ SOLN
4.0000 mg | Freq: Four times a day (QID) | INTRAMUSCULAR | Status: DC | PRN
  Administered 2023-06-07: 4 mg via INTRAVENOUS
  Filled 2023-06-04: qty 2

## 2023-06-04 MED ORDER — LORATADINE 10 MG PO TABS
10.0000 mg | ORAL_TABLET | Freq: Every day | ORAL | Status: DC
  Administered 2023-06-05 – 2023-06-11 (×7): 10 mg via ORAL
  Filled 2023-06-04 (×7): qty 1

## 2023-06-04 MED ORDER — GALANTAMINE HYDROBROMIDE ER 8 MG PO CP24
24.0000 mg | ORAL_CAPSULE | Freq: Every morning | ORAL | Status: AC
Start: 2023-06-05 — End: ?
  Administered 2023-06-05 – 2023-06-11 (×6): 24 mg via ORAL
  Filled 2023-06-04 (×7): qty 3

## 2023-06-04 MED ORDER — CHLORHEXIDINE GLUCONATE CLOTH 2 % EX PADS
6.0000 | MEDICATED_PAD | Freq: Every day | CUTANEOUS | Status: DC
  Administered 2023-06-05 – 2023-06-07 (×3): 6 via TOPICAL

## 2023-06-04 MED ORDER — DORZOLAMIDE HCL-TIMOLOL MAL 2-0.5 % OP SOLN
1.0000 [drp] | Freq: Every day | OPHTHALMIC | Status: DC
  Administered 2023-06-04 – 2023-06-10 (×7): 1 [drp] via OPHTHALMIC
  Filled 2023-06-04: qty 10

## 2023-06-04 NOTE — H&P (View-Only) (Signed)
 Patient ID: Kenneth [name], male   DOB: 24-Jan-1946, 78 y.o.   MRN: [id number]  [facility]       [address]       [name] [address]             [phone]          CARDIOTHORACIC SURGERY CONSULTATION REPORT  PCP is [facility] Referring Provider is Tonny Bollman, MD Primary Cardiologist is Lucretia Pendley Lemma, MD  Reason for consultation:  Severe aortic stenosis  HPI:  The patient is a 78 year old gentleman with a history of coronary artery disease status post multiple PCI's, paroxysmal atrial flutter status post ablation in 2006 in 2015, severe aortic stenosis, hypertension, hyperlipidemia, type 2 diabetes, and interstitial lung disease on chronic low-dose prednisone who was referred for consideration of TAVR.  An echocardiogram on 05/14/2023 showed severe aortic stenosis with a trileaflet severely calcified aortic valve with a mean gradient of 42 mmHg and a valve area by VTI of 0.88 cm.  There is mild ascending aortic dilation at 4.1 cm.  There is mild mitral regurgitation and normal left ventricular ejection fraction of 60 to 65%.  There is mild concentric LVH with grade 2 diastolic dysfunction.  He subsequently underwent cardiac catheterization on 05/27/2023 showing about 50% in-stent restenosis within the LAD.  There is 55% stenosis of the second diagonal branch.  There was 45% distal left circumflex stenosis.  The previously placed mid RCA stent was widely patent.  There was 50% stenosis in the PDA.  Right heart pressures were fairly normal with a PA pressure of 28/14 and a wedge pressure of 10.  He was discharged home after his catheterization.  He and his wife report continued episodes of exertional shortness of breath and left-sided chest discomfort.  He presented to the [facility] ED on 06/02/2023 for evaluation.  There is no evidence of heart failure or acute ischemia.  Electrocardiogram is normal.  Chest  x-ray was normal.  BNP and high-sensitivity troponin were normal.  His oxygen saturations on room air 100%.  His symptoms were felt to be related to his severe aortic stenosis and he was seen by Dr. Excell Seltzer.  He was felt to be doing well yesterday after completion of his workup and being seen by Dr. Excell Seltzer and was discharged home with plans for TAVR next Tuesday.  He returned today to see me in the office for surgical evaluation and was noted to be very short of breath on presentation and had difficulty walking from the front door back to the room.  After sitting down for a while he improved enough to talk but remained fatigued and short of breath.  He reports having some left-sided chest discomfort.  He has had occasional episodes of dizziness at home with positional change but no syncope.  He reports some orthopnea but no peripheral edema.  Past Medical History:  Diagnosis Date   Arthritis    CAD S/P percutaneous coronary angioplasty 06/28/2016   Cardiologist- Dr. Herbie Baltimore: 06/28/16 -- [facility] PCI (Synergy DES 2.75 x 20 mm); 07/10/16: Staged p-m LAD PCI (FFR 0.75) - DES PCI (Synergy DES 3.5 x 20 mm); 10/11/2020: mid-distal LAD 70% (FFR 0.75) DES PCI (Synergy DES 2.5 mm x 32 mm - overlaps prox-mid stent, tapered post-dilation 3.7 - 2.6 mm.;  06/2021 - Cath with severe LAD-D2 bifurcation ISR -- PTCA / kissing balloon - reduced LAD to ~25% & D2 to 40%  Chronic dryness of both eyes    Chronic heart failure with preserved ejection fraction (HFpEF) (HCC) 05/22/2013   Diverticulosis of colon    Epiretinal membrane    "epiretinal attachment" (06/28/2016) 10-14-2017 per pt oringinally in both , now only unilateral    GERD (gastroesophageal reflux disease)    Hiatal hernia    History of adenomatous polyp of colon    History of concussion 08/23/2017   w/o loc--- per pt no residual   History of kidney stones    History of rheumatic fever 1958   NO Evidence of Vavlular disease -> only mild Aortic Sclerosis   History  of skin cancer    excision leg;  froze the left arm--- unsure BCC or SCC    Hyperlipidemia    Hypertension    ILD (interstitial lung disease) (HCC)    pulmologist-  dr Vassie Loll--  secondary to methotrexate use --- last PFTs 04-25-2015  mild restriction   Iron deficiency anemia    Mild obstructive sleep apnea    10-04-2019 per pt  had a sleep study years ago , was told no cpap recommended pt denies    Nephrolithiasis    CT 09-07-2019 bilateral nonobstructive stones   Paroxysmal atrial fibrillation (HCC) 2015   a. 11/2003 Tikosyn initiated - subsequently d/c'd;  b. 2006 RFCA for Afib @ [facility];  c. 09/2011 Echo: EF 60-65%, Gr 2 DD;  d. DCCV 10/2011 , 04/2012, 12/ 2014, & 02/ 2015;  e. RFCA 01-13-2014   Plaque psoriasis 1969   [facility] rheumatologist  and [facility] [place]   PONV (postoperative nausea and vomiting)    Right ureteral stone    S/P drug eluting coronary stent placement    06-28-2016  x1 to mRCA;  07-10-2016 x1 to mLAD   Scarring of lung    Seasonal allergies    Short-term memory loss    Type 2 diabetes mellitus (HCC)    followed by pcp   Ureteral calculus, right    Wears hearing aid in both ears     Past Surgical History:  Procedure Laterality Date   APPENDECTOMY  1996   ATRIAL FIBRILLATION ABLATION N/A 01/13/2014   repeat PVI, also left atrial ablation performed with successfull ablation of LA flutter by Dr Johney Frame   ATRIAL FIBRILLATION ABLATION  08/2004   Dr Delena Serve [facility])   CARDIAC STRESS PET  03/12/2022   Normal perfusion study/low risk.  Normal LVEF and normal LVEF preserved.  Suspect reduced myocardial blood flow reserve related to microvascular disease.  No evidence of infarction or ischemia.  Resting EF 48%.  Stress EF 54%.  Mildly enlarged LV. Asc Ao 4.1 cm--stable.  Also stable mosaic attenuation in the lungs no change.   CARDIOVERSION N/A 05/22/2013   Procedure: CARDIOVERSION;  Surgeon: Hillis Range, MD;  Location: [facility] OR;   Service: Cardiovascular;  Laterality: N/A;   CARDIOVERSION N/A 10/23/2011   Procedure: CARDIOVERSION;  Surgeon: Duke Salvia, MD;  Location: [facility] CATH LAB;  Service: Cardiovascular;  Laterality: N/A;   CARDIOVERSION N/A 05/01/2012   Procedure: CARDIOVERSION;  Surgeon: Duke Salvia, MD;  Location: [facility] CATH LAB;  Service: Cardiovascular;  Laterality: N/A;   CARPAL TUNNEL RELEASE Right 1980s   CATARACT EXTRACTION W/ INTRAOCULAR LENS  IMPLANT, BILATERAL  2012  approx.   CORONARY BALLOON ANGIOPLASTY N/A 07/04/2021   Procedure: CORONARY BALLOON ANGIOPLASTY;  Surgeon: Marykay Lex, MD;  Location: [facility] INVASIVE CV LAB;  Service: Cardiovascular;; scoring  balloon and an Whitewright balloon PTCA of LAD; with kissing balloon PTCA of LAD-D2 bifurcation stented segment reducing LAD to 20 % ISR and ostial D2 to 40%.   CORONARY PRESSURE/FFR STUDY N/A 06/28/2016   Procedure: Intravascular Pressure Wire/FFR Study;  Surgeon: Marykay Lex, MD;  Location: [facility] INVASIVE CV LAB;  Service: Cardiovascular: FFR of mLAD ~65% lesion = 0.75 post --> Staged PCI   CORONARY PRESSURE/FFR STUDY N/A 10/11/2020   Procedure: INTRAVASCULAR PRESSURE WIRE/FFR STUDY;  Surgeon: Marykay Lex, MD;  Location: [facility] INVASIVE CV LAB;  Service: Cardiovascular;  Laterality: LAD mid-distal just after D2 --> FFR 0.75 SIGNIFICANT => PCI   CORONARY STENT INTERVENTION N/A 06/28/2016   Procedure: Coronary Stent Intervention;  Surgeon: Marykay Lex, MD;  Location: [facility] INVASIVE CV LAB;  Service: Cardiovascular;  Laterality: mRCA PCI -Synergy DES 2.75 m x 20 mm (3.1 mm)   CORONARY STENT INTERVENTION N/A 07/10/2016   Procedure: Coronary Stent Intervention;  Surgeon: Marykay Lex, MD;  Location: [facility] INVASIVE CV LAB;  Service: Cardiovascular: mLAD PCI Synergy DES 3.5 mm x 20 mm)   CORONARY STENT INTERVENTION N/A 10/11/2020   Procedure: CORONARY STENT INTERVENTION;  Surgeon: Marykay Lex, MD;  Location: [facility] INVASIVE CV LAB;  Service: Cardiovascular:  mid-distal LAD ~70% (FFR 0.75): SYNERGY DES 2.5 MM X 32 MM (overlaps prox-mid Stent from 07/2016) - Tapered post-dilation 3.7 mm ->2.6 mm; Jailed D2 PTCA reduced 60% to 40%.   CYSTOSCOPY WITH RETROGRADE PYELOGRAM, URETEROSCOPY AND STENT PLACEMENT Right 10/16/2017   Procedure: CYSTOSCOPY WITH RIGHT RETROGRADE RIGHT URETEROSCOPY AND STONE BASKET EXTRACTION;  Surgeon: Bjorn Pippin, MD;  Location: [facility];  Service: Urology;  Laterality: Right;   CYSTOSCOPY WITH RETROGRADE PYELOGRAM, URETEROSCOPY AND STENT PLACEMENT Right 10/08/2019   Procedure: CYSTOSCOPY WITH URETEROSCOPY; RETROGRADE PYELOGRAM; STONE BASKET EXTRACTION;  STENT PLACEMENT;  Surgeon: Malen Gauze, MD;  Location: [facility];  Service: Urology;  Laterality: Right;  1 HR   CYSTOSCOPY WITH RETROGRADE PYELOGRAM, URETEROSCOPY AND STENT PLACEMENT Right 03/28/2020   Procedure: CYSTOSCOPY WITH RIGHT RETROGRADE PYELOGRAM, URETEROSCOPY WITH HOLMIUM LASER AND STENT PLACEMENT;  Surgeon: Bjorn Pippin, MD;  Location: [facility];  Service: Urology;  Laterality: Right;   CYSTOSCOPY WITH RETROGRADE PYELOGRAM, URETEROSCOPY AND STENT PLACEMENT Left 10/22/2021   Procedure: CYSTOSCOPY WITH LEFT RETROGRADE URETEROSCOPY WIHT HOLMIUM LASERAND STENT PLACEMENT;  Surgeon: Bjorn Pippin, MD;  Location: [facility];  Service: Urology;  Laterality: Left;   CYSTOSCOPY/RETROGRADE/URETEROSCOPY/STONE EXTRACTION WITH BASKET Right 10/27/2017   Procedure: CYSTOSCOPY/RETROGRADE/URETEROSCOPY/STONE EXTRACTION WITH BASKET/URETERAL STENT PLACEMENT and laser;  Surgeon: Bjorn Pippin, MD;  Location: [facility];  Service: Urology;  Laterality: Right;   CYSTOSCOPY/URETEROSCOPY/HOLMIUM LASER/STENT PLACEMENT Right 01/03/2023   Procedure: CYSTOSCOPY RIGHT RETROGRADE URETEROSCOPY/HOLMIUM LASER/STENT PLACEMENT;  Surgeon: Bjorn Pippin, MD;  Location: [facility];  Service: Urology;  Laterality: Right;   EXTRACORPOREAL SHOCK WAVE LITHOTRIPSY Left 08/10/2020   Procedure:  EXTRACORPOREAL SHOCK WAVE LITHOTRIPSY (ESWL);  Surgeon: Crist Fat, MD;  Location: [facility];  Service: Urology;  Laterality: Left;   EXTRACORPOREAL SHOCK WAVE LITHOTRIPSY Right 12/16/2022   Procedure: RIGHT EXTRACORPOREAL SHOCK WAVE LITHOTRIPSY (ESWL);  Surgeon: Bjorn Pippin, MD;  Location: [facility];  Service: Urology;  Laterality: Right;   HOLMIUM LASER APPLICATION Right 10/27/2017   Procedure: POSSIBLE HOLMIUM LASER APPLICATION;  Surgeon: Bjorn Pippin, MD;  Location: [facility];  Service: Urology;  Laterality: Right;   HOLMIUM LASER APPLICATION Right 10/08/2019   Procedure: HOLMIUM LASER APPLICATION;  Surgeon: Malen Gauze,  MD;  Location: [facility];  Service: Urology;  Laterality: Right;   LEFT HEART CATH AND CORONARY ANGIOGRAPHY  06/30/2003   Dr. Riley Kill: NO Significant/Obstructive CAD; Preseved LVEF (for Recurrent Afib)   NASAL SINUS SURGERY     x 2   NM MYOVIEW LTD  02/11/2021   Lexiscan:EF 55-65%.  Normal function.  No ischemia or infarction.  No EKG changes noted.   PROXIMAL INTERPHALANGEAL FUSION (PIP) Left 01-05-2001    dr graves   correction clawtoe and extensor tendon lengthening   RIGHT/LEFT HEART CATH AND CORONARY ANGIOGRAPHY N/A 06/28/2016   Procedure: Right/Left Heart Cath and Coronary Angiography;  Surgeon: Marykay Lex, MD;  Location: [facility] INVASIVE CV LAB;  Service: Cardiovascular: mRCA 99% (TIMI 2), mLAD ~65% (FFR 0.75), OM3 55%. mild Pulm HTN. Mod LVEDP elevation. EF 55-65%.   RIGHT/LEFT HEART CATH AND CORONARY ANGIOGRAPHY N/A 10/11/2020   Procedure: RIGHT/LEFT HEART CATH AND CORONARY ANGIOGRAPHY;  Surgeon: Marykay Lex, MD;  Location: [facility] INVASIVE CV LAB;; Patent DES in mRCA & p-mLAD. CULPRIT = m-d LAD 70% (just after D1 w/ Ost 60%) FFR 0.75 => DES PCI m-dLAD & PTCA ost D1 (overlapping prior stent distally & crossing D2 w/  Ost D2 post PTCA). RIGHT HEART CATH PRESSURES,  & CO-CI ARE NORMAL. NO PULM HTN    RIGHT/LEFT HEART CATH AND CORONARY ANGIOGRAPHY N/A 07/04/2021   Procedure: RIGHT/LEFT HEART CATH AND CORONARY ANGIOGRAPHY;  Surgeon: Marykay Lex, MD;  Location: [facility] INVASIVE CV LAB;  Service: Cardiovascular;; moderately elevated LVEDP of 20 mmHg, and PCWP-28 mmHg -> mean PAP of 24 mmHg.  Moderate AS-mean gradient 33 mmHg.  (Similar to Echo 26 mmHg); borderline low BP.  CO-CI 9.2 and 4.0.. Stable CAD W/ progression of ISR of LAD stent at D2 => PCI   RIGHT/LEFT HEART CATH AND CORONARY ANGIOGRAPHY N/A 05/27/2023   Procedure: RIGHT/LEFT HEART CATH AND CORONARY ANGIOGRAPHY;  Surgeon: Marykay Lex, MD;  Location: [facility] INVASIVE CV LAB;  Service: Cardiovascular;  Laterality: N/A;   ROTATOR CUFF REPAIR Bilateral last one 2014   TONSILLECTOMY  age 68   TRANSTHORACIC ECHOCARDIOGRAM  09/29/2020   EF > 75%, Hyperdynamic. No RWMA. Mild LVH. ~ DD with mild LA dilation. Mild AI. Normal RV size & fxn, ? Dilated IVC c/w CVP ~ 15 mHg.   TRANSTHORACIC ECHOCARDIOGRAM  04/12/2021   EF estimated 65%.  Normal LV size and function.  No RWMA.  Mild LVH.  GRII DD.  Elevated LVEDP (c/w HFpEF).  Normal RV size.  Mildly elevated PAP estimated 36 mmHg.  Mild MR.  Mild AS (mean gradient 26 mmHg.  DI 0.32.  Aortic root 40 mm, ascending aorta 41 mm.   URETEROSCOPIC LASER LITHOTRIPSY STONE EXTRACTIONS/  STENT PLACEMENT Bilateral 09-10-2007   dr Annabell Howells  [facility]   per pt has had several prior ureteroscopic stone extraction since age 12 , last one 09-10-2007   Zio patch monitor:  02/2021   Probably Sinus Rhythm: Heart rate 56-146 bpm, average 83 bpm.  Frequent PACs noted with rare isolated PVCs.  Multiple episodes of PAT/atrial runs-longest 23 seconds average heart rate 95 bpm, fastest 5 beats at a max rate of 231 bpm.  No evidence of A-fib or atrial flutter.  No prolonged or sustained SVT/PAT.    Family History  Problem Relation Age of Onset   Coronary artery disease Mother    Breast cancer Mother    Colon cancer Mother    Heart  disease Maternal Grandmother  MI   Stroke Paternal Grandfather        CVA   Coronary artery disease Paternal Grandfather    Esophageal cancer Neg Hx    Stomach cancer Neg Hx    Rectal cancer Neg Hx     Social History   Socioeconomic History   Marital status: Married    Spouse name: Not on file   Number of children: 1   Years of education: Not on file   Highest education level: Not on file  Occupational History   Occupation: Retired Psychologist, sport and exercise  Tobacco Use   Smoking status: Former    Types: Cigars    Quit date: 09/29/1997    Years since quitting: 25.6   Smokeless tobacco: Former    Types: Snuff, Chew   Tobacco comments:    Form smoker and chew tobacco 01/25/2021  Vaping Use   Vaping status: Never Used  Substance and Sexual Activity   Alcohol use: No   Drug use: No   Sexual activity: Not on file  Other Topics Concern   Not on file  Social History Narrative   Pt lives in [place] with spouse.     Retired.    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 -> subsequently worked in [employer] (seconded to [employer]).       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        Had been very active as an Charity fundraiser"  - now doing his best to "stay out of this mess of politics" -- has declined efforts to get him to run for office.      Now is primary caregiver for his wife [name]) - who has metastatic CA & has had CVA x 2.  Pretty much "total care".  Very emotionally taxing.      He has plans to build homes for all of his children & grandchildren on their property - to ensure they all have a place to live - Not transferable   Social Drivers of Health   Financial Resource Strain: Not on file  Food Insecurity: No Food Insecurity (06/04/2023)   Hunger Vital Sign    Worried About Running Out of Food in the Last Year: Never true    Ran Out of Food in the Last Year: Never true   Transportation Needs: No Transportation Needs (06/04/2023)   PRAPARE - Administrator, Civil Service (Medical): No    Lack of Transportation (Non-Medical): No  Physical Activity: Not on file  Stress: Not on file  Social Connections: Moderately Integrated (06/04/2023)   Social Connection and Isolation Panel [NHANES]    Frequency of Communication with Friends and Family: More than three times a week    Frequency of Social Gatherings with Friends and Family: Three times a week    Attends Religious Services: 1 to 4 times per year    Active Member of Clubs or Organizations: No    Attends Banker Meetings: Never    Marital Status: Married  Catering manager Violence: Not At Risk (06/04/2023)   Humiliation, Afraid, Rape, and Kick questionnaire    Fear of Current or Ex-Partner: No    Emotionally Abused: No    Physically Abused: No    Sexually Abused: No    Prior to Admission medications   Medication Sig Start Date End Date Taking? Authorizing Provider  acetaminophen (TYLENOL) 500 MG tablet Take 1,000 mg  by mouth every 6 (six) hours as needed for mild pain (pain score 1-3) or moderate pain (pain score 4-6).   Yes [provider]  Adalimumab-bwwd (HADLIMA) 40 MG/0.8ML SOSY Inject 40 mg into the skin every 14 (fourteen) days.   Yes [provider]  apixaban (ELIQUIS) 5 MG TABS tablet Take 1 tablet (5 mg total) by mouth 2 (two) times daily. 01/12/21  Yes Fenton, Clint R, PA  Calcium Carbonate-Vit D-Min (CALCIUM 1200 PO) Take 1 tablet by mouth at bedtime.   Yes [provider]  cetirizine (ZYRTEC) 10 MG tablet Take 10 mg by mouth in the morning.   Yes [provider]  Dorzolamide HCl-Timolol Mal PF 2-0.5 % SOLN Place 1 drop into both eyes at bedtime.   Yes [provider]  empagliflozin (JARDIANCE) 25 MG TABS tablet Take 12.5 mg by mouth daily. 12/26/21  Yes [provider]  ferrous sulfate 325 (65 FE) MG tablet Take 325 mg  by mouth every Monday, Wednesday, and Friday.   Yes Ronney Asters, NP  furosemide (LASIX) 20 MG tablet Take 20 mg by mouth 2 (two) times a week. Monday and Thursday   Yes [provider]  galantamine (RAZADYNE ER) 24 MG 24 hr capsule Take 24 mg by mouth in the morning. 08/30/20  Yes [provider]  ibuprofen (ADVIL) 200 MG tablet Take 200 mg by mouth every 6 (six) hours as needed for moderate pain (pain score 4-6).   Yes [provider]  memantine (NAMENDA) 10 MG tablet Take 20 mg by mouth at bedtime. 08/30/20  Yes [provider]  metFORMIN (GLUCOPHAGE-XR) 500 MG 24 hr tablet Take 500 mg by mouth at bedtime.   Yes [provider]  Multiple Vitamins-Minerals (MULTIVITAMIN WITH MINERALS) tablet Take 1 tablet by mouth daily.   Yes [provider]  nitroGLYCERIN (NITROSTAT) 0.4 MG SL tablet Place 1 tablet (0.4 mg total) under the tongue every 5 (five) minutes as needed. 07/04/21  Yes Arty Baumgartner, NP  omeprazole (PRILOSEC OTC) 20 MG tablet Take 20 mg by mouth daily as needed (Acid reflux).   Yes [provider]  predniSONE (DELTASONE) 5 MG tablet Take 1 tablet by mouth once daily with breakfast 03/07/23  Yes Oretha Milch, MD  ranolazine (RANEXA) 500 MG 12 hr tablet Take 1 tablet (500 mg total) by mouth 2 (two) times daily. 11/11/22  Yes Marykay Lex, MD  rosuvastatin (CRESTOR) 40 MG tablet Take 1 tablet (40 mg total) by mouth daily. Patient taking differently: Take 40 mg by mouth at bedtime. 12/28/21  Yes Marykay Lex, MD  tamsulosin (FLOMAX) 0.4 MG CAPS capsule Take 1 capsule by mouth once daily Patient taking differently: Take 0.4 mg by mouth daily as needed (kidney stones). 03/13/21  Yes McKenzie, Mardene Celeste, MD  hydrOXYzine (ATARAX) 10 MG tablet Take 10 mg by mouth at bedtime as needed for anxiety or itching. Patient not taking: Reported on 06/04/2023 04/21/23   [provider]  sertraline (ZOLOFT) 100 MG tablet Take  by mouth. Patient not taking: Reported on 06/04/2023 05/05/23   [provider]    No current facility-administered medications for this visit.   No current outpatient medications on file.   Facility-Administered Medications Ordered in Other Visits  Medication Dose Route Frequency Provider Last Rate Last Admin   lidocaine (cardiac) 100 mg/14ml (XYLOCAINE) 20 MG/ML injection 2%    Anesthesia Intra-op Flowers, Rokoshi T, CRNA   60 mg at 03/25/13 1617  propofol (DIPRIVAN) 10 mg/mL bolus/IV push    Anesthesia Intra-op Flowers, Rokoshi T, CRNA   90 mg at 03/25/13 1617    Allergies  Allergen Reactions   Hydrocodone Shortness Of Breath    In combination with decongestants   Other Palpitations and Other (See Comments)    ALL DECONGESTANTS - CAUSE PALPITATIONS; THROWS HEART RHYTHM OUT OF BALANCE   Oxycodone Shortness Of Breath   Sudafed [Pseudoephedrine] Palpitations   Tramadol Shortness Of Breath and Rash    flushing   Lopid [Gemfibrozil] Other (See Comments)    Dizziness        Review of Systems:   General:  decreased appetite, decreased energy, no weight gain, no weight loss, no fever  Cardiac:  + chest pain with exertion, + chest pain at rest, + SOB with minimal exertion, + some resting SOB, no PND, + orthopnea, no palpitations, no arrhythmia, no atrial fibrillation, no LE edema, + dizzy spells, no syncope  Respiratory:  + shortness of breath, no home oxygen, no productive cough, no dry cough, no bronchitis, no wheezing, no hemoptysis, no asthma, no pain with inspiration or cough, no sleep apnea, no CPAP at night  GI:   no difficulty swallowing, no reflux, no frequent heartburn, no hiatal hernia, no abdominal pain, no constipation, no diarrhea, no hematochezia, no hematemesis, no melena  GU:   no dysuria,  no frequency, no urinary tract infection, no hematuria, no enlarged prostate, no kidney stones, no kidney disease  Vascular:  no pain suggestive of claudication, n pain in  feet, ono leg cramps, no varicose veins, no DVT, no non-healing foot ulcer  Neuro:   no stroke, no TIA's, no seizures, no headaches, no temporary blindness one eye,  no slurred speech, no peripheral neuropathy, no chronic pain, no instability of gait, no memory/cognitive dysfunction  Musculoskeletal: no arthritis, no joint swelling, no myalgias, no difficulty walking, normal mobility   Skin:   no rash, no itching, no skin infections, no pressure sores or ulcerations  Psych:   no anxiety, no depression, no nervousness, no unusual recent stress  Eyes:   no blurry vision, no floaters, no recent vision changes, + wears glasses  ENT:   + hearing loss, no loose or painful teeth, no dentures, last saw dentist this year  Hematologic:  no easy bruising, no abnormal bleeding, no clotting disorder, no frequent epistaxis  Endocrine:  + diabetes, does not check CBG's at home     Physical Exam:   BP (!) 143/88   Pulse 83   Resp 20   Ht 6\' 3"  (1.905 m)   Wt 204 lb (92.5 kg)   SpO2 98% Comment: RA  BMI 25.50 kg/m   General:  Frail appearing, dyspneic  HEENT:  Unremarkable, NCAT, PERLA, EOMI  Neck:   no JVD, no bruits, no adenopathy   Chest:   clear to auscultation, symmetrical breath sounds, no wheezes, no rhonchi   CV:   RRR, 3/6 systolic murmur RSB, no diastolic murmur  Abdomen:  soft, non-tender, no masses   Extremities:  warm, well-perfused, pedal pulses palpable, no lower extremity edema  Rectal/GU  Deferred  Neuro:   Grossly non-focal and symmetrical throughout  Skin:   Clean and dry, no rashes, no breakdown  Diagnostic Tests:  Lab Results: Recent Labs    06/02/23 1815 06/03/23 0438  WBC 8.2 7.2  HGB 13.2 11.9*  HCT 40.4 35.5*  PLT 187 160   BMET:  Recent Labs    06/02/23 1815  06/03/23 0438  NA 137 138  K 4.2 3.7  CL 105 108  CO2 21* 24  GLUCOSE 142* 116*  BUN 15 15  CREATININE 1.17 1.01  CALCIUM 9.4 8.5*    CBG (last 3)  Recent Labs    06/03/23 0722 06/03/23 1151  06/03/23 1604  GLUCAP 92 85 150*   ECHOCARDIOGRAM REPORT       Patient Name:   ALECXIS BALTZELL Date of Exam: 05/14/2023  Medical Rec #:  [id number]       Height:       75.0 in  Accession #:    [id number]      Weight:       206.8 lb  Date of Birth:  1945-08-18       BSA:          2.226 m  Patient Age:    77 years        BP:           124/80 mmHg  Patient Gender: M               HR:           70 bpm.  Exam Location:  [address]   Procedure: 2D Echo, Cardiac Doppler and Color Doppler   Indications:    I35.0 Aortic Stenosis    History:        Patient has prior history of Echocardiogram examinations,  most                 recent 04/12/2021. CAD; Risk Factors:Hypertension,  Dyslipidemia,                 Diabetes and Sleep Apnea.    Sonographer:    Sedonia Small Rodgers-Jones RDCS  Referring Phys: [id number] RAKESH V ALVA   IMPRESSIONS     1. Left ventricular ejection fraction, by estimation, is 60 to 65%. The  left ventricle has normal function. The left ventricle has no regional  wall motion abnormalities. There is mild concentric left ventricular  hypertrophy. Left ventricular diastolic  parameters are consistent with Grade II diastolic dysfunction  (pseudonormalization).   2. Right ventricular systolic function is normal. The right ventricular  size is normal. There is normal pulmonary artery systolic pressure. The  estimated right ventricular systolic pressure is 34.6 mmHg.   3. Left atrial size was moderately dilated.   4. The mitral valve is degenerative. Mild mitral valve regurgitation. No  evidence of mitral stenosis. Moderate mitral annular calcification.   5. The tricuspid valve is abnormal. Tricuspid valve regurgitation is  moderate.   6. The aortic valve is tricuspid. There is severe calcifcation of the  aortic valve. Aortic valve regurgitation is mild. Severe aortic valve  stenosis. Aortic valve area, by VTI measures 0.88 cm. Aortic valve mean  gradient measures 42.0 mmHg.    7. Aortic dilatation noted. There is mild dilatation of the ascending  aorta, measuring 41 mm.   8. The inferior vena cava is normal in size with greater than 50%  respiratory variability, suggesting right atrial pressure of 3 mmHg.   FINDINGS   Left Ventricle: Left ventricular ejection fraction, by estimation, is 60  to 65%. The left ventricle has normal function. The left ventricle has no  regional wall motion abnormalities. The left ventricular internal cavity  size was normal in size. There is   mild concentric left ventricular hypertrophy. Left ventricular diastolic  parameters are consistent with Grade II diastolic dysfunction  (pseudonormalization).  Right Ventricle: The right ventricular size is normal. No increase in  right ventricular wall thickness. Right ventricular systolic function is  normal. There is normal pulmonary artery systolic pressure. The tricuspid  regurgitant velocity is 2.81 m/s, and   with an assumed right atrial pressure of 3 mmHg, the estimated right  ventricular systolic pressure is 34.6 mmHg.   Left Atrium: Left atrial size was moderately dilated.   Right Atrium: Right atrial size was normal in size.   Pericardium: There is no evidence of pericardial effusion.   Mitral Valve: The mitral valve is degenerative in appearance. Moderate  mitral annular calcification. Mild mitral valve regurgitation. No evidence  of mitral valve stenosis.   Tricuspid Valve: The tricuspid valve is abnormal. Tricuspid valve  regurgitation is moderate.   The aortic valve is tricuspid. There is severe calcifcation of the aortic  valve. Aortic valve regurgitation is mild. Severe aortic stenosis is  present. .   Pulmonic Valve: The pulmonic valve was normal in structure. Pulmonic valve  regurgitation is not visualized.   Aorta: The aortic root is normal in size and structure and aortic  dilatation noted. There is mild dilatation of the ascending aorta,  measuring 41  mm.   Venous: The inferior vena cava is normal in size with greater than 50%  respiratory variability, suggesting right atrial pressure of 3 mmHg.   IAS/Shunts: No atrial level shunt detected by color flow Doppler.     LEFT VENTRICLE  PLAX 2D  LVIDd:         5.00 cm  LVIDs:         3.00 cm  LV PW:         1.30 cm  LV IVS:        1.30 cm  LVOT diam:     2.10 cm  LV SV:         69  LV SV Index:   31  LVOT Area:     3.46 cm     RIGHT VENTRICLE             IVC  RV Basal diam:  4.00 cm     IVC diam: 1.60 cm  RV S prime:     11.33 cm/s  TAPSE (M-mode): 1.7 cm   LEFT ATRIUM              Index        RIGHT ATRIUM           Index  LA diam:        5.30 cm  2.38 cm/m   RA Area:     17.50 cm  LA Vol (A2C):   104.0 ml 46.72 ml/m  RA Volume:   50.40 ml  22.64 ml/m  LA Vol (A4C):   77.1 ml  34.64 ml/m  LA Biplane Vol: 91.8 ml  41.24 ml/m   AORTIC VALVE  AV Area (Vmax):    0.81 cm  AV Area (Vmean):   0.78 cm  AV Area (VTI):     0.88 cm  AV Vmax:           374.20 cm/s  AV Vmean:          270.000 cm/s  AV VTI:            0.777 m  AV Peak Grad:      56.0 mmHg  AV Mean Grad:      42.0 mmHg  LVOT Vmax:  87.40 cm/s  LVOT Vmean:        60.800 cm/s  LVOT VTI:          0.198 m  LVOT/AV VTI ratio: 0.25  AI PHT:            665 msec    AORTA  Ao Root diam: 3.60 cm  Ao Asc diam:  4.10 cm   MITRAL VALVE                TRICUSPID VALVE  MV Area (PHT): 3.61 cm     TR Peak grad:   31.6 mmHg  MV Decel Time: 210 msec     TR Vmax:        281.00 cm/s  MV E velocity: 142.33 cm/s  MV A velocity: 74.27 cm/s   SHUNTS  MV E/A ratio:  1.92         Systemic VTI:  0.20 m                              Systemic Diam: 2.10 cm   Dalton McleanMD  Electronically signed by Wilfred Lacy  Signature Date/Time: 05/14/2023/1:30:56 PM    Physicians  Panel Physicians Referring Physician Case Authorizing Physician  Marykay Lex, MD (Primary)     Procedures  RIGHT/LEFT HEART CATH AND  CORONARY ANGIOGRAPHY   Conclusion      Previously placed Prox LAD to Mid LAD stent of unknown type is  widely patent the second overlapping mid LAD stent has 50% in-stent restenosis (at the previous PTCA site) with 55% stenosed side branch in 2nd Diag.   Dist Cx lesion is 45% stenosed.   Previously placed Mid RCA stent of unknown type is  widely patent.   RPDA lesion is 50% stenosed.   Normal Right Heart Cath Numbers/Pressures    Severe Aortic Stenosis by Echocardiogram (able to cross with wire but not with catheter due to tortuosity in the innominate artery. Widely patent RCA stents with left 50% ISR of the LAD stent at the previous PTCA site-Diag bifurcation with 55% ostial Diag stenosis (does not appear to be flow-limiting) here Relatively Normal Right Heart Cath Pressures and Measurements: -RAP 5 mmHg, RV P-EDP 27/2-6 mmHg; PAP-mean 28/14-22 mmHg, PCWP 10 million mercury.  -AO P-MAP 119/65-91 mmHg. -Ao sat 99%, PA sat 70%; Fick Cardiac Output 5.61-2.55.      RECOMMENDATIONS Discharge home today after bedrest.  Has plan to follow-up with Dr. Excell Seltzer in the Aortic Valve Clinic for TAVR evaluation Reassess symptoms after TAVR, if still symptomatic, could consider ischemic evaluation of the LAD with a Myoview stress test. Restart Eliquis p.m. 05/27/2023; restart Jardiance 05/28/2023; hold metformin 48 hours post cath     Jackolyn Geron Lemma, MD     Recommendations  Antiplatelet/Anticoag Recommend to resume Apixaban, at currently prescribed dose and frequency on 05/27/2023. Concurrent antiplatelet therapy not recommended.  Discharge Date In the absence of any other complications or medical issues, we expect the patient to be ready for discharge from a cath perspective on 05/27/2023.   Indications  Presence of drug coated stent in LAD coronary artery [Z95.5 (ICD-10-CM)]  Coronary artery disease involving native coronary artery of native heart with other form of angina pectoris (HCC) [I25.118  (ICD-10-CM)]  Severe calcific aortic valve stenosis [I35.0 (ICD-10-CM)]   Clinical Presentation  CHF/Shock Congestive heart failure not present. No shock present.   Procedural Details  Technical Details PCP: Anson Fret, MD  [facility] Providers Primary Cardiologist:  Oniel Meleski Lemma, MD    Structural Heart Cardiologist: Tonny Bollman, MD  [name] is a very pleasant 78 y.o. male with a PMH reviewed below-notable for CAD-PCI, PAF/atrial flutter s/p flutter ablation in 2006, A-fib ablation in 2015), Aortic Stenosis, and interstitial lung disease who presents here for telephone follow-up to discuss test results and plan based on recent echocardiogram showing progression of aortic stenosis from moderate to severe.   The echo was ordered by Dr. Vassie Loll for PCCM, and I was notified of the results.  He was then seen by virtual follow-up to discuss plan for initiation of TAVR workup with right and left heart cath.  He has been referred to Dr. Excell Seltzer in the Structural heart Clinic.    Time Out: Verified patient identification, verified procedure, site/side was marked, verified correct patient position, special equipment/implants available, medications/allergies/relevent history reviewed, required imaging and test results available. Performed.  Access:  * RIGHT Radial Artery: 6 Fr sheath -- Seldinger technique using glide sheath micropuncture Kit Direct ultrasound guidance used. Permanent image obtained and placed on chart. 10 mL radial cocktail IA; 4,000 Units IV Heparin * Right Brachial/Antecubital Vein: 5Fr  sheath -Seldinger technique using micropuncture needle and glide sheath Direct ultrasound guidance used. Permanent image obtained and placed on chart.  Right Heart Catheterization: 5 Fr Theone Murdoch catheter advanced under fluoroscopy with balloon inflated to the RA, RV, then PCWP-PA for hemodynamic measurement.  * Simultaneous FA & PA blood gases checked for SaO2% to  calculate FICK CO/CI  * Catheter removed completely out of the body with balloon deflated.  Left Heart Catheterization: 5 and 6 Fr Catheters advanced or exchanged over a J-wire under direct fluoroscopic guidance into the ascending aorta; TIG 4.0 catheter advanced first.  * LV Hemodynamics (no LV Gram): The aortic valve was crossed with a wire, but I could not get the catheter to push across the valve due to tortuosity in the upper extremity arterial tree. * Left Coronary Artery Cineangiography: Initially with TIG 4.0 catheter but then exchanged for a 6 French EBU 3.5 guide catheter  * Right Coronary Artery Cineangiography: TIG 4.0 catheter   Upon completion of Angiogaphy, the catheter was removed completely out of the body over a wire, without complication. Brachial Sheath(s) removed in the Cath Lab with manual pressure for hemostasis.    Radial sheath removed in the Cardiac Catheterization lab with TR Band placed for hemostasis.  TR Band: 1545 Hours; 14 mL air; reverse Barbeau C  MEDICATIONS * SQ Lidocaine 3mL * Radial Cocktail: 3 mg Verapmil in 10 mL NS * Isovue Contrast: 70 mL * Heparin: 4000 units Estimated blood loss <50 mL.   During this procedure medications were administered to achieve and maintain moderate conscious sedation while the patient's heart rate, blood pressure, and oxygen saturation were continuously monitored and I was present face-to-face 100% of this time. Mable Paris Rad Tech and Rudy Rad Tech  Joni Reining Rierson RN are independent, trained observers who assisted in the monitoring of the patient's level of consciousness.   Medications (Filter: Administrations occurring from 1358 to 1605 on 05/27/23) lidocaine (PF) (XYLOCAINE) 1 % injection (mL)  Total volume: 4 mL Date/Time Rate/Dose/Volume Action   05/27/23 1426 2 mL Given   1450 2 mL Given   Radial Cocktail/Verapamil only (mL)  Total volume: 10 mL Date/Time Rate/Dose/Volume Action   05/27/23 1451 10 mL  Given   heparin sodium (porcine) injection (Units)  Total dose: 4,000 Units  Date/Time Rate/Dose/Volume Action   05/27/23 1517 4,000 Units Given   iohexol (OMNIPAQUE) 350 MG/ML injection (mL)  Total volume: 70 mL Date/Time Rate/Dose/Volume Action   05/27/23 1539 70 mL Given   Heparin (Porcine) in NaCl 1000-0.9 UT/500ML-% SOLN (mL)  Total volume: 1,000 mL Date/Time Rate/Dose/Volume Action   05/27/23 1604 500 mL Given   1604 500 mL Given    Contrast     Administrations occurring from 1358 to 1605 on 05/27/23:  Medication Name Total Dose  iohexol (OMNIPAQUE) 350 MG/ML injection 70 mL   Radiation/Fluoro  Fluoro time: 16.3 (min) DAP: 21608 (mGycm2) Cumulative Air Kerma: 382 (mGy) Complications  Complications documented before study signed (05/27/2023  4:16 PM)   No complications were associated with this study.  Documented by Caralee Ates, RN - 05/27/2023  3:46 PM     Coronary Findings  Diagnostic Dominance: Right Left Main  Vessel is normal in caliber and large.    Left Anterior Descending  Previously placed Prox LAD to Mid LAD stent of unknown type is widely patent. The lesion is located proximal to the major branch.  Mid LAD lesion is 50% stenosed with 55% stenosed side branch in 2nd Diag. The lesion is located at the bifurcation and focal. The lesion was previously treated using a drug eluting stent and angioplasty between 1-2 years ago. Previously placed stent displays restenosis.    First Diagonal Branch  Vessel is small in size.    First Septal Branch  Vessel is small in size.    Second Diagonal Branch    Second Septal Branch  Vessel is small in size.    Third Diagonal Branch  Vessel is small in size.    Left Circumflex  Vessel is large. Vessel is angiographically normal.  Dist Cx lesion is 45% stenosed. The lesion is eccentric and smooth.    First Obtuse Marginal Branch  Vessel is small in size.    Second Obtuse Marginal Branch  Vessel is small  in size.    First Left Posterolateral Branch  Vessel is small in size.    Second Left Posterolateral Branch  Vessel is small in size.    Third Left Posterolateral Branch  Vessel is moderate in size. Vessel is angiographically normal.    Left Posterior Atrioventricular Artery  Vessel is angiographically normal.    Right Coronary Artery  The vessel exhibits minimal luminal irregularities.  Previously placed Mid RCA stent of unknown type is widely patent. Previously placed stent displays no restenosis. Widely patent    Acute Marginal Branch  Vessel is small in size.    Right Ventricular Branch  Vessel is small in size.    Right Posterior Descending Artery  Vessel is moderate in size. Vessel is angiographically normal. Wraparound PDA covers the distal anterior apex  RPDA lesion is 50% stenosed.    Right Posterior Atrioventricular Artery  Vessel is small in size.    First Right Posterolateral Branch  Vessel is small in size.    Intervention   No interventions have been documented.   Right Heart  Right Heart Pressures Upper limit of normal normal PAP mean of 22 mm agree LV EDP is normal.  Right Atrium Right atrial pressure is normal.   Wall Motion  No LV gram         Left Heart  Aortic Valve There is severe aortic valve stenosis. By Echocardiogram.   Coronary Diagrams  Diagnostic Dominance: Right  Intervention   Implants   No implant documentation  for this case.   Syngo Images   Show images for CARDIAC CATHETERIZATION Images on Long Term Storage   Show images for Jaamal, Farooqui to Procedure Log  Procedure Log    Hemodynamics  Pressures Phases Resting  Right     RA Mean  mmHg 3    RA A-Wave  mmHg 6    RA V-Wave  mmHg 5    RV  mmHg 27/2  Pulmonary     PA  mmHg 28/14 (22)    PCW Mean  mmHg 9.0    PCW A-Wave  mmHg 10.0    PCW V-Wave  mmHg 15.0    PAPi   4.7    Saturations Phases Resting    PA  % 70    Arterial  % 99    Hemo  Data  Flowsheet Row Most Recent Value  Fick Cardiac Output 5.61 L/min  Fick Cardiac Output Index 2.55 (L/min)/BSA  RA A Wave 6 mmHg  RA V Wave 5 mmHg  RA Mean 3 mmHg  RV Systolic Pressure 27 mmHg  RV Diastolic Pressure 2 mmHg  RV EDP 6 mmHg  PA Systolic Pressure 28 mmHg  PA Diastolic Pressure 14 mmHg  PA Mean 22 mmHg  PW A Wave 10 mmHg  PW V Wave 15 mmHg  PW Mean 9 mmHg  AO Systolic Pressure 119 mmHg  AO Diastolic Pressure 65 mmHg  AO Mean 91 mmHg  QP/QS 1  TPVR Index 8.61 HRUI  TSVR Index 35.63 HRUI  PVR SVR Ratio 0.15  TPVR/TSVR Ratio 0.24    Narrative & Impression  CLINICAL DATA:  Aortic valve replacement (TAVR), pre-op eval   EXAM: Cardiac TAVR CT   TECHNIQUE: The patient was scanned on a Siemens Force 192 slice scanner. A 120 kV retrospective scan was triggered in the descending thoracic aorta at 111 HU's. Gantry rotation speed was 270 msecs and collimation was .9 mm. The 3D data set was reconstructed in 5% intervals of the R-R cycle. Systolic and diastolic phases were analyzed on a dedicated work station using MPR, MIP and VRT modes. The patient received OMNIPAQUE IOHEXOL 350 MG/ML SOLN of contrast.   FINDINGS: Aortic Valve:   Tricuspid aortic valve with severely reduced cusp excursion. Severely thickened and severely calcified aortic valve cusps.   AV calcium score: 3744   Virtual Basal Annulus Measurements:   Maximum/Minimum Diameter: 31.5 x 24.7 mm   Perimeter: 86.8 mm   Area:  574 mm2   No significant LVOT calcifications.   Membranous septal length: 9 mm   Based on these measurements, the annulus would be suitable for a 29 mm Sapien 3 valve. Alternatively, Heart Team can consider 34 mm Evolut valve. Recommend Heart Team discussion for valve selection.   Sinus of Valsalva Measurements:   Non-coronary:  39 mm   Right - coronary:  39 mm   Left - coronary:  41 mm   Sinus of Valsalva Height:   Left: 25 mm   Right: 24 mm    Aorta: Conventional 3 vessel branch pattern of aortic arch.   Sinotubular Junction:  35 mm   Ascending Thoracic Aorta:  40 mm   Aortic Arch:  33 mm   Descending Thoracic Aorta:  29 mm   Coronary Artery Height above Annulus:   Left main: 17 mm   Right coronary: 16 mm   Coronary Arteries: Normal coronary origin. Right dominance. The study was performed without use of NTG and insufficient for plaque  evaluation. Prior PCI.   Optimum Fluoroscopic Angle for Delivery: LAO 12 CAU 10   OTHER:   Atria: Grossly normal.   Left atrial appendage: No thrombus.   Mitral valve: Grossly normal, mild mitral annular calcifications.   Pulmonary artery: Mildly dilated, 34 mm   Pulmonary veins: Normal anatomy.   IMPRESSION: 1. Tricuspid aortic valve with severely reduced cusp excursion. Severely thickened and severely calcified aortic valve cusps. 2. Aortic valve calcium score: 3744 3. Annulus area: 574 mm2, suitable for 29 mm Sapien 3 valve. No LVOT calcifications. Membranous septal length 9. 4. Sufficient coronary artery heights from annulus. 5. Optimum fluoroscopic angle for delivery: LAO 12 CAU 10   Electronically Signed: By: Weston Brass M.D. On: 06/03/2023 08:42     Narrative & Impression  CLINICAL DATA:  Aortic valve replacement, TAVR, preoperative evaluation.   EXAM: CT ANGIOGRAPHY CHEST, ABDOMEN AND PELVIS   TECHNIQUE: Non-contrast CT of the chest was initially obtained.   Multidetector CT imaging through the chest, abdomen and pelvis was performed using the standard protocol during bolus administration of intravenous contrast. Multiplanar reconstructed images and MIPs were obtained and reviewed to evaluate the vascular anatomy.   RADIATION DOSE REDUCTION: This exam was performed according to the departmental dose-optimization program which includes automated exposure control, adjustment of the mA and/or kV according to patient size and/or use of iterative  reconstruction technique.   CONTRAST:  OMNIPAQUE IOHEXOL 350 MG/ML SOLN   COMPARISON:  03/27/2021, 09/07/2019.   FINDINGS: CTA CHEST FINDINGS   Cardiovascular: The heart is borderline enlarged and there is a trace pericardial effusion. Multi-vessel coronary artery calcifications are noted. There is calcification of the aortic valve. There is atherosclerotic calcification of the aorta with aneurysmal dilatation of the ascending aorta measuring 4.0 cm. The pulmonary trunk is normal in caliber. No dissection is seen.   Mediastinum/Nodes: No mediastinal, hilar, or axillary lymphadenopathy. The thyroid gland, trachea, and esophagus are within normal limits.   Lungs/Pleura: Stable fibrotic changes and ground-glass attenuation is present in the lungs bilaterally. No consolidation, effusion, or pneumothorax is seen. Mild apical pleural and parenchymal thickening is present on the right.   Musculoskeletal: Degenerative changes are present in the thoracic spine. No acute osseous abnormality is seen.   Review of the MIP images confirms the above findings.   CTA ABDOMEN AND PELVIS FINDINGS   VASCULAR   Aorta: Normal caliber aorta without dissection, vasculitis or significant stenosis. Aortic atherosclerosis. The abdominal aorta measures up to 2.5 cm in diameter.   Celiac: Patent without evidence of aneurysm, dissection, vasculitis or significant stenosis.   SMA: Patent without evidence of aneurysm, dissection, vasculitis or significant stenosis.   Renals: Both renal arteries are patent without evidence of aneurysm, dissection, vasculitis, fibromuscular dysplasia or significant stenosis.   IMA: Patent without evidence of aneurysm, dissection, vasculitis or significant stenosis.   Inflow: Patent without evidence of aneurysm, dissection, vasculitis or significant stenosis.   Veins: No obvious venous abnormality within the limitations of this arterial phase study.    Review of the MIP images confirms the above findings.   NON-VASCULAR   Hepatobiliary: No focal liver abnormality is seen. No gallstones, gallbladder wall thickening, or biliary dilatation.   Pancreas: Unremarkable. No pancreatic ductal dilatation or surrounding inflammatory changes.   Spleen: Normal size. A small hypodensity is present in the anterior aspect of the spleen, likely cyst or hemangioma.   Adrenals/Urinary Tract: No adrenal nodule or mass. The kidneys enhance symmetrically. Nonobstructive renal calculi are present bilaterally.  There is no hydronephrosis. The bladder is unremarkable.   Stomach/Bowel: Small hiatal hernia is noted. Stomach is within normal limits. Appendix is surgically absent. No evidence of bowel wall thickening, distention, or inflammatory changes. No free air or pneumatosis is seen. Scattered diverticula are present along the colon without evidence of diverticulitis.   Lymphatic: No abdominal or pelvic lymphadenopathy.   Reproductive: Prostate is unremarkable.   Other: No abdominopelvic ascites. A small fat containing umbilical hernia is present.   Musculoskeletal: Degenerative changes are present in the lumbar spine. Bilateral pars defects are noted at L5 with mild anterolisthesis at L5-S1. No acute osseous abnormality is seen.   Review of the MIP images confirms the above findings.   IMPRESSION: 1. Aortic atherosclerosis with mild aneurysmal dilatation of the ascending aorta measuring 4.0 cm. Recommend annual imaging followup by CTA or MRA. This recommendation follows 2010 ACCF/AHA/AATS/ACR/ASA/SCA/SCAI/SIR/STS/SVM Guidelines for the Diagnosis and Management of Patients with Thoracic Aortic Disease. Circulation. 2010; 121: X914-N829. Aortic aneurysm NOS (ICD10-I71.9) 2. Coronary artery calcifications. 3. Stable fibrotic and interstitial changes in the lungs bilaterally. No acute infiltrate is seen. 4. Small hiatal hernia. 5. Bilateral  nephrolithiasis. 6. Diverticulosis without diverticulitis.     Electronically Signed   By: Thornell Sartorius M.D.   On: 06/03/2023 07:48      Impression:  This 78 year old gentleman has stage D, severe, symptomatic aortic stenosis with NYHA class III-IV symptoms of exertional shortness of breath and fatigue as well as left-sided chest discomfort consistent with chronic diastolic congestive heart failure.  He presented to the office today acutely short of breath although his oxygen saturation has been 100% on room air and his vital signs been stable.  I have personally reviewed his 2D echocardiogram, cardiac catheterization, and CTA studies.  His echocardiogram shows a trileaflet aortic valve with severe calcification and restricted leaflet mobility.  The mean gradient is 42 mmHg with a valve area of 0.88 cm by VTI.  Left ventricular systolic function is normal.  Cardiac catheterization showed about 50% mid LAD in-stent restenosis involving the ostium of a moderate-sized second diagonal branch.  Right heart pressures were within normal limits.  I agree that aortic valve replacement is indicated in this patient for relief of his symptoms and to prevent progressive left ventricular deterioration.  I think TAVR would be the best option for treating him given his age and comorbidities.  He is acutely short of breath and very fatigued today after walking into the office and his wife said the symptoms have been worsening over the past few weeks and have prompted admission to [facility].  He does have some interstitial lung disease on low-dose prednisone at home as well as a moderate LAD restenosis that I would not expect to be causing the symptoms.  His CTA of the chest yesterday was not time to look at the pulmonary arteries but I do not see any signs of emboli within the main pulmonary artery or its branches as well as the lobar pulmonary arteries.  His oxygen saturations have been 100% on room air which would  make pulmonary emboli unlikely.  I think it would be best to directly admit him to the hospital today for continued observation and management for severe symptomatic aortic stenosis.  His gated cardiac CTA shows anatomy suitable for TAVR using a 29 mm SAPIEN 3 valve.  His abdominal and pelvic CTA shows adequate pelvic vascular anatomy to allow transfemoral insertion.  The patient and his wife were counseled at length regarding  treatment alternatives for management of severe symptomatic aortic stenosis. The risks and benefits of surgical intervention has been discussed in detail. Long-term prognosis with medical therapy was discussed. Alternative approaches such as conventional surgical aortic valve replacement, transcatheter aortic valve replacement, and palliative medical therapy were compared and contrasted at length. This discussion was placed in the context of the patient's own specific clinical presentation and past medical history. All of their questions have been addressed.   Following the decision to proceed with transcatheter aortic valve replacement, a discussion was held regarding what types of management strategies would be attempted intraoperatively in the event of life-threatening complications, including whether or not the patient would be considered a candidate for the use of cardiopulmonary bypass and/or conversion to open sternotomy for attempted surgical intervention.  I think he would be a candidate for emergent sternotomy to manage any intraoperative complications.  The patient is aware of the fact that transient use of cardiopulmonary bypass may be necessary. The patient has been advised of a variety of complications that might develop including but not limited to risks of death, stroke, paravalvular leak, aortic dissection or other major vascular complications, aortic annulus rupture, device embolization, cardiac rupture or perforation, mitral regurgitation, acute myocardial infarction,  arrhythmia, heart block or bradycardia requiring permanent pacemaker placement, congestive heart failure, respiratory failure, renal failure, pneumonia, infection, other late complications related to structural valve deterioration or migration, or other complications that might ultimately cause a temporary or permanent loss of functional independence or other long term morbidity. The patient provides full informed consent for the procedure as described and all questions were answered.      Plan:  He is being directly admitted to the cardiology service into [facility] today.  He will be stabilized and we will plan transfemoral TAVR on Tuesday, 06/10/2023.   I spent 60 minutes performing this consultation and > 50% of this time was spent face to face counseling and coordinating the care of this patient's severe symptomatic aortic stenosis.   [name], MD 06/04/2023 5:06 PM

## 2023-06-04 NOTE — Progress Notes (Signed)
 Patient ID: Kenneth [name], male   DOB: 24-Jan-1946, 78 y.o.   MRN: [id number]  [facility]       [address]       [place] [address]             [phone]          CARDIOTHORACIC SURGERY CONSULTATION REPORT  PCP is [facility] Referring Provider is Tonny Bollman, MD Primary Cardiologist is Lucretia Pendley Lemma, MD  Reason for consultation:  Severe aortic stenosis  HPI:  The patient is a 78 year old gentleman with a history of coronary artery disease status post multiple PCI's, paroxysmal atrial flutter status post ablation in 2006 in 2015, severe aortic stenosis, hypertension, hyperlipidemia, type 2 diabetes, and interstitial lung disease on chronic low-dose prednisone who was referred for consideration of TAVR.  An echocardiogram on 05/14/2023 showed severe aortic stenosis with a trileaflet severely calcified aortic valve with a mean gradient of 42 mmHg and a valve area by VTI of 0.88 cm.  There is mild ascending aortic dilation at 4.1 cm.  There is mild mitral regurgitation and normal left ventricular ejection fraction of 60 to 65%.  There is mild concentric LVH with grade 2 diastolic dysfunction.  He subsequently underwent cardiac catheterization on 05/27/2023 showing about 50% in-stent restenosis within the LAD.  There is 55% stenosis of the second diagonal branch.  There was 45% distal left circumflex stenosis.  The previously placed mid RCA stent was widely patent.  There was 50% stenosis in the PDA.  Right heart pressures were fairly normal with a PA pressure of 28/14 and a wedge pressure of 10.  He was discharged home after his catheterization.  He and his wife report continued episodes of exertional shortness of breath and left-sided chest discomfort.  He presented to the [facility] ED on 06/02/2023 for evaluation.  There is no evidence of heart failure or acute ischemia.  Electrocardiogram is normal.  Chest  x-ray was normal.  BNP and high-sensitivity troponin were normal.  His oxygen saturations on room air 100%.  His symptoms were felt to be related to his severe aortic stenosis and he was seen by Dr. Excell Seltzer.  He was felt to be doing well yesterday after completion of his workup and being seen by Dr. Excell Seltzer and was discharged home with plans for TAVR next Tuesday.  He returned today to see me in the office for surgical evaluation and was noted to be very short of breath on presentation and had difficulty walking from the front door back to the room.  After sitting down for a while he improved enough to talk but remained fatigued and short of breath.  He reports having some left-sided chest discomfort.  He has had occasional episodes of dizziness at home with positional change but no syncope.  He reports some orthopnea but no peripheral edema.  Past Medical History:  Diagnosis Date   Arthritis    CAD S/P percutaneous coronary angioplasty 06/28/2016   Cardiologist- Dr. Herbie Baltimore: 06/28/16 -- [facility] PCI (Synergy DES 2.75 x 20 mm); 07/10/16: Staged p-m LAD PCI (FFR 0.75) - DES PCI (Synergy DES 3.5 x 20 mm); 10/11/2020: mid-distal LAD 70% (FFR 0.75) DES PCI (Synergy DES 2.5 mm x 32 mm - overlaps prox-mid stent, tapered post-dilation 3.7 - 2.6 mm.;  06/2021 - Cath with severe LAD-D2 bifurcation ISR -- PTCA / kissing balloon - reduced LAD to ~25% & D2 to 40%  Chronic dryness of both eyes    Chronic heart failure with preserved ejection fraction (HFpEF) (HCC) 05/22/2013   Diverticulosis of colon    Epiretinal membrane    "epiretinal attachment" (06/28/2016) 10-14-2017 per pt oringinally in both , now only unilateral    GERD (gastroesophageal reflux disease)    Hiatal hernia    History of adenomatous polyp of colon    History of concussion 08/23/2017   w/o loc--- per pt no residual   History of kidney stones    History of rheumatic fever 1958   NO Evidence of Vavlular disease -> only mild Aortic Sclerosis   History  of skin cancer    excision leg;  froze the left arm--- unsure BCC or SCC    Hyperlipidemia    Hypertension    ILD (interstitial lung disease) (HCC)    pulmologist-  dr Vassie Loll--  secondary to methotrexate use --- last PFTs 04-25-2015  mild restriction   Iron deficiency anemia    Mild obstructive sleep apnea    10-04-2019 per pt  had a sleep study years ago , was told no cpap recommended pt denies    Nephrolithiasis    CT 09-07-2019 bilateral nonobstructive stones   Paroxysmal atrial fibrillation (HCC) 2015   a. 11/2003 Tikosyn initiated - subsequently d/c'd;  b. 2006 RFCA for Afib @ [facility];  c. 09/2011 Echo: EF 60-65%, Gr 2 DD;  d. DCCV 10/2011 , 04/2012, 12/ 2014, & 02/ 2015;  e. RFCA 01-13-2014   Plaque psoriasis 1969   follow[facility] rheumatologist  and [facility] [place]   PONV (postoperative nausea and vomiting)    Right ureteral stone    S/P drug eluting coronary stent placement    06-28-2016  x1 to mRCA;  07-10-2016 x1 to mLAD   Scarring of lung    Seasonal allergies    Short-term memory loss    Type 2 diabetes mellitus (HCC)    followed by pcp   Ureteral calculus, right    Wears hearing aid in both ears     Past Surgical History:  Procedure Laterality Date   APPENDECTOMY  1996   ATRIAL FIBRILLATION ABLATION N/A 01/13/2014   repeat PVI, also left atrial ablation performed with successfull ablation of LA flutter by Dr Johney Frame   ATRIAL FIBRILLATION ABLATION  08/2004   Dr Delena Serve [facility])   CARDIAC STRESS PET  03/12/2022   Normal perfusion study/low risk.  Normal LVEF and normal LVEF preserved.  Suspect reduced myocardial blood flow reserve related to microvascular disease.  No evidence of infarction or ischemia.  Resting EF 48%.  Stress EF 54%.  Mildly enlarged LV. Asc Ao 4.1 cm--stable.  Also stable mosaic attenuation in the lungs no change.   CARDIOVERSION N/A 05/22/2013   Procedure: CARDIOVERSION;  Surgeon: Hillis Range, MD;  Location: [facility] OR;   Service: Cardiovascular;  Laterality: N/A;   CARDIOVERSION N/A 10/23/2011   Procedure: CARDIOVERSION;  Surgeon: Duke Salvia, MD;  Location: [facility] CATH LAB;  Service: Cardiovascular;  Laterality: N/A;   CARDIOVERSION N/A 05/01/2012   Procedure: CARDIOVERSION;  Surgeon: Duke Salvia, MD;  Location: [facility] CATH LAB;  Service: Cardiovascular;  Laterality: N/A;   CARPAL TUNNEL RELEASE Right 1980s   CATARACT EXTRACTION W/ INTRAOCULAR LENS  IMPLANT, BILATERAL  2012  approx.   CORONARY BALLOON ANGIOPLASTY N/A 07/04/2021   Procedure: CORONARY BALLOON ANGIOPLASTY;  Surgeon: Marykay Lex, MD;  Location: [facility] INVASIVE CV LAB;  Service: Cardiovascular;; scoring  balloon and an [place] balloon PTCA of LAD; with kissing balloon PTCA of LAD-D2 bifurcation stented segment reducing LAD to 20 % ISR and ostial D2 to 40%.   CORONARY PRESSURE/FFR STUDY N/A 06/28/2016   Procedure: Intravascular Pressure Wire/FFR Study;  Surgeon: Marykay Lex, MD;  Location: [facility] INVASIVE CV LAB;  Service: Cardiovascular: FFR of mLAD ~65% lesion = 0.75 post --> Staged PCI   CORONARY PRESSURE/FFR STUDY N/A 10/11/2020   Procedure: INTRAVASCULAR PRESSURE WIRE/FFR STUDY;  Surgeon: Marykay Lex, MD;  Location: [facility] INVASIVE CV LAB;  Service: Cardiovascular;  Laterality: LAD mid-distal just after D2 --> FFR 0.75 SIGNIFICANT => PCI   CORONARY STENT INTERVENTION N/A 06/28/2016   Procedure: Coronary Stent Intervention;  Surgeon: Marykay Lex, MD;  Location: [facility] INVASIVE CV LAB;  Service: Cardiovascular;  Laterality: mRCA PCI -Synergy DES 2.75 m x 20 mm (3.1 mm)   CORONARY STENT INTERVENTION N/A 07/10/2016   Procedure: Coronary Stent Intervention;  Surgeon: Marykay Lex, MD;  Location: [facility] INVASIVE CV LAB;  Service: Cardiovascular: mLAD PCI Synergy DES 3.5 mm x 20 mm)   CORONARY STENT INTERVENTION N/A 10/11/2020   Procedure: CORONARY STENT INTERVENTION;  Surgeon: Marykay Lex, MD;  Location: [facility] INVASIVE CV LAB;  Service: Cardiovascular:  mid-distal LAD ~70% (FFR 0.75): SYNERGY DES 2.5 MM X 32 MM (overlaps prox-mid Stent from 07/2016) - Tapered post-dilation 3.7 mm ->2.6 mm; Jailed D2 PTCA reduced 60% to 40%.   CYSTOSCOPY WITH RETROGRADE PYELOGRAM, URETEROSCOPY AND STENT PLACEMENT Right 10/16/2017   Procedure: CYSTOSCOPY WITH RIGHT RETROGRADE RIGHT URETEROSCOPY AND STONE BASKET EXTRACTION;  Surgeon: Bjorn Pippin, MD;  Location: [facility];  Service: Urology;  Laterality: Right;   CYSTOSCOPY WITH RETROGRADE PYELOGRAM, URETEROSCOPY AND STENT PLACEMENT Right 10/08/2019   Procedure: CYSTOSCOPY WITH URETEROSCOPY; RETROGRADE PYELOGRAM; STONE BASKET EXTRACTION;  STENT PLACEMENT;  Surgeon: Malen Gauze, MD;  Location: [facility];  Service: Urology;  Laterality: Right;  1 HR   CYSTOSCOPY WITH RETROGRADE PYELOGRAM, URETEROSCOPY AND STENT PLACEMENT Right 03/28/2020   Procedure: CYSTOSCOPY WITH RIGHT RETROGRADE PYELOGRAM, URETEROSCOPY WITH HOLMIUM LASER AND STENT PLACEMENT;  Surgeon: Bjorn Pippin, MD;  Location: [facility];  Service: Urology;  Laterality: Right;   CYSTOSCOPY WITH RETROGRADE PYELOGRAM, URETEROSCOPY AND STENT PLACEMENT Left 10/22/2021   Procedure: CYSTOSCOPY WITH LEFT RETROGRADE URETEROSCOPY WIHT HOLMIUM LASERAND STENT PLACEMENT;  Surgeon: Bjorn Pippin, MD;  Location: [facility];  Service: Urology;  Laterality: Left;   CYSTOSCOPY/RETROGRADE/URETEROSCOPY/STONE EXTRACTION WITH BASKET Right 10/27/2017   Procedure: CYSTOSCOPY/RETROGRADE/URETEROSCOPY/STONE EXTRACTION WITH BASKET/URETERAL STENT PLACEMENT and laser;  Surgeon: Bjorn Pippin, MD;  Location: [facility];  Service: Urology;  Laterality: Right;   CYSTOSCOPY/URETEROSCOPY/HOLMIUM LASER/STENT PLACEMENT Right 01/03/2023   Procedure: CYSTOSCOPY RIGHT RETROGRADE URETEROSCOPY/HOLMIUM LASER/STENT PLACEMENT;  Surgeon: Bjorn Pippin, MD;  Location: [facility];  Service: Urology;  Laterality: Right;   EXTRACORPOREAL SHOCK WAVE LITHOTRIPSY Left 08/10/2020   Procedure:  EXTRACORPOREAL SHOCK WAVE LITHOTRIPSY (ESWL);  Surgeon: Crist Fat, MD;  Location: [facility];  Service: Urology;  Laterality: Left;   EXTRACORPOREAL SHOCK WAVE LITHOTRIPSY Right 12/16/2022   Procedure: RIGHT EXTRACORPOREAL SHOCK WAVE LITHOTRIPSY (ESWL);  Surgeon: Bjorn Pippin, MD;  Location: [facility];  Service: Urology;  Laterality: Right;   HOLMIUM LASER APPLICATION Right 10/27/2017   Procedure: POSSIBLE HOLMIUM LASER APPLICATION;  Surgeon: Bjorn Pippin, MD;  Location: [facility];  Service: Urology;  Laterality: Right;   HOLMIUM LASER APPLICATION Right 10/08/2019   Procedure: HOLMIUM LASER APPLICATION;  Surgeon: Malen Gauze,  MD;  Location: [facility];  Service: Urology;  Laterality: Right;   LEFT HEART CATH AND CORONARY ANGIOGRAPHY  06/30/2003   Dr. Riley Kill: NO Significant/Obstructive CAD; Preseved LVEF (for Recurrent Afib)   NASAL SINUS SURGERY     x 2   NM MYOVIEW LTD  02/11/2021   Lexiscan:EF 55-65%.  Normal function.  No ischemia or infarction.  No EKG changes noted.   PROXIMAL INTERPHALANGEAL FUSION (PIP) Left 01-05-2001    dr graves   correction clawtoe and extensor tendon lengthening   RIGHT/LEFT HEART CATH AND CORONARY ANGIOGRAPHY N/A 06/28/2016   Procedure: Right/Left Heart Cath and Coronary Angiography;  Surgeon: Marykay Lex, MD;  Location: [facility] INVASIVE CV LAB;  Service: Cardiovascular: mRCA 99% (TIMI 2), mLAD ~65% (FFR 0.75), OM3 55%. mild Pulm HTN. Mod LVEDP elevation. EF 55-65%.   RIGHT/LEFT HEART CATH AND CORONARY ANGIOGRAPHY N/A 10/11/2020   Procedure: RIGHT/LEFT HEART CATH AND CORONARY ANGIOGRAPHY;  Surgeon: Marykay Lex, MD;  Location: [facility] INVASIVE CV LAB;; Patent DES in mRCA & p-mLAD. CULPRIT = m-d LAD 70% (just after D1 w/ Ost 60%) FFR 0.75 => DES PCI m-dLAD & PTCA ost D1 (overlapping prior stent distally & crossing D2 w/  Ost D2 post PTCA). RIGHT HEART CATH PRESSURES,  & CO-CI ARE NORMAL. NO PULM HTN    RIGHT/LEFT HEART CATH AND CORONARY ANGIOGRAPHY N/A 07/04/2021   Procedure: RIGHT/LEFT HEART CATH AND CORONARY ANGIOGRAPHY;  Surgeon: Marykay Lex, MD;  Location: [facility] INVASIVE CV LAB;  Service: Cardiovascular;; moderately elevated LVEDP of 20 mmHg, and PCWP-28 mmHg -> mean PAP of 24 mmHg.  Moderate AS-mean gradient 33 mmHg.  (Similar to Echo 26 mmHg); borderline low BP.  CO-CI 9.2 and 4.0.. Stable CAD W/ progression of ISR of LAD stent at D2 => PCI   RIGHT/LEFT HEART CATH AND CORONARY ANGIOGRAPHY N/A 05/27/2023   Procedure: RIGHT/LEFT HEART CATH AND CORONARY ANGIOGRAPHY;  Surgeon: Marykay Lex, MD;  Location: [facility] INVASIVE CV LAB;  Service: Cardiovascular;  Laterality: N/A;   ROTATOR CUFF REPAIR Bilateral last one 2014   TONSILLECTOMY  age 68   TRANSTHORACIC ECHOCARDIOGRAM  09/29/2020   EF > 75%, Hyperdynamic. No RWMA. Mild LVH. ~ DD with mild LA dilation. Mild AI. Normal RV size & fxn, ? Dilated IVC c/w CVP ~ 15 mHg.   TRANSTHORACIC ECHOCARDIOGRAM  04/12/2021   EF estimated 65%.  Normal LV size and function.  No RWMA.  Mild LVH.  GRII DD.  Elevated LVEDP (c/w HFpEF).  Normal RV size.  Mildly elevated PAP estimated 36 mmHg.  Mild MR.  Mild AS (mean gradient 26 mmHg.  DI 0.32.  Aortic root 40 mm, ascending aorta 41 mm.   URETEROSCOPIC LASER LITHOTRIPSY STONE EXTRACTIONS/  STENT PLACEMENT Bilateral 09-10-2007   dr Annabell Howells  [facility]   per pt has had several prior ureteroscopic stone extraction since age 12 , last one 09-10-2007   Zio patch monitor:  02/2021   Probably Sinus Rhythm: Heart rate 56-146 bpm, average 83 bpm.  Frequent PACs noted with rare isolated PVCs.  Multiple episodes of PAT/atrial runs-longest 23 seconds average heart rate 95 bpm, fastest 5 beats at a max rate of 231 bpm.  No evidence of A-fib or atrial flutter.  No prolonged or sustained SVT/PAT.    Family History  Problem Relation Age of Onset   Coronary artery disease Mother    Breast cancer Mother    Colon cancer Mother    Heart  disease Maternal Grandmother  MI   Stroke Paternal Grandfather        CVA   Coronary artery disease Paternal Grandfather    Esophageal cancer Neg Hx    Stomach cancer Neg Hx    Rectal cancer Neg Hx     Social History   Socioeconomic History   Marital status: Married    Spouse name: Not on file   Number of children: 1   Years of education: Not on file   Highest education level: Not on file  Occupational History   Occupation: Retired Psychologist, sport and exercise  Tobacco Use   Smoking status: Former    Types: Cigars    Quit date: 09/29/1997    Years since quitting: 25.6   Smokeless tobacco: Former    Types: Snuff, Chew   Tobacco comments:    Form smoker and chew tobacco 01/25/2021  Vaping Use   Vaping status: Never Used  Substance and Sexual Activity   Alcohol use: No   Drug use: No   Sexual activity: Not on file  Other Topics Concern   Not on file  Social History Narrative   Pt lives in [place] with spouse.     Retired.    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 -> subsequently worked in [employer] (seconded to [employer]).       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        Had been very active as an Charity fundraiser"  - now doing his best to "stay out of this mess of politics" -- has declined efforts to get him to run for office.      Now is primary caregiver for his wife [name]) - who has metastatic CA & has had CVA x 2.  Pretty much "total care".  Very emotionally taxing.      He has plans to build homes for all of his children & grandchildren on their property - to ensure they all have a place to live - Not transferable   Social Drivers of Health   Financial Resource Strain: Not on file  Food Insecurity: No Food Insecurity (06/04/2023)   Hunger Vital Sign    Worried About Running Out of Food in the Last Year: Never true    Ran Out of Food in the Last Year: Never true   Transportation Needs: No Transportation Needs (06/04/2023)   PRAPARE - Administrator, Civil Service (Medical): No    Lack of Transportation (Non-Medical): No  Physical Activity: Not on file  Stress: Not on file  Social Connections: Moderately Integrated (06/04/2023)   Social Connection and Isolation Panel [NHANES]    Frequency of Communication with Friends and Family: More than three times a week    Frequency of Social Gatherings with Friends and Family: Three times a week    Attends Religious Services: 1 to 4 times per year    Active Member of Clubs or Organizations: No    Attends Banker Meetings: Never    Marital Status: Married  Catering manager Violence: Not At Risk (06/04/2023)   Humiliation, Afraid, Rape, and Kick questionnaire    Fear of Current or Ex-Partner: No    Emotionally Abused: No    Physically Abused: No    Sexually Abused: No    Prior to Admission medications   Medication Sig Start Date End Date Taking? Authorizing Provider  acetaminophen (TYLENOL) 500 MG tablet Take 1,000 mg  by mouth every 6 (six) hours as needed for mild pain (pain score 1-3) or moderate pain (pain score 4-6).   Yes [provider]  Adalimumab-bwwd (HADLIMA) 40 MG/0.8ML SOSY Inject 40 mg into the skin every 14 (fourteen) days.   Yes [provider]  apixaban (ELIQUIS) 5 MG TABS tablet Take 1 tablet (5 mg total) by mouth 2 (two) times daily. 01/12/21  Yes Fenton, Clint R, PA  Calcium Carbonate-Vit D-Min (CALCIUM 1200 PO) Take 1 tablet by mouth at bedtime.   Yes [provider]  cetirizine (ZYRTEC) 10 MG tablet Take 10 mg by mouth in the morning.   Yes [provider]  Dorzolamide HCl-Timolol Mal PF 2-0.5 % SOLN Place 1 drop into both eyes at bedtime.   Yes [provider]  empagliflozin (JARDIANCE) 25 MG TABS tablet Take 12.5 mg by mouth daily. 12/26/21  Yes [provider]  ferrous sulfate 325 (65 FE) MG tablet Take 325 mg  by mouth every Monday, Wednesday, and Friday.   Yes Ronney Asters, NP  furosemide (LASIX) 20 MG tablet Take 20 mg by mouth 2 (two) times a week. Monday and Thursday   Yes [provider]  galantamine (RAZADYNE ER) 24 MG 24 hr capsule Take 24 mg by mouth in the morning. 08/30/20  Yes [provider]  ibuprofen (ADVIL) 200 MG tablet Take 200 mg by mouth every 6 (six) hours as needed for moderate pain (pain score 4-6).   Yes [provider]  memantine (NAMENDA) 10 MG tablet Take 20 mg by mouth at bedtime. 08/30/20  Yes [provider]  metFORMIN (GLUCOPHAGE-XR) 500 MG 24 hr tablet Take 500 mg by mouth at bedtime.   Yes [provider]  Multiple Vitamins-Minerals (MULTIVITAMIN WITH MINERALS) tablet Take 1 tablet by mouth daily.   Yes [provider]  nitroGLYCERIN (NITROSTAT) 0.4 MG SL tablet Place 1 tablet (0.4 mg total) under the tongue every 5 (five) minutes as needed. 07/04/21  Yes Arty Baumgartner, NP  omeprazole (PRILOSEC OTC) 20 MG tablet Take 20 mg by mouth daily as needed (Acid reflux).   Yes [provider]  predniSONE (DELTASONE) 5 MG tablet Take 1 tablet by mouth once daily with breakfast 03/07/23  Yes Oretha Milch, MD  ranolazine (RANEXA) 500 MG 12 hr tablet Take 1 tablet (500 mg total) by mouth 2 (two) times daily. 11/11/22  Yes Marykay Lex, MD  rosuvastatin (CRESTOR) 40 MG tablet Take 1 tablet (40 mg total) by mouth daily. Patient taking differently: Take 40 mg by mouth at bedtime. 12/28/21  Yes Marykay Lex, MD  tamsulosin (FLOMAX) 0.4 MG CAPS capsule Take 1 capsule by mouth once daily Patient taking differently: Take 0.4 mg by mouth daily as needed (kidney stones). 03/13/21  Yes McKenzie, Mardene Celeste, MD  hydrOXYzine (ATARAX) 10 MG tablet Take 10 mg by mouth at bedtime as needed for anxiety or itching. Patient not taking: Reported on 06/04/2023 04/21/23   [provider]  sertraline (ZOLOFT) 100 MG tablet Take  by mouth. Patient not taking: Reported on 06/04/2023 05/05/23   [provider]    No current facility-administered medications for this visit.   No current outpatient medications on file.   Facility-Administered Medications Ordered in Other Visits  Medication Dose Route Frequency Provider Last Rate Last Admin   lidocaine (cardiac) 100 mg/14ml (XYLOCAINE) 20 MG/ML injection 2%    Anesthesia Intra-op Flowers, Rokoshi T, CRNA   60 mg at 03/25/13 1617  propofol (DIPRIVAN) 10 mg/mL bolus/IV push    Anesthesia Intra-op Flowers, Rokoshi T, CRNA   90 mg at 03/25/13 1617    Allergies  Allergen Reactions   Hydrocodone Shortness Of Breath    In combination with decongestants   Other Palpitations and Other (See Comments)    ALL DECONGESTANTS - CAUSE PALPITATIONS; THROWS HEART RHYTHM OUT OF BALANCE   Oxycodone Shortness Of Breath   Sudafed [Pseudoephedrine] Palpitations   Tramadol Shortness Of Breath and Rash    flushing   Lopid [Gemfibrozil] Other (See Comments)    Dizziness        Review of Systems:   General:  decreased appetite, decreased energy, no weight gain, no weight loss, no fever  Cardiac:  + chest pain with exertion, + chest pain at rest, + SOB with minimal exertion, + some resting SOB, no PND, + orthopnea, no palpitations, no arrhythmia, no atrial fibrillation, no LE edema, + dizzy spells, no syncope  Respiratory:  + shortness of breath, no home oxygen, no productive cough, no dry cough, no bronchitis, no wheezing, no hemoptysis, no asthma, no pain with inspiration or cough, no sleep apnea, no CPAP at night  GI:   no difficulty swallowing, no reflux, no frequent heartburn, no hiatal hernia, no abdominal pain, no constipation, no diarrhea, no hematochezia, no hematemesis, no melena  GU:   no dysuria,  no frequency, no urinary tract infection, no hematuria, no enlarged prostate, no kidney stones, no kidney disease  Vascular:  no pain suggestive of claudication, n pain in  feet, ono leg cramps, no varicose veins, no DVT, no non-healing foot ulcer  Neuro:   no stroke, no TIA's, no seizures, no headaches, no temporary blindness one eye,  no slurred speech, no peripheral neuropathy, no chronic pain, no instability of gait, no memory/cognitive dysfunction  Musculoskeletal: no arthritis, no joint swelling, no myalgias, no difficulty walking, normal mobility   Skin:   no rash, no itching, no skin infections, no pressure sores or ulcerations  Psych:   no anxiety, no depression, no nervousness, no unusual recent stress  Eyes:   no blurry vision, no floaters, no recent vision changes, + wears glasses  ENT:   + hearing loss, no loose or painful teeth, no dentures, last saw dentist this year  Hematologic:  no easy bruising, no abnormal bleeding, no clotting disorder, no frequent epistaxis  Endocrine:  + diabetes, does not check CBG's at home     Physical Exam:   BP (!) 143/88   Pulse 83   Resp 20   Ht 6\' 3"  (1.905 m)   Wt 204 lb (92.5 kg)   SpO2 98% Comment: RA  BMI 25.50 kg/m   General:  Frail appearing, dyspneic  HEENT:  Unremarkable, NCAT, PERLA, EOMI  Neck:   no JVD, no bruits, no adenopathy   Chest:   clear to auscultation, symmetrical breath sounds, no wheezes, no rhonchi   CV:   RRR, 3/6 systolic murmur RSB, no diastolic murmur  Abdomen:  soft, non-tender, no masses   Extremities:  warm, well-perfused, pedal pulses palpable, no lower extremity edema  Rectal/GU  Deferred  Neuro:   Grossly non-focal and symmetrical throughout  Skin:   Clean and dry, no rashes, no breakdown  Diagnostic Tests:  Lab Results: Recent Labs    06/02/23 1815 06/03/23 0438  WBC 8.2 7.2  HGB 13.2 11.9*  HCT 40.4 35.5*  PLT 187 160   BMET:  Recent Labs    06/02/23 1815  06/03/23 0438  NA 137 138  K 4.2 3.7  CL 105 108  CO2 21* 24  GLUCOSE 142* 116*  BUN 15 15  CREATININE 1.17 1.01  CALCIUM 9.4 8.5*    CBG (last 3)  Recent Labs    06/03/23 0722 06/03/23 1151  06/03/23 1604  GLUCAP 92 85 150*   ECHOCARDIOGRAM REPORT       Patient Name:   Kenneth [name] Date of Exam: 05/14/2023  Medical Rec #:  [id number]       Height:       75.0 in  Accession #:    [id number]      Weight:       206.8 lb  Date of Birth:  1945-08-18       BSA:          2.226 m  Patient Age:    77 years        BP:           124/80 mmHg  Patient Gender: M               HR:           70 bpm.  Exam Location:  [address]   Procedure: 2D Echo, Cardiac Doppler and Color Doppler   Indications:    I35.0 Aortic Stenosis    History:        Patient has prior history of Echocardiogram examinations,  most                 recent 04/12/2021. CAD; Risk Factors:Hypertension,  Dyslipidemia,                 Diabetes and Sleep Apnea.    Sonographer:    Sedonia Small Rodgers-Jones RDCS  Referring Phys: [id number] RAKESH V ALVA   IMPRESSIONS     1. Left ventricular ejection fraction, by estimation, is 60 to 65%. The  left ventricle has normal function. The left ventricle has no regional  wall motion abnormalities. There is mild concentric left ventricular  hypertrophy. Left ventricular diastolic  parameters are consistent with Grade II diastolic dysfunction  (pseudonormalization).   2. Right ventricular systolic function is normal. The right ventricular  size is normal. There is normal pulmonary artery systolic pressure. The  estimated right ventricular systolic pressure is 34.6 mmHg.   3. Left atrial size was moderately dilated.   4. The mitral valve is degenerative. Mild mitral valve regurgitation. No  evidence of mitral stenosis. Moderate mitral annular calcification.   5. The tricuspid valve is abnormal. Tricuspid valve regurgitation is  moderate.   6. The aortic valve is tricuspid. There is severe calcifcation of the  aortic valve. Aortic valve regurgitation is mild. Severe aortic valve  stenosis. Aortic valve area, by VTI measures 0.88 cm. Aortic valve mean  gradient measures 42.0 mmHg.    7. Aortic dilatation noted. There is mild dilatation of the ascending  aorta, measuring 41 mm.   8. The inferior vena cava is normal in size with greater than 50%  respiratory variability, suggesting right atrial pressure of 3 mmHg.   FINDINGS   Left Ventricle: Left ventricular ejection fraction, by estimation, is 60  to 65%. The left ventricle has normal function. The left ventricle has no  regional wall motion abnormalities. The left ventricular internal cavity  size was normal in size. There is   mild concentric left ventricular hypertrophy. Left ventricular diastolic  parameters are consistent with Grade II diastolic dysfunction  (pseudonormalization).  Right Ventricle: The right ventricular size is normal. No increase in  right ventricular wall thickness. Right ventricular systolic function is  normal. There is normal pulmonary artery systolic pressure. The tricuspid  regurgitant velocity is 2.81 m/s, and   with an assumed right atrial pressure of 3 mmHg, the estimated right  ventricular systolic pressure is 34.6 mmHg.   Left Atrium: Left atrial size was moderately dilated.   Right Atrium: Right atrial size was normal in size.   Pericardium: There is no evidence of pericardial effusion.   Mitral Valve: The mitral valve is degenerative in appearance. Moderate  mitral annular calcification. Mild mitral valve regurgitation. No evidence  of mitral valve stenosis.   Tricuspid Valve: The tricuspid valve is abnormal. Tricuspid valve  regurgitation is moderate.   The aortic valve is tricuspid. There is severe calcifcation of the aortic  valve. Aortic valve regurgitation is mild. Severe aortic stenosis is  present. .   Pulmonic Valve: The pulmonic valve was normal in structure. Pulmonic valve  regurgitation is not visualized.   Aorta: The aortic root is normal in size and structure and aortic  dilatation noted. There is mild dilatation of the ascending aorta,  measuring 41  mm.   Venous: The inferior vena cava is normal in size with greater than 50%  respiratory variability, suggesting right atrial pressure of 3 mmHg.   IAS/Shunts: No atrial level shunt detected by color flow Doppler.     LEFT VENTRICLE  PLAX 2D  LVIDd:         5.00 cm  LVIDs:         3.00 cm  LV PW:         1.30 cm  LV IVS:        1.30 cm  LVOT diam:     2.10 cm  LV SV:         69  LV SV Index:   31  LVOT Area:     3.46 cm     RIGHT VENTRICLE             IVC  RV Basal diam:  4.00 cm     IVC diam: 1.60 cm  RV S prime:     11.33 cm/s  TAPSE (M-mode): 1.7 cm   LEFT ATRIUM              Index        RIGHT ATRIUM           Index  LA diam:        5.30 cm  2.38 cm/m   RA Area:     17.50 cm  LA Vol (A2C):   104.0 ml 46.72 ml/m  RA Volume:   50.40 ml  22.64 ml/m  LA Vol (A4C):   77.1 ml  34.64 ml/m  LA Biplane Vol: 91.8 ml  41.24 ml/m   AORTIC VALVE  AV Area (Vmax):    0.81 cm  AV Area (Vmean):   0.78 cm  AV Area (VTI):     0.88 cm  AV Vmax:           374.20 cm/s  AV Vmean:          270.000 cm/s  AV VTI:            0.777 m  AV Peak Grad:      56.0 mmHg  AV Mean Grad:      42.0 mmHg  LVOT Vmax:  87.40 cm/s  LVOT Vmean:        60.800 cm/s  LVOT VTI:          0.198 m  LVOT/AV VTI ratio: 0.25  AI PHT:            665 msec    AORTA  Ao Root diam: 3.60 cm  Ao Asc diam:  4.10 cm   MITRAL VALVE                TRICUSPID VALVE  MV Area (PHT): 3.61 cm     TR Peak grad:   31.6 mmHg  MV Decel Time: 210 msec     TR Vmax:        281.00 cm/s  MV E velocity: 142.33 cm/s  MV A velocity: 74.27 cm/s   SHUNTS  MV E/A ratio:  1.92         Systemic VTI:  0.20 m                              Systemic Diam: 2.10 cm   Dalton McleanMD  Electronically signed by Wilfred Lacy  Signature Date/Time: 05/14/2023/1:30:56 PM    Physicians  Panel Physicians Referring Physician Case Authorizing Physician  Marykay Lex, MD (Primary)     Procedures  RIGHT/LEFT HEART CATH AND  CORONARY ANGIOGRAPHY   Conclusion      Previously placed Prox LAD to Mid LAD stent of unknown type is  widely patent the second overlapping mid LAD stent has 50% in-stent restenosis (at the previous PTCA site) with 55% stenosed side branch in 2nd Diag.   Dist Cx lesion is 45% stenosed.   Previously placed Mid RCA stent of unknown type is  widely patent.   RPDA lesion is 50% stenosed.   Normal Right Heart Cath Numbers/Pressures    Severe Aortic Stenosis by Echocardiogram (able to cross with wire but not with catheter due to tortuosity in the innominate artery. Widely patent RCA stents with left 50% ISR of the LAD stent at the previous PTCA site-Diag bifurcation with 55% ostial Diag stenosis (does not appear to be flow-limiting) here Relatively Normal Right Heart Cath Pressures and Measurements: -RAP 5 mmHg, RV P-EDP 27/2-6 mmHg; PAP-mean 28/14-22 mmHg, PCWP 10 million mercury.  -AO P-MAP 119/65-91 mmHg. -Ao sat 99%, PA sat 70%; Fick Cardiac Output 5.61-2.55.      RECOMMENDATIONS Discharge home today after bedrest.  Has plan to follow-up with Dr. Excell Seltzer in the Aortic Valve Clinic for TAVR evaluation Reassess symptoms after TAVR, if still symptomatic, could consider ischemic evaluation of the LAD with a Myoview stress test. Restart Eliquis p.m. 05/27/2023; restart Jardiance 05/28/2023; hold metformin 48 hours post cath     Jackolyn Geron Lemma, MD     Recommendations  Antiplatelet/Anticoag Recommend to resume Apixaban, at currently prescribed dose and frequency on 05/27/2023. Concurrent antiplatelet therapy not recommended.  Discharge Date In the absence of any other complications or medical issues, we expect the patient to be ready for discharge from a cath perspective on 05/27/2023.   Indications  Presence of drug coated stent in LAD coronary artery [Z95.5 (ICD-10-CM)]  Coronary artery disease involving native coronary artery of native heart with other form of angina pectoris (HCC) [I25.118  (ICD-10-CM)]  Severe calcific aortic valve stenosis [I35.0 (ICD-10-CM)]   Clinical Presentation  CHF/Shock Congestive heart failure not present. No shock present.   Procedural Details  Technical Details PCP: Anson Fret, MD  [facility] Providers Primary Cardiologist:  Oniel Meleski Lemma, MD    Structural Heart Cardiologist: Tonny Bollman, MD  [name] is a very pleasant 78 y.o. male with a PMH reviewed below-notable for CAD-PCI, PAF/atrial flutter s/p flutter ablation in 2006, A-fib ablation in 2015), Aortic Stenosis, and interstitial lung disease who presents here for telephone follow-up to discuss test results and plan based on recent echocardiogram showing progression of aortic stenosis from moderate to severe.   The echo was ordered by Dr. Vassie Loll for PCCM, and I was notified of the results.  He was then seen by virtual follow-up to discuss plan for initiation of TAVR workup with right and left heart cath.  He has been referred to Dr. Excell Seltzer in the Structural heart Clinic.    Time Out: Verified patient identification, verified procedure, site/side was marked, verified correct patient position, special equipment/implants available, medications/allergies/relevent history reviewed, required imaging and test results available. Performed.  Access:  * RIGHT Radial Artery: 6 Fr sheath -- Seldinger technique using glide sheath micropuncture Kit Direct ultrasound guidance used. Permanent image obtained and placed on chart. 10 mL radial cocktail IA; 4,000 Units IV Heparin * Right Brachial/Antecubital Vein: 5Fr  sheath -Seldinger technique using micropuncture needle and glide sheath Direct ultrasound guidance used. Permanent image obtained and placed on chart.  Right Heart Catheterization: 5 Fr Theone Murdoch catheter advanced under fluoroscopy with balloon inflated to the RA, RV, then PCWP-PA for hemodynamic measurement.  * Simultaneous FA & PA blood gases checked for SaO2% to  calculate FICK CO/CI  * Catheter removed completely out of the body with balloon deflated.  Left Heart Catheterization: 5 and 6 Fr Catheters advanced or exchanged over a J-wire under direct fluoroscopic guidance into the ascending aorta; TIG 4.0 catheter advanced first.  * LV Hemodynamics (no LV Gram): The aortic valve was crossed with a wire, but I could not get the catheter to push across the valve due to tortuosity in the upper extremity arterial tree. * Left Coronary Artery Cineangiography: Initially with TIG 4.0 catheter but then exchanged for a 6 French EBU 3.5 guide catheter  * Right Coronary Artery Cineangiography: TIG 4.0 catheter   Upon completion of Angiogaphy, the catheter was removed completely out of the body over a wire, without complication. Brachial Sheath(s) removed in the Cath Lab with manual pressure for hemostasis.    Radial sheath removed in the Cardiac Catheterization lab with TR Band placed for hemostasis.  TR Band: 1545 Hours; 14 mL air; reverse Barbeau C  MEDICATIONS * SQ Lidocaine 3mL * Radial Cocktail: 3 mg Verapmil in 10 mL NS * Isovue Contrast: 70 mL * Heparin: 4000 units Estimated blood loss <50 mL.   During this procedure medications were administered to achieve and maintain moderate conscious sedation while the patient's heart rate, blood pressure, and oxygen saturation were continuously monitored and I was present face-to-face 100% of this time. Mable Paris Rad Tech and Rudy Rad Tech  Joni Reining Rierson RN are independent, trained observers who assisted in the monitoring of the patient's level of consciousness.   Medications (Filter: Administrations occurring from 1358 to 1605 on 05/27/23) lidocaine (PF) (XYLOCAINE) 1 % injection (mL)  Total volume: 4 mL Date/Time Rate/Dose/Volume Action   05/27/23 1426 2 mL Given   1450 2 mL Given   Radial Cocktail/Verapamil only (mL)  Total volume: 10 mL Date/Time Rate/Dose/Volume Action   05/27/23 1451 10 mL  Given   heparin sodium (porcine) injection (Units)  Total dose: 4,000 Units  Date/Time Rate/Dose/Volume Action   05/27/23 1517 4,000 Units Given   iohexol (OMNIPAQUE) 350 MG/ML injection (mL)  Total volume: 70 mL Date/Time Rate/Dose/Volume Action   05/27/23 1539 70 mL Given   Heparin (Porcine) in NaCl 1000-0.9 UT/500ML-% SOLN (mL)  Total volume: 1,000 mL Date/Time Rate/Dose/Volume Action   05/27/23 1604 500 mL Given   1604 500 mL Given    Contrast     Administrations occurring from 1358 to 1605 on 05/27/23:  Medication Name Total Dose  iohexol (OMNIPAQUE) 350 MG/ML injection 70 mL   Radiation/Fluoro  Fluoro time: 16.3 (min) DAP: 21608 (mGycm2) Cumulative Air Kerma: 382 (mGy) Complications  Complications documented before study signed (05/27/2023  4:16 PM)   No complications were associated with this study.  Documented by Caralee Ates, RN - 05/27/2023  3:46 PM     Coronary Findings  Diagnostic Dominance: Right Left Main  Vessel is normal in caliber and large.    Left Anterior Descending  Previously placed Prox LAD to Mid LAD stent of unknown type is widely patent. The lesion is located proximal to the major branch.  Mid LAD lesion is 50% stenosed with 55% stenosed side branch in 2nd Diag. The lesion is located at the bifurcation and focal. The lesion was previously treated using a drug eluting stent and angioplasty between 1-2 years ago. Previously placed stent displays restenosis.    First Diagonal Branch  Vessel is small in size.    First Septal Branch  Vessel is small in size.    Second Diagonal Branch    Second Septal Branch  Vessel is small in size.    Third Diagonal Branch  Vessel is small in size.    Left Circumflex  Vessel is large. Vessel is angiographically normal.  Dist Cx lesion is 45% stenosed. The lesion is eccentric and smooth.    First Obtuse Marginal Branch  Vessel is small in size.    Second Obtuse Marginal Branch  Vessel is small  in size.    First Left Posterolateral Branch  Vessel is small in size.    Second Left Posterolateral Branch  Vessel is small in size.    Third Left Posterolateral Branch  Vessel is moderate in size. Vessel is angiographically normal.    Left Posterior Atrioventricular Artery  Vessel is angiographically normal.    Right Coronary Artery  The vessel exhibits minimal luminal irregularities.  Previously placed Mid RCA stent of unknown type is widely patent. Previously placed stent displays no restenosis. Widely patent    Acute Marginal Branch  Vessel is small in size.    Right Ventricular Branch  Vessel is small in size.    Right Posterior Descending Artery  Vessel is moderate in size. Vessel is angiographically normal. Wraparound PDA covers the distal anterior apex  RPDA lesion is 50% stenosed.    Right Posterior Atrioventricular Artery  Vessel is small in size.    First Right Posterolateral Branch  Vessel is small in size.    Intervention   No interventions have been documented.   Right Heart  Right Heart Pressures Upper limit of normal normal PAP mean of 22 mm agree LV EDP is normal.  Right Atrium Right atrial pressure is normal.   Wall Motion  No LV gram         Left Heart  Aortic Valve There is severe aortic valve stenosis. By Echocardiogram.   Coronary Diagrams  Diagnostic Dominance: Right  Intervention   Implants   No implant documentation  for this case.   Syngo Images   Show images for CARDIAC CATHETERIZATION Images on Long Term Storage   Show images for Jaamal, Farooqui to Procedure Log  Procedure Log    Hemodynamics  Pressures Phases Resting  Right     RA Mean  mmHg 3    RA A-Wave  mmHg 6    RA V-Wave  mmHg 5    RV  mmHg 27/2  Pulmonary     PA  mmHg 28/14 (22)    PCW Mean  mmHg 9.0    PCW A-Wave  mmHg 10.0    PCW V-Wave  mmHg 15.0    PAPi   4.7    Saturations Phases Resting    PA  % 70    Arterial  % 99    Hemo  Data  Flowsheet Row Most Recent Value  Fick Cardiac Output 5.61 L/min  Fick Cardiac Output Index 2.55 (L/min)/BSA  RA A Wave 6 mmHg  RA V Wave 5 mmHg  RA Mean 3 mmHg  RV Systolic Pressure 27 mmHg  RV Diastolic Pressure 2 mmHg  RV EDP 6 mmHg  PA Systolic Pressure 28 mmHg  PA Diastolic Pressure 14 mmHg  PA Mean 22 mmHg  PW A Wave 10 mmHg  PW V Wave 15 mmHg  PW Mean 9 mmHg  AO Systolic Pressure 119 mmHg  AO Diastolic Pressure 65 mmHg  AO Mean 91 mmHg  QP/QS 1  TPVR Index 8.61 HRUI  TSVR Index 35.63 HRUI  PVR SVR Ratio 0.15  TPVR/TSVR Ratio 0.24    Narrative & Impression  CLINICAL DATA:  Aortic valve replacement (TAVR), pre-op eval   EXAM: Cardiac TAVR CT   TECHNIQUE: The patient was scanned on a Siemens Force 192 slice scanner. A 120 kV retrospective scan was triggered in the descending thoracic aorta at 111 HU's. Gantry rotation speed was 270 msecs and collimation was .9 mm. The 3D data set was reconstructed in 5% intervals of the R-R cycle. Systolic and diastolic phases were analyzed on a dedicated work station using MPR, MIP and VRT modes. The patient received OMNIPAQUE IOHEXOL 350 MG/ML SOLN of contrast.   FINDINGS: Aortic Valve:   Tricuspid aortic valve with severely reduced cusp excursion. Severely thickened and severely calcified aortic valve cusps.   AV calcium score: 3744   Virtual Basal Annulus Measurements:   Maximum/Minimum Diameter: 31.5 x 24.7 mm   Perimeter: 86.8 mm   Area:  574 mm2   No significant LVOT calcifications.   Membranous septal length: 9 mm   Based on these measurements, the annulus would be suitable for a 29 mm Sapien 3 valve. Alternatively, Heart Team can consider 34 mm Evolut valve. Recommend Heart Team discussion for valve selection.   Sinus of Valsalva Measurements:   Non-coronary:  39 mm   Right - coronary:  39 mm   Left - coronary:  41 mm   Sinus of Valsalva Height:   Left: 25 mm   Right: 24 mm    Aorta: Conventional 3 vessel branch pattern of aortic arch.   Sinotubular Junction:  35 mm   Ascending Thoracic Aorta:  40 mm   Aortic Arch:  33 mm   Descending Thoracic Aorta:  29 mm   Coronary Artery Height above Annulus:   Left main: 17 mm   Right coronary: 16 mm   Coronary Arteries: Normal coronary origin. Right dominance. The study was performed without use of NTG and insufficient for plaque  evaluation. Prior PCI.   Optimum Fluoroscopic Angle for Delivery: LAO 12 CAU 10   OTHER:   Atria: Grossly normal.   Left atrial appendage: No thrombus.   Mitral valve: Grossly normal, mild mitral annular calcifications.   Pulmonary artery: Mildly dilated, 34 mm   Pulmonary veins: Normal anatomy.   IMPRESSION: 1. Tricuspid aortic valve with severely reduced cusp excursion. Severely thickened and severely calcified aortic valve cusps. 2. Aortic valve calcium score: 3744 3. Annulus area: 574 mm2, suitable for 29 mm Sapien 3 valve. No LVOT calcifications. Membranous septal length 9. 4. Sufficient coronary artery heights from annulus. 5. Optimum fluoroscopic angle for delivery: LAO 12 CAU 10   Electronically Signed: By: Weston Brass M.D. On: 06/03/2023 08:42     Narrative & Impression  CLINICAL DATA:  Aortic valve replacement, TAVR, preoperative evaluation.   EXAM: CT ANGIOGRAPHY CHEST, ABDOMEN AND PELVIS   TECHNIQUE: Non-contrast CT of the chest was initially obtained.   Multidetector CT imaging through the chest, abdomen and pelvis was performed using the standard protocol during bolus administration of intravenous contrast. Multiplanar reconstructed images and MIPs were obtained and reviewed to evaluate the vascular anatomy.   RADIATION DOSE REDUCTION: This exam was performed according to the departmental dose-optimization program which includes automated exposure control, adjustment of the mA and/or kV according to patient size and/or use of iterative  reconstruction technique.   CONTRAST:  OMNIPAQUE IOHEXOL 350 MG/ML SOLN   COMPARISON:  03/27/2021, 09/07/2019.   FINDINGS: CTA CHEST FINDINGS   Cardiovascular: The heart is borderline enlarged and there is a trace pericardial effusion. Multi-vessel coronary artery calcifications are noted. There is calcification of the aortic valve. There is atherosclerotic calcification of the aorta with aneurysmal dilatation of the ascending aorta measuring 4.0 cm. The pulmonary trunk is normal in caliber. No dissection is seen.   Mediastinum/Nodes: No mediastinal, hilar, or axillary lymphadenopathy. The thyroid gland, trachea, and esophagus are within normal limits.   Lungs/Pleura: Stable fibrotic changes and ground-glass attenuation is present in the lungs bilaterally. No consolidation, effusion, or pneumothorax is seen. Mild apical pleural and parenchymal thickening is present on the right.   Musculoskeletal: Degenerative changes are present in the thoracic spine. No acute osseous abnormality is seen.   Review of the MIP images confirms the above findings.   CTA ABDOMEN AND PELVIS FINDINGS   VASCULAR   Aorta: Normal caliber aorta without dissection, vasculitis or significant stenosis. Aortic atherosclerosis. The abdominal aorta measures up to 2.5 cm in diameter.   Celiac: Patent without evidence of aneurysm, dissection, vasculitis or significant stenosis.   SMA: Patent without evidence of aneurysm, dissection, vasculitis or significant stenosis.   Renals: Both renal arteries are patent without evidence of aneurysm, dissection, vasculitis, fibromuscular dysplasia or significant stenosis.   IMA: Patent without evidence of aneurysm, dissection, vasculitis or significant stenosis.   Inflow: Patent without evidence of aneurysm, dissection, vasculitis or significant stenosis.   Veins: No obvious venous abnormality within the limitations of this arterial phase study.    Review of the MIP images confirms the above findings.   NON-VASCULAR   Hepatobiliary: No focal liver abnormality is seen. No gallstones, gallbladder wall thickening, or biliary dilatation.   Pancreas: Unremarkable. No pancreatic ductal dilatation or surrounding inflammatory changes.   Spleen: Normal size. A small hypodensity is present in the anterior aspect of the spleen, likely cyst or hemangioma.   Adrenals/Urinary Tract: No adrenal nodule or mass. The kidneys enhance symmetrically. Nonobstructive renal calculi are present bilaterally.  There is no hydronephrosis. The bladder is unremarkable.   Stomach/Bowel: Small hiatal hernia is noted. Stomach is within normal limits. Appendix is surgically absent. No evidence of bowel wall thickening, distention, or inflammatory changes. No free air or pneumatosis is seen. Scattered diverticula are present along the colon without evidence of diverticulitis.   Lymphatic: No abdominal or pelvic lymphadenopathy.   Reproductive: Prostate is unremarkable.   Other: No abdominopelvic ascites. A small fat containing umbilical hernia is present.   Musculoskeletal: Degenerative changes are present in the lumbar spine. Bilateral pars defects are noted at L5 with mild anterolisthesis at L5-S1. No acute osseous abnormality is seen.   Review of the MIP images confirms the above findings.   IMPRESSION: 1. Aortic atherosclerosis with mild aneurysmal dilatation of the ascending aorta measuring 4.0 cm. Recommend annual imaging followup by CTA or MRA. This recommendation follows 2010 ACCF/AHA/AATS/ACR/ASA/SCA/SCAI/SIR/STS/SVM Guidelines for the Diagnosis and Management of Patients with Thoracic Aortic Disease. Circulation. 2010; 121: X914-N829. Aortic aneurysm NOS (ICD10-I71.9) 2. Coronary artery calcifications. 3. Stable fibrotic and interstitial changes in the lungs bilaterally. No acute infiltrate is seen. 4. Small hiatal hernia. 5. Bilateral  nephrolithiasis. 6. Diverticulosis without diverticulitis.     Electronically Signed   By: Thornell Sartorius M.D.   On: 06/03/2023 07:48      Impression:  This 78 year old gentleman has stage D, severe, symptomatic aortic stenosis with NYHA class III-IV symptoms of exertional shortness of breath and fatigue as well as left-sided chest discomfort consistent with chronic diastolic congestive heart failure.  He presented to the office today acutely short of breath although his oxygen saturation has been 100% on room air and his vital signs been stable.  I have personally reviewed his 2D echocardiogram, cardiac catheterization, and CTA studies.  His echocardiogram shows a trileaflet aortic valve with severe calcification and restricted leaflet mobility.  The mean gradient is 42 mmHg with a valve area of 0.88 cm by VTI.  Left ventricular systolic function is normal.  Cardiac catheterization showed about 50% mid LAD in-stent restenosis involving the ostium of a moderate-sized second diagonal branch.  Right heart pressures were within normal limits.  I agree that aortic valve replacement is indicated in this patient for relief of his symptoms and to prevent progressive left ventricular deterioration.  I think TAVR would be the best option for treating him given his age and comorbidities.  He is acutely short of breath and very fatigued today after walking into the office and his wife said the symptoms have been worsening over the past few weeks and have prompted admission to [facility].  He does have some interstitial lung disease on low-dose prednisone at home as well as a moderate LAD restenosis that I would not expect to be causing the symptoms.  His CTA of the chest yesterday was not time to look at the pulmonary arteries but I do not see any signs of emboli within the main pulmonary artery or its branches as well as the lobar pulmonary arteries.  His oxygen saturations have been 100% on room air which would  make pulmonary emboli unlikely.  I think it would be best to directly admit him to the hospital today for continued observation and management for severe symptomatic aortic stenosis.  His gated cardiac CTA shows anatomy suitable for TAVR using a 29 mm SAPIEN 3 valve.  His abdominal and pelvic CTA shows adequate pelvic vascular anatomy to allow transfemoral insertion.  The patient and his wife were counseled at length regarding  treatment alternatives for management of severe symptomatic aortic stenosis. The risks and benefits of surgical intervention has been discussed in detail. Long-term prognosis with medical therapy was discussed. Alternative approaches such as conventional surgical aortic valve replacement, transcatheter aortic valve replacement, and palliative medical therapy were compared and contrasted at length. This discussion was placed in the context of the patient's own specific clinical presentation and past medical history. All of their questions have been addressed.   Following the decision to proceed with transcatheter aortic valve replacement, a discussion was held regarding what types of management strategies would be attempted intraoperatively in the event of life-threatening complications, including whether or not the patient would be considered a candidate for the use of cardiopulmonary bypass and/or conversion to open sternotomy for attempted surgical intervention.  I think he would be a candidate for emergent sternotomy to manage any intraoperative complications.  The patient is aware of the fact that transient use of cardiopulmonary bypass may be necessary. The patient has been advised of a variety of complications that might develop including but not limited to risks of death, stroke, paravalvular leak, aortic dissection or other major vascular complications, aortic annulus rupture, device embolization, cardiac rupture or perforation, mitral regurgitation, acute myocardial infarction,  arrhythmia, heart block or bradycardia requiring permanent pacemaker placement, congestive heart failure, respiratory failure, renal failure, pneumonia, infection, other late complications related to structural valve deterioration or migration, or other complications that might ultimately cause a temporary or permanent loss of functional independence or other long term morbidity. The patient provides full informed consent for the procedure as described and all questions were answered.      Plan:  He is being directly admitted to the cardiology service into [facility] today.  He will be stabilized and we will plan transfemoral TAVR on Tuesday, 06/10/2023.   I spent 60 minutes performing this consultation and > 50% of this time was spent face to face counseling and coordinating the care of this patient's severe symptomatic aortic stenosis.   [name], MD 06/04/2023 5:06 PM

## 2023-06-04 NOTE — Plan of Care (Signed)
  Problem: Clinical Measurements: Goal: Will remain free from infection Outcome: Progressing Goal: Diagnostic test results will improve Outcome: Progressing Goal: Cardiovascular complication will be avoided Outcome: Progressing   Problem: Activity: Goal: Risk for activity intolerance will decrease Outcome: Progressing   Problem: Nutrition: Goal: Adequate nutrition will be maintained Outcome: Progressing   Problem: Coping: Goal: Level of anxiety will decrease Outcome: Progressing

## 2023-06-04 NOTE — H&P (Addendum)
 Cardiology Admission History and Physical   Patient ID: Kenneth [name] MRN: [id number]; DOB: Aug 19, 1945   Admission date: 06/04/2023  PCP:  Clinic, [facility] Providers Cardiologist:  Bryan Lemma, MD        Chief Complaint:  severe shortness of breath  Patient Profile:   Kenneth [name] is a 78 y.o. male with a history of CAD s/p multiple interventions (DES to RCA and LAD in 2018, DES to distal LAD and PTCA to ostial D1 in 10/2020, and PTCA with kissing balloon technique of in-stent restenosis of LAD-D2 bifurcation site in 06/2021), chronic HFpEF, atrial flutter s/p ablation in 2006 and atrial fibrillation s/p ablation in 01/2014 on Eliquis, severe aortic stenosis, interstitial lung disease, hypertension, hyperlipidemia, type 2 diabetes mellitus who is being seen 06/04/2023 for the evaluation of shortness of breath.  History of Present Illness:   Kenneth [name] is a 78 year old male with the above history who is followed by Dr. Herbie Baltimore. Recent Echo on 05/14/2023 for follow-up of aortic stenosis showed LVEF of 60-65% with normal wall motion, mild LVH, and grade 2 diastolic dysfunction as well as severe aortic stenosis with mean gradient of 42.0 mmHg. Outpatient R/LHC on 05/27/2023 showed widely patent stents to LAD and RCA with 50% in-stent restenosis of mid LAD at previous PTCA site. Right heart pressures were relatively normal. Cardiac output 5.61 and cardiac index 2.55. Plan was for outpatient follow-up in the Structural Heart Clinic.  Before being seen by the Structural Heart Team, he was admitted from 06/02/2023 to 06/03/2023 for persistent fatigue, atypical chest pain, and dyspnea. High-sensitivity troponin negative. BNP normal. He did not appear to volume overloaded and shortness of breath was not felt to be solely cardiac in nature. He was seen by Dr. Excell Seltzer with the Structural Heart Team. Gated CTA showed tricuspid aortic valve with severely reduced cusp  excursion, aortic valve calcium score of 3744, and an annular area of 574 mm, suitable for a 29 mm Edwards SAPIEN 3 valve. TAVR was recommended. He was discharged with plans to see Dr. Laneta Simmers today.  He was seen by Dr. Laneta Simmers today in the office for surgical consultation and was noted to be acute short of breath. Therefore, he was direct admitted to the hospital with plans to keep him inpatient until his TAVR next week.  At the time of this evaluation, patient is resting comfortably in no acute distress. He feels like he breathing more comfortably on 3L of O2. He describes significant dyspnea with only minimal activity such as walking a couple of steps. Wife also reports some dyspnea at rest and some orthopnea which is "fairly new" since Christmas. No PND. He continues to have some atypical pin point chest pain on the left side of his chest. He reports occasional skipped beats but no significant/ sustained palpitations. Wife states he has some lightheadedness/ dizziness with position changes. No syncope. He has an intermittent cough but no recent fevers or illnesses. No abnormal bleeding in urine or stools.   Past Medical History:  Diagnosis Date   Arthritis    CAD S/P percutaneous coronary angioplasty 06/28/2016   Cardiologist- Dr. Herbie Baltimore: 06/28/16 -- [facility] PCI (Synergy DES 2.75 x 20 mm); 07/10/16: Staged p-m LAD PCI (FFR 0.75) - DES PCI (Synergy DES 3.5 x 20 mm); 10/11/2020: mid-distal LAD 70% (FFR 0.75) DES PCI (Synergy DES 2.5 mm x 32 mm - overlaps prox-mid stent, tapered post-dilation 3.7 - 2.6 mm.;  06/2021 - Cath  with severe LAD-D2 bifurcation ISR -- PTCA / kissing balloon - reduced LAD to ~25% & D2 to 40%   Chronic dryness of both eyes    Chronic heart failure with preserved ejection fraction (HFpEF) (HCC) 05/22/2013   Diverticulosis of colon    Epiretinal membrane    "epiretinal attachment" (06/28/2016) 10-14-2017 per pt oringinally in both , now only unilateral    GERD (gastroesophageal reflux  disease)    Hiatal hernia    History of adenomatous polyp of colon    History of concussion 08/23/2017   w/o loc--- per pt no residual   History of kidney stones    History of rheumatic fever 1958   NO Evidence of Vavlular disease -> only mild Aortic Sclerosis   History of skin cancer    excision leg;  froze the left arm--- unsure BCC or SCC    Hyperlipidemia    Hypertension    ILD (interstitial lung disease) (HCC)    pulmologist-  dr Vassie Loll--  secondary to methotrexate use --- last PFTs 04-25-2015  mild restriction   Iron deficiency anemia    Mild obstructive sleep apnea    10-04-2019 per pt  had a sleep study years ago , was told no cpap recommended pt denies    Nephrolithiasis    CT 09-07-2019 bilateral nonobstructive stones   Paroxysmal atrial fibrillation (HCC) 2015   a. 11/2003 Tikosyn initiated - subsequently d/c'd;  b. 2006 RFCA for Afib @ [facility];  c. 09/2011 Echo: EF 60-65%, Gr 2 DD;  d. DCCV 10/2011 , 04/2012, 12/ 2014, & 02/ 2015;  e. RFCA 01-13-2014   Plaque psoriasis 1969   followed by rheumatologist  and dermatologist @ [facility] [place]   PONV (postoperative nausea and vomiting)    Right ureteral stone    S/P drug eluting coronary stent placement    06-28-2016  x1 to mRCA;  07-10-2016 x1 to mLAD   Scarring of lung    Seasonal allergies    Short-term memory loss    Type 2 diabetes mellitus (HCC)    followed by pcp   Ureteral calculus, right    Wears hearing aid in both ears     Past Surgical History:  Procedure Laterality Date   APPENDECTOMY  1996   ATRIAL FIBRILLATION ABLATION N/A 01/13/2014   repeat PVI, also left atrial ablation performed with successfull ablation of LA flutter by Dr Johney Frame   ATRIAL FIBRILLATION ABLATION  08/2004   Dr Delena Serve at [facility])   CARDIAC STRESS PET  03/12/2022   Normal perfusion study/low risk.  Normal LVEF and normal LVEF preserved.  Suspect reduced myocardial blood flow reserve related to microvascular disease.  No  evidence of infarction or ischemia.  Resting EF 48%.  Stress EF 54%.  Mildly enlarged LV. Asc Ao 4.1 cm--stable.  Also stable mosaic attenuation in the lungs no change.   CARDIOVERSION N/A 05/22/2013   Procedure: CARDIOVERSION;  Surgeon: Hillis Range, MD;  Location: [facility] OR;  Service: Cardiovascular;  Laterality: N/A;   CARDIOVERSION N/A 10/23/2011   Procedure: CARDIOVERSION;  Surgeon: Duke Salvia, MD;  Location: [facility] CATH LAB;  Service: Cardiovascular;  Laterality: N/A;   CARDIOVERSION N/A 05/01/2012   Procedure: CARDIOVERSION;  Surgeon: Duke Salvia, MD;  Location: [facility] CATH LAB;  Service: Cardiovascular;  Laterality: N/A;   CARPAL TUNNEL RELEASE Right 1980s   CATARACT EXTRACTION W/ INTRAOCULAR LENS  IMPLANT, BILATERAL  2012  approx.   CORONARY BALLOON ANGIOPLASTY N/A 07/04/2021  Procedure: CORONARY BALLOON ANGIOPLASTY;  Surgeon: Marykay Lex, MD;  Location: [facility] INVASIVE CV LAB;  Service: Cardiovascular;; scoring balloon and an [place] balloon PTCA of LAD; with kissing balloon PTCA of LAD-D2 bifurcation stented segment reducing LAD to 20 % ISR and ostial D2 to 40%.   CORONARY PRESSURE/FFR STUDY N/A 06/28/2016   Procedure: Intravascular Pressure Wire/FFR Study;  Surgeon: Marykay Lex, MD;  Location: [facility] INVASIVE CV LAB;  Service: Cardiovascular: FFR of mLAD ~65% lesion = 0.75 post --> Staged PCI   CORONARY PRESSURE/FFR STUDY N/A 10/11/2020   Procedure: INTRAVASCULAR PRESSURE WIRE/FFR STUDY;  Surgeon: Marykay Lex, MD;  Location: [facility] INVASIVE CV LAB;  Service: Cardiovascular;  Laterality: LAD mid-distal just after D2 --> FFR 0.75 SIGNIFICANT => PCI   CORONARY STENT INTERVENTION N/A 06/28/2016   Procedure: Coronary Stent Intervention;  Surgeon: Marykay Lex, MD;  Location: [facility] INVASIVE CV LAB;  Service: Cardiovascular;  Laterality: mRCA PCI -Synergy DES 2.75 m x 20 mm (3.1 mm)   CORONARY STENT INTERVENTION N/A 07/10/2016   Procedure: Coronary Stent Intervention;  Surgeon: Marykay Lex, MD;   Location: [place] INVASIVE CV LAB;  Service: Cardiovascular: mLAD PCI Synergy DES 3.5 mm x 20 mm)   CORONARY STENT INTERVENTION N/A 10/11/2020   Procedure: CORONARY STENT INTERVENTION;  Surgeon: Marykay Lex, MD;  Location: [facility] INVASIVE CV LAB;  Service: Cardiovascular: mid-distal LAD ~70% (FFR 0.75): SYNERGY DES 2.5 MM X 32 MM (overlaps prox-mid Stent from 07/2016) - Tapered post-dilation 3.7 mm ->2.6 mm; Jailed D2 PTCA reduced 60% to 40%.   CYSTOSCOPY WITH RETROGRADE PYELOGRAM, URETEROSCOPY AND STENT PLACEMENT Right 10/16/2017   Procedure: CYSTOSCOPY WITH RIGHT RETROGRADE RIGHT URETEROSCOPY AND STONE BASKET EXTRACTION;  Surgeon: Bjorn Pippin, MD;  Location: [facility];  Service: Urology;  Laterality: Right;   CYSTOSCOPY WITH RETROGRADE PYELOGRAM, URETEROSCOPY AND STENT PLACEMENT Right 10/08/2019   Procedure: CYSTOSCOPY WITH URETEROSCOPY; RETROGRADE PYELOGRAM; STONE BASKET EXTRACTION;  STENT PLACEMENT;  Surgeon: Malen Gauze, MD;  Location: [facility];  Service: Urology;  Laterality: Right;  1 HR   CYSTOSCOPY WITH RETROGRADE PYELOGRAM, URETEROSCOPY AND STENT PLACEMENT Right 03/28/2020   Procedure: CYSTOSCOPY WITH RIGHT RETROGRADE PYELOGRAM, URETEROSCOPY WITH HOLMIUM LASER AND STENT PLACEMENT;  Surgeon: Bjorn Pippin, MD;  Location: [facility];  Service: Urology;  Laterality: Right;   CYSTOSCOPY WITH RETROGRADE PYELOGRAM, URETEROSCOPY AND STENT PLACEMENT Left 10/22/2021   Procedure: CYSTOSCOPY WITH LEFT RETROGRADE URETEROSCOPY WIHT HOLMIUM LASERAND STENT PLACEMENT;  Surgeon: Bjorn Pippin, MD;  Location: [facility];  Service: Urology;  Laterality: Left;   CYSTOSCOPY/RETROGRADE/URETEROSCOPY/STONE EXTRACTION WITH BASKET Right 10/27/2017   Procedure: CYSTOSCOPY/RETROGRADE/URETEROSCOPY/STONE EXTRACTION WITH BASKET/URETERAL STENT PLACEMENT and laser;  Surgeon: Bjorn Pippin, MD;  Location: [facility];  Service: Urology;  Laterality: Right;   CYSTOSCOPY/URETEROSCOPY/HOLMIUM LASER/STENT  PLACEMENT Right 01/03/2023   Procedure: CYSTOSCOPY RIGHT RETROGRADE URETEROSCOPY/HOLMIUM LASER/STENT PLACEMENT;  Surgeon: Bjorn Pippin, MD;  Location: [facility];  Service: Urology;  Laterality: Right;   EXTRACORPOREAL SHOCK WAVE LITHOTRIPSY Left 08/10/2020   Procedure: EXTRACORPOREAL SHOCK WAVE LITHOTRIPSY (ESWL);  Surgeon: Crist Fat, MD;  Location: [facility];  Service: Urology;  Laterality: Left;   EXTRACORPOREAL SHOCK WAVE LITHOTRIPSY Right 12/16/2022   Procedure: RIGHT EXTRACORPOREAL SHOCK WAVE LITHOTRIPSY (ESWL);  Surgeon: Bjorn Pippin, MD;  Location: [facility];  Service: Urology;  Laterality: Right;   HOLMIUM LASER APPLICATION Right 10/27/2017   Procedure: POSSIBLE HOLMIUM LASER APPLICATION;  Surgeon: Bjorn Pippin, MD;  Location: [facility];  Service: Urology;  Laterality: Right;   HOLMIUM LASER APPLICATION Right 10/08/2019   Procedure: HOLMIUM LASER APPLICATION;  Surgeon: Malen Gauze, MD;  Location: [facility];  Service: Urology;  Laterality: Right;   LEFT HEART CATH AND CORONARY ANGIOGRAPHY  06/30/2003   Dr. Riley Kill: NO Significant/Obstructive CAD; Preseved LVEF (for Recurrent Afib)   NASAL SINUS SURGERY     x 2   NM MYOVIEW LTD  02/11/2021   Lexiscan:EF 55-65%.  Normal function.  No ischemia or infarction.  No EKG changes noted.   PROXIMAL INTERPHALANGEAL FUSION (PIP) Left 01-05-2001    dr graves   correction clawtoe and extensor tendon lengthening   RIGHT/LEFT HEART CATH AND CORONARY ANGIOGRAPHY N/A 06/28/2016   Procedure: Right/Left Heart Cath and Coronary Angiography;  Surgeon: Marykay Lex, MD;  Location: [facility] INVASIVE CV LAB;  Service: Cardiovascular: mRCA 99% (TIMI 2), mLAD ~65% (FFR 0.75), OM3 55%. mild Pulm HTN. Mod LVEDP elevation. EF 55-65%.   RIGHT/LEFT HEART CATH AND CORONARY ANGIOGRAPHY N/A 10/11/2020   Procedure: RIGHT/LEFT HEART CATH AND CORONARY ANGIOGRAPHY;  Surgeon: Marykay Lex, MD;  Location: [facility] INVASIVE CV  LAB;; Patent DES in mRCA & p-mLAD. CULPRIT = m-d LAD 70% (just after D1 w/ Ost 60%) FFR 0.75 => DES PCI m-dLAD & PTCA ost D1 (overlapping prior stent distally & crossing D2 w/  Ost D2 post PTCA). RIGHT HEART CATH PRESSURES,  & CO-CI ARE NORMAL. NO PULM HTN   RIGHT/LEFT HEART CATH AND CORONARY ANGIOGRAPHY N/A 07/04/2021   Procedure: RIGHT/LEFT HEART CATH AND CORONARY ANGIOGRAPHY;  Surgeon: Marykay Lex, MD;  Location: [facility] INVASIVE CV LAB;  Service: Cardiovascular;; moderately elevated LVEDP of 20 mmHg, and PCWP-28 mmHg -> mean PAP of 24 mmHg.  Moderate AS-mean gradient 33 mmHg.  (Similar to Echo 26 mmHg); borderline low BP.  CO-CI 9.2 and 4.0.. Stable CAD W/ progression of ISR of LAD stent at D2 => PCI   RIGHT/LEFT HEART CATH AND CORONARY ANGIOGRAPHY N/A 05/27/2023   Procedure: RIGHT/LEFT HEART CATH AND CORONARY ANGIOGRAPHY;  Surgeon: Marykay Lex, MD;  Location: [facility] INVASIVE CV LAB;  Service: Cardiovascular;  Laterality: N/A;   ROTATOR CUFF REPAIR Bilateral last one 2014   TONSILLECTOMY  age 3   TRANSTHORACIC ECHOCARDIOGRAM  09/29/2020   EF > 75%, Hyperdynamic. No RWMA. Mild LVH. ~ DD with mild LA dilation. Mild AI. Normal RV size & fxn, ? Dilated IVC c/w CVP ~ 15 mHg.   TRANSTHORACIC ECHOCARDIOGRAM  04/12/2021   EF estimated 65%.  Normal LV size and function.  No RWMA.  Mild LVH.  GRII DD.  Elevated LVEDP (c/w HFpEF).  Normal RV size.  Mildly elevated PAP estimated 36 mmHg.  Mild MR.  Mild AS (mean gradient 26 mmHg.  DI 0.32.  Aortic root 40 mm, ascending aorta 41 mm.   URETEROSCOPIC LASER LITHOTRIPSY STONE EXTRACTIONS/  STENT PLACEMENT Bilateral 09-10-2007   dr Annabell Howells  [facility]   per pt has had several prior ureteroscopic stone extraction since age 80 , last one 09-10-2007   Zio patch monitor:  02/2021   Probably Sinus Rhythm: Heart rate 56-146 bpm, average 83 bpm.  Frequent PACs noted with rare isolated PVCs.  Multiple episodes of PAT/atrial runs-longest 23 seconds average heart rate 95 bpm,  fastest 5 beats at a max rate of 231 bpm.  No evidence of A-fib or atrial flutter.  No prolonged or sustained SVT/PAT.     Medications Prior to Admission: Prior to Admission medications   Medication Sig Start  Date End Date Taking? Authorizing Provider  acetaminophen (TYLENOL) 500 MG tablet Take 1,000 mg by mouth every 6 (six) hours as needed for mild pain (pain score 1-3) or moderate pain (pain score 4-6).    [provider]  Adalimumab-bwwd (HADLIMA) 40 MG/0.8ML SOSY Inject 40 mg into the skin every 14 (fourteen) days.    [provider]  apixaban (ELIQUIS) 5 MG TABS tablet Take 1 tablet (5 mg total) by mouth 2 (two) times daily. 01/12/21   Fenton, Clint R, PA  Calcium Carbonate-Vit D-Min (CALCIUM 1200 PO) Take 1 tablet by mouth at bedtime.    [provider]  cetirizine (ZYRTEC) 10 MG tablet Take 10 mg by mouth in the morning.    [provider]  Dorzolamide HCl-Timolol Mal PF 2-0.5 % SOLN Place 1 drop into both eyes at bedtime.    [provider]  empagliflozin (JARDIANCE) 25 MG TABS tablet Take 12.5 mg by mouth daily. 12/26/21   [provider]  ferrous sulfate 325 (65 FE) MG tablet Take 325 mg by mouth every Monday, Wednesday, and Friday.    Ronney Asters, NP  furosemide (LASIX) 20 MG tablet Take 20 mg by mouth 2 (two) times a week. Monday and Thursday    [provider]  galantamine (RAZADYNE ER) 24 MG 24 hr capsule Take 24 mg by mouth in the morning. 08/30/20   [provider]  hydrOXYzine (ATARAX) 10 MG tablet Take 10 mg by mouth at bedtime as needed for anxiety or itching. Patient not taking: Reported on 06/04/2023 04/21/23   [provider]  ibuprofen (ADVIL) 200 MG tablet Take 200 mg by mouth every 6 (six) hours as needed for moderate pain (pain score 4-6).    [provider]  memantine (NAMENDA) 10 MG tablet Take 20 mg by mouth at bedtime. 08/30/20   [provider]  metFORMIN  (GLUCOPHAGE-XR) 500 MG 24 hr tablet Take 500 mg by mouth at bedtime.    [provider]  Multiple Vitamins-Minerals (MULTIVITAMIN WITH MINERALS) tablet Take 1 tablet by mouth daily.    [provider]  nitroGLYCERIN (NITROSTAT) 0.4 MG SL tablet Place 1 tablet (0.4 mg total) under the tongue every 5 (five) minutes as needed. 07/04/21   Arty Baumgartner, NP  omeprazole (PRILOSEC OTC) 20 MG tablet Take 20 mg by mouth daily as needed (Acid reflux).    [provider]  predniSONE (DELTASONE) 5 MG tablet Take 1 tablet by mouth once daily with breakfast 03/07/23   Oretha Milch, MD  ranolazine (RANEXA) 500 MG 12 hr tablet Take 1 tablet (500 mg total) by mouth 2 (two) times daily. 11/11/22   Marykay Lex, MD  rosuvastatin (CRESTOR) 40 MG tablet Take 1 tablet (40 mg total) by mouth daily. Patient taking differently: Take 40 mg by mouth at bedtime. 12/28/21   Marykay Lex, MD  sertraline (ZOLOFT) 100 MG tablet Take by mouth. Patient not taking: Reported on 06/04/2023 05/05/23   [provider]  tamsulosin (FLOMAX) 0.4 MG CAPS capsule Take 1 capsule by mouth once daily Patient taking differently: Take 0.4 mg by mouth daily as needed (kidney stones). 03/13/21   McKenzie, Mardene Celeste, MD     Allergies:    Allergies  Allergen Reactions   Hydrocodone Shortness Of Breath    In combination with decongestants   Other Palpitations and Other (See Comments)    ALL DECONGESTANTS - CAUSE PALPITATIONS; THROWS HEART RHYTHM OUT OF BALANCE  Oxycodone Shortness Of Breath   Sudafed [Pseudoephedrine] Palpitations   Tramadol Shortness Of Breath and Rash    flushing   Lopid [Gemfibrozil] Other (See Comments)    Dizziness      Social History:   Social History   Socioeconomic History   Marital status: Married    Spouse name: Not on file   Number of children: 1   Years of education: Not on file   Highest education level: Not on file  Occupational History   Occupation:  Retired Psychologist, sport and exercise  Tobacco Use   Smoking status: Former    Types: Cigars    Quit date: 09/29/1997    Years since quitting: 25.6   Smokeless tobacco: Former    Types: Snuff, Chew   Tobacco comments:    Form smoker and chew tobacco 01/25/2021  Vaping Use   Vaping status: Never Used  Substance and Sexual Activity   Alcohol use: No   Drug use: No   Sexual activity: Not on file  Other Topics Concern   Not on file  Social History Narrative   Pt lives in [place] with spouse.     Retired.    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 -> subsequently worked in [employer] (seconded to [employer]).       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        Had been very active as an Charity fundraiser"  - now doing his best to "stay out of this mess of politics" -- has declined efforts to get him to run for office.      Now is primary caregiver for his wife Kenneth [name]) - who has metastatic CA & has had CVA x 2.  Pretty much "total care".  Very emotionally taxing.      He has plans to build homes for all of his children & grandchildren on their property - to ensure they all have a place to live - Not transferable   Social Drivers of Health   Financial Resource Strain: Not on file  Food Insecurity: No Food Insecurity (06/03/2023)   Hunger Vital Sign    Worried About Running Out of Food in the Last Year: Never true    Ran Out of Food in the Last Year: Never true  Transportation Needs: No Transportation Needs (06/03/2023)   PRAPARE - Administrator, Civil Service (Medical): No    Lack of Transportation (Non-Medical): No  Physical Activity: Not on file  Stress: Not on file  Social Connections: Moderately Integrated (06/03/2023)   Social Connection and Isolation Panel [NHANES]    Frequency of Communication with Friends and Family: More than three times a week    Frequency of Social Gatherings with  Friends and Family: Three times a week    Attends Religious Services: 1 to 4 times per year    Active Member of Clubs or Organizations: No    Attends Banker Meetings: Never    Marital Status: Married  Catering manager Violence: Not At Risk (06/03/2023)   Humiliation, Afraid, Rape, and Kick questionnaire    Fear of Current or Ex-Partner: No    Emotionally Abused: No    Physically Abused: No    Sexually Abused: No    Family History:   The patient's family history includes Breast cancer in his mother; Colon cancer in his mother; Coronary artery disease in his mother and paternal grandfather;  Heart disease in his maternal grandmother; Stroke in his paternal grandfather. There is no history of Esophageal cancer, Stomach cancer, or Rectal cancer.    ROS:  Please see the history of present illness.   Physical Exam/Data:  There were no vitals filed for this visit. No intake or output data in the 24 hours ending 06/04/23 1639    06/04/2023    2:44 PM 06/03/2023   11:49 AM 05/27/2023    9:33 AM  Last 3 Weights  Weight (lbs) 204 lb 200 lb 6.4 oz 200 lb  Weight (kg) 92.534 kg 90.9 kg 90.719 kg     There is no height or weight on file to calculate BMI.  General: 78 y.o. Caucasian male resting comfortably in no acute distress. HEENT: Normocephalic and atraumatic. Sclera clear.  Neck: Supple.  No JVD. Heart: RRR. III/ VI systolic murmur. No gallops or rubs.. Radial and distal pedal pulses 2+ and equal bilaterally. Lungs: No increased work of breathing. Decreased breath sounds noted bilateral with faint course crackles noted in right lung base.  Abdomen: Soft, non-distended, and non-tender to palpation.  Extremities: No lower extremity edema.    Skin: Warm and dry. Neuro: Alert and oriented x3. No focal deficits. Psych: Normal affect. Responds appropriately.    EKG:  The ECG from 06/02/2023 was personally reviewed and demonstrates normal sinus rhythm with underlying artifact but  no acute ischemic changes.  Relevant CV Studies:  Echocardiogram 05/14/2023: Impressions:  1. Left ventricular ejection fraction, by estimation, is 60 to 65%. The  left ventricle has normal function. The left ventricle has no regional  wall motion abnormalities. There is mild concentric left ventricular  hypertrophy. Left ventricular diastolic  parameters are consistent with Grade II diastolic dysfunction  (pseudonormalization).   2. Right ventricular systolic function is normal. The right ventricular  size is normal. There is normal pulmonary artery systolic pressure. The  estimated right ventricular systolic pressure is 34.6 mmHg.   3. Left atrial size was moderately dilated.   4. The mitral valve is degenerative. Mild mitral valve regurgitation. No  evidence of mitral stenosis. Moderate mitral annular calcification.   5. The tricuspid valve is abnormal. Tricuspid valve regurgitation is  moderate.   6. The aortic valve is tricuspid. There is severe calcifcation of the  aortic valve. Aortic valve regurgitation is mild. Severe aortic valve  stenosis. Aortic valve area, by VTI measures 0.88 cm. Aortic valve mean  gradient measures 42.0 mmHg.   7. Aortic dilatation noted. There is mild dilatation of the ascending  aorta, measuring 41 mm.   8. The inferior vena cava is normal in size with greater than 50%  respiratory variability, suggesting right atrial pressure of 3 mmHg.  _______________  Right/ Left Cardiac Catheterization 05/27/2023:   Previously placed Prox LAD to Mid LAD stent of unknown type is  widely patent the second overlapping mid LAD stent has 50% in-stent restenosis (at the previous PTCA site) with 55% stenosed side branch in 2nd Diag.   Dist Cx lesion is 45% stenosed.   Previously placed Mid RCA stent of unknown type is  widely patent.   RPDA lesion is 50% stenosed.   Normal Right Heart Cath Numbers/Pressures    Severe Aortic Stenosis by Echocardiogram (able to cross  with wire but not with catheter due to tortuosity in the innominate artery. Widely patent RCA stents with left 50% ISR of the LAD stent at the previous PTCA site-Diag bifurcation with 55% ostial Diag stenosis (does not appear  to be flow-limiting) here Relatively Normal Right Heart Cath Pressures and Measurements: -RAP 5 mmHg, RV P-EDP 27/2-6 mmHg; PAP-mean 28/14-22 mmHg, PCWP 10 million mercury.  -AO P-MAP 119/65-91 mmHg. -Ao sat 99%, PA sat 70%; Fick Cardiac Output 5.61-2.55.    Recommendations: Discharge home today after bedrest.  Has plan to follow-up with Dr. Excell Seltzer in the Aortic Valve Clinic for TAVR evaluation Reassess symptoms after TAVR, if still symptomatic, could consider ischemic evaluation of the LAD with a Myoview stress test. Restart Eliquis p.m. 05/27/2023; restart Jardiance 05/28/2023; hold metformin 48 hours post cath _______________  CT Coronary 06/03/2023: Impressions: 1. Tricuspid aortic valve with severely reduced cusp excursion. Severely thickened and severely calcified aortic valve cusps. 2. Aortic valve calcium score: 3744 3. Annulus area: 574 mm2, suitable for 29 mm Sapien 3 valve. No LVOT calcifications. Membranous septal length 9. 4. Sufficient coronary artery heights from annulus. 5. Optimum fluoroscopic angle for delivery: LAO 12 CAU 10   Laboratory Data:  High Sensitivity Troponin:   Recent Labs  Lab 06/02/23 1815 06/02/23 2015  TROPONINIHS 9 9      Chemistry Recent Labs  Lab 06/02/23 1815 06/03/23 0438  NA 137 138  K 4.2 3.7  CL 105 108  CO2 21* 24  GLUCOSE 142* 116*  BUN 15 15  CREATININE 1.17 1.01  CALCIUM 9.4 8.5*  MG  --  2.3  GFRNONAA >60 >60  ANIONGAP 11 6    No results for input(s): "PROT", "ALBUMIN", "AST", "ALT", "ALKPHOS", "BILITOT" in the last 168 hours. Lipids No results for input(s): "CHOL", "TRIG", "HDL", "LABVLDL", "LDLCALC", "CHOLHDL" in the last 168 hours. Hematology Recent Labs  Lab 06/02/23 1815 06/03/23 0438  WBC  8.2 7.2  RBC 4.56 4.04*  HGB 13.2 11.9*  HCT 40.4 35.5*  MCV 88.6 87.9  MCH 28.9 29.5  MCHC 32.7 33.5  RDW 13.6 13.5  PLT 187 160   Thyroid No results for input(s): "TSH", "FREET4" in the last 168 hours. BNP Recent Labs  Lab 06/02/23 1815  BNP 47.9    DDimer  Recent Labs  Lab 06/03/23 0234  DDIMER <0.27     Radiology/Studies:  No results found.   Assessment and Plan:   Shortness of Breath Severe Aortic Stenosis -severe symptomatic AS Chronic HFpEF Patient has a history of aortic stenosis and Echo from 05/14/2023 showed progression to severe AS. R/LHC on 05/27/2023 showed patent stents with no significant obstructive CAD and relatively normal right heart pressures (cardiac output 5.61 and index 2.55). Gated CTA showed tricuspid aortic valve with severely reduced cusp excursion, aortic valve calcium score of 3744, and an annular area of 574 mm, suitable for a 29 mm Edwards SAPIEN 3 valve. TAVR was recommended. He was seen by Dr. Laneta Simmers in the office today for surgical consultation and was noted to be acutely short of breath so was direct admitted with plans to keep inpatient until TAVR next week. - Looks euvolemic on exam. Breathing more comfortably on 3L of O2. - Will check BNP and chest x-ray.  - Continue home PO Lasix 20mg  twice weekly (Monday and Thursday) for now.  - TAVR is scheduled for 06/10/2023. He has been admitted to the ICU but he is stable. He can be transferred to floor when cardiac telemetry bed is available.  CAD History of multiple interventions in the past including prior stenting to RCA and LAD. Last intervention was PTCA with kissing balloon technique of in-stent restenosis of LAD-D2 bifurcation site in 06/2021.  R/LHC on 05/27/2023  showed widely patent stents to LAD and RCA with 50% in-stent restenosis of mid LAD at previous PTCA site. - He continues to have some atypical chest pain but does not sound like angina. - Continue Ranexa 500mg  twice daily. - No  aspirin due to need for Eliquis. - Continue statin.  Paroxysmal Atrial Fibrillation/ Flutter S/p atrial flutter ablation in 2006 and atrial fibrillation ablation in 01/2014. - Maintaining sinus rhythm. Rates in the 70s to 80s. - Not on any AV nodal agents. - Continue Eliquis 5mg  twice daily. Will hold this for 2 days prior to TAVR. Last dose will be evening of 06/07/2023.  Hypertension History of hypertension. However, BP well controlled without any antihypertensives. - Continue to monitor.  Hyperlipidemia - Continue home Crestor 40mg  daily.  Type 2 Diabetes Mellitus Hemoglobin A1c 6.3% on 05/22/2023. - On Jardiance and Metformin at home. Will hold both and place on sliding scale insulin while here.  Interstitial Lung Disease - Continue Prednisone 20mg  daily.  Plaque Psoriasis - On Hadlima injections every 2 weeks. Last dose was 06/03/2023.  Memory Issues - On Namenda and Galantamine at home. Can continue here. - Of note, patient's wife states the [place] recently prescribed Hydroxyzine and Zoloft but patient has not started these. They would like to wait to start these until after TAVR so will not order.    Risk Assessment/Risk Scores:    New York Heart Association (NYHA) Functional Class NYHA Class IV  CHA2DS2-VASc Score = 5  This indicates a 7.2% annual risk of stroke. The patient's score is based upon: CHF History: 1 HTN History: 0 Diabetes History: 1 Stroke History: 0 Vascular Disease History: 1 Age Score: 2 Gender Score: 0    Code Status: Full Code  Severity of Illness: The appropriate patient status for this patient is INPATIENT. Inpatient status is judged to be reasonable and necessary in order to provide the required intensity of service to ensure the patient's safety. The patient's presenting symptoms, physical exam findings, and initial radiographic and laboratory data in the context of their chronic comorbidities is felt to place them at high risk for further  clinical deterioration. Furthermore, it is not anticipated that the patient will be medically stable for discharge from the hospital within 2 midnights of admission.   * I certify that at the point of admission it is my clinical judgment that the patient will require inpatient hospital care spanning beyond 2 midnights from the point of admission due to high intensity of service, high risk for further deterioration and high frequency of surveillance required.*   For questions or updates, please contact [facility] Please consult [email or web address] for contact info under     Signed, Smitty Knudsen  06/04/2023 4:39 PM   ATTENDING ATTESTATION  I have seen, examined and evaluated the patient this afternoon along with Fenton Foy, PA.  After reviewing all the available data and chart, we discussed the patients laboratory, study & physical findings as well as symptoms in detail.  I agree with her findings, examination as well as impression recommendations as per our discussion.    Attending adjustments noted in italics.    On my assessment while laying in bed, Damarius looks pretty good.  He is not having significant dyspnea or fatigue symptoms, but clearly having walked into the clinic office today to see Dr. Laneta Simmers, he did show signs of extreme fatigue and dyspnea that was concerning enough to have him hospitalized while awaiting his TAVR next week.  The  original plan was for him to go home and be relatively sedentary tween now 1 next week, however Dr. Laneta Simmers felt that it was more prudent to have him stay in the hospital pending his procedure due to his high likelihood of "bounce back.  He felt the safer for him to be in-house to be placed on oxygen and monitor closely were something to happen.  He clearly has high-grade aortic stenosis is now quite symptomatic.  With his underlying lung disease he has very minimal reserve and has a high likelihood of decompensation.  He was therefore  admitted with the potential need for assistance with his breathing, however upon arrival was doing well enough that he can probably be transferred to the telemetry unit..  We have simply continue his current management.  I do not think he is volume overloaded and therefore does not need diuretic.  Work on afterload reduction and complete pre-TAVR evaluation.  Plan for TAVR  06/10/2023   Marykay Lex, MD, MS Bryan Lemma, M.D., M.S. Interventional Cardiologist  [facility]  Pager # [phone] Phone # [phone] [address] [place], [place] [address]

## 2023-06-05 DIAGNOSIS — I350 Nonrheumatic aortic (valve) stenosis: Secondary | ICD-10-CM | POA: Diagnosis not present

## 2023-06-05 LAB — GLUCOSE, CAPILLARY
Glucose-Capillary: 118 mg/dL — ABNORMAL HIGH (ref 70–99)
Glucose-Capillary: 133 mg/dL — ABNORMAL HIGH (ref 70–99)
Glucose-Capillary: 120 mg/dL — ABNORMAL HIGH (ref 70–99)
Glucose-Capillary: 128 mg/dL — ABNORMAL HIGH (ref 70–99)

## 2023-06-05 LAB — BASIC METABOLIC PANEL
Anion gap: 8 (ref 5–15)
BUN: 15 mg/dL (ref 8–23)
CO2: 23 mmol/L (ref 22–32)
Calcium: 9 mg/dL (ref 8.9–10.3)
Chloride: 108 mmol/L (ref 98–111)
Creatinine, Ser: 1.22 mg/dL (ref 0.61–1.24)
GFR, Estimated: >60 mL/min (ref >60)
Glucose, Bld: 103 mg/dL — ABNORMAL HIGH (ref 70–99)
Potassium: 4.5 mmol/L (ref 3.5–5.1)
Sodium: 139 mmol/L (ref 135–145)

## 2023-06-05 MED ORDER — PREDNISONE 5 MG PO TABS
5.0000 mg | ORAL_TABLET | Freq: Every day | ORAL | Status: DC
Start: 2023-06-06 — End: 2023-06-11
  Administered 2023-06-06 – 2023-06-11 (×6): 5 mg via ORAL
  Filled 2023-06-05 (×6): qty 1

## 2023-06-05 MED ORDER — APIXABAN 5 MG PO TABS
5.0000 mg | ORAL_TABLET | Freq: Two times a day (BID) | ORAL | Status: AC
  Administered 2023-06-05 – 2023-06-06 (×4): 5 mg via ORAL
  Filled 2023-06-05 (×4): qty 1

## 2023-06-05 NOTE — Progress Notes (Addendum)
 [facility]   MULTIDISCIPLINARY HEART VALVE TEAM  Patient Name: Kenneth [name] Date of Encounter: 06/05/2023  Admit date: 06/04/2023  Primary Care Provider: Clinic, [facility] Cardiologist: Bryan Lemma, MD  [facility] Electrophysiologist:  None   [facility] Problem List     Principal Problem:   Severe aortic stenosis     Subjective   Feeling much better on oxygen with no symptoms. Sitting up in chair with wife at bedside.   Inpatient Medications    Scheduled Meds:  apixaban  5 mg Oral BID   Chlorhexidine Gluconate Cloth  6 each Topical Daily   dorzolamide-timolol  1 drop Both Eyes QHS   [START ON 06/06/2023] ferrous sulfate  325 mg Oral Q M,W,F   furosemide  20 mg Oral Once per day on Monday Thursday   galantamine  24 mg Oral q AM   insulin aspart  0-15 Units Subcutaneous TID WC   loratadine  10 mg Oral Daily   memantine  20 mg Oral QHS   predniSONE  20 mg Oral Q breakfast   ranolazine  500 mg Oral BID   rosuvastatin  40 mg Oral QHS   Continuous Infusions:  PRN Meds: acetaminophen, nitroGLYCERIN, ondansetron (ZOFRAN) IV, mouth rinse, pantoprazole   Vital Signs    Vitals:   06/05/23 0400 06/05/23 0500 06/05/23 0600 06/05/23 0700  BP: (!) 142/64 (!) 124/58 119/61   Pulse: (!) 59 (!) 59 61   Resp:      Temp:    (!) 96.7 F (35.9 C)  TempSrc:    Axillary  SpO2: 100% 99% 99%   Weight:   90 kg     Intake/Output Summary (Last 24 hours) at 06/05/2023 0834 Last data filed at 06/05/2023 0000 Gross per 24 hour  Intake --  Output 400 ml  Net -400 ml   Filed Weights   06/05/23 0600  Weight: 90 kg    Physical Exam    GEN: Well nourished, well developed, in no acute distress.  HEENT: Grossly normal.  Neck: Supple, no JVD, or masses. Cardiac: RRR, 3/6 harsh systolic murmur with obscured second heart sound. No rubs, or gallops. No clubbing, cyanosis, edema.   Respiratory:  Respirations regular and unlabored, clear to  auscultation bilaterally. GI: Soft, nontender, nondistended, BS + x 4. MS: no deformity or atrophy. Skin: warm and dry, no rash. Neuro:  Strength and sensation are intact. Psych: AAOx3.  Normal affect.  Labs    CBC Recent Labs    06/03/23 0438 06/04/23 2211  WBC 7.2 7.2  NEUTROABS  --  4.9  HGB 11.9* 12.6*  HCT 35.5* 38.4*  MCV 87.9 88.3  PLT 160 181   Basic Metabolic Panel Recent Labs    30/86/57 0438 06/04/23 2211 06/05/23 0210  NA 138 139 139  K 3.7 4.5 4.5  CL 108 107 108  CO2 24 24 23   GLUCOSE 116* 148* 103*  BUN 15 15 15   CREATININE 1.01 1.31* 1.22  CALCIUM 8.5* 8.9 9.0  MG 2.3  --   --    Liver Function Tests Recent Labs    06/04/23 2211  AST 25  ALT 21  ALKPHOS 50  BILITOT 0.6  PROT 6.6  ALBUMIN 3.8   D-Dimer Recent Labs    06/03/23 0234  DDIMER <0.27    Telemetry    Sinus with PACs and 3 beat run of NSVT.  HRs in 60s- Personally Reviewed  ECG    Sinus, HR  45 - Personally Reviewed  Patient Profile     Kenneth [name] is a 78 y.o. male with a hx of paroxysmal atrial flutter s/p ablation 2006 & 2015, CAD s/p multiple PCIs, interstitial lung disease on chronic low dose prednisone, HTN, HLD, DMT2 and severe AS who was directly admitted from the office yesterday due to acute dyspnea at rest.   Assessment & Plan    Shortness of Breath -- Likely multifactorial 2/2 chronic lung disease, CAD, severe aortic stenosis and anxiety.  -- 02 sats 100% on RA but pt feels much better when on nasal cannula 02.  -- CXR with no pulmonary edema and BNP normal.  -- He is stable for transfer out of the ICU to 4E.  Severe AS:  -- He has been evaluated by the multidisciplinary valve team and plan is for TAVR-TF next Tuesday 06/30/23.  -- He will remain inpatient until surgery next week.  -- Will hold Eliquis starting 3/1 (last dose 2/28).   Chronic HFpEF -- Appears euvolemic.  -- CXR with no pulmonary edema. BNP normal. -- Continued on home Lasix 2x a  week (m,th).   CAD -- History of multiple interventions in the past including prior stenting to RCA and LAD. Last intervention was PTCA with kissing balloon technique of in-stent restenosis of LAD-D2 bifurcation site in 06/2021.  -- R/LHC on 05/27/2023 showed widely patent stents to LAD and RCA with 50% in-stent restenosis of mid LAD at previous PTCA site. -- He continues to have some atypical chest pain but does not sound like angina. -- Continue Ranexa 500mg  twice daily. -- No aspirin due to need for Eliquis. -- Continue statin.   Paroxysmal Atrial Fibrillation/ Flutter -- S/p atrial flutter ablation in 2006 and atrial fibrillation ablation in 01/2014. -- Maintaining sinus rhythm. Rates in the 70s to 80s. -- Not on any AV nodal agents. -- Continue Eliquis 5mg  twice daily. Will hold Eliquis starting 3/1 ( last dose 2/28)   Hypertension -- BP well controlled without any antihypertensives.   Hyperlipidemia -- Continue home Crestor 40mg  daily.   Type 2 Diabetes Mellitus -- Hemoglobin A1c 6.3% on 05/22/2023. -- Continue SSI. -- Hold home Jardiance and Metformin.   Interstitial Lung Disease -- Continue Prednisone 5mg  daily.   Plaque Psoriasis -- On Hadlima injections every 2 weeks. Last dose was 06/03/2023.   Memory Issues -- On Namenda and Galantamine at home. Can continue here. -- Of note, patient's wife states the [place] recently prescribed Hydroxyzine and Zoloft but patient has not started these. They would like to wait to start these until after TAVR so they were not ordered.  Byrd Hesselbach, PA-C  06/05/2023, 8:34 AM  Pager [phone]  Patient seen, examined. Available data reviewed. Agree with findings, assessment, and plan as outlined by Carlean Jews, PA-C.  The patient is independently interviewed and examined.  He is alert, oriented, in no distress.  Lung fields are clear.  Heart is regular rate and rhythm with a 3/6 harsh late peaking crescendo decrescendo murmur at  the right upper sternal border, JVP is normal, lower extremities have no edema, skin is warm and dry with no rash.  The patient now appears comfortable on oxygen per nasal cannula.  He is not in any respiratory distress and his lung fields sound clear.  Chest x-ray shows no evidence of interstitial edema or pleural effusion.  The patient has severe symptoms with minimal exertion, developing shortness of breath and profound weakness.  I agree that he  needs to remain hospitalized until we proceed with TAVR early next week.  Discussed plan with the patient and his wife, both of whom are agreeable.  Will hold Eliquis beginning 3/1 and plan to proceed with TAVR 3/4.  Tonny Bollman, M.D. 06/05/2023 5:24 PM

## 2023-06-05 NOTE — Plan of Care (Signed)
  Problem: Education: Goal: Knowledge of General Education information will improve Description: Including pain rating scale, medication(s)/side effects and non-pharmacologic comfort measures Outcome: Progressing   Problem: Health Behavior/Discharge Planning: Goal: Ability to manage health-related needs will improve Outcome: Progressing   Problem: Clinical Measurements: Goal: Ability to maintain clinical measurements within normal limits will improve Outcome: Progressing Goal: Cardiovascular complication will be avoided Outcome: Progressing   Problem: Activity: Goal: Risk for activity intolerance will decrease Outcome: Progressing   Problem: Nutrition: Goal: Adequate nutrition will be maintained Outcome: Progressing   Problem: Coping: Goal: Level of anxiety will decrease Outcome: Progressing   

## 2023-06-06 ENCOUNTER — Other Ambulatory Visit (HOSPITAL_COMMUNITY): Payer: Medicare Other

## 2023-06-06 DIAGNOSIS — D6869 Other thrombophilia: Secondary | ICD-10-CM | POA: Diagnosis not present

## 2023-06-06 DIAGNOSIS — I5033 Acute on chronic diastolic (congestive) heart failure: Secondary | ICD-10-CM | POA: Diagnosis not present

## 2023-06-06 DIAGNOSIS — I25119 Atherosclerotic heart disease of native coronary artery with unspecified angina pectoris: Secondary | ICD-10-CM | POA: Diagnosis not present

## 2023-06-06 DIAGNOSIS — I350 Nonrheumatic aortic (valve) stenosis: Secondary | ICD-10-CM | POA: Diagnosis not present

## 2023-06-06 DIAGNOSIS — I480 Paroxysmal atrial fibrillation: Secondary | ICD-10-CM

## 2023-06-06 LAB — GLUCOSE, CAPILLARY
Glucose-Capillary: 84 mg/dL (ref 70–99)
Glucose-Capillary: 134 mg/dL — ABNORMAL HIGH (ref 70–99)
Glucose-Capillary: 106 mg/dL — ABNORMAL HIGH (ref 70–99)
Glucose-Capillary: 97 mg/dL (ref 70–99)

## 2023-06-06 LAB — BASIC METABOLIC PANEL
Anion gap: 8 (ref 5–15)
BUN: 17 mg/dL (ref 8–23)
CO2: 23 mmol/L (ref 22–32)
Calcium: 8.9 mg/dL (ref 8.9–10.3)
Chloride: 105 mmol/L (ref 98–111)
Creatinine, Ser: 1.17 mg/dL (ref 0.61–1.24)
GFR, Estimated: >60 mL/min (ref >60)
Glucose, Bld: 98 mg/dL (ref 70–99)
Potassium: 3.8 mmol/L (ref 3.5–5.1)
Sodium: 136 mmol/L (ref 135–145)

## 2023-06-06 NOTE — Plan of Care (Signed)
  Problem: Education: Goal: Knowledge of General Education information will improve Description: Including pain rating scale, medication(s)/side effects and non-pharmacologic comfort measures Outcome: Progressing   Problem: Health Behavior/Discharge Planning: Goal: Ability to manage health-related needs will improve Outcome: Progressing   Problem: Clinical Measurements: Goal: Ability to maintain clinical measurements within normal limits will improve Outcome: Progressing Goal: Will remain free from infection Outcome: Progressing Goal: Diagnostic test results will improve Outcome: Progressing Goal: Respiratory complications will improve Outcome: Progressing Goal: Cardiovascular complication will be avoided Outcome: Progressing   Problem: Nutrition: Goal: Adequate nutrition will be maintained Outcome: Progressing   Problem: Coping: Goal: Level of anxiety will decrease Outcome: Progressing   Problem: Elimination: Goal: Will not experience complications related to bowel motility Outcome: Progressing Goal: Will not experience complications related to urinary retention Outcome: Progressing   Problem: Pain Managment: Goal: General experience of comfort will improve and/or be controlled Outcome: Progressing   Problem: Safety: Goal: Ability to remain free from injury will improve Outcome: Progressing   Problem: Skin Integrity: Goal: Risk for impaired skin integrity will decrease Outcome: Progressing   Problem: Education: Goal: Ability to describe self-care measures that may prevent or decrease complications (Diabetes Survival Skills Education) will improve Outcome: Progressing Goal: Individualized Educational Video(s) Outcome: Progressing   Problem: Health Behavior/Discharge Planning: Goal: Ability to identify and utilize available resources and services will improve Outcome: Progressing Goal: Ability to manage health-related needs will improve Outcome: Progressing    Problem: Metabolic: Goal: Ability to maintain appropriate glucose levels will improve Outcome: Progressing   Problem: Nutritional: Goal: Maintenance of adequate nutrition will improve Outcome: Progressing Goal: Progress toward achieving an optimal weight will improve Outcome: Progressing   Problem: Skin Integrity: Goal: Risk for impaired skin integrity will decrease Outcome: Progressing   Problem: Tissue Perfusion: Goal: Adequacy of tissue perfusion will improve Outcome: Progressing

## 2023-06-06 NOTE — Progress Notes (Signed)
 HEART AND VASCULAR CENTER   MULTIDISCIPLINARY HEART VALVE TEAM  Patient Name: Kenneth [name] Date of Encounter: 06/06/2023  Admit date: 06/04/2023  Primary Care Provider: [facility] Cardiologist: Bryan Lemma, MD  [facility] Electrophysiologist:  None   [facility] Problem List     Principal Problem:   Severe aortic stenosis Active Problems:   ILD (interstitial lung disease) (HCC)   Coronary artery disease involving native coronary artery with angina pectoris (HCC)   Acute on chronic diastolic heart failure (HCC)   Chronic heart failure with preserved ejection fraction (HFpEF) (HCC)   Hyperlipidemia associated with type 2 diabetes mellitus (HCC)   Paroxysmal atrial fibrillation (HCC)   Hypercoagulable state due to paroxysmal atrial fibrillation (HCC); CHA2DS2-VASc score 5    Subjective   Feels well.  Notably less air hunger with oxygen although he is not hypoxic. Has been able to ambulate around the nursing station as long as wearing oxygen. No chest pain or pressure.  Just feels tired  Inpatient Medications    Scheduled Meds:  apixaban  5 mg Oral BID   Chlorhexidine Gluconate Cloth  6 each Topical Daily   dorzolamide-timolol  1 drop Both Eyes QHS   ferrous sulfate  325 mg Oral Q M,W,F   furosemide  20 mg Oral Once per day on Monday Thursday   galantamine  24 mg Oral q AM   insulin aspart  0-15 Units Subcutaneous TID WC   loratadine  10 mg Oral Daily   memantine  20 mg Oral QHS   predniSONE  5 mg Oral Q breakfast   ranolazine  500 mg Oral BID   rosuvastatin  40 mg Oral QHS   Continuous Infusions:  PRN Meds: acetaminophen, nitroGLYCERIN, ondansetron (ZOFRAN) IV, mouth rinse, pantoprazole   Vital Signs    Vitals:   06/06/23 1000 06/06/23 1100 06/06/23 1200 06/06/23 1300  BP: 119/65 (!) 152/93 127/76 134/79  Pulse: 65 (!) 190 71 81  Resp:   18   Temp:  (!) 97.4 F (36.3 C)    TempSrc:  Axillary    SpO2: 100% 90% 98% 94%   Weight:      Height:        Intake/Output Summary (Last 24 hours) at 06/06/2023 1323 Last data filed at 06/05/2023 2200 Gross per 24 hour  Intake --  Output 200 ml  Net -200 ml   Filed Weights   06/05/23 0600 06/05/23 0800 06/06/23 0500  Weight: 90 kg 90 kg 89.9 kg    Physical Exam    GEN: Well nourished, well developed, in no acute distress.  HEENT: Grossly normal.  Neck: Supple, no JVD, or masses. Cardiac: RRR with normal S1 and obscuredS2 but harsh 3/6 SEM at RUSB-neck, delayed cardiac upstroke.  No rubs, or gallops. Respiratory:  Respirations regular and unlabored, clear to auscultation bilaterally. GI: Soft/NT/ND/NABS.  No HSM.  BS +4 MS:  No C/C/E. Psych: AAOx3.  Normal affect.  He is prone to anxiety, but is stable today.  Labs    CBC Recent Labs    06/04/23 2211  WBC 7.2  NEUTROABS 4.9  HGB 12.6*  HCT 38.4*  MCV 88.3  PLT 181   Basic Metabolic Panel Recent Labs    40/98/11 0210 06/06/23 0324  NA 139 136  K 4.5 3.8  CL 108 105  CO2 23 23  GLUCOSE 103* 98  BUN 15 17  CREATININE 1.22 1.17  CALCIUM 9.0 8.9   Liver Function Tests Recent Labs  06/04/23 2211  AST 25  ALT 21  ALKPHOS 50  BILITOT 0.6  PROT 6.6  ALBUMIN 3.8   D-Dimer No results for input(s): "DDIMER" in the last 72 hours.   Telemetry    Sinus with PACs and 3 beat run of NSVT.  HRs in 60s- Personally Reviewed  ECG    Sinus, HR 82 - Personally Reviewed  Patient Profile     Kenneth [name] is a 79 y.o. male with a hx of paroxysmal atrial flutter s/p ablation 2006 & 2015, CAD s/p multiple PCIs, interstitial lung disease on chronic low dose prednisone, HTN, HLD, DMT2 and severe AS who was directly admitted from the office yesterday due to acute dyspnea at rest.   Assessment & Plan    Shortness of Breath -- Likely multifactorial 2/2 chronic lung disease, CAD, severe aortic stenosis and anxiety.  -- 02 sats 100% on RA but pt feels much better when on nasal cannula 02.  --  CXR with no pulmonary edema and BNP normal.  I suspect that this is combination of his underlying lung disease with no ability to compensate for his now severe AS.  Oxygen is helping with his air hunger, and he has been able to walk more while wearing the oxygen.  On his outpatient visit with Dr. Laneta Simmers, he clearly showed that he was not stable to be home while waiting for his upcoming TAVR next week.  Severe AS:  -- He has been evaluated by the multidisciplinary valve team and plan is for TAVR-TF next Tuesday 06/30/23. ->  Seen by Dr. Excell Seltzer in the inpatient setting earlier this week, and felt to be stable enough for discharge with minimal activity and plan TAVR next week on 06/10/2023, however the following day when seen by Dr. Laneta Simmers, was again profoundly dyspneic on evaluation and felt that his more prudent for him to be admitted and monitored pending his upcoming TAVR. -- Currently on Eliquis.  Has not been in A-fib, can hold off until post TAVR.-Last dose this evening.   Chronic HFpEF-euvolemic on exam, with no CXR evidence of pulmonary edema and BNP normal. -- Continue home dose of diuretic. --Currently on empagliflozin, but will stop today for upcoming TAVR   CAD History of multiple interventions in the past including prior stenting to RCA and LAD. Last intervention was PTCA with kissing balloon technique of in-stent restenosis of LAD-D2 bifurcation site in 06/2021.  R/LHC on 05/27/2023 showed widely patent stents to LAD and RCA with 50% in-stent restenosis of mid LAD at previous PTCA site. => Plan was to see how he does following TAVR as far as his dyspnea symptoms ago.  Not having any angina.  However if no significant improvement after TAVR could consider bifurcation PCI again. -- He does have atypical sounding chest discomfort but not consistent with angina.  His major symptom is dyspnea. -- Continue Ranexa 500mg  twice daily. -- No antiplatelet agent due to Eliquis. -- Continue home dose of  statin.   Paroxysmal Atrial Fibrillation/ Flutter -- S/p atrial flutter ablation in 2006 and atrial fibrillation ablation in 01/2014. -- Maintaining sinus rhythm. Rates in the 70s to 80s. -- Not on any AV nodal agents. -- Continue Eliquis 5mg  twice daily. Will hold Eliquis starting 3/1 ( last dose 2/28)   Hypertension => stable with no meds.  Hyperlipidemia --continue home dose of Crestor 40 mg daily.   Type 2 Diabetes Mellitus -- Hemoglobin A1c 6.3% on 05/22/2023. -- Cover with SSI while in the  hospital.  Holding home dose of Jardiance and metformin.   Interstitial Lung Disease -- Continue Prednisone 5mg  daily.   Plaque Psoriasis -- On Hadlima injections every 2 weeks. Last dose was 06/03/2023.   Memory Issues -- On Namenda and Galantamine at home. Can continue here. -- Of note, patient's wife states the [place] recently prescribed Hydroxyzine and Zoloft but patient has not started these. They would like to wait to start these until after TAVR so they were not ordered.   Transferring to Tele Floor  Signed, Bryan Lemma, MD  06/06/2023, 1:23 PM

## 2023-06-06 NOTE — TOC Initial Note (Signed)
 Transition of Care [facility]) - Initial/Assessment Note    Patient Details  Name: Kenneth [name] MRN: [id number] Date of Birth: Oct 08, 1945  Transition of Care [facility]) CM/SW Contact:    Elliot Cousin, RN Phone Number: [phone] 06/06/2023, 5:32 PM  Clinical Narrative:     TAVR 06/10/2023             TOC CM spoke to pt, wife and dtr at bedside. Pt was independent pta. Has RW at home to use if needed. Does feel HH or DME will be needed. Pt drives to appts. Wife/Dtr will provide transportation home. Will continue to follow for dc needs.    Expected Discharge Plan: Home/Self Care Barriers to Discharge: Continued Medical Work up   Patient Goals and CMS Choice Patient states their goals for this hospitalization and ongoing recovery are:: wants to remain independent          Expected Discharge Plan and Services   Discharge Planning Services: CM Consult   Living arrangements for the past 2 months: Single Family Home                                      Prior Living Arrangements/Services Living arrangements for the past 2 months: Single Family Home Lives with:: Spouse Patient language and need for interpreter reviewed:: Yes Do you feel safe going back to the place where you live?: Yes      Need for Family Participation in Patient Care: No (Comment) Care giver support system in place?: Yes (comment) Current home services: DME (rolling walker, cane) Criminal Activity/Legal Involvement Pertinent to Current Situation/Hospitalization: No - Comment as needed  Activities of Daily Living   ADL Screening (condition at time of admission) Independently performs ADLs?: Yes (appropriate for developmental age) Is the patient deaf or have difficulty hearing?: Yes Does the patient have difficulty seeing, even when wearing glasses/contacts?: Yes Does the patient have difficulty concentrating, remembering, or making decisions?: No  Permission Sought/Granted Permission sought to  share information with : Case Manager, Family Supports, PCP Permission granted to share information with : Yes, Verbal Permission Granted  Share Information with NAME: Joneen Boers     Permission granted to share info w Relationship: wife  Permission granted to share info w Contact Information: [phone]  Emotional Assessment Appearance:: Appears stated age Attitude/Demeanor/Rapport: Engaged Affect (typically observed): Accepting Orientation: : Oriented to Self, Oriented to Place, Oriented to  Time, Oriented to Situation   Psych Involvement: No (comment)  Admission diagnosis:  Severe aortic stenosis [I35.0] Patient Active Problem List   Diagnosis Date Noted   Severe aortic stenosis 06/04/2023   SOB (shortness of breath) 06/02/2023   Preop cardiovascular exam 01/03/2023   S/P PTCA (percutaneous transluminal coronary angioplasty)    Nonrheumatic aortic (valve) stenosis 06/21/2021   Acute on chronic diastolic heart failure (HCC) 06/20/2021   Premature atrial complexes 06/20/2021   Hypercoagulable state due to paroxysmal atrial fibrillation (HCC); CHA2DS2-VASc score 5 01/08/2021   Overweight (BMI 25.0-29.9) 12/29/2018   Atrial tachycardia (HCC) 02/12/2018   Right ureteral stone 10/27/2017   Controlled type 2 diabetes mellitus without complication, without long-term current use of insulin (HCC) 05/12/2017   Essential hypertension 05/12/2017   Iron deficiency anemia 11/11/2016   Hyperlipidemia associated with type 2 diabetes mellitus (HCC) 11/11/2016   CAD S/P percutaneous coronary angioplasty 06/28/2016   Coronary artery disease involving native coronary artery with angina pectoris (HCC)  06/27/2016   Exertional dyspnea 06/27/2016   Bronchitis 07/03/2015   ILD (interstitial lung disease) (HCC) 01/27/2014   Atypical atrial flutter (HCC) 01/13/2014   Hx of colonic polyp 07/15/2013   Chronic heart failure with preserved ejection fraction (HFpEF) (HCC) 05/22/2013   OTHER CHRONIC  SINUSITIS 05/31/2010   NEPHROLITHIASIS, HX OF 02/02/2008   PERSISTENT DISORDER INITIATING/MAINTAINING SLEEP 12/16/2007   Obstructive sleep apnea 10/07/2007   UNSPEC ALVEOLAR&PARIETOALVEOLAR PNEUMONOPATHY 10/02/2007   Allergic rhinitis 07/24/2007   Paroxysmal atrial fibrillation (HCC) 07/06/2007   PCP:  [facility] Pharmacy:   [facility] [address], [place] - [address] [place] [address] Phone: [phone] Fax: [phone]  [facility] [address] [place] [address] Phone: [phone] Fax: [phone]  [facility] - [place], [place] - [address] [place] [address] Phone: [phone] Fax: [phone]     Social Drivers of Health (SDOH) Social History: SDOH Screenings   Food Insecurity: No Food Insecurity (06/04/2023)  Housing: Low Risk  (06/04/2023)  Transportation Needs: No Transportation Needs (06/04/2023)  Utilities: Not At Risk (06/04/2023)  Social Connections: Moderately Integrated (06/04/2023)  Tobacco Use: Medium Risk (06/04/2023)   SDOH Interventions:     Readmission Risk Interventions     No data to display

## 2023-06-06 NOTE — Plan of Care (Signed)

## 2023-06-07 DIAGNOSIS — I350 Nonrheumatic aortic (valve) stenosis: Secondary | ICD-10-CM | POA: Diagnosis not present

## 2023-06-07 LAB — BASIC METABOLIC PANEL
Anion gap: 9 (ref 5–15)
BUN: 17 mg/dL (ref 8–23)
CO2: 25 mmol/L (ref 22–32)
Calcium: 9.3 mg/dL (ref 8.9–10.3)
Chloride: 105 mmol/L (ref 98–111)
Creatinine, Ser: 1.15 mg/dL (ref 0.61–1.24)
GFR, Estimated: >60 mL/min (ref >60)
Glucose, Bld: 93 mg/dL (ref 70–99)
Potassium: 4.1 mmol/L (ref 3.5–5.1)
Sodium: 139 mmol/L (ref 135–145)

## 2023-06-07 LAB — GLUCOSE, CAPILLARY
Glucose-Capillary: 88 mg/dL (ref 70–99)
Glucose-Capillary: 108 mg/dL — ABNORMAL HIGH (ref 70–99)
Glucose-Capillary: 118 mg/dL — ABNORMAL HIGH (ref 70–99)
Glucose-Capillary: 123 mg/dL — ABNORMAL HIGH (ref 70–99)

## 2023-06-07 NOTE — Progress Notes (Signed)
   06/07/23 6578  What Happened  Was fall witnessed? No  Was patient injured? No  Patient found other (Comment) (knees on floor, head on chair)  Found by Staff-comment  Stated prior activity ambulating-unassisted  Provider Notification  Provider Name/Title Dr. Herbie Baltimore, PA Dr. Thedore Mins  Date Provider Notified 06/07/23  Time Provider Notified 0940  Method of Notification Face-to-face;Page  Notification Reason Fall (syncopal episode)  Provider response At bedside  Date of Provider Response 06/07/23  Time of Provider Response 0941  Follow Up  Family notified Yes - comment  Time family notified 0942 (arrived at bedside)  Additional tests Yes-comment  Simple treatment  (EKG/ blood glucose)  Progress note created (see row info) Yes  Adult Fall Risk Assessment  Risk Factor Category (scoring not indicated) Fall has occurred during this admission (document High fall risk)  Age 78  Fall History: Fall within 6 months prior to admission 0  Elimination; Bowel and/or Urine Incontinence 0  Elimination; Bowel and/or Urine Urgency/Frequency 0  Medications: includes PCA/Opiates, Anti-convulsants, Anti-hypertensives, Diuretics, Hypnotics, Laxatives, Sedatives, and Psychotropics 3  Patient Care Equipment 1  Mobility-Assistance 2  Mobility-Gait 0  Mobility-Sensory Deficit 0  Altered awareness of immediate physical environment 0  Impulsiveness 0  Lack of understanding of one's physical/cognitive limitations 0  Total Score 8  Patient Fall Risk Level High fall risk  Adult Fall Risk Interventions  Required Bundle Interventions *See Row Information* High fall risk - low, moderate, and high requirements implemented  Additional Interventions PT/OT need assessed if change in mobility from baseline;Use of appropriate toileting equipment (bedpan, BSC, etc.)  Fall intervention(s) refused/Patient educated regarding refusal Nonskid socks;Bed alarm;Yellow bracelet  Screening for Fall Injury Risk (To be  completed on HIGH fall risk patients) - Assessing Need for Floor Mats  Risk For Fall Injury- Criteria for Floor Mats None identified - No additional interventions needed  Vitals  Cardiac Rhythm Heart block (Strip for Jen  RN)  Heart Block Type 1st degree AVB  PCA/Epidural/Spinal Assessment  Respiratory Pattern Regular;Unlabored  Neurological  Neuro (WDL) X  Orientation Level Other (comment) (not responsive/ rapid response called)

## 2023-06-07 NOTE — Progress Notes (Signed)
 CODE BLUE called in setting of bradycardia and patient being unresponsive.  He did not lose a pulse.  Initial evaluation by nephrology with Dr. Thedore Mins.  On code team arrival patient is in bed able to state name and that he was trying to get up to change the wall calendar and then he remembers waking up on the ground.  He denies shortness of breath and chest pain.  Versus feeling sweaty and nauseated.  Exam: Cardiovascular: Bradycardic with systolic murmur Skin: Clammy Neuro: Alert and oriented x 3  EKG repeated which shows sinus rhythm with 1 PVC.  P: Heart rates now in 70s.  Primary team contacted and will come to bedside. Rapid nurse provided education with patient to have someone present whenever he gets up and moves around room now

## 2023-06-07 NOTE — Progress Notes (Addendum)
 Code was called. I happened to down the hallway when this occurred. Arrived to room to assist. Bradycardic in the 30s. Was in chair, unresponsive and in chair. Was reaching to change the calendar on the wall when this started. Had a pulse on arrival. RN's present at bedside. ACLS/CPR not required.  Initial quick exam: CV: brady, +systolic murmur Resp: cta bl Skin: cool clammy Ext: no sig edema b/l LEs Neuro: unresponsive  Moved to bed. HR up to 60's-70's. VSS. Blood glucose 108. O2 placed. EKG pending. Patient become much more responsive. Suspect vasovagal event. Code team arrived, they will take over. They will contact primary service. Discussed with staff and patient's wife outside the room.  Anthony Sar, MD [facility]

## 2023-06-07 NOTE — Progress Notes (Signed)
 Chaplain responded to a code page. Pt's wife and relative arrived during the incident. Medical team informed Pt's family about incident. Chaplain was able to talk with Pt's family, who were grateful for Chaplain's presence.  Oneida Alar Chaplain Resident    06/07/23 [phone]  Spiritual Encounters  Type of Visit Initial  Care provided to: Pt and family  Conversation partners present during encounter Nurse  Referral source Code page  Reason for visit Code  OnCall Visit Yes

## 2023-06-07 NOTE — Progress Notes (Signed)
   06/07/23 0940  Assess: MEWS Score  BP 129/70  MAP (mmHg) 89  ECG Heart Rate (!) 34  Resp 15  SpO2 100 %  O2 Device Room Air  Assess: MEWS Score  MEWS Temp 0  MEWS Systolic 0  MEWS Pulse 2  MEWS RR 0  MEWS LOC 0  MEWS Score 2  MEWS Score Color Yellow  Notify: Charge Nurse/RN  Name of Charge Nurse/RN Notified Dois Davenport  Assess: SIRS CRITERIA  SIRS Temperature  0  SIRS Respirations  0  SIRS Pulse 0  SIRS WBC 0  SIRS Score Sum  0

## 2023-06-07 NOTE — Progress Notes (Signed)
   Notified by RN that patient was feeling short of breath, possibly related to anxiety. I went to evaluate the patient. He was sitting upright in bed eating lunch. When at rest he has normal work of breathing on the [place]. When he speaks, he becomes a bit short of breath. He reports that when he moves around/sits up in the bed, he becomes short of breath. RN had instructed him to take deep breaths through his nose and out through his mouth, which helped his breathing return to normal. Patient is maintaining oxygen saturations between 97-100%. He is euvolemic on exam. He does have a grade 2/6 systolic murmur.   Discussed that his shortness of breath is likely due to his aortic stenosis as well as his interstitial lung disease. Patient does admit to feeling anxious at times. I offered to start a PRN anxiety medication to see if that would help him feel more comfortable, but patient declined at this time. He was prescribed zoloft and hydroxyzine PTA by the [place], but he has not yet started these medications. He would prefer to wait until after his TAVR was complete. I discussed him to take deep breaths through his nose and out through his mouth when he feels short of breath. If he changes his mind and wishes to try some anxiety medications, he will let me know.   Jonita Albee, PA-C 06/07/2023 12:44 PM

## 2023-06-07 NOTE — Progress Notes (Signed)
 Rounding Note    Patient Name: Kenneth [name] Date of Encounter: 06/07/2023  [facility] Cardiologist: Bryan Lemma, MD   Subjective   Patient feeling well currently.  Patient got out of bed to change the calendar on the wall.  As he was reaching up, he felt significant diaphoresis, dizziness.  He became significantly bradycardic.  CODE BLUE was called.  Heart rates improved rapidly.  He did have syncope.  Inpatient Medications    Scheduled Meds:  Chlorhexidine Gluconate Cloth  6 each Topical Daily   dorzolamide-timolol  1 drop Both Eyes QHS   ferrous sulfate  325 mg Oral Q M,W,F   furosemide  20 mg Oral Once per day on Monday Thursday   galantamine  24 mg Oral q AM   insulin aspart  0-15 Units Subcutaneous TID WC   loratadine  10 mg Oral Daily   memantine  20 mg Oral QHS   predniSONE  5 mg Oral Q breakfast   ranolazine  500 mg Oral BID   rosuvastatin  40 mg Oral QHS   Continuous Infusions:  PRN Meds: acetaminophen, nitroGLYCERIN, ondansetron (ZOFRAN) IV, mouth rinse, pantoprazole   Vital Signs    Vitals:   06/07/23 0323 06/07/23 0715 06/07/23 0940 06/07/23 0941  BP: 124/66 137/76 129/70   Pulse: 70 73    Resp: 14 18 15  (!) 24  Temp: 97.6 F (36.4 C) 97.8 F (36.6 C)    TempSrc: Oral Oral    SpO2: 99% 100% 100%   Weight: 88.8 kg     Height:        Intake/Output Summary (Last 24 hours) at 06/07/2023 1014 Last data filed at 06/06/2023 2213 Gross per 24 hour  Intake 240 ml  Output 175 ml  Net 65 ml      06/07/2023    3:23 AM 06/06/2023    5:00 AM 06/05/2023    8:00 AM  Last 3 Weights  Weight (lbs) 195 lb 12.3 oz 198 lb 3.1 oz 198 lb 6.6 oz  Weight (kg) 88.8 kg 89.9 kg 90 kg      Telemetry    Sinus rhythm with sinus bradycardia- Personally Reviewed  ECG     - Personally Reviewed  Physical Exam   GEN: No acute distress.   Neck: No JVD Cardiac: RRR, 2/6 systolic murmur Respiratory: Clear to auscultation bilaterally. GI: Soft, nontender,  non-distended  MS: No edema; No deformity. Neuro:  Nonfocal  Psych: Normal affect   Labs    High Sensitivity Troponin:   Recent Labs  Lab 06/02/23 1815 06/02/23 2015  TROPONINIHS 9 9     Chemistry Recent Labs  Lab 06/03/23 0438 06/04/23 2211 06/05/23 0210 06/06/23 0324 06/07/23 0320  NA 138 139 139 136 139  K 3.7 4.5 4.5 3.8 4.1  CL 108 107 108 105 105  CO2 24 24 23 23 25   GLUCOSE 116* 148* 103* 98 93  BUN 15 15 15 17 17   CREATININE 1.01 1.31* 1.22 1.17 1.15  CALCIUM 8.5* 8.9 9.0 8.9 9.3  MG 2.3  --   --   --   --   PROT  --  6.6  --   --   --   ALBUMIN  --  3.8  --   --   --   AST  --  25  --   --   --   ALT  --  21  --   --   --   ALKPHOS  --  50  --   --   --   BILITOT  --  0.6  --   --   --   GFRNONAA >60 56* >60 >60 >60  ANIONGAP 6 8 8 8 9     Lipids No results for input(s): "CHOL", "TRIG", "HDL", "LABVLDL", "LDLCALC", "CHOLHDL" in the last 168 hours.  Hematology Recent Labs  Lab 06/02/23 1815 06/03/23 0438 06/04/23 2211  WBC 8.2 7.2 7.2  RBC 4.56 4.04* 4.35  HGB 13.2 11.9* 12.6*  HCT 40.4 35.5* 38.4*  MCV 88.6 87.9 88.3  MCH 28.9 29.5 29.0  MCHC 32.7 33.5 32.8  RDW 13.6 13.5 13.5  PLT 187 160 181   Thyroid No results for input(s): "TSH", "FREET4" in the last 168 hours.  BNP Recent Labs  Lab 06/02/23 1815 06/04/23 2211  BNP 47.9 43.2    DDimer  Recent Labs  Lab 06/03/23 0234  DDIMER <0.27     Radiology    No results found.  Cardiac Studies     Patient Profile     78 y.o. male history of coronary artery disease, ILD, hypertension, hyperlipidemia, severe aortic stenosis, atrial fibrillation/flutter presented to the hospital with dyspnea.  Assessment & Plan    1.  Severe aortic stenosis: Has met with the heart valve team.  Has plans for TAVR 06/10/2023.  2.  Shortness of breath: Likely multifactorial due to lung disease, coronary artery disease, severe aortic stenosis.  Respiratory status stable.  Continue with current management.   No obvious volume overload.  3.  Syncopal episode: Was likely a combination of a vasovagal episode and severe aortic stenosis.  Unclear as to the trigger for his vagal episode, though he did have significant P-P prolongation.  Due to this episode, would hold off on ambulation for the patient.  4.  Coronary artery disease: Has had multiple interventions.  Holding antiplatelets.  Continue Ranexa.  5.  Paroxysmal atrial fibrillation/flutter: Patient is in sinus rhythm.  Holding Eliquis for TAVR  6.  Hypertension: Stable.  With syncopal episode, no adjustments at this time  7.  Hyperlipidemia: Continue Crestor  8.  Type 2 diabetes: Continue sliding scale insulin  9.  Interstitial lung disease: Continue prednisone  For questions or updates, please contact [facility] Please consult [email or web address] for contact info under        Signed, Kellyann Ordway Jorja Loa, MD  06/07/2023, 10:14 AM

## 2023-06-07 NOTE — Plan of Care (Signed)
   Problem: Education: Goal: Knowledge of General Education information will improve Description: Including pain rating scale, medication(s)/side effects and non-pharmacologic comfort measures Outcome: Progressing   Problem: Activity: Goal: Risk for activity intolerance will decrease Outcome: Progressing   Problem: Nutrition: Goal: Adequate nutrition will be maintained Outcome: Progressing

## 2023-06-07 NOTE — Progress Notes (Signed)
   06/07/23 0940  Vitals  BP 129/70  MAP (mmHg) 89  BP Location Left Arm  BP Method Automatic  Patient Position (if appropriate) Lying  ECG Heart Rate (!) 34  Resp 15  Oxygen Therapy  SpO2 100 %  O2 Device Room Air  Pain Assessment  Pain Scale 0-10  Pain Score 0  PCA/Epidural/Spinal Assessment  Respiratory Pattern Regular

## 2023-06-07 NOTE — Progress Notes (Signed)
 Patient found in room on knees with head down on room chair by student.  Patient responsive at that time. Patient stated that he was not injured.  Nearby nurse helped student assist patient to sit in chair.  At that time patient became unresponsive and code blue was pushed.  Patient did have pulse.  Multiple staff arrived to room, code blue cancelled, and Rapid Response was called.  Patient was placed in bed.  Patient slowly recovered, was nauseous and diaphoretic.  Blood glucose checked, EKG done, CCMD called and confirmed that at the time of the event the patient's heart rate was 32. Dr. Thedore Mins was nearby and assisted in room, Dr. Sloan Leiter arrived at bedside, Rapid response evaluated patient also.  Patient vitals within normal limits and patient alert and oriented.  Will continue to monitor patient.

## 2023-06-07 NOTE — Progress Notes (Signed)
   06/07/23 0939  ECG Monitoring  PR interval 0.48  QRS interval 0.18  QT interval 0.54  QTc interval 0.38  CV Strip Heart Rate 30  Cardiac Rhythm Heart block (Strip for Jen  RN)  Heart Block Type 1st degree AVB

## 2023-06-08 DIAGNOSIS — I350 Nonrheumatic aortic (valve) stenosis: Secondary | ICD-10-CM | POA: Diagnosis not present

## 2023-06-08 LAB — BASIC METABOLIC PANEL
Anion gap: 11 (ref 5–15)
BUN: 21 mg/dL (ref 8–23)
CO2: 22 mmol/L (ref 22–32)
Calcium: 9.4 mg/dL (ref 8.9–10.3)
Chloride: 106 mmol/L (ref 98–111)
Creatinine, Ser: 1.17 mg/dL (ref 0.61–1.24)
GFR, Estimated: >60 mL/min (ref >60)
Glucose, Bld: 112 mg/dL — ABNORMAL HIGH (ref 70–99)
Potassium: 3.8 mmol/L (ref 3.5–5.1)
Sodium: 139 mmol/L (ref 135–145)

## 2023-06-08 LAB — GLUCOSE, CAPILLARY
Glucose-Capillary: 102 mg/dL — ABNORMAL HIGH (ref 70–99)
Glucose-Capillary: 128 mg/dL — ABNORMAL HIGH (ref 70–99)
Glucose-Capillary: 125 mg/dL — ABNORMAL HIGH (ref 70–99)
Glucose-Capillary: 127 mg/dL — ABNORMAL HIGH (ref 70–99)

## 2023-06-08 MED ORDER — ENSURE ENLIVE PO LIQD
237.0000 mL | Freq: Two times a day (BID) | ORAL | Status: DC
  Administered 2023-06-08 – 2023-06-11 (×5): 237 mL via ORAL

## 2023-06-08 NOTE — Progress Notes (Signed)
 Rounding Note    Patient Name: Kenneth [name] Date of Encounter: 06/08/2023  [facility] Cardiologist: Bryan Lemma, MD   Subjective   Continuing to feel well.  Dyspnea at baseline.  Inpatient Medications    Scheduled Meds:  Chlorhexidine Gluconate Cloth  6 each Topical Daily   dorzolamide-timolol  1 drop Both Eyes QHS   ferrous sulfate  325 mg Oral Q M,W,F   furosemide  20 mg Oral Once per day on Monday Thursday   galantamine  24 mg Oral q AM   insulin aspart  0-15 Units Subcutaneous TID WC   loratadine  10 mg Oral Daily   memantine  20 mg Oral QHS   predniSONE  5 mg Oral Q breakfast   ranolazine  500 mg Oral BID   rosuvastatin  40 mg Oral QHS   Continuous Infusions:  PRN Meds: acetaminophen, nitroGLYCERIN, ondansetron (ZOFRAN) IV, mouth rinse, pantoprazole   Vital Signs    Vitals:   06/07/23 1600 06/07/23 2003 06/08/23 0014 06/08/23 0337  BP: 124/70 112/79 134/75 115/68  Pulse: 72 77 63 62  Resp: 18 20 20 19   Temp: 97.6 F (36.4 C) 97.8 F (36.6 C) 97.9 F (36.6 C) 97.7 F (36.5 C)  TempSrc: Oral Oral Oral Oral  SpO2: 100% 100% 94% 100%  Weight:    87.9 kg  Height:        Intake/Output Summary (Last 24 hours) at 06/08/2023 1018 Last data filed at 06/08/2023 0342 Gross per 24 hour  Intake 480 ml  Output 950 ml  Net -470 ml      06/08/2023    3:37 AM 06/07/2023    3:23 AM 06/06/2023    5:00 AM  Last 3 Weights  Weight (lbs) 193 lb 12.6 oz 195 lb 12.3 oz 198 lb 3.1 oz  Weight (kg) 87.9 kg 88.8 kg 89.9 kg      Telemetry    Sinus rhythm- Personally Reviewed  ECG     - Personally Reviewed  Physical Exam   GEN: No acute distress.   Neck: No JVD Cardiac: RRR, 2/6 systolic murmur at the base Respiratory: Clear to auscultation bilaterally. GI: Soft, nontender, non-distended  MS: No edema; No deformity. Neuro:  Nonfocal  Psych: Normal affect   Labs    High Sensitivity Troponin:   Recent Labs  Lab 06/02/23 1815 06/02/23 2015   TROPONINIHS 9 9     Chemistry Recent Labs  Lab 06/03/23 0438 06/04/23 2211 06/05/23 0210 06/06/23 0324 06/07/23 0320 06/08/23 0326  NA 138 139   < > 136 139 139  K 3.7 4.5   < > 3.8 4.1 3.8  CL 108 107   < > 105 105 106  CO2 24 24   < > 23 25 22   GLUCOSE 116* 148*   < > 98 93 112*  BUN 15 15   < > 17 17 21   CREATININE 1.01 1.31*   < > 1.17 1.15 1.17  CALCIUM 8.5* 8.9   < > 8.9 9.3 9.4  MG 2.3  --   --   --   --   --   PROT  --  6.6  --   --   --   --   ALBUMIN  --  3.8  --   --   --   --   AST  --  25  --   --   --   --   ALT  --  21  --   --   --   --  ALKPHOS  --  50  --   --   --   --   BILITOT  --  0.6  --   --   --   --   GFRNONAA >60 56*   < > >60 >60 >60  ANIONGAP 6 8   < > 8 9 11    < > = values in this interval not displayed.    Lipids No results for input(s): "CHOL", "TRIG", "HDL", "LABVLDL", "LDLCALC", "CHOLHDL" in the last 168 hours.  Hematology Recent Labs  Lab 06/02/23 1815 06/03/23 0438 06/04/23 2211  WBC 8.2 7.2 7.2  RBC 4.56 4.04* 4.35  HGB 13.2 11.9* 12.6*  HCT 40.4 35.5* 38.4*  MCV 88.6 87.9 88.3  MCH 28.9 29.5 29.0  MCHC 32.7 33.5 32.8  RDW 13.6 13.5 13.5  PLT 187 160 181   Thyroid No results for input(s): "TSH", "FREET4" in the last 168 hours.  BNP Recent Labs  Lab 06/02/23 1815 06/04/23 2211  BNP 47.9 43.2    DDimer  Recent Labs  Lab 06/03/23 0234  DDIMER <0.27     Radiology    No results found.   Patient Profile     78 y.o. male history of coronary artery disease, ILD, hypertension, hyperlipidemia, severe aortic stenosis, atrial fibrillation/flutter presented to the hospital with dyspnea  Assessment & Plan    1.  Severe aortic stenosis: Plan for TAVR 06/30/2023.  2.  Shortness of breath: Likely mild multifactorial from aortic stenosis and ILD.  No obvious volume overload.  3.  Coronary artery disease: Has had multiple intervention.  Holding antiplatelets.  Continue Ranexa.  4.  Paroxysmal atrial  fibrillation/flutter: Eliquis on hold for TAVR.  Remains in sinus rhythm.  5.  Syncopal episode: Appears vasovagal.  Combination of aortic stenosis cause to syncope.  No further bradycardia.  6.  Hypertension: Well-controlled  7.  Hyperlipidemia: Continue Crestor  8.  Type 2 diabetes: Continue sliding scale  9.  Interstitial lung disease: Continue prednisone  For questions or updates, please contact [facility] Please consult [email or web address] for contact info under        Signed, Jakhiya Brower Jorja Loa, MD  06/08/2023, 10:18 AM

## 2023-06-09 ENCOUNTER — Inpatient Hospital Stay (HOSPITAL_COMMUNITY)

## 2023-06-09 DIAGNOSIS — I350 Nonrheumatic aortic (valve) stenosis: Secondary | ICD-10-CM | POA: Diagnosis not present

## 2023-06-09 LAB — PROTIME-INR
INR: 1 (ref 0.8–1.2)
Prothrombin Time: 13.2 s (ref 11.4–15.2)

## 2023-06-09 LAB — GLUCOSE, CAPILLARY
Glucose-Capillary: 115 mg/dL — ABNORMAL HIGH (ref 70–99)
Glucose-Capillary: 143 mg/dL — ABNORMAL HIGH (ref 70–99)
Glucose-Capillary: 169 mg/dL — ABNORMAL HIGH (ref 70–99)
Glucose-Capillary: 126 mg/dL — ABNORMAL HIGH (ref 70–99)

## 2023-06-09 LAB — BASIC METABOLIC PANEL
Anion gap: 10 (ref 5–15)
BUN: 24 mg/dL — ABNORMAL HIGH (ref 8–23)
CO2: 24 mmol/L (ref 22–32)
Calcium: 9.6 mg/dL (ref 8.9–10.3)
Chloride: 104 mmol/L (ref 98–111)
Creatinine, Ser: 1.16 mg/dL (ref 0.61–1.24)
GFR, Estimated: >60 mL/min (ref >60)
Glucose, Bld: 122 mg/dL — ABNORMAL HIGH (ref 70–99)
Potassium: 3.8 mmol/L (ref 3.5–5.1)
Sodium: 138 mmol/L (ref 135–145)

## 2023-06-09 LAB — TYPE AND SCREEN
ABO/RH(D): O POS
Antibody Screen: NEGATIVE

## 2023-06-09 LAB — COMPREHENSIVE METABOLIC PANEL
ALT: 20 U/L (ref 0–44)
AST: 23 U/L (ref 15–41)
Albumin: 3.7 g/dL (ref 3.5–5.0)
Alkaline Phosphatase: 58 U/L (ref 38–126)
Anion gap: 12 (ref 5–15)
BUN: 21 mg/dL (ref 8–23)
CO2: 25 mmol/L (ref 22–32)
Calcium: 10 mg/dL (ref 8.9–10.3)
Chloride: 102 mmol/L (ref 98–111)
Creatinine, Ser: 1.21 mg/dL (ref 0.61–1.24)
GFR, Estimated: >60 mL/min (ref >60)
Glucose, Bld: 172 mg/dL — ABNORMAL HIGH (ref 70–99)
Potassium: 3.9 mmol/L (ref 3.5–5.1)
Sodium: 139 mmol/L (ref 135–145)
Total Bilirubin: 0.9 mg/dL (ref 0.0–1.2)
Total Protein: 6.9 g/dL (ref 6.5–8.1)

## 2023-06-09 LAB — ABO/RH: ABO/RH(D): O POS

## 2023-06-09 MED ORDER — TEMAZEPAM 15 MG PO CAPS: 15.0000 mg | ORAL_CAPSULE | Freq: Once | ORAL | Status: DC | PRN

## 2023-06-09 MED ORDER — CHLORHEXIDINE GLUCONATE 0.12 % MT SOLN
15.0000 mL | Freq: Once | OROMUCOSAL | Status: AC
  Filled 2023-06-09: qty 15

## 2023-06-09 MED ORDER — BISACODYL 5 MG PO TBEC
5.0000 mg | DELAYED_RELEASE_TABLET | Freq: Once | ORAL | Status: AC
  Administered 2023-06-09: 5 mg via ORAL
  Filled 2023-06-09: qty 1

## 2023-06-09 MED ORDER — MAGNESIUM SULFATE 50 % IJ SOLN
40.0000 meq | INTRAMUSCULAR | Status: DC
  Filled 2023-06-09: qty 9.85

## 2023-06-09 MED ORDER — NOREPINEPHRINE 4 MG/250ML-% IV SOLN
0.0000 ug/min | INTRAVENOUS | Status: AC
  Administered 2023-06-10: 1 ug/min via INTRAVENOUS
  Filled 2023-06-09: qty 250

## 2023-06-09 MED ORDER — CHLORHEXIDINE GLUCONATE 0.12 % MT SOLN
15.0000 mL | Freq: Once | OROMUCOSAL | Status: AC
  Administered 2023-06-10: 15 mL via OROMUCOSAL

## 2023-06-09 MED ORDER — CHLORHEXIDINE GLUCONATE 4 % EX SOLN
1.0000 | Freq: Once | CUTANEOUS | Status: DC
  Filled 2023-06-09: qty 60

## 2023-06-09 MED ORDER — CHLORHEXIDINE GLUCONATE 4 % EX SOLN
60.0000 mL | Freq: Once | CUTANEOUS | Status: AC
  Administered 2023-06-09: 4 via TOPICAL
  Filled 2023-06-09: qty 60

## 2023-06-09 MED ORDER — HEPARIN 30,000 UNITS/1000 ML (OHS) CELLSAVER SOLUTION
Status: DC
  Filled 2023-06-09: qty 1000

## 2023-06-09 MED ORDER — POTASSIUM CHLORIDE 2 MEQ/ML IV SOLN
80.0000 meq | INTRAVENOUS | Status: DC
  Filled 2023-06-09: qty 40

## 2023-06-09 MED ORDER — DEXMEDETOMIDINE HCL IN NACL 400 MCG/100ML IV SOLN
0.1000 ug/kg/h | INTRAVENOUS | Status: AC
  Administered 2023-06-10: 1 ug/kg/h via INTRAVENOUS
  Filled 2023-06-09: qty 100

## 2023-06-09 MED ORDER — CEFAZOLIN SODIUM-DEXTROSE 2-4 GM/100ML-% IV SOLN
2.0000 g | INTRAVENOUS | Status: AC
  Administered 2023-06-10: 2 g via INTRAVENOUS
  Filled 2023-06-09: qty 100

## 2023-06-09 MED ORDER — CHLORHEXIDINE GLUCONATE 4 % EX SOLN: 30.0000 mL | CUTANEOUS | Status: DC

## 2023-06-09 MED ORDER — CHLORHEXIDINE GLUCONATE 4 % EX SOLN
60.0000 mL | Freq: Once | CUTANEOUS | Status: AC
Start: 2023-06-10 — End: 2023-06-10
  Administered 2023-06-10: 4 via TOPICAL

## 2023-06-09 MED ORDER — SODIUM CHLORIDE 0.9 % IV SOLN
INTRAVENOUS | Status: DC
Start: 2023-06-10 — End: 2023-06-10

## 2023-06-09 NOTE — Plan of Care (Signed)
  Problem: Education: Goal: Knowledge of General Education information will improve Description: Including pain rating scale, medication(s)/side effects and non-pharmacologic comfort measures Outcome: Progressing   Problem: Health Behavior/Discharge Planning: Goal: Ability to manage health-related needs will improve Outcome: Progressing   Problem: Clinical Measurements: Goal: Ability to maintain clinical measurements within normal limits will improve Outcome: Progressing Goal: Will remain free from infection Outcome: Progressing Goal: Diagnostic test results will improve Outcome: Progressing Goal: Respiratory complications will improve Outcome: Progressing Goal: Cardiovascular complication will be avoided Outcome: Progressing   Problem: Nutrition: Goal: Adequate nutrition will be maintained Outcome: Progressing   Problem: Coping: Goal: Level of anxiety will decrease Outcome: Progressing   Problem: Elimination: Goal: Will not experience complications related to bowel motility Outcome: Progressing Goal: Will not experience complications related to urinary retention Outcome: Progressing   Problem: Pain Managment: Goal: General experience of comfort will improve and/or be controlled Outcome: Progressing   Problem: Skin Integrity: Goal: Risk for impaired skin integrity will decrease Outcome: Progressing   Problem: Activity: Goal: Risk for activity intolerance will decrease Outcome: Not Progressing   Problem: Safety: Goal: Ability to remain free from injury will improve Outcome: Not Progressing

## 2023-06-09 NOTE — Plan of Care (Signed)

## 2023-06-09 NOTE — Progress Notes (Addendum)
 [facility]   MULTIDISCIPLINARY HEART VALVE TEAM  Patient Name: Kenneth [name] Date of Encounter: 06/09/2023  Admit date: 06/04/2023  Primary Care Provider: Clinic, [facility] Cardiologist: Bryan Lemma, MD  [facility] Electrophysiologist:  None   Hospital Problem List     Principal Problem:   Severe aortic stenosis Active Problems:   Paroxysmal atrial fibrillation (HCC)   Chronic heart failure with preserved ejection fraction (HFpEF) (HCC)   ILD (interstitial lung disease) (HCC)   Coronary artery disease involving native coronary artery with angina pectoris (HCC)   Hyperlipidemia associated with type 2 diabetes mellitus (HCC)   Hypercoagulable state due to paroxysmal atrial fibrillation (HCC); CHA2DS2-VASc score 5   Acute on chronic diastolic heart failure (HCC)     Subjective   Has some mild shortness of breath while talking. On bed rest since syncopal episode on 3/1. Anxious to get valve replaced.   Inpatient Medications    Scheduled Meds:  dorzolamide-timolol  1 drop Both Eyes QHS   feeding supplement  237 mL Oral BID BM   ferrous sulfate  325 mg Oral Q M,W,F   furosemide  20 mg Oral Once per day on Monday Thursday   galantamine  24 mg Oral q AM   insulin aspart  0-15 Units Subcutaneous TID WC   loratadine  10 mg Oral Daily   memantine  20 mg Oral QHS   predniSONE  5 mg Oral Q breakfast   ranolazine  500 mg Oral BID   rosuvastatin  40 mg Oral QHS   Continuous Infusions:  PRN Meds: acetaminophen, nitroGLYCERIN, ondansetron (ZOFRAN) IV, mouth rinse, pantoprazole   Vital Signs    Vitals:   06/08/23 2053 06/08/23 2351 06/09/23 0337 06/09/23 0436  BP: 134/71 137/72 138/72   Pulse: 73 78 69 63  Resp: 18 17 15 15   Temp: 97.7 F (36.5 C) 97.6 F (36.4 C) 97.6 F (36.4 C)   TempSrc: Oral Oral Oral   SpO2: 95% 100% 97% 100%  Weight:    88.3 kg  Height:        Intake/Output Summary (Last 24 hours) at 06/09/2023  0807 Last data filed at 06/08/2023 1445 Gross per 24 hour  Intake 480 ml  Output 1600 ml  Net -1120 ml   Filed Weights   06/07/23 0323 06/08/23 0337 06/09/23 0436  Weight: 88.8 kg 87.9 kg 88.3 kg    Physical Exam    GEN: laying in bed. Mild sob while laying there. HEENT: Grossly normal.  Neck: Supple, no JVD or masses. Cardiac: RRR, 3/6 harsh SEM with obscured S2. No rubs, or gallops. No clubbing, cyanosis, edema.   Respiratory:  Respirations regular and unlabored, clear to auscultation bilaterally. GI: Soft, nontender, nondistended, BS + x 4. MS: no deformity or atrophy. Skin: warm and dry, no rash. Neuro:  Strength and sensation are intact. Psych: AAOx3.  Normal affect.  Labs    CBC No results for input(s): "WBC", "NEUTROABS", "HGB", "HCT", "MCV", "PLT" in the last 72 hours. Basic Metabolic Panel Recent Labs    13/24/40 0326 06/09/23 0331  NA 139 138  K 3.8 3.8  CL 106 104  CO2 22 24  GLUCOSE 112* 122*  BUN 21 24*  CREATININE 1.17 1.16  CALCIUM 9.4 9.6    Telemetry    Sinus with PACs, HR in 70s. Tele review fro m3/1 @ 9:30am showed a ventricular escape rhythm with HR in 30s (pt had syncope) - Personally Reviewed  ECG  NSR, nonspecific T wave abnormality 76 bpm- Personally Reviewed  Patient Profile     Kenneth [name] is a 78 y.o. male with a hx of paroxysmal atrial flutter s/p ablation 2006 & 2015, CAD s/p multiple PCIs, interstitial lung disease on chronic low dose prednisone, HTN, HLD, DMT2 and severe AS who was directly admitted from the office 06/04/23 due to acute dyspnea at rest.   Assessment & Plan    Shortness of Breath -- Likely multifactorial 2/2 chronic lung disease, CAD, severe aortic stenosis and anxiety.  -- 02 sats 100% on RA but pt feels much better when on nasal cannula 02.  -- CXR with no pulmonary edema and BNP normal.    Severe AS:  -- Plan for TAVR-TF with an 29 mm Edwards S3UR via the TF approach with Dr. Excell Seltzer and Laneta Simmers. --  Inpatient orders placed.  -- NPO after midnight.  Syncope:  -- Occurred 3/1 at 9:39am in the setting of standing up to change wall calendar.  -- Review of tele during that shows a ventricular escape rhythm with HRs in 30s. -- Felt to be 2/2 a combination of a vasovagal episode, bradycardia and severe aortic stenosis.   -- On bedrest until TAVR.   Chronic HFpEF -- Appears euvolemic.  -- CXR with no pulmonary edema. BNP normal. -- Continued on home Lasix 2x a week (m,th).   CAD -- History of multiple interventions in the past including prior stenting to RCA and LAD. Last intervention was PTCA with kissing balloon technique of in-stent restenosis of LAD-D2 bifurcation site in 06/2021.  -- R/LHC on 05/27/2023 showed widely patent stents to LAD and RCA with 50% in-stent restenosis of mid LAD at previous PTCA site. -- He continues to have some atypical chest pain but does not sound like angina. -- Continue Ranexa 500mg  twice daily. -- No aspirin due to need for Eliquis. -- Continue statin.   Paroxysmal Atrial Fibrillation/ Flutter -- S/p atrial flutter ablation in 2006 and atrial fibrillation ablation in 01/2014. -- Maintaining sinus rhythm. Rates in the 70s to 80s. -- Not on any AV nodal agents. -- Eliquis on hold for TAVR tomorrow.    Hypertension -- BP well controlled without any antihypertensives.   Hyperlipidemia -- Continue home Crestor 40mg  daily.   Type 2 Diabetes Mellitus -- Hemoglobin A1c 6.3% on 05/22/2023. -- Continue SSI. -- Hold home Jardiance and Metformin.   Interstitial Lung Disease -- Continue Prednisone 5mg  daily.   Plaque Psoriasis -- On Hadlima injections every 2 weeks. Last dose was 06/03/2023.   Memory Issues -- On Namenda and Galantamine at home. Can continue here. -- Of note, patient's wife states the [place] recently prescribed Hydroxyzine and Zoloft but patient has not started these. They would like to wait to start these until after TAVR so they were not  ordered.  Byrd Hesselbach, PA-C  06/09/2023, 8:07 AM  Pager [phone]  Patient seen, examined. Available data reviewed. Agree with findings, assessment, and plan as outlined by Carlean Jews, PA-C.  The patient is awake, alert, in no distress.  His wife is present at the bedside.  HEENT is normal, JVP is normal, lungs are clear bilaterally, heart is regular rate and rhythm with a 3/6 harsh crescendo decrescendo murmur at the right upper sternal border, abdomen is soft and nontender, extremities have no edema.  The patient had a syncopal episode 3/1 likely related to a combination of vasovagal event and severe aortic stenosis.  He otherwise continues to have shortness  of breath mostly with any physical exertion.  He remains on O2 per nasal cannula.  As previously outlined, we plan to proceed with TAVR tomorrow via transfemoral access.  The patient understands the risks, benefits, and alternatives to the procedure.  He is eager to proceed and he provides full informed consent.  Tonny Bollman, M.D. 06/09/2023 5:24 PM

## 2023-06-10 ENCOUNTER — Inpatient Hospital Stay (HOSPITAL_COMMUNITY)

## 2023-06-10 ENCOUNTER — Inpatient Hospital Stay (HOSPITAL_COMMUNITY): Admission: RE | Admit: 2023-06-10 | Payer: Medicare Other | Source: Home / Self Care | Admitting: Cardiovascular Disease

## 2023-06-10 ENCOUNTER — Encounter (HOSPITAL_COMMUNITY)
Admission: AD | Disposition: A | Discharge status: Home / Self Care | Payer: Medicare Other | Source: Ambulatory Visit | Attending: Cardiology

## 2023-06-10 ENCOUNTER — Inpatient Hospital Stay (HOSPITAL_COMMUNITY): Admitting: Anesthesiology

## 2023-06-10 ENCOUNTER — Encounter (HOSPITAL_COMMUNITY): Payer: Self-pay | Admitting: Cardiology

## 2023-06-10 DIAGNOSIS — G4733 Obstructive sleep apnea (adult) (pediatric): Secondary | ICD-10-CM

## 2023-06-10 DIAGNOSIS — I350 Nonrheumatic aortic (valve) stenosis: Secondary | ICD-10-CM

## 2023-06-10 DIAGNOSIS — Z006 Encounter for examination for normal comparison and control in clinical research program: Secondary | ICD-10-CM | POA: Diagnosis not present

## 2023-06-10 DIAGNOSIS — I2510 Atherosclerotic heart disease of native coronary artery without angina pectoris: Secondary | ICD-10-CM | POA: Diagnosis not present

## 2023-06-10 DIAGNOSIS — Z952 Presence of prosthetic heart valve: Secondary | ICD-10-CM | POA: Diagnosis not present

## 2023-06-10 DIAGNOSIS — I10 Essential (primary) hypertension: Secondary | ICD-10-CM

## 2023-06-10 HISTORY — PX: INTRAOPERATIVE TRANSTHORACIC ECHOCARDIOGRAM: SHX6523

## 2023-06-10 HISTORY — DX: Presence of prosthetic heart valve: Z95.2

## 2023-06-10 LAB — ECHOCARDIOGRAM LIMITED
AR max vel: 2.63 cm2
AV Area VTI: 2.87 cm2
AV Area mean vel: 1.52 cm2
AV Mean grad: 5 mmHg
AV Peak grad: 9.1 mmHg
Ao pk vel: 1.51 m/s
Area-P 1/2: 2.29 cm2
S' Lateral: 2.4 cm

## 2023-06-10 LAB — GLUCOSE, CAPILLARY
Glucose-Capillary: 119 mg/dL — ABNORMAL HIGH (ref 70–99)
Glucose-Capillary: 117 mg/dL — ABNORMAL HIGH (ref 70–99)
Glucose-Capillary: 127 mg/dL — ABNORMAL HIGH (ref 70–99)
Glucose-Capillary: 135 mg/dL — ABNORMAL HIGH (ref 70–99)
Glucose-Capillary: 142 mg/dL — ABNORMAL HIGH (ref 70–99)
Glucose-Capillary: 132 mg/dL — ABNORMAL HIGH (ref 70–99)
Glucose-Capillary: 136 mg/dL — ABNORMAL HIGH (ref 70–99)

## 2023-06-10 LAB — POCT I-STAT, CHEM 8
BUN: 15 mg/dL (ref 8–23)
Calcium, Ion: 1.13 mmol/L — ABNORMAL LOW (ref 1.15–1.40)
Chloride: 105 mmol/L (ref 98–111)
Creatinine, Ser: 0.9 mg/dL (ref 0.61–1.24)
Glucose, Bld: 158 mg/dL — ABNORMAL HIGH (ref 70–99)
HCT: 32 % — ABNORMAL LOW (ref 39.0–52.0)
Hemoglobin: 10.9 g/dL — ABNORMAL LOW (ref 13.0–17.0)
Potassium: 3.8 mmol/L (ref 3.5–5.1)
Sodium: 142 mmol/L (ref 135–145)
TCO2: 25 mmol/L (ref 22–32)

## 2023-06-10 LAB — MRSA NEXT GEN BY PCR, NASAL: MRSA by PCR Next Gen: NOT DETECTED

## 2023-06-10 LAB — CBC
HCT: 38.4 % — ABNORMAL LOW (ref 39.0–52.0)
Hemoglobin: 13.1 g/dL (ref 13.0–17.0)
MCH: 29.1 pg (ref 26.0–34.0)
MCHC: 34.1 g/dL (ref 30.0–36.0)
MCV: 85.3 fL (ref 80.0–100.0)
Platelets: 177 10*3/uL (ref 150–400)
RBC: 4.5 MIL/uL (ref 4.22–5.81)
RDW: 13.2 % (ref 11.5–15.5)
WBC: 7.4 10*3/uL (ref 4.0–10.5)
nRBC: 0 % (ref 0.0–0.2)

## 2023-06-10 SURGERY — TRANSCATHETER AORTIC VALVE REPLACEMENT, TRANSFEMORAL (CATHLAB)
Anesthesia: Monitor Anesthesia Care

## 2023-06-10 MED ORDER — CHLORHEXIDINE GLUCONATE 0.12 % MT SOLN
OROMUCOSAL | Status: AC
Start: 2023-06-10 — End: 2023-06-10
  Administered 2023-06-10: 15 mL
  Filled 2023-06-10: qty 15

## 2023-06-10 MED ORDER — CEFAZOLIN SODIUM-DEXTROSE 2-4 GM/100ML-% IV SOLN
2.0000 g | Freq: Three times a day (TID) | INTRAVENOUS | Status: AC
  Administered 2023-06-10 – 2023-06-11 (×2): 2 g via INTRAVENOUS
  Filled 2023-06-10 (×2): qty 100

## 2023-06-10 MED ORDER — LIDOCAINE HCL (PF) 1 % IJ SOLN
INTRAMUSCULAR | Status: DC | PRN
  Administered 2023-06-10: 20 mL

## 2023-06-10 MED ORDER — FENTANYL CITRATE (PF) 100 MCG/2ML IJ SOLN
INTRAMUSCULAR | Status: AC
  Filled 2023-06-10: qty 2

## 2023-06-10 MED ORDER — LACTATED RINGERS IV SOLN: INTRAVENOUS | Status: DC

## 2023-06-10 MED ORDER — HEPARIN (PORCINE) IN NACL 1000-0.9 UT/500ML-% IV SOLN
INTRAVENOUS | Status: DC | PRN
  Administered 2023-06-10: 1500 mL

## 2023-06-10 MED ORDER — IODIXANOL 320 MG/ML IV SOLN
INTRAVENOUS | Status: DC | PRN
  Administered 2023-06-10: 40 mL via INTRA_ARTERIAL

## 2023-06-10 MED ORDER — SODIUM CHLORIDE 0.9 % IV SOLN: INTRAVENOUS | Status: DC | PRN

## 2023-06-10 MED ORDER — NITROGLYCERIN IN D5W 200-5 MCG/ML-% IV SOLN: 0.0000 ug/min | INTRAVENOUS | Status: DC

## 2023-06-10 MED ORDER — SODIUM CHLORIDE 0.9 % IV SOLN: 250.0000 mL | INTRAVENOUS | Status: DC | PRN

## 2023-06-10 MED ORDER — PROTAMINE SULFATE 10 MG/ML IV SOLN
INTRAVENOUS | Status: AC
  Filled 2023-06-10: qty 5

## 2023-06-10 MED ORDER — SODIUM CHLORIDE 0.9% FLUSH
3.0000 mL | Freq: Two times a day (BID) | INTRAVENOUS | Status: DC
  Administered 2023-06-11 (×2): 3 mL via INTRAVENOUS

## 2023-06-10 MED ORDER — SODIUM CHLORIDE 0.9% FLUSH: 3.0000 mL | INTRAVENOUS | Status: DC | PRN

## 2023-06-10 MED ORDER — LIDOCAINE HCL (PF) 1 % IJ SOLN
INTRAMUSCULAR | Status: AC
  Filled 2023-06-10: qty 30

## 2023-06-10 MED ORDER — PROTAMINE SULFATE 10 MG/ML IV SOLN
INTRAVENOUS | Status: DC | PRN
  Administered 2023-06-10: 130 mg via INTRAVENOUS

## 2023-06-10 MED ORDER — HEPARIN SODIUM (PORCINE) 1000 UNIT/ML IJ SOLN
INTRAMUSCULAR | Status: AC
  Filled 2023-06-10: qty 10

## 2023-06-10 MED ORDER — SODIUM CHLORIDE 0.9 % IV SOLN: INTRAVENOUS | Status: AC

## 2023-06-10 MED ORDER — PROPOFOL 10 MG/ML IV BOLUS
INTRAVENOUS | Status: DC | PRN
  Administered 2023-06-10 (×4): 10 mg via INTRAVENOUS

## 2023-06-10 MED ORDER — FENTANYL CITRATE (PF) 100 MCG/2ML IJ SOLN
INTRAMUSCULAR | Status: DC | PRN
  Administered 2023-06-10: 50 ug via INTRAVENOUS
  Administered 2023-06-10 (×2): 25 ug via INTRAVENOUS

## 2023-06-10 MED ORDER — HEPARIN SODIUM (PORCINE) 1000 UNIT/ML IJ SOLN
INTRAMUSCULAR | Status: DC | PRN
  Administered 2023-06-10: 13000 [IU] via INTRAVENOUS

## 2023-06-10 MED ORDER — PHENYLEPHRINE 80 MCG/ML (10ML) SYRINGE FOR IV PUSH (FOR BLOOD PRESSURE SUPPORT)
PREFILLED_SYRINGE | INTRAVENOUS | Status: DC | PRN
  Administered 2023-06-10: 80 ug via INTRAVENOUS
  Administered 2023-06-10: 40 ug via INTRAVENOUS

## 2023-06-10 SURGICAL SUPPLY — 29 items
BAG SNAP BAND KOVER 36X36 (MISCELLANEOUS) ×4 IMPLANT
CABLE ADAPT PACING TEMP 12FT (ADAPTER) IMPLANT
CATH COMMANDER DELIVERY SYS 29 (CATHETERS) IMPLANT
CATH DIAG 6FR PIGTAIL ANGLED (CATHETERS) IMPLANT
CATH INFINITI 5FR ANG PIGTAIL (CATHETERS) IMPLANT
CATH INFINITI 6F AL2 (CATHETERS) IMPLANT
CATH S G BIP PACING (CATHETERS) IMPLANT
CLOSURE MYNX CONTROL 6F/7F (Vascular Products) IMPLANT
CLOSURE PERCLOSE PROSTYLE (VASCULAR PRODUCTS) IMPLANT
CRIMPER (MISCELLANEOUS) IMPLANT
DEVICE INFLATION ATRION QL38 (MISCELLANEOUS) IMPLANT
KIT MICROPUNCTURE NIT STIFF (SHEATH) IMPLANT
KIT SAPIAN 3 ULTRA RESILIA 29 (Valve) IMPLANT
PACK CARDIAC CATHETERIZATION (CUSTOM PROCEDURE TRAY) ×2 IMPLANT
PAD SORBX EP SHIELD 16.5X12 (MISCELLANEOUS) IMPLANT
SET ATX-X65L (MISCELLANEOUS) IMPLANT
SHEATH CATAPULT 7FR 45 (SHEATH) IMPLANT
SHEATH INTRODUCER SET 29 (SHEATH) IMPLANT
SHEATH PINNACLE 6F 10CM (SHEATH) IMPLANT
SHEATH PINNACLE 8F 10CM (SHEATH) IMPLANT
SHEATH PROBE COVER 6X72 (BAG) IMPLANT
STOPCOCK MORSE 400PSI 3WAY (MISCELLANEOUS) ×4 IMPLANT
TRANSDUCER W/STOPCOCK (MISCELLANEOUS) IMPLANT
TUBING ART PRESS 72 MALE/FEM (TUBING) IMPLANT
WIRE AMPLATZ SS-J .035X180CM (WIRE) IMPLANT
WIRE EMERALD 3MM-J .035X150CM (WIRE) IMPLANT
WIRE EMERALD 3MM-J .035X260CM (WIRE) IMPLANT
WIRE EMERALD ST .035X260CM (WIRE) IMPLANT
WIRE SAFARI SM CURVE 275 (WIRE) IMPLANT

## 2023-06-10 NOTE — Anesthesia Preprocedure Evaluation (Signed)
 Anesthesia Evaluation  Patient identified by MRN, date of birth, ID band Patient awake    Reviewed: Allergy & Precautions, H&P , NPO status , Patient's Chart, lab work & pertinent test results  History of Anesthesia Complications (+) PONV and history of anesthetic complications  Airway Mallampati: II   Neck ROM: full    Dental   Pulmonary sleep apnea , former smoker   breath sounds clear to auscultation       Cardiovascular hypertension, + angina  + CAD and + Cardiac Stents  + dysrhythmias Atrial Fibrillation + Valvular Problems/Murmurs AS  Rhythm:regular Rate:Normal  TTE (05/14/23): EF 60%, severe AS with mean PG 42 mmHg.   Neuro/Psych  Neuromuscular disease    GI/Hepatic hiatal hernia,GERD  ,,  Endo/Other  diabetes, Type 2    Renal/GU      Musculoskeletal  (+) Arthritis ,    Abdominal   Peds  Hematology   Anesthesia Other Findings   Reproductive/Obstetrics                             Anesthesia Physical Anesthesia Plan  ASA: 3  Anesthesia Plan: MAC   Post-op Pain Management:    Induction: Intravenous  PONV Risk Score and Plan: 2 and Ondansetron, Treatment may vary due to age or medical condition and Dexamethasone  Airway Management Planned: Simple Face Mask  Additional Equipment:   Intra-op Plan:   Post-operative Plan:   Informed Consent: I have reviewed the patients History and Physical, chart, labs and discussed the procedure including the risks, benefits and alternatives for the proposed anesthesia with the patient or authorized representative who has indicated his/her understanding and acceptance.     Dental advisory given  Plan Discussed with: CRNA, Anesthesiologist and Surgeon  Anesthesia Plan Comments:        Anesthesia Quick Evaluation

## 2023-06-10 NOTE — Interval H&P Note (Signed)
 History and Physical Interval Note:  06/10/2023 1:01 PM  Kenneth [name]  has presented today for surgery, with the diagnosis of Severe Aortic Stenosis.  The various methods of treatment have been discussed with the patient and family. After consideration of risks, benefits and other options for treatment, the patient has consented to  Procedure(s): Transcatheter Aortic Valve Replacement, Transfemoral (N/A) INTRAOPERATIVE TRANSTHORACIC ECHOCARDIOGRAM (N/A) as a surgical intervention.  The patient's history has been reviewed, patient examined, no change in status, stable for surgery.  I have reviewed the patient's chart and labs.  Questions were answered to the patient's satisfaction.     [name]

## 2023-06-10 NOTE — Progress Notes (Signed)
  Echocardiogram 2D Echocardiogram has been performed.  Delcie Roch 06/10/2023, 4:34 PM

## 2023-06-10 NOTE — Op Note (Signed)
 [facility]ART [facility] VASC[facility]ENTER   MULTIDISCIPLINARY HEART VALVE TEAM   TAVR OPERATIVE NOTE   Date of Procedure:  06/10/2023  Preoperative Diagnosis: Severe Aortic Stenosis   Postoperative Diagnosis: Same   Procedure:   Transcatheter Aortic Valve Replacement - Percutaneous Transfemoral Approach  Edwards Sapien 3 Ultra Resilia THV (size 29 mm, serial # [id number])   Co-Surgeons:  Evelene Croon, MD and Tonny Bollman, MD  Anesthesiologist:  Dr Mal Amabile  Echocardiographer:  Dr Flora Lipps  Pre-operative Echo Findings: Severe aortic stenosis Normal left ventricular systolic function  Post-operative Echo Findings: Trivial paravalvular leak Normal/unchanged left ventricular systolic function  BRIEF CLINICAL NOTE AND INDICATIONS FOR SURGERY  78 year old male admitted with progressive dyspnea and class IV symptoms of diastolic heart failure is found to have severe aortic stenosis.  He undergoes appropriate preoperative assessment with right and left heart catheterization as well as CT angiography studies.  He presents today for inpatient TAVR.  During the course of the patient's preoperative work up they have been evaluated comprehensively by a multidisciplinary team of specialists coordinated through the Multidisciplinary Heart Valve Clinic in the [facility].  They have been demonstrated to suffer from symptomatic severe aortic stenosis as noted above. The patient has been counseled extensively as to the relative risks and benefits of all options for the treatment of severe aortic stenosis including long term medical therapy, conventional surgery for aortic valve replacement, and transcatheter aortic valve replacement.  The patient has been independently evaluated in formal cardiac surgical consultation by Dr Laneta Simmers, who deemed the patient appropriate for TAVR. Based upon review of all of the patient's preoperative diagnostic tests they are felt to be candidate for transcatheter  aortic valve replacement using the transfemoral approach as an alternative to conventional surgery.    Following the decision to proceed with transcatheter aortic valve replacement, a discussion has been held regarding what types of management strategies would be attempted intraoperatively in the event of life-threatening complications, including whether or not the patient would be considered a candidate for the use of cardiopulmonary bypass and/or conversion to open sternotomy for attempted surgical intervention.  The patient has been advised of a variety of complications that might develop peculiar to this approach including but not limited to risks of death, stroke, paravalvular leak, aortic dissection or other major vascular complications, aortic annulus rupture, device embolization, cardiac rupture or perforation, acute myocardial infarction, arrhythmia, heart block or bradycardia requiring permanent pacemaker placement, congestive heart failure, respiratory failure, renal failure, pneumonia, infection, other late complications related to structural valve deterioration or migration, or other complications that might ultimately cause a temporary or permanent loss of functional independence or other long term morbidity.  The patient provides full informed consent for the procedure as described and all questions were answered preoperatively.  DETAILS OF THE OPERATIVE PROCEDURE  PREPARATION:   The patient is brought to the operating room on the above mentioned date and central monitoring was established by the anesthesia team. The patient is placed in the supine position on the operating table.  Intravenous antibiotics are administered. The patient is monitored closely throughout the procedure under conscious sedation.    Baseline transthoracic echocardiogram is performed. The patient's chest, abdomen, both groins, and both lower extremities are prepared and draped in a sterile manner. A time out procedure  is performed.   PERIPHERAL ACCESS:   Using ultrasound guidance, femoral arterial and venous access is obtained with placement of 6 Fr sheaths on the left side.  [place] images  are digitally captured and stored in the patient's chart. A pigtail diagnostic catheter was passed through the femoral arterial sheath under fluoroscopic guidance into the aortic root.  A temporary transvenous pacemaker catheter was passed through the femoral venous sheath under fluoroscopic guidance into the right ventricle.  The pacemaker was tested to ensure stable lead placement and pacemaker capture. Aortic root angiography was performed in order to determine the optimal angiographic angle for valve deployment.  TRANSFEMORAL ACCESS:  A micropuncture technique is used to access the right femoral artery under fluoroscopic and ultrasound guidance.  2 Perclose devices are deployed at 10' and 2' positions to 'PreClose' the femoral artery. An 8 French sheath is placed and then an Amplatz Superstiff wire is advanced through the sheath. This is changed out for a 16 French transfemoral E-Sheath after progressively dilating over the Superstiff wire.  An AL-2 catheter was used to direct a straight-tip exchange length wire across the native aortic valve into the left ventricle. This was exchanged out for a pigtail catheter and position was confirmed in the LV apex. Simultaneous LV and Ao pressures were recorded.  The pigtail catheter was exchanged for a Safari wire in the LV apex.    BALLOON AORTIC VALVULOPLASTY:  Not performed  TRANSCATHETER HEART VALVE DEPLOYMENT:  An Edwards Sapien 3 Ultra Resilia transcatheter heart valve (size 29 mm) was prepared and crimped per manufacturer's guidelines, and the proper orientation of the valve is confirmed on the [employer] delivery system. The valve was advanced through the introducer sheath using normal technique until in an appropriate position in the abdominal aorta beyond the sheath tip. The  balloon was then retracted and using the fine-tuning wheel was centered on the valve. The valve was then advanced across the aortic arch using appropriate flexion of the catheter. The valve was carefully positioned across the aortic valve annulus. The Commander catheter was retracted using normal technique. Once final position of the valve has been confirmed by angiographic assessment, the valve is deployed while temporarily holding ventilation and during rapid ventricular pacing to maintain systolic blood pressure < 50 mmHg and pulse pressure < 10 mmHg. The balloon inflation is held for >3 seconds after reaching full deployment volume. Once the balloon has fully deflated the balloon is retracted into the ascending aorta and valve function is assessed using echocardiography. The patient's hemodynamic recovery following valve deployment is good.  The deployment balloon and guidewire are both removed. Echo demostrated acceptable post-procedural gradients, stable mitral valve function, and trace aortic insufficiency.    PROCEDURE COMPLETION:  The sheath was removed and femoral artery closure is performed using the 2 previously deployed Perclose devices.  Protamine is administered once femoral arterial repair was complete. The site is clear with no evidence of bleeding or hematoma after the sutures are tightened. The temporary pacemaker and pigtail catheters are removed. Mynx closure is used for contralateral femoral arterial hemostasis for the 6 Fr sheath.  The patient tolerated the procedure well and is transported to the recovery area in stable condition. There were no immediate intraoperative complications. All sponge instrument and needle counts are verified correct at completion of the operation.   The patient received a total of 40 mL of intravenous contrast during the procedure.  EBL: minimal  LVEDP: 3 mmHg   Tonny Bollman, MD 06/10/2023 5:26 PM

## 2023-06-10 NOTE — Progress Notes (Signed)
  [facility] VASC[facility]ENTER   MULTIDISCIPLINARY HEART VALVE TEAM  Patient doing well s/p TAVR. He is hemodynamically stable but BPs are soft.  He is still quite groggy but alert and oriented. Groin sites stable. ECG with sinus with 1st deg AV block and PACs but no high grade block. Plan to transfer to from cath lab holding to 4E when bed available.  Early ambulation after bedrest completed and hopeful discharge over the next 24-48 hours.   Cline Crock PA-C  [facility]  Pager [phone]

## 2023-06-10 NOTE — Progress Notes (Signed)
 Patient received from Cathlab, vital signs obtained, CCMD notified, both groin are level 0, no any signs of hematoma, call bell in reach.   06/10/23 1748  Vitals  Temp (!) 97.5 F (36.4 C)  Temp Source Axillary  BP 92/61  MAP (mmHg) 71  BP Location Left Arm  BP Method Automatic  Patient Position (if appropriate) Lying  Pulse Rate 86  Pulse Rate Source Monitor  ECG Heart Rate 84  Resp 19  Oxygen Therapy  SpO2 96 %  O2 Device Room Air  Pain Assessment  Pain Scale 0-10  Pain Score 0

## 2023-06-10 NOTE — Op Note (Signed)
 [facility] VASCULAR [facility]ENTER   MULTIDISCIPLINARY HEART VALVE TEAM   TAVR OPERATIVE NOTE   Date of Procedure:  06/10/2023  Preoperative Diagnosis: Severe Aortic Stenosis   Postoperative Diagnosis: Same   Procedure:   Transcatheter Aortic Valve Replacement - Percutaneous Right Transfemoral Approach  Edwards Sapien 3 Ultra Resilia THV (size 29 mm, model # 9755RSL, serial # [id number])   Co-Surgeons:  [name], MD and Tonny Bollman, MD    Anesthesiologist:  Jerrye Noble, MD  Echocardiographer:  Lacretia Nicks. O'Neal, MD  Pre-operative Echo Findings: Severe aortic stenosis Normal left ventricular systolic function  Post-operative Echo Findings: Trivial paravalvular leak Normal left ventricular systolic function   BRIEF CLINICAL NOTE AND INDICATIONS FOR SURGERY  This 78 year old gentleman has stage D, severe, symptomatic aortic stenosis with NYHA class III-IV symptoms of exertional shortness of breath and fatigue as well as left-sided chest discomfort consistent with chronic diastolic congestive heart failure.  He presented to the office today acutely short of breath although his oxygen saturation has been 100% on room air and his vital signs been stable.  I have personally reviewed his 2D echocardiogram, cardiac catheterization, and CTA studies.  His echocardiogram shows a trileaflet aortic valve with severe calcification and restricted leaflet mobility.  The mean gradient is 42 mmHg with a valve area of 0.88 cm by VTI.  Left ventricular systolic function is normal.  Cardiac catheterization showed about 50% mid LAD in-stent restenosis involving the ostium of a moderate-sized second diagonal branch.  Right heart pressures were within normal limits.   I agree that aortic valve replacement is indicated in this patient for relief of his symptoms and to prevent progressive left ventricular deterioration.  I think TAVR would be the best option for treating him given his age and comorbidities.  He is  acutely short of breath and very fatigued today after walking into the office and his wife said the symptoms have been worsening over the past few weeks and have prompted admission to [facility].  He does have some interstitial lung disease on low-dose prednisone at home as well as a moderate LAD restenosis that I would not expect to be causing the symptoms.  His CTA of the chest yesterday was not time to look at the pulmonary arteries but I do not see any signs of emboli within the main pulmonary artery or its branches as well as the lobar pulmonary arteries.  His oxygen saturations have been 100% on room air which would make pulmonary emboli unlikely.  I think it would be best to directly admit him to the hospital today for continued observation and management for severe symptomatic aortic stenosis.  His gated cardiac CTA shows anatomy suitable for TAVR using a 29 mm SAPIEN 3 valve.  His abdominal and pelvic CTA shows adequate pelvic vascular anatomy to allow transfemoral insertion.   The patient and his wife were counseled at length regarding treatment alternatives for management of severe symptomatic aortic stenosis. The risks and benefits of surgical intervention has been discussed in detail. Long-term prognosis with medical therapy was discussed. Alternative approaches such as conventional surgical aortic valve replacement, transcatheter aortic valve replacement, and palliative medical therapy were compared and contrasted at length. This discussion was placed in the context of the patient's own specific clinical presentation and past medical history. All of their questions have been addressed.    Following the decision to proceed with transcatheter aortic valve replacement, a discussion was held regarding what types of management strategies would be attempted  intraoperatively in the event of life-threatening complications, including whether or not the patient would be considered a candidate for the use of  cardiopulmonary bypass and/or conversion to open sternotomy for attempted surgical intervention.  I think he would be a candidate for emergent sternotomy to manage any intraoperative complications.  The patient is aware of the fact that transient use of cardiopulmonary bypass may be necessary. The patient has been advised of a variety of complications that might develop including but not limited to risks of death, stroke, paravalvular leak, aortic dissection or other major vascular complications, aortic annulus rupture, device embolization, cardiac rupture or perforation, mitral regurgitation, acute myocardial infarction, arrhythmia, heart block or bradycardia requiring permanent pacemaker placement, congestive heart failure, respiratory failure, renal failure, pneumonia, infection, other late complications related to structural valve deterioration or migration, or other complications that might ultimately cause a temporary or permanent loss of functional independence or other long term morbidity. The patient provides full informed consent for the procedure as described and all questions were answered.          DETAILS OF THE OPERATIVE PROCEDURE  PREPARATION:    The patient was brought to the operating room on the above mentioned date and appropriate monitoring was established by the anesthesia team. The patient was placed in the supine position on the operating table.  Intravenous antibiotics were administered. The patient was monitored closely throughout the procedure under conscious sedation.   Baseline transthoracic echocardiogram was performed. The patient's abdomen and both groins were prepped and draped in a sterile manner. A time out procedure was performed.   PERIPHERAL ACCESS:    Using the modified Seldinger technique, femoral arterial and venous access was obtained with placement of 6 Fr sheaths on the left side.  A pigtail diagnostic catheter was passed through the left arterial sheath  under fluoroscopic guidance into the aortic root.  A temporary transvenous pacemaker catheter was passed through the left femoral venous sheath under fluoroscopic guidance into the right ventricle.  The pacemaker was tested to ensure stable lead placement and pacemaker capture. Aortic root angiography was performed in order to determine the optimal angiographic angle for valve deployment.   TRANSFEMORAL ACCESS:   Percutaneous transfemoral access and sheath placement was performed using ultrasound guidance.  The right common femoral artery was cannulated using a micropuncture needle and appropriate location was verified using hand injection angiogram.  A pair of Abbott Perclose percutaneous closure devices were placed and a 6 French sheath replaced into the femoral artery.  The patient was heparinized systemically and ACT verified > 250 seconds.    A 16 Fr transfemoral E-sheath was introduced into the right common femoral artery after progressively dilating over an Amplatz superstiff wire. An AL-2 catheter was used to direct a straight-tip exchange length wire across the native aortic valve into the left ventricle. This was exchanged out for a pigtail catheter and position was confirmed in the LV apex. Simultaneous LV and Ao pressures were recorded.  The pigtail catheter was exchanged for a Safari wire in the LV apex.   BALLOON AORTIC VALVULOPLASTY:   Not performed    TRANSCATHETER HEART VALVE DEPLOYMENT:   An Edwards Sapien 3 Ultra transcatheter heart valve (size 29 mm) was prepared and crimped per manufacturer's guidelines, and the proper orientation of the valve is confirmed on the [employer] delivery system. The valve was advanced through the introducer sheath using normal technique until in an appropriate position in the abdominal aorta beyond the sheath tip. The  balloon was then retracted and using the fine-tuning wheel was centered on the valve. The valve was then advanced across the  aortic arch using appropriate flexion of the catheter. The valve was carefully positioned across the aortic valve annulus. The Commander catheter was retracted using normal technique. Once final position of the valve has been confirmed by angiographic assessment, the valve is deployed during rapid ventricular pacing to maintain systolic blood pressure < 50 mmHg and pulse pressure < 10 mmHg. The balloon inflation is held for >3 seconds after reaching full deployment volume. Once the balloon has fully deflated the balloon is retracted into the ascending aorta and valve function is assessed using echocardiography. There is felt to be trivial paravalvular leak and no central aortic insufficiency.  The patient's hemodynamic recovery following valve deployment is good.  The deployment balloon and guidewire are both removed.    PROCEDURE COMPLETION:   The sheath was removed and femoral artery closure performed.  Protamine was administered once femoral arterial repair was complete. The temporary pacemaker, pigtail catheter and femoral sheaths were removed with manual pressure used for venous hemostasis.  A Mynx femoral closure device was utilized following removal of the diagnostic sheath in the left femoral artery.  The patient tolerated the procedure well and is transported to the cath lab recovery area in stable condition. There were no immediate intraoperative complications. All sponge instrument and needle counts are verified correct at completion of the operation.   No blood products were administered during the operation.  The patient received a total of 40 mL of intravenous contrast during the procedure.   [name], MD 06/10/2023 5:07 PM

## 2023-06-10 NOTE — Transfer of Care (Signed)
 Immediate Anesthesia Transfer of Care Note  Patient: Kenneth [name]  Procedure(s) Performed: Transcatheter Aortic Valve Replacement, Transfemoral INTRAOPERATIVE TRANSTHORACIC ECHOCARDIOGRAM  Patient Location: Cath Lab  Anesthesia Type:MAC  Level of Consciousness: awake, alert , oriented, and patient cooperative  Airway & Oxygen Therapy: Patient Spontanous Breathing and Patient connected to nasal cannula oxygen  Post-op Assessment: Report given to RN, Post -op Vital signs reviewed and stable, Patient moving all extremities X 4, and Patient able to stick tongue midline  Post vital signs: Reviewed and stable  Last Vitals:  Vitals Value Taken Time  BP 126/64 06/10/23 1653  Temp 36.7 C 06/10/23 1655  Pulse 84 06/10/23 1656  Resp 15 06/10/23 1656  SpO2 94 % 06/10/23 1656  Vitals shown include unfiled device data.  Last Pain:  Vitals:   06/10/23 1655  TempSrc: Temporal  PainSc: Asleep         Complications: There were no known notable events for this encounter.

## 2023-06-10 NOTE — Progress Notes (Signed)
 Patient has been transferred to the sort stay for the procedure at 1045am, report given to short stay RN.

## 2023-06-10 NOTE — Discharge Summary (Incomplete)
 [facility]   MULTIDISCIPLINARY HEART VALVE TEAM  Discharge Summary    Patient ID: Kenneth [name] MRN: [id number]; DOB: 1946/03/07  Admit date: 06/04/2023 Discharge date: 06/11/2023  Primary Care Provider: [facility]  Primary Cardiologist: Bryan Lemma, MD / Dr. Excell Seltzer & Dr. Laneta Simmers (TAVR)  Discharge Diagnoses    Principal Problem:   S/P TAVR (transcatheter aortic valve replacement) Active Problems:   Paroxysmal atrial fibrillation (HCC)   Chronic heart failure with preserved ejection fraction (HFpEF) (HCC)   ILD (interstitial lung disease) (HCC)   Coronary artery disease involving native coronary artery with angina pectoris (HCC)   Hyperlipidemia associated with type 2 diabetes mellitus (HCC)   Hypercoagulable state due to paroxysmal atrial fibrillation (HCC); CHA2DS2-VASc score 5   Severe aortic stenosis   Allergies Allergies  Allergen Reactions   Hydrocodone Shortness Of Breath    In combination with decongestants   Other Palpitations and Other (See Comments)    Reaction to all decongestants - palpitations, irregular heart rhythm   Oxycontin [Oxycodone] Shortness Of Breath   Sudafed [Pseudoephedrine] Palpitations   Ultram [Tramadol] Shortness Of Breath, Rash and Other (See Comments)    Flushing    Lopid [Gemfibrozil] Other (See Comments)    Dizziness      Diagnostic Studies/Procedures    TAVR OPERATIVE NOTE     Date of Procedure:                06/10/2023   Preoperative Diagnosis:      Severe Aortic Stenosis    Postoperative Diagnosis:    Same    Procedure:        Transcatheter Aortic Valve Replacement - Percutaneous Transfemoral Approach             Edwards Sapien 3 Ultra Resilia THV (size 29 mm, serial # [id number])              Co-Surgeons:                        Evelene Croon, MD and Tonny Bollman, MD   Anesthesiologist:                  Dr Mal Amabile   Echocardiographer:              Dr Flora Lipps   Pre-operative Echo  Findings: Severe aortic stenosis Normal left ventricular systolic function   Post-operative Echo Findings: Trivial paravalvular leak Normal/unchanged left ventricular systolic function  _____________    Echo 06/11/23: completed but pending formal read at the time of discharge   History of Present Illness     Kenneth [name] is a 78 y.o. male with a hx of paroxysmal atrial flutter s/p ablation 2006 & 2015, CAD s/p multiple PCIs, interstitial lung disease on chronic low dose prednisone, HTN, HLD, DMT2 and severe AS who was directly admitted from the office 06/04/23 due to acute dyspnea at rest.   Patient had dyspnea on exertion in 2018 and underwent L/RHC that showed mild pulmonary hypertension, severe 95% mid RCA lesion treated with DES, 70% proximal to mid LAD lesion with FFR of 0.75 treated with staged PCI DES, 60% distal left circumflex lesion, 55% OM 3 lesion. Repeat L/RHC performed in 2022 showed a 70% mid to distal LAD lesion with FFR 0.75 treated with Synergy DES and 60% ostial D1 lesion treated with PTCA. Repeat L/RHC in 2023 showed significant in-stent restenosis at LAD-D2 bifurcation site treated with PTCA with kissing balloon  technique. He has a history of ILD followed closely by Dr. Vassie Loll. He is maintained on low dose chronic prednisone with escalations during acute exacerbations. He was recently noted to have a worsening murmur by Dr. Vassie Loll and echo was ordered. Echo 05/14/2023 showed EF 60-65%, G2DD, mild MR, moderate TR, mild AI and severe aortic stenosis with a mean grad 42 mmHg, peak grad 56 mmHg, [name] 0.78 cm2, DVI 0.25, SVI 31. He underwent L/RHC on 05/27/2023 which showed 50% mid LAD in-stent restenosis, 50% stenosis in sidebranch in D2, 45% distal left circumflex lesion, 50% RPDA lesion, wedge pressure 10 mmHg, PA pressure 28/14 with mean 22 mmHg. Patient was referred to Dr. Excell Seltzer for structural heart consultation. Before being seen by the Structural Heart Team, he was admitted from  06/02/2023 to 06/03/2023 for persistent fatigue, atypical chest pain, and dyspnea. High-sensitivity troponin negative. BNP normal. He did not appear to volume overloaded and shortness of breath was not felt to be solely cardiac in nature. He was seen by Dr. Excell Seltzer inpatient and completed TAVR workup. Gated CTA showed tricuspid aortic valve with severely reduced cusp excursion, aortic valve calcium score of 3744, and an annular area of 574 mm, suitable for a 29 mm Edwards SAPIEN 3 valve. He was discharged with plans to see Dr. Laneta Simmers on 06/04/23. At his appointment, noted to be acute short of breath. Therefore, he was directly admitted to the hospital with plans to keep him inpatient until his TAVR on 06/10/23.   Hospital Course     Consultants: none   Severe AS:  -- S/p successful TAVR with a 29 mm Edwards Sapien 3 Ultra Resilia  THV via the TF approach on 06/10/23.  -- Post operative echo completed but pending formal read. -- Groin sites are stable.  -- ECG with sinus and no high grade heart block. -- Resume home Eliquis 5mg  BID. -- Met with cardiac rehab to discuss CRP phase II.  -- Plan for discharge home today with close follow up in the outpatient setting.   Shortness of Breath -- Likely multifactorial 2/2 chronic lung disease, CAD, severe aortic stenosis and anxiety.  -- No evidence of heart failure -- Resolved s/p TAVR.  Syncope:  -- Occurred 3/1 at 9:39am in the setting of standing up to change wall calendar.  -- Review of tele during that shows a ventricular escape rhythm with HRs in 30s. -- Felt to be 2/2 a combination of a vasovagal episode, bradycardia and severe aortic stenosis.     Chronic HFpEF -- Appears euvolemic.  -- CXR with no pulmonary edema. -- BNP normal. LVEDP 3 at the time of TAVR. -- Continued on home Lasix 2x a week (m,th).   CAD -- History of multiple interventions in the past including prior stenting to RCA and LAD. Last intervention was PTCA with kissing  balloon technique of in-stent restenosis of LAD-D2 bifurcation site in 06/2021.  -- R/LHC on 05/27/2023 showed widely patent stents to LAD and RCA with 50% in-stent restenosis of mid LAD at previous PTCA site. -- He continues to have some atypical chest pain not consistent with angina. -- Continue Ranexa 500mg  twice daily. -- No aspirin due to need for Eliquis. -- Continue statin.   Paroxysmal Atrial Fibrillation/ Flutter -- S/p atrial flutter ablation in 2006 and atrial fibrillation ablation in 01/2014. -- Maintaining sinus rhythm. Rates in the 70s to 80s. -- Not on any AV nodal agents. -- Resume home Eliquis 5mg  BID.  TAA: -- 4.0cm. Noted on pre  TAVR scans -- This will be followed over time.    Hypertension -- BP well controlled without any antihypertensives.   Hyperlipidemia -- Continue home Crestor 40mg  daily.   Type 2 Diabetes Mellitus -- Hemoglobin A1c 6.3% on 05/22/2023. -- Treated with SSI  -- Resume home Jardiance    Interstitial Lung Disease -- Continue Prednisone 5mg  daily.   Plaque Psoriasis -- On Hadlima injections every 2 weeks.    Memory Issues -- Continued on Namenda and Galantamine.  _____________  Discharge Vitals Blood pressure 129/62, pulse 67, temperature 98.1 F (36.7 C), temperature source Oral, resp. rate 14, height 6\' 3"  (1.905 m), weight 90.7 kg, SpO2 100%.  Filed Weights   06/09/23 0436 06/10/23 0500 06/11/23 0500  Weight: 88.3 kg 89 kg 90.7 kg    GEN: Well nourished, well developed, in no acute distress HEENT: normal Neck: no JVD or masses Cardiac: RRR; no murmurs, rubs, or gallops,no edema  Respiratory:  clear to auscultation bilaterally, normal work of breathing GI: soft, nontender, nondistended, + BS MS: no deformity or atrophy Skin: warm and dry, no rash.  Groin sites clear without hematoma or ecchymosis  Neuro:  Alert and Oriented x 3, Strength and sensation are intact Psych: euthymic mood, full affect  Disposition   Pt is  being discharged home today in good condition.  Follow-up Plans & Appointments     Follow-up Information     Janetta Hora, PA-C. Go on 06/16/2023.   Specialties: Cardiology, Radiology Why: @ 9:05 am, please arrive at least 15 minutues early. Contact information: [address] [place] [address] [phone]                  Discharge Medications   Allergies as of 06/11/2023       Reactions   Hydrocodone Shortness Of Breath   In combination with decongestants   Other Palpitations, Other (See Comments)   Reaction to all decongestants - palpitations, irregular heart rhythm   Oxycontin [oxycodone] Shortness Of Breath   Sudafed [pseudoephedrine] Palpitations   Ultram [tramadol] Shortness Of Breath, Rash, Other (See Comments)   Flushing    Lopid [gemfibrozil] Other (See Comments)   Dizziness        Medication List     TAKE these medications    acetaminophen 500 MG tablet Commonly known as: TYLENOL Take 1,000 mg by mouth every 6 (six) hours as needed for mild pain (pain score 1-3) or moderate pain (pain score 4-6).   apixaban 5 MG Tabs tablet Commonly known as: Eliquis Take 1 tablet (5 mg total) by mouth 2 (two) times daily.   cetirizine 10 MG tablet Commonly known as: ZYRTEC Take 10 mg by mouth daily.   Dorzolamide HCl-Timolol Mal PF 2-0.5 % Soln Place 1 drop into both eyes at bedtime.   empagliflozin 25 MG Tabs tablet Commonly known as: JARDIANCE Take 12.5 mg by mouth daily.   ferrous sulfate 325 (65 FE) MG tablet Take 325 mg by mouth every Monday, Wednesday, and Friday.   furosemide 20 MG tablet Commonly known as: LASIX Take 20 mg by mouth See admin instructions. Take 1 tablet (20mg ) twice a week on Monday and Thursday.   galantamine 24 MG 24 hr capsule Commonly known as: RAZADYNE ER Take 24 mg by mouth in the morning.   Hadlima 40 MG/0.4ML Sosy Generic drug: Adalimumab-bwwd Inject 40 mg into the skin every 14  (fourteen) days.   memantine 10 MG tablet Commonly known as: NAMENDA Take  20 mg by mouth at bedtime.   Multivitamin Gummies Mens Chew Chew 1 each by mouth daily.   nitroGLYCERIN 0.4 MG SL tablet Commonly known as: Nitrostat Place 1 tablet (0.4 mg total) under the tongue every 5 (five) minutes as needed.   omeprazole 20 MG tablet Commonly known as: PRILOSEC OTC Take 20 mg by mouth daily as needed (Acid reflux).   predniSONE 5 MG tablet Commonly known as: DELTASONE Take 1 tablet by mouth once daily with breakfast   ranolazine 500 MG 12 hr tablet Commonly known as: RANEXA Take 1 tablet (500 mg total) by mouth 2 (two) times daily.   rosuvastatin 40 MG tablet Commonly known as: CRESTOR Take 1 tablet (40 mg total) by mouth daily. What changed: when to take this   tamsulosin 0.4 MG Caps capsule Commonly known as: FLOMAX Take 1 capsule by mouth once daily What changed:  when to take this reasons to take this         Outstanding Labs/Studies   none ______________________  Duration of Discharge Encounter: APP Time: 20 minutes    Signed, Cline Crock, PA-C 06/11/2023, 10:28 AM [phone]  Patient seen, examined. Available data reviewed. Agree with findings, assessment, and plan as outlined by Carlean Jews, PA-C.  The patient is independently interviewed and examined.  He is alert, oriented, in no distress.  HEENT is normal, JVP is normal, lungs are clear bilaterally, heart is regular rate and rhythm with no murmur gallop, abdomen is soft and nontender with no palpable masses, extremities have no edema, bilateral groin sites are clear with dry dressings over both groin sites, no hematoma, and no tenderness.  The patient's echocardiogram is reviewed and shows normal LVEF, normal TAVR function with a mean gradient of 6 mmHg and no PVL.  Telemetry shows sinus rhythm without significant arrhythmia.  The patient will be discharged on apixaban which can be restarted today.   He has done very well with TAVR that hopefully he will have significant functional improvement.  We discussed post-TAVR restrictions and instructions.  He understands not to drive until he is seen in clinical follow-up next week.  He understands to ease back into activities and avoid any heavy lifting for the first week.  All of his questions were answered this morning.  He appears to be medically stable for hospital discharge. Creatinine 1.07 and Hgb 13.1, plt 167.  MD time spent examining patient, counseling, reviewing lab and echo images = 25 minutes  Tonny Bollman, M.D. 06/11/2023 11:36 AM

## 2023-06-10 NOTE — Discharge Instructions (Signed)

## 2023-06-11 ENCOUNTER — Inpatient Hospital Stay (HOSPITAL_COMMUNITY)

## 2023-06-11 DIAGNOSIS — Z952 Presence of prosthetic heart valve: Secondary | ICD-10-CM

## 2023-06-11 LAB — POCT I-STAT, CHEM 8
BUN: 17 mg/dL (ref 8–23)
Calcium, Ion: 1.22 mmol/L (ref 1.15–1.40)
Chloride: 103 mmol/L (ref 98–111)
Creatinine, Ser: 1.1 mg/dL (ref 0.61–1.24)
Glucose, Bld: 164 mg/dL — ABNORMAL HIGH (ref 70–99)
HCT: 36 % — ABNORMAL LOW (ref 39.0–52.0)
Hemoglobin: 12.2 g/dL — ABNORMAL LOW (ref 13.0–17.0)
Potassium: 4.3 mmol/L (ref 3.5–5.1)
Sodium: 138 mmol/L (ref 135–145)
TCO2: 26 mmol/L (ref 22–32)

## 2023-06-11 LAB — GLUCOSE, CAPILLARY: Glucose-Capillary: 76 mg/dL (ref 70–99)

## 2023-06-11 LAB — BASIC METABOLIC PANEL
Anion gap: 8 (ref 5–15)
BUN: 18 mg/dL (ref 8–23)
CO2: 28 mmol/L (ref 22–32)
Calcium: 9.2 mg/dL (ref 8.9–10.3)
Chloride: 104 mmol/L (ref 98–111)
Creatinine, Ser: 1.07 mg/dL (ref 0.61–1.24)
GFR, Estimated: >60 mL/min (ref >60)
Glucose, Bld: 93 mg/dL (ref 70–99)
Potassium: 3.5 mmol/L (ref 3.5–5.1)
Sodium: 140 mmol/L (ref 135–145)

## 2023-06-11 LAB — CBC
HCT: 38.6 % — ABNORMAL LOW (ref 39.0–52.0)
Hemoglobin: 13.1 g/dL (ref 13.0–17.0)
MCH: 29.4 pg (ref 26.0–34.0)
MCHC: 33.9 g/dL (ref 30.0–36.0)
MCV: 86.7 fL (ref 80.0–100.0)
Platelets: 167 10*3/uL (ref 150–400)
RBC: 4.45 MIL/uL (ref 4.22–5.81)
RDW: 13.3 % (ref 11.5–15.5)
WBC: 8.5 10*3/uL (ref 4.0–10.5)
nRBC: 0 % (ref 0.0–0.2)

## 2023-06-11 LAB — ECHOCARDIOGRAM COMPLETE
AR max vel: 2.58 cm2
AV Area VTI: 2.27 cm2
AV Area mean vel: 2.67 cm2
AV Mean grad: 6 mmHg
AV Peak grad: 10.8 mmHg
Ao pk vel: 1.64 m/s
Area-P 1/2: 1.91 cm2
Height: 75 in
S' Lateral: 2.9 cm
Weight: 3200 [oz_av]

## 2023-06-11 LAB — MAGNESIUM: Magnesium: 2 mg/dL (ref 1.7–2.4)

## 2023-06-11 NOTE — Progress Notes (Signed)
 CARDIAC REHAB PHASE I    Post TAVR education including site care, restrictions, risk factors, heart healthy diabetic diet, exercise guidelines and CRP2 reviewed. All questions and concerns addressed. Pt is not interested in CRP2. Plan or discharge home later today.   [id number] Woodroe Chen, RN BSN 06/11/2023 9:17 AM

## 2023-06-11 NOTE — Progress Notes (Signed)
 Mobility Specialist Progress Note:   06/11/23 1050  Mobility  Activity Ambulated with assistance in hallway  Level of Assistance Standby assist, set-up cues, supervision of patient - no hands on  Assistive Device Front wheel walker  Distance Ambulated (ft) 970 ft  Activity Response Tolerated well  Mobility Referral Yes  Mobility visit 1 Mobility  Mobility Specialist Start Time (ACUTE ONLY) 1035  Mobility Specialist Stop Time (ACUTE ONLY) 1050  Mobility Specialist Time Calculation (min) (ACUTE ONLY) 15 min   Pt received in bed, eager for mobility session. Ambulated in hallway with RW and SV. Tolerated well, asx throughout. Max HR 93 bpm. Returned pt to room, family at bedside, all needs met.   Feliciana Rossetti Mobility Specialist Please contact via Special educational needs teacher or  Rehab office at [phone]

## 2023-06-11 NOTE — Progress Notes (Signed)
  Echocardiogram 2D Echocardiogram has been performed.  Leda Roys RDCS 06/11/2023, 10:09 AM

## 2023-06-11 NOTE — Anesthesia Postprocedure Evaluation (Signed)
 Anesthesia Post Note  Patient: Kenneth [name]  Procedure(s) Performed: Transcatheter Aortic Valve Replacement, Transfemoral INTRAOPERATIVE TRANSTHORACIC ECHOCARDIOGRAM     Patient location during evaluation: PACU Anesthesia Type: MAC Level of consciousness: awake and alert Pain management: pain level controlled Vital Signs Assessment: post-procedure vital signs reviewed and stable Respiratory status: spontaneous breathing, nonlabored ventilation, respiratory function stable and patient connected to nasal cannula oxygen Cardiovascular status: stable and blood pressure returned to baseline Anesthetic complications: no   There were no known notable events for this encounter.  Last Vitals:  Vitals:   06/11/23 0556 06/11/23 0751  BP: 138/62 129/62  Pulse: 67 67  Resp: (!) 25 14  Temp: (!) 36.4 C 36.7 C  SpO2: 98% 100%    Last Pain:  Vitals:   06/11/23 0751  TempSrc: Oral  PainSc:                  [name]

## 2023-06-11 NOTE — Progress Notes (Signed)
 Patient is alert &oriented *4, Vital signs WNL, walked in the hallway without any issues.  Discharge instructions given to the patient and his wife, PIVs removed, CCMD notified, belongings taken by his family.

## 2023-06-12 ENCOUNTER — Telehealth: Payer: Self-pay | Admitting: Physician Assistant

## 2023-06-12 NOTE — Telephone Encounter (Signed)
  [facility]   MULTIDISCIPLINARY HEART VALVE TEAM   Patient contacted regarding discharge from [facility] on 06/11/23  Patient understands to follow up with a structural heart APP on 3/10 at [address] [facility].  Patient understands discharge instructions? yes Patient understands medications and regimen? yes Patient understands to bring all medications to this visit? Yes  Will restart Metformin on 3/7 PM (this had fallen off his med list and I added back)  Cline Crock PA-C  [facility]

## 2023-06-13 NOTE — Progress Notes (Signed)
 [facility]   MULTIDISCIPLINARY HEART VALVE CLINIC                                     Cardiology Office Note:    Date:  06/16/2023   ID:  Kenneth [name], DOB [birth year], MRN [id number]  PCP:  [facility]  [facility] Cardiologist:  Bryan Lemma, MD  / Dr. Excell Seltzer & Dr. Laneta Simmers (TAVR)  [facility] Electrophysiologist:  None   Referring MD: [facility], [facility]   [facility] s/p TAVR   History of Present Illness:    Kenneth [name] is a 78 y.o. male with a hx of paroxysmal atrial flutter s/p ablation 2006 & 2015, CAD s/p multiple PCIs, interstitial lung disease on chronic low dose prednisone, HTN, HLD, DMT2, memory issues, plaque psoriasis and severe AS s/p TAVR (06/04/23) who presents to clinic for follow up.  Patient had dyspnea on exertion in 2018 and underwent L/RHC that showed mild pulmonary hypertension, severe 95% mid RCA lesion treated with DES, 70% proximal to mid LAD lesion with FFR of 0.75 treated with staged PCI DES, 60% distal left circumflex lesion, 55% OM 3 lesion. Repeat L/RHC performed in 2022 showed a 70% mid to distal LAD lesion with FFR 0.75 treated with Synergy DES and 60% ostial D1 lesion treated with PTCA. Repeat L/RHC in 2023 showed significant in-stent restenosis at LAD-D2 bifurcation site treated with PTCA with kissing balloon technique. He has a history of ILD followed closely by Dr. Vassie Loll. He is maintained on low dose chronic prednisone with escalations during acute exacerbations. He was recently noted to have a worsening murmur by Dr. Vassie Loll and echo was ordered. Echo 05/14/2023 showed EF 60-65%, G2DD, mild MR, moderate TR, mild AI and severe aortic stenosis with a mean grad 42 mmHg, peak grad 56 mmHg, [name] 0.78 cm2, DVI 0.25, SVI 31. He underwent L/RHC on 05/27/2023 which showed 50% mid LAD in-stent restenosis, 50% stenosis in sidebranch in D2, 45% distal left circumflex lesion, 50% RPDA lesion, wedge pressure 10 mmHg, PA pressure 28/14 with mean 22  mmHg. Patient was referred to Dr. Excell Seltzer for structural heart consultation. Before being seen by the Structural Heart Team, he was admitted from 06/02/2023 to 06/03/2023 for persistent fatigue, atypical chest pain, and dyspnea. High-sensitivity troponin negative. BNP normal. He did not appear to volume overloaded and shortness of breath was not felt to be solely cardiac in nature. He was seen by Dr. Excell Seltzer inpatient and completed TAVR workup. Gated CTA showed tricuspid aortic valve with severely reduced cusp excursion, aortic valve calcium score of 3744, and an annular area of 574 mm, suitable for a 29 mm Edwards SAPIEN 3 valve. He was discharged with plans to see Dr. Laneta Simmers on 06/04/23. At his appointment, noted to be acute short of breath. Therefore, he was directly admitted to the hospital with plans to keep him inpatient until his TAVR. S/p TAVR with a 29 mm Edwards Sapien 3 Ultra Resilia  THV via the TF approach on 06/10/23. Post operative echo showed EF 60%, mod LVH, normally functioning TAVR with a mean gradient of 6 mmHg and no PVL as well as moderate MAC. He had marked symptomatic improvement post op and was discharged home the following day.   Today the patient presents to clinic for follow up. Here with wife and daughter. No CP or SOB. No LE edema, orthopnea or PND. No dizziness  or syncope. No blood in stool or urine. No palpitations. No fatigue with a great improvement in energy.   Past Medical History:  Diagnosis Date   Arthritis    CAD S/P percutaneous coronary angioplasty 06/28/2016   Cardiologist- Dr. Herbie [place]: 06/28/16 -- [facility] PCI (Synergy DES 2.75 x 20 mm); 07/10/16: Staged p-m LAD PCI (FFR 0.75) - DES PCI (Synergy DES 3.5 x 20 mm); 10/11/2020: mid-distal LAD 70% (FFR 0.75) DES PCI (Synergy DES 2.5 mm x 32 mm - overlaps prox-mid stent, tapered post-dilation 3.7 - 2.6 mm.;  06/2021 - Cath with severe LAD-D2 bifurcation ISR -- PTCA / kissing balloon - reduced LAD to ~25% & D2 to 40%   Chronic dryness  of both eyes    Chronic heart failure with preserved ejection fraction (HFpEF) (HCC) 05/22/2013   Diverticulosis of colon    Epiretinal membrane    "epiretinal attachment" (06/28/2016) 10-14-2017 per pt oringinally in both , now only unilateral    GERD (gastroesophageal reflux disease)    Hiatal hernia    History of adenomatous polyp of colon    History of concussion 08/23/2017   w/o loc--- per pt no residual   History of kidney stones    History of rheumatic fever 1958   NO Evidence of Vavlular disease -> only mild Aortic Sclerosis   History of skin cancer    excision leg;  froze the left arm--- unsure BCC or SCC    Hyperlipidemia    Hypertension    ILD (interstitial lung disease) (HCC)    pulmologist-  dr Vassie Loll--  secondary to methotrexate use --- last PFTs 04-25-2015  mild restriction   Iron deficiency anemia    Mild obstructive sleep apnea    10-04-2019 per pt  had a sleep study years ago , was told no cpap recommended pt denies    Nephrolithiasis    CT 09-07-2019 bilateral nonobstructive stones   Paroxysmal atrial fibrillation (HCC) 2015   a. 11/2003 Tikosyn initiated - subsequently d/c'd;  b. 2006 RFCA for Afib @ [facility];  c. 09/2011 Echo: EF 60-65%, Gr 2 DD;  d. DCCV 10/2011 , 04/2012, 12/ 2014, & 02/ 2015;  e. RFCA 01-13-2014   Plaque psoriasis 1969   followed by rheumatologist  and dermatologist @ [facility] in [place]   PONV (postoperative nausea and vomiting)    Right ureteral stone    S/P drug eluting coronary stent placement    06-28-2016  x1 to mRCA;  07-10-2016 x1 to mLAD   S/P TAVR (transcatheter aortic valve replacement) 06/10/2023   s/p TAVR with a 29 mm Edwards S3UR via TF approach by Drs Excell Seltzer and Laneta Simmers   Scarring of lung    Seasonal allergies    Short-term memory loss    Type 2 diabetes mellitus (HCC)    followed by pcp   Ureteral calculus, right    Wears hearing aid in both ears      Current Medications: Current Meds  Medication Sig   acetaminophen  (TYLENOL) 500 MG tablet Take 1,000 mg by mouth every 6 (six) hours as needed for mild pain (pain score 1-3) or moderate pain (pain score 4-6).   Adalimumab-bwwd (HADLIMA) 40 MG/0.4ML SOSY Inject 40 mg into the skin every 14 (fourteen) days.   amoxicillin (AMOXIL) 500 MG tablet Take 4 tablets (2,000 mg total) by mouth as directed. 1 hour prior to dental work including cleanings   apixaban (ELIQUIS) 5 MG TABS tablet Take 1 tablet (5 mg total) by mouth 2 (two)  times daily.   cetirizine (ZYRTEC) 10 MG tablet Take 10 mg by mouth daily.   Dorzolamide HCl-Timolol Mal PF 2-0.5 % SOLN Place 1 drop into both eyes at bedtime.   empagliflozin (JARDIANCE) 25 MG TABS tablet Take 12.5 mg by mouth daily.   ferrous sulfate 325 (65 FE) MG tablet Take 325 mg by mouth every Monday, Wednesday, and Friday.   furosemide (LASIX) 20 MG tablet Take 20 mg by mouth See admin instructions. Take 1 tablet (20mg ) twice a week on Monday and Thursday.   galantamine (RAZADYNE ER) 24 MG 24 hr capsule Take 24 mg by mouth in the morning.   memantine (NAMENDA) 10 MG tablet Take 20 mg by mouth at bedtime.   metFORMIN (GLUCOPHAGE) 500 MG tablet Take 500 mg by mouth at bedtime.   Multiple Vitamins-Minerals (MULTIVITAMIN GUMMIES MENS) CHEW Chew 1 each by mouth daily.   nitroGLYCERIN (NITROSTAT) 0.4 MG SL tablet Place 1 tablet (0.4 mg total) under the tongue every 5 (five) minutes as needed.   omeprazole (PRILOSEC OTC) 20 MG tablet Take 20 mg by mouth daily as needed (Acid reflux).   predniSONE (DELTASONE) 5 MG tablet Take 1 tablet by mouth once daily with breakfast   ranolazine (RANEXA) 500 MG 12 hr tablet Take 1 tablet (500 mg total) by mouth 2 (two) times daily.   rosuvastatin (CRESTOR) 40 MG tablet Take 1 tablet (40 mg total) by mouth daily. (Patient taking differently: Take 40 mg by mouth at bedtime.)   tamsulosin (FLOMAX) 0.4 MG CAPS capsule Take 1 capsule by mouth once daily (Patient taking differently: Take 0.4 mg by mouth daily as  needed (kidney stones).)      ROS:   Please see the history of present illness.    All other systems reviewed and are negative.  EKGs   EKG Interpretation Date/Time:  Monday June 16 2023 08:41:36 EDT Ventricular Rate:  74 PR Interval:  216 QRS Duration:  86 QT Interval:  394 QTC Calculation: 437 R Axis:   46  Text Interpretation: Sinus rhythm with 1st degree A-V block Confirmed by Cline Crock [phone]) on 06/16/2023 8:49:40 AM   Risk Assessment/Calculations:    CHA2DS2-VASc Score = 5   This indicates a 7.2% annual risk of stroke. The patient's score is based upon: CHF History: 1 HTN History: 0 Diabetes History: 1 Stroke History: 0 Vascular Disease History: 1 Age Score: 2 Gender Score: 0          Physical Exam:    VS:  BP 132/68   Pulse 71   Ht 6\' 3"  (1.905 m)   Wt 202 lb 3.2 oz (91.7 kg)   SpO2 98%   BMI 25.27 kg/m     Wt Readings from Last 3 Encounters:  06/16/23 202 lb 3.2 oz (91.7 kg)  06/11/23 200 lb (90.7 kg)  06/04/23 204 lb (92.5 kg)     GEN: Well nourished, well developed in no acute distress NECK: No JVD CARDIAC: RRR, no murmurs, rubs, gallops RESPIRATORY:  Clear to auscultation without rales, wheezing or rhonchi  ABDOMEN: Soft, non-tender, non-distended EXTREMITIES:  No edema; No deformity.  Groin sites clear without hematoma or ecchymosis.   ASSESSMENT:    1. S/P TAVR (transcatheter aortic valve replacement)   2. Chronic heart failure with preserved ejection fraction (HFpEF) (HCC)   3. Coronary artery disease involving native coronary artery of native heart with angina pectoris (HCC)   4. Paroxysmal atrial fibrillation (HCC)   5. Thoracic aortic aneurysm without rupture,  unspecified part (HCC)   6. Essential hypertension   7. Hyperlipidemia associated with type 2 diabetes mellitus (HCC)   8. ILD (interstitial lung disease) (HCC)     PLAN:    In order of problems listed above:  Severe AS s/p TAVR:  -- Pt doing excellent s/p  TAVR.  -- ECG with no HAVB.  -- Groin sites healing well.  -- SBE prophylaxis discussed; I have RX'd amoxicillin. .  -- Continue Eliquis 5mg  BID. -- Cleared to resume all activities without restriction. -- I will see back for 1 month echo and OV.  Chronic HFpEF -- Appears euvolemic.  -- Continue Jardiance 12.5mg  daily -- Continue on Lasix 2x a week (m,th).   CAD -- History of multiple interventions in the past including prior stenting to RCA and LAD. Last intervention was PTCA with kissing balloon technique of in-stent restenosis of LAD-D2 bifurcation site in 06/2021.  -- R/LHC on 05/27/2023 showed widely patent stents to LAD and RCA with 50% in-stent restenosis of mid LAD at previous PTCA site. -- Continue Ranexa 500mg  twice daily. -- No aspirin due to need for Eliquis. -- Continue Crestor 40mg  daily. -- Discussed that is safe for him to take SL NTG if needed.    Paroxysmal Atrial Fibrillation/ Flutter -- S/p atrial flutter ablation in 2006 and atrial fibrillation ablation in 01/2014. -- Maintaining sinus rhythm. -- Not on any AV nodal agents. -- Continue Eliquis 5mg  BID.   TAA: -- 4.0cm. Noted on pre TAVR scans -- This will be followed over time.    Hypertension -- BP well controlled without any antihypertensives.   Hyperlipidemia -- Continue home Crestor 40mg  daily.   Interstitial Lung Disease -- Continue Prednisone 5mg  daily.   Medication Adjustments/Labs and Tests Ordered: Current medicines are reviewed at length with the patient today.  Concerns regarding medicines are outlined above.  Orders Placed This Encounter  Procedures   EKG 12-Lead   Meds ordered this encounter  Medications   amoxicillin (AMOXIL) 500 MG tablet    Sig: Take 4 tablets (2,000 mg total) by mouth as directed. 1 hour prior to dental work including cleanings    Dispense:  12 tablet    Refill:  12    Supervising Provider:   Tonny Bollman [[id number]]    Patient Instructions  Medication  Instructions:  Your physician recommends that you continue on your current medications as directed. Please refer to the Current Medication list given to you today.  *If you need a refill on your cardiac medications before your next appointment, please call your pharmacy*   Lab Work: None ordered  If you have labs (blood work) drawn today and your tests are completely normal, you will receive your results only by: MyChart Message (if you have MyChart) OR A paper copy in the mail If you have any lab test that is abnormal or we need to change your treatment, we will call you to review the results.   Testing/Procedures: None ordered   Follow-Up: At [facility], you and your health needs are our priority.  As part of our continuing mission to provide you with exceptional heart care, we have created designated Provider Care Teams.  These Care Teams include your primary Cardiologist (physician) and Advanced Practice Providers (APPs -  Physician Assistants and Nurse Practitioners) who all work together to provide you with the care you need, when you need it.  We recommend signing up for the patient portal called "MyChart".  Sign up information is  provided on this After Visit Summary.  MyChart is used to connect with patients for Virtual Visits (Telemedicine).  Patients are able to view lab/test results, encounter notes, upcoming appointments, etc.  Non-urgent messages can be sent to your provider as well.   To learn more about what you can do with MyChart, go to [email or web address].    Your next appointment:   As scheduled  Provider:   Carlean Jews, PA-C    Other Instructions      Signed, Cline Crock, PA-C  06/16/2023 9:08 AM    [facility]

## 2023-06-16 ENCOUNTER — Ambulatory Visit: Attending: Physician Assistant | Admitting: Physician Assistant

## 2023-06-16 ENCOUNTER — Ambulatory Visit: Payer: Medicare Other | Admitting: Cardiovascular Disease

## 2023-06-16 VITALS — BP 132/68 | HR 71 | Ht 75.0 in | Wt 202.2 lb

## 2023-06-16 DIAGNOSIS — I25119 Atherosclerotic heart disease of native coronary artery with unspecified angina pectoris: Secondary | ICD-10-CM | POA: Diagnosis not present

## 2023-06-16 DIAGNOSIS — I10 Essential (primary) hypertension: Secondary | ICD-10-CM | POA: Diagnosis not present

## 2023-06-16 DIAGNOSIS — J849 Interstitial pulmonary disease, unspecified: Secondary | ICD-10-CM | POA: Insufficient documentation

## 2023-06-16 DIAGNOSIS — I5032 Chronic diastolic (congestive) heart failure: Secondary | ICD-10-CM | POA: Insufficient documentation

## 2023-06-16 DIAGNOSIS — I712 Thoracic aortic aneurysm, without rupture, unspecified: Secondary | ICD-10-CM | POA: Diagnosis not present

## 2023-06-16 DIAGNOSIS — E1169 Type 2 diabetes mellitus with other specified complication: Secondary | ICD-10-CM | POA: Diagnosis not present

## 2023-06-16 DIAGNOSIS — E785 Hyperlipidemia, unspecified: Secondary | ICD-10-CM | POA: Diagnosis not present

## 2023-06-16 DIAGNOSIS — I480 Paroxysmal atrial fibrillation: Secondary | ICD-10-CM | POA: Diagnosis not present

## 2023-06-16 DIAGNOSIS — Z952 Presence of prosthetic heart valve: Secondary | ICD-10-CM | POA: Diagnosis not present

## 2023-06-16 MED ORDER — AMOXICILLIN 500 MG PO TABS: 2000.0000 mg | ORAL_TABLET | ORAL | 12 refills | Status: DC

## 2023-06-16 NOTE — Patient Instructions (Signed)
 Medication Instructions:  Your physician recommends that you continue on your current medications as directed. Please refer to the Current Medication list given to you today.  *If you need a refill on your cardiac medications before your next appointment, please call your pharmacy*   Lab Work: None ordered  If you have labs (blood work) drawn today and your tests are completely normal, you will receive your results only by: MyChart Message (if you have MyChart) OR A paper copy in the mail If you have any lab test that is abnormal or we need to change your treatment, we will call you to review the results.   Testing/Procedures: None ordered   Follow-Up: At [facility], you and your health needs are our priority.  As part of our continuing mission to provide you with exceptional heart care, we have created designated Provider Care Teams.  These Care Teams include your primary Cardiologist (physician) and Advanced Practice Providers (APPs -  Physician Assistants and Nurse Practitioners) who all work together to provide you with the care you need, when you need it.  We recommend signing up for the patient portal called "MyChart".  Sign up information is provided on this After Visit Summary.  MyChart is used to connect with patients for Virtual Visits (Telemedicine).  Patients are able to view lab/test results, encounter notes, upcoming appointments, etc.  Non-urgent messages can be sent to your provider as well.   To learn more about what you can do with MyChart, go to [email or web address].    Your next appointment:   As scheduled  Provider:   Carlean Jews, PA-C    Other Instructions

## 2023-06-21 ENCOUNTER — Emergency Department (HOSPITAL_COMMUNITY)

## 2023-06-21 ENCOUNTER — Other Ambulatory Visit: Payer: Self-pay

## 2023-06-21 ENCOUNTER — Observation Stay (HOSPITAL_COMMUNITY)
Admission: EM | Admit: 2023-06-21 | Discharge: 2023-06-22 | Disposition: A | Discharge status: Home / Self Care | Attending: Emergency Medicine | Admitting: Emergency Medicine

## 2023-06-21 ENCOUNTER — Encounter (HOSPITAL_COMMUNITY): Payer: Self-pay | Admitting: *Deleted

## 2023-06-21 DIAGNOSIS — Z9861 Coronary angioplasty status: Secondary | ICD-10-CM | POA: Diagnosis not present

## 2023-06-21 DIAGNOSIS — I480 Paroxysmal atrial fibrillation: Secondary | ICD-10-CM | POA: Diagnosis not present

## 2023-06-21 DIAGNOSIS — R0682 Tachypnea, not elsewhere classified: Secondary | ICD-10-CM | POA: Diagnosis not present

## 2023-06-21 DIAGNOSIS — E785 Hyperlipidemia, unspecified: Secondary | ICD-10-CM | POA: Diagnosis not present

## 2023-06-21 DIAGNOSIS — I5032 Chronic diastolic (congestive) heart failure: Secondary | ICD-10-CM | POA: Insufficient documentation

## 2023-06-21 DIAGNOSIS — Z85828 Personal history of other malignant neoplasm of skin: Secondary | ICD-10-CM | POA: Diagnosis not present

## 2023-06-21 DIAGNOSIS — I517 Cardiomegaly: Secondary | ICD-10-CM | POA: Diagnosis not present

## 2023-06-21 DIAGNOSIS — Z7901 Long term (current) use of anticoagulants: Secondary | ICD-10-CM | POA: Diagnosis not present

## 2023-06-21 DIAGNOSIS — Z1152 Encounter for screening for COVID-19: Secondary | ICD-10-CM | POA: Insufficient documentation

## 2023-06-21 DIAGNOSIS — R413 Other amnesia: Secondary | ICD-10-CM | POA: Insufficient documentation

## 2023-06-21 DIAGNOSIS — R918 Other nonspecific abnormal finding of lung field: Secondary | ICD-10-CM | POA: Diagnosis not present

## 2023-06-21 DIAGNOSIS — Z79899 Other long term (current) drug therapy: Secondary | ICD-10-CM | POA: Diagnosis not present

## 2023-06-21 DIAGNOSIS — Z87891 Personal history of nicotine dependence: Secondary | ICD-10-CM | POA: Diagnosis not present

## 2023-06-21 DIAGNOSIS — I110 Hypertensive heart disease with heart failure: Secondary | ICD-10-CM | POA: Diagnosis not present

## 2023-06-21 DIAGNOSIS — Z952 Presence of prosthetic heart valve: Secondary | ICD-10-CM | POA: Insufficient documentation

## 2023-06-21 DIAGNOSIS — R0600 Dyspnea, unspecified: Secondary | ICD-10-CM | POA: Diagnosis not present

## 2023-06-21 DIAGNOSIS — I2510 Atherosclerotic heart disease of native coronary artery without angina pectoris: Secondary | ICD-10-CM | POA: Insufficient documentation

## 2023-06-21 DIAGNOSIS — I700 Atherosclerosis of aorta: Secondary | ICD-10-CM | POA: Diagnosis not present

## 2023-06-21 DIAGNOSIS — R0602 Shortness of breath: Secondary | ICD-10-CM

## 2023-06-21 DIAGNOSIS — I5030 Unspecified diastolic (congestive) heart failure: Secondary | ICD-10-CM | POA: Insufficient documentation

## 2023-06-21 DIAGNOSIS — E1169 Type 2 diabetes mellitus with other specified complication: Secondary | ICD-10-CM | POA: Diagnosis present

## 2023-06-21 DIAGNOSIS — J849 Interstitial pulmonary disease, unspecified: Secondary | ICD-10-CM | POA: Diagnosis present

## 2023-06-21 DIAGNOSIS — E1159 Type 2 diabetes mellitus with other circulatory complications: Secondary | ICD-10-CM | POA: Diagnosis present

## 2023-06-21 DIAGNOSIS — E119 Type 2 diabetes mellitus without complications: Secondary | ICD-10-CM | POA: Diagnosis not present

## 2023-06-21 DIAGNOSIS — I152 Hypertension secondary to endocrine disorders: Secondary | ICD-10-CM | POA: Diagnosis present

## 2023-06-21 DIAGNOSIS — Z7984 Long term (current) use of oral hypoglycemic drugs: Secondary | ICD-10-CM | POA: Insufficient documentation

## 2023-06-21 LAB — COMPREHENSIVE METABOLIC PANEL
ALT: 28 U/L (ref 0–44)
AST: 34 U/L (ref 15–41)
Albumin: 4.2 g/dL (ref 3.5–5.0)
Alkaline Phosphatase: 76 U/L (ref 38–126)
Anion gap: 17 — ABNORMAL HIGH (ref 5–15)
BUN: 14 mg/dL (ref 8–23)
CO2: 21 mmol/L — ABNORMAL LOW (ref 22–32)
Calcium: 10.1 mg/dL (ref 8.9–10.3)
Chloride: 102 mmol/L (ref 98–111)
Creatinine, Ser: 1.29 mg/dL — ABNORMAL HIGH (ref 0.61–1.24)
GFR, Estimated: 57 mL/min — ABNORMAL LOW (ref >60)
Glucose, Bld: 175 mg/dL — ABNORMAL HIGH (ref 70–99)
Potassium: 4.5 mmol/L (ref 3.5–5.1)
Sodium: 140 mmol/L (ref 135–145)
Total Bilirubin: 0.8 mg/dL (ref 0.0–1.2)
Total Protein: 7.7 g/dL (ref 6.5–8.1)

## 2023-06-21 LAB — CBC WITH DIFFERENTIAL/PLATELET
Abs Immature Granulocytes: 0.06 10*3/uL (ref 0.00–0.07)
Basophils Absolute: 0.1 10*3/uL (ref 0.0–0.1)
Basophils Relative: 0 %
Eosinophils Absolute: 0.1 10*3/uL (ref 0.0–0.5)
Eosinophils Relative: 0 %
HCT: 39.5 % (ref 39.0–52.0)
Hemoglobin: 13.2 g/dL (ref 13.0–17.0)
Immature Granulocytes: 1 %
Lymphocytes Relative: 17 %
Lymphs Abs: 2 10*3/uL (ref 0.7–4.0)
MCH: 29.4 pg (ref 26.0–34.0)
MCHC: 33.4 g/dL (ref 30.0–36.0)
MCV: 88 fL (ref 80.0–100.0)
Monocytes Absolute: 0.6 10*3/uL (ref 0.1–1.0)
Monocytes Relative: 5 %
Neutro Abs: 9 10*3/uL — ABNORMAL HIGH (ref 1.7–7.7)
Neutrophils Relative %: 77 %
Platelets: 186 10*3/uL (ref 150–400)
RBC: 4.49 MIL/uL (ref 4.22–5.81)
RDW: 13.3 % (ref 11.5–15.5)
WBC: 11.7 10*3/uL — ABNORMAL HIGH (ref 4.0–10.5)
nRBC: 0 % (ref 0.0–0.2)

## 2023-06-21 LAB — GLUCOSE, CAPILLARY: Glucose-Capillary: 151 mg/dL — ABNORMAL HIGH (ref 70–99)

## 2023-06-21 LAB — RESP PANEL BY RT-PCR (RSV, FLU A&B, COVID)  RVPGX2
Influenza A by PCR: NEGATIVE
Influenza B by PCR: NEGATIVE
Resp Syncytial Virus by PCR: NEGATIVE
SARS Coronavirus 2 by RT PCR: NEGATIVE

## 2023-06-21 LAB — TROPONIN I (HIGH SENSITIVITY)
Troponin I (High Sensitivity): 8 ng/L (ref <18)
Troponin I (High Sensitivity): 6 ng/L (ref <18)

## 2023-06-21 LAB — BRAIN NATRIURETIC PEPTIDE: B Natriuretic Peptide: 59.1 pg/mL (ref 0.0–100.0)

## 2023-06-21 MED ORDER — ONDANSETRON HCL 4 MG PO TABS: 4.0000 mg | ORAL_TABLET | Freq: Four times a day (QID) | ORAL | Status: DC | PRN

## 2023-06-21 MED ORDER — INSULIN ASPART 100 UNIT/ML IJ SOLN: 0.0000 [IU] | Freq: Every day | INTRAMUSCULAR | Status: DC

## 2023-06-21 MED ORDER — ONDANSETRON HCL 4 MG/2ML IJ SOLN
4.0000 mg | Freq: Four times a day (QID) | INTRAMUSCULAR | Status: DC | PRN
  Administered 2023-06-21: 4 mg via INTRAVENOUS
  Filled 2023-06-21: qty 2

## 2023-06-21 MED ORDER — HYDROXYZINE HCL 10 MG/5ML PO SYRP: 10.0000 mg | ORAL_SOLUTION | Freq: Three times a day (TID) | ORAL | Status: DC | PRN

## 2023-06-21 MED ORDER — RANOLAZINE ER 500 MG PO TB12
500.0000 mg | ORAL_TABLET | Freq: Two times a day (BID) | ORAL | Status: DC
  Administered 2023-06-21 – 2023-06-22 (×2): 500 mg via ORAL
  Filled 2023-06-21 (×2): qty 1

## 2023-06-21 MED ORDER — ACETAMINOPHEN 650 MG RE SUPP: 650.0000 mg | Freq: Four times a day (QID) | RECTAL | Status: DC | PRN

## 2023-06-21 MED ORDER — SODIUM CHLORIDE 0.9% FLUSH
3.0000 mL | Freq: Two times a day (BID) | INTRAVENOUS | Status: DC
  Administered 2023-06-21 – 2023-06-22 (×2): 3 mL via INTRAVENOUS

## 2023-06-21 MED ORDER — MEMANTINE HCL 10 MG PO TABS
20.0000 mg | ORAL_TABLET | Freq: Every day | ORAL | Status: DC
  Administered 2023-06-21: 20 mg via ORAL
  Filled 2023-06-21: qty 2

## 2023-06-21 MED ORDER — PREDNISONE 5 MG PO TABS
5.0000 mg | ORAL_TABLET | Freq: Every day | ORAL | Status: DC
  Administered 2023-06-22: 5 mg via ORAL
  Filled 2023-06-21: qty 1

## 2023-06-21 MED ORDER — APIXABAN 5 MG PO TABS
5.0000 mg | ORAL_TABLET | Freq: Two times a day (BID) | ORAL | Status: DC
  Administered 2023-06-21 – 2023-06-22 (×2): 5 mg via ORAL
  Filled 2023-06-21 (×2): qty 1

## 2023-06-21 MED ORDER — ALUM & MAG HYDROXIDE-SIMETH 200-200-20 MG/5ML PO SUSP: 15.0000 mL | Freq: Once | ORAL | Status: DC | PRN

## 2023-06-21 MED ORDER — HYDROXYZINE HCL 10 MG PO TABS
10.0000 mg | ORAL_TABLET | Freq: Three times a day (TID) | ORAL | Status: DC | PRN
  Administered 2023-06-21: 10 mg via ORAL
  Filled 2023-06-21: qty 1

## 2023-06-21 MED ORDER — FUROSEMIDE 10 MG/ML IJ SOLN
40.0000 mg | Freq: Once | INTRAMUSCULAR | Status: AC
  Administered 2023-06-21: 40 mg via INTRAVENOUS
  Filled 2023-06-21: qty 4

## 2023-06-21 MED ORDER — SENNOSIDES-DOCUSATE SODIUM 8.6-50 MG PO TABS: 1.0000 | ORAL_TABLET | Freq: Every evening | ORAL | Status: DC | PRN

## 2023-06-21 MED ORDER — GALANTAMINE HYDROBROMIDE ER 8 MG PO CP24
24.0000 mg | ORAL_CAPSULE | Freq: Every morning | ORAL | Status: DC
  Administered 2023-06-22: 24 mg via ORAL
  Filled 2023-06-21: qty 3

## 2023-06-21 MED ORDER — INSULIN ASPART 100 UNIT/ML IJ SOLN
0.0000 [IU] | Freq: Three times a day (TID) | INTRAMUSCULAR | Status: DC
  Administered 2023-06-22: 2 [IU] via SUBCUTANEOUS
  Administered 2023-06-22: 1 [IU] via SUBCUTANEOUS

## 2023-06-21 MED ORDER — ACETAMINOPHEN 325 MG PO TABS
650.0000 mg | ORAL_TABLET | Freq: Four times a day (QID) | ORAL | Status: DC | PRN
  Administered 2023-06-21: 650 mg via ORAL
  Filled 2023-06-21: qty 2

## 2023-06-21 MED ORDER — ORAL CARE MOUTH RINSE: 15.0000 mL | OROMUCOSAL | Status: DC | PRN

## 2023-06-21 MED ORDER — ENSURE ENLIVE PO LIQD
237.0000 mL | Freq: Two times a day (BID) | ORAL | Status: DC
  Administered 2023-06-22: 237 mL via ORAL

## 2023-06-21 MED ORDER — ROSUVASTATIN CALCIUM 20 MG PO TABS
40.0000 mg | ORAL_TABLET | Freq: Every day | ORAL | Status: DC
Start: 2023-06-21 — End: 2023-06-22
  Administered 2023-06-21: 40 mg via ORAL
  Filled 2023-06-21: qty 2

## 2023-06-21 NOTE — ED Notes (Signed)
Cardiology at bedisde 

## 2023-06-21 NOTE — H&P (Signed)
 History and Physical    Kenneth [name]:[id number] DOB: Jun 04, 1945 DOA: 06/21/2023  PCP: [facility]  Patient coming from: Home  I have personally briefly reviewed patient's old medical records in [facility]  Chief Complaint: Dyspnea  HPI: Kenneth [name] is a 78 y.o. male with medical history significant for CAD s/p PCI, PAF on Eliquis, HFpEF (60-65%), severe AS s/p TAVR (06/10/2023), ILD on chronic prednisone 5 mg daily, T2DM, HTN, HLD, psoriatic arthritis on adalimumab who presented to the ED for evaluation of dyspnea.  Patient underwent TAVR on 06/10/2023.  He did well postoperatively.  He was seen in cardiology clinic on 3/10 for follow-up.  At that time he continued to feel well without any recurrent dyspnea, orthopnea, peripheral edema, or fatigue.  Patient states that over the last few days he has been having increased shortness of breath with and without activity.  He has felt himself breathing rapidly today and has had to take deep long inspirations to try to slow his respiratory rate.  He has had a very infrequent nonproductive cough.  He reports intermittent sharp stabbing nonradiating left lower chest pain that will resolve on its own after a few minutes.  He has not noted any new swelling in his extremities.  He does report some indigestion without nausea or vomiting.  Denies abdominal pain, dysuria.  ED Course  Labs/Imaging on admission: I have personally reviewed following labs and imaging studies.  Initial vitals showed BP 137/76, pulse 81, RR 22, temp 97.5 F, SpO2 100% on room air.  Patient placed on supplemental oxygen via  for comfort.  Labs showed sodium 140, potassium 4.5, bicarb 21, BUN 14, creatinine 1.29, serum glucose 175, LFTs within normal limits, WBC 11.7, hemoglobin 13.2, platelets 186,000, BNP 59.1, troponin 8.  SARS-CoV-2, influenza, RSV PCR negative.  Portable chest x-ray showed mild diffuse interstitial opacities  bilaterally.  Cardiology were consulted and have evaluated patient.  Bedside ultrasound performed, poor acoustic windows noted but no pericardial effusion, LV dysfunction, or new acute valve abnormality appreciated.  No recommended medical admission and obtaining formal echocardiogram in a.m.  The hospitalist service was consulted to admit.  Review of Systems: All systems reviewed and are negative except as documented in history of present illness above.   Past Medical History:  Diagnosis Date   Arthritis    CAD S/P percutaneous coronary angioplasty 06/28/2016   Cardiologist- Dr. Herbie [place]: 06/28/16 -- [facility] PCI (Synergy DES 2.75 x 20 mm); 07/10/16: Staged p-m LAD PCI (FFR 0.75) - DES PCI (Synergy DES 3.5 x 20 mm); 10/11/2020: mid-distal LAD 70% (FFR 0.75) DES PCI (Synergy DES 2.5 mm x 32 mm - overlaps prox-mid stent, tapered post-dilation 3.7 - 2.6 mm.;  06/2021 - Cath with severe LAD-D2 bifurcation ISR -- PTCA / kissing balloon - reduced LAD to ~25% & D2 to 40%   Chronic dryness of both eyes    Chronic heart failure with preserved ejection fraction (HFpEF) (HCC) 05/22/2013   Diverticulosis of colon    Epiretinal membrane    "epiretinal attachment" (06/28/2016) 10-14-2017 per pt oringinally in both , now only unilateral    GERD (gastroesophageal reflux disease)    Hiatal hernia    History of adenomatous polyp of colon    History of concussion 08/23/2017   w/o loc--- per pt no residual   History of kidney stones    History of rheumatic fever 1958   NO Evidence of Vavlular disease -> only mild Aortic Sclerosis   History  of skin cancer    excision leg;  froze the left arm--- unsure BCC or SCC    Hyperlipidemia    Hypertension    ILD (interstitial lung disease) (HCC)    pulmologist-  dr Vassie Loll--  secondary to methotrexate use --- last PFTs 04-25-2015  mild restriction   Iron deficiency anemia    Mild obstructive sleep apnea    10-04-2019 per pt  had a sleep study years ago , was told no cpap  recommended pt denies    Nephrolithiasis    CT 09-07-2019 bilateral nonobstructive stones   Paroxysmal atrial fibrillation (HCC) 2015   a. 11/2003 Tikosyn initiated - subsequently d/c'd;  b. 2006 RFCA for Afib @ [facility];  c. 09/2011 Echo: EF 60-65%, Gr 2 DD;  d. DCCV 10/2011 , 04/2012, 12/ 2014, & 02/ 2015;  e. RFCA 01-13-2014   Plaque psoriasis 1969   followed by rheumatologist  and [facility] @ [facility] in [place]   PONV (postoperative nausea and vomiting)    Right ureteral stone    S/P drug eluting coronary stent placement    06-28-2016  x1 to mRCA;  07-10-2016 x1 to mLAD   S/P TAVR (transcatheter aortic valve replacement) 06/10/2023   s/p TAVR with a 29 mm Edwards S3UR via TF approach by Drs Excell Seltzer and Laneta Simmers   Scarring of lung    Seasonal allergies    Short-term memory loss    Type 2 diabetes mellitus (HCC)    followed by pcp   Ureteral calculus, right    Wears hearing aid in both ears     Past Surgical History:  Procedure Laterality Date   APPENDECTOMY  1996   ATRIAL FIBRILLATION ABLATION N/A 01/13/2014   repeat PVI, also left atrial ablation performed with successfull ablation of LA flutter by Dr Johney Frame   ATRIAL FIBRILLATION ABLATION  08/2004   Dr Delena Serve at [facility] [place])   CARDIAC STRESS PET  03/12/2022   Normal perfusion study/low risk.  Normal LVEF and normal LVEF preserved.  Suspect reduced myocardial blood flow reserve related to microvascular disease.  No evidence of infarction or ischemia.  Resting EF 48%.  Stress EF 54%.  Mildly enlarged LV. Asc Ao 4.1 cm--stable.  Also stable mosaic attenuation in the lungs no change.   CARDIOVERSION N/A 05/22/2013   Procedure: CARDIOVERSION;  Surgeon: Hillis Range, MD;  Location: [facility] OR;  Service: Cardiovascular;  Laterality: N/A;   CARDIOVERSION N/A 10/23/2011   Procedure: CARDIOVERSION;  Surgeon: Duke Salvia, MD;  Location: [facility] CATH LAB;  Service: Cardiovascular;  Laterality: N/A;   CARDIOVERSION N/A 05/01/2012    Procedure: CARDIOVERSION;  Surgeon: Duke Salvia, MD;  Location: [facility] CATH LAB;  Service: Cardiovascular;  Laterality: N/A;   CARPAL TUNNEL RELEASE Right 1980s   CATARACT EXTRACTION W/ INTRAOCULAR LENS  IMPLANT, BILATERAL  2012  approx.   CORONARY BALLOON ANGIOPLASTY N/A 07/04/2021   Procedure: CORONARY BALLOON ANGIOPLASTY;  Surgeon: Marykay Lex, MD;  Location: [facility] INVASIVE CV LAB;  Service: Cardiovascular;; scoring balloon and an [place] balloon PTCA of LAD; with kissing balloon PTCA of LAD-D2 bifurcation stented segment reducing LAD to 20 % ISR and ostial D2 to 40%.   CORONARY PRESSURE/FFR STUDY N/A 06/28/2016   Procedure: Intravascular Pressure Wire/FFR Study;  Surgeon: Marykay Lex, MD;  Location: [facility] INVASIVE CV LAB;  Service: Cardiovascular: FFR of mLAD ~65% lesion = 0.75 post --> Staged PCI   CORONARY PRESSURE/FFR STUDY N/A 10/11/2020   Procedure: INTRAVASCULAR PRESSURE WIRE/FFR STUDY;  Surgeon: Marykay Lex, MD;  Location: [facility] INVASIVE CV LAB;  Service: Cardiovascular;  Laterality: LAD mid-distal just after D2 --> FFR 0.75 SIGNIFICANT => PCI   CORONARY STENT INTERVENTION N/A 06/28/2016   Procedure: Coronary Stent Intervention;  Surgeon: Marykay Lex, MD;  Location: [facility] INVASIVE CV LAB;  Service: Cardiovascular;  Laterality: mRCA PCI -Synergy DES 2.75 m x 20 mm (3.1 mm)   CORONARY STENT INTERVENTION N/A 07/10/2016   Procedure: Coronary Stent Intervention;  Surgeon: Marykay Lex, MD;  Location: [facility] INVASIVE CV LAB;  Service: Cardiovascular: mLAD PCI Synergy DES 3.5 mm x 20 mm)   CORONARY STENT INTERVENTION N/A 10/11/2020   Procedure: CORONARY STENT INTERVENTION;  Surgeon: Marykay Lex, MD;  Location: [facility] INVASIVE CV LAB;  Service: Cardiovascular: mid-distal LAD ~70% (FFR 0.75): SYNERGY DES 2.5 MM X 32 MM (overlaps prox-mid Stent from 07/2016) - Tapered post-dilation 3.7 mm ->2.6 mm; Jailed D2 PTCA reduced 60% to 40%.   CYSTOSCOPY WITH RETROGRADE PYELOGRAM, URETEROSCOPY AND STENT  PLACEMENT Right 10/16/2017   Procedure: CYSTOSCOPY WITH RIGHT RETROGRADE RIGHT URETEROSCOPY AND STONE BASKET EXTRACTION;  Surgeon: Bjorn Pippin, MD;  Location: [facility];  Service: Urology;  Laterality: Right;   CYSTOSCOPY WITH RETROGRADE PYELOGRAM, URETEROSCOPY AND STENT PLACEMENT Right 10/08/2019   Procedure: CYSTOSCOPY WITH URETEROSCOPY; RETROGRADE PYELOGRAM; STONE BASKET EXTRACTION;  STENT PLACEMENT;  Surgeon: Malen Gauze, MD;  Location: [facility];  Service: Urology;  Laterality: Right;  1 HR   CYSTOSCOPY WITH RETROGRADE PYELOGRAM, URETEROSCOPY AND STENT PLACEMENT Right 03/28/2020   Procedure: CYSTOSCOPY WITH RIGHT RETROGRADE PYELOGRAM, URETEROSCOPY WITH HOLMIUM LASER AND STENT PLACEMENT;  Surgeon: Bjorn Pippin, MD;  Location: [facility];  Service: Urology;  Laterality: Right;   CYSTOSCOPY WITH RETROGRADE PYELOGRAM, URETEROSCOPY AND STENT PLACEMENT Left 10/22/2021   Procedure: CYSTOSCOPY WITH LEFT RETROGRADE URETEROSCOPY WIHT HOLMIUM LASERAND STENT PLACEMENT;  Surgeon: Bjorn Pippin, MD;  Location: [facility];  Service: Urology;  Laterality: Left;   CYSTOSCOPY/RETROGRADE/URETEROSCOPY/STONE EXTRACTION WITH BASKET Right 10/27/2017   Procedure: CYSTOSCOPY/RETROGRADE/URETEROSCOPY/STONE EXTRACTION WITH BASKET/URETERAL STENT PLACEMENT and laser;  Surgeon: Bjorn Pippin, MD;  Location: [facility];  Service: Urology;  Laterality: Right;   CYSTOSCOPY/URETEROSCOPY/HOLMIUM LASER/STENT PLACEMENT Right 01/03/2023   Procedure: CYSTOSCOPY RIGHT RETROGRADE URETEROSCOPY/HOLMIUM LASER/STENT PLACEMENT;  Surgeon: Bjorn Pippin, MD;  Location: [facility];  Service: Urology;  Laterality: Right;   EXTRACORPOREAL SHOCK WAVE LITHOTRIPSY Left 08/10/2020   Procedure: EXTRACORPOREAL SHOCK WAVE LITHOTRIPSY (ESWL);  Surgeon: Crist Fat, MD;  Location: [facility];  Service: Urology;  Laterality: Left;   EXTRACORPOREAL SHOCK WAVE LITHOTRIPSY Right 12/16/2022   Procedure: RIGHT  EXTRACORPOREAL SHOCK WAVE LITHOTRIPSY (ESWL);  Surgeon: Bjorn Pippin, MD;  Location: [facility];  Service: Urology;  Laterality: Right;   HOLMIUM LASER APPLICATION Right 10/27/2017   Procedure: POSSIBLE HOLMIUM LASER APPLICATION;  Surgeon: Bjorn Pippin, MD;  Location: [facility];  Service: Urology;  Laterality: Right;   HOLMIUM LASER APPLICATION Right 10/08/2019   Procedure: HOLMIUM LASER APPLICATION;  Surgeon: Malen Gauze, MD;  Location: [facility];  Service: Urology;  Laterality: Right;   INTRAOPERATIVE TRANSTHORACIC ECHOCARDIOGRAM N/A 06/10/2023   Procedure: INTRAOPERATIVE TRANSTHORACIC ECHOCARDIOGRAM;  Surgeon: Tonny Bollman, MD;  Location: [facility] INVASIVE CV LAB;  Service: Cardiovascular;  Laterality: N/A;   LEFT HEART CATH AND CORONARY ANGIOGRAPHY  06/30/2003   Dr. Riley Kill: NO Significant/Obstructive CAD; Preseved LVEF (for Recurrent Afib)   NASAL SINUS SURGERY     x 2   NM MYOVIEW LTD  02/11/2021  Lexiscan:EF 55-65%.  Normal function.  No ischemia or infarction.  No EKG changes noted.   PROXIMAL INTERPHALANGEAL FUSION (PIP) Left 01-05-2001    dr graves   correction clawtoe and extensor tendon lengthening   RIGHT/LEFT HEART CATH AND CORONARY ANGIOGRAPHY N/A 06/28/2016   Procedure: Right/Left Heart Cath and Coronary Angiography;  Surgeon: Marykay Lex, MD;  Location: [facility] INVASIVE CV LAB;  Service: Cardiovascular: mRCA 99% (TIMI 2), mLAD ~65% (FFR 0.75), OM3 55%. mild Pulm HTN. Mod LVEDP elevation. EF 55-65%.   RIGHT/LEFT HEART CATH AND CORONARY ANGIOGRAPHY N/A 10/11/2020   Procedure: RIGHT/LEFT HEART CATH AND CORONARY ANGIOGRAPHY;  Surgeon: Marykay Lex, MD;  Location: [facility] INVASIVE CV LAB;; Patent DES in mRCA & p-mLAD. CULPRIT = m-d LAD 70% (just after D1 w/ Ost 60%) FFR 0.75 => DES PCI m-dLAD & PTCA ost D1 (overlapping prior stent distally & crossing D2 w/  Ost D2 post PTCA). RIGHT HEART CATH PRESSURES,  & CO-CI ARE NORMAL. NO PULM HTN   RIGHT/LEFT HEART  CATH AND CORONARY ANGIOGRAPHY N/A 07/04/2021   Procedure: RIGHT/LEFT HEART CATH AND CORONARY ANGIOGRAPHY;  Surgeon: Marykay Lex, MD;  Location: [facility] INVASIVE CV LAB;  Service: Cardiovascular;; moderately elevated LVEDP of 20 mmHg, and PCWP-28 mmHg -> mean PAP of 24 mmHg.  Moderate AS-mean gradient 33 mmHg.  (Similar to Echo 26 mmHg); borderline low BP.  CO-CI 9.2 and 4.0.. Stable CAD W/ progression of ISR of LAD stent at D2 => PCI   RIGHT/LEFT HEART CATH AND CORONARY ANGIOGRAPHY N/A 05/27/2023   Procedure: RIGHT/LEFT HEART CATH AND CORONARY ANGIOGRAPHY;  Surgeon: Marykay Lex, MD;  Location: [facility] INVASIVE CV LAB;  Service: Cardiovascular;  Laterality: N/A;   ROTATOR CUFF REPAIR Bilateral last one 2014   TONSILLECTOMY  age 53   TRANSTHORACIC ECHOCARDIOGRAM  09/29/2020   EF > 75%, Hyperdynamic. No RWMA. Mild LVH. ~ DD with mild LA dilation. Mild AI. Normal RV size & fxn, ? Dilated IVC c/w CVP ~ 15 mHg.   TRANSTHORACIC ECHOCARDIOGRAM  04/12/2021   EF estimated 65%.  Normal LV size and function.  No RWMA.  Mild LVH.  GRII DD.  Elevated LVEDP (c/w HFpEF).  Normal RV size.  Mildly elevated PAP estimated 36 mmHg.  Mild MR.  Mild AS (mean gradient 26 mmHg.  DI 0.32.  Aortic root 40 mm, ascending aorta 41 mm.   URETEROSCOPIC LASER LITHOTRIPSY STONE EXTRACTIONS/  STENT PLACEMENT Bilateral 09-10-2007   dr Annabell Howells  [facility]   per pt has had several prior ureteroscopic stone extraction since age 24 , last one 09-10-2007   Zio patch monitor:  02/2021   Probably Sinus Rhythm: Heart rate 56-146 bpm, average 83 bpm.  Frequent PACs noted with rare isolated PVCs.  Multiple episodes of PAT/atrial runs-longest 23 seconds average heart rate 95 bpm, fastest 5 beats at a max rate of 231 bpm.  No evidence of A-fib or atrial flutter.  No prolonged or sustained SVT/PAT.    Social History: Social History   Tobacco Use   Smoking status: Former    Types: Cigars    Quit date: 09/29/1997    Years since quitting: 25.7    Smokeless tobacco: Former    Types: Snuff, Chew   Tobacco comments:    Form smoker and chew tobacco 01/25/2021  Vaping Use   Vaping status: Never Used  Substance Use Topics   Alcohol use: No   Drug use: No    Allergies  Allergen Reactions   Hydrocodone Shortness  Of Breath    In combination with decongestants   Other Palpitations and Other (See Comments)    Reaction to all decongestants - palpitations, irregular heart rhythm   Oxycontin [Oxycodone] Shortness Of Breath   Sudafed [Pseudoephedrine] Palpitations   Ultram [Tramadol] Shortness Of Breath, Rash and Other (See Comments)    Flushing    Lopid [Gemfibrozil] Other (See Comments)    Dizziness      Family History  Problem Relation Age of Onset   Coronary artery disease Mother    Breast cancer Mother    Colon cancer Mother    Heart disease Maternal Grandmother        MI   Stroke Paternal Grandfather        CVA   Coronary artery disease Paternal Grandfather    Esophageal cancer Neg Hx    Stomach cancer Neg Hx    Rectal cancer Neg Hx      Prior to Admission medications   Medication Sig Start Date End Date Taking? Authorizing Provider  acetaminophen (TYLENOL) 500 MG tablet Take 1,000 mg by mouth every 6 (six) hours as needed for mild pain (pain score 1-3) or moderate pain (pain score 4-6).    [provider]  Adalimumab-bwwd (HADLIMA) 40 MG/0.4ML SOSY Inject 40 mg into the skin every 14 (fourteen) days.    [provider]  amoxicillin (AMOXIL) 500 MG tablet Take 4 tablets (2,000 mg total) by mouth as directed. 1 hour prior to dental work including cleanings 06/16/23   Janetta Hora, PA-C  apixaban (ELIQUIS) 5 MG TABS tablet Take 1 tablet (5 mg total) by mouth 2 (two) times daily. 01/12/21   Fenton, Clint R, PA  cetirizine (ZYRTEC) 10 MG tablet Take 10 mg by mouth daily.    [provider]  Dorzolamide HCl-Timolol Mal PF 2-0.5 % SOLN Place 1 drop into both eyes at bedtime.    [provider]  empagliflozin (JARDIANCE) 25 MG TABS tablet Take 12.5 mg by mouth daily. 12/26/21   [provider]  ferrous sulfate 325 (65 FE) MG tablet Take 325 mg by mouth every Monday, Wednesday, and Friday.    Ronney Asters, NP  furosemide (LASIX) 20 MG tablet Take 20 mg by mouth See admin instructions. Take 1 tablet (20mg ) twice a week on Monday and Thursday.    [provider]  galantamine (RAZADYNE ER) 24 MG 24 hr capsule Take 24 mg by mouth in the morning. 08/30/20   [provider]  memantine (NAMENDA) 10 MG tablet Take 20 mg by mouth at bedtime. 08/30/20   [provider]  metFORMIN (GLUCOPHAGE) 500 MG tablet Take 500 mg by mouth at bedtime.    [provider]  Multiple Vitamins-Minerals (MULTIVITAMIN GUMMIES MENS) CHEW Chew 1 each by mouth daily.    [provider]  nitroGLYCERIN (NITROSTAT) 0.4 MG SL tablet Place 1 tablet (0.4 mg total) under the tongue every 5 (five) minutes as needed. 07/04/21   Arty Baumgartner, NP  omeprazole (PRILOSEC OTC) 20 MG tablet Take 20 mg by mouth daily as needed (Acid reflux).    [provider]  predniSONE (DELTASONE) 5 MG tablet Take 1 tablet by mouth once daily with breakfast 03/07/23   Oretha Milch, MD  ranolazine (RANEXA) 500 MG 12 hr tablet Take 1 tablet (500 mg total) by mouth 2 (two) times daily. 11/11/22   Marykay Lex, MD  rosuvastatin (CRESTOR) 40 MG tablet Take 1 tablet (40 mg total) by  mouth daily. Patient taking differently: Take 40 mg by mouth at bedtime. 12/28/21   Marykay Lex, MD  tamsulosin (FLOMAX) 0.4 MG CAPS capsule Take 1 capsule by mouth once daily Patient taking differently: Take 0.4 mg by mouth daily as needed (kidney stones). 03/13/21   Malen Gauze, MD    Physical Exam: Vitals:   06/21/23 1800 06/21/23 1915 06/21/23 1950 06/21/23 1954  BP: 135/72  (!) 152/71 (!) 152/71  Pulse: 68 68 68 68  Resp: 19 13  18   Temp:   97.8 F (36.6 C) 97.8 F (36.6  C)  TempSrc:   Oral Oral  SpO2: 100% 100%    Weight:    92.6 kg  Height:    6\' 3"  (1.905 m)   Constitutional: Resting in bed with head elevated, initially asleep with good respiratory rate but when woken began to breathe with increased respiratory rate Eyes: EOMI, lids and conjunctivae normal ENMT: Mucous membranes are moist. Posterior pharynx clear of any exudate or lesions.Normal dentition.  Neck: normal, supple, no masses. Respiratory: Faint inspiratory crackles at the bases otherwise clear to auscultation.  Increased respiratory effort. No accessory muscle use.  Cardiovascular: Regular rate and rhythm, no murmurs / rubs / gallops. No extremity edema. 2+ pedal pulses. Abdomen: no tenderness, no masses palpated. Musculoskeletal: no clubbing / cyanosis. No joint deformity upper and lower extremities. Good ROM, no contractures. Normal muscle tone.  Skin: no rashes, lesions, ulcers. No induration Neurologic: Sensation intact. Strength 5/5 in all 4.  Psychiatric: Normal judgment and insight. Alert and oriented x 3.  Anxious mood.   EKG: Personally reviewed. Sinus rhythm, rate 79, nonspecific T wave change lead III.  Similar to prior.  Assessment/Plan Principal Problem:   Dyspnea Active Problems:   Chronic heart failure with preserved ejection fraction (HFpEF) (HCC)   S/P TAVR (transcatheter aortic valve replacement)   ILD (interstitial lung disease) (HCC)   Paroxysmal atrial fibrillation (HCC)   CAD S/P percutaneous coronary angioplasty   Hyperlipidemia associated with type 2 diabetes mellitus (HCC)   Controlled type 2 diabetes mellitus without complication, without long-term current use of insulin (HCC)   Hypertension associated with diabetes (HCC)   Kenneth [name] is a 78 y.o. male with medical history significant for CAD s/p PCI, PAF on Eliquis, HFpEF (60-65%), severe AS s/p TAVR (06/10/2023), ILD on chronic prednisone 5 mg daily, T2DM, HTN, HLD, psoriatic arthritis on adalimumab  who is admitted with dyspnea, overnight observation and repeat echocardiogram in the morning per cardiology recommendation.  Assessment and Plan: Dyspnea Chronic HFpEF Severe AS s/p TAVR 06/10/2023: Patient with recent TAVR, did well postoperatively until developing worsening dyspnea including at rest over the last few days.  CXR with mild diffuse interstitial opacities bilaterally, could be edema versus changes from ILD.  Seen by cardiology, bedside echo without clear evidence of LV or aortic valve dysfunction.  Trope negative x 2.  COVID, flu, RSV negative.  BNP 59.1.  Faint basilar crackles on exam otherwise clear. -Give IV Lasix 40 mg once now and monitor response -Obtain echocardiogram -Monitor I/O's -Cardiology following  Interstitial lung disease: Follows with pulmonology Dr. Vassie Loll.  Felt to be NSIP.  Maintained on chronic prednisone 5 mg daily.  No evidence of acute exacerbation.  Saturating well on room air however was placed on supplemental O2 in the ED per patient preference for comfort. -Continue prednisone 5 mg daily  Paroxysmal atrial fibrillation: Stable, in sinus rhythm on admission.  Not on rate/rhythm control.  Continue  Eliquis.  CAD s/p PCI: Patient reports atypical left lower chest discomfort intermittently.  Troponin is negative x 2.  EKG without acute ischemic changes.  Doubt ACS. -Continue Ranexa, rosuvastatin, Eliquis -Follow echo  Type 2 diabetes: Holding metformin.  Placed on SSI.  Hypertension Not currently on antihypertensive.  Hyperlipidemia: Continue rosuvastatin.  Memory issues: Continue Namenda and galantamine.   DVT prophylaxis:  apixaban (ELIQUIS) tablet 5 mg   Code Status: Full code, confirmed with patient on admission Family Communication: [place] with patient, he has discussed with family Disposition Plan: From home for likely discharge to home pending clinical progress Consults called: Cardiology Severity of Illness: The appropriate  patient status for this patient is OBSERVATION. Observation status is judged to be reasonable and necessary in order to provide the required intensity of service to ensure the patient's safety. The patient's presenting symptoms, physical exam findings, and initial radiographic and laboratory data in the context of their medical condition is felt to place them at decreased risk for further clinical deterioration. Furthermore, it is anticipated that the patient will be medically stable for discharge from the hospital within 2 midnights of admission.   Darreld Mclean MD [facility]  If 7PM-7AM, please contact night-coverage [email or web address]  06/21/2023, 8:14 PM

## 2023-06-21 NOTE — ED Triage Notes (Signed)
 BIB POV from home for sob, recent Aortic valve surgery 3/4. D/c'd 3-4d ago. C/o sudden onset sob, denies pain, anxious, hyperventilating. Clammy.

## 2023-06-21 NOTE — ED Notes (Signed)
 Admitting at bedside

## 2023-06-21 NOTE — Consult Note (Signed)
 Cardiology Consultation:   Patient ID: MORGEN RITACCO; [id number]; 1946/04/04   Admit date: 06/21/2023 Date of Consult: 06/21/2023  Primary Care Provider: [facility] Primary Cardiologist: Bryan Lemma, MD  Primary Electrophysiologist:  None   Patient Profile:   Kenneth [name] is a 78 y.o. male with a hx of recent TAVR who is being seen today for the evaluation of acute shortness of breath at the request of Dr. Rubin Payor.  History of Present Illness:   Mr. Prom has a history of atrial flutter status post ablation.  He has coronary artery disease status post multiple interventions. He had severe aortic stenosis and underwent TAVR. 2025.  He had an Edwards SAPIEN 3 valve.  This was a transfemoral approach on 06/10/23.  He was seen in the clinic in follow-up on 310 and was doing okay.  However, he now presents to the emergency room.  In the emergency room BNP was normal.  EKG demonstrates sinus rhythm rate 74 with no acute ST-T wave changes.  There are some baseline artifact but it does look to be sinus rhythm.  P waves are not absolutely clear but it is regular.  It is not different than his previous.  1 BNP was negative.  Respiratory panel is pending.  Chest x-ray suggests mild diffuse interstitial opacities bilaterally edema versus atypical infection.  He reports that over the last couple of days he had some trouble catching his breath but it would pass.  He felt chills but did not have cough or fever reported.  He did not have chest pain, neck or arm pain,  He had on PND or orthopnea.  He has had no weight gain or edema.   He had no  palpitations.  He woke this morning without acute complaints and today felt acutely more SOB.   His wife told him to take some prednisone today.  He had no improvement with this  Of note he does have a history of interstitial lung disease.  He had repeat cardiac catheterization prior to his TAVR.  Right-sided pressures demonstrated a wedge of 10  and a mean pulmonary pressure of 22.  He had nonobstructive large vessel disease.  Previously placed LAD stent was widely patent proximally and there were overlapping mid stents and has 50% restenosis.  There is a mid RCA stent which was widely patent.  Past Medical History:  Diagnosis Date   Arthritis    CAD S/P percutaneous coronary angioplasty 06/28/2016   Cardiologist- Dr. Herbie Baltimore: 06/28/16 -- [facility] PCI (Synergy DES 2.75 x 20 mm); 07/10/16: Staged p-m LAD PCI (FFR 0.75) - DES PCI (Synergy DES 3.5 x 20 mm); 10/11/2020: mid-distal LAD 70% (FFR 0.75) DES PCI (Synergy DES 2.5 mm x 32 mm - overlaps prox-mid stent, tapered post-dilation 3.7 - 2.6 mm.;  06/2021 - Cath with severe LAD-D2 bifurcation ISR -- PTCA / kissing balloon - reduced LAD to ~25% & D2 to 40%   Chronic dryness of both eyes    Chronic heart failure with preserved ejection fraction (HFpEF) (HCC) 05/22/2013   Diverticulosis of colon    Epiretinal membrane    "epiretinal attachment" (06/28/2016) 10-14-2017 per pt oringinally in both , now only unilateral    GERD (gastroesophageal reflux disease)    Hiatal hernia    History of adenomatous polyp of colon    History of concussion 08/23/2017   w/o loc--- per pt no residual   History of kidney stones    History of rheumatic fever 1958  NO Evidence of Vavlular disease -> only mild Aortic Sclerosis   History of skin cancer    excision leg;  froze the left arm--- unsure BCC or SCC    Hyperlipidemia    Hypertension    ILD (interstitial lung disease) (HCC)    pulmologist-  dr Vassie Loll--  secondary to methotrexate use --- last PFTs 04-25-2015  mild restriction   Iron deficiency anemia    Mild obstructive sleep apnea    10-04-2019 per pt  had a sleep study years ago , was told no cpap recommended pt denies    Nephrolithiasis    CT 09-07-2019 bilateral nonobstructive stones   Paroxysmal atrial fibrillation (HCC) 2015   a. 11/2003 Tikosyn initiated - subsequently d/c'd;  b. 2006 RFCA for Afib @  [facility];  c. 09/2011 Echo: EF 60-65%, Gr 2 DD;  d. DCCV 10/2011 , 04/2012, 12/ 2014, & 02/ 2015;  e. RFCA 01-13-2014   Plaque psoriasis 1969   follow[facility] rheumatologist  and [facility] [place]   PONV (postoperative nausea and vomiting)    Right ureteral stone    S/P drug eluting coronary stent placement    06-28-2016  x1 to mRCA;  07-10-2016 x1 to mLAD   S/P TAVR (transcatheter aortic valve replacement) 06/10/2023   s/p TAVR with a 29 mm Edwards S3UR via TF approach by Drs Excell Seltzer and Laneta Simmers   Scarring of lung    Seasonal allergies    Short-term memory loss    Type 2 diabetes mellitus (HCC)    followed by pcp   Ureteral calculus, right    Wears hearing aid in both ears     Past Surgical History:  Procedure Laterality Date   APPENDECTOMY  1996   ATRIAL FIBRILLATION ABLATION N/A 01/13/2014   repeat PVI, also left atrial ablation performed with successfull ablation of LA flutter by Dr Johney Frame   ATRIAL FIBRILLATION ABLATION  08/2004   Dr Delena Serve at [facility] , [place])   CARDIAC STRESS PET  03/12/2022   Normal perfusion study/low risk.  Normal LVEF and normal LVEF preserved.  Suspect reduced myocardial blood flow reserve related to microvascular disease.  No evidence of infarction or ischemia.  Resting EF 48%.  Stress EF 54%.  Mildly enlarged LV. Asc Ao 4.1 cm--stable.  Also stable mosaic attenuation in the lungs no change.   CARDIOVERSION N/A 05/22/2013   Procedure: CARDIOVERSION;  Surgeon: Hillis Range, MD;  Location: [facility] OR;  Service: Cardiovascular;  Laterality: N/A;   CARDIOVERSION N/A 10/23/2011   Procedure: CARDIOVERSION;  Surgeon: Duke Salvia, MD;  Location: [facility] CATH LAB;  Service: Cardiovascular;  Laterality: N/A;   CARDIOVERSION N/A 05/01/2012   Procedure: CARDIOVERSION;  Surgeon: Duke Salvia, MD;  Location: [facility] CATH LAB;  Service: Cardiovascular;  Laterality: N/A;   CARPAL TUNNEL RELEASE Right 1980s   CATARACT EXTRACTION W/ INTRAOCULAR LENS  IMPLANT, BILATERAL   2012  approx.   CORONARY BALLOON ANGIOPLASTY N/A 07/04/2021   Procedure: CORONARY BALLOON ANGIOPLASTY;  Surgeon: Marykay Lex, MD;  Location: [facility] INVASIVE CV LAB;  Service: Cardiovascular;; scoring balloon and an [place] balloon PTCA of LAD; with kissing balloon PTCA of LAD-D2 bifurcation stented segment reducing LAD to 20 % ISR and ostial D2 to 40%.   CORONARY PRESSURE/FFR STUDY N/A 06/28/2016   Procedure: Intravascular Pressure Wire/FFR Study;  Surgeon: Marykay Lex, MD;  Location: [facility] INVASIVE CV LAB;  Service: Cardiovascular: FFR of mLAD ~65% lesion = 0.75 post --> Staged PCI  CORONARY PRESSURE/FFR STUDY N/A 10/11/2020   Procedure: INTRAVASCULAR PRESSURE WIRE/FFR STUDY;  Surgeon: Marykay Lex, MD;  Location: [facility] INVASIVE CV LAB;  Service: Cardiovascular;  Laterality: LAD mid-distal just after D2 --> FFR 0.75 SIGNIFICANT => PCI   CORONARY STENT INTERVENTION N/A 06/28/2016   Procedure: Coronary Stent Intervention;  Surgeon: Marykay Lex, MD;  Location: [facility] INVASIVE CV LAB;  Service: Cardiovascular;  Laterality: mRCA PCI -Synergy DES 2.75 m x 20 mm (3.1 mm)   CORONARY STENT INTERVENTION N/A 07/10/2016   Procedure: Coronary Stent Intervention;  Surgeon: Marykay Lex, MD;  Location: [facility] INVASIVE CV LAB;  Service: Cardiovascular: mLAD PCI Synergy DES 3.5 mm x 20 mm)   CORONARY STENT INTERVENTION N/A 10/11/2020   Procedure: CORONARY STENT INTERVENTION;  Surgeon: Marykay Lex, MD;  Location: [facility] INVASIVE CV LAB;  Service: Cardiovascular: mid-distal LAD ~70% (FFR 0.75): SYNERGY DES 2.5 MM X 32 MM (overlaps prox-mid Stent from 07/2016) - Tapered post-dilation 3.7 mm ->2.6 mm; Jailed D2 PTCA reduced 60% to 40%.   CYSTOSCOPY WITH RETROGRADE PYELOGRAM, URETEROSCOPY AND STENT PLACEMENT Right 10/16/2017   Procedure: CYSTOSCOPY WITH RIGHT RETROGRADE RIGHT URETEROSCOPY AND STONE BASKET EXTRACTION;  Surgeon: Bjorn Pippin, MD;  Location: [facility];  Service: Urology;  Laterality: Right;    CYSTOSCOPY WITH RETROGRADE PYELOGRAM, URETEROSCOPY AND STENT PLACEMENT Right 10/08/2019   Procedure: CYSTOSCOPY WITH URETEROSCOPY; RETROGRADE PYELOGRAM; STONE BASKET EXTRACTION;  STENT PLACEMENT;  Surgeon: Malen Gauze, MD;  Location: [facility];  Service: Urology;  Laterality: Right;  1 HR   CYSTOSCOPY WITH RETROGRADE PYELOGRAM, URETEROSCOPY AND STENT PLACEMENT Right 03/28/2020   Procedure: CYSTOSCOPY WITH RIGHT RETROGRADE PYELOGRAM, URETEROSCOPY WITH HOLMIUM LASER AND STENT PLACEMENT;  Surgeon: Bjorn Pippin, MD;  Location: [facility];  Service: Urology;  Laterality: Right;   CYSTOSCOPY WITH RETROGRADE PYELOGRAM, URETEROSCOPY AND STENT PLACEMENT Left 10/22/2021   Procedure: CYSTOSCOPY WITH LEFT RETROGRADE URETEROSCOPY WIHT HOLMIUM LASERAND STENT PLACEMENT;  Surgeon: Bjorn Pippin, MD;  Location: [facility];  Service: Urology;  Laterality: Left;   CYSTOSCOPY/RETROGRADE/URETEROSCOPY/STONE EXTRACTION WITH BASKET Right 10/27/2017   Procedure: CYSTOSCOPY/RETROGRADE/URETEROSCOPY/STONE EXTRACTION WITH BASKET/URETERAL STENT PLACEMENT and laser;  Surgeon: Bjorn Pippin, MD;  Location: [facility];  Service: Urology;  Laterality: Right;   CYSTOSCOPY/URETEROSCOPY/HOLMIUM LASER/STENT PLACEMENT Right 01/03/2023   Procedure: CYSTOSCOPY RIGHT RETROGRADE URETEROSCOPY/HOLMIUM LASER/STENT PLACEMENT;  Surgeon: Bjorn Pippin, MD;  Location: [facility];  Service: Urology;  Laterality: Right;   EXTRACORPOREAL SHOCK WAVE LITHOTRIPSY Left 08/10/2020   Procedure: EXTRACORPOREAL SHOCK WAVE LITHOTRIPSY (ESWL);  Surgeon: Crist Fat, MD;  Location: [facility];  Service: Urology;  Laterality: Left;   EXTRACORPOREAL SHOCK WAVE LITHOTRIPSY Right 12/16/2022   Procedure: RIGHT EXTRACORPOREAL SHOCK WAVE LITHOTRIPSY (ESWL);  Surgeon: Bjorn Pippin, MD;  Location: [facility];  Service: Urology;  Laterality: Right;   HOLMIUM LASER APPLICATION Right 10/27/2017   Procedure: POSSIBLE HOLMIUM LASER  APPLICATION;  Surgeon: Bjorn Pippin, MD;  Location: [facility];  Service: Urology;  Laterality: Right;   HOLMIUM LASER APPLICATION Right 10/08/2019   Procedure: HOLMIUM LASER APPLICATION;  Surgeon: Malen Gauze, MD;  Location: [facility];  Service: Urology;  Laterality: Right;   INTRAOPERATIVE TRANSTHORACIC ECHOCARDIOGRAM N/A 06/10/2023   Procedure: INTRAOPERATIVE TRANSTHORACIC ECHOCARDIOGRAM;  Surgeon: Tonny Bollman, MD;  Location: [facility] INVASIVE CV LAB;  Service: Cardiovascular;  Laterality: N/A;   LEFT HEART CATH AND CORONARY ANGIOGRAPHY  06/30/2003   Dr. Riley Kill: NO Significant/Obstructive CAD; Preseved LVEF (for Recurrent Afib)   NASAL SINUS SURGERY  x 2   NM MYOVIEW LTD  02/11/2021   Lexiscan:EF 55-65%.  Normal function.  No ischemia or infarction.  No EKG changes noted.   PROXIMAL INTERPHALANGEAL FUSION (PIP) Left 01-05-2001    dr graves   correction clawtoe and extensor tendon lengthening   RIGHT/LEFT HEART CATH AND CORONARY ANGIOGRAPHY N/A 06/28/2016   Procedure: Right/Left Heart Cath and Coronary Angiography;  Surgeon: Marykay Lex, MD;  Location: [facility] INVASIVE CV LAB;  Service: Cardiovascular: mRCA 99% (TIMI 2), mLAD ~65% (FFR 0.75), OM3 55%. mild Pulm HTN. Mod LVEDP elevation. EF 55-65%.   RIGHT/LEFT HEART CATH AND CORONARY ANGIOGRAPHY N/A 10/11/2020   Procedure: RIGHT/LEFT HEART CATH AND CORONARY ANGIOGRAPHY;  Surgeon: Marykay Lex, MD;  Location: [facility] INVASIVE CV LAB;; Patent DES in mRCA & p-mLAD. CULPRIT = m-d LAD 70% (just after D1 w/ Ost 60%) FFR 0.75 => DES PCI m-dLAD & PTCA ost D1 (overlapping prior stent distally & crossing D2 w/  Ost D2 post PTCA). RIGHT HEART CATH PRESSURES,  & CO-CI ARE NORMAL. NO PULM HTN   RIGHT/LEFT HEART CATH AND CORONARY ANGIOGRAPHY N/A 07/04/2021   Procedure: RIGHT/LEFT HEART CATH AND CORONARY ANGIOGRAPHY;  Surgeon: Marykay Lex, MD;  Location: [facility] INVASIVE CV LAB;  Service: Cardiovascular;; moderately elevated LVEDP of 20 mmHg,  and PCWP-28 mmHg -> mean PAP of 24 mmHg.  Moderate AS-mean gradient 33 mmHg.  (Similar to Echo 26 mmHg); borderline low BP.  CO-CI 9.2 and 4.0.. Stable CAD W/ progression of ISR of LAD stent at D2 => PCI   RIGHT/LEFT HEART CATH AND CORONARY ANGIOGRAPHY N/A 05/27/2023   Procedure: RIGHT/LEFT HEART CATH AND CORONARY ANGIOGRAPHY;  Surgeon: Marykay Lex, MD;  Location: [facility] INVASIVE CV LAB;  Service: Cardiovascular;  Laterality: N/A;   ROTATOR CUFF REPAIR Bilateral last one 2014   TONSILLECTOMY  age 45   TRANSTHORACIC ECHOCARDIOGRAM  09/29/2020   EF > 75%, Hyperdynamic. No RWMA. Mild LVH. ~ DD with mild LA dilation. Mild AI. Normal RV size & fxn, ? Dilated IVC c/w CVP ~ 15 mHg.   TRANSTHORACIC ECHOCARDIOGRAM  04/12/2021   EF estimated 65%.  Normal LV size and function.  No RWMA.  Mild LVH.  GRII DD.  Elevated LVEDP (c/w HFpEF).  Normal RV size.  Mildly elevated PAP estimated 36 mmHg.  Mild MR.  Mild AS (mean gradient 26 mmHg.  DI 0.32.  Aortic root 40 mm, ascending aorta 41 mm.   URETEROSCOPIC LASER LITHOTRIPSY STONE EXTRACTIONS/  STENT PLACEMENT Bilateral 09-10-2007   dr Annabell Howells  [facility]   per pt has had several prior ureteroscopic stone extraction since age 51 , last one 09-10-2007   Zio patch monitor:  02/2021   Probably Sinus Rhythm: Heart rate 56-146 bpm, average 83 bpm.  Frequent PACs noted with rare isolated PVCs.  Multiple episodes of PAT/atrial runs-longest 23 seconds average heart rate 95 bpm, fastest 5 beats at a max rate of 231 bpm.  No evidence of A-fib or atrial flutter.  No prolonged or sustained SVT/PAT.     Home Medications:  Prior to Admission medications   Medication Sig Start Date End Date Taking? Authorizing Provider  acetaminophen (TYLENOL) 500 MG tablet Take 1,000 mg by mouth every 6 (six) hours as needed for mild pain (pain score 1-3) or moderate pain (pain score 4-6).    [provider]  Adalimumab-bwwd (HADLIMA) 40 MG/0.4ML SOSY Inject 40 mg into the skin every 14  (fourteen) days.    [provider]  amoxicillin (  AMOXIL) 500 MG tablet Take 4 tablets (2,000 mg total) by mouth as directed. 1 hour prior to dental work including cleanings 06/16/23   Janetta Hora, PA-C  apixaban (ELIQUIS) 5 MG TABS tablet Take 1 tablet (5 mg total) by mouth 2 (two) times daily. 01/12/21   Fenton, Clint R, PA  cetirizine (ZYRTEC) 10 MG tablet Take 10 mg by mouth daily.    [provider]  Dorzolamide HCl-Timolol Mal PF 2-0.5 % SOLN Place 1 drop into both eyes at bedtime.    [provider]  empagliflozin (JARDIANCE) 25 MG TABS tablet Take 12.5 mg by mouth daily. 12/26/21   [provider]  ferrous sulfate 325 (65 FE) MG tablet Take 325 mg by mouth every Monday, Wednesday, and Friday.    Ronney Asters, NP  furosemide (LASIX) 20 MG tablet Take 20 mg by mouth See admin instructions. Take 1 tablet (20mg ) twice a week on Monday and Thursday.    [provider]  galantamine (RAZADYNE ER) 24 MG 24 hr capsule Take 24 mg by mouth in the morning. 08/30/20   [provider]  memantine (NAMENDA) 10 MG tablet Take 20 mg by mouth at bedtime. 08/30/20   [provider]  metFORMIN (GLUCOPHAGE) 500 MG tablet Take 500 mg by mouth at bedtime.    [provider]  Multiple Vitamins-Minerals (MULTIVITAMIN GUMMIES MENS) CHEW Chew 1 each by mouth daily.    [provider]  nitroGLYCERIN (NITROSTAT) 0.4 MG SL tablet Place 1 tablet (0.4 mg total) under the tongue every 5 (five) minutes as needed. 07/04/21   Arty Baumgartner, NP  omeprazole (PRILOSEC OTC) 20 MG tablet Take 20 mg by mouth daily as needed (Acid reflux).    [provider]  predniSONE (DELTASONE) 5 MG tablet Take 1 tablet by mouth once daily with breakfast 03/07/23   Oretha Milch, MD  ranolazine (RANEXA) 500 MG 12 hr tablet Take 1 tablet (500 mg total) by mouth 2 (two) times daily. 11/11/22   Marykay Lex, MD  rosuvastatin (CRESTOR) 40 MG tablet  Take 1 tablet (40 mg total) by mouth daily. Patient taking differently: Take 40 mg by mouth at bedtime. 12/28/21   Marykay Lex, MD  tamsulosin (FLOMAX) 0.4 MG CAPS capsule Take 1 capsule by mouth once daily Patient taking differently: Take 0.4 mg by mouth daily as needed (kidney stones). 03/13/21   McKenzie, Mardene Celeste, MD    Inpatient Medications: Scheduled Meds:  Continuous Infusions:  PRN Meds:   Allergies:    Allergies  Allergen Reactions   Hydrocodone Shortness Of Breath    In combination with decongestants   Other Palpitations and Other (See Comments)    Reaction to all decongestants - palpitations, irregular heart rhythm   Oxycontin [Oxycodone] Shortness Of Breath   Sudafed [Pseudoephedrine] Palpitations   Ultram [Tramadol] Shortness Of Breath, Rash and Other (See Comments)    Flushing    Lopid [Gemfibrozil] Other (See Comments)    Dizziness      Social History:   Social History   Socioeconomic History   Marital status: Married    Spouse name: Not on file   Number of children: 1   Years of education: Not on file   Highest education level: Not on file  Occupational History   Occupation: Retired Psychologist, sport and exercise  Tobacco Use   Smoking status: Former    Types: Cigars    Quit date: 09/29/1997    Years since quitting: 25.7  Smokeless tobacco: Former    Types: Snuff, Chew   Tobacco comments:    Form smoker and chew tobacco 01/25/2021  Vaping Use   Vaping status: Never Used  Substance and Sexual Activity   Alcohol use: No   Drug use: No   Sexual activity: Not on file  Other Topics Concern   Not on file  Social History Narrative   Pt lives in [place] with spouse.     Retired.    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 -> subsequently worked in [employer] (seconded to [employer]).       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        Had been very active as an  Charity fundraiser"  - now doing his best to "stay out of this mess of politics" -- has declined efforts to get him to run for office.      Now is primary caregiver for his wife [name]) - who has metastatic CA & has had CVA x 2.  Pretty much "total care".  Very emotionally taxing.      He has plans to build homes for all of his children & grandchildren on their property - to ensure they all have a place to live - Not transferable   Social Drivers of Health   Financial Resource Strain: Not on file  Food Insecurity: No Food Insecurity (06/04/2023)   Hunger Vital Sign    Worried About Running Out of Food in the Last Year: Never true    Ran Out of Food in the Last Year: Never true  Transportation Needs: No Transportation Needs (06/04/2023)   PRAPARE - Administrator, Civil Service (Medical): No    Lack of Transportation (Non-Medical): No  Physical Activity: Not on file  Stress: Not on file  Social Connections: Moderately Integrated (06/04/2023)   Social Connection and Isolation Panel [NHANES]    Frequency of Communication with Friends and Family: More than three times a week    Frequency of Social Gatherings with Friends and Family: Three times a week    Attends Religious Services: 1 to 4 times per year    Active Member of Clubs or Organizations: No    Attends Banker Meetings: Never    Marital Status: Married  Catering manager Violence: Not At Risk (06/04/2023)   Humiliation, Afraid, Rape, and Kick questionnaire    Fear of Current or Ex-Partner: No    Emotionally Abused: No    Physically Abused: No    Sexually Abused: No    Family History:    Family History  Problem Relation Age of Onset   Coronary artery disease Mother    Breast cancer Mother    Colon cancer Mother    Heart disease Maternal Grandmother        MI   Stroke Paternal Grandfather        CVA   Coronary artery disease Paternal Grandfather    Esophageal cancer Neg Hx    Stomach cancer Neg Hx     Rectal cancer Neg Hx      ROS:  Please see the history of present illness.   All other ROS reviewed and negative.     Physical Exam/Data:   Vitals:   06/21/23 1541 06/21/23 1543  BP:  137/76  Pulse:  81  Resp:  (!) 22  Temp:  (!) 97.5 F (36.4 C)  SpO2:  100%  Weight: 91.6 kg    No intake or output data in the 24 hours ending 06/21/23 1702 Filed Weights   06/21/23 1541  Weight: 91.6 kg   Body mass index is 25.25 kg/m.  GENERAL:  Tachypneic  NECK:  No  jugular venous distention, waveform within normal limits, carotid upstroke brisk and symmetric, no bruits, no thyromegaly LYMPHATICS:  No cervical, inguinal adenopathy LUNGS:  Decreased breath sound with upper airway wheezing but no crackles BACK:  No CVA tenderness CHEST:   Unremarkable HEART:  PMI not displaced or sustained,S1 and S2 within normal limits, no S3, no S4, no clicks, no rubs, soft apical systolic murmur early peaking, no diastolic murmurs ABD:  Flat, positive bowel sounds normal in frequency in pitch, no bruits, no rebound, no guarding, no midline pulsatile mass, no hepatomegaly, no splenomegaly EXT:  2 plus pulses throughout, no  edema, no cyanosis no clubbing SKIN:  No rashes no nodules NEURO:   Cranial nerves II through XII grossly intact, motor grossly intact throughout PSYCH:    Cognitively intact, oriented to person place and time   EKG:  The EKG was personally reviewed and demonstrates:  See above.  No acute ST t wave changes Telemetry:  Telemetry was personally reviewed and demonstrates:  NSR  Relevant CV Studies: See below  Laboratory Data:  Chemistry Recent Labs  Lab 06/21/23 1546  NA 140  K 4.5  CL 102  CO2 21*  GLUCOSE 175*  BUN 14  CREATININE 1.29*  CALCIUM 10.1  GFRNONAA 57*  ANIONGAP 17*    Recent Labs  Lab 06/21/23 1546  PROT 7.7  ALBUMIN 4.2  AST 34  ALT 28  ALKPHOS 76  BILITOT 0.8   Hematology Recent Labs  Lab 06/21/23 1546  WBC 11.7*  RBC 4.49  HGB 13.2   HCT 39.5  MCV 88.0  MCH 29.4  MCHC 33.4  RDW 13.3  PLT 186   Cardiac EnzymesNo results for input(s): "TROPONINI" in the last 168 hours. No results for input(s): "TROPIPOC" in the last 168 hours.  BNP Recent Labs  Lab 06/21/23 1550  BNP 59.1    DDimer No results for input(s): "DDIMER" in the last 168 hours.  Radiology/Studies:  DG Chest Portable 1 View Result Date: 06/21/2023 CLINICAL DATA:  Shortness of breath EXAM: PORTABLE CHEST 1 VIEW COMPARISON:  06/09/2023 FINDINGS: Mild cardiomegaly. Aortic atherosclerosis. Mild interstitial opacities within the mid to lower lung fields bilaterally. No pleural effusion or pneumothorax. IMPRESSION: Mild diffuse interstitial opacities bilaterally, which may reflect mild edema versus atypical/viral infection. Electronically Signed   By: Duanne Guess D.O.   On: 06/21/2023 16:38    Assessment and Plan:   Tachypnea:  I have reviewed all objective data and examined the patient as above.  Objective findings that are relative are CXR findings with possible mild edema.  Minimally elevated BNP.  Exam was essentially unremarkable.  I did a bedside echo.  There were very poor acoustic windows but I don't see any pericardial effusion.  No suggestion of LV dysfunction or new acute valve abnormality.  I am not suspecting acute valve dysfunction, endocarditis or acute ischemic event.  No evidence of ACS.  I would give IV Lasix x 1.  Continue to cycle cardiac enzymes and repeat EKG in the AM.  Suggest repeat limited echo in the AM.  Other management per ED.   CAD:  As above, no objective evidence of ischemia.  No anginal symptoms.  EKG is not acute.  He had non obstructive large vessel disease on recent cath.  Repeat enzymes x 1 and repeat EKG in the AM.    For questions or updates, please contact [facility] Please consult [email or web address] for contact info under Cardiology/STEMI.   Signed, Rollene Rotunda, MD  06/21/2023 5:02 PM

## 2023-06-21 NOTE — Hospital Course (Signed)
 Kenneth [name] is a 78 y.o. male with medical history significant for CAD s/p PCI, PAF on Eliquis, HFpEF (60-65%), severe AS s/p TAVR (06/10/2023), ILD on chronic prednisone 5 mg daily, T2DM, HTN, HLD, psoriatic arthritis on adalimumab who is admitted with dyspnea, overnight observation and repeat echocardiogram in the morning per cardiology recommendation.

## 2023-06-21 NOTE — ED Provider Notes (Signed)
 [place] EMERGENCY DEPARTMENT AT [facility] Provider Note   CSN: [id number] Arrival date & time: 06/21/23  1533     History  Chief Complaint  Patient presents with   Shortness of Breath    Kenneth [name] is a 78 y.o. male.   Shortness of Breath Patient presents with shortness of breath.  Has history of interstitial lung disease.  But did have recent TAVR around 10 days ago.  Weight has been doing well but sudden shortness of breath.  No fevers.  No coughing.  No chest pain.  Had been able walk today but then more short of breath while on the couch.    Past Medical History:  Diagnosis Date   Arthritis    CAD S/P percutaneous coronary angioplasty 06/28/2016   Cardiologist- Dr. Herbie [place]: 06/28/16 -- [facility] PCI (Synergy DES 2.75 x 20 mm); 07/10/16: Staged p-m LAD PCI (FFR 0.75) - DES PCI (Synergy DES 3.5 x 20 mm); 10/11/2020: mid-distal LAD 70% (FFR 0.75) DES PCI (Synergy DES 2.5 mm x 32 mm - overlaps prox-mid stent, tapered post-dilation 3.7 - 2.6 mm.;  06/2021 - Cath with severe LAD-D2 bifurcation ISR -- PTCA / kissing balloon - reduced LAD to ~25% & D2 to 40%   Chronic dryness of both eyes    Chronic heart failure with preserved ejection fraction (HFpEF) (HCC) 05/22/2013   Diverticulosis of colon    Epiretinal membrane    "epiretinal attachment" (06/28/2016) 10-14-2017 per pt oringinally in both , now only unilateral    GERD (gastroesophageal reflux disease)    Hiatal hernia    History of adenomatous polyp of colon    History of concussion 08/23/2017   w/o loc--- per pt no residual   History of kidney stones    History of rheumatic fever 1958   NO Evidence of Vavlular disease -> only mild Aortic Sclerosis   History of skin cancer    excision leg;  froze the left arm--- unsure BCC or SCC    Hyperlipidemia    Hypertension    ILD (interstitial lung disease) (HCC)    pulmologist-  dr Vassie Loll--  secondary to methotrexate use --- last PFTs 04-25-2015  mild restriction   Iron  deficiency anemia    Mild obstructive sleep apnea    10-04-2019 per pt  had a sleep study years ago , was told no cpap recommended pt denies    Nephrolithiasis    CT 09-07-2019 bilateral nonobstructive stones   Paroxysmal atrial fibrillation (HCC) 2015   a. 11/2003 Tikosyn initiated - subsequently d/c'd;  b. 2006 RFCA for Afib @ [facility];  c. 09/2011 Echo: EF 60-65%, Gr 2 DD;  d. DCCV 10/2011 , 04/2012, 12/ 2014, & 02/ 2015;  e. RFCA 01-13-2014   Plaque psoriasis 1969   followed by rheumatologist  and dermatologist @ [facility] in [place]   PONV (postoperative nausea and vomiting)    Right ureteral stone    S/P drug eluting coronary stent placement    06-28-2016  x1 to mRCA;  07-10-2016 x1 to mLAD   S/P TAVR (transcatheter aortic valve replacement) 06/10/2023   s/p TAVR with a 29 mm Edwards S3UR via TF approach by Drs Excell Seltzer and Laneta Simmers   Scarring of lung    Seasonal allergies    Short-term memory loss    Type 2 diabetes mellitus (HCC)    followed by pcp   Ureteral calculus, right    Wears hearing aid in both ears     Home Medications  Prior to Admission medications   Medication Sig Start Date End Date Taking? Authorizing Provider  acetaminophen (TYLENOL) 500 MG tablet Take 1,000 mg by mouth every 6 (six) hours as needed for mild pain (pain score 1-3) or moderate pain (pain score 4-6).    [provider]  Adalimumab-bwwd (HADLIMA) 40 MG/0.4ML SOSY Inject 40 mg into the skin every 14 (fourteen) days.    [provider]  amoxicillin (AMOXIL) 500 MG tablet Take 4 tablets (2,000 mg total) by mouth as directed. 1 hour prior to dental work including cleanings 06/16/23   Janetta Hora, PA-C  apixaban (ELIQUIS) 5 MG TABS tablet Take 1 tablet (5 mg total) by mouth 2 (two) times daily. 01/12/21   Fenton, Clint R, PA  cetirizine (ZYRTEC) 10 MG tablet Take 10 mg by mouth daily.    [provider]  Dorzolamide HCl-Timolol Mal PF 2-0.5 % SOLN Place 1 drop into both eyes at  bedtime.    [provider]  empagliflozin (JARDIANCE) 25 MG TABS tablet Take 12.5 mg by mouth daily. 12/26/21   [provider]  ferrous sulfate 325 (65 FE) MG tablet Take 325 mg by mouth every Monday, Wednesday, and Friday.    Ronney Asters, NP  furosemide (LASIX) 20 MG tablet Take 20 mg by mouth See admin instructions. Take 1 tablet (20mg ) twice a week on Monday and Thursday.    [provider]  galantamine (RAZADYNE ER) 24 MG 24 hr capsule Take 24 mg by mouth in the morning. 08/30/20   [provider]  memantine (NAMENDA) 10 MG tablet Take 20 mg by mouth at bedtime. 08/30/20   [provider]  metFORMIN (GLUCOPHAGE) 500 MG tablet Take 500 mg by mouth at bedtime.    [provider]  Multiple Vitamins-Minerals (MULTIVITAMIN GUMMIES MENS) CHEW Chew 1 each by mouth daily.    [provider]  nitroGLYCERIN (NITROSTAT) 0.4 MG SL tablet Place 1 tablet (0.4 mg total) under the tongue every 5 (five) minutes as needed. 07/04/21   Arty Baumgartner, NP  omeprazole (PRILOSEC OTC) 20 MG tablet Take 20 mg by mouth daily as needed (Acid reflux).    [provider]  predniSONE (DELTASONE) 5 MG tablet Take 1 tablet by mouth once daily with breakfast 03/07/23   Oretha Milch, MD  ranolazine (RANEXA) 500 MG 12 hr tablet Take 1 tablet (500 mg total) by mouth 2 (two) times daily. 11/11/22   Marykay Lex, MD  rosuvastatin (CRESTOR) 40 MG tablet Take 1 tablet (40 mg total) by mouth daily. Patient taking differently: Take 40 mg by mouth at bedtime. 12/28/21   Marykay Lex, MD  tamsulosin (FLOMAX) 0.4 MG CAPS capsule Take 1 capsule by mouth once daily Patient taking differently: Take 0.4 mg by mouth daily as needed (kidney stones). 03/13/21   McKenzie, Mardene Celeste, MD      Allergies    Hydrocodone, Other, Oxycontin [oxycodone], Sudafed [pseudoephedrine], Ultram [tramadol], and Lopid [gemfibrozil]    Review of Systems   Review of Systems   Respiratory:  Positive for shortness of breath.     Physical Exam Updated Vital Signs BP 137/76 (BP Location: Left Arm)   Pulse 81   Temp (!) 97.5 F (36.4 C)   Resp (!) 22   Wt 91.6 kg   SpO2 100%   BMI 25.25 kg/m  Physical Exam Vitals and nursing note reviewed.  Cardiovascular:     Rate and Rhythm: Regular rhythm.  Pulmonary:  Effort: Tachypnea present.     Breath sounds: No wheezing or rhonchi.  Chest:     Chest wall: No tenderness.  Abdominal:     Tenderness: There is no abdominal tenderness.  Musculoskeletal:     Right lower leg: No edema.     Left lower leg: No edema.  Skin:    Capillary Refill: Capillary refill takes less than 2 seconds.  Neurological:     Mental Status: He is alert.     ED Results / Procedures / Treatments   Labs (all labs ordered are listed, but only abnormal results are displayed) Labs Reviewed  CBC WITH DIFFERENTIAL/PLATELET - Abnormal; Notable for the following components:      Result Value   WBC 11.7 (*)    Neutro Abs 9.0 (*)    All other components within normal limits  COMPREHENSIVE METABOLIC PANEL - Abnormal; Notable for the following components:   CO2 21 (*)    Glucose, Bld 175 (*)    Creatinine, Ser 1.29 (*)    GFR, Estimated 57 (*)    Anion gap 17 (*)    All other components within normal limits  RESP PANEL BY RT-PCR (RSV, FLU A&B, COVID)  RVPGX2  BRAIN NATRIURETIC PEPTIDE  TROPONIN I (HIGH SENSITIVITY)  TROPONIN I (HIGH SENSITIVITY)    EKG EKG Interpretation Date/Time:  Saturday June 21 2023 15:47:23 EDT Ventricular Rate:  79 PR Interval:  172 QRS Duration:  84 QT Interval:  374 QTC Calculation: 428 R Axis:   73  Text Interpretation: Normal sinus rhythm Normal ECG When compared with ECG of 16-Jun-2023 08:41, No significant change since last tracing Confirmed by Benjiman Core [phone]) on 06/21/2023 4:59:47 PM  Radiology DG Chest Portable 1 View Result Date: 06/21/2023 CLINICAL DATA:  Shortness of breath  EXAM: PORTABLE CHEST 1 VIEW COMPARISON:  06/09/2023 FINDINGS: Mild cardiomegaly. Aortic atherosclerosis. Mild interstitial opacities within the mid to lower lung fields bilaterally. No pleural effusion or pneumothorax. IMPRESSION: Mild diffuse interstitial opacities bilaterally, which may reflect mild edema versus atypical/viral infection. Electronically Signed   By: Duanne Guess D.O.   On: 06/21/2023 16:38    Procedures Procedures    Medications Ordered in ED Medications - No data to display  ED Course/ Medical Decision Making/ A&P                                 Medical Decision Making Amount and/or Complexity of Data Reviewed Labs: ordered. Radiology: ordered.   Patient with acute onset dyspnea.  Differential diagnoses long but includes causes of infection, pneumothorax, valve complications.  Pulmonary medicine felt less likely.  Chest x-ray showed edema versus atypical infection.  Does not sound if he is wheezing.  BNP is normal.  He does not have edema on his legs and reportedly weight is doing good.  Discussed with Dr. Wyline Mood will see patient.  Have reviewed recent discharge note. Dr. Antoine Poche is seen patient.  Did bedside echo but was unable to get great views.  However reassuring from what I could see.  Recommends echo tomorrow.  Does have interstitial lung disease.  Potentially could be a cause of the dyspnea.  With continued dyspnea will admit patient to hospital.  Had turned off oxygen and oxygen saturation remained to 100 but patient felt more dyspneic.  Turned back on under patient's request.  Will admit to hospitalist          Final Clinical  Impression(s) / ED Diagnoses Final diagnoses:  Dyspnea, unspecified type    Rx / DC Orders ED Discharge Orders     None         Benjiman Core, MD 06/21/23 [phone]

## 2023-06-22 ENCOUNTER — Observation Stay (HOSPITAL_BASED_OUTPATIENT_CLINIC_OR_DEPARTMENT_OTHER)

## 2023-06-22 DIAGNOSIS — R0609 Other forms of dyspnea: Secondary | ICD-10-CM

## 2023-06-22 DIAGNOSIS — E119 Type 2 diabetes mellitus without complications: Secondary | ICD-10-CM | POA: Diagnosis not present

## 2023-06-22 DIAGNOSIS — Z9861 Coronary angioplasty status: Secondary | ICD-10-CM | POA: Diagnosis not present

## 2023-06-22 DIAGNOSIS — Z1152 Encounter for screening for COVID-19: Secondary | ICD-10-CM | POA: Diagnosis not present

## 2023-06-22 DIAGNOSIS — Z7901 Long term (current) use of anticoagulants: Secondary | ICD-10-CM | POA: Diagnosis not present

## 2023-06-22 DIAGNOSIS — R0602 Shortness of breath: Secondary | ICD-10-CM | POA: Diagnosis not present

## 2023-06-22 DIAGNOSIS — Z952 Presence of prosthetic heart valve: Secondary | ICD-10-CM

## 2023-06-22 DIAGNOSIS — E785 Hyperlipidemia, unspecified: Secondary | ICD-10-CM | POA: Diagnosis not present

## 2023-06-22 DIAGNOSIS — I5030 Unspecified diastolic (congestive) heart failure: Secondary | ICD-10-CM | POA: Diagnosis not present

## 2023-06-22 DIAGNOSIS — Z85828 Personal history of other malignant neoplasm of skin: Secondary | ICD-10-CM | POA: Diagnosis not present

## 2023-06-22 DIAGNOSIS — Z87891 Personal history of nicotine dependence: Secondary | ICD-10-CM | POA: Diagnosis not present

## 2023-06-22 DIAGNOSIS — I480 Paroxysmal atrial fibrillation: Secondary | ICD-10-CM | POA: Diagnosis not present

## 2023-06-22 DIAGNOSIS — R0600 Dyspnea, unspecified: Secondary | ICD-10-CM | POA: Diagnosis not present

## 2023-06-22 DIAGNOSIS — I2510 Atherosclerotic heart disease of native coronary artery without angina pectoris: Secondary | ICD-10-CM | POA: Diagnosis not present

## 2023-06-22 DIAGNOSIS — R0682 Tachypnea, not elsewhere classified: Secondary | ICD-10-CM | POA: Diagnosis not present

## 2023-06-22 LAB — GLUCOSE, CAPILLARY
Glucose-Capillary: 123 mg/dL — ABNORMAL HIGH (ref 70–99)
Glucose-Capillary: 172 mg/dL — ABNORMAL HIGH (ref 70–99)

## 2023-06-22 LAB — CBC
HCT: 37.8 % — ABNORMAL LOW (ref 39.0–52.0)
Hemoglobin: 13.1 g/dL (ref 13.0–17.0)
MCH: 29.4 pg (ref 26.0–34.0)
MCHC: 34.7 g/dL (ref 30.0–36.0)
MCV: 84.8 fL (ref 80.0–100.0)
Platelets: 177 10*3/uL (ref 150–400)
RBC: 4.46 MIL/uL (ref 4.22–5.81)
RDW: 13.2 % (ref 11.5–15.5)
WBC: 11.2 10*3/uL — ABNORMAL HIGH (ref 4.0–10.5)
nRBC: 0 % (ref 0.0–0.2)

## 2023-06-22 LAB — BASIC METABOLIC PANEL
Anion gap: 12 (ref 5–15)
BUN: 19 mg/dL (ref 8–23)
CO2: 22 mmol/L (ref 22–32)
Calcium: 9.2 mg/dL (ref 8.9–10.3)
Chloride: 106 mmol/L (ref 98–111)
Creatinine, Ser: 1.27 mg/dL — ABNORMAL HIGH (ref 0.61–1.24)
GFR, Estimated: 58 mL/min — ABNORMAL LOW (ref >60)
Glucose, Bld: 112 mg/dL — ABNORMAL HIGH (ref 70–99)
Potassium: 3.6 mmol/L (ref 3.5–5.1)
Sodium: 140 mmol/L (ref 135–145)

## 2023-06-22 LAB — ECHOCARDIOGRAM COMPLETE
AR max vel: 1.51 cm2
AV Area VTI: 1.49 cm2
AV Area mean vel: 1.52 cm2
AV Mean grad: 8 mmHg
AV Peak grad: 14.5 mmHg
Ao pk vel: 1.91 m/s
Area-P 1/2: 2.91 cm2
Height: 75 in
S' Lateral: 2.7 cm
Weight: 3139.35 [oz_av]

## 2023-06-22 LAB — PROCALCITONIN: Procalcitonin: <0.1 ng/mL

## 2023-06-22 MED ORDER — PREDNISONE 5 MG PO TBEC: DELAYED_RELEASE_TABLET | ORAL | 0 refills | Status: AC

## 2023-06-22 MED ORDER — PREDNISONE 5 MG PO TABS: ORAL_TABLET | ORAL | Status: DC

## 2023-06-22 MED ORDER — GUAIFENESIN-DM 100-10 MG/5ML PO SYRP: 5.0000 mL | ORAL_SOLUTION | ORAL | Status: DC | PRN

## 2023-06-22 NOTE — Care Management Obs Status (Signed)
 MEDICARE OBSERVATION STATUS NOTIFICATION   Patient Details  Name: SAHAS SLUKA MRN: [id number] Date of Birth: 1945-05-07   Medicare Observation Status Notification Given:  Yes    Windel Keziah G., RN 06/22/2023, 9:36 AM

## 2023-06-22 NOTE — Progress Notes (Signed)
 Echocardiogram 2D Echocardiogram has been performed.  Lucendia Herrlich 06/22/2023, 3:57 PM

## 2023-06-22 NOTE — Progress Notes (Signed)
 Progress Note  Patient Name: Kenneth [name] Date of Encounter: 06/22/2023  Primary Cardiologist:   Bryan Lemma, MD   Subjective   No chest pain.  No acute SOB.   Inpatient Medications    Scheduled Meds:  apixaban  5 mg Oral BID   feeding supplement  237 mL Oral BID BM   galantamine  24 mg Oral q AM   insulin aspart  0-5 Units Subcutaneous QHS   insulin aspart  0-9 Units Subcutaneous TID WC   memantine  20 mg Oral QHS   predniSONE  5 mg Oral Q breakfast   ranolazine  500 mg Oral BID   rosuvastatin  40 mg Oral QHS   sodium chloride flush  3 mL Intravenous Q12H   Continuous Infusions:  PRN Meds: acetaminophen **OR** acetaminophen, alum & mag hydroxide-simeth, guaiFENesin-dextromethorphan, hydrOXYzine, ondansetron **OR** ondansetron (ZOFRAN) IV, mouth rinse, senna-docusate   Vital Signs    Vitals:   06/21/23 2300 06/22/23 0400 06/22/23 0500 06/22/23 0831  BP: 138/84 133/85  128/78  Pulse:    73  Resp: 20   18  Temp: 97.9 F (36.6 C) 97.9 F (36.6 C)  (!) 97.5 F (36.4 C)  TempSrc: Oral Oral  Oral  SpO2:    99%  Weight:   89 kg   Height:        Intake/Output Summary (Last 24 hours) at 06/22/2023 1206 Last data filed at 06/22/2023 0200 Gross per 24 hour  Intake --  Output 800 ml  Net -800 ml   Filed Weights   06/21/23 1541 06/21/23 1954 06/22/23 0500  Weight: 91.6 kg 92.6 kg 89 kg    Telemetry    NSR, no acute ST T wave changes - Personally Reviewed  ECG    NSR, rate 65, axis WNL, intervals WNL,  poor anterior R wave progression.  No acute ST T wave changes.  - Personally Reviewed  Physical Exam   GEN: No acute distress.   Neck: No  JVD Cardiac: RRR, no murmurs, rubs, or gallops.  Respiratory: Clear  to auscultation bilaterally. GI: Soft, nontender, non-distended  MS: No  edema; No deformity. Neuro:  Nonfocal  Psych: Normal affect   Labs    Chemistry Recent Labs  Lab 06/21/23 1546 06/22/23 0413  NA 140 140  K 4.5 3.6  CL 102 106   CO2 21* 22  GLUCOSE 175* 112*  BUN 14 19  CREATININE 1.29* 1.27*  CALCIUM 10.1 9.2  PROT 7.7  --   ALBUMIN 4.2  --   AST 34  --   ALT 28  --   ALKPHOS 76  --   BILITOT 0.8  --   GFRNONAA 57* 58*  ANIONGAP 17* 12     Hematology Recent Labs  Lab 06/21/23 1546 06/22/23 0413  WBC 11.7* 11.2*  RBC 4.49 4.46  HGB 13.2 13.1  HCT 39.5 37.8*  MCV 88.0 84.8  MCH 29.4 29.4  MCHC 33.4 34.7  RDW 13.3 13.2  PLT 186 177    Cardiac EnzymesNo results for input(s): "TROPONINI" in the last 168 hours. No results for input(s): "TROPIPOC" in the last 168 hours.   BNP Recent Labs  Lab 06/21/23 1550  BNP 59.1     DDimer No results for input(s): "DDIMER" in the last 168 hours.   Radiology    DG Chest Portable 1 View Result Date: 06/21/2023 CLINICAL DATA:  Shortness of breath EXAM: PORTABLE CHEST 1 VIEW COMPARISON:  06/09/2023 FINDINGS: Mild cardiomegaly.  Aortic atherosclerosis. Mild interstitial opacities within the mid to lower lung fields bilaterally. No pleural effusion or pneumothorax. IMPRESSION: Mild diffuse interstitial opacities bilaterally, which may reflect mild edema versus atypical/viral infection. Electronically Signed   By: Duanne Guess D.O.   On: 06/21/2023 16:38    Cardiac Studies   Echo:  Pending.   Patient Profile     78 y.o. male with a hx of recent TAVR who is was seen yesterday for the evaluation of acute shortness of breath at the request of Dr. Rubin Payor.    Assessment & Plan    Dyspnea:   Limited echo is pending but I suspect this will look OK.  Bedside echo was not adequate but no objective findings since admission.  I had a long discussion with the patient and his wife.  This could have been panic.  I saw in his notes and discussed with him that he has had a history of PTSD and nightmares.  If echo OK no further work up or change in therapy or follow up plans.   For questions or updates, please contact [facility] Please consult [email or web address]  for contact info under Cardiology/STEMI.   Signed, Rollene Rotunda, MD  06/22/2023, 12:06 PM

## 2023-06-22 NOTE — Plan of Care (Signed)

## 2023-06-22 NOTE — Plan of Care (Signed)
  Problem: Health Behavior/Discharge Planning: Goal: Ability to identify and utilize available resources and services will improve Outcome: Progressing   Problem: Health Behavior/Discharge Planning: Goal: Ability to manage health-related needs will improve Outcome: Progressing

## 2023-06-22 NOTE — Discharge Summary (Signed)
 Physician Discharge Summary  Kenneth [name] ZOX:[id number] DOB: 04-Jul-1945 DOA: 06/21/2023  PCP: [facility]  Admit date: 06/21/2023 Discharge date: 06/22/2023 30 Day Unplanned Readmission Risk Score    Flowsheet Row Admission (Discharged) from 06/04/2023 in [facility] 4E CV SURGICAL PROGRESSIVE CARE  30 Day Unplanned Readmission Risk Score (%) 14.78 Filed at 06/11/2023 0801       This score is the patient's risk of an unplanned readmission within 30 days of being discharged (0 -100%). The score is based on dignosis, age, lab data, medications, orders, and past utilization.   Low:  0-14.9   Medium: 15-21.9   High: 22-29.9   Extreme: 30 and above          Admitted From: Home Disposition: Home  Recommendations for Outpatient Follow-up:  Follow up with PCP in 1-2 weeks Please obtain BMP/CBC in one week Follow-up with your pulmonologist in 1 to 2 weeks Please follow up with your PCP on the following pending results: Unresulted Labs (From admission, onward)    None         Home Health: None Equipment/Devices: None  Discharge Condition: Stable CODE STATUS: Full code Diet recommendation: Cardiac  HPI: Kenneth [name] is a 78 y.o. male with medical history significant for CAD s/p PCI, PAF on Eliquis, HFpEF (60-65%), severe AS s/p TAVR (06/10/2023), ILD on chronic prednisone 5 mg daily, T2DM, HTN, HLD, psoriatic arthritis on adalimumab who presented to the ED for evaluation of dyspnea.   Patient underwent TAVR on 06/10/2023.  He did well postoperatively.  He was seen in cardiology clinic on 3/10 for follow-up.  At that time he continued to feel well without any recurrent dyspnea, orthopnea, peripheral edema, or fatigue.   Patient states that over the last few days he has been having increased shortness of breath with and without activity.  He has felt himself breathing rapidly today and has had to take deep long inspirations to try to slow his respiratory rate.  He has had a  very infrequent nonproductive cough.  He reports intermittent sharp stabbing nonradiating left lower chest pain that will resolve on its own after a few minutes.  He has not noted any new swelling in his extremities.  He does report some indigestion without nausea or vomiting.  Denies abdominal pain, dysuria.   ED Course  Labs/Imaging on admission: I have personally reviewed following labs and imaging studies.   Initial vitals showed BP 137/76, pulse 81, RR 22, temp 97.5 F, SpO2 100% on room air.  Patient placed on supplemental oxygen via [place] for comfort.   Labs showed sodium 140, potassium 4.5, bicarb 21, BUN 14, creatinine 1.29, serum glucose 175, LFTs within normal limits, WBC 11.7, hemoglobin 13.2, platelets 186,000, BNP 59.1, troponin 8.   SARS-CoV-2, influenza, RSV PCR negative.   Portable chest x-ray showed mild diffuse interstitial opacities bilaterally.   Cardiology were consulted and have evaluated patient.  Bedside ultrasound performed, poor acoustic windows noted but no pericardial effusion, LV dysfunction, or new acute valve abnormality appreciated.  No recommended medical admission and obtaining formal echocardiogram in a.m.  The hospitalist service was consulted to admit.  Subjective: Seen and examined this morning, wife and daughter at the bedside.  Patient was feeling well without any shortness of breath.  Plan of care discussed with the whole family.  Brief/Interim Summary: Patient was basically admitted due to dyspnea.  Dyspnea in a patient with history of chronic diastolic congestive heart failure and recent history of TAVR on  06/10/2023 due to severe AAS as well as interstitial lung disease: Patient was doing fine after the TAVR but started having shortness of breath just a few days ago.  No other complaint.  No cough, fever chills or sweating.  Imaging studies as above.  He received 1 dose of Lasix in the ED.  Tested negative for all respiratory infections.  Bedside echo looked  normal to cardiology.  Patient never was hypoxic.  He was placed on oxygen/2 L only because patient asked for comfort.  This morning he was feeling much better.  Reassessed by cardiology.  Transthoracic echo was obtained which did not show any valvular issues.  Cardiology cleared him for discharge.  Lungs were clear to auscultation.  He did not appear to have any lung issues or any problems with the known fibrosis either.  However the wife was anxious to take him home and requested that I reach out to patient's primary pulmonologist Dr. Vassie Loll to see if he can see the patient.  Dr. Vassie Loll was working at [employer] however I discussed the case with him and he eventually called patient and discussed with the family and reassured them as well.  Family then agreeable to discharge.  Dr. Vassie Loll recommended discharging on tapering dose of prednisone starting with 20 mg and then bring it down to his home med of 5 mg p.o. daily in few days.  See below for that.  Patient is being discharged in stable condition.  Of note, he does appear to have mild renal insufficiency but does not meet criteria for AKI.  Discharge plan was discussed with patient and/or family member and they verbalized understanding and agreed with it.  Discharge Diagnoses:  Principal Problem:   Dyspnea Active Problems:   Chronic heart failure with preserved ejection fraction (HFpEF) (HCC)   S/P TAVR (transcatheter aortic valve replacement)   ILD (interstitial lung disease) (HCC)   Paroxysmal atrial fibrillation (HCC)   CAD S/P percutaneous coronary angioplasty   Hyperlipidemia associated with type 2 diabetes mellitus (HCC)   Controlled type 2 diabetes mellitus without complication, without long-term current use of insulin (HCC)   Hypertension associated with diabetes (HCC)    Discharge Instructions   Allergies as of 06/22/2023       Reactions   Hydrocodone Shortness Of Breath   In combination with decongestants   Other Palpitations, Other  (See Comments)   Reaction to all decongestants - palpitations, irregular heart rhythm   Oxycontin [oxycodone] Shortness Of Breath   Sudafed [pseudoephedrine] Palpitations   Ultram [tramadol] Shortness Of Breath, Rash, Other (See Comments)   Flushing    Lopid [gemfibrozil] Other (See Comments)   Dizziness        Medication List     TAKE these medications    acetaminophen 500 MG tablet Commonly known as: TYLENOL Take 1,000 mg by mouth every 6 (six) hours as needed for mild pain (pain score 1-3) or moderate pain (pain score 4-6).   amoxicillin 500 MG tablet Commonly known as: AMOXIL Take 4 tablets (2,000 mg total) by mouth as directed. 1 hour prior to dental work including cleanings What changed:  when to take this reasons to take this additional instructions   apixaban 5 MG Tabs tablet Commonly known as: Eliquis Take 1 tablet (5 mg total) by mouth 2 (two) times daily.   cetirizine 10 MG tablet Commonly known as: ZYRTEC Take 10 mg by mouth daily.   Dorzolamide HCl-Timolol Mal PF 2-0.5 % Soln Place 1 drop into  both eyes at bedtime.   empagliflozin 25 MG Tabs tablet Commonly known as: JARDIANCE Take 12.5 mg by mouth daily.   ferrous sulfate 325 (65 FE) MG tablet Take 325 mg by mouth every Monday, Wednesday, and Friday.   furosemide 20 MG tablet Commonly known as: LASIX Take 20 mg by mouth See admin instructions. Take 1 tablet (20mg ) twice a week on Monday and Thursday.   galantamine 24 MG 24 hr capsule Commonly known as: RAZADYNE ER Take 24 mg by mouth in the morning.   Hadlima 40 MG/0.4ML Sosy Generic drug: Adalimumab-bwwd Inject 40 mg into the skin every 14 (fourteen) days.   memantine 10 MG tablet Commonly known as: NAMENDA Take 20 mg by mouth at bedtime.   metFORMIN 500 MG tablet Commonly known as: GLUCOPHAGE Take 500 mg by mouth at bedtime.   Multivitamin Gummies Mens Chew Chew 1 each by mouth daily.   nitroGLYCERIN 0.4 MG SL tablet Commonly  known as: Nitrostat Place 1 tablet (0.4 mg total) under the tongue every 5 (five) minutes as needed. What changed: reasons to take this   omeprazole 20 MG tablet Commonly known as: PRILOSEC OTC Take 20 mg by mouth daily as needed (Acid reflux).   predniSONE 5 MG Tbec Take 20 mg by mouth daily for 3 days, THEN 15 mg daily for 3 days, THEN 10 mg daily for 3 days. Start taking on: June 22, 2023 What changed: You were already taking a medication with the same name, and this prescription was added. Make sure you understand how and when to take each.   predniSONE 5 MG tablet Commonly known as: DELTASONE Take 1 tablet by mouth once daily with breakfast Start taking on: July 01, 2023 What changed:  how much to take how to take this when to take this additional instructions These instructions start on July 01, 2023. If you are unsure what to do until then, ask your doctor or other care provider.   ranolazine 500 MG 12 hr tablet Commonly known as: RANEXA Take 1 tablet (500 mg total) by mouth 2 (two) times daily.   rosuvastatin 40 MG tablet Commonly known as: CRESTOR Take 1 tablet (40 mg total) by mouth daily. What changed: when to take this   tamsulosin 0.4 MG Caps capsule Commonly known as: FLOMAX Take 1 capsule by mouth once daily What changed:  when to take this reasons to take this        Follow-up Information     [facility] Follow up in 1 week(s).   Contact information: [address] [facility] [place] [address] [phone]                Allergies  Allergen Reactions   Hydrocodone Shortness Of Breath    In combination with decongestants   Other Palpitations and Other (See Comments)    Reaction to all decongestants - palpitations, irregular heart rhythm   Oxycontin [Oxycodone] Shortness Of Breath   Sudafed [Pseudoephedrine] Palpitations   Ultram [Tramadol] Shortness Of Breath, Rash and Other (See Comments)    Flushing     Lopid [Gemfibrozil] Other (See Comments)    Dizziness      Consultations: Cardiology   Procedures/Studies: ECHOCARDIOGRAM COMPLETE Result Date: 06/22/2023    ECHOCARDIOGRAM REPORT   Patient Name:   Kenneth [name] Date of Exam: 06/22/2023 Medical Rec #:  [id number]       Height:       75.0 in Accession #:    [id number]  Weight:       196.2 lb Date of Birth:  1945/11/30       BSA:          2.177 m Patient Age:    77 years        BP:           133/85 mmHg Patient Gender: M               HR:           66 bpm. Exam Location:  Inpatient Procedure: 2D Echo, Cardiac Doppler and Color Doppler (Both Spectral and Color            Flow Doppler were utilized during procedure). Indications:    Dyspnea R06.00, Post TAVR evaluation Z95.2  History:        Patient has prior history of Echocardiogram examinations, most                 recent 06/11/2023. CHF, CAD, Arrythmias:Atrial Fibrillation,                 Atrial Flutter and Tachycardia, Signs/Symptoms:Dyspnea and                 Shortness of Breath; Risk Factors:Hypertension, Sleep Apnea and                 Diabetes.                 Aortic Valve: 29 mm valve is present in the aortic position.  Sonographer:    Lucendia Herrlich RCS Referring Phys: [id number] VISHAL R PATEL IMPRESSIONS  1. Left ventricular ejection fraction, by estimation, is 60 to 65%. The left ventricle has normal function. The left ventricle has no regional wall motion abnormalities. There is mild concentric left ventricular hypertrophy. Left ventricular diastolic function could not be evaluated.  2. Right ventricular systolic function is normal. The right ventricular size is normal.  3. Left atrial size was mildly dilated.  4. A small pericardial effusion is present. The pericardial effusion is circumferential.  5. The mitral valve is normal in structure. Mild mitral valve regurgitation. No evidence of mitral stenosis. Moderate mitral annular calcification.  6. The aortic valve has been  repaired/replaced. Aortic valve regurgitation is not visualized. No aortic stenosis is present. There is a 29 mm valve present in the aortic position.  7. There is mild dilatation of the ascending aorta, measuring 41 mm.  8. The inferior vena cava is normal in size with greater than 50% respiratory variability, suggesting right atrial pressure of 3 mmHg. FINDINGS  Left Ventricle: Left ventricular ejection fraction, by estimation, is 60 to 65%. The left ventricle has normal function. The left ventricle has no regional wall motion abnormalities. The left ventricular internal cavity size was normal in size. There is  mild concentric left ventricular hypertrophy. Left ventricular diastolic function could not be evaluated due to mitral annular calcification (moderate or greater). Left ventricular diastolic function could not be evaluated. Right Ventricle: The right ventricular size is normal. No increase in right ventricular wall thickness. Right ventricular systolic function is normal. Left Atrium: Left atrial size was mildly dilated. Right Atrium: Right atrial size was normal in size. Pericardium: A small pericardial effusion is present. The pericardial effusion is circumferential. Presence of epicardial fat layer. Mitral Valve: The mitral valve is normal in structure. Moderate mitral annular calcification. Mild mitral valve regurgitation. No evidence of mitral valve stenosis. Tricuspid Valve: The tricuspid valve is normal in structure.  Tricuspid valve regurgitation is mild . No evidence of tricuspid stenosis. Aortic Valve: The aortic valve has been repaired/replaced. Aortic valve regurgitation is not visualized. No aortic stenosis is present. Aortic valve mean gradient measures 8.0 mmHg. Aortic valve peak gradient measures 14.5 mmHg. Aortic valve area, by VTI  measures 1.49 cm. There is a 29 mm valve present in the aortic position. Pulmonic Valve: The pulmonic valve was normal in structure. Pulmonic valve  regurgitation is not visualized. No evidence of pulmonic stenosis. Aorta: There is mild dilatation of the ascending aorta, measuring 41 mm. Venous: The inferior vena cava is normal in size with greater than 50% respiratory variability, suggesting right atrial pressure of 3 mmHg. IAS/Shunts: No atrial level shunt detected by color flow Doppler.  LEFT VENTRICLE PLAX 2D LVIDd:         4.20 cm   Diastology LVIDs:         2.70 cm   LV e' medial:    8.48 cm/s LV PW:         1.10 cm   LV E/e' medial:  13.7 LV IVS:        1.20 cm   LV e' lateral:   7.35 cm/s LVOT diam:     2.00 cm   LV E/e' lateral: 15.8 LV SV:         56 LV SV Index:   26 LVOT Area:     3.14 cm  RIGHT VENTRICLE RV S prime:     14.40 cm/s TAPSE (M-mode): 1.9 cm LEFT ATRIUM             Index        RIGHT ATRIUM           Index LA diam:        5.40 cm 2.48 cm/m   RA Area:     18.00 cm LA Vol (A2C):   70.2 ml 32.25 ml/m  RA Volume:   44.40 ml  20.40 ml/m LA Vol (A4C):   78.6 ml 36.11 ml/m LA Biplane Vol: 74.6 ml 34.27 ml/m  AORTIC VALVE AV Area (Vmax):    1.51 cm AV Area (Vmean):   1.52 cm AV Area (VTI):     1.49 cm AV Vmax:           190.50 cm/s AV Vmean:          130.000 cm/s AV VTI:            0.377 m AV Peak Grad:      14.5 mmHg AV Mean Grad:      8.0 mmHg LVOT Vmax:         91.50 cm/s LVOT Vmean:        62.700 cm/s LVOT VTI:          0.178 m LVOT/AV VTI ratio: 0.47  AORTA Ao Root diam: 2.70 cm Ao Asc diam:  4.10 cm MITRAL VALVE                TRICUSPID VALVE MV Area (PHT): 2.91 cm     TR Peak grad:   27.9 mmHg MV Decel Time: 261 msec     TR Vmax:        264.00 cm/s MV E velocity: 116.00 cm/s MV A velocity: 70.20 cm/s   SHUNTS MV E/A ratio:  1.65         Systemic VTI:  0.18 m  Systemic Diam: 2.00 cm Lavona Mound Tobb DO Electronically signed by Thomasene Ripple DO Signature Date/Time: 06/22/2023/2:56:34 PM    Final    DG Chest Portable 1 View Result Date: 06/21/2023 CLINICAL DATA:  Shortness of breath EXAM: PORTABLE CHEST 1  VIEW COMPARISON:  06/09/2023 FINDINGS: Mild cardiomegaly. Aortic atherosclerosis. Mild interstitial opacities within the mid to lower lung fields bilaterally. No pleural effusion or pneumothorax. IMPRESSION: Mild diffuse interstitial opacities bilaterally, which may reflect mild edema versus atypical/viral infection. Electronically Signed   By: Duanne Guess D.O.   On: 06/21/2023 16:38   ECHOCARDIOGRAM COMPLETE Result Date: 06/11/2023    ECHOCARDIOGRAM REPORT   Patient Name:   Kenneth [name] Date of Exam: 06/11/2023 Medical Rec #:  [id number]       Height:       75.0 in Accession #:    [id number]      Weight:       200.0 lb Date of Birth:  [birth year]       BSA:          2.194 m Patient Age:    77 years        BP:           129/62 mmHg Patient Gender: M               HR:           72 bpm. Exam Location:  Inpatient Procedure: 2D Echo, Cardiac Doppler and Color Doppler (Both Spectral and Color            Flow Doppler were utilized during procedure). Indications:    Post TAVR eval V43.3 / Z95.2  History:        Patient has prior history of Echocardiogram examinations, most                 recent 06/10/2023. AVR 29mm Edwards S3UR.  Sonographer:    Harriette Bouillon RDCS Referring Phys: [id number] KATHRYN R THOMPSON IMPRESSIONS  1. Left ventricular ejection fraction, by estimation, is 60 to 65%. The left ventricle has normal function. The left ventricle has no regional wall motion abnormalities. There is moderate left ventricular hypertrophy. Left ventricular diastolic parameters were normal.  2. Right ventricular systolic function is normal. The right ventricular size is normal.  3. Left atrial size was moderately dilated.  4. The mitral valve is abnormal. Trivial mitral valve regurgitation. No evidence of mitral stenosis. Moderate mitral annular calcification.  5. Post TAVR with 29 mm Sapien 3 valve no PVL normal gradients mean 6 peak 10.8 mmHg . The aortic valve has been repaired/replaced. Aortic valve regurgitation is not  visualized. No aortic stenosis is present.  6. Aortic dilatation noted. There is mild dilatation of the ascending aorta, measuring 39 mm.  7. The inferior vena cava is normal in size with greater than 50% respiratory variability, suggesting right atrial pressure of 3 mmHg. FINDINGS  Left Ventricle: Left ventricular ejection fraction, by estimation, is 60 to 65%. The left ventricle has normal function. The left ventricle has no regional wall motion abnormalities. Strain was performed and the global longitudinal strain is indeterminate. The left ventricular internal cavity size was normal in size. There is moderate left ventricular hypertrophy. Left ventricular diastolic parameters were normal. Right Ventricle: The right ventricular size is normal. No increase in right ventricular wall thickness. Right ventricular systolic function is normal. Left Atrium: Left atrial size was moderately dilated. Right Atrium: Right atrial size was normal in size. Pericardium: There is no evidence  of pericardial effusion. Mitral Valve: The mitral valve is abnormal. There is moderate thickening of the mitral valve leaflet(s). There is moderate calcification of the mitral valve leaflet(s). Moderate mitral annular calcification. Trivial mitral valve regurgitation. No evidence of mitral valve stenosis. Tricuspid Valve: The tricuspid valve is normal in structure. Tricuspid valve regurgitation is mild . No evidence of tricuspid stenosis. Aortic Valve: Post TAVR with 29 mm Sapien 3 valve no PVL normal gradients mean 6 peak 10.8 mmHg. The aortic valve has been repaired/replaced. Aortic valve regurgitation is not visualized. No aortic stenosis is present. Aortic valve mean gradient measures  6.0 mmHg. Aortic valve peak gradient measures 10.8 mmHg. Aortic valve area, by VTI measures 2.27 cm. Pulmonic Valve: The pulmonic valve was normal in structure. Pulmonic valve regurgitation is not visualized. No evidence of pulmonic stenosis. Aorta: Aortic  dilatation noted. There is mild dilatation of the ascending aorta, measuring 39 mm. Venous: The inferior vena cava is normal in size with greater than 50% respiratory variability, suggesting right atrial pressure of 3 mmHg. IAS/Shunts: No atrial level shunt detected by color flow Doppler. Additional Comments: 3D was performed not requiring image post processing on an independent workstation and was indeterminate.  LEFT VENTRICLE PLAX 2D LVIDd:         4.90 cm   Diastology LVIDs:         2.90 cm   LV e' medial:    4.68 cm/s LV PW:         1.40 cm   LV E/e' medial:  21.2 LV IVS:        1.40 cm   LV e' lateral:   5.87 cm/s LVOT diam:     2.30 cm   LV E/e' lateral: 16.9 LV SV:         73 LV SV Index:   33 LVOT Area:     4.15 cm  RIGHT VENTRICLE            IVC RV S prime:     8.05 cm/s  IVC diam: 2.00 cm TAPSE (M-mode): 1.6 cm LEFT ATRIUM             Index        RIGHT ATRIUM           Index LA diam:        4.70 cm 2.14 cm/m   RA Area:     15.60 cm LA Vol (A2C):   58.9 ml 26.84 ml/m  RA Volume:   34.10 ml  15.54 ml/m LA Vol (A4C):   57.7 ml 26.29 ml/m LA Biplane Vol: 61.2 ml 27.89 ml/m  AORTIC VALVE AV Area (Vmax):    2.58 cm AV Area (Vmean):   2.67 cm AV Area (VTI):     2.27 cm AV Vmax:           164.00 cm/s AV Vmean:          111.000 cm/s AV VTI:            0.320 m AV Peak Grad:      10.8 mmHg AV Mean Grad:      6.0 mmHg LVOT Vmax:         102.00 cm/s LVOT Vmean:        71.200 cm/s LVOT VTI:          0.175 m LVOT/AV VTI ratio: 0.55  AORTA Ao Root diam: 2.90 cm Ao Asc diam:  3.90 cm MITRAL VALVE MV Area (PHT): 1.91 cm  SHUNTS MV Decel Time: 398 msec    Systemic VTI:  0.18 m MV E velocity: 99.00 cm/s  Systemic Diam: 2.30 cm MV A velocity: 76.00 cm/s MV E/A ratio:  1.30 Charlton Haws MD Electronically signed by Charlton Haws MD Signature Date/Time: 06/11/2023/11:05:00 AM    Final    ECHOCARDIOGRAM LIMITED Result Date: 06/10/2023    ECHOCARDIOGRAM LIMITED REPORT   Patient Name:   Kenneth [name] Date of Exam:  06/10/2023 Medical Rec #:  [id number]       Height:       75.0 in Accession #:    [id number]      Weight:       196.2 lb Date of Birth:  02/25/1946       BSA:          2.177 m Patient Age:    77 years        BP:           117/67 mmHg Patient Gender: M               HR:           65 bpm. Exam Location:  Inpatient Procedure: Limited Echo (Both Spectral and Color Flow Doppler were utilized            during procedure). Indications:     severe aortic stenosis. TAVR.  History:         Patient has prior history of Echocardiogram examinations, most                  recent 05/14/2023. CAD, Arrythmias:LBBB; Risk Factors:Diabetes,                  Hypertension, Dyslipidemia and Sleep Apnea.                  Aortic Valve: 29 mm Edwards Sapien prosthetic, stented (TAVR)                  valve is present in the aortic position. Procedure Date:                  06/10/2023.  Sonographer:     Delcie Roch RDCS Referring Phys:  [phone] MICHAEL COOPER Diagnosing Phys: Lennie Odor MD IMPRESSIONS  1. TTE guided TAVR. 29 mm S3. Trivial PVL. Normal prosthetic valve function. There is a 29 mm Edwards Sapien prosthetic (TAVR) valve present in the aortic position. Procedure Date: 06/10/2023. Echo findings are consistent with normal structure and function of the aortic valve prosthesis. Aortic valve area, by VTI measures 2.87 cm. Aortic valve mean gradient measures 5.0 mmHg. Aortic valve Vmax measures 1.51 m/s.  2. Left ventricular ejection fraction, by estimation, is 60 to 65%. The left ventricle has normal function. Left ventricular diastolic parameters are consistent with Grade II diastolic dysfunction (pseudonormalization).  3. Right ventricular systolic function is normal. The right ventricular size is normal.  4. The mitral valve is degenerative. Moderate mitral annular calcification. FINDINGS  Left Ventricle: Left ventricular ejection fraction, by estimation, is 60 to 65%. The left ventricle has normal function. Left ventricular diastolic  parameters are consistent with Grade II diastolic dysfunction (pseudonormalization). Right Ventricle: The right ventricular size is normal. No increase in right ventricular wall thickness. Right ventricular systolic function is normal. Pericardium: There is no evidence of pericardial effusion. Mitral Valve: The mitral valve is degenerative in appearance. Moderate mitral annular calcification. Tricuspid Valve: The tricuspid valve is grossly normal. Tricuspid valve regurgitation is mild .  No evidence of tricuspid stenosis. Aortic Valve: TTE guided TAVR. 29 mm S3. Trivial PVL. Normal prosthetic valve function. Aortic valve mean gradient measures 5.0 mmHg. Aortic valve peak gradient measures 9.1 mmHg. Aortic valve area, by VTI measures 2.87 cm. There is a 29 mm Edwards Sapien prosthetic, stented (TAVR) valve present in the aortic position. Procedure Date: 06/10/2023. Echo findings are consistent with normal structure and function of the aortic valve prosthesis. Pulmonic Valve: The pulmonic valve was grossly normal. Pulmonic valve regurgitation is not visualized. No evidence of pulmonic stenosis. Aorta: The aortic root and ascending aorta are structurally normal, with no evidence of dilitation. Additional Comments: Spectral Doppler performed. Color Doppler performed.  LEFT VENTRICLE PLAX 2D LVIDd:         4.10 cm LVIDs:         2.40 cm LV PW:         1.20 cm LV IVS:        1.70 cm LVOT diam:     2.50 cm LV SV:         97 LV SV Index:   45 LVOT Area:     4.91 cm  AORTIC VALVE AV Area (Vmax):    2.63 cm AV Area (Vmean):   1.52 cm AV Area (VTI):     2.87 cm AV Vmax:           151.00 cm/s AV Vmean:          181.500 cm/s AV VTI:            0.338 m AV Peak Grad:      9.1 mmHg AV Mean Grad:      5.0 mmHg LVOT Vmax:         80.95 cm/s LVOT Vmean:        56.350 cm/s LVOT VTI:          0.198 m LVOT/AV VTI ratio: 0.58  AORTA Ao Asc diam: 3.90 cm MITRAL VALVE                TRICUSPID VALVE MV Area (PHT): 2.29 cm     TR Peak  grad:   23.8 mmHg MV Decel Time: 332 msec     TR Vmax:        244.00 cm/s MV E velocity: 106.00 cm/s MV A velocity: 63.40 cm/s   SHUNTS MV E/A ratio:  1.67         Systemic VTI:  0.20 m                             Systemic Diam: 2.50 cm Lennie Odor MD Electronically signed by Lennie Odor MD Signature Date/Time: 06/10/2023/4:53:42 PM    Final    Structural Heart Procedure Result Date: 06/10/2023 See surgical note for result.  DG Chest 2 View Result Date: 06/09/2023 CLINICAL DATA:  Preop exam.  Transcatheter aortic valve replacement. EXAM: CHEST - 2 VIEW COMPARISON:  Radiograph 06/04/2023, CT 06/03/2023 FINDINGS: Low lung volumes persist. Upper normal heart size. Coronary stent visualized. Stable mediastinal contours. No pulmonary edema. No focal airspace disease, large pleural effusion or pneumothorax. Minimal ill-defined subpleural reticulation in the right upper lobe, better characterized on recent chest CT. IMPRESSION: Low lung volumes without acute findings. Electronically Signed   By: Narda Rutherford M.D.   On: 06/09/2023 15:53   DG CHEST PORT 1 VIEW Result Date: 06/05/2023 CLINICAL DATA:  Shortness of breath EXAM: PORTABLE CHEST 1 VIEW COMPARISON:  06/02/2023 FINDINGS: Cardiac shadow is stable. Lungs are hypoinflated but clear. No bony abnormality is noted. IMPRESSION: No active disease. Electronically Signed   By: Alcide Clever M.D.   On: 06/05/2023 00:43   CT CORONARY MORPH W/CTA COR W/SCORE W/CA W/CM &/OR WO/CM Addendum Date: 06/03/2023 ADDENDUM REPORT: 06/03/2023 08:53 EXAM: OVER-READ INTERPRETATION  CT CHEST The following report is an over-read performed by radiologist Dr. Donetta Potts [facility] on 06/03/2023. This over-read does not include interpretation of cardiac or coronary anatomy or pathology. The coronary calcium score interpretation by the cardiologist is attached. COMPARISON:  06/03/2023. FINDINGS: The heart is enlarged and multi-vessel coronary artery calcifications  are noted. There is atherosclerotic calcification of the aorta with aneurysmal dilatation of the ascending aorta measuring 4.0 cm. The pulmonary trunk is normal in caliber. No mediastinal, hilar, or axillary lymphadenopathy is seen. The trachea and esophagus are within normal limits. There is a small hiatal hernia. Stable fibrotic changes are noted in the lungs with ground-glass attenuation and subpleural fibrosis. No effusion or pneumothorax is seen. Emphysematous changes are noted in the lungs. No significant pulmonary nodule or mass is seen. No acute abnormality in the upper abdomen. Degenerative changes are noted in the thoracic spine. No acute osseous abnormality is seen. IMPRESSION: 1. Aortic atherosclerosis with aneurysmal dilatation of the ascending aorta measuring 4.0 cm. 2. Coronary artery calcifications. 3. Emphysema and stable interstitial lung disease. Electronically Signed   By: Thornell Sartorius M.D.   On: 06/03/2023 08:53   Result Date: 06/03/2023 CLINICAL DATA:  Aortic valve replacement (TAVR), pre-op eval EXAM: Cardiac TAVR CT TECHNIQUE: The patient was scanned on a Siemens Force 192 slice scanner. A 120 kV retrospective scan was triggered in the descending thoracic aorta at 111 HU's. Gantry rotation speed was 270 msecs and collimation was .9 mm. The 3D data set was reconstructed in 5% intervals of the R-R cycle. Systolic and diastolic phases were analyzed on a dedicated work station using MPR, MIP and VRT modes. The patient received OMNIPAQUE IOHEXOL 350 MG/ML SOLN of contrast. FINDINGS: Aortic Valve: Tricuspid aortic valve with severely reduced cusp excursion. Severely thickened and severely calcified aortic valve cusps. AV calcium score: 3744 Virtual Basal Annulus Measurements: Maximum/Minimum Diameter: 31.5 x 24.7 mm Perimeter: 86.8 mm Area:  574 mm2 No significant LVOT calcifications. Membranous septal length: 9 mm Based on these measurements, the annulus would be suitable for a 29 mm  Sapien 3 valve. Alternatively, Heart Team can consider 34 mm Evolut valve. Recommend Heart Team discussion for valve selection. Sinus of Valsalva Measurements: Non-coronary:  39 mm Right - coronary:  39 mm Left - coronary:  41 mm Sinus of Valsalva Height: Left: 25 mm Right: 24 mm Aorta: Conventional 3 vessel branch pattern of aortic arch. Sinotubular Junction:  35 mm Ascending Thoracic Aorta:  40 mm Aortic Arch:  33 mm Descending Thoracic Aorta:  29 mm Coronary Artery Height above Annulus: Left main: 17 mm Right coronary: 16 mm Coronary Arteries: Normal coronary origin. Right dominance. The study was performed without use of NTG and insufficient for plaque evaluation. Prior PCI. Optimum Fluoroscopic Angle for Delivery: LAO 12 CAU 10 OTHER: Atria: Grossly normal. Left atrial appendage: No thrombus. Mitral valve: Grossly normal, mild mitral annular calcifications. Pulmonary artery: Mildly dilated, 34 mm Pulmonary veins: Normal anatomy. IMPRESSION: 1. Tricuspid aortic valve with severely reduced cusp excursion. Severely thickened and severely calcified aortic valve cusps. 2. Aortic valve calcium score: 3744 3. Annulus area: 574 mm2, suitable for 29 mm  Sapien 3 valve. No LVOT calcifications. Membranous septal length 9. 4. Sufficient coronary artery heights from annulus. 5. Optimum fluoroscopic angle for delivery: LAO 12 CAU 10 Electronically Signed: By: Weston Brass M.D. On: 06/03/2023 08:42   CT ANGIO ABDOMEN PELVIS  W &/OR WO CONTRAST Result Date: 06/03/2023 CLINICAL DATA:  Aortic valve replacement, TAVR, preoperative evaluation. EXAM: CT ANGIOGRAPHY CHEST, ABDOMEN AND PELVIS TECHNIQUE: Non-contrast CT of the chest was initially obtained. Multidetector CT imaging through the chest, abdomen and pelvis was performed using the standard protocol during bolus administration of intravenous contrast. Multiplanar reconstructed images and MIPs were obtained and reviewed to evaluate the vascular anatomy. RADIATION DOSE  REDUCTION: This exam was performed according to the departmental dose-optimization program which includes automated exposure control, adjustment of the mA and/or kV according to patient size and/or use of iterative reconstruction technique. CONTRAST:  OMNIPAQUE IOHEXOL 350 MG/ML SOLN COMPARISON:  03/27/2021, 09/07/2019. FINDINGS: CTA CHEST FINDINGS Cardiovascular: The heart is borderline enlarged and there is a trace pericardial effusion. Multi-vessel coronary artery calcifications are noted. There is calcification of the aortic valve. There is atherosclerotic calcification of the aorta with aneurysmal dilatation of the ascending aorta measuring 4.0 cm. The pulmonary trunk is normal in caliber. No dissection is seen. Mediastinum/Nodes: No mediastinal, hilar, or axillary lymphadenopathy. The thyroid gland, trachea, and esophagus are within normal limits. Lungs/Pleura: Stable fibrotic changes and ground-glass attenuation is present in the lungs bilaterally. No consolidation, effusion, or pneumothorax is seen. Mild apical pleural and parenchymal thickening is present on the right. Musculoskeletal: Degenerative changes are present in the thoracic spine. No acute osseous abnormality is seen. Review of the MIP images confirms the above findings. CTA ABDOMEN AND PELVIS FINDINGS VASCULAR Aorta: Normal caliber aorta without dissection, vasculitis or significant stenosis. Aortic atherosclerosis. The abdominal aorta measures up to 2.5 cm in diameter. Celiac: Patent without evidence of aneurysm, dissection, vasculitis or significant stenosis. SMA: Patent without evidence of aneurysm, dissection, vasculitis or significant stenosis. Renals: Both renal arteries are patent without evidence of aneurysm, dissection, vasculitis, fibromuscular dysplasia or significant stenosis. IMA: Patent without evidence of aneurysm, dissection, vasculitis or significant stenosis. Inflow: Patent without evidence of aneurysm, dissection,  vasculitis or significant stenosis. Veins: No obvious venous abnormality within the limitations of this arterial phase study. Review of the MIP images confirms the above findings. NON-VASCULAR Hepatobiliary: No focal liver abnormality is seen. No gallstones, gallbladder wall thickening, or biliary dilatation. Pancreas: Unremarkable. No pancreatic ductal dilatation or surrounding inflammatory changes. Spleen: Normal size. A small hypodensity is present in the anterior aspect of the spleen, likely cyst or hemangioma. Adrenals/Urinary Tract: No adrenal nodule or mass. The kidneys enhance symmetrically. Nonobstructive renal calculi are present bilaterally. There is no hydronephrosis. The bladder is unremarkable. Stomach/Bowel: Small hiatal hernia is noted. Stomach is within normal limits. Appendix is surgically absent. No evidence of bowel wall thickening, distention, or inflammatory changes. No free air or pneumatosis is seen. Scattered diverticula are present along the colon without evidence of diverticulitis. Lymphatic: No abdominal or pelvic lymphadenopathy. Reproductive: Prostate is unremarkable. Other: No abdominopelvic ascites. A small fat containing umbilical hernia is present. Musculoskeletal: Degenerative changes are present in the lumbar spine. Bilateral pars defects are noted at L5 with mild anterolisthesis at L5-S1. No acute osseous abnormality is seen. Review of the MIP images confirms the above findings. IMPRESSION: 1. Aortic atherosclerosis with mild aneurysmal dilatation of the ascending aorta measuring 4.0 cm. Recommend annual imaging followup by CTA or MRA. This recommendation follows 2010 ACCF/AHA/AATS/ACR/ASA/SCA/SCAI/SIR/STS/SVM Guidelines  for the Diagnosis and Management of Patients with Thoracic Aortic Disease. Circulation. 2010; 121: W098-J191. Aortic aneurysm NOS (ICD10-I71.9) 2. Coronary artery calcifications. 3. Stable fibrotic and interstitial changes in the lungs bilaterally. No acute  infiltrate is seen. 4. Small hiatal hernia. 5. Bilateral nephrolithiasis. 6. Diverticulosis without diverticulitis. Electronically Signed   By: Thornell Sartorius M.D.   On: 06/03/2023 07:48   CT ANGIO CHEST AORTA W/CM & OR WO/CM Result Date: 06/03/2023 CLINICAL DATA:  Aortic valve replacement, TAVR, preoperative evaluation. EXAM: CT ANGIOGRAPHY CHEST, ABDOMEN AND PELVIS TECHNIQUE: Non-contrast CT of the chest was initially obtained. Multidetector CT imaging through the chest, abdomen and pelvis was performed using the standard protocol during bolus administration of intravenous contrast. Multiplanar reconstructed images and MIPs were obtained and reviewed to evaluate the vascular anatomy. RADIATION DOSE REDUCTION: This exam was performed according to the departmental dose-optimization program which includes automated exposure control, adjustment of the mA and/or kV according to patient size and/or use of iterative reconstruction technique. CONTRAST:  OMNIPAQUE IOHEXOL 350 MG/ML SOLN COMPARISON:  03/27/2021, 09/07/2019. FINDINGS: CTA CHEST FINDINGS Cardiovascular: The heart is borderline enlarged and there is a trace pericardial effusion. Multi-vessel coronary artery calcifications are noted. There is calcification of the aortic valve. There is atherosclerotic calcification of the aorta with aneurysmal dilatation of the ascending aorta measuring 4.0 cm. The pulmonary trunk is normal in caliber. No dissection is seen. Mediastinum/Nodes: No mediastinal, hilar, or axillary lymphadenopathy. The thyroid gland, trachea, and esophagus are within normal limits. Lungs/Pleura: Stable fibrotic changes and ground-glass attenuation is present in the lungs bilaterally. No consolidation, effusion, or pneumothorax is seen. Mild apical pleural and parenchymal thickening is present on the right. Musculoskeletal: Degenerative changes are present in the thoracic spine. No acute osseous abnormality is seen. Review of the MIP images  confirms the above findings. CTA ABDOMEN AND PELVIS FINDINGS VASCULAR Aorta: Normal caliber aorta without dissection, vasculitis or significant stenosis. Aortic atherosclerosis. The abdominal aorta measures up to 2.5 cm in diameter. Celiac: Patent without evidence of aneurysm, dissection, vasculitis or significant stenosis. SMA: Patent without evidence of aneurysm, dissection, vasculitis or significant stenosis. Renals: Both renal arteries are patent without evidence of aneurysm, dissection, vasculitis, fibromuscular dysplasia or significant stenosis. IMA: Patent without evidence of aneurysm, dissection, vasculitis or significant stenosis. Inflow: Patent without evidence of aneurysm, dissection, vasculitis or significant stenosis. Veins: No obvious venous abnormality within the limitations of this arterial phase study. Review of the MIP images confirms the above findings. NON-VASCULAR Hepatobiliary: No focal liver abnormality is seen. No gallstones, gallbladder wall thickening, or biliary dilatation. Pancreas: Unremarkable. No pancreatic ductal dilatation or surrounding inflammatory changes. Spleen: Normal size. A small hypodensity is present in the anterior aspect of the spleen, likely cyst or hemangioma. Adrenals/Urinary Tract: No adrenal nodule or mass. The kidneys enhance symmetrically. Nonobstructive renal calculi are present bilaterally. There is no hydronephrosis. The bladder is unremarkable. Stomach/Bowel: Small hiatal hernia is noted. Stomach is within normal limits. Appendix is surgically absent. No evidence of bowel wall thickening, distention, or inflammatory changes. No free air or pneumatosis is seen. Scattered diverticula are present along the colon without evidence of diverticulitis. Lymphatic: No abdominal or pelvic lymphadenopathy. Reproductive: Prostate is unremarkable. Other: No abdominopelvic ascites. A small fat containing umbilical hernia is present. Musculoskeletal: Degenerative changes are  present in the lumbar spine. Bilateral pars defects are noted at L5 with mild anterolisthesis at L5-S1. No acute osseous abnormality is seen. Review of the MIP images confirms the above findings. IMPRESSION: 1. Aortic  atherosclerosis with mild aneurysmal dilatation of the ascending aorta measuring 4.0 cm. Recommend annual imaging followup by CTA or MRA. This recommendation follows 2010 ACCF/AHA/AATS/ACR/ASA/SCA/SCAI/SIR/STS/SVM Guidelines for the Diagnosis and Management of Patients with Thoracic Aortic Disease. Circulation. 2010; 121: N829-F621. Aortic aneurysm NOS (ICD10-I71.9) 2. Coronary artery calcifications. 3. Stable fibrotic and interstitial changes in the lungs bilaterally. No acute infiltrate is seen. 4. Small hiatal hernia. 5. Bilateral nephrolithiasis. 6. Diverticulosis without diverticulitis. Electronically Signed   By: Thornell Sartorius M.D.   On: 06/03/2023 07:48   DG Chest 2 View Result Date: 06/02/2023 CLINICAL DATA:  Chest pain EXAM: CHEST - 2 VIEW COMPARISON:  03/29/2023 FINDINGS: Frontal and lateral views of the chest demonstrate an unremarkable cardiac silhouette. No airspace disease, effusion, or pneumothorax. Stable scarring. No acute bony abnormalities. IMPRESSION: 1. No acute intrathoracic process. Electronically Signed   By: Sharlet Salina M.D.   On: 06/02/2023 21:02   CARDIAC CATHETERIZATION Result Date: 05/27/2023 Images from the original result were not included.   Previously placed Prox LAD to Mid LAD stent of unknown type is  widely patent the second overlapping mid LAD stent has 50% in-stent restenosis (at the previous PTCA site) with 55% stenosed side branch in 2nd Diag.   Dist Cx lesion is 45% stenosed.   Previously placed Mid RCA stent of unknown type is  widely patent.   RPDA lesion is 50% stenosed.   Normal Right Heart Cath Numbers/Pressures Severe Aortic Stenosis by Echocardiogram (able to cross with wire but not with catheter due to tortuosity in the innominate artery. Widely  patent RCA stents with left 50% ISR of the LAD stent at the previous PTCA site-Diag bifurcation with 55% ostial Diag stenosis (does not appear to be flow-limiting) here Relatively Normal Right Heart Cath Pressures and Measurements: -RAP 5 mmHg, RV P-EDP 27/2-6 mmHg; PAP-mean 28/14-22 mmHg, PCWP 10 million mercury. -AO P-MAP 119/65-91 mmHg. -Ao sat 99%, PA sat 70%; Fick Cardiac Output 5.61-2.55. RECOMMENDATIONS Discharge home today after bedrest.  Has plan to follow-up with Dr. Excell Seltzer [facility] for TAVR evaluation Reassess symptoms after TAVR, if still symptomatic, could consider ischemic evaluation of the LAD with a Myoview stress test. Restart Eliquis p.m. 05/27/2023; restart Jardiance 05/28/2023; hold metformin 48 hours post cath Bryan Lemma, MD    Discharge Exam: Vitals:   06/22/23 0831 06/22/23 1141  BP: 128/78 125/74  Pulse: 73 69  Resp: 18 18  Temp: (!) 97.5 F (36.4 C) 97.7 F (36.5 C)  SpO2: 99% 100%   Vitals:   06/22/23 0400 06/22/23 0500 06/22/23 0831 06/22/23 1141  BP: 133/85  128/78 125/74  Pulse:   73 69  Resp:   18 18  Temp: 97.9 F (36.6 C)  (!) 97.5 F (36.4 C) 97.7 F (36.5 C)  TempSrc: Oral  Oral Oral  SpO2:   99% 100%  Weight:  89 kg    Height:        General: Pt is alert, awake, not in acute distress Cardiovascular: RRR, S1/S2 +, no rubs, no gallops Respiratory: CTA bilaterally, no wheezing, no rhonchi Abdominal: Soft, NT, ND, bowel sounds + Extremities: no edema, no cyanosis    The results of significant diagnostics from this hospitalization (including imaging, microbiology, ancillary and laboratory) are listed below for reference.     Microbiology: Recent Results (from the past 240 hours)  Resp panel by RT-PCR (RSV, Flu A&B, Covid) Anterior Nasal Swab     Status: None   Collection Time: 06/21/23  4:43 PM   Specimen: Anterior Nasal Swab  Result Value Ref Range Status   SARS Coronavirus 2 by RT PCR NEGATIVE NEGATIVE Final    Influenza A by PCR NEGATIVE NEGATIVE Final   Influenza B by PCR NEGATIVE NEGATIVE Final    Comment: (NOTE) The Xpert Xpress SARS-CoV-2/FLU/RSV plus assay is intended as an aid in the diagnosis of influenza from Nasopharyngeal swab specimens and should not be used as a sole basis for treatment. Nasal washings and aspirates are unacceptable for Xpert Xpress SARS-CoV-2/FLU/RSV testing.  Fact Sheet for Patients: BloggerCourse.com  Fact Sheet for Healthcare Providers: SeriousBroker.it  This test is not yet approved or cleared by the [place] FDA and has been authorized for detection and/or diagnosis of SARS-CoV-2 by FDA under an Emergency Use Authorization (EUA). This EUA will remain in effect (meaning this test can be used) for the duration of the COVID-19 declaration under Section 564(b)(1) of the Act, 21 U.S.C. section 360bbb-3(b)(1), unless the authorization is terminated or revoked.     Resp Syncytial Virus by PCR NEGATIVE NEGATIVE Final    Comment: (NOTE) Fact Sheet for Patients: BloggerCourse.com  Fact Sheet for Healthcare Providers: SeriousBroker.it  This test is not yet approved or cleared by the [place] FDA and has been authorized for detection and/or diagnosis of SARS-CoV-2 by FDA under an Emergency Use Authorization (EUA). This EUA will remain in effect (meaning this test can be used) for the duration of the COVID-19 declaration under Section 564(b)(1) of the Act, 21 U.S.C. section 360bbb-3(b)(1), unless the authorization is terminated or revoked.  Performed at [facility], [address], [place], [place] [address]      Labs: BNP (last 3 results) Recent Labs    06/02/23 1815 06/04/23 2211 06/21/23 1550  BNP 47.9 43.2 59.1   Basic Metabolic Panel: Recent Labs  Lab 06/21/23 1546 06/22/23 0413  NA 140 140  K 4.5 3.6  CL 102 106  CO2 21*  22  GLUCOSE 175* 112*  BUN 14 19  CREATININE 1.29* 1.27*  CALCIUM 10.1 9.2   Liver Function Tests: Recent Labs  Lab 06/21/23 1546  AST 34  ALT 28  ALKPHOS 76  BILITOT 0.8  PROT 7.7  ALBUMIN 4.2   No results for input(s): "LIPASE", "AMYLASE" in the last 168 hours. No results for input(s): "AMMONIA" in the last 168 hours. CBC: Recent Labs  Lab 06/21/23 1546 06/22/23 0413  WBC 11.7* 11.2*  NEUTROABS 9.0*  --   HGB 13.2 13.1  HCT 39.5 37.8*  MCV 88.0 84.8  PLT 186 177   Cardiac Enzymes: No results for input(s): "CKTOTAL", "CKMB", "CKMBINDEX", "TROPONINI" in the last 168 hours. BNP: Invalid input(s): "POCBNP" CBG: Recent Labs  Lab 06/21/23 2133 06/22/23 0828 06/22/23 1139  GLUCAP 151* 123* 172*   D-Dimer No results for input(s): "DDIMER" in the last 72 hours. Hgb A1c No results for input(s): "HGBA1C" in the last 72 hours. Lipid Profile No results for input(s): "CHOL", "HDL", "LDLCALC", "TRIG", "CHOLHDL", "LDLDIRECT" in the last 72 hours. Thyroid function studies No results for input(s): "TSH", "T4TOTAL", "T3FREE", "THYROIDAB" in the last 72 hours.  Invalid input(s): "FREET3" Anemia work up No results for input(s): "VITAMINB12", "FOLATE", "FERRITIN", "TIBC", "IRON", "RETICCTPCT" in the last 72 hours. Urinalysis    Component Value Date/Time   COLORURINE YELLOW 08/14/2020 1339   APPEARANCEUR HAZY (A) 08/14/2020 1339   LABSPEC 1.009 08/14/2020 1339   PHURINE 7.0 08/14/2020 1339   GLUCOSEU 50 (A) 08/14/2020 1339  HGBUR MODERATE (A) 08/14/2020 1339   BILIRUBINUR NEGATIVE 08/14/2020 1339   KETONESUR NEGATIVE 08/14/2020 1339   PROTEINUR NEGATIVE 08/14/2020 1339   UROBILINOGEN 0.2 10/22/2011 1732   NITRITE NEGATIVE 08/14/2020 1339   LEUKOCYTESUR NEGATIVE 08/14/2020 1339   Sepsis Labs Recent Labs  Lab 06/21/23 1546 06/22/23 0413  WBC 11.7* 11.2*   Microbiology Recent Results (from the past 240 hours)  Resp panel by RT-PCR (RSV, Flu A&B, Covid)  Anterior Nasal Swab     Status: None   Collection Time: 06/21/23  4:43 PM   Specimen: Anterior Nasal Swab  Result Value Ref Range Status   SARS Coronavirus 2 by RT PCR NEGATIVE NEGATIVE Final   Influenza A by PCR NEGATIVE NEGATIVE Final   Influenza B by PCR NEGATIVE NEGATIVE Final    Comment: (NOTE) The Xpert Xpress SARS-CoV-2/FLU/RSV plus assay is intended as an aid in the diagnosis of influenza from Nasopharyngeal swab specimens and should not be used as a sole basis for treatment. Nasal washings and aspirates are unacceptable for Xpert Xpress SARS-CoV-2/FLU/RSV testing.  Fact Sheet for Patients: BloggerCourse.com  Fact Sheet for Healthcare Providers: SeriousBroker.it  This test is not yet approved or cleared by the [place] FDA and has been authorized for detection and/or diagnosis of SARS-CoV-2 by FDA under an Emergency Use Authorization (EUA). This EUA will remain in effect (meaning this test can be used) for the duration of the COVID-19 declaration under Section 564(b)(1) of the Act, 21 U.S.C. section 360bbb-3(b)(1), unless the authorization is terminated or revoked.     Resp Syncytial Virus by PCR NEGATIVE NEGATIVE Final    Comment: (NOTE) Fact Sheet for Patients: BloggerCourse.com  Fact Sheet for Healthcare Providers: SeriousBroker.it  This test is not yet approved or cleared by the [place] FDA and has been authorized for detection and/or diagnosis of SARS-CoV-2 by FDA under an Emergency Use Authorization (EUA). This EUA will remain in effect (meaning this test can be used) for the duration of the COVID-19 declaration under Section 564(b)(1) of the Act, 21 U.S.C. section 360bbb-3(b)(1), unless the authorization is terminated or revoked.  Performed at [facility], [address], [place], [place] [address]     FURTHER DISCHARGE INSTRUCTIONS:    Get Medicines reviewed and adjusted: Please take all your medications with you for your next visit with your Primary MD   Laboratory/radiological data: Please request your Primary MD to go over all hospital tests and procedure/radiological results at the follow up, please ask your Primary MD to get all Hospital records sent to his/her office.   In some cases, they will be blood work, cultures and biopsy results pending at the time of your discharge. Please request that your primary care M.D. goes through all the records of your hospital data and follows up on these results.   Also Note the following: If you experience worsening of your admission symptoms, develop shortness of breath, life threatening emergency, suicidal or homicidal thoughts you must seek medical attention immediately by calling 911 or calling your MD immediately  if symptoms less severe.   You must read complete instructions/literature along with all the possible adverse reactions/side effects for all the Medicines you take and that have been prescribed to you. Take any new Medicines after you have completely understood and accpet all the possible adverse reactions/side effects.    Do not drive when taking Pain medications or sleeping medications (Benzodaizepines)   Do not take more than prescribed Pain, Sleep and Anxiety Medications. It  is not advisable to combine anxiety,sleep and pain medications without talking with your primary care practitioner   Special Instructions: If you have smoked or chewed Tobacco  in the last 2 yrs please stop smoking, stop any regular Alcohol  and or any Recreational drug use.   Wear Seat belts while driving.   Please note: You were cared for by a hospitalist during your hospital stay. Once you are discharged, your primary care physician will handle any further medical issues. Please note that NO REFILLS for any discharge medications will be authorized once you are discharged, as it is  imperative that you return to your primary care physician (or establish a relationship with a primary care physician if you do not have one) for your post hospital discharge needs so that they can reassess your need for medications and monitor your lab values  Time coordinating discharge: Over 30 minutes  SIGNED:   Hughie Closs, MD  [facility] 06/22/2023, 3:24 PM *Please note that this is a verbal dictation therefore any spelling or grammatical errors are due to the "Dragon Medical One" system interpretation. If 7PM-7AM, please contact night-coverage [email or web address]

## 2023-06-22 NOTE — Progress Notes (Deleted)
.  jhrou

## 2023-06-26 ENCOUNTER — Telehealth: Payer: Self-pay | Admitting: Pulmonary Disease

## 2023-06-26 ENCOUNTER — Other Ambulatory Visit (HOSPITAL_BASED_OUTPATIENT_CLINIC_OR_DEPARTMENT_OTHER): Payer: Self-pay

## 2023-06-26 DIAGNOSIS — J849 Interstitial pulmonary disease, unspecified: Secondary | ICD-10-CM

## 2023-06-26 MED ORDER — PREDNISONE 5 MG PO TABS: ORAL_TABLET | ORAL | 0 refills | Status: DC

## 2023-06-26 NOTE — Telephone Encounter (Signed)
 Rx sent to pharmacy and pt notified on VM

## 2023-06-26 NOTE — Telephone Encounter (Signed)
**Note De-identified  [place] Obfuscation** Please advise 

## 2023-06-26 NOTE — Telephone Encounter (Signed)
 Patient needs a refill of prednisone (10, 10mg  and 10, 5mg ) He was released from the hospital and they gave him time released prednisone but a refill on that is $3,000 so they would prefer a refill of the usual prednisone that Dr.Alva prescribes. [phone]  [employer]

## 2023-06-29 NOTE — Progress Notes (Unsigned)
 Cardiology Office Note:  .   Date:  07/01/2023  ID:  Kenneth [name], DOB 11-15-1945, MRN [id number] PCP: [facility] Providers Cardiologist:  Bryan Lemma, MD     No chief complaint on file.   Patient Profile: .     Kenneth [name] is a  78 y.o. male  with a PMH reviewed below who presents here for close hospital follow-up from TAVR at the request of Kenneth Fret, MD.  CHA GOMILLION is a very pleasant 78 y.o. male with a PMH reviewed below-notable for CAD-PCI, PAF/atrial flutter s/p flutter ablation in 2006, A-fib ablation in 2015), Aortic Stenosis, and interstitial lung disease who presents here for telephone follow-up to discuss test results and plan based on recent echocardiogram showing progression of aortic stenosis from moderate to severe, he presents at the request of Kenneth [name] for PCCM.   He has been followed here at the request of Dr. Anson [name] from [facility], however he is currently in the process of changing to a new [facility].  PERTINENT HISTORY: 2006: Atrial Flutter - s/p Ablation 01/2014: Afib Ablation (off OAC) 06/2016: CAD-PCI sx of DOE -> R&LHC - mild Pulm HTN, Severe mRCA 95% (DES PCI), p-mLAD ~70% w/ FFR 0.75 (staged DES PCI). Co-dom LCx distal 60% & OM3 55%.  07/2016: Staged PCI LAD 10/11/2020 - R&LHC - FFR guided DES PCI (PTCA ost D1); patent mRCA DES as well as p-m LAD. => m-d LAD 70% (after D1 - 60% ost D1) - FFR 0.75 -> Synergy DES 2.5 x 32 (post-dilation 3.7-2.6 mm), Ost D1 PTCA -> reduced to 40%. 07/04/2021 - R&LHC: Significant ISR at LAD-D2 bifurcation site ->  PTCA with kissing balloon technique in D2.  Restored TIMI-3 flow reduce the LAD lesion to 25% and D2 40% Stress PET 03/2022:  NORMAL PERFUSION/LOW RISK.  Normal LVEF.  Suspected reduced myocardial blood flow reserve related to microvascular disease.  No infarction or ischemia.Kenneth [name]  Resting EF 48% and stress EF 54%.  Mild LV enlargement.  Stable mosaic attenuation in the  lungs. Progression to Severe Aortic Stenosis by last Echo mean AVG 26 mmHg, by Cath 33 mmHg TAVR 06/10/2023 (29 mm SAPIEN)  Follow-up Echocardiogram (06/21/2023) showed mean AVG 8 mm 3 Interstitial Lung Disease on chronic steroids and Adalimumab     I last saw Kenneth [name] in person back in September 2024 and he was doing relatively well.  We had planned to reassess his aortic stenosis at follow-up visit.  However in the interim, he was seen by his pulmonologist Kenneth [name], who was concerned about progressively worsening dyspnea and reassess an echocardiogram that showed progression of his aortic stenosis to possibly severe AS.  Upon review of the results, I set up a telephone visit on 05/20/2023 to discuss his symptoms and plans for further evaluation.  He was noting pretty significant exertional dyspnea a bit of chest tightness.  He was scheduled for cardiac catheterization on 05/27/2023 with results revealing relatively stable findings and reassuring right heart cath pressures.  There is some mild in-stent restenosis but not felt to be significant enough to cause the symptoms.  We therefore proceeded with plan for structural heart team consultation in the outpatient setting.  [facility], unfortunately he read presented on 06/02/2023 with worsening dyspnea and tachypnea which led to inpatient evaluation by Kenneth [name].  He did relatively well with titration medications and mild diuresis so we decided to discharge  him on 06/03/2023 with plans for him to see Kenneth [name] for the CVTS consultation portion on 06/04/2023.  When he saw Kenneth [name], he was again profoundly dyspneic and Kenneth [name] felt that with his underlying lung disease, he was just simply not able to tolerate the level of aortic stenosis and had an admitted to be monitored until he can undergo TAVR on 06/10/2023.  Immediately post TAVR he felt remarkably better.  He was seen by Kenneth Crock, PA on 06/16/2023 for post TAVR  follow-up.  Was doing well.  No chest pain or any notable dyspnea.  No edema.  No orthopnea or PND.  Noted significant improvement in his fatigue levels.  Great improvement in energy level. => Echo showed stable gradient of 5 to 8 mmHg mean gradient. => Discussed SBE prophylaxis.  Cleared to restart Eliquis, and resume all activities without restriction.  Subjective  Discussed the use of AI scribe software for clinical note transcription with the patient, who gave verbal consent to proceed.  History of Present Illness   Kenneth [name] is a 78 year old male with coronary artery disease who presents with chest pain and sleep disturbances. He is accompanied by his wife, Kenneth [name].  He experiences unpredictable chest pain lasting two to three minutes, occurring without exertion. The pain is located in the chest and does not significantly worsen with physical activity, although it is present when walking. It improves with rest. He has a history of coronary artery disease and underwent a heart valve replacement, which has improved his condition compared to before the surgery. He is currently taking Eliquis and Ranexa, with a recent increase in the morning dose of Ranexa to 500 mg.  He has significant sleep disturbances, stating he did not sleep at all the previous night. He has been prescribed a sleeping pill by the [place], but it has not been effective. His wife notes that he sometimes attempts to wake her up at night due to his inability to sleep.  He has a history of PTSD and has been prescribed hydroxyzine for anxiety, which causes nausea. He has not found it effective for his symptoms. He has been practicing 'four corners breathing' to manage panic attacks, which has been helpful.  He feels worn out by the end of the day, with better energy levels in the morning. He is trying to build up his walking tolerance but finds it challenging. He uses a golf cart to assist with activities like filling bird feeders.  His breathing worsens in the evenings, although it is better than before his valve replacement. No fever, no significant swelling in the legs, no irregular heartbeats, no significant shortness of breath during exertion, but he feels 'huffy puffy' at times.  He has a history of psoriasis and was previously on Humira, which was changed to a different medication by the [place] due to cost issues. He has not reported any recent issues with psoriasis.     ROS:  Review of Systems - Negative except symptoms noted above-certainly has anxiety attacks and insomnia.  Gets frustrated with the fact that he is not able to do what he wants to do.    Objective   Medications: A-fib: Eliquis 5 mg twice daily CAD-angina: Crestor 40 mg daily, Ranexa 500 mg twice daily, as needed NTG; CHF: Furosemide 20 mg Monday and Thursday and PRN DM-2: Jardiance 12.5 mg daily, metformin 500 mg nightly Neuropsych: Namenda 20 mg nightly GI: Prilosec 20 mg nightly as needed GU: Flomax 0.4  mg PRN Derm/pulm: Prednisone PRN taper; Zyrtec 10 mg daily, adalimumab 40 mg every 14 days Ferrous sulfate 625 mg Monday Wednesday Friday  Studies Reviewed: Kenneth [name]   EKG Interpretation Date/Time:  Monday June 30 2023 16:19:17 EDT Ventricular Rate:  78 PR Interval:  148 QRS Duration:  84 QT Interval:  378 QTC Calculation: 430 R Axis:   23  Text Interpretation: Normal sinus rhythm Normal ECG When compared with ECG of 22-Jun-2023 05:35, No significant change was found Confirmed by Bryan Lemma ([id number]) on 06/30/2023 4:39:11 PM    ECHO (05/14/2023): EF 60 to 65%.  No RWMA.  GR 2 DD.  Normal RV size with normal PAP estimated at roughly 35 mmHg.  Moderate LA dilation.  Mild MR with moderate MAC.  Moderate TR.  Severe aortic calcification with mild AI but severe AS-suggest mean gradient of the aortic valve being 42 mmHg.  Ascending already measuring 41 mm.  Normal IVC indicating normal CVP R&LHC (pre-TAVR) (05/27/2023):  Previously placed Prox LAD to Mid  LAD stent of unknown type is widely patent the second overlapping mid LAD stent has 50% in-stent restenosis (at the previous PTCA site) with 55% stenosed side branch in 2nd Diag. Dist Cx lesion is 45% stenosed. Previously placed Mid RCA stent of unknown type is widely patent. RPDA lesion is 50% stenosed.  Right Heart Cath Pressures and Measurements: -RAP 5 mmHg, RV P-EDP 27/2-6 mmHg; PAP-mean 28/14-22 mmHg, PCWP 10 million mercury.  -AO P-MAP 119/65-91 mmHg. -Ao sat 99%, PA sat 70%; Fick Cardiac Output 5.61-2.55.    CT Coronary Morph 05/29/2023: Aortic atherosclerosis with ascending aorta measuring 4.0 mm.  Coronary calcification noted.  Emphysema with stable ILD noted.  Severely calcified tricuspid aortic valve with reduced cusp excursion.  Severely thickened and certainly calcified cusps.  Calcium score 3744. => Consistent with severe AS TAVR 06/10/2023 (Kenneth [name], Kenneth [name]): Randa Evens SAPIEN 3 ultra Resilia 29 mm valve placed.  CT-guided.  Postplacement echo showed normal functioning valve with mean gradient of 5 mmHg.  EF noted to be 60 to 65% with GR 2 DD.  Degenerated mitral valve with mild MAC. Post TAVR Echo #2 06/21/2023: LVEF 60 to 65%.  Mild concentric LVH.  Mild LA dilation.  Small precordial effusion.  Moderate MAC.  Mild MR.  29 mm TAVR valve in place.  Mean AVG 8 mmHg.  Normal RAP.  Lab Results  Component Value Date   CHOL 176 05/22/2023   HDL 49 05/22/2023   LDLCALC 80 05/22/2023   TRIG 286 (H) 05/22/2023   CHOLHDL 3.6 05/22/2023   Lab Results  Component Value Date   NA 140 06/22/2023   K 3.6 06/22/2023   CREATININE 1.27 (H) 06/22/2023   GFRNONAA 58 (L) 06/22/2023   GLUCOSE 112 (H) 06/22/2023      Latest Ref Rng & Units 06/22/2023    4:13 AM 06/21/2023    3:46 PM 06/11/2023    3:35 AM  CBC  WBC 4.0 - 10.5 K/uL 11.2  11.7  8.5   Hemoglobin 13.0 - 17.0 g/dL 72.5  36.6  44.0   Hematocrit 39.0 - 52.0 % 37.8  39.5  38.6   Platelets 150 - 400 K/uL 177  186  167      Risk  Assessment/Calculations:    CHA2DS2-VASc Score = 5   This indicates a 7.2% annual risk of stroke. The patient's score is based upon: CHF History: 1 HTN History: 0 Diabetes History: 1 Stroke History: 0 Vascular Disease History: 1 Age  Score: 2 Gender Score: 0   He is on stable dose of Eliquis.          Physical Exam:   VS:  BP 124/76 (BP Location: Left Arm, Patient Position: Sitting, Cuff Size: Large)   Pulse 78   Ht 6\' 2"  (1.88 m)   Wt 200 lb (90.7 kg)   SpO2 99%   BMI 25.68 kg/m    Wt Readings from Last 3 Encounters:  06/30/23 200 lb (90.7 kg)  06/22/23 196 lb 3.4 oz (89 kg)  06/16/23 202 lb 3.2 oz (91.7 kg)    GEN: Well nourished, well developed in no acute distress; although he seems somewhat anxious with intermittent episodes of tachypnea and increased work of breathing.  He has loud pursed lip breathing as well. NECK: No JVD; No carotid bruits CARDIAC: Normal S1, S2; RRR, minimal residual AAS murmur but otherwise no murmurs, rubs, gallops RESPIRATORY: Mild interstitial sounds but otherwise CTAB W/O rales, wheezing or rhonchi ; nonlabored, good air movement.-He just has ABDOMEN: Soft, non-tender, non-distended EXTREMITIES:  No edema; No deformity     ASSESSMENT AND PLAN: .    Problem List Items Addressed This Visit       Cardiology Problems   CAD S/P percutaneous coronary angioplasty (Chronic)   History of PCI to LAD and RCA.  No longer on Thienopyridine due to Eliquis. Recent cardiac cath did show suppression disease in the LAD but did not seem to be significant enough to warrant hemic evaluation at this time, but likely in the near future. -Continue rosuvastatin 40 a.m. daily along with Ranexa 500 mg twice daily but increase morning dose to 1000 mg -As BP improves, will consider reevaluation of BP/antianginal meds.  Reassess in close follow-up, low threshold to evaluate with noninvasive ischemic testing to ensure the LAD is or is not significant.       Relevant Medications   ranolazine (RANEXA) 500 MG 12 hr tablet   Chronic heart failure with preserved ejection fraction (HFpEF) (HCC) (Chronic)   Chronic HFpEF now with significant improved valvular disease and preserved EF, he seems to be pretty euvolemic without requiring significant dosing of diuretic.  I am not sure that his dyspnea is CHF related especially now that he is afterload reduction has essentially been removed by the aortic valve replacement. I suspect a lot of his dyspnea is related to PTSD. Reevaluate in the future.  Likely NYHA class II symptoms as I do not know how much the roleMedications - Atorvastatin (dose increased from 20 mg to 40 mg) - Aspirin (dose switched from 325 mg to 81 mg)      Relevant Medications   ranolazine (RANEXA) 500 MG 12 hr tablet   Other Relevant Orders   EKG 12-Lead (Completed)   Coronary artery disease involving native coronary artery with angina pectoris (HCC) - Primary (Chronic)   I am not sure if he is having angina or what is causing his dyspnea.  Heart cath films were reviewed and there is some in-stent restenosis of the LAD but not likely enough to cause such severity of dyspnea even with and without chest comfort.  Unfortunately he did not really feel much better now post TAVR.  Would like for him to recover from this respiratory issue but low threshold to consider Coronary CTA  Continue Ranexa 1000 mg a.m. and 500 mg p.m. along with Lasix and Jardiance      Relevant Medications   ranolazine (RANEXA) 500 MG 12 hr tablet  Other Relevant Orders   EKG 12-Lead (Completed)   Hypercoagulable state due to paroxysmal atrial fibrillation (HCC); CHA2DS2-VASc score 5 (Chronic)   Remains on Eliquis for history of PAF given elevated CHA2DS2-VASc score.  Okay to hold 2 to 3 days preop for procedures or surgeries.      Relevant Medications   ranolazine (RANEXA) 500 MG 12 hr tablet   Other Relevant Orders   EKG 12-Lead (Completed)    Hyperlipidemia associated with type 2 diabetes mellitus (HCC) (Chronic)   Most recent lipids from February showed LDL of 80.  With everything going on I am reluctant to be overly aggressive but probably needs more aggressive lipid management going forward beyond 40 of omeprazole. Low threshold to reach out to heart failure service.      Relevant Medications   ranolazine (RANEXA) 500 MG 12 hr tablet   Hypertension associated with diabetes (HCC) (Chronic)   Not currently on any real blood pressure medications.  Continue to monitor.      Relevant Medications   ranolazine (RANEXA) 500 MG 12 hr tablet   Other Relevant Orders   EKG 12-Lead (Completed)   Paroxysmal atrial fibrillation (HCC) (Chronic)   Appears to be maintaining sinus rhythm.  Not on rate control agents anymore. Remains on Eliquis 5 mg twice daily      Relevant Medications   ranolazine (RANEXA) 500 MG 12 hr tablet   Other Relevant Orders   EKG 12-Lead (Completed)     Other   Exertional dyspnea (Chronic)   This is becoming a major issue and that he is mostly having exertional dyspnea with occasional chest discomfort. Intermittent chest pain, not exertion-related. Heart catheterization showed non-significant stent narrowing. Aortic valve replacement successful. Possible small vessel or residual coronary artery disease. Stress test may be needed if symptoms persist. - Increase morning Ranexa to 1000 mg and continue 500 mg in the evening mg. - Consider stress test if symptoms persist by next appointment.      ILD (interstitial lung disease) (HCC) (Chronic)   I still suspect this is playing a role in his dyspnea but cannot be sure.      Relevant Orders   EKG 12-Lead (Completed)   S/P TAVR (transcatheter aortic valve replacement) (Chronic)   By echo things look pretty good.  Interestingly, having issues with low blood pressures.  Can reassess With routine follow-ups by heart failure team.      Relevant Orders   EKG  12-Lead (Completed)     Sleep Disturbance Difficulty sleeping. [facility]-prescribed sleeping pill effectiveness unclear. Melatonin suggested as safer alternative to Benadryl. - Start melatonin 5 mg at night. - Consider prescribed sleeping pill if melatonin ineffective.  Anxiety and PTSD Experiences anxiety and PTSD. Hydroxyzine causes nausea. SSRIs may be more effective. Advised to discuss alternatives with [facility]. - Discuss alternative anxiety medications with [facility] if hydroxyzine causes nausea.  Psoriasis Psoriasis treatment changed from Humira due to cost. Plans to contact dermatology for medication options. - Contact dermatology to discuss psoriasis medication options.  Recording duration: 34 minutes        Follow-Up: Return in about 2 months (around 08/30/2023).      Signed, Marykay Lex, MD, MS Bryan Lemma, M.D., M.S. Interventional Cardiologist  [facility]  Pager # [phone] Phone # [phone] [address] [place], [place] [address]

## 2023-06-30 ENCOUNTER — Ambulatory Visit: Payer: Medicare Other | Attending: Cardiology | Admitting: Cardiology

## 2023-06-30 VITALS — BP 124/76 | HR 78 | Ht 74.0 in | Wt 200.0 lb

## 2023-06-30 DIAGNOSIS — I5032 Chronic diastolic (congestive) heart failure: Secondary | ICD-10-CM | POA: Diagnosis not present

## 2023-06-30 DIAGNOSIS — I152 Hypertension secondary to endocrine disorders: Secondary | ICD-10-CM | POA: Diagnosis not present

## 2023-06-30 DIAGNOSIS — D6869 Other thrombophilia: Secondary | ICD-10-CM | POA: Insufficient documentation

## 2023-06-30 DIAGNOSIS — R0609 Other forms of dyspnea: Secondary | ICD-10-CM | POA: Diagnosis not present

## 2023-06-30 DIAGNOSIS — I2510 Atherosclerotic heart disease of native coronary artery without angina pectoris: Secondary | ICD-10-CM

## 2023-06-30 DIAGNOSIS — I25119 Atherosclerotic heart disease of native coronary artery with unspecified angina pectoris: Secondary | ICD-10-CM | POA: Diagnosis not present

## 2023-06-30 DIAGNOSIS — Z9861 Coronary angioplasty status: Secondary | ICD-10-CM | POA: Insufficient documentation

## 2023-06-30 DIAGNOSIS — E1159 Type 2 diabetes mellitus with other circulatory complications: Secondary | ICD-10-CM | POA: Insufficient documentation

## 2023-06-30 DIAGNOSIS — E785 Hyperlipidemia, unspecified: Secondary | ICD-10-CM | POA: Diagnosis not present

## 2023-06-30 DIAGNOSIS — I480 Paroxysmal atrial fibrillation: Secondary | ICD-10-CM | POA: Diagnosis not present

## 2023-06-30 DIAGNOSIS — J849 Interstitial pulmonary disease, unspecified: Secondary | ICD-10-CM | POA: Diagnosis not present

## 2023-06-30 DIAGNOSIS — E1169 Type 2 diabetes mellitus with other specified complication: Secondary | ICD-10-CM

## 2023-06-30 DIAGNOSIS — Z952 Presence of prosthetic heart valve: Secondary | ICD-10-CM | POA: Diagnosis not present

## 2023-06-30 MED ORDER — RANOLAZINE ER 500 MG PO TB12: ORAL_TABLET | ORAL | 3 refills | Status: DC

## 2023-06-30 NOTE — Patient Instructions (Addendum)
 Medication Instructions:   Okay  to  use Hydroxyzine for sleep    Okay to use Sertraline fo PTSD  Increase morning dose of Ranexa to 1000 mg  and 500 mg in the evening  *If you need a refill on your cardiac medications before your next appointment, please call your pharmacy*   Lab Work: Not needed    Testing/Procedures:  Not needed  Follow-Up: At [facility], you and your health needs are our priority.  As part of our continuing mission to provide you with exceptional heart care, we have created designated Provider Care Teams.  These Care Teams include your primary Cardiologist (physician) and Advanced Practice Providers (APPs -  Physician Assistants and Nurse Practitioners) who all work together to provide you with the care you need, when you need it.     Your next appointment:   2 month(s)  The format for your next appointment:   In Person  Provider:   Bryan Lemma, MD    Other Instructions   If your symptoms are not better by the ime you have your follow- up  TAVR appointment - they may order some testing per Dr Herbie Baltimore

## 2023-07-01 ENCOUNTER — Encounter: Payer: Self-pay | Admitting: Cardiology

## 2023-07-01 NOTE — Assessment & Plan Note (Signed)
 I still suspect this is playing a role in his dyspnea but cannot be sure.

## 2023-07-01 NOTE — Assessment & Plan Note (Signed)
 This is becoming a major issue and that he is mostly having exertional dyspnea with occasional chest discomfort. Intermittent chest pain, not exertion-related. Heart catheterization showed non-significant stent narrowing. Aortic valve replacement successful. Possible small vessel or residual coronary artery disease. Stress test may be needed if symptoms persist. - Increase morning Ranexa to 1000 mg and continue 500 mg in the evening mg. - Consider stress test if symptoms persist by next appointment.

## 2023-07-01 NOTE — Assessment & Plan Note (Signed)
 I am not sure if he is having angina or what is causing his dyspnea.  Heart cath films were reviewed and there is some in-stent restenosis of the LAD but not likely enough to cause such severity of dyspnea even with and without chest comfort.  Unfortunately he did not really feel much better now post TAVR.  Would like for him to recover from this respiratory issue but low threshold to consider Coronary CTA  Continue Ranexa 1000 mg a.m. and 500 mg p.m. along with Lasix and Jardiance

## 2023-07-01 NOTE — Assessment & Plan Note (Signed)
 Appears to be maintaining sinus rhythm.  Not on rate control agents anymore. Remains on Eliquis 5 mg twice daily

## 2023-07-01 NOTE — Assessment & Plan Note (Signed)
 Not currently on any real blood pressure medications.  Continue to monitor.

## 2023-07-01 NOTE — Assessment & Plan Note (Signed)
 Most recent lipids from February showed LDL of 80.  With everything going on I am reluctant to be overly aggressive but probably needs more aggressive lipid management going forward beyond 40 of omeprazole. Low threshold to reach out to heart failure service.

## 2023-07-01 NOTE — Assessment & Plan Note (Addendum)
 Chronic HFpEF now with significant improved valvular disease and preserved EF, he seems to be pretty euvolemic without requiring significant dosing of diuretic.  I am not sure that his dyspnea is CHF related especially now that he is afterload reduction has essentially been removed by the aortic valve replacement. I suspect a lot of his dyspnea is related to PTSD. Reevaluate in the future.  Likely NYHA class II symptoms as I do not know how much the roleMedications - Atorvastatin (dose increased from 20 mg to 40 mg) - Aspirin (dose switched from 325 mg to 81 mg)

## 2023-07-01 NOTE — Assessment & Plan Note (Signed)
 Remains on Eliquis for history of PAF given elevated CHA2DS2-VASc score.  Okay to hold 2 to 3 days preop for procedures or surgeries.

## 2023-07-01 NOTE — Assessment & Plan Note (Signed)
 By echo things look pretty good.  Interestingly, having issues with low blood pressures.  Can reassess With routine follow-ups by heart failure team.

## 2023-07-01 NOTE — Assessment & Plan Note (Signed)
 History of PCI to LAD and RCA.  No longer on Thienopyridine due to Eliquis. Recent cardiac cath did show suppression disease in the LAD but did not seem to be significant enough to warrant hemic evaluation at this time, but likely in the near future. -Continue rosuvastatin 40 a.m. daily along with Ranexa 500 mg twice daily but increase morning dose to 1000 mg -As BP improves, will consider reevaluation of BP/antianginal meds.  Reassess in close follow-up, low threshold to evaluate with noninvasive ischemic testing to ensure the LAD is or is not significant.

## 2023-07-11 NOTE — Progress Notes (Unsigned)
 [facility]   MULTIDISCIPLINARY HEART VALVE CLINIC                                     Cardiology Office Note:    Date:  07/14/2023   ID:  Kenneth [name], DOB December 24, 1945, MRN [id number]  PCP:  Clinic, [facility]  [facility] Cardiologist:  Bryan Lemma, MD  / Dr. Excell Seltzer & Dr. Laneta Simmers (TAVR)  [facility] Electrophysiologist:  None   Referring MD: [facility], [facility]   1 month s/p TAVR   History of Present Illness:    Kenneth [name] is a 78 y.o. male with a hx of paroxysmal atrial flutter s/p ablation 2006 & 2015, CAD s/p multiple PCIs, interstitial lung disease on chronic low dose prednisone, HTN, HLD, DMT2, memory issues, plaque psoriasis and severe AS s/p TAVR (06/04/23) who presents to clinic for follow up.  Patient had dyspnea on exertion in 2018 and underwent L/RHC that showed mild pulmonary hypertension, severe 95% mid RCA lesion treated with DES, 70% proximal to mid LAD lesion with FFR of 0.75 treated with staged PCI DES, 60% distal left circumflex lesion, 55% OM 3 lesion. Repeat L/RHC performed in 2022 showed a 70% mid to distal LAD lesion with FFR 0.75 treated with Synergy DES and 60% ostial D1 lesion treated with PTCA. Repeat L/RHC in 2023 showed significant in-stent restenosis at LAD-D2 bifurcation site treated with PTCA with kissing balloon technique. He has a history of ILD followed closely by Dr. Vassie Loll. He is maintained on low dose chronic prednisone with escalations during acute exacerbations. He was recently noted to have a worsening murmur by Dr. Vassie Loll and echo was ordered. Echo 05/14/2023 showed EF 60-65%, G2DD, mild MR, moderate TR, mild AI and severe aortic stenosis with a mean grad 42 mmHg, peak grad 56 mmHg, [name] 0.78 cm2, DVI 0.25, SVI 31. He underwent L/RHC on 05/27/2023 which showed 50% mid LAD in-stent restenosis, 50% stenosis in sidebranch in D2, 45% distal left circumflex lesion, 50% RPDA lesion, wedge pressure 10 mmHg, PA pressure 28/14 with mean  22 mmHg. Patient was referred to Dr. Excell Seltzer for structural heart consultation. Before being seen by the Structural Heart Team, he was admitted from 06/02/2023 to 06/03/2023 for persistent fatigue, atypical chest pain, and dyspnea. High-sensitivity troponin negative. BNP normal. He did not appear to volume overloaded and shortness of breath was not felt to be solely cardiac in nature. He was seen by Dr. Excell Seltzer inpatient and completed TAVR workup. Gated CTA showed tricuspid aortic valve with severely reduced cusp excursion, aortic valve calcium score of 3744, and an annular area of 574 mm, suitable for a 29 mm Edwards SAPIEN 3 valve. He was discharged with plans to see Dr. Laneta Simmers on 06/04/23. At his appointment, noted to be acute short of breath. Therefore, he was directly admitted to the hospital with plans to keep him inpatient until his TAVR. S/p TAVR with a 29 mm Edwards Sapien 3 Ultra Resilia  THV via the TF approach on 06/10/23. Post operative echo showed EF 60%, mod LVH, normally functioning TAVR with a mean gradient of 6 mmHg and no PVL as well as moderate MAC. He had marked symptomatic improvement post op and was discharged home the following day.   Seen in the ER on 06/22/23 for acute SOB. Echo showed normal LV function and normally functioning TAVR with small pericardial effusion. Seen back by  Dr. Herbie Baltimore and Ranexa increased. He mentioned consideration of a stress test if symptoms persisted.   Today the patient presents to clinic for follow up. Here with wife. Has had to adjust because he cannot be as active or fit as he was when he was 50s. Made 5 pens last week and came back from the garage huffing and puffing and has to rest. Even a trip to the grocery store can be challenging. Wife does think that his symptoms are better after increase in Ranexa. No CP. No LE edema, orthopnea or PND. Occasional dizziness with positional changes but no syncope. No blood in stool or urine. No palpitations.    Past  Medical History:  Diagnosis Date   Arthritis    CAD S/P percutaneous coronary angioplasty 06/28/2016   Cardiologist- Dr. Herbie Baltimore: 06/28/16 -- [facility] PCI (Synergy DES 2.75 x 20 mm); 07/10/16: Staged p-m LAD PCI (FFR 0.75) - DES PCI (Synergy DES 3.5 x 20 mm); 10/11/2020: mid-distal LAD 70% (FFR 0.75) DES PCI (Synergy DES 2.5 mm x 32 mm - overlaps prox-mid stent, tapered post-dilation 3.7 - 2.6 mm.;  06/2021 - Cath with severe LAD-D2 bifurcation ISR -- PTCA / kissing balloon - reduced LAD to ~25% & D2 to 40%   Chronic dryness of both eyes    Chronic heart failure with preserved ejection fraction (HFpEF) (HCC) 05/22/2013   Diverticulosis of colon    Epiretinal membrane    "epiretinal attachment" (06/28/2016) 10-14-2017 per pt oringinally in both , now only unilateral    GERD (gastroesophageal reflux disease)    Hiatal hernia    History of adenomatous polyp of colon    History of concussion 08/23/2017   w/o loc--- per pt no residual   History of kidney stones    History of rheumatic fever 1958   NO Evidence of Vavlular disease -> only mild Aortic Sclerosis   History of skin cancer    excision leg;  froze the left arm--- unsure BCC or SCC    Hyperlipidemia    Hypertension    ILD (interstitial lung disease) (HCC)    pulmologist-  dr Vassie Loll--  secondary to methotrexate use --- last PFTs 04-25-2015  mild restriction   Iron deficiency anemia    Mild obstructive sleep apnea    10-04-2019 per pt  had a sleep study years ago , was told no cpap recommended pt denies    Nephrolithiasis    CT 09-07-2019 bilateral nonobstructive stones   Paroxysmal atrial fibrillation (HCC) 2015   a. 11/2003 Tikosyn initiated - subsequently d/c'd;  b. 2006 RFCA for Afib @ [facility];  c. 09/2011 Echo: EF 60-65%, Gr 2 DD;  d. DCCV 10/2011 , 04/2012, 12/ 2014, & 02/ 2015;  e. RFCA 01-13-2014   Plaque psoriasis 1969   followed by rheumatologist  and dermatologist @ [facility] in [place]   PONV (postoperative nausea and vomiting)    Right  ureteral stone    S/P drug eluting coronary stent placement    06-28-2016  x1 to mRCA;  07-10-2016 x1 to mLAD   S/P TAVR (transcatheter aortic valve replacement) 06/10/2023   s/p TAVR with a 29 mm Edwards S3UR via TF approach by Drs Excell Seltzer and Laneta Simmers   Scarring of lung    Seasonal allergies    Short-term memory loss    Type 2 diabetes mellitus (HCC)    followed by pcp   Ureteral calculus, right    Wears hearing aid in both ears      Current Medications:  Current Meds  Medication Sig   acetaminophen (TYLENOL) 500 MG tablet Take 1,000 mg by mouth every 6 (six) hours as needed for mild pain (pain score 1-3) or moderate pain (pain score 4-6).   Adalimumab-bwwd (HADLIMA) 40 MG/0.4ML SOSY Inject 40 mg into the skin every 14 (fourteen) days.   amoxicillin (AMOXIL) 500 MG tablet Take 4 tablets (2,000 mg total) by mouth as directed. 1 hour prior to dental work including cleanings (Patient taking differently: Take 2,000 mg by mouth daily as needed (For dental cleaning only).)   apixaban (ELIQUIS) 5 MG TABS tablet Take 1 tablet (5 mg total) by mouth 2 (two) times daily.   cetirizine (ZYRTEC) 10 MG tablet Take 10 mg by mouth daily.   Dorzolamide HCl-Timolol Mal PF 2-0.5 % SOLN Place 1 drop into both eyes at bedtime.   empagliflozin (JARDIANCE) 25 MG TABS tablet Take 12.5 mg by mouth daily.   ferrous sulfate 325 (65 FE) MG tablet Take 325 mg by mouth every Monday, Wednesday, and Friday.   furosemide (LASIX) 20 MG tablet Take 20 mg by mouth See admin instructions. Take 1 tablet (20mg ) twice a week on Monday and Thursday.   galantamine (RAZADYNE ER) 24 MG 24 hr capsule Take 24 mg by mouth in the morning.   memantine (NAMENDA) 10 MG tablet Take 20 mg by mouth at bedtime.   metFORMIN (GLUCOPHAGE) 500 MG tablet Take 500 mg by mouth at bedtime.   Multiple Vitamins-Minerals (MULTIVITAMIN GUMMIES MENS) CHEW Chew 1 each by mouth daily.   omeprazole (PRILOSEC OTC) 20 MG tablet Take 20 mg by mouth daily as  needed (Acid reflux).   ranolazine (RANEXA) 500 MG 12 hr tablet Take 2 tablets ( 1000 mg) in the morning  and 1 tablet ( 500 mg) in the evening   rosuvastatin (CRESTOR) 40 MG tablet Take 1 tablet (40 mg total) by mouth daily.   tamsulosin (FLOMAX) 0.4 MG CAPS capsule Take 1 capsule by mouth once daily      ROS:   Please see the history of present illness.    All other systems reviewed and are negative.  EKGs       Risk Assessment/Calculations:    CHA2DS2-VASc Score = 5   This indicates a 7.2% annual risk of stroke. The patient's score is based upon: CHF History: 1 HTN History: 0 Diabetes History: 1 Stroke History: 0 Vascular Disease History: 1 Age Score: 2 Gender Score: 0          Physical Exam:    VS:  BP 124/76   Pulse 78   Ht 6\' 2"  (1.88 m)   Wt 200 lb (90.7 kg)   SpO2 96%   BMI 25.68 kg/m     Wt Readings from Last 3 Encounters:  07/14/23 200 lb (90.7 kg)  06/30/23 200 lb (90.7 kg)  06/22/23 196 lb 3.4 oz (89 kg)     GEN: Well nourished, well developed in no acute distress NECK: No JVD CARDIAC: RRR, soft flow murmur. No rubs, gallops RESPIRATORY:  Clear to auscultation without rales, wheezing or rhonchi  ABDOMEN: Soft, non-tender, non-distended EXTREMITIES:  No edema; No deformity.    ASSESSMENT:    1. S/P TAVR (transcatheter aortic valve replacement)   2. Chronic heart failure with preserved ejection fraction (HFpEF) (HCC)   3. Coronary artery disease involving native coronary artery of native heart with angina pectoris (HCC)   4. Paroxysmal atrial fibrillation (HCC)   5. Thoracic aortic aneurysm without rupture, unspecified part (HCC)  6. Primary hypertension   7. Hyperlipidemia, unspecified hyperlipidemia type   8. ILD (interstitial lung disease) (HCC)      PLAN:    In order of problems listed above:  Severe AS s/p TAVR:  -- EF today showed EF 65%, normally functioning TAVR with a mean gradient of 9 mmHg and no PVL as well as moderate  MAC and mild MR. -- He has NYHA class II symptoms with continues dyspnea that is likely multifactorial.  -- SBE prophylaxis discussed; he has amoxicillin. .  -- Continue Eliquis 5mg  BID. -- I will see back for 1 year echo and OV.  Chronic HFpEF -- Appears euvolemic.  -- Continue Jardiance 12.5mg  daily.  -- Continue on Lasix 2x a week (m,th).   CAD -- History of multiple interventions in the past including prior stenting to RCA and LAD. Last intervention was PTCA with kissing balloon technique of in-stent restenosis of LAD-D2 bifurcation site in 06/2021.  -- R/LHC on 05/27/2023 showed widely patent stents to LAD and RCA with 50% in-stent restenosis of mid LAD at previous PTCA site. -- Continue Ranexa 1000mg  twice daily. -- No aspirin due to need for Eliquis. -- Continue Crestor 40mg  daily. -- Continue PRN SL NTG.   Paroxysmal Atrial Fibrillation/ Flutter -- S/p atrial flutter ablation in 2006 and atrial fibrillation ablation in 01/2014. -- Not on any AV nodal agents. -- Continue Eliquis 5mg  BID.   TAA: -- 4.0cm. Noted on pre TAVR scans -- This will be followed over time by Dr. Herbie Baltimore.    Hypertension -- BP well controlled without any antihypertensives.   Hyperlipidemia -- Continue Crestor 40mg  daily.   Interstitial Lung Disease -- Continue Prednisone 5mg  daily.   Medication Adjustments/Labs and Tests Ordered: Current medicines are reviewed at length with the patient today.  Concerns regarding medicines are outlined above.  No orders of the defined types were placed in this encounter.  No orders of the defined types were placed in this encounter.   Patient Instructions  Medication Instructions:  Your physician recommends that you continue on your current medications as directed. Please refer to the Current Medication list given to you today.  *If you need a refill on your cardiac medications before your next appointment, please call your pharmacy*  Lab Work: none If  you have labs (blood work) drawn today and your tests are completely normal, you will receive your results only by: MyChart Message (if you have MyChart) OR A paper copy in the mail If you have any lab test that is abnormal or we need to change your treatment, we will call you to review the results.  Testing/Procedures: none  Follow-Up: At [facility], you and your health needs are our priority.  As part of our continuing mission to provide you with exceptional heart care, our providers are all part of one team.  This team includes your primary Cardiologist (physician) and Advanced Practice Providers or APPs (Physician Assistants and Nurse Practitioners) who all work together to provide you with the care you need, when you need it.  Your next appointment:   Keep scheduled follow-up  We recommend signing up for the patient portal called "MyChart".  Sign up information is provided on this After Visit Summary.  MyChart is used to connect with patients for Virtual Visits (Telemedicine).  Patients are able to view lab/test results, encounter notes, upcoming appointments, etc.  Non-urgent messages can be sent to your provider as well.   To learn more about what you  can do with MyChart, go to [email or web address].   Other Instructions       1st Floor: - Lobby - Registration  - Pharmacy  - Lab - Cafe  2nd Floor: - PV Lab - Diagnostic Testing (echo, CT, nuclear med)  3rd Floor: - Vacant  4th Floor: - TCTS (cardiothoracic surgery) - AFib Clinic - Structural Heart Clinic - Vascular Surgery  - Vascular Ultrasound  5th Floor: - HeartCare Cardiology (general and EP) - Clinical Pharmacy for coumadin, hypertension, lipid, weight-loss medications, and med management appointments    Valet parking services will be available as well.     Signed, Cline Crock, PA-C  07/14/2023 6:49 PM    [facility]

## 2023-07-14 ENCOUNTER — Ambulatory Visit: Attending: Internal Medicine

## 2023-07-14 ENCOUNTER — Ambulatory Visit (INDEPENDENT_AMBULATORY_CARE_PROVIDER_SITE_OTHER): Admitting: Physician Assistant

## 2023-07-14 ENCOUNTER — Other Ambulatory Visit (HOSPITAL_COMMUNITY): Payer: Self-pay | Admitting: Physician Assistant

## 2023-07-14 VITALS — BP 124/76 | HR 78 | Ht 74.0 in | Wt 200.0 lb

## 2023-07-14 DIAGNOSIS — E785 Hyperlipidemia, unspecified: Secondary | ICD-10-CM | POA: Diagnosis not present

## 2023-07-14 DIAGNOSIS — I480 Paroxysmal atrial fibrillation: Secondary | ICD-10-CM | POA: Diagnosis not present

## 2023-07-14 DIAGNOSIS — J849 Interstitial pulmonary disease, unspecified: Secondary | ICD-10-CM | POA: Insufficient documentation

## 2023-07-14 DIAGNOSIS — Z7952 Long term (current) use of systemic steroids: Secondary | ICD-10-CM | POA: Diagnosis not present

## 2023-07-14 DIAGNOSIS — I25119 Atherosclerotic heart disease of native coronary artery with unspecified angina pectoris: Secondary | ICD-10-CM

## 2023-07-14 DIAGNOSIS — I110 Hypertensive heart disease with heart failure: Secondary | ICD-10-CM | POA: Diagnosis not present

## 2023-07-14 DIAGNOSIS — I712 Thoracic aortic aneurysm, without rupture, unspecified: Secondary | ICD-10-CM | POA: Diagnosis not present

## 2023-07-14 DIAGNOSIS — L400 Psoriasis vulgaris: Secondary | ICD-10-CM | POA: Diagnosis not present

## 2023-07-14 DIAGNOSIS — Z955 Presence of coronary angioplasty implant and graft: Secondary | ICD-10-CM | POA: Diagnosis not present

## 2023-07-14 DIAGNOSIS — Z7901 Long term (current) use of anticoagulants: Secondary | ICD-10-CM | POA: Diagnosis not present

## 2023-07-14 DIAGNOSIS — Z952 Presence of prosthetic heart valve: Secondary | ICD-10-CM | POA: Insufficient documentation

## 2023-07-14 DIAGNOSIS — Z09 Encounter for follow-up examination after completed treatment for conditions other than malignant neoplasm: Secondary | ICD-10-CM | POA: Insufficient documentation

## 2023-07-14 DIAGNOSIS — E119 Type 2 diabetes mellitus without complications: Secondary | ICD-10-CM | POA: Insufficient documentation

## 2023-07-14 DIAGNOSIS — I5032 Chronic diastolic (congestive) heart failure: Secondary | ICD-10-CM

## 2023-07-14 DIAGNOSIS — I10 Essential (primary) hypertension: Secondary | ICD-10-CM

## 2023-07-14 LAB — ECHOCARDIOGRAM COMPLETE
AR max vel: 1.98 cm2
AV Area VTI: 2 cm2
AV Area mean vel: 1.94 cm2
AV Mean grad: 8.4 mmHg
AV Peak grad: 15.3 mmHg
Ao pk vel: 1.96 m/s
Area-P 1/2: 2.06 cm2
S' Lateral: 2.8 cm

## 2023-07-14 NOTE — Patient Instructions (Signed)
 Medication Instructions:  Your physician recommends that you continue on your current medications as directed. Please refer to the Current Medication list given to you today.  *If you need a refill on your cardiac medications before your next appointment, please call your pharmacy*  Lab Work: none If you have labs (blood work) drawn today and your tests are completely normal, you will receive your results only by: MyChart Message (if you have MyChart) OR A paper copy in the mail If you have any lab test that is abnormal or we need to change your treatment, we will call you to review the results.  Testing/Procedures: none  Follow-Up: At [facility] you and your health needs are our priority.  As part of our continuing mission to provide you with exceptional heart care, our providers are all part of one team.  This team includes your primary Cardiologist (physician) and Advanced Practice Providers or APPs (Physician Assistants and Nurse Practitioners) who all work together to provide you with the care you need, when you need it.  Your next appointment:   Keep scheduled follow-up  We recommend signing up for the patient portal called "MyChart".  Sign up information is provided on this After Visit Summary.  MyChart is used to connect with patients for Virtual Visits (Telemedicine).  Patients are able to view lab/test results, encounter notes, upcoming appointments, etc.  Non-urgent messages can be sent to your provider as well.   To learn more about what you can do with MyChart, go to [email or web address].   Other Instructions       1st Floor: - Lobby - Registration  - Pharmacy  - Lab - Cafe  2nd Floor: - PV Lab - Diagnostic Testing (echo, CT, nuclear med)  3rd Floor: - Vacant  4th Floor: - TCTS (cardiothoracic surgery) - AFib Clinic - Structural Heart Clinic - Vascular Surgery  - Vascular Ultrasound  5th Floor: - HeartCare Cardiology (general and EP) -  Clinical Pharmacy for coumadin, hypertension, lipid, weight-loss medications, and med management appointments    Valet parking services will be available as well.

## 2023-07-18 DIAGNOSIS — R310 Gross hematuria: Secondary | ICD-10-CM | POA: Diagnosis not present

## 2023-07-18 DIAGNOSIS — N401 Enlarged prostate with lower urinary tract symptoms: Secondary | ICD-10-CM | POA: Diagnosis not present

## 2023-07-18 DIAGNOSIS — N200 Calculus of kidney: Secondary | ICD-10-CM | POA: Diagnosis not present

## 2023-07-18 DIAGNOSIS — R350 Frequency of micturition: Secondary | ICD-10-CM | POA: Diagnosis not present

## 2023-08-28 NOTE — Progress Notes (Unsigned)
 Cardiology Office Note:  .   Date:  09/01/2023  ID:  Kenneth [name], DOB 08/14/1945, MRN [id number] PCP: Clinic, [facility] Providers Cardiologist:  Kenneth Bustard, MD     Chief Complaint  Patient presents with   Follow-up    Feels much better.  Still fatigued but improved.   Coronary Artery Disease    Not having any angina just exercise intolerance.   Cardiac Valve Problem    Follow-up TAVR valve echo looks great.  Reviewed.   Congestive Heart Failure    HFpEF with GR II DD on echo    Patient Profile: .     Kenneth [name] is a 78 y.o. male  with a PMH reviewed below who presents here for 52-month follow-up at the request [facility]  Kenneth [name] is a very pleasant 78 y.o. male with a PMH reviewed below-notable for CAD-PCI, PAF/atrial flutter s/p flutter ablation in 2006, A-fib ablation in 2015), Aortic Stenosis, and interstitial lung disease who presents here for telephone follow-up to discuss test results and plan based on recent echocardiogram showing progression of aortic stenosis from moderate to severe, he presents at the request of Kenneth [name] for PCCM.   He has been followed here at the request of Dr. Milda [name] from the [facility], however he is currently in the process of [facility] new [facility].  PERTINENT HISTORY: 2006: Atrial Flutter - s/p Ablation 01/2014: Afib Ablation (off OAC) 06/2016: CAD-PCI sx of DOE -> [facility] - mild Pulm HTN, Severe mRCA 95% (DES PCI), p-mLAD ~70% w/ FFR 0.75 (staged DES PCI). Co-dom LCx distal 60% & OM3 55%.  07/2016: Staged PCI LAD 10/11/2020 - [facility] - FFR guided DES PCI (PTCA ost D1); patent mRCA DES as well as p-m LAD. => m-d LAD 70% (after D1 - 60% ost D1) - FFR 0.75 -> Synergy DES 2.5 x 32 (post-dilation 3.7-2.6 mm), Ost D1 PTCA -> reduced to 40%. 07/04/2021 - [facility]: Significant ISR at LAD-D2 bifurcation site ->  PTCA with kissing balloon technique in D2.  Restored TIMI-3 flow reduce the LAD lesion to  25% and D2 40% Stress PET 03/2022:  NORMAL PERFUSION/LOW RISK.  Normal LVEF.  Suspected reduced myocardial blood flow reserve related to microvascular disease.  No infarction or ischemia.Kenneth [name]  Resting EF 48% and stress EF 54%.  Mild LV enlargement.  Stable mosaic attenuation in the lungs. Progression to Severe Aortic Stenosis by last Echo mean AVG 26 mmHg, by Cath 33 mmHg TAVR 06/10/2023 (29 mm SAPIEN)  Follow-up Echocardiogram (06/21/2023) showed mean AVG 8 mm 3 Interstitial Lung Disease on chronic steroids and Adalimumab   I last saw Kenneth [name] on June 30, 2023 for a quick ER follow-up and he was noting pretty significant exertional dyspnea.  Echo showed normal LVEF and normal functioning TAVR valve with only small pericardial effusion.  I increased Ranexa  with a thought that may be microvascular perfusion with him.Kenneth [name]  He was just seen on July 14, 2018 TAVR Kenneth Hoar, PA from [facility] for TAVR follow-up.  Noted that he had to stop to catch his breath walking back from his garage.  Trips to the grocery commute challenging.  Did not think that symptoms improved with Ranexa .  Denying chest pain.  NYHA class II symptoms noted with exertional dyspnea but likely multifactorial. => Discussed SBE prophylaxis with amoxicillin .  Was on 5 mg twice daily Eliquis  for his PAF.  Planned annual follow-up.  Appeared euvolemic on exam.  Was on Lasix  twice a week () Mondays and Thursdays) along with Jardiance  12.5 mg daily.  Subjective  Discussed the use of AI scribe software for clinical note transcription with the patient, who gave verbal consent to proceed.  History of Present Illness  Kenneth [name] is a 78 year old male with coronary artery disease and recent TAVR  who presents for close follow-up with concerns about stamina and recovery post-procedure, however he does note feeling significant improved compared to last visit.Kenneth [name] He is accompanied by his wife who helps keep him on track and  helps provide information.  He is experiencing improved symptoms following his valve replacement procedure but continues to have easy fatigue and reduced stamina. He can walk but at a slower pace and sometimes uses a cane for stability, especially when tired, although he prefers not to rely on it. He engages in activities like making pens but requires frequent rest.  Prior to the valve replacement, he was unable to walk down the hallway without experiencing shortness of breath. Post-procedure, he can walk but at a slower pace. He sometimes uses a cane for stability, especially when tired, but prefers not to rely on it.  He is currently taking Ranexa  (ranolazine ) at a dose of 1000 mg, which was increased from one in the morning and one at night to two in the morning and one at night. He also takes Crestor  (rosuvastatin ) 40 mg, Eliquis  (apixaban ) 5 mg twice a day, Jardiance  (empagliflozin ) 12.5 mg, and metformin  500 mg. He uses amoxicillin  as needed for procedures.  He has a history of coronary artery disease with stent placements. His last stress test was a PET test, which indicated low risk from a large artery standpoint but showed microvascular disease. He is on Ranexa  to improve microvascular circulation.  He has a history of memory loss for which he takes galantamine  and Namenda  (memantine ). His wife assists him by texting him lists for shopping to help manage his memory issues.  He has a history of psoriasis and receives Humira injections through the [place] due to cost considerations. He also utilizes [place] services for dermatology, glasses, and hearing aids. Cardiovascular ROS: positive for - dyspnea on exertion and fatigue -> still with exercise intolerance, but notably improved. negative for - chest pain, edema, irregular heartbeat, orthopnea, palpitations, paroxysmal nocturnal dyspnea, rapid heart rate, shortness of breath, or syncope or near syncope, TIA or amaurosis fugax, claudication      Objective    Studies Reviewed: Kenneth [name]        Lab Results  Component Value Date   CHOL 176 05/22/2023   HDL 49 05/22/2023   LDLCALC 80 05/22/2023   TRIG 286 (H) 05/22/2023   CHOLHDL 3.6 05/22/2023   Lab Results  Component Value Date   HGBA1C 6.3 (H) 05/22/2023    Lab Results  Component Value Date   NA 140 06/22/2023   K 3.6 06/22/2023   CREATININE 1.27 (H) 06/22/2023   GFRNONAA 58 (L) 06/22/2023   GLUCOSE 112 (H) 06/22/2023      Latest Ref Rng & Units 06/22/2023    4:13 AM 06/21/2023    3:46 PM 06/11/2023    3:35 AM  CBC  WBC 4.0 - 10.5 K/uL 11.2  11.7  8.5   Hemoglobin 13.0 - 17.0 g/dL 65.7  84.6  96.2   Hematocrit 39.0 - 52.0 % 37.8  39.5  38.6   Platelets 150 - 400 K/uL 177  186  167  ECHO 07/14/2023: Normal LVEF at 65 to 70%.  No RWMA.  Mild LVH with GR 2 DD-elevated LAP.  Moderate right and left atrial dilation.  Mild MAC.  No significant MS or MR.  29 mm Edwards S3UR TAVR valve functioning well.  Mean AVG 9 mmHg.  Normal RAP.  Risk Assessment/Calculations:    CHA2DS2-VASc Score = 5   This indicates a 7.2% annual risk of stroke. The patient's score is based upon: CHF History: 1 HTN History: 0 Diabetes History: 1 Stroke History: 0 Vascular Disease History: 1 Age Score: 2 Gender Score: 0    On DOAC      Physical Exam:   VS:  BP 118/74 (BP Location: Left Arm, Patient Position: Sitting)   Pulse 71   Ht 6\' 2"  (1.88 m)   Wt 205 lb 6.4 oz (93.2 kg)   SpO2 98%   BMI 26.37 kg/m    Wt Readings from Last 3 Encounters:  08/29/23 205 lb 6.4 oz (93.2 kg)  07/14/23 200 lb (90.7 kg)  06/30/23 200 lb (90.7 kg)    GEN: Robust/healthy appearing.  Well nourished, well groomed in no acute distress; looks much less fatigued. NECK: No JVD; No carotid bruits CARDIAC: Normal S1, S2; RRR, minimal if any murmur with no rubs or gallops.   RESPIRATORY:  Clear to auscultation without rales, wheezing or rhonchi ; nonlabored, good air movement. ABDOMEN: Soft, non-tender,  non-distended EXTREMITIES:  No edema; No deformity      ASSESSMENT AND PLAN: .    Problem List Items Addressed This Visit       Cardiology Problems   Atypical atrial flutter (HCC) (Chronic)   Status post redo ablation in 2015.  Remains on DOAC because of recurrence of PAF      CAD S/P percutaneous coronary angioplasty (Chronic)   Widely patent proximal LAD stent but the distal overlap has roughly 50% ISR along with stenosis of the ostial jailed diagonal branch.  Continue to monitor for symptoms with low threshold to proceed with stress PET testing to assess for macro or microvascular ischemia.  Not on aspirin  Plavix  because of Eliquis .      Chronic heart failure with preserved ejection fraction (HFpEF) (HCC) (Chronic)   GR 2 DD noted on echo and he has had intermittent edema.  I suspect a lot of the diastolic function is related to the progression of valvular heart disease. At present, GDMT limited because of low blood pressure and fatigue.  Holding off on ARB to avoid hypotension and beta-blocker to avoid both hypotension and fatigue.  - Continue Jardiance  12.5 mg daily, and potentially consider spironolactone 12.5-25 mg -He is on a steady dose of furosemide  twice a week.  We discussed indications for additional dosing based on sliding scale (weight gain> 3 pounds in 1 day or> 5 pounds in a week; in addition to progressively worsening dyspnea or edema)      Coronary artery disease involving native coronary artery with angina pectoris (HCC) - Primary (Chronic)   His anginal symptoms were usually pretty atypical and not necessary chest tightness but mostly profound fatigue and dyspnea.  I was concerned my last saw him that he was having pretty significant exercise intolerance that was similar to when I last did PCI.  He does have in-stent restenosis of the LAD at the site overlapping distal stent diagonal takeoff.  It did not appear to be flow-limiting on pre-TAVR cath.  At present,  with no active anginal symptoms we will hold off  on ischemic evaluation, but low threshold to consider Stress PET which would help us  evaluate for either macrovascular but also potential microvascular angina.  Symptoms improved with further titration of Ranexa  to 1000 mg twice daily.  He is now getting this from the [facility] pharmacy since the [place] would not approve and recommended switching to Imdur which is not indicated for microvascular ischemia. - Continue Ranexa  1000 mg twice daily - Continue rosuvastatin  40 mg daily - Not on aspirin  or Plavix  because of use of Eliquis  for A-fib - Continue Jardiance  12.5 mg daily ([facility] dosing of 1/2 of 25 mg tablet)          Hypercoagulable state due to paroxysmal atrial fibrillation (HCC); CHA2DS2-VASc score 5 (Chronic)   Elevated CHA2DS2-VASc score with history of PAF. Remains on Eliquis . Okay to hold Eliquis  2 to 3 days preop for surgical procedures.      Hyperlipidemia associated with type 2 diabetes mellitus (HCC) (Chronic)   Hyperlipidemia with elevated triglycerides and LDL not at target.  (80) Focus on managing lipid levels to reduce cardiovascular risk. - Continue current rosuvastatin  40 mg daily for now-once he is fully recovered from TAVR, can consider additional medications which would likely include Nexlizet  Is on Jardiance  25 mg along with metformin  500 mg nightly for diabetes -> most recent A1c 6.3.      Hypertension associated with diabetes (HCC) (Chronic)   BP remains stable-has not required any antihypertensives recently.  In fact has had low blood pressures.  Continue to monitor.      Microvascular angina (HCC) (Chronic)   Microvascular angina with improved symptoms on increased ranolazine  dosage. Ranolazine  chosen for efficacy in enhancing microvascular circulation without affecting blood pressure. - Continue ranolazine  at 1000 mg daily. - Ensure prescription is filled at [facility] due to [place] refusal.      Paroxysmal atrial  fibrillation (HCC) (Chronic)   Maintaining sinus rhythm.  Has not had breakthrough spells that he knows about. No longer on rate control due to concerns about exercise intolerance and fatigue. Rate appears to be adequately controlled without AV nodal agents. -Continues on Eliquis  5 mg twice daily        Other   Exertional dyspnea (Chronic)   Memory loss   Memory loss managed with galantamine  and memantine  to slow cognitive decline. - Continue galantamine  and memantine  as prescribed.  May need to consider holding rosuvastatin  to see if there is any improvement.  This would require consideration of PCSK9 inhibitor.      S/P TAVR (transcatheter aortic valve replacement) (Chronic)   Post-TAVR with well-functioning valve. Symptomatic improvement noted.  Recovery gradual due to pre-procedure deconditioning. - Continue current management/monitoring-anticipate Echo in 1 year. - Encourage use of assistive devices like a cane for stability during prolonged activities. -Discussed SBE prophylaxis-as amoxicillin  dose ordered       As    Follow-Up: Return in about 7 months (around 03/30/2024) for Routine follow up with me, [employer].  Recording duration: 38 minutes Total time spent: 38 min spent with patient + 23 min spent charting = 61 min I spent 61 minutes in the care of Kenneth [name] today including reviewing labs (1 minute), reviewing studies (echo images and report reviewed-3 minutes), face to face time discussing treatment options (38 minutes), reviewing records from last note and APP's note (4 minutes), 15 minutes dictating, and documenting in the encounter.     Signed, Arleen Lacer, MD, MS Kenneth [name], M.D., M.S. Interventional Cardiologist  [employer] Teacher, English as a foreign language  Pager # [phone]

## 2023-08-29 ENCOUNTER — Encounter: Payer: Self-pay | Admitting: Cardiology

## 2023-08-29 ENCOUNTER — Ambulatory Visit: Attending: Cardiology | Admitting: Cardiology

## 2023-08-29 VITALS — BP 118/74 | HR 71 | Ht 74.0 in | Wt 205.4 lb

## 2023-08-29 DIAGNOSIS — I484 Atypical atrial flutter: Secondary | ICD-10-CM | POA: Insufficient documentation

## 2023-08-29 DIAGNOSIS — R0609 Other forms of dyspnea: Secondary | ICD-10-CM | POA: Insufficient documentation

## 2023-08-29 DIAGNOSIS — I25119 Atherosclerotic heart disease of native coronary artery with unspecified angina pectoris: Secondary | ICD-10-CM | POA: Diagnosis not present

## 2023-08-29 DIAGNOSIS — D6869 Other thrombophilia: Secondary | ICD-10-CM | POA: Insufficient documentation

## 2023-08-29 DIAGNOSIS — I2089 Other forms of angina pectoris: Secondary | ICD-10-CM | POA: Diagnosis not present

## 2023-08-29 DIAGNOSIS — Z952 Presence of prosthetic heart valve: Secondary | ICD-10-CM | POA: Diagnosis not present

## 2023-08-29 DIAGNOSIS — R413 Other amnesia: Secondary | ICD-10-CM | POA: Insufficient documentation

## 2023-08-29 DIAGNOSIS — Z9861 Coronary angioplasty status: Secondary | ICD-10-CM | POA: Insufficient documentation

## 2023-08-29 DIAGNOSIS — I2510 Atherosclerotic heart disease of native coronary artery without angina pectoris: Secondary | ICD-10-CM | POA: Diagnosis not present

## 2023-08-29 DIAGNOSIS — E785 Hyperlipidemia, unspecified: Secondary | ICD-10-CM | POA: Insufficient documentation

## 2023-08-29 DIAGNOSIS — E1159 Type 2 diabetes mellitus with other circulatory complications: Secondary | ICD-10-CM | POA: Insufficient documentation

## 2023-08-29 DIAGNOSIS — I5032 Chronic diastolic (congestive) heart failure: Secondary | ICD-10-CM | POA: Insufficient documentation

## 2023-08-29 DIAGNOSIS — E1169 Type 2 diabetes mellitus with other specified complication: Secondary | ICD-10-CM | POA: Insufficient documentation

## 2023-08-29 DIAGNOSIS — I480 Paroxysmal atrial fibrillation: Secondary | ICD-10-CM | POA: Insufficient documentation

## 2023-08-29 DIAGNOSIS — I152 Hypertension secondary to endocrine disorders: Secondary | ICD-10-CM | POA: Diagnosis not present

## 2023-08-29 NOTE — Patient Instructions (Addendum)
 Medication Instructions:   No changes *If you need a refill on your cardiac medications before your next appointment, please call your pharmacy*   Lab Work: Not needed    Testing/Procedures:  Not needed  Follow-Up: At [facility], you and your health needs are our priority.  As part of our continuing mission to provide you with exceptional heart care, we have created designated Provider Care Teams.  These Care Teams include your primary Cardiologist (physician) and Advanced Practice Providers (APPs -  Physician Assistants and Nurse Practitioners) who all work together to provide you with the care you need, when you need it.     Your next appointment:   6 to 7 month(s) late Nov or Dec 2025  The format for your next appointment:   In Person  Provider:   Randene Bustard, MD

## 2023-09-01 ENCOUNTER — Encounter: Payer: Self-pay | Admitting: Cardiology

## 2023-09-01 DIAGNOSIS — R413 Other amnesia: Secondary | ICD-10-CM | POA: Insufficient documentation

## 2023-09-01 NOTE — Assessment & Plan Note (Signed)
 Microvascular angina with improved symptoms on increased ranolazine  dosage. Ranolazine  chosen for efficacy in enhancing microvascular circulation without affecting blood pressure. - Continue ranolazine  at 1000 mg daily. - Ensure prescription is filled at [facility] due to [place] refusal.

## 2023-09-01 NOTE — Assessment & Plan Note (Signed)
 Hyperlipidemia with elevated triglycerides and LDL not at target.  (80) Focus on managing lipid levels to reduce cardiovascular risk. - Continue current rosuvastatin  40 mg daily for now-once he is fully recovered from TAVR, can consider additional medications which would likely include Nexlizet  Is on Jardiance  25 mg along with metformin  500 mg nightly for diabetes -> most recent A1c 6.3.

## 2023-09-01 NOTE — Assessment & Plan Note (Addendum)
 Memory loss managed with galantamine  and memantine  to slow cognitive decline. - Continue galantamine  and memantine  as prescribed.  May need to consider holding rosuvastatin  to see if there is any improvement.  This would require consideration of PCSK9 inhibitor.

## 2023-09-01 NOTE — Assessment & Plan Note (Signed)
 Maintaining sinus rhythm.  Has not had breakthrough spells that he knows about. No longer on rate control due to concerns about exercise intolerance and fatigue. Rate appears to be adequately controlled without AV nodal agents. -Continues on Eliquis  5 mg twice daily

## 2023-09-01 NOTE — Assessment & Plan Note (Signed)
 GR 2 DD noted on echo and he has had intermittent edema.  I suspect a lot of the diastolic function is related to the progression of valvular heart disease. At present, GDMT limited because of low blood pressure and fatigue.  Holding off on ARB to avoid hypotension and beta-blocker to avoid both hypotension and fatigue.  - Continue Jardiance  12.5 mg daily, and potentially consider spironolactone 12.5-25 mg -He is on a steady dose of furosemide  twice a week.  We discussed indications for additional dosing based on sliding scale (weight gain> 3 pounds in 1 day or> 5 pounds in a week; in addition to progressively worsening dyspnea or edema)

## 2023-09-01 NOTE — Assessment & Plan Note (Signed)
 Widely patent proximal LAD stent but the distal overlap has roughly 50% ISR along with stenosis of the ostial jailed diagonal branch.  Continue to monitor for symptoms with low threshold to proceed with stress PET testing to assess for macro or microvascular ischemia.  Not on aspirin  Plavix  because of Eliquis .

## 2023-09-01 NOTE — Assessment & Plan Note (Signed)
 BP remains stable-has not required any antihypertensives recently.  In fact has had low blood pressures.  Continue to monitor.

## 2023-09-01 NOTE — Assessment & Plan Note (Signed)
 Elevated CHA2DS2-VASc score with history of PAF. Remains on Eliquis . Okay to hold Eliquis  2 to 3 days preop for surgical procedures.

## 2023-09-01 NOTE — Assessment & Plan Note (Signed)
 His anginal symptoms were usually pretty atypical and not necessary chest tightness but mostly profound fatigue and dyspnea.  I was concerned my last saw him that he was having pretty significant exercise intolerance that was similar to when I last did PCI.  He does have in-stent restenosis of the LAD at the site overlapping distal stent diagonal takeoff.  It did not appear to be flow-limiting on pre-TAVR cath.  At present, with no active anginal symptoms we will hold off on ischemic evaluation, but low threshold to consider Stress PET which would help us  evaluate for either macrovascular but also potential microvascular angina.  Symptoms improved with further titration of Ranexa  to 1000 mg twice daily.  He is now getting this from the [facility] since the [place] would not approve and recommended switching to Imdur which is not indicated for microvascular ischemia. - Continue Ranexa  1000 mg twice daily - Continue rosuvastatin  40 mg daily - Not on aspirin  or Plavix  because of use of Eliquis  for A-fib - Continue Jardiance  12.5 mg daily ([facility] dosing of 1/2 of 25 mg tablet)

## 2023-09-01 NOTE — Assessment & Plan Note (Signed)
 Status post redo ablation in 2015.  Remains on DOAC because of recurrence of PAF

## 2023-09-01 NOTE — Assessment & Plan Note (Signed)
 Post-TAVR with well-functioning valve. Symptomatic improvement noted.  Recovery gradual due to pre-procedure deconditioning. - Continue current management/monitoring-anticipate Echo in 1 year. - Encourage use of assistive devices like a cane for stability during prolonged activities. -Discussed SBE prophylaxis-as amoxicillin  dose ordered

## 2023-09-29 ENCOUNTER — Other Ambulatory Visit: Payer: Self-pay | Admitting: Cardiology

## 2023-09-29 DIAGNOSIS — N200 Calculus of kidney: Secondary | ICD-10-CM | POA: Diagnosis not present

## 2023-09-29 DIAGNOSIS — N401 Enlarged prostate with lower urinary tract symptoms: Secondary | ICD-10-CM | POA: Diagnosis not present

## 2023-09-29 DIAGNOSIS — R3121 Asymptomatic microscopic hematuria: Secondary | ICD-10-CM | POA: Diagnosis not present

## 2023-09-29 DIAGNOSIS — R3915 Urgency of urination: Secondary | ICD-10-CM | POA: Diagnosis not present

## 2023-09-29 MED ORDER — RANOLAZINE ER 500 MG PO TB12: ORAL_TABLET | ORAL | 3 refills | Status: DC

## 2023-10-17 ENCOUNTER — Ambulatory Visit (HOSPITAL_BASED_OUTPATIENT_CLINIC_OR_DEPARTMENT_OTHER)

## 2023-10-24 ENCOUNTER — Emergency Department (HOSPITAL_COMMUNITY)

## 2023-10-24 ENCOUNTER — Encounter (HOSPITAL_COMMUNITY): Payer: Self-pay

## 2023-10-24 ENCOUNTER — Other Ambulatory Visit: Payer: Self-pay

## 2023-10-24 ENCOUNTER — Inpatient Hospital Stay (HOSPITAL_COMMUNITY)

## 2023-10-24 ENCOUNTER — Inpatient Hospital Stay (HOSPITAL_COMMUNITY)
Admission: EM | Admit: 2023-10-24 | Discharge: 2023-11-07 | DRG: 064 | Disposition: E | Attending: Neurology | Admitting: Neurology

## 2023-10-24 DIAGNOSIS — I4891 Unspecified atrial fibrillation: Secondary | ICD-10-CM

## 2023-10-24 DIAGNOSIS — S42032A Displaced fracture of lateral end of left clavicle, initial encounter for closed fracture: Secondary | ICD-10-CM | POA: Diagnosis not present

## 2023-10-24 DIAGNOSIS — I2510 Atherosclerotic heart disease of native coronary artery without angina pectoris: Secondary | ICD-10-CM | POA: Diagnosis present

## 2023-10-24 DIAGNOSIS — Y92002 Bathroom of unspecified non-institutional (private) residence single-family (private) house as the place of occurrence of the external cause: Secondary | ICD-10-CM

## 2023-10-24 DIAGNOSIS — M4802 Spinal stenosis, cervical region: Secondary | ICD-10-CM | POA: Diagnosis not present

## 2023-10-24 DIAGNOSIS — G935 Compression of brain: Secondary | ICD-10-CM | POA: Diagnosis not present

## 2023-10-24 DIAGNOSIS — I609 Nontraumatic subarachnoid hemorrhage, unspecified: Secondary | ICD-10-CM | POA: Diagnosis present

## 2023-10-24 DIAGNOSIS — W19XXXA Unspecified fall, initial encounter: Secondary | ICD-10-CM | POA: Diagnosis not present

## 2023-10-24 DIAGNOSIS — R414 Neurologic neglect syndrome: Secondary | ICD-10-CM | POA: Diagnosis not present

## 2023-10-24 DIAGNOSIS — I161 Hypertensive emergency: Secondary | ICD-10-CM | POA: Diagnosis not present

## 2023-10-24 DIAGNOSIS — I619 Nontraumatic intracerebral hemorrhage, unspecified: Principal | ICD-10-CM

## 2023-10-24 DIAGNOSIS — S59912A Unspecified injury of left forearm, initial encounter: Secondary | ICD-10-CM | POA: Diagnosis not present

## 2023-10-24 DIAGNOSIS — M25519 Pain in unspecified shoulder: Secondary | ICD-10-CM | POA: Diagnosis not present

## 2023-10-24 DIAGNOSIS — Z515 Encounter for palliative care: Secondary | ICD-10-CM | POA: Diagnosis not present

## 2023-10-24 DIAGNOSIS — L4050 Arthropathic psoriasis, unspecified: Secondary | ICD-10-CM | POA: Diagnosis present

## 2023-10-24 DIAGNOSIS — T80818A Extravasation of other vesicant agent, initial encounter: Secondary | ICD-10-CM | POA: Diagnosis present

## 2023-10-24 DIAGNOSIS — S4991XA Unspecified injury of right shoulder and upper arm, initial encounter: Secondary | ICD-10-CM | POA: Diagnosis not present

## 2023-10-24 DIAGNOSIS — Z87891 Personal history of nicotine dependence: Secondary | ICD-10-CM

## 2023-10-24 DIAGNOSIS — E874 Mixed disorder of acid-base balance: Secondary | ICD-10-CM | POA: Diagnosis not present

## 2023-10-24 DIAGNOSIS — F419 Anxiety disorder, unspecified: Secondary | ICD-10-CM | POA: Diagnosis present

## 2023-10-24 DIAGNOSIS — S0003XA Contusion of scalp, initial encounter: Secondary | ICD-10-CM | POA: Diagnosis not present

## 2023-10-24 DIAGNOSIS — I611 Nontraumatic intracerebral hemorrhage in hemisphere, cortical: Secondary | ICD-10-CM

## 2023-10-24 DIAGNOSIS — S3991XA Unspecified injury of abdomen, initial encounter: Secondary | ICD-10-CM | POA: Diagnosis not present

## 2023-10-24 DIAGNOSIS — G919 Hydrocephalus, unspecified: Secondary | ICD-10-CM | POA: Diagnosis not present

## 2023-10-24 DIAGNOSIS — E119 Type 2 diabetes mellitus without complications: Secondary | ICD-10-CM | POA: Diagnosis not present

## 2023-10-24 DIAGNOSIS — R29732 NIHSS score 32: Secondary | ICD-10-CM | POA: Diagnosis present

## 2023-10-24 DIAGNOSIS — G8194 Hemiplegia, unspecified affecting left nondominant side: Secondary | ICD-10-CM | POA: Diagnosis present

## 2023-10-24 DIAGNOSIS — J9600 Acute respiratory failure, unspecified whether with hypoxia or hypercapnia: Secondary | ICD-10-CM | POA: Diagnosis not present

## 2023-10-24 DIAGNOSIS — I700 Atherosclerosis of aorta: Secondary | ICD-10-CM | POA: Diagnosis not present

## 2023-10-24 DIAGNOSIS — S066X0A Traumatic subarachnoid hemorrhage without loss of consciousness, initial encounter: Secondary | ICD-10-CM | POA: Diagnosis not present

## 2023-10-24 DIAGNOSIS — G936 Cerebral edema: Secondary | ICD-10-CM | POA: Diagnosis present

## 2023-10-24 DIAGNOSIS — R1310 Dysphagia, unspecified: Secondary | ICD-10-CM | POA: Diagnosis present

## 2023-10-24 DIAGNOSIS — S4992XA Unspecified injury of left shoulder and upper arm, initial encounter: Secondary | ICD-10-CM | POA: Diagnosis not present

## 2023-10-24 DIAGNOSIS — S8992XA Unspecified injury of left lower leg, initial encounter: Secondary | ICD-10-CM | POA: Diagnosis not present

## 2023-10-24 DIAGNOSIS — R918 Other nonspecific abnormal finding of lung field: Secondary | ICD-10-CM | POA: Diagnosis not present

## 2023-10-24 DIAGNOSIS — Z043 Encounter for examination and observation following other accident: Secondary | ICD-10-CM | POA: Diagnosis not present

## 2023-10-24 DIAGNOSIS — I5032 Chronic diastolic (congestive) heart failure: Secondary | ICD-10-CM | POA: Diagnosis not present

## 2023-10-24 DIAGNOSIS — M1712 Unilateral primary osteoarthritis, left knee: Secondary | ICD-10-CM | POA: Diagnosis not present

## 2023-10-24 DIAGNOSIS — D6489 Other specified anemias: Secondary | ICD-10-CM | POA: Diagnosis present

## 2023-10-24 DIAGNOSIS — Z7984 Long term (current) use of oral hypoglycemic drugs: Secondary | ICD-10-CM

## 2023-10-24 DIAGNOSIS — R29810 Facial weakness: Secondary | ICD-10-CM | POA: Diagnosis not present

## 2023-10-24 DIAGNOSIS — J9601 Acute respiratory failure with hypoxia: Secondary | ICD-10-CM | POA: Diagnosis not present

## 2023-10-24 DIAGNOSIS — I77819 Aortic ectasia, unspecified site: Secondary | ICD-10-CM | POA: Diagnosis not present

## 2023-10-24 DIAGNOSIS — E785 Hyperlipidemia, unspecified: Secondary | ICD-10-CM | POA: Diagnosis present

## 2023-10-24 DIAGNOSIS — R4781 Slurred speech: Secondary | ICD-10-CM | POA: Diagnosis not present

## 2023-10-24 DIAGNOSIS — I110 Hypertensive heart disease with heart failure: Secondary | ICD-10-CM | POA: Diagnosis present

## 2023-10-24 DIAGNOSIS — I639 Cerebral infarction, unspecified: Secondary | ICD-10-CM | POA: Diagnosis not present

## 2023-10-24 DIAGNOSIS — Z888 Allergy status to other drugs, medicaments and biological substances status: Secondary | ICD-10-CM

## 2023-10-24 DIAGNOSIS — F0390 Unspecified dementia without behavioral disturbance: Secondary | ICD-10-CM | POA: Diagnosis present

## 2023-10-24 DIAGNOSIS — Z85828 Personal history of other malignant neoplasm of skin: Secondary | ICD-10-CM

## 2023-10-24 DIAGNOSIS — I10 Essential (primary) hypertension: Secondary | ICD-10-CM | POA: Diagnosis not present

## 2023-10-24 DIAGNOSIS — S0634AA Traumatic hemorrhage of right cerebrum with loss of consciousness status unknown, initial encounter: Secondary | ICD-10-CM | POA: Diagnosis not present

## 2023-10-24 DIAGNOSIS — N1330 Unspecified hydronephrosis: Secondary | ICD-10-CM | POA: Diagnosis not present

## 2023-10-24 DIAGNOSIS — Z952 Presence of prosthetic heart valve: Secondary | ICD-10-CM

## 2023-10-24 DIAGNOSIS — Z66 Do not resuscitate: Secondary | ICD-10-CM | POA: Diagnosis not present

## 2023-10-24 DIAGNOSIS — Z79899 Other long term (current) drug therapy: Secondary | ICD-10-CM

## 2023-10-24 DIAGNOSIS — I480 Paroxysmal atrial fibrillation: Secondary | ICD-10-CM | POA: Diagnosis not present

## 2023-10-24 DIAGNOSIS — I615 Nontraumatic intracerebral hemorrhage, intraventricular: Principal | ICD-10-CM | POA: Diagnosis present

## 2023-10-24 DIAGNOSIS — R42 Dizziness and giddiness: Secondary | ICD-10-CM | POA: Diagnosis not present

## 2023-10-24 DIAGNOSIS — S3993XA Unspecified injury of pelvis, initial encounter: Secondary | ICD-10-CM | POA: Diagnosis not present

## 2023-10-24 DIAGNOSIS — Z885 Allergy status to narcotic agent status: Secondary | ICD-10-CM

## 2023-10-24 DIAGNOSIS — R4182 Altered mental status, unspecified: Secondary | ICD-10-CM | POA: Diagnosis not present

## 2023-10-24 DIAGNOSIS — S299XXA Unspecified injury of thorax, initial encounter: Secondary | ICD-10-CM | POA: Diagnosis not present

## 2023-10-24 DIAGNOSIS — R410 Disorientation, unspecified: Secondary | ICD-10-CM | POA: Diagnosis not present

## 2023-10-24 DIAGNOSIS — Z79620 Long term (current) use of immunosuppressive biologic: Secondary | ICD-10-CM

## 2023-10-24 DIAGNOSIS — Z4682 Encounter for fitting and adjustment of non-vascular catheter: Secondary | ICD-10-CM | POA: Diagnosis not present

## 2023-10-24 DIAGNOSIS — Z955 Presence of coronary angioplasty implant and graft: Secondary | ICD-10-CM

## 2023-10-24 DIAGNOSIS — M778 Other enthesopathies, not elsewhere classified: Secondary | ICD-10-CM | POA: Diagnosis not present

## 2023-10-24 DIAGNOSIS — R739 Hyperglycemia, unspecified: Secondary | ICD-10-CM | POA: Diagnosis not present

## 2023-10-24 DIAGNOSIS — M19011 Primary osteoarthritis, right shoulder: Secondary | ICD-10-CM | POA: Diagnosis not present

## 2023-10-24 DIAGNOSIS — G4733 Obstructive sleep apnea (adult) (pediatric): Secondary | ICD-10-CM | POA: Diagnosis present

## 2023-10-24 DIAGNOSIS — Z8249 Family history of ischemic heart disease and other diseases of the circulatory system: Secondary | ICD-10-CM

## 2023-10-24 DIAGNOSIS — I517 Cardiomegaly: Secondary | ICD-10-CM | POA: Diagnosis not present

## 2023-10-24 DIAGNOSIS — R29818 Other symptoms and signs involving the nervous system: Secondary | ICD-10-CM | POA: Diagnosis not present

## 2023-10-24 DIAGNOSIS — Z7901 Long term (current) use of anticoagulants: Secondary | ICD-10-CM | POA: Diagnosis not present

## 2023-10-24 DIAGNOSIS — J8489 Other specified interstitial pulmonary diseases: Secondary | ICD-10-CM | POA: Diagnosis present

## 2023-10-24 DIAGNOSIS — I69391 Dysphagia following cerebral infarction: Secondary | ICD-10-CM | POA: Diagnosis not present

## 2023-10-24 LAB — PROTIME-INR
INR: 1.8 — ABNORMAL HIGH (ref 0.8–1.2)
Prothrombin Time: 21.7 s — ABNORMAL HIGH (ref 11.4–15.2)

## 2023-10-24 LAB — POCT I-STAT 7, (LYTES, BLD GAS, ICA,H+H)
Acid-base deficit: 7 mmol/L — ABNORMAL HIGH (ref 0.0–2.0)
Bicarbonate: 20.6 mmol/L (ref 20.0–28.0)
Calcium, Ion: 1.3 mmol/L (ref 1.15–1.40)
HCT: 37 % — ABNORMAL LOW (ref 39.0–52.0)
Hemoglobin: 12.6 g/dL — ABNORMAL LOW (ref 13.0–17.0)
O2 Saturation: 100 %
Patient temperature: 98.2
Potassium: 3.6 mmol/L (ref 3.5–5.1)
Sodium: 154 mmol/L — ABNORMAL HIGH (ref 135–145)
TCO2: 22 mmol/L (ref 22–32)
pCO2 arterial: 49.6 mmHg — ABNORMAL HIGH (ref 32–48)
pH, Arterial: 7.226 — ABNORMAL LOW (ref 7.35–7.45)
pO2, Arterial: 525 mmHg — ABNORMAL HIGH (ref 83–108)

## 2023-10-24 LAB — DIFFERENTIAL
Abs Immature Granulocytes: 0.53 K/uL — ABNORMAL HIGH (ref 0.00–0.07)
Basophils Absolute: 0.1 K/uL (ref 0.0–0.1)
Basophils Relative: 1 %
Eosinophils Absolute: 0.3 K/uL (ref 0.0–0.5)
Eosinophils Relative: 3 %
Immature Granulocytes: 5 %
Lymphocytes Relative: 22 %
Lymphs Abs: 2.3 K/uL (ref 0.7–4.0)
Monocytes Absolute: 0.9 K/uL (ref 0.1–1.0)
Monocytes Relative: 8 %
Neutro Abs: 6.4 K/uL (ref 1.7–7.7)
Neutrophils Relative %: 61 %

## 2023-10-24 LAB — COMPREHENSIVE METABOLIC PANEL WITH GFR
ALT: 22 U/L (ref 0–44)
AST: 42 U/L — ABNORMAL HIGH (ref 15–41)
Albumin: 4.3 g/dL (ref 3.5–5.0)
Alkaline Phosphatase: 81 U/L (ref 38–126)
Anion gap: 13 (ref 5–15)
BUN: 10 mg/dL (ref 8–23)
CO2: 21 mmol/L — ABNORMAL LOW (ref 22–32)
Calcium: 9.8 mg/dL (ref 8.9–10.3)
Chloride: 105 mmol/L (ref 98–111)
Creatinine, Ser: 1.04 mg/dL (ref 0.61–1.24)
GFR, Estimated: >60 mL/min (ref >60)
Glucose, Bld: 153 mg/dL — ABNORMAL HIGH (ref 70–99)
Potassium: 3.9 mmol/L (ref 3.5–5.1)
Sodium: 139 mmol/L (ref 135–145)
Total Bilirubin: 1.1 mg/dL (ref 0.0–1.2)
Total Protein: >12 g/dL — ABNORMAL HIGH (ref 6.5–8.1)

## 2023-10-24 LAB — CBC
HCT: 40.8 % (ref 39.0–52.0)
Hemoglobin: 13.7 g/dL (ref 13.0–17.0)
MCH: 29 pg (ref 26.0–34.0)
MCHC: 33.6 g/dL (ref 30.0–36.0)
MCV: 86.4 fL (ref 80.0–100.0)
Platelets: 181 K/uL (ref 150–400)
RBC: 4.72 MIL/uL (ref 4.22–5.81)
RDW: 13.3 % (ref 11.5–15.5)
WBC: 10.6 K/uL — ABNORMAL HIGH (ref 4.0–10.5)
nRBC: 0.2 % (ref 0.0–0.2)

## 2023-10-24 LAB — GLUCOSE, CAPILLARY
Glucose-Capillary: 191 mg/dL — ABNORMAL HIGH (ref 70–99)
Glucose-Capillary: 219 mg/dL — ABNORMAL HIGH (ref 70–99)

## 2023-10-24 LAB — I-STAT CHEM 8, ED
BUN: 10 mg/dL (ref 8–23)
Calcium, Ion: 1.05 mmol/L — ABNORMAL LOW (ref 1.15–1.40)
Chloride: 111 mmol/L (ref 98–111)
Creatinine, Ser: 1.1 mg/dL (ref 0.61–1.24)
Glucose, Bld: 155 mg/dL — ABNORMAL HIGH (ref 70–99)
HCT: 41 % (ref 39.0–52.0)
Hemoglobin: 13.9 g/dL (ref 13.0–17.0)
Potassium: 4 mmol/L (ref 3.5–5.1)
Sodium: 140 mmol/L (ref 135–145)
TCO2: 22 mmol/L (ref 22–32)

## 2023-10-24 LAB — APTT: aPTT: 42 s — ABNORMAL HIGH (ref 24–36)

## 2023-10-24 LAB — SODIUM
Sodium: 141 mmol/L (ref 135–145)
Sodium: 145 mmol/L (ref 135–145)
Sodium: 152 mmol/L — ABNORMAL HIGH (ref 135–145)

## 2023-10-24 LAB — CBG MONITORING, ED: Glucose-Capillary: 163 mg/dL — ABNORMAL HIGH (ref 70–99)

## 2023-10-24 LAB — ETHANOL: Alcohol, Ethyl (B): <15 mg/dL (ref <15)

## 2023-10-24 MED ORDER — FENTANYL 2500MCG IN NS 250ML (10MCG/ML) PREMIX INFUSION
0.0000 ug/h | INTRAVENOUS | Status: DC
  Administered 2023-10-24: 25 ug/h via INTRAVENOUS
  Filled 2023-10-24: qty 250

## 2023-10-24 MED ORDER — ONDANSETRON HCL 4 MG/2ML IJ SOLN
INTRAMUSCULAR | Status: AC
  Filled 2023-10-24: qty 2

## 2023-10-24 MED ORDER — IOHEXOL 350 MG/ML SOLN
75.0000 mL | Freq: Once | INTRAVENOUS | Status: AC | PRN
  Administered 2023-10-24: 75 mL via INTRAVENOUS

## 2023-10-24 MED ORDER — ONDANSETRON HCL 4 MG/2ML IJ SOLN: 4.0000 mg | Freq: Once | INTRAMUSCULAR | Status: AC

## 2023-10-24 MED ORDER — STROKE: EARLY STAGES OF RECOVERY BOOK: Freq: Once | Status: DC

## 2023-10-24 MED ORDER — CLEVIDIPINE BUTYRATE 0.5 MG/ML IV EMUL
INTRAVENOUS | Status: AC
Start: 2023-10-24 — End: 2023-10-24
  Filled 2023-10-24: qty 50

## 2023-10-24 MED ORDER — ORAL CARE MOUTH RINSE: 15.0000 mL | OROMUCOSAL | Status: DC | PRN

## 2023-10-24 MED ORDER — SODIUM CHLORIDE 3% (HYPERTONIC SALINE) BOLUS VIA INFUSION: 250.0000 mL | Freq: Once | INTRAVENOUS | Status: DC

## 2023-10-24 MED ORDER — DOCUSATE SODIUM 100 MG PO CAPS: 100.0000 mg | ORAL_CAPSULE | Freq: Two times a day (BID) | ORAL | Status: DC | PRN

## 2023-10-24 MED ORDER — ETOMIDATE 2 MG/ML IV SOLN
20.0000 mg | Freq: Once | INTRAVENOUS | Status: AC
  Administered 2023-10-24: 20 mg via INTRAVENOUS
  Filled 2023-10-24: qty 10

## 2023-10-24 MED ORDER — ONDANSETRON HCL 4 MG/2ML IJ SOLN
4.0000 mg | Freq: Four times a day (QID) | INTRAMUSCULAR | Status: DC | PRN
  Administered 2023-10-24: 4 mg via INTRAVENOUS

## 2023-10-24 MED ORDER — PANTOPRAZOLE SODIUM 40 MG IV SOLR: 40.0000 mg | Freq: Every day | INTRAVENOUS | Status: DC

## 2023-10-24 MED ORDER — ACETAMINOPHEN 160 MG/5ML PO SOLN
650.0000 mg | ORAL | Status: DC | PRN
Start: 2023-10-24 — End: 2023-10-25
  Administered 2023-10-25: 650 mg
  Filled 2023-10-24: qty 20.3

## 2023-10-24 MED ORDER — FENTANYL CITRATE PF 50 MCG/ML IJ SOSY
50.0000 ug | PREFILLED_SYRINGE | Freq: Once | INTRAMUSCULAR | Status: AC
  Administered 2023-10-24: 50 ug via INTRAVENOUS
  Filled 2023-10-24: qty 1

## 2023-10-24 MED ORDER — SODIUM CHLORIDE 0.9% FLUSH
3.0000 mL | Freq: Once | INTRAVENOUS | Status: AC
  Administered 2023-10-24: 3 mL via INTRAVENOUS

## 2023-10-24 MED ORDER — ONDANSETRON HCL 4 MG/2ML IJ SOLN
INTRAMUSCULAR | Status: AC
  Administered 2023-10-24: 4 mg via INTRAVENOUS
  Filled 2023-10-24: qty 2

## 2023-10-24 MED ORDER — LABETALOL HCL 5 MG/ML IV SOLN
10.0000 mg | INTRAVENOUS | Status: DC | PRN
  Administered 2023-10-24 (×2): 10 mg via INTRAVENOUS
  Filled 2023-10-24 (×2): qty 4

## 2023-10-24 MED ORDER — ETOMIDATE 2 MG/ML IV SOLN
INTRAVENOUS | Status: AC
  Filled 2023-10-24: qty 10

## 2023-10-24 MED ORDER — SODIUM CHLORIDE 3 % IV BOLUS
250.0000 mL | Freq: Once | INTRAVENOUS | Status: AC
  Administered 2023-10-24: 250 mL via INTRAVENOUS
  Filled 2023-10-24: qty 500

## 2023-10-24 MED ORDER — PREDNISONE 5 MG PO TABS
5.0000 mg | ORAL_TABLET | Freq: Every day | ORAL | Status: DC
  Filled 2023-10-24: qty 1

## 2023-10-24 MED ORDER — HYDRALAZINE HCL 20 MG/ML IJ SOLN
10.0000 mg | INTRAMUSCULAR | Status: DC | PRN
Start: 2023-10-24 — End: 2023-10-25
  Administered 2023-10-24: 10 mg via INTRAVENOUS
  Filled 2023-10-24: qty 1

## 2023-10-24 MED ORDER — PHENYLEPHRINE 80 MCG/ML (10ML) SYRINGE FOR IV PUSH (FOR BLOOD PRESSURE SUPPORT)
PREFILLED_SYRINGE | INTRAVENOUS | Status: AC
  Filled 2023-10-24: qty 10

## 2023-10-24 MED ORDER — POLYETHYLENE GLYCOL 3350 17 G PO PACK: 17.0000 g | PACK | Freq: Every day | ORAL | Status: DC | PRN

## 2023-10-24 MED ORDER — SODIUM CHLORIDE 3 % IV SOLN
INTRAVENOUS | Status: DC
  Filled 2023-10-24 (×5): qty 500

## 2023-10-24 MED ORDER — SODIUM BICARBONATE 8.4 % IV SOLN
100.0000 meq | Freq: Once | INTRAVENOUS | Status: AC
  Administered 2023-10-25: 100 meq via INTRAVENOUS
  Filled 2023-10-24: qty 100
  Filled 2023-10-24: qty 50

## 2023-10-24 MED ORDER — ROCURONIUM BROMIDE 10 MG/ML (PF) SYRINGE
50.0000 mg | PREFILLED_SYRINGE | Freq: Once | INTRAVENOUS | Status: AC
  Administered 2023-10-24: 50 mg via INTRAVENOUS
  Filled 2023-10-24: qty 10

## 2023-10-24 MED ORDER — PROPOFOL 1000 MG/100ML IV EMUL
0.0000 ug/kg/min | INTRAVENOUS | Status: DC
  Administered 2023-10-24: 5 ug/kg/min via INTRAVENOUS
  Administered 2023-10-25: 25 ug/kg/min via INTRAVENOUS
  Administered 2023-10-25: 15 ug/kg/min via INTRAVENOUS
  Filled 2023-10-24 (×3): qty 100

## 2023-10-24 MED ORDER — SENNOSIDES-DOCUSATE SODIUM 8.6-50 MG PO TABS: 1.0000 | ORAL_TABLET | Freq: Two times a day (BID) | ORAL | Status: DC

## 2023-10-24 MED ORDER — PANTOPRAZOLE SODIUM 40 MG IV SOLR
40.0000 mg | Freq: Every day | INTRAVENOUS | Status: DC
  Administered 2023-10-24: 40 mg via INTRAVENOUS
  Filled 2023-10-24: qty 10

## 2023-10-24 MED ORDER — EMPTY CONTAINERS FLEXIBLE MISC
900.0000 mg | Freq: Once | Status: AC
  Administered 2023-10-24: 900 mg via INTRAVENOUS
  Filled 2023-10-24: qty 90

## 2023-10-24 MED ORDER — ONDANSETRON HCL 4 MG/2ML IJ SOLN: 4.0000 mg | Freq: Four times a day (QID) | INTRAMUSCULAR | Status: DC

## 2023-10-24 MED ORDER — CLEVIDIPINE BUTYRATE 0.5 MG/ML IV EMUL
0.0000 mg/h | INTRAVENOUS | Status: DC
  Administered 2023-10-24: 2 mg/h via INTRAVENOUS
  Administered 2023-10-24: 19 mg/h via INTRAVENOUS
  Administered 2023-10-24: 21 mg/h via INTRAVENOUS
  Administered 2023-10-24: 7 mg/h via INTRAVENOUS
  Administered 2023-10-24: 16 mg/h via INTRAVENOUS
  Administered 2023-10-24 (×2): 21 mg/h via INTRAVENOUS
  Administered 2023-10-24: 18 mg/h via INTRAVENOUS
  Administered 2023-10-25: 19 mg/h via INTRAVENOUS
  Administered 2023-10-25: 17 mg/h via INTRAVENOUS
  Administered 2023-10-25: 19 mg/h via INTRAVENOUS
  Administered 2023-10-25: 21 mg/h via INTRAVENOUS
  Administered 2023-10-25 (×2): 19 mg/h via INTRAVENOUS
  Administered 2023-10-25: 21 mg/h via INTRAVENOUS
  Filled 2023-10-24 (×2): qty 50
  Filled 2023-10-24 (×2): qty 100
  Filled 2023-10-24 (×2): qty 50
  Filled 2023-10-24: qty 100
  Filled 2023-10-24: qty 50
  Filled 2023-10-24: qty 100
  Filled 2023-10-24: qty 50
  Filled 2023-10-24 (×2): qty 100
  Filled 2023-10-24: qty 50

## 2023-10-24 MED ORDER — ORAL CARE MOUTH RINSE
15.0000 mL | OROMUCOSAL | Status: DC
  Administered 2023-10-24 – 2023-10-25 (×6): 15 mL via OROMUCOSAL

## 2023-10-24 MED ORDER — LABETALOL HCL 5 MG/ML IV SOLN
20.0000 mg | Freq: Once | INTRAVENOUS | Status: AC
  Administered 2023-10-24 (×2): 10 mg via INTRAVENOUS

## 2023-10-24 MED ORDER — LABETALOL HCL 5 MG/ML IV SOLN
INTRAVENOUS | Status: AC
  Filled 2023-10-24: qty 4

## 2023-10-24 MED ORDER — ACETAMINOPHEN 650 MG RE SUPP
650.0000 mg | RECTAL | Status: DC | PRN
  Administered 2023-10-24: 650 mg via RECTAL
  Filled 2023-10-24: qty 1

## 2023-10-24 MED ORDER — CHLORHEXIDINE GLUCONATE CLOTH 2 % EX PADS
6.0000 | MEDICATED_PAD | Freq: Every day | CUTANEOUS | Status: DC
  Administered 2023-10-24: 6 via TOPICAL

## 2023-10-24 MED ORDER — INSULIN ASPART 100 UNIT/ML IJ SOLN
0.0000 [IU] | INTRAMUSCULAR | Status: DC
  Administered 2023-10-24: 3 [IU] via SUBCUTANEOUS
  Administered 2023-10-25 (×2): 2 [IU] via SUBCUTANEOUS

## 2023-10-24 MED ORDER — ACETAMINOPHEN 325 MG PO TABS
650.0000 mg | ORAL_TABLET | ORAL | Status: DC | PRN
  Filled 2023-10-24: qty 2

## 2023-10-24 NOTE — Procedures (Signed)
 Intubation Procedure Note  Kenneth [name]  [id number]  1945-04-25  Date:10/24/23  Time:8:57 PM   Provider Performing:Marykatherine Sherwood [name]    Procedure: Intubation (31500)  Indication(s) Respiratory Failure  Consent Risks of the procedure as well as the alternatives and risks of each were explained to the patient and/or caregiver.  Consent for the procedure was obtained and is signed in the bedside chart   Anesthesia Etomidate, Fentanyl , and Rocuronium   Time Out Verified patient identification, verified procedure, site/side was marked, verified correct patient position, special equipment/implants available, medications/allergies/relevant history reviewed, required imaging and test results available.   Sterile Technique Usual hand hygeine, masks, and gloves were used   Procedure Description Patient positioned in bed supine.  Sedation given as noted above.  Patient was intubated with endotracheal tube using Glidescope.  View was Grade 2 only posterior commissure .  Number of attempts was 1.  Colorimetric CO2 detector was consistent with tracheal placement.   Complications/Tolerance None; patient tolerated the procedure well. Chest X-ray is ordered to verify placement.   EBL None   Specimen(s) None  ETT 7.5 @ 24 cm at the teeth

## 2023-10-24 NOTE — Evaluation (Signed)
 Clinical/Bedside Swallow Evaluation Patient Details  Name: Kenneth [name] MRN: [id number] Date of Birth: February 03, 1946  Today's Date: 10/24/2023 Time: SLP Start Time (ACUTE ONLY): 1548 SLP Stop Time (ACUTE ONLY): 1611 SLP Time Calculation (min) (ACUTE ONLY): 23 min  Past Medical History:  Past Medical History:  Diagnosis Date   Arthritis    CAD S/P percutaneous coronary angioplasty 06/28/2016   Cardiologist- Dr. Anner: 06/28/16 -- [facility] PCI (Synergy DES 2.75 x 20 mm); 07/10/16: Staged p-m LAD PCI (FFR 0.75) - DES PCI (Synergy DES 3.5 x 20 mm); 10/11/2020: mid-distal LAD 70% (FFR 0.75) DES PCI (Synergy DES 2.5 mm x 32 mm - overlaps prox-mid stent, tapered post-dilation 3.7 - 2.6 mm.;  06/2021 - Cath with severe LAD-D2 bifurcation ISR -- PTCA / kissing balloon - reduced LAD to ~25% & D2 to 40%   Chronic dryness of both eyes    Chronic heart failure with preserved ejection fraction (HFpEF) (HCC) 05/22/2013   Diverticulosis of colon    Epiretinal membrane    epiretinal attachment (06/28/2016) 10-14-2017 per pt oringinally in both , now only unilateral    GERD (gastroesophageal reflux disease)    Hiatal hernia    History of adenomatous polyp of colon    History of concussion 08/23/2017   w/o loc--- per pt no residual   History of kidney stones    History of rheumatic fever 1958   NO Evidence of Vavlular disease -> only mild Aortic Sclerosis   History of skin cancer    excision leg;  froze the left arm--- unsure BCC or SCC    Hyperlipidemia    Hypertension    ILD (interstitial lung disease) (HCC)    pulmologist-  dr jude--  secondary to methotrexate  use --- last PFTs 04-25-2015  mild restriction   Iron deficiency anemia    Mild obstructive sleep apnea    10-04-2019 per pt  had a sleep study years ago , was told no cpap recommended pt denies    Nephrolithiasis    CT 09-07-2019 bilateral nonobstructive stones   Paroxysmal atrial fibrillation (HCC) 2015   a. 11/2003 Tikosyn initiated -  subsequently d/c'd;  b. 2006 RFCA for Afib @ [facility];  c. 09/2011 Echo: EF 60-65%, Gr 2 DD;  d. DCCV 10/2011 , 04/2012, 12/ 2014, & 02/ 2015;  e. RFCA 01-13-2014   Plaque psoriasis 1969   followed [facility] rheumatologist  and [facility] [place]   PONV (postoperative nausea and vomiting)    Right ureteral stone    S/P drug eluting coronary stent placement    06-28-2016  x1 to mRCA;  07-10-2016 x1 to mLAD   S/P TAVR (transcatheter aortic valve replacement) 06/10/2023   s/p TAVR with a 29 mm Edwards S3UR via TF approach by Drs Wonda and Lucas   Scarring of lung    Seasonal allergies    Short-term memory loss    Type 2 diabetes mellitus (HCC)    followed by pcp   Ureteral calculus, right    Wears hearing aid in both ears    Past Surgical History:  Past Surgical History:  Procedure Laterality Date   APPENDECTOMY  1996   ATRIAL FIBRILLATION ABLATION N/A 01/13/2014   repeat PVI, also left atrial ablation performed with successfull ablation of LA flutter by Dr Kelsie   ATRIAL FIBRILLATION ABLATION  08/2004   Dr Katina at [facility])   CARDIAC STRESS PET  03/12/2022   Normal perfusion study/low risk.  Normal LVEF  and normal LVEF preserved.  Suspect reduced myocardial blood flow reserve related to microvascular disease.  No evidence of infarction or ischemia.  Resting EF 48%.  Stress EF 54%.  Mildly enlarged LV. Asc Ao 4.1 cm--stable.  Also stable mosaic attenuation in the lungs no change.   CARDIOVERSION N/A 05/22/2013   Procedure: CARDIOVERSION;  Surgeon: Lynwood Rakers, MD;  Location[facility] OR;  Service: Cardiovascular;  Laterality: N/A;   CARDIOVERSION N/A 10/23/2011   Procedure: CARDIOVERSION;  Surgeon: Elspeth JAYSON Sage, MD;  Location: [facility] CATH LAB;  Service: Cardiovascular;  Laterality: N/A;   CARDIOVERSION N/A 05/01/2012   Procedure: CARDIOVERSION;  Surgeon: Elspeth JAYSON Sage, MD;  Location: [facility] CATH LAB;  Service: Cardiovascular;  Laterality: N/A;   CARPAL TUNNEL RELEASE Right  1980s   CATARACT EXTRACTION W/ INTRAOCULAR LENS  IMPLANT, BILATERAL  2012  approx.   CORONARY BALLOON ANGIOPLASTY N/A 07/04/2021   Procedure: CORONARY BALLOON ANGIOPLASTY;  Surgeon: Anner Alm ORN, MD;  Location: [facility] INVASIVE CV LAB;  Service: Cardiovascular;; scoring balloon and an [place] balloon PTCA of LAD; with kissing balloon PTCA of LAD-D2 bifurcation stented segment reducing LAD to 20 % ISR and ostial D2 to 40%.   CORONARY PRESSURE/FFR STUDY N/A 06/28/2016   Procedure: Intravascular Pressure Wire/FFR Study;  Surgeon: Alm ORN Anner, MD;  Location: [facility] INVASIVE CV LAB;  Service: Cardiovascular: FFR of mLAD ~65% lesion = 0.75 post --> Staged PCI   CORONARY PRESSURE/FFR STUDY N/A 10/11/2020   Procedure: INTRAVASCULAR PRESSURE WIRE/FFR STUDY;  Surgeon: Anner Alm ORN, MD;  Location: [facility] INVASIVE CV LAB;  Service: Cardiovascular;  Laterality: LAD mid-distal just after D2 --> FFR 0.75 SIGNIFICANT => PCI   CORONARY STENT INTERVENTION N/A 06/28/2016   Procedure: Coronary Stent Intervention;  Surgeon: Alm ORN Anner, MD;  Location: [facility] INVASIVE CV LAB;  Service: Cardiovascular;  Laterality: mRCA PCI -Synergy DES 2.75 m x 20 mm (3.1 mm)   CORONARY STENT INTERVENTION N/A 07/10/2016   Procedure: Coronary Stent Intervention;  Surgeon: Alm ORN Anner, MD;  Location: [facility] INVASIVE CV LAB;  Service: Cardiovascular: mLAD PCI Synergy DES 3.5 mm x 20 mm)   CORONARY STENT INTERVENTION N/A 10/11/2020   Procedure: CORONARY STENT INTERVENTION;  Surgeon: Anner Alm ORN, MD;  Location: [facility] INVASIVE CV LAB;  Service: Cardiovascular: mid-distal LAD ~70% (FFR 0.75): SYNERGY DES 2.5 MM X 32 MM (overlaps prox-mid Stent from 07/2016) - Tapered post-dilation 3.7 mm ->2.6 mm; Jailed D2 PTCA reduced 60% to 40%.   CYSTOSCOPY WITH RETROGRADE PYELOGRAM, URETEROSCOPY AND STENT PLACEMENT Right 10/16/2017   Procedure: CYSTOSCOPY WITH RIGHT RETROGRADE RIGHT URETEROSCOPY AND STONE BASKET EXTRACTION;  Surgeon: Watt Rush, MD;  Location:  [facility];  Service: Urology;  Laterality: Right;   CYSTOSCOPY WITH RETROGRADE PYELOGRAM, URETEROSCOPY AND STENT PLACEMENT Right 10/08/2019   Procedure: CYSTOSCOPY WITH URETEROSCOPY; RETROGRADE PYELOGRAM; STONE BASKET EXTRACTION;  STENT PLACEMENT;  Surgeon: Sherrilee Belvie CROME, MD;  Location: [facility];  Service: Urology;  Laterality: Right;  1 HR   CYSTOSCOPY WITH RETROGRADE PYELOGRAM, URETEROSCOPY AND STENT PLACEMENT Right 03/28/2020   Procedure: CYSTOSCOPY WITH RIGHT RETROGRADE PYELOGRAM, URETEROSCOPY WITH HOLMIUM LASER AND STENT PLACEMENT;  Surgeon: Watt Rush, MD;  Location: [facility];  Service: Urology;  Laterality: Right;   CYSTOSCOPY WITH RETROGRADE PYELOGRAM, URETEROSCOPY AND STENT PLACEMENT Left 10/22/2021   Procedure: CYSTOSCOPY WITH LEFT RETROGRADE URETEROSCOPY WIHT HOLMIUM LASERAND STENT PLACEMENT;  Surgeon: Watt Rush, MD;  Location: [facility];  Service: Urology;  Laterality: Left;   CYSTOSCOPY/RETROGRADE/URETEROSCOPY/STONE EXTRACTION WITH BASKET  Right 10/27/2017   Procedure: CYSTOSCOPY/RETROGRADE/URETEROSCOPY/STONE EXTRACTION WITH BASKET/URETERAL STENT PLACEMENT and laser;  Surgeon: Watt Rush, MD;  Location: [facility];  Service: Urology;  Laterality: Right;   CYSTOSCOPY/URETEROSCOPY/HOLMIUM LASER/STENT PLACEMENT Right 01/03/2023   Procedure: CYSTOSCOPY RIGHT RETROGRADE URETEROSCOPY/HOLMIUM LASER/STENT PLACEMENT;  Surgeon: Watt Rush, MD;  Location: [facility];  Service: Urology;  Laterality: Right;   EXTRACORPOREAL SHOCK WAVE LITHOTRIPSY Left 08/10/2020   Procedure: EXTRACORPOREAL SHOCK WAVE LITHOTRIPSY (ESWL);  Surgeon: Cam Morene ORN, MD;  Location: [facility];  Service: Urology;  Laterality: Left;   EXTRACORPOREAL SHOCK WAVE LITHOTRIPSY Right 12/16/2022   Procedure: RIGHT EXTRACORPOREAL SHOCK WAVE LITHOTRIPSY (ESWL);  Surgeon: Watt Rush, MD;  Location: [facility];  Service: Urology;  Laterality: Right;   HOLMIUM LASER  APPLICATION Right 10/27/2017   Procedure: POSSIBLE HOLMIUM LASER APPLICATION;  Surgeon: Watt Rush, MD;  Location: [facility];  Service: Urology;  Laterality: Right;   HOLMIUM LASER APPLICATION Right 10/08/2019   Procedure: HOLMIUM LASER APPLICATION;  Surgeon: Sherrilee Belvie CROME, MD;  Location: [facility];  Service: Urology;  Laterality: Right;   INTRAOPERATIVE TRANSTHORACIC ECHOCARDIOGRAM N/A 06/10/2023   Procedure: INTRAOPERATIVE TRANSTHORACIC ECHOCARDIOGRAM;  Surgeon: Wonda Sharper, MD;  Location: [facility] INVASIVE CV LAB;  Service: Cardiovascular;  Laterality: N/A;   LEFT HEART CATH AND CORONARY ANGIOGRAPHY  06/30/2003   Dr. Morris: NO Significant/Obstructive CAD; Preseved LVEF (for Recurrent Afib)   NASAL SINUS SURGERY     x 2   NM MYOVIEW  LTD  02/11/2021   Lexiscan :EF 55-65%.  Normal function.  No ischemia or infarction.  No EKG changes noted.   PROXIMAL INTERPHALANGEAL FUSION (PIP) Left 01-05-2001    dr graves   correction clawtoe and extensor tendon lengthening   RIGHT/LEFT HEART CATH AND CORONARY ANGIOGRAPHY N/A 06/28/2016   Procedure: Right/Left Heart Cath and Coronary Angiography;  Surgeon: Alm ORN Clay, MD;  Location: [facility] INVASIVE CV LAB;  Service: Cardiovascular: mRCA 99% (TIMI 2), mLAD ~65% (FFR 0.75), OM3 55%. mild Pulm HTN. Mod LVEDP elevation. EF 55-65%.   RIGHT/LEFT HEART CATH AND CORONARY ANGIOGRAPHY N/A 10/11/2020   Procedure: RIGHT/LEFT HEART CATH AND CORONARY ANGIOGRAPHY;  Surgeon: Clay Alm ORN, MD;  Location: [facility] INVASIVE CV LAB;; Patent DES in mRCA & p-mLAD. CULPRIT = m-d LAD 70% (just after D1 w/ Ost 60%) FFR 0.75 => DES PCI m-dLAD & PTCA ost D1 (overlapping prior stent distally & crossing D2 w/  Ost D2 post PTCA). RIGHT HEART CATH PRESSURES,  & CO-CI ARE NORMAL. NO PULM HTN   RIGHT/LEFT HEART CATH AND CORONARY ANGIOGRAPHY N/A 07/04/2021   Procedure: RIGHT/LEFT HEART CATH AND CORONARY ANGIOGRAPHY;  Surgeon: Clay Alm ORN, MD;  Location: [facility] INVASIVE CV LAB;   Service: Cardiovascular;; moderately elevated LVEDP of 20 mmHg, and PCWP-28 mmHg -> mean PAP of 24 mmHg.  Moderate AS-mean gradient 33 mmHg.  (Similar to Echo 26 mmHg); borderline low BP.  CO-CI 9.2 and 4.0.. Stable CAD W/ progression of ISR of LAD stent at D2 => PCI   RIGHT/LEFT HEART CATH AND CORONARY ANGIOGRAPHY N/A 05/27/2023   Procedure: RIGHT/LEFT HEART CATH AND CORONARY ANGIOGRAPHY;  Surgeon: Clay Alm ORN, MD;  Location: [facility] INVASIVE CV LAB;  Service: Cardiovascular;  Laterality: N/A;   ROTATOR CUFF REPAIR Bilateral last one 2014   TONSILLECTOMY  age 50   TRANSTHORACIC ECHOCARDIOGRAM  09/29/2020   EF > 75%, Hyperdynamic. No RWMA. Mild LVH. ~ DD with mild LA dilation. Mild AI. Normal RV size & fxn, ? Dilated IVC c/w CVP ~  15 mHg.   TRANSTHORACIC ECHOCARDIOGRAM  04/12/2021   EF estimated 65%.  Normal LV size and function.  No RWMA.  Mild LVH.  GRII DD.  Elevated LVEDP (c/w HFpEF).  Normal RV size.  Mildly elevated PAP estimated 36 mmHg.  Mild MR.  Mild AS (mean gradient 26 mmHg.  DI 0.32.  Aortic root 40 mm, ascending aorta 41 mm.   URETEROSCOPIC LASER LITHOTRIPSY STONE EXTRACTIONS/  STENT PLACEMENT Bilateral 09-10-2007   dr watt  [facility]   per pt has had several prior ureteroscopic stone extraction since age 67 , last one 09-10-2007   Zio patch monitor:  02/2021   Probably Sinus Rhythm: Heart rate 56-146 bpm, average 83 bpm.  Frequent PACs noted with rare isolated PVCs.  Multiple episodes of PAT/atrial runs-longest 23 seconds average heart rate 95 bpm, fastest 5 beats at a max rate of 231 bpm.  No evidence of A-fib or atrial flutter.  No prolonged or sustained SVT/PAT.   HPI:  78 yo presenting after fall at home with L sided weakness. CT Head showed R parietal ICH with IVH with cerebral edema, brain compression and midline shift of the brain. Repeat CT Head showed substantially increased SAH. PMH includes: CAD, GERD, hiatal hernia, DM2, Afib, concussion, short-term memory loss    Assessment /  Plan / Recommendation  Clinical Impression  Pt initially passed a swallow screen this morning but has not been alert enough to initiate diet yet per RN. He has been getting sips of water and ice chips without overt difficulty per RN and family report. SLP provided stimulation to increase alertness and attention to POs, including use of family as therapeutic agent. Pt would nod his head yes to wanting boluses, but with presentation of ice and purees to his lips, there were no active attempts to get POs into his oral cavity. Hopeful given that he had passed a swallow screen that he will be able to try more POs as his alertness improves, but would not start a diet while so lethargic. Given that he did pass a swallow screen, could still offer sips of water and ice chips if alert enough to be asking for them. Could consider offering essential meds crushed in puree, but since he would have to be awake enough to attempt this as well, may need to consider alternative options if he does not start to wake up more consistently soon. SLP will f/u on subsequent date for ongoing evaluation.   SLP Visit Diagnosis: Dysphagia, unspecified (R13.10)    Aspiration Risk       Diet Recommendation NPO except meds;Ice chips PRN after oral care;Free water protocol after oral care    Medication Administration: Crushed with puree    Other  Recommendations Oral Care Recommendations: Oral care QID Caregiver Recommendations: Have oral suction available     Assistance Recommended at Discharge Frequent or constant Supervision/Assistance  Functional Status Assessment Patient has had a recent decline in their functional status and demonstrates the ability to make significant improvements in function in a reasonable and predictable amount of time.  Frequency and Duration min 2x/week  2 weeks       Prognosis Prognosis for improved oropharyngeal function: Good Barriers to Reach Goals: Cognitive deficits      Swallow Study    General HPI: 78 yo presenting after fall at home with L sided weakness. CT Head showed R parietal ICH with IVH with cerebral edema, brain compression and midline shift of the brain. Repeat CT Head showed  substantially increased SAH. PMH includes: CAD, GERD, hiatal hernia, DM2, Afib, concussion, short-term memory loss Type of Study: Bedside Swallow Evaluation Previous Swallow Assessment: none in chart Diet Prior to this Study: NPO Temperature Spikes Noted: No Respiratory Status: Room air History of Recent Intubation: No Behavior/Cognition: Lethargic/Drowsy;Requires cueing Oral Cavity Assessment: Within Functional Limits Oral Care Completed by SLP: Yes Self-Feeding Abilities: Total assist Patient Positioning: Upright in bed Baseline Vocal Quality: Normal    Oral/Motor/Sensory Function     Ice Chips Ice chips: Impaired Presentation: Spoon Oral Phase Impairments: Poor awareness of bolus;Other (comment) (no acceptance)   Thin Liquid Thin Liquid: Not tested    Nectar Thick Nectar Thick Liquid: Not tested   Honey Thick Honey Thick Liquid: Not tested   Puree Puree: Impaired Presentation: Spoon Oral Phase Impairments: Poor awareness of bolus;Other (comment) (no acceptance)   Solid     Solid: Not tested      Leita SAILOR., M.A. CCC-SLP Acute Rehabilitation Services Office: [phone]  Secure chat preferred  10/24/2023,4:30 PM

## 2023-10-24 NOTE — H&P (Signed)
 NEUROLOGY CONSULT NOTE   Date of service: October 24, 2023 Patient Name: Kenneth [name] MRN:  [id number] DOB:  1946-01-12 Chief Complaint: code stroke, L sided weakness Requesting Provider: Carita Senior, MD  History of Present Illness  Kenneth [name] is a 78 y.o. male with hx of CAD, GERD, DM2, Afibb on eliquis , who was last seen normal at 2130 on 10/23/23 when he went to bed. He got up around 0140 and stumbled around the bed and walked over to the bathroom. He was having difficulty with moving his left arm and asked wife about who's hand it was. Wife got him her rolator walker. When he got up from the bathroom to walk back, he fell and hit his head hard on the carpet in the closet. He was not moving his left side. EMS called and was noted to have left sided weakness and neglect. He was hypertensiveto 190s.  Wife reports that he was acting a bit angry later in the day yesterday but they went to bible study and patient was doing fine when they got back home around 8pm.   LKW: 2130 Modified rankin score: 1-No significant post stroke disability and can perform usual duties with stroke symptoms IV Thrombolysis: not offered, pt is on eliquis . EVT: not offered, ICH ICH Score:1 (intraventricular extension).  NIHSS components Score: Comment  1a Level of Conscious 0[x]  1[]  2[]  3[]      1b LOC Questions 0[]  1[]  2[x]       1c LOC Commands 0[]  1[]  2[x]       2 Best Gaze 0[]  1[x]  2[]       3 Visual 0[]  1[]  2[x]  3[]      4 Facial Palsy 0[x]  1[]  2[]  3[]      5a Motor Arm - left 0[]  1[]  2[]  3[]  4[x]  UN[]    5b Motor Arm - Right 0[x]  1[]  2[]  3[]  4[]  UN[]    6a Motor Leg - Left 0[]  1[]  2[x]  3[]  4[]  UN[]    6b Motor Leg - Right 0[x]  1[]  2[]  3[]  4[]  UN[]    7 Limb Ataxia 0[x]  1[]  2[]  UN[]      8 Sensory 0[]  1[]  2[x]  UN[]      9 Best Language 0[x]  1[]  2[]  3[]      10 Dysarthria 0[x]  1[]  2[]  UN[]      11 Extinct. and Inattention 0[]  1[]  2[x]       TOTAL: 17      ROS  Unable to ascertain due to hard of  hearing.  Past History   Past Medical History:  Diagnosis Date   Arthritis    CAD S/P percutaneous coronary angioplasty 06/28/2016   Cardiologist- Dr. Anner: 06/28/16 -- [facility] PCI (Synergy DES 2.75 x 20 mm); 07/10/16: Staged p-m LAD PCI (FFR 0.75) - DES PCI (Synergy DES 3.5 x 20 mm); 10/11/2020: mid-distal LAD 70% (FFR 0.75) DES PCI (Synergy DES 2.5 mm x 32 mm - overlaps prox-mid stent, tapered post-dilation 3.7 - 2.6 mm.;  06/2021 - Cath with severe LAD-D2 bifurcation ISR -- PTCA / kissing balloon - reduced LAD to ~25% & D2 to 40%   Chronic dryness of both eyes    Chronic heart failure with preserved ejection fraction (HFpEF) (HCC) 05/22/2013   Diverticulosis of colon    Epiretinal membrane    epiretinal attachment (06/28/2016) 10-14-2017 per pt oringinally in both , now only unilateral    GERD (gastroesophageal reflux disease)    Hiatal hernia    History of adenomatous polyp of colon    History of concussion 08/23/2017   w/o  loc--- per pt no residual   History of kidney stones    History of rheumatic fever 1958   NO Evidence of Vavlular disease -> only mild Aortic Sclerosis   History of skin cancer    excision leg;  froze the left arm--- unsure BCC or SCC    Hyperlipidemia    Hypertension    ILD (interstitial lung disease) (HCC)    pulmologist-  dr jude--  secondary to methotrexate  use --- last PFTs 04-25-2015  mild restriction   Iron deficiency anemia    Mild obstructive sleep apnea    10-04-2019 per pt  had a sleep study years ago , was told no cpap recommended pt denies    Nephrolithiasis    CT 09-07-2019 bilateral nonobstructive stones   Paroxysmal atrial fibrillation (HCC) 2015   a. 11/2003 Tikosyn initiated - subsequently d/c'd;  b. 2006 RFCA for Afib @ [facility];  c. 09/2011 Echo: EF 60-65%, Gr 2 DD;  d. DCCV 10/2011 , 04/2012, 12/ 2014, & 02/ 2015;  e. RFCA 01-13-2014   Plaque psoriasis 1969   follow[facility]  and [facility] [place]   PONV (postoperative  nausea and vomiting)    Right ureteral stone    S/P drug eluting coronary stent placement    06-28-2016  x1 to mRCA;  07-10-2016 x1 to mLAD   S/P TAVR (transcatheter aortic valve replacement) 06/10/2023   s/p TAVR with a 29 mm Edwards S3UR via TF approach by Drs Wonda and Lucas   Scarring of lung    Seasonal allergies    Short-term memory loss    Type 2 diabetes mellitus (HCC)    followed by pcp   Ureteral calculus, right    Wears hearing aid in both ears     Past Surgical History:  Procedure Laterality Date   APPENDECTOMY  1996   ATRIAL FIBRILLATION ABLATION N/A 01/13/2014   repeat PVI, also left atrial ablation performed with successfull ablation of LA flutter by Dr Kelsie   ATRIAL FIBRILLATION ABLATION  08/2004   Dr Katina at [facility] [place])   CARDIAC STRESS PET  03/12/2022   Normal perfusion study/low risk.  Normal LVEF and normal LVEF preserved.  Suspect reduced myocardial blood flow reserve related to microvascular disease.  No evidence of infarction or ischemia.  Resting EF 48%.  Stress EF 54%.  Mildly enlarged LV. Asc Ao 4.1 cm--stable.  Also stable mosaic attenuation in the lungs no change.   CARDIOVERSION N/A 05/22/2013   Procedure: CARDIOVERSION;  Surgeon: Lynwood Kelsie, MD;  [facility];  Service: Cardiovascular;  Laterality: N/A;   CARDIOVERSION N/A 10/23/2011   Procedure: CARDIOVERSION;  Surgeon: Elspeth JAYSON Sage, MD;  Location: [facility] CATH LAB;  Service: Cardiovascular;  Laterality: N/A;   CARDIOVERSION N/A 05/01/2012   Procedure: CARDIOVERSION;  Surgeon: Elspeth JAYSON Sage, MD;  Location: [facility] CATH LAB;  Service: Cardiovascular;  Laterality: N/A;   CARPAL TUNNEL RELEASE Right 1980s   CATARACT EXTRACTION W/ INTRAOCULAR LENS  IMPLANT, BILATERAL  2012  approx.   CORONARY BALLOON ANGIOPLASTY N/A 07/04/2021   Procedure: CORONARY BALLOON ANGIOPLASTY;  Surgeon: Anner Alm ORN, MD;  Location: [facility] INVASIVE CV LAB;  Service: Cardiovascular;; scoring balloon and an [place] balloon  PTCA of LAD; with kissing balloon PTCA of LAD-D2 bifurcation stented segment reducing LAD to 20 % ISR and ostial D2 to 40%.   CORONARY PRESSURE/FFR STUDY N/A 06/28/2016   Procedure: Intravascular Pressure Wire/FFR Study;  Surgeon: Alm ORN Anner, MD;  Location: [facility] INVASIVE CV LAB;  Service: Cardiovascular: FFR of mLAD ~65% lesion = 0.75 post --> Staged PCI   CORONARY PRESSURE/FFR STUDY N/A 10/11/2020   Procedure: INTRAVASCULAR PRESSURE WIRE/FFR STUDY;  Surgeon: Anner Alm ORN, MD;  Location: [facility] INVASIVE CV LAB;  Service: Cardiovascular;  Laterality: LAD mid-distal just after D2 --> FFR 0.75 SIGNIFICANT => PCI   CORONARY STENT INTERVENTION N/A 06/28/2016   Procedure: Coronary Stent Intervention;  Surgeon: Alm ORN Anner, MD;  Location: [facility] INVASIVE CV LAB;  Service: Cardiovascular;  Laterality: mRCA PCI -Synergy DES 2.75 m x 20 mm (3.1 mm)   CORONARY STENT INTERVENTION N/A 07/10/2016   Procedure: Coronary Stent Intervention;  Surgeon: Alm ORN Anner, MD;  Location: [facility] INVASIVE CV LAB;  Service: Cardiovascular: mLAD PCI Synergy DES 3.5 mm x 20 mm)   CORONARY STENT INTERVENTION N/A 10/11/2020   Procedure: CORONARY STENT INTERVENTION;  Surgeon: Anner Alm ORN, MD;  Location: [facility] INVASIVE CV LAB;  Service: Cardiovascular: mid-distal LAD ~70% (FFR 0.75): SYNERGY DES 2.5 MM X 32 MM (overlaps prox-mid Stent from 07/2016) - Tapered post-dilation 3.7 mm ->2.6 mm; Jailed D2 PTCA reduced 60% to 40%.   CYSTOSCOPY WITH RETROGRADE PYELOGRAM, URETEROSCOPY AND STENT PLACEMENT Right 10/16/2017   Procedure: CYSTOSCOPY WITH RIGHT RETROGRADE RIGHT URETEROSCOPY AND STONE BASKET EXTRACTION;  Surgeon: Watt Rush, MD;  Location: [facility];  Service: Urology;  Laterality: Right;   CYSTOSCOPY WITH RETROGRADE PYELOGRAM, URETEROSCOPY AND STENT PLACEMENT Right 10/08/2019   Procedure: CYSTOSCOPY WITH URETEROSCOPY; RETROGRADE PYELOGRAM; STONE BASKET EXTRACTION;  STENT PLACEMENT;  Surgeon: Sherrilee Belvie CROME,  MD;  Location: [facility];  Service: Urology;  Laterality: Right;  1 HR   CYSTOSCOPY WITH RETROGRADE PYELOGRAM, URETEROSCOPY AND STENT PLACEMENT Right 03/28/2020   Procedure: CYSTOSCOPY WITH RIGHT RETROGRADE PYELOGRAM, URETEROSCOPY WITH HOLMIUM LASER AND STENT PLACEMENT;  Surgeon: Watt Rush, MD;  Location: [facility];  Service: Urology;  Laterality: Right;   CYSTOSCOPY WITH RETROGRADE PYELOGRAM, URETEROSCOPY AND STENT PLACEMENT Left 10/22/2021   Procedure: CYSTOSCOPY WITH LEFT RETROGRADE URETEROSCOPY WIHT HOLMIUM LASERAND STENT PLACEMENT;  Surgeon: Watt Rush, MD;  Location: [facility];  Service: Urology;  Laterality: Left;   CYSTOSCOPY/RETROGRADE/URETEROSCOPY/STONE EXTRACTION WITH BASKET Right 10/27/2017   Procedure: CYSTOSCOPY/RETROGRADE/URETEROSCOPY/STONE EXTRACTION WITH BASKET/URETERAL STENT PLACEMENT and laser;  Surgeon: Watt Rush, MD;  Location: [facility];  Service: Urology;  Laterality: Right;   CYSTOSCOPY/URETEROSCOPY/HOLMIUM LASER/STENT PLACEMENT Right 01/03/2023   Procedure: CYSTOSCOPY RIGHT RETROGRADE URETEROSCOPY/HOLMIUM LASER/STENT PLACEMENT;  Surgeon: Watt Rush, MD;  Location: [facility];  Service: Urology;  Laterality: Right;   EXTRACORPOREAL SHOCK WAVE LITHOTRIPSY Left 08/10/2020   Procedure: EXTRACORPOREAL SHOCK WAVE LITHOTRIPSY (ESWL);  Surgeon: Cam Morene ORN, MD;  Location: [facility];  Service: Urology;  Laterality: Left;   EXTRACORPOREAL SHOCK WAVE LITHOTRIPSY Right 12/16/2022   Procedure: RIGHT EXTRACORPOREAL SHOCK WAVE LITHOTRIPSY (ESWL);  Surgeon: Watt Rush, MD;  Location: [facility];  Service: Urology;  Laterality: Right;   HOLMIUM LASER APPLICATION Right 10/27/2017   Procedure: POSSIBLE HOLMIUM LASER APPLICATION;  Surgeon: Watt Rush, MD;  Location: [facility];  Service: Urology;  Laterality: Right;   HOLMIUM LASER APPLICATION Right 10/08/2019   Procedure: HOLMIUM LASER APPLICATION;  Surgeon: Sherrilee Belvie CROME, MD;  Location:  [facility];  Service: Urology;  Laterality: Right;   INTRAOPERATIVE TRANSTHORACIC ECHOCARDIOGRAM N/A 06/10/2023   Procedure: INTRAOPERATIVE TRANSTHORACIC ECHOCARDIOGRAM;  Surgeon: Wonda Sharper, MD;  Location: [facility] INVASIVE CV LAB;  Service: Cardiovascular;  Laterality: N/A;   LEFT HEART CATH AND  CORONARY ANGIOGRAPHY  06/30/2003   Dr. Morris: NO Significant/Obstructive CAD; Preseved LVEF (for Recurrent Afib)   NASAL SINUS SURGERY     x 2   NM MYOVIEW  LTD  02/11/2021   Lexiscan :EF 55-65%.  Normal function.  No ischemia or infarction.  No EKG changes noted.   PROXIMAL INTERPHALANGEAL FUSION (PIP) Left 01-05-2001    dr graves   correction clawtoe and extensor tendon lengthening   RIGHT/LEFT HEART CATH AND CORONARY ANGIOGRAPHY N/A 06/28/2016   Procedure: Right/Left Heart Cath and Coronary Angiography;  Surgeon: Alm LELON Clay, MD;  Location: [facility] INVASIVE CV LAB;  Service: Cardiovascular: mRCA 99% (TIMI 2), mLAD ~65% (FFR 0.75), OM3 55%. mild Pulm HTN. Mod LVEDP elevation. EF 55-65%.   RIGHT/LEFT HEART CATH AND CORONARY ANGIOGRAPHY N/A 10/11/2020   Procedure: RIGHT/LEFT HEART CATH AND CORONARY ANGIOGRAPHY;  Surgeon: Clay Alm LELON, MD;  Location: [facility] INVASIVE CV LAB;; Patent DES in mRCA & p-mLAD. CULPRIT = m-d LAD 70% (just after D1 w/ Ost 60%) FFR 0.75 => DES PCI m-dLAD & PTCA ost D1 (overlapping prior stent distally & crossing D2 w/  Ost D2 post PTCA). RIGHT HEART CATH PRESSURES,  & CO-CI ARE NORMAL. NO PULM HTN   RIGHT/LEFT HEART CATH AND CORONARY ANGIOGRAPHY N/A 07/04/2021   Procedure: RIGHT/LEFT HEART CATH AND CORONARY ANGIOGRAPHY;  Surgeon: Clay Alm LELON, MD;  Location: [facility] INVASIVE CV LAB;  Service: Cardiovascular;; moderately elevated LVEDP of 20 mmHg, and PCWP-28 mmHg -> mean PAP of 24 mmHg.  Moderate AS-mean gradient 33 mmHg.  (Similar to Echo 26 mmHg); borderline low BP.  CO-CI 9.2 and 4.0.. Stable CAD W/ progression of ISR of LAD stent at D2 => PCI   RIGHT/LEFT HEART CATH  AND CORONARY ANGIOGRAPHY N/A 05/27/2023   Procedure: RIGHT/LEFT HEART CATH AND CORONARY ANGIOGRAPHY;  Surgeon: Clay Alm LELON, MD;  Location: [facility] INVASIVE CV LAB;  Service: Cardiovascular;  Laterality: N/A;   ROTATOR CUFF REPAIR Bilateral last one 2014   TONSILLECTOMY  age 4   TRANSTHORACIC ECHOCARDIOGRAM  09/29/2020   EF > 75%, Hyperdynamic. No RWMA. Mild LVH. ~ DD with mild LA dilation. Mild AI. Normal RV size & fxn, ? Dilated IVC c/w CVP ~ 15 mHg.   TRANSTHORACIC ECHOCARDIOGRAM  04/12/2021   EF estimated 65%.  Normal LV size and function.  No RWMA.  Mild LVH.  GRII DD.  Elevated LVEDP (c/w HFpEF).  Normal RV size.  Mildly elevated PAP estimated 36 mmHg.  Mild MR.  Mild AS (mean gradient 26 mmHg.  DI 0.32.  Aortic root 40 mm, ascending aorta 41 mm.   URETEROSCOPIC LASER LITHOTRIPSY STONE EXTRACTIONS/  STENT PLACEMENT Bilateral 09-10-2007   dr watt  [facility]   per pt has had several prior ureteroscopic stone extraction since age 75 , last one 09-10-2007   Zio patch monitor:  02/2021   Probably Sinus Rhythm: Heart rate 56-146 bpm, average 83 bpm.  Frequent PACs noted with rare isolated PVCs.  Multiple episodes of PAT/atrial runs-longest 23 seconds average heart rate 95 bpm, fastest 5 beats at a max rate of 231 bpm.  No evidence of A-fib or atrial flutter.  No prolonged or sustained SVT/PAT.    Family History: Family History  Problem Relation Age of Onset   Coronary artery disease Mother    Breast cancer Mother    Colon cancer Mother    Heart disease Maternal Grandmother        MI   Stroke Paternal Grandfather  CVA   Coronary artery disease Paternal Grandfather    Esophageal cancer Neg Hx    Stomach cancer Neg Hx    Rectal cancer Neg Hx     Social History  reports that he quit smoking about 26 years ago. His smoking use included cigars. He has quit using smokeless tobacco.  His smokeless tobacco use included snuff and chew. He reports that he does not drink alcohol  and does not use  drugs.  Allergies  Allergen Reactions   Hydrocodone  Shortness Of Breath    In combination with decongestants   Other Palpitations and Other (See Comments)    Reaction to all decongestants - palpitations, irregular heart rhythm   Oxycontin  [Oxycodone ] Shortness Of Breath   Sudafed [Pseudoephedrine] Palpitations   Ultram  [Tramadol ] Shortness Of Breath, Rash and Other (See Comments)    Flushing    Lopid [Gemfibrozil] Other (See Comments)    Dizziness      Medications   Current Facility-Administered Medications:    sodium chloride  flush (NS) 0.9 % injection 3 mL, 3 mL, Intravenous, Once, Rancour, Stephen, MD  Current Outpatient Medications:    acetaminophen  (TYLENOL ) 500 MG tablet, Take 1,000 mg by mouth every 6 (six) hours as needed for mild pain (pain score 1-3) or moderate pain (pain score 4-6)., Disp: , Rfl:    Adalimumab-bwwd (HADLIMA) 40 MG/0.4ML SOSY, Inject 40 mg into the skin every 14 (fourteen) days., Disp: , Rfl:    amoxicillin  (AMOXIL ) 500 MG tablet, Take 4 tablets (2,000 mg total) by mouth as directed. 1 hour prior to dental work including cleanings (Patient taking differently: Take 2,000 mg by mouth daily as needed (For dental cleaning only).), Disp: 12 tablet, Rfl: 12   apixaban  (ELIQUIS ) 5 MG TABS tablet, Take 1 tablet (5 mg total) by mouth 2 (two) times daily., Disp: 60 tablet, Rfl: 3   cetirizine (ZYRTEC) 10 MG tablet, Take 10 mg by mouth daily., Disp: , Rfl:    Dorzolamide  HCl-Timolol  Mal PF 2-0.5 % SOLN, Place 1 drop into both eyes at bedtime., Disp: , Rfl:    empagliflozin  (JARDIANCE ) 25 MG TABS tablet, Take 12.5 mg by mouth daily., Disp: , Rfl:    ferrous sulfate  325 (65 FE) MG tablet, Take 325 mg by mouth every Monday, Wednesday, and Friday., Disp: , Rfl:    furosemide  (LASIX ) 20 MG tablet, Take 20 mg by mouth See admin instructions. Take 1 tablet (20mg ) twice a week on Monday and Thursday., Disp: , Rfl:    galantamine  (RAZADYNE  ER) 24 MG 24 hr capsule, Take 24 mg  by mouth in the morning., Disp: , Rfl:    memantine  (NAMENDA ) 10 MG tablet, Take 20 mg by mouth at bedtime., Disp: , Rfl:    metFORMIN  (GLUCOPHAGE ) 500 MG tablet, Take 500 mg by mouth at bedtime., Disp: , Rfl:    Multiple Vitamins-Minerals (MULTIVITAMIN GUMMIES MENS) CHEW, Chew 1 each by mouth daily., Disp: , Rfl:    omeprazole  (PRILOSEC  OTC) 20 MG tablet, Take 20 mg by mouth daily as needed (Acid reflux)., Disp: , Rfl:    ranolazine  (RANEXA ) 500 MG 12 hr tablet, Take 2 tablets ( 1000 mg) in the morning  and 1 tablet ( 500 mg) in the evening, Disp: 270 tablet, Rfl: 3   rosuvastatin  (CRESTOR ) 40 MG tablet, Take 1 tablet (40 mg total) by mouth daily., Disp: 90 tablet, Rfl: 3   tamsulosin (FLOMAX) 0.4 MG CAPS capsule, Take 1 capsule by mouth once daily, Disp: 90 capsule, Rfl: 0  Facility-Administered Medications Ordered in Other Encounters:    lidocaine  (cardiac) 100 mg/40ml (XYLOCAINE ) 20 MG/ML injection 2%, , , Anesthesia Intra-op, Flowers, Rokoshi T, CRNA, 60 mg at 03/25/13 1617   propofol  (DIPRIVAN ) 10 mg/mL bolus/IV push, , , Anesthesia Intra-op, Flowers, Rokoshi T, CRNA, 90 mg at 03/25/13 1617  Vitals   There were no vitals filed for this visit.  There is no height or weight on file to calculate BMI.   Physical Exam   General: Laying comfortably in bed; in no acute distress.  HENT: Normal oropharynx and mucosa. Normal external appearance of ears and nose. Bruise on his left forehead.  Neck: Supple, no pain or tenderness  CV: No JVD. No peripheral edema.  Pulmonary: Symmetric Chest rise. Normal respiratory effort.  Abdomen: Soft to touch, non-tender.  Ext: No cyanosis, edema, or deformity  Skin: No rash. Normal palpation of skin.   Musculoskeletal: Normal digits and nails by inspection. No clubbing.   Neurologic Examination  Mental status/Cognition: Alert, oriented to self, place, but not to month and year, good attention.  Speech/language: Fluent, comprehension intact, object  naming intact, repetition intact. Cranial nerves:   CN II Pupils equal and reactive to light, L hemianopsia, does not blink to threat on the left.   CN III,IV,VI R gaze preference but crosses midline.   CN V normal sensation in V1, V2, and V3 segments bilaterally   CN VII no asymmetry, no nasolabial fold flattening   CN VIII normal hearing to speech    CN IX & X normal palatal elevation, no uvular deviation    CN XI 5/5 head turn and 5/5 shoulder shrug bilaterally    CN XII midline tongue protrusion    Motor:  Muscle bulk: normal, tone flaccid in LUE Mvmt Root Nerve  Muscle Right Left Comments  SA C5/6 Ax Deltoid 5 0   EF C5/6 Mc Biceps 5 0   EE C6/7/8 Rad Triceps     WF C6/7 Med FCR     WE C7/8 PIN ECU     F Ab C8/T1 U ADM/FDI 5 0   HF L1/2/3 Fem Illopsoas 5 3   KE L2/3/4 Fem Quad     DF L4/5 D Peron Tib Ant 5 2   PF S1/2 Tibial Grc/Sol 5 2    Sensation:  Light touch Absent in L arm.   Pin prick    Temperature    Vibration   Proprioception    Coordination/Complex Motor:  - Finger to Nose intact on the right. - Heel to shin unable to do. - Rapid alternating movement slowed on the right, unable to do with LUE. - Gait: deferred for patient safety.   Labs/Imaging/Neurodiagnostic studies   CBC: No results for input(s): WBC, NEUTROABS, HGB, HCT, MCV, PLT in the last 168 hours. Basic Metabolic Panel:  Lab Results  Component Value Date   NA 140 06/22/2023   K 3.6 06/22/2023   CO2 22 06/22/2023   GLUCOSE 112 (H) 06/22/2023   BUN 19 06/22/2023   CREATININE 1.27 (H) 06/22/2023   CALCIUM  9.2 06/22/2023   GFRNONAA 58 (L) 06/22/2023   GFRAA >60 09/07/2019   Lipid Panel:  Lab Results  Component Value Date   LDLCALC 80 05/22/2023   HgbA1c:  Lab Results  Component Value Date   HGBA1C 6.3 (H) 05/22/2023   Urine Drug Screen: No results found for: LABOPIA, COCAINSCRNUR, LABBENZ, AMPHETMU, THCU, LABBARB  Alcohol  Level No results found for:  [facility] INR  Lab Results  Component Value Date   INR 1.0 06/09/2023   APTT No results found for: APTT AED levels: No results found for: PHENYTOIN, ZONISAMIDE, LAMOTRIGINE, LEVETIRACETA  CT Head without contrast(Personally reviewed): Large 6.7 cm intraparenchymal hemorrhage centered in the right parietal lobe with intraventricular extension and 3 mm of leftward midline shift.  CT angio Head and Neck with contrast(Personally reviewed): Positive for spot sign with active contrast extravasation.  MRI Brain(Personally reviewed): pending  ASSESSMENT   Kenneth [name] is a 78 y.o. male with hx of CAD, GERD, DM2, Afibb on eliquis , who was last seen normal at 2130 on 10/23/23 when he went to bed. He got up around 0140 and stumbled around the bed and walked over to the bathroom. He was having difficulty with moving his left arm and seemed to not be recognizing the left arm.  He had a fall after using the bathroom with complete left-sided weakness and unable to stand up.  He hit his head pretty hard with the left forehead scalp hematoma.  He was brought in as a code stroke as well as a trauma alert.  CT head without contrast demonstrates a large 6.7 cm intraparenchymal hemorrhage with intraventricular extension and 3 mm leftward midline shift.  CT angio demonstrates spot sign with noted contrast extravasation left hematoma.  Patient took his Eliquis  at 8:30 PM last night and is also in hypertensive emergency with systolic of over 809d at presentation.  He was not deemed a candidate for TNKase or thrombectomy he is on Eliquis  and he has a ICH.  ICH score of 1 for IVH.  RECOMMENDATIONS  R parietal ICH with IVH with cerebral edema, brain compression and midline shift of the brain. - Admit to ICU - Stability scan in 6 hours or STAT with any neurological decline - Frequent neuro checks; q61min for 1 hour, then q1hour - No antiplatelets or anticoagulants due to ICH - SCD for DVT prophylaxis,  pharmacological DVT ppx at 24 hours if ICH is stable - Blood pressure control with goal systolic 110 - 130, cleverplex and labetalol  PRN. Goal lowered due to noted positive spot sign on CTA. - Stroke labs, HgbA1c, fasting lipid panel - MRI brain with and without contrast when stabilized to evaluate for underlying mass - Risk factor modification - Echocardiogram - PT consult, OT consult, Speech consult. - Stroke team to follow - hypertonic saline with 250cc bolus, followed by 75cc/hr. - eliquis  reversal with Andexxa.  Hypertensive emergency: - goal SBP as above. PRN Labetalol  and cleviprex for SBP above goal.  Afibb on eliquis : - continue home metoprolol .  ______________________________________________________________________  This patient is critically ill and at significant risk of neurological worsening, death and care requires constant monitoring of vital signs, hemodynamics,respiratory and cardiac monitoring, neurological assessment, discussion with family, other specialists and medical decision making of high complexity. I spent 75 minutes of neurocritical care time  in the care of  this patient. This was time spent independent of any time provided by nurse practitioner or PA.  Plan discussed extensively with patient's wife and daughter in the ED consult room.  Plan also discussed with Dr. Carita with the ED team.  I also discussed allergies and updated in the chart.  I discussed patient's CODE STATUS and at this time, patient is full code.  Patient seems to be protecting his airway but will need to be closely monitored for somnolence.  He did have a couple episodes of vomiting which have not occurred since getting Zofran .  Natally Ribera  [facility] 10/24/2023  4:20 AM   Signed, Shadi Larner, MD [facility]

## 2023-10-24 NOTE — Plan of Care (Addendum)
 Late entry note.  Was notified of declining mentation by RN.  On my evaluation, patient is more lethargic and is breathing 40-50 times a minute.  He requires a lot of tactile stimulation/coaxing to eventually acknowledge the examiner.  My concern is that he is eventually going to tire out and airway will become an eventual problem.  Will get critical care team to come by and evaluate him.  Will get CT head without contrast afterwards.  Plan discussed with patient's wife at the bedside.  Update: 10:49 PM Reviewed CT Head, notable for worsening midling shift, worsening lateral and third ventricle hydrocephalus and worsening SAH overlying the left hemisphere.  Discussed with family at bedside. Patient unlikely to have a good quality of life given ICH, SAH and now hydrocephalus. Will aim for lower BP goal between 110-130 for now with hope that it prevents any further expansion of hemorrhage. However, dont want to drop it significantly as it can decrease cerebral perfusion. Repeat CT Head at 0500. Family contemplating comfort care.  Kenneth [name] [facility]

## 2023-10-24 NOTE — Consult Note (Addendum)
 NAME:  Kenneth [name], MRN:  [id number], DOB:  1946/02/17, LOS: 0 ADMISSION DATE:  10/24/2023, CONSULTATION DATE:  10/24/2023 REFERRING MD:  Vanessa, neurology CHIEF COMPLAINT:  respiratory failure    History of Present Illness:  78 year old male with past medical history of type 2 diabetes, hypertension, hyperlipidemia, GERD, CAD s/p PCI RCA 2018, AS s/p TAVR 06/2023, HFpEF, paroxysmal atrial fibrillation on Eliquis  , ILD, OSA who presented to the ED 7/18 early AM as a code stroke/trauma after wife found him confused with L sided weakness after a fall, LKW 7/17 @ 2130. Head CT showed large R parietal ICH with intraventricular extension, mass effect with 3mm leftward MLS. He was admitted to ICU by stroke team and started on Cleviprex, HTS 250cc bolus with maintenance 75cc/hr HTS and Eliquis  was reversed with Andexxa. He had 6h f/u CT head which demonstrated substantially increased bilateral SAH, large volume IVH, stable MLS. Per stroke team, over the course of today has become less arousable with increasing RR. CCM was consulted shortly after 8PM for evaluation of elective intubation as the patient is tachypneic into the 50s and difficult to arouse.   On exam, he is very somnolent. He is breathing 50x/min with paradoxical respirations. Wife is at bedside. She is understandably concerned.   Pertinent  Medical History  type 2 diabetes, hypertension, hyperlipidemia, GERD, CAD s/p PCI RCA 2018, AS s/p TAVR 06/2023, HFpEF, paroxysmal atrial fibrillation on Eliquis  , ILD, OSA  Significant Hospital Events: Including procedures, antibiotic start and stop dates in addition to other pertinent events   7/18: code stroke/trauma>ICH/IPH/IVH started on HTS > repeat CT head worse > more obtunded and resp failure > CCM consulted for intubation   Interim History / Subjective:  Wife at bedside. Patient is in respiratory distress. Will proceed with intubation. Patient's granddaughter is ICU nurse here on neuro  floor. Patient's spouse wanted me to call and update her which I did.   Objective   Blood pressure (!) 151/66, pulse (!) 129, temperature 98.2 F (36.8 C), temperature source Oral, resp. rate (!) 49, height 6' 2 (1.88 m), weight 93.2 kg, SpO2 95%.        Intake/Output Summary (Last 24 hours) at 10/24/2023 2028 Last data filed at 10/24/2023 2000 Gross per 24 hour  Intake 2040.85 ml  Output 1800 ml  Net 240.85 ml   Filed Weights   10/24/23 0300 10/24/23 0457  Weight: 93.2 kg 93.2 kg    Examination: General: elderly male, laying in bed, in respiratory distress  HENT: ncat, nasal cannula, mm dry  Lungs: diminished bases, tachypneic, abdominal breathing  Cardiovascular: irregularly irregular rhythm  Abdomen: soft  Extremities: no edema  Neuro: difficult to arouse, does not follow commands or answer orientation questions, L weakness  GU: defer   Resolved Hospital Problem list    Assessment & Plan:  Acute hypoxic respiratory failure 2/2 large ICH  History of ILD - Fibrotic NSIP  History of OSA  Worsening respiratory distress since admission with tachypnea to 50s, less responsive. Last saw Dr. Alva 05/2023 for ILD - diagnosis of fibrotic NSIP on 5mg  prednisone  daily. His CXR shows bilateral lower lobe opacities. CT chest shows diffuse ground glass opacities which is consistent with his known ILD. Radiology calling mild pulmonary edema.  - f/u ABG, CXR  - do not feel he needs lasix  at this time, can f/u echo ordered if his HFpEF has decompensated. Don't appreciate crackles and no peripheral edema  - start home prednisone  5mg  daily  -  full mechanical vent support - lung protective ventilation 6-8cc/kg Vt - VAP and PAD bundle in place  - titrate FiO2 to sat goal >92  - maintain peak/plats <30, driving pressures <84    Acute R Parietal ICH with intraventricular extension, SAH and midline shift  Code stroke with LKW 7/17 2130. Left sided weakness, confusion. CT with large right  parietal ICH with IVH extension and 3mm MLS. Repeat head CT with worsening now bilateral SAH, increased IVH. Was bolused on admission with 250cc HTS and placed on maintenance HTS.  - primary stroke team, appreciate management  - hypertonic saline @ 75cc/hr, bolus PRN - sodium q6h , goal 150-155 - SBP goal 130-150 for 24h then SBP < 160 - cleviprexx gtt and PRN IV labetalol  and hydralazine   - lipitor 80mg   - no anticoagulation  - neuro checks  - PT/OT/SLP when appropriate   Hypertension Hyperlipidemia  - statin as above  - SBP goals as above, transition to orals when appropriate   CAD s/p PCI RCA 2018 AS s/p TAVR 06/2023 HFpEF Paroxysmal atrial fibrillation on Eliquis  Last echo 07/14/23 with EF 65-70%, mild LVH, G2DD, LA/RA mod dilated, mild RVR. Was on Eliquis  prior to fall.  - currently in afib RVR, will see if propofol /fent calm down rate given his distress and can start rate control as needed  - no anticoagulation due to above, start when okay with neurology  - repeat echo while admitted, part of stroke work up  - statin   Type 2 diabetes  HA1c 6.3  - ssi  - cbg q4h   Best Practice (right click and Reselect all SmartList Selections daily)  Below per primary  Diet/type: NPO DVT prophylaxis: SCD GI prophylaxis: PPI Lines: na Foley:  na Code Status:  full code Last date of multidisciplinary goals of care discussion [per primary]  Labs   CBC: Recent Labs  Lab 10/24/23 0314 10/24/23 0322  WBC 10.6*  --   NEUTROABS 6.4  --   HGB 13.7 13.9  HCT 40.8 41.0  MCV 86.4  --   PLT 181  --     Basic Metabolic Panel: Recent Labs  Lab 10/24/23 0314 10/24/23 0322 10/24/23 0844 10/24/23 1500  NA 139 140 141 145  K 3.9 4.0  --   --   CL 105 111  --   --   CO2 21*  --   --   --   GLUCOSE 153* 155*  --   --   BUN 10 10  --   --   CREATININE 1.04 1.10  --   --   CALCIUM  9.8  --   --   --    GFR: Estimated Creatinine Clearance: 64.3 mL/min (by C-G formula based on SCr  of 1.1 mg/dL). Recent Labs  Lab 10/24/23 0314  WBC 10.6*    Liver Function Tests: Recent Labs  Lab 10/24/23 0314  AST 42*  ALT 22  ALKPHOS 81  BILITOT 1.1  PROT >12.0*  ALBUMIN 4.3   No results for input(s): LIPASE, AMYLASE in the last 168 hours. No results for input(s): AMMONIA in the last 168 hours.  ABG    Component Value Date/Time   PHART 7.515 (H) 05/27/2023 1455   PCO2ART 24.0 (L) 05/27/2023 1455   PO2ART 103 05/27/2023 1455   HCO3 20.7 05/27/2023 1503   TCO2 22 10/24/2023 0322   ACIDBASEDEF 2.0 05/27/2023 1503   O2SAT 71 05/27/2023 1503     Coagulation Profile: Recent Labs  Lab 10/24/23 0314  INR 1.8*    Cardiac Enzymes: No results for input(s): CKTOTAL, CKMB, CKMBINDEX, TROPONINI in the last 168 hours.  HbA1C: Hgb A1c MFr Bld  Date/Time Value Ref Range Status  05/22/2023 08:32 AM 6.3 (H) 4.8 - 5.6 % Final    Comment:             Prediabetes: 5.7 - 6.4          Diabetes: >6.4          Glycemic control for adults with diabetes: <7.0   12/30/2022 01:19 PM 6.7 (H) 4.8 - 5.6 % Final    Comment:    (NOTE) Pre diabetes:          5.7%-6.4%  Diabetes:              >6.4%  Glycemic control for   <7.0% adults with diabetes     CBG: Recent Labs  Lab 10/24/23 0315  GLUCAP 163*    Review of Systems:   As above  Past Medical History:  He,  has a past medical history of Arthritis, CAD S/P percutaneous coronary angioplasty (06/28/2016), Chronic dryness of both eyes, Chronic heart failure with preserved ejection fraction (HFpEF) (HCC) (05/22/2013), Diverticulosis of colon, Epiretinal membrane, GERD (gastroesophageal reflux disease), Hiatal hernia, History of adenomatous polyp of colon, History of concussion (08/23/2017), History of kidney stones, History of rheumatic fever (1958), History of skin cancer, Hyperlipidemia, Hypertension, ILD (interstitial lung disease) (HCC), Iron deficiency anemia, Mild obstructive sleep apnea,  Nephrolithiasis, Paroxysmal atrial fibrillation (HCC) (2015), Plaque psoriasis (1969), PONV (postoperative nausea and vomiting), Right ureteral stone, S/P drug eluting coronary stent placement, S/P TAVR (transcatheter aortic valve replacement) (06/10/2023), Scarring of lung, Seasonal allergies, Short-term memory loss, Type 2 diabetes mellitus (HCC), Ureteral calculus, right, and Wears hearing aid in both ears.   Surgical History:   Past Surgical History:  Procedure Laterality Date   APPENDECTOMY  1996   ATRIAL FIBRILLATION ABLATION N/A 01/13/2014   repeat PVI, also left atrial ablation performed with successfull ablation of LA flutter by Dr Kelsie   ATRIAL FIBRILLATION ABLATION  08/2004   Dr Katina at [facility] , [place])   CARDIAC STRESS PET  03/12/2022   Normal perfusion study/low risk.  Normal LVEF and normal LVEF preserved.  Suspect reduced myocardial blood flow reserve related to microvascular disease.  No evidence of infarction or ischemia.  Resting EF 48%.  Stress EF 54%.  Mildly enlarged LV. Asc Ao 4.1 cm--stable.  Also stable mosaic attenuation in the lungs no change.   CARDIOVERSION N/A 05/22/2013   Procedure: CARDIOVERSION;  Surgeon: Lynwood Kelsie, MD;  Location: [facility] OR;  Service: Cardiovascular;  Laterality: N/A;   CARDIOVERSION N/A 10/23/2011   Procedure: CARDIOVERSION;  Surgeon: Elspeth JAYSON Sage, MD;  Location: [facility] CATH LAB;  Service: Cardiovascular;  Laterality: N/A;   CARDIOVERSION N/A 05/01/2012   Procedure: CARDIOVERSION;  Surgeon: Elspeth JAYSON Sage, MD;  Location: [facility] CATH LAB;  Service: Cardiovascular;  Laterality: N/A;   CARPAL TUNNEL RELEASE Right 1980s   CATARACT EXTRACTION W/ INTRAOCULAR LENS  IMPLANT, BILATERAL  2012  approx.   CORONARY BALLOON ANGIOPLASTY N/A 07/04/2021   Procedure: CORONARY BALLOON ANGIOPLASTY;  Surgeon: Anner Alm ORN, MD;  Location: [facility] INVASIVE CV LAB;  Service: Cardiovascular;; scoring balloon and an [place] balloon PTCA of LAD; with kissing balloon PTCA  of LAD-D2 bifurcation stented segment reducing LAD to 20 % ISR and ostial D2 to 40%.   CORONARY PRESSURE/FFR STUDY N/A 06/28/2016  Procedure: Intravascular Pressure Wire/FFR Study;  Surgeon: Alm LELON Clay, MD;  Location: [facility] INVASIVE CV LAB;  Service: Cardiovascular: FFR of mLAD ~65% lesion = 0.75 post --> Staged PCI   CORONARY PRESSURE/FFR STUDY N/A 10/11/2020   Procedure: INTRAVASCULAR PRESSURE WIRE/FFR STUDY;  Surgeon: Clay Alm LELON, MD;  Location: [facility] INVASIVE CV LAB;  Service: Cardiovascular;  Laterality: LAD mid-distal just after D2 --> FFR 0.75 SIGNIFICANT => PCI   CORONARY STENT INTERVENTION N/A 06/28/2016   Procedure: Coronary Stent Intervention;  Surgeon: Alm LELON Clay, MD;  Location: [facility] INVASIVE CV LAB;  Service: Cardiovascular;  Laterality: mRCA PCI -Synergy DES 2.75 m x 20 mm (3.1 mm)   CORONARY STENT INTERVENTION N/A 07/10/2016   Procedure: Coronary Stent Intervention;  Surgeon: Alm LELON Clay, MD;  Location: [facility] INVASIVE CV LAB;  Service: Cardiovascular: mLAD PCI Synergy DES 3.5 mm x 20 mm)   CORONARY STENT INTERVENTION N/A 10/11/2020   Procedure: CORONARY STENT INTERVENTION;  Surgeon: Clay Alm LELON, MD;  Location: [facility] INVASIVE CV LAB;  Service: Cardiovascular: mid-distal LAD ~70% (FFR 0.75): SYNERGY DES 2.5 MM X 32 MM (overlaps prox-mid Stent from 07/2016) - Tapered post-dilation 3.7 mm ->2.6 mm; Jailed D2 PTCA reduced 60% to 40%.   CYSTOSCOPY WITH RETROGRADE PYELOGRAM, URETEROSCOPY AND STENT PLACEMENT Right 10/16/2017   Procedure: CYSTOSCOPY WITH RIGHT RETROGRADE RIGHT URETEROSCOPY AND STONE BASKET EXTRACTION;  Surgeon: Watt Rush, MD;  Location: [facility];  Service: Urology;  Laterality: Right;   CYSTOSCOPY WITH RETROGRADE PYELOGRAM, URETEROSCOPY AND STENT PLACEMENT Right 10/08/2019   Procedure: CYSTOSCOPY WITH URETEROSCOPY; RETROGRADE PYELOGRAM; STONE BASKET EXTRACTION;  STENT PLACEMENT;  Surgeon: Sherrilee Belvie CROME, MD;  Location: [facility];  Service: Urology;  Laterality: Right;  1 HR   CYSTOSCOPY WITH RETROGRADE PYELOGRAM, URETEROSCOPY AND STENT PLACEMENT Right 03/28/2020   Procedure: CYSTOSCOPY WITH RIGHT RETROGRADE PYELOGRAM, URETEROSCOPY WITH HOLMIUM LASER AND STENT PLACEMENT;  Surgeon: Watt Rush, MD;  Location: [facility];  Service: Urology;  Laterality: Right;   CYSTOSCOPY WITH RETROGRADE PYELOGRAM, URETEROSCOPY AND STENT PLACEMENT Left 10/22/2021   Procedure: CYSTOSCOPY WITH LEFT RETROGRADE URETEROSCOPY WIHT HOLMIUM LASERAND STENT PLACEMENT;  Surgeon: Watt Rush, MD;  Location: [facility];  Service: Urology;  Laterality: Left;   CYSTOSCOPY/RETROGRADE/URETEROSCOPY/STONE EXTRACTION WITH BASKET Right 10/27/2017   Procedure: CYSTOSCOPY/RETROGRADE/URETEROSCOPY/STONE EXTRACTION WITH BASKET/URETERAL STENT PLACEMENT and laser;  Surgeon: Watt Rush, MD;  Location: [facility];  Service: Urology;  Laterality: Right;   CYSTOSCOPY/URETEROSCOPY/HOLMIUM LASER/STENT PLACEMENT Right 01/03/2023   Procedure: CYSTOSCOPY RIGHT RETROGRADE URETEROSCOPY/HOLMIUM LASER/STENT PLACEMENT;  Surgeon: Watt Rush, MD;  Location: [facility];  Service: Urology;  Laterality: Right;   EXTRACORPOREAL SHOCK WAVE LITHOTRIPSY Left 08/10/2020   Procedure: EXTRACORPOREAL SHOCK WAVE LITHOTRIPSY (ESWL);  Surgeon: Cam Morene LELON, MD;  Location: [facility];  Service: Urology;  Laterality: Left;   EXTRACORPOREAL SHOCK WAVE LITHOTRIPSY Right 12/16/2022   Procedure: RIGHT EXTRACORPOREAL SHOCK WAVE LITHOTRIPSY (ESWL);  Surgeon: Watt Rush, MD;  Location: [facility];  Service: Urology;  Laterality: Right;   HOLMIUM LASER APPLICATION Right 10/27/2017   Procedure: POSSIBLE HOLMIUM LASER APPLICATION;  Surgeon: Watt Rush, MD;  Location: [facility];  Service: Urology;  Laterality: Right;   HOLMIUM LASER APPLICATION Right 10/08/2019   Procedure: HOLMIUM LASER APPLICATION;  Surgeon: Sherrilee Belvie CROME, MD;  Location: [facility];  Service:  Urology;  Laterality: Right;   INTRAOPERATIVE TRANSTHORACIC ECHOCARDIOGRAM N/A 06/10/2023   Procedure: INTRAOPERATIVE TRANSTHORACIC ECHOCARDIOGRAM;  Surgeon: Wonda Sharper, MD;  Location: [facility] INVASIVE CV LAB;  Service: Cardiovascular;  Laterality: N/A;   LEFT HEART CATH AND CORONARY ANGIOGRAPHY  06/30/2003   Dr. Morris: NO Significant/Obstructive CAD; Preseved LVEF (for Recurrent Afib)   NASAL SINUS SURGERY     x 2   NM MYOVIEW  LTD  02/11/2021   Lexiscan :EF 55-65%.  Normal function.  No ischemia or infarction.  No EKG changes noted.   PROXIMAL INTERPHALANGEAL FUSION (PIP) Left 01-05-2001    dr graves   correction clawtoe and extensor tendon lengthening   RIGHT/LEFT HEART CATH AND CORONARY ANGIOGRAPHY N/A 06/28/2016   Procedure: Right/Left Heart Cath and Coronary Angiography;  Surgeon: Alm LELON Clay, MD;  Location: [facility] INVASIVE CV LAB;  Service: Cardiovascular: mRCA 99% (TIMI 2), mLAD ~65% (FFR 0.75), OM3 55%. mild Pulm HTN. Mod LVEDP elevation. EF 55-65%.   RIGHT/LEFT HEART CATH AND CORONARY ANGIOGRAPHY N/A 10/11/2020   Procedure: RIGHT/LEFT HEART CATH AND CORONARY ANGIOGRAPHY;  Surgeon: Clay Alm LELON, MD;  Location: [facility] INVASIVE CV LAB;; Patent DES in mRCA & p-mLAD. CULPRIT = m-d LAD 70% (just after D1 w/ Ost 60%) FFR 0.75 => DES PCI m-dLAD & PTCA ost D1 (overlapping prior stent distally & crossing D2 w/  Ost D2 post PTCA). RIGHT HEART CATH PRESSURES,  & CO-CI ARE NORMAL. NO PULM HTN   RIGHT/LEFT HEART CATH AND CORONARY ANGIOGRAPHY N/A 07/04/2021   Procedure: RIGHT/LEFT HEART CATH AND CORONARY ANGIOGRAPHY;  Surgeon: Clay Alm LELON, MD;  Location: [facility] INVASIVE CV LAB;  Service: Cardiovascular;; moderately elevated LVEDP of 20 mmHg, and PCWP-28 mmHg -> mean PAP of 24 mmHg.  Moderate AS-mean gradient 33 mmHg.  (Similar to Echo 26 mmHg); borderline low BP.  CO-CI 9.2 and 4.0.. Stable CAD W/ progression of ISR of LAD stent at D2 => PCI   RIGHT/LEFT HEART CATH AND CORONARY ANGIOGRAPHY N/A 05/27/2023    Procedure: RIGHT/LEFT HEART CATH AND CORONARY ANGIOGRAPHY;  Surgeon: Clay Alm LELON, MD;  Location: [facility] INVASIVE CV LAB;  Service: Cardiovascular;  Laterality: N/A;   ROTATOR CUFF REPAIR Bilateral last one 2014   TONSILLECTOMY  age 82   TRANSTHORACIC ECHOCARDIOGRAM  09/29/2020   EF > 75%, Hyperdynamic. No RWMA. Mild LVH. ~ DD with mild LA dilation. Mild AI. Normal RV size & fxn, ? Dilated IVC c/w CVP ~ 15 mHg.   TRANSTHORACIC ECHOCARDIOGRAM  04/12/2021   EF estimated 65%.  Normal LV size and function.  No RWMA.  Mild LVH.  GRII DD.  Elevated LVEDP (c/w HFpEF).  Normal RV size.  Mildly elevated PAP estimated 36 mmHg.  Mild MR.  Mild AS (mean gradient 26 mmHg.  DI 0.32.  Aortic root 40 mm, ascending aorta 41 mm.   URETEROSCOPIC LASER LITHOTRIPSY STONE EXTRACTIONS/  STENT PLACEMENT Bilateral 09-10-2007   dr watt  [facility]   per pt has had several prior ureteroscopic stone extraction since age 74 , last one 09-10-2007   Zio patch monitor:  02/2021   Probably Sinus Rhythm: Heart rate 56-146 bpm, average 83 bpm.  Frequent PACs noted with rare isolated PVCs.  Multiple episodes of PAT/atrial runs-longest 23 seconds average heart rate 95 bpm, fastest 5 beats at a max rate of 231 bpm.  No evidence of A-fib or atrial flutter.  No prolonged or sustained SVT/PAT.     Social History:   reports that he quit smoking about 26 years ago. His smoking use included cigars. He has quit using smokeless tobacco.  His smokeless tobacco use included snuff and chew. He reports that he does not drink alcohol  and  does not use drugs.   Family History:  His family history includes Breast cancer in his mother; Colon cancer in his mother; Coronary artery disease in his mother and paternal grandfather; Heart disease in his maternal grandmother; Stroke in his paternal grandfather. There is no history of Esophageal cancer, Stomach cancer, or Rectal cancer.   Allergies Allergies  Allergen Reactions   Hydrocodone  Shortness Of  Breath    In combination with decongestants   Other Palpitations and Other (See Comments)    Reaction to all decongestants - palpitations, irregular heart rhythm   Oxycontin  [Oxycodone ] Shortness Of Breath   Sudafed [Pseudoephedrine] Palpitations   Ultram  [Tramadol ] Shortness Of Breath, Rash and Other (See Comments)    Flushing    Lopid [Gemfibrozil] Other (See Comments)    Dizziness       Home Medications  Prior to Admission medications   Medication Sig Start Date End Date Taking? Authorizing Provider  acetaminophen  (TYLENOL ) 500 MG tablet Take 1,000 mg by mouth every 6 (six) hours as needed for mild pain (pain score 1-3) or moderate pain (pain score 4-6).   Yes [provider]  Adalimumab-bwwd (HADLIMA) 40 MG/0.4ML SOSY Inject 40 mg into the skin every 14 (fourteen) days.   Yes [provider]  amoxicillin  (AMOXIL ) 500 MG tablet Take 4 tablets (2,000 mg total) by mouth as directed. 1 hour prior to dental work including cleanings Patient taking differently: Take 2,000 mg by mouth daily as needed (For dental cleaning only). 06/16/23  Yes Sebastian Lamarr SAUNDERS, PA-C  apixaban  (ELIQUIS ) 5 MG TABS tablet Take 1 tablet (5 mg total) by mouth 2 (two) times daily. 01/12/21  Yes Fenton, Clint R, PA  cetirizine (ZYRTEC) 10 MG tablet Take 10 mg by mouth daily.   Yes [provider]  Dorzolamide  HCl-Timolol  Mal PF 2-0.5 % SOLN Place 1 drop into both eyes at bedtime.   Yes [provider]  empagliflozin  (JARDIANCE ) 25 MG TABS tablet Take 12.5 mg by mouth daily. 12/26/21  Yes [provider]  ferrous sulfate  325 (65 FE) MG tablet Take 325 mg by mouth every Monday, Wednesday, and Friday.   Yes Emelia Josefa HERO, NP  furosemide  (LASIX ) 20 MG tablet Take 20 mg by mouth See admin instructions. Take 1 tablet (20mg ) twice a week on Monday and Thursday.   Yes [provider]  galantamine  (RAZADYNE  ER) 24 MG 24 hr capsule Take 24 mg by mouth in the morning.  08/30/20  Yes [provider]  hydrOXYzine  (ATARAX ) 10 MG tablet Take 10 mg by mouth at bedtime as needed for anxiety (sleep). 08/12/23  Yes [provider]  melatonin 5 MG TABS Take 10 mg by mouth at bedtime.   Yes [provider]  memantine  (NAMENDA ) 10 MG tablet Take 20 mg by mouth at bedtime. 08/30/20  Yes [provider]  metFORMIN  (GLUCOPHAGE ) 500 MG tablet Take 500 mg by mouth at bedtime.   Yes [provider]  Multiple Vitamins-Minerals (MULTIVITAMIN GUMMIES MENS) CHEW Chew 1 each by mouth daily.   Yes [provider]  omeprazole  (PRILOSEC  OTC) 20 MG tablet Take 20 mg by mouth at bedtime.   Yes [provider]  ranolazine  (RANEXA ) 500 MG 12 hr tablet Take 2 tablets ( 1000 mg) in the morning  and 1 tablet ( 500 mg) in the evening 09/29/23  Yes Anner Alm ORN, MD  rosuvastatin  (CRESTOR ) 40 MG tablet Take 1 tablet (40 mg total) by mouth daily. 12/28/21  Yes Anner,  Alm ORN, MD  tamsulosin (FLOMAX) 0.4 MG CAPS capsule Take 1 capsule by mouth once daily Patient taking differently: Take 0.4 mg by mouth daily as needed (for fluid retention). 03/13/21  Yes McKenzie, Belvie CROME, MD     Critical care time: [address]    Tinnie FORBES Adolph DEVONNA [facility] 10/24/23 8:41 PM  Please see [email or web address] for pager details.  From 7A-7P if no response, please call [phone] After hours, please call ELink [phone]

## 2023-10-24 NOTE — Progress Notes (Signed)
   10/24/23 0700  SLP Visit Information  SLP Received On 10/24/23  Reason Eval/Treat Not Completed Fatigue/lethargy limiting ability to participate   RN reports pt is too lethargic for po.  Requested she contact SLP when/if pt awakens adequately for swallow eval. Thanks.  Madelin POUR, MS [facility] SLP Acute [employer] [phone]

## 2023-10-24 NOTE — Evaluation (Signed)
 Speech Language Pathology Evaluation Patient Details Name: Kenneth [name] MRN: [id number] DOB: 1946-02-19 Today's Date: 10/24/2023 Time: 8471-8451 SLP Time Calculation (min) (ACUTE ONLY): 20 min  Problem List:  Patient Active Problem List   Diagnosis Date Noted   ICH (intracerebral hemorrhage) (HCC) 10/24/2023   Memory loss 09/01/2023   Microvascular angina (HCC) 08/29/2023   Dyspnea 06/21/2023   S/P TAVR (transcatheter aortic valve replacement) 06/10/2023   Severe aortic stenosis 06/04/2023   SOB (shortness of breath) 06/02/2023   Preop cardiovascular exam 01/03/2023   S/P PTCA (percutaneous transluminal coronary angioplasty)    Hypercoagulable state due to paroxysmal atrial fibrillation (HCC); CHA2DS2-VASc score 5 01/08/2021   Overweight (BMI 25.0-29.9) 12/29/2018   Right ureteral stone 10/27/2017   Controlled type 2 diabetes mellitus without complication, without long-term current use of insulin  (HCC) 05/12/2017   Hypertension associated with diabetes (HCC) 05/12/2017   Iron deficiency anemia 11/11/2016   Hyperlipidemia associated with type 2 diabetes mellitus (HCC) 11/11/2016   CAD S/P percutaneous coronary angioplasty 06/28/2016   Coronary artery disease involving native coronary artery with angina pectoris (HCC) 06/27/2016   Exertional dyspnea 06/27/2016   Bronchitis 07/03/2015   ILD (interstitial lung disease) (HCC) 01/27/2014   Atypical atrial flutter (HCC) 01/13/2014   Hx of colonic polyp 07/15/2013   Chronic heart failure with preserved ejection fraction (HFpEF) (HCC) 05/22/2013   OTHER CHRONIC SINUSITIS 05/31/2010   NEPHROLITHIASIS, HX OF 02/02/2008   PERSISTENT DISORDER INITIATING/MAINTAINING SLEEP 12/16/2007   Obstructive sleep apnea 10/07/2007   UNSPEC ALVEOLAR&PARIETOALVEOLAR PNEUMONOPATHY 10/02/2007   Allergic rhinitis 07/24/2007   Paroxysmal atrial fibrillation (HCC) 07/06/2007   Past Medical History:  Past Medical History:  Diagnosis Date   Arthritis     CAD S/P percutaneous coronary angioplasty 06/28/2016   Cardiologist- Dr. Anner: 06/28/16 -- [facility] PCI (Synergy DES 2.75 x 20 mm); 07/10/16: Staged p-m LAD PCI (FFR 0.75) - DES PCI (Synergy DES 3.5 x 20 mm); 10/11/2020: mid-distal LAD 70% (FFR 0.75) DES PCI (Synergy DES 2.5 mm x 32 mm - overlaps prox-mid stent, tapered post-dilation 3.7 - 2.6 mm.;  06/2021 - Cath with severe LAD-D2 bifurcation ISR -- PTCA / kissing balloon - reduced LAD to ~25% & D2 to 40%   Chronic dryness of both eyes    Chronic heart failure with preserved ejection fraction (HFpEF) (HCC) 05/22/2013   Diverticulosis of colon    Epiretinal membrane    epiretinal attachment (06/28/2016) 10-14-2017 per pt oringinally in both , now only unilateral    GERD (gastroesophageal reflux disease)    Hiatal hernia    History of adenomatous polyp of colon    History of concussion 08/23/2017   w/o loc--- per pt no residual   History of kidney stones    History of rheumatic fever 1958   NO Evidence of Vavlular disease -> only mild Aortic Sclerosis   History of skin cancer    excision leg;  froze the left arm--- unsure BCC or SCC    Hyperlipidemia    Hypertension    ILD (interstitial lung disease) (HCC)    pulmologist-  dr jude--  secondary to methotrexate  use --- last PFTs 04-25-2015  mild restriction   Iron deficiency anemia    Mild obstructive sleep apnea    10-04-2019 per pt  had a sleep study years ago , was told no cpap recommended pt denies    Nephrolithiasis    CT 09-07-2019 bilateral nonobstructive stones   Paroxysmal atrial fibrillation (HCC) 2015   a. 11/2003 Tikosyn initiated -  subsequently d/c'd;  b. 2006 RFCA for Afib @ [facility];  c. 09/2011 Echo: EF 60-65%, Gr 2 DD;  d. DCCV 10/2011 , 04/2012, 12/ 2014, & 02/ 2015;  e. RFCA 01-13-2014   Plaque psoriasis 1969   follow[facility] rheumatologist  and [facility] [place]   PONV (postoperative nausea and vomiting)    Right ureteral stone    S/P drug eluting coronary stent  placement    06-28-2016  x1 to mRCA;  07-10-2016 x1 to mLAD   S/P TAVR (transcatheter aortic valve replacement) 06/10/2023   s/p TAVR with a 29 mm Edwards S3UR via TF approach by Drs Wonda and Lucas   Scarring of lung    Seasonal allergies    Short-term memory loss    Type 2 diabetes mellitus (HCC)    followed by pcp   Ureteral calculus, right    Wears hearing aid in both ears    Past Surgical History:  Past Surgical History:  Procedure Laterality Date   APPENDECTOMY  1996   ATRIAL FIBRILLATION ABLATION N/A 01/13/2014   repeat PVI, also left atrial ablation performed with successfull ablation of LA flutter by Dr Kelsie   ATRIAL FIBRILLATION ABLATION  08/2004   Dr Katina [facility]   CARDIAC STRESS PET  03/12/2022   Normal perfusion study/low risk.  Normal LVEF and normal LVEF preserved.  Suspect reduced myocardial blood flow reserve related to microvascular disease.  No evidence of infarction or ischemia.  Resting EF 48%.  Stress EF 54%.  Mildly enlarged LV. Asc Ao 4.1 cm--stable.  Also stable mosaic attenuation in the lungs no change.   CARDIOVERSION N/A 05/22/2013   Procedure: CARDIOVERSION;  Surgeon: Lynwood Kelsie, MD;  Location: [facility] 2 OR;  Service: Cardiovascular;  Laterality: N/A;   CARDIOVERSION N/A 10/23/2011   Procedure: CARDIOVERSION;  Surgeon: Elspeth JAYSON Sage, MD;  Location: [facility] CATH LAB;  Service: Cardiovascular;  Laterality: N/A;   CARDIOVERSION N/A 05/01/2012   Procedure: CARDIOVERSION;  Surgeon: Elspeth JAYSON Sage, MD;  Location: [facility] CATH LAB;  Service: Cardiovascular;  Laterality: N/A;   CARPAL TUNNEL RELEASE Right 1980s   CATARACT EXTRACTION W/ INTRAOCULAR LENS  IMPLANT, BILATERAL  2012  approx.   CORONARY BALLOON ANGIOPLASTY N/A 07/04/2021   Procedure: CORONARY BALLOON ANGIOPLASTY;  Surgeon: Anner Alm ORN, MD;  Location: [facility] INVASIVE CV LAB;  Service: Cardiovascular;; scoring balloon and an [place] balloon PTCA of LAD; with kissing balloon PTCA of LAD-D2  bifurcation stented segment reducing LAD to 20 % ISR and ostial D2 to 40%.   CORONARY PRESSURE/FFR STUDY N/A 06/28/2016   Procedure: Intravascular Pressure Wire/FFR Study;  Surgeon: Alm ORN Anner, MD;  Location: [facility] INVASIVE CV LAB;  Service: Cardiovascular: FFR of mLAD ~65% lesion = 0.75 post --> Staged PCI   CORONARY PRESSURE/FFR STUDY N/A 10/11/2020   Procedure: INTRAVASCULAR PRESSURE WIRE/FFR STUDY;  Surgeon: Anner Alm ORN, MD;  Location: [facility] INVASIVE CV LAB;  Service: Cardiovascular;  Laterality: LAD mid-distal just after D2 --> FFR 0.75 SIGNIFICANT => PCI   CORONARY STENT INTERVENTION N/A 06/28/2016   Procedure: Coronary Stent Intervention;  Surgeon: Alm ORN Anner, MD;  Location: [facility] INVASIVE CV LAB;  Service: Cardiovascular;  Laterality: mRCA PCI -Synergy DES 2.75 m x 20 mm (3.1 mm)   CORONARY STENT INTERVENTION N/A 07/10/2016   Procedure: Coronary Stent Intervention;  Surgeon: Alm ORN Anner, MD;  Location: [facility] INVASIVE CV LAB;  Service: Cardiovascular: mLAD PCI Synergy DES 3.5 mm x 20 mm)  CORONARY STENT INTERVENTION N/A 10/11/2020   Procedure: CORONARY STENT INTERVENTION;  Surgeon: Anner Alm ORN, MD;  Location: [facility] INVASIVE CV LAB;  Service: Cardiovascular: mid-distal LAD ~70% (FFR 0.75): SYNERGY DES 2.5 MM X 32 MM (overlaps prox-mid Stent from 07/2016) - Tapered post-dilation 3.7 mm ->2.6 mm; Jailed D2 PTCA reduced 60% to 40%.   CYSTOSCOPY WITH RETROGRADE PYELOGRAM, URETEROSCOPY AND STENT PLACEMENT Right 10/16/2017   Procedure: CYSTOSCOPY WITH RIGHT RETROGRADE RIGHT URETEROSCOPY AND STONE BASKET EXTRACTION;  Surgeon: Watt Rush, MD;  Location: [facility];  Service: Urology;  Laterality: Right;   CYSTOSCOPY WITH RETROGRADE PYELOGRAM, URETEROSCOPY AND STENT PLACEMENT Right 10/08/2019   Procedure: CYSTOSCOPY WITH URETEROSCOPY; RETROGRADE PYELOGRAM; STONE BASKET EXTRACTION;  STENT PLACEMENT;  Surgeon: Sherrilee Belvie CROME, MD;  Location: [facility];   Service: Urology;  Laterality: Right;  1 HR   CYSTOSCOPY WITH RETROGRADE PYELOGRAM, URETEROSCOPY AND STENT PLACEMENT Right 03/28/2020   Procedure: CYSTOSCOPY WITH RIGHT RETROGRADE PYELOGRAM, URETEROSCOPY WITH HOLMIUM LASER AND STENT PLACEMENT;  Surgeon: Watt Rush, MD;  Location: [facility];  Service: Urology;  Laterality: Right;   CYSTOSCOPY WITH RETROGRADE PYELOGRAM, URETEROSCOPY AND STENT PLACEMENT Left 10/22/2021   Procedure: CYSTOSCOPY WITH LEFT RETROGRADE URETEROSCOPY WIHT HOLMIUM LASERAND STENT PLACEMENT;  Surgeon: Watt Rush, MD;  Location: [facility];  Service: Urology;  Laterality: Left;   CYSTOSCOPY/RETROGRADE/URETEROSCOPY/STONE EXTRACTION WITH BASKET Right 10/27/2017   Procedure: CYSTOSCOPY/RETROGRADE/URETEROSCOPY/STONE EXTRACTION WITH BASKET/URETERAL STENT PLACEMENT and laser;  Surgeon: Watt Rush, MD;  Location: [facility];  Service: Urology;  Laterality: Right;   CYSTOSCOPY/URETEROSCOPY/HOLMIUM LASER/STENT PLACEMENT Right 01/03/2023   Procedure: CYSTOSCOPY RIGHT RETROGRADE URETEROSCOPY/HOLMIUM LASER/STENT PLACEMENT;  Surgeon: Watt Rush, MD;  Location: [facility];  Service: Urology;  Laterality: Right;   EXTRACORPOREAL SHOCK WAVE LITHOTRIPSY Left 08/10/2020   Procedure: EXTRACORPOREAL SHOCK WAVE LITHOTRIPSY (ESWL);  Surgeon: Cam Morene ORN, MD;  Location: [facility];  Service: Urology;  Laterality: Left;   EXTRACORPOREAL SHOCK WAVE LITHOTRIPSY Right 12/16/2022   Procedure: RIGHT EXTRACORPOREAL SHOCK WAVE LITHOTRIPSY (ESWL);  Surgeon: Watt Rush, MD;  Location: [facility];  Service: Urology;  Laterality: Right;   HOLMIUM LASER APPLICATION Right 10/27/2017   Procedure: POSSIBLE HOLMIUM LASER APPLICATION;  Surgeon: Watt Rush, MD;  Location: [facility];  Service: Urology;  Laterality: Right;   HOLMIUM LASER APPLICATION Right 10/08/2019   Procedure: HOLMIUM LASER APPLICATION;  Surgeon: Sherrilee Belvie CROME, MD;  Location: [facility];  Service: Urology;   Laterality: Right;   INTRAOPERATIVE TRANSTHORACIC ECHOCARDIOGRAM N/A 06/10/2023   Procedure: INTRAOPERATIVE TRANSTHORACIC ECHOCARDIOGRAM;  Surgeon: Wonda Sharper, MD;  Location: [facility] INVASIVE CV LAB;  Service: Cardiovascular;  Laterality: N/A;   LEFT HEART CATH AND CORONARY ANGIOGRAPHY  06/30/2003   Dr. Morris: NO Significant/Obstructive CAD; Preseved LVEF (for Recurrent Afib)   NASAL SINUS SURGERY     x 2   NM MYOVIEW  LTD  02/11/2021   Lexiscan :EF 55-65%.  Normal function.  No ischemia or infarction.  No EKG changes noted.   PROXIMAL INTERPHALANGEAL FUSION (PIP) Left 01-05-2001    dr graves   correction clawtoe and extensor tendon lengthening   RIGHT/LEFT HEART CATH AND CORONARY ANGIOGRAPHY N/A 06/28/2016   Procedure: Right/Left Heart Cath and Coronary Angiography;  Surgeon: Alm ORN Anner, MD;  Location: [facility] INVASIVE CV LAB;  Service: Cardiovascular: mRCA 99% (TIMI 2), mLAD ~65% (FFR 0.75), OM3 55%. mild Pulm HTN. Mod LVEDP elevation. EF 55-65%.   RIGHT/LEFT HEART CATH AND CORONARY ANGIOGRAPHY N/A 10/11/2020   Procedure: RIGHT/LEFT HEART CATH AND  CORONARY ANGIOGRAPHY;  Surgeon: Anner Alm ORN, MD;  Location: [facility] INVASIVE CV LAB;; Patent DES in mRCA & p-mLAD. CULPRIT = m-d LAD 70% (just after D1 w/ Ost 60%) FFR 0.75 => DES PCI m-dLAD & PTCA ost D1 (overlapping prior stent distally & crossing D2 w/  Ost D2 post PTCA). RIGHT HEART CATH PRESSURES,  & CO-CI ARE NORMAL. NO PULM HTN   RIGHT/LEFT HEART CATH AND CORONARY ANGIOGRAPHY N/A 07/04/2021   Procedure: RIGHT/LEFT HEART CATH AND CORONARY ANGIOGRAPHY;  Surgeon: Anner Alm ORN, MD;  Location: [facility] INVASIVE CV LAB;  Service: Cardiovascular;; moderately elevated LVEDP of 20 mmHg, and PCWP-28 mmHg -> mean PAP of 24 mmHg.  Moderate AS-mean gradient 33 mmHg.  (Similar to Echo 26 mmHg); borderline low BP.  CO-CI 9.2 and 4.0.. Stable CAD W/ progression of ISR of LAD stent at D2 => PCI   RIGHT/LEFT HEART CATH AND CORONARY ANGIOGRAPHY N/A 05/27/2023    Procedure: RIGHT/LEFT HEART CATH AND CORONARY ANGIOGRAPHY;  Surgeon: Anner Alm ORN, MD;  Location: [facility] INVASIVE CV LAB;  Service: Cardiovascular;  Laterality: N/A;   ROTATOR CUFF REPAIR Bilateral last one 2014   TONSILLECTOMY  age 89   TRANSTHORACIC ECHOCARDIOGRAM  09/29/2020   EF > 75%, Hyperdynamic. No RWMA. Mild LVH. ~ DD with mild LA dilation. Mild AI. Normal RV size & fxn, ? Dilated IVC c/w CVP ~ 15 mHg.   TRANSTHORACIC ECHOCARDIOGRAM  04/12/2021   EF estimated 65%.  Normal LV size and function.  No RWMA.  Mild LVH.  GRII DD.  Elevated LVEDP (c/w HFpEF).  Normal RV size.  Mildly elevated PAP estimated 36 mmHg.  Mild MR.  Mild AS (mean gradient 26 mmHg.  DI 0.32.  Aortic root 40 mm, ascending aorta 41 mm.   URETEROSCOPIC LASER LITHOTRIPSY STONE EXTRACTIONS/  STENT PLACEMENT Bilateral 09-10-2007   dr watt  [facility]   per pt has had several prior ureteroscopic stone extraction since age 5 , last one 09-10-2007   Zio patch monitor:  02/2021   Probably Sinus Rhythm: Heart rate 56-146 bpm, average 83 bpm.  Frequent PACs noted with rare isolated PVCs.  Multiple episodes of PAT/atrial runs-longest 23 seconds average heart rate 95 bpm, fastest 5 beats at a max rate of 231 bpm.  No evidence of A-fib or atrial flutter.  No prolonged or sustained SVT/PAT.   HPI:  78 yo presenting after fall at home with L sided weakness. CT Head showed R parietal ICH with IVH with cerebral edema, brain compression and midline shift of the brain. Repeat CT Head showed substantially increased SAH. PMH includes: CAD, GERD, hiatal hernia, DM2, Afib, concussion, short-term memory loss   Assessment / Plan / Recommendation Clinical Impression  Pt is quite lethargic this afternoon, but did attempt to engage in functional self-care tasks. He washed his face with a wash cloth with sustained attention of a few seconds at a time, but followed a few one-step commands. Per chart he has been neglectful of his L side, but he did wipe his  L eye x2 using his R hand. Note that he did need increased assistance with using his R hand as well - discussed with RN. He would grip the washcloth or toothbrush, but proprioception appeared to be impaired with pt repeatedly bringing the toothbrush to his eye without awareness. Per family, pt does have some reduced short-term memory at baseline but he has been increasingly forgetful and disoriented this admission. Will f/u to further assessment of cognitive and communicative abilities  as level of alertness improves, but anticipate that he will benefit from intensive SLP f/u at next level of care.      SLP Assessment  SLP Recommendation/Assessment: Patient needs continued Speech Language Pathology Services SLP Visit Diagnosis: Cognitive communication deficit (R41.841)     Assistance Recommended at Discharge  Frequent or constant Supervision/Assistance  Functional Status Assessment Patient has had a recent decline in their functional status and demonstrates the ability to make significant improvements in function in a reasonable and predictable amount of time.  Frequency and Duration min 2x/week  2 weeks      SLP Evaluation Cognition  Overall Cognitive Status: Impaired/Different from baseline Arousal/Alertness: Lethargic Attention: Focused;Sustained Focused Attention: Impaired Focused Attention Impairment: Verbal basic;Functional basic Sustained Attention: Impaired Sustained Attention Impairment: Verbal basic;Functional basic Memory: Impaired Memory Impairment: Decreased recall of new information Awareness: Impaired Awareness Impairment: Other (comment) (online) Safety/Judgment: Impaired       Comprehension  Auditory Comprehension Overall Auditory Comprehension: Impaired Commands: Impaired One Step Basic Commands: Other (comment) (following some commands when alert) Interfering Components: Attention;Other (comment) (LOA)    Expression Expression Primary Mode of Expression:  Verbal Verbal Expression Overall Verbal Expression: Other (comment) (needs more assessment)   Oral / Motor  Motor Speech Overall Motor Speech: Other (comment) (needs more assessment)            Leita SAILOR., M.A. CCC-SLP Acute Rehabilitation Services Office: [phone]  Secure chat preferred  10/24/2023, 4:22 PM

## 2023-10-24 NOTE — ED Notes (Signed)
 Trauma Response Nurse Documentation   ARDIAN [name] is a 78 y.o. male arriving to [facility] ED via EMS  On Eliquis  (apixaban ) daily. Trauma was activated as a Level 2 by ED charge RN based on the following trauma criteria Elderly patients > 65 with head trauma on anti-coagulation (excluding ASA).  Patient cleared for CT by Dr. Carita EDP. Pt transported to CT with trauma response nurse present to monitor. RN remained with the patient throughout their absence from the department for clinical observation.   GCS 14.   History   Past Medical History:  Diagnosis Date   Arthritis    CAD S/P percutaneous coronary angioplasty 06/28/2016   Cardiologist- Dr. Anner: 06/28/16 -- [facility] PCI (Synergy DES 2.75 x 20 mm); 07/10/16: Staged p-m LAD PCI (FFR 0.75) - DES PCI (Synergy DES 3.5 x 20 mm); 10/11/2020: mid-distal LAD 70% (FFR 0.75) DES PCI (Synergy DES 2.5 mm x 32 mm - overlaps prox-mid stent, tapered post-dilation 3.7 - 2.6 mm.;  06/2021 - Cath with severe LAD-D2 bifurcation ISR -- PTCA / kissing balloon - reduced LAD to ~25% & D2 to 40%   Chronic dryness of both eyes    Chronic heart failure with preserved ejection fraction (HFpEF) (HCC) 05/22/2013   Diverticulosis of colon    Epiretinal membrane    epiretinal attachment (06/28/2016) 10-14-2017 per pt oringinally in both , now only unilateral    GERD (gastroesophageal reflux disease)    Hiatal hernia    History of adenomatous polyp of colon    History of concussion 08/23/2017   w/o loc--- per pt no residual   History of kidney stones    History of rheumatic fever 1958   NO Evidence of Vavlular disease -> only mild Aortic Sclerosis   History of skin cancer    excision leg;  froze the left arm--- unsure BCC or SCC    Hyperlipidemia    Hypertension    ILD (interstitial lung disease) (HCC)    pulmologist-  dr jude--  secondary to methotrexate  use --- last PFTs 04-25-2015  mild restriction   Iron deficiency anemia    Mild obstructive sleep  apnea    10-04-2019 per pt  had a sleep study years ago , was told no cpap recommended pt denies    Nephrolithiasis    CT 09-07-2019 bilateral nonobstructive stones   Paroxysmal atrial fibrillation (HCC) 2015   a. 11/2003 Tikosyn initiated - subsequently d/c'd;  b. 2006 RFCA for Afib @ [facility];  c. 09/2011 Echo: EF 60-65%, Gr 2 DD;  d. DCCV 10/2011 , 04/2012, 12/ 2014, & 02/ 2015;  e. RFCA 01-13-2014   Plaque psoriasis 1969   followed by rheumatologist  and [facility] @ [facility] [place]   PONV (postoperative nausea and vomiting)    Right ureteral stone    S/P drug eluting coronary stent placement    06-28-2016  x1 to mRCA;  07-10-2016 x1 to mLAD   S/P TAVR (transcatheter aortic valve replacement) 06/10/2023   s/p TAVR with a 29 mm Edwards S3UR via TF approach by Drs Wonda and Lucas   Scarring of lung    Seasonal allergies    Short-term memory loss    Type 2 diabetes mellitus (HCC)    followed by pcp   Ureteral calculus, right    Wears hearing aid in both ears      Past Surgical History:  Procedure Laterality Date   APPENDECTOMY  1996   ATRIAL FIBRILLATION ABLATION N/A 01/13/2014  repeat PVI, also left atrial ablation performed with successfull ablation of LA flutter by Dr Kelsie   ATRIAL FIBRILLATION ABLATION  08/2004   Dr Katina at [facility] [place])   CARDIAC STRESS PET  03/12/2022   Normal perfusion study/low risk.  Normal LVEF and normal LVEF preserved.  Suspect reduced myocardial blood flow reserve related to microvascular disease.  No evidence of infarction or ischemia.  Resting EF 48%.  Stress EF 54%.  Mildly enlarged LV. Asc Ao 4.1 cm--stable.  Also stable mosaic attenuation in the lungs no change.   CARDIOVERSION N/A 05/22/2013   Procedure: CARDIOVERSION;  Surgeon: Lynwood Kelsie, MD;  Location: [facility] OR;  Service: Cardiovascular;  Laterality: N/A;   CARDIOVERSION N/A 10/23/2011   Procedure: CARDIOVERSION;  Surgeon: Elspeth JAYSON Sage, MD;  Location: [facility] CATH LAB;  Service:  Cardiovascular;  Laterality: N/A;   CARDIOVERSION N/A 05/01/2012   Procedure: CARDIOVERSION;  Surgeon: Elspeth JAYSON Sage, MD;  Location: [facility] CATH LAB;  Service: Cardiovascular;  Laterality: N/A;   CARPAL TUNNEL RELEASE Right 1980s   CATARACT EXTRACTION W/ INTRAOCULAR LENS  IMPLANT, BILATERAL  2012  approx.   CORONARY BALLOON ANGIOPLASTY N/A 07/04/2021   Procedure: CORONARY BALLOON ANGIOPLASTY;  Surgeon: Anner Alm ORN, MD;  Location: [facility] INVASIVE CV LAB;  Service: Cardiovascular;; scoring balloon and an [place] balloon PTCA of LAD; with kissing balloon PTCA of LAD-D2 bifurcation stented segment reducing LAD to 20 % ISR and ostial D2 to 40%.   CORONARY PRESSURE/FFR STUDY N/A 06/28/2016   Procedure: Intravascular Pressure Wire/FFR Study;  Surgeon: Alm ORN Anner, MD;  Location: [facility] INVASIVE CV LAB;  Service: Cardiovascular: FFR of mLAD ~65% lesion = 0.75 post --> Staged PCI   CORONARY PRESSURE/FFR STUDY N/A 10/11/2020   Procedure: INTRAVASCULAR PRESSURE WIRE/FFR STUDY;  Surgeon: Anner Alm ORN, MD;  Location: [facility] INVASIVE CV LAB;  Service: Cardiovascular;  Laterality: LAD mid-distal just after D2 --> FFR 0.75 SIGNIFICANT => PCI   CORONARY STENT INTERVENTION N/A 06/28/2016   Procedure: Coronary Stent Intervention;  Surgeon: Alm ORN Anner, MD;  Location: [facility] INVASIVE CV LAB;  Service: Cardiovascular;  Laterality: mRCA PCI -Synergy DES 2.75 m x 20 mm (3.1 mm)   CORONARY STENT INTERVENTION N/A 07/10/2016   Procedure: Coronary Stent Intervention;  Surgeon: Alm ORN Anner, MD;  Location: [facility] INVASIVE CV LAB;  Service: Cardiovascular: mLAD PCI Synergy DES 3.5 mm x 20 mm)   CORONARY STENT INTERVENTION N/A 10/11/2020   Procedure: CORONARY STENT INTERVENTION;  Surgeon: Anner Alm ORN, MD;  Location: [facility] INVASIVE CV LAB;  Service: Cardiovascular: mid-distal LAD ~70% (FFR 0.75): SYNERGY DES 2.5 MM X 32 MM (overlaps prox-mid Stent from 07/2016) - Tapered post-dilation 3.7 mm ->2.6 mm; Jailed D2 PTCA reduced 60% to 40%.    CYSTOSCOPY WITH RETROGRADE PYELOGRAM, URETEROSCOPY AND STENT PLACEMENT Right 10/16/2017   Procedure: CYSTOSCOPY WITH RIGHT RETROGRADE RIGHT URETEROSCOPY AND STONE BASKET EXTRACTION;  Surgeon: Watt Rush, MD;  Location: [facility];  Service: Urology;  Laterality: Right;   CYSTOSCOPY WITH RETROGRADE PYELOGRAM, URETEROSCOPY AND STENT PLACEMENT Right 10/08/2019   Procedure: CYSTOSCOPY WITH URETEROSCOPY; RETROGRADE PYELOGRAM; STONE BASKET EXTRACTION;  STENT PLACEMENT;  Surgeon: Sherrilee Belvie CROME, MD;  Location: [facility];  Service: Urology;  Laterality: Right;  1 HR   CYSTOSCOPY WITH RETROGRADE PYELOGRAM, URETEROSCOPY AND STENT PLACEMENT Right 03/28/2020   Procedure: CYSTOSCOPY WITH RIGHT RETROGRADE PYELOGRAM, URETEROSCOPY WITH HOLMIUM LASER AND STENT PLACEMENT;  Surgeon: Watt Rush, MD;  Location: [facility];  Service: Urology;  Laterality: Right;   CYSTOSCOPY WITH RETROGRADE PYELOGRAM, URETEROSCOPY AND STENT PLACEMENT Left 10/22/2021   Procedure: CYSTOSCOPY WITH LEFT RETROGRADE URETEROSCOPY WIHT HOLMIUM LASERAND STENT PLACEMENT;  Surgeon: Watt Rush, MD;  Location: [facility];  Service: Urology;  Laterality: Left;   CYSTOSCOPY/RETROGRADE/URETEROSCOPY/STONE EXTRACTION WITH BASKET Right 10/27/2017   Procedure: CYSTOSCOPY/RETROGRADE/URETEROSCOPY/STONE EXTRACTION WITH BASKET/URETERAL STENT PLACEMENT and laser;  Surgeon: Watt Rush, MD;  Location: [facility];  Service: Urology;  Laterality: Right;   CYSTOSCOPY/URETEROSCOPY/HOLMIUM LASER/STENT PLACEMENT Right 01/03/2023   Procedure: CYSTOSCOPY RIGHT RETROGRADE URETEROSCOPY/HOLMIUM LASER/STENT PLACEMENT;  Surgeon: Watt Rush, MD;  Location: [facility];  Service: Urology;  Laterality: Right;   EXTRACORPOREAL SHOCK WAVE LITHOTRIPSY Left 08/10/2020   Procedure: EXTRACORPOREAL SHOCK WAVE LITHOTRIPSY (ESWL);  Surgeon: Cam Morene ORN, MD;  Location: [facility];  Service: Urology;  Laterality: Left;   EXTRACORPOREAL SHOCK  WAVE LITHOTRIPSY Right 12/16/2022   Procedure: RIGHT EXTRACORPOREAL SHOCK WAVE LITHOTRIPSY (ESWL);  Surgeon: Watt Rush, MD;  Location: [facility];  Service: Urology;  Laterality: Right;   HOLMIUM LASER APPLICATION Right 10/27/2017   Procedure: POSSIBLE HOLMIUM LASER APPLICATION;  Surgeon: Watt Rush, MD;  Location: [facility];  Service: Urology;  Laterality: Right;   HOLMIUM LASER APPLICATION Right 10/08/2019   Procedure: HOLMIUM LASER APPLICATION;  Surgeon: Sherrilee Belvie CROME, MD;  Location: [facility];  Service: Urology;  Laterality: Right;   INTRAOPERATIVE TRANSTHORACIC ECHOCARDIOGRAM N/A 06/10/2023   Procedure: INTRAOPERATIVE TRANSTHORACIC ECHOCARDIOGRAM;  Surgeon: Wonda Sharper, MD;  Location: [facility] INVASIVE CV LAB;  Service: Cardiovascular;  Laterality: N/A;   LEFT HEART CATH AND CORONARY ANGIOGRAPHY  06/30/2003   Dr. Morris: NO Significant/Obstructive CAD; Preseved LVEF (for Recurrent Afib)   NASAL SINUS SURGERY     x 2   NM MYOVIEW  LTD  02/11/2021   Lexiscan :EF 55-65%.  Normal function.  No ischemia or infarction.  No EKG changes noted.   PROXIMAL INTERPHALANGEAL FUSION (PIP) Left 01-05-2001    dr graves   correction clawtoe and extensor tendon lengthening   RIGHT/LEFT HEART CATH AND CORONARY ANGIOGRAPHY N/A 06/28/2016   Procedure: Right/Left Heart Cath and Coronary Angiography;  Surgeon: Alm ORN Clay, MD;  Location: [facility] INVASIVE CV LAB;  Service: Cardiovascular: mRCA 99% (TIMI 2), mLAD ~65% (FFR 0.75), OM3 55%. mild Pulm HTN. Mod LVEDP elevation. EF 55-65%.   RIGHT/LEFT HEART CATH AND CORONARY ANGIOGRAPHY N/A 10/11/2020   Procedure: RIGHT/LEFT HEART CATH AND CORONARY ANGIOGRAPHY;  Surgeon: Clay Alm ORN, MD;  Location: [facility] INVASIVE CV LAB;; Patent DES in mRCA & p-mLAD. CULPRIT = m-d LAD 70% (just after D1 w/ Ost 60%) FFR 0.75 => DES PCI m-dLAD & PTCA ost D1 (overlapping prior stent distally & crossing D2 w/  Ost D2 post PTCA). RIGHT HEART CATH PRESSURES,  &  CO-CI ARE NORMAL. NO PULM HTN   RIGHT/LEFT HEART CATH AND CORONARY ANGIOGRAPHY N/A 07/04/2021   Procedure: RIGHT/LEFT HEART CATH AND CORONARY ANGIOGRAPHY;  Surgeon: Clay Alm ORN, MD;  Location: [facility] INVASIVE CV LAB;  Service: Cardiovascular;; moderately elevated LVEDP of 20 mmHg, and PCWP-28 mmHg -> mean PAP of 24 mmHg.  Moderate AS-mean gradient 33 mmHg.  (Similar to Echo 26 mmHg); borderline low BP.  CO-CI 9.2 and 4.0.. Stable CAD W/ progression of ISR of LAD stent at D2 => PCI   RIGHT/LEFT HEART CATH AND CORONARY ANGIOGRAPHY N/A 05/27/2023   Procedure: RIGHT/LEFT HEART CATH AND CORONARY ANGIOGRAPHY;  Surgeon: Clay Alm ORN, MD;  Location: [facility] INVASIVE CV LAB;  Service: Cardiovascular;  Laterality: N/A;  ROTATOR CUFF REPAIR Bilateral last one 2014   TONSILLECTOMY  age 50   TRANSTHORACIC ECHOCARDIOGRAM  09/29/2020   EF > 75%, Hyperdynamic. No RWMA. Mild LVH. ~ DD with mild LA dilation. Mild AI. Normal RV size & fxn, ? Dilated IVC c/w CVP ~ 15 mHg.   TRANSTHORACIC ECHOCARDIOGRAM  04/12/2021   EF estimated 65%.  Normal LV size and function.  No RWMA.  Mild LVH.  GRII DD.  Elevated LVEDP (c/w HFpEF).  Normal RV size.  Mildly elevated PAP estimated 36 mmHg.  Mild MR.  Mild AS (mean gradient 26 mmHg.  DI 0.32.  Aortic root 40 mm, ascending aorta 41 mm.   URETEROSCOPIC LASER LITHOTRIPSY STONE EXTRACTIONS/  STENT PLACEMENT Bilateral 09-10-2007   dr watt  [facility]   per pt has had several prior ureteroscopic stone extraction since age 18 , last one 09-10-2007   Zio patch monitor:  02/2021   Probably Sinus Rhythm: Heart rate 56-146 bpm, average 83 bpm.  Frequent PACs noted with rare isolated PVCs.  Multiple episodes of PAT/atrial runs-longest 23 seconds average heart rate 95 bpm, fastest 5 beats at a max rate of 231 bpm.  No evidence of A-fib or atrial flutter.  No prolonged or sustained SVT/PAT.       Initial Focused Assessment (If applicable, or please see trauma documentation): Confused male presents  via EMS from home after a fall, also presents with stroke symptoms. Abrasion L forearm, hematoma L head, R shoulder pain.   Airway patent but some vomiting, BS clear Bleeding to skin abrasions controlled GCS 14 PERRLA 2  CT's Completed:   CT Head, CT Maxillofacial, CT C-Spine, CT Chest w/ contrast, and CT abdomen/pelvis w/ contrast  CT angio neck/head  Interventions:  IV start and trauma lab draw Portable chest, pelvis, bilateral shoulders, left knee, left forearm XRAY CT head, c-spine, angio head/neck, max face, CAP  Plan for disposition:  Admission to ICU   Consults completed:  Neurology Khaliqdina to bedside [phone]   Event Summary: Pt presents via EMS from home after a fall in his bathroom. Pt with stroke like symptoms, flaccid on L side, slurred speech, L neglect, ALOC. Typically alertx4 and independent. Hematoma L head, abrasion L forearm, pain to R shoulder. Pt arrives as a code stroke, neurologist met patient upon arrival and escorted pt immediately to CT. Some vomiting in in CT. Labetalol  given in CT in attempts to reduce BP. Started on cleviprex gtt and andexxa upon arrival to treatment room. Family brought to bedside. Waiting for ICU bed.   Bedside handoff with ED RN Rhoderick.    Shari Natt O Evaleigh Mccamy  Trauma Response RN  Please call TRN at [phone] for further assistance.

## 2023-10-24 NOTE — ED Triage Notes (Signed)
 Pt brought in by EMS s/p fall this morning at roughly 0130am when patient got up to go to the bathroom. Unknown LOC. (+) eloquis (unknown last dose). L sided neglect noted along with L side flaccidity. Pt altered and presenting with slurred speech. LKW 9:30pm.

## 2023-10-24 NOTE — Progress Notes (Signed)
 Pt transported from 4N20 to CT and back without complications.

## 2023-10-24 NOTE — Progress Notes (Addendum)
 STROKE TEAM PROGRESS NOTE    SIGNIFICANT HOSPITAL EVENTS  4/17: Presented to EMS, status post fall, with left side weakness and neglect, hypertensive to 190s. CT shows large 6.7 cm IPH right parietal lobe with IV extension and 3 mm midline shift Placed on 3% at 75  INTERIM HISTORY/SUBJECTIVE  Family at bedside.  RN at bedside. Family with 1 episode of vomiting.  Denied headache.  Continued on room air, protecting airway.  Patient reviewed with CCM. Repeat CT shows increase in bilateral subarachnoid, moderate to large IVH with stable midline shift. Requiring Cleviprex gtt. for blood pressure control. On exam, patient is drowsy and has complete left neglect. Patient has been started on hypertonic saline at 75 cc an hour.  Neurologic exam is unchanged with left hemiparesis and neglect right gaze preference.  OBJECTIVE  CBC    Component Value Date/Time   WBC 10.6 (H) 10/24/2023 0314   RBC 4.72 10/24/2023 0314   HGB 13.9 10/24/2023 0322   HGB 13.2 05/22/2023 0832   HCT 41.0 10/24/2023 0322   HCT 40.4 05/22/2023 0832   PLT 181 10/24/2023 0314   PLT 229 05/22/2023 0832   MCV 86.4 10/24/2023 0314   MCV 90 05/22/2023 0832   MCH 29.0 10/24/2023 0314   MCHC 33.6 10/24/2023 0314   RDW 13.3 10/24/2023 0314   RDW 13.8 05/22/2023 0832   LYMPHSABS 2.3 10/24/2023 0314   LYMPHSABS 1.3 08/06/2018 1108   MONOABS 0.9 10/24/2023 0314   EOSABS 0.3 10/24/2023 0314   EOSABS 0.0 08/06/2018 1108   BASOSABS 0.1 10/24/2023 0314   BASOSABS 0.0 08/06/2018 1108    BMET    Component Value Date/Time   NA 140 10/24/2023 0322   NA 142 05/22/2023 0832   K 4.0 10/24/2023 0322   CL 111 10/24/2023 0322   CO2 21 (L) 10/24/2023 0314   GLUCOSE 155 (H) 10/24/2023 0322   BUN 10 10/24/2023 0322   BUN 11 05/22/2023 0832   CREATININE 1.10 10/24/2023 0322   CALCIUM  9.8 10/24/2023 0314   EGFR 69 05/22/2023 0832   GFRNONAA >60 10/24/2023 0314    IMAGING past 24 hours CT CHEST ABDOMEN PELVIS WO  CONTRAST Result Date: 10/24/2023 CLINICAL DATA:  78 year old male status post fall and intracranial hemorrhage. Level 2 trauma. EXAM: CT CHEST, ABDOMEN AND PELVIS WITHOUT CONTRAST TECHNIQUE: Multidetector CT imaging of the chest, abdomen and pelvis was performed following the standard protocol without IV contrast. RADIATION DOSE REDUCTION: This exam was performed according to the departmental dose-optimization program which includes automated exposure control, adjustment of the mA and/or kV according to patient size and/or use of iterative reconstruction technique. COMPARISON:  CTA chest, Abdomen, and Pelvis 06/03/2023. FINDINGS: CT CHEST FINDINGS Cardiovascular: Calcified aortic atherosclerosis. TAVR. Coronary artery stents. Stable heart size, upper limits of normal. No pericardial effusion. Vascular patency is not evaluated in the absence of IV contrast. Mediastinum/Nodes: No evidence of hematoma, mass, lymphadenopathy in the noncontrast mediastinum. Lungs/Pleura: Lower lung volumes. Atelectatic changes to the major airways, and respiratory motion artifact in the lungs. Underlying chronic lung disease with subpleural scarring and interstitial thickening in both lungs. Progressed bilateral sub solid lung opacity, ground-glass and increased conspicuity of pulmonary septal thickening, especially in the upper lungs. This appears out of proportion to the degree of lower lung volumes. Only mild dependent atelectasis. No pleural effusion. No consolidation. Musculoskeletal: Chronic appearing distal left clavicle fracture. Multilevel thoracic vertebral interbody ankylosis from flowing endplate osteophytes. And upper thoracic probable developing interspinous ankylosis from interspinous ligament calcification  or ossification. No sternal or rib fracture identified. No acute osseous abnormality identified. CT ABDOMEN PELVIS FINDINGS Hepatobiliary: Negative noncontrast liver and gallbladder. Pancreas: Partial atrophy. Spleen:  Mild motion artifact, negative. Adrenals/Urinary Tract: Adrenal glands appear symmetric and normal. Both kidneys are excreting previously administered IV contrast. There is new since February left hydronephrosis and left hydroureter in the abdomen. The left ureter then abruptly tapers as it crosses into the pelvis, with no obstructing calculus or etiology evident. No asymmetric delayed nephrogram on that side. Right renal collecting system and ureter appear normal. Contrast in the urinary bladder. Stomach/Bowel: Diverticulosis of the distal descending colon and sigmoid. No active inflammation identified. Occasional right colon diverticula also. Diminutive or absent appendix. No large bowel inflammation identified. Nondilated small bowel. Small volume retained fluid in the stomach. Negative duodenum. No pneumoperitoneum. No free fluid. Mild motion artifact, no convincing mesenteric inflammation. Vascular/Lymphatic: Aortoiliac calcified atherosclerosis. Stable and normal caliber abdominal aorta. Vascular patency is not evaluated in the absence of IV contrast. No lymphadenopathy identified. Reproductive: Negative. Other: No pelvis free fluid. Musculoskeletal: Chronic L5 pars defects with mild grade 1 spondylolisthesis of L5 on S1. Stable lumbar vertebral height. Lumbar vertebrae, sacrum, SI joints, pelvis and proximal femurs appear stable and intact. No superficial soft tissue injury identified. IMPRESSION: 1. No acute traumatic injury identified in the Noncontrast chest, abdomen, or pelvis. 2. Acute bilateral pulmonary ground-glass opacity superimposed on chronic interstitial lung disease. Consider mild or developing pulmonary edema. No pleural effusion. Viral/atypical respiratory infection felt less likely. 3. New Left hydronephrosis and left hydroureter since February. The left ureter then abruptly tapers, becomes decompressed at the left pelvic inlet. No obstructing calculus or etiology is evident. And etiology and  significance of this finding is unclear. 4.  Aortic Atherosclerosis (ICD10-I70.0).  TAVR. Electronically Signed   By: VEAR Hurst M.D.   On: 10/24/2023 05:15   DG Shoulder Right Portable Result Date: 10/24/2023 CLINICAL DATA:  78 year old male status post fall and intracranial hemorrhage. Level 2 trauma. EXAM: RIGHT SHOULDER - 1 VIEW COMPARISON:  Chest radiographs 0346 hours today and earlier. FINDINGS: Three views at 0341 hours. No glenohumeral joint dislocation. Proximal right humerus intact. Inferior glenoid and humeral head degenerative spurring. Right clavicle and scapula appear intact. Visible left ribs appear intact, stable visible chest including TAVR. IMPRESSION: No acute fracture or dislocation identified about the right shoulder. Glenohumeral joint degeneration. Electronically Signed   By: VEAR Hurst M.D.   On: 10/24/2023 04:19   DG Shoulder Left Portable Result Date: 10/24/2023 CLINICAL DATA:  78 year old male status post fall and intracranial hemorrhage. Level 2 trauma. EXAM: LEFT SHOULDER COMPARISON:  Portable chest 0346 hours today and earlier. FINDINGS: Three views 0327 hours. No glenohumeral joint dislocation. Superior subluxation of the left humeral head appears degenerative. Proximal left humerus intact. Left AC joint appears stable. No acute left clavicle or scapula fracture identified. TAVR is visible in the left chest. Visible left ribs appear intact. IMPRESSION: 1. No acute fracture or dislocation identified about the left shoulder. 2. Evidence of rotator cuff pathology. Electronically Signed   By: VEAR Hurst M.D.   On: 10/24/2023 04:18   DG Knee Left Port Result Date: 10/24/2023 CLINICAL DATA:  78 year old male status post fall and intracranial hemorrhage. Level 2 trauma. EXAM: PORTABLE LEFT KNEE - 1-2 VIEW COMPARISON:  None Available. FINDINGS: Two views 0352 hours. Calcified peripheral vascular disease. Bone mineralization is within normal limits for age. Maintained alignment. Mild for age  degeneration, mild medial compartment joint space  loss. No joint effusion identified. No acute osseous abnormality identified. No discrete soft tissue injury. IMPRESSION: 1. No acute fracture or dislocation identified about the left knee. 2. Calcified peripheral vascular disease. Electronically Signed   By: VEAR Hurst M.D.   On: 10/24/2023 04:16   DG Forearm Left Result Date: 10/24/2023 CLINICAL DATA:  78 year old male status post fall and intracranial hemorrhage. Level 2 trauma. EXAM: LEFT FOREARM - 2 VIEW COMPARISON:  None Available. FINDINGS: Two views 0355 hours. Bone mineralization is within normal limits for age. Alignment appears maintained at the left wrist and elbow. Calcified peripheral vascular disease. No left radius or ulna fracture or dislocation identified. No discrete soft tissue injury. IMPRESSION: No acute fracture or dislocation identified about the left forearm. Electronically Signed   By: VEAR Hurst M.D.   On: 10/24/2023 04:15   DG Pelvis Portable Result Date: 10/24/2023 CLINICAL DATA:  78 year old male status post fall and intracranial hemorrhage. Level 2 trauma. EXAM: PORTABLE PELVIS 1-2 VIEWS COMPARISON:  CT Abdomen and Pelvis 02/21/2023. FINDINGS: Portable AP supine view at 0351 hours. Femoral heads normally located. Pelvis appears stable and intact. Grossly intact proximal femurs. Iliofemoral calcified atherosclerosis. Small volume excreted IV contrast in the urinary bladder. Nonobstructive bowel gas pattern. IMPRESSION: No acute fracture or dislocation identified about the pelvis. Electronically Signed   By: VEAR Hurst M.D.   On: 10/24/2023 04:13   DG Chest Portable 1 View Result Date: 10/24/2023 EXAM: 1 VIEW XRAY OF THE CHEST 10/24/2023 04:02:00 AM COMPARISON: 06/21/2023. Stable cardiac enlargement. Aortic atherosclerosis. Status post TAVR. CLINICAL HISTORY: Fall. Level 2 trauma, code stroke. FINDINGS: LUNGS AND PLEURA: Bilateral lower lung zone opacities. Upper lung zones appear clear. No  pleural effusion. No pneumothorax. HEART AND MEDIASTINUM: Stable cardiac enlargement. Aortic atherosclerosis. Status post TAVR. BONES AND SOFT TISSUES: No acute osseous abnormality. Asymmetric elevation of right hemidiaphragm. IMPRESSION: 1. Bilateral lower lung zone opacities. No pleural effusion or pneumothorax. 2. Stable cardiac enlargement, aortic atherosclerosis, and status post TAVR. 3. Asymmetric elevation of right hemidiaphragm. Electronically signed by: Waddell Calk MD 10/24/2023 04:13 AM EDT RP Workstation: [id number]   CT C-SPINE NO CHARGE Result Date: 10/24/2023 CLINICAL DATA:  78 year old male code stroke presentation, right parietal lobe intra-axial hemorrhage. Fell. EXAM: CT CERVICAL SPINE WITH CONTRAST TECHNIQUE: Multiplanar CT images of the cervical spine were reconstructed from contemporary CTA of the Neck. RADIATION DOSE REDUCTION: This exam was performed according to the departmental dose-optimization program which includes automated exposure control, adjustment of the mA and/or kV according to patient size and/or use of iterative reconstruction technique. CONTRAST:  No additional COMPARISON:  CT head and face today reported separately. FINDINGS: Alignment: Relatively preserved cervical lordosis. Cervicothoracic junction alignment is within normal limits. Bilateral posterior element alignment is within normal limits. Skull base and vertebrae: Visualized skull base is intact. No atlanto-occipital dissociation. C1 and C2 appear intact and aligned. No acute osseous abnormality identified. Soft tissues and spinal canal: No prevertebral fluid or swelling. No visible canal hematoma. Neck CTA reported separately. Disc levels: Bulky interspinous ligament ossification beginning at C5 and continuing through the visible upper thoracic spine. Developing upper thoracic ankylosis suspected. Superimposed ordinary chronic disc and endplate degeneration at C3-C4. Up to moderate spinal stenosis suspected  there. Upper chest: Visible upper thoracic levels appear intact. Negative lung apices. IMPRESSION: 1. No acute traumatic injury identified in the cervical spine. 2. Chronic disc and endplate degeneration at C3-C4 with moderate spinal stenosis suspected. 3. Bulky interspinous ligament ossification in the lower cervical and  visible upper thoracic spine with developing upper thoracic ankylosis. Electronically Signed   By: VEAR Hurst M.D.   On: 10/24/2023 03:59   CT ANGIO HEAD NECK W WO CM (CODE STROKE) Result Date: 10/24/2023 CLINICAL DATA:  Neuro deficit, acute, stroke suspected EXAM: CT ANGIOGRAPHY HEAD AND NECK WITH AND WITHOUT CONTRAST TECHNIQUE: Multidetector CT imaging of the head and neck was performed using the standard protocol during bolus administration of intravenous contrast. Multiplanar CT image reconstructions and MIPs were obtained to evaluate the vascular anatomy. Carotid stenosis measurements (when applicable) are obtained utilizing NASCET criteria, using the distal internal carotid diameter as the denominator. RADIATION DOSE REDUCTION: This exam was performed according to the departmental dose-optimization program which includes automated exposure control, adjustment of the mA and/or kV according to patient size and/or use of iterative reconstruction technique. CONTRAST:  75mL OMNIPAQUE  IOHEXOL  350 MG/ML SOLN COMPARISON:  None Available. FINDINGS: CTA NECK FINDINGS Aortic arch: Great vessel origins are patent without significant stenosis Right carotid system: No evidence of dissection, stenosis (50% or greater), or occlusion. Left carotid system: No evidence of dissection, stenosis (50% or greater), or occlusion. Vertebral arteries: Codominant. No evidence of dissection, stenosis (50% or greater), or occlusion. Skeleton: Negative. Other neck: Negative. Upper chest: Visualized lung apices are clear. Review of the MIP images confirms the above findings CTA HEAD FINDINGS Anterior circulation: Bilateral  intracranial ICAs, MCAs, and ACAs are patent without proximal high-grade stenosis. No aneurysm or arteriovenous malformation identified. Multiple blushes of contrast within the large right parietal intraparenchymal hemorrhage, compatible with active extravasation (for example see series 6, images 43, 52 and 56). Posterior circulation: Bilateral intradural vertebral arteries, basilar artery and bilateral posterior cerebral arteries are patent without proximal hemodynamically significant stenosis. No aneurysm identified. Venous sinuses: As permitted by contrast timing, patent. Review of the MIP images confirms the above findings IMPRESSION: 1. Multiple foci of active extravasation in the large right parietal intraparenchymal hemorrhage. 2. No aneurysm or arteriovenous malformation identified; however, acute blood products limits assessment. 3. No large vessel occlusion or proximal hemodynamically significant stenosis. Findings discussed with Dr. VANESSA via telephone at 3:55 a.m. Electronically Signed   By: Gilmore GORMAN Molt M.D.   On: 10/24/2023 03:58   CT Maxillofacial Wo Contrast Result Date: 10/24/2023 CLINICAL DATA:  78 year old male code stroke presentation, right parietal lobe intra-axial hemorrhage. Fell. EXAM: CT MAXILLOFACIAL WITHOUT CONTRAST TECHNIQUE: Multidetector CT imaging of the maxillofacial structures was performed. Multiplanar CT image reconstructions were also generated. RADIATION DOSE REDUCTION: This exam was performed according to the departmental dose-optimization program which includes automated exposure control, adjustment of the mA and/or kV according to patient size and/or use of iterative reconstruction technique. COMPARISON:  CT head and cervical spine today reported separately. FINDINGS: Osseous: Mandible intact and normally located. No acute dental finding identified. Bilateral maxilla, zygoma, pterygoid bones appear intact. Bilateral mildly displaced nasal bone fractures, age  indeterminate. Visible skull base and cervical vertebrae appear intact and aligned. Orbits: No orbital wall fracture. Postoperative changes to both globes. Intraorbital soft tissues appear normal. Sinuses: Scattered mild paranasal sinus mucosal thickening and small retention cysts. No layering sinus fluid or hemorrhage. Tympanic cavities and mastoids are clear. Soft tissues: Negative visible noncontrast larynx, pharynx, parapharyngeal spaces, retropharyngeal space, sublingual space, submandibular spaces, masticator and parotid spaces. No posttraumatic soft tissue gas identified. No upper cervical lymphadenopathy. Limited intracranial: Scalp hematoma and intracranial hemorrhage are reported separately today. IMPRESSION: 1. Bilateral mildly displaced nasal bone fractures, age indeterminate. 2. No other acute traumatic injury identified  in the Face. Electronically Signed   By: VEAR Hurst M.D.   On: 10/24/2023 03:56   CT HEAD CODE STROKE WO CONTRAST Result Date: 10/24/2023 CLINICAL DATA:  Code stroke.  Neuro deficit, acute, stroke suspected EXAM: CT HEAD WITHOUT CONTRAST TECHNIQUE: Contiguous axial images were obtained from the base of the skull through the vertex without intravenous contrast. RADIATION DOSE REDUCTION: This exam was performed according to the departmental dose-optimization program which includes automated exposure control, adjustment of the mA and/or kV according to patient size and/or use of iterative reconstruction technique. COMPARISON:  CT head Aug 23, 2017. FINDINGS: Brain: Large (6.7 x 4.8 x 6.0 cm) acute intraparenchymal hemorrhage centered in the right parietal lobe with multiple smaller surrounding satellite acute hemorrhages. There is intraventricular extension of hemorrhage which is layering within the left occipital horn. There is mass effect with approximately 3 mm of leftward midline shift. No evidence of acute large vascular territory infarct or mass lesion, although the acute blood  products limits assessment. Patchy white matter hypodensities are compatible with chronic microvascular ischemic disease. Vascular: No hyperdense vessel identified. Calcific atherosclerosis. Skull: Normal. Negative for fracture or focal lesion. Sinuses/Orbits: No acute finding. ASPECTS [facility] Stroke Program Early CT Score) Total score (0-10 with 10 being normal): 10. IMPRESSION: Large 6.7 cm intraparenchymal hemorrhage centered in the right parietal lobe with intraventricular extension and 3 mm of leftward midline shift. Code stroke imaging results were communicated on 10/24/2023 at 3:33 am to provider Dr. Vanessa via telephone, who verbally acknowledged these results. Electronically Signed   By: Gilmore GORMAN Molt M.D.   On: 10/24/2023 03:34    Vitals:   10/24/23 0730 10/24/23 0740 10/24/23 0745 10/24/23 0800  BP: 129/62  (!) 130/57 136/62  Pulse: 80  83 84  Resp: (!) 29  (!) 25 14  Temp:  97.7 F (36.5 C)    TempSrc:  Axillary    SpO2: 98%  99% (!) 86%  Weight:      Height:        PHYSICAL EXAM General: Critically ill patient in no acute distress CV: Regular rate and rhythm on monitor Respiratory:  Regular, unlabored respirations on room air  NEURO:  Mental Status: Drowsy.  Oriented to self only.  Decreased attention. Complete left neglect.  Speech/Language: No expressive aphasia or dysarthria noted.  Patient's responses limited by severe drowsiness  Cranial Nerves:  II: PERRL.  Left hemianopsia, no blink to threat on left. III, IV, VI: Right gaze preference, does not cross midline. V: Sensation is intact to light touch and symmetrical to face.  VII: Face is symmetrical resting and smiling VIII: hearing intact to voice. IX, X: Palate elevates symmetrically. Phonation is normal.  KP:Dynloizm shrug 5/5. XII: tongue is midline without fasciculations. Motor: LUE: 0/5, no withdrawal noted.  LLE: 2/5 with slight withdrawal noted  Tone: is normal and bulk is normal Sensation-  Absent LUE. Complete neglect on left.  Coordination: FTN unable to do on left.  Gait- deferred  Most Recent NIH: 64    ASSESSMENT/PLAN  [name] is a 78 y.o. male with hx of CAD, GERD, DM2, Afibb on eliquis , who was last seen normal at 2130 on 10/23/23 when he went to bed. He got up around 0140 and stumbled around the bed and walked over to the bathroom. He was having difficulty with moving his left arm and seemed to not be recognizing the left arm.  He had a fall after using the bathroom with complete left-sided  weakness and unable to stand up.  He hit his head pretty hard with the left forehead scalp hematoma.  He was brought in as a code stroke as well as a trauma alert.  CT head without contrast demonstrates a large 6.7 cm intraparenchymal hemorrhage with intraventricular extension and 3 mm leftward midline shift.  CT angio demonstrates spot sign with noted contrast extravasation left hematoma.  Patient took his Eliquis  at 8:30 PM last night and is also in hypertensive emergency with systolic of over 809d at presentation.  He was not deemed a candidate for TNKase or thrombectomy he is on Eliquis  and he has a ICH. ICH score of 1 for IVH.  NIH on admission: 17.  Intracerebral Hemorrhage with IV extension, cerebral edema, midline shift:  right parietal s/p Eliquis  reversal with Andexxa Etiology: Pending continued workup, likely due to hypertensive emergency and oral anticoagulation status post fall CT Head without contrast(Personally reviewed): Large 6.7 cm intraparenchymal hemorrhage centered in the right parietal lobe with intraventricular extension and 3 mm of leftward midline shift. CT angio Head and Neck with contrast(Personally reviewed): Positive for spot sign with active contrast extravasation. Repeat CTH: Substantially increased since 0321 hours bilateral Subarachnoid Hemorrhage, including suspected isodense SAH throughout the posterior fossa. Large right hemisphere  intra-axial hematoma is less circumscribed now, not significantly changed.  Probable new small hemorrhagic contusions also in the right hemisphere including the superior frontal gyrus. Moderate to large volume of IVH. Stable vents size and configuration. Stable midline shift of 1-2 mm. No skull fracture identified. Broad-based left anterior convexity scalp hematoma Repeat CTH ordered for 7/19AM   2D Echo 4/72025: EF 65-70%, Mild LVH, Grad II diastolic dysfunction, Moderately dilated LA/RA, Mild MVR, Noted Aortic Dilatation. (Patient with known history of Afib) LDL 80 HgbA1c 6.3 VTE prophylaxis -SCDs Eliquis  prior to admission, now on No antithrombotic due to ICH Therapy recommendations:  Pending Disposition: Pending   Cerebral Edema Midline Shift Continue 3% Hyprtonic Salne at 85ml/hr Na checsk Q6H Na 139-140 Goal Na 150-155 bolus given 7/17, will give x1 7/18.  Atrial fibrillation Home Meds: Eliquis  5mg  BID, Metoprolol   Continue metoprolol  Continue telemetry monitoring Hold anticoagulation due to large ICH  Hypertension Home meds:  none Unstable Cleviprex gtt., wean as able. IV labetalol  and hydralazine  as needed's Blood Pressure Goal: SBP between 130-150 for 24 hours and then less than 160   Hyperlipidemia Home meds: Crestor  40 mg LDL 80, goal < 70 Change to Lipitor 80mg , start when able to take PO Continue statin at discharge  Diabetes type II Controlled Home meds: Metformin  500 mg daily, Jardiance  12.5 mg daily HgbA1c 6.3, goal < 7.0 CBGs SSI Recommend close follow-up with PCP  Tobacco Abuse Patient is a former cigar smoker Patient formally used chewing tobacco and snuff  Dysphagia Patient has post-stroke dysphagia, SLP consulted    Diet   Diet NPO time specified   Advance diet as tolerated  Other Stroke Risk Factors Family hx stroke (paternal grandfather) Coronary artery disease Congestive heart failure OSA  Other Active Problems History  of interstitial lung disease History of anxiety Home medication: Hydroxyzine  10 mg History of dementia Home medication: Namenda   Hospital day # 0   Pt seen by Neuro NP/APP with MD. Note/plan to be edited by MD as needed.    Rocky JAYSON Likes, DNP, AGACNP-BC [facility] Please use AMION for contact information & EPIC for messaging.  I have personally obtained history,examined this patient, reviewed notes, independently viewed imaging studies, participated in  medical decision making and plan of care.ROS completed by me personally and pertinent positives fully documented  I have made any additions or clarifications directly to the above note. Agree with note above.  Patient presented with a fall with left-sided weakness and neglect due to large right parietal parenchymal intracerebral hemorrhage while on Eliquis  for A-fib.  Follow-up scan shows persistent cytotoxic edema and stable hemorrhage with some subarachnoid blood.  Recommend close neurological observation and strict blood pressure control with systolic goal between 130-150 for the first 24 hours and then below 160.  Hypertonic saline drip with serum sodium goal 150-155.  Patient may need elective intubation if he has difficulty protecting airway.  He will unfortunately have to hold anticoagulation due to his intracerebral hemorrhage and he remains at risk for recurrent thromboembolism and strokes due to his A-fib.  Long discussion with patient and wife at the bedside and answered questions.  Discussed with Dr. Gretta critical care medicine. This patient is critically ill and at significant risk of neurological worsening, death and care requires constant monitoring of vital signs, hemodynamics,respiratory and cardiac monitoring, extensive review of multiple databases, frequent neurological assessment, discussion with family, other specialists and medical decision making of high complexity.I have made any additions or clarifications  directly to the above note.This critical care time does not reflect procedure time, or teaching time or supervisory time of PA/NP/Med Resident etc but could involve care discussion time.  I spent 40 minutes of neurocritical care time  in the care of  this patient.      Eather Popp, MD Medical Director [facility] Pager: [phone] 10/24/2023 4:24 PM   To contact Stroke Continuity provider, please refer to [email or web address]. After hours, contact General Neurology

## 2023-10-24 NOTE — ED Triage Notes (Signed)
 Hematoma noted to left forehead, abrasion noted to left forearm, pain reported in right shoulder.

## 2023-10-24 NOTE — Code Documentation (Signed)
 Stroke Response Nurse Documentation Code Documentation  Kenneth [name] is a 78 y.o. male arriving to [facility]  via [facility] on 7/18 with past medical hx of CAD, GERD, DM2, Afibb . On Eliquis  (apixaban ) daily. Code stroke was activated by EMS.   Patient from home where he was LKW at 2130 and now complaining of left sided weakness.  Stroke team at the bedside on patient arrival. Labs drawn and patient cleared for CT by Dr. Carita. Patient to CT with team. NIHSS 17, see documentation for details and code stroke times. Patient with disoriented, not following commands, right gaze preference , left hemianopia, left arm weakness, left leg weakness, left decreased sensation, and left neglect on exam. The following imaging was completed:  CT Head and CTA. Patient is not a candidate for IV Thrombolytic due to ICH. Patient is not a candidate for IR due to ICH.   Plan-Clevi and Andexxa. SBP goal 110-130.   Bedside handoff with ED RN Reino Griselda Alm LELON  Rapid Response RN

## 2023-10-24 NOTE — Progress Notes (Signed)
 RN updated Dr. Vanessa. Patient tachypneic with respiration rate in the 40s and 50s, unlabored. Lung sounds clear/diminished. Oxygen saturation 96%. MD also made aware that patient's level of consciousness seems to be less than what was previously documented/told to this RN on report by day shift RN. MD made aware that patient is not currently following commands for this RN.

## 2023-10-24 NOTE — Progress Notes (Signed)
 ABG reported to E-link.

## 2023-10-24 NOTE — Progress Notes (Signed)
 Phlebotomy notified that patient is a lab draw and has a sodium due.

## 2023-10-24 NOTE — Progress Notes (Signed)
 OT Cancellation Note  Patient Details Name: Kenneth [name] MRN: [id number] DOB: 04/16/1945   Cancelled Treatment:    Reason Eval/Treat Not Completed: Active bedrest order (ICH BR, OT evaluation to follow up as activity orders progress)  [name] 10/24/2023, 7:11 AM

## 2023-10-24 NOTE — Progress Notes (Signed)
 eLink Physician-Brief Progress Note Patient Name: CASANOVA SCHURMAN DOB: 10-Aug-1945 MRN: [id number]   Date of Service  10/24/2023  HPI/Events of Note  Reviewed ABG. Pt with combined respiratory and metabolic acidosis, more than adequate oxygenation.  Currently on vent support, TV 580. PT maintaining RR 28.   eICU Interventions  OK to maintain on current vent settings.  Will give sodium bicarbonate IV to help address metabolic acidosis.         Tristine Langi M DELA CRUZ 10/24/2023, 11:28 PM

## 2023-10-24 NOTE — ED Provider Notes (Signed)
 [place] EMERGENCY DEPARTMENT AT [facility] Provider Note   CSN: [id number] Arrival date & time: 10/24/23  9686     Patient presents with: Code Stroke and Trauma   Kenneth [name] is a 78 y.o. male.   78 y.o. male with medical history significant for CAD s/p PCI, PAF on Eliquis , HFpEF (60-65%), severe AS s/p TAVR (06/10/2023), ILD on chronic prednisone  5 mg daily, T2DM, HTN, HLD, psoriatic arthritis on adalimumab   Presents as code stroke.  Last seen normal approximately 9 PM.  His wife heard him go to the bathroom proximately 1:30 AM when he appeared to be stumbling.  He was having difficulty using his left arm and is asking his wife who his left hand was.  He was found to have weakness to his left side and then of had a fall while coming back from the bathroom.  Last known well was 9:30 PM.  Did strike head.  Unknown loss of consciousness.  Does take Eliquis  for history of atrial fibrillation.  Also has TAVR.  On arrival he has weakness to the left side with neglect.  Code stroke on arrival with Dr. Vanessa  The history is provided by the patient.       Prior to Admission medications   Medication Sig Start Date End Date Taking? Authorizing Provider  acetaminophen  (TYLENOL ) 500 MG tablet Take 1,000 mg by mouth every 6 (six) hours as needed for mild pain (pain score 1-3) or moderate pain (pain score 4-6).    [provider]  Adalimumab-bwwd (HADLIMA) 40 MG/0.4ML SOSY Inject 40 mg into the skin every 14 (fourteen) days.    [provider]  amoxicillin  (AMOXIL ) 500 MG tablet Take 4 tablets (2,000 mg total) by mouth as directed. 1 hour prior to dental work including cleanings Patient taking differently: Take 2,000 mg by mouth daily as needed (For dental cleaning only). 06/16/23   Sebastian Lamarr JONELLE, PA-C  apixaban  (ELIQUIS ) 5 MG TABS tablet Take 1 tablet (5 mg total) by mouth 2 (two) times daily. 01/12/21   Fenton, Clint R, PA  cetirizine (ZYRTEC) 10 MG  tablet Take 10 mg by mouth daily.    [provider]  Dorzolamide  HCl-Timolol  Mal PF 2-0.5 % SOLN Place 1 drop into both eyes at bedtime.    [provider]  empagliflozin  (JARDIANCE ) 25 MG TABS tablet Take 12.5 mg by mouth daily. 12/26/21   [provider]  ferrous sulfate  325 (65 FE) MG tablet Take 325 mg by mouth every Monday, Wednesday, and Friday.    Emelia Josefa HERO, NP  furosemide  (LASIX ) 20 MG tablet Take 20 mg by mouth See admin instructions. Take 1 tablet (20mg ) twice a week on Monday and Thursday.    [provider]  galantamine  (RAZADYNE  ER) 24 MG 24 hr capsule Take 24 mg by mouth in the morning. 08/30/20   [provider]  memantine  (NAMENDA ) 10 MG tablet Take 20 mg by mouth at bedtime. 08/30/20   [provider]  metFORMIN  (GLUCOPHAGE ) 500 MG tablet Take 500 mg by mouth at bedtime.    [provider]  Multiple Vitamins-Minerals (MULTIVITAMIN GUMMIES MENS) CHEW Chew 1 each by mouth daily.    [provider]  omeprazole  (PRILOSEC  OTC) 20 MG tablet Take 20 mg by mouth daily as needed (Acid reflux).    [provider]  ranolazine  (RANEXA ) 500 MG 12 hr tablet Take 2 tablets ( 1000 mg) in the morning  and 1 tablet (  500 mg) in the evening 09/29/23   Anner Alm ORN, MD  rosuvastatin  (CRESTOR ) 40 MG tablet Take 1 tablet (40 mg total) by mouth daily. 12/28/21   Anner Alm ORN, MD  tamsulosin (FLOMAX) 0.4 MG CAPS capsule Take 1 capsule by mouth once daily 03/13/21   McKenzie, Belvie CROME, MD    Allergies: Hydrocodone , Other, Oxycontin  [oxycodone ], Sudafed [pseudoephedrine], Ultram  [tramadol ], and Lopid [gemfibrozil]    Review of Systems  Unable to perform ROS: Mental status change    Updated Vital Signs Ht 6' 2 (1.88 m)   Wt 93.2 kg   SpO2 99%   BMI 26.38 kg/m   Physical Exam Vitals and nursing note reviewed.  Constitutional:      General: He is not in acute distress.    Appearance: He is well-developed.      Comments: Ill-appearing, vomiting on arrival  HENT:     Head: Normocephalic.     Comments: Hematoma and abrasion left forehead    Mouth/Throat:     Pharynx: No oropharyngeal exudate.  Eyes:     Conjunctiva/sclera: Conjunctivae normal.     Pupils: Pupils are equal, round, and reactive to light.  Neck:     Comments: No meningismus. Cardiovascular:     Rate and Rhythm: Normal rate and regular rhythm.     Heart sounds: Normal heart sounds. No murmur heard. Pulmonary:     Effort: Pulmonary effort is normal. No respiratory distress.     Breath sounds: Normal breath sounds.  Abdominal:     Palpations: Abdomen is soft.     Tenderness: There is no abdominal tenderness. There is no guarding or rebound.  Musculoskeletal:        General: Tenderness present.     Cervical back: Normal range of motion and neck supple.     Comments: Pain with manipulation of right shoulder.  Skin tear left forearm. Full range of motion of hips bilaterally without pain  Skin:    General: Skin is warm.  Neurological:     Mental Status: He is alert.     Cranial Nerves: Cranial nerve deficit present.     Motor: Weakness present. No abnormal muscle tone.     Coordination: Coordination normal.     Comments: No facial droop.  Tongue is midline.  Neglect on the left with hemianopsia.  Right gaze preference.  Flaccid left upper extremity.  Able to hold left leg off the bed some.  5/5 strength on the right. Oriented to person and place.  Psychiatric:        Behavior: Behavior normal.     (all labs ordered are listed, but only abnormal results are displayed) Labs Reviewed  PROTIME-INR - Abnormal; Notable for the following components:      Result Value   Prothrombin Time 21.7 (*)    INR 1.8 (*)    All other components within normal limits  APTT - Abnormal; Notable for the following components:   aPTT 42 (*)    All other components within normal limits  CBC - Abnormal; Notable for the following components:    WBC 10.6 (*)    All other components within normal limits  DIFFERENTIAL - Abnormal; Notable for the following components:   Abs Immature Granulocytes 0.53 (*)    All other components within normal limits  COMPREHENSIVE METABOLIC PANEL WITH GFR - Abnormal; Notable for the following components:   CO2 21 (*)    Glucose, Bld 153 (*)    Total Protein >12.0 (*)  AST 42 (*)    All other components within normal limits  I-STAT CHEM 8, ED - Abnormal; Notable for the following components:   Glucose, Bld 155 (*)    Calcium , Ion 1.05 (*)    All other components within normal limits  CBG MONITORING, ED - Abnormal; Notable for the following components:   Glucose-Capillary 163 (*)    All other components within normal limits  ETHANOL  SODIUM  SODIUM  SODIUM  SODIUM    EKG: None  Radiology: CT HEAD POST STROKE FOLLOWUP/TIMED/STAT READ Result Date: 10/24/2023 CLINICAL DATA:  78 year old male status post fall and intracranial hemorrhage. Intra-axial right hemisphere hemorrhage, on Eliquis . Intraventricular extension, and positive CTA spot sign. EXAM: CT HEAD WITHOUT CONTRAST TECHNIQUE: Contiguous axial images were obtained from the base of the skull through the vertex without intravenous contrast. RADIATION DOSE REDUCTION: This exam was performed according to the departmental dose-optimization program which includes automated exposure control, adjustment of the mA and/or kV according to patient size and/or use of iterative reconstruction technique. COMPARISON:  Plain head CT 0321 hours today. FINDINGS: Brain: Extensive heterogeneous hemorrhage in the posterior right hemisphere, epicenter at the right parietal lobe. Intra-axial blood appears less circumscribed now. And evidence of new small foci of superior right hemisphere hemorrhagic contusion (such as on series 5, image 36 measuring 12 mm. Difficult to exclude similar new contusion in the right mesial temporal lobe on series 5, image 38, although  intraventricular blood has increased in the right temporal horn, confounding the appearance. Overall size and configuration of the right hemisphere hematoma not significantly changed. However, substantially increased right greater than left subarachnoid hemorrhage, including suspected isodense subarachnoid blood in the basilar cisterns (series 3, image 14). Additionally, moderate to large volume intraventricular extension of blood. Ventricle size and configuration not significantly changed. Mild mass effect on the right lateral ventricle and leftward midline shift of 1-2 mm appears stable. No tonsillar herniation. No left hemisphere or posterior fossa intra-axial hemorrhage identified. No definite subdural blood. Vascular: Residual intravascular contrast. Skull: No fracture identified. Sinuses/Orbits: Visualized paranasal sinuses and mastoids are stable and well aerated. Other: Broad-based left anterior convexity scalp hematoma. Underlying calvarium appears intact. Stable orbits soft tissues. IMPRESSION: 1. Substantially increased since 0321 hours bilateral Subarachnoid Hemorrhage, including suspected isodense SAH throughout the posterior fossa. 2. Large right hemisphere intra-axial hematoma is less circumscribed now, but has not significantly changed. 3. But probable new small hemorrhagic contusions also in the right hemisphere including the superior frontal gyrus. 4. Moderate to large volume of IVH. Stable vents size and configuration. 5. Stable midline shift of 1-2 mm. 6. No skull fracture identified. Broad-based left anterior convexity scalp hematoma. Salient findings were communicated to Dr. Rosemarie at [phone] hours on 10/24/2023 by text page via the [facility] messaging system. Electronically Signed   By: VEAR Hurst M.D.   On: 10/24/2023 09:27   CT CHEST ABDOMEN PELVIS WO CONTRAST Result Date: 10/24/2023 CLINICAL DATA:  78 year old male status post fall and intracranial hemorrhage. Level 2 trauma. EXAM: CT CHEST, ABDOMEN  AND PELVIS WITHOUT CONTRAST TECHNIQUE: Multidetector CT imaging of the chest, abdomen and pelvis was performed following the standard protocol without IV contrast. RADIATION DOSE REDUCTION: This exam was performed according to the departmental dose-optimization program which includes automated exposure control, adjustment of the mA and/or kV according to patient size and/or use of iterative reconstruction technique. COMPARISON:  CTA chest, Abdomen, and Pelvis 06/03/2023. FINDINGS: CT CHEST FINDINGS Cardiovascular: Calcified aortic atherosclerosis. TAVR. Coronary artery stents. Stable  heart size, upper limits of normal. No pericardial effusion. Vascular patency is not evaluated in the absence of IV contrast. Mediastinum/Nodes: No evidence of hematoma, mass, lymphadenopathy in the noncontrast mediastinum. Lungs/Pleura: Lower lung volumes. Atelectatic changes to the major airways, and respiratory motion artifact in the lungs. Underlying chronic lung disease with subpleural scarring and interstitial thickening in both lungs. Progressed bilateral sub solid lung opacity, ground-glass and increased conspicuity of pulmonary septal thickening, especially in the upper lungs. This appears out of proportion to the degree of lower lung volumes. Only mild dependent atelectasis. No pleural effusion. No consolidation. Musculoskeletal: Chronic appearing distal left clavicle fracture. Multilevel thoracic vertebral interbody ankylosis from flowing endplate osteophytes. And upper thoracic probable developing interspinous ankylosis from interspinous ligament calcification or ossification. No sternal or rib fracture identified. No acute osseous abnormality identified. CT ABDOMEN PELVIS FINDINGS Hepatobiliary: Negative noncontrast liver and gallbladder. Pancreas: Partial atrophy. Spleen: Mild motion artifact, negative. Adrenals/Urinary Tract: Adrenal glands appear symmetric and normal. Both kidneys are excreting previously administered IV  contrast. There is new since February left hydronephrosis and left hydroureter in the abdomen. The left ureter then abruptly tapers as it crosses into the pelvis, with no obstructing calculus or etiology evident. No asymmetric delayed nephrogram on that side. Right renal collecting system and ureter appear normal. Contrast in the urinary bladder. Stomach/Bowel: Diverticulosis of the distal descending colon and sigmoid. No active inflammation identified. Occasional right colon diverticula also. Diminutive or absent appendix. No large bowel inflammation identified. Nondilated small bowel. Small volume retained fluid in the stomach. Negative duodenum. No pneumoperitoneum. No free fluid. Mild motion artifact, no convincing mesenteric inflammation. Vascular/Lymphatic: Aortoiliac calcified atherosclerosis. Stable and normal caliber abdominal aorta. Vascular patency is not evaluated in the absence of IV contrast. No lymphadenopathy identified. Reproductive: Negative. Other: No pelvis free fluid. Musculoskeletal: Chronic L5 pars defects with mild grade 1 spondylolisthesis of L5 on S1. Stable lumbar vertebral height. Lumbar vertebrae, sacrum, SI joints, pelvis and proximal femurs appear stable and intact. No superficial soft tissue injury identified. IMPRESSION: 1. No acute traumatic injury identified in the Noncontrast chest, abdomen, or pelvis. 2. Acute bilateral pulmonary ground-glass opacity superimposed on chronic interstitial lung disease. Consider mild or developing pulmonary edema. No pleural effusion. Viral/atypical respiratory infection felt less likely. 3. New Left hydronephrosis and left hydroureter since February. The left ureter then abruptly tapers, becomes decompressed at the left pelvic inlet. No obstructing calculus or etiology is evident. And etiology and significance of this finding is unclear. 4.  Aortic Atherosclerosis (ICD10-I70.0).  TAVR. Electronically Signed   By: VEAR Hurst M.D.   On: 10/24/2023 05:15    DG Shoulder Right Portable Result Date: 10/24/2023 CLINICAL DATA:  78 year old male status post fall and intracranial hemorrhage. Level 2 trauma. EXAM: RIGHT SHOULDER - 1 VIEW COMPARISON:  Chest radiographs 0346 hours today and earlier. FINDINGS: Three views at 0341 hours. No glenohumeral joint dislocation. Proximal right humerus intact. Inferior glenoid and humeral head degenerative spurring. Right clavicle and scapula appear intact. Visible left ribs appear intact, stable visible chest including TAVR. IMPRESSION: No acute fracture or dislocation identified about the right shoulder. Glenohumeral joint degeneration. Electronically Signed   By: VEAR Hurst M.D.   On: 10/24/2023 04:19   DG Shoulder Left Portable Result Date: 10/24/2023 CLINICAL DATA:  78 year old male status post fall and intracranial hemorrhage. Level 2 trauma. EXAM: LEFT SHOULDER COMPARISON:  Portable chest 0346 hours today and earlier. FINDINGS: Three views 0327 hours. No glenohumeral joint dislocation. Superior subluxation of the left  humeral head appears degenerative. Proximal left humerus intact. Left AC joint appears stable. No acute left clavicle or scapula fracture identified. TAVR is visible in the left chest. Visible left ribs appear intact. IMPRESSION: 1. No acute fracture or dislocation identified about the left shoulder. 2. Evidence of rotator cuff pathology. Electronically Signed   By: VEAR Hurst M.D.   On: 10/24/2023 04:18   DG Knee Left Port Result Date: 10/24/2023 CLINICAL DATA:  78 year old male status post fall and intracranial hemorrhage. Level 2 trauma. EXAM: PORTABLE LEFT KNEE - 1-2 VIEW COMPARISON:  None Available. FINDINGS: Two views 0352 hours. Calcified peripheral vascular disease. Bone mineralization is within normal limits for age. Maintained alignment. Mild for age degeneration, mild medial compartment joint space loss. No joint effusion identified. No acute osseous abnormality identified. No discrete soft tissue  injury. IMPRESSION: 1. No acute fracture or dislocation identified about the left knee. 2. Calcified peripheral vascular disease. Electronically Signed   By: VEAR Hurst M.D.   On: 10/24/2023 04:16   DG Forearm Left Result Date: 10/24/2023 CLINICAL DATA:  78 year old male status post fall and intracranial hemorrhage. Level 2 trauma. EXAM: LEFT FOREARM - 2 VIEW COMPARISON:  None Available. FINDINGS: Two views 0355 hours. Bone mineralization is within normal limits for age. Alignment appears maintained at the left wrist and elbow. Calcified peripheral vascular disease. No left radius or ulna fracture or dislocation identified. No discrete soft tissue injury. IMPRESSION: No acute fracture or dislocation identified about the left forearm. Electronically Signed   By: VEAR Hurst M.D.   On: 10/24/2023 04:15   DG Pelvis Portable Result Date: 10/24/2023 CLINICAL DATA:  78 year old male status post fall and intracranial hemorrhage. Level 2 trauma. EXAM: PORTABLE PELVIS 1-2 VIEWS COMPARISON:  CT Abdomen and Pelvis 02/21/2023. FINDINGS: Portable AP supine view at 0351 hours. Femoral heads normally located. Pelvis appears stable and intact. Grossly intact proximal femurs. Iliofemoral calcified atherosclerosis. Small volume excreted IV contrast in the urinary bladder. Nonobstructive bowel gas pattern. IMPRESSION: No acute fracture or dislocation identified about the pelvis. Electronically Signed   By: VEAR Hurst M.D.   On: 10/24/2023 04:13   DG Chest Portable 1 View Result Date: 10/24/2023 EXAM: 1 VIEW XRAY OF THE CHEST 10/24/2023 04:02:00 AM COMPARISON: 06/21/2023. Stable cardiac enlargement. Aortic atherosclerosis. Status post TAVR. CLINICAL HISTORY: Fall. Level 2 trauma, code stroke. FINDINGS: LUNGS AND PLEURA: Bilateral lower lung zone opacities. Upper lung zones appear clear. No pleural effusion. No pneumothorax. HEART AND MEDIASTINUM: Stable cardiac enlargement. Aortic atherosclerosis. Status post TAVR. BONES AND SOFT  TISSUES: No acute osseous abnormality. Asymmetric elevation of right hemidiaphragm. IMPRESSION: 1. Bilateral lower lung zone opacities. No pleural effusion or pneumothorax. 2. Stable cardiac enlargement, aortic atherosclerosis, and status post TAVR. 3. Asymmetric elevation of right hemidiaphragm. Electronically signed by: Waddell Calk MD 10/24/2023 04:13 AM EDT RP Workstation: [id number]   CT C-SPINE NO CHARGE Result Date: 10/24/2023 CLINICAL DATA:  78 year old male code stroke presentation, right parietal lobe intra-axial hemorrhage. Fell. EXAM: CT CERVICAL SPINE WITH CONTRAST TECHNIQUE: Multiplanar CT images of the cervical spine were reconstructed from contemporary CTA of the Neck. RADIATION DOSE REDUCTION: This exam was performed according to the departmental dose-optimization program which includes automated exposure control, adjustment of the mA and/or kV according to patient size and/or use of iterative reconstruction technique. CONTRAST:  No additional COMPARISON:  CT head and face today reported separately. FINDINGS: Alignment: Relatively preserved cervical lordosis. Cervicothoracic junction alignment is within normal limits. Bilateral posterior element alignment is within  normal limits. Skull base and vertebrae: Visualized skull base is intact. No atlanto-occipital dissociation. C1 and C2 appear intact and aligned. No acute osseous abnormality identified. Soft tissues and spinal canal: No prevertebral fluid or swelling. No visible canal hematoma. Neck CTA reported separately. Disc levels: Bulky interspinous ligament ossification beginning at C5 and continuing through the visible upper thoracic spine. Developing upper thoracic ankylosis suspected. Superimposed ordinary chronic disc and endplate degeneration at C3-C4. Up to moderate spinal stenosis suspected there. Upper chest: Visible upper thoracic levels appear intact. Negative lung apices. IMPRESSION: 1. No acute traumatic injury identified in the  cervical spine. 2. Chronic disc and endplate degeneration at C3-C4 with moderate spinal stenosis suspected. 3. Bulky interspinous ligament ossification in the lower cervical and visible upper thoracic spine with developing upper thoracic ankylosis. Electronically Signed   By: VEAR Hurst M.D.   On: 10/24/2023 03:59   CT ANGIO HEAD NECK W WO CM (CODE STROKE) Result Date: 10/24/2023 CLINICAL DATA:  Neuro deficit, acute, stroke suspected EXAM: CT ANGIOGRAPHY HEAD AND NECK WITH AND WITHOUT CONTRAST TECHNIQUE: Multidetector CT imaging of the head and neck was performed using the standard protocol during bolus administration of intravenous contrast. Multiplanar CT image reconstructions and MIPs were obtained to evaluate the vascular anatomy. Carotid stenosis measurements (when applicable) are obtained utilizing NASCET criteria, using the distal internal carotid diameter as the denominator. RADIATION DOSE REDUCTION: This exam was performed according to the departmental dose-optimization program which includes automated exposure control, adjustment of the mA and/or kV according to patient size and/or use of iterative reconstruction technique. CONTRAST:  75mL OMNIPAQUE  IOHEXOL  350 MG/ML SOLN COMPARISON:  None Available. FINDINGS: CTA NECK FINDINGS Aortic arch: Great vessel origins are patent without significant stenosis Right carotid system: No evidence of dissection, stenosis (50% or greater), or occlusion. Left carotid system: No evidence of dissection, stenosis (50% or greater), or occlusion. Vertebral arteries: Codominant. No evidence of dissection, stenosis (50% or greater), or occlusion. Skeleton: Negative. Other neck: Negative. Upper chest: Visualized lung apices are clear. Review of the MIP images confirms the above findings CTA HEAD FINDINGS Anterior circulation: Bilateral intracranial ICAs, MCAs, and ACAs are patent without proximal high-grade stenosis. No aneurysm or arteriovenous malformation identified. Multiple  blushes of contrast within the large right parietal intraparenchymal hemorrhage, compatible with active extravasation (for example see series 6, images 43, 52 and 56). Posterior circulation: Bilateral intradural vertebral arteries, basilar artery and bilateral posterior cerebral arteries are patent without proximal hemodynamically significant stenosis. No aneurysm identified. Venous sinuses: As permitted by contrast timing, patent. Review of the MIP images confirms the above findings IMPRESSION: 1. Multiple foci of active extravasation in the large right parietal intraparenchymal hemorrhage. 2. No aneurysm or arteriovenous malformation identified; however, acute blood products limits assessment. 3. No large vessel occlusion or proximal hemodynamically significant stenosis. Findings discussed with Dr. VANESSA via telephone at 3:55 a.m. Electronically Signed   By: Gilmore GORMAN Molt M.D.   On: 10/24/2023 03:58   CT Maxillofacial Wo Contrast Result Date: 10/24/2023 CLINICAL DATA:  78 year old male code stroke presentation, right parietal lobe intra-axial hemorrhage. Fell. EXAM: CT MAXILLOFACIAL WITHOUT CONTRAST TECHNIQUE: Multidetector CT imaging of the maxillofacial structures was performed. Multiplanar CT image reconstructions were also generated. RADIATION DOSE REDUCTION: This exam was performed according to the departmental dose-optimization program which includes automated exposure control, adjustment of the mA and/or kV according to patient size and/or use of iterative reconstruction technique. COMPARISON:  CT head and cervical spine today reported separately. FINDINGS: Osseous: Mandible intact and  normally located. No acute dental finding identified. Bilateral maxilla, zygoma, pterygoid bones appear intact. Bilateral mildly displaced nasal bone fractures, age indeterminate. Visible skull base and cervical vertebrae appear intact and aligned. Orbits: No orbital wall fracture. Postoperative changes to both  globes. Intraorbital soft tissues appear normal. Sinuses: Scattered mild paranasal sinus mucosal thickening and small retention cysts. No layering sinus fluid or hemorrhage. Tympanic cavities and mastoids are clear. Soft tissues: Negative visible noncontrast larynx, pharynx, parapharyngeal spaces, retropharyngeal space, sublingual space, submandibular spaces, masticator and parotid spaces. No posttraumatic soft tissue gas identified. No upper cervical lymphadenopathy. Limited intracranial: Scalp hematoma and intracranial hemorrhage are reported separately today. IMPRESSION: 1. Bilateral mildly displaced nasal bone fractures, age indeterminate. 2. No other acute traumatic injury identified in the Face. Electronically Signed   By: VEAR Hurst M.D.   On: 10/24/2023 03:56   CT HEAD CODE STROKE WO CONTRAST Result Date: 10/24/2023 CLINICAL DATA:  Code stroke.  Neuro deficit, acute, stroke suspected EXAM: CT HEAD WITHOUT CONTRAST TECHNIQUE: Contiguous axial images were obtained from the base of the skull through the vertex without intravenous contrast. RADIATION DOSE REDUCTION: This exam was performed according to the departmental dose-optimization program which includes automated exposure control, adjustment of the mA and/or kV according to patient size and/or use of iterative reconstruction technique. COMPARISON:  CT head Aug 23, 2017. FINDINGS: Brain: Large (6.7 x 4.8 x 6.0 cm) acute intraparenchymal hemorrhage centered in the right parietal lobe with multiple smaller surrounding satellite acute hemorrhages. There is intraventricular extension of hemorrhage which is layering within the left occipital horn. There is mass effect with approximately 3 mm of leftward midline shift. No evidence of acute large vascular territory infarct or mass lesion, although the acute blood products limits assessment. Patchy white matter hypodensities are compatible with chronic microvascular ischemic disease. Vascular: No hyperdense vessel  identified. Calcific atherosclerosis. Skull: Normal. Negative for fracture or focal lesion. Sinuses/Orbits: No acute finding. ASPECTS [facility] Stroke Program Early CT Score) Total score (0-10 with 10 being normal): 10. IMPRESSION: Large 6.7 cm intraparenchymal hemorrhage centered in the right parietal lobe with intraventricular extension and 3 mm of leftward midline shift. Code stroke imaging results were communicated on 10/24/2023 at 3:33 am to provider Dr. Vanessa via telephone, who verbally acknowledged these results. Electronically Signed   By: Gilmore GORMAN Molt M.D.   On: 10/24/2023 03:34     .Critical Care  Performed by: Carita Senior, MD Authorized by: Carita Senior, MD   Critical care provider statement:    Critical care time (minutes):  60   Critical care time was exclusive of:  Separately billable procedures and treating other patients   Critical care was necessary to treat or prevent imminent or life-threatening deterioration of the following conditions:  Trauma and CNS failure or compromise   Critical care was time spent personally by me on the following activities:  Development of treatment plan with patient or surrogate, discussions with consultants, evaluation of patient's response to treatment, examination of patient, ordering and review of laboratory studies, ordering and review of radiographic studies, ordering and performing treatments and interventions, pulse oximetry, re-evaluation of patient's condition, review of old charts, blood draw for specimens and obtaining history from patient or surrogate   I assumed direction of critical care for this patient from another provider in my specialty: no      Medications Ordered in the ED  sodium chloride  flush (NS) 0.9 % injection 3 mL (has no administration in time range)  coag fact Xa recombinant (  ANDEXXA) low dose infusion 900 mg (has no administration in time range)   stroke: early stages of recovery book (has no  administration in time range)  acetaminophen  (TYLENOL ) tablet 650 mg (has no administration in time range)    Or  acetaminophen  (TYLENOL ) 160 MG/5ML solution 650 mg (has no administration in time range)    Or  acetaminophen  (TYLENOL ) suppository 650 mg (has no administration in time range)  senna-docusate (Senokot-S) tablet 1 tablet (has no administration in time range)  pantoprazole  (PROTONIX ) injection 40 mg (has no administration in time range)  labetalol  (NORMODYNE ) injection 20 mg (has no administration in time range)    And  clevidipine (CLEVIPREX) infusion 0.5 mg/mL (has no administration in time range)                                    Medical Decision Making Amount and/or Complexity of Data Reviewed Independent Historian: EMS Labs: ordered. Decision-making details documented in ED Course. Radiology: ordered and independent interpretation performed. Decision-making details documented in ED Course. ECG/medicine tests: ordered and independent interpretation performed. Decision-making details documented in ED Course.  Risk Prescription drug management. Decision regarding hospitalization.   Patient from home with fall likely secondary to acute CVA.  Code stroke on arrival.  New left-sided weakness with neglect last seen normal 9:30 PM.  Did fall and suffer head injury on Eliquis .  Protecting airway on arrival.  Taken to CT scan.  Large intraparenchymal hematoma seen CT head without contrast demonstrates a large 6.7 cm intraparenchymal hemorrhage with intraventricular extension and 3 mm leftward midline shift. CT angio demonstrates spot sign with noted contrast extravasation left hematoma.   Emergent reversal of Eliquis  ordered. Patient given Cleviprex and labetalol  for blood pressure control.  Per neurology goal less than 130.    He continues to protect airway.  Discussed with Dr. Vanessa.  Will not intubate at this time.  No longer vomiting.  He will need admission to the  ICU.  Neurology also planning to give hypertonic saline.  Remainder of traumatic imaging is negative for acute traumatic injury. Does show pulmonary edema and L sided hydronephrosis.  C-spine is negative for acute traumatic injury.  Facial CT shows nasal fracture of uncertain acuity.  No other facial fractures.  Traumatic imaging as above shows pulmonary edema left-sided hydronephrosis without distinct stone  Continues to protect airway on multiple rechecks and is answering questions. Will hold intubation at this time. Dr. Vanessa agrees. Family desires him to be a full code.   ICU admission to stroke team. Critical care to consult.        Final diagnoses:  Cerebral brain hemorrhage [facility])    ED Discharge Orders     None          Carita Senior, MD 10/24/23 1540

## 2023-10-25 ENCOUNTER — Other Ambulatory Visit (HOSPITAL_COMMUNITY)

## 2023-10-25 ENCOUNTER — Inpatient Hospital Stay (HOSPITAL_COMMUNITY)

## 2023-10-25 DIAGNOSIS — G936 Cerebral edema: Secondary | ICD-10-CM

## 2023-10-25 DIAGNOSIS — I5032 Chronic diastolic (congestive) heart failure: Secondary | ICD-10-CM | POA: Diagnosis not present

## 2023-10-25 DIAGNOSIS — J9600 Acute respiratory failure, unspecified whether with hypoxia or hypercapnia: Secondary | ICD-10-CM

## 2023-10-25 DIAGNOSIS — I161 Hypertensive emergency: Secondary | ICD-10-CM | POA: Diagnosis not present

## 2023-10-25 DIAGNOSIS — J9601 Acute respiratory failure with hypoxia: Secondary | ICD-10-CM | POA: Diagnosis not present

## 2023-10-25 DIAGNOSIS — I609 Nontraumatic subarachnoid hemorrhage, unspecified: Secondary | ICD-10-CM | POA: Diagnosis not present

## 2023-10-25 DIAGNOSIS — Z87891 Personal history of nicotine dependence: Secondary | ICD-10-CM

## 2023-10-25 DIAGNOSIS — R739 Hyperglycemia, unspecified: Secondary | ICD-10-CM

## 2023-10-25 DIAGNOSIS — I619 Nontraumatic intracerebral hemorrhage, unspecified: Secondary | ICD-10-CM | POA: Diagnosis not present

## 2023-10-25 DIAGNOSIS — I615 Nontraumatic intracerebral hemorrhage, intraventricular: Secondary | ICD-10-CM | POA: Diagnosis not present

## 2023-10-25 DIAGNOSIS — I69391 Dysphagia following cerebral infarction: Secondary | ICD-10-CM

## 2023-10-25 DIAGNOSIS — S0634AA Traumatic hemorrhage of right cerebrum with loss of consciousness status unknown, initial encounter: Secondary | ICD-10-CM | POA: Diagnosis not present

## 2023-10-25 LAB — BASIC METABOLIC PANEL WITH GFR
Anion gap: 15 (ref 5–15)
BUN: 20 mg/dL (ref 8–23)
CO2: 20 mmol/L — ABNORMAL LOW (ref 22–32)
Calcium: 8.4 mg/dL — ABNORMAL LOW (ref 8.9–10.3)
Chloride: 122 mmol/L — ABNORMAL HIGH (ref 98–111)
Creatinine, Ser: 1.22 mg/dL (ref 0.61–1.24)
GFR, Estimated: >60 mL/min (ref >60)
Glucose, Bld: 140 mg/dL — ABNORMAL HIGH (ref 70–99)
Potassium: 3.7 mmol/L (ref 3.5–5.1)
Sodium: 157 mmol/L — ABNORMAL HIGH (ref 135–145)

## 2023-10-25 LAB — PHOSPHORUS: Phosphorus: 3.2 mg/dL (ref 2.5–4.6)

## 2023-10-25 LAB — CBC
HCT: 33.1 % — ABNORMAL LOW (ref 39.0–52.0)
Hemoglobin: 11.4 g/dL — ABNORMAL LOW (ref 13.0–17.0)
MCH: 30.9 pg (ref 26.0–34.0)
MCHC: 34.4 g/dL (ref 30.0–36.0)
MCV: 89.7 fL (ref 80.0–100.0)
Platelets: 63 K/uL — ABNORMAL LOW (ref 150–400)
RBC: 3.69 MIL/uL — ABNORMAL LOW (ref 4.22–5.81)
RDW: 14.6 % (ref 11.5–15.5)
WBC: 12.7 K/uL — ABNORMAL HIGH (ref 4.0–10.5)
nRBC: 0.2 % (ref 0.0–0.2)

## 2023-10-25 LAB — LIPID PANEL
Cholesterol: 185 mg/dL (ref 0–200)
HDL: 29 mg/dL — ABNORMAL LOW (ref >40)
LDL Cholesterol: UNDETERMINED mg/dL (ref 0–99)
Total CHOL/HDL Ratio: 6.4 ratio
Triglycerides: 880 mg/dL — ABNORMAL HIGH (ref <150)
VLDL: UNDETERMINED mg/dL (ref 0–40)

## 2023-10-25 LAB — T4, FREE: Free T4: 0.7 ng/dL (ref 0.61–1.12)

## 2023-10-25 LAB — TSH: TSH: 0.977 u[IU]/mL (ref 0.350–4.500)

## 2023-10-25 LAB — HEMOGLOBIN A1C
Hgb A1c MFr Bld: 6.1 % — ABNORMAL HIGH (ref 4.8–5.6)
Mean Plasma Glucose: 128.37 mg/dL

## 2023-10-25 LAB — LDL CHOLESTEROL, DIRECT: Direct LDL: 84 mg/dL (ref 0–99)

## 2023-10-25 LAB — MAGNESIUM: Magnesium: 2.3 mg/dL (ref 1.7–2.4)

## 2023-10-25 MED ORDER — FENTANYL BOLUS VIA INFUSION
100.0000 ug | INTRAVENOUS | Status: DC | PRN
  Administered 2023-10-25 (×7): 100 ug via INTRAVENOUS

## 2023-10-25 MED ORDER — POLYVINYL ALCOHOL 1.4 % OP SOLN: 1.0000 [drp] | Freq: Four times a day (QID) | OPHTHALMIC | Status: DC | PRN

## 2023-10-25 MED ORDER — ONDANSETRON HCL 4 MG/2ML IJ SOLN: 4.0000 mg | Freq: Four times a day (QID) | INTRAMUSCULAR | Status: DC | PRN

## 2023-10-25 MED ORDER — MIDAZOLAM-SODIUM CHLORIDE 100-0.9 MG/100ML-% IV SOLN
0.0000 mg/h | INTRAVENOUS | Status: DC
  Administered 2023-10-25: 1 mg/h via INTRAVENOUS
  Filled 2023-10-25: qty 100

## 2023-10-25 MED ORDER — GLYCOPYRROLATE 0.2 MG/ML IJ SOLN
0.2000 mg | INTRAMUSCULAR | Status: DC | PRN
  Administered 2023-10-25: 0.2 mg via INTRAVENOUS
  Filled 2023-10-25: qty 1

## 2023-10-25 MED ORDER — ACETAMINOPHEN 650 MG RE SUPP: 650.0000 mg | Freq: Four times a day (QID) | RECTAL | Status: DC | PRN

## 2023-10-25 MED ORDER — DIPHENHYDRAMINE HCL 50 MG/ML IJ SOLN: 25.0000 mg | INTRAMUSCULAR | Status: DC | PRN

## 2023-10-25 MED ORDER — SODIUM CHLORIDE 0.9 % IV SOLN: INTRAVENOUS | Status: DC

## 2023-10-25 MED ORDER — MIDAZOLAM BOLUS VIA INFUSION (WITHDRAWAL LIFE SUSTAINING TX)
2.0000 mg | INTRAVENOUS | Status: DC | PRN
Start: 2023-10-25 — End: 2023-10-25
  Administered 2023-10-25 (×8): 2 mg via INTRAVENOUS

## 2023-10-25 MED ORDER — MIDAZOLAM HCL 2 MG/2ML IJ SOLN: 1.0000 mg | INTRAMUSCULAR | Status: DC | PRN

## 2023-10-25 MED ORDER — HALOPERIDOL LACTATE 5 MG/ML IJ SOLN: 2.5000 mg | INTRAMUSCULAR | Status: DC | PRN

## 2023-10-25 MED ORDER — FENTANYL 2500MCG IN NS 250ML (10MCG/ML) PREMIX INFUSION
0.0000 ug/h | INTRAVENOUS | Status: DC
  Administered 2023-10-25: 50 ug/h via INTRAVENOUS
  Filled 2023-10-25: qty 250

## 2023-10-25 MED ORDER — SENNOSIDES-DOCUSATE SODIUM 8.6-50 MG PO TABS: 1.0000 | ORAL_TABLET | Freq: Two times a day (BID) | ORAL | Status: DC

## 2023-10-25 MED ORDER — ONDANSETRON 4 MG PO TBDP: 4.0000 mg | ORAL_TABLET | Freq: Four times a day (QID) | ORAL | Status: DC | PRN

## 2023-10-25 MED ORDER — GLYCOPYRROLATE 0.2 MG/ML IJ SOLN: 0.2000 mg | INTRAMUSCULAR | Status: DC | PRN

## 2023-10-25 MED ORDER — GLYCOPYRROLATE 1 MG PO TABS: 1.0000 mg | ORAL_TABLET | ORAL | Status: DC | PRN

## 2023-10-25 MED ORDER — IPRATROPIUM-ALBUTEROL 0.5-2.5 (3) MG/3ML IN SOLN: 3.0000 mL | RESPIRATORY_TRACT | Status: DC | PRN

## 2023-10-25 MED ORDER — ACETAMINOPHEN 325 MG PO TABS: 650.0000 mg | ORAL_TABLET | Freq: Four times a day (QID) | ORAL | Status: DC | PRN

## 2023-10-25 MED ORDER — PREDNISONE 5 MG PO TABS
5.0000 mg | ORAL_TABLET | Freq: Every day | ORAL | Status: DC
  Filled 2023-10-25 (×2): qty 1

## 2023-11-03 ENCOUNTER — Telehealth: Payer: Self-pay | Admitting: Cardiology

## 2023-11-03 NOTE — Telephone Encounter (Signed)
 FYI Wife called in and wanted Dr Anner and Nurse Reena to know pt passed away on 2023/11/08

## 2023-11-07 NOTE — Progress Notes (Signed)
 NAME:  Kenneth [name], MRN:  [id number], DOB:  Mar 23, 1946, LOS: 1 ADMISSION DATE:  10/24/2023, CONSULTATION DATE:  10/24/2023 REFERRING MD:  Vanessa, neurology CHIEF COMPLAINT:  respiratory failure    History of Present Illness:  78 year old male with past medical history of type 2 diabetes, hypertension, hyperlipidemia, GERD, CAD s/p PCI RCA 2018, AS s/p TAVR 06/2023, HFpEF, paroxysmal atrial fibrillation on Eliquis  , ILD, OSA who presented to the ED 7/18 early AM as a code stroke/trauma after wife found him confused with L sided weakness after a fall, LKW 7/17 @ 2130. Head CT showed large R parietal ICH with intraventricular extension, mass effect with 3mm leftward MLS. He was admitted to ICU by stroke team and started on Cleviprex , HTS 250cc bolus with maintenance 75cc/hr HTS and Eliquis  was reversed with Andexxa . He had 6h f/u CT head which demonstrated substantially increased bilateral SAH, large volume IVH, stable MLS. Per stroke team, over the course of today has become less arousable with increasing RR. CCM was consulted shortly after 8PM for evaluation of elective intubation as the patient is tachypneic into the 50s and difficult to arouse.   On exam, he is very somnolent. He is breathing 50x/min with paradoxical respirations. Wife is at bedside. She is understandably concerned.   Pertinent  Medical History  type 2 diabetes, hypertension, hyperlipidemia, GERD, CAD s/p PCI RCA 2018, AS s/p TAVR 06/2023, HFpEF, paroxysmal atrial fibrillation on Eliquis  , ILD, OSA  Significant Hospital Events: Including procedures, antibiotic start and stop dates in addition to other pertinent events   7/18: code stroke/trauma>ICH/IPH/IVH started on HTS > repeat CT head worse > more obtunded and resp failure > CCM consulted for intubation   Interim History / Subjective:  Family desires comfort care.  Objective   Blood pressure (!) 117/55, pulse 93, temperature 99.9 F (37.7 C), temperature source  Axillary, resp. rate (!) 27, height 6' 2 (1.88 m), weight 94.2 kg, SpO2 97%.    Vent Mode: PRVC FiO2 (%):  [40 %-100 %] 40 % Set Rate:  [16 bmp] 16 bmp Vt Set:  [580 mL] 580 mL PEEP:  [5 cmH20] 5 cmH20 Plateau Pressure:  [18 cmH20-21 cmH20] 21 cmH20   Intake/Output Summary (Last 24 hours) at 11/11/23 0723 Last data filed at 11/11/2023 0700 Gross per 24 hour  Intake 3183.14 ml  Output 2850 ml  Net 333.14 ml   Filed Weights   10/24/23 0300 10/24/23 0457 November 11, 2023 0500  Weight: 93.2 kg 93.2 kg 94.2 kg    Examination: General: elderly man lying in bed intubated, sedated  HENT: ETT in place  Lungs:  synchronous with MV Abdomen: nondistended  Neuro: sedated on propofol   7.23/50/525/21 TG 800 CT head personally reviewed> large SAH, IPH R parietal. IVH with loss of patency of basilar cisterns. Downward and central intracranial mass effect.  Resolved Hospital Problem list    Assessment & Plan:  Acute R Parietal ICH with intraventricular extension, SAH, and midline shift. Now occluded basilar cisterns.  Brain compression Acute hypoxic respiratory failure 2/2 large ICH, likely a component of aspiration pneumonitis History of ILD - Fibrotic NSIP  History of OSA   Hypertension Hyperlipidemia  CAD s/p PCI RCA 2018 AS s/p TAVR 06/2023 Chronic HFpEF Paroxysmal atrial fibrillation on Eliquis  Type 2 diabetes; A1c 6.1  End of life care Anemia due to acute illness  Best Practice (right click and Reselect all SmartList Selections daily)  Below per primary  Diet/type: NPO DVT prophylaxis: SCD GI prophylaxis: PPI Lines: na  Foley:  na Code Status:  full code Last date of multidisciplinary goals of care discussion [per primary]  Labs   CBC: Recent Labs  Lab 10/24/23 0314 10/24/23 0322 10/24/23 2208  WBC 10.6*  --   --   NEUTROABS 6.4  --   --   HGB 13.7 13.9 12.6*  HCT 40.8 41.0 37.0*  MCV 86.4  --   --   PLT 181  --   --     Basic Metabolic Panel: Recent Labs   Lab 10/24/23 0314 10/24/23 0322 10/24/23 0844 10/24/23 1500 10/24/23 2208 10/24/23 2321  NA 139 140 141 145 154* 152*  K 3.9 4.0  --   --  3.6  --   CL 105 111  --   --   --   --   CO2 21*  --   --   --   --   --   GLUCOSE 153* 155*  --   --   --   --   BUN 10 10  --   --   --   --   CREATININE 1.04 1.10  --   --   --   --   CALCIUM  9.8  --   --   --   --   --    GFR: Estimated Creatinine Clearance: 64.3 mL/min (by C-G formula based on SCr of 1.1 mg/dL). Recent Labs  Lab 10/24/23 0314  WBC 10.6*       Critical care time:      Leita SHAUNNA Gaskins, DO 11-16-23 7:55 AM [facility]  For contact information, see Amion. If no response to pager, please call PCCM consult pager. After hours, 7PM- 7AM, please call Elink.

## 2023-11-07 NOTE — Progress Notes (Signed)
 PT going to CT per RN, will have to be completed on morning shift due to current time

## 2023-11-07 NOTE — Progress Notes (Addendum)
 STROKE TEAM PROGRESS NOTE    SIGNIFICANT HOSPITAL EVENTS  7/17: Presented via EMS, status post fall, with left side weakness and neglect, hypertensive to 190s. CT shows large 6.7 cm IPH right parietal lobe with IV extension and 3 mm midline shift Placed on 3% at 75 7/18: Repeat CT shows increase in bilateral subarachnoid, moderate to large IVH with stable midline shift. Requiring Cleviprex  gtt. for blood pressure control.  2057: Intubated d/t increased lethargy and failure to protect airway  Update: 10:49 PM. Reviewed CT Head, notable for worsening midling shift, worsening lateral and third ventricle hydrocephalus and worsening SAH overlying the left hemisphere.  INTERIM HISTORY/SUBJECTIVE  Family at bedside.  RN at bedside. Patient has been transitioned to comfort care. They are awaiting additional family members before terminal extubation.  Discussed with family members and answered their questions. Will stop 3% saline, wean down BP parameters and continue supportive care. Discontinue NIH/Neuro checks.   OBJECTIVE  CBC    Component Value Date/Time   WBC 10.6 (H) 10/24/2023 0314   RBC 4.72 10/24/2023 0314   HGB 12.6 (L) 10/24/2023 2208   HGB 13.2 05/22/2023 0832   HCT 37.0 (L) 10/24/2023 2208   HCT 40.4 05/22/2023 0832   PLT 181 10/24/2023 0314   PLT 229 05/22/2023 0832   MCV 86.4 10/24/2023 0314   MCV 90 05/22/2023 0832   MCH 29.0 10/24/2023 0314   MCHC 33.6 10/24/2023 0314   RDW 13.3 10/24/2023 0314   RDW 13.8 05/22/2023 0832   LYMPHSABS 2.3 10/24/2023 0314   LYMPHSABS 1.3 08/06/2018 1108   MONOABS 0.9 10/24/2023 0314   EOSABS 0.3 10/24/2023 0314   EOSABS 0.0 08/06/2018 1108   BASOSABS 0.1 10/24/2023 0314   BASOSABS 0.0 08/06/2018 1108    BMET    Component Value Date/Time   NA 152 (H) 10/24/2023 2321   NA 142 05/22/2023 0832   K 3.6 10/24/2023 2208   CL 111 10/24/2023 0322   CO2 21 (L) 10/24/2023 0314   GLUCOSE 155 (H) 10/24/2023 0322   BUN 10 10/24/2023  0322   BUN 11 05/22/2023 0832   CREATININE 1.10 10/24/2023 0322   CALCIUM  9.8 10/24/2023 0314   EGFR 69 05/22/2023 0832   GFRNONAA >60 10/24/2023 0314    IMAGING past 24 hours CT HEAD WO CONTRAST ( ) Addendum Date: 21-Nov-2023 ADDENDUM REPORT: 11/21/2023 05:52 ADDENDUM: And this case was discussed by telephone with Dr. Vanessa on November 21, 2023 at 05:52 . Electronically Signed   By: VEAR Hurst M.D.   On: 21-Nov-2023 05:52   Result Date: 11-21-2023 CLINICAL DATA:  78 year old male status post fall with intracranial hemorrhage. Intra-axial hematoma with positive CTA spot sign. Subarachnoid hemorrhage. On Eliquis . EXAM: CT HEAD WITHOUT CONTRAST TECHNIQUE: Contiguous axial images were obtained from the base of the skull through the vertex without intravenous contrast. RADIATION DOSE REDUCTION: This exam was performed according to the departmental dose-optimization program which includes automated exposure control, adjustment of the mA and/or kV according to patient size and/or use of iterative reconstruction technique. COMPARISON:  Head CT 2155 hours last night and earlier. FINDINGS: Brain: Abundant intracranial hemorrhage. Large volume of bilateral hyperdense subarachnoid hemorrhage now, increased from 09011 hours yesterday, no significant change since 20/1 55 hours yesterday. Superimposed 6-7 cm area of right posterior hemisphere highly heterogeneous intra-axial blood. Some areas of superficial cortical hemorrhagic contusion along the margins of that hemorrhage. Moderate to large volume of intraventricular hemorrhage has not significantly changed from yesterday morning. Temporal horn size also does not appear  significantly changed. Third ventricle size is stable. However, since presentation on 10/24/2023 there is substantial worsening of basilar cistern patency diffusely. Fourth ventricle also poorly delineated now. And some definite cerebellar and posterior fossa subarachnoid hemorrhage is visible (coronal  image 62). But when compared to yesterday morning, all basilar cisterns appear worse (sagittal image 33 now). And cerebellar tonsillar ectopia is difficult to exclude. Vascular: Calcified atherosclerosis at the skull base. Skull: Stable and intact. Sinuses/Orbits: Visualized paranasal sinuses and mastoids are stable and well aerated. Other: Left nasoenteric tube in place. Intubated. Broad-based left scalp, supraorbital, lateral periorbital soft tissue hematoma. Globes and intraorbital soft tissues remain negative. IMPRESSION: 1. Extensive acute intracranial hemorrhage, with large volume bilateral SAH not significantly changed from last night. 2. The dominant progressive finding since 0321 hours yesterday is Loss of basilar cistern patency, which is favored to be a combination of increased posterior fossa subarachnoid hemorrhage, plus central and downward intracranial mass effect. 3. Moderate to large volume IVH and ventricle size/configuration not significantly changed from 0955 hours yesterday. Salient findings were communicated to Dr. Vanessa at 5:46 am on 2023/11/24 by text page via the [facility] messaging system. Electronically Signed: By: VEAR Hurst M.D. On: 2023-11-24 05:47   CT HEAD WO CONTRAST ( ) Result Date: 10/24/2023 EXAM: CT HEAD WITHOUT CONTRAST 10/24/2023 09:50:00 PM TECHNIQUE: CT of the head was performed without the administration of intravenous contrast. Automated exposure control, iterative reconstruction, and/or weight based adjustment of the mA/kV was utilized to reduce the radiation dose to as low as reasonably achievable. COMPARISON: 10/24/2023 09:12 AM CLINICAL HISTORY: Mental status change, unknown cause; Stroke, follow up. FINDINGS: BRAIN AND VENTRICLES: Redemonstration of large area of intraparenchymal hemorrhage in the posterior right hemisphere. New subarachnoid blood covering most of the left hemisphere. Hydrocephalus of the lateral and third ventricles has slightly worsened. Leftward  midline shift has slightly increased to approximately 5 mm. The basal cisterns are effaced and there is greater sulcal crowding of both hemispheres. ORBITS: No acute abnormality. SINUSES: No acute abnormality. SOFT TISSUES AND SKULL: Left frontal scalp hematoma is unchanged. No skull fracture. IMPRESSION: 1. New subarachnoid blood covering most of the left hemisphere. 2. Slightly worsened hydrocephalus of the lateral and third ventricles. 3. Slightly increased leftward midline shift to approximately 5 mm. Case discussed with Dr. Salman Khaliqdina at 10:13 PM 10/24/23. Electronically signed by: Franky Stanford MD 10/24/2023 10:15 PM EDT RP Workstation: [id number]   DG CHEST PORT 1 VIEW Result Date: 10/24/2023 CLINICAL DATA:  [id number] Endotracheally intubated [id number] Encounter for imaging study to confirm orogastric (OG) tube placement [id number] EXAM: PORTABLE CHEST 1 VIEW COMPARISON:  CT chest abdomen pelvis 10/24/2023 FINDINGS: Endotracheal tube with tip terminating 8 cm above the carina. Enteric tube coursing below the hemidiaphragm with tip overlying the gastric lumen and side port overlying the expected region of the gastroesophageal junction. The heart and mediastinal contours are unchanged. Atherosclerotic plaque. Aortic valve replacement. Right middle lobe airspace opacity. No pulmonary edema. No pleural effusion. No pneumothorax. Nonobstructive bowel gas pattern. No acute osseous abnormality. IMPRESSION: 1. Right middle lobe airspace opacity. 2. Nonobstructive bowel gas pattern. 3. Endotracheal tube with tip terminating 8 cm above the carina. Consider advancing by 2 cm. 4. Enteric tube coursing below the hemidiaphragm with tip overlying the gastric lumen and side port overlying the expected region of the gastroesophageal junction. Consider advancing by 2 cm. 5.  Aortic Atherosclerosis (ICD10-I70.0). Electronically Signed   By: Morgane  Naveau M.D.   On: 10/24/2023 21:17   DG  Abd Portable 1V Result Date:  10/24/2023 EXAM: 1 VIEW XRAY OF THE ABDOMEN 10/24/2023 09:04:00 PM COMPARISON: None available. CLINICAL HISTORY: [id number] Endotracheally intubated [id number]; [id number] Encounter for imaging study to confirm orogastric (OG) tube placement [id number]. Reason for exam: Encounter for OG tube placement FINDINGS: BOWEL: Mildly prominent loops of small bowel in the lower abdomen. Nonobstructive bowel gas pattern. SOFT TISSUES: No abnormal calcifications or opaque urinary calculi. BONES: No acute osseous abnormality. LINES AND TUBES: Enteric tube terminates in the gastric fundus with side port just below the GE junction, satisfactory position. IMPRESSION: 1. Satisfactory position of the enteric tube. Electronically signed by: Pinkie Pebbles MD 10/24/2023 09:12 PM EDT RP Workstation: [id number]   CT HEAD POST STROKE FOLLOWUP/TIMED/STAT READ Result Date: 10/24/2023 CLINICAL DATA:  78 year old male status post fall and intracranial hemorrhage. Intra-axial right hemisphere hemorrhage, on Eliquis . Intraventricular extension, and positive CTA spot sign. EXAM: CT HEAD WITHOUT CONTRAST TECHNIQUE: Contiguous axial images were obtained from the base of the skull through the vertex without intravenous contrast. RADIATION DOSE REDUCTION: This exam was performed according to the departmental dose-optimization program which includes automated exposure control, adjustment of the mA and/or kV according to patient size and/or use of iterative reconstruction technique. COMPARISON:  Plain head CT 0321 hours today. FINDINGS: Brain: Extensive heterogeneous hemorrhage in the posterior right hemisphere, epicenter at the right parietal lobe. Intra-axial blood appears less circumscribed now. And evidence of new small foci of superior right hemisphere hemorrhagic contusion (such as on series 5, image 36 measuring 12 mm. Difficult to exclude similar new contusion in the right mesial temporal lobe on series 5, image 38, although intraventricular blood has  increased in the right temporal horn, confounding the appearance. Overall size and configuration of the right hemisphere hematoma not significantly changed. However, substantially increased right greater than left subarachnoid hemorrhage, including suspected isodense subarachnoid blood in the basilar cisterns (series 3, image 14). Additionally, moderate to large volume intraventricular extension of blood. Ventricle size and configuration not significantly changed. Mild mass effect on the right lateral ventricle and leftward midline shift of 1-2 mm appears stable. No tonsillar herniation. No left hemisphere or posterior fossa intra-axial hemorrhage identified. No definite subdural blood. Vascular: Residual intravascular contrast. Skull: No fracture identified. Sinuses/Orbits: Visualized paranasal sinuses and mastoids are stable and well aerated. Other: Broad-based left anterior convexity scalp hematoma. Underlying calvarium appears intact. Stable orbits soft tissues. IMPRESSION: 1. Substantially increased since 0321 hours bilateral Subarachnoid Hemorrhage, including suspected isodense SAH throughout the posterior fossa. 2. Large right hemisphere intra-axial hematoma is less circumscribed now, but has not significantly changed. 3. But probable new small hemorrhagic contusions also in the right hemisphere including the superior frontal gyrus. 4. Moderate to large volume of IVH. Stable vents size and configuration. 5. Stable midline shift of 1-2 mm. 6. No skull fracture identified. Broad-based left anterior convexity scalp hematoma. Salient findings were communicated to Dr. Rosemarie at [phone] hours on 10/24/2023 by text page via the [employer] messaging system. Electronically Signed   By: VEAR Hurst M.D.   On: 10/24/2023 09:27    Vitals:   11-03-23 0615 11/03/23 0630 11-03-23 0645 11-03-23 0700  BP: (!) 124/55 (!) 123/54 (!) 120/55 (!) 117/55  Pulse: 95 95 92 93  Resp: (!) 27 (!) 27 (!) 27 (!) 27  Temp:      TempSrc:       SpO2: 96% 96% 97% 97%  Weight:      Height:        PHYSICAL EXAM General:  Critically ill patient in no acute distress CV: Regular rate and rhythm on monitor Respiratory:  Patient now intubated and sedated, full support  NEURO:  Sedated on multiple medications. No noxious stimuli used for assessment, as patient has transitioned to comfort care.  Brainstem reflexes intact.  UTA facial symmetry, voice/speech, tongue midline due to intubation.  Does not open eyes to voice, does not follow commands.     ASSESSMENT/PLAN  [name] is a 78 y.o. male with hx of CAD, GERD, DM2, Afibb on eliquis , who was last seen normal at 2130 on 10/23/23 when he went to bed. He got up around 0140 and walked over to the bathroom. Per wife, he was having difficulty with moving his left arm and seemed to not be recognizing the left arm.  He had a fall with complete left-sided weakness and unable to stand up.  He hit his head pretty hard with the left forehead scalp hematoma.  He was brought in as a code stroke as well as a trauma alert.  CT head without contrast demonstrates a large 6.7 cm intraparenchymal hemorrhage with intraventricular extension and 3 mm leftward midline shift.  CT angio demonstrates spot sign with noted contrast extravasation left hematoma.  Patient took his Eliquis  at 8:30 PM 7/16, in hypertensive emergency with systolic of over 809d at presentation.  He was not deemed a candidate for TNKase or thrombectomy he is on Eliquis  and he has a ICH. ICH score of 1 for IVH.  NIH on admission: 17.  Intracerebral Hemorrhage with IV extension, cerebral edema, midline shift:  right parietal s/p Eliquis  reversal with Andexxa  Etiology: Pending continued workup, likely due to hypertensive emergency and oral anticoagulation status post fall CT Head without contrast(Personally reviewed): Large 6.7 cm intraparenchymal hemorrhage centered in the right parietal lobe with intraventricular extension and 3  mm of leftward midline shift. CT angio Head and Neck with contrast(Personally reviewed): Positive for spot sign with active contrast extravasation. Repeat CTH: Substantially increased since 0321 hours bilateral Subarachnoid Hemorrhage, including suspected isodense SAH throughout the posterior fossa. Large right hemisphere intra-axial hematoma is less circumscribed now, not significantly changed.  Probable new small hemorrhagic contusions also in the right hemisphere including the superior frontal gyrus. Moderate to large volume of IVH. Stable vents size and configuration. Stable midline shift of 1-2 mm. No skull fracture identified. Broad-based left anterior convexity scalp hematoma CTH 7/18 PM: New subarachnoid blood covering most of left hemisphere Slightly worsened hydrocephalus of the lateral and third ventricles. Slightly increased leftward midline shift to approximately 5 mm. [facility] 7/19AM  Extensive acute intracranial hemorrhage, with large volume bilateral SAH not significantly changed from last night. The dominant progressive finding since 0321 hours yesterday is Loss of basilar cistern patency, which is favored to be a combination of increased posterior fossa subarachnoid hemorrhage, plus central and downward intracranial mass effect. Moderate to large volume IVH and ventricle size/configuration not significantly changed from 0955 hours yesterday. 2D Echo 4/72025: EF 65-70%, Mild LVH, Grade II diastolic dysfunction, Moderately dilated LA/RA, Mild MVR, Noted Aortic Dilatation. (Patient with known history of Afib) LDL 80 HgbA1c 6.3 VTE prophylaxis -SCDs Eliquis  prior to admission, now on No antithrombotic due to ICH Therapy recommendations:  n/a Disposition: Transition to Comfort Care  Cerebral Edema Midline Shift Discontinue 3% Hypertonic Salne at 75ml/hr Discontinue Na checks  Acute Respiratory Failure Secondary to large R ICH CCM assistance appreciated Terminal extubation  per family Transition to comfort care  Atrial fibrillation Home Meds: Eliquis   5mg  BID, Metoprolol   Continue metoprolol  Continue telemetry monitoring, discontinue with terminal extubation Hold anticoagulation due to large ICH  Hypertension Home meds:  none Unstable Cleviprex  gtt., wean as able. IV labetalol  and hydralazine  as needed Wean down drips and BP parameters, as transitioned to comfort care  Hyperlipidemia Home meds: Crestor  40 mg LDL 80, goal < 70 No statin due to comfort care transition  Diabetes type II Controlled Home meds: Metformin  500 mg daily, Jardiance  12.5 mg daily HgbA1c 6.3, goal < 7.0 Discontinue CBGs and SSI  Tobacco Abuse Patient is a former cigar smoker Patient formally used chewing tobacco and snuff  Dysphagia Patient has post-stroke dysphagia, SLP consulted  Other Stroke Risk Factors Family hx stroke (paternal grandfather) Coronary artery disease Congestive heart failure OSA  Other Active Problems History of interstitial lung disease History of anxiety Home medication: Hydroxyzine  10 mg History of dementia Home medication: Namenda   Hospital day # 1  Pt seen by Neuro NP/APP with MD. Note/plan to be edited by MD as needed.    Rocky JAYSON Likes, DNP, AGACNP-BC [facility] Please use AMION for contact information & EPIC for messaging.  ATTENDING NOTE: I reviewed above note and agree with the assessment and plan. Pt was seen and examined.   Multiple family members are at the bedside. Pt intubated, unresponsive, no movement, eyes closed. Again discussed GOC with family and confirmed comfort care process, answered all family questions. Pt will proceed with comfort care measures, extubation, once family ready.   For detailed assessment and plan, please refer to above as I have made changes wherever appropriate.   Ary Cummins, MD PhD Stroke Neurology 10/29/23 2:29 PM

## 2023-11-07 NOTE — Progress Notes (Signed)
 Pt extubated to comfort care per md order with RN and family at bedside.

## 2023-11-07 NOTE — Progress Notes (Signed)
 SLP Cancellation Note  Patient Details Name: Kenneth [name] MRN: [id number] DOB: October 29, 1945   Cancelled treatment:       Reason Eval/Treat Not Completed: Medical issues which prohibited therapy (pt intubated yesterday). Will f/u as able.     Leita SAILOR., M.A. CCC-SLP Acute Rehabilitation Services Office: [phone]  Secure chat preferred  Nov 23, 2023, 7:42 AM

## 2023-11-07 NOTE — Progress Notes (Signed)
 Dr. Vanessa notified of sodium of 152.

## 2023-11-07 NOTE — Death Summary Note (Addendum)
  Patient ID: LEUL NARRAMORE MRN: [id number] DOB/AGE: Apr 26, 1945 78 y.o.  Admit date: 11/10/23 Death date: November 11, 2023 1351  Admission Diagnoses: ICH  Cause of Death:  Large Right Intracerebral Hemorrhage with IV extension, cerebral edema, midline shift   Pertinent Medical Diagnosis: Principal Problem:   ICH (intracerebral hemorrhage) (HCC) Active Problems:   Acute hypoxic respiratory failure [facility])   Hospital Course:  7/17 pt presented via EMS, status post fall, with left side weakness and neglect, hypertensive to 190s. CT shows large 6.7 cm IPH right parietal lobe with IV extension and 3 mm midline shift. Placed on 3%. Requiring Cleviprex  gtt and IV PRNs for blood pressure control. 2023/11/10 Repeat CT shows increase in bilateral subarachnoid, moderate to large IVH with stable midline shift. He was later intubated d/t increased lethargy and failure to protect airway. Repeat CT Head notable for worsening midling shift, worsening lateral and third ventricle hydrocephalus and worsening SAH overlying the left hemisphere. 11-11-23: AM CTH shows progression of bleed with evidence of clotting in basal cisterns. After discussion with Neurology overnight and CCM, family decided to transition to comfort care. Patient terminally extubated at 1206, and expired at 1351  Signed: Rocky JAYSON Likes, NP November 11, 2023, 1:57 PM   ATTENDING NOTE: I reviewed above note and agree with the assessment and plan.   For detailed assessment, please refer to above as I have made changes wherever appropriate.   Ary Cummins, MD PhD Stroke Neurology 11/04/2023 10:51 AM

## 2023-11-07 NOTE — Progress Notes (Signed)
 Pt deceased, this RN unable to waste medications in pyxis. Wasted 75 mL of fentanyl  and 75 mL of versed  with Vernell Plumb, RN.

## 2023-11-07 NOTE — IPAL (Signed)
  Interdisciplinary Goals of Care Family Meeting   Date carried out: November 05, 2023  Location of the meeting: Bedside  Member's involved: Physician, Bedside Registered Nurse, and Family Member or next of kin  Durable Power of Attorney or acting medical decision maker: wife    Discussion: We discussed goals of care for Kenneth [name] .  I met with Mr. Desroches wife, daughter, and granddaughter to discuss his care. He had progression of his bleed overnight and this morning has evidence of clotting in his basal cisterns. He had progressive lethargy and respiratory failure overnight. Family reports he has always been active and would never want to be bedbound and dependent on others. His wife thinks pursuing comfort-focused care would be best for him in this situation. They have a few additional family members and 2 ministers who are coming to visit today, then they are planning for terminal extubation. We discussed deescalating his care in the interim to ensure we are not causing discomfort.   Code status:   Code Status: Do not attempt resuscitation (DNR) - Comfort care   Disposition: In-patient comfort care  Time spent for the meeting: 15 min.    Leita SHAUNNA Gaskins, DO  11-05-2023, 7:48 AM

## 2023-11-07 DEATH — deceased

## 2024-06-17 ENCOUNTER — Other Ambulatory Visit (HOSPITAL_COMMUNITY)

## 2024-06-17 ENCOUNTER — Ambulatory Visit: Admitting: Physician Assistant
